# Patient Record
Sex: Male | Born: 1945 | Hispanic: No | Marital: Married | Smoking: Current every day smoker
Health system: Southern US, Community
[De-identification: ages and names within clinical notes are randomized; demographics above are authoritative.]

## PROBLEM LIST (undated history)

## (undated) DIAGNOSIS — D649 Anemia, unspecified: Secondary | ICD-10-CM

## (undated) DIAGNOSIS — N390 Urinary tract infection, site not specified: Secondary | ICD-10-CM

## (undated) DIAGNOSIS — I350 Nonrheumatic aortic (valve) stenosis: Secondary | ICD-10-CM

## (undated) DIAGNOSIS — N400 Enlarged prostate without lower urinary tract symptoms: Secondary | ICD-10-CM

## (undated) DIAGNOSIS — F32A Depression, unspecified: Secondary | ICD-10-CM

## (undated) DIAGNOSIS — F419 Anxiety disorder, unspecified: Secondary | ICD-10-CM

## (undated) DIAGNOSIS — E785 Hyperlipidemia, unspecified: Secondary | ICD-10-CM

## (undated) DIAGNOSIS — I2510 Atherosclerotic heart disease of native coronary artery without angina pectoris: Secondary | ICD-10-CM

## (undated) DIAGNOSIS — I10 Essential (primary) hypertension: Secondary | ICD-10-CM

## (undated) DIAGNOSIS — Z950 Presence of cardiac pacemaker: Secondary | ICD-10-CM

## (undated) DIAGNOSIS — I351 Nonrheumatic aortic (valve) insufficiency: Secondary | ICD-10-CM

## (undated) DIAGNOSIS — M1990 Unspecified osteoarthritis, unspecified site: Secondary | ICD-10-CM

## (undated) DIAGNOSIS — I639 Cerebral infarction, unspecified: Secondary | ICD-10-CM

## (undated) HISTORY — DX: Hyperlipidemia, unspecified: E78.5

## (undated) HISTORY — DX: Nonrheumatic aortic (valve) stenosis: I35.0

## (undated) HISTORY — DX: Nonrheumatic aortic (valve) insufficiency: I35.1

## (undated) HISTORY — DX: Cerebral infarction, unspecified: I63.9

## (undated) HISTORY — DX: Benign prostatic hyperplasia without lower urinary tract symptoms: N40.0

## (undated) HISTORY — DX: Essential (primary) hypertension: I10

---

## 2005-12-19 DIAGNOSIS — I639 Cerebral infarction, unspecified: Secondary | ICD-10-CM

## 2005-12-19 HISTORY — DX: Cerebral infarction, unspecified: I63.9

## 2006-01-01 ENCOUNTER — Inpatient Hospital Stay (HOSPITAL_COMMUNITY): Admission: EM | Admit: 2006-01-01 | Discharge: 2006-01-03 | Payer: Self-pay | Admitting: *Deleted

## 2014-04-18 DIAGNOSIS — I351 Nonrheumatic aortic (valve) insufficiency: Secondary | ICD-10-CM

## 2014-04-18 HISTORY — DX: Nonrheumatic aortic (valve) insufficiency: I35.1

## 2017-04-06 ENCOUNTER — Other Ambulatory Visit: Payer: Self-pay | Admitting: Family Medicine

## 2017-04-06 DIAGNOSIS — Z136 Encounter for screening for cardiovascular disorders: Secondary | ICD-10-CM

## 2017-04-13 ENCOUNTER — Encounter: Payer: Self-pay | Admitting: Cardiology

## 2017-04-13 ENCOUNTER — Ambulatory Visit (INDEPENDENT_AMBULATORY_CARE_PROVIDER_SITE_OTHER): Payer: Medicare Other | Admitting: Cardiology

## 2017-04-13 DIAGNOSIS — I10 Essential (primary) hypertension: Secondary | ICD-10-CM

## 2017-04-13 DIAGNOSIS — I4510 Unspecified right bundle-branch block: Secondary | ICD-10-CM | POA: Diagnosis not present

## 2017-04-13 DIAGNOSIS — R011 Cardiac murmur, unspecified: Secondary | ICD-10-CM | POA: Diagnosis not present

## 2017-04-13 DIAGNOSIS — I350 Nonrheumatic aortic (valve) stenosis: Secondary | ICD-10-CM | POA: Insufficient documentation

## 2017-04-13 HISTORY — DX: Nonrheumatic aortic (valve) stenosis: I35.0

## 2017-04-13 NOTE — Patient Instructions (Signed)
SCHEDULE AT [address]  [address] Your physician has requested that you have an echocardiogram. Echocardiography is a painless test that uses sound waves to create images of your heart. It provides your doctor with information about the size and shape of your heart and how well your heart's chambers and valves are working. This procedure takes approximately one hour. There are no restrictions for this procedure.     NO OTHER CHANGES    Your physician recommends that you schedule a follow-up appointment in 1 MONTH WITH DR HARDING.

## 2017-04-13 NOTE — Progress Notes (Signed)
PCP: No primary care provider on file.  Clinic Note: Chief Complaint  Patient presents with  . New Patient (Initial Visit)    Pt states no Sx.   . Abnormal ECG  . Heart Murmur    HPI: Kyle Matthews is a 71 y.o. male who is being seen today for the evaluation of Abn EKG & Heart Murmur at the request of Kyle Palmer, MD.  Kyle Matthews was seen on April 19 by Dr. Paulino Rily for an initial clinic visit. He had an EKG that was read as abnormal LAFB.  Recent Hospitalizations: None  Studies Reviewed: Clinic EKG reviewed   Rate: 79 ;  Rhythm: normal sinus rhythm and left axis deviation (-39 is borderline LAFB but not truly), RBBB repolarization changes.;   Narrative Interpretation: No prior study.  Interval History: Kennen presents here today for cardiac evaluation for an abnormal EKG without any significant symptoms as well as a murmur. He is currently pending a ultrasound for AAA. He knowledge is that he doesn't do any routine exercise, but is somewhat active. He denies any significant resting or exertional chest tightness or pressure. No PND, orthopnea or edema. No lightheadedness, dizziness, wooziness or syncope/near-syncope, TIA/amaurosis fugax. He's not felt any irregular rapid heartbeats either fast or slow. He is not aware that any embolic source for his stroke in 2007 was found. He apparently also had a TIA back in 98. No claudication.  ROS: A comprehensive was performed. Review of Systems  Constitutional: Negative for malaise/fatigue.  HENT: Negative for congestion and sinus pain.   Respiratory: Negative for shortness of breath and wheezing.   Gastrointestinal: Negative for blood in stool, heartburn and melena.  Genitourinary: Negative for hematuria.  Musculoskeletal: Negative for joint pain.  Skin: Negative.   Neurological: Negative for dizziness, focal weakness, seizures and loss of consciousness.  Psychiatric/Behavioral: Negative for depression and memory loss. The patient is  not nervous/anxious and does not have insomnia.   All other systems reviewed and are negative.   I have reviewed and (if needed) updated the patient's problem list, medications, allergies, past medical and surgical history, social and family history.  Past Medical History:  Diagnosis Date  . BPH (benign prostatic hyperplasia)    Status post biopsy in 2009. - w/ LUTS  . Essential hypertension   . Heart murmur   . Hyperlipidemia    On Crestor - followed by PCP  . Stroke [facility]) 2007    History reviewed. No pertinent surgical history.  Current Meds  Medication Sig  . amLODipine-atorvastatin (CADUET) 10-40 MG tablet Take 1 tablet by mouth daily.  Marland Kitchen aspirin 325 MG tablet Take 325 mg by mouth daily.  . Multiple Vitamins-Minerals (MULTIVITAL PO) Take by mouth daily.  . rosuvastatin (CRESTOR) 40 MG tablet Take 40 mg by mouth daily.    Allergies  Allergen Reactions  . Tetanus Toxoids     Social History   Social History  . Marital status: Married    Spouse name: N/A  . Number of children: 1  . Years of education: N/A   Occupational History  . Retired     Previously worked in Community education officer   Social History Main Topics  . Smoking status: Current Every Day Smoker    Packs/day: 1.50    Years: 24.00    Types: Cigarettes  . Smokeless tobacco: Never Used  . Alcohol use 3.6 oz/week    6 Cans of beer per week  . Drug use: No  . Sexual activity: Not Currently  Other Topics Concern  . None   Social History Narrative   Married father of 1. Lives with his wife of 49 years. He is a retired Marine scientist used to work for [employer]   4 cups of coffee a day.    No structured exercise regimen       family history includes Cancer in his maternal grandmother and paternal grandmother; Coronary artery disease (age of onset: 30) in his mother; Healthy (age of onset: 68) in his brother; Heart disease (age of onset: 6) in his maternal grandfather; Lung cancer in his father;  Pancreatic cancer (age of onset: 43) in his sister; Stroke in his paternal grandfather; Thyroid disease in his sister.  Wt Readings from Last 3 Encounters:  04/13/17 169 lb (76.7 kg)    PHYSICAL EXAM BP 137/77   Pulse 76   Ht  (1.6 m)   Wt 169 lb (76.7 kg)   BMI 29.94 kg/m  General appearance: alert, cooperative, appears stated age, no distress and Borderline obese HEENT: [place]/AT, EOMI, MMM, anicteric sclera Neck: no adenopathy, no carotid bruit and no JVD Lungs: clear to auscultation bilaterally, normal percussion bilaterally and non-labored Heart: regular rate and rhythm, S1 and S2 normal, no click, rub or gallop ; nondistended PMI. 3/6 SEM at upper sternal border radiates toward carotids. Also heard throughout. Somewhat early peaking C-D. Abdomen: soft, non-tender; bowel sounds normal; no masses,  no organomegaly; no HJR Extremities: extremities normal, atraumatic, no cyanosis, or edema Pulses: 2+ and symmetric;  Skin: mobility and turgor normal, no evidence of bleeding or bruising, no lesions noted and temperature normal Neurologic: Mental status: Alert, oriented, thought content appropriate; pleasant mood and affect Cranial nerves: normal (II-XII grossly intact)    Adult ECG Report Not checked today  Other studies Reviewed: Additional studies/ records that were reviewed today include:  Recent Labs:  None available   ASSESSMENT / PLAN: Problem List Items Addressed This Visit    Essential hypertension (Chronic)    Blood pressure is well-controlled today he is on amlodipine. It looks like his medications list to statins with Caduet having, patient of amlodipine and/or statin along with rosuvastatin separately. Will need to clarify with him on follow-up appointment.      Relevant Medications   amLODipine-atorvastatin (CADUET) 10-40 MG tablet   rosuvastatin (CRESTOR) 40 MG tablet   aspirin 325 MG tablet   RBBB    RBBB with borderline LAFB/LAD. These are conduction  abnormalities that I explained to him in detail as far as AV nodal to ventricular conduction. I explained that right bundle branch block is a usually a benign finding. LAFB in the setting of ischemic symptoms is concerning for ischemia, however at present with no symptoms, it is likely not ischemic. Plan for now recheck 2-D echo to evaluate for any structural abnormalities. If he were to have any significant symptoms of exertional chest discomfort or dyspnea or his echo is abnormal with regional wall motion abnormality is, we would then want to do an ischemic evaluation such as a coronary CTA/calcium score.      Relevant Medications   amLODipine-atorvastatin (CADUET) 10-40 MG tablet   rosuvastatin (CRESTOR) 40 MG tablet   aspirin 325 MG tablet   Other Relevant Orders   ECHOCARDIOGRAM COMPLETE   Systolic ejection murmur    The location of the murmur would and further is probably an aortic murmur. Relatively early peaking without the Lipitor for which suggested is probably not significant however he is far as  I can tell he is not had a prior echocardiogram to tell us his anatomy. Plan: Check 2-D echocardiogram         Current medicines are reviewed at length with the patient today. (+/- concerns) n/a The following changes have been made: n/a  Patient Instructions  SCHEDULE AT [address]  [address] Your physician has requested that you have an echocardiogram. Echocardiography is a painless test that uses sound waves to create images of your heart. It provides your doctor with information about the size and shape of your heart and how well your heart's chambers and valves are working. This procedure takes approximately one hour. There are no restrictions for this procedure.     NO OTHER CHANGES    Your physician recommends that you schedule a follow-up appointment in 1 MONTH WITH DR Colbie Sliker.    Studies Ordered:   Orders Placed This Encounter  Procedures  .  ECHOCARDIOGRAM COMPLETE      Bryan Lemma, M.D., M.S. Interventional Cardiologist   Pager # [phone] Phone # [phone] [address] [place], [place] [address]

## 2017-04-15 ENCOUNTER — Encounter: Payer: Self-pay | Admitting: Cardiology

## 2017-04-15 DIAGNOSIS — I10 Essential (primary) hypertension: Secondary | ICD-10-CM | POA: Insufficient documentation

## 2017-04-15 NOTE — Assessment & Plan Note (Signed)
The location of the murmur would and further is probably an aortic murmur. Relatively early peaking without the Lipitor for which suggested is probably not significant however he is far as I can tell he is not had a prior echocardiogram to tell us his anatomy. Plan: Check 2-D echocardiogram

## 2017-04-15 NOTE — Assessment & Plan Note (Signed)
RBBB with borderline LAFB/LAD. These are conduction abnormalities that I explained to him in detail as far as AV nodal to ventricular conduction. I explained that right bundle branch block is a usually a benign finding. LAFB in the setting of ischemic symptoms is concerning for ischemia, however at present with no symptoms, it is likely not ischemic. Plan for now recheck 2-D echo to evaluate for any structural abnormalities. If he were to have any significant symptoms of exertional chest discomfort or dyspnea or his echo is abnormal with regional wall motion abnormality is, we would then want to do an ischemic evaluation such as a coronary CTA/calcium score.

## 2017-04-15 NOTE — Assessment & Plan Note (Signed)
Blood pressure is well-controlled today he is on amlodipine. It looks like his medications list to statins with Caduet having, patient of amlodipine and/or statin along with rosuvastatin separately. Will need to clarify with him on follow-up appointment.

## 2017-04-17 ENCOUNTER — Ambulatory Visit
Admission: RE | Admit: 2017-04-17 | Discharge: 2017-04-17 | Disposition: A | Discharge status: Home / Self Care | Payer: Medicare Other | Source: Ambulatory Visit | Attending: Family Medicine | Admitting: Family Medicine

## 2017-04-17 DIAGNOSIS — Z136 Encounter for screening for cardiovascular disorders: Secondary | ICD-10-CM

## 2017-04-18 DIAGNOSIS — I350 Nonrheumatic aortic (valve) stenosis: Secondary | ICD-10-CM

## 2017-04-18 HISTORY — DX: Nonrheumatic aortic (valve) stenosis: I35.0

## 2017-04-28 ENCOUNTER — Ambulatory Visit (HOSPITAL_COMMUNITY): Payer: Medicare Other | Attending: Cardiology

## 2017-04-28 ENCOUNTER — Other Ambulatory Visit: Payer: Self-pay

## 2017-04-28 DIAGNOSIS — I4510 Unspecified right bundle-branch block: Secondary | ICD-10-CM | POA: Insufficient documentation

## 2017-04-28 DIAGNOSIS — R9431 Abnormal electrocardiogram [ECG] [EKG]: Secondary | ICD-10-CM | POA: Insufficient documentation

## 2017-04-28 DIAGNOSIS — I352 Nonrheumatic aortic (valve) stenosis with insufficiency: Secondary | ICD-10-CM | POA: Insufficient documentation

## 2017-04-28 HISTORY — PX: TRANSTHORACIC ECHOCARDIOGRAM: SHX275

## 2017-05-01 ENCOUNTER — Telehealth: Payer: Self-pay | Admitting: *Deleted

## 2017-05-01 NOTE — Telephone Encounter (Addendum)
-----   Message from [name], MD sent at 04/30/2017  6:06 PM EDT ----- Echo shows essentially upper normal EF of C5-70% with no wall motion abnormalities and only grade 1 diastolic dysfunction. There was suggestion of at least moderate aortic stenosis with moderate regurgitation --> more significant than I thought it would be. We will need to monitor this closely, and blood pressure control is vitally important.  [name], MD  Left message for pt to call

## 2017-05-01 NOTE — Telephone Encounter (Signed)
Spoke with pt, aware of echo results. 

## 2017-05-19 ENCOUNTER — Ambulatory Visit (INDEPENDENT_AMBULATORY_CARE_PROVIDER_SITE_OTHER): Payer: Medicare Other | Admitting: Cardiology

## 2017-05-19 VITALS — BP 148/77 | HR 73 | Ht 63.0 in | Wt 167.8 lb

## 2017-05-19 DIAGNOSIS — I4510 Unspecified right bundle-branch block: Secondary | ICD-10-CM | POA: Diagnosis not present

## 2017-05-19 DIAGNOSIS — R0989 Other specified symptoms and signs involving the circulatory and respiratory systems: Secondary | ICD-10-CM | POA: Diagnosis not present

## 2017-05-19 DIAGNOSIS — I350 Nonrheumatic aortic (valve) stenosis: Secondary | ICD-10-CM | POA: Diagnosis not present

## 2017-05-19 DIAGNOSIS — Z720 Tobacco use: Secondary | ICD-10-CM

## 2017-05-19 DIAGNOSIS — E785 Hyperlipidemia, unspecified: Secondary | ICD-10-CM | POA: Diagnosis not present

## 2017-05-19 DIAGNOSIS — I10 Essential (primary) hypertension: Secondary | ICD-10-CM

## 2017-05-19 MED ORDER — CARVEDILOL 3.125 MG PO TABS: 3.1250 mg | ORAL_TABLET | Freq: Two times a day (BID) | ORAL | 6 refills | Status: DC

## 2017-05-19 NOTE — Patient Instructions (Signed)
SCHEDULE AT [address] Your physician has requested that you have a carotid duplex. This test is an ultrasound of the carotid arteries in your neck. It looks at blood flow through these arteries that supply the brain with blood. Allow one hour for this exam. There are no restrictions or special instructions.    SCHEDULE AT [address] FOR MAY 2019 Your physician has requested that you have an echocardiogram. Echocardiography is a painless test that uses sound waves to create images of your heart. It provides your doctor with information about the size and shape of your heart and how well your heart's chambers and valves are working. This procedure takes approximately one hour. There are no restrictions for this procedure.   MEDICATION ADDITION START COREG  ( CARVEDILOL) 3.125 MG ONE TABLET TWICE A DAY  Your physician recommends that you schedule a follow-up appointment in 3-4 MONTHS WITH B. STRADER PA.    Your physician wants you to follow-up in 12 MONTHS WITH DR HARDING AFTER ECHO IS COMPLETED. You will receive a reminder letter in the mail two months in advance. If you don't receive a letter, please call our office to schedule the follow-up appointment.  If you need a refill on your cardiac medications before your next appointment, please call your pharmacy.

## 2017-05-19 NOTE — Progress Notes (Signed)
PCP: Mila PalmerWolters, Sharon, MD  Clinic Note: Chief Complaint  Patient presents with  . Follow-up    1 mo rov. discuss echo.  . Cardiac Valve Problem    New diagnosis moderate AI/AS    HPI: Kyle Matthews is a 71 y.o. male with a PMH below who presents today for One-month follow-up to evaluate A newly noted heart murmur and LAFB. Echocardiogram showed moderate aortic stenosis with moderate regurgitation  Kyle Matthews was Initially seen on 04/13/2017 - his EKG did not show LAFB at that time but he did have a right bundle branch block. He was evaluated with cardiogram noted below which showed moderate aortic stenosis with moderate regurgitation.  Recent Hospitalizations: None  Studies Personally Reviewed - (if available, images/films reviewed: From Epic Chart or Care Everywhere)  2-D echo 04/28/2017: EF 65-70%. GR 1 DD. No regional wall motion abnormality. Moderate aortic stenosis with moderate regurgitation. Mean gradient 34 mmHg.  Abdominal aorta ultrasound: No evidence of AAA  Interval History: Kyle Matthews presents today for follow-up really relatively asymptomatic. He has a slight rash on both legs that is somewhat punctate in nature. He has not had any new medications, and thinks it may be from working out in the yard. He has not had any significant chest tightness or pressure with rest or exertion. No PND, orthopnea or edema. No palpitations, lightheadedness, dizziness, weakness or syncope/near syncope. No TIA/amaurosis fugax symptoms. No claudication.  ROS: A comprehensive was performed. Review of Systems  Constitutional: Negative for malaise/fatigue.  Respiratory: Negative for cough, shortness of breath and wheezing.   Musculoskeletal: Negative for joint pain and myalgias.  Skin: Positive for rash (Bilateral lower extremity fine punctate erythematous).  Neurological: Negative for dizziness and focal weakness.  Psychiatric/Behavioral: Negative for depression and memory loss. The patient is  not nervous/anxious.   All other systems reviewed and are negative.  I have reviewed and (if needed) personally updated the patient's problem list, medications, allergies, past medical and surgical history, social and family history.   Past Medical History:  Diagnosis Date  . BPH (benign prostatic hyperplasia)    Status post biopsy in 2009. - w/ LUTS  . Essential hypertension   . Hyperlipidemia    On Crestor - followed by PCP  . Moderate aortic regurgitation 04/2014   Moderate AS and AR - on echocardiogram  . 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 Stroke [facility] 2007   No true etiology noted    Past Surgical History:  Procedure Laterality Date  . TRANSTHORACIC ECHOCARDIOGRAM  04/28/2017    EF 65-70%. GR 1 DD. No regional wall motion abnormality. Moderate aortic stenosis with moderate regurgitation. Mean gradient 34 mmHg.    Current Meds  Medication Sig  . amLODipine-benazepril (LOTREL) 10-40 MG capsule Take 1 capsule by mouth daily.  Marland Kitchen. aspirin 325 MG tablet Take 325 mg by mouth daily.  . Multiple Vitamins-Minerals (MULTIVITAL PO) Take by mouth daily.  . rosuvastatin (CRESTOR) 40 MG tablet Take 40 mg by mouth daily.    Allergies  Allergen Reactions  . Tetanus Toxoids     Social History   Social History  . Marital status: Married    Spouse name: N/A  . Number of children: 1  . Years of education: N/A   Occupational History  . Retired     Previously worked in Community education officerinsurance   Social History Main Topics  . Smoking status: Current Every Day Smoker    Packs/day: 1.50    Years: 24.00    Types:  Cigarettes  . Smokeless tobacco: Never Used  . Alcohol use 3.6 oz/week    6 Cans of beer per week  . Drug use: No  . Sexual activity: Not Currently   Other Topics Concern  . None   Social History Narrative   Married father of 1. Lives with his wife of 49 years. He is a retired Marine scientist used to work for [employer]   4 cups of coffee a day.    No  structured exercise regimen       family history includes Cancer in his maternal grandmother and paternal grandmother; Coronary artery disease (age of onset: 63) in his mother; Healthy (age of onset: 8) in his brother; Heart disease (age of onset: 44) in his maternal grandfather; Lung cancer in his father; Pancreatic cancer (age of onset: 19) in his sister; Stroke in his paternal grandfather; Thyroid disease in his sister.  Wt Readings from Last 3 Encounters:  05/19/17 167 lb 12.8 oz (76.1 kg)  04/13/17 169 lb (76.7 kg)    PHYSICAL EXAM BP (!) 148/77   Pulse 73   Ht 5\' 3"  (1.6 m)   Wt 167 lb 12.8 oz (76.1 kg)   BMI 29.72 kg/m  General appearance: alert, cooperative, appears stated age, no distress. Borderline obese. Well nourished, well groomed HEENT: [place]/AT, EOMI, MMM, anicteric sclera Neck: no adenopathy, no JVD. She has what seems to be irradiated aortic murmur in both carotids, however cannot exclude carotid bruit. Lungs: clear to auscultation bilaterally, normal percussion bilaterally and non-labored; carotid upstroke is not delayed Heart: RRR with normal S1 and split S2. Harsh mid peaking 3/6 SEM at RUSB radiating to both carotids. Also noted is a soft 1/6 DM heard at the sternal border. Abdomen: soft, non-tender; bowel sounds normal; no masses,  no organomegaly; no HJR  Extremities: extremities normal, atraumatic, no cyanosis, and edema - none.; Bilateral lower extremities fine erythematous papular rash on both lower extremity from knee down. Pulses: 2+ and symmetric; 2 Neurologic: Mental status: Alert & oriented x 3, thought content appropriate; non-focal exam.  Pleasant mood & affect.   Adult ECG Report n/a  Other studies Reviewed: Additional studies/ records that were reviewed today include:  Recent Labs:  n/a  ASSESSMENT / PLAN: Problem List Items Addressed This Visit    Carotid bruit    Unable to tell if this is a true carotid bruit or pretreated aortic murmur. We  will order carotid Dopplers to clarify.      Relevant Orders   VAS US CAROTID   Dyslipidemia, goal LDL below 70 (Chronic)    Given that the pathophysiology for aortic valve disease is essentially same as apples carotid disease, his goal now would be the same LDL level as would be the goal for CAD. On Crestor. Monitored by PCP.      Relevant Medications   amLODipine-benazepril (LOTREL) 10-40 MG capsule   carvedilol (COREG) 3.125 MG tablet   Essential hypertension (Chronic)    With aortic regurgitation, getting MAS, we need adequate blood pressure control. Plan: I confirm that he is on amlodipine-that has a peripheral (calcium channel blocker-ACE inhibitor) combination medication.   Plan: add carvedilol starting at 3.25 mg twice a day for blood pressure and rate control.      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 -  Discussed the pathophysiology. He is on statin for this anemia. We are adding carvedilol for blood pressure control. He is on full dose aspirin because of prior stroke. -> Will need to determine from neurology if this can be adjusted back down to 81 mg.  Plan: Recheck echo in roughly one year.      Relevant Medications   amLODipine-benazepril (LOTREL) 10-40 MG capsule   carvedilol (COREG) 3.125 MG tablet   Other Relevant Orders   ECHOCARDIOGRAM COMPLETE   RBBB    This may be related to the aortic valvular disease. In the absence of any ischemic symptoms, we will hold off on any ischemic evaluation. No wall motion abnormality is on echocardiogram to suggest prior MI.      Relevant Medications   amLODipine-benazepril (LOTREL) 10-40 MG capsule   carvedilol (COREG) 3.125 MG tablet   Tobacco use (Chronic)    I spent a few minutes talking to him about the importance of smoking  cessation. Smoking cessation instruction/counseling given:  counseled patient on the dangers of tobacco use, advised patient to stop smoking, and reviewed strategies to maximize success         Current medicines are reviewed at length with the patient today. (+/- concerns) n/a The following changes have been made: Added Carvediolol.   Patient Instructions  SCHEDULE AT [address] [facility] [address] Your physician has requested that you have a carotid duplex. This test is an ultrasound of the carotid arteries in your neck. It looks at blood flow through these arteries that supply the brain with blood. Allow one hour for this exam. There are no restrictions or special instructions.    SCHEDULE AT [address] FOR MAY 2019 Your physician has requested that you have an echocardiogram. Echocardiography is a painless test that uses sound waves to create images of your heart. It provides your doctor with information about the size and shape of your heart and how well your heart's chambers and valves are working. This procedure takes approximately one hour. There are no restrictions for this procedure.   MEDICATION ADDITION START COREG  ( CARVEDILOL) 3.125 MG ONE TABLET TWICE A DAY  Your physician recommends that you schedule a follow-up appointment in 3-4 MONTHS WITH B. STRADER PA.    Your physician wants you to follow-up in 12 MONTHS WITH DR Kito Cuffe AFTER ECHO IS COMPLETED. You will receive a reminder letter in the mail two months in advance. If you don't receive a letter, please call our office to schedule the follow-up appointment.  If you need a refill on your cardiac medications before your next appointment, please call your pharmacy.    Studies Ordered:   Orders Placed This Encounter  Procedures  . ECHOCARDIOGRAM COMPLETE      Bryan Lemma, M.D., M.S. Interventional Cardiologist   Pager # [phone] Phone # [phone] [address] [place], [place] [address]

## 2017-05-20 ENCOUNTER — Encounter: Payer: Self-pay | Admitting: Cardiology

## 2017-05-20 DIAGNOSIS — Z720 Tobacco use: Secondary | ICD-10-CM | POA: Insufficient documentation

## 2017-05-20 DIAGNOSIS — F17210 Nicotine dependence, cigarettes, uncomplicated: Secondary | ICD-10-CM | POA: Insufficient documentation

## 2017-05-20 DIAGNOSIS — E785 Hyperlipidemia, unspecified: Secondary | ICD-10-CM | POA: Insufficient documentation

## 2017-05-20 NOTE — Assessment & Plan Note (Signed)
Unable to tell if this is a true carotid bruit or pretreated aortic murmur. We will order carotid Dopplers to clarify.

## 2017-05-20 NOTE — Assessment & Plan Note (Signed)
Given that the pathophysiology for aortic valve disease is essentially same as apples carotid disease, his goal now would be the same LDL level as would be the goal for CAD. On Crestor. Monitored by PCP.

## 2017-05-20 NOTE — Assessment & Plan Note (Signed)
I spent a few minutes talking to him about the importance of smoking cessation. Smoking cessation instruction/counseling given:  counseled patient on the dangers of tobacco use, advised patient to stop smoking, and reviewed strategies to maximize success

## 2017-05-20 NOTE — Assessment & Plan Note (Signed)
With aortic regurgitation, getting MAS, we need adequate blood pressure control. Plan: I confirm that he is on amlodipine-that has a peripheral (calcium channel blocker-ACE inhibitor) combination medication.   Plan: add carvedilol starting at 3.25 mg twice a day for blood pressure and rate control.

## 2017-05-20 NOTE — Assessment & Plan Note (Signed)
Unexpectedly moderate AS with AR. - Discussed the pathophysiology. He is on statin for this anemia. We are adding carvedilol for blood pressure control. He is on full dose aspirin because of prior stroke. -> Will need to determine from neurology if this can be adjusted back down to 81 mg.  Plan: Recheck echo in roughly one year.

## 2017-05-20 NOTE — Assessment & Plan Note (Signed)
This may be related to the aortic valvular disease. In the absence of any ischemic symptoms, we will hold off on any ischemic evaluation. No wall motion abnormality is on echocardiogram to suggest prior MI.

## 2017-06-09 ENCOUNTER — Ambulatory Visit (HOSPITAL_COMMUNITY)
Admission: RE | Admit: 2017-06-09 | Discharge: 2017-06-09 | Disposition: A | Discharge status: Home / Self Care | Payer: Medicare Other | Source: Ambulatory Visit | Attending: Internal Medicine | Admitting: Internal Medicine

## 2017-06-09 DIAGNOSIS — I6523 Occlusion and stenosis of bilateral carotid arteries: Secondary | ICD-10-CM | POA: Diagnosis not present

## 2017-06-09 DIAGNOSIS — Z87891 Personal history of nicotine dependence: Secondary | ICD-10-CM | POA: Insufficient documentation

## 2017-06-09 DIAGNOSIS — Z8673 Personal history of transient ischemic attack (TIA), and cerebral infarction without residual deficits: Secondary | ICD-10-CM | POA: Diagnosis not present

## 2017-06-09 DIAGNOSIS — E785 Hyperlipidemia, unspecified: Secondary | ICD-10-CM | POA: Insufficient documentation

## 2017-06-09 DIAGNOSIS — R0989 Other specified symptoms and signs involving the circulatory and respiratory systems: Secondary | ICD-10-CM

## 2017-06-09 DIAGNOSIS — I10 Essential (primary) hypertension: Secondary | ICD-10-CM | POA: Diagnosis not present

## 2017-06-19 ENCOUNTER — Telehealth: Payer: Self-pay | Admitting: Cardiology

## 2017-06-19 NOTE — Telephone Encounter (Signed)
Pt would like his Ultra sound results from 06-09-17 please.

## 2017-06-19 NOTE — Telephone Encounter (Signed)
Returned call to patient.Stated he was calling for carotid doppler results.Advised doppler results not available.Message sent to Dr.Harding.

## 2017-06-22 NOTE — Telephone Encounter (Signed)
The study somehow just showed up for me to view. Normal study  Heterogeneous plaque, bilaterally. 1-39% bilateral ICA stenosis. Normal subclavian arteries, bilaterally. Patent vertebral arteries with antegrade flow.  [name], MD

## 2017-06-23 ENCOUNTER — Telehealth: Payer: Self-pay | Admitting: *Deleted

## 2017-06-23 NOTE — Telephone Encounter (Signed)
LEFT MESSAGE TO CALL BACK IN REGARDS TO RESULT SEE OTHER PHONE MESSAGE.

## 2017-06-23 NOTE — Telephone Encounter (Signed)
See previous 06/23/17 telephone note.

## 2017-06-23 NOTE — Telephone Encounter (Signed)
Spoke to patient. Result given . Verbalized understanding  

## 2017-06-23 NOTE — Telephone Encounter (Signed)
-----   Message from [name], MD sent at 06/22/2017 11:39 PM EDT ----- Heterogeneous plaque, bilaterally.  1-39% bilateral ICA stenosis.  Normal subclavian arteries, bilaterally.  Patent vertebral arteries with antegrade flow.  Can recheck as needed. - No significant blockage  [name], MD

## 2017-06-23 NOTE — Telephone Encounter (Signed)
LEFT MESSAGE TO CAL BACK   OTHER NUMBER UNAVAILABLE TO LEAVE MESSAGE -VOICE MAIL NOT SET UP

## 2017-11-19 IMAGING — US US ABDOMINAL AORTA SCREENING AAA
1 series · 14 of 18 positions shown · non-contrast
Comparison: None.

CLINICAL DATA: 70-year-old male with a possible abdominal aortic
aneurysm.

EXAM:
ULTRASOUND OF ABDOMINAL AORTA
TECHNIQUE: Ultrasound examination of the abdominal aorta was performed to
evaluate for abdominal aortic aneurysm.

[Series 1: us abdominal aorta screening aaa · 0.28mm/px · 14 of 18 slices shown]
[im 1/18]
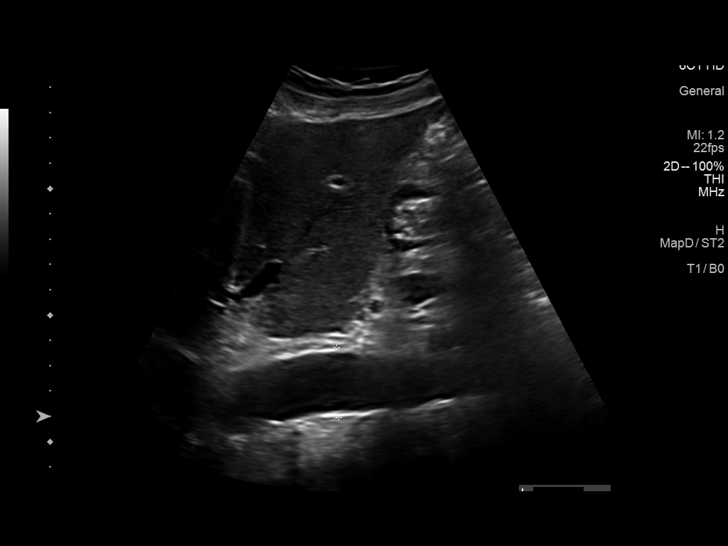
[im 2/18]
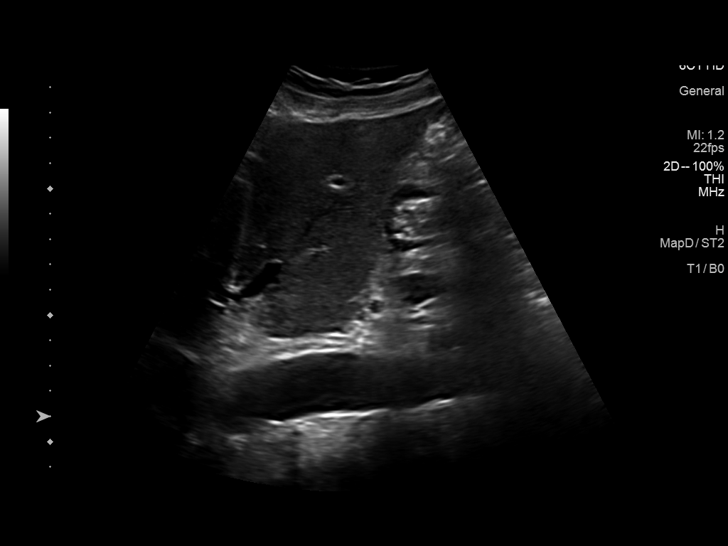
[im 4/18]
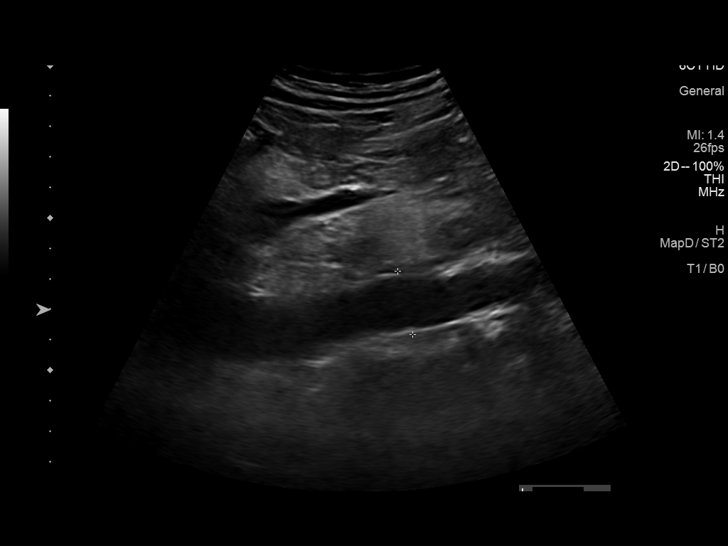
[im 5/18]
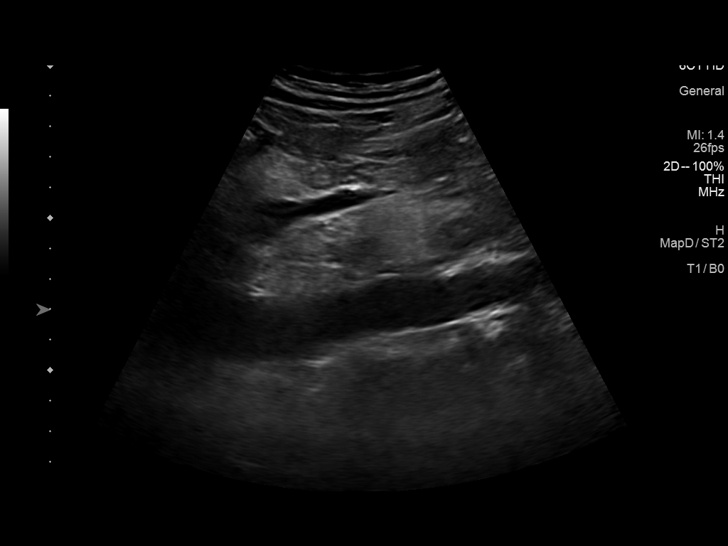
[im 6/18]
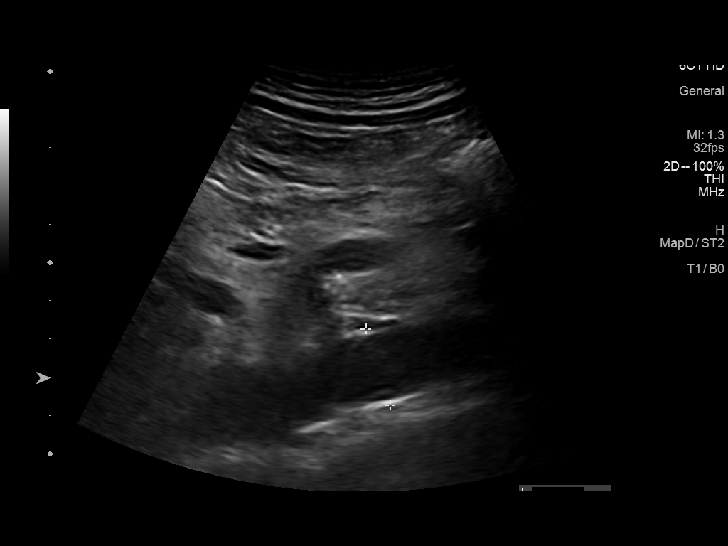
[im 8/18]
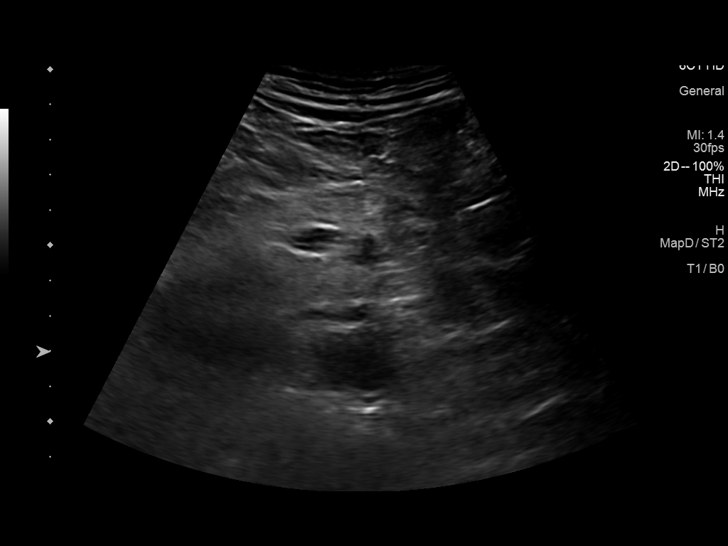
[im 9/18]
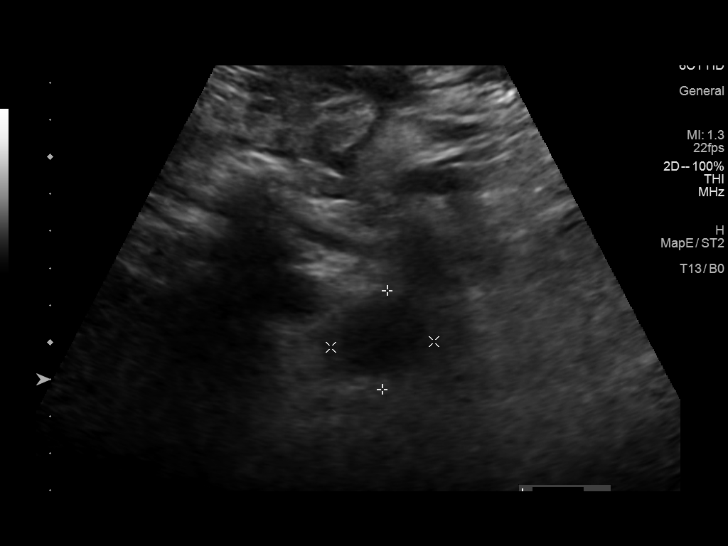
[im 10/18]
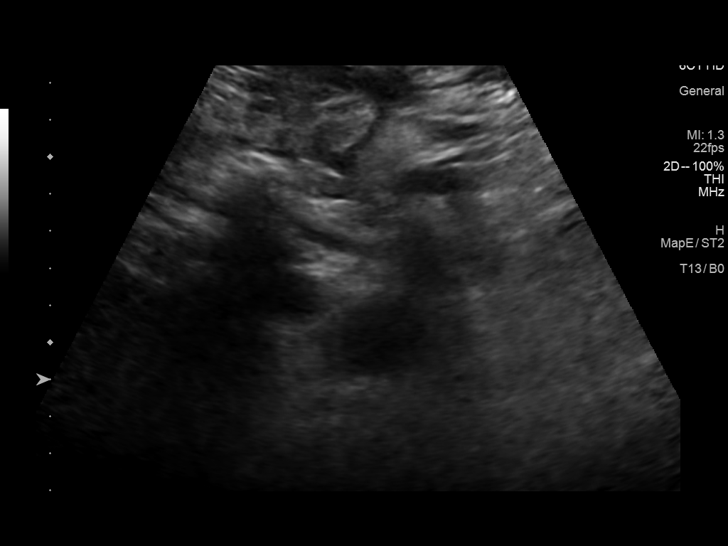
[im 11/18]
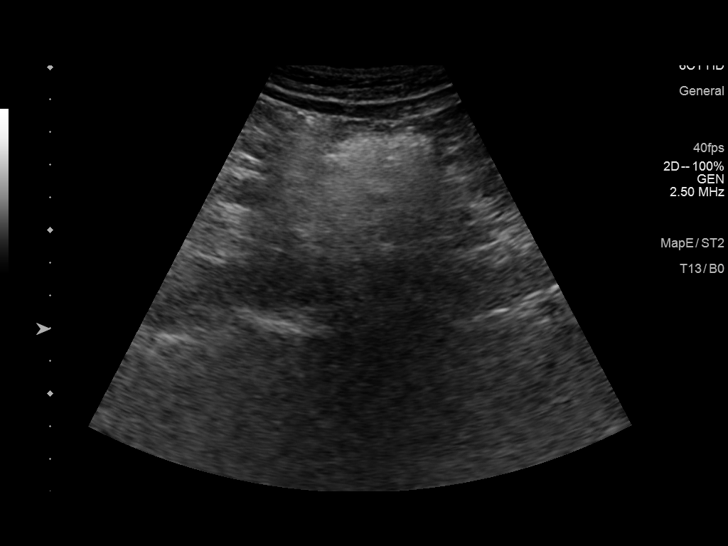
[im 13/18]
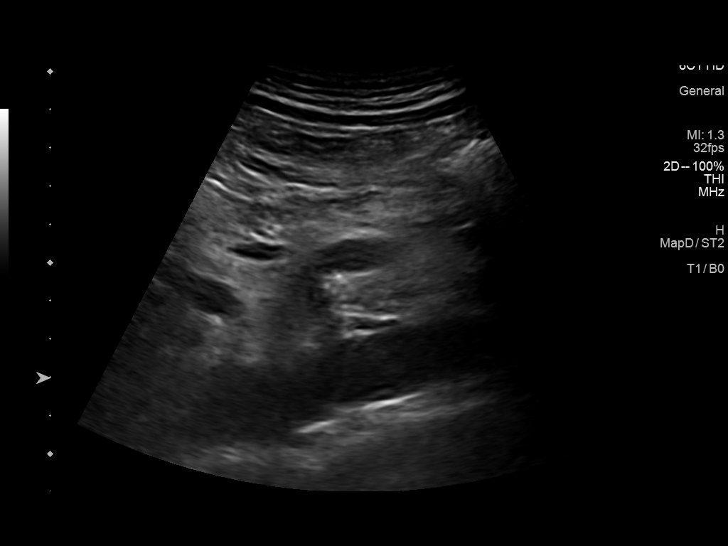
[im 14/18]
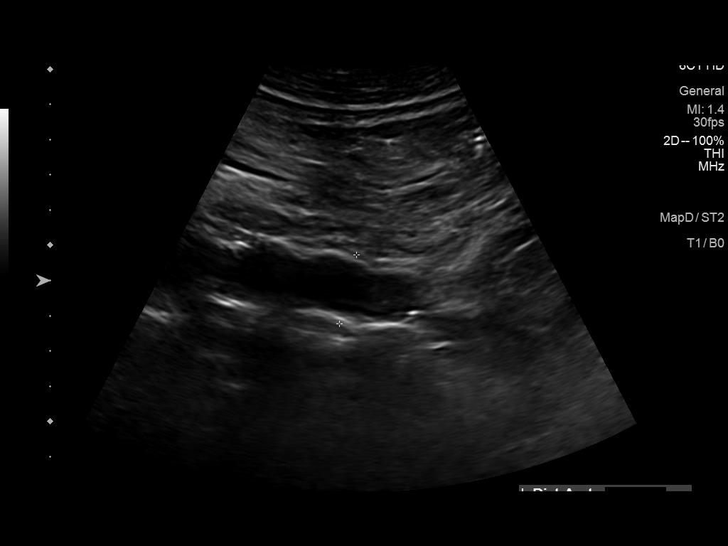
[im 15/18]
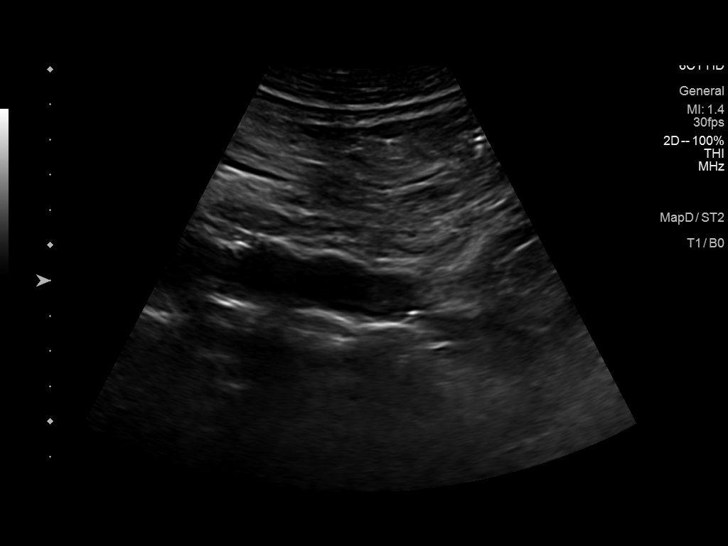
[im 17/18]
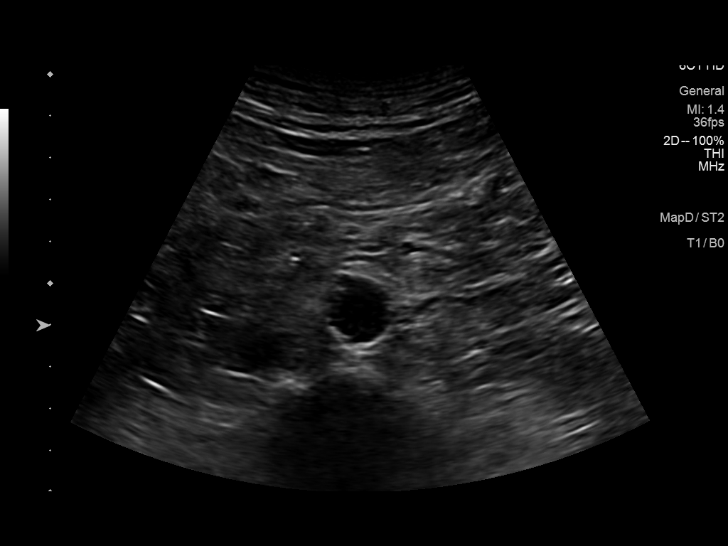
[im 18/18]
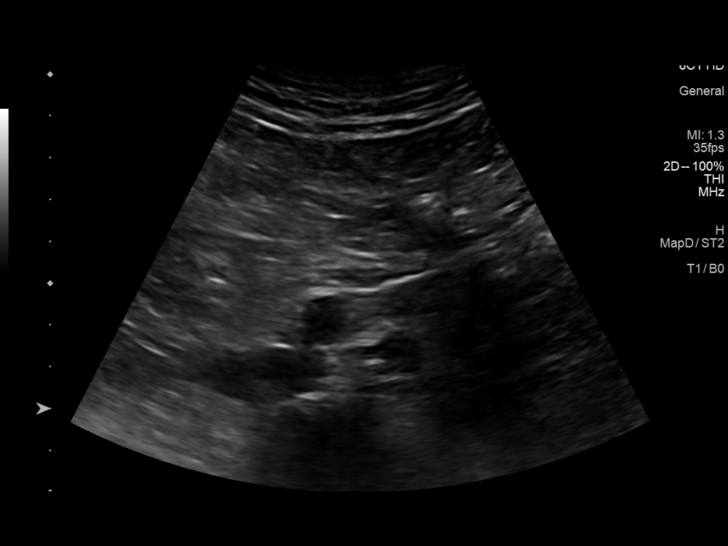

[14 of 18 positions shown; findings below may reference images not displayed]

FINDINGS: Abdominal Aorta

No aneurysm identified.

Maximum Diameter: 2.8 cm proximally, 2.7 cm in the mid segment and
2.0 cm in the distal aorta.
IMPRESSION: Negative for abdominal aortic aneurysm.

## 2018-05-02 ENCOUNTER — Encounter (INDEPENDENT_AMBULATORY_CARE_PROVIDER_SITE_OTHER): Payer: Self-pay

## 2018-05-02 ENCOUNTER — Other Ambulatory Visit: Payer: Self-pay

## 2018-05-02 ENCOUNTER — Ambulatory Visit (HOSPITAL_COMMUNITY): Payer: Medicare Other | Attending: Cardiovascular Disease

## 2018-05-02 DIAGNOSIS — I4510 Unspecified right bundle-branch block: Secondary | ICD-10-CM | POA: Insufficient documentation

## 2018-05-02 DIAGNOSIS — E785 Hyperlipidemia, unspecified: Secondary | ICD-10-CM | POA: Diagnosis not present

## 2018-05-02 DIAGNOSIS — Z720 Tobacco use: Secondary | ICD-10-CM | POA: Insufficient documentation

## 2018-05-02 DIAGNOSIS — I119 Hypertensive heart disease without heart failure: Secondary | ICD-10-CM | POA: Insufficient documentation

## 2018-05-02 DIAGNOSIS — I080 Rheumatic disorders of both mitral and aortic valves: Secondary | ICD-10-CM | POA: Insufficient documentation

## 2018-05-02 DIAGNOSIS — I350 Nonrheumatic aortic (valve) stenosis: Secondary | ICD-10-CM | POA: Diagnosis not present

## 2018-05-03 ENCOUNTER — Telehealth: Payer: Self-pay | Admitting: *Deleted

## 2018-05-03 NOTE — Telephone Encounter (Signed)
LEFT MESSAGE ON BOTH NUMBERS TO CALL BACK----  FOR RESULTS AND SCHEDULE ANNUAL APPOINTMENT

## 2018-05-03 NOTE — Telephone Encounter (Signed)
-----   Message from Marykay Lex, MD sent at 05/02/2018 11:42 PM EDT ----- Echocardiogram results: Basically mild progression of disease, but still moderate aortic stenosis and regurgitation.   Pump function remains 60 to 65% with abnormal relaxation (grade 2 diastolic dysfunction).  This would be something that we would continue to follow-up annually.  We do not start thinking about valve replacement until severe-critical stenosis.  Bryan Lemma, MD

## 2018-05-04 NOTE — Telephone Encounter (Signed)
lmtcb 05/04/18

## 2018-05-09 NOTE — Telephone Encounter (Signed)
Results given to pt. Pt verbalized understanding.  

## 2018-05-09 NOTE — Telephone Encounter (Signed)
Follow up    Patient calling for echo results 

## 2019-11-29 ENCOUNTER — Other Ambulatory Visit: Payer: Self-pay

## 2019-11-29 DIAGNOSIS — Z20822 Contact with and (suspected) exposure to covid-19: Secondary | ICD-10-CM

## 2019-11-30 LAB — NOVEL CORONAVIRUS, NAA: SARS-CoV-2, NAA: NOT DETECTED

## 2019-12-04 ENCOUNTER — Telehealth: Payer: Self-pay | Admitting: *Deleted

## 2019-12-04 NOTE — Telephone Encounter (Signed)
Reviewed negative Covid19 results.No further questions.

## 2020-02-21 ENCOUNTER — Encounter: Payer: Self-pay | Admitting: General Practice

## 2021-04-23 DIAGNOSIS — H6120 Impacted cerumen, unspecified ear: Secondary | ICD-10-CM | POA: Diagnosis not present

## 2021-04-23 DIAGNOSIS — J019 Acute sinusitis, unspecified: Secondary | ICD-10-CM | POA: Diagnosis not present

## 2021-04-29 DIAGNOSIS — H6122 Impacted cerumen, left ear: Secondary | ICD-10-CM | POA: Diagnosis not present

## 2021-09-09 DIAGNOSIS — Z79899 Other long term (current) drug therapy: Secondary | ICD-10-CM | POA: Diagnosis not present

## 2021-09-09 DIAGNOSIS — E782 Mixed hyperlipidemia: Secondary | ICD-10-CM | POA: Diagnosis not present

## 2021-09-09 DIAGNOSIS — I10 Essential (primary) hypertension: Secondary | ICD-10-CM | POA: Diagnosis not present

## 2021-09-09 DIAGNOSIS — R195 Other fecal abnormalities: Secondary | ICD-10-CM | POA: Diagnosis not present

## 2021-09-09 DIAGNOSIS — Z0000 Encounter for general adult medical examination without abnormal findings: Secondary | ICD-10-CM | POA: Diagnosis not present

## 2021-09-09 DIAGNOSIS — I4510 Unspecified right bundle-branch block: Secondary | ICD-10-CM | POA: Diagnosis not present

## 2021-09-09 DIAGNOSIS — Z23 Encounter for immunization: Secondary | ICD-10-CM | POA: Diagnosis not present

## 2021-09-09 DIAGNOSIS — I350 Nonrheumatic aortic (valve) stenosis: Secondary | ICD-10-CM | POA: Diagnosis not present

## 2021-09-09 DIAGNOSIS — I6523 Occlusion and stenosis of bilateral carotid arteries: Secondary | ICD-10-CM | POA: Diagnosis not present

## 2021-09-09 DIAGNOSIS — Z8673 Personal history of transient ischemic attack (TIA), and cerebral infarction without residual deficits: Secondary | ICD-10-CM | POA: Diagnosis not present

## 2021-12-06 DIAGNOSIS — U071 COVID-19: Secondary | ICD-10-CM | POA: Diagnosis not present

## 2021-12-06 DIAGNOSIS — J069 Acute upper respiratory infection, unspecified: Secondary | ICD-10-CM | POA: Diagnosis not present

## 2021-12-21 DIAGNOSIS — Z20822 Contact with and (suspected) exposure to covid-19: Secondary | ICD-10-CM | POA: Diagnosis not present

## 2022-01-24 DIAGNOSIS — Z03818 Encounter for observation for suspected exposure to other biological agents ruled out: Secondary | ICD-10-CM | POA: Diagnosis not present

## 2022-01-24 DIAGNOSIS — J069 Acute upper respiratory infection, unspecified: Secondary | ICD-10-CM | POA: Diagnosis not present

## 2022-09-27 DIAGNOSIS — I4510 Unspecified right bundle-branch block: Secondary | ICD-10-CM | POA: Diagnosis not present

## 2022-09-27 DIAGNOSIS — Z8673 Personal history of transient ischemic attack (TIA), and cerebral infarction without residual deficits: Secondary | ICD-10-CM | POA: Diagnosis not present

## 2022-09-27 DIAGNOSIS — Z23 Encounter for immunization: Secondary | ICD-10-CM | POA: Diagnosis not present

## 2022-09-27 DIAGNOSIS — R195 Other fecal abnormalities: Secondary | ICD-10-CM | POA: Diagnosis not present

## 2022-09-27 DIAGNOSIS — I350 Nonrheumatic aortic (valve) stenosis: Secondary | ICD-10-CM | POA: Diagnosis not present

## 2022-09-27 DIAGNOSIS — I6523 Occlusion and stenosis of bilateral carotid arteries: Secondary | ICD-10-CM | POA: Diagnosis not present

## 2022-09-27 DIAGNOSIS — Z79899 Other long term (current) drug therapy: Secondary | ICD-10-CM | POA: Diagnosis not present

## 2022-09-27 DIAGNOSIS — E782 Mixed hyperlipidemia: Secondary | ICD-10-CM | POA: Diagnosis not present

## 2022-09-27 DIAGNOSIS — I10 Essential (primary) hypertension: Secondary | ICD-10-CM | POA: Diagnosis not present

## 2022-09-27 DIAGNOSIS — Z0000 Encounter for general adult medical examination without abnormal findings: Secondary | ICD-10-CM | POA: Diagnosis not present

## 2022-09-30 ENCOUNTER — Other Ambulatory Visit (HOSPITAL_COMMUNITY): Payer: Self-pay | Admitting: Family Medicine

## 2022-09-30 DIAGNOSIS — I350 Nonrheumatic aortic (valve) stenosis: Secondary | ICD-10-CM

## 2022-10-09 DIAGNOSIS — F17210 Nicotine dependence, cigarettes, uncomplicated: Secondary | ICD-10-CM | POA: Diagnosis not present

## 2022-10-09 DIAGNOSIS — I639 Cerebral infarction, unspecified: Secondary | ICD-10-CM | POA: Diagnosis not present

## 2022-10-09 DIAGNOSIS — I10 Essential (primary) hypertension: Secondary | ICD-10-CM | POA: Diagnosis not present

## 2022-10-09 DIAGNOSIS — I6381 Other cerebral infarction due to occlusion or stenosis of small artery: Secondary | ICD-10-CM | POA: Diagnosis not present

## 2022-10-09 DIAGNOSIS — Z79899 Other long term (current) drug therapy: Secondary | ICD-10-CM | POA: Diagnosis not present

## 2022-10-09 DIAGNOSIS — R030 Elevated blood-pressure reading, without diagnosis of hypertension: Secondary | ICD-10-CM | POA: Diagnosis not present

## 2022-10-09 DIAGNOSIS — E785 Hyperlipidemia, unspecified: Secondary | ICD-10-CM | POA: Diagnosis not present

## 2022-10-09 DIAGNOSIS — Z8673 Personal history of transient ischemic attack (TIA), and cerebral infarction without residual deficits: Secondary | ICD-10-CM | POA: Diagnosis not present

## 2022-10-09 DIAGNOSIS — I272 Pulmonary hypertension, unspecified: Secondary | ICD-10-CM | POA: Diagnosis not present

## 2022-10-09 DIAGNOSIS — I6782 Cerebral ischemia: Secondary | ICD-10-CM | POA: Diagnosis not present

## 2022-10-09 DIAGNOSIS — R29818 Other symptoms and signs involving the nervous system: Secondary | ICD-10-CM | POA: Diagnosis not present

## 2022-10-09 DIAGNOSIS — G459 Transient cerebral ischemic attack, unspecified: Secondary | ICD-10-CM | POA: Diagnosis not present

## 2022-10-09 DIAGNOSIS — R29898 Other symptoms and signs involving the musculoskeletal system: Secondary | ICD-10-CM | POA: Diagnosis not present

## 2022-10-09 DIAGNOSIS — Z7902 Long term (current) use of antithrombotics/antiplatelets: Secondary | ICD-10-CM | POA: Diagnosis not present

## 2022-10-09 DIAGNOSIS — Z7982 Long term (current) use of aspirin: Secondary | ICD-10-CM | POA: Diagnosis not present

## 2022-10-09 DIAGNOSIS — I080 Rheumatic disorders of both mitral and aortic valves: Secondary | ICD-10-CM | POA: Diagnosis not present

## 2022-10-09 DIAGNOSIS — F17200 Nicotine dependence, unspecified, uncomplicated: Secondary | ICD-10-CM | POA: Diagnosis not present

## 2022-10-10 DIAGNOSIS — E785 Hyperlipidemia, unspecified: Secondary | ICD-10-CM | POA: Diagnosis not present

## 2022-10-10 DIAGNOSIS — G459 Transient cerebral ischemic attack, unspecified: Secondary | ICD-10-CM | POA: Diagnosis not present

## 2022-10-10 DIAGNOSIS — I10 Essential (primary) hypertension: Secondary | ICD-10-CM | POA: Diagnosis not present

## 2022-10-10 DIAGNOSIS — Z8673 Personal history of transient ischemic attack (TIA), and cerebral infarction without residual deficits: Secondary | ICD-10-CM | POA: Diagnosis not present

## 2022-10-12 ENCOUNTER — Encounter (HOSPITAL_COMMUNITY): Payer: Self-pay

## 2022-10-12 ENCOUNTER — Other Ambulatory Visit (HOSPITAL_COMMUNITY): Payer: Medicare Other

## 2022-10-20 DIAGNOSIS — Z8673 Personal history of transient ischemic attack (TIA), and cerebral infarction without residual deficits: Secondary | ICD-10-CM | POA: Diagnosis not present

## 2022-10-20 DIAGNOSIS — I350 Nonrheumatic aortic (valve) stenosis: Secondary | ICD-10-CM | POA: Diagnosis not present

## 2022-10-24 NOTE — Progress Notes (Unsigned)
Cardiology Office Note:    Date:  10/25/2022   ID:  [name], DOB 1945-12-25, MRN [id number]  PCP:  Mila Palmer, MD   [facility] Providers Cardiologist:  Maisie Fus, MD     Referring MD: Mila Palmer, MD   Chief Complaint  Patient presents with   New Patient (Initial Visit)  Aortic Stenosis  History of Present Illness:    Kyle Matthews is a 76 y.o. male with a hx of BPH, HTN,  Saw Dr. Herbie Baltimore 05/19/2017. Had RBBB. Had an echo showing moderate AS (mean gradient 34 mmHg) and moderate AI. EF 65-70%. Had possible carotid bruit, no obstruction on the BL carotid dopplers. Echo 05/02/2018 - normal EF,  essentially unchanged.  He notes his left leg was acutely weak. Couldn't walk a straight line. Within a couple of hours he felt better. Had ?TIA 10/10/2022. Echo showed moderate AI, severe AS. CTA head and neck was unremarkable. Brain Mri shows chronic lacunar infarcts.   No LH, dizziness or syncope. No palpitations. No hx of afib  Blood pressure high today. 152/64 mmHg  Social Hx: smoking, here with his wife   Cardiology Studies:  TTE at [facility] 10/09/2022: EF 65-70%, moderate AI, severe AS  mean gradient 43 mmHg, AVA 0.9cm2  TTE 05/02/2018- EF 60-65%, RV fxn,  moderate to severe AS mean gradient 36 mmHg, Valve area 1.18 cm2, DI 0.34, moderate AI  Past Medical History:  Diagnosis Date   BPH (benign prostatic hyperplasia)    Status post biopsy in 2009. 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 GR 1 DD. No regional wall motion abnormality. Moderate aortic stenosis with moderate regurgitation. Mean gradient 34 mmHg.    Current  Medications: Current Meds  Medication Sig   amLODipine-benazepril (LOTREL) 10-40 MG capsule Take 1 capsule by mouth daily.   aspirin 325 MG tablet Take 325 mg by mouth daily.   Multiple Vitamins-Minerals (MULTIVITAL PO) Take by mouth daily.   rosuvastatin (CRESTOR) 40 MG tablet Take 40 mg by mouth daily.     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 Lives with his wife of 49 years. He is a retired Marine scientist used to work for [employer]   4 cups of  coffee a day.    No structured exercise regimen   Social Determinants of Health   Financial Resource Strain: Not on file  Food Insecurity: Not on file  Transportation Needs: Not on file  Physical Activity: Not on file  Stress: Not on file  Social Connections: Not on file     Family History: The patient's family history includes Cancer in his maternal grandmother and paternal grandmother; Coronary artery disease (age of onset: 30) in his mother; Healthy (age of onset: 31) in his brother; Heart disease (age of onset: 67) in his maternal grandfather; Lung cancer in his father; Pancreatic cancer (age of onset: 80) in his sister; Stroke in his paternal grandfather; Thyroid disease in his sister.  ROS:   Please see the history of present  illness.     All other systems reviewed and are negative.  EKGs/Labs/Other Studies Reviewed:    The following studies were reviewed today:   EKG:  EKG is  ordered today.  The ekg ordered today demonstrates   11/7- NSR, RBBB, LAFB  Recent Labs: No results found for requested labs within last 365 days.   Recent Lipid Panel No results found for: "CHOL", "TRIG", "HDL", "CHOLHDL", "VLDL", "LDLCALC", "LDLDIRECT"   Risk Assessment/Calculations:         Physical Exam:    VS:    Vitals:   10/25/22 1328  BP: (!) 152/64  Pulse: 75  SpO2: 97%     Wt Readings from Last 3 Encounters:  10/25/22 155 lb 12.8 oz (70.7 kg)  05/19/17 167 lb 12.8 oz (76.1 kg)  04/13/17 169 lb (76.7 kg)     GEN:  Well nourished, well developed in no acute distress HEENT: Normal NECK: No JVD; No carotid bruits LYMPHATICS: No lymphadenopathy CARDIAC: RRR, 3/6 SEM murmur hearrd throughout radiating to the carotids RESPIRATORY:  Clear to auscultation without rales, wheezing or rhonchi  ABDOMEN: Soft, non-tender, non-distended MUSCULOSKELETAL:  No edema; No deformity  SKIN: Warm and dry NEUROLOGIC:  Alert and oriented x 3 PSYCHIATRIC:  Normal affect   ASSESSMENT:   HTN: amlodipine-benzapril 10-40 MG. Was not taking coreg 3.125 mg BID, will start  Severe AS: Asymptomatic. will continue to monitor with surveillance . Could be a surgical candidate or can consider TAVI. He may require PPM with conduction disease. We discussed this. Will plan for CT aorta  on his next visit and establish with the structural heart team  HLD: continue crestor 40 mg daily  TIA?: unclear if had stroke based on symptoms. Noted brief leg weakness. He's on asa and statin. No prior afib. MRI noted lacunar infarcts, likely related to HTN.  Smoke cessation: working on it PLAN:    In order of problems listed above:  Follow up 6 months            Medication Adjustments/Labs and Tests Ordered: Current medicines are  reviewed at length with the patient today.  Concerns regarding medicines are outlined above.  Orders Placed This Encounter  Procedures   EKG 12-Lead   Meds ordered this encounter  Medications   carvedilol (COREG) 3.125 MG tablet    Sig: Take 1 tablet (3.125 mg total) by mouth 2 (two) times daily.    Dispense:  180 tablet    Refill:  3    Patient Instructions  Medication Instructions:  START: CARVEDILOL 3.125mg  TWICE DAILY  *If you need a refill on your cardiac medications before your next appointment, please call your pharmacy*  Lab Work: None Ordered At  This Time.  If you have labs (blood work) drawn today and your tests are completely normal, you will receive your results only by: MyChart Message (if you have MyChart) OR A paper copy in the mail If you have any lab test that is abnormal or we need to change your treatment, we will call you to review the results.  Testing/Procedures: None Ordered At This Time.   Follow-Up: At [facility], you and your health needs are our priority.  As part of our continuing mission to provide you with exceptional heart care, we have created designated Provider Care Teams.  These Care Teams include your primary Cardiologist (physician) and Advanced Practice Providers (APPs -  Physician Assistants and Nurse Practitioners) who all work together to provide you with the care you need, when you need it.  Your next appointment:   6 month(s)  The format for your next appointment:   In Person  Provider:   Maisie Fus, MD            Signed, Maisie Fus, MD  10/25/2022 3:55 PM    [facility]

## 2022-10-25 ENCOUNTER — Encounter: Payer: Self-pay | Admitting: Internal Medicine

## 2022-10-25 ENCOUNTER — Ambulatory Visit: Payer: Medicare Other | Attending: Internal Medicine | Admitting: Internal Medicine

## 2022-10-25 VITALS — BP 152/64 | HR 75 | Ht 63.0 in | Wt 155.8 lb

## 2022-10-25 DIAGNOSIS — I350 Nonrheumatic aortic (valve) stenosis: Secondary | ICD-10-CM

## 2022-10-25 MED ORDER — CARVEDILOL 3.125 MG PO TABS: 3.1250 mg | ORAL_TABLET | Freq: Two times a day (BID) | ORAL | 3 refills | Status: DC

## 2022-10-25 NOTE — Patient Instructions (Signed)
Medication Instructions:  START: CARVEDILOL 3.125mg  TWICE DAILY  *If you need a refill on your cardiac medications before your next appointment, please call your pharmacy*  Lab Work: None Ordered At This Time.  If you have labs (blood work) drawn today and your tests are completely normal, you will receive your results only by: [place] (if you have MyChart) OR A paper copy in the mail If you have any lab test that is abnormal or we need to change your treatment, we will call you to review the results.  Testing/Procedures: None Ordered At This Time.   Follow-Up: At [facility], you and your health needs are our priority.  As part of our continuing mission to provide you with exceptional heart care, we have created designated Provider Care Teams.  These Care Teams include your primary Cardiologist (physician) and Advanced Practice Providers (APPs -  Physician Assistants and Nurse Practitioners) who all work together to provide you with the care you need, when you need it.  Your next appointment:   6 month(s)  The format for your next appointment:   In Person  Provider:   Janina Mayo, MD

## 2022-10-26 ENCOUNTER — Other Ambulatory Visit: Payer: Self-pay

## 2022-11-08 ENCOUNTER — Ambulatory Visit: Payer: Medicare Other | Admitting: Internal Medicine

## 2022-12-29 DIAGNOSIS — Z8673 Personal history of transient ischemic attack (TIA), and cerebral infarction without residual deficits: Secondary | ICD-10-CM | POA: Diagnosis not present

## 2022-12-29 DIAGNOSIS — I6523 Occlusion and stenosis of bilateral carotid arteries: Secondary | ICD-10-CM | POA: Diagnosis not present

## 2022-12-29 DIAGNOSIS — Z23 Encounter for immunization: Secondary | ICD-10-CM | POA: Diagnosis not present

## 2022-12-29 DIAGNOSIS — I350 Nonrheumatic aortic (valve) stenosis: Secondary | ICD-10-CM | POA: Diagnosis not present

## 2022-12-29 DIAGNOSIS — I10 Essential (primary) hypertension: Secondary | ICD-10-CM | POA: Diagnosis not present

## 2023-01-30 ENCOUNTER — Other Ambulatory Visit: Payer: Self-pay | Admitting: Urology

## 2023-01-30 DIAGNOSIS — R972 Elevated prostate specific antigen [PSA]: Secondary | ICD-10-CM

## 2023-03-07 ENCOUNTER — Ambulatory Visit
Admission: RE | Admit: 2023-03-07 | Discharge: 2023-03-07 | Disposition: A | Discharge status: Home / Self Care | Payer: Medicare Other | Source: Ambulatory Visit | Attending: Urology | Admitting: Urology

## 2023-03-07 DIAGNOSIS — R972 Elevated prostate specific antigen [PSA]: Secondary | ICD-10-CM

## 2023-03-07 MED ORDER — GADOPICLENOL 0.5 MMOL/ML IV SOLN
10.0000 mL | Freq: Once | INTRAVENOUS | Status: AC | PRN
  Administered 2023-03-07: 10 mL via INTRAVENOUS

## 2023-04-24 ENCOUNTER — Ambulatory Visit: Payer: Medicare Other | Admitting: Internal Medicine

## 2023-04-28 ENCOUNTER — Encounter: Payer: Self-pay | Admitting: Internal Medicine

## 2023-04-28 ENCOUNTER — Ambulatory Visit: Payer: Medicare Other | Attending: Internal Medicine | Admitting: Internal Medicine

## 2023-04-28 VITALS — BP 138/60 | HR 68 | Ht 68.0 in | Wt 152.6 lb

## 2023-04-28 DIAGNOSIS — I350 Nonrheumatic aortic (valve) stenosis: Secondary | ICD-10-CM | POA: Diagnosis not present

## 2023-04-28 NOTE — Patient Instructions (Signed)
Medication Instructions:  No changes *If you need a refill on your cardiac medications before your next appointment, please call your pharmacy*  Testing/Procedures: Your physician has requested that you have an echocardiogram in October. Echocardiography is a painless test that uses sound waves to create images of your heart. It provides your doctor with information about the size and shape of your heart and how well your heart's chambers and valves are working. This procedure takes approximately one hour. There are no restrictions for this procedure. Please do NOT wear cologne, perfume, aftershave, or lotions (deodorant is allowed). Please arrive 15 minutes prior to your appointment time.    Follow-Up: At [facility], you and your health needs are our priority.  As part of our continuing mission to provide you with exceptional heart care, we have created designated Provider Care Teams.  These Care Teams include your primary Cardiologist (physician) and Advanced Practice Providers (APPs -  Physician Assistants and Nurse Practitioners) who all work together to provide you with the care you need, when you need it.  We recommend signing up for the patient portal called "MyChart".  Sign up information is provided on this After Visit Summary.  MyChart is used to connect with patients for Virtual Visits (Telemedicine).  Patients are able to view lab/test results, encounter notes, upcoming appointments, etc.  Non-urgent messages can be sent to your provider as well.   To learn more about what you can do with MyChart, go to [email or web address].    Your next appointment:   6 month(s)  Provider:   Maisie Fus, MD

## 2023-04-28 NOTE — Progress Notes (Signed)
Cardiology Office Note:    Date:  04/28/2023   ID:  Kyle Matthews, DOB May 18, 1946, MRN [id number]  PCP:  Mila Palmer, MD   [facility] Providers Cardiologist:  Maisie Fus, MD     Referring MD: Mila Palmer, MD   No chief complaint on file. Aortic Stenosis  History of Present Illness:    Kyle Matthews is a 77 y.o. male with a hx of BPH, HTN,  Saw Dr. Herbie Baltimore 05/19/2017. Had RBBB. Had an echo showing moderate AS (mean gradient 34 mmHg) and moderate AI. EF 65-70%. Had possible carotid bruit, no obstruction on the BL carotid dopplers. Echo 05/02/2018 - normal EF,  essentially unchanged.  He notes his left leg was acutely weak. Couldn't walk a straight line. Within a couple of hours he felt better. Had ?TIA 10/10/2022. Echo showed moderate AI, severe AS. CTA head and neck was unremarkable. Brain Mri shows chronic lacunar infarcts.   No LH, dizziness or syncope. No palpitations. No hx of afib  Blood pressure high today. 152/64 mmHg  Social Hx: smoking, here with his wife  Interim hx 04/28/2023  Noted had chest tightness for 20 minutes and some nauseous while he was out yesterday. He went home and slept for a few hours. It resolved. He also felt some SOB. No syncope. No issues today.    Past Medical History:  Diagnosis Date   BPH (benign prostatic hyperplasia)    Status post biopsy in 2009. 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 GR 1 DD. No regional wall motion abnormality. Moderate aortic stenosis with moderate regurgitation. Mean gradient 34 mmHg.    Current Medications: Current Meds  Medication Sig   amLODipine-benazepril (LOTREL)  10-40 MG capsule Take 1 capsule by mouth daily.   aspirin 325 MG tablet Take 325 mg by mouth daily.   carvedilol (COREG) 3.125 MG tablet Take 1 tablet (3.125 mg total) by mouth 2 (two) times daily.   Multiple Vitamins-Minerals (MULTIVITAL PO) Take by mouth daily.   rosuvastatin (CRESTOR) 40 MG tablet Take 40 mg by mouth daily.     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 Lives with his wife of 49 years. He is  a retired Marine scientist used to work for [employer]   4 cups of coffee a day.    No structured exercise regimen   Social Determinants of Health   Financial Resource Strain: Not on file  Food Insecurity: Not on file  Transportation Needs: Not on file  Physical Activity: Not on file  Stress: Not on file  Social Connections: Not on file     Family History: The patient's family history includes Cancer in his maternal grandmother and paternal grandmother; Coronary artery disease (age of onset: 78) in his mother; Healthy (age of onset: 69) in his brother; Heart disease (age of onset: 12) in his maternal grandfather; Lung cancer in his father; Pancreatic cancer (age of onset: 39) in his sister; Stroke in his paternal grandfather; Thyroid disease in his  sister.  ROS:   Please see the history of present illness.     All other systems reviewed and are negative.  EKGs/Labs/Other Studies Reviewed:    The following studies were reviewed today:   EKG:  EKG is  ordered today.  The ekg ordered today demonstrates   11/7- NSR, RBBB, LAFB  04/28/2023- NSR, LAFB, RBBB  TTE 04/28/2017: Normal EF, normal RV, moderate AS, moderate AI  TTE 05/02/2018- EF 60-65%, RV fxn,  moderate to severe AS mean gradient 36 mmHg, Valve area 1.18 cm2, DI 0.34, moderate AI  TTE 10/10/2022: Normal LVEF/ RV. Severe AS. Mean gradient 43 mmHg, DI 0.32, Valve area 0.9 cm2, moderate AI  Recent Labs: No results found for requested labs within last 365 days.   Recent Lipid Panel No results found for: "CHOL", "TRIG", "HDL", "CHOLHDL", "VLDL", "LDLCALC", "LDLDIRECT"   Risk Assessment/Calculations:         Physical Exam:    VS:   Vitals:   04/28/23 1602  BP: 138/60  Pulse: 68  SpO2: 95%     Wt Readings from Last 3 Encounters:  04/28/23 152 lb 9.6 oz (69.2 kg)  10/25/22 155 lb 12.8 oz (70.7 kg)  05/19/17 167 lb 12.8 oz (76.1 kg)     GEN:  Well nourished, well developed in no acute distress HEENT: Normal NECK: No JVD; No carotid bruits LYMPHATICS: No lymphadenopathy CARDIAC: RRR, 3/6 SEM murmur hearrd throughout radiating to the carotids RESPIRATORY:  Clear to auscultation without rales, wheezing or rhonchi  ABDOMEN: Soft, non-tender, non-distended MUSCULOSKELETAL:  No edema; No deformity  SKIN: Warm and dry NEUROLOGIC:  Alert and oriented x 3 PSYCHIATRIC:  Normal affect   ASSESSMENT:   HTN: amlodipine-benzapril 10-40 MG. Continue coreg 3.125 mg BID  Severe AS: Diagnosed at [facility]. Asymptomatic. will continue to monitor with surveillance . Could be a surgical candidate or can consider TAVI. He may require PPM with conduction disease. We discussed this.  - discussed if having recurrent symptoms of CP/SOB will plan for LHC, likely RHC too and discuss  whether he should be considered for SAVR/TAVR  HLD: continue crestor 40 mg daily  TIA?: unclear if had stroke based on symptoms. Noted brief leg weakness. He's on asa and statin. No prior afib. MRI noted lacunar infarcts, likely related to HTN.  Smoke cessation: working on it PLAN:    In order of problems listed above:  Follow up 6 months FU TTE 09/2023   Medication Adjustments/Labs and Tests Ordered: Current medicines are reviewed at length with the patient today.  Concerns regarding medicines are outlined above.  Orders Placed This Encounter  Procedures   EKG 12-Lead   ECHOCARDIOGRAM COMPLETE  No orders of the defined types were placed in this encounter.   Patient Instructions  Medication Instructions:  No changes *If you need a refill on your cardiac medications before your next appointment, please call your pharmacy*  Testing/Procedures: Your physician has requested that you have an echocardiogram in October. Echocardiography is a painless test that uses sound waves to create images of your heart. It provides your doctor with information about the size and shape of your heart and how well your heart's chambers and valves are working. This procedure takes approximately one hour. There are no restrictions for this procedure. Please do NOT wear cologne, perfume, aftershave, or lotions (deodorant is allowed). Please arrive 15 minutes prior to your appointment time.    Follow-Up: At [facility], you and your health needs are our priority.  As part of our continuing mission to provide you with exceptional heart care, we have created designated Provider Care Teams.  These Care Teams include your primary Cardiologist (physician) and Advanced Practice Providers (APPs -  Physician Assistants and Nurse Practitioners) who all work together to provide you with the care you need, when you need it.  We recommend signing up for the patient portal called "MyChart".  Sign up  information is provided on this After Visit Summary.  MyChart is used to connect with patients for Virtual Visits (Telemedicine).  Patients are able to view lab/test results, encounter notes, upcoming appointments, etc.  Non-urgent messages can be sent to your provider as well.   To learn more about what you can do with MyChart, go to [email or web address].    Your next appointment:   6 month(s)  Provider:   Maisie Fus, MD       Signed, Maisie Fus, MD  04/28/2023 4:32 PM    [facility]

## 2023-05-29 DIAGNOSIS — R935 Abnormal findings on diagnostic imaging of other abdominal regions, including retroperitoneum: Secondary | ICD-10-CM | POA: Diagnosis not present

## 2023-06-08 DIAGNOSIS — K635 Polyp of colon: Secondary | ICD-10-CM | POA: Diagnosis not present

## 2023-06-08 DIAGNOSIS — R933 Abnormal findings on diagnostic imaging of other parts of digestive tract: Secondary | ICD-10-CM | POA: Diagnosis not present

## 2023-06-08 DIAGNOSIS — K648 Other hemorrhoids: Secondary | ICD-10-CM | POA: Diagnosis not present

## 2023-06-08 DIAGNOSIS — K621 Rectal polyp: Secondary | ICD-10-CM | POA: Diagnosis not present

## 2023-06-08 DIAGNOSIS — K5730 Diverticulosis of large intestine without perforation or abscess without bleeding: Secondary | ICD-10-CM | POA: Diagnosis not present

## 2023-06-13 DIAGNOSIS — K621 Rectal polyp: Secondary | ICD-10-CM | POA: Diagnosis not present

## 2023-06-13 DIAGNOSIS — K635 Polyp of colon: Secondary | ICD-10-CM | POA: Diagnosis not present

## 2023-08-04 DIAGNOSIS — R051 Acute cough: Secondary | ICD-10-CM | POA: Diagnosis not present

## 2023-08-04 DIAGNOSIS — Z03818 Encounter for observation for suspected exposure to other biological agents ruled out: Secondary | ICD-10-CM | POA: Diagnosis not present

## 2023-08-04 DIAGNOSIS — J069 Acute upper respiratory infection, unspecified: Secondary | ICD-10-CM | POA: Diagnosis not present

## 2023-08-17 ENCOUNTER — Other Ambulatory Visit (HOSPITAL_COMMUNITY): Payer: Self-pay | Admitting: Family Medicine

## 2023-08-17 ENCOUNTER — Ambulatory Visit (HOSPITAL_COMMUNITY)
Admission: RE | Admit: 2023-08-17 | Discharge: 2023-08-17 | Disposition: A | Discharge status: Home / Self Care | Payer: Medicare Other | Source: Ambulatory Visit | Attending: Family Medicine | Admitting: Family Medicine

## 2023-08-17 DIAGNOSIS — N189 Chronic kidney disease, unspecified: Secondary | ICD-10-CM | POA: Diagnosis not present

## 2023-08-17 DIAGNOSIS — N1330 Unspecified hydronephrosis: Secondary | ICD-10-CM | POA: Diagnosis not present

## 2023-08-17 DIAGNOSIS — R609 Edema, unspecified: Secondary | ICD-10-CM | POA: Diagnosis not present

## 2023-08-17 DIAGNOSIS — K5730 Diverticulosis of large intestine without perforation or abscess without bleeding: Secondary | ICD-10-CM | POA: Diagnosis not present

## 2023-08-17 DIAGNOSIS — I129 Hypertensive chronic kidney disease with stage 1 through stage 4 chronic kidney disease, or unspecified chronic kidney disease: Secondary | ICD-10-CM | POA: Diagnosis not present

## 2023-08-17 DIAGNOSIS — Z79899 Other long term (current) drug therapy: Secondary | ICD-10-CM | POA: Diagnosis not present

## 2023-08-17 DIAGNOSIS — E785 Hyperlipidemia, unspecified: Secondary | ICD-10-CM | POA: Diagnosis not present

## 2023-08-17 DIAGNOSIS — R600 Localized edema: Secondary | ICD-10-CM | POA: Diagnosis not present

## 2023-08-17 DIAGNOSIS — N139 Obstructive and reflux uropathy, unspecified: Secondary | ICD-10-CM | POA: Diagnosis not present

## 2023-08-17 DIAGNOSIS — R140 Abdominal distension (gaseous): Secondary | ICD-10-CM | POA: Diagnosis not present

## 2023-08-18 DIAGNOSIS — Z8673 Personal history of transient ischemic attack (TIA), and cerebral infarction without residual deficits: Secondary | ICD-10-CM | POA: Diagnosis not present

## 2023-08-18 DIAGNOSIS — Z79899 Other long term (current) drug therapy: Secondary | ICD-10-CM | POA: Diagnosis not present

## 2023-08-18 DIAGNOSIS — Z887 Allergy status to serum and vaccine status: Secondary | ICD-10-CM | POA: Diagnosis not present

## 2023-08-18 DIAGNOSIS — I10 Essential (primary) hypertension: Secondary | ICD-10-CM | POA: Diagnosis not present

## 2023-08-18 DIAGNOSIS — Z7982 Long term (current) use of aspirin: Secondary | ICD-10-CM | POA: Diagnosis not present

## 2023-08-18 DIAGNOSIS — R319 Hematuria, unspecified: Secondary | ICD-10-CM | POA: Diagnosis not present

## 2023-08-18 DIAGNOSIS — E785 Hyperlipidemia, unspecified: Secondary | ICD-10-CM | POA: Diagnosis not present

## 2023-08-18 DIAGNOSIS — Z960 Presence of urogenital implants: Secondary | ICD-10-CM | POA: Diagnosis not present

## 2023-08-22 DIAGNOSIS — R319 Hematuria, unspecified: Secondary | ICD-10-CM | POA: Diagnosis not present

## 2023-08-22 DIAGNOSIS — N179 Acute kidney failure, unspecified: Secondary | ICD-10-CM | POA: Diagnosis not present

## 2023-09-01 DIAGNOSIS — N281 Cyst of kidney, acquired: Secondary | ICD-10-CM | POA: Diagnosis not present

## 2023-09-01 DIAGNOSIS — N130 Hydronephrosis with ureteropelvic junction obstruction: Secondary | ICD-10-CM | POA: Diagnosis not present

## 2023-09-01 DIAGNOSIS — R338 Other retention of urine: Secondary | ICD-10-CM | POA: Diagnosis not present

## 2023-09-07 DIAGNOSIS — N130 Hydronephrosis with ureteropelvic junction obstruction: Secondary | ICD-10-CM | POA: Diagnosis not present

## 2023-09-07 DIAGNOSIS — N281 Cyst of kidney, acquired: Secondary | ICD-10-CM | POA: Diagnosis not present

## 2023-09-12 DIAGNOSIS — R338 Other retention of urine: Secondary | ICD-10-CM | POA: Diagnosis not present

## 2023-09-19 ENCOUNTER — Other Ambulatory Visit: Payer: Self-pay | Admitting: Urology

## 2023-09-19 ENCOUNTER — Telehealth: Payer: Self-pay | Admitting: Internal Medicine

## 2023-09-19 NOTE — Telephone Encounter (Signed)
Name: Kyle Matthews  DOB: [birth year]  MRN: [id number]  Primary Cardiologist: Maisie Fus, MD   Preoperative team, please contact this patient and set up a phone call appointment for further preoperative risk assessment. Please obtain consent and complete medication review. Thank you for your help.  I confirm that guidance regarding antiplatelet and oral anticoagulation therapy has been completed and, if necessary, noted below.  Regarding ASA therapy, it may be stopped 5-7 days prior to surgery with a plan to resume it as soon as felt to be feasible from a surgical standpoint in the post-operative period.   I also confirmed the patient resides in the state of [place]. As per [employer] telemedicine laws, the patient must reside in the state in which the provider is licensed.   Napoleon Form, Leodis Rains, NP 09/19/2023, 11:01 AM [facility]

## 2023-09-19 NOTE — Telephone Encounter (Signed)
Pre-operative Risk Assessment    Patient Name: Kyle Matthews  DOB: 1946/05/29 MRN: [id number]      Request for Surgical Clearance    Procedure:  Robotic prostatectomy Date of Surgery:  10-13-23                 Surgeon:  Sr Sebastian Ache Surgeon's Group or Practice Name:   Phone number:  [phone] Fax number:  [phone]   Type of Clearance Requested:   - Medical  - Pharmacy:  Hold Aspirin for 5 days   Type of Anesthesia:  General    Additional requests/questions:    [name]   09/19/2023, 10:45 AM

## 2023-09-20 ENCOUNTER — Telehealth: Payer: Self-pay | Admitting: *Deleted

## 2023-09-20 NOTE — Telephone Encounter (Signed)
DPR ok to s/w the pt's wife and she has scheduled tele pre op appt for the pt 10/05/23 @ 9 am. Med rec and consent are done.    Pt's wife states to also that the pt is due for an echo that was ordered from Dr. Wyline Mood last yr. She asked if this will need to be needed for the pre op clearance. I informed her that I will check with pre op APP to reach out to Dr. Wyline Mood if echo will needed to be done before pre op clearance.

## 2023-09-20 NOTE — Telephone Encounter (Signed)
Good Afternoon Dr. Wyline Mood  We have received a surgical clearance request for [name].  He has a PMH of moderate AAS and moderate AI with last echo being completed 09/2022.  He is scheduled to have a televisit for clearance and I wanted to check to see if his echo needs to be updated prior to clearance being granted.. Please forward you guidance and recommendations to P CV DIV PREOP   Thank you

## 2023-09-20 NOTE — Telephone Encounter (Signed)
DPR ok to s/w the pt's wife and she has scheduled tele pre op appt for the pt 10/05/23 @ 9 am. Med rec and consent are done.    Pt's wife states to also that the pt is due for an echo that was ordered from Dr. Wyline Mood last yr. She asked if this will need to be needed for the pre op clearance. I informed her that I will check with pre op APP to reach out to Dr. Wyline Mood if echo will needed to be done before pre op clearance.    Patient Consent for Virtual Visit        [employer] Kindt has provided verbal consent on 09/20/2023 for a virtual visit (video or telephone).   CONSENT FOR VIRTUAL VISIT FOR:  Kyle Matthews  By participating in this virtual visit I agree to the following:  I hereby voluntarily request, consent and authorize [facility] and its employed or contracted physicians, physician assistants, nurse practitioners or other licensed health care professionals (the Practitioner), to provide me with telemedicine health care services (the "Services") as deemed necessary by the treating Practitioner. I acknowledge and consent to receive the Services by the Practitioner via telemedicine. I understand that the telemedicine visit will involve communicating with the Practitioner through live audiovisual communication technology and the disclosure of certain medical information by electronic transmission. I acknowledge that I have been given the opportunity to request an in-person assessment or other available alternative prior to the telemedicine visit and am voluntarily participating in the telemedicine visit.  I understand that I have the right to withhold or withdraw my consent to the use of telemedicine in the course of my care at any time, without affecting my right to future care or treatment, and that the Practitioner or I may terminate the telemedicine visit at any time. I understand that I have the right to inspect all information obtained and/or recorded in the course of the telemedicine  visit and may receive copies of available information for a reasonable fee.  I understand that some of the potential risks of receiving the Services via telemedicine include:  Delay or interruption in medical evaluation due to technological equipment failure or disruption; Information transmitted may not be sufficient (e.g. poor resolution of images) to allow for appropriate medical decision making by the Practitioner; and/or  In rare instances, security protocols could fail, causing a breach of personal health information.  Furthermore, I acknowledge that it is my responsibility to provide information about my medical history, conditions and care that is complete and accurate to the best of my ability. I acknowledge that Practitioner's advice, recommendations, and/or decision may be based on factors not within their control, such as incomplete or inaccurate data provided by me or distortions of diagnostic images or specimens that may result from electronic transmissions. I understand that the practice of medicine is not an exact science and that Practitioner makes no warranties or guarantees regarding treatment outcomes. I acknowledge that a copy of this consent can be made available to me via my patient portal [facility] MyChart), or I can request a printed copy by calling the office of [facility].    I understand that my insurance will be billed for this visit.   I have read or had this consent read to me. I understand the contents of this consent, which adequately explains the benefits and risks of the Services being provided via telemedicine.  I have been provided ample opportunity to ask questions regarding  this consent and the Services and have had my questions answered to my satisfaction. I give my informed consent for the services to be provided through the use of telemedicine in my medical care

## 2023-09-23 ENCOUNTER — Emergency Department (HOSPITAL_COMMUNITY)
Admission: EM | Admit: 2023-09-23 | Discharge: 2023-09-23 | Disposition: A | Discharge status: Home / Self Care | Payer: Medicare Other | Attending: Emergency Medicine | Admitting: Emergency Medicine

## 2023-09-23 ENCOUNTER — Other Ambulatory Visit: Payer: Self-pay

## 2023-09-23 ENCOUNTER — Encounter (HOSPITAL_COMMUNITY): Payer: Self-pay | Admitting: Emergency Medicine

## 2023-09-23 DIAGNOSIS — Z7982 Long term (current) use of aspirin: Secondary | ICD-10-CM | POA: Diagnosis not present

## 2023-09-23 DIAGNOSIS — T83091A Other mechanical complication of indwelling urethral catheter, initial encounter: Secondary | ICD-10-CM | POA: Diagnosis not present

## 2023-09-23 DIAGNOSIS — Y732 Prosthetic and other implants, materials and accessory gastroenterology and urology devices associated with adverse incidents: Secondary | ICD-10-CM | POA: Insufficient documentation

## 2023-09-23 DIAGNOSIS — T839XXA Unspecified complication of genitourinary prosthetic device, implant and graft, initial encounter: Secondary | ICD-10-CM

## 2023-09-23 MED ORDER — CEPHALEXIN 500 MG PO CAPS
500.0000 mg | ORAL_CAPSULE | Freq: Once | ORAL | Status: AC
  Administered 2023-09-23: 500 mg via ORAL
  Filled 2023-09-23: qty 1

## 2023-09-23 MED ORDER — CEPHALEXIN 500 MG PO CAPS: 500.0000 mg | ORAL_CAPSULE | Freq: Three times a day (TID) | ORAL | 0 refills | Status: AC

## 2023-09-23 NOTE — Discharge Instructions (Addendum)
Follow-up with the urologist this week, ER for worsening symptoms, drink plenty of clear liquids and take cephalexin exactly as prescribed  Thank you for allowing [place] to treat you in the emergency department today.  After reviewing your examination and potential testing that was done it appears that you are safe to go home.  I would like for you to follow-up with your doctor within the next several days, have them obtain your records and follow-up with them to review all potential tests and results from your visit.  If you should develop severe or worsening symptoms return to the emergency department immediately

## 2023-09-23 NOTE — ED Triage Notes (Signed)
Pt c/o that he noticed today that his foley catheter wasn't draining correctly. Pt states he is scheduled for prostate surgery later this month, and has had some hematuria on and off since the foley was placed.

## 2023-09-23 NOTE — ED Provider Notes (Signed)
[place] EMERGENCY DEPARTMENT AT [facility] Provider Note   CSN: [id number] Arrival date & time: 09/23/23  1850     History  Chief Complaint  Patient presents with   Foley Problem    Kyle Matthews is a 77 y.o. male.  HPI This patient is a 77 year old male with a known history of prostatic hypertrophy awaiting prostate surgery currently with a Foley catheter placed for retention, it was initially placed about a month and a half ago, since that time the patient has had it replaced about a week ago.  He states that today it has had some bloody drainage, no fevers or chills, no nausea or vomiting, he does not think the bag is emptying correctly though he is emptied at 3 times today with urine.    Home Medications Prior to Admission medications   Medication Sig Start Date End Date Taking? Authorizing Provider  cephALEXin (KEFLEX) 500 MG capsule Take 1 capsule (500 mg total) by mouth 3 (three) times daily for 7 days. 09/23/23 09/30/23 Yes Eber Hong, MD  amLODipine-benazepril (LOTREL) 10-40 MG capsule Take 1 capsule by mouth daily. 03/16/17   [provider]  aspirin 325 MG tablet Take 325 mg by mouth daily.    [provider]  carvedilol (COREG) 3.125 MG tablet Take 1 tablet (3.125 mg total) by mouth 2 (two) times daily. 10/25/22   Maisie Fus, MD  cephALEXin (KEFLEX) 500 MG capsule Take 500 mg by mouth 2 (two) times daily. Patient not taking: Reported on 09/20/2023 09/12/23   [provider]  Multiple Vitamins-Minerals (MULTIVITAL PO) Take by mouth daily.    [provider]  rosuvastatin (CRESTOR) 40 MG tablet Take 40 mg by mouth daily.    [provider]  tamsulosin (FLOMAX) 0.4 MG CAPS capsule Take 0.4 mg by mouth daily. 09/01/23   [provider]      Allergies    Tetanus toxoids    Review of Systems   Review of Systems  All other systems reviewed and are negative.   Physical Exam Updated Vital Signs BP (!)  144/63 (BP Location: Right Arm)   Pulse 81   Temp 99.5 F (37.5 C) (Oral)   Resp 16   Ht 1.727 m (5\' 8" )   Wt 69.2 kg   SpO2 96%   BMI 23.20 kg/m  Physical Exam Vitals and nursing note reviewed.  Constitutional:      General: He is not in acute distress.    Appearance: He is well-developed.  HENT:     Head: Normocephalic and atraumatic.     Mouth/Throat:     Pharynx: No oropharyngeal exudate.  Eyes:     General: No scleral icterus.       Right eye: No discharge.        Left eye: No discharge.     Conjunctiva/sclera: Conjunctivae normal.     Pupils: Pupils are equal, round, and reactive to light.  Neck:     Thyroid: No thyromegaly.     Vascular: No JVD.  Cardiovascular:     Rate and Rhythm: Normal rate and regular rhythm.     Heart sounds: Normal heart sounds. No murmur heard.    No friction rub. No gallop.  Pulmonary:     Effort: Pulmonary effort is normal. No respiratory distress.     Breath sounds: Normal breath sounds. No wheezing or rales.  Abdominal:     General: Bowel sounds are normal. There is no distension.  Palpations: Abdomen is soft. There is no mass.     Tenderness: There is no abdominal tenderness.  Genitourinary:    Comments: Normal-appearing penis scrotum and testicles with a Foley catheter well-seated in the urethra, blood in the bag Musculoskeletal:        General: No tenderness. Normal range of motion.     Cervical back: Normal range of motion and neck supple.  Lymphadenopathy:     Cervical: No cervical adenopathy.  Skin:    General: Skin is warm and dry.     Findings: No erythema or rash.  Neurological:     Mental Status: He is alert.     Coordination: Coordination normal.  Psychiatric:        Behavior: Behavior normal.     ED Results / Procedures / Treatments   Labs (all labs ordered are listed, but only abnormal results are displayed) Labs Reviewed - No data to display  EKG None  Radiology No results  found.  Procedures Procedures    Medications Ordered in ED Medications  cephALEXin (KEFLEX) capsule 500 mg (has no administration in time range)    ED Course/ Medical Decision Making/ A&P                                 Medical Decision Making Risk Prescription drug management.   Change Foley New leg bag Culture urine Treat for possible infection Good follow-up with urology        Final Clinical Impression(s) / ED Diagnoses Final diagnoses:  Problem with Foley catheter, initial encounter [facility])    Rx / DC Orders ED Discharge Orders          Ordered    cephALEXin (KEFLEX) 500 MG capsule  3 times daily        09/23/23 2009              Eber Hong, MD 09/23/23 2009

## 2023-10-02 NOTE — Patient Instructions (Signed)
DUE TO COVID-19 ONLY TWO VISITORS  (aged 75 and older)  ARE ALLOWED TO COME WITH YOU AND STAY IN THE WAITING ROOM ONLY DURING PRE OP AND PROCEDURE.   **NO VISITORS ARE ALLOWED IN THE SHORT STAY AREA OR RECOVERY ROOM!!**  IF YOU WILL BE ADMITTED INTO THE HOSPITAL YOU ARE ALLOWED ONLY FOUR SUPPORT PEOPLE DURING VISITATION HOURS ONLY (7 AM -8PM)   The support person(s) must pass our screening, gel in and out, and wear a mask at all times, including in the patient's room. Patients must also wear a mask when staff or their support person are in the room. Visitors GUEST BADGE MUST BE WORN VISIBLY  One adult visitor may remain with you overnight and MUST be in the room by 8 P.M.     Your procedure is scheduled on: 10/13/23   Report to [facility] Main Entrance    Report to admitting at : 8:45 AM   Call this number if you have problems the morning of surgery [phone]   Do not eat food or drink: After Midnight.  FOLLOW BOWEL PREP AND ANY ADDITIONAL PRE OP INSTRUCTIONS YOU RECEIVED FROM YOUR SURGEON'S OFFICE!!!   Oral Hygiene is also important to reduce your risk of infection.                                    Remember - BRUSH YOUR TEETH THE MORNING OF SURGERY WITH YOUR REGULAR TOOTHPASTE  DENTURES WILL BE REMOVED PRIOR TO SURGERY PLEASE DO NOT APPLY "Poly grip" OR ADHESIVES!!!   Do NOT smoke after Midnight   Take these medicines the morning of surgery with A SIP OF WATER: amlodipine.                              You may not have any metal on your body including hair pins, jewelry, and body piercing             Do not wear lotions, powders, perfumes/cologne, or deodorant              Men may shave face and neck.   Do not bring valuables to the hospital. [facility] IS NOT             RESPONSIBLE   FOR VALUABLES.   Contacts, glasses, or bridgework may not be worn into surgery.   Bring small overnight bag day of surgery.   DO NOT BRING YOUR HOME MEDICATIONS TO THE  HOSPITAL. PHARMACY WILL DISPENSE MEDICATIONS LISTED ON YOUR MEDICATION LIST TO YOU DURING YOUR ADMISSION IN THE HOSPITAL!    Patients discharged on the day of surgery will not be allowed to drive home.  Someone NEEDS to stay with you for the first 24 hours after anesthesia.   Special Instructions: Bring a copy of your healthcare power of attorney and living will documents         the day of surgery if you haven't scanned them before.              Please read over the following fact sheets you were given: IF YOU HAVE QUESTIONS ABOUT YOUR PRE-OP INSTRUCTIONS PLEASE CALL [phone]    [facility] - Preparing for Surgery Before surgery, you can play an important role.  Because skin is not sterile, your skin needs to be as free of germs as possible.  You can reduce the number of germs on your skin by washing with CHG (chlorahexidine gluconate) soap before surgery.  CHG is an antiseptic cleaner which kills germs and bonds with the skin to continue killing germs even after washing. Please DO NOT use if you have an allergy to CHG or antibacterial soaps.  If your skin becomes reddened/irritated stop using the CHG and inform your nurse when you arrive at Short Stay. Do not shave (including legs and underarms) for at least 48 hours prior to the first CHG shower.  You may shave your face/neck. Please follow these instructions carefully:  1.  Shower with CHG Soap the night before surgery and the  morning of Surgery.  2.  If you choose to wash your hair, wash your hair first as usual with your  normal  shampoo.  3.  After you shampoo, rinse your hair and body thoroughly to remove the  shampoo.                           4.  Use CHG as you would any other liquid soap.  You can apply chg directly  to the skin and wash                       Gently with a scrungie or clean washcloth.  5.  Apply the CHG Soap to your body ONLY FROM THE NECK DOWN.   Do not use on face/ open                           Wound or open  sores. Avoid contact with eyes, ears mouth and genitals (private parts).                       Wash face,  Genitals (private parts) with your normal soap.             6.  Wash thoroughly, paying special attention to the area where your surgery  will be performed.  7.  Thoroughly rinse your body with warm water from the neck down.  8.  DO NOT shower/wash with your normal soap after using and rinsing off  the CHG Soap.                9.  Pat yourself dry with a clean towel.            10.  Wear clean pajamas.            11.  Place clean sheets on your bed the night of your first shower and do not  sleep with pets. Day of Surgery : Do not apply any lotions/deodorants the morning of surgery.  Please wear clean clothes to the hospital/surgery center.  FAILURE TO FOLLOW THESE INSTRUCTIONS MAY RESULT IN THE CANCELLATION OF YOUR SURGERY PATIENT SIGNATURE_________________________________  NURSE SIGNATURE__________________________________  ________________________________________________________________________

## 2023-10-03 ENCOUNTER — Ambulatory Visit (HOSPITAL_COMMUNITY): Payer: Medicare Other | Attending: Internal Medicine

## 2023-10-03 ENCOUNTER — Encounter (HOSPITAL_COMMUNITY)
Admission: RE | Admit: 2023-10-03 | Discharge: 2023-10-03 | Disposition: A | Discharge status: Home / Self Care | Payer: Medicare Other | Source: Ambulatory Visit | Attending: Anesthesiology | Admitting: Anesthesiology

## 2023-10-03 ENCOUNTER — Other Ambulatory Visit: Payer: Self-pay

## 2023-10-03 ENCOUNTER — Inpatient Hospital Stay (HOSPITAL_COMMUNITY)
Admission: EM | Admit: 2023-10-03 | Discharge: 2023-10-12 | DRG: 280 | Disposition: A | Discharge status: Home Health | Payer: Medicare Other | Attending: Internal Medicine | Admitting: Internal Medicine

## 2023-10-03 ENCOUNTER — Emergency Department (HOSPITAL_COMMUNITY): Payer: Medicare Other

## 2023-10-03 ENCOUNTER — Inpatient Hospital Stay (HOSPITAL_COMMUNITY): Payer: Medicare Other

## 2023-10-03 ENCOUNTER — Encounter (HOSPITAL_COMMUNITY): Payer: Self-pay | Admitting: Emergency Medicine

## 2023-10-03 DIAGNOSIS — Z887 Allergy status to serum and vaccine status: Secondary | ICD-10-CM

## 2023-10-03 DIAGNOSIS — Z8249 Family history of ischemic heart disease and other diseases of the circulatory system: Secondary | ICD-10-CM

## 2023-10-03 DIAGNOSIS — N401 Enlarged prostate with lower urinary tract symptoms: Secondary | ICD-10-CM | POA: Diagnosis present

## 2023-10-03 DIAGNOSIS — R7989 Other specified abnormal findings of blood chemistry: Secondary | ICD-10-CM | POA: Diagnosis not present

## 2023-10-03 DIAGNOSIS — I110 Hypertensive heart disease with heart failure: Secondary | ICD-10-CM | POA: Diagnosis present

## 2023-10-03 DIAGNOSIS — Z978 Presence of other specified devices: Secondary | ICD-10-CM | POA: Insufficient documentation

## 2023-10-03 DIAGNOSIS — D649 Anemia, unspecified: Secondary | ICD-10-CM | POA: Diagnosis not present

## 2023-10-03 DIAGNOSIS — I517 Cardiomegaly: Secondary | ICD-10-CM | POA: Diagnosis not present

## 2023-10-03 DIAGNOSIS — N138 Other obstructive and reflux uropathy: Secondary | ICD-10-CM | POA: Diagnosis present

## 2023-10-03 DIAGNOSIS — R079 Chest pain, unspecified: Secondary | ICD-10-CM | POA: Diagnosis not present

## 2023-10-03 DIAGNOSIS — I252 Old myocardial infarction: Secondary | ICD-10-CM | POA: Diagnosis not present

## 2023-10-03 DIAGNOSIS — R310 Gross hematuria: Secondary | ICD-10-CM | POA: Diagnosis not present

## 2023-10-03 DIAGNOSIS — R319 Hematuria, unspecified: Secondary | ICD-10-CM | POA: Diagnosis not present

## 2023-10-03 DIAGNOSIS — Z792 Long term (current) use of antibiotics: Secondary | ICD-10-CM

## 2023-10-03 DIAGNOSIS — N400 Enlarged prostate without lower urinary tract symptoms: Secondary | ICD-10-CM | POA: Diagnosis present

## 2023-10-03 DIAGNOSIS — I339 Acute and subacute endocarditis, unspecified: Secondary | ICD-10-CM | POA: Diagnosis not present

## 2023-10-03 DIAGNOSIS — N320 Bladder-neck obstruction: Secondary | ICD-10-CM | POA: Diagnosis not present

## 2023-10-03 DIAGNOSIS — I4510 Unspecified right bundle-branch block: Secondary | ICD-10-CM | POA: Diagnosis present

## 2023-10-03 DIAGNOSIS — Z79899 Other long term (current) drug therapy: Secondary | ICD-10-CM

## 2023-10-03 DIAGNOSIS — Z7982 Long term (current) use of aspirin: Secondary | ICD-10-CM

## 2023-10-03 DIAGNOSIS — R338 Other retention of urine: Secondary | ICD-10-CM | POA: Diagnosis present

## 2023-10-03 DIAGNOSIS — Z743 Need for continuous supervision: Secondary | ICD-10-CM | POA: Diagnosis not present

## 2023-10-03 DIAGNOSIS — D62 Acute posthemorrhagic anemia: Secondary | ICD-10-CM | POA: Diagnosis not present

## 2023-10-03 DIAGNOSIS — J90 Pleural effusion, not elsewhere classified: Secondary | ICD-10-CM | POA: Diagnosis not present

## 2023-10-03 DIAGNOSIS — I2510 Atherosclerotic heart disease of native coronary artery without angina pectoris: Secondary | ICD-10-CM | POA: Diagnosis not present

## 2023-10-03 DIAGNOSIS — Z1152 Encounter for screening for COVID-19: Secondary | ICD-10-CM | POA: Diagnosis not present

## 2023-10-03 DIAGNOSIS — R0789 Other chest pain: Secondary | ICD-10-CM

## 2023-10-03 DIAGNOSIS — I080 Rheumatic disorders of both mitral and aortic valves: Secondary | ICD-10-CM | POA: Diagnosis not present

## 2023-10-03 DIAGNOSIS — I359 Nonrheumatic aortic valve disorder, unspecified: Secondary | ICD-10-CM | POA: Diagnosis not present

## 2023-10-03 DIAGNOSIS — Z8673 Personal history of transient ischemic attack (TIA), and cerebral infarction without residual deficits: Secondary | ICD-10-CM | POA: Diagnosis not present

## 2023-10-03 DIAGNOSIS — I351 Nonrheumatic aortic (valve) insufficiency: Secondary | ICD-10-CM | POA: Diagnosis present

## 2023-10-03 DIAGNOSIS — K8020 Calculus of gallbladder without cholecystitis without obstruction: Secondary | ICD-10-CM | POA: Diagnosis not present

## 2023-10-03 DIAGNOSIS — I2583 Coronary atherosclerosis due to lipid rich plaque: Secondary | ICD-10-CM | POA: Diagnosis not present

## 2023-10-03 DIAGNOSIS — R6889 Other general symptoms and signs: Secondary | ICD-10-CM | POA: Diagnosis not present

## 2023-10-03 DIAGNOSIS — J449 Chronic obstructive pulmonary disease, unspecified: Secondary | ICD-10-CM | POA: Diagnosis present

## 2023-10-03 DIAGNOSIS — Z01818 Encounter for other preprocedural examination: Secondary | ICD-10-CM

## 2023-10-03 DIAGNOSIS — D500 Iron deficiency anemia secondary to blood loss (chronic): Secondary | ICD-10-CM | POA: Diagnosis not present

## 2023-10-03 DIAGNOSIS — J9601 Acute respiratory failure with hypoxia: Secondary | ICD-10-CM | POA: Diagnosis not present

## 2023-10-03 DIAGNOSIS — I10 Essential (primary) hypertension: Secondary | ICD-10-CM | POA: Diagnosis present

## 2023-10-03 DIAGNOSIS — I214 Non-ST elevation (NSTEMI) myocardial infarction: Secondary | ICD-10-CM

## 2023-10-03 DIAGNOSIS — I3481 Nonrheumatic mitral (valve) annulus calcification: Secondary | ICD-10-CM | POA: Diagnosis not present

## 2023-10-03 DIAGNOSIS — I352 Nonrheumatic aortic (valve) stenosis with insufficiency: Secondary | ICD-10-CM | POA: Diagnosis present

## 2023-10-03 DIAGNOSIS — N289 Disorder of kidney and ureter, unspecified: Secondary | ICD-10-CM

## 2023-10-03 DIAGNOSIS — I5032 Chronic diastolic (congestive) heart failure: Secondary | ICD-10-CM | POA: Diagnosis present

## 2023-10-03 DIAGNOSIS — Z823 Family history of stroke: Secondary | ICD-10-CM

## 2023-10-03 DIAGNOSIS — I350 Nonrheumatic aortic (valve) stenosis: Secondary | ICD-10-CM

## 2023-10-03 DIAGNOSIS — Z23 Encounter for immunization: Secondary | ICD-10-CM

## 2023-10-03 DIAGNOSIS — R918 Other nonspecific abnormal finding of lung field: Secondary | ICD-10-CM | POA: Diagnosis not present

## 2023-10-03 DIAGNOSIS — J811 Chronic pulmonary edema: Secondary | ICD-10-CM | POA: Diagnosis not present

## 2023-10-03 DIAGNOSIS — I700 Atherosclerosis of aorta: Secondary | ICD-10-CM | POA: Diagnosis not present

## 2023-10-03 DIAGNOSIS — Z48812 Encounter for surgical aftercare following surgery on the circulatory system: Secondary | ICD-10-CM | POA: Diagnosis not present

## 2023-10-03 DIAGNOSIS — I5033 Acute on chronic diastolic (congestive) heart failure: Secondary | ICD-10-CM | POA: Diagnosis present

## 2023-10-03 DIAGNOSIS — J189 Pneumonia, unspecified organism: Secondary | ICD-10-CM | POA: Diagnosis not present

## 2023-10-03 DIAGNOSIS — R0989 Other specified symptoms and signs involving the circulatory and respiratory systems: Secondary | ICD-10-CM | POA: Diagnosis not present

## 2023-10-03 DIAGNOSIS — F17210 Nicotine dependence, cigarettes, uncomplicated: Secondary | ICD-10-CM | POA: Diagnosis present

## 2023-10-03 DIAGNOSIS — Z951 Presence of aortocoronary bypass graft: Secondary | ICD-10-CM

## 2023-10-03 DIAGNOSIS — N281 Cyst of kidney, acquired: Secondary | ICD-10-CM | POA: Diagnosis not present

## 2023-10-03 DIAGNOSIS — R0602 Shortness of breath: Secondary | ICD-10-CM | POA: Insufficient documentation

## 2023-10-03 DIAGNOSIS — Z720 Tobacco use: Secondary | ICD-10-CM | POA: Diagnosis present

## 2023-10-03 DIAGNOSIS — D508 Other iron deficiency anemias: Secondary | ICD-10-CM | POA: Diagnosis not present

## 2023-10-03 DIAGNOSIS — E785 Hyperlipidemia, unspecified: Secondary | ICD-10-CM | POA: Diagnosis not present

## 2023-10-03 DIAGNOSIS — I25110 Atherosclerotic heart disease of native coronary artery with unstable angina pectoris: Secondary | ICD-10-CM | POA: Diagnosis not present

## 2023-10-03 DIAGNOSIS — I499 Cardiac arrhythmia, unspecified: Secondary | ICD-10-CM | POA: Diagnosis not present

## 2023-10-03 LAB — HEMOGLOBIN A1C
Hgb A1c MFr Bld: 4.8 % (ref 4.8–5.6)
Mean Plasma Glucose: 91.06 mg/dL

## 2023-10-03 LAB — TROPONIN I (HIGH SENSITIVITY)
Troponin I (High Sensitivity): 856 ng/L (ref <18)
Troponin I (High Sensitivity): 1395 ng/L (ref <18)
Troponin I (High Sensitivity): 4814 ng/L (ref <18)

## 2023-10-03 LAB — ECHOCARDIOGRAM COMPLETE
AR max vel: 0.93 cm2
AV Area VTI: 0.89 cm2
AV Area mean vel: 0.95 cm2
AV Mean grad: 42 mm[Hg]
AV Peak grad: 69.2 mm[Hg]
Ao pk vel: 4.16 m/s
Area-P 1/2: 3.63 cm2
Calc EF: 59.6 %
P 1/2 time: 227 ms
S' Lateral: 3.2 cm
Single Plane A2C EF: 47.4 %
Single Plane A4C EF: 69 %

## 2023-10-03 LAB — COMPREHENSIVE METABOLIC PANEL
ALT: 15 U/L (ref 0–44)
AST: 24 U/L (ref 15–41)
Albumin: 3 g/dL — ABNORMAL LOW (ref 3.5–5.0)
Alkaline Phosphatase: 52 U/L (ref 38–126)
Anion gap: 7 (ref 5–15)
BUN: 18 mg/dL (ref 8–23)
CO2: 24 mmol/L (ref 22–32)
Calcium: 7.9 mg/dL — ABNORMAL LOW (ref 8.9–10.3)
Chloride: 103 mmol/L (ref 98–111)
Creatinine, Ser: 0.81 mg/dL (ref 0.61–1.24)
GFR, Estimated: >60 mL/min (ref >60)
Glucose, Bld: 125 mg/dL — ABNORMAL HIGH (ref 70–99)
Potassium: 3.6 mmol/L (ref 3.5–5.1)
Sodium: 134 mmol/L — ABNORMAL LOW (ref 135–145)
Total Bilirubin: 0.6 mg/dL (ref 0.3–1.2)
Total Protein: 5.9 g/dL — ABNORMAL LOW (ref 6.5–8.1)

## 2023-10-03 LAB — CBC
HCT: 20.3 % — ABNORMAL LOW (ref 39.0–52.0)
Hemoglobin: 6.5 g/dL — CL (ref 13.0–17.0)
MCH: 28.6 pg (ref 26.0–34.0)
MCHC: 32 g/dL (ref 30.0–36.0)
MCV: 89.4 fL (ref 80.0–100.0)
Platelets: 240 10*3/uL (ref 150–400)
RBC: 2.27 MIL/uL — ABNORMAL LOW (ref 4.22–5.81)
RDW: 14.5 % (ref 11.5–15.5)
WBC: 10.3 10*3/uL (ref 4.0–10.5)
nRBC: 0.2 % (ref 0.0–0.2)

## 2023-10-03 LAB — HEMOGLOBIN AND HEMATOCRIT, BLOOD
HCT: 21.5 % — ABNORMAL LOW (ref 39.0–52.0)
HCT: 21.3 % — ABNORMAL LOW (ref 39.0–52.0)
Hemoglobin: 6.9 g/dL — CL (ref 13.0–17.0)
Hemoglobin: 6.8 g/dL — CL (ref 13.0–17.0)

## 2023-10-03 LAB — PREPARE RBC (CROSSMATCH)

## 2023-10-03 LAB — MRSA NEXT GEN BY PCR, NASAL: MRSA by PCR Next Gen: NOT DETECTED

## 2023-10-03 LAB — ABO/RH: ABO/RH(D): AB POS

## 2023-10-03 LAB — PROTIME-INR
INR: 1.1 (ref 0.8–1.2)
Prothrombin Time: 14.2 s (ref 11.4–15.2)

## 2023-10-03 LAB — BRAIN NATRIURETIC PEPTIDE: B Natriuretic Peptide: 547 pg/mL — ABNORMAL HIGH (ref 0.0–100.0)

## 2023-10-03 MED ORDER — SODIUM CHLORIDE 0.9% IV SOLUTION: Freq: Once | INTRAVENOUS | Status: AC

## 2023-10-03 MED ORDER — ROSUVASTATIN CALCIUM 20 MG PO TABS
40.0000 mg | ORAL_TABLET | Freq: Every evening | ORAL | Status: DC
  Administered 2023-10-03 – 2023-10-11 (×9): 40 mg via ORAL
  Filled 2023-10-03 (×10): qty 2

## 2023-10-03 MED ORDER — IPRATROPIUM-ALBUTEROL 0.5-2.5 (3) MG/3ML IN SOLN
3.0000 mL | Freq: Four times a day (QID) | RESPIRATORY_TRACT | Status: DC
  Administered 2023-10-03 (×3): 3 mL via RESPIRATORY_TRACT
  Filled 2023-10-03 (×3): qty 3

## 2023-10-03 MED ORDER — TRAZODONE HCL 50 MG PO TABS: 25.0000 mg | ORAL_TABLET | Freq: Every evening | ORAL | Status: DC | PRN

## 2023-10-03 MED ORDER — CHLORHEXIDINE GLUCONATE CLOTH 2 % EX PADS
6.0000 | MEDICATED_PAD | Freq: Every day | CUTANEOUS | Status: DC
  Administered 2023-10-03 – 2023-10-12 (×9): 6 via TOPICAL

## 2023-10-03 MED ORDER — ACETAMINOPHEN 325 MG PO TABS: 650.0000 mg | ORAL_TABLET | Freq: Four times a day (QID) | ORAL | Status: DC | PRN

## 2023-10-03 MED ORDER — SODIUM CHLORIDE 0.9 % IV SOLN
2.0000 g | INTRAVENOUS | Status: AC
  Administered 2023-10-04 – 2023-10-07 (×4): 2 g via INTRAVENOUS
  Filled 2023-10-03 (×4): qty 20

## 2023-10-03 MED ORDER — PREDNISONE 20 MG PO TABS
40.0000 mg | ORAL_TABLET | Freq: Every day | ORAL | Status: AC
  Administered 2023-10-04 – 2023-10-08 (×5): 40 mg via ORAL
  Filled 2023-10-03 (×5): qty 2

## 2023-10-03 MED ORDER — SODIUM CHLORIDE 0.9 % IV SOLN
2.0000 g | Freq: Once | INTRAVENOUS | Status: AC
  Administered 2023-10-03: 2 g via INTRAVENOUS
  Filled 2023-10-03: qty 20

## 2023-10-03 MED ORDER — ONDANSETRON HCL 4 MG/2ML IJ SOLN: 4.0000 mg | Freq: Four times a day (QID) | INTRAMUSCULAR | Status: DC | PRN

## 2023-10-03 MED ORDER — METHYLPREDNISOLONE SODIUM SUCC 125 MG IJ SOLR
60.0000 mg | Freq: Two times a day (BID) | INTRAMUSCULAR | Status: AC
  Administered 2023-10-03 (×2): 60 mg via INTRAVENOUS
  Filled 2023-10-03 (×2): qty 2

## 2023-10-03 MED ORDER — IPRATROPIUM-ALBUTEROL 0.5-2.5 (3) MG/3ML IN SOLN
3.0000 mL | Freq: Three times a day (TID) | RESPIRATORY_TRACT | Status: DC
  Administered 2023-10-04: 3 mL via RESPIRATORY_TRACT
  Filled 2023-10-03: qty 3

## 2023-10-03 MED ORDER — BISACODYL 5 MG PO TBEC
5.0000 mg | DELAYED_RELEASE_TABLET | Freq: Every day | ORAL | Status: DC | PRN
  Administered 2023-10-06: 5 mg via ORAL
  Filled 2023-10-03: qty 1

## 2023-10-03 MED ORDER — MORPHINE SULFATE (PF) 2 MG/ML IV SOLN: 1.0000 mg | INTRAVENOUS | Status: DC | PRN

## 2023-10-03 MED ORDER — IOHEXOL 350 MG/ML SOLN
100.0000 mL | Freq: Once | INTRAVENOUS | Status: AC | PRN
  Administered 2023-10-03: 100 mL via INTRAVENOUS

## 2023-10-03 MED ORDER — OXYCODONE HCL 5 MG PO TABS: 5.0000 mg | ORAL_TABLET | ORAL | Status: DC | PRN

## 2023-10-03 MED ORDER — SODIUM CHLORIDE 0.9 % IV SOLN
500.0000 mg | INTRAVENOUS | Status: DC
  Administered 2023-10-03 – 2023-10-05 (×3): 500 mg via INTRAVENOUS
  Filled 2023-10-03 (×3): qty 5

## 2023-10-03 MED ORDER — ACETAMINOPHEN 650 MG RE SUPP: 650.0000 mg | Freq: Four times a day (QID) | RECTAL | Status: DC | PRN

## 2023-10-03 MED ORDER — ONDANSETRON HCL 4 MG PO TABS: 4.0000 mg | ORAL_TABLET | Freq: Four times a day (QID) | ORAL | Status: DC | PRN

## 2023-10-03 MED ORDER — FINASTERIDE 5 MG PO TABS
5.0000 mg | ORAL_TABLET | Freq: Every evening | ORAL | Status: DC
  Administered 2023-10-03 – 2023-10-11 (×9): 5 mg via ORAL
  Filled 2023-10-03 (×9): qty 1

## 2023-10-03 MED ORDER — SODIUM CHLORIDE 0.9 % IV SOLN: 500.0000 mg | INTRAVENOUS | Status: DC

## 2023-10-03 NOTE — Progress Notes (Addendum)
10/03/2023 5:12 PM  [facility]  Repeat troponin >4000, pt transferring to stepdown unit at [facility].  I called and discussed with Dr Wyline Mood.  Ok to transfer to [employer].  Treatment plan remains the same. No plan for invasive procedures, heparin or aspirin at this time.   Pt remains stable, no chest pain symptoms and appears comfortable.  Updated RN and patient placement.   Maryln Manuel MD  How to contact the [facility] Attending or Consulting provider 7A - 7P or covering provider during after hours 7P -7A, for this patient?  Check the care team in [facility] and look for a) attending/consulting TRH provider listed and b) the [facility] team listed Log into [email or web address] and use [place]'s universal password to access. If you do not have the password, please contact the hospital operator. Locate the [facility] provider you are looking for under [facility] and page to a number that you can be directly reached. If you still have difficulty reaching the provider, please page the [facility] (Director on Call) for the Hospitalists listed on amion for assistance.

## 2023-10-03 NOTE — Progress Notes (Signed)
CROSS COVER NOTE  NAME: Kyle Matthews MRN: [id number] DOB : November 22, 1946    Date of Service   10/03/2023   HPI/Events of Note   Notified by RN for Hgb of 6.8 g/dl.  On chart review pt was admitted with anemia and transfused wit 1 unit of PRBCs at [facility] prior to transfer to [facility]. No report of active bleeding at this time. CBC    Component Value Date/Time   WBC 10.3 10/03/2023 0412   RBC 2.27 (L) 10/03/2023 0412   HGB 6.8 (LL) 10/03/2023 2032   HCT 21.3 (L) 10/03/2023 2032   PLT 240 10/03/2023 0412   MCV 89.4 10/03/2023 0412   MCH 28.6 10/03/2023 0412   MCHC 32.0 10/03/2023 0412   RDW 14.5 10/03/2023 0412     Interventions   Plan: Ordered 1 unit PRBCs to be transfused. Ordered post transfusion labs.        Merdith Boyd Lamin Geradine Girt, MSN, APRN, AGACNP-BC [facility] [place] Pager: [phone]. Check Amion for Availability

## 2023-10-03 NOTE — Hospital Course (Addendum)
  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  He was found to be anemic requiring 1 unit PRBC transfusion but also due to concerns of NSTEMI patient was transferred to [facility] for cardiology consultation where he received additional units of PRBC and treatment for NSTEMI.  Eventually cardiology decided to transfer patient to [facility] on 10/27 for cardiac catheterization which showed moderate left main stenosis, severe RCA disease.  Cardiology is not planning on stenting left main and performing TAVR. Eventually was decided to discharge patient home on aspirin and Plavix for 1-2 weeks with outpatient follow-up with cardiology to determine when to proceed with TAVR/PCI.     Assessment & Plan:  Principal Problem:   Acute blood loss anemia Active Problems:   Coronary artery disease   Acute on chronic diastolic CHF (congestive heart failure) (HCC)   Essential hypertension   Lesion of right native kidney   Dyslipidemia, goal LDL below 70   Nicotine dependence, cigarettes, uncomplicated   Acute blood loss anemia secondary to hematuria, resolved BPH with chronic indwelling Foley catheter Continuous chronic Foley catheter for chronic obstruction and intermittent hematuria.  PRBC transfusion as necessary.  Eventually outpatient follow-up.   Symptomatic severe aortic stenosis Coronary artery disease/NSTEMI -Initially patient suffered from NSTEMI which was medically managed but eventually after performing left heart catheterization 10/21.  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     DVT prophylaxis: SCDs Start: 10/03/23 0805 Code Status: Full code Family Communication:   Status is: Inpatient Remains inpatient appropriate because: Discharge today

## 2023-10-03 NOTE — ED Provider Notes (Signed)
[facility]-EMERGENCY DEPT [facility] Provider Note MRN:  [id number]  Arrival date & time: 10/03/23     Chief Complaint   Shortness of Breath and Chest Pain   History of Present Illness   Kyle Matthews is a 77 y.o. year-old male with a history of hypertension presenting to the ED with chief complaint of shortness of breath and chest pain.  Cough, shortness of breath, intermittent chest pain for the past week.  Worse this evening.  Just felt like he could not breathe.  Review of Systems  A thorough review of systems was obtained and all systems are negative except as noted in the HPI and PMH.   Patient's Health History    Past Medical History:  Diagnosis Date   BPH (benign prostatic hyperplasia)    Status post biopsy in 2009. 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 GR 1 DD. No regional wall motion abnormality. Moderate aortic stenosis with moderate regurgitation. Mean gradient 34 mmHg.    Family History  Problem Relation Age of Onset   Coronary artery disease Mother 19       11. Was diagnosed with a blood clot - suspect that this was a STEMI   Lung cancer Father    Thyroid disease Sister    Healthy Brother 3   Pancreatic cancer Sister 2   Cancer Maternal Grandmother    Heart disease Maternal Grandfather 75   Cancer Paternal Grandmother    Stroke Paternal Grandfather     Social History   Socioeconomic History   Marital status: Married    Spouse name: Not on file   Number of children: 1   Years of education: Not on file   Highest education level: Not on file  Occupational History   Occupation: Retired    Comment:  Previously worked in Community education officer  Tobacco Use   Smoking status: Every Day    Current packs/day: 1.50    Average packs/day: 1.5 packs/day for 24.0 years (36.0 ttl pk-yrs)    Types: Cigarettes   Smokeless tobacco: Never  Substance and Sexual Activity   Alcohol use: Yes    Alcohol/week: 6.0 standard drinks of alcohol    Types: 6 Cans of beer per week   Drug use: No   Sexual activity: Not Currently  Other Topics Concern   Not on file  Social History Narrative   Married father of 1. Lives with his wife of 49 years. He is a retired Marine scientist used to work for [employer]   4 cups of coffee a day.    No structured exercise regimen   Social Determinants of Health   Financial Resource Strain: Not on file  Food Insecurity: Not on file  Transportation Needs: Not on file  Physical Activity: Not on file  Stress: Not on file  Social Connections: Not on file  Intimate Partner Violence: Not on file     Physical Exam   Vitals:   10/03/23 0434 10/03/23 0530  BP:  (!) 104/55  Pulse:  89  Resp:  17  Temp: 98.4 F (36.9 C)   SpO2:  100%    CONSTITUTIONAL: Well-appearing, NAD NEURO/PSYCH:  Alert and oriented x 3, no focal deficits EYES:  eyes equal and reactive ENT/NECK:  no LAD, no JVD CARDIO: Regular rate, well-perfused, normal S1 and S2 PULM:  CTAB no wheezing or rhonchi GI/GU:  non-distended, non-tender MSK/SPINE:  No gross deformities, no edema SKIN:  no rash, atraumatic   *Additional and/or pertinent findings included in MDM below  Diagnostic and Interventional Summary    EKG Interpretation Date/Time:  Tuesday October 03 2023 03:49:07 EDT Ventricular Rate:  101 PR Interval:  145 QRS Duration:  150 QT Interval:  382 QTC Calculation: 496 R Axis:   -20  Text Interpretation: Sinus tachycardia Right bundle branch block Abnormal T, consider ischemia, lateral leads Confirmed by Kennis Carina [phone]) on 10/03/2023 3:54:42 AM       Labs Reviewed  CBC -  Abnormal; Notable for the following components:      Result Value   RBC 2.27 (*)    Hemoglobin 6.5 (*)    HCT 20.3 (*)    All other components within normal limits  COMPREHENSIVE METABOLIC PANEL - Abnormal; Notable for the following components:   Sodium 134 (*)    Glucose, Bld 125 (*)    Calcium 7.9 (*)    Total Protein 5.9 (*)    Albumin 3.0 (*)    All other components within normal limits  BRAIN NATRIURETIC PEPTIDE - Abnormal; Notable for the following components:   B Natriuretic Peptide 547.0 (*)    All other components within normal limits  TROPONIN I (HIGH SENSITIVITY) - Abnormal; Notable for the following components:   Troponin I (High Sensitivity) 856 (*)    All other components within normal limits  TROPONIN I (HIGH SENSITIVITY) - Abnormal; Notable for the following components:   Troponin I (High Sensitivity) 1,395 (*)    All other components within normal limits  PROTIME-INR  TYPE AND SCREEN  PREPARE RBC (CROSSMATCH)  ABO/RH    CT Angio Chest Pulmonary Embolism (PE) W or WO Contrast  Final Result    CT ABDOMEN PELVIS W CONTRAST  Final Result    DG Chest Port 1 View  Final Result      Medications  0.9 %  sodium chloride infusion (Manually program via Guardrails IV Fluids) (has no administration in time range)  cefTRIAXone (ROCEPHIN) 2 g in sodium chloride 0.9 % 100 mL IVPB (has no administration in time range)  azithromycin (ZITHROMAX) 500 mg in sodium chloride 0.9 % 250 mL IVPB (has no administration in time range)  iohexol (OMNIPAQUE) 350 MG/ML injection 100 mL (100 mLs Intravenous Contrast Given 10/03/23 0542)     Procedures  /  Critical Care .Critical Care  Performed by: Sabas Sous, MD Authorized by: Sabas Sous, MD   Critical care provider statement:    Critical care time (minutes):  85   Critical care was necessary to treat or prevent imminent or life-threatening deterioration of the following conditions:  Respiratory failure   Critical care  was time spent personally by me on the following activities:  Development of treatment plan with patient or surrogate, discussions with consultants, evaluation of patient's response to treatment, examination of patient, ordering and review of laboratory studies, ordering and review of radiographic studies, ordering and performing treatments and interventions, pulse oximetry, re-evaluation of patient's condition and review of old charts   ED Course and Medical Decision Making  Initial Impression and Ddx Differential diagnosis includes pneumonia, pulmonary edema, ACS, less likely PE.  Valvular heart failure is also considered.  Patient requiring 4 to 5 L nasal cannula and still with O2 saturations in the mid 80s.  Respiratory therapy to initiate high flow nasal cannula therapy.  Past medical/surgical history that increases complexity of ED encounter: Hypertension  Interpretation of Diagnostics I personally reviewed the EKG and my interpretation is as follows: New right bundle branch block with diffuse ST segment abnormalities compared to prior  Labs reveal troponin elevation up to 1300, hemoglobin 6.5  Patient Reassessment and Ultimate Disposition/Management     Patient has stabilized on high flow nasal cannula 12 L.  No acute distress, normal vitals now.  Not having any chest pain.  Has been having persistent hematuria for the past 3 weeks possibly explaining the new anemia.  Denies any blood in stool or black stool.  Case discussed with cardiology, recommend holding off on heparin/aspirin for now given the acute anemia.  Plan is [facility] with cardiology following.  CT PE study obtained revealing no evidence of pulmonary embolism, most likely multifocal pneumonia.  Patient management required discussion with the following services or consulting groups:  Hospitalist Service and Cardiology  Complexity of Problems Addressed Acute illness or injury that poses threat of  life of bodily function  Additional Data Reviewed and Analyzed Further history obtained from: Prior labs/imaging results  Additional Factors Impacting ED Encounter Risk Consideration of hospitalization  Elmer Sow. Pilar Plate, MD [facility] Emergency Medicine [facility] [email or web address]  Final Clinical Impressions(s) / ED Diagnoses     ICD-10-CM   1. Acute respiratory failure with hypoxia (HCC)  J96.01     2. Multifocal pneumonia  J18.9     3. NSTEMI (non-ST elevated myocardial infarction) [facility])  I21.4       ED Discharge Orders     None        Discharge Instructions Discussed with and Provided to Patient:   Discharge Instructions   None      Sabas Sous, MD 10/03/23 0710

## 2023-10-03 NOTE — Progress Notes (Signed)
*  PRELIMINARY RESULTS* Echocardiogram 2D Echocardiogram has been performed.  Kyle Matthews 10/03/2023, 11:50 AM

## 2023-10-03 NOTE — H&P (Addendum)
History and Physical  [facility]  Brinton Glendening ZOX:[id number] DOB: 1945/12/27 DOA: 10/03/2023  PCP: Mila Palmer, MD  Patient coming from: Home by [facility] Level of care: Progressive  I have personally briefly reviewed patient's old medical records in [facility]  Chief Complaint: chest pain   HPI: Kyle Matthews is a 77 year old male retired [employer] scientist with history of moderate to severe AS, hypertension, hyperlipidemia, tobacco use, BPH with chronic bladder outlet obstruction and chronic indwelling Foley catheter, hematuria, RBBB, grade 1 diastolic heart dysfunction, prior CVA 2007 who reportedly is going to be having prostate surgery soon presented to the emergency department complaining of chest pain 7/10 and shortness of breath that has been intermittently problematic for the last 3 weeks.  He was placed on 12 L high flow nasal cannula and his oxygen saturation improved and his chest pain symptoms improved.  His ED workup was significant for hemoglobin of 6.5.  He reports that he has been having gross hematuria since having indwelling Foley catheter placed.  His cardiac BNP was 547.0 and high-sensitivity troponin tests 856 and 1395.  1 unit PRBC ordered.  Cardiology was consulted and they had recommended holding aspirin and heparin given severe anemia and will consult.  CT testing of the chest abdomen pelvis positive for multifocal pneumonia.  He also has some lesions on both kidneys that will need to be followed up with MRIs when he is medically stable.  He is being admitted for further management.    Past Medical History:  Diagnosis Date   BPH (benign prostatic hyperplasia)    Status post biopsy in 2009. 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   Stroke [facility]) 2007   No true etiology noted    Past  Surgical History:  Procedure Laterality Date   TRANSTHORACIC ECHOCARDIOGRAM  04/28/2017    EF 65-70%. GR 1 DD. No regional wall motion abnormality. Moderate aortic stenosis with moderate regurgitation. Mean gradient 34 mmHg.     reports that he has been smoking cigarettes. He has a 36 pack-year smoking history. He has never used smokeless tobacco. He reports current alcohol use of about 6.0 standard drinks of alcohol per week. He reports that he does not use drugs.  Allergies  Allergen Reactions   Tetanus Toxoids     Childhood Allergy     Family History  Problem Relation Age of Onset   Coronary artery disease Mother 61       47. Was diagnosed with a blood clot - suspect that this was a STEMI   Lung cancer Father    Thyroid disease Sister    Healthy Brother 48   Pancreatic cancer Sister 71   Cancer Maternal Grandmother    Heart disease Maternal Grandfather 54   Cancer Paternal Grandmother    Stroke Paternal Grandfather     Prior to Admission medications   Medication Sig Start Date End Date Taking? Authorizing Provider  amLODipine-benazepril (LOTREL) 10-40 MG capsule Take 1 capsule by mouth daily. 03/16/17   [provider]  aspirin 325 MG tablet Take 325 mg by mouth daily.    [provider]  carvedilol (COREG) 3.125 MG tablet Take 1 tablet (3.125 mg total) by mouth 2 (two) times daily. Patient taking differently: Take 3.125 mg by mouth every evening.  10/25/22   Maisie Fus, MD  finasteride (PROSCAR) 5 MG tablet Take 5 mg by mouth every evening.    [provider]  rosuvastatin (CRESTOR) 40 MG tablet Take 40 mg by mouth daily.    [provider]    Physical Exam: Vitals:   10/03/23 0424 10/03/23 0434 10/03/23 0530 10/03/23 0700  BP:   (!) 104/55   Pulse:   89 92  Resp:   17 17  Temp:  98.4 F (36.9 C)    TempSrc:  Oral    SpO2: 91%  100% 100%    Constitutional: awake, alert, pale, NAD, calm, comfortable Eyes: PERRL, lids and  conjunctivae normal ENMT: Mucous membranes are moist. Posterior pharynx clear of any exudate or lesions. Normal dentition.  Neck: normal, supple, no masses, no thyromegaly Respiratory: rales RLL,  Normal respiratory effort. No accessory muscle use.  Cardiovascular: normal s1, s2 sounds, no murmurs / rubs / gallops. No extremity edema. 2+ pedal pulses. No carotid bruits.  Abdomen: no tenderness, no masses palpated. No hepatosplenomegaly. Bowel sounds positive.  Musculoskeletal: no clubbing / cyanosis. No joint deformity upper and lower extremities. Good ROM, no contractures. Normal muscle tone.  Skin: no rashes, lesions, ulcers. No induration Neurologic: CN 2-12 grossly intact. Sensation intact, DTR normal. Strength 5/5 in all 4.  Psychiatric: Normal judgment and insight. Alert and oriented x 3. Normal mood.   Labs on Admission: I have personally reviewed following labs and imaging studies  CBC: Recent Labs  Lab 10/03/23 0412  WBC 10.3  HGB 6.5*  HCT 20.3*  MCV 89.4  PLT 240   Basic Metabolic Panel: Recent Labs  Lab 10/03/23 0412  NA 134*  K 3.6  CL 103  CO2 24  GLUCOSE 125*  BUN 18  CREATININE 0.81  CALCIUM 7.9*   GFR: Estimated Creatinine Clearance: 76.2 mL/min (by C-G formula based on SCr of 0.81 mg/dL). Liver Function Tests: Recent Labs  Lab 10/03/23 0412  AST 24  ALT 15  ALKPHOS 52  BILITOT 0.6  PROT 5.9*  ALBUMIN 3.0*   No results for input(s): "LIPASE", "AMYLASE" in the last 168 hours. No results for input(s): "AMMONIA" in the last 168 hours. Coagulation Profile: Recent Labs  Lab 10/03/23 0412  INR 1.1   Cardiac Enzymes: No results for input(s): "CKTOTAL", "CKMB", "CKMBINDEX", "TROPONINI" in the last 168 hours. BNP (last 3 results) No results for input(s): "PROBNP" in the last 8760 hours. HbA1C: No results for input(s): "HGBA1C" in the last 72 hours. CBG: No results for input(s): "GLUCAP" in the last 168 hours. Lipid Profile: No results for  input(s): "CHOL", "HDL", "LDLCALC", "TRIG", "CHOLHDL", "LDLDIRECT" in the last 72 hours. Thyroid Function Tests: No results for input(s): "TSH", "T4TOTAL", "FREET4", "T3FREE", "THYROIDAB" in the last 72 hours. Anemia Panel: No results for input(s): "VITAMINB12", "FOLATE", "FERRITIN", "TIBC", "IRON", "RETICCTPCT" in the last 72 hours. Urine analysis: No results found for: "COLORURINE", "APPEARANCEUR", "LABSPEC", "PHURINE", "GLUCOSEU", "HGBUR", "BILIRUBINUR", "KETONESUR", "PROTEINUR", "UROBILINOGEN", "NITRITE", "LEUKOCYTESUR"  Radiological Exams on Admission: CT Angio Chest Pulmonary Embolism (PE) W or WO Contrast  Result Date: 10/03/2023 CLINICAL DATA:  Shortness of breath for 3 weeks. Upper abdominal pain. EXAM: CT ANGIOGRAPHY CHEST CT ABDOMEN AND PELVIS WITH CONTRAST TECHNIQUE: Multidetector CT imaging of the chest was performed using the standard protocol during bolus administration of intravenous contrast. Multiplanar CT image reconstructions and MIPs were obtained to evaluate the vascular anatomy. Multidetector CT imaging of the abdomen and pelvis was performed using the standard protocol during  bolus administration of intravenous contrast. RADIATION DOSE REDUCTION: This exam was performed according to the departmental dose-optimization program which includes automated exposure control, adjustment of the mA and/or kV according to patient size and/or use of iterative reconstruction technique. CONTRAST:  OMNIPAQUE IOHEXOL 350 MG/ML SOLN COMPARISON:  None Available. FINDINGS: CTA CHEST FINDINGS Cardiovascular: Heart size upper normal. No substantial pericardial effusion. Coronary artery calcification is evident. Moderate atherosclerotic calcification is noted in the wall of the thoracic aorta. There is no filling defect within the opacified pulmonary arteries to suggest the presence of an acute pulmonary embolus. Mediastinum/Nodes: Scattered upper normal mediastinal lymph nodes evident, likely  reactive. There is no hilar lymphadenopathy. The esophagus has normal imaging features. There is no axillary lymphadenopathy. Lungs/Pleura: Centrilobular emphsyema noted. Interlobular septal thickening is noted bilaterally with patchy ground-glass airspace disease in the right middle and both lower lobes. Dense consolidative opacity is seen in the dependent lower lobes bilaterally. Small bilateral pleural effusions evident. Musculoskeletal: Bone windows reveal no worrisome lytic or sclerotic osseous lesions. Review of the MIP images confirms the above findings. CT ABDOMEN and PELVIS FINDINGS Hepatobiliary: 2.9 cm simple cyst identified right hepatic lobe. Scattered tiny hypodensities in the liver parenchyma are too small to characterize but are statistically most likely benign. No followup imaging is recommended. Gallbladder lumen is filled with tiny stones. No intrahepatic or extrahepatic biliary dilation. Pancreas: No focal mass lesion. No dilatation of the main duct. No intraparenchymal cyst. No peripancreatic edema. Spleen: No splenomegaly. No suspicious focal mass lesion. Adrenals/Urinary Tract: No adrenal nodule or mass. 6.1 cm exophytic cystic lesion upper pole right kidney demonstrates mural calcification. No mural thickening or nodularity by CT imaging. Numerous tiny hypodensities are seen in both kidneys, too small to characterize. 18 mm lesion interpolar left kidney on 28/2 has attenuation too high to be a simple cyst. Mild fullness noted both intrarenal collecting systems. No hydroureter. Circumferential bladder wall thickening evident. Urinary bladder is decompressed by Foley catheter. Stomach/Bowel: Stomach is unremarkable. No gastric wall thickening. No evidence of outlet obstruction. Duodenum is normally positioned as is the ligament of Treitz. No small bowel wall thickening. No small bowel dilatation. The terminal ileum is normal. Appendix is obscured by motion artifact but shows no definite  periappendiceal edema or inflammation. No gross colonic mass. No colonic wall thickening. Diverticular changes are noted in the left colon without evidence of diverticulitis. Vascular/Lymphatic: There is advanced atherosclerotic calcification of the abdominal aorta without aneurysm. There is no gastrohepatic or hepatoduodenal ligament lymphadenopathy. No retroperitoneal or mesenteric lymphadenopathy. No pelvic sidewall lymphadenopathy. Reproductive: Prostate gland is enlarged. Other: No intraperitoneal free fluid. Musculoskeletal: Bone windows reveal no worrisome lytic or sclerotic osseous lesions. Review of the MIP images confirms the above findings. IMPRESSION: 1. No CT evidence for acute pulmonary embolus. 2. Patchy areas of ground-glass opacity in both lungs suspicious for multifocal pneumonia. 3. Dense consolidative opacity in the dependent lower lobes bilaterally. Imaging features likely related to atelectasis although superimposed pneumonia in these regions not excluded. 4. Small bilateral pleural effusions. 5. Cholelithiasis. 6. 18 mm lesion interpolar left kidney has attenuation too high to be a simple cyst. Follow-up outpatient nonemergent MRI of the abdomen with and without contrast recommended to further evaluate. 7. 6.1 cm exophytic cystic lesion upper pole right kidney demonstrates mural calcification. No mural thickening or nodularity by CT imaging. 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 8. Circumferential bladder wall thickening with mild fullness of both intrarenal collecting systems. Imaging features may  be related to chronic bladder outlet obstruction given the enlarged prostate gland. 9. Left colonic diverticulosis without diverticulitis. 10. Aortic Atherosclerosis (ICD10-I70.0) and Emphysema (ICD10-J43.9). Electronically Signed   By: Kennith Center M.D.   On: 10/03/2023 06:56   CT ABDOMEN PELVIS W CONTRAST  Result Date: 10/03/2023 CLINICAL DATA:  Shortness of  breath for 3 weeks. Upper abdominal pain. EXAM: CT ANGIOGRAPHY CHEST CT ABDOMEN AND PELVIS WITH CONTRAST TECHNIQUE: Multidetector CT imaging of the chest was performed using the standard protocol during bolus administration of intravenous contrast. Multiplanar CT image reconstructions and MIPs were obtained to evaluate the vascular anatomy. Multidetector CT imaging of the abdomen and pelvis was performed using the standard protocol during bolus administration of intravenous contrast. RADIATION DOSE REDUCTION: This exam was performed according to the departmental dose-optimization program which includes automated exposure control, adjustment of the mA and/or kV according to patient size and/or use of iterative reconstruction technique. CONTRAST:  OMNIPAQUE IOHEXOL 350 MG/ML SOLN COMPARISON:  None Available. FINDINGS: CTA CHEST FINDINGS Cardiovascular: Heart size upper normal. No substantial pericardial effusion. Coronary artery calcification is evident. Moderate atherosclerotic calcification is noted in the wall of the thoracic aorta. There is no filling defect within the opacified pulmonary arteries to suggest the presence of an acute pulmonary embolus. Mediastinum/Nodes: Scattered upper normal mediastinal lymph nodes evident, likely reactive. There is no hilar lymphadenopathy. The esophagus has normal imaging features. There is no axillary lymphadenopathy. Lungs/Pleura: Centrilobular emphsyema noted. Interlobular septal thickening is noted bilaterally with patchy ground-glass airspace disease in the right middle and both lower lobes. Dense consolidative opacity is seen in the dependent lower lobes bilaterally. Small bilateral pleural effusions evident. Musculoskeletal: Bone windows reveal no worrisome lytic or sclerotic osseous lesions. Review of the MIP images confirms the above findings. CT ABDOMEN and PELVIS FINDINGS Hepatobiliary: 2.9 cm simple cyst identified right hepatic lobe. Scattered tiny  hypodensities in the liver parenchyma are too small to characterize but are statistically most likely benign. No followup imaging is recommended. Gallbladder lumen is filled with tiny stones. No intrahepatic or extrahepatic biliary dilation. Pancreas: No focal mass lesion. No dilatation of the main duct. No intraparenchymal cyst. No peripancreatic edema. Spleen: No splenomegaly. No suspicious focal mass lesion. Adrenals/Urinary Tract: No adrenal nodule or mass. 6.1 cm exophytic cystic lesion upper pole right kidney demonstrates mural calcification. No mural thickening or nodularity by CT imaging. Numerous tiny hypodensities are seen in both kidneys, too small to characterize. 18 mm lesion interpolar left kidney on 28/2 has attenuation too high to be a simple cyst. Mild fullness noted both intrarenal collecting systems. No hydroureter. Circumferential bladder wall thickening evident. Urinary bladder is decompressed by Foley catheter. Stomach/Bowel: Stomach is unremarkable. No gastric wall thickening. No evidence of outlet obstruction. Duodenum is normally positioned as is the ligament of Treitz. No small bowel wall thickening. No small bowel dilatation. The terminal ileum is normal. Appendix is obscured by motion artifact but shows no definite periappendiceal edema or inflammation. No gross colonic mass. No colonic wall thickening. Diverticular changes are noted in the left colon without evidence of diverticulitis. Vascular/Lymphatic: There is advanced atherosclerotic calcification of the abdominal aorta without aneurysm. There is no gastrohepatic or hepatoduodenal ligament lymphadenopathy. No retroperitoneal or mesenteric lymphadenopathy. No pelvic sidewall lymphadenopathy. Reproductive: Prostate gland is enlarged. Other: No intraperitoneal free fluid. Musculoskeletal: Bone windows reveal no worrisome lytic or sclerotic osseous lesions. Review of the MIP images confirms the above findings. IMPRESSION: 1. No CT  evidence for acute pulmonary embolus. 2. Patchy  areas of ground-glass opacity in both lungs suspicious for multifocal pneumonia. 3. Dense consolidative opacity in the dependent lower lobes bilaterally. Imaging features likely related to atelectasis although superimposed pneumonia in these regions not excluded. 4. Small bilateral pleural effusions. 5. Cholelithiasis. 6. 18 mm lesion interpolar left kidney has attenuation too high to be a simple cyst. Follow-up outpatient nonemergent MRI of the abdomen with and without contrast recommended to further evaluate. 7. 6.1 cm exophytic cystic lesion upper pole right kidney demonstrates mural calcification. No mural thickening or nodularity by CT imaging. 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 8. Circumferential bladder wall thickening with mild fullness of both intrarenal collecting systems. Imaging features may be related to chronic bladder outlet obstruction given the enlarged prostate gland. 9. Left colonic diverticulosis without diverticulitis. 10. Aortic Atherosclerosis (ICD10-I70.0) and Emphysema (ICD10-J43.9). Electronically Signed   By: Kennith Center M.D.   On: 10/03/2023 06:56   DG Chest Port 1 View  Result Date: 10/03/2023 CLINICAL DATA:  Chest pain and shortness of breath. EXAM: PORTABLE CHEST 1 VIEW COMPARISON:  None Available. FINDINGS: The cardio pericardial silhouette is enlarged. Interstitial markings are diffusely coarsened with chronic features. Vascular congestion noted without overt airspace pulmonary edema. No focal airspace consolidation or substantial pleural effusion. Bones are diffusely demineralized. Telemetry leads overlie the chest. IMPRESSION: 1. Enlargement of the cardiopericardial silhouette with vascular congestion. 2. Chronic interstitial coarsening. Electronically Signed   By: Kennith Center M.D.   On: 10/03/2023 05:58    EKG: Independently reviewed.   Assessment/Plan Principal Problem:   Multifocal  pneumonia Active Problems:   Chest pain   Symptomatic anemia   RBBB   Moderate calcific aortic stenosis - with moderate regurgitation   Essential hypertension   Carotid bruit   Dyslipidemia, goal LDL below 70   Tobacco use   Acute blood loss anemia   Kidney lesion, native, left   Acute respiratory failure with hypoxia (HCC)   Kidney lesion, native, right   NSTEMI (non-ST elevated myocardial infarction)   Elevated brain natriuretic peptide (BNP) level   SOB (shortness of breath)   Bladder outlet obstruction   Chronic indwelling Foley catheter   Hematuria   Enlarged prostate   Moderate aortic regurgitation   Acute respiratory failure with hypoxia  - multifactorial causes with bilateral pneumonia and acute blood loss anemia - continue supplemental oxygen but I would wean down as quickly as possible given COPD with goal pulse ox greater than 92%. - Treating pneumonia aggressively - Transfuse 1 unit PRBC already ordered in ED - Bronchodilators scheduled - CTA negative for PE - remains on high flow [place], wean down as able   Multifocal pneumonia  - continue IV ceftriaxone/azithromycin x 5 doses as ordered - mucolytics and cough suppressants - scheduled bronchodilators - supplemental oxygen with goals to wean down rapidly  - unfortunately blood cultures not taken before antibiotics given   COPD - tobacco cessation strongly advised  - steroids and bronchodilators as ordered - goal pulse ox >92%   NSTEMI - suspect demand ischemia from acute hypoxia and severe anemia (Hg 6.5) - transfusing 1 unit PRBC - supplemental oxygen as ordered  - appreciate cardiology team recommendations - 2D echocardiogram ordered and pending - aspirin had been on hold due to gross hematuria with clots from indwelling foley  - telemetry monitoring - per cardiology team, hold aspirin/heparin at this time   Severe AS - he has echo studies every 6 months and due now - 2D echocardiogram  ordered and  pending  Dyslipidemia - resume home rosuvastatin 40 mg daily   Essential hypertension  - resume carvedilol 3.125 mg BID, holding lotrel due to soft BPs  BPH/chronic bladder outlet obstruction - Pt is scheduled for prostate surgery later this month (10/25) - continue foley catheter - resume home finasteride 5 mg daily   Bilateral Renal Lesions - incidental findings on CT studies - he will need nonemergent MRI when he is more medically stable to follow up on these lesions likely can be done as an outpatient.     Critical Care Procedure Note Authorized and Performed by: Maryln Manuel MD  Total Critical Care time:  65 mins Due to a high probability of clinically significant, life threatening deterioration, the patient required my highest level of preparedness to intervene emergently and I personally spent this critical care time directly and personally managing the patient.  This critical care time included obtaining a history; examining the patient, pulse oximetry; ordering and review of studies; arranging urgent treatment with development of a management plan; evaluation of patient's response of treatment; frequent reassessment; and discussions with other providers.  This critical care time was performed to assess and manage the high probability of imminent and life threatening deterioration that could result in multi-organ failure.  It was exclusive of separately billable procedures and treating other patients and teaching time.   ** update 10/15: no beds at [facility], administration arranged [facility].  Discussed with Dr Wyline Mood, ok to transfer pt to [facility].  Signed to [facility] Dr. Corrie Mckusick at [facility].  Updated Dr. Wyline Mood about repeat troponin >4K, no change to management, ok to transfer to [facility], no plan for heparin, aspirin or invasive cardiac procedure at this time.    DVT prophylaxis: SCDs   Code Status: full   Family Communication: bedside update   Disposition Plan: anticipate home   Consults  called: cardiology   Admission status: INP  Level of care: Progressive Standley Dakins MD [facility] How to contact the [facility] Attending or Consulting provider 7A - 7P or covering provider during after hours 7P -7A, for this patient?  Check the care team in [facility] and look for a) attending/consulting [facility] provider listed and b) the [facility] team listed Log into [email or web address] and use [facility] universal password to access. If you do not have the password, please contact the hospital operator. Locate the [facility] provider you are looking for under [facility] and page to a number that you can be directly reached. If you still have difficulty reaching the provider, please page the [facility] (Director on Call) for the Hospitalists listed on amion for assistance.   If 7PM-7AM, please contact night-coverage [email or web address] Password [email or web address]  10/03/2023, 8:02 AM

## 2023-10-03 NOTE — ED Notes (Signed)
Pt reports complete BM prior to arrival yesterday.

## 2023-10-03 NOTE — ED Triage Notes (Addendum)
Pt BIB [facility] from chest pain (7/10) and SOB x 3 weeks intermittently, pt given 2 albuterol treatments and solumedrol PTA, O2 sat on Ra upon EMS arrival 82%, placed on 3L [place] 90%, then breathing treatments started, chest pain after meds 0/10, pt on 10L NRB 100%, EMS advises they were concerned about EKG and contacted [facility] EDP, EDP not concerned and feels BBB

## 2023-10-03 NOTE — ED Notes (Signed)
Previous critical lab values discussed with admitting MD and RN at [facility] that will be receiving patient. Care plan remains the same and [facility] will be able to accept.

## 2023-10-03 NOTE — Progress Notes (Signed)
Patient placed on 12L HFNC due to decreased O2 sats.  O2 sat 92% at this time.

## 2023-10-03 NOTE — Consult Note (Addendum)
Cardiology Consultation   Patient ID: Kyle Matthews MRN: [id number]; DOB: [birth year]  Admit date: 10/03/2023 Date of Consult: 10/03/2023  PCP:  Mila Palmer, MD   [facility] Providers Cardiologist:  Maisie Fus, MD        Patient Profile:   Kyle Matthews is a 77 y.o. male with a hx of aortic valve disease, essential hypertension, chronic right bundle Marirose Deveney block, dyslipidemia, carotid bruit, tobacco use, BPH who is being seen 10/03/2023 for the evaluation of chest pain at the request of Dr Virgel Bouquet.  History of Present Illness:   Mr. Matthews had previously been followed by Dr. Herbie Baltimore in 05/2017 had an echocardiogram revealing moderate aortic stenosis with a mean valve gradient of 34 mmHg and moderate AI.  EF 65 to 70%.  Bilateral carotid Dopplers revealed no obstruction.  Repeat echocardiogram in 04/2018 showed a normal EF that was essentially unchanged.  Echocardiogram completed 09/2022 showed normal LVEF/RV, severe AAS.  Mean gradient 43 mmHg, DI 0.32, valve area 0.9 cm, and moderate AI.  He was last seen in clinic 04/28/2023 by Dr. Judie Petit. Wyline Mood.  He had noted left leg weakness and could not walk in a straight line.  Within a couple hours he had felt better was question whether he had a TIA in 09/2022.  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  Blood pressure was slightly elevated at the time of his appointment.  He had noted chest tightness for approximately 20 minutes and some nausea while he was outside the day before.  He had gone home and slept for few hours and it resolved.  He had also felt some shortness of breath but denied any syncope or any recurrent issues.  At his appointment it was discussed if he was having recurrent issues with chest pain and shortness of breath we will likely plan for left heart catheterization and right heart catheterization to discuss whether he should be considered for SAVR/TAVR.  He presented to [facility] on 09/23/2023 awaiting prostate surgery  with a Foley catheter in place for urinary retention.  It was placed about a month and a half prior since.  Patient stated he had replaced approximately a week ago.  As he started with some bloody drainage without fevers or chills nausea or vomiting.  He was started on Keflex in the emergency department, Foley bag was changed as well as his Foley was sent for urine culture and treated for possible infection and was advised to follow-up with urology.  He presented to the [facility] emergency department earlier this morning via EMS with chest pain rated 7 out of 10 and shortness of breath x 3 weeks intermittently.  He was given 2 albuterol treatments and Solu-Medrol prior to arrival.  Oxygen saturations on room air upon EMS arrival was 82% he was placed on 3 L of O2 via nasal cannula increasing his oxygen saturations to 98%.  After his breathing had improved he would rated his chest pain 0 out of 10.  He was on a 10 L nonrebreather at 100%.  Patient stated he was positive for cough, shortness of breath, and intermittent chest pain for the past week that is worsened this evening.  Felt like that he was unable to catch his breath with exacerbated his symptoms.  He had also noticed swelling to his bilateral lower extremities over the past several weeks.  Blood pressure was 104/55, pulse of 89, respirations of 17, oxygen saturations was 100% on a nonrebreather.  Pertinent labs  were hemoglobin of 6.5, hematocrit 20.3, sodium 134, blood glucose 125, calcium 7.9, BNP of 547.0, high-sensitivity troponin of 856 and 1395.  CTA of the chest was negative for pulmonary embolism but was considered Kyle Matthews likely multifocal pneumonia.  He was treated with ceftriaxone 2 g IVPB, azithromycin 500 mg IVPB.  Patient was admitted to the hospitalist service with pending transfer to [facility] with cardiology following.  After talking with cardiology recommended holding off on heparin and aspirin given acute anemia.  Patient did state that  he had previously been advised to stop taking his aspirin due to having blood in his Foley bag.  Continues to remain chest pain-free this morning since the oxygen has improved.  Wife remains at the bedside.  Cardiology was consulted for patient's chief complaint of chest pain and elevated high-sensitivity troponin.  Past Medical History:  Diagnosis Date   BPH (benign prostatic hyperplasia)    Status post biopsy in 2009. 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   Stroke [facility]) 2007   No true etiology noted    Past Surgical History:  Procedure Laterality Date   TRANSTHORACIC ECHOCARDIOGRAM  04/28/2017    EF 65-70%. GR 1 DD. No regional wall motion abnormality. Moderate aortic stenosis with moderate regurgitation. Mean gradient 34 mmHg.     Home Medications:  Prior to Admission medications   Medication Sig Start Date End Date Taking? Authorizing Provider  amLODipine-benazepril (LOTREL) 10-40 MG capsule Take 1 capsule by mouth daily. 03/16/17   [provider]  aspirin 325 MG tablet Take 325 mg by mouth daily.    [provider]  carvedilol (COREG) 3.125 MG tablet Take 1 tablet (3.125 mg total) by mouth 2 (two) times daily. Patient taking differently: Take 3.125 mg by mouth every evening. 10/25/22   Maisie Fus, MD  finasteride (PROSCAR) 5 MG tablet Take 5 mg by mouth every evening.    [provider]  rosuvastatin (CRESTOR) 40 MG tablet Take 40 mg by mouth daily.    [provider]    Inpatient Medications: Scheduled Meds:  sodium chloride   Intravenous Once   ipratropium-albuterol  3 mL Nebulization Q6H   methylPREDNISolone (SOLU-MEDROL) injection  60 mg Intravenous Q12H   Followed by   Melene Muller ON 10/04/2023] predniSONE  40 mg Oral Q breakfast   Continuous Infusions:   azithromycin     [START ON 10/04/2023] cefTRIAXone (ROCEPHIN)  IV     PRN Meds: acetaminophen **OR** acetaminophen, bisacodyl, morphine injection, ondansetron **OR** ondansetron (ZOFRAN) IV, oxyCODONE, traZODone  Allergies:    Allergies  Allergen Reactions   Tetanus Toxoids     Childhood Allergy     Social History:   Social History   Socioeconomic History   Marital status: Married    Spouse name: Not on file   Number of children: 1   Years of education: Not on file   Highest education level: Not on file  Occupational History   Occupation: Retired    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 Lives with his wife of 49 years. He is a retired Marine scientist used to work for [employer]   4 cups of coffee a day.    No structured exercise regimen   Social Determinants of Health   Financial Resource Strain: Not on file  Food Insecurity: Not on file  Transportation Needs: Not on file  Physical Activity: Not on file  Stress: Not on file  Social Connections: Not on file  Intimate Partner Violence: Not on file    Family History:    Family History  Problem Relation Age of Onset   Coronary artery disease Mother 30       34. Was diagnosed with a blood clot - suspect that this was a STEMI   Lung cancer Father    Thyroid disease Sister    Healthy Brother 42   Pancreatic cancer Sister 35   Cancer Maternal Grandmother    Heart disease Maternal Grandfather 69   Cancer Paternal Grandmother    Stroke Paternal Grandfather       ROS:  Please see the history of present illness.  Review of Systems  Constitutional:  Positive for malaise/fatigue.  Respiratory:  Positive for cough, shortness of breath and wheezing.   Cardiovascular:  Positive for chest pain, orthopnea and leg swelling.  Gastrointestinal:  Positive for nausea.  Genitourinary:  Positive for hematuria.  Neurological:  Positive for weakness.    All other ROS reviewed and negative.     Physical Exam/Data:   Vitals:   10/03/23 0700 10/03/23 0800 10/03/23 0830 10/03/23 0847  BP:  115/60 (!) 114/54   Pulse: 92 88 89   Resp: 17 15 19    Temp:      TempSrc:      SpO2: 100% 100% 100% 100%   No intake or output data in the 24 hours ending 10/03/23 0927    09/23/2023    7:39 PM 04/28/2023    4:02 PM 10/25/2022    1:28 PM  Last 3 Weights  Weight (lbs) 152 lb 8.9 oz 152 lb 9.6 oz 155 lb 12.8 oz  Weight (kg) 69.2 kg 69.219 kg 70.67 kg     There is no height or weight on file to calculate BMI.  General:  Ill-appearing, well developed, in no acute distress HEENT: normal Neck: no JVD Vascular: No carotid bruits; Distal pulses 2+ bilaterally Cardiac:  normal S1, S2; RRR; III/VI systolic murmur best heard at the RSB that radiates in to the bilateral carotids Lungs:  coarse, diminished bases,  to auscultation bilaterally, with wheezing, maintaining oxygen saturation on high flow nasal cannula Abd: soft, nontender, no hepatomegaly  Ext: 2+ edema Musculoskeletal:  No deformities, BUE and BLE strength normal and equal Skin: warm and dry  Neuro:  CNs 2-12 intact, no focal abnormalities noted Psych:  Normal affect   EKG:  The EKG was personally reviewed and demonstrates: Sinus tachycardia with a rate of 101, chronic right bundle Jaziah Kwasnik block, left axis deviation, ST depression with T wave inversions Telemetry:  Telemetry was personally reviewed and demonstrates: Sinus rhythm rate of 90s with ST depression T wave inversions with occasional  PACs  Relevant CV Studies: TTE 10/10/22 (completed at outside facility, [facility])  Summary   1. The left ventricle is normal in size with mildly increased wall  thickness.   2. The left ventricular systolic function is normal, LVEF is visually  estimated at 65-70%.    3. There is grade I diastolic dysfunction (impaired relaxation).    4. Mitral annular calcification is present.    5. There is mild mitral valve regurgitation.    6. There is moderate aortic regurgitation.    7. There is severe aortic valve stenosis.    8. The right ventricle is normal in size, with normal systolic function.    9. There is mild pulmonary hypertension.    10. IVC size and inspiratory change suggest elevated right atrial pressure.  (10-20 mmHg).    11. There is no evidence of an interatrial flow communication or  intrapulmonary shunt by agitated saline study.   TTE 05/02/2018 Study Conclusions   - Left ventricle: The cavity size was normal. Wall thickness was    increased in a pattern of mild LVH. Systolic function was normal.    The estimated ejection fraction was in the range of 60% to 65%.    Wall motion was normal; there were no regional wall motion    abnormalities. Features are consistent with a pseudonormal left    ventricular filling pattern, with concomitant abnormal relaxation    and increased filling pressure (grade 2 diastolic dysfunction).  - Aortic valve: Mildly to moderately calcified annulus. Moderately    thickened, moderately calcified leaflets. There was moderate to    severe stenosis. There was moderate regurgitation. Mean gradient    (S): 36 mm Hg. Peak gradient (S): 74 mm Hg.  - Mitral valve: There was mild regurgitation.  - Pulmonary arteries: PA peak pressure: 31 mm Hg (S)   TTE 04/28/2017 Study Conclusions   - Left ventricle: The cavity size was normal. Systolic function was    vigorous. The estimated ejection fraction was in the range of 65%    to 70%. Wall motion was normal;  there were no regional wall    motion abnormalities. Doppler parameters are consistent with    abnormal left ventricular relaxation (grade 1 diastolic    dysfunction).  - Aortic valve: Valve mobility was restricted. There was moderate    stenosis. There was moderate regurgitation. Peak velocity (S):    389 cm/s. Mean gradient (S): 34 mm Hg.  - Pulmonary arteries: Systolic pressure was mildly increased. PA    peak pressure: 35 mm Hg (S).   Laboratory Data:  High Sensitivity Troponin:   Recent Labs  Lab 10/03/23 0412 10/03/23 0542  TROPONINIHS 856* 1,395*     Chemistry Recent Labs  Lab 10/03/23 0412  NA 134*  K 3.6  CL 103  CO2 24  GLUCOSE 125*  BUN 18  CREATININE 0.81  CALCIUM 7.9*  GFRNONAA >60  ANIONGAP 7    Recent Labs  Lab 10/03/23 0412  PROT 5.9*  ALBUMIN 3.0*  AST 24  ALT 15  ALKPHOS 52  BILITOT 0.6   Lipids No results for input(s): "CHOL", "TRIG", "HDL", "LABVLDL", "LDLCALC", "CHOLHDL" in the last 168 hours.  Hematology Recent Labs  Lab 10/03/23 0412  WBC 10.3  RBC 2.27*  HGB 6.5*  HCT 20.3*  MCV 89.4  MCH 28.6  MCHC 32.0  RDW 14.5  PLT 240   Thyroid No results for input(s): "TSH", "FREET4" in the last 168 hours.  BNP Recent Labs  Lab 10/03/23 0412  BNP 547.0*    DDimer No  results for input(s): "DDIMER" in the last 168 hours.   Radiology/Studies:  CT Angio Chest Pulmonary Embolism (PE) W or WO Contrast  Result Date: 10/03/2023 CLINICAL DATA:  Shortness of breath for 3 weeks. Upper abdominal pain. EXAM: CT ANGIOGRAPHY CHEST CT ABDOMEN AND PELVIS WITH CONTRAST TECHNIQUE: Multidetector CT imaging of the chest was performed using the standard protocol during bolus administration of intravenous contrast. Multiplanar CT image reconstructions and MIPs were obtained to evaluate the vascular anatomy. Multidetector CT imaging of the abdomen and pelvis was performed using the standard protocol during bolus administration of intravenous contrast.  RADIATION DOSE REDUCTION: This exam was performed according to the departmental dose-optimization program which includes automated exposure control, adjustment of the mA and/or kV according to patient size and/or use of iterative reconstruction technique. CONTRAST:  OMNIPAQUE IOHEXOL 350 MG/ML SOLN COMPARISON:  None Available. FINDINGS: CTA CHEST FINDINGS Cardiovascular: Heart size upper normal. No substantial pericardial effusion. Coronary artery calcification is evident. Moderate atherosclerotic calcification is noted in the wall of the thoracic aorta. There is no filling defect within the opacified pulmonary arteries to suggest the presence of an acute pulmonary embolus. Mediastinum/Nodes: Scattered upper normal mediastinal lymph nodes evident, likely reactive. There is no hilar lymphadenopathy. The esophagus has normal imaging features. There is no axillary lymphadenopathy. Lungs/Pleura: Centrilobular emphsyema noted. Interlobular septal thickening is noted bilaterally with patchy ground-glass airspace disease in the right middle and both lower lobes. Dense consolidative opacity is seen in the dependent lower lobes bilaterally. Small bilateral pleural effusions evident. Musculoskeletal: Bone windows reveal no worrisome lytic or sclerotic osseous lesions. Review of the MIP images confirms the above findings. CT ABDOMEN and PELVIS FINDINGS Hepatobiliary: 2.9 cm simple cyst identified right hepatic lobe. Scattered tiny hypodensities in the liver parenchyma are too small to characterize but are statistically most likely benign. No followup imaging is recommended. Gallbladder lumen is filled with tiny stones. No intrahepatic or extrahepatic biliary dilation. Pancreas: No focal mass lesion. No dilatation of the main duct. No intraparenchymal cyst. No peripancreatic edema. Spleen: No splenomegaly. No suspicious focal mass lesion. Adrenals/Urinary Tract: No adrenal nodule or mass. 6.1 cm exophytic cystic lesion  upper pole right kidney demonstrates mural calcification. No mural thickening or nodularity by CT imaging. Numerous tiny hypodensities are seen in both kidneys, too small to characterize. 18 mm lesion interpolar left kidney on 28/2 has attenuation too high to be a simple cyst. Mild fullness noted both intrarenal collecting systems. No hydroureter. Circumferential bladder wall thickening evident. Urinary bladder is decompressed by Foley catheter. Stomach/Bowel: Stomach is unremarkable. No gastric wall thickening. No evidence of outlet obstruction. Duodenum is normally positioned as is the ligament of Treitz. No small bowel wall thickening. No small bowel dilatation. The terminal ileum is normal. Appendix is obscured by motion artifact but shows no definite periappendiceal edema or inflammation. No gross colonic mass. No colonic wall thickening. Diverticular changes are noted in the left colon without evidence of diverticulitis. Vascular/Lymphatic: There is advanced atherosclerotic calcification of the abdominal aorta without aneurysm. There is no gastrohepatic or hepatoduodenal ligament lymphadenopathy. No retroperitoneal or mesenteric lymphadenopathy. No pelvic sidewall lymphadenopathy. Reproductive: Prostate gland is enlarged. Other: No intraperitoneal free fluid. Musculoskeletal: Bone windows reveal no worrisome lytic or sclerotic osseous lesions. Review of the MIP images confirms the above findings. IMPRESSION: 1. No CT evidence for acute pulmonary embolus. 2. Patchy areas of ground-glass opacity in both lungs suspicious for multifocal pneumonia. 3. Dense consolidative opacity in the dependent lower lobes bilaterally. Imaging features likely  related to atelectasis although superimposed pneumonia in these regions not excluded. 4. Small bilateral pleural effusions. 5. Cholelithiasis. 6. 18 mm lesion interpolar left kidney has attenuation too high to be a simple cyst. Follow-up outpatient nonemergent MRI of the  abdomen with and without contrast recommended to further evaluate. 7. 6.1 cm exophytic cystic lesion upper pole right kidney demonstrates mural calcification. No mural thickening or nodularity by CT imaging. 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 8. Circumferential bladder wall thickening with mild fullness of both intrarenal collecting systems. Imaging features may be related to chronic bladder outlet obstruction given the enlarged prostate gland. 9. Left colonic diverticulosis without diverticulitis. 10. Aortic Atherosclerosis (ICD10-I70.0) and Emphysema (ICD10-J43.9). Electronically Signed   By: Kennith Center M.D.   On: 10/03/2023 06:56   CT ABDOMEN PELVIS W CONTRAST  Result Date: 10/03/2023 CLINICAL DATA:  Shortness of breath for 3 weeks. Upper abdominal pain. EXAM: CT ANGIOGRAPHY CHEST CT ABDOMEN AND PELVIS WITH CONTRAST TECHNIQUE: Multidetector CT imaging of the chest was performed using the standard protocol during bolus administration of intravenous contrast. Multiplanar CT image reconstructions and MIPs were obtained to evaluate the vascular anatomy. Multidetector CT imaging of the abdomen and pelvis was performed using the standard protocol during bolus administration of intravenous contrast. RADIATION DOSE REDUCTION: This exam was performed according to the departmental dose-optimization program which includes automated exposure control, adjustment of the mA and/or kV according to patient size and/or use of iterative reconstruction technique. CONTRAST:  OMNIPAQUE IOHEXOL 350 MG/ML SOLN COMPARISON:  None Available. FINDINGS: CTA CHEST FINDINGS Cardiovascular: Heart size upper normal. No substantial pericardial effusion. Coronary artery calcification is evident. Moderate atherosclerotic calcification is noted in the wall of the thoracic aorta. There is no filling defect within the opacified pulmonary arteries to suggest the presence of an acute pulmonary embolus.  Mediastinum/Nodes: Scattered upper normal mediastinal lymph nodes evident, likely reactive. There is no hilar lymphadenopathy. The esophagus has normal imaging features. There is no axillary lymphadenopathy. Lungs/Pleura: Centrilobular emphsyema noted. Interlobular septal thickening is noted bilaterally with patchy ground-glass airspace disease in the right middle and both lower lobes. Dense consolidative opacity is seen in the dependent lower lobes bilaterally. Small bilateral pleural effusions evident. Musculoskeletal: Bone windows reveal no worrisome lytic or sclerotic osseous lesions. Review of the MIP images confirms the above findings. CT ABDOMEN and PELVIS FINDINGS Hepatobiliary: 2.9 cm simple cyst identified right hepatic lobe. Scattered tiny hypodensities in the liver parenchyma are too small to characterize but are statistically most likely benign. No followup imaging is recommended. Gallbladder lumen is filled with tiny stones. No intrahepatic or extrahepatic biliary dilation. Pancreas: No focal mass lesion. No dilatation of the main duct. No intraparenchymal cyst. No peripancreatic edema. Spleen: No splenomegaly. No suspicious focal mass lesion. Adrenals/Urinary Tract: No adrenal nodule or mass. 6.1 cm exophytic cystic lesion upper pole right kidney demonstrates mural calcification. No mural thickening or nodularity by CT imaging. Numerous tiny hypodensities are seen in both kidneys, too small to characterize. 18 mm lesion interpolar left kidney on 28/2 has attenuation too high to be a simple cyst. Mild fullness noted both intrarenal collecting systems. No hydroureter. Circumferential bladder wall thickening evident. Urinary bladder is decompressed by Foley catheter. Stomach/Bowel: Stomach is unremarkable. No gastric wall thickening. No evidence of outlet obstruction. Duodenum is normally positioned as is the ligament of Treitz. No small bowel wall thickening. No small bowel dilatation. The terminal  ileum is normal. Appendix is obscured by motion artifact but shows  no definite periappendiceal edema or inflammation. No gross colonic mass. No colonic wall thickening. Diverticular changes are noted in the left colon without evidence of diverticulitis. Vascular/Lymphatic: There is advanced atherosclerotic calcification of the abdominal aorta without aneurysm. There is no gastrohepatic or hepatoduodenal ligament lymphadenopathy. No retroperitoneal or mesenteric lymphadenopathy. No pelvic sidewall lymphadenopathy. Reproductive: Prostate gland is enlarged. Other: No intraperitoneal free fluid. Musculoskeletal: Bone windows reveal no worrisome lytic or sclerotic osseous lesions. Review of the MIP images confirms the above findings. IMPRESSION: 1. No CT evidence for acute pulmonary embolus. 2. Patchy areas of ground-glass opacity in both lungs suspicious for multifocal pneumonia. 3. Dense consolidative opacity in the dependent lower lobes bilaterally. Imaging features likely related to atelectasis although superimposed pneumonia in these regions not excluded. 4. Small bilateral pleural effusions. 5. Cholelithiasis. 6. 18 mm lesion interpolar left kidney has attenuation too high to be a simple cyst. Follow-up outpatient nonemergent MRI of the abdomen with and without contrast recommended to further evaluate. 7. 6.1 cm exophytic cystic lesion upper pole right kidney demonstrates mural calcification. No mural thickening or nodularity by CT imaging. 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 8. Circumferential bladder wall thickening with mild fullness of both intrarenal collecting systems. Imaging features may be related to chronic bladder outlet obstruction given the enlarged prostate gland. 9. Left colonic diverticulosis without diverticulitis. 10. Aortic Atherosclerosis (ICD10-I70.0) and Emphysema (ICD10-J43.9). Electronically Signed   By: Kennith Center M.D.   On: 10/03/2023 06:56   DG Chest  Port 1 View  Result Date: 10/03/2023 CLINICAL DATA:  Chest pain and shortness of breath. EXAM: PORTABLE CHEST 1 VIEW COMPARISON:  None Available. FINDINGS: The cardio pericardial silhouette is enlarged. Interstitial markings are diffusely coarsened with chronic features. Vascular congestion noted without overt airspace pulmonary edema. No focal airspace consolidation or substantial pleural effusion. Bones are diffusely demineralized. Telemetry leads overlie the chest. IMPRESSION: 1. Enlargement of the cardiopericardial silhouette with vascular congestion. 2. Chronic interstitial coarsening. Electronically Signed   By: Kennith Center M.D.   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 -Patient recently had to stop aspirin therapy due to hematuria -Not advised to start aspirin or heparin therapy at this time due to patient being chest pain-free and symptomatic anemia with a hemoglobin of 6.5 -Continue with telemetry monitoring -Echocardiogram ordered and pending with further recommendations to follow -Will likely need R/LHC once acute anemia and active infectious process has been treated -EKG as needed for pain or changes  Acute respiratory failure with hypoxia likely secondary to multifocal pneumonia -Presented with shortness of breath with sats in the 80s -Currently requiring high flow nasal cannula to maintain oxygen saturations -Titrate FiO2 to maintain oxygen saturations greater than equal to 92% -CTA of the chest negative for PE, patchy areas of  groundglass opacity in both lungs suspicious for multifocal pneumonia, dense consolidative opacity in the dependent lower lobes bilaterally -Continue with antibiotic therapy -BNP elevated at 547, would likely benefit from some mild diuresis -Work on pulmonary toileting -Supportive care -Continue management per IM  Symptomatic anemia -Hemoglobin on arrival 6.5 -Patient had  noted hematuria and he previously been evaluated in the emergency department -Recommend transfusing to keep hemoglobin greater than equal to 8 -Patient denies any black tarry stools or blood in his stool -Daily CBC  Essential hypertension -Blood pressure 114/54 -PTA antihypertensives currently on hold due to soft blood pressures -Vital signs per unit protocol  Dyslipidemia -Restart rosuvastatin 40 mg daily -LDL 93 10/09/2022 -Lipid panel ordered for a.m.  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  New York Heart Association (NYHA) Functional Class NYHA Class III        For questions or updates, please contact [facility] Please consult [email or web address] for contact info under    Signed, SHERI HAMMOCK, NP  10/03/2023 9:27 AM  Attending note  Patient seen and discussed with NP Hammock, I agree with her documentation. 77 yo male history of HTN, moderate aortic regurgitation, severe asymptomatic aortic stenosis,  prior CVA, HTN, HLD, chronic foley catheter/urinary obstruction, presents with chest pain and SOB.  Symptoms progressing over the last month. Burning midchest pain with SOB that would come on with exertion, resolve with rest.     WBC 10.3 Hgb 6.5 Plt 240 K 3.6 Cr 0.81 BUN 18 BNP 547 Trop 856-->1395 EKG: SR, inferior/lateral precordial ST depressions CXR: no acute process CT PE: no PE, + multifocal pneumonia.   09/2022 echo [facility]: LVEF 65-70%, grade I dd, mod AI, severe AS mean grad 43, DI 0.32, AVA VTI 0.9.   1.Elevated troponin - in setting of severe anemia Hgb 6.5, multifocal pneumonia, severe asymptomatic AS - I think demand ishcemia in setting of poor supply given severe anemia and hypoxia in the 80s on presentation. . ACS less likely. Would not be a cath candidate at this time given hematuria, severe anemia. No plans for ischemic testing at this time.  - transfuse Hgb to 8. WIth bleeding hold on any ASA   2. Severe aortic stenosis -09/2022 echo [facility]: LVEF 65-70%, grade I dd, mod AI, severe AS mean grad 43, DI 0.32, AVA VTI 0.9. - followed as outpatient, has been asymptomatic - repeat echo is pending this admission  3. Pneumonia - per primary team  4. Anemia - per primary team, admit Hgb 6.5. Recent hematuria.     Dina Rich MD

## 2023-10-03 NOTE — Progress Notes (Addendum)
Patient scheduled for preop appointment 10-03-23 but currently being admitted to the hospital at [facility] for pneumonia and respiratory failure.  Message left for Selita at Dr. Emmaline Life office regarding patient's current admission.

## 2023-10-03 NOTE — Progress Notes (Signed)
Transition of Care Department [facility]) has reviewed patient and no TOC needs have been identified at this time. We will continue to monitor patient advancement through interdisciplinary progression rounds. If new patient transition needs arise, please place a TOC consult.   10/03/23 1011  TOC Brief Assessment  Insurance and Status Reviewed  Patient has primary care physician Yes  Home environment has been reviewed Lives with wife.  Prior level of function: Independent at baseline.  Prior/Current Home Services No current home services  Social Determinants of Health Reivew SDOH reviewed no interventions necessary  Readmission risk has been reviewed Yes  Transition of care needs no transition of care needs at this time

## 2023-10-03 NOTE — ED Triage Notes (Signed)
BIBA Per EMS: Pt coming in w/ [facility] & chest pain intermittently x 3 weeks. Pt was given 5mg  albuterol, 125 solumedrol en route. 80% RA upon EMS arrival. Wheezing noted. Pt 95% 10L NRB

## 2023-10-04 DIAGNOSIS — J9601 Acute respiratory failure with hypoxia: Secondary | ICD-10-CM | POA: Diagnosis not present

## 2023-10-04 DIAGNOSIS — N289 Disorder of kidney and ureter, unspecified: Secondary | ICD-10-CM

## 2023-10-04 DIAGNOSIS — I350 Nonrheumatic aortic (valve) stenosis: Secondary | ICD-10-CM | POA: Diagnosis not present

## 2023-10-04 DIAGNOSIS — E785 Hyperlipidemia, unspecified: Secondary | ICD-10-CM

## 2023-10-04 DIAGNOSIS — N401 Enlarged prostate with lower urinary tract symptoms: Secondary | ICD-10-CM | POA: Insufficient documentation

## 2023-10-04 DIAGNOSIS — D62 Acute posthemorrhagic anemia: Secondary | ICD-10-CM | POA: Diagnosis not present

## 2023-10-04 DIAGNOSIS — I214 Non-ST elevation (NSTEMI) myocardial infarction: Secondary | ICD-10-CM | POA: Diagnosis not present

## 2023-10-04 DIAGNOSIS — I5032 Chronic diastolic (congestive) heart failure: Secondary | ICD-10-CM | POA: Diagnosis present

## 2023-10-04 DIAGNOSIS — N400 Enlarged prostate without lower urinary tract symptoms: Secondary | ICD-10-CM | POA: Insufficient documentation

## 2023-10-04 DIAGNOSIS — D508 Other iron deficiency anemias: Secondary | ICD-10-CM

## 2023-10-04 DIAGNOSIS — I5033 Acute on chronic diastolic (congestive) heart failure: Secondary | ICD-10-CM | POA: Diagnosis present

## 2023-10-04 DIAGNOSIS — N138 Other obstructive and reflux uropathy: Secondary | ICD-10-CM | POA: Diagnosis present

## 2023-10-04 LAB — TYPE AND SCREEN
ABO/RH(D): AB POS
Antibody Screen: NEGATIVE
Unit division: 0

## 2023-10-04 LAB — TROPONIN I (HIGH SENSITIVITY): Troponin I (High Sensitivity): 3357 ng/L (ref <18)

## 2023-10-04 LAB — CBC
HCT: 23.5 % — ABNORMAL LOW (ref 39.0–52.0)
HCT: 24.6 % — ABNORMAL LOW (ref 39.0–52.0)
Hemoglobin: 7.4 g/dL — ABNORMAL LOW (ref 13.0–17.0)
Hemoglobin: 7.7 g/dL — ABNORMAL LOW (ref 13.0–17.0)
MCH: 28.6 pg (ref 26.0–34.0)
MCH: 28.7 pg (ref 26.0–34.0)
MCHC: 31.5 g/dL (ref 30.0–36.0)
MCHC: 31.3 g/dL (ref 30.0–36.0)
MCV: 90.7 fL (ref 80.0–100.0)
MCV: 91.8 fL (ref 80.0–100.0)
Platelets: 187 10*3/uL (ref 150–400)
Platelets: 211 10*3/uL (ref 150–400)
RBC: 2.59 MIL/uL — ABNORMAL LOW (ref 4.22–5.81)
RBC: 2.68 MIL/uL — ABNORMAL LOW (ref 4.22–5.81)
RDW: 14.8 % (ref 11.5–15.5)
RDW: 15.2 % (ref 11.5–15.5)
WBC: 8.6 10*3/uL (ref 4.0–10.5)
WBC: 11.9 10*3/uL — ABNORMAL HIGH (ref 4.0–10.5)
nRBC: 0.6 % — ABNORMAL HIGH (ref 0.0–0.2)
nRBC: 0.5 % — ABNORMAL HIGH (ref 0.0–0.2)

## 2023-10-04 LAB — BRAIN NATRIURETIC PEPTIDE: B Natriuretic Peptide: 501.3 pg/mL — ABNORMAL HIGH (ref 0.0–100.0)

## 2023-10-04 LAB — COMPREHENSIVE METABOLIC PANEL
ALT: 14 U/L (ref 0–44)
AST: 24 U/L (ref 15–41)
Albumin: 2.7 g/dL — ABNORMAL LOW (ref 3.5–5.0)
Alkaline Phosphatase: 42 U/L (ref 38–126)
Anion gap: 9 (ref 5–15)
BUN: 29 mg/dL — ABNORMAL HIGH (ref 8–23)
CO2: 26 mmol/L (ref 22–32)
Calcium: 8.3 mg/dL — ABNORMAL LOW (ref 8.9–10.3)
Chloride: 104 mmol/L (ref 98–111)
Creatinine, Ser: 0.79 mg/dL (ref 0.61–1.24)
GFR, Estimated: >60 mL/min (ref >60)
Glucose, Bld: 159 mg/dL — ABNORMAL HIGH (ref 70–99)
Potassium: 4.1 mmol/L (ref 3.5–5.1)
Sodium: 139 mmol/L (ref 135–145)
Total Bilirubin: 0.5 mg/dL (ref 0.3–1.2)
Total Protein: 5.5 g/dL — ABNORMAL LOW (ref 6.5–8.1)

## 2023-10-04 LAB — HEMOGLOBIN AND HEMATOCRIT, BLOOD
HCT: 25 % — ABNORMAL LOW (ref 39.0–52.0)
Hemoglobin: 8 g/dL — ABNORMAL LOW (ref 13.0–17.0)

## 2023-10-04 LAB — MAGNESIUM: Magnesium: 2.2 mg/dL (ref 1.7–2.4)

## 2023-10-04 LAB — BPAM RBC
Blood Product Expiration Date: 202411092359
ISSUE DATE / TIME: 202410151115
Unit Type and Rh: 6200

## 2023-10-04 LAB — LIPID PANEL
Cholesterol: 112 mg/dL (ref 0–200)
HDL: 49 mg/dL (ref >40)
LDL Cholesterol: 54 mg/dL (ref 0–99)
Total CHOL/HDL Ratio: 2.3 {ratio}
Triglycerides: 44 mg/dL (ref <150)
VLDL: 9 mg/dL (ref 0–40)

## 2023-10-04 LAB — C-REACTIVE PROTEIN: CRP: 0.9 mg/dL (ref <1.0)

## 2023-10-04 LAB — PROTIME-INR
INR: 1.1 (ref 0.8–1.2)
Prothrombin Time: 14.5 s (ref 11.4–15.2)

## 2023-10-04 LAB — PROCALCITONIN: Procalcitonin: <0.1 ng/mL

## 2023-10-04 LAB — RESP PANEL BY RT-PCR (RSV, FLU A&B, COVID)  RVPGX2
Influenza A by PCR: NEGATIVE
Influenza B by PCR: NEGATIVE
Resp Syncytial Virus by PCR: NEGATIVE
SARS Coronavirus 2 by RT PCR: NEGATIVE

## 2023-10-04 MED ORDER — FUROSEMIDE 10 MG/ML IJ SOLN
20.0000 mg | Freq: Once | INTRAMUSCULAR | Status: AC
  Administered 2023-10-04: 20 mg via INTRAVENOUS
  Filled 2023-10-04: qty 2

## 2023-10-04 MED ORDER — CARVEDILOL 3.125 MG PO TABS
3.1250 mg | ORAL_TABLET | Freq: Two times a day (BID) | ORAL | Status: DC
  Administered 2023-10-04 – 2023-10-12 (×17): 3.125 mg via ORAL
  Filled 2023-10-04 (×17): qty 1

## 2023-10-04 MED ORDER — IPRATROPIUM-ALBUTEROL 0.5-2.5 (3) MG/3ML IN SOLN
3.0000 mL | Freq: Two times a day (BID) | RESPIRATORY_TRACT | Status: DC
  Administered 2023-10-04 – 2023-10-06 (×4): 3 mL via RESPIRATORY_TRACT
  Filled 2023-10-04 (×4): qty 3

## 2023-10-04 NOTE — Assessment & Plan Note (Deleted)
Known history of severe aortic stenosis with echocardiogram during this hospitalization revealing aortic valve area 0.89 cm Cardiology feels that the aortic stenosis will likely need to be addressed via TAVR before the patient proceeds with prostate surgery. Transfusing to target hemoglobin of 8 per my discussion with Dr. Mayford Knife today in anticipation for possible cardiac catheterization on Monday at [facility] and eventual TAVR.

## 2023-10-04 NOTE — Assessment & Plan Note (Deleted)
Acute blood loss anemia due to substantial intermittent gross hematuria over the past several weeks Patient received 1 unit of packed blood cell transfusion in the [facility] emergency department on 10/15, one unit upon arrival to [facility] stepdown unit on 10/1 and 1 additional unit on 10/17 No evidence currently of active hematuria.  No clinical evidence of other sources of bleeding.   Transfusing to target hemoglobin of 8 considering concurrent severe aortic stenosis in preparation for eventual cardiac catheterization and TAVR, possibly Monday. Performing serial CBCs Still occult negative. Avoiding aspirin or heparin at this time SCDs for DVT prophylaxis.

## 2023-10-04 NOTE — Assessment & Plan Note (Signed)
Continue statin therapy.

## 2023-10-04 NOTE — Assessment & Plan Note (Deleted)
Patient reports at least a 2-1/29-month history of progressively worsening dyspnea on exertion in the home setting.   This is combined with substantial worsening as patient became profoundly anemic. Weaning O2 as hemoglobin has improved

## 2023-10-04 NOTE — Progress Notes (Signed)
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  Patient was found to have a hemoglobin of  6.5 in the [facility] emergency department status post 1 unit packed red blood cell transfusion.    His cardiac BNP was 547.0 and high-sensitivity troponin tests 856 and 1395 concerning for NSTEMI.   Cardiology was consulted.. Patient was felt to be suffering from an NSTEMI but due to the patient's ongoing bleeding cardiology advised not to proceed with heparin or aspirin therapy.  The hospitalist group was called and patient was admitted to the hospitalist service.  Patient was then transferred to [facility] for further medical management after it was determined that patient was not going to undergo immediate invasive cardiac intervention.  Upon arrival to [facility] hemoglobin was noted to be 6.8 the evening of 10/15 prompting 1 additional unit of packed red blood cells to be transfused.  Assessment and Plan: * NSTEMI (non-ST elevated myocardial infarction) (HCC) High-sensitivity troponin peaked at 4814 and is now downtrending with last reading of 3357 morning of 10/16. Initial concerns for multifocal pneumonia as the cause however with a normal procalcitonin/white  count and minimal respiratory symptoms it is more likely the patient's severe anemia due to ongoing hematuria in the setting of severe aortic stenosis prompted the patient's NSTEMI. Avoiding aspirin or heparin therapy due to substantial intermittent hematuria per the advisement of cardiology Cardiology following, their input is appreciated.  Acute blood loss anemia Acute blood loss anemia due to substantial intermittent gross hematuria over the past several weeks Patient received 1 unit of packed blood cell transfusion in the [facility] emergency department and an additional unit upon arrival to [facility] stepdown unit. Transfusing to target hemoglobin of 8 considering concurrent severe aortic stenosis Performing serial CBCs Obtain coagulation profile Avoiding aspirin or heparin at this time SCDs for DVT prophylaxis.  Severe aortic stenosis Known history of severe aortic stenosis with echocardiogram during this hospitalization revealing aortic valve area 0.89 cm Cardiology feels that the aortic stenosis will likely need to be addressed via TAVR before the patient proceeds with prostate surgery. Supportive care for now, transfusing to target hemoglobin of 8.  BPH with obstruction/lower urinary tract symptoms Patient has had an indwelling Foley catheter since early September due to urinary obstruction secondary to severe BPH Patient reports being due to have surgery 10/25 Cardiology recommending rectifying severe arctic stenosis via TAVR prior to proceeding. Keeping current Foley catheter in place Avoiding anticoagulants so as not to provoke bleeding.  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  Dyslipidemia,  goal LDL below 70 Continue statin therapy  Acute respiratory failure with hypoxia (HCC) Acute hypoxic respiratory failure likely secondary to severe anemia and mild pulmonary edema 20 mg of intravenous Lasix administered Weaning O2 as hemoglobin has improved   Subjective:  Patient states that the chest discomfort he experienced while arriving to the [facility] department has resolved.  Shortness of breath has also essentially resolved.  Patient only complains of minimal occasional cough.  Patient denies any recent fevers or sick contacts.  Physical Exam:  Vitals:   10/04/23 1630 10/04/23 1700 10/04/23 1730 10/04/23 1800  BP:      Pulse: 80 81 88 81  Resp: 18 18 (!) 22 (!) 21  Temp:      TempSrc:      SpO2: 98% 97% 95% 95%  Weight:      Height:        Constitutional: Awake alert and oriented x3, no associated distress.   Neck: Notable elevation of jugular venous pulse while patient is lying at 30 degrees.  Neck is supple without any associated lymphadenopathy. Skin: no rashes, no lesions, good skin turgor noted. Eyes: Pupils are equally reactive to light.  No evidence of scleral icterus or conjunctival pallor.  ENMT: Moist mucous membranes noted.  Posterior pharynx clear of any exudate or lesions.   Respiratory: Mild lower rales with no evidence of wheezing.  Normal respiratory effort. No accessory muscle use.  Cardiovascular: Regular rate and rhythm, 3 out of systolic murmur best heard over the aortic valve.  No extremity edema. 2+ pedal pulses. No carotid bruits.  Abdomen: Abdomen is soft and nontender.  No evidence of intra-abdominal masses.  Positive bowel sounds noted in all quadrants.   Musculoskeletal: No joint deformity upper and lower extremities. Good ROM, no contractures. Normal muscle tone.    Data Reviewed:  I have personally reviewed and interpreted labs, imaging.  Significant findings are   CBC: Recent Labs  Lab  10/03/23 0412 10/03/23 1648 10/03/23 2032 10/04/23 0509 10/04/23 0809  WBC 10.3  --   --  8.6  --   HGB 6.5* 6.9* 6.8* 7.4* 8.0*  HCT 20.3* 21.5* 21.3* 23.5* 25.0*  MCV 89.4  --   --  90.7  --   PLT 240  --   --  187  --    Basic Metabolic Panel: Recent Labs  Lab 10/03/23 0412 10/04/23 0509  NA 134* 139  K 3.6 4.1  CL 103 104  CO2 24 26  GLUCOSE 125* 159*  BUN 18 29*  CREATININE 0.81 0.79  CALCIUM 7.9* 8.3*  MG  --  2.2   GFR: Estimated Creatinine Clearance: 70 mL/min (by C-G formula based on SCr of 0.79 mg/dL). Liver Function Tests: Recent Labs  Lab 10/03/23 0412 10/04/23 0509  AST 24 24  ALT 15 14  ALKPHOS 52 42  BILITOT 0.6 0.5  PROT 5.9* 5.5*  ALBUMIN 3.0* 2.7*    Coagulation Profile: Recent Labs  Lab 10/03/23 0412 10/04/23 0509  INR 1.1 1.1     Telemetry: Personally reviewed.  Rhythm is normal sinus rhythm with heart rate of 80 bpm.  No dynamic ST segment changes appreciated.   Code Status:  Full code.  Code status decision has been confirmed with: patient   Severity of Illness:  The appropriate patient status for this patient is INPATIENT. Inpatient status is judged to be reasonable and necessary in order to provide the required intensity of service to ensure the patient's safety. The  patient's presenting symptoms, physical exam findings, and initial radiographic and laboratory data in the context of their chronic comorbidities is felt to place them at high risk for further clinical deterioration. Furthermore, it is not anticipated that the patient will be medically stable for discharge from the hospital within 2 midnights of admission.   * I certify that at the point of admission it is my clinical judgment that the patient will require inpatient hospital care spanning beyond 2 midnights from the point of admission due to high intensity of service, high risk for further deterioration and high frequency of surveillance required.*  Time spent:  59  minutes  Author:  Marinda Elk MD  10/04/2023 7:02 PM

## 2023-10-04 NOTE — Assessment & Plan Note (Addendum)
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

## 2023-10-04 NOTE — Plan of Care (Signed)

## 2023-10-04 NOTE — Assessment & Plan Note (Addendum)
Improving rales and wheezing. Patient near euvolemia.

## 2023-10-04 NOTE — Progress Notes (Addendum)
Rounding Note    Patient Name: Kyle Matthews Date of Encounter: 10/04/2023  [facility] Cardiologist: Maisie Fus, MD   Subjective   No acute overnight events. Patient is feeling better after blood transfusion. He denies any recurrent chest pain and does not feel short of breath with supplemental O2. Currently on 2L (was not on any O2 at home).  Inpatient Medications    Scheduled Meds:  carvedilol  3.125 mg Oral BID WC   Chlorhexidine Gluconate Cloth  6 each Topical Daily   finasteride  5 mg Oral QPM   ipratropium-albuterol  3 mL Nebulization BID   predniSONE  40 mg Oral Q breakfast   rosuvastatin  40 mg Oral QPM   Continuous Infusions:  azithromycin Stopped (10/04/23 1056)   cefTRIAXone (ROCEPHIN)  IV Stopped (10/04/23 0902)   PRN Meds: acetaminophen **OR** acetaminophen, bisacodyl, morphine injection, ondansetron **OR** ondansetron (ZOFRAN) IV, oxyCODONE, traZODone   Vital Signs    Vitals:   10/04/23 0900 10/04/23 1000 10/04/23 1100 10/04/23 1200  BP: (!) 110/49 (!) 136/53 (!) 118/54   Pulse: 92 99 87   Resp: 17 15 (!) 21   Temp:    98.3 F (36.8 C)  TempSrc:    Oral  SpO2: 99% 98% 97%   Weight:      Height:        Intake/Output Summary (Last 24 hours) at 10/04/2023 1426 Last data filed at 10/04/2023 1316 Gross per 24 hour  Intake 1267.19 ml  Output 800 ml  Net 467.19 ml      10/04/2023    5:00 AM 10/03/2023    6:49 PM 09/23/2023    7:39 PM  Last 3 Weights  Weight (lbs) 153 lb 7 oz 154 lb 5.2 oz 152 lb 8.9 oz  Weight (kg) 69.6 kg 70 kg 69.2 kg      Telemetry    Normal sinus rhythm with rates in the 80s to 90s. - Personally Reviewed  ECG    Normal sinus rhythm, rate 73 bpm, with RBBB with significant ST depressions and biphasic T waves in leads V3-V5 and milder changes in inferior leads.- Personally Reviewed  Physical Exam   GEN: Caucasian male resting comfortably in no acute distress. On 2L of O2. Neck: No JVD. Cardiac: RRR.  III/ systolic murmur heard throughout.  Respiratory: No increased work of breathing. Expiratory rhonchi noted in all lung fields but no significant crackles appreciated. MS: Trace lower extremity edema bilaterally.No deformity. Skin: Warm and dry. Neuro:  No focal deficits. Psych: Normal affect. Responds appropriately.   Labs    High Sensitivity Troponin:   Recent Labs  Lab 10/03/23 0412 10/03/23 0542 10/03/23 1521 10/04/23 0505  TROPONINIHS 856* 1,395* 4,814* 3,357*     Chemistry Recent Labs  Lab 10/03/23 0412 10/04/23 0509  NA 134* 139  K 3.6 4.1  CL 103 104  CO2 24 26  GLUCOSE 125* 159*  BUN 18 29*  CREATININE 0.81 0.79  CALCIUM 7.9* 8.3*  MG  --  2.2  PROT 5.9* 5.5*  ALBUMIN 3.0* 2.7*  AST 24 24  ALT 15 14  ALKPHOS 52 42  BILITOT 0.6 0.5  GFRNONAA >60 >60  ANIONGAP 7 9    Lipids  Recent Labs  Lab 10/04/23 0509  CHOL 112  TRIG 44  HDL 49  LDLCALC 54  CHOLHDL 2.3    Hematology Recent Labs  Lab 10/03/23 0412 10/03/23 1648 10/03/23 2032 10/04/23 0509 10/04/23 0809  WBC 10.3  --   --  8.6  --   RBC 2.27*  --   --  2.59*  --   HGB 6.5*   < > 6.8* 7.4* 8.0*  HCT 20.3*   < > 21.3* 23.5* 25.0*  MCV 89.4  --   --  90.7  --   MCH 28.6  --   --  28.6  --   MCHC 32.0  --   --  31.5  --   RDW 14.5  --   --  14.8  --   PLT 240  --   --  187  --    < > = values in this interval not displayed.   Thyroid No results for input(s): "TSH", "FREET4" in the last 168 hours.  BNP Recent Labs  Lab 10/03/23 0412 10/04/23 0509  BNP 547.0* 501.3*    DDimer No results for input(s): "DDIMER" in the last 168 hours.   Radiology    ECHOCARDIOGRAM COMPLETE  Result Date: 10/03/2023    ECHOCARDIOGRAM REPORT   Patient Name:   SAILOR MCCONAUGHY Date of Exam: 10/03/2023 Medical Rec #:  [id number]  Height:       68.0 in Accession #:    [id number] Weight:       152.6 lb Date of Birth:  [birth year]   BSA:          1.822 m Patient Age:    77 years   BP:           111/68 mmHg Patient  Gender: M          HR:           94 bpm. [facility] Procedure: 2D Echo, Cardiac Doppler and Color Doppler Indications:    SBE  History:        Patient has prior history of Echocardiogram examinations, most                 recent 05/02/2018. Acute MI, Arrythmias:RBBB,                 Signs/Symptoms:Chest Pain and Shortness of Breath; Risk                 Factors:Hypertension, Dyslipidemia and Current Smoker.  Sonographer:    Mikki Harbor Referring Phys: [id number] SHERI HAMMOCK IMPRESSIONS  1. Left ventricular ejection fraction, by estimation, is 55 to 60%. The left ventricle has normal function. The left ventricle has no regional wall motion abnormalities. There is mild left ventricular hypertrophy. Left ventricular diastolic parameters are indeterminate. Elevated left atrial pressure.  2. Right ventricular systolic function is normal. The right ventricular size is normal. There is mildly elevated pulmonary artery systolic pressure.  3. Left atrial size was moderately dilated.  4. Right atrial size was moderately dilated.  5. The mitral valve is abnormal. Mild to moderate mitral valve regurgitation. No evidence of mitral stenosis.  6. The tricuspid valve is abnormal.  7. The aortic valve was not well visualized. There is severe calcifcation of the aortic valve. There is severe thickening of the aortic valve. Aortic valve regurgitation is moderate to severe. Severe aortic valve stenosis. Severe aortic stenosis is present. Aortic valve mean gradient measures 42.0 mmHg. Aortic valve peak gradient measures 69.2 mmHg. Aortic valve area, by VTI measures 0.89 cm.  8. The inferior vena cava is dilated in size with >50% respiratory variability, suggesting right atrial pressure of 8 mmHg. FINDINGS  Left Ventricle: Left ventricular ejection fraction, by estimation, is 55 to 60%. The left  ventricle has normal function. The left ventricle has no regional wall motion abnormalities. The left ventricular internal  cavity size was normal in size. There is  mild left ventricular hypertrophy. Left ventricular diastolic parameters are indeterminate. Elevated left atrial pressure. Right Ventricle: The right ventricular size is normal. Right vetricular wall thickness was not well visualized. Right ventricular systolic function is normal. There is mildly elevated pulmonary artery systolic pressure. The tricuspid regurgitant velocity  is 3.02 m/s, and with an assumed right atrial pressure of 8 mmHg, the estimated right ventricular systolic pressure is 44.5 mmHg. Left Atrium: Left atrial size was moderately dilated. Right Atrium: Right atrial size was moderately dilated. Pericardium: There is no evidence of pericardial effusion. Mitral Valve: The mitral valve is abnormal. Mild mitral annular calcification. Mild to moderate mitral valve regurgitation. No evidence of mitral valve stenosis. MV peak gradient, 11.4 mmHg. The mean mitral valve gradient is 6.0 mmHg. Tricuspid Valve: The tricuspid valve is abnormal. Tricuspid valve regurgitation is mild . No evidence of tricuspid stenosis. Aortic Valve: The aortic valve was not well visualized. There is severe calcifcation of the aortic valve. There is severe thickening of the aortic valve. There is severe aortic valve annular calcification. Aortic valve regurgitation is moderate to severe. Aortic regurgitation PHT measures 227 msec. Severe aortic stenosis is present. Aortic valve mean gradient measures 42.0 mmHg. Aortic valve peak gradient measures 69.2 mmHg. Aortic valve area, by VTI measures 0.89 cm. Pulmonic Valve: The pulmonic valve was not well visualized. Pulmonic valve regurgitation is not visualized. No evidence of pulmonic stenosis. Aorta: The aortic root and ascending aorta are structurally normal, with no evidence of dilitation. Venous: The inferior vena cava is dilated in size with greater than 50% respiratory variability, suggesting right atrial pressure of 8 mmHg. IAS/Shunts:  No atrial level shunt detected by color flow Doppler.  LEFT VENTRICLE PLAX 2D LVIDd:         4.80 cm      Diastology LVIDs:         3.20 cm      LV e' medial:    6.96 cm/s LV PW:         1.10 cm      LV E/e' medial:  18.8 LV IVS:        1.10 cm      LV e' lateral:   9.25 cm/s LVOT diam:     1.90 cm      LV E/e' lateral: 14.2 LV SV:         91 LV SV Index:   50 LVOT Area:     2.84 cm  LV Volumes (MOD) LV vol d, MOD A2C: 111.0 ml LV vol d, MOD A4C: 141.0 ml LV vol s, MOD A2C: 58.4 ml LV vol s, MOD A4C: 43.7 ml LV SV MOD A2C:     52.6 ml LV SV MOD A4C:     141.0 ml LV SV MOD BP:      76.4 ml RIGHT VENTRICLE RV Basal diam:  3.80 cm RV Mid diam:    3.20 cm RV S prime:     15.30 cm/s TAPSE (M-mode): 2.2 cm LEFT ATRIUM             Index        RIGHT ATRIUM           Index LA diam:        3.80 cm 2.09 cm/m   RA Area:     21.80 cm LA  Vol (A2C):   93.7 ml 51.44 ml/m  RA Volume:   76.90 ml  42.22 ml/m LA Vol (A4C):   75.9 ml 41.67 ml/m LA Biplane Vol: 84.6 ml 46.44 ml/m  AORTIC VALVE                     PULMONIC VALVE AV Area (Vmax):    0.93 cm      PV Vmax:       0.88 m/s AV Area (Vmean):   0.95 cm      PV Peak grad:  3.1 mmHg AV Area (VTI):     0.89 cm AV Vmax:           416.00 cm/s AV Vmean:          286.667 cm/s AV VTI:            1.020 m AV Peak Grad:      69.2 mmHg AV Mean Grad:      42.0 mmHg LVOT Vmax:         137.00 cm/s LVOT Vmean:        95.600 cm/s LVOT VTI:          0.320 m LVOT/AV VTI ratio: 0.31 AI PHT:            227 msec  AORTA Ao Root diam: 3.30 cm Ao Asc diam:  3.40 cm MITRAL VALVE                TRICUSPID VALVE MV Area (PHT): 3.63 cm     TR Peak grad:   36.5 mmHg MV Peak grad:  11.4 mmHg    TR Vmax:        302.00 cm/s MV Mean grad:  6.0 mmHg MV Vmax:       1.68 m/s     SHUNTS MV Vmean:      111.0 cm/s   Systemic VTI:  0.32 m MV Decel Time: 209 msec     Systemic Diam: 1.90 cm MV E velocity: 131.00 cm/s MV A velocity: 130.00 cm/s MV E/A ratio:  1.01 Dina Rich MD Electronically signed by  Dina Rich MD Signature Date/Time: 10/03/2023/12:33:39 PM    Final    CT Angio Chest Pulmonary Embolism (PE) W or WO Contrast  Result Date: 10/03/2023 CLINICAL DATA:  Shortness of breath for 3 weeks. Upper abdominal pain. EXAM: CT ANGIOGRAPHY CHEST CT ABDOMEN AND PELVIS WITH CONTRAST TECHNIQUE: Multidetector CT imaging of the chest was performed using the standard protocol during bolus administration of intravenous contrast. Multiplanar CT image reconstructions and MIPs were obtained to evaluate the vascular anatomy. Multidetector CT imaging of the abdomen and pelvis was performed using the standard protocol during bolus administration of intravenous contrast. RADIATION DOSE REDUCTION: This exam was performed according to the departmental dose-optimization program which includes automated exposure control, adjustment of the mA and/or kV according to patient size and/or use of iterative reconstruction technique. CONTRAST:  OMNIPAQUE IOHEXOL 350 MG/ML SOLN COMPARISON:  None Available. FINDINGS: CTA CHEST FINDINGS Cardiovascular: Heart size upper normal. No substantial pericardial effusion. Coronary artery calcification is evident. Moderate atherosclerotic calcification is noted in the wall of the thoracic aorta. There is no filling defect within the opacified pulmonary arteries to suggest the presence of an acute pulmonary embolus. Mediastinum/Nodes: Scattered upper normal mediastinal lymph nodes evident, likely reactive. There is no hilar lymphadenopathy. The esophagus has normal imaging features. There is no axillary lymphadenopathy. Lungs/Pleura: Centrilobular emphsyema noted. Interlobular septal thickening is noted bilaterally with  patchy ground-glass airspace disease in the right middle and both lower lobes. Dense consolidative opacity is seen in the dependent lower lobes bilaterally. Small bilateral pleural effusions evident. Musculoskeletal: Bone windows reveal no worrisome lytic or sclerotic  osseous lesions. Review of the MIP images confirms the above findings. CT ABDOMEN and PELVIS FINDINGS Hepatobiliary: 2.9 cm simple cyst identified right hepatic lobe. Scattered tiny hypodensities in the liver parenchyma are too small to characterize but are statistically most likely benign. No followup imaging is recommended. Gallbladder lumen is filled with tiny stones. No intrahepatic or extrahepatic biliary dilation. Pancreas: No focal mass lesion. No dilatation of the main duct. No intraparenchymal cyst. No peripancreatic edema. Spleen: No splenomegaly. No suspicious focal mass lesion. Adrenals/Urinary Tract: No adrenal nodule or mass. 6.1 cm exophytic cystic lesion upper pole right kidney demonstrates mural calcification. No mural thickening or nodularity by CT imaging. Numerous tiny hypodensities are seen in both kidneys, too small to characterize. 18 mm lesion interpolar left kidney on 28/2 has attenuation too high to be a simple cyst. Mild fullness noted both intrarenal collecting systems. No hydroureter. Circumferential bladder wall thickening evident. Urinary bladder is decompressed by Foley catheter. Stomach/Bowel: Stomach is unremarkable. No gastric wall thickening. No evidence of outlet obstruction. Duodenum is normally positioned as is the ligament of Treitz. No small bowel wall thickening. No small bowel dilatation. The terminal ileum is normal. Appendix is obscured by motion artifact but shows no definite periappendiceal edema or inflammation. No gross colonic mass. No colonic wall thickening. Diverticular changes are noted in the left colon without evidence of diverticulitis. Vascular/Lymphatic: There is advanced atherosclerotic calcification of the abdominal aorta without aneurysm. There is no gastrohepatic or hepatoduodenal ligament lymphadenopathy. No retroperitoneal or mesenteric lymphadenopathy. No pelvic sidewall lymphadenopathy. Reproductive: Prostate gland is enlarged. Other: No  intraperitoneal free fluid. Musculoskeletal: Bone windows reveal no worrisome lytic or sclerotic osseous lesions. Review of the MIP images confirms the above findings. IMPRESSION: 1. No CT evidence for acute pulmonary embolus. 2. Patchy areas of ground-glass opacity in both lungs suspicious for multifocal pneumonia. 3. Dense consolidative opacity in the dependent lower lobes bilaterally. Imaging features likely related to atelectasis although superimposed pneumonia in these regions not excluded. 4. Small bilateral pleural effusions. 5. Cholelithiasis. 6. 18 mm lesion interpolar left kidney has attenuation too high to be a simple cyst. Follow-up outpatient nonemergent MRI of the abdomen with and without contrast recommended to further evaluate. 7. 6.1 cm exophytic cystic lesion upper pole right kidney demonstrates mural calcification. No mural thickening or nodularity by CT imaging. 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 8. Circumferential bladder wall thickening with mild fullness of both intrarenal collecting systems. Imaging features may be related to chronic bladder outlet obstruction given the enlarged prostate gland. 9. Left colonic diverticulosis without diverticulitis. 10. Aortic Atherosclerosis (ICD10-I70.0) and Emphysema (ICD10-J43.9). Electronically Signed   By: Kennith Center M.D.   On: 10/03/2023 06:56   CT ABDOMEN PELVIS W CONTRAST  Result Date: 10/03/2023 CLINICAL DATA:  Shortness of breath for 3 weeks. Upper abdominal pain. EXAM: CT ANGIOGRAPHY CHEST CT ABDOMEN AND PELVIS WITH CONTRAST TECHNIQUE: Multidetector CT imaging of the chest was performed using the standard protocol during bolus administration of intravenous contrast. Multiplanar CT image reconstructions and MIPs were obtained to evaluate the vascular anatomy. Multidetector CT imaging of the abdomen and pelvis was performed using the standard protocol during bolus administration of intravenous contrast.  RADIATION DOSE REDUCTION: This exam was performed according to the departmental dose-optimization  program which includes automated exposure control, adjustment of the mA and/or kV according to patient size and/or use of iterative reconstruction technique. CONTRAST:  OMNIPAQUE IOHEXOL 350 MG/ML SOLN COMPARISON:  None Available. FINDINGS: CTA CHEST FINDINGS Cardiovascular: Heart size upper normal. No substantial pericardial effusion. Coronary artery calcification is evident. Moderate atherosclerotic calcification is noted in the wall of the thoracic aorta. There is no filling defect within the opacified pulmonary arteries to suggest the presence of an acute pulmonary embolus. Mediastinum/Nodes: Scattered upper normal mediastinal lymph nodes evident, likely reactive. There is no hilar lymphadenopathy. The esophagus has normal imaging features. There is no axillary lymphadenopathy. Lungs/Pleura: Centrilobular emphsyema noted. Interlobular septal thickening is noted bilaterally with patchy ground-glass airspace disease in the right middle and both lower lobes. Dense consolidative opacity is seen in the dependent lower lobes bilaterally. Small bilateral pleural effusions evident. Musculoskeletal: Bone windows reveal no worrisome lytic or sclerotic osseous lesions. Review of the MIP images confirms the above findings. CT ABDOMEN and PELVIS FINDINGS Hepatobiliary: 2.9 cm simple cyst identified right hepatic lobe. Scattered tiny hypodensities in the liver parenchyma are too small to characterize but are statistically most likely benign. No followup imaging is recommended. Gallbladder lumen is filled with tiny stones. No intrahepatic or extrahepatic biliary dilation. Pancreas: No focal mass lesion. No dilatation of the main duct. No intraparenchymal cyst. No peripancreatic edema. Spleen: No splenomegaly. No suspicious focal mass lesion. Adrenals/Urinary Tract: No adrenal nodule or mass. 6.1 cm exophytic cystic lesion  upper pole right kidney demonstrates mural calcification. No mural thickening or nodularity by CT imaging. Numerous tiny hypodensities are seen in both kidneys, too small to characterize. 18 mm lesion interpolar left kidney on 28/2 has attenuation too high to be a simple cyst. Mild fullness noted both intrarenal collecting systems. No hydroureter. Circumferential bladder wall thickening evident. Urinary bladder is decompressed by Foley catheter. Stomach/Bowel: Stomach is unremarkable. No gastric wall thickening. No evidence of outlet obstruction. Duodenum is normally positioned as is the ligament of Treitz. No small bowel wall thickening. No small bowel dilatation. The terminal ileum is normal. Appendix is obscured by motion artifact but shows no definite periappendiceal edema or inflammation. No gross colonic mass. No colonic wall thickening. Diverticular changes are noted in the left colon without evidence of diverticulitis. Vascular/Lymphatic: There is advanced atherosclerotic calcification of the abdominal aorta without aneurysm. There is no gastrohepatic or hepatoduodenal ligament lymphadenopathy. No retroperitoneal or mesenteric lymphadenopathy. No pelvic sidewall lymphadenopathy. Reproductive: Prostate gland is enlarged. Other: No intraperitoneal free fluid. Musculoskeletal: Bone windows reveal no worrisome lytic or sclerotic osseous lesions. Review of the MIP images confirms the above findings. IMPRESSION: 1. No CT evidence for acute pulmonary embolus. 2. Patchy areas of ground-glass opacity in both lungs suspicious for multifocal pneumonia. 3. Dense consolidative opacity in the dependent lower lobes bilaterally. Imaging features likely related to atelectasis although superimposed pneumonia in these regions not excluded. 4. Small bilateral pleural effusions. 5. Cholelithiasis. 6. 18 mm lesion interpolar left kidney has attenuation too high to be a simple cyst. Follow-up outpatient nonemergent MRI of the  abdomen with and without contrast recommended to further evaluate. 7. 6.1 cm exophytic cystic lesion upper pole right kidney demonstrates mural calcification. No mural thickening or nodularity by CT imaging. 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 8. Circumferential bladder wall thickening with mild fullness of both intrarenal collecting systems. Imaging features may be related to chronic bladder outlet obstruction given the enlarged prostate gland. 9. Left colonic diverticulosis without  diverticulitis. 10. Aortic Atherosclerosis (ICD10-I70.0) and Emphysema (ICD10-J43.9). Electronically Signed   By: Kennith Center M.D.   On: 10/03/2023 06:56   DG Chest Port 1 View  Result Date: 10/03/2023 CLINICAL DATA:  Chest pain and shortness of breath. EXAM: PORTABLE CHEST 1 VIEW COMPARISON:  None Available. FINDINGS: The cardio pericardial silhouette is enlarged. Interstitial markings are diffusely coarsened with chronic features. Vascular congestion noted without overt airspace pulmonary edema. No focal airspace consolidation or substantial pleural effusion. Bones are diffusely demineralized. Telemetry leads overlie the chest. IMPRESSION: 1. Enlargement of the cardiopericardial silhouette with vascular congestion. 2. Chronic interstitial coarsening. Electronically Signed   By: Kennith Center M.D.   On: 10/03/2023 05:58    Cardiac Studies   Echocardiogram 10/03/2023: Impressions: 1. Left ventricular ejection fraction, by estimation, is 55 to 60%. The  left ventricle has normal function. The left ventricle has no regional  wall motion abnormalities. There is mild left ventricular hypertrophy.  Left ventricular diastolic parameters  are indeterminate. Elevated left atrial pressure.   2. Right ventricular systolic function is normal. The right ventricular  size is normal. There is mildly elevated pulmonary artery systolic  pressure.   3. Left atrial size was moderately dilated.   4.  Right atrial size was moderately dilated.   5. The mitral valve is abnormal. Mild to moderate mitral valve  regurgitation. No evidence of mitral stenosis.   6. The tricuspid valve is abnormal.   7. The aortic valve was not well visualized. There is severe calcifcation  of the aortic valve. There is severe thickening of the aortic valve.  Aortic valve regurgitation is moderate to severe. Severe aortic valve  stenosis. Severe aortic stenosis is  present. Aortic valve mean gradient measures 42.0 mmHg. Aortic valve peak  gradient measures 69.2 mmHg. Aortic valve area, by VTI measures 0.89 cm.   8. The inferior vena cava is dilated in size with >50% respiratory  variability, suggesting right atrial pressure of 8 mmHg.    Patient Profile     77 y.o. male with a history of severe aortic stenosis/ moderate aortic regurgitation, chronic RBBB, hypertension, hyperlipidemia, CVA in 2007, BPH with chronic bladder outlet obstruction and chronic indwelling Foley, and tobacco abuse who presented to [facility] on 10/03/2023 with intermittent chest pain and shortness of breath for the past 3 weeks. O2 sats were in the 80s. Work-up showed multifocal pneumonia and severe anemia with hemoglobin of 6.5 (secondary to gross hematuria). He also ruled in for NSTEMI with high-sensitivity troponin peaking at 4,814. Cardiology was consulted and this was fle to tbe secondary to demand ischemia in setting of acute anemia and acute hypoxic respiratory failure secondary to multifocal pneumonia. He was not felt to be a cardiac catheterization candidate in light of his severe anemia and hematuria. He was tansferred to [facility] for further management.  Assessment & Plan    NSTEMI High-sensitivity troponin peaked at 4,814 and trended down. EKG today does showes significant ST depression and T wave inversions in leads V3-V5 and milder changes in inferior leads. Echo showed LVEF of 55-60% with no reginal wall motion  abnormalities.  - No recurrent chest pain after blood transfusions. - Continue beta-blocker and statin. - No IV Heparin or Aspirin given severe anemia and hematuria while on Aspirin.  - Symptoms and troponin elevation felt to be more consistent with demand ischemia from severe anemia and acute hypoxic respiratory failure with underlying severe aortic stenosis rather than true ACS. He  does have  coronary calcifications noted on chest CTA and he has multiple CV risk factors including prior CVA. He is also in need of a prostate surgery. Discussed with MD - recommend diagnostic R/LHC for TAVR/ SAVR work-up once hemoglobin has stabilized and after he has been treated for pneumonia. May be able to to this early next week.  Severe Aortic Stenosis  Initially diagnosed with severe AS at [facility] in 09/2022. Echo this admission showed LVEF of 55-60% with normal wall motion, normal RV, moderate biatrial enlargement, and severe aortic stenosis with mean gradient of .  - BNP is elevated at 547 >> 501. However, he does not look significantly volume overloaded on exam. Will hold off on any diuresis for now. - Recommend R/LHC for TAVR/ SAVR work-up once hemoglobin has stabilized and after he has been treated for pneumonia as noted above. Will then need evaluation by Structural Heart Team. He is in need of a prostate surgery and may need their input on timing of valve repair (before or after prostate surgery).   Hypertension  BP soft at times but mostly well controlled.  - Continue Coreg 3.125mg  twice daily.  - Agree with holding home Amlodipine-Benazepril.  - Want to avoid hypotension given severe aortic stenosis.  Hyperlipidemia Lipid panel this admission: Total Cholesterol 112, Triglycerides 44, HDL 49, LDL 54. - Continue Crestor 40mg  daily.  Symptomatic Anemia Hematuria Hemoglobin 6.5 on admission secondary to gross hematuria since having Foley placed in 07/2023. Patient states hematuria did stop after he  stopped his Aspirin 5 days prior to admission. S/p 2 unit of PRBCs.  - Hemoglobin stable at 7.4.  - Continue to hold Aspirin. - Recommend keeping hemoglobin >8 given severe aortic stenosis. - Management per primary team.  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   For questions or updates, please contact [facility] Please consult [email or web address] for contact info under        Signed, Corrin Parker, PA-C  10/04/2023, 2:26 PM

## 2023-10-04 NOTE — Assessment & Plan Note (Deleted)
High-sensitivity troponin peaked at 4814 and is now downtrending with last reading of 3357 morning of 10/16. Patient remains chest pain-free. It is my likely that the patient's severe anemia due to ongoing hematuria in the setting of severe aortic stenosis prompted the patient's NSTEMI. Avoiding aspirin or heparin therapy due to substantial intermittent hematuria per the advisement of cardiology Cardiology following, their input is appreciated. Patient will likely need cardiac catheterization in preparation for aortic valve replacement during this hospitalization, possibly Monday at [facility].

## 2023-10-04 NOTE — Assessment & Plan Note (Deleted)
Patient has had an indwelling Foley catheter since early September due to urinary obstruction secondary to severe BPH Patient has communicated to his outpatient urologist on 10/18 his current condition and procedure will now be postponed. Cardiology recommending rectifying severe arctic stenosis via TAVR prior to proceeding with urologic surgery. Keeping current Foley catheter in place Continue Proscar Avoiding anticoagulants so as not to provoke bleeding.

## 2023-10-05 ENCOUNTER — Ambulatory Visit: Payer: Medicare Other | Attending: Physician Assistant

## 2023-10-05 ENCOUNTER — Inpatient Hospital Stay (HOSPITAL_COMMUNITY): Payer: Medicare Other

## 2023-10-05 DIAGNOSIS — I350 Nonrheumatic aortic (valve) stenosis: Secondary | ICD-10-CM | POA: Diagnosis not present

## 2023-10-05 DIAGNOSIS — I214 Non-ST elevation (NSTEMI) myocardial infarction: Secondary | ICD-10-CM | POA: Diagnosis not present

## 2023-10-05 DIAGNOSIS — Z01810 Encounter for preprocedural cardiovascular examination: Secondary | ICD-10-CM

## 2023-10-05 DIAGNOSIS — D62 Acute posthemorrhagic anemia: Secondary | ICD-10-CM | POA: Diagnosis not present

## 2023-10-05 DIAGNOSIS — J9601 Acute respiratory failure with hypoxia: Secondary | ICD-10-CM | POA: Diagnosis not present

## 2023-10-05 DIAGNOSIS — F17210 Nicotine dependence, cigarettes, uncomplicated: Secondary | ICD-10-CM

## 2023-10-05 LAB — COMPREHENSIVE METABOLIC PANEL
ALT: 16 U/L (ref 0–44)
AST: 19 U/L (ref 15–41)
Albumin: 2.8 g/dL — ABNORMAL LOW (ref 3.5–5.0)
Alkaline Phosphatase: 39 U/L (ref 38–126)
Anion gap: 9 (ref 5–15)
BUN: 40 mg/dL — ABNORMAL HIGH (ref 8–23)
CO2: 26 mmol/L (ref 22–32)
Calcium: 8 mg/dL — ABNORMAL LOW (ref 8.9–10.3)
Chloride: 99 mmol/L (ref 98–111)
Creatinine, Ser: 0.94 mg/dL (ref 0.61–1.24)
GFR, Estimated: >60 mL/min (ref >60)
Glucose, Bld: 122 mg/dL — ABNORMAL HIGH (ref 70–99)
Potassium: 4.3 mmol/L (ref 3.5–5.1)
Sodium: 134 mmol/L — ABNORMAL LOW (ref 135–145)
Total Bilirubin: 0.3 mg/dL (ref 0.3–1.2)
Total Protein: 5.3 g/dL — ABNORMAL LOW (ref 6.5–8.1)

## 2023-10-05 LAB — PROTIME-INR
INR: 1.1 (ref 0.8–1.2)
Prothrombin Time: 14.2 s (ref 11.4–15.2)

## 2023-10-05 LAB — CBC
HCT: 23.4 % — ABNORMAL LOW (ref 39.0–52.0)
Hemoglobin: 7.4 g/dL — ABNORMAL LOW (ref 13.0–17.0)
MCH: 28.9 pg (ref 26.0–34.0)
MCHC: 31.6 g/dL (ref 30.0–36.0)
MCV: 91.4 fL (ref 80.0–100.0)
Platelets: 180 10*3/uL (ref 150–400)
RBC: 2.56 MIL/uL — ABNORMAL LOW (ref 4.22–5.81)
RDW: 15.3 % (ref 11.5–15.5)
WBC: 11 10*3/uL — ABNORMAL HIGH (ref 4.0–10.5)
nRBC: 0.5 % — ABNORMAL HIGH (ref 0.0–0.2)

## 2023-10-05 LAB — HEMOGLOBIN AND HEMATOCRIT, BLOOD
HCT: 29 % — ABNORMAL LOW (ref 39.0–52.0)
Hemoglobin: 9.1 g/dL — ABNORMAL LOW (ref 13.0–17.0)

## 2023-10-05 LAB — C-REACTIVE PROTEIN: CRP: 0.7 mg/dL (ref <1.0)

## 2023-10-05 LAB — APTT: aPTT: 29 s (ref 24–36)

## 2023-10-05 LAB — PREPARE RBC (CROSSMATCH)

## 2023-10-05 MED ORDER — POLYETHYLENE GLYCOL 3350 17 G PO PACK
17.0000 g | PACK | Freq: Every day | ORAL | Status: DC | PRN
  Administered 2023-10-06: 17 g via ORAL
  Filled 2023-10-05: qty 1

## 2023-10-05 MED ORDER — FUROSEMIDE 10 MG/ML IJ SOLN: 20.0000 mg | Freq: Once | INTRAMUSCULAR | Status: DC

## 2023-10-05 MED ORDER — FUROSEMIDE 10 MG/ML IJ SOLN
20.0000 mg | INTRAMUSCULAR | Status: AC
  Administered 2023-10-05: 20 mg via INTRAVENOUS
  Filled 2023-10-05: qty 2

## 2023-10-05 MED ORDER — AZITHROMYCIN 250 MG PO TABS
500.0000 mg | ORAL_TABLET | Freq: Every day | ORAL | Status: AC
  Administered 2023-10-06 – 2023-10-07 (×2): 500 mg via ORAL
  Filled 2023-10-05 (×2): qty 2

## 2023-10-05 MED ORDER — SODIUM CHLORIDE 0.9% IV SOLUTION: Freq: Once | INTRAVENOUS | Status: AC

## 2023-10-05 NOTE — Plan of Care (Signed)

## 2023-10-05 NOTE — Progress Notes (Addendum)
Virtual Visit via Telephone Note   Because of Haidin Borchert co-morbid illnesses, he is at least at moderate risk for complications without adequate follow up.  This format is felt to be most appropriate for this patient at this time.  The patient did not have access to video technology/had technical difficulties with video requiring transitioning to audio format only (telephone).  All issues noted in this document were discussed and addressed.  No physical exam could be performed with this format.  Please refer to the patient's chart for his consent to telehealth for [facility].  Evaluation Performed:  Preoperative cardiovascular risk assessment _____________   Date:  10/05/2023   Patient ID:  Kyle Matthews, DOB [birth year], MRN [id number] Patient Location:  Home Provider location:   Office  Primary Care Provider:  Mila Palmer, MD Primary Cardiologist:  Maisie Fus, MD  Chief Complaint / Patient Profile   77 y.o. y/o male with a h/o BPH, HTN, RBBB, moderate AI who is pending robotic prostatectomy and presents today for telephonic preoperative cardiovascular risk assessment.  History of Present Illness    Kyle Matthews is a 77 y.o. male who presents via audio/video conferencing for a telehealth visit today.  Pt was last seen in cardiology clinic on 04/28/2023 by Dr. Carolan Clines.  At that time Kyle Matthews was doing well .  The patient is now pending procedure as outlined above. Since his last visit, he He was feeling some SOB and some heart burn and he would sit back and breathe and it would go away. Troponin 3,357.  Echocardiogram and CTA was performed in the ER.  Ultimately, he tells me that they wanted to set up a cardiac catheterization tentatively for Monday (10/09/2023).  His aspirin is on hold due to some gross hematuria and clots from his indwelling Foley.  The increase in his troponin was suspected to be due to demand ischemia from acute hypoxia and severe anemia (hemoglobin  6.5).  However, he was diagnosed with an NSTEMI.  I discussed that his procedure will likely be on hold until we can get him fully worked up from a cardiac standpoint and get his cardiac catheterization scheduled.  I will send a message to Dr. Wyline Mood and also Dr. Mayford Knife (he mentioned that she saw him in the hospital)  and see where we are with scheduling this.  He can hold his aspirin x 5 to 7 days prior to the procedure and resume when medically safe to do so. Stopped due to bleeding (hematuria).    Past Medical History    Past Medical History:  Diagnosis Date   BPH (benign prostatic hyperplasia)    Status post biopsy in 2009. 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   Stroke [facility]) 2007   No true etiology noted   Past Surgical History:  Procedure Laterality Date   TRANSTHORACIC ECHOCARDIOGRAM  04/28/2017    EF 65-70%. GR 1 DD. No regional wall motion abnormality. Moderate aortic stenosis with moderate regurgitation. Mean gradient 34 mmHg.    Allergies  Allergies  Allergen Reactions   Tetanus Toxoids     Childhood Allergy     Home Medications    Prior to Admission medications   Medication Sig Start Date End Date Taking? Authorizing Provider  amLODipine-benazepril (LOTREL) 10-40  MG capsule Take 1 capsule by mouth daily. 03/16/17   [provider]  carvedilol (COREG) 3.125 MG tablet Take 1 tablet (3.125 mg total) by mouth 2 (two) times daily. Patient taking differently: Take 3.125 mg by mouth 2 (two) times daily with a meal. 10/25/22   Maisie Fus, MD  cephALEXin (KEFLEX) 500 MG capsule Take 500 mg by mouth 3 (three) times daily.    [provider]  finasteride (PROSCAR) 5 MG tablet Take 5 mg by mouth every evening.    [provider]  rosuvastatin (CRESTOR) 40 MG tablet Take 40  mg by mouth daily.    [provider]    Physical Exam    Vital Signs:  Kyle Matthews does not have vital signs available for review today.  Given telephonic nature of communication, physical exam is limited. AAOx3. NAD. Normal affect.  Speech and respirations are unlabored.  Accessory Clinical Findings    None  Assessment & Plan    1.  Preoperative Cardiovascular Risk Assessment:   Due to recent events he will need a cardiac catheterization prior to cardiac clearance being granted.  Procedure will likely be postponed.  A copy of this note will be routed to requesting surgeon.  Time:   Today, I have spent 6 minutes with the patient with telehealth technology discussing medical history, symptoms, and management plan.     ADDENDUM: due to admission, billing changed to no-charge in case the billers go back to see why the charge was changed.  Sharlene Dory, PA-C  10/05/2023, 8:50 AM

## 2023-10-05 NOTE — Progress Notes (Signed)
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  Patient was found to have a hemoglobin of  6.5 in the [facility] emergency department status post 1 unit packed red blood cell transfusion.    His cardiac BNP was 547.0 and high-sensitivity troponin tests 856 and 1395 concerning for NSTEMI.   Cardiology was consulted.. Patient was felt to be suffering from an NSTEMI but due to the patient's ongoing bleeding cardiology advised not to proceed with heparin or aspirin therapy.  The hospitalist group was called and patient was admitted to the hospitalist service.  Patient was then transferred to [facility] for further medical management after it was determined that patient was not going to undergo immediate invasive cardiac intervention.  Upon arrival to [facility] hemoglobin was noted to be 6.8 the evening of 10/15 prompting 1 additional unit of packed red blood cells to be transfused.   Assessment and Plan: * NSTEMI (non-ST elevated myocardial infarction) (HCC) High-sensitivity troponin peaked at 4814 and is now downtrending with last reading of 3357 morning of 10/16. Patient remains chest pain-free. It is my likely that the patient's severe anemia due to ongoing  hematuria in the setting of severe aortic stenosis prompted the patient's NSTEMI. Avoiding aspirin or heparin therapy due to substantial intermittent hematuria per the advisement of cardiology Cardiology following, their input is appreciated. Patient will likely need cardiac catheterization in preparation for aortic valve replacement during this hospitalization.  Acute blood loss anemia Acute blood loss anemia due to substantial intermittent gross hematuria over the past several weeks Patient received 1 unit of packed blood cell transfusion in the [facility] emergency department, and additional unit upon arrival to [facility] stepdown unit on 10/15.  Patient is receiving 1 additional unit on 10/17 for total of 3. Transfusing to target hemoglobin of 8 considering concurrent severe aortic stenosis Performing serial CBCs Obtaining stool Hemoccult Avoiding aspirin or heparin at this time SCDs for DVT prophylaxis.  Severe aortic stenosis Known history of severe aortic stenosis with echocardiogram during this hospitalization revealing aortic valve area 0.89 cm Cardiology feels that the aortic stenosis will likely need to be addressed via TAVR before the patient proceeds with prostate surgery. Supportive care for now, transfusing to target hemoglobin of 8 per my discussion with Dr. Mayford Knife today.  BPH with obstruction/lower urinary tract symptoms Patient has had an indwelling Foley catheter since early September due to urinary obstruction secondary to severe BPH Patient reports being due to have surgery 10/25 although this will likely need to be postponed  Cardiology recommending rectifying severe arctic stenosis via TAVR prior to proceeding. Keeping current Foley catheter in place Avoiding anticoagulants so as not to provoke bleeding.  Chronic diastolic CHF (congestive heart failure) (HCC) Rales and wheezing continue.   Giving an additional small dose of 20 mg of intravenous Lasix with  the  third unit of packed red blood cell transfusion today.   Taking great caution considering severe aortic stenosis.  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  Dyslipidemia, goal LDL below 70 Continue statin therapy  Acute respiratory failure with hypoxia (HCC) Acute hypoxic respiratory failure likely secondary to severe anemia  Weaning O2 as hemoglobin has improved  Nicotine dependence, cigarettes, uncomplicated Counseling daily on cessation.    Subjective:  Patient currently denies chest pain.  Patient denies any blood in his stool.  Patient denying any recurrent hematuria.  Physical Exam:  Vitals:   10/05/23 1808 10/05/23 1900 10/05/23 1930 10/05/23 2000  BP:    (!) 108/50  Pulse:  81 61 73  Resp:  (!) 21 (!) 22 (!) 21  Temp:    98.6 F (37 C)  TempSrc:    Oral  SpO2: 96% 95% 98% 96%  Weight:      Height:        Constitutional: Awake alert and oriented x3, no associated distress.   Skin: no rashes, no lesions, good skin turgor noted. Eyes: Pupils are equally reactive to light.  No evidence of scleral icterus or conjunctival pallor.  ENMT: Moist mucous membranes noted.  Posterior pharynx clear of any exudate or lesions.   Respiratory: Notable bibasilar rales with intermittent expiratory wheezing in the upper fields.  Normal respiratory effort. No accessory muscle use.  Cardiovascular: 3 out of 6 systolic murmur heard best over the aortic valve.,  Regular rate and rhythm.  No extremity edema. 2+ pedal pulses. No carotid bruits.  Abdomen: Abdomen is soft and nontender.  No evidence of intra-abdominal masses.  Positive bowel sounds noted in all quadrants.   Musculoskeletal: No joint deformity upper and lower extremities. Good ROM, no contractures. Normal muscle tone.     Data Reviewed:  I have personally reviewed and interpreted labs, imaging.  Significant findings  are   CBC: Recent Labs  Lab 10/03/23 0412 10/03/23 1648 10/04/23 0509 10/04/23 0809 10/04/23 1905 10/05/23 0253 10/05/23 1929  WBC 10.3  --  8.6  --  11.9* 11.0*  --   HGB 6.5*   < > 7.4* 8.0* 7.7* 7.4* 9.1*  HCT 20.3*   < > 23.5* 25.0* 24.6* 23.4* 29.0*  MCV 89.4  --  90.7  --  91.8 91.4  --   PLT 240  --  187  --  211 180  --    < > = values in this interval not displayed.   Basic Metabolic Panel: Recent Labs  Lab 10/03/23 0412 10/04/23 0509 10/05/23 0253  NA 134* 139 134*  K 3.6 4.1 4.3  CL 103 104 99  CO2 24 26 26   GLUCOSE 125* 159* 122*  BUN 18 29* 40*  CREATININE 0.81 0.79 0.94  CALCIUM 7.9* 8.3* 8.0*  MG  --  2.2  --    GFR: Estimated Creatinine Clearance: 59.8 mL/min (by C-G formula based on SCr of 0.94 mg/dL). Liver Function Tests: Recent Labs  Lab 10/03/23 0412 10/04/23 0509 10/05/23 0253  AST 24 24 19   ALT 15 14 16   ALKPHOS 52 42 39  BILITOT 0.6 0.5 0.3  PROT 5.9* 5.5* 5.3*  ALBUMIN 3.0* 2.7* 2.8*    Coagulation Profile: Recent Labs  Lab 10/03/23 0412 10/04/23 0509 10/05/23 0253  INR 1.1 1.1 1.1     EKG/Telemetry: Personally reviewed.  Rhythm is normal sinus rhythm with heart  rate of 71 bpm..  No dynamic ST segment changes appreciated.   Code Status:  Full code.  Code status decision has been confirmed with: patient Family Communication: Wife is at the bedside who has been updated on plan of care.   Severity of Illness:  The appropriate patient status for this patient is INPATIENT. Inpatient status is judged to be reasonable and necessary in order to provide the required intensity of service to ensure the patient's safety. The patient's presenting symptoms, physical exam findings, and initial radiographic and laboratory data in the context of their chronic comorbidities is felt to place them at high risk for further clinical deterioration. Furthermore, it is not anticipated that the patient will be medically stable for discharge from  the hospital within 2 midnights of admission.   * I certify that at the point of admission it is my clinical judgment that the patient will require inpatient hospital care spanning beyond 2 midnights from the point of admission due to high intensity of service, high risk for further deterioration and high frequency of surveillance required.*  Time spent:  57 minutes  Author:  Marinda Elk MD  10/05/2023 8:23 PM

## 2023-10-05 NOTE — Plan of Care (Signed)
Problem: Clinical Measurements: Goal: Respiratory complications will improve Outcome: Progressing   Problem: Nutrition: Goal: Adequate nutrition will be maintained Outcome: Progressing   Problem: Pain Managment: Goal: General experience of comfort will improve Outcome: Progressing   Problem: Safety: Goal: Ability to remain free from injury will improve Outcome: Progressing

## 2023-10-05 NOTE — Progress Notes (Signed)
Transferring patient via wheelchair to 1435 from 1232 with RN/transport.  Patient is going on telemetry monitor alert and oriented to person. Place, time and situation.  He will let his wife know his new room number

## 2023-10-05 NOTE — Assessment & Plan Note (Signed)
Counseling daily on cessation

## 2023-10-05 NOTE — Addendum Note (Signed)
Addended by: Sharlene Dory on: 10/05/2023 03:30 PM   Modules accepted: Level of Service

## 2023-10-06 DIAGNOSIS — D62 Acute posthemorrhagic anemia: Secondary | ICD-10-CM | POA: Diagnosis not present

## 2023-10-06 DIAGNOSIS — J9601 Acute respiratory failure with hypoxia: Secondary | ICD-10-CM | POA: Diagnosis not present

## 2023-10-06 DIAGNOSIS — I214 Non-ST elevation (NSTEMI) myocardial infarction: Secondary | ICD-10-CM | POA: Diagnosis not present

## 2023-10-06 DIAGNOSIS — I350 Nonrheumatic aortic (valve) stenosis: Secondary | ICD-10-CM | POA: Diagnosis not present

## 2023-10-06 LAB — BPAM RBC
Blood Product Expiration Date: 202411102359
Blood Product Expiration Date: 202411102359
ISSUE DATE / TIME: 202410160016
ISSUE DATE / TIME: 202410171223
Unit Type and Rh: 6200
Unit Type and Rh: 6200

## 2023-10-06 LAB — CBC
HCT: 27.1 % — ABNORMAL LOW (ref 39.0–52.0)
Hemoglobin: 8.3 g/dL — ABNORMAL LOW (ref 13.0–17.0)
MCH: 28.3 pg (ref 26.0–34.0)
MCHC: 30.6 g/dL (ref 30.0–36.0)
MCV: 92.5 fL (ref 80.0–100.0)
Platelets: 170 10*3/uL (ref 150–400)
RBC: 2.93 MIL/uL — ABNORMAL LOW (ref 4.22–5.81)
RDW: 15.1 % (ref 11.5–15.5)
WBC: 10.3 10*3/uL (ref 4.0–10.5)
nRBC: 0.4 % — ABNORMAL HIGH (ref 0.0–0.2)

## 2023-10-06 LAB — FOLATE: Folate: 10.6 ng/mL (ref >5.9)

## 2023-10-06 LAB — TYPE AND SCREEN
ABO/RH(D): AB POS
Antibody Screen: NEGATIVE
Unit division: 0
Unit division: 0

## 2023-10-06 LAB — COMPREHENSIVE METABOLIC PANEL
ALT: 26 U/L (ref 0–44)
AST: 30 U/L (ref 15–41)
Albumin: 2.7 g/dL — ABNORMAL LOW (ref 3.5–5.0)
Alkaline Phosphatase: 39 U/L (ref 38–126)
Anion gap: 7 (ref 5–15)
BUN: 26 mg/dL — ABNORMAL HIGH (ref 8–23)
CO2: 25 mmol/L (ref 22–32)
Calcium: 7.8 mg/dL — ABNORMAL LOW (ref 8.9–10.3)
Chloride: 103 mmol/L (ref 98–111)
Creatinine, Ser: 0.65 mg/dL (ref 0.61–1.24)
GFR, Estimated: >60 mL/min (ref >60)
Glucose, Bld: 92 mg/dL (ref 70–99)
Potassium: 3.9 mmol/L (ref 3.5–5.1)
Sodium: 135 mmol/L (ref 135–145)
Total Bilirubin: 0.4 mg/dL (ref 0.3–1.2)
Total Protein: 5.3 g/dL — ABNORMAL LOW (ref 6.5–8.1)

## 2023-10-06 LAB — MAGNESIUM: Magnesium: 2.5 mg/dL — ABNORMAL HIGH (ref 1.7–2.4)

## 2023-10-06 LAB — IRON AND TIBC
Iron: 19 ug/dL — ABNORMAL LOW (ref 45–182)
Saturation Ratios: 5 % — ABNORMAL LOW (ref 17.9–39.5)
TIBC: 410 ug/dL (ref 250–450)
UIBC: 391 ug/dL

## 2023-10-06 LAB — OCCULT BLOOD X 1 CARD TO LAB, STOOL: Fecal Occult Bld: NEGATIVE

## 2023-10-06 LAB — VITAMIN B12: Vitamin B-12: 212 pg/mL (ref 180–914)

## 2023-10-06 MED ORDER — VITAMIN B-12 1000 MCG PO TABS
1000.0000 ug | ORAL_TABLET | Freq: Every day | ORAL | Status: DC
  Administered 2023-10-07 – 2023-10-08 (×2): 1000 ug via ORAL
  Filled 2023-10-06 (×2): qty 1

## 2023-10-06 MED ORDER — IPRATROPIUM-ALBUTEROL 0.5-2.5 (3) MG/3ML IN SOLN
3.0000 mL | Freq: Three times a day (TID) | RESPIRATORY_TRACT | Status: DC
  Administered 2023-10-06 – 2023-10-08 (×8): 3 mL via RESPIRATORY_TRACT
  Filled 2023-10-06 (×8): qty 3

## 2023-10-06 MED ORDER — SODIUM CHLORIDE 0.9 % IV SOLN
200.0000 mg | Freq: Once | INTRAVENOUS | Status: AC
  Administered 2023-10-06: 200 mg via INTRAVENOUS
  Filled 2023-10-06: qty 10

## 2023-10-06 MED ORDER — CYANOCOBALAMIN 1000 MCG/ML IJ SOLN
1000.0000 ug | Freq: Once | INTRAMUSCULAR | Status: AC
  Administered 2023-10-06: 1000 ug via INTRAMUSCULAR
  Filled 2023-10-06 (×2): qty 1

## 2023-10-06 NOTE — Plan of Care (Signed)
CHL Tonsillectomy/Adenoidectomy, Postoperative PEDS care plan entered in error.

## 2023-10-06 NOTE — Progress Notes (Signed)
Mobility Specialist - Progress Note  Pre-mobility: 86 bpm HR, 94% SpO2 During mobility: 92 bpm HR, 94% SpO2 Post-mobility: 82 bpm HR, 95% SPO2   10/06/23 1509  Mobility  Activity Ambulated independently in hallway  Level of Assistance Independent after set-up  Assistive Device None  Distance Ambulated (ft) 350 ft  Range of Motion/Exercises Active  Activity Response Tolerated well  Mobility Referral Yes  $Mobility charge 1 Mobility  Mobility Specialist Start Time (ACUTE ONLY) 1450  Mobility Specialist Stop Time (ACUTE ONLY) 1509  Mobility Specialist Time Calculation (min) (ACUTE ONLY) 19 min   Pt was found in bed and agreeable to ambulate. No complaints with session. At EOS returned to bed with all needs met. Call bell in reach and wife in room.  Billey Chang Mobility Specialist

## 2023-10-06 NOTE — Progress Notes (Signed)
Rounding Note    Patient Name: Kyle Matthews Date of Encounter: 10/06/2023  [facility] Cardiologist: Maisie Fus, MD   Subjective   No acute overnight events.  Denies any CP but had SOB with ambulation this am requiring O2 .  O2 sats 94% on 2L  Hbg 8.3 this am  Inpatient Medications    Scheduled Meds:  azithromycin  500 mg Oral Daily   carvedilol  3.125 mg Oral BID WC   Chlorhexidine Gluconate Cloth  6 each Topical Daily   finasteride  5 mg Oral QPM   ipratropium-albuterol  3 mL Nebulization BID   predniSONE  40 mg Oral Q breakfast   rosuvastatin  40 mg Oral QPM   Continuous Infusions:  cefTRIAXone (ROCEPHIN)  IV Stopped (10/05/23 0932)   PRN Meds: acetaminophen **OR** acetaminophen, bisacodyl, morphine injection, ondansetron **OR** ondansetron (ZOFRAN) IV, oxyCODONE, polyethylene glycol, traZODone   Vital Signs    Vitals:   10/05/23 2200 10/05/23 2327 10/06/23 0320 10/06/23 0500  BP: (!) 111/40 (!) 140/68 124/84   Pulse: 62 71 73   Resp: 17 18 18    Temp:  98.2 F (36.8 C) 98.4 F (36.9 C)   TempSrc:  Oral Oral   SpO2: 96% 95% 94%   Weight:    71.5 kg  Height:        Intake/Output Summary (Last 24 hours) at 10/06/2023 0712 Last data filed at 10/06/2023 0535 Gross per 24 hour  Intake 1091.46 ml  Output 1925 ml  Net -833.54 ml      10/06/2023    5:00 AM 10/05/2023    5:00 AM 10/04/2023    5:00 AM  Last 3 Weights  Weight (lbs) 157 lb 10.1 oz 155 lb 3.3 oz 153 lb 7 oz  Weight (kg) 71.5 kg 70.4 kg 69.6 kg      Telemetry    NSR - Personally Reviewed  ECG    No new EKG to review.- Personally Reviewed  Physical Exam   GEN: Well nourished, well developed in no acute distress HEENT: Normal NECK: No JVD; carotid bruits related to radiation of SM from AS LYMPHATICS: No lymphadenopathy CARDIAC:RRR, no rubs, gallops 2/6 late peaking SM at RUSB to LLSB RESPIRATORY:  Clear to auscultation without rales, wheezing or rhonchi  ABDOMEN:  Soft, non-tender, non-distended MUSCULOSKELETAL:  trace edema; No deformity  SKIN: Warm and dry NEUROLOGIC:  Alert and oriented x 3 PSYCHIATRIC:  Normal affect .   Labs    High Sensitivity Troponin:   Recent Labs  Lab 10/03/23 0412 10/03/23 0542 10/03/23 1521 10/04/23 0505  TROPONINIHS 856* 1,395* 4,814* 3,357*     Chemistry Recent Labs  Lab 10/04/23 0509 10/05/23 0253 10/06/23 0354  NA 139 134* 135  K 4.1 4.3 3.9  CL 104 99 103  CO2 26 26 25   GLUCOSE 159* 122* 92  BUN 29* 40* 26*  CREATININE 0.79 0.94 0.65  CALCIUM 8.3* 8.0* 7.8*  MG 2.2  --  2.5*  PROT 5.5* 5.3* 5.3*  ALBUMIN 2.7* 2.8* 2.7*  AST 24 19 30   ALT 14 16 26   ALKPHOS 42 39 39  BILITOT 0.5 0.3 0.4  GFRNONAA >60 >60 >60  ANIONGAP 9 9 7     Lipids  Recent Labs  Lab 10/04/23 0509  CHOL 112  TRIG 44  HDL 49  LDLCALC 54  CHOLHDL 2.3    Hematology Recent Labs  Lab 10/04/23 1905 10/05/23 0253 10/05/23 1929 10/06/23 0354  WBC 11.9* 11.0*  --  10.3  RBC 2.68* 2.56*  --  2.93*  HGB 7.7* 7.4* 9.1* 8.3*  HCT 24.6* 23.4* 29.0* 27.1*  MCV 91.8 91.4  --  92.5  MCH 28.7 28.9  --  28.3  MCHC 31.3 31.6  --  30.6  RDW 15.2 15.3  --  15.1  PLT 211 180  --  170   Thyroid No results for input(s): "TSH", "FREET4" in the last 168 hours.  BNP Recent Labs  Lab 10/03/23 0412 10/04/23 0509  BNP 547.0* 501.3*    DDimer No results for input(s): "DDIMER" in the last 168 hours.   Radiology    DG Chest 1 View  Result Date: 10/05/2023 CLINICAL DATA:  Follow up pulmonary edema. EXAM: CHEST  1 VIEW COMPARISON:  Radiographs 10/03/2023.  CT 10/03/2023. FINDINGS: 0847 hours. Stable cardiomegaly and aortic atherosclerosis. Interstitial pulmonary edema has mildly improved. The basilar airspace opacity seen on CT are not well visualized radiographically, although do not appear progressive. No significant pleural effusion or pneumothorax. No acute osseous findings identified. There is an old fracture of the left  proximal humerus. Telemetry leads overlie the chest. IMPRESSION: Mildly improved interstitial pulmonary edema. The basilar airspace opacities seen on CT are not well visualized radiographically. Electronically Signed   By: Carey Bullocks M.D.   On: 10/05/2023 12:44    Cardiac Studies   Echocardiogram 10/03/2023: Impressions: 1. Left ventricular ejection fraction, by estimation, is 55 to 60%. The  left ventricle has normal function. The left ventricle has no regional  wall motion abnormalities. There is mild left ventricular hypertrophy.  Left ventricular diastolic parameters  are indeterminate. Elevated left atrial pressure.   2. Right ventricular systolic function is normal. The right ventricular  size is normal. There is mildly elevated pulmonary artery systolic  pressure.   3. Left atrial size was moderately dilated.   4. Right atrial size was moderately dilated.   5. The mitral valve is abnormal. Mild to moderate mitral valve  regurgitation. No evidence of mitral stenosis.   6. The tricuspid valve is abnormal.   7. The aortic valve was not well visualized. There is severe calcifcation  of the aortic valve. There is severe thickening of the aortic valve.  Aortic valve regurgitation is moderate to severe. Severe aortic valve  stenosis. Severe aortic stenosis is  present. Aortic valve mean gradient measures 42.0 mmHg. Aortic valve peak  gradient measures 69.2 mmHg. Aortic valve area, by VTI measures 0.89 cm.   8. The inferior vena cava is dilated in size with >50% respiratory  variability, suggesting right atrial pressure of 8 mmHg.    Patient Profile     76 y.o. male with a history of severe aortic stenosis/ moderate aortic regurgitation, chronic RBBB, hypertension, hyperlipidemia, CVA in 2007, BPH with chronic bladder outlet obstruction and chronic indwelling Foley, and tobacco abuse who presented to [facility] on 10/03/2023 with intermittent chest pain and shortness of  breath for the past 3 weeks. O2 sats were in the 80s. Work-up showed multifocal pneumonia and severe anemia with hemoglobin of 6.5 (secondary to gross hematuria). He also ruled in for NSTEMI with high-sensitivity troponin peaking at 4,814. Cardiology was consulted and this was fle to tbe secondary to demand ischemia in setting of acute anemia and acute hypoxic respiratory failure secondary to multifocal pneumonia. He was not felt to be a cardiac catheterization candidate in light of his severe anemia and hematuria. He was tansferred to [facility] for further management.  Assessment & Plan  NSTEMI High-sensitivity troponin peaked at 4,814 and trended down. EKG today does showes significant ST depression and T wave inversions in leads V3-V5 and milder changes in inferior leads. Echo showed LVEF of 55-60% with no reginal wall motion abnormalities.  - No recurrent chest pain after blood transfusions. - Continue beta-blocker and statin. - No IV Heparin or Aspirin given severe anemia and hematuria while on Aspirin.  - Symptoms and troponin elevation felt to be more consistent with demand ischemia from severe anemia and acute hypoxic respiratory failure with underlying severe aortic stenosis rather than true ACS.  -He does have coronary calcifications noted on chest CTA and he has multiple CV risk factors including prior CVA.  -He is also in need of a prostate surgery.  -recommend diagnostic R/LHC for TAVR/ SAVR work-up once hemoglobin has stabilized and after he has been treated for pneumonia.  -Hgb stable at 8.3 this am after transfusion -if Hbg remains stable over the weekend then will plan for right and [facility] on Monday -no ASA due to anemia -continue Crestor 40mg  daily and Carvedilol 3.125mg  BID  Severe Aortic Stenosis  Initially diagnosed with severe AS at [facility] in 09/2022. Echo this admission showed LVEF of 55-60% with normal wall motion, normal RV, moderate biatrial enlargement, and severe aortic  stenosis with mean gradient of .  - BNP is elevated at 547 >> 501. However, he has not looked significantly volume overloaded during admission -he appears euvolemic on exam this am - Recommend R/LHC for TAVR/ SAVR work-up once hemoglobin has stabilized . Will then need evaluation by Structural Heart Team. He is in need of a prostate surgery and may need their input on timing of valve repair (before or after prostate surgery).   Hypertension  - BP stable at 124/87mmHg - continue Carvedilol 3.125mg  BID.  - Agree with holding home Amlodipine-Benazepril.  - Want to avoid hypotension given severe aortic stenosis.  Hyperlipidemia Lipid panel this admission: Total Cholesterol 112, Triglycerides 44, HDL 49, LDL 54. - Continue Crestor 40mg  daily.  Symptomatic Anemia Hematuria Hemoglobin 6.5 on admission secondary to gross hematuria since having Foley placed in 07/2023. Patient states hematuria did stop after he stopped his Aspirin 5 days prior to admission.  - has received a total of 3 units PRBS (1 unit yesterday) - Hemoglobin up to 8.3 this am - Continue to hold Aspirin. - Recommend keeping hemoglobin >8 given severe aortic stenosis. - recommend watching over the weekend and if Hbg remains stable then plan cath on Monday>>he will need Heparin for the cath so want to make sure there will be no issues with bleeding - Management per primary team.  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  I have personally spent 30 minutes involved in face-to-face and non-face-to-face activities for this patient on the day of the visit. Professional time spent includes the following activities: Preparing to see the patient (review of tests), Obtaining and/or reviewing separately obtained history (admission/discharge record), Performing a medically appropriate examination and/or evaluation ,  Ordering medications/tests/procedures, referring and communicating with other health care professionals, Documenting clinical information in the EMR, Independently interpreting results (not separately reported), Communicating results to the patient/family/caregiver, Counseling and educating the patient/family/caregiver and Care coordination (not separately reported).   For questions or updates, please contact [facility] Please consult [email or web address] for contact info under        Signed, Armanda Magic, MD  10/06/2023, 7:12 AM

## 2023-10-06 NOTE — Progress Notes (Signed)
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  Patient was found to have a hemoglobin of  6.5 in the [facility] emergency department status post 1 unit packed red blood cell transfusion.    His cardiac BNP was 547.0 and high-sensitivity troponin tests 856 and 1395 concerning for NSTEMI.   Cardiology was consulted. Patient was felt to be suffering from an NSTEMI but due to the patient's ongoing bleeding cardiology advised not to proceed with heparin or aspirin therapy.  The hospitalist group was called and patient was admitted to the hospitalist service.  Patient was then transferred to [facility] for further medical management after it was determined that patient was not going to undergo immediate invasive cardiac intervention.  Upon arrival to [facility] hemoglobin was noted to be 6.8 the evening of 10/15 prompting 1 additional unit of packed red blood cells to be transfused.  Troponins gradually decreased after blood transfusion.  Patient remained off of heparin and aspirin.  Cardiology assisted with comanagement and felt that the patient's severe aortic stenosis was playing a major role.  They recommended targeting a hemoglobin of greater than 8 and  transfusing as necessary.  In the days that followed, patient clinically improved with tentative plans to attempt to proceed evaluation with the structural heart team in preparation for possible TAVR during this hospitalization.    Assessment and Plan: * NSTEMI (non-ST elevated myocardial infarction) (HCC) High-sensitivity troponin peaked at 4814 and is now downtrending with last reading of 3357 morning of 10/16. Patient remains chest pain-free. It is my likely that the patient's severe anemia due to ongoing hematuria in the setting of severe aortic stenosis prompted the patient's NSTEMI. Avoiding aspirin or heparin therapy due to substantial intermittent hematuria per the advisement of cardiology Cardiology following, their input is appreciated. Patient will likely need cardiac catheterization in preparation for aortic valve replacement during this hospitalization, possibly Monday at [facility].  Acute blood loss anemia Acute blood loss anemia due to substantial intermittent gross hematuria over the past several weeks Patient received 1 unit of packed blood cell transfusion in the [facility] emergency department on 10/15, one unit upon arrival to [facility] stepdown unit on 10/1 and 1 additional unit on 10/17 No evidence currently of active hematuria.  No clinical evidence of other sources of bleeding.   Transfusing to target hemoglobin of 8 considering concurrent severe aortic stenosis in preparation for eventual cardiac catheterization and TAVR, possibly Monday. Performing serial CBCs Still occult negative. Avoiding aspirin or heparin at this time SCDs for DVT prophylaxis.  Severe aortic stenosis Known history of severe aortic stenosis with echocardiogram during this hospitalization revealing aortic valve area 0.89 cm Cardiology feels that the aortic stenosis will likely need to be addressed via TAVR before the patient proceeds with prostate surgery. Transfusing to target  hemoglobin of 8 per  my discussion with Dr. Mayford Knife today in anticipation for possible cardiac catheterization on Monday at [facility] and eventual TAVR.  BPH with obstruction/lower urinary tract symptoms Patient has had an indwelling Foley catheter since early September due to urinary obstruction secondary to severe BPH Patient has communicated to his outpatient urologist his current condition and procedure will now be postponed. Cardiology recommending rectifying severe arctic stenosis via TAVR prior to proceeding with urologic surgery. Keeping current Foley catheter in place Continue Proscar Avoiding anticoagulants so as not to provoke bleeding.  Chronic diastolic CHF (congestive heart failure) (HCC) Improving rales and wheezing. Patient near euvolemia.   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  Dyslipidemia, goal LDL below 70 Continue statin therapy  Acute respiratory failure with hypoxia (HCC) Patient reports at least a 2-1/66-month history of progressively worsening dyspnea on exertion in the home setting.   This is combined with substantial worsening as patient became profoundly anemic. Weaning O2 as hemoglobin has improved  Nicotine dependence, cigarettes, uncomplicated Counseling daily on cessation.    Subjective:  Patient currently denies chest pain.  Patient can use to complain of some shortness of breath at rest, worse with exertion.  Patient denies any chest pain or cough at this time.  Physical Exam:  Vitals:   10/06/23 0800 10/06/23 0848 10/06/23 1151 10/06/23 1508  BP:   135/62   Pulse:   71   Resp: (!) 22  20   Temp:   98.2 F (36.8 C)   TempSrc:   Oral   SpO2:  95% 93% 95%  Weight:      Height:        Constitutional: Awake alert and oriented x3, no associated distress.   Skin: no rashes, no lesions, good skin turgor noted. Eyes: Pupils are equally  reactive to light.  No evidence of scleral icterus or conjunctival pallor.  ENMT: Moist mucous membranes noted.  Posterior pharynx clear of any exudate or lesions.   Respiratory: Wheezing has essentially resolved.  Patient continues to exhibit mild bibasilar rales.  Normal respiratory effort. No accessory muscle use.  Cardiovascular: 3 out of 6 systolic murmur heard best over the aortic valve.,  Regular rate and rhythm.  No extremity edema. 2+ pedal pulses. No carotid bruits.  Abdomen: Abdomen is soft and nontender.  No evidence of intra-abdominal masses.  Positive bowel sounds noted in all quadrants.   Musculoskeletal: No joint deformity upper and lower extremities. Good ROM, no contractures. Normal muscle tone.     Data Reviewed:  I have personally reviewed and interpreted labs, imaging.  Significant findings are   CBC: Recent Labs  Lab 10/03/23 0412 10/03/23 1648 10/04/23 0509 10/04/23 0809 10/04/23 1905 10/05/23 0253 10/05/23 1929 10/06/23 0354  WBC 10.3  --  8.6  --  11.9* 11.0*  --  10.3  HGB 6.5*   < > 7.4* 8.0* 7.7* 7.4* 9.1* 8.3*  HCT 20.3*   < > 23.5* 25.0* 24.6* 23.4* 29.0* 27.1*  MCV 89.4  --  90.7  --  91.8 91.4  --  92.5  PLT 240  --  187  --  211 180  --  170   < > = values in this interval not displayed.   Basic Metabolic Panel: Recent Labs  Lab 10/03/23 0412 10/04/23 0509 10/05/23 0253 10/06/23 0354  NA 134* 139 134* 135  K 3.6 4.1 4.3 3.9  CL 103  104 99 103  CO2 24 26 26 25   GLUCOSE 125* 159* 122* 92  BUN 18 29* 40* 26*  CREATININE 0.81 0.79 0.94 0.65  CALCIUM 7.9* 8.3* 8.0* 7.8*  MG  --  2.2  --  2.5*   GFR: Estimated Creatinine Clearance: 70.8 mL/min (by C-G formula based on SCr of 0.65 mg/dL). Liver Function Tests: Recent Labs  Lab 10/03/23 0412 10/04/23 0509 10/05/23 0253 10/06/23 0354  AST 24 24 19 30   ALT 15 14 16 26   ALKPHOS 52 42 39 39  BILITOT 0.6 0.5 0.3 0.4  PROT 5.9* 5.5* 5.3* 5.3*  ALBUMIN 3.0* 2.7* 2.8* 2.7*     Coagulation Profile: Recent Labs  Lab 10/03/23 0412 10/04/23 0509 10/05/23 0253  INR 1.1 1.1 1.1     Telemetry: Personally reviewed.  Rhythm is normal sinus rhythm with heart rate of 74 bpm..  No dynamic ST segment changes appreciated.   Code Status:  Full code.  Code status decision has been confirmed with: patient Family Communication: Wife is at the bedside who has been updated on plan of care.   Severity of Illness:  The appropriate patient status for this patient is INPATIENT. Inpatient status is judged to be reasonable and necessary in order to provide the required intensity of service to ensure the patient's safety. The patient's presenting symptoms, physical exam findings, and initial radiographic and laboratory data in the context of their chronic comorbidities is felt to place them at high risk for further clinical deterioration. Furthermore, it is not anticipated that the patient will be medically stable for discharge from the hospital within 2 midnights of admission.   * I certify that at the point of admission it is my clinical judgment that the patient will require inpatient hospital care spanning beyond 2 midnights from the point of admission due to high intensity of service, high risk for further deterioration and high frequency of surveillance required.*  Time spent:  52 minutes  Author:  Marinda Elk MD  10/06/2023 6:39 PM

## 2023-10-07 DIAGNOSIS — R310 Gross hematuria: Secondary | ICD-10-CM | POA: Insufficient documentation

## 2023-10-07 DIAGNOSIS — I214 Non-ST elevation (NSTEMI) myocardial infarction: Secondary | ICD-10-CM | POA: Diagnosis not present

## 2023-10-07 LAB — COMPREHENSIVE METABOLIC PANEL
ALT: 32 U/L (ref 0–44)
AST: 24 U/L (ref 15–41)
Albumin: 2.6 g/dL — ABNORMAL LOW (ref 3.5–5.0)
Alkaline Phosphatase: 39 U/L (ref 38–126)
Anion gap: 6 (ref 5–15)
BUN: 20 mg/dL (ref 8–23)
CO2: 27 mmol/L (ref 22–32)
Calcium: 8.2 mg/dL — ABNORMAL LOW (ref 8.9–10.3)
Chloride: 103 mmol/L (ref 98–111)
Creatinine, Ser: 0.66 mg/dL (ref 0.61–1.24)
GFR, Estimated: >60 mL/min (ref >60)
Glucose, Bld: 90 mg/dL (ref 70–99)
Potassium: 3.8 mmol/L (ref 3.5–5.1)
Sodium: 136 mmol/L (ref 135–145)
Total Bilirubin: 0.7 mg/dL (ref 0.3–1.2)
Total Protein: 5.3 g/dL — ABNORMAL LOW (ref 6.5–8.1)

## 2023-10-07 LAB — CBC
HCT: 28.8 % — ABNORMAL LOW (ref 39.0–52.0)
Hemoglobin: 8.9 g/dL — ABNORMAL LOW (ref 13.0–17.0)
MCH: 28.4 pg (ref 26.0–34.0)
MCHC: 30.9 g/dL (ref 30.0–36.0)
MCV: 92 fL (ref 80.0–100.0)
Platelets: 172 10*3/uL (ref 150–400)
RBC: 3.13 MIL/uL — ABNORMAL LOW (ref 4.22–5.81)
RDW: 14.6 % (ref 11.5–15.5)
WBC: 8.7 10*3/uL (ref 4.0–10.5)
nRBC: 0.3 % — ABNORMAL HIGH (ref 0.0–0.2)

## 2023-10-07 LAB — MAGNESIUM: Magnesium: 2.3 mg/dL (ref 1.7–2.4)

## 2023-10-07 MED ORDER — SODIUM CHLORIDE 0.9 % IV SOLN: 200.0000 mg | Freq: Once | INTRAVENOUS | Status: DC

## 2023-10-07 MED ORDER — PNEUMOCOCCAL 20-VAL CONJ VACC 0.5 ML IM SUSY
0.5000 mL | PREFILLED_SYRINGE | INTRAMUSCULAR | Status: AC
  Administered 2023-10-08: 0.5 mL via INTRAMUSCULAR
  Filled 2023-10-07: qty 0.5

## 2023-10-07 NOTE — Progress Notes (Signed)
Progress Note  Patient Name: Kyle Matthews Date of Encounter: 10/07/2023 Primary Cardiologist: Maisie Fus, MD   Subjective   Overnight Hgb stable. Patient notes no CP, SOB, Palpitations.  Vital Signs    Vitals:   10/07/23 0500 10/07/23 0609 10/07/23 0610 10/07/23 0618  BP:  139/77 139/77   Pulse:  71 73   Resp:  20 20   Temp:  98 F (36.7 C) 98 F (36.7 C)   TempSrc:  Oral Oral   SpO2:  97% 97% 95%  Weight: 71.2 kg     Height:        Intake/Output Summary (Last 24 hours) at 10/07/2023 0756 Last data filed at 10/07/2023 2952 Gross per 24 hour  Intake 570 ml  Output 3002 ml  Net -2432 ml   Filed Weights   10/05/23 0500 10/06/23 0500 10/07/23 0500  Weight: 70.4 kg 71.5 kg 71.2 kg    Physical Exam   GEN: No acute distress.   Neck: No JVD Cardiac: RRR,3/6 systolic murmur with absent A2, +2 radial pulses Respiratory: Decreased breath sounds bilaterally GI: Soft, nontender, non-distended  MS: No edema  Labs   Telemetry: SR   Chemistry Recent Labs  Lab 10/05/23 0253 10/06/23 0354 10/07/23 0403  NA 134* 135 136  K 4.3 3.9 3.8  CL 99 103 103  CO2 26 25 27   GLUCOSE 122* 92 90  BUN 40* 26* 20  CREATININE 0.94 0.65 0.66  CALCIUM 8.0* 7.8* 8.2*  PROT 5.3* 5.3* 5.3*  ALBUMIN 2.8* 2.7* 2.6*  AST 19 30 24   ALT 16 26 32  ALKPHOS 39 39 39  BILITOT 0.3 0.4 0.7  GFRNONAA >60 >60 >60  ANIONGAP 9 7 6      Hematology Recent Labs  Lab 10/05/23 0253 10/05/23 1929 10/06/23 0354 10/07/23 0403  WBC 11.0*  --  10.3 8.7  RBC 2.56*  --  2.93* 3.13*  HGB 7.4* 9.1* 8.3* 8.9*  HCT 23.4* 29.0* 27.1* 28.8*  MCV 91.4  --  92.5 92.0  MCH 28.9  --  28.3 28.4  MCHC 31.6  --  30.6 30.9  RDW 15.3  --  15.1 14.6  PLT 180  --  170 172    Cardiac EnzymesNo results for input(s): "TROPONINI" in the last 168 hours. No results for input(s): "TROPIPOC" in the last 168 hours.   BNP Recent Labs  Lab 10/03/23 0412 10/04/23 0509  BNP 547.0* 501.3*     DDimer No  results for input(s): "DDIMER" in the last 168 hours.   Cardiac Studies   Cardiac Studies & Procedures       ECHOCARDIOGRAM  ECHOCARDIOGRAM COMPLETE 10/03/2023  Narrative ECHOCARDIOGRAM REPORT    Patient Name:   NICKLES BLACKLEDGE Date of Exam: 10/03/2023 Medical Rec #:  [id number]  Height:       68.0 in Accession #:    [id number] Weight:       152.6 lb Date of Birth:  1946-04-05   BSA:          1.822 m Patient Age:    77 years   BP:           111/68 mmHg Patient Gender: M          HR:           94 bpm. [facility]  Procedure: 2D Echo, Cardiac Doppler and Color Doppler  Indications:    SBE  History:        Patient has  prior history of Echocardiogram examinations, most recent 05/02/2018. Acute MI, Arrythmias:RBBB, Signs/Symptoms:Chest Pain and Shortness of Breath; Risk Factors:Hypertension, Dyslipidemia and Current Smoker.  Sonographer:    Mikki Harbor Referring Phys: [id number] SHERI HAMMOCK  IMPRESSIONS   1. Left ventricular ejection fraction, by estimation, is 55 to 60%. The left ventricle has normal function. The left ventricle has no regional wall motion abnormalities. There is mild left ventricular hypertrophy. Left ventricular diastolic parameters are indeterminate. Elevated left atrial pressure. 2. Right ventricular systolic function is normal. The right ventricular size is normal. There is mildly elevated pulmonary artery systolic pressure. 3. Left atrial size was moderately dilated. 4. Right atrial size was moderately dilated. 5. The mitral valve is abnormal. Mild to moderate mitral valve regurgitation. No evidence of mitral stenosis. 6. The tricuspid valve is abnormal. 7. The aortic valve was not well visualized. There is severe calcifcation of the aortic valve. There is severe thickening of the aortic valve. Aortic valve regurgitation is moderate to severe. Severe aortic valve stenosis. Severe aortic stenosis is present. Aortic valve mean gradient  measures 42.0 mmHg. Aortic valve peak gradient measures 69.2 mmHg. Aortic valve area, by VTI measures 0.89 cm. 8. The inferior vena cava is dilated in size with >50% respiratory variability, suggesting right atrial pressure of 8 mmHg.  FINDINGS Left Ventricle: Left ventricular ejection fraction, by estimation, is 55 to 60%. The left ventricle has normal function. The left ventricle has no regional wall motion abnormalities. The left ventricular internal cavity size was normal in size. There is mild left ventricular hypertrophy. Left ventricular diastolic parameters are indeterminate. Elevated left atrial pressure.  Right Ventricle: The right ventricular size is normal. Right vetricular wall thickness was not well visualized. Right ventricular systolic function is normal. There is mildly elevated pulmonary artery systolic pressure. The tricuspid regurgitant velocity is 3.02 m/s, and with an assumed right atrial pressure of 8 mmHg, the estimated right ventricular systolic pressure is 44.5 mmHg.  Left Atrium: Left atrial size was moderately dilated.  Right Atrium: Right atrial size was moderately dilated.  Pericardium: There is no evidence of pericardial effusion.  Mitral Valve: The mitral valve is abnormal. Mild mitral annular calcification. Mild to moderate mitral valve regurgitation. No evidence of mitral valve stenosis. MV peak gradient, 11.4 mmHg. The mean mitral valve gradient is 6.0 mmHg.  Tricuspid Valve: The tricuspid valve is abnormal. Tricuspid valve regurgitation is mild . No evidence of tricuspid stenosis.  Aortic Valve: The aortic valve was not well visualized. There is severe calcifcation of the aortic valve. There is severe thickening of the aortic valve. There is severe aortic valve annular calcification. Aortic valve regurgitation is moderate to severe. Aortic regurgitation PHT measures 227 msec. Severe aortic stenosis is present. Aortic valve mean gradient measures 42.0 mmHg.  Aortic valve peak gradient measures 69.2 mmHg. Aortic valve area, by VTI measures 0.89 cm.  Pulmonic Valve: The pulmonic valve was not well visualized. Pulmonic valve regurgitation is not visualized. No evidence of pulmonic stenosis.  Aorta: The aortic root and ascending aorta are structurally normal, with no evidence of dilitation.  Venous: The inferior vena cava is dilated in size with greater than 50% respiratory variability, suggesting right atrial pressure of 8 mmHg.  IAS/Shunts: No atrial level shunt detected by color flow Doppler.   LEFT VENTRICLE PLAX 2D LVIDd:         4.80 cm      Diastology LVIDs:         3.20 cm  LV e' medial:    6.96 cm/s LV PW:         1.10 cm      LV E/e' medial:  18.8 LV IVS:        1.10 cm      LV e' lateral:   9.25 cm/s LVOT diam:     1.90 cm      LV E/e' lateral: 14.2 LV SV:         91 LV SV Index:   50 LVOT Area:     2.84 cm  LV Volumes (MOD) LV vol d, MOD A2C: 111.0 ml LV vol d, MOD A4C: 141.0 ml LV vol s, MOD A2C: 58.4 ml LV vol s, MOD A4C: 43.7 ml LV SV MOD A2C:     52.6 ml LV SV MOD A4C:     141.0 ml LV SV MOD BP:      76.4 ml  RIGHT VENTRICLE RV Basal diam:  3.80 cm RV Mid diam:    3.20 cm RV S prime:     15.30 cm/s TAPSE (M-mode): 2.2 cm  LEFT ATRIUM             Index        RIGHT ATRIUM           Index LA diam:        3.80 cm 2.09 cm/m   RA Area:     21.80 cm LA Vol (A2C):   93.7 ml 51.44 ml/m  RA Volume:   76.90 ml  42.22 ml/m LA Vol (A4C):   75.9 ml 41.67 ml/m LA Biplane Vol: 84.6 ml 46.44 ml/m AORTIC VALVE                     PULMONIC VALVE AV Area (Vmax):    0.93 cm      PV Vmax:       0.88 m/s AV Area (Vmean):   0.95 cm      PV Peak grad:  3.1 mmHg AV Area (VTI):     0.89 cm AV Vmax:           416.00 cm/s AV Vmean:          286.667 cm/s AV VTI:            1.020 m AV Peak Grad:      69.2 mmHg AV Mean Grad:      42.0 mmHg LVOT Vmax:         137.00 cm/s LVOT Vmean:        95.600 cm/s LVOT VTI:           0.320 m LVOT/AV VTI ratio: 0.31 AI PHT:            227 msec  AORTA Ao Root diam: 3.30 cm Ao Asc diam:  3.40 cm  MITRAL VALVE                TRICUSPID VALVE MV Area (PHT): 3.63 cm     TR Peak grad:   36.5 mmHg MV Peak grad:  11.4 mmHg    TR Vmax:        302.00 cm/s MV Mean grad:  6.0 mmHg MV Vmax:       1.68 m/s     SHUNTS MV Vmean:      111.0 cm/s   Systemic VTI:  0.32 m MV Decel Time: 209 msec     Systemic Diam: 1.90 cm MV E velocity: 131.00 cm/s MV A  velocity: 130.00 cm/s MV E/A ratio:  1.01  Dina Rich MD Electronically signed by Dina Rich MD Signature Date/Time: 10/03/2023/12:33:39 PM    Final    MONITORS  LONG TERM MONITOR (3-14 DAYS) 08/26/2020             Assessment & Plan   NSTEMI - No recurrent chest pain after blood transfusions. - Continue beta-blocker and statin. - Symptoms and troponin elevation felt to be more consistent with demand ischemia from severe anemia and acute hypoxic respiratory failure with underlying severe aortic stenosis rather than true ACS.  -He does have coronary calcifications noted on chest CTA and he has multiple CV risk factors including prior CVA.  -He is also in need of a prostate surgery.  - he was planned for  [facility] for Monday if  stabilized and if improving O2 s/p PNA treatment -Hgb stable at 8.9 with no need for transfusion -continue Crestor 40mg  daily and Carvedilol 3.125mg  BID   Severe Aortic Stenosis  - with mean gradient of .  -cath plan as above, anemia appears related to hematuria, will send hemolysis labs tomorrow -given the need to plan timing of his prostate surgery for AKI/swelling hematuria (originally 10/13/23), expediting his work up for AS evaluation is important for multi-disciplinary discussion of timing of his interventions  Hypertension  - continue Carvedilol 3.125mg  BID.  - planned to home Amlodipine-Benazepril prior to cath   Hyperlipidemia - Continue Crestor 40mg  daily, LDL at go    Symptomatic Anemia Hematuria - Hemoglobin 6.5 on admission secondary to gross hematuria since having Foley placed in 07/2023. Patient states hematuria did stop after he stopped his Aspirin 5 days prior to admission.  - has received a total of 3 units PRBS (1 unit yesterday) - Recommend keeping hemoglobin >8 given severe aortic stenosis. - planned for cath on Monday by Dr. Mayford Knife - Management per primary team.   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      For questions or updates, please contact [facility] Please consult [email or web address] for contact info under Cardiology/STEMI.      Riley Lam, MD FASE [facility] Cardiologist [facility]  [address] [place], [address] [place], [place] [address] [phone]  7:56 AM

## 2023-10-07 NOTE — Assessment & Plan Note (Signed)
Hematuria has stopped since admission

## 2023-10-07 NOTE — Progress Notes (Signed)
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  Patient was found to have a hemoglobin of  6.5 in the [facility] emergency department status post 1 unit packed red blood cell transfusion.    His cardiac BNP was 547.0 and high-sensitivity troponin tests 856 and 1395 concerning for NSTEMI.   Cardiology was consulted. Patient was felt to be suffering from an NSTEMI but due to the patient's ongoing bleeding cardiology advised not to proceed with heparin or aspirin therapy.  The hospitalist group was called and patient was admitted to the hospitalist service.  Patient was then transferred to [facility] for further medical management after it was determined that patient was not going to undergo immediate invasive cardiac intervention.  Upon arrival to [facility] hemoglobin was noted to be 6.8 the evening of 10/15 prompting 1 additional unit of packed red blood cells to be transfused.  A third unit was additionally transfused on 10/17.  Troponins gradually decreased after blood transfusion with resolution of chest pain.  Patient remained off of heparin and aspirin.  NSTEMI was felt to be secondary to profound anemia and profound anemia was felt to be secondary to  several weeks of hematuria status post Foley catheter placement.    Cardiology has been following along and assisting with comanagement.  Dr. Mayford Knife with Cardiology recommended targeting a hemoglobin of greater than 8 for now.  Cardiology additionally recommends the patient's severe aortic stenosis be addressed prior to the patient proceeding with upcoming prostate surgery (which was originally scheduled for 10/25 and has since been postponed).  In the days that followed as the patient clinically improved plans have been made to attempt to proceed with cardiac catheterization followed by possible TAVR during this hospitalization.  Patient has now been transferred to [facility] in preparation for cardiac catheterization on 10/21.    Assessment and Plan: * NSTEMI (non-ST elevated myocardial infarction) (HCC) High-sensitivity troponin peaked at 4814 and is now downtrending with last reading of 3357 morning of 10/16. Patient remains chest pain-free. It is most likely that the patient's severe anemia due to ongoing hematuria in the setting of severe aortic stenosis prompted the patient's NSTEMI. Avoiding aspirin or heparin therapy due to substantial intermittent hematuria per the advisement of cardiology Cardiology following, their input is appreciated. Patient will likely need cardiac catheterization in preparation for aortic valve replacement during this hospitalization, possibly Monday at [facility].  Acute blood loss anemia Acute blood loss anemia due to substantial intermittent gross hematuria over the past several weeks after a Foley catheter was placed for BPH with obstruction. Patient received a total of 3 units thus far, 2 on 10/15 and one on 10/17. Venofer infusion administered on 10/18 for severe iron deficiency. IM B12 administered on 10/18 followed by daily oral vitamin B12 for low-normal B12 levels. No evidence currently of active hematuria.  No clinical evidence of other  sources of bleeding.   Avoiding anticoagulants. Transfusing to target hemoglobin of 8 considering concurrent severe aortic stenosis in preparation for eventual cardiac catheterization and TAVR, possibly Monday. Performing serial CBCs Avoiding aspirin or heparin at this time SCDs for DVT prophylaxis.  Severe aortic stenosis Known history of severe aortic stenosis with echocardiogram during this hospitalization revealing aortic valve area 0.89 cm Cardiology feels that the aortic stenosis will likely need to be addressed via TAVR before the patient proceeds with prostate surgery. Transfusing to target hemoglobin of 8 per my discussion with Dr. Mayford Knife in anticipation for possible cardiac catheterization on Monday at [facility] and eventual TAVR.  BPH with obstruction/lower urinary tract symptoms Patient has had an indwelling Foley catheter since early September due to urinary obstruction secondary to severe BPH Patient has communicated to his outpatient urologist on 10/18 his current condition and procedure will now be postponed. Cardiology recommending rectifying severe arctic stenosis via TAVR prior to proceeding with urologic surgery. Keeping current Foley catheter in place Continue Proscar Avoiding anticoagulants so as not to provoke bleeding.  Gross hematuria Hematuria has stopped since admission  Chronic diastolic CHF (congestive heart failure) (HCC) Improved rales and wheezing. Patient near euvolemia.   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  Dyslipidemia, goal LDL below 70 Continue statin therapy  Acute respiratory failure with hypoxia (HCC) Patient reports at least a 2-1/65-month history of progressively worsening dyspnea on exertion in the home setting due to severe AS. This is combined with substantial worsening as patient became profoundly anemic. Weaning O2 as hemoglobin  has improved  Nicotine dependence, cigarettes, uncomplicated Counseling daily on cessation.    Subjective:  Patient continues to deny chest pain.  Patient does complain of mild shortness of breath, worse with exertion and improved with rest.  Patient denies any associated cough or fever.  Patient reports good appetite.    Physical Exam:  Vitals:   10/07/23 0618 10/07/23 1252 10/07/23 1751 10/07/23 1913  BP:  (!) 124/59 (!) 151/67 127/63  Pulse:  72 75 66  Resp:  16 18 17   Temp:  97.9 F (36.6 C) (!) 97.4 F (36.3 C) 98.7 F (37.1 C)  TempSrc:  Oral Oral Oral  SpO2: 95% 95% 97% 96%  Weight:      Height:        Constitutional: Awake alert and oriented x3, no associated distress.   Skin: no rashes, no lesions, good skin turgor noted. Eyes: Pupils are equally reactive to light.  No evidence of scleral icterus or conjunctival pallor.  ENMT: Moist mucous membranes noted.  Posterior pharynx clear of any exudate or lesions.   Respiratory: Wheezing has essentially resolved.  Patient continues to exhibit mild bibasilar rales.  Normal respiratory effort. No accessory muscle use.  Cardiovascular: 3 out of 6 systolic murmur heard best over the aortic valve.,  Regular rate and rhythm.  No extremity edema. 2+ pedal pulses. No carotid bruits.  Abdomen: Abdomen is soft and nontender.  No evidence of intra-abdominal masses.  Positive bowel sounds noted in all quadrants.   Musculoskeletal: No joint deformity upper and lower extremities. Good ROM, no contractures. Normal muscle tone.     Data Reviewed:  I have personally reviewed and interpreted labs, imaging.  Significant findings are   CBC: Recent Labs  Lab 10/04/23 0509 10/04/23 0809 10/04/23 1905 10/05/23 0253 10/05/23 1929 10/06/23 0354  10/07/23 0403  WBC 8.6  --  11.9* 11.0*  --  10.3 8.7  HGB 7.4*   < > 7.7* 7.4* 9.1* 8.3* 8.9*  HCT 23.5*   < > 24.6* 23.4* 29.0* 27.1* 28.8*  MCV 90.7  --  91.8 91.4  --  92.5 92.0  PLT  187  --  211 180  --  170 172   < > = values in this interval not displayed.   Basic Metabolic Panel: Recent Labs  Lab 10/03/23 0412 10/04/23 0509 10/05/23 0253 10/06/23 0354 10/07/23 0403  NA 134* 139 134* 135 136  K 3.6 4.1 4.3 3.9 3.8  CL 103 104 99 103 103  CO2 24 26 26 25 27   GLUCOSE 125* 159* 122* 92 90  BUN 18 29* 40* 26* 20  CREATININE 0.81 0.79 0.94 0.65 0.66  CALCIUM 7.9* 8.3* 8.0* 7.8* 8.2*  MG  --  2.2  --  2.5* 2.3   GFR: Estimated Creatinine Clearance: 70.6 mL/min (by C-G formula based on SCr of 0.66 mg/dL). Liver Function Tests: Recent Labs  Lab 10/03/23 0412 10/04/23 0509 10/05/23 0253 10/06/23 0354 10/07/23 0403  AST 24 24 19 30 24   ALT 15 14 16 26  32  ALKPHOS 52 42 39 39 39  BILITOT 0.6 0.5 0.3 0.4 0.7  PROT 5.9* 5.5* 5.3* 5.3* 5.3*  ALBUMIN 3.0* 2.7* 2.8* 2.7* 2.6*    Coagulation Profile: Recent Labs  Lab 10/03/23 0412 10/04/23 0509 10/05/23 0253  INR 1.1 1.1 1.1     Telemetry: Personally reviewed.  Rhythm is normal sinus rhythm with heart rate of 80 bpm..  No dynamic ST segment changes appreciated.   Code Status:  Full code.  Code status decision has been confirmed with: patient Family Communication: Wife is at the bedside who has been updated on plan of care on 10/18.   Severity of Illness:  The appropriate patient status for this patient is INPATIENT. Inpatient status is judged to be reasonable and necessary in order to provide the required intensity of service to ensure the patient's safety. The patient's presenting symptoms, physical exam findings, and initial radiographic and laboratory data in the context of their chronic comorbidities is felt to place them at high risk for further clinical deterioration. Furthermore, it is not anticipated that the patient will be medically stable for discharge from the hospital within 2 midnights of admission.   * I certify that at the point of admission it is my clinical judgment that the patient  will require inpatient hospital care spanning beyond 2 midnights from the point of admission due to high intensity of service, high risk for further deterioration and high frequency of surveillance required.*  Time spent:  50 minutes  Author:  Marinda Elk MD  10/07/2023 7:15 PM

## 2023-10-07 NOTE — Plan of Care (Signed)
  Problem: Clinical Measurements: Goal: Diagnostic test results will improve Outcome: Progressing   Problem: Activity: Goal: Risk for activity intolerance will decrease Outcome: Progressing   Problem: Safety: Goal: Ability to remain free from injury will improve Outcome: Progressing   

## 2023-10-08 DIAGNOSIS — I5033 Acute on chronic diastolic (congestive) heart failure: Secondary | ICD-10-CM | POA: Diagnosis not present

## 2023-10-08 DIAGNOSIS — I10 Essential (primary) hypertension: Secondary | ICD-10-CM

## 2023-10-08 DIAGNOSIS — I214 Non-ST elevation (NSTEMI) myocardial infarction: Secondary | ICD-10-CM | POA: Diagnosis not present

## 2023-10-08 DIAGNOSIS — D62 Acute posthemorrhagic anemia: Secondary | ICD-10-CM | POA: Diagnosis not present

## 2023-10-08 LAB — COMPREHENSIVE METABOLIC PANEL
ALT: 27 U/L (ref 0–44)
AST: 17 U/L (ref 15–41)
Albumin: 2.3 g/dL — ABNORMAL LOW (ref 3.5–5.0)
Alkaline Phosphatase: 41 U/L (ref 38–126)
Anion gap: 5 (ref 5–15)
BUN: 17 mg/dL (ref 8–23)
CO2: 28 mmol/L (ref 22–32)
Calcium: 8.2 mg/dL — ABNORMAL LOW (ref 8.9–10.3)
Chloride: 102 mmol/L (ref 98–111)
Creatinine, Ser: 0.72 mg/dL (ref 0.61–1.24)
GFR, Estimated: >60 mL/min (ref >60)
Glucose, Bld: 99 mg/dL (ref 70–99)
Potassium: 4 mmol/L (ref 3.5–5.1)
Sodium: 135 mmol/L (ref 135–145)
Total Bilirubin: 0.4 mg/dL (ref 0.3–1.2)
Total Protein: 4.9 g/dL — ABNORMAL LOW (ref 6.5–8.1)

## 2023-10-08 LAB — MAGNESIUM: Magnesium: 2 mg/dL (ref 1.7–2.4)

## 2023-10-08 LAB — LACTATE DEHYDROGENASE: LDH: 133 U/L (ref 98–192)

## 2023-10-08 LAB — CBC
HCT: 27.3 % — ABNORMAL LOW (ref 39.0–52.0)
Hemoglobin: 8.8 g/dL — ABNORMAL LOW (ref 13.0–17.0)
MCH: 27.6 pg (ref 26.0–34.0)
MCHC: 32.2 g/dL (ref 30.0–36.0)
MCV: 85.6 fL (ref 80.0–100.0)
Platelets: 181 10*3/uL (ref 150–400)
RBC: 3.19 MIL/uL — ABNORMAL LOW (ref 4.22–5.81)
RDW: 14 % (ref 11.5–15.5)
WBC: 9 10*3/uL (ref 4.0–10.5)
nRBC: 0 % (ref 0.0–0.2)

## 2023-10-08 LAB — BILIRUBIN, DIRECT: Bilirubin, Direct: <0.1 mg/dL (ref 0.0–0.2)

## 2023-10-08 MED ORDER — SPIRONOLACTONE 12.5 MG HALF TABLET
12.5000 mg | ORAL_TABLET | Freq: Every day | ORAL | Status: DC
  Administered 2023-10-08: 12.5 mg via ORAL
  Filled 2023-10-08: qty 1

## 2023-10-08 MED ORDER — ASPIRIN 81 MG PO CHEW
81.0000 mg | CHEWABLE_TABLET | ORAL | Status: AC
  Administered 2023-10-09: 81 mg via ORAL
  Filled 2023-10-08: qty 1

## 2023-10-08 MED ORDER — SODIUM CHLORIDE 0.9% FLUSH
10.0000 mL | Freq: Two times a day (BID) | INTRAVENOUS | Status: DC
  Administered 2023-10-08: 10 mL via INTRAVENOUS

## 2023-10-08 NOTE — Progress Notes (Signed)
Progress Note  Patient Name: Kyle Matthews Date of Encounter: 10/08/2023 Primary Cardiologist: Maisie Fus, MD   Subjective   Overnight Hgb stable with no evidence of hemolysis. Patient notes no CP, SOB, Palpitations.  Vital Signs    Vitals:   10/08/23 0416 10/08/23 0758 10/08/23 0835 10/08/23 1103  BP: (!) 142/60  124/70 (!) 119/58  Pulse: 63  67 73  Resp: 17  18 18   Temp: 98.1 F (36.7 C)  98.7 F (37.1 C) 98.7 F (37.1 C)  TempSrc: Oral  Oral Oral  SpO2: 98% 99% 94% 95%  Weight: 68.6 kg     Height:        Intake/Output Summary (Last 24 hours) at 10/08/2023 1238 Last data filed at 10/08/2023 0940 Gross per 24 hour  Intake 360 ml  Output 2550 ml  Net -2190 ml   Filed Weights   10/06/23 0500 10/07/23 0500 10/08/23 0416  Weight: 71.5 kg 71.2 kg 68.6 kg    Physical Exam   GEN: No acute distress.   Neck: No JVD Cardiac: RRR,3/6 systolic murmur with absent A2, +2 radial pulses Respiratory: Decreased breath sounds bilaterally GI: Soft, nontender, non-distended  MS: No edema  Labs   Telemetry: SR   Chemistry Recent Labs  Lab 10/06/23 0354 10/07/23 0403 10/08/23 0231  NA 135 136 135  K 3.9 3.8 4.0  CL 103 103 102  CO2 25 27 28   GLUCOSE 92 90 99  BUN 26* 20 17  CREATININE 0.65 0.66 0.72  CALCIUM 7.8* 8.2* 8.2*  PROT 5.3* 5.3* 4.9*  ALBUMIN 2.7* 2.6* 2.3*  AST 30 24 17   ALT 26 32 27  ALKPHOS 39 39 41  BILITOT 0.4 0.7 0.4  GFRNONAA >60 >60 >60  ANIONGAP 7 6 5      Hematology Recent Labs  Lab 10/06/23 0354 10/07/23 0403 10/08/23 0231  WBC 10.3 8.7 9.0  RBC 2.93* 3.13* 3.19*  HGB 8.3* 8.9* 8.8*  HCT 27.1* 28.8* 27.3*  MCV 92.5 92.0 85.6  MCH 28.3 28.4 27.6  MCHC 30.6 30.9 32.2  RDW 15.1 14.6 14.0  PLT 170 172 181    Cardiac EnzymesNo results for input(s): "TROPONINI" in the last 168 hours. No results for input(s): "TROPIPOC" in the last 168 hours.   BNP Recent Labs  Lab 10/03/23 0412 10/04/23 0509  BNP 547.0* 501.3*      DDimer No results for input(s): "DDIMER" in the last 168 hours.   Cardiac Studies   Cardiac Studies & Procedures       ECHOCARDIOGRAM  ECHOCARDIOGRAM COMPLETE 10/03/2023  Narrative ECHOCARDIOGRAM REPORT    Patient Name:   Kyle Matthews Date of Exam: 10/03/2023 Medical Rec #:  [id number]  Height:       68.0 in Accession #:    [id number] Weight:       152.6 lb Date of Birth:  Mar 10, 1946   BSA:          1.822 m Patient Age:    77 years   BP:           111/68 mmHg Patient Gender: M          HR:           94 bpm. [facility]  Procedure: 2D Echo, Cardiac Doppler and Color Doppler  Indications:    SBE  History:        Patient has prior history of Echocardiogram examinations, most recent 05/02/2018. Acute MI, Arrythmias:RBBB, Signs/Symptoms:Chest Pain and Shortness  of Breath; Risk Factors:Hypertension, Dyslipidemia and Current Smoker.  Sonographer:    Mikki Harbor Referring Phys: [id number] SHERI HAMMOCK  IMPRESSIONS   1. Left ventricular ejection fraction, by estimation, is 55 to 60%. The left ventricle has normal function. The left ventricle has no regional wall motion abnormalities. There is mild left ventricular hypertrophy. Left ventricular diastolic parameters are indeterminate. Elevated left atrial pressure. 2. Right ventricular systolic function is normal. The right ventricular size is normal. There is mildly elevated pulmonary artery systolic pressure. 3. Left atrial size was moderately dilated. 4. Right atrial size was moderately dilated. 5. The mitral valve is abnormal. Mild to moderate mitral valve regurgitation. No evidence of mitral stenosis. 6. The tricuspid valve is abnormal. 7. The aortic valve was not well visualized. There is severe calcifcation of the aortic valve. There is severe thickening of the aortic valve. Aortic valve regurgitation is moderate to severe. Severe aortic valve stenosis. Severe aortic stenosis is present. Aortic valve mean  gradient measures 42.0 mmHg. Aortic valve peak gradient measures 69.2 mmHg. Aortic valve area, by VTI measures 0.89 cm. 8. The inferior vena cava is dilated in size with >50% respiratory variability, suggesting right atrial pressure of 8 mmHg.  FINDINGS Left Ventricle: Left ventricular ejection fraction, by estimation, is 55 to 60%. The left ventricle has normal function. The left ventricle has no regional wall motion abnormalities. The left ventricular internal cavity size was normal in size. There is mild left ventricular hypertrophy. Left ventricular diastolic parameters are indeterminate. Elevated left atrial pressure.  Right Ventricle: The right ventricular size is normal. Right vetricular wall thickness was not well visualized. Right ventricular systolic function is normal. There is mildly elevated pulmonary artery systolic pressure. The tricuspid regurgitant velocity is 3.02 m/s, and with an assumed right atrial pressure of 8 mmHg, the estimated right ventricular systolic pressure is 44.5 mmHg.  Left Atrium: Left atrial size was moderately dilated.  Right Atrium: Right atrial size was moderately dilated.  Pericardium: There is no evidence of pericardial effusion.  Mitral Valve: The mitral valve is abnormal. Mild mitral annular calcification. Mild to moderate mitral valve regurgitation. No evidence of mitral valve stenosis. MV peak gradient, 11.4 mmHg. The mean mitral valve gradient is 6.0 mmHg.  Tricuspid Valve: The tricuspid valve is abnormal. Tricuspid valve regurgitation is mild . No evidence of tricuspid stenosis.  Aortic Valve: The aortic valve was not well visualized. There is severe calcifcation of the aortic valve. There is severe thickening of the aortic valve. There is severe aortic valve annular calcification. Aortic valve regurgitation is moderate to severe. Aortic regurgitation PHT measures 227 msec. Severe aortic stenosis is present. Aortic valve mean gradient measures 42.0  mmHg. Aortic valve peak gradient measures 69.2 mmHg. Aortic valve area, by VTI measures 0.89 cm.  Pulmonic Valve: The pulmonic valve was not well visualized. Pulmonic valve regurgitation is not visualized. No evidence of pulmonic stenosis.  Aorta: The aortic root and ascending aorta are structurally normal, with no evidence of dilitation.  Venous: The inferior vena cava is dilated in size with greater than 50% respiratory variability, suggesting right atrial pressure of 8 mmHg.  IAS/Shunts: No atrial level shunt detected by color flow Doppler.   LEFT VENTRICLE PLAX 2D LVIDd:         4.80 cm      Diastology LVIDs:         3.20 cm      LV e' medial:    6.96 cm/s LV PW:  1.10 cm      LV E/e' medial:  18.8 LV IVS:        1.10 cm      LV e' lateral:   9.25 cm/s LVOT diam:     1.90 cm      LV E/e' lateral: 14.2 LV SV:         91 LV SV Index:   50 LVOT Area:     2.84 cm  LV Volumes (MOD) LV vol d, MOD A2C: 111.0 ml LV vol d, MOD A4C: 141.0 ml LV vol s, MOD A2C: 58.4 ml LV vol s, MOD A4C: 43.7 ml LV SV MOD A2C:     52.6 ml LV SV MOD A4C:     141.0 ml LV SV MOD BP:      76.4 ml  RIGHT VENTRICLE RV Basal diam:  3.80 cm RV Mid diam:    3.20 cm RV S prime:     15.30 cm/s TAPSE (M-mode): 2.2 cm  LEFT ATRIUM             Index        RIGHT ATRIUM           Index LA diam:        3.80 cm 2.09 cm/m   RA Area:     21.80 cm LA Vol (A2C):   93.7 ml 51.44 ml/m  RA Volume:   76.90 ml  42.22 ml/m LA Vol (A4C):   75.9 ml 41.67 ml/m LA Biplane Vol: 84.6 ml 46.44 ml/m AORTIC VALVE                     PULMONIC VALVE AV Area (Vmax):    0.93 cm      PV Vmax:       0.88 m/s AV Area (Vmean):   0.95 cm      PV Peak grad:  3.1 mmHg AV Area (VTI):     0.89 cm AV Vmax:           416.00 cm/s AV Vmean:          286.667 cm/s AV VTI:            1.020 m AV Peak Grad:      69.2 mmHg AV Mean Grad:      42.0 mmHg LVOT Vmax:         137.00 cm/s LVOT Vmean:        95.600 cm/s LVOT VTI:           0.320 m LVOT/AV VTI ratio: 0.31 AI PHT:            227 msec  AORTA Ao Root diam: 3.30 cm Ao Asc diam:  3.40 cm  MITRAL VALVE                TRICUSPID VALVE MV Area (PHT): 3.63 cm     TR Peak grad:   36.5 mmHg MV Peak grad:  11.4 mmHg    TR Vmax:        302.00 cm/s MV Mean grad:  6.0 mmHg MV Vmax:       1.68 m/s     SHUNTS MV Vmean:      111.0 cm/s   Systemic VTI:  0.32 m MV Decel Time: 209 msec     Systemic Diam: 1.90 cm MV E velocity: 131.00 cm/s MV A velocity: 130.00 cm/s MV E/A ratio:  1.01  Dina Rich MD Electronically signed by Dina Rich MD  Signature Date/Time: 10/03/2023/12:33:39 PM    Final    MONITORS  LONG TERM MONITOR (3-14 DAYS) 08/26/2020            Assessment & Plan   NSTEMI - No recurrent chest pain after blood transfusions. - Continue beta-blocker and statin. - Symptoms and troponin elevation felt to be more consistent with demand ischemia from severe anemia and acute hypoxic respiratory failure with underlying severe aortic stenosis rather than true ACS.  -He is also in need of a prostate surgery.  -Hgb stable  with no need for transfusion -continue Crestor 40mg  daily and Carvedilol 3.125mg  BID - a discussion of timing of his aortic stenosis and prostate surgery is warranted, to do this we should help facilitate all of his cardiac work up  Risks and benefits of cardiac catheterization have been discussed with the patient.  These include bleeding, infection, kidney damage, stroke, heart attack, death.  The patient understands these risks and is willing to proceed.  Access recommendations: R radial Procedural considerations Diagnostic cath   Severe Aortic Stenosis  - with mean gradient of .  -cath plan as above, anemia appears related to hematuria which has improved -given the need to plan timing of his prostate surgery for AKI/swelling hematuria (originally 10/13/23), expediting his work up for AS evaluation is important for  multi-disciplinary discussion of timing of his interventions  Hypertension  - continue Carvedilol 3.125mg  BID.  - planned to home Amlodipine-Benazepril prior to cath   Hyperlipidemia - Continue Crestor 40mg  daily, LDL at go   Symptomatic Anemia Hematuria - Hemoglobin 6.5 on admission secondary to gross hematuria since having Foley placed in 07/2023. Patient states hematuria did stop after he stopped his Aspirin 5 days prior to admission.  - has received a total of 3 units PRBS  - Recommend keeping hemoglobin >8 given severe aortic stenosis. - planned for cath on Monday by Dr. Mayford Knife - Management per primary team.   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      For questions or updates, please contact [facility] Please consult [email or web address] for contact info under Cardiology/STEMI.      Riley Lam, MD FASE [facility] Cardiologist [facility]  [address] [place], [address] [place], [place] [address] [phone]  12:38 PM

## 2023-10-08 NOTE — Progress Notes (Signed)
Progress Note   Patient: Kyle Matthews ZOX:[id number] DOB: 09-29-1946 DOA: 10/03/2023     5 DOS: the patient was seen and examined on 10/08/2023   Brief hospital course: 78 year old male past medical history of  moderate to severe AS and diastolic congestive heart failure, hypertension, hyperlipidemia, tobacco use, BPH with chronic bladder outlet obstruction and chronic indwelling Foley catheter, prior CVA 2007 presented to the [facility] emergency department complaining of chest pain 7/10 and shortness of breath that has been intermittently problematic for the last 3 weeks with concurrent gross hematuria.    Patient was found to have a hemoglobin of  6.5 in the [facility] emergency department status post 1 unit packed red blood cell transfusion.    His cardiac BNP was 547.0 and high-sensitivity troponin tests 856 and 1395 concerning for NSTEMI.     Cardiology was consulted. Patient was felt to be suffering from an NSTEMI but due to the patient's ongoing bleeding cardiology advised not to proceed with heparin or aspirin therapy.    Patient was then transferred to [facility] for further medical management after it was determined that patient was not going to undergo immediate invasive cardiac intervention.  Upon arrival to [facility] hemoglobin was noted to be 6.8 the evening of 10/15 prompting 1 additional unit of packed red blood cells to be transfused.  A third unit was additionally transfused on 10/17.  Troponins gradually decreased after blood transfusion with resolution of chest pain.    NSTEMI was felt to be secondary to profound anemia and profound anemia was felt to be secondary to several weeks of hematuria status post Foley catheter placement.    Cardiology additionally recommends the patient's severe aortic stenosis be addressed prior to the patient proceeding with upcoming prostate surgery (which was originally scheduled for 10/25 and has since been  postponed).  10/20 transfer to [facility] for further work up with cardiac catheterization.   Assessment and Plan: * Acute blood loss anemia Hematuria.  BPH chronic Foley catheter.   Iron deficiency anemia.   SP 3 units PRBC transfusion. IV iron infusion.   Cardiology recommending rectifying severe arctic stenosis via TAVR prior to proceeding with urologic surgery. Keeping current Foley catheter in place Continue Proscar  NSTEMI (non-ST elevated myocardial infarction) (HCC) MI type 2 due to anemia, demand ischemia.  Plan to continue medical therapy, keep hgb 8 or above.  Holding antiplatelet therapy. Continue statin.   Acute on chronic diastolic CHF (congestive heart failure) (HCC) Echocardiogram with preserved LV systolic function EF 55 to 60%, mild LVH, RV systolic function preserved, RVSP 44.5 mmHg.  LA and RA with moderate dilatation, mild to moderate mitral valve regurgitation, moderate to severe aortic regurgitation and severe aortic valve stenosis,   Currently patient is euvolemic.  Patient is 4,458 ml negative fluid balance since admission.   Continue blood pressure control with carvedilol.  Add spironolactone, hold on SGLT 2 inh due to risk of urine infection.  As needed furosemide.  Further work up with cardiac catheterization tomorrow.   Acute hypoxemic respiratory failure due to acute cardiogenic pulmonary edema.  02 saturation today is 98% on 2 L/min per [place].  Patient had short course of antibiotic therapy, pneumonia has been ruled out.   Essential hypertension Continue blood pressure control with carvedilol.   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  Dyslipidemia, goal LDL below 70 Continue statin therapy  Nicotine dependence, cigarettes, uncomplicated Counseling daily on cessation.        Subjective: Patient with no chest pain or dyspnea, lower extremity  edema has improved. No hematuria   Physical Exam: Vitals:   10/08/23 0416 10/08/23 0758 10/08/23 0835 10/08/23 1103  BP: (!) 142/60  124/70 (!) 119/58  Pulse: 63  67 73  Resp: 17  18 18   Temp: 98.1 F (36.7 C)  98.7 F (37.1 C) 98.7 F (37.1 C)  TempSrc: Oral  Oral Oral  SpO2: 98% 99% 94% 95%  Weight: 68.6 kg     Height:       Neurology awake and alert ENT with mild pallor Cardiovascular with S1 and S2 present and regular, positive systolic murmur at the base with no gallops No JVD No lower extremity edema Respiratory with no rales or wheezing, no rhonchi Abdomen with no distention  Data Reviewed:    Family Communication: no family at the bedside   Disposition: Status is: Inpatient Remains inpatient appropriate because: cardiac catheterization   Planned Discharge Destination: Home      Author: Coralie Keens, MD 10/08/2023 12:08 PM  For on call review [email or web address].

## 2023-10-08 NOTE — Plan of Care (Signed)

## 2023-10-08 NOTE — H&P (View-Only) (Signed)
Progress Note  Patient Name: Kyle Matthews Date of Encounter: 10/08/2023 Primary Cardiologist: Maisie Fus, MD   Subjective   Overnight Hgb stable with no evidence of hemolysis. Patient notes no CP, SOB, Palpitations.  Vital Signs    Vitals:   10/08/23 0416 10/08/23 0758 10/08/23 0835 10/08/23 1103  BP: (!) 142/60  124/70 (!) 119/58  Pulse: 63  67 73  Resp: 17  18 18   Temp: 98.1 F (36.7 C)  98.7 F (37.1 C) 98.7 F (37.1 C)  TempSrc: Oral  Oral Oral  SpO2: 98% 99% 94% 95%  Weight: 68.6 kg     Height:        Intake/Output Summary (Last 24 hours) at 10/08/2023 1238 Last data filed at 10/08/2023 0940 Gross per 24 hour  Intake 360 ml  Output 2550 ml  Net -2190 ml   Filed Weights   10/06/23 0500 10/07/23 0500 10/08/23 0416  Weight: 71.5 kg 71.2 kg 68.6 kg    Physical Exam   GEN: No acute distress.   Neck: No JVD Cardiac: RRR,3/6 systolic murmur with absent A2, +2 radial pulses Respiratory: Decreased breath sounds bilaterally GI: Soft, nontender, non-distended  MS: No edema  Labs   Telemetry: SR   Chemistry Recent Labs  Lab 10/06/23 0354 10/07/23 0403 10/08/23 0231  NA 135 136 135  K 3.9 3.8 4.0  CL 103 103 102  CO2 25 27 28   GLUCOSE 92 90 99  BUN 26* 20 17  CREATININE 0.65 0.66 0.72  CALCIUM 7.8* 8.2* 8.2*  PROT 5.3* 5.3* 4.9*  ALBUMIN 2.7* 2.6* 2.3*  AST 30 24 17   ALT 26 32 27  ALKPHOS 39 39 41  BILITOT 0.4 0.7 0.4  GFRNONAA >60 >60 >60  ANIONGAP 7 6 5      Hematology Recent Labs  Lab 10/06/23 0354 10/07/23 0403 10/08/23 0231  WBC 10.3 8.7 9.0  RBC 2.93* 3.13* 3.19*  HGB 8.3* 8.9* 8.8*  HCT 27.1* 28.8* 27.3*  MCV 92.5 92.0 85.6  MCH 28.3 28.4 27.6  MCHC 30.6 30.9 32.2  RDW 15.1 14.6 14.0  PLT 170 172 181    Cardiac EnzymesNo results for input(s): "TROPONINI" in the last 168 hours. No results for input(s): "TROPIPOC" in the last 168 hours.   BNP Recent Labs  Lab 10/03/23 0412 10/04/23 0509  BNP 547.0* 501.3*      DDimer No results for input(s): "DDIMER" in the last 168 hours.   Cardiac Studies   Cardiac Studies & Procedures       ECHOCARDIOGRAM  ECHOCARDIOGRAM COMPLETE 10/03/2023  Narrative ECHOCARDIOGRAM REPORT    Patient Name:   Kyle Matthews Date of Exam: 10/03/2023 Medical Rec #:  [id number]  Height:       68.0 in Accession #:    [id number] Weight:       152.6 lb Date of Birth:  Mar 10, 1946   BSA:          1.822 m Patient Age:    77 years   BP:           111/68 mmHg Patient Gender: M          HR:           94 bpm. [facility]  Procedure: 2D Echo, Cardiac Doppler and Color Doppler  Indications:    SBE  History:        Patient has prior history of Echocardiogram examinations, most recent 05/02/2018. Acute MI, Arrythmias:RBBB, Signs/Symptoms:Chest Pain and Shortness  of Breath; Risk Factors:Hypertension, Dyslipidemia and Current Smoker.  Sonographer:    Mikki Harbor Referring Phys: [id number] SHERI HAMMOCK  IMPRESSIONS   1. Left ventricular ejection fraction, by estimation, is 55 to 60%. The left ventricle has normal function. The left ventricle has no regional wall motion abnormalities. There is mild left ventricular hypertrophy. Left ventricular diastolic parameters are indeterminate. Elevated left atrial pressure. 2. Right ventricular systolic function is normal. The right ventricular size is normal. There is mildly elevated pulmonary artery systolic pressure. 3. Left atrial size was moderately dilated. 4. Right atrial size was moderately dilated. 5. The mitral valve is abnormal. Mild to moderate mitral valve regurgitation. No evidence of mitral stenosis. 6. The tricuspid valve is abnormal. 7. The aortic valve was not well visualized. There is severe calcifcation of the aortic valve. There is severe thickening of the aortic valve. Aortic valve regurgitation is moderate to severe. Severe aortic valve stenosis. Severe aortic stenosis is present. Aortic valve mean  gradient measures 42.0 mmHg. Aortic valve peak gradient measures 69.2 mmHg. Aortic valve area, by VTI measures 0.89 cm. 8. The inferior vena cava is dilated in size with >50% respiratory variability, suggesting right atrial pressure of 8 mmHg.  FINDINGS Left Ventricle: Left ventricular ejection fraction, by estimation, is 55 to 60%. The left ventricle has normal function. The left ventricle has no regional wall motion abnormalities. The left ventricular internal cavity size was normal in size. There is mild left ventricular hypertrophy. Left ventricular diastolic parameters are indeterminate. Elevated left atrial pressure.  Right Ventricle: The right ventricular size is normal. Right vetricular wall thickness was not well visualized. Right ventricular systolic function is normal. There is mildly elevated pulmonary artery systolic pressure. The tricuspid regurgitant velocity is 3.02 m/s, and with an assumed right atrial pressure of 8 mmHg, the estimated right ventricular systolic pressure is 44.5 mmHg.  Left Atrium: Left atrial size was moderately dilated.  Right Atrium: Right atrial size was moderately dilated.  Pericardium: There is no evidence of pericardial effusion.  Mitral Valve: The mitral valve is abnormal. Mild mitral annular calcification. Mild to moderate mitral valve regurgitation. No evidence of mitral valve stenosis. MV peak gradient, 11.4 mmHg. The mean mitral valve gradient is 6.0 mmHg.  Tricuspid Valve: The tricuspid valve is abnormal. Tricuspid valve regurgitation is mild . No evidence of tricuspid stenosis.  Aortic Valve: The aortic valve was not well visualized. There is severe calcifcation of the aortic valve. There is severe thickening of the aortic valve. There is severe aortic valve annular calcification. Aortic valve regurgitation is moderate to severe. Aortic regurgitation PHT measures 227 msec. Severe aortic stenosis is present. Aortic valve mean gradient measures 42.0  mmHg. Aortic valve peak gradient measures 69.2 mmHg. Aortic valve area, by VTI measures 0.89 cm.  Pulmonic Valve: The pulmonic valve was not well visualized. Pulmonic valve regurgitation is not visualized. No evidence of pulmonic stenosis.  Aorta: The aortic root and ascending aorta are structurally normal, with no evidence of dilitation.  Venous: The inferior vena cava is dilated in size with greater than 50% respiratory variability, suggesting right atrial pressure of 8 mmHg.  IAS/Shunts: No atrial level shunt detected by color flow Doppler.   LEFT VENTRICLE PLAX 2D LVIDd:         4.80 cm      Diastology LVIDs:         3.20 cm      LV e' medial:    6.96 cm/s LV PW:  1.10 cm      LV E/e' medial:  18.8 LV IVS:        1.10 cm      LV e' lateral:   9.25 cm/s LVOT diam:     1.90 cm      LV E/e' lateral: 14.2 LV SV:         91 LV SV Index:   50 LVOT Area:     2.84 cm  LV Volumes (MOD) LV vol d, MOD A2C: 111.0 ml LV vol d, MOD A4C: 141.0 ml LV vol s, MOD A2C: 58.4 ml LV vol s, MOD A4C: 43.7 ml LV SV MOD A2C:     52.6 ml LV SV MOD A4C:     141.0 ml LV SV MOD BP:      76.4 ml  RIGHT VENTRICLE RV Basal diam:  3.80 cm RV Mid diam:    3.20 cm RV S prime:     15.30 cm/s TAPSE (M-mode): 2.2 cm  LEFT ATRIUM             Index        RIGHT ATRIUM           Index LA diam:        3.80 cm 2.09 cm/m   RA Area:     21.80 cm LA Vol (A2C):   93.7 ml 51.44 ml/m  RA Volume:   76.90 ml  42.22 ml/m LA Vol (A4C):   75.9 ml 41.67 ml/m LA Biplane Vol: 84.6 ml 46.44 ml/m AORTIC VALVE                     PULMONIC VALVE AV Area (Vmax):    0.93 cm      PV Vmax:       0.88 m/s AV Area (Vmean):   0.95 cm      PV Peak grad:  3.1 mmHg AV Area (VTI):     0.89 cm AV Vmax:           416.00 cm/s AV Vmean:          286.667 cm/s AV VTI:            1.020 m AV Peak Grad:      69.2 mmHg AV Mean Grad:      42.0 mmHg LVOT Vmax:         137.00 cm/s LVOT Vmean:        95.600 cm/s LVOT VTI:           0.320 m LVOT/AV VTI ratio: 0.31 AI PHT:            227 msec  AORTA Ao Root diam: 3.30 cm Ao Asc diam:  3.40 cm  MITRAL VALVE                TRICUSPID VALVE MV Area (PHT): 3.63 cm     TR Peak grad:   36.5 mmHg MV Peak grad:  11.4 mmHg    TR Vmax:        302.00 cm/s MV Mean grad:  6.0 mmHg MV Vmax:       1.68 m/s     SHUNTS MV Vmean:      111.0 cm/s   Systemic VTI:  0.32 m MV Decel Time: 209 msec     Systemic Diam: 1.90 cm MV E velocity: 131.00 cm/s MV A velocity: 130.00 cm/s MV E/A ratio:  1.01  Dina Rich MD Electronically signed by Dina Rich MD  Signature Date/Time: 10/03/2023/12:33:39 PM    Final    MONITORS  LONG TERM MONITOR (3-14 DAYS) 08/26/2020            Assessment & Plan   NSTEMI - No recurrent chest pain after blood transfusions. - Continue beta-blocker and statin. - Symptoms and troponin elevation felt to be more consistent with demand ischemia from severe anemia and acute hypoxic respiratory failure with underlying severe aortic stenosis rather than true ACS.  -He is also in need of a prostate surgery.  -Hgb stable  with no need for transfusion -continue Crestor 40mg  daily and Carvedilol 3.125mg  BID - a discussion of timing of his aortic stenosis and prostate surgery is warranted, to do this we should help facilitate all of his cardiac work up  Risks and benefits of cardiac catheterization have been discussed with the patient.  These include bleeding, infection, kidney damage, stroke, heart attack, death.  The patient understands these risks and is willing to proceed.  Access recommendations: R radial Procedural considerations Diagnostic cath   Severe Aortic Stenosis  - with mean gradient of .  -cath plan as above, anemia appears related to hematuria which has improved -given the need to plan timing of his prostate surgery for AKI/swelling hematuria (originally 10/13/23), expediting his work up for AS evaluation is important for  multi-disciplinary discussion of timing of his interventions  Hypertension  - continue Carvedilol 3.125mg  BID.  - planned to home Amlodipine-Benazepril prior to cath   Hyperlipidemia - Continue Crestor 40mg  daily, LDL at go   Symptomatic Anemia Hematuria - Hemoglobin 6.5 on admission secondary to gross hematuria since having Foley placed in 07/2023. Patient states hematuria did stop after he stopped his Aspirin 5 days prior to admission.  - has received a total of 3 units PRBS  - Recommend keeping hemoglobin >8 given severe aortic stenosis. - planned for cath on Monday by Dr. Mayford Knife - Management per primary team.   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      For questions or updates, please contact [facility] Please consult [email or web address] for contact info under Cardiology/STEMI.      Riley Lam, MD FASE [facility] Cardiologist [facility]  [address] [place], [address] [place], [place] [address] [phone]  12:38 PM

## 2023-10-08 NOTE — Progress Notes (Signed)
Mobility Specialist Progress Note:   10/08/23 1034  Mobility  Activity Ambulated with assistance in hallway  Level of Assistance Independent after set-up  Assistive Device None  Distance Ambulated (ft) 470 ft  Activity Response Tolerated well  Mobility Referral Yes  $Mobility charge 1 Mobility  Mobility Specialist Start Time (ACUTE ONLY) 0910  Mobility Specialist Stop Time (ACUTE ONLY) 0920  Mobility Specialist Time Calculation (min) (ACUTE ONLY) 10 min   Pre Mobility: 97 HR , 95% SpO2 RA 1 L  During Mobility:  90% - 94% SpO2 RA Post Mobility: 87 HR , 95% SpO2 1 L  Pt received in bed, agreeable to mobility. SB to stand and ambulate in hallway. Pt denied any discomfort during ambulation, asymptomatic throughout. Pt returned to bed with call bell in reach and all needs met.  Leory Plowman  Mobility Specialist Please contact via [employer] office at [phone]

## 2023-10-08 NOTE — Assessment & Plan Note (Addendum)
Hematuria.  BPH chronic Foley catheter.   Iron deficiency anemia.  SP 3 units PRBC transfusion. IV iron infusion.   Cardiology recommending rectifying severe arctic stenosis via TAVR prior to proceeding with urologic surgery. Keeping current Foley catheter in place Continue Proscar Hgb has been stable, with no further macroscopic hematuria.

## 2023-10-08 NOTE — Assessment & Plan Note (Signed)
MI type 2 due to anemia, demand ischemia. (NSTEMI)  Plan to continue medical therapy, keep hgb 8 or above.  Holding antiplatelet therapy. Continue statin.

## 2023-10-08 NOTE — Assessment & Plan Note (Signed)
Continue blood pressure control with carvedilol.  

## 2023-10-09 ENCOUNTER — Encounter (HOSPITAL_COMMUNITY)
Admission: EM | Disposition: A | Discharge status: Home Health | Payer: Self-pay | Source: Home / Self Care | Attending: Internal Medicine

## 2023-10-09 DIAGNOSIS — I2510 Atherosclerotic heart disease of native coronary artery without angina pectoris: Secondary | ICD-10-CM | POA: Diagnosis not present

## 2023-10-09 DIAGNOSIS — I350 Nonrheumatic aortic (valve) stenosis: Secondary | ICD-10-CM | POA: Diagnosis not present

## 2023-10-09 DIAGNOSIS — I214 Non-ST elevation (NSTEMI) myocardial infarction: Secondary | ICD-10-CM | POA: Diagnosis not present

## 2023-10-09 DIAGNOSIS — I5033 Acute on chronic diastolic (congestive) heart failure: Secondary | ICD-10-CM | POA: Diagnosis not present

## 2023-10-09 DIAGNOSIS — D62 Acute posthemorrhagic anemia: Secondary | ICD-10-CM | POA: Diagnosis not present

## 2023-10-09 DIAGNOSIS — I10 Essential (primary) hypertension: Secondary | ICD-10-CM | POA: Diagnosis not present

## 2023-10-09 HISTORY — PX: RIGHT/LEFT HEART CATH AND CORONARY ANGIOGRAPHY: CATH118266

## 2023-10-09 HISTORY — PX: CORONARY PRESSURE/FFR STUDY: CATH118243

## 2023-10-09 LAB — CBC
HCT: 33.1 % — ABNORMAL LOW (ref 39.0–52.0)
Hemoglobin: 10.6 g/dL — ABNORMAL LOW (ref 13.0–17.0)
MCH: 28 pg (ref 26.0–34.0)
MCHC: 32 g/dL (ref 30.0–36.0)
MCV: 87.6 fL (ref 80.0–100.0)
Platelets: 223 10*3/uL (ref 150–400)
RBC: 3.78 MIL/uL — ABNORMAL LOW (ref 4.22–5.81)
RDW: 14.2 % (ref 11.5–15.5)
WBC: 11.6 10*3/uL — ABNORMAL HIGH (ref 4.0–10.5)
nRBC: 0 % (ref 0.0–0.2)

## 2023-10-09 LAB — POCT I-STAT 7, (LYTES, BLD GAS, ICA,H+H)
Acid-Base Excess: 1 mmol/L (ref 0.0–2.0)
Bicarbonate: 25.2 mmol/L (ref 20.0–28.0)
Calcium, Ion: 1.18 mmol/L (ref 1.15–1.40)
HCT: 29 % — ABNORMAL LOW (ref 39.0–52.0)
Hemoglobin: 9.9 g/dL — ABNORMAL LOW (ref 13.0–17.0)
O2 Saturation: 92 %
Potassium: 3.5 mmol/L (ref 3.5–5.1)
Sodium: 138 mmol/L (ref 135–145)
TCO2: 26 mmol/L (ref 22–32)
pCO2 arterial: 38.3 mm[Hg] (ref 32–48)
pH, Arterial: 7.426 (ref 7.35–7.45)
pO2, Arterial: 63 mm[Hg] — ABNORMAL LOW (ref 83–108)

## 2023-10-09 LAB — BASIC METABOLIC PANEL
Anion gap: 7 (ref 5–15)
BUN: 16 mg/dL (ref 8–23)
CO2: 28 mmol/L (ref 22–32)
Calcium: 8.8 mg/dL — ABNORMAL LOW (ref 8.9–10.3)
Chloride: 102 mmol/L (ref 98–111)
Creatinine, Ser: 0.76 mg/dL (ref 0.61–1.24)
GFR, Estimated: >60 mL/min (ref >60)
Glucose, Bld: 100 mg/dL — ABNORMAL HIGH (ref 70–99)
Potassium: 4.3 mmol/L (ref 3.5–5.1)
Sodium: 137 mmol/L (ref 135–145)

## 2023-10-09 LAB — URINALYSIS, ROUTINE W REFLEX MICROSCOPIC
Bilirubin Urine: NEGATIVE
Glucose, UA: NEGATIVE mg/dL
Ketones, ur: NEGATIVE mg/dL
Nitrite: NEGATIVE
Protein, ur: NEGATIVE mg/dL
Specific Gravity, Urine: >1.046 — ABNORMAL HIGH (ref 1.005–1.030)
pH: 6 (ref 5.0–8.0)

## 2023-10-09 LAB — POCT I-STAT EG7
Acid-Base Excess: 1 mmol/L (ref 0.0–2.0)
Acid-Base Excess: 2 mmol/L (ref 0.0–2.0)
Bicarbonate: 26.5 mmol/L (ref 20.0–28.0)
Bicarbonate: 26.8 mmol/L (ref 20.0–28.0)
Calcium, Ion: 1.21 mmol/L (ref 1.15–1.40)
Calcium, Ion: 1.21 mmol/L (ref 1.15–1.40)
HCT: 29 % — ABNORMAL LOW (ref 39.0–52.0)
HCT: 30 % — ABNORMAL LOW (ref 39.0–52.0)
Hemoglobin: 10.2 g/dL — ABNORMAL LOW (ref 13.0–17.0)
Hemoglobin: 9.9 g/dL — ABNORMAL LOW (ref 13.0–17.0)
O2 Saturation: 63 %
O2 Saturation: 73 %
Potassium: 3.5 mmol/L (ref 3.5–5.1)
Potassium: 3.6 mmol/L (ref 3.5–5.1)
Sodium: 136 mmol/L (ref 135–145)
Sodium: 137 mmol/L (ref 135–145)
TCO2: 28 mmol/L (ref 22–32)
TCO2: 28 mmol/L (ref 22–32)
pCO2, Ven: 43 mm[Hg] — ABNORMAL LOW (ref 44–60)
pCO2, Ven: 43.1 mm[Hg] — ABNORMAL LOW (ref 44–60)
pH, Ven: 7.396 (ref 7.25–7.43)
pH, Ven: 7.402 (ref 7.25–7.43)
pO2, Ven: 33 mm[Hg] (ref 32–45)
pO2, Ven: 39 mm[Hg] (ref 32–45)

## 2023-10-09 LAB — IRON AND TIBC
Iron: 33 ug/dL — ABNORMAL LOW (ref 45–182)
Saturation Ratios: 8 % — ABNORMAL LOW (ref 17.9–39.5)
TIBC: 419 ug/dL (ref 250–450)
UIBC: 386 ug/dL

## 2023-10-09 LAB — FERRITIN: Ferritin: 101 ng/mL (ref 24–336)

## 2023-10-09 SURGERY — RIGHT/LEFT HEART CATH AND CORONARY ANGIOGRAPHY
Anesthesia: LOCAL

## 2023-10-09 MED ORDER — HEPARIN (PORCINE) IN NACL 2000-0.9 UNIT/L-% IV SOLN
INTRAVENOUS | Status: DC | PRN
  Administered 2023-10-09: 1000 mL

## 2023-10-09 MED ORDER — SODIUM CHLORIDE 0.9 % IV SOLN: INTRAVENOUS | Status: AC

## 2023-10-09 MED ORDER — ACETAMINOPHEN 325 MG PO TABS
650.0000 mg | ORAL_TABLET | ORAL | Status: DC | PRN
Start: 2023-10-09 — End: 2023-10-12

## 2023-10-09 MED ORDER — SODIUM CHLORIDE 0.9% FLUSH: 3.0000 mL | INTRAVENOUS | Status: DC | PRN

## 2023-10-09 MED ORDER — VERAPAMIL HCL 2.5 MG/ML IV SOLN
INTRAVENOUS | Status: AC
  Filled 2023-10-09: qty 2

## 2023-10-09 MED ORDER — HEPARIN SODIUM (PORCINE) 1000 UNIT/ML IJ SOLN
INTRAMUSCULAR | Status: AC
  Filled 2023-10-09: qty 10

## 2023-10-09 MED ORDER — MIDAZOLAM HCL 2 MG/2ML IJ SOLN
INTRAMUSCULAR | Status: DC | PRN
  Administered 2023-10-09: 1 mg via INTRAVENOUS

## 2023-10-09 MED ORDER — VERAPAMIL HCL 2.5 MG/ML IV SOLN
INTRAVENOUS | Status: DC | PRN
  Administered 2023-10-09: 10 mL via INTRA_ARTERIAL

## 2023-10-09 MED ORDER — LIDOCAINE HCL (PF) 1 % IJ SOLN
INTRAMUSCULAR | Status: AC
  Filled 2023-10-09: qty 30

## 2023-10-09 MED ORDER — LIDOCAINE HCL (PF) 1 % IJ SOLN
INTRAMUSCULAR | Status: DC | PRN
  Administered 2023-10-09 (×2): 2 mL

## 2023-10-09 MED ORDER — SODIUM CHLORIDE 0.9 % IV SOLN: 250.0000 mL | INTRAVENOUS | Status: DC | PRN

## 2023-10-09 MED ORDER — SODIUM CHLORIDE 0.9% FLUSH
3.0000 mL | Freq: Two times a day (BID) | INTRAVENOUS | Status: DC
  Administered 2023-10-09 – 2023-10-12 (×7): 3 mL via INTRAVENOUS

## 2023-10-09 MED ORDER — IOHEXOL 350 MG/ML SOLN
INTRAVENOUS | Status: DC | PRN
  Administered 2023-10-09: 140 mL

## 2023-10-09 MED ORDER — MIDAZOLAM HCL 2 MG/2ML IJ SOLN
INTRAMUSCULAR | Status: AC
  Filled 2023-10-09: qty 2

## 2023-10-09 MED ORDER — LABETALOL HCL 5 MG/ML IV SOLN: 10.0000 mg | INTRAVENOUS | Status: AC | PRN

## 2023-10-09 MED ORDER — FENTANYL CITRATE (PF) 100 MCG/2ML IJ SOLN
INTRAMUSCULAR | Status: DC | PRN
  Administered 2023-10-09: 25 ug via INTRAVENOUS

## 2023-10-09 MED ORDER — FENTANYL CITRATE (PF) 100 MCG/2ML IJ SOLN
INTRAMUSCULAR | Status: AC
  Filled 2023-10-09: qty 2

## 2023-10-09 MED ORDER — HYDRALAZINE HCL 20 MG/ML IJ SOLN: 10.0000 mg | INTRAMUSCULAR | Status: AC | PRN

## 2023-10-09 MED ORDER — IPRATROPIUM-ALBUTEROL 0.5-2.5 (3) MG/3ML IN SOLN
3.0000 mL | Freq: Two times a day (BID) | RESPIRATORY_TRACT | Status: DC
  Administered 2023-10-09 – 2023-10-10 (×3): 3 mL via RESPIRATORY_TRACT
  Filled 2023-10-09 (×3): qty 3

## 2023-10-09 MED ORDER — AMLODIPINE BESYLATE 5 MG PO TABS
5.0000 mg | ORAL_TABLET | Freq: Every day | ORAL | Status: DC
  Administered 2023-10-09 – 2023-10-12 (×4): 5 mg via ORAL
  Filled 2023-10-09 (×4): qty 1

## 2023-10-09 MED ORDER — HEPARIN SODIUM (PORCINE) 1000 UNIT/ML IJ SOLN
INTRAMUSCULAR | Status: DC | PRN
  Administered 2023-10-09: 5000 [IU] via INTRAVENOUS

## 2023-10-09 SURGICAL SUPPLY — 16 items
CATH BALLN WEDGE 5F 110CM (CATHETERS) IMPLANT
CATH DIAG 6FR JR4 (CATHETERS) IMPLANT
CATH DIAG 6FR PIGTAIL ANGLED (CATHETERS) IMPLANT
CATH INFINITI 6F FL3.5 (CATHETERS) IMPLANT
CATH INFINITI AMBI 6FR TG (CATHETERS) IMPLANT
CATH LAUNCHER 6FR JL3.5 (CATHETERS) IMPLANT
DEVICE RAD COMP TR BAND LRG (VASCULAR PRODUCTS) IMPLANT
GLIDESHEATH SLEND SS 6F .021 (SHEATH) IMPLANT
GUIDEWIRE .025 260CM (WIRE) IMPLANT
GUIDEWIRE PRESSURE X 175 (WIRE) IMPLANT
KIT ESSENTIALS PG (KITS) IMPLANT
KIT HEMO VALVE WATCHDOG (MISCELLANEOUS) IMPLANT
PACK CARDIAC CATHETERIZATION (CUSTOM PROCEDURE TRAY) ×2 IMPLANT
SET ATX-X65L (MISCELLANEOUS) IMPLANT
SHEATH GLIDE SLENDER 4/5FR (SHEATH) IMPLANT
WIRE EMERALD 3MM-J .035X260CM (WIRE) IMPLANT

## 2023-10-09 NOTE — Plan of Care (Signed)

## 2023-10-09 NOTE — Interval H&P Note (Signed)
History and Physical Interval Note:  10/09/2023 6:52 AM  Kyle Matthews  has presented today for surgery, with the diagnosis of NSTEMI, AS.  The various methods of treatment have been discussed with the patient and family. After consideration of risks, benefits and other options for treatment, the patient has consented to  Procedure(s): RIGHT/LEFT HEART CATH AND CORONARY ANGIOGRAPHY (N/A) as a surgical intervention.  The patient's history has been reviewed, patient examined, no change in status, stable for surgery.  I have reviewed the patient's chart and labs.  Questions were answered to the patient's satisfaction.     [name]

## 2023-10-09 NOTE — Progress Notes (Signed)
Progress Note   Patient: Kyle Matthews LOV:[id number] DOB: [birth year] DOA: 10/03/2023     6 DOS: the patient was seen and examined on 10/09/2023   Brief hospital course: 77 year old male past medical history of  moderate to severe AS and diastolic congestive heart failure, hypertension, hyperlipidemia, tobacco use, BPH with chronic bladder outlet obstruction and chronic indwelling Foley catheter, prior CVA 2007 presented to the [facility] emergency department complaining of chest pain 7/10 and shortness of breath that has been intermittently for about 3 weeks prior to hospitalization with concurrent gross hematuria.    Patient was found to have a hemoglobin of  6.5 in the [facility] emergency department status post 1 unit packed red blood cell transfusion.    His cardiac BNP was 547.0 and high-sensitivity troponin tests 856 and 1395 concerning for NSTEMI.     Cardiology was consulted. Patient was felt to be suffering from an NSTEMI but due to the patient's ongoing bleeding cardiology advised not to proceed with heparin or aspirin therapy.    Patient was then transferred to [facility] for further medical management after it was determined that patient was not going to undergo immediate invasive cardiac intervention.  Upon arrival to [facility] hemoglobin was noted to be 6.8 the evening of 10/15 prompting 1 additional unit of packed red blood cells to be transfused.  A third unit was additionally transfused on 10/17.  Troponins gradually decreased after blood transfusion with resolution of chest pain.    NSTEMI was felt to be secondary to profound anemia and profound anemia was felt to be secondary to several weeks of hematuria status post Foley catheter placement.    Cardiology additionally recommends the patient's severe aortic stenosis be addressed prior to the patient proceeding with upcoming prostate surgery (which was originally scheduled for 10/25 and has since been  postponed).  10/20 transfer to [facility] for further work up with cardiac catheterization.  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  Plan to continue work up for TAVR.   Assessment and Plan: * Acute blood loss anemia Hematuria.  BPH chronic Foley catheter.   Iron deficiency anemia.  SP 3 units PRBC transfusion. IV iron infusion.   Cardiology recommending rectifying severe arctic stenosis via TAVR prior to proceeding with urologic surgery. Keeping current Foley catheter in place Continue Proscar Hgb has been stable, with no further macroscopic hematuria.   NSTEMI (non-ST elevated myocardial infarction) (HCC) MI type 2 due to anemia, demand ischemia.  Plan to continue medical therapy, keep hgb 8 or above.  Holding antiplatelet therapy. Continue statin.   Acute on chronic diastolic CHF (congestive heart failure) (HCC) Echocardiogram with preserved LV systolic function EF 55 to 60%, mild LVH, RV systolic function preserved, RVSP 44.5 mmHg.  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 RA mean 7 RV 42/-2 PA mean 24 PCWP 14 with V waves up to 23 mmHg. PVR 1.5 woods Cardiac output 6.5 and index 3,8 (fick).  Currently patient is euvolemic.  Patient is 4,458 ml negative fluid balance since admission.   Urine output 2,200 Systolic blood pressure 130 to 150 mmHg.   Continue blood pressure control with carvedilol.  Blood pressure control with amlodipine  As needed furosemide.   Acute  hypoxemic respiratory failure due to acute cardiogenic pulmonary edema.  02 saturation today is 95% on room air, resolved respiratory failure.  Patient had short course of antibiotic therapy,  pneumonia has been ruled out.   Essential hypertension Continue blood pressure control with carvedilol and amlodipine.   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  Dyslipidemia, goal LDL below 70 Continue statin therapy  Nicotine dependence, cigarettes, uncomplicated Counseling daily on cessation.        Subjective: Patient with no chest pain or dyspnea, no abdominal pain and no hematuria.   Physical Exam: Vitals:   10/09/23 1110 10/09/23 1145 10/09/23 1205 10/09/23 1316  BP: (!) 135/55 (!) 113/48 132/61 (!) 127/50  Pulse: 61 (!) 56 (!) 57 61  Resp: 15 16  18   Temp:  98.4 F (36.9 C)    TempSrc:  Oral    SpO2: 94% 94% 94% 95%  Weight:      Height:       Neurology awake and alert ENT with mild pallor Cardiovascular with S1 and S2 present and regular, positive systolic murmur at the base, no gallops, or rubs No JVD Respiratory with no rales or wheezing, no rhonchi Abdomen with no distention  No lower extremity edema  Data Reviewed:    Family Communication: I spoke with patient's wife at the bedside, we talked in detail about patient's condition, plan of care and prognosis and all questions were addressed.   Disposition: Status is: Inpatient Remains inpatient appropriate because: aortic stenosis work up   Planned Discharge Destination: Home    Author: Coralie Keens, MD 10/09/2023 2:27 PM  For on call review [email or web address].

## 2023-10-09 NOTE — Progress Notes (Signed)
   Patient Name: Aydan Zagorski Date of Encounter: 10/09/2023 [facility] Cardiologist: Maisie Fus, MD   Interval Summary  .    No complaints, family at bedside. Planned for cardiac cath today.   Vital Signs .    Vitals:   10/08/23 1957 10/09/23 0340 10/09/23 0802 10/09/23 0818  BP:  (!) 147/66 (!) 142/67   Pulse:  71 68 70  Resp:  18 18 18   Temp:  98.2 F (36.8 C) 98.4 F (36.9 C)   TempSrc:  Oral Oral   SpO2: 96% 95% 95% 98%  Weight:  67.3 kg    Height:        Intake/Output Summary (Last 24 hours) at 10/09/2023 0943 Last data filed at 10/09/2023 0351 Gross per 24 hour  Intake 897 ml  Output 1650 ml  Net -753 ml      10/09/2023    3:40 AM 10/08/2023    4:16 AM 10/07/2023    5:00 AM  Last 3 Weights  Weight (lbs) 148 lb 6.4 oz 151 lb 3.8 oz 156 lb 15.5 oz  Weight (kg) 67.314 kg 68.6 kg 71.2 kg      Telemetry/ECG    Sinus Rhythm - Personally Reviewed  Physical Exam .   GEN: No acute distress.   Neck: No JVD Cardiac: RRR, 3/6 harsh systolic murmur, no rubs, or gallops.  Respiratory: Clear to auscultation bilaterally. GI: Soft, nontender, non-distended  MS: No edema  Assessment & Plan .    77 y.o. male with a history of severe aortic stenosis/ moderate aortic regurgitation, chronic RBBB, hypertension, hyperlipidemia, CVA in 2007, BPH with chronic bladder outlet obstruction and chronic indwelling Foley, and tobacco abuse who presented to [facility] on 10/03/2023 with intermittent chest pain and shortness of breath for the past 3 weeks. Initially transferred to [facility] but found to have AS and transferred to [facility] for further evaluation  NSTEMI -- presented initially with intermittent chest pain and ruled in for NSTEMI, peak troponin 4814. Elevated could be 2/2 to demand ischemia from severe anemia and acute hypoxic respiratory failure with underlying AS -- hgb has stabilized -- has need for up coming prostate surgery -- needs ischemic  evaluation, planned for cardiac cath today  Severe aortic stenosis -- Echo this admission showed LVEF of 55-60% with normal wall motion, normal RV, moderate biatrial enlargement, and severe aortic stenosis with mean gradient of .  -- cath plan as above, anemia appears related to hematuria which has improved -- given the need to plan timing of his prostate surgery for AKI/swelling hematuria (originally 10/13/23), expediting his work up for AS evaluation is important for multi-disciplinary discussion of timing of his interventions  Symptomatic Anemia Hematuria -- Hgb 6.5 on admission, s/p 3 units of PRBCs -- Hgb up to 8.8 yesterday -- no recurrent hematuria since admission  HTN -- reasonably controlled -- continue coreg 3.125mg  BID, will hold spiro with AS -- resume amlodipine 5mg  daily  Per primary PNA BPH with chronic BOO s/p indwelling foley  For questions or updates, please contact [facility] Please consult [email or web address] for contact info under        Signed, Laverda Page, NP

## 2023-10-09 NOTE — TOC Initial Note (Signed)
Transition of Care [facility]) - Initial/Assessment Note    Patient Details  Name: Kyle Matthews MRN: [id number] Date of Birth: 16-Feb-1946  Transition of Care [facility]) CM/SW Contact:    Leone Haven, RN Phone Number: 10/09/2023, 5:04 PM  Clinical Narrative:                 From home with spouse, has PCP and insurance on file, states has no HH services in place at this time or DME at home.  States family member will transport them home at [employer] and family is support system, states gets medications from [place] in [place].  Pta self ambulatory.   Expected Discharge Plan: Home/Self Care Barriers to Discharge: Continued Medical Work up   Patient Goals and CMS Choice Patient states their goals for this hospitalization and ongoing recovery are:: return home   Choice offered to / list presented to : NA      Expected Discharge Plan and Services In-house Referral: NA Discharge Planning Services: CM Consult Post Acute Care Choice: NA Living arrangements for the past 2 months: Single Family Home                 DME Arranged: N/A DME Agency: NA       HH Arranged: NA          Prior Living Arrangements/Services Living arrangements for the past 2 months: Single Family Home Lives with:: Spouse Patient language and need for interpreter reviewed:: Yes Do you feel safe going back to the place where you live?: Yes      Need for Family Participation in Patient Care: Yes (Comment) Care giver support system in place?: Yes (comment)   Criminal Activity/Legal Involvement Pertinent to Current Situation/Hospitalization: No - Comment as needed  Activities of Daily Living   ADL Screening (condition at time of admission) Independently performs ADLs?: Yes (appropriate for developmental age) Is the patient deaf or have difficulty hearing?: No Does the patient have difficulty seeing, even when wearing glasses/contacts?: No Does the patient have difficulty concentrating, remembering, or  making decisions?: No  Permission Sought/Granted Permission sought to share information with : Case Manager Permission granted to share information with : Yes, Verbal Permission Granted              Emotional Assessment Appearance:: Appears stated age Attitude/Demeanor/Rapport: Engaged Affect (typically observed): Appropriate Orientation: : Oriented to Self, Oriented to Place, Oriented to  Time, Oriented to Situation Alcohol / Substance Use: Not Applicable Psych Involvement: No (comment)  Admission diagnosis:  NSTEMI (non-ST elevated myocardial infarction) (HCC) [I21.4] Acute respiratory failure with hypoxia (HCC) [J96.01] Multifocal pneumonia [J18.9] Patient Active Problem List   Diagnosis Date Noted   Gross hematuria 10/07/2023   BPH with obstruction/lower urinary tract symptoms 10/04/2023   Acute on chronic diastolic CHF (congestive heart failure) (HCC) 10/04/2023   Acute blood loss anemia 10/03/2023   Kidney lesion, native, left 10/03/2023   Acute respiratory failure with hypoxia (HCC) 10/03/2023   Lesion of right native kidney 10/03/2023   Elevated brain natriuretic peptide (BNP) level 10/03/2023   NSTEMI (non-ST elevated myocardial infarction) (HCC) 10/03/2023   SOB (shortness of breath) 10/03/2023   Bladder outlet obstruction 10/03/2023   Chronic indwelling Foley catheter 10/03/2023   Hematuria 10/03/2023   Enlarged prostate 10/03/2023   Moderate aortic regurgitation 10/03/2023   Dyslipidemia, goal LDL below 70 05/20/2017   Nicotine dependence, cigarettes, uncomplicated 05/20/2017   Carotid bruit 05/19/2017   Essential hypertension    RBBB 04/13/2017   Severe  aortic stenosis 04/13/2017   PCP:  Mila Palmer, MD Pharmacy:   [facility] [phone] - , [place] - [address]  [place] [address] Phone: [phone] Fax: [phone]     Social Determinants of Health (SDOH) Social  History: SDOH Screenings   Food Insecurity: No Food Insecurity (10/04/2023)  Housing: Low Risk  (10/04/2023)  Transportation Needs: No Transportation Needs (10/04/2023)  Utilities: Not At Risk (10/04/2023)  Tobacco Use: High Risk (10/03/2023)   SDOH Interventions:     Readmission Risk Interventions     No data to display

## 2023-10-09 NOTE — Care Management Important Message (Signed)
Important Message  Patient Details  Name: Kyle Matthews MRN: [id number] Date of Birth: Apr 28, 1946   Important Message Given:  Yes - Medicare IM     Dorena Bodo 10/09/2023, 3:16 PM

## 2023-10-09 NOTE — Consult Note (Addendum)
HEART AND VASCULAR CENTER   MULTIDISCIPLINARY HEART VALVE TEAM  Cardiology Consultation:   Patient ID: Kyle Matthews MRN: [id number]; DOB: 02-Apr-1946  Admit date: 10/03/2023 Date of Consult: 10/09/2023  Primary Care Provider: Mila Palmer, MD [facility] Cardiologist: Maisie Fus, MD  [facility] Electrophysiologist:  None    Patient Profile:   Kyle Matthews is a 77 y.o. 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 Jacinto Halim.    History of Present Illness:   Kyle Matthews has been known to have aortic valve disease since 2018. Echo at [facility] on 10/10/22 showed EF 65-70%, mild MR, severe AS with a mean gradient of 43 mm hg and moderate AI. There was a question of a TIA at the time with transient leg weakness. CTA head and neck was unremarkable. Brain MRI showed chronic lacunar infarcts chronic microvascular ischemic disease.   He was seen in the office by Carolan Clines MD on 04/28/23 and noted to have transient chest pain and nausea but overall felt to be asymptomatic and continued surveillance of aortic valve was recommended.   He has issues with BPH with chronic bladder outlet obstruction and urinary retention requiring an indwelling foley. CT abdomen on 08/17/23 showed severe hydroureteronephrosis bilaterally with no obstructing stone. Prostate gland is enlarged in the urinary bladder is markedly distended suggesting bladder outlet obstruction. He has had ongoing issues with hematuria with the foley. He presented to [facility] on 09/23/2023 with bloody drainage and treated with Keflex. He is awaiting robotic assisted prostatectomy on 10/25 with Dr. Berneice Heinrich (now cancelled).  He re-presented to [facility] on 10/03/23 with chest pain,  shortness of breath and LE edema x3 weeks. He was noted to be hypoxic with 02 sats 82% on RA as well as acute anemia and PNA. Pertinent labs: Hg 6.5, BNP 547, hsTrop peak 4814. CTA of the chest was negative for pulmonary embolism but possible multifocal pneumonia. He was treated with IV abx. Heparin held given acute anemia and transfused 3 UPRBCs with improvement of Hg to 8.8. Anemia felt to be 2/2 hematuria and not hemolysis (LDH negative). He was transferred to [facility] for further work up and evaluation. Repeat echo showed EF 55-60%, mild to mod MR, and severe AS with mean gradient 42 mm hg, peak gradient 69.2 mm hg, AVA 0.89 cm2 as well as moderate to severe AI. He underwent L/RHC today which showed 50% Ost LM lesion with an RFR of 0.83 and moderate diffuse disease of the right coronary artery.  The structural heart team is consulted for consideration of TAVR vs SAVR/CABG. He is seen sitting in bed with his wife at his side. He reports 3 weeks of exertional shortness of breath and chest pain that would resolve with rest. He also had LE edema, orthopnea and PND. On the day of admission he developed symptoms at rest that would not resolve and he came in for evaluation. He also reports ongoing fatigue. He has not had recurrent chest pain since admission. His LE edema and orthopnea have also resolved. He is quite sedentary at baseline and the most activity he gets is using his push mower to cut the lawn, although he hasn't been able to do that for at least 6 weeks. He  does not see a dentist on a regular basis but reports no ongoing issues. He is a current everyday smoker with a 20 pack year history of smoking.    Past Medical History:  Diagnosis Date   BPH (benign prostatic hyperplasia)    Status post biopsy in 2009. - w/ LUTS   Essential hypertension    Hyperlipidemia    On Crestor - followed by PCP   Moderate aortic regurgitation 04/2014   Moderate AS and AR - on echocardiogram   Moderate calcific aortic  stenosis 04/2017   Stroke [facility]) 2007   No true etiology noted    Past Surgical History:  Procedure Laterality Date   TRANSTHORACIC ECHOCARDIOGRAM  04/28/2017    EF 65-70%. GR 1 DD. No regional wall motion abnormality. Moderate aortic stenosis with moderate regurgitation. Mean gradient 34 mmHg.     Home Medications:  Prior to Admission medications   Medication Sig Start Date End Date Taking? Authorizing Provider  amLODipine-benazepril (LOTREL) 10-40 MG capsule Take 1 capsule by mouth daily. 03/16/17  Yes [provider]  carvedilol (COREG) 3.125 MG tablet Take 1 tablet (3.125 mg total) by mouth 2 (two) times daily. Patient taking differently: Take 3.125 mg by mouth 2 (two) times daily with a meal. 10/25/22  Yes Branch, Alben Spittle, MD  cephALEXin (KEFLEX) 500 MG capsule Take 500 mg by mouth 3 (three) times daily.   Yes [provider]  finasteride (PROSCAR) 5 MG tablet Take 5 mg by mouth every evening.   Yes [provider]  rosuvastatin (CRESTOR) 40 MG tablet Take 40 mg by mouth daily.   Yes [provider]    Inpatient Medications: Scheduled Meds:  amLODipine  5 mg Oral Daily   carvedilol  3.125 mg Oral BID WC   Chlorhexidine Gluconate Cloth  6 each Topical Daily   finasteride  5 mg Oral QPM   ipratropium-albuterol  3 mL Nebulization BID   rosuvastatin  40 mg Oral QPM   sodium chloride flush  3 mL Intravenous Q12H   Continuous Infusions:  sodium chloride     sodium chloride     PRN Meds: sodium chloride, acetaminophen, bisacodyl, hydrALAZINE, labetalol, ondansetron **OR** ondansetron (ZOFRAN) IV, oxyCODONE, polyethylene glycol, sodium chloride flush, traZODone  Allergies:    Allergies  Allergen Reactions   Tetanus Toxoids     Childhood Allergy     Social History:   Social History   Socioeconomic History   Marital status: Married    Spouse name: Not on file   Number of children: 1   Years of education: Not on file   Highest education  level: Not on file  Occupational History   Occupation: Retired    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 Lives with his wife of 49 years. He is a retired Marine scientist used to work for [employer]   4 cups of coffee a day.    No structured exercise  regimen   Social Determinants of Health   Financial Resource Strain: Not on file  Food Insecurity: No Food Insecurity (10/04/2023)   Hunger Vital Sign    Worried About Running Out of Food in the Last Year: Never true    Ran Out of Food in the Last Year: Never true  Transportation Needs: No Transportation Needs (10/04/2023)   PRAPARE - Administrator, Civil Service (Medical): No    Lack of Transportation (Non-Medical): No  Physical Activity: Not on file  Stress: Not on file  Social Connections: Not on file  Intimate Partner Violence: Not At Risk (10/04/2023)   Humiliation, Afraid, Rape, and Kick questionnaire    Fear of Current or Ex-Partner: No    Emotionally Abused: No    Physically Abused: No    Sexually Abused: No    Family History:   Family History  Problem Relation Age of Onset   Coronary artery disease Mother 54       38. Was diagnosed with a blood clot - suspect that this was a STEMI   Lung cancer Father    Thyroid disease Sister    Healthy Brother 83   Pancreatic cancer Sister 82   Cancer Maternal Grandmother    Heart disease Maternal Grandfather 60   Cancer Paternal Grandmother    Stroke Paternal Grandfather      ROS:  Please see the history  of present illness.  All other ROS reviewed and negative.     Physical Exam/Data:   Vitals:   10/09/23 1105 10/09/23 1110 10/09/23 1145 10/09/23 1205  BP: (!) 134/48 (!) 135/55 (!) 113/48 132/61  Pulse: 62 61 (!) 56 (!) 57  Resp: 15 15 16    Temp:   98.4 F (36.9 C)   TempSrc:   Oral   SpO2: 93% 94% 94% 94%  Weight:      Height:        Intake/Output Summary (Last 24 hours) at 10/09/2023 1302 Last data filed at 10/09/2023 0351 Gross per 24 hour  Intake 717 ml  Output 1650 ml  Net -933 ml      10/09/2023    3:40 AM 10/08/2023    4:16 AM 10/07/2023    5:00 AM  Last 3 Weights  Weight (lbs) 148 lb 6.4 oz 151 lb 3.8 oz 156 lb 15.5 oz  Weight (kg) 67.314 kg 68.6 kg 71.2 kg     Body mass index is 26.29 kg/m.  General:  Well nourished, well developed, in no acute distress HEENT: normal Lymph: no adenopathy Neck: no JVD Cardiac:  normal S1, S2; RRR; 3/6 SEM heard best at RUSB, I did not hear a diastolic murmur.  Lungs:  clear to auscultation bilaterally, no wheezing, rhonchi or rales  Abd: soft, nontender, no hepatomegaly  Ext: no edema Musculoskeletal:  No deformities, BUE and BLE strength normal and equal Skin: warm and dry  Neuro:  CNs 2-12 intact, no focal abnormalities noted Psych:  Normal affect   EKG:  The EKG was personally reviewed and demonstrates:  NSR with RBBB and ST depression/TW abnormalities in inferolateral leads, and V3/V4, HR 73. Telemetry:  Telemetry was personally reviewed and demonstrates:  sinus brady, HR in 50s    Laboratory Data:  High Sensitivity Troponin:   Recent Labs  Lab 10/03/23 0412 10/03/23 0542 10/03/23 1521 10/04/23 0505  TROPONINIHS 856* 1,395* 4,814* 3,357*     Chemistry Recent Labs  Lab 10/07/23 0403 10/08/23 0231 10/09/23 0231  NA 136 135  137  K 3.8 4.0 4.3  CL 103 102 102  CO2 27 28 28   GLUCOSE 90 99 100*  BUN 20 17 16   CREATININE 0.66 0.72 0.76  CALCIUM 8.2* 8.2* 8.8*  GFRNONAA >60 >60 >60  ANIONGAP 6 5 7      Recent Labs  Lab 10/06/23 0354 10/07/23 0403 10/08/23 0231  PROT 5.3* 5.3* 4.9*  ALBUMIN 2.7* 2.6* 2.3*  AST 30 24 17   ALT 26 32 27  ALKPHOS 39 39 41  BILITOT 0.4 0.7 0.4   Hematology Recent Labs  Lab 10/06/23 0354 10/07/23 0403 10/08/23 0231  WBC 10.3 8.7 9.0  RBC 2.93* 3.13* 3.19*  HGB 8.3* 8.9* 8.8*  HCT 27.1* 28.8* 27.3*  MCV 92.5 92.0 85.6  MCH 28.3 28.4 27.6  MCHC 30.6 30.9 32.2  RDW 15.1 14.6 14.0  PLT 170 172 181   BNP Recent Labs  Lab 10/03/23 0412 10/04/23 0509  BNP 547.0* 501.3*    DDimer No results for input(s): "DDIMER" in the last 168 hours.   Radiology/Studies:  CARDIAC CATHETERIZATION  Result Date: 10/09/2023   Ost LM lesion is 50% stenosed. 1.  Moderate left main stenosis with an RFR of 0.83. 2.  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 3.  Fick cardiac output of 6.5 L/min and Fick cardiac index of 3.8 L/min/m with the following hemodynamics:  Right atrial pressure mean of 7  Right ventricular pressure 42/-2 with an end-diastolic pressure of 8 mmHg  Wedge pressure mean of 14 mmHg with V waves to 23 mmHg  PA pressure of 38/16 with a mean of 24 mmHg  PVR of 1.5 Woods units  PA pulsatility index of 3.1 Recommendation: Continue evaluation for aortic valve intervention.  Will obtain TAVR protocol CTA tomorrow.    [place] Cardiomyopathy Questionnaire     10/09/2023   11:30 AM  KCCQ-12  1 a. Ability to shower/bathe Not at all limited  1 b. Ability to walk 1 block Moderately limited  1 c. Ability to hurry/jog Other, Did not do  2. Edema feet/ankles/legs Every morning  3. Limited by fatigue All of the time  4. Limited by dyspnea All of the time  5. Sitting up / on 3+ pillows Every night  6. Limited enjoyment of life Moderately limited  7. Rest of life w/ symptoms Somewhat satisfied  8 a. Participation in hobbies Slightly limited  8 b. Participation in chores Slightly limited  8 c. Visiting family/friends Slightly limited        STS score Procedure Type: Isolated AVR Perioperative Outcome Estimate % Operative Mortality 3.17% Morbidity & Mortality 32.1% Stroke 2.18% Renal Failure 3.04% Reoperation 6.22% Prolonged Ventilation 17.7% Deep Sternal Wound Infection 0.069% Long Hospital Stay (>14 days) 12.3% Short Hospital Stay (<6 days)* 20.2%     Assessment and Plan:   Kyle Matthews is a 77 y.o. male with symptoms of severe, stage D1 aortic stenosis with moderate to severe aortic regurgitation with NYHA Class II symptoms. He is currently admitted for acute anemia related to hematuria (hemolysis labs negative), NSTEMI with left main disease, multifocal PNA and mild HFpEF. I have reviewed the patient's recent echocardiogram which is notable for EF 55-60%, mild to mod MR, and severe AS with mean gradient 42 mm hg, peak gradient 69.2 mm hg, AVA 0.89 cm2 as well as moderate to severe AI. He underwent L/RHC today which showed 50% Ost LM lesion with an RFR of 0.83 and moderate diffuse disease of the right coronary artery.  I have reviewed the natural history of aortic stenosis with the patient. We have discussed the limitations of medical therapy and the poor prognosis associated with symptomatic aortic stenosis. We have reviewed potential treatment options, including palliative medical therapy, conventional surgical aortic valve replacement, and transcatheter aortic valve replacement. We discussed treatment options in the context of this patient's specific comorbid medical conditions.    The patient's predicted risk of mortality with conventional aortic valve replacement is 3.17% primarily based on age, CAD with left main involvement and NSTEMI, COPD with ongoing smoking, TIA, HTN, mod MR and acute anemia felt to be related to hematuria. Other significant comorbid conditions include BPH with chronic bladder outlet obstruction and chronic indwelling foley with need for prostatectomy. Fortunately renal function remains normal.    The structural heart team is consulted for consideration of TAVR vs SAVR/CABG. We discussed typical evaluation which will require a gated cardiac CTA and a CTA of the chest/abdomen/pelvis to evaluate both his cardiac anatomy and peripheral vasculature. Will get this done tomorrow and review with multidisciplinary valve team.  For questions or updates, please contact [facility] Please consult [email or web address] for contact info under    Signed, Cline Crock, PA-C  10/09/2023 1:02 PM  ATTENDING ATTESTATION:  After conducting a review of all available clinical information with the care team, interviewing the patient, and performing a physical exam, I agree with the findings and plan described in this note.   GEN: No acute distress.   HEENT:  MMM, no JVD, no scleral icterus Cardiac: RRR, with 3/6 SEM left lower sternal border   Respiratory: Clear to auscultation bilaterally. GI: Soft, nontender, non-distended  MS: No edema; No deformity. Neuro:  Nonfocal  Vasc:  +2 radial pulses; +2 femoral pulses.  The patient is 77 year old male with a history of mixed aortic valvular disease with severe AS and moderate to severe AI, right bundle branch block, hypertension, hyperlipidemia, remote stroke, and BPH with bladder obstruction complicated by hematuria and AKI with a creatinine of 8.5 in August of this year who was admitted with acute on chronic diastolic heart failure brought on by hematuria and severe anemia.  He received multiple blood transfusions and his hemoglobin is now stabilized at around 8.  He and his wife tell me that he has not been feeling well for the last several months and this started before the summer.  He then presented to an outside hospital in August due to an incidental lab draw which demonstrated a creatinine of 8.6.  He was found to have developed obstructive uropathy.  He was planned to undergo a laparoscopic prostatectomy soon however he presented with acute on  chronic diastolic heart failure.  I reviewed his echocardiogram which demonstrates severe attic stenosis and at least moderate aortic insufficiency.  His coronary angiography study demonstrated a moderate left main lesion with an RFR of 0.83.  He also has moderate diffuse disease of the right coronary artery.  Had a long talk with the patient and his wife.  We will obtain a TAVR protocol CTA.  I think there are 2 potential treatment pathways.  1 includes the TAVR and then PCI of the left main.  However the patient would require obligate dual antiplatelet therapy for preferably 6 months but as little as 3 months.  This would run the risk of further hematuria, need for blood transfusions, and extend the duration until when he could obtain his prostatectomy.  The alternative is surgical aortic valve replacement with CABG.  Given  these issues of dual platelet therapy and hematuria this may be our best option.  The patient did have a CT scan earlier this admission of his chest which demonstrated some aortic arch calcium but no ascending aortic calcification.  We obtain a TAVR protocol CTA and discuss in our structural heart meeting.  Alverda Skeans, MD Pager [phone]

## 2023-10-10 ENCOUNTER — Encounter (HOSPITAL_COMMUNITY): Payer: Self-pay | Admitting: Internal Medicine

## 2023-10-10 ENCOUNTER — Inpatient Hospital Stay (HOSPITAL_COMMUNITY): Payer: Medicare Other

## 2023-10-10 DIAGNOSIS — I25110 Atherosclerotic heart disease of native coronary artery with unstable angina pectoris: Secondary | ICD-10-CM | POA: Diagnosis not present

## 2023-10-10 DIAGNOSIS — I10 Essential (primary) hypertension: Secondary | ICD-10-CM | POA: Diagnosis not present

## 2023-10-10 DIAGNOSIS — I2510 Atherosclerotic heart disease of native coronary artery without angina pectoris: Secondary | ICD-10-CM | POA: Diagnosis not present

## 2023-10-10 DIAGNOSIS — I5033 Acute on chronic diastolic (congestive) heart failure: Secondary | ICD-10-CM | POA: Diagnosis not present

## 2023-10-10 DIAGNOSIS — I2583 Coronary atherosclerosis due to lipid rich plaque: Secondary | ICD-10-CM

## 2023-10-10 DIAGNOSIS — I350 Nonrheumatic aortic (valve) stenosis: Secondary | ICD-10-CM

## 2023-10-10 DIAGNOSIS — D500 Iron deficiency anemia secondary to blood loss (chronic): Secondary | ICD-10-CM | POA: Insufficient documentation

## 2023-10-10 DIAGNOSIS — J449 Chronic obstructive pulmonary disease, unspecified: Secondary | ICD-10-CM | POA: Diagnosis not present

## 2023-10-10 DIAGNOSIS — N401 Enlarged prostate with lower urinary tract symptoms: Secondary | ICD-10-CM | POA: Diagnosis not present

## 2023-10-10 DIAGNOSIS — D62 Acute posthemorrhagic anemia: Secondary | ICD-10-CM | POA: Diagnosis not present

## 2023-10-10 LAB — BASIC METABOLIC PANEL
Anion gap: 7 (ref 5–15)
BUN: 14 mg/dL (ref 8–23)
CO2: 27 mmol/L (ref 22–32)
Calcium: 8.6 mg/dL — ABNORMAL LOW (ref 8.9–10.3)
Chloride: 104 mmol/L (ref 98–111)
Creatinine, Ser: 0.68 mg/dL (ref 0.61–1.24)
GFR, Estimated: >60 mL/min (ref >60)
Glucose, Bld: 104 mg/dL — ABNORMAL HIGH (ref 70–99)
Potassium: 4.2 mmol/L (ref 3.5–5.1)
Sodium: 138 mmol/L (ref 135–145)

## 2023-10-10 LAB — CBC
HCT: 30.7 % — ABNORMAL LOW (ref 39.0–52.0)
Hemoglobin: 10 g/dL — ABNORMAL LOW (ref 13.0–17.0)
MCH: 28.4 pg (ref 26.0–34.0)
MCHC: 32.6 g/dL (ref 30.0–36.0)
MCV: 87.2 fL (ref 80.0–100.0)
Platelets: 218 10*3/uL (ref 150–400)
RBC: 3.52 MIL/uL — ABNORMAL LOW (ref 4.22–5.81)
RDW: 14.8 % (ref 11.5–15.5)
WBC: 9.9 10*3/uL (ref 4.0–10.5)
nRBC: 0 % (ref 0.0–0.2)

## 2023-10-10 MED ORDER — IPRATROPIUM-ALBUTEROL 0.5-2.5 (3) MG/3ML IN SOLN: 3.0000 mL | Freq: Two times a day (BID) | RESPIRATORY_TRACT | Status: DC | PRN

## 2023-10-10 MED ORDER — ORAL CARE MOUTH RINSE: 15.0000 mL | OROMUCOSAL | Status: DC | PRN

## 2023-10-10 MED ORDER — SODIUM CHLORIDE 0.9 % IV SOLN
200.0000 mg | Freq: Once | INTRAVENOUS | Status: AC
  Administered 2023-10-10: 200 mg via INTRAVENOUS
  Filled 2023-10-10: qty 10

## 2023-10-10 MED ORDER — IRON SUCROSE 200 MG IVPB - SIMPLE MED
200.0000 mg | Freq: Once | Status: DC
  Filled 2023-10-10: qty 110

## 2023-10-10 MED ORDER — SENNOSIDES-DOCUSATE SODIUM 8.6-50 MG PO TABS
2.0000 | ORAL_TABLET | Freq: Every day | ORAL | Status: DC
  Administered 2023-10-10: 2 via ORAL
  Filled 2023-10-10 (×2): qty 2

## 2023-10-10 MED ORDER — IOHEXOL 350 MG/ML SOLN
95.0000 mL | Freq: Once | INTRAVENOUS | Status: AC | PRN
  Administered 2023-10-10: 95 mL via INTRAVENOUS

## 2023-10-10 NOTE — Plan of Care (Signed)

## 2023-10-10 NOTE — Progress Notes (Addendum)
Mobility Specialist Progress Note:   10/10/23 0926  Mobility  Activity Ambulated with assistance in hallway  Level of Assistance Contact guard assist, steadying assist  Assistive Device None  Distance Ambulated (ft) 450 ft  Activity Response Tolerated well  Mobility Referral Yes  $Mobility charge 1 Mobility  Mobility Specialist Start Time (ACUTE ONLY) [id number]  Mobility Specialist Stop Time (ACUTE ONLY) [id number]  Mobility Specialist Time Calculation (min) (ACUTE ONLY) 12 min   Pt received in bed, eager for mobility session. Required no AD, just CGA for safety. Tolerated well, asx throughout. Returned pt to room , NT at bedside. Pt sitting comfortably in chair. All needs met, call bell in reach.   Pre Mobility HR: 77bpm  During Mobility HR: 82 bpm  Feliciana Rossetti Mobility Specialist Please contact via Special educational needs teacher or  Rehab office at [phone]

## 2023-10-10 NOTE — Progress Notes (Signed)
Progress Note   Patient: Kyle Matthews WUJ:[id number] DOB: January 05, 1946 DOA: 10/03/2023     7 DOS: the patient was seen and examined on 10/10/2023   Brief hospital course: 77 year old male past medical history of  moderate to severe AS and diastolic congestive heart failure, hypertension, hyperlipidemia, tobacco use, BPH with chronic bladder outlet obstruction and chronic indwelling Foley catheter, prior CVA 2007 presented to the [facility] emergency department complaining of chest pain 7/10 and shortness of breath that has been intermittently for about 3 weeks prior to hospitalization with concurrent gross hematuria.    Patient was found to have a hemoglobin of  6.5 in the [facility] emergency department status post 1 unit packed red blood cell transfusion.    His cardiac BNP was 547.0 and high-sensitivity troponin tests 856 and 1395 concerning for NSTEMI.     Cardiology was consulted. Patient was felt to be suffering from an NSTEMI but due to the patient's ongoing bleeding cardiology advised not to proceed with heparin or aspirin therapy.    Patient was then transferred to [facility] for further medical management after it was determined that patient was not going to undergo immediate invasive cardiac intervention.  Upon arrival to [facility] hemoglobin was noted to be 6.8 the evening of 10/15 prompting 1 additional unit of packed red blood cells to be transfused.  A third unit was additionally transfused on 10/17.  Troponins gradually decreased after blood transfusion with resolution of chest pain.    NSTEMI was felt to be secondary to profound anemia and profound anemia was felt to be secondary to several weeks of hematuria status post Foley catheter placement.    Cardiology additionally recommends the patient's severe aortic stenosis be addressed prior to the patient proceeding with upcoming prostate surgery (which was originally scheduled for 10/25 and has since been  postponed).  10/20 transfer to [facility] for further work up with cardiac catheterization.  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  Plan to continue work up for TAVR.  10/22 Plan for possible left main stenting and or TAVR. Follow up with final cardiology and CT surgery recommendations.   Assessment and Plan: * Acute blood loss anemia Hematuria.  BPH chronic Foley catheter.   Iron deficiency anemia.  SP 3 units PRBC transfusion. IV iron infusion.   Cardiology recommending rectifying severe arctic stenosis via TAVR prior to proceeding with urologic surgery. Keeping current Foley catheter in place Continue Proscar Hgb has been stable, with no further macroscopic hematuria.   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 (NSTEMI)  Plan to continue medical therapy, keep hgb 8 or above.  Holding antiplatelet therapy. Continue statin.   Acute on chronic diastolic CHF (congestive heart failure) (HCC) Echocardiogram with preserved LV systolic function EF 55 to 60%, mild LVH, RV systolic function preserved, RVSP 44.5 mmHg.  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 RA mean 7 RV 42/-2 PA mean 24 PCWP 14 with V waves up to 23 mmHg. PVR 1.5 woods Cardiac output 6.5 and index 3,8 (fick).  Currently patient is euvolemic.  Patient is 4,458 ml negative fluid balance since admission.   Urine output 2,725 ml Systolic blood pressure 134 to 108 mmHg.  Continue blood pressure control with carvedilol.  Blood pressure control with amlodipine  As needed furosemide.   Acute hypoxemic respiratory failure due to acute cardiogenic pulmonary edema.  02 saturation today  is 95% on room air, resolved respiratory failure.  Patient had short course of antibiotic therapy, pneumonia has been ruled out.   Essential hypertension Continue blood pressure control with carvedilol and amlodipine.   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  Dyslipidemia, goal LDL below 70 Continue statin therapy  Nicotine dependence, cigarettes, uncomplicated Counseling daily on cessation.        Subjective: Patient with no chest pain, no dyspnea, no edema   Physical Exam: Vitals:   10/10/23 0422 10/10/23 0716 10/10/23 0736 10/10/23 1009  BP:  (!) 134/51  (!) 108/44  Pulse:  72 70 62  Resp:  18 18 18   Temp:  98 F (36.7 C)  98.4 F (36.9 C)  TempSrc:  Oral  Oral  SpO2:  97% 98% 96%  Weight: 66.8 kg     Height:       Neurology awake and alert ENT with no pallor Cardiovascular with S1 and S2 present and regular. Positive systolic murmur at the base No JVD No lower extremity edema Respiratory with no rales or wheezing, no rhonchi Abdomen with no distention  Data Reviewed:    Family Communication: I spoke with patient's wife at the bedside, we talked in detail about patient's condition, plan of care and prognosis and all questions were addressed.   Disposition: Status is: Inpatient Remains inpatient appropriate because: pending final cardiology recommendations.   Planned Discharge Destination: Home      Author: Coralie Keens, MD 10/10/2023 3:19 PM  For on call review [email or web address].

## 2023-10-10 NOTE — Progress Notes (Signed)
Patient Name: Kyle Matthews Date of Encounter: 10/10/2023 [facility] Cardiologist: Maisie Fus, MD   Interval Summary  .    Presently no chest pain or dyspnea.    Vital Signs .    Vitals:   10/10/23 0422 10/10/23 0716 10/10/23 0736 10/10/23 1009  BP:  (!) 134/51  (!) 108/44  Pulse:  72 70 62  Resp:  18 18 18   Temp:  98 F (36.7 C)  98.4 F (36.9 C)  TempSrc:  Oral  Oral  SpO2:  97% 98% 96%  Weight: 66.8 kg     Height:        Intake/Output Summary (Last 24 hours) at 10/10/2023 1148 Last data filed at 10/10/2023 1012 Gross per 24 hour  Intake 1140 ml  Output 1900 ml  Net -760 ml      10/10/2023    4:22 AM 10/09/2023    3:40 AM 10/08/2023    4:16 AM  Last 3 Weights  Weight (lbs) 147 lb 4.3 oz 148 lb 6.4 oz 151 lb 3.8 oz  Weight (kg) 66.8 kg 67.314 kg 68.6 kg      Telemetry/ECG    Sinus Rhythm - Personally Reviewed  Physical Exam .   GEN: No acute distress.   Neck: No JVD Cardiac: RRR, 3/6 harsh systolic murmur, no rubs, or gallops.  Respiratory: Clear to auscultation bilaterally. GI: Soft, nontender, non-distended  MS: No edema  Assessment & Plan .    77 y.o. male with a history of severe aortic stenosis/ moderate aortic regurgitation, chronic RBBB, hypertension, hyperlipidemia, CVA in 2007, BPH with chronic bladder outlet obstruction and chronic indwelling Foley, and tobacco abuse who presented to [facility] on 10/03/2023 with intermittent chest pain and shortness of breath for the past 3 weeks. Initially transferred to [facility] but found to have AS and transferred to [facility] for further evaluation  NSTEMI -- presented initially with intermittent chest pain and ruled in for NSTEMI, peak troponin 4814. Elevated could be 2/2 to demand ischemia from severe anemia and acute hypoxic respiratory failure with underlying AS -- hgb has stabilized -- has need for up coming prostate surgery --Cardiac catheterization revealing left main disease  which appears to be very suitable for PCI however antiplatelet therapy is an issue in view of hematuria.  Patient now being considered for TAVR and possible CABG, further recommendation from structural team still pending.  Severe aortic stenosis and severe aortic regurgitation -- Echo this admission showed LVEF of 55-60% with normal wall motion, normal RV, moderate biatrial enlargement, and severe aortic stenosis with mean gradient of but also has severe AI, which could increase gradients across the aortic valve.  Fairly complex issue. Expediting his work up for AS and CAD evaluation is important for multi-disciplinary discussion of timing of his interventions  Symptomatic Anemia Hematuria -- Hgb 6.5 on admission, s/p 3 units of PRBCs -- Hgb up to 8.8 yesterday -- no recurrent hematuria since admission Iron levels are still low, will go ahead and transfuse iron today.  HTN --Well controlled -- continue coreg 3.125mg  BID --Continue amlodipine 5mg  daily  I discussed with Dr. Alverda Skeans regarding planning for left main stenting and/or TAVR versus medical management, in view of his symptomatic AAS, I personally feel that patient already has had Foley catheter in place for 3 months, we could potentially proceed with left main stenting and TAVR at the same time and continue DAPT for 3 months and then he can come off  of Plavix and have his prostate surgery taken care of.  He will be seen by surgery soon and will continue to coordinate the care.  Patient is willing to proceed with any which way we feel it is appropriate.  He is 77 years of age but very active.   For questions or updates, please contact [facility] Please consult [email or web address] for contact info under    Yates Decamp, MD, [facility] 10/10/2023, 11:54 AM [facility] [address] [place], [place] [address] Phone: [phone]. Fax:  [phone]

## 2023-10-10 NOTE — Consult Note (Signed)
The EKG was personally reviewed and demonstrates:  The EKG was personally reviewed and demonstrates:  NSR with RBBB and ST depression/TW abnormalities in inferolateral leads, and V3/V4, HR 73.  Telemetry:  Telemetry was personally reviewed and demonstrates:  sinus   Cardiac Studies & Procedures   CARDIAC CATHETERIZATION  CARDIAC CATHETERIZATION 10/09/2023  Narrative   Ost LM lesion is 50% stenosed.  1.  Moderate left main stenosis with an RFR of 0.83. 2.  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 3.  Fick cardiac output of 6.5 L/min and Fick cardiac index of 3.8 L/min/m with the following hemodynamics: Right atrial pressure mean of 7 Right ventricular pressure 42/-2 with an end-diastolic pressure of 8 mmHg Wedge pressure mean of 14 mmHg with V waves to 23 mmHg PA pressure of 38/16 with a mean of 24 mmHg PVR of 1.5 Woods units PA pulsatility index of 3.1  Recommendation: Continue evaluation for aortic valve intervention.  Will obtain TAVR protocol CTA tomorrow.  Findings Coronary Findings Diagnostic  Dominance: Right  Left Main Ost LM lesion is 50% stenosed.  Left Anterior Descending The vessel exhibits minimal luminal irregularities.  Ramus Intermedius There is mild diffuse disease throughout the vessel.  Right Coronary Artery There is moderate diffuse disease throughout the vessel.  Intervention  No interventions have been documented.     ECHOCARDIOGRAM  ECHOCARDIOGRAM COMPLETE 10/03/2023  Narrative ECHOCARDIOGRAM REPORT    Patient Name:   Kyle Matthews Date of Exam: 10/03/2023 Medical Rec #:  [id number]  Height:       68.0 in Accession #:    [id number] Weight:       152.6 lb Date of Birth:  16-Jul-1946   BSA:          1.822 m Patient Age:    77 years   BP:           111/68 mmHg Patient Gender: M          HR:           94 bpm. [facility]  Procedure: 2D Echo,  Cardiac Doppler and Color Doppler  Indications:    SBE  History:        Patient has prior history of Echocardiogram examinations, most recent 05/02/2018. Acute MI, Arrythmias:RBBB, Signs/Symptoms:Chest Pain and Shortness of Breath; Risk Factors:Hypertension, Dyslipidemia and Current Smoker.  Sonographer:    Mikki Harbor Referring Phys: [id number] SHERI HAMMOCK  IMPRESSIONS   1. Left ventricular ejection fraction, by estimation, is 55 to 60%. The left ventricle has normal function. The left ventricle has no regional wall motion abnormalities. There is mild left ventricular hypertrophy. Left ventricular diastolic parameters are indeterminate. Elevated left atrial pressure. 2. Right ventricular systolic function is normal. The right ventricular size is normal. There is mildly elevated pulmonary artery systolic pressure. 3. Left atrial size was moderately dilated. 4. Right atrial size was moderately dilated. 5. The mitral valve is abnormal. Mild to moderate mitral valve regurgitation. No evidence of mitral stenosis. 6. The tricuspid valve is abnormal. 7. The aortic valve was not well visualized. There is severe calcifcation of the aortic valve. There is severe thickening of the aortic valve. Aortic valve regurgitation is moderate to severe. Severe aortic valve stenosis. Severe aortic stenosis is present. Aortic valve mean gradient measures 42.0 mmHg. Aortic valve peak gradient measures 69.2 mmHg. Aortic valve area, by VTI measures 0.89 cm. 8. The inferior vena cava is dilated in size with >50% respiratory variability, suggesting right atrial pressure  The EKG was personally reviewed and demonstrates:  The EKG was personally reviewed and demonstrates:  NSR with RBBB and ST depression/TW abnormalities in inferolateral leads, and V3/V4, HR 73.  Telemetry:  Telemetry was personally reviewed and demonstrates:  sinus   Cardiac Studies & Procedures   CARDIAC CATHETERIZATION  CARDIAC CATHETERIZATION 10/09/2023  Narrative   Ost LM lesion is 50% stenosed.  1.  Moderate left main stenosis with an RFR of 0.83. 2.  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 3.  Fick cardiac output of 6.5 L/min and Fick cardiac index of 3.8 L/min/m with the following hemodynamics: Right atrial pressure mean of 7 Right ventricular pressure 42/-2 with an end-diastolic pressure of 8 mmHg Wedge pressure mean of 14 mmHg with V waves to 23 mmHg PA pressure of 38/16 with a mean of 24 mmHg PVR of 1.5 Woods units PA pulsatility index of 3.1  Recommendation: Continue evaluation for aortic valve intervention.  Will obtain TAVR protocol CTA tomorrow.  Findings Coronary Findings Diagnostic  Dominance: Right  Left Main Ost LM lesion is 50% stenosed.  Left Anterior Descending The vessel exhibits minimal luminal irregularities.  Ramus Intermedius There is mild diffuse disease throughout the vessel.  Right Coronary Artery There is moderate diffuse disease throughout the vessel.  Intervention  No interventions have been documented.     ECHOCARDIOGRAM  ECHOCARDIOGRAM COMPLETE 10/03/2023  Narrative ECHOCARDIOGRAM REPORT    Patient Name:   Kyle Matthews Date of Exam: 10/03/2023 Medical Rec #:  [id number]  Height:       68.0 in Accession #:    [id number] Weight:       152.6 lb Date of Birth:  16-Jul-1946   BSA:          1.822 m Patient Age:    77 years   BP:           111/68 mmHg Patient Gender: M          HR:           94 bpm. [facility]  Procedure: 2D Echo,  Cardiac Doppler and Color Doppler  Indications:    SBE  History:        Patient has prior history of Echocardiogram examinations, most recent 05/02/2018. Acute MI, Arrythmias:RBBB, Signs/Symptoms:Chest Pain and Shortness of Breath; Risk Factors:Hypertension, Dyslipidemia and Current Smoker.  Sonographer:    Mikki Harbor Referring Phys: [id number] SHERI HAMMOCK  IMPRESSIONS   1. Left ventricular ejection fraction, by estimation, is 55 to 60%. The left ventricle has normal function. The left ventricle has no regional wall motion abnormalities. There is mild left ventricular hypertrophy. Left ventricular diastolic parameters are indeterminate. Elevated left atrial pressure. 2. Right ventricular systolic function is normal. The right ventricular size is normal. There is mildly elevated pulmonary artery systolic pressure. 3. Left atrial size was moderately dilated. 4. Right atrial size was moderately dilated. 5. The mitral valve is abnormal. Mild to moderate mitral valve regurgitation. No evidence of mitral stenosis. 6. The tricuspid valve is abnormal. 7. The aortic valve was not well visualized. There is severe calcifcation of the aortic valve. There is severe thickening of the aortic valve. Aortic valve regurgitation is moderate to severe. Severe aortic valve stenosis. Severe aortic stenosis is present. Aortic valve mean gradient measures 42.0 mmHg. Aortic valve peak gradient measures 69.2 mmHg. Aortic valve area, by VTI measures 0.89 cm. 8. The inferior vena cava is dilated in size with >50% respiratory variability, suggesting right atrial pressure  The EKG was personally reviewed and demonstrates:  The EKG was personally reviewed and demonstrates:  NSR with RBBB and ST depression/TW abnormalities in inferolateral leads, and V3/V4, HR 73.  Telemetry:  Telemetry was personally reviewed and demonstrates:  sinus   Cardiac Studies & Procedures   CARDIAC CATHETERIZATION  CARDIAC CATHETERIZATION 10/09/2023  Narrative   Ost LM lesion is 50% stenosed.  1.  Moderate left main stenosis with an RFR of 0.83. 2.  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 3.  Fick cardiac output of 6.5 L/min and Fick cardiac index of 3.8 L/min/m with the following hemodynamics: Right atrial pressure mean of 7 Right ventricular pressure 42/-2 with an end-diastolic pressure of 8 mmHg Wedge pressure mean of 14 mmHg with V waves to 23 mmHg PA pressure of 38/16 with a mean of 24 mmHg PVR of 1.5 Woods units PA pulsatility index of 3.1  Recommendation: Continue evaluation for aortic valve intervention.  Will obtain TAVR protocol CTA tomorrow.  Findings Coronary Findings Diagnostic  Dominance: Right  Left Main Ost LM lesion is 50% stenosed.  Left Anterior Descending The vessel exhibits minimal luminal irregularities.  Ramus Intermedius There is mild diffuse disease throughout the vessel.  Right Coronary Artery There is moderate diffuse disease throughout the vessel.  Intervention  No interventions have been documented.     ECHOCARDIOGRAM  ECHOCARDIOGRAM COMPLETE 10/03/2023  Narrative ECHOCARDIOGRAM REPORT    Patient Name:   Kyle Matthews Date of Exam: 10/03/2023 Medical Rec #:  [id number]  Height:       68.0 in Accession #:    [id number] Weight:       152.6 lb Date of Birth:  16-Jul-1946   BSA:          1.822 m Patient Age:    77 years   BP:           111/68 mmHg Patient Gender: M          HR:           94 bpm. [facility]  Procedure: 2D Echo,  Cardiac Doppler and Color Doppler  Indications:    SBE  History:        Patient has prior history of Echocardiogram examinations, most recent 05/02/2018. Acute MI, Arrythmias:RBBB, Signs/Symptoms:Chest Pain and Shortness of Breath; Risk Factors:Hypertension, Dyslipidemia and Current Smoker.  Sonographer:    Mikki Harbor Referring Phys: [id number] SHERI HAMMOCK  IMPRESSIONS   1. Left ventricular ejection fraction, by estimation, is 55 to 60%. The left ventricle has normal function. The left ventricle has no regional wall motion abnormalities. There is mild left ventricular hypertrophy. Left ventricular diastolic parameters are indeterminate. Elevated left atrial pressure. 2. Right ventricular systolic function is normal. The right ventricular size is normal. There is mildly elevated pulmonary artery systolic pressure. 3. Left atrial size was moderately dilated. 4. Right atrial size was moderately dilated. 5. The mitral valve is abnormal. Mild to moderate mitral valve regurgitation. No evidence of mitral stenosis. 6. The tricuspid valve is abnormal. 7. The aortic valve was not well visualized. There is severe calcifcation of the aortic valve. There is severe thickening of the aortic valve. Aortic valve regurgitation is moderate to severe. Severe aortic valve stenosis. Severe aortic stenosis is present. Aortic valve mean gradient measures 42.0 mmHg. Aortic valve peak gradient measures 69.2 mmHg. Aortic valve area, by VTI measures 0.89 cm. 8. The inferior vena cava is dilated in size with >50% respiratory variability, suggesting right atrial pressure  The EKG was personally reviewed and demonstrates:  The EKG was personally reviewed and demonstrates:  NSR with RBBB and ST depression/TW abnormalities in inferolateral leads, and V3/V4, HR 73.  Telemetry:  Telemetry was personally reviewed and demonstrates:  sinus   Cardiac Studies & Procedures   CARDIAC CATHETERIZATION  CARDIAC CATHETERIZATION 10/09/2023  Narrative   Ost LM lesion is 50% stenosed.  1.  Moderate left main stenosis with an RFR of 0.83. 2.  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 3.  Fick cardiac output of 6.5 L/min and Fick cardiac index of 3.8 L/min/m with the following hemodynamics: Right atrial pressure mean of 7 Right ventricular pressure 42/-2 with an end-diastolic pressure of 8 mmHg Wedge pressure mean of 14 mmHg with V waves to 23 mmHg PA pressure of 38/16 with a mean of 24 mmHg PVR of 1.5 Woods units PA pulsatility index of 3.1  Recommendation: Continue evaluation for aortic valve intervention.  Will obtain TAVR protocol CTA tomorrow.  Findings Coronary Findings Diagnostic  Dominance: Right  Left Main Ost LM lesion is 50% stenosed.  Left Anterior Descending The vessel exhibits minimal luminal irregularities.  Ramus Intermedius There is mild diffuse disease throughout the vessel.  Right Coronary Artery There is moderate diffuse disease throughout the vessel.  Intervention  No interventions have been documented.     ECHOCARDIOGRAM  ECHOCARDIOGRAM COMPLETE 10/03/2023  Narrative ECHOCARDIOGRAM REPORT    Patient Name:   Kyle Matthews Date of Exam: 10/03/2023 Medical Rec #:  [id number]  Height:       68.0 in Accession #:    [id number] Weight:       152.6 lb Date of Birth:  16-Jul-1946   BSA:          1.822 m Patient Age:    77 years   BP:           111/68 mmHg Patient Gender: M          HR:           94 bpm. [facility]  Procedure: 2D Echo,  Cardiac Doppler and Color Doppler  Indications:    SBE  History:        Patient has prior history of Echocardiogram examinations, most recent 05/02/2018. Acute MI, Arrythmias:RBBB, Signs/Symptoms:Chest Pain and Shortness of Breath; Risk Factors:Hypertension, Dyslipidemia and Current Smoker.  Sonographer:    Mikki Harbor Referring Phys: [id number] SHERI HAMMOCK  IMPRESSIONS   1. Left ventricular ejection fraction, by estimation, is 55 to 60%. The left ventricle has normal function. The left ventricle has no regional wall motion abnormalities. There is mild left ventricular hypertrophy. Left ventricular diastolic parameters are indeterminate. Elevated left atrial pressure. 2. Right ventricular systolic function is normal. The right ventricular size is normal. There is mildly elevated pulmonary artery systolic pressure. 3. Left atrial size was moderately dilated. 4. Right atrial size was moderately dilated. 5. The mitral valve is abnormal. Mild to moderate mitral valve regurgitation. No evidence of mitral stenosis. 6. The tricuspid valve is abnormal. 7. The aortic valve was not well visualized. There is severe calcifcation of the aortic valve. There is severe thickening of the aortic valve. Aortic valve regurgitation is moderate to severe. Severe aortic valve stenosis. Severe aortic stenosis is present. Aortic valve mean gradient measures 42.0 mmHg. Aortic valve peak gradient measures 69.2 mmHg. Aortic valve area, by VTI measures 0.89 cm. 8. The inferior vena cava is dilated in size with >50% respiratory variability, suggesting right atrial pressure  [facility]   MULTIDISCIPLINARY HEART VALVE TEAM  Cardiology Consultation:   Patient ID: Kyle Matthews MRN: [id number]; DOB: 1946-03-23  Admit date: 10/03/2023 Date of Consult: 10/11/2023  Primary Care Provider: Mila Palmer, MD [facility] Cardiologist: Maisie Fus, MD  [facility] Electrophysiologist:  None    Patient Profile:   Kyle Matthews is a 77 y.o. 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 Lynnette Caffey.   History of Present Illness:   Kyle Matthews has been known to have aortic valve disease since 2018. Echo at [facility] on 10/10/22 showed EF 65-70%, mild MR, severe AS with a mean gradient of 43 mm hg and moderate AI. There was a question of a TIA at the time with transient leg weakness. CTA head and neck was unremarkable. Brain MRI showed chronic lacunar infarcts chronic microvascular ischemic disease.    He was seen in the office by Carolan Clines MD on 04/28/23 and noted to have transient chest pain and nausea but overall felt to be asymptomatic and continued surveillance of aortic valve was recommended.    He has issues with BPH with chronic bladder outlet obstruction and urinary retention requiring an indwelling foley. He had AKI with a creatinine of 8.5 in August of this year. CT abdomen on 08/17/23 showed severe hydroureteronephrosis bilaterally with no obstructing stone. Prostate gland was enlarged in the urinary bladder is markedly distended suggesting bladder outlet obstruction. Renal function normalized after foley was placed; however, he has had ongoing issues with hematuria with the foley. He presented to [facility] on 09/23/2023 with bloody drainage. Tubing was replaced and he was  treated with Keflex. He is awaiting robotic assisted prostatectomy on 10/25 with Dr. Berneice Heinrich (now cancelled).   He re-presented to [facility] on 10/03/23 with chest pain, shortness of breath and LE edema x3 weeks. He was noted to be hypoxic with 02 sats 82% on RA as well as acute anemia and PNA. Pertinent labs: Hg 6.5, BNP 547, hsTrop peak 4814. CTA of the chest was negative for pulmonary embolism but possible multifocal pneumonia. He was treated with IV abx. Heparin held given acute anemia and transfused 3U PRBCs and iron with improvement of Hg to 10.6. Anemia felt to be 2/2 hematuria and not hemolysis (LDH negative). He was transferred to [facility] for further work up and evaluation. Repeat echo showed EF 55-60%, mild to mod MR, and severe AS with mean gradient 42 mm hg, peak gradient 69.2 mm hg, AVA 0.89 cm2 as well as moderate to severe AI. He underwent [facility] 10/08/21 which showed 50% Ost LM lesion with an RFR of 0.83 and moderate diffuse disease of the right coronary artery. Cardiac gated CTA of the heart revealed anatomical characteristics consistent with aortic stenosis suitable for treatment by transcatheter aortic valve replacement without any significant complicating features and CTA of the aorta and iliac vessels demonstrated what appear to be adequate pelvic vascular access to facilitate a transfemoral approach.     The structural heart team was consulted for consideration of TAVR/PCI vs SAVR/CABG. He was seen by Dr. Lynnette Caffey who felt that TAVR/PCI of the left main would obligate dual antiplatelet therapy for a minimum of 3 months and increase risk of further hematuria, need  [facility]   MULTIDISCIPLINARY HEART VALVE TEAM  Cardiology Consultation:   Patient ID: Kyle Matthews MRN: [id number]; DOB: 1946-03-23  Admit date: 10/03/2023 Date of Consult: 10/11/2023  Primary Care Provider: Mila Palmer, MD [facility] Cardiologist: Maisie Fus, MD  [facility] Electrophysiologist:  None    Patient Profile:   Kyle Matthews is a 77 y.o. 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 Lynnette Caffey.   History of Present Illness:   Kyle Matthews has been known to have aortic valve disease since 2018. Echo at [facility] on 10/10/22 showed EF 65-70%, mild MR, severe AS with a mean gradient of 43 mm hg and moderate AI. There was a question of a TIA at the time with transient leg weakness. CTA head and neck was unremarkable. Brain MRI showed chronic lacunar infarcts chronic microvascular ischemic disease.    He was seen in the office by Carolan Clines MD on 04/28/23 and noted to have transient chest pain and nausea but overall felt to be asymptomatic and continued surveillance of aortic valve was recommended.    He has issues with BPH with chronic bladder outlet obstruction and urinary retention requiring an indwelling foley. He had AKI with a creatinine of 8.5 in August of this year. CT abdomen on 08/17/23 showed severe hydroureteronephrosis bilaterally with no obstructing stone. Prostate gland was enlarged in the urinary bladder is markedly distended suggesting bladder outlet obstruction. Renal function normalized after foley was placed; however, he has had ongoing issues with hematuria with the foley. He presented to [facility] on 09/23/2023 with bloody drainage. Tubing was replaced and he was  treated with Keflex. He is awaiting robotic assisted prostatectomy on 10/25 with Dr. Berneice Heinrich (now cancelled).   He re-presented to [facility] on 10/03/23 with chest pain, shortness of breath and LE edema x3 weeks. He was noted to be hypoxic with 02 sats 82% on RA as well as acute anemia and PNA. Pertinent labs: Hg 6.5, BNP 547, hsTrop peak 4814. CTA of the chest was negative for pulmonary embolism but possible multifocal pneumonia. He was treated with IV abx. Heparin held given acute anemia and transfused 3U PRBCs and iron with improvement of Hg to 10.6. Anemia felt to be 2/2 hematuria and not hemolysis (LDH negative). He was transferred to [facility] for further work up and evaluation. Repeat echo showed EF 55-60%, mild to mod MR, and severe AS with mean gradient 42 mm hg, peak gradient 69.2 mm hg, AVA 0.89 cm2 as well as moderate to severe AI. He underwent [facility] 10/08/21 which showed 50% Ost LM lesion with an RFR of 0.83 and moderate diffuse disease of the right coronary artery. Cardiac gated CTA of the heart revealed anatomical characteristics consistent with aortic stenosis suitable for treatment by transcatheter aortic valve replacement without any significant complicating features and CTA of the aorta and iliac vessels demonstrated what appear to be adequate pelvic vascular access to facilitate a transfemoral approach.     The structural heart team was consulted for consideration of TAVR/PCI vs SAVR/CABG. He was seen by Dr. Lynnette Caffey who felt that TAVR/PCI of the left main would obligate dual antiplatelet therapy for a minimum of 3 months and increase risk of further hematuria, need  [facility]   MULTIDISCIPLINARY HEART VALVE TEAM  Cardiology Consultation:   Patient ID: Kyle Matthews MRN: [id number]; DOB: 1946-03-23  Admit date: 10/03/2023 Date of Consult: 10/11/2023  Primary Care Provider: Mila Palmer, MD [facility] Cardiologist: Maisie Fus, MD  [facility] Electrophysiologist:  None    Patient Profile:   Kyle Matthews is a 77 y.o. 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 Lynnette Caffey.   History of Present Illness:   Kyle Matthews has been known to have aortic valve disease since 2018. Echo at [facility] on 10/10/22 showed EF 65-70%, mild MR, severe AS with a mean gradient of 43 mm hg and moderate AI. There was a question of a TIA at the time with transient leg weakness. CTA head and neck was unremarkable. Brain MRI showed chronic lacunar infarcts chronic microvascular ischemic disease.    He was seen in the office by Carolan Clines MD on 04/28/23 and noted to have transient chest pain and nausea but overall felt to be asymptomatic and continued surveillance of aortic valve was recommended.    He has issues with BPH with chronic bladder outlet obstruction and urinary retention requiring an indwelling foley. He had AKI with a creatinine of 8.5 in August of this year. CT abdomen on 08/17/23 showed severe hydroureteronephrosis bilaterally with no obstructing stone. Prostate gland was enlarged in the urinary bladder is markedly distended suggesting bladder outlet obstruction. Renal function normalized after foley was placed; however, he has had ongoing issues with hematuria with the foley. He presented to [facility] on 09/23/2023 with bloody drainage. Tubing was replaced and he was  treated with Keflex. He is awaiting robotic assisted prostatectomy on 10/25 with Dr. Berneice Heinrich (now cancelled).   He re-presented to [facility] on 10/03/23 with chest pain, shortness of breath and LE edema x3 weeks. He was noted to be hypoxic with 02 sats 82% on RA as well as acute anemia and PNA. Pertinent labs: Hg 6.5, BNP 547, hsTrop peak 4814. CTA of the chest was negative for pulmonary embolism but possible multifocal pneumonia. He was treated with IV abx. Heparin held given acute anemia and transfused 3U PRBCs and iron with improvement of Hg to 10.6. Anemia felt to be 2/2 hematuria and not hemolysis (LDH negative). He was transferred to [facility] for further work up and evaluation. Repeat echo showed EF 55-60%, mild to mod MR, and severe AS with mean gradient 42 mm hg, peak gradient 69.2 mm hg, AVA 0.89 cm2 as well as moderate to severe AI. He underwent [facility] 10/08/21 which showed 50% Ost LM lesion with an RFR of 0.83 and moderate diffuse disease of the right coronary artery. Cardiac gated CTA of the heart revealed anatomical characteristics consistent with aortic stenosis suitable for treatment by transcatheter aortic valve replacement without any significant complicating features and CTA of the aorta and iliac vessels demonstrated what appear to be adequate pelvic vascular access to facilitate a transfemoral approach.     The structural heart team was consulted for consideration of TAVR/PCI vs SAVR/CABG. He was seen by Dr. Lynnette Caffey who felt that TAVR/PCI of the left main would obligate dual antiplatelet therapy for a minimum of 3 months and increase risk of further hematuria, need  The EKG was personally reviewed and demonstrates:  The EKG was personally reviewed and demonstrates:  NSR with RBBB and ST depression/TW abnormalities in inferolateral leads, and V3/V4, HR 73.  Telemetry:  Telemetry was personally reviewed and demonstrates:  sinus   Cardiac Studies & Procedures   CARDIAC CATHETERIZATION  CARDIAC CATHETERIZATION 10/09/2023  Narrative   Ost LM lesion is 50% stenosed.  1.  Moderate left main stenosis with an RFR of 0.83. 2.  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 3.  Fick cardiac output of 6.5 L/min and Fick cardiac index of 3.8 L/min/m with the following hemodynamics: Right atrial pressure mean of 7 Right ventricular pressure 42/-2 with an end-diastolic pressure of 8 mmHg Wedge pressure mean of 14 mmHg with V waves to 23 mmHg PA pressure of 38/16 with a mean of 24 mmHg PVR of 1.5 Woods units PA pulsatility index of 3.1  Recommendation: Continue evaluation for aortic valve intervention.  Will obtain TAVR protocol CTA tomorrow.  Findings Coronary Findings Diagnostic  Dominance: Right  Left Main Ost LM lesion is 50% stenosed.  Left Anterior Descending The vessel exhibits minimal luminal irregularities.  Ramus Intermedius There is mild diffuse disease throughout the vessel.  Right Coronary Artery There is moderate diffuse disease throughout the vessel.  Intervention  No interventions have been documented.     ECHOCARDIOGRAM  ECHOCARDIOGRAM COMPLETE 10/03/2023  Narrative ECHOCARDIOGRAM REPORT    Patient Name:   Kyle Matthews Date of Exam: 10/03/2023 Medical Rec #:  [id number]  Height:       68.0 in Accession #:    [id number] Weight:       152.6 lb Date of Birth:  16-Jul-1946   BSA:          1.822 m Patient Age:    77 years   BP:           111/68 mmHg Patient Gender: M          HR:           94 bpm. [facility]  Procedure: 2D Echo,  Cardiac Doppler and Color Doppler  Indications:    SBE  History:        Patient has prior history of Echocardiogram examinations, most recent 05/02/2018. Acute MI, Arrythmias:RBBB, Signs/Symptoms:Chest Pain and Shortness of Breath; Risk Factors:Hypertension, Dyslipidemia and Current Smoker.  Sonographer:    Mikki Harbor Referring Phys: [id number] SHERI HAMMOCK  IMPRESSIONS   1. Left ventricular ejection fraction, by estimation, is 55 to 60%. The left ventricle has normal function. The left ventricle has no regional wall motion abnormalities. There is mild left ventricular hypertrophy. Left ventricular diastolic parameters are indeterminate. Elevated left atrial pressure. 2. Right ventricular systolic function is normal. The right ventricular size is normal. There is mildly elevated pulmonary artery systolic pressure. 3. Left atrial size was moderately dilated. 4. Right atrial size was moderately dilated. 5. The mitral valve is abnormal. Mild to moderate mitral valve regurgitation. No evidence of mitral stenosis. 6. The tricuspid valve is abnormal. 7. The aortic valve was not well visualized. There is severe calcifcation of the aortic valve. There is severe thickening of the aortic valve. Aortic valve regurgitation is moderate to severe. Severe aortic valve stenosis. Severe aortic stenosis is present. Aortic valve mean gradient measures 42.0 mmHg. Aortic valve peak gradient measures 69.2 mmHg. Aortic valve area, by VTI measures 0.89 cm. 8. The inferior vena cava is dilated in size with >50% respiratory variability, suggesting right atrial pressure  The EKG was personally reviewed and demonstrates:  The EKG was personally reviewed and demonstrates:  NSR with RBBB and ST depression/TW abnormalities in inferolateral leads, and V3/V4, HR 73.  Telemetry:  Telemetry was personally reviewed and demonstrates:  sinus   Cardiac Studies & Procedures   CARDIAC CATHETERIZATION  CARDIAC CATHETERIZATION 10/09/2023  Narrative   Ost LM lesion is 50% stenosed.  1.  Moderate left main stenosis with an RFR of 0.83. 2.  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 3.  Fick cardiac output of 6.5 L/min and Fick cardiac index of 3.8 L/min/m with the following hemodynamics: Right atrial pressure mean of 7 Right ventricular pressure 42/-2 with an end-diastolic pressure of 8 mmHg Wedge pressure mean of 14 mmHg with V waves to 23 mmHg PA pressure of 38/16 with a mean of 24 mmHg PVR of 1.5 Woods units PA pulsatility index of 3.1  Recommendation: Continue evaluation for aortic valve intervention.  Will obtain TAVR protocol CTA tomorrow.  Findings Coronary Findings Diagnostic  Dominance: Right  Left Main Ost LM lesion is 50% stenosed.  Left Anterior Descending The vessel exhibits minimal luminal irregularities.  Ramus Intermedius There is mild diffuse disease throughout the vessel.  Right Coronary Artery There is moderate diffuse disease throughout the vessel.  Intervention  No interventions have been documented.     ECHOCARDIOGRAM  ECHOCARDIOGRAM COMPLETE 10/03/2023  Narrative ECHOCARDIOGRAM REPORT    Patient Name:   Kyle Matthews Date of Exam: 10/03/2023 Medical Rec #:  [id number]  Height:       68.0 in Accession #:    [id number] Weight:       152.6 lb Date of Birth:  16-Jul-1946   BSA:          1.822 m Patient Age:    77 years   BP:           111/68 mmHg Patient Gender: M          HR:           94 bpm. [facility]  Procedure: 2D Echo,  Cardiac Doppler and Color Doppler  Indications:    SBE  History:        Patient has prior history of Echocardiogram examinations, most recent 05/02/2018. Acute MI, Arrythmias:RBBB, Signs/Symptoms:Chest Pain and Shortness of Breath; Risk Factors:Hypertension, Dyslipidemia and Current Smoker.  Sonographer:    Mikki Harbor Referring Phys: [id number] SHERI HAMMOCK  IMPRESSIONS   1. Left ventricular ejection fraction, by estimation, is 55 to 60%. The left ventricle has normal function. The left ventricle has no regional wall motion abnormalities. There is mild left ventricular hypertrophy. Left ventricular diastolic parameters are indeterminate. Elevated left atrial pressure. 2. Right ventricular systolic function is normal. The right ventricular size is normal. There is mildly elevated pulmonary artery systolic pressure. 3. Left atrial size was moderately dilated. 4. Right atrial size was moderately dilated. 5. The mitral valve is abnormal. Mild to moderate mitral valve regurgitation. No evidence of mitral stenosis. 6. The tricuspid valve is abnormal. 7. The aortic valve was not well visualized. There is severe calcifcation of the aortic valve. There is severe thickening of the aortic valve. Aortic valve regurgitation is moderate to severe. Severe aortic valve stenosis. Severe aortic stenosis is present. Aortic valve mean gradient measures 42.0 mmHg. Aortic valve peak gradient measures 69.2 mmHg. Aortic valve area, by VTI measures 0.89 cm. 8. The inferior vena cava is dilated in size with >50% respiratory variability, suggesting right atrial pressure  [facility]   MULTIDISCIPLINARY HEART VALVE TEAM  Cardiology Consultation:   Patient ID: Kyle Matthews MRN: [id number]; DOB: 1946-03-23  Admit date: 10/03/2023 Date of Consult: 10/11/2023  Primary Care Provider: Mila Palmer, MD [facility] Cardiologist: Maisie Fus, MD  [facility] Electrophysiologist:  None    Patient Profile:   Kyle Matthews is a 77 y.o. 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 Lynnette Caffey.   History of Present Illness:   Kyle Matthews has been known to have aortic valve disease since 2018. Echo at [facility] on 10/10/22 showed EF 65-70%, mild MR, severe AS with a mean gradient of 43 mm hg and moderate AI. There was a question of a TIA at the time with transient leg weakness. CTA head and neck was unremarkable. Brain MRI showed chronic lacunar infarcts chronic microvascular ischemic disease.    He was seen in the office by Carolan Clines MD on 04/28/23 and noted to have transient chest pain and nausea but overall felt to be asymptomatic and continued surveillance of aortic valve was recommended.    He has issues with BPH with chronic bladder outlet obstruction and urinary retention requiring an indwelling foley. He had AKI with a creatinine of 8.5 in August of this year. CT abdomen on 08/17/23 showed severe hydroureteronephrosis bilaterally with no obstructing stone. Prostate gland was enlarged in the urinary bladder is markedly distended suggesting bladder outlet obstruction. Renal function normalized after foley was placed; however, he has had ongoing issues with hematuria with the foley. He presented to [facility] on 09/23/2023 with bloody drainage. Tubing was replaced and he was  treated with Keflex. He is awaiting robotic assisted prostatectomy on 10/25 with Dr. Berneice Heinrich (now cancelled).   He re-presented to [facility] on 10/03/23 with chest pain, shortness of breath and LE edema x3 weeks. He was noted to be hypoxic with 02 sats 82% on RA as well as acute anemia and PNA. Pertinent labs: Hg 6.5, BNP 547, hsTrop peak 4814. CTA of the chest was negative for pulmonary embolism but possible multifocal pneumonia. He was treated with IV abx. Heparin held given acute anemia and transfused 3U PRBCs and iron with improvement of Hg to 10.6. Anemia felt to be 2/2 hematuria and not hemolysis (LDH negative). He was transferred to [facility] for further work up and evaluation. Repeat echo showed EF 55-60%, mild to mod MR, and severe AS with mean gradient 42 mm hg, peak gradient 69.2 mm hg, AVA 0.89 cm2 as well as moderate to severe AI. He underwent [facility] 10/08/21 which showed 50% Ost LM lesion with an RFR of 0.83 and moderate diffuse disease of the right coronary artery. Cardiac gated CTA of the heart revealed anatomical characteristics consistent with aortic stenosis suitable for treatment by transcatheter aortic valve replacement without any significant complicating features and CTA of the aorta and iliac vessels demonstrated what appear to be adequate pelvic vascular access to facilitate a transfemoral approach.     The structural heart team was consulted for consideration of TAVR/PCI vs SAVR/CABG. He was seen by Dr. Lynnette Caffey who felt that TAVR/PCI of the left main would obligate dual antiplatelet therapy for a minimum of 3 months and increase risk of further hematuria, need  [facility]ART AND VASCULAR CENTER   MULTIDISCIPLINARY HEART VALVE TEAM  Cardiology Consultation:   Patient ID: Kyle Matthews MRN: [id number]; DOB: 1946-03-23  Admit date: 10/03/2023 Date of Consult: 10/11/2023  Primary Care Provider: Mila Palmer, MD [facility] Cardiologist: Maisie Fus, MD  [facility] Electrophysiologist:  None    Patient Profile:   Kyle Matthews is a 77 y.o. 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 Lynnette Caffey.   History of Present Illness:   Kyle Matthews has been known to have aortic valve disease since 2018. Echo at [facility] on 10/10/22 showed EF 65-70%, mild MR, severe AS with a mean gradient of 43 mm hg and moderate AI. There was a question of a TIA at the time with transient leg weakness. CTA head and neck was unremarkable. Brain MRI showed chronic lacunar infarcts chronic microvascular ischemic disease.    He was seen in the office by Carolan Clines MD on 04/28/23 and noted to have transient chest pain and nausea but overall felt to be asymptomatic and continued surveillance of aortic valve was recommended.    He has issues with BPH with chronic bladder outlet obstruction and urinary retention requiring an indwelling foley. He had AKI with a creatinine of 8.5 in August of this year. CT abdomen on 08/17/23 showed severe hydroureteronephrosis bilaterally with no obstructing stone. Prostate gland was enlarged in the urinary bladder is markedly distended suggesting bladder outlet obstruction. Renal function normalized after foley was placed; however, he has had ongoing issues with hematuria with the foley. He presented to [facility] on 09/23/2023 with bloody drainage. Tubing was replaced and he was  treated with Keflex. He is awaiting robotic assisted prostatectomy on 10/25 with Dr. Berneice Heinrich (now cancelled).   He re-presented to [facility] on 10/03/23 with chest pain, shortness of breath and LE edema x3 weeks. He was noted to be hypoxic with 02 sats 82% on RA as well as acute anemia and PNA. Pertinent labs: Hg 6.5, BNP 547, hsTrop peak 4814. CTA of the chest was negative for pulmonary embolism but possible multifocal pneumonia. He was treated with IV abx. Heparin held given acute anemia and transfused 3U PRBCs and iron with improvement of Hg to 10.6. Anemia felt to be 2/2 hematuria and not hemolysis (LDH negative). He was transferred to [facility] for further work up and evaluation. Repeat echo showed EF 55-60%, mild to mod MR, and severe AS with mean gradient 42 mm hg, peak gradient 69.2 mm hg, AVA 0.89 cm2 as well as moderate to severe AI. He underwent [facility] 10/08/21 which showed 50% Ost LM lesion with an RFR of 0.83 and moderate diffuse disease of the right coronary artery. Cardiac gated CTA of the heart revealed anatomical characteristics consistent with aortic stenosis suitable for treatment by transcatheter aortic valve replacement without any significant complicating features and CTA of the aorta and iliac vessels demonstrated what appear to be adequate pelvic vascular access to facilitate a transfemoral approach.     The structural heart team was consulted for consideration of TAVR/PCI vs SAVR/CABG. He was seen by Dr. Lynnette Caffey who felt that TAVR/PCI of the left main would obligate dual antiplatelet therapy for a minimum of 3 months and increase risk of further hematuria, need  The EKG was personally reviewed and demonstrates:  The EKG was personally reviewed and demonstrates:  NSR with RBBB and ST depression/TW abnormalities in inferolateral leads, and V3/V4, HR 73.  Telemetry:  Telemetry was personally reviewed and demonstrates:  sinus   Cardiac Studies & Procedures   CARDIAC CATHETERIZATION  CARDIAC CATHETERIZATION 10/09/2023  Narrative   Ost LM lesion is 50% stenosed.  1.  Moderate left main stenosis with an RFR of 0.83. 2.  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 3.  Fick cardiac output of 6.5 L/min and Fick cardiac index of 3.8 L/min/m with the following hemodynamics: Right atrial pressure mean of 7 Right ventricular pressure 42/-2 with an end-diastolic pressure of 8 mmHg Wedge pressure mean of 14 mmHg with V waves to 23 mmHg PA pressure of 38/16 with a mean of 24 mmHg PVR of 1.5 Woods units PA pulsatility index of 3.1  Recommendation: Continue evaluation for aortic valve intervention.  Will obtain TAVR protocol CTA tomorrow.  Findings Coronary Findings Diagnostic  Dominance: Right  Left Main Ost LM lesion is 50% stenosed.  Left Anterior Descending The vessel exhibits minimal luminal irregularities.  Ramus Intermedius There is mild diffuse disease throughout the vessel.  Right Coronary Artery There is moderate diffuse disease throughout the vessel.  Intervention  No interventions have been documented.     ECHOCARDIOGRAM  ECHOCARDIOGRAM COMPLETE 10/03/2023  Narrative ECHOCARDIOGRAM REPORT    Patient Name:   Kyle Matthews Date of Exam: 10/03/2023 Medical Rec #:  [id number]  Height:       68.0 in Accession #:    [id number] Weight:       152.6 lb Date of Birth:  16-Jul-1946   BSA:          1.822 m Patient Age:    77 years   BP:           111/68 mmHg Patient Gender: M          HR:           94 bpm. [facility]  Procedure: 2D Echo,  Cardiac Doppler and Color Doppler  Indications:    SBE  History:        Patient has prior history of Echocardiogram examinations, most recent 05/02/2018. Acute MI, Arrythmias:RBBB, Signs/Symptoms:Chest Pain and Shortness of Breath; Risk Factors:Hypertension, Dyslipidemia and Current Smoker.  Sonographer:    Mikki Harbor Referring Phys: [id number] SHERI HAMMOCK  IMPRESSIONS   1. Left ventricular ejection fraction, by estimation, is 55 to 60%. The left ventricle has normal function. The left ventricle has no regional wall motion abnormalities. There is mild left ventricular hypertrophy. Left ventricular diastolic parameters are indeterminate. Elevated left atrial pressure. 2. Right ventricular systolic function is normal. The right ventricular size is normal. There is mildly elevated pulmonary artery systolic pressure. 3. Left atrial size was moderately dilated. 4. Right atrial size was moderately dilated. 5. The mitral valve is abnormal. Mild to moderate mitral valve regurgitation. No evidence of mitral stenosis. 6. The tricuspid valve is abnormal. 7. The aortic valve was not well visualized. There is severe calcifcation of the aortic valve. There is severe thickening of the aortic valve. Aortic valve regurgitation is moderate to severe. Severe aortic valve stenosis. Severe aortic stenosis is present. Aortic valve mean gradient measures 42.0 mmHg. Aortic valve peak gradient measures 69.2 mmHg. Aortic valve area, by VTI measures 0.89 cm. 8. The inferior vena cava is dilated in size with >50% respiratory variability, suggesting right atrial pressure

## 2023-10-11 DIAGNOSIS — D62 Acute posthemorrhagic anemia: Secondary | ICD-10-CM | POA: Diagnosis not present

## 2023-10-11 DIAGNOSIS — I214 Non-ST elevation (NSTEMI) myocardial infarction: Secondary | ICD-10-CM

## 2023-10-11 DIAGNOSIS — I359 Nonrheumatic aortic valve disorder, unspecified: Secondary | ICD-10-CM | POA: Diagnosis not present

## 2023-10-11 DIAGNOSIS — I25110 Atherosclerotic heart disease of native coronary artery with unstable angina pectoris: Secondary | ICD-10-CM | POA: Diagnosis not present

## 2023-10-11 DIAGNOSIS — I5033 Acute on chronic diastolic (congestive) heart failure: Secondary | ICD-10-CM | POA: Diagnosis not present

## 2023-10-11 HISTORY — DX: Non-ST elevation (NSTEMI) myocardial infarction: I21.4

## 2023-10-11 LAB — BASIC METABOLIC PANEL
Anion gap: 6 (ref 5–15)
BUN: 14 mg/dL (ref 8–23)
CO2: 25 mmol/L (ref 22–32)
Calcium: 8.1 mg/dL — ABNORMAL LOW (ref 8.9–10.3)
Chloride: 103 mmol/L (ref 98–111)
Creatinine, Ser: 0.65 mg/dL (ref 0.61–1.24)
GFR, Estimated: >60 mL/min (ref >60)
Glucose, Bld: 87 mg/dL (ref 70–99)
Potassium: 3.8 mmol/L (ref 3.5–5.1)
Sodium: 134 mmol/L — ABNORMAL LOW (ref 135–145)

## 2023-10-11 LAB — CBC
HCT: 29.2 % — ABNORMAL LOW (ref 39.0–52.0)
Hemoglobin: 9.6 g/dL — ABNORMAL LOW (ref 13.0–17.0)
MCH: 28.7 pg (ref 26.0–34.0)
MCHC: 32.9 g/dL (ref 30.0–36.0)
MCV: 87.2 fL (ref 80.0–100.0)
Platelets: 207 10*3/uL (ref 150–400)
RBC: 3.35 MIL/uL — ABNORMAL LOW (ref 4.22–5.81)
RDW: 14.9 % (ref 11.5–15.5)
WBC: 8.8 10*3/uL (ref 4.0–10.5)
nRBC: 0 % (ref 0.0–0.2)

## 2023-10-11 MED ORDER — GUAIFENESIN 100 MG/5ML PO LIQD: 5.0000 mL | ORAL | Status: DC | PRN

## 2023-10-11 MED ORDER — HYDRALAZINE HCL 20 MG/ML IJ SOLN: 10.0000 mg | INTRAMUSCULAR | Status: DC | PRN

## 2023-10-11 MED ORDER — METOPROLOL TARTRATE 5 MG/5ML IV SOLN: 5.0000 mg | INTRAVENOUS | Status: DC | PRN

## 2023-10-11 NOTE — Progress Notes (Signed)
   Patient Name: Kyle Matthews Date of Encounter: 10/11/2023 [facility] Cardiologist: Maisie Fus, MD   Interval Summary  .    Sitting up in bed, no complaints.   Vital Signs .    Vitals:   10/10/23 1935 10/10/23 2358 10/11/23 0513 10/11/23 0725  BP: 123/73 (!) 121/59 119/78 (!) 120/51  Pulse: 70 69 66 65  Resp: 19 18 18 18   Temp: 98.7 F (37.1 C) 98.6 F (37 C) 98.7 F (37.1 C) 98.4 F (36.9 C)  TempSrc: Oral Oral Oral Oral  SpO2: 97% 96% 96% 95%  Weight:   66.5 kg   Height:        Intake/Output Summary (Last 24 hours) at 10/11/2023 0953 Last data filed at 10/11/2023 0837 Gross per 24 hour  Intake 1320 ml  Output 2800 ml  Net -1480 ml      10/11/2023    5:13 AM 10/10/2023    4:22 AM 10/09/2023    3:40 AM  Last 3 Weights  Weight (lbs) 146 lb 11.2 oz 147 lb 4.3 oz 148 lb 6.4 oz  Weight (kg) 66.543 kg 66.8 kg 67.314 kg      Telemetry/ECG    Sinus rhythm - Personally Reviewed  Physical Exam .   GEN: No acute distress.   Neck: No JVD Cardiac: RRR, 3/6 harsh systolic murmur, no rubs, or gallops.  Respiratory: Clear to auscultation bilaterally. GI: Soft, nontender, non-distended  MS: No edema  Assessment & Plan .     77 y.o. male with a history of severe aortic stenosis/ moderate aortic regurgitation, chronic RBBB, hypertension, hyperlipidemia, CVA in 2007, BPH with chronic bladder outlet obstruction and chronic indwelling Foley, and tobacco abuse who presented to [facility] on 10/03/2023 with intermittent chest pain and shortness of breath for the past 3 weeks. Initially transferred to [facility] but found to have AS and transferred to [facility] for further evaluation   NSTEMI -- presented initially with intermittent chest pain and ruled in for NSTEMI, peak troponin 4814.  -- hgb has stabilized -- has need for up coming prostate surgery -- Cardiac catheterization revealing left main disease which is felt to be suitable for PCI however  antiplatelet therapy is an issue in view of hematuria.  Structural team following with consideration for PCI/TAVR vs SAVR/CABG, further recommendation from structural team still pending.   Severe aortic stenosis and severe aortic regurgitation -- Echo this admission showed LVEF of 55-60% with normal wall motion, normal RV, moderate biatrial enlargement, and severe aortic stenosis with mean gradient of but also has severe AI -- as noted, pending final recommendations. Dr. Laneta Simmers to see hopefully this afternoon.    Symptomatic Anemia Hematuria -- Hgb 6.5 on admission, s/p 3 units of PRBCs -- Hgb up to 9.6 -- no recurrent hematuria since admission -- s/p IV iron   HTN -- Well controlled -- continue coreg 3.125mg  BID --Continue amlodipine 5mg  daily    For questions or updates, please contact [facility] Please consult [email or web address] for contact info under        Signed, Laverda Page, NP

## 2023-10-11 NOTE — Plan of Care (Signed)
  Problem: Clinical Measurements: Goal: Ability to maintain clinical measurements within normal limits will improve Outcome: Progressing Goal: Will remain free from infection Outcome: Progressing   Problem: Activity: Goal: Risk for activity intolerance will decrease Outcome: Progressing   Problem: Nutrition: Goal: Adequate nutrition will be maintained Outcome: Progressing   Problem: Pain Managment: Goal: General experience of comfort will improve Outcome: Progressing   Problem: Safety: Goal: Ability to remain free from injury will improve Outcome: Progressing   Problem: Skin Integrity: Goal: Risk for impaired skin integrity will decrease Outcome: Progressing   

## 2023-10-11 NOTE — Progress Notes (Signed)
Mobility Specialist Progress Note:   10/11/23 1100  Mobility  Activity Ambulated with assistance in hallway  Level of Assistance Contact guard assist, steadying assist  Assistive Device None  Distance Ambulated (ft) 450 ft  Activity Response Tolerated well  Mobility Referral Yes  $Mobility charge 1 Mobility  Mobility Specialist Start Time (ACUTE ONLY) 1100  Mobility Specialist Stop Time (ACUTE ONLY) 1108  Mobility Specialist Time Calculation (min) (ACUTE ONLY) 8 min   Pt received ambulating independently in room, agreeable to mobility session. Ambulated in hallway with CGA with gait belt. Tolerated well, asx throughout. Returned pt to room all needs met.   Feliciana Rossetti Mobility Specialist Please contact via Special educational needs teacher or  Rehab office at [phone]

## 2023-10-11 NOTE — Plan of Care (Signed)
  Problem: Education: Goal: Knowledge of General Education information will improve Description: Including pain rating scale, medication(s)/side effects and non-pharmacologic comfort measures Outcome: Progressing   Problem: Activity: Goal: Risk for activity intolerance will decrease Outcome: Progressing   Problem: Coping: Goal: Level of anxiety will decrease Outcome: Progressing   

## 2023-10-11 NOTE — Plan of Care (Signed)
Pt educated regarding cardiac procedure. Pt voices understanding.

## 2023-10-11 NOTE — Progress Notes (Signed)
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  He was found to be anemic requiring 1 unit PRBC transfusion but also due to concerns of NSTEMI patient was transferred to [facility] for cardiology consultation where he received additional units of PRBC and treatment for NSTEMI.  Eventually cardiology decided to transfer patient to [facility] on 10/27 for cardiac catheterization which showed moderate left main stenosis, severe RCA disease.  Cardiology is not planning on stenting left main and performing TAVR.   Assessment & Plan:  Principal Problem:   Acute blood loss anemia Active Problems:   Coronary artery disease   Acute on chronic diastolic CHF (congestive heart failure) (HCC)   Essential hypertension   Lesion of right native kidney   Dyslipidemia, goal LDL below 70   Nicotine dependence, cigarettes, uncomplicated   Acute blood loss anemia secondary to hematuria BPH with chronic indwelling Foley catheter At this time plan is to continue Foley catheter, monitor for any more hematuria.  PRBC transfusion as necessary.  Eventually outpatient follow-up.   Symptomatic severe aortic stenosis Coronary artery disease/NSTEMI -Initially patient suffered from NSTEMI which was medically managed but eventually after performing left heart catheterization 10/21 it was determined to likely proceed with stenting to left main and performing TAVR.  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   DVT prophylaxis: SCDs Start: 10/03/23 0805 Code Status: Full code Family Communication:   Status is: Inpatient Remains inpatient appropriate because: Continue hospital stay until cleared by cardiology    Subjective: Seen and examined at bedside, no complaints   Examination:  General exam: Appears calm and comfortable  Respiratory system: Clear to auscultation. Respiratory effort normal. Cardiovascular system: S1 & S2 heard, RRR. No JVD, positive systolic murmurs, rubs, gallops or clicks. No pedal edema. Gastrointestinal system: Abdomen is nondistended, soft and nontender. No organomegaly or masses felt. Normal bowel sounds heard. Central nervous system: Alert and oriented. No focal neurological deficits. Extremities: Symmetric 5 x 5 power. Skin: No rashes, lesions or ulcers Psychiatry: Judgement and insight appear normal. Mood & affect appropriate.      Diet Orders (From admission, onward)     Start     Ordered   10/09/23 1145  Diet Heart Room service appropriate? Yes; Fluid consistency: Thin  Diet effective now       Question Answer Comment  Room service appropriate? Yes   Fluid consistency: Thin      10/09/23 1144            Objective: Vitals:   10/10/23 1935 10/10/23 2358 10/11/23 0513 10/11/23 0725  BP: 123/73 (!) 121/59 119/78 (!) 120/51  Pulse: 70 69 66 65  Resp: 19 18 18 18   Temp: 98.7 F (37.1 C) 98.6 F (37 C) 98.7 F (37.1 C) 98.4 F (36.9 C)  TempSrc: Oral Oral Oral Oral  SpO2:  97% 96% 96% 95%  Weight:   66.5 kg   Height:        Intake/Output Summary (Last 24 hours) at 10/11/2023 0754 Last data filed at 10/11/2023 0518 Gross per 24 hour  Intake 1500 ml  Output 2800 ml  Net -1300 ml   Filed Weights   10/09/23 0340 10/10/23 0422  10/11/23 0513  Weight: 67.3 kg 66.8 kg 66.5 kg    Scheduled Meds:  amLODipine  5 mg Oral Daily   carvedilol  3.125 mg Oral BID WC   Chlorhexidine Gluconate Cloth  6 each Topical Daily   finasteride  5 mg Oral QPM   rosuvastatin  40 mg Oral QPM   senna-docusate  2 tablet Oral QHS   sodium chloride flush  3 mL Intravenous Q12H   Continuous Infusions:  Nutritional status     Body mass index is 25.99 kg/m.  Data Reviewed:   CBC: Recent Labs  Lab 10/07/23 0403 10/08/23 0231 10/09/23 1038 10/09/23 1040 10/09/23 1042 10/09/23 1145 10/10/23 1130 10/11/23 0256  WBC 8.7 9.0  --   --   --  11.6* 9.9 8.8  HGB 8.9* 8.8*   < > 9.9* 10.2* 10.6* 10.0* 9.6*  HCT 28.8* 27.3*   < > 29.0* 30.0* 33.1* 30.7* 29.2*  MCV 92.0 85.6  --   --   --  87.6 87.2 87.2  PLT 172 181  --   --   --  223 218 207   < > = values in this interval not displayed.   Basic Metabolic Panel: Recent Labs  Lab 10/06/23 0354 10/07/23 0403 10/08/23 0231 10/09/23 0231 10/09/23 1038 10/09/23 1040 10/09/23 1042 10/10/23 0918 10/11/23 0256  NA 135 136 135 137 138 137 136 138 134*  K 3.9 3.8 4.0 4.3 3.5 3.5 3.6 4.2 3.8  CL 103 103 102 102  --   --   --  104 103  CO2 25 27 28 28   --   --   --  27 25  GLUCOSE 92 90 99 100*  --   --   --  104* 87  BUN 26* 20 17 16   --   --   --  14 14  CREATININE 0.65 0.66 0.72 0.76  --   --   --  0.68 0.65  CALCIUM 7.8* 8.2* 8.2* 8.8*  --   --   --  8.6* 8.1*  MG 2.5* 2.3 2.0  --   --   --   --   --   --    GFR: Estimated Creatinine Clearance: 64.2 mL/min (by C-G formula based on SCr of 0.65 mg/dL). Liver Function Tests: Recent Labs  Lab 10/05/23 0253 10/06/23 0354 10/07/23 0403 10/08/23 0231  AST 19 30 24 17   ALT 16 26 32 27  ALKPHOS 39 39 39 41  BILITOT 0.3 0.4 0.7 0.4  PROT 5.3* 5.3* 5.3* 4.9*  ALBUMIN 2.8* 2.7* 2.6* 2.3*   No results for input(s): "LIPASE", "AMYLASE" in the last 168 hours. No results for input(s): "AMMONIA" in the last 168  hours. Coagulation Profile: Recent Labs  Lab 10/05/23 0253  INR 1.1   Cardiac Enzymes: No results for input(s): "CKTOTAL", "CKMB", "CKMBINDEX", "TROPONINI" in the last 168 hours. BNP (last 3 results) No results for input(s): "PROBNP" in the last 8760 hours. HbA1C: No results for input(s): "HGBA1C" in the last 72 hours. CBG: No results for input(s): "GLUCAP" in the last 168 hours. Lipid Profile: No results for input(s): "  CHOL", "HDL", "LDLCALC", "TRIG", "CHOLHDL", "LDLDIRECT" in the last 72 hours. Thyroid Function Tests: No results for input(s): "TSH", "T4TOTAL", "FREET4", "T3FREE", "THYROIDAB" in the last 72 hours. Anemia Panel: Recent Labs    10/09/23 1814  FERRITIN 101  TIBC 419  IRON 33*   Sepsis Labs: No results for input(s): "PROCALCITON", "LATICACIDVEN" in the last 168 hours.  Recent Results (from the past 240 hour(s))  MRSA Next Gen by PCR, Nasal     Status: None   Collection Time: 10/03/23  6:50 PM   Specimen: Nasal Mucosa; Nasal Swab  Result Value Ref Range Status   MRSA by PCR Next Gen NOT DETECTED NOT DETECTED Final    Comment: (NOTE) The GeneXpert MRSA Assay (FDA approved for NASAL specimens only), is one component of a comprehensive MRSA colonization surveillance program. It is not intended to diagnose MRSA infection nor to guide or monitor treatment for MRSA infections. Test performance is not FDA approved in patients less than 23 years old. Performed at [facility], [address] [place], [place] [address]   Resp panel by RT-PCR (RSV, Flu A&B, Covid)     Status: None   Collection Time: 10/04/23 11:28 AM  Result Value Ref Range Status   SARS Coronavirus 2 by RT PCR NEGATIVE NEGATIVE Final    Comment: (NOTE) SARS-CoV-2 target nucleic acids are NOT DETECTED.  The SARS-CoV-2 RNA is generally detectable in upper respiratory specimens during the acute phase of infection. The lowest concentration of SARS-CoV-2 viral copies this assay  can detect is 138 copies/mL. A negative result does not preclude SARS-Cov-2 infection and should not be used as the sole basis for treatment or other patient management decisions. A negative result may occur with  improper specimen collection/handling, submission of specimen other than nasopharyngeal swab, presence of viral mutation(s) within the areas targeted by this assay, and inadequate number of viral copies(<138 copies/mL). A negative result must be combined with clinical observations, patient history, and epidemiological information. The expected result is Negative.  Fact Sheet for Patients:  BloggerCourse.com  Fact Sheet for Healthcare Providers:  SeriousBroker.it  This test is no t yet approved or cleared by the [place] FDA and  has been authorized for detection and/or diagnosis of SARS-CoV-2 by FDA under an Emergency Use Authorization (EUA). This EUA will remain  in effect (meaning this test can be used) for the duration of the COVID-19 declaration under Section 564(b)(1) of the Act, 21 U.S.C.section 360bbb-3(b)(1), unless the authorization is terminated  or revoked sooner.       Influenza A by PCR NEGATIVE NEGATIVE Final   Influenza B by PCR NEGATIVE NEGATIVE Final    Comment: (NOTE) The Xpert Xpress SARS-CoV-2/FLU/RSV plus assay is intended as an aid in the diagnosis of influenza from Nasopharyngeal swab specimens and should not be used as a sole basis for treatment. Nasal washings and aspirates are unacceptable for Xpert Xpress SARS-CoV-2/FLU/RSV testing.  Fact Sheet for Patients: BloggerCourse.com  Fact Sheet for Healthcare Providers: SeriousBroker.it  This test is not yet approved or cleared by the [place] FDA and has been authorized for detection and/or diagnosis of SARS-CoV-2 by FDA under an Emergency Use Authorization (EUA). This EUA will  remain in effect (meaning this test can be used) for the duration of the COVID-19 declaration under Section 564(b)(1) of the Act, 21 U.S.C. section 360bbb-3(b)(1), unless the authorization is terminated or revoked.     Resp Syncytial Virus by PCR NEGATIVE NEGATIVE Final    Comment: (NOTE)  Fact Sheet for Patients: BloggerCourse.com  Fact Sheet for Healthcare Providers: SeriousBroker.it  This test is not yet approved or cleared by the [place] FDA and has been authorized for detection and/or diagnosis of SARS-CoV-2 by FDA under an Emergency Use Authorization (EUA). This EUA will remain in effect (meaning this test can be used) for the duration of the COVID-19 declaration under Section 564(b)(1) of the Act, 21 U.S.C. section 360bbb-3(b)(1), unless the authorization is terminated or revoked.  Performed at [facility], [address], [place], [place] [address]          Radiology Studies: CT CORONARY MORPH W/CTA COR W/SCORE W/CA W/CM &/OR WO/CM  Addendum Date: 10/10/2023   ADDENDUM REPORT: 10/10/2023 16:14 EXAM: OVER-READ INTERPRETATION  CT CHEST The following report is an over-read performed by radiologist Dr. Jacob Moores [facility] on 10/10/2023. This over-read does not include interpretation of cardiac or coronary anatomy or pathology. The cardiac TAVR interpretation by the cardiologist is attached. COMPARISON:  None. FINDINGS: Extracardiac findings will be described separately under dictation for contemporaneously obtained CTA chest, abdomen and pelvis. IMPRESSION: Please see separate dictation for contemporaneously obtained CTA chest, abdomen and pelvis dated 10/10/2023 for full description of relevant extracardiac findings. Electronically Signed   By: Allegra Lai M.D.   On: 10/10/2023 16:14   Result Date: 10/10/2023 CLINICAL DATA:  67M with severe aortic stenosis being evaluated  for a TAVR procedure. EXAM: Cardiac TAVR CT TECHNIQUE: The patient was scanned on a [employer]. A 120 kV retrospective scan was triggered in the descending thoracic aorta at 111 HU's. Gantry rotation speed was 250 msecs and collimation was .6 mm. No beta blockade or nitro were given. The 3D data set was reconstructed in 5% intervals of the R-R cycle. Systolic and diastolic phases were analyzed on a dedicated work station using MPR, MIP and VRT modes. The patient received 100 cc of contrast. FINDINGS: Aortic Root: Aortic valve: Trileaflet Aortic valve calcium score: 3834 Aortic annulus: Diameter: 29mm x 21mm Perimeter: 78mm Area: 454 mm^2 Calcifications: Mild calcification adjacent to LCC Coronary height: Min Left - 16mm; Min Right - 24mm Sinotubular height: Left cusp - 26mm; Right cusp - 28mm; Noncoronary cusp - 25mm LVOT (as measured 3 mm below the annulus): Diameter: 29mm x 21mm Area: 440 mm^2 Calcifications: No calcifications Aortic sinus width: Left cusp - 32mm; Right cusp - 32mm; Noncoronary cusp - 34mm Sinotubular junction width: 29mm x 27mm Optimum Fluoroscopic Angle for Delivery: RAO 1 CAU 11 Cardiac: Right atrium: Mild enlargement Right ventricle: Normal size Pulmonary arteries: Normal size Pulmonary veins: Normal configuration Left atrium: Normal size Left ventricle: Normal size Pericardium: Normal thickness Coronary arteries: Calcium score 2379 (91st percentile) IMPRESSION: 1. Trileaflet aortic valve with severe calcifications (AV calcium score 3834) 2. Aortic annulus measures 29mm x 21mm in diameter with perimeter 78mm and area 454 mm^2. Mild annular calcifications adjacent to left coronary cusp. Annular measurements are suitable for placement of a 26mm Edwards Sapien 3 valve. 3.  Sufficient coronary to annulus distance. 4.  Optimum Fluoroscopic Angle for Delivery:  RAO 1 CAU 11 5.  Coronary calcium score 2379 (91st percentile) Electronically Signed: By: Epifanio Lesches M.D. On:  10/10/2023 15:38   CT ANGIO CHEST AORTA W/CM & OR WO/CM  Result Date: 10/10/2023 CLINICAL DATA:  Aortic valve replacement preop evaluation. EXAM: CT ANGIOGRAPHY CHEST, ABDOMEN AND PELVIS TECHNIQUE: Non-contrast CT of the chest was initially obtained. Multidetector CT imaging through the chest, abdomen and pelvis was  performed using the standard protocol during bolus administration of intravenous contrast. Multiplanar reconstructed images and MIPs were obtained and reviewed to evaluate the vascular anatomy. RADIATION DOSE REDUCTION: This exam was performed according to the departmental dose-optimization program which includes automated exposure control, adjustment of the mA and/or kV according to patient size and/or use of iterative reconstruction technique. CONTRAST:  95mL OMNIPAQUE IOHEXOL 350 MG/ML SOLN COMPARISON:  Chest CTA dated October 03, 2023; CT abdomen and pelvis dated October 03, 2023 FINDINGS: CTA CHEST FINDINGS Cardiovascular: Normal heart size. No pericardial effusion. Aortic valve thickening and calcifications. Normal caliber thoracic aorta with severe atherosclerotic disease. Severe left main and three-vessel coronary artery calcifications. Mitral annular calcifications. Mediastinum/Nodes: Esophagus and thyroid are unremarkable. No enlarged lymph nodes seen in the chest. Calcified mediastinal and right hilar lymph nodes which are likely sequela of prior granulomatous infection. Lungs/Pleura: Central airways are patent. Mild central predominant ground-glass opacities. Small bilateral pleural effusions with adjacent atelectasis. Calcified right upper lobe nodule. Musculoskeletal: No chest wall abnormality. No acute or significant osseous findings. CTA ABDOMEN AND PELVIS FINDINGS Hepatobiliary: Unchanged simple appearing cyst of the right hepatic lobe. No suspicious liver lesions. Gallbladder is completely filled with hyperdense material, likely combination of sludge and stones. No biliary ductal  dilation. Pancreas: Cystic lesion of the uncinate process of the pancreas measuring 12 mm on series 9, image 79, unchanged. No pancreatic ductal dilatation or surrounding inflammatory changes. Spleen: Normal in size without focal abnormality. Adrenals/Urinary Tract: Bilateral adrenal glands are unremarkable. No hydronephrosis or nephrolithiasis. Unchanged bilateral cystic renal lesions, including large cyst of the upper pole of the right kidney with mural calcification and hyperdense lesion of the interpolar left kidney measuring 18 mm on series 6, image 432. Thick-walled urinary bladder which is decompressed with a Foley catheter. Stomach/Bowel: Stomach is within normal limits. Appendix appears normal. Severe sigmoid diverticulosis. No evidence of bowel wall thickening, distention, or inflammatory changes. Vascular/lymphatic: Normal caliber abdominal aorta moderate to severe atherosclerotic disease. No enlarged lymph nodes seen in the chest. Reproductive: Prostatomegaly, measuring up to 5.5 cm. Other: No abdominal wall hernia or abnormality. No abdominopelvic ascites. Musculoskeletal: No acute or significant osseous findings. VASCULAR MEASUREMENTS PERTINENT TO TAVR: AORTA: Minimal Aortic Diameter-16.4 mm Severity of Aortic Calcification-severe RIGHT PELVIS: Right Common Iliac Artery - Minimal Diameter-10.1 mm Tortuosity-mild Calcification-moderate Right External Iliac Artery - Minimal Diameter - 9.0 mm Tortuosity-moderate Calcification-none Right Common Femoral Artery - Minimal Diameter - 6.6 mm Tortuosity-mild Calcification-moderate LEFT PELVIS: Left Common Iliac Artery - Minimal Diameter-8.1 mm Tortuosity-mild Calcification-moderate Left External Iliac Artery - Minimal Diameter-9.0 mm Tortuosity-moderate Calcification-none Left Common Femoral Artery - Minimal Diameter-7.2 mm Tortuosity-mild Calcification-moderate Review of the MIP images confirms the above findings. IMPRESSION: Vascular: 1. Vascular findings and  measurements pertinent to potential TAVR procedure, as detailed above. 2. Thickening and calcification of the aortic valve, compatible with reported clinical history of aortic stenosis. 3. Moderate to severe aortoiliac atherosclerosis. Left main and 3 vessel coronary artery disease. Nonvascular: 1. Mild central predominant ground-glass opacities, likely due to pulmonary edema. 2. Small bilateral pleural effusions with adjacent atelectasis. 3. Redemonstration of high attenuation lesion of the interpolar left kidney and exophytic cyst of the upper pole of the right kidney with mural calcification. Per prior report, recommend contrast-enhanced abdominal MRI for further evaluation. 4. 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 Recommend attention recommended MRI. 5. Thick-walled urinary bladder and prostatomegaly, likely due to chronic outlet obstruction. Electronically Signed  By: Allegra Lai M.D.   On: 10/10/2023 16:11   CT Angio Abd/Pel w/ and/or w/o  Result Date: 10/10/2023 CLINICAL DATA:  Aortic valve replacement preop evaluation. EXAM: CT ANGIOGRAPHY CHEST, ABDOMEN AND PELVIS TECHNIQUE: Non-contrast CT of the chest was initially obtained. Multidetector CT imaging through the chest, abdomen and pelvis was performed using the standard protocol during bolus administration of intravenous contrast. Multiplanar reconstructed images and MIPs were obtained and reviewed to evaluate the vascular anatomy. RADIATION DOSE REDUCTION: This exam was performed according to the departmental dose-optimization program which includes automated exposure control, adjustment of the mA and/or kV according to patient size and/or use of iterative reconstruction technique. CONTRAST:  95mL OMNIPAQUE IOHEXOL 350 MG/ML SOLN COMPARISON:  Chest CTA dated October 03, 2023; CT abdomen and pelvis dated October 03, 2023 FINDINGS: CTA CHEST FINDINGS Cardiovascular: Normal  heart size. No pericardial effusion. Aortic valve thickening and calcifications. Normal caliber thoracic aorta with severe atherosclerotic disease. Severe left main and three-vessel coronary artery calcifications. Mitral annular calcifications. Mediastinum/Nodes: Esophagus and thyroid are unremarkable. No enlarged lymph nodes seen in the chest. Calcified mediastinal and right hilar lymph nodes which are likely sequela of prior granulomatous infection. Lungs/Pleura: Central airways are patent. Mild central predominant ground-glass opacities. Small bilateral pleural effusions with adjacent atelectasis. Calcified right upper lobe nodule. Musculoskeletal: No chest wall abnormality. No acute or significant osseous findings. CTA ABDOMEN AND PELVIS FINDINGS Hepatobiliary: Unchanged simple appearing cyst of the right hepatic lobe. No suspicious liver lesions. Gallbladder is completely filled with hyperdense material, likely combination of sludge and stones. No biliary ductal dilation. Pancreas: Cystic lesion of the uncinate process of the pancreas measuring 12 mm on series 9, image 79, unchanged. No pancreatic ductal dilatation or surrounding inflammatory changes. Spleen: Normal in size without focal abnormality. Adrenals/Urinary Tract: Bilateral adrenal glands are unremarkable. No hydronephrosis or nephrolithiasis. Unchanged bilateral cystic renal lesions, including large cyst of the upper pole of the right kidney with mural calcification and hyperdense lesion of the interpolar left kidney measuring 18 mm on series 6, image 432. Thick-walled urinary bladder which is decompressed with a Foley catheter. Stomach/Bowel: Stomach is within normal limits. Appendix appears normal. Severe sigmoid diverticulosis. No evidence of bowel wall thickening, distention, or inflammatory changes. Vascular/lymphatic: Normal caliber abdominal aorta moderate to severe atherosclerotic disease. No enlarged lymph nodes seen in the chest.  Reproductive: Prostatomegaly, measuring up to 5.5 cm. Other: No abdominal wall hernia or abnormality. No abdominopelvic ascites. Musculoskeletal: No acute or significant osseous findings. VASCULAR MEASUREMENTS PERTINENT TO TAVR: AORTA: Minimal Aortic Diameter-16.4 mm Severity of Aortic Calcification-severe RIGHT PELVIS: Right Common Iliac Artery - Minimal Diameter-10.1 mm Tortuosity-mild Calcification-moderate Right External Iliac Artery - Minimal Diameter - 9.0 mm Tortuosity-moderate Calcification-none Right Common Femoral Artery - Minimal Diameter - 6.6 mm Tortuosity-mild Calcification-moderate LEFT PELVIS: Left Common Iliac Artery - Minimal Diameter-8.1 mm Tortuosity-mild Calcification-moderate Left External Iliac Artery - Minimal Diameter-9.0 mm Tortuosity-moderate Calcification-none Left Common Femoral Artery - Minimal Diameter-7.2 mm Tortuosity-mild Calcification-moderate Review of the MIP images confirms the above findings. IMPRESSION: Vascular: 1. Vascular findings and measurements pertinent to potential TAVR procedure, as detailed above. 2. Thickening and calcification of the aortic valve, compatible with reported clinical history of aortic stenosis. 3. Moderate to severe aortoiliac atherosclerosis. Left main and 3 vessel coronary artery disease. Nonvascular: 1. Mild central predominant ground-glass opacities, likely due to pulmonary edema. 2. Small bilateral pleural effusions with adjacent atelectasis. 3. Redemonstration of high attenuation lesion of the interpolar left kidney and exophytic cyst of the  upper pole of the right kidney with mural calcification. Per prior report, recommend contrast-enhanced abdominal MRI for further evaluation. 4. 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 Recommend attention recommended MRI. 5. Thick-walled urinary bladder and prostatomegaly, likely due to chronic outlet obstruction. Electronically Signed   By:  Allegra Lai M.D.   On: 10/10/2023 16:11   CARDIAC CATHETERIZATION  Result Date: 10/09/2023   Ost LM lesion is 50% stenosed. 1.  Moderate left main stenosis with an RFR of 0.83. 2.  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 3.  Fick cardiac output of 6.5 L/min and Fick cardiac index of 3.8 L/min/m with the following hemodynamics:  Right atrial pressure mean of 7  Right ventricular pressure 42/-2 with an end-diastolic pressure of 8 mmHg  Wedge pressure mean of 14 mmHg with V waves to 23 mmHg  PA pressure of 38/16 with a mean of 24 mmHg  PVR of 1.5 Woods units  PA pulsatility index of 3.1 Recommendation: Continue evaluation for aortic valve intervention.  Will obtain TAVR protocol CTA tomorrow.           LOS: 8 days   Time spent= 35 mins    Miguel Rota, MD [facility]  If 7PM-7AM, please contact night-coverage  10/11/2023, 7:54 AM

## 2023-10-11 NOTE — Plan of Care (Signed)
Pt educated regarding tests and results. Pt states understanding

## 2023-10-12 ENCOUNTER — Other Ambulatory Visit (HOSPITAL_COMMUNITY): Payer: Self-pay

## 2023-10-12 DIAGNOSIS — I25110 Atherosclerotic heart disease of native coronary artery with unstable angina pectoris: Secondary | ICD-10-CM | POA: Diagnosis not present

## 2023-10-12 DIAGNOSIS — D62 Acute posthemorrhagic anemia: Secondary | ICD-10-CM | POA: Diagnosis not present

## 2023-10-12 DIAGNOSIS — I5033 Acute on chronic diastolic (congestive) heart failure: Secondary | ICD-10-CM | POA: Diagnosis not present

## 2023-10-12 DIAGNOSIS — I214 Non-ST elevation (NSTEMI) myocardial infarction: Secondary | ICD-10-CM | POA: Diagnosis not present

## 2023-10-12 DIAGNOSIS — I359 Nonrheumatic aortic valve disorder, unspecified: Secondary | ICD-10-CM | POA: Diagnosis not present

## 2023-10-12 LAB — CBC
HCT: 30.6 % — ABNORMAL LOW (ref 39.0–52.0)
Hemoglobin: 9.5 g/dL — ABNORMAL LOW (ref 13.0–17.0)
MCH: 27.3 pg (ref 26.0–34.0)
MCHC: 31 g/dL (ref 30.0–36.0)
MCV: 87.9 fL (ref 80.0–100.0)
Platelets: 197 10*3/uL (ref 150–400)
RBC: 3.48 MIL/uL — ABNORMAL LOW (ref 4.22–5.81)
RDW: 15.2 % (ref 11.5–15.5)
WBC: 7 10*3/uL (ref 4.0–10.5)
nRBC: 0 % (ref 0.0–0.2)

## 2023-10-12 LAB — URINALYSIS, COMPLETE (UACMP) WITH MICROSCOPIC
Bilirubin Urine: NEGATIVE
Glucose, UA: NEGATIVE mg/dL
Ketones, ur: NEGATIVE mg/dL
Nitrite: POSITIVE — AB
Protein, ur: 100 mg/dL — AB
RBC / HPF: >50 RBC/hpf (ref 0–5)
Specific Gravity, Urine: 1.015 (ref 1.005–1.030)
WBC, UA: >50 WBC/hpf (ref 0–5)
pH: 8 (ref 5.0–8.0)

## 2023-10-12 LAB — BASIC METABOLIC PANEL
Anion gap: 7 (ref 5–15)
BUN: 13 mg/dL (ref 8–23)
CO2: 24 mmol/L (ref 22–32)
Calcium: 8.3 mg/dL — ABNORMAL LOW (ref 8.9–10.3)
Chloride: 106 mmol/L (ref 98–111)
Creatinine, Ser: 0.7 mg/dL (ref 0.61–1.24)
GFR, Estimated: >60 mL/min (ref >60)
Glucose, Bld: 81 mg/dL (ref 70–99)
Potassium: 3.8 mmol/L (ref 3.5–5.1)
Sodium: 137 mmol/L (ref 135–145)

## 2023-10-12 LAB — MAGNESIUM: Magnesium: 2.1 mg/dL (ref 1.7–2.4)

## 2023-10-12 LAB — PHOSPHORUS: Phosphorus: 4.1 mg/dL (ref 2.5–4.6)

## 2023-10-12 LAB — LIPOPROTEIN A (LPA): Lipoprotein (a): 120.3 nmol/L — ABNORMAL HIGH (ref <75.0)

## 2023-10-12 MED ORDER — AMLODIPINE BESYLATE 5 MG PO TABS
5.0000 mg | ORAL_TABLET | Freq: Every day | ORAL | 0 refills | Status: DC
  Filled 2023-10-12: qty 30, 30d supply, fill #0

## 2023-10-12 MED ORDER — CEPHALEXIN 500 MG PO CAPS
500.0000 mg | ORAL_CAPSULE | Freq: Three times a day (TID) | ORAL | 0 refills | Status: DC
  Filled 2023-10-12: qty 21, 7d supply, fill #0

## 2023-10-12 MED ORDER — CLOPIDOGREL BISULFATE 75 MG PO TABS
75.0000 mg | ORAL_TABLET | Freq: Every day | ORAL | 0 refills | Status: DC
  Filled 2023-10-12: qty 30, 30d supply, fill #0

## 2023-10-12 MED ORDER — ASPIRIN 81 MG PO TBEC
81.0000 mg | DELAYED_RELEASE_TABLET | Freq: Every day | ORAL | 0 refills | Status: AC
  Filled 2023-10-12: qty 30, 30d supply, fill #0

## 2023-10-12 NOTE — Evaluation (Signed)
Physical Therapy Brief Evaluation and Discharge Note Patient Details Name: Kyle Matthews MRN: [id number] DOB: 1946-07-21 Today's Date: 10/12/2023   History of Present Illness  77 year old admitted 10/15 presented to [facility] with complaints of chest pain and shortness of breath.  Positive for anemia with transfusions  and treatment for NSTEMI after transfer to [facility].  Eventually cardiology decided to transfer patient to [facility] on 10/27 for cardiac catheterization which showed moderate left main stenosis, severe RCA disease.  Cardiology is not planning on stenting left main and performing TAVR in a few weeks.  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  Pt currently without significant functional limitations scoring 23/24 on DGI.  Does not have equipment or f/u needs.  Will sign off.   5 Meter Walk Test:  Trial 1 7.80 seconds  Trial 2 7.18 seconds  Trial 3 7.72 seconds  3 Trial Average/Gait Speed 7.56 seconds/2.16 ft/sec (<1.8 ft/sec indicates high fall risk)      PT Assessment Patient does not need any further PT services  Assistance Needed at Discharge  None    Equipment Recommendations None recommended by PT  Recommendations for Other Services       Precautions/Restrictions Precautions Precautions: None Restrictions Weight Bearing Restrictions: No        Mobility  Bed Mobility Rolling: Independent        Transfers Overall transfer level: Independent                      Ambulation/Gait Ambulation/Gait assistance: Independent Gait Distance (Feet): 450 Feet Assistive device: None Gait Pattern/deviations: WFL(Within Functional Limits) Gait Speed: Pace WFL    Home Activity Instructions    Stairs            Modified Rankin (Stroke Patients Only)        Balance                  Standardized Balance Assessment Standardized Balance Assessment : Dynamic Gait Index Dynamic Gait Index Level Surface: Normal Change in Gait Speed: Normal Gait with Horizontal Head Turns: Normal Gait with Vertical Head Turns: Normal Gait and Pivot Turn: Normal Step Over Obstacle: Normal Step Around Obstacles: Normal Steps: Mild Impairment Total Score: 23      Pertinent Vitals/Pain PT - Brief Vital Signs All Vital Signs Stable: Yes (O2 98% RA with activity) Pain Assessment Pain Assessment: No/denies pain     Home Living Family/patient expects to be discharged to:: Private residence Living Arrangements: Spouse/significant other;Children Available Help at Discharge: Family;Available 24 hours/day Home Environment: Stairs to enter  [employer] of Steps: 1+2+1 Home Equipment: None        Prior Function Level of Independence: Independent Comments: retired, drives    UE/LE Assessment        LE ROM/Strength/Tone/Coordination: [employer]      Communication   Communication Communication: No apparent difficulties     Cognition Overall Cognitive Status: Appears within functional limits for tasks assessed/performed       General Comments General comments (skin integrity, edema, etc.): VSS    Exercises     Assessment/Plan    PT Problem List         PT Visit Diagnosis Muscle weakness (generalized) (M62.81)    No Skilled PT All education completed;Patient at baseline level of functioning;Patient is independent with all acitivity/mobility   Co-evaluation  AMPAC 6 Clicks Help needed turning from your back to your side while in a flat bed without using bedrails?: None Help needed moving from lying on your back to sitting on the side of a flat bed without using bedrails?: None Help needed moving to and from a bed to a chair (including a wheelchair)?: None Help needed standing up from a chair using your arms (e.g., wheelchair or bedside chair)?:  None Help needed to walk in hospital room?: None Help needed climbing 3-5 steps with a railing? : None 6 Click Score: 24      End of Session Equipment Utilized During Treatment: Gait belt Activity Tolerance: Patient tolerated treatment well Patient left: in bed;with call bell/phone within reach;with family/visitor present Nurse Communication: Mobility status PT Visit Diagnosis: Muscle weakness (generalized) (M62.81)     Time: 1137-1207 PT Time Calculation (min) (ACUTE ONLY): 30 min  Charges:   PT Evaluation $PT Eval Low Complexity: 1 Low PT Treatments $Gait Training: 8-22 mins    [facility] M,PT Acute Rehab Services [phone]   Bevelyn Buckles  10/12/2023, 1:24 PM

## 2023-10-12 NOTE — Plan of Care (Signed)

## 2023-10-12 NOTE — Progress Notes (Signed)
Pt AVS instructions given, IV removed and tele removed, Chronic Foley CM to set up Home health fro foley management. TOC meds to pick up medications on the way out.

## 2023-10-12 NOTE — TOC Transition Note (Addendum)
Transition of Care [facility]) - CM/SW Discharge Note   Patient Details  Name: Kyle Matthews MRN: [id number] Date of Birth: [birth year]  [facility] CM/SW Contact:  Leone Haven, RN Phone Number: 10/12/2023, 11:31 AM   Clinical Narrative:    Patient is for dc today, he will be deciding between SAVR and TAVR per Cards note, he has no needs. He has transport at dc. Order for PT and OT eval to see prior to dc. Await recs. Per pt eval patient is good to go with no f/u , and he does not need any OT f/u. He has chronic foley will need HHRN .  NCM offered choice, he has no preference.  NCM made referral to [facility] with [facility] for [facility], awaiting to hear back. NCM also made referral to [facility] with [facility].  NCM made referral to [facility] with [facility] she is able to take referral.  Soc will need cath exchange on 11/5 for cudae 16 inch french.       Barriers to Discharge: Continued Medical Work up   Patient Goals and CMS Choice   Choice offered to / list presented to : NA  Discharge Placement                         Discharge Plan and Services Additional resources added to the After Visit Summary for   In-house Referral: NA Discharge Planning Services: CM Consult Post Acute Care Choice: NA          DME Arranged: N/A DME Agency: NA       HH Arranged: NA          Social Determinants of Health (SDOH) Interventions SDOH Screenings   Food Insecurity: No Food Insecurity (10/04/2023)  Housing: Low Risk  (10/04/2023)  Transportation Needs: No Transportation Needs (10/04/2023)  Utilities: Not At Risk (10/04/2023)  Tobacco Use: High Risk (10/03/2023)     Readmission Risk Interventions     No data to display

## 2023-10-12 NOTE — Discharge Summary (Signed)
Electronically Signed   By: Carey Bullocks M.D.   On: 10/05/2023 12:44   ECHOCARDIOGRAM COMPLETE  Result Date: 10/03/2023    ECHOCARDIOGRAM REPORT   Patient Name:   Kyle Matthews Date of Exam: 10/03/2023 Medical Rec #:  [id number]  Height:       68.0 in Accession #:    [id number] Weight:       152.6 lb Date of Birth:  12-05-1946   BSA:          1.822 m Patient Age:    77 years   BP:           111/68 mmHg Patient Gender: M          HR:           94 bpm. Ex[facility] Procedure: 2D Echo, Cardiac Doppler and Color Doppler Indications:    SBE  History:        Patient has prior history of Echocardiogram examinations, most                 recent 05/02/2018. Acute MI, Arrythmias:RBBB,                 Signs/Symptoms:Chest Pain and Shortness of Breath; Risk                 Factors:Hypertension, Dyslipidemia and Current Smoker.  Sonographer:    Mikki Harbor Referring Phys: [id number] SHERI HAMMOCK IMPRESSIONS  1. Left ventricular ejection fraction, by estimation, is 55 to 60%. The left ventricle has normal function. The left ventricle has no regional wall motion abnormalities. There is mild left ventricular hypertrophy. Left ventricular diastolic parameters are indeterminate. Elevated left atrial pressure.  2. Right ventricular systolic function is normal. The right ventricular size is normal. There is mildly elevated pulmonary artery systolic pressure.  3. Left atrial size was moderately dilated.  4. Right atrial size was moderately dilated.  5. The mitral valve is abnormal. Mild to moderate mitral valve regurgitation. No evidence of mitral stenosis.  6. The tricuspid valve is abnormal.  7. The aortic valve was not well visualized. There is severe calcifcation of the aortic valve. There is severe thickening of the aortic valve. Aortic valve regurgitation is moderate to severe. Severe aortic valve stenosis. Severe aortic stenosis is present. Aortic valve mean  gradient measures 42.0 mmHg. Aortic valve peak gradient measures 69.2 mmHg. Aortic valve area, by VTI measures 0.89 cm.  8. The inferior vena cava is dilated in size with >50% respiratory variability, suggesting right atrial pressure of 8 mmHg. FINDINGS  Left Ventricle: Left ventricular ejection fraction, by estimation, is 55 to 60%. The left ventricle has normal function. The left ventricle has no regional wall motion abnormalities. The left ventricular internal cavity size was normal in size. There is  mild left ventricular hypertrophy. Left ventricular diastolic parameters are indeterminate. Elevated left atrial pressure. Right Ventricle: The right ventricular size is normal. Right vetricular wall thickness was not well visualized. Right ventricular systolic function is normal. There is mildly elevated pulmonary artery systolic pressure. The tricuspid regurgitant velocity  is 3.02 m/s, and with an assumed right atrial pressure of 8 mmHg, the estimated right ventricular systolic pressure is 44.5 mmHg. Left Atrium: Left atrial size was moderately dilated. Right Atrium: Right atrial size was moderately dilated. Pericardium: There is no evidence of pericardial effusion. Mitral Valve: The mitral valve is abnormal. Mild mitral annular calcification. Mild to moderate mitral valve regurgitation. No evidence of mitral valve stenosis. MV  Electronically Signed   By: Carey Bullocks M.D.   On: 10/05/2023 12:44   ECHOCARDIOGRAM COMPLETE  Result Date: 10/03/2023    ECHOCARDIOGRAM REPORT   Patient Name:   Kyle Matthews Date of Exam: 10/03/2023 Medical Rec #:  [id number]  Height:       68.0 in Accession #:    [id number] Weight:       152.6 lb Date of Birth:  12-05-1946   BSA:          1.822 m Patient Age:    77 years   BP:           111/68 mmHg Patient Gender: M          HR:           94 bpm. [facility] Procedure: 2D Echo, Cardiac Doppler and Color Doppler Indications:    SBE  History:        Patient has prior history of Echocardiogram examinations, most                 recent 05/02/2018. Acute MI, Arrythmias:RBBB,                 Signs/Symptoms:Chest Pain and Shortness of Breath; Risk                 Factors:Hypertension, Dyslipidemia and Current Smoker.  Sonographer:    Mikki Harbor Referring Phys: [id number] SHERI HAMMOCK IMPRESSIONS  1. Left ventricular ejection fraction, by estimation, is 55 to 60%. The left ventricle has normal function. The left ventricle has no regional wall motion abnormalities. There is mild left ventricular hypertrophy. Left ventricular diastolic parameters are indeterminate. Elevated left atrial pressure.  2. Right ventricular systolic function is normal. The right ventricular size is normal. There is mildly elevated pulmonary artery systolic pressure.  3. Left atrial size was moderately dilated.  4. Right atrial size was moderately dilated.  5. The mitral valve is abnormal. Mild to moderate mitral valve regurgitation. No evidence of mitral stenosis.  6. The tricuspid valve is abnormal.  7. The aortic valve was not well visualized. There is severe calcifcation of the aortic valve. There is severe thickening of the aortic valve. Aortic valve regurgitation is moderate to severe. Severe aortic valve stenosis. Severe aortic stenosis is present. Aortic valve mean  gradient measures 42.0 mmHg. Aortic valve peak gradient measures 69.2 mmHg. Aortic valve area, by VTI measures 0.89 cm.  8. The inferior vena cava is dilated in size with >50% respiratory variability, suggesting right atrial pressure of 8 mmHg. FINDINGS  Left Ventricle: Left ventricular ejection fraction, by estimation, is 55 to 60%. The left ventricle has normal function. The left ventricle has no regional wall motion abnormalities. The left ventricular internal cavity size was normal in size. There is  mild left ventricular hypertrophy. Left ventricular diastolic parameters are indeterminate. Elevated left atrial pressure. Right Ventricle: The right ventricular size is normal. Right vetricular wall thickness was not well visualized. Right ventricular systolic function is normal. There is mildly elevated pulmonary artery systolic pressure. The tricuspid regurgitant velocity  is 3.02 m/s, and with an assumed right atrial pressure of 8 mmHg, the estimated right ventricular systolic pressure is 44.5 mmHg. Left Atrium: Left atrial size was moderately dilated. Right Atrium: Right atrial size was moderately dilated. Pericardium: There is no evidence of pericardial effusion. Mitral Valve: The mitral valve is abnormal. Mild mitral annular calcification. Mild to moderate mitral valve regurgitation. No evidence of mitral valve stenosis. MV  Physician Discharge Summary  Ngai Konigsberg ZOX:[id number] DOB: 11/06/1946 DOA: 10/03/2023  PCP: Mila Palmer, MD  Admit date: 10/03/2023 Discharge date: 10/12/2023  Admitted From: Home Disposition: Home  Recommendations for Outpatient Follow-up:  Follow up with PCP in 1-2 weeks Please obtain BMP/CBC in one week your next doctors visit.  Aspirin and Plavix challenge for about 1 week until outpatient follow-up with structural/cardiac team. Current continue/maintain Foley catheter. Continue Coreg, Norvasc. 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  He was found to be anemic requiring 1 unit PRBC transfusion but also due to concerns of NSTEMI patient was transferred to [facility] for cardiology consultation where he received additional units of PRBC and treatment for NSTEMI.  Eventually cardiology decided to transfer patient to [facility] on 10/27 for cardiac catheterization which showed moderate left main stenosis, severe RCA disease.  Cardiology is not planning on stenting left main and performing TAVR. Eventually was decided to discharge patient home on aspirin and Plavix for 1-2 weeks with outpatient follow-up with cardiology to determine when to proceed with TAVR/PCI.     Assessment & Plan:  Principal Problem:   Acute blood loss anemia Active Problems:   Coronary artery disease   Acute on chronic diastolic CHF (congestive heart failure) (HCC)   Essential hypertension   Lesion of right native kidney   Dyslipidemia, goal LDL below 70   Nicotine dependence, cigarettes, uncomplicated   Acute  blood loss anemia secondary to hematuria, resolved BPH with chronic indwelling Foley catheter Continuous chronic Foley catheter for chronic obstruction and intermittent hematuria.  PRBC transfusion as necessary.  Eventually outpatient follow-up.   Symptomatic severe aortic stenosis Coronary artery disease/NSTEMI -Initially patient suffered from NSTEMI which was medically managed but eventually after performing left heart catheterization 10/21.  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     DVT prophylaxis: SCDs Start: 10/03/23 0805 Code Status: Full code Family Communication:   Status is: Inpatient Remains inpatient appropriate because: Discharge today     Discharge Diagnoses:  Principal Problem:   Acute blood loss anemia Active Problems:   Coronary artery disease   Acute on chronic diastolic CHF (congestive heart failure) (HCC)   Essential hypertension   Lesion of right native kidney   Dyslipidemia, goal LDL below 70   Nicotine dependence, cigarettes, uncomplicated   NSTEMI (non-ST elevated myocardial infarction) [facility])   Aortic valve disease      Consultations: Cardiology  Subjective: feeling okay  Discharge Exam: Vitals:   10/12/23 0723 10/12/23 1100  BP: 131/60 (!) 124/52  Pulse: 72 60  Resp: 16 18  Temp: 98.1 F (36.7 C) 97.7 F (36.5 C)  SpO2: 97% 95%  Electronically Signed   By: Carey Bullocks M.D.   On: 10/05/2023 12:44   ECHOCARDIOGRAM COMPLETE  Result Date: 10/03/2023    ECHOCARDIOGRAM REPORT   Patient Name:   Kyle Matthews Date of Exam: 10/03/2023 Medical Rec #:  [id number]  Height:       68.0 in Accession #:    [id number] Weight:       152.6 lb Date of Birth:  12-05-1946   BSA:          1.822 m Patient Age:    77 years   BP:           111/68 mmHg Patient Gender: M          HR:           94 bpm. [facility] Procedure: 2D Echo, Cardiac Doppler and Color Doppler Indications:    SBE  History:        Patient has prior history of Echocardiogram examinations, most                 recent 05/02/2018. Acute MI, Arrythmias:RBBB,                 Signs/Symptoms:Chest Pain and Shortness of Breath; Risk                 Factors:Hypertension, Dyslipidemia and Current Smoker.  Sonographer:    Mikki Harbor Referring Phys: [id number] SHERI HAMMOCK IMPRESSIONS  1. Left ventricular ejection fraction, by estimation, is 55 to 60%. The left ventricle has normal function. The left ventricle has no regional wall motion abnormalities. There is mild left ventricular hypertrophy. Left ventricular diastolic parameters are indeterminate. Elevated left atrial pressure.  2. Right ventricular systolic function is normal. The right ventricular size is normal. There is mildly elevated pulmonary artery systolic pressure.  3. Left atrial size was moderately dilated.  4. Right atrial size was moderately dilated.  5. The mitral valve is abnormal. Mild to moderate mitral valve regurgitation. No evidence of mitral stenosis.  6. The tricuspid valve is abnormal.  7. The aortic valve was not well visualized. There is severe calcifcation of the aortic valve. There is severe thickening of the aortic valve. Aortic valve regurgitation is moderate to severe. Severe aortic valve stenosis. Severe aortic stenosis is present. Aortic valve mean  gradient measures 42.0 mmHg. Aortic valve peak gradient measures 69.2 mmHg. Aortic valve area, by VTI measures 0.89 cm.  8. The inferior vena cava is dilated in size with >50% respiratory variability, suggesting right atrial pressure of 8 mmHg. FINDINGS  Left Ventricle: Left ventricular ejection fraction, by estimation, is 55 to 60%. The left ventricle has normal function. The left ventricle has no regional wall motion abnormalities. The left ventricular internal cavity size was normal in size. There is  mild left ventricular hypertrophy. Left ventricular diastolic parameters are indeterminate. Elevated left atrial pressure. Right Ventricle: The right ventricular size is normal. Right vetricular wall thickness was not well visualized. Right ventricular systolic function is normal. There is mildly elevated pulmonary artery systolic pressure. The tricuspid regurgitant velocity  is 3.02 m/s, and with an assumed right atrial pressure of 8 mmHg, the estimated right ventricular systolic pressure is 44.5 mmHg. Left Atrium: Left atrial size was moderately dilated. Right Atrium: Right atrial size was moderately dilated. Pericardium: There is no evidence of pericardial effusion. Mitral Valve: The mitral valve is abnormal. Mild mitral annular calcification. Mild to moderate mitral valve regurgitation. No evidence of mitral valve stenosis. MV  Physician Discharge Summary  Ngai Konigsberg ZOX:[id number] DOB: 11/06/1946 DOA: 10/03/2023  PCP: Mila Palmer, MD  Admit date: 10/03/2023 Discharge date: 10/12/2023  Admitted From: Home Disposition: Home  Recommendations for Outpatient Follow-up:  Follow up with PCP in 1-2 weeks Please obtain BMP/CBC in one week your next doctors visit.  Aspirin and Plavix challenge for about 1 week until outpatient follow-up with structural/cardiac team. Current continue/maintain Foley catheter. Continue Coreg, Norvasc. 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  He was found to be anemic requiring 1 unit PRBC transfusion but also due to concerns of NSTEMI patient was transferred to [facility] for cardiology consultation where he received additional units of PRBC and treatment for NSTEMI.  Eventually cardiology decided to transfer patient to [facility] on 10/27 for cardiac catheterization which showed moderate left main stenosis, severe RCA disease.  Cardiology is not planning on stenting left main and performing TAVR. Eventually was decided to discharge patient home on aspirin and Plavix for 1-2 weeks with outpatient follow-up with cardiology to determine when to proceed with TAVR/PCI.     Assessment & Plan:  Principal Problem:   Acute blood loss anemia Active Problems:   Coronary artery disease   Acute on chronic diastolic CHF (congestive heart failure) (HCC)   Essential hypertension   Lesion of right native kidney   Dyslipidemia, goal LDL below 70   Nicotine dependence, cigarettes, uncomplicated   Acute  blood loss anemia secondary to hematuria, resolved BPH with chronic indwelling Foley catheter Continuous chronic Foley catheter for chronic obstruction and intermittent hematuria.  PRBC transfusion as necessary.  Eventually outpatient follow-up.   Symptomatic severe aortic stenosis Coronary artery disease/NSTEMI -Initially patient suffered from NSTEMI which was medically managed but eventually after performing left heart catheterization 10/21.  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     DVT prophylaxis: SCDs Start: 10/03/23 0805 Code Status: Full code Family Communication:   Status is: Inpatient Remains inpatient appropriate because: Discharge today     Discharge Diagnoses:  Principal Problem:   Acute blood loss anemia Active Problems:   Coronary artery disease   Acute on chronic diastolic CHF (congestive heart failure) (HCC)   Essential hypertension   Lesion of right native kidney   Dyslipidemia, goal LDL below 70   Nicotine dependence, cigarettes, uncomplicated   NSTEMI (non-ST elevated myocardial infarction) [facility])   Aortic valve disease      Consultations: Cardiology  Subjective: feeling okay  Discharge Exam: Vitals:   10/12/23 0723 10/12/23 1100  BP: 131/60 (!) 124/52  Pulse: 72 60  Resp: 16 18  Temp: 98.1 F (36.7 C) 97.7 F (36.5 C)  SpO2: 97% 95%  Physician Discharge Summary  Ngai Konigsberg ZOX:[id number] DOB: 11/06/1946 DOA: 10/03/2023  PCP: Mila Palmer, MD  Admit date: 10/03/2023 Discharge date: 10/12/2023  Admitted From: Home Disposition: Home  Recommendations for Outpatient Follow-up:  Follow up with PCP in 1-2 weeks Please obtain BMP/CBC in one week your next doctors visit.  Aspirin and Plavix challenge for about 1 week until outpatient follow-up with structural/cardiac team. Current continue/maintain Foley catheter. Continue Coreg, Norvasc. 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  He was found to be anemic requiring 1 unit PRBC transfusion but also due to concerns of NSTEMI patient was transferred to [facility] for cardiology consultation where he received additional units of PRBC and treatment for NSTEMI.  Eventually cardiology decided to transfer patient to [facility] on 10/27 for cardiac catheterization which showed moderate left main stenosis, severe RCA disease.  Cardiology is not planning on stenting left main and performing TAVR. Eventually was decided to discharge patient home on aspirin and Plavix for 1-2 weeks with outpatient follow-up with cardiology to determine when to proceed with TAVR/PCI.     Assessment & Plan:  Principal Problem:   Acute blood loss anemia Active Problems:   Coronary artery disease   Acute on chronic diastolic CHF (congestive heart failure) (HCC)   Essential hypertension   Lesion of right native kidney   Dyslipidemia, goal LDL below 70   Nicotine dependence, cigarettes, uncomplicated   Acute  blood loss anemia secondary to hematuria, resolved BPH with chronic indwelling Foley catheter Continuous chronic Foley catheter for chronic obstruction and intermittent hematuria.  PRBC transfusion as necessary.  Eventually outpatient follow-up.   Symptomatic severe aortic stenosis Coronary artery disease/NSTEMI -Initially patient suffered from NSTEMI which was medically managed but eventually after performing left heart catheterization 10/21.  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     DVT prophylaxis: SCDs Start: 10/03/23 0805 Code Status: Full code Family Communication:   Status is: Inpatient Remains inpatient appropriate because: Discharge today     Discharge Diagnoses:  Principal Problem:   Acute blood loss anemia Active Problems:   Coronary artery disease   Acute on chronic diastolic CHF (congestive heart failure) (HCC)   Essential hypertension   Lesion of right native kidney   Dyslipidemia, goal LDL below 70   Nicotine dependence, cigarettes, uncomplicated   NSTEMI (non-ST elevated myocardial infarction) [facility])   Aortic valve disease      Consultations: Cardiology  Subjective: feeling okay  Discharge Exam: Vitals:   10/12/23 0723 10/12/23 1100  BP: 131/60 (!) 124/52  Pulse: 72 60  Resp: 16 18  Temp: 98.1 F (36.7 C) 97.7 F (36.5 C)  SpO2: 97% 95%  Electronically Signed   By: Carey Bullocks M.D.   On: 10/05/2023 12:44   ECHOCARDIOGRAM COMPLETE  Result Date: 10/03/2023    ECHOCARDIOGRAM REPORT   Patient Name:   Kyle Matthews Date of Exam: 10/03/2023 Medical Rec #:  [id number]  Height:       68.0 in Accession #:    [id number] Weight:       152.6 lb Date of Birth:  12-05-1946   BSA:          1.822 m Patient Age:    77 years   BP:           111/68 mmHg Patient Gender: M          HR:           94 bpm. [facility] Procedure: 2D Echo, Cardiac Doppler and Color Doppler Indications:    SBE  History:        Patient has prior history of Echocardiogram examinations, most                 recent 05/02/2018. Acute MI, Arrythmias:RBBB,                 Signs/Symptoms:Chest Pain and Shortness of Breath; Risk                 Factors:Hypertension, Dyslipidemia and Current Smoker.  Sonographer:    Mikki Harbor Referring Phys: [id number] SHERI HAMMOCK IMPRESSIONS  1. Left ventricular ejection fraction, by estimation, is 55 to 60%. The left ventricle has normal function. The left ventricle has no regional wall motion abnormalities. There is mild left ventricular hypertrophy. Left ventricular diastolic parameters are indeterminate. Elevated left atrial pressure.  2. Right ventricular systolic function is normal. The right ventricular size is normal. There is mildly elevated pulmonary artery systolic pressure.  3. Left atrial size was moderately dilated.  4. Right atrial size was moderately dilated.  5. The mitral valve is abnormal. Mild to moderate mitral valve regurgitation. No evidence of mitral stenosis.  6. The tricuspid valve is abnormal.  7. The aortic valve was not well visualized. There is severe calcifcation of the aortic valve. There is severe thickening of the aortic valve. Aortic valve regurgitation is moderate to severe. Severe aortic valve stenosis. Severe aortic stenosis is present. Aortic valve mean  gradient measures 42.0 mmHg. Aortic valve peak gradient measures 69.2 mmHg. Aortic valve area, by VTI measures 0.89 cm.  8. The inferior vena cava is dilated in size with >50% respiratory variability, suggesting right atrial pressure of 8 mmHg. FINDINGS  Left Ventricle: Left ventricular ejection fraction, by estimation, is 55 to 60%. The left ventricle has normal function. The left ventricle has no regional wall motion abnormalities. The left ventricular internal cavity size was normal in size. There is  mild left ventricular hypertrophy. Left ventricular diastolic parameters are indeterminate. Elevated left atrial pressure. Right Ventricle: The right ventricular size is normal. Right vetricular wall thickness was not well visualized. Right ventricular systolic function is normal. There is mildly elevated pulmonary artery systolic pressure. The tricuspid regurgitant velocity  is 3.02 m/s, and with an assumed right atrial pressure of 8 mmHg, the estimated right ventricular systolic pressure is 44.5 mmHg. Left Atrium: Left atrial size was moderately dilated. Right Atrium: Right atrial size was moderately dilated. Pericardium: There is no evidence of pericardial effusion. Mitral Valve: The mitral valve is abnormal. Mild mitral annular calcification. Mild to moderate mitral valve regurgitation. No evidence of mitral valve stenosis. MV  Electronically Signed   By: Carey Bullocks M.D.   On: 10/05/2023 12:44   ECHOCARDIOGRAM COMPLETE  Result Date: 10/03/2023    ECHOCARDIOGRAM REPORT   Patient Name:   Kyle Matthews Date of Exam: 10/03/2023 Medical Rec #:  [id number]  Height:       68.0 in Accession #:    [id number] Weight:       152.6 lb Date of Birth:  12-05-1946   BSA:          1.822 m Patient Age:    77 years   BP:           111/68 mmHg Patient Gender: M          HR:           94 bpm. [facility] Procedure: 2D Echo, Cardiac Doppler and Color Doppler Indications:    SBE  History:        Patient has prior history of Echocardiogram examinations, most                 recent 05/02/2018. Acute MI, Arrythmias:RBBB,                 Signs/Symptoms:Chest Pain and Shortness of Breath; Risk                 Factors:Hypertension, Dyslipidemia and Current Smoker.  Sonographer:    Mikki Harbor Referring Phys: [id number] SHERI HAMMOCK IMPRESSIONS  1. Left ventricular ejection fraction, by estimation, is 55 to 60%. The left ventricle has normal function. The left ventricle has no regional wall motion abnormalities. There is mild left ventricular hypertrophy. Left ventricular diastolic parameters are indeterminate. Elevated left atrial pressure.  2. Right ventricular systolic function is normal. The right ventricular size is normal. There is mildly elevated pulmonary artery systolic pressure.  3. Left atrial size was moderately dilated.  4. Right atrial size was moderately dilated.  5. The mitral valve is abnormal. Mild to moderate mitral valve regurgitation. No evidence of mitral stenosis.  6. The tricuspid valve is abnormal.  7. The aortic valve was not well visualized. There is severe calcifcation of the aortic valve. There is severe thickening of the aortic valve. Aortic valve regurgitation is moderate to severe. Severe aortic valve stenosis. Severe aortic stenosis is present. Aortic valve mean  gradient measures 42.0 mmHg. Aortic valve peak gradient measures 69.2 mmHg. Aortic valve area, by VTI measures 0.89 cm.  8. The inferior vena cava is dilated in size with >50% respiratory variability, suggesting right atrial pressure of 8 mmHg. FINDINGS  Left Ventricle: Left ventricular ejection fraction, by estimation, is 55 to 60%. The left ventricle has normal function. The left ventricle has no regional wall motion abnormalities. The left ventricular internal cavity size was normal in size. There is  mild left ventricular hypertrophy. Left ventricular diastolic parameters are indeterminate. Elevated left atrial pressure. Right Ventricle: The right ventricular size is normal. Right vetricular wall thickness was not well visualized. Right ventricular systolic function is normal. There is mildly elevated pulmonary artery systolic pressure. The tricuspid regurgitant velocity  is 3.02 m/s, and with an assumed right atrial pressure of 8 mmHg, the estimated right ventricular systolic pressure is 44.5 mmHg. Left Atrium: Left atrial size was moderately dilated. Right Atrium: Right atrial size was moderately dilated. Pericardium: There is no evidence of pericardial effusion. Mitral Valve: The mitral valve is abnormal. Mild mitral annular calcification. Mild to moderate mitral valve regurgitation. No evidence of mitral valve stenosis. MV  Physician Discharge Summary  Ngai Konigsberg ZOX:[id number] DOB: 11/06/1946 DOA: 10/03/2023  PCP: Mila Palmer, MD  Admit date: 10/03/2023 Discharge date: 10/12/2023  Admitted From: Home Disposition: Home  Recommendations for Outpatient Follow-up:  Follow up with PCP in 1-2 weeks Please obtain BMP/CBC in one week your next doctors visit.  Aspirin and Plavix challenge for about 1 week until outpatient follow-up with structural/cardiac team. Current continue/maintain Foley catheter. Continue Coreg, Norvasc. 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  He was found to be anemic requiring 1 unit PRBC transfusion but also due to concerns of NSTEMI patient was transferred to [facility] for cardiology consultation where he received additional units of PRBC and treatment for NSTEMI.  Eventually cardiology decided to transfer patient to [facility] on 10/27 for cardiac catheterization which showed moderate left main stenosis, severe RCA disease.  Cardiology is not planning on stenting left main and performing TAVR. Eventually was decided to discharge patient home on aspirin and Plavix for 1-2 weeks with outpatient follow-up with cardiology to determine when to proceed with TAVR/PCI.     Assessment & Plan:  Principal Problem:   Acute blood loss anemia Active Problems:   Coronary artery disease   Acute on chronic diastolic CHF (congestive heart failure) (HCC)   Essential hypertension   Lesion of right native kidney   Dyslipidemia, goal LDL below 70   Nicotine dependence, cigarettes, uncomplicated   Acute  blood loss anemia secondary to hematuria, resolved BPH with chronic indwelling Foley catheter Continuous chronic Foley catheter for chronic obstruction and intermittent hematuria.  PRBC transfusion as necessary.  Eventually outpatient follow-up.   Symptomatic severe aortic stenosis Coronary artery disease/NSTEMI -Initially patient suffered from NSTEMI which was medically managed but eventually after performing left heart catheterization 10/21.  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     DVT prophylaxis: SCDs Start: 10/03/23 0805 Code Status: Full code Family Communication:   Status is: Inpatient Remains inpatient appropriate because: Discharge today     Discharge Diagnoses:  Principal Problem:   Acute blood loss anemia Active Problems:   Coronary artery disease   Acute on chronic diastolic CHF (congestive heart failure) (HCC)   Essential hypertension   Lesion of right native kidney   Dyslipidemia, goal LDL below 70   Nicotine dependence, cigarettes, uncomplicated   NSTEMI (non-ST elevated myocardial infarction) [facility])   Aortic valve disease      Consultations: Cardiology  Subjective: feeling okay  Discharge Exam: Vitals:   10/12/23 0723 10/12/23 1100  BP: 131/60 (!) 124/52  Pulse: 72 60  Resp: 16 18  Temp: 98.1 F (36.7 C) 97.7 F (36.5 C)  SpO2: 97% 95%  Physician Discharge Summary  Ngai Konigsberg ZOX:[id number] DOB: 11/06/1946 DOA: 10/03/2023  PCP: Mila Palmer, MD  Admit date: 10/03/2023 Discharge date: 10/12/2023  Admitted From: Home Disposition: Home  Recommendations for Outpatient Follow-up:  Follow up with PCP in 1-2 weeks Please obtain BMP/CBC in one week your next doctors visit.  Aspirin and Plavix challenge for about 1 week until outpatient follow-up with structural/cardiac team. Current continue/maintain Foley catheter. Continue Coreg, Norvasc. 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  He was found to be anemic requiring 1 unit PRBC transfusion but also due to concerns of NSTEMI patient was transferred to [facility] for cardiology consultation where he received additional units of PRBC and treatment for NSTEMI.  Eventually cardiology decided to transfer patient to [facility] on 10/27 for cardiac catheterization which showed moderate left main stenosis, severe RCA disease.  Cardiology is not planning on stenting left main and performing TAVR. Eventually was decided to discharge patient home on aspirin and Plavix for 1-2 weeks with outpatient follow-up with cardiology to determine when to proceed with TAVR/PCI.     Assessment & Plan:  Principal Problem:   Acute blood loss anemia Active Problems:   Coronary artery disease   Acute on chronic diastolic CHF (congestive heart failure) (HCC)   Essential hypertension   Lesion of right native kidney   Dyslipidemia, goal LDL below 70   Nicotine dependence, cigarettes, uncomplicated   Acute  blood loss anemia secondary to hematuria, resolved BPH with chronic indwelling Foley catheter Continuous chronic Foley catheter for chronic obstruction and intermittent hematuria.  PRBC transfusion as necessary.  Eventually outpatient follow-up.   Symptomatic severe aortic stenosis Coronary artery disease/NSTEMI -Initially patient suffered from NSTEMI which was medically managed but eventually after performing left heart catheterization 10/21.  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     DVT prophylaxis: SCDs Start: 10/03/23 0805 Code Status: Full code Family Communication:   Status is: Inpatient Remains inpatient appropriate because: Discharge today     Discharge Diagnoses:  Principal Problem:   Acute blood loss anemia Active Problems:   Coronary artery disease   Acute on chronic diastolic CHF (congestive heart failure) (HCC)   Essential hypertension   Lesion of right native kidney   Dyslipidemia, goal LDL below 70   Nicotine dependence, cigarettes, uncomplicated   NSTEMI (non-ST elevated myocardial infarction) [facility])   Aortic valve disease      Consultations: Cardiology  Subjective: feeling okay  Discharge Exam: Vitals:   10/12/23 0723 10/12/23 1100  BP: 131/60 (!) 124/52  Pulse: 72 60  Resp: 16 18  Temp: 98.1 F (36.7 C) 97.7 F (36.5 C)  SpO2: 97% 95%  Physician Discharge Summary  Ngai Konigsberg ZOX:[id number] DOB: 11/06/1946 DOA: 10/03/2023  PCP: Mila Palmer, MD  Admit date: 10/03/2023 Discharge date: 10/12/2023  Admitted From: Home Disposition: Home  Recommendations for Outpatient Follow-up:  Follow up with PCP in 1-2 weeks Please obtain BMP/CBC in one week your next doctors visit.  Aspirin and Plavix challenge for about 1 week until outpatient follow-up with structural/cardiac team. Current continue/maintain Foley catheter. Continue Coreg, Norvasc. 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  He was found to be anemic requiring 1 unit PRBC transfusion but also due to concerns of NSTEMI patient was transferred to [facility] for cardiology consultation where he received additional units of PRBC and treatment for NSTEMI.  Eventually cardiology decided to transfer patient to [facility] on 10/27 for cardiac catheterization which showed moderate left main stenosis, severe RCA disease.  Cardiology is not planning on stenting left main and performing TAVR. Eventually was decided to discharge patient home on aspirin and Plavix for 1-2 weeks with outpatient follow-up with cardiology to determine when to proceed with TAVR/PCI.     Assessment & Plan:  Principal Problem:   Acute blood loss anemia Active Problems:   Coronary artery disease   Acute on chronic diastolic CHF (congestive heart failure) (HCC)   Essential hypertension   Lesion of right native kidney   Dyslipidemia, goal LDL below 70   Nicotine dependence, cigarettes, uncomplicated   Acute  blood loss anemia secondary to hematuria, resolved BPH with chronic indwelling Foley catheter Continuous chronic Foley catheter for chronic obstruction and intermittent hematuria.  PRBC transfusion as necessary.  Eventually outpatient follow-up.   Symptomatic severe aortic stenosis Coronary artery disease/NSTEMI -Initially patient suffered from NSTEMI which was medically managed but eventually after performing left heart catheterization 10/21.  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     DVT prophylaxis: SCDs Start: 10/03/23 0805 Code Status: Full code Family Communication:   Status is: Inpatient Remains inpatient appropriate because: Discharge today     Discharge Diagnoses:  Principal Problem:   Acute blood loss anemia Active Problems:   Coronary artery disease   Acute on chronic diastolic CHF (congestive heart failure) (HCC)   Essential hypertension   Lesion of right native kidney   Dyslipidemia, goal LDL below 70   Nicotine dependence, cigarettes, uncomplicated   NSTEMI (non-ST elevated myocardial infarction) [facility])   Aortic valve disease      Consultations: Cardiology  Subjective: feeling okay  Discharge Exam: Vitals:   10/12/23 0723 10/12/23 1100  BP: 131/60 (!) 124/52  Pulse: 72 60  Resp: 16 18  Temp: 98.1 F (36.7 C) 97.7 F (36.5 C)  SpO2: 97% 95%  Physician Discharge Summary  Ngai Konigsberg ZOX:[id number] DOB: 11/06/1946 DOA: 10/03/2023  PCP: Mila Palmer, MD  Admit date: 10/03/2023 Discharge date: 10/12/2023  Admitted From: Home Disposition: Home  Recommendations for Outpatient Follow-up:  Follow up with PCP in 1-2 weeks Please obtain BMP/CBC in one week your next doctors visit.  Aspirin and Plavix challenge for about 1 week until outpatient follow-up with structural/cardiac team. Current continue/maintain Foley catheter. Continue Coreg, Norvasc. 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  He was found to be anemic requiring 1 unit PRBC transfusion but also due to concerns of NSTEMI patient was transferred to [facility] for cardiology consultation where he received additional units of PRBC and treatment for NSTEMI.  Eventually cardiology decided to transfer patient to [facility] on 10/27 for cardiac catheterization which showed moderate left main stenosis, severe RCA disease.  Cardiology is not planning on stenting left main and performing TAVR. Eventually was decided to discharge patient home on aspirin and Plavix for 1-2 weeks with outpatient follow-up with cardiology to determine when to proceed with TAVR/PCI.     Assessment & Plan:  Principal Problem:   Acute blood loss anemia Active Problems:   Coronary artery disease   Acute on chronic diastolic CHF (congestive heart failure) (HCC)   Essential hypertension   Lesion of right native kidney   Dyslipidemia, goal LDL below 70   Nicotine dependence, cigarettes, uncomplicated   Acute  blood loss anemia secondary to hematuria, resolved BPH with chronic indwelling Foley catheter Continuous chronic Foley catheter for chronic obstruction and intermittent hematuria.  PRBC transfusion as necessary.  Eventually outpatient follow-up.   Symptomatic severe aortic stenosis Coronary artery disease/NSTEMI -Initially patient suffered from NSTEMI which was medically managed but eventually after performing left heart catheterization 10/21.  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     DVT prophylaxis: SCDs Start: 10/03/23 0805 Code Status: Full code Family Communication:   Status is: Inpatient Remains inpatient appropriate because: Discharge today     Discharge Diagnoses:  Principal Problem:   Acute blood loss anemia Active Problems:   Coronary artery disease   Acute on chronic diastolic CHF (congestive heart failure) (HCC)   Essential hypertension   Lesion of right native kidney   Dyslipidemia, goal LDL below 70   Nicotine dependence, cigarettes, uncomplicated   NSTEMI (non-ST elevated myocardial infarction) [facility])   Aortic valve disease      Consultations: Cardiology  Subjective: feeling okay  Discharge Exam: Vitals:   10/12/23 0723 10/12/23 1100  BP: 131/60 (!) 124/52  Pulse: 72 60  Resp: 16 18  Temp: 98.1 F (36.7 C) 97.7 F (36.5 C)  SpO2: 97% 95%  Electronically Signed   By: Carey Bullocks M.D.   On: 10/05/2023 12:44   ECHOCARDIOGRAM COMPLETE  Result Date: 10/03/2023    ECHOCARDIOGRAM REPORT   Patient Name:   Kyle Matthews Date of Exam: 10/03/2023 Medical Rec #:  [id number]  Height:       68.0 in Accession #:    [id number] Weight:       152.6 lb Date of Birth:  12-05-1946   BSA:          1.822 m Patient Age:    77 years   BP:           111/68 mmHg Patient Gender: M          HR:           94 bpm. [facility] Procedure: 2D Echo, Cardiac Doppler and Color Doppler Indications:    SBE  History:        Patient has prior history of Echocardiogram examinations, most                 recent 05/02/2018. Acute MI, Arrythmias:RBBB,                 Signs/Symptoms:Chest Pain and Shortness of Breath; Risk                 Factors:Hypertension, Dyslipidemia and Current Smoker.  Sonographer:    Mikki Harbor Referring Phys: [id number] SHERI HAMMOCK IMPRESSIONS  1. Left ventricular ejection fraction, by estimation, is 55 to 60%. The left ventricle has normal function. The left ventricle has no regional wall motion abnormalities. There is mild left ventricular hypertrophy. Left ventricular diastolic parameters are indeterminate. Elevated left atrial pressure.  2. Right ventricular systolic function is normal. The right ventricular size is normal. There is mildly elevated pulmonary artery systolic pressure.  3. Left atrial size was moderately dilated.  4. Right atrial size was moderately dilated.  5. The mitral valve is abnormal. Mild to moderate mitral valve regurgitation. No evidence of mitral stenosis.  6. The tricuspid valve is abnormal.  7. The aortic valve was not well visualized. There is severe calcifcation of the aortic valve. There is severe thickening of the aortic valve. Aortic valve regurgitation is moderate to severe. Severe aortic valve stenosis. Severe aortic stenosis is present. Aortic valve mean  gradient measures 42.0 mmHg. Aortic valve peak gradient measures 69.2 mmHg. Aortic valve area, by VTI measures 0.89 cm.  8. The inferior vena cava is dilated in size with >50% respiratory variability, suggesting right atrial pressure of 8 mmHg. FINDINGS  Left Ventricle: Left ventricular ejection fraction, by estimation, is 55 to 60%. The left ventricle has normal function. The left ventricle has no regional wall motion abnormalities. The left ventricular internal cavity size was normal in size. There is  mild left ventricular hypertrophy. Left ventricular diastolic parameters are indeterminate. Elevated left atrial pressure. Right Ventricle: The right ventricular size is normal. Right vetricular wall thickness was not well visualized. Right ventricular systolic function is normal. There is mildly elevated pulmonary artery systolic pressure. The tricuspid regurgitant velocity  is 3.02 m/s, and with an assumed right atrial pressure of 8 mmHg, the estimated right ventricular systolic pressure is 44.5 mmHg. Left Atrium: Left atrial size was moderately dilated. Right Atrium: Right atrial size was moderately dilated. Pericardium: There is no evidence of pericardial effusion. Mitral Valve: The mitral valve is abnormal. Mild mitral annular calcification. Mild to moderate mitral valve regurgitation. No evidence of mitral valve stenosis. MV  Electronically Signed   By: Carey Bullocks M.D.   On: 10/05/2023 12:44   ECHOCARDIOGRAM COMPLETE  Result Date: 10/03/2023    ECHOCARDIOGRAM REPORT   Patient Name:   Kyle Matthews Date of Exam: 10/03/2023 Medical Rec #:  [id number]  Height:       68.0 in Accession #:    [id number] Weight:       152.6 lb Date of Birth:  12-05-1946   BSA:          1.822 m Patient Age:    77 years   BP:           111/68 mmHg Patient Gender: M          HR:           94 bpm. [facility] Procedure: 2D Echo, Cardiac Doppler and Color Doppler Indications:    SBE  History:        Patient has prior history of Echocardiogram examinations, most                 recent 05/02/2018. Acute MI, Arrythmias:RBBB,                 Signs/Symptoms:Chest Pain and Shortness of Breath; Risk                 Factors:Hypertension, Dyslipidemia and Current Smoker.  Sonographer:    Mikki Harbor Referring Phys: [id number] SHERI HAMMOCK IMPRESSIONS  1. Left ventricular ejection fraction, by estimation, is 55 to 60%. The left ventricle has normal function. The left ventricle has no regional wall motion abnormalities. There is mild left ventricular hypertrophy. Left ventricular diastolic parameters are indeterminate. Elevated left atrial pressure.  2. Right ventricular systolic function is normal. The right ventricular size is normal. There is mildly elevated pulmonary artery systolic pressure.  3. Left atrial size was moderately dilated.  4. Right atrial size was moderately dilated.  5. The mitral valve is abnormal. Mild to moderate mitral valve regurgitation. No evidence of mitral stenosis.  6. The tricuspid valve is abnormal.  7. The aortic valve was not well visualized. There is severe calcifcation of the aortic valve. There is severe thickening of the aortic valve. Aortic valve regurgitation is moderate to severe. Severe aortic valve stenosis. Severe aortic stenosis is present. Aortic valve mean  gradient measures 42.0 mmHg. Aortic valve peak gradient measures 69.2 mmHg. Aortic valve area, by VTI measures 0.89 cm.  8. The inferior vena cava is dilated in size with >50% respiratory variability, suggesting right atrial pressure of 8 mmHg. FINDINGS  Left Ventricle: Left ventricular ejection fraction, by estimation, is 55 to 60%. The left ventricle has normal function. The left ventricle has no regional wall motion abnormalities. The left ventricular internal cavity size was normal in size. There is  mild left ventricular hypertrophy. Left ventricular diastolic parameters are indeterminate. Elevated left atrial pressure. Right Ventricle: The right ventricular size is normal. Right vetricular wall thickness was not well visualized. Right ventricular systolic function is normal. There is mildly elevated pulmonary artery systolic pressure. The tricuspid regurgitant velocity  is 3.02 m/s, and with an assumed right atrial pressure of 8 mmHg, the estimated right ventricular systolic pressure is 44.5 mmHg. Left Atrium: Left atrial size was moderately dilated. Right Atrium: Right atrial size was moderately dilated. Pericardium: There is no evidence of pericardial effusion. Mitral Valve: The mitral valve is abnormal. Mild mitral annular calcification. Mild to moderate mitral valve regurgitation. No evidence of mitral valve stenosis. MV  Electronically Signed   By: Carey Bullocks M.D.   On: 10/05/2023 12:44   ECHOCARDIOGRAM COMPLETE  Result Date: 10/03/2023    ECHOCARDIOGRAM REPORT   Patient Name:   Kyle Matthews Date of Exam: 10/03/2023 Medical Rec #:  [id number]  Height:       68.0 in Accession #:    [id number] Weight:       152.6 lb Date of Birth:  12-05-1946   BSA:          1.822 m Patient Age:    77 years   BP:           111/68 mmHg Patient Gender: M          HR:           94 bpm. [facility] Procedure: 2D Echo, Cardiac Doppler and Color Doppler Indications:    SBE  History:        Patient has prior history of Echocardiogram examinations, most                 recent 05/02/2018. Acute MI, Arrythmias:RBBB,                 Signs/Symptoms:Chest Pain and Shortness of Breath; Risk                 Factors:Hypertension, Dyslipidemia and Current Smoker.  Sonographer:    Mikki Harbor Referring Phys: [id number] SHERI HAMMOCK IMPRESSIONS  1. Left ventricular ejection fraction, by estimation, is 55 to 60%. The left ventricle has normal function. The left ventricle has no regional wall motion abnormalities. There is mild left ventricular hypertrophy. Left ventricular diastolic parameters are indeterminate. Elevated left atrial pressure.  2. Right ventricular systolic function is normal. The right ventricular size is normal. There is mildly elevated pulmonary artery systolic pressure.  3. Left atrial size was moderately dilated.  4. Right atrial size was moderately dilated.  5. The mitral valve is abnormal. Mild to moderate mitral valve regurgitation. No evidence of mitral stenosis.  6. The tricuspid valve is abnormal.  7. The aortic valve was not well visualized. There is severe calcifcation of the aortic valve. There is severe thickening of the aortic valve. Aortic valve regurgitation is moderate to severe. Severe aortic valve stenosis. Severe aortic stenosis is present. Aortic valve mean  gradient measures 42.0 mmHg. Aortic valve peak gradient measures 69.2 mmHg. Aortic valve area, by VTI measures 0.89 cm.  8. The inferior vena cava is dilated in size with >50% respiratory variability, suggesting right atrial pressure of 8 mmHg. FINDINGS  Left Ventricle: Left ventricular ejection fraction, by estimation, is 55 to 60%. The left ventricle has normal function. The left ventricle has no regional wall motion abnormalities. The left ventricular internal cavity size was normal in size. There is  mild left ventricular hypertrophy. Left ventricular diastolic parameters are indeterminate. Elevated left atrial pressure. Right Ventricle: The right ventricular size is normal. Right vetricular wall thickness was not well visualized. Right ventricular systolic function is normal. There is mildly elevated pulmonary artery systolic pressure. The tricuspid regurgitant velocity  is 3.02 m/s, and with an assumed right atrial pressure of 8 mmHg, the estimated right ventricular systolic pressure is 44.5 mmHg. Left Atrium: Left atrial size was moderately dilated. Right Atrium: Right atrial size was moderately dilated. Pericardium: There is no evidence of pericardial effusion. Mitral Valve: The mitral valve is abnormal. Mild mitral annular calcification. Mild to moderate mitral valve regurgitation. No evidence of mitral valve stenosis. MV

## 2023-10-12 NOTE — Progress Notes (Signed)
OT Cancellation Note  Patient Details Name: Breylan Yenser MRN: [id number] DOB: [birth year]   Cancelled Treatment:    Reason Eval/Treat Not Completed: OT screened, no needs identified, will sign off Noted pt moving well and managing ADLs in room independently. No OT eval needed at this time.  Lorre Munroe 10/12/2023, 12:38 PM

## 2023-10-12 NOTE — Progress Notes (Signed)
   Patient Name: Kyle Matthews Date of Encounter: 10/12/2023 [facility] Cardiologist: Maisie Fus, MD   Interval Summary  .    Patient resting comfortably in bed this morning, no acute complaints. Admits to some confusion and stress about deciding between SAVR and TAVR.  Vital Signs .    Vitals:   10/11/23 1917 10/12/23 0052 10/12/23 0421 10/12/23 0723  BP: (!) 117/53 (!) 117/54 132/65 131/60  Pulse: 70 64 62 72  Resp: 18 18 18 16   Temp: 99.1 F (37.3 C) 98.5 F (36.9 C) 97.9 F (36.6 C) 98.1 F (36.7 C)  TempSrc: Oral Oral Oral Oral  SpO2: 97% 98% 96% 97%  Weight:   66.5 kg   Height:        Intake/Output Summary (Last 24 hours) at 10/12/2023 0917 Last data filed at 10/12/2023 0748 Gross per 24 hour  Intake 600 ml  Output 1720 ml  Net -1120 ml      10/12/2023    4:21 AM 10/11/2023    5:13 AM 10/10/2023    4:22 AM  Last 3 Weights  Weight (lbs) 146 lb 9.6 oz 146 lb 11.2 oz 147 lb 4.3 oz  Weight (kg) 66.497 kg 66.543 kg 66.8 kg      Telemetry/ECG    Sinus rhythm - Personally Reviewed  Physical Exam .   GEN: No acute distress.   Neck: No JVD Cardiac: RRR, harsh 4/6 systolic murmur RUSB.  Respiratory: Clear to auscultation bilaterally. GI: Soft, nontender, non-distended  MS: No edema  Assessment & Plan .    Patient presented to [facility] on 10/03/2023 with intermittent chest pain and shortness of breath for the past 3 weeks. Initially transferred to [facility] but found to have AS and transferred to [facility] for further evaluation   NSTEMI  Patient with HS troponin to 4814 with symptoms of intermittent chest pain. Cardiac catheterization revealing left main disease which is felt to be suitable for PCI however antiplatelet therapy is an issue in view of hematuria.  Structural team following with consideration for PCI/TAVR vs SAVR/CABG.   No chest pain since admission. Detailed discussion held with patient this morning regarding  revascularization of LM vs watchful waiting. Continue Atorvastatin 40mg  Continue to hold ASA with anemia  Severe aortic stenosis with severe AR  Echo this admission showed LVEF of 55-60% with normal wall motion, normal RV, moderate biatrial enlargement, and severe aortic stenosis with mean gradient of but also has severe AI.  Both structural cardiology and CT surgery have seen patient. At this point, he essentially has 3 options available including SAVR/CABG, TAVR with PCI, and TAVR only with watchful waiting of LM. The latter option would provide quickest path to prostatectomy but could also make future PCI of LM challenging anatomically.  If patient chooses TAVR, this could potentially take place next week. Patient plans to discuss further with his spouse today and speak with Dr. Laneta Simmers again this afternoon.   Symptomatic Anemia Hematuria  Patient reports isolated recurrence of blood tinged urine overnight but says that this morning, urine appears normal again. HGB stable 9.6->9.5.  Hypertension  BP at goal this morning.  Continue Coreg 3.125mg  BID Continue Amlodipine 5mg  every day   For questions or updates, please contact [facility] Please consult [email or web address] for contact info under        Signed, Perlie Gold, PA-C

## 2023-10-12 NOTE — Discharge Instructions (Signed)

## 2023-10-13 ENCOUNTER — Ambulatory Visit (HOSPITAL_COMMUNITY): Admit: 2023-10-13 | Payer: Medicare Other | Admitting: Urology

## 2023-10-13 ENCOUNTER — Other Ambulatory Visit: Payer: Self-pay | Admitting: Cardiology

## 2023-10-13 SURGERY — PROSTATECTOMY, SIMPLE, ROBOT-ASSISTED
Anesthesia: General

## 2023-10-13 MED ORDER — CARVEDILOL 3.125 MG PO TABS: 3.1250 mg | ORAL_TABLET | Freq: Two times a day (BID) | ORAL | 3 refills | Status: DC

## 2023-10-15 ENCOUNTER — Telehealth: Payer: Self-pay | Admitting: Internal Medicine

## 2023-10-15 ENCOUNTER — Encounter (HOSPITAL_COMMUNITY): Payer: Self-pay

## 2023-10-15 ENCOUNTER — Other Ambulatory Visit: Payer: Self-pay

## 2023-10-15 ENCOUNTER — Emergency Department (HOSPITAL_COMMUNITY)
Admission: EM | Admit: 2023-10-15 | Discharge: 2023-10-15 | Disposition: A | Discharge status: Home / Self Care | Payer: Medicare Other | Attending: Emergency Medicine | Admitting: Emergency Medicine

## 2023-10-15 DIAGNOSIS — N3001 Acute cystitis with hematuria: Secondary | ICD-10-CM | POA: Insufficient documentation

## 2023-10-15 DIAGNOSIS — R339 Retention of urine, unspecified: Secondary | ICD-10-CM | POA: Diagnosis not present

## 2023-10-15 DIAGNOSIS — Z7982 Long term (current) use of aspirin: Secondary | ICD-10-CM | POA: Diagnosis not present

## 2023-10-15 DIAGNOSIS — R310 Gross hematuria: Secondary | ICD-10-CM

## 2023-10-15 DIAGNOSIS — Z7902 Long term (current) use of antithrombotics/antiplatelets: Secondary | ICD-10-CM | POA: Diagnosis not present

## 2023-10-15 LAB — LIPASE, BLOOD: Lipase: 29 U/L (ref 11–51)

## 2023-10-15 LAB — COMPREHENSIVE METABOLIC PANEL
ALT: 31 U/L (ref 0–44)
AST: 28 U/L (ref 15–41)
Albumin: 3 g/dL — ABNORMAL LOW (ref 3.5–5.0)
Alkaline Phosphatase: 52 U/L (ref 38–126)
Anion gap: 8 (ref 5–15)
BUN: 26 mg/dL — ABNORMAL HIGH (ref 8–23)
CO2: 22 mmol/L (ref 22–32)
Calcium: 8.7 mg/dL — ABNORMAL LOW (ref 8.9–10.3)
Chloride: 107 mmol/L (ref 98–111)
Creatinine, Ser: 0.82 mg/dL (ref 0.61–1.24)
GFR, Estimated: >60 mL/min (ref >60)
Glucose, Bld: 142 mg/dL — ABNORMAL HIGH (ref 70–99)
Potassium: 4.3 mmol/L (ref 3.5–5.1)
Sodium: 137 mmol/L (ref 135–145)
Total Bilirubin: 0.4 mg/dL (ref 0.3–1.2)
Total Protein: 6 g/dL — ABNORMAL LOW (ref 6.5–8.1)

## 2023-10-15 LAB — URINALYSIS, ROUTINE W REFLEX MICROSCOPIC
Bacteria, UA: NONE SEEN
RBC / HPF: >50 RBC/hpf (ref 0–5)

## 2023-10-15 LAB — CBC
HCT: 31.9 % — ABNORMAL LOW (ref 39.0–52.0)
Hemoglobin: 9.7 g/dL — ABNORMAL LOW (ref 13.0–17.0)
MCH: 27.4 pg (ref 26.0–34.0)
MCHC: 30.4 g/dL (ref 30.0–36.0)
MCV: 90.1 fL (ref 80.0–100.0)
Platelets: 227 10*3/uL (ref 150–400)
RBC: 3.54 MIL/uL — ABNORMAL LOW (ref 4.22–5.81)
RDW: 15.5 % (ref 11.5–15.5)
WBC: 15.6 10*3/uL — ABNORMAL HIGH (ref 4.0–10.5)
nRBC: 0 % (ref 0.0–0.2)

## 2023-10-15 MED ORDER — ONDANSETRON 4 MG PO TBDP: 4.0000 mg | ORAL_TABLET | Freq: Three times a day (TID) | ORAL | 0 refills | Status: DC | PRN

## 2023-10-15 MED ORDER — CEPHALEXIN 500 MG PO CAPS: 500.0000 mg | ORAL_CAPSULE | Freq: Two times a day (BID) | ORAL | 0 refills | Status: AC

## 2023-10-15 MED ORDER — CEFTRIAXONE SODIUM 1 G IJ SOLR
1.0000 g | Freq: Once | INTRAMUSCULAR | Status: AC
  Administered 2023-10-15: 1 g via INTRAMUSCULAR
  Filled 2023-10-15: qty 10

## 2023-10-15 NOTE — ED Notes (Signed)
Pt on shot time for receiving IM antibiotic. Can d/c at 0655.

## 2023-10-15 NOTE — ED Notes (Signed)
Pt educated to take antibiotics for entire tx regimen. Pt voiced understanding.

## 2023-10-15 NOTE — ED Notes (Signed)
Pt and wife educated on bladder irrigation. Supplies given to them to take home. Both parties voiced understanding, but stated they don't think they will be able to do it, but would try if need be.

## 2023-10-15 NOTE — Discharge Instructions (Addendum)
Follow up with your urologist- call tomorrow morning first thing. Return to the ER (or go to [facility]) for worsening or concerning symptoms. Take antibiotics as prescribed.  Zofran as prescribed and complete the full course.

## 2023-10-15 NOTE — ED Notes (Signed)
ED Provider back at bedside. 

## 2023-10-15 NOTE — ED Notes (Signed)
Manually irrigating new foley catheter. Bloody hematuria with clots. Clots removed. Urine flowing through drainage system.

## 2023-10-15 NOTE — ED Triage Notes (Signed)
Pt to ED pov from home. Pt states his urinary catheter stopped draining about 3 hours ago. Pt was recently discharged from hospital 3 days ago. Pt c/o lower abdominal pain and vomiting. He has had blood in urine as well.

## 2023-10-15 NOTE — ED Provider Notes (Signed)
Kyle Matthews Provider Note   CSN: [id number] Arrival date & time: 10/15/23  0865     History  Chief Complaint  Patient presents with   Emesis    Kyle Matthews is a 77 y.o. male.  77 year old male presents the emergency room with obstructed Foley catheter resulting in urinary retention, gross hematuria.  Patient states that the catheter was recently placed for an enlarged prostate with urinary retention, last catheter exchange was on October 5.  He was started on a blood thinner 2 days ago, yesterday noted gross hematuria, woke up today with discomfort and inability to pass any urine.  He denies fevers or vomiting.  No other complaints or concerns.       Home Medications Prior to Admission medications   Medication Sig Start Date End Date Taking? Authorizing Provider  cephALEXin (KEFLEX) 500 MG capsule Take 1 capsule (500 mg total) by mouth 2 (two) times daily for 7 days. 10/15/23 10/22/23 Yes Jeannie Fend, PA-C  ondansetron (ZOFRAN-ODT) 4 MG disintegrating tablet Take 1 tablet (4 mg total) by mouth every 8 (eight) hours as needed for nausea or vomiting. 10/15/23  Yes Jeannie Fend, PA-C  amLODipine (NORVASC) 5 MG tablet Take 1 tablet (5 mg total) by mouth daily. 10/13/23   Miguel Rota, MD  aspirin EC 81 MG tablet Take 1 tablet (81 mg total) by mouth daily. Swallow whole. 10/12/23 11/11/23  Amin, Ankit C, MD  carvedilol (COREG) 3.125 MG tablet Take 1 tablet (3.125 mg total) by mouth 2 (two) times daily. 10/13/23   Georgie Chard D, NP  clopidogrel (PLAVIX) 75 MG tablet Take 1 tablet (75 mg total) by mouth daily. 10/12/23 11/11/23  Amin, Ankit C, MD  finasteride (PROSCAR) 5 MG tablet Take 5 mg by mouth every evening.    [provider]  rosuvastatin (CRESTOR) 40 MG tablet Take 40 mg by mouth daily.    [provider]      Allergies    Tetanus toxoids    Review of Systems   Review of Systems Negative except as per  HPI Physical Exam Updated Vital Signs BP 125/60   Pulse 79   Temp 98 F (36.7 C) (Oral)   Resp 16   Ht 5\' 3"  (1.6 m)   Wt 66.7 kg   SpO2 94%   BMI 26.04 kg/m  Physical Exam Vitals and nursing note reviewed.  Constitutional:      General: He is not in acute distress.    Appearance: He is well-developed. He is not diaphoretic.  HENT:     Head: Normocephalic and atraumatic.  Pulmonary:     Effort: Pulmonary effort is normal.  Abdominal:     Tenderness: There is abdominal tenderness.     Comments: Bladder distended. Blood in foley bag  Musculoskeletal:     Right lower leg: No edema.     Left lower leg: No edema.  Skin:    General: Skin is warm and dry.     Findings: No erythema or rash.  Neurological:     Mental Status: He is alert and oriented to person, place, and time.  Psychiatric:        Behavior: Behavior normal.     ED Results / Procedures / Treatments   Labs (all labs ordered are listed, but only abnormal results are displayed) Labs Reviewed  COMPREHENSIVE METABOLIC PANEL - Abnormal; Notable for the following components:      Result Value  Glucose, Bld 142 (*)    BUN 26 (*)    Calcium 8.7 (*)    Total Protein 6.0 (*)    Albumin 3.0 (*)    All other components within normal limits  CBC - Abnormal; Notable for the following components:   WBC 15.6 (*)    RBC 3.54 (*)    Hemoglobin 9.7 (*)    HCT 31.9 (*)    All other components within normal limits  URINALYSIS, ROUTINE W REFLEX MICROSCOPIC - Abnormal; Notable for the following components:   Color, Urine RED (*)    APPearance TURBID (*)    Glucose, UA   (*)    Value: TEST NOT REPORTED DUE TO COLOR INTERFERENCE OF URINE PIGMENT   Hgb urine dipstick   (*)    Value: TEST NOT REPORTED DUE TO COLOR INTERFERENCE OF URINE PIGMENT   Bilirubin Urine   (*)    Value: TEST NOT REPORTED DUE TO COLOR INTERFERENCE OF URINE PIGMENT   Ketones, ur   (*)    Value: TEST NOT REPORTED DUE TO COLOR INTERFERENCE OF URINE  PIGMENT   Protein, ur   (*)    Value: TEST NOT REPORTED DUE TO COLOR INTERFERENCE OF URINE PIGMENT   Nitrite   (*)    Value: TEST NOT REPORTED DUE TO COLOR INTERFERENCE OF URINE PIGMENT   Leukocytes,Ua   (*)    Value: TEST NOT REPORTED DUE TO COLOR INTERFERENCE OF URINE PIGMENT   All other components within normal limits  URINE CULTURE  LIPASE, BLOOD    EKG None  Radiology No results found.  Procedures Procedures    Medications Ordered in ED Medications  cefTRIAXone (ROCEPHIN) injection 1 g (has no administration in time range)    ED Course/ Medical Decision Making/ A&P                                 Medical Decision Making Amount and/or Complexity of Data Reviewed Labs: ordered.  Risk Prescription drug management.   This patient presents to the ED for concern of urinary retention, vomiting, this involves an extensive number of treatment options, and is a complaint that carries with it a high risk of complications and morbidity.  The differential diagnosis includes but not limited to hemorrhagic cystitis, urinary retention, AKI   Co morbidities that complicate the patient evaluation  HTN, HLD, enlarged prostate, CAD, CHF, CVA   Additional history obtained:  Additional history obtained from wife at bedside who contributes to history as above External records from outside source obtained and reviewed including prior labs on file, admission earlier this month with anemia with hgb 6.5, transfused.    Lab Tests:  I Ordered, and personally interpreted labs.  The pertinent results include:  CBC with leukocytosis at 15.6, hgb 9.7 (stable). Lipase WNL. CMP with elevated BUN, Cr stable. UA with gross hematuria, no bacteria, culture sent out.   Consultations Obtained:  I requested consultation with the ER attending, Dr. Blinda Leatherwood,  and discussed lab and imaging findings as well as pertinent plan - they recommend: agrees with plan of care   Problem List / ED Course /  Critical interventions / Medication management  77 yo male with indwelling foley catheter. Blood in foley bag, catheter not draining. Catheter replaced, 300cc gross blood output. Patient feels improved. Offered admission, patient would like to trial home. Vitals and labs stable. Provided with IM Rocephin and dc with  Keflex and zofran with return precautions and plan to call urology in the morning.  I ordered medication including rocephin  for possible UTI with indwelling foley  Reevaluation of the patient after these medicines showed that the patient stayed the same I have reviewed the patients home medicines and have made adjustments as needed   Social Determinants of Health:  Lives with spouse, has urologist for follow up   Test / Admission - Considered:  Stable for dc, offered admission. Patient would like to trial home.          Final Clinical Impression(s) / ED Diagnoses Final diagnoses:  Urinary retention  Gross hematuria  Acute cystitis with hematuria    Rx / DC Orders ED Discharge Orders          Ordered    cephALEXin (KEFLEX) 500 MG capsule  2 times daily        10/15/23 0609    ondansetron (ZOFRAN-ODT) 4 MG disintegrating tablet  Every 8 hours PRN        10/15/23 0609              Jeannie Fend, PA-C 10/15/23 4098    Gilda Crease, MD 10/15/23 [phone]

## 2023-10-15 NOTE — Telephone Encounter (Signed)
Patient contacted the cardiology after hours line to discuss bleeding coming from his urinary catheter that he has in place.  He states increase in urinary blood since coming home from the hospital this past Thursday.  Patient has a chronic indwelling catheter due to prostate hypertrophy.  I informed patient that he should come into emergency department for further evaluation versus contacting urology to discuss next steps.

## 2023-10-16 ENCOUNTER — Telehealth: Payer: Self-pay | Admitting: Internal Medicine

## 2023-10-16 NOTE — Telephone Encounter (Signed)
Pt states he is still seeing blood in his catheter and wants to speak with a nurse. Please advise

## 2023-10-16 NOTE — Progress Notes (Signed)
Patient ID: Amey Olden MRN: [id number] DOB/AGE: Sep 22, 1946 77 y.o.  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 male with the indicated medical history here for hospital follow-up and for recommendations regarding his mixed aortic valvular disease.  He was admitted to the hospital with acute on chronic diastolic heart failure thought caused by severe hematuria and severe anemia.  He is received multiple blood transfusions due to hemoglobin of less than 6.  He was planned to undergo laparoscopic prostatectomy however this was postponed given his admission with acute on chronic diastolic heart failure.  He underwent coronary angiography which demonstrated moderate left main lesion with an RFR of 0.83.  He was seen by cardiothoracic surgery and a TAVR protocol CTA was performed as well.  He was ultimately started on dual antiplatelet therapy and is here today to discuss treatment options.  Unfortunately he presented to the emergency department 3 days after discharge.  He noted gross hematuria and inability to pass urine.  His Foley catheter was irrigated and urine flow was restored.  He was discharged home.  His DAPT was stopped.  He continues to have hematuria but it is improving.  He had an episode of chest burning at rest.  He has had no reproducible exertional angina or exertional dyspnea.  He  has had some peripheral edema which has worsened since he was discharged from the hospital.  He reports a low-sodium diet and he is unsure exactly why this edema is occurring.  Past Medical History:  Diagnosis Date   BPH (benign prostatic hyperplasia)    Status post biopsy in 2009. 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 GR 1 DD. No regional wall motion abnormality. Moderate aortic stenosis with moderate regurgitation. Mean gradient 34 mmHg.    Family History  Problem Relation Age of Onset   Coronary artery disease Mother 29       22. Was diagnosed with a blood clot - suspect that this was a STEMI   Lung cancer Father    Thyroid disease Sister    Healthy Brother 42   Pancreatic cancer Sister 60   Cancer Maternal Grandmother    Heart disease Maternal Grandfather 1   Cancer Paternal  Grandmother    Stroke Paternal Grandfather     Social History   Socioeconomic History   Marital status: Married    Spouse name: Not on file   Number of children: 1   Years of education: Not on file   Highest education level: Not on file  Occupational History   Occupation: Retired    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  Tobacco Use   Smoking status:  Every Day    Current packs/day: 1.50    Average packs/day: 1.5 packs/day for 24.0 years (36.0 ttl pk-yrs)    Types: Cigarettes   Smokeless tobacco: Never  Substance and Sexual Activity   Alcohol use: Yes    Alcohol/week: 6.0 standard drinks of alcohol    Types: 6 Cans of beer per week   Drug use: No   Sexual activity: Not Currently  Other Topics Concern   Not on file  Social History Narrative   Married father of 1. Lives with his wife of 49 years. He is a retired Marine scientist used to work for [employer]   4 cups of coffee a day.    No structured exercise regimen   Social Determinants of Health   Financial Resource Strain: Not on file  Food Insecurity: No Food Insecurity (10/04/2023)   Hunger Vital Sign    Worried About Running Out of Food in the Last Year: Never true    Ran Out of Food in the Last Year: Never true  Transportation Needs: No Transportation Needs (10/04/2023)   PRAPARE - Administrator, Civil Service (Medical): No    Lack of Transportation (Non-Medical): No  Physical Activity: Not on file  Stress: Not on file  Social Connections: Not on file  Intimate Partner Violence: Not At Risk (10/04/2023)   Humiliation, Afraid, Rape, and Kick questionnaire    Fear of Current or Ex-Partner: No    Emotionally Abused: No    Physically Abused: No    Sexually Abused: No     Prior to Admission medications   Medication Sig Start Date End Date Taking? Authorizing Provider  amLODipine (NORVASC) 5 MG tablet Take 1 tablet (5 mg total) by mouth daily. 10/13/23   Miguel Rota, MD  aspirin EC 81 MG tablet Take 1 tablet (81 mg total) by mouth daily. Swallow whole. 10/12/23 11/11/23  Amin, Ankit C, MD  carvedilol (COREG) 3.125 MG tablet Take 1 tablet (3.125 mg total) by mouth 2 (two) times daily. 10/13/23   Filbert Schilder, NP  cephALEXin (KEFLEX) 500 MG capsule Take 1 capsule (500 mg total) by mouth 2 (two) times daily for 7 days. 10/15/23 10/22/23  Jeannie Fend, PA-C  clopidogrel (PLAVIX) 75 MG tablet Take 1 tablet (75 mg total) by mouth daily. 10/12/23 11/11/23  Amin, Ankit C, MD  finasteride (PROSCAR) 5 MG tablet Take 5 mg by mouth every evening.    [provider]  ondansetron (ZOFRAN-ODT) 4 MG disintegrating tablet Take 1 tablet (4 mg total) by mouth every 8 (eight) hours as needed for nausea or vomiting. 10/15/23   Jeannie Fend, PA-C  rosuvastatin (CRESTOR) 40 MG tablet Take 40 mg by mouth daily.    [provider]    Allergies  Allergen Reactions   Tetanus Toxoids     Childhood Allergy     REVIEW OF SYSTEMS:  General: no fevers/chills/night sweats Eyes: no blurry vision, diplopia, or amaurosis ENT: no sore throat or hearing loss Resp: no cough, wheezing, or hemoptysis  CV: no edema or palpitations GI: no abdominal pain, nausea, vomiting, diarrhea, or constipation GU: no dysuria, frequency, or hematuria Skin: no rash Neuro: no headache, numbness, tingling, or weakness of extremities Musculoskeletal: no joint pain or swelling Heme: no bleeding, DVT, or easy bruising Endo: no polydipsia or polyuria  BP (!) 96/48   Pulse 86   Ht 5\' 3"  (1.6 m)   Wt 149 lb 3.2 oz (67.7 kg)   SpO2 96%   BMI 26.43 kg/m   PHYSICAL EXAM: GEN:  AO x 3 in no acute distress HEENT: normal Dentition: Normal Neck: JVP normal. +2 carotid upstrokes without bruits. No thyromegaly. Lungs: equal expansion, clear bilaterally CV: Apex is discrete and nondisplaced, RRR with 2 out of 6 systolic murmur which radiates to the carotids Abd: soft, non-tender, non-distended; no bruit; positive bowel sounds Ext: +2 edema without ecchymoses, or cyanosis Vascular: 2+ femoral pulses, 2+ radial pulses       Skin: warm and dry without rash Neuro: CN II-XII grossly intact; motor and sensory grossly intact    DATA AND STUDIES:  EKG: EKG from October 2024 demonstrates sinus rhythm with right bundle branch block  EKG  Interpretation Date/Time:    Ventricular Rate:    PR Interval:    QRS Duration:    QT Interval:    QTC Calculation:   R Axis:      Text Interpretation:          Cardiac Studies & Procedures   CARDIAC CATHETERIZATION  CARDIAC CATHETERIZATION 10/09/2023  Narrative   Ost LM lesion is 50% stenosed.  1.  Moderate left main stenosis with an RFR of 0.83. 2.  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 3.  Fick cardiac output of 6.5 L/min and Fick cardiac index of 3.8 L/min/m with the following hemodynamics: Right atrial pressure mean of 7 Right ventricular pressure 42/-2 with an end-diastolic pressure of 8 mmHg Wedge pressure mean of 14 mmHg with V waves to 23 mmHg PA pressure of 38/16 with a mean of 24 mmHg PVR of 1.5 Woods units PA pulsatility index of 3.1  Recommendation: Continue evaluation for aortic valve intervention.  Will obtain TAVR protocol CTA tomorrow.  Findings Coronary Findings Diagnostic  Dominance: Right  Left Main Ost LM lesion is 50% stenosed.  Left Anterior Descending The vessel exhibits minimal luminal irregularities.  Ramus Intermedius There is mild diffuse disease throughout the vessel.  Right Coronary Artery There is moderate diffuse disease throughout the vessel.  Intervention  No interventions have been documented.     ECHOCARDIOGRAM  ECHOCARDIOGRAM COMPLETE 10/03/2023  Narrative ECHOCARDIOGRAM REPORT    Patient Name:   RAHJON JARRED Date of Exam: 10/03/2023 Medical Rec #:  [id number]  Height:       68.0 in Accession #:    [id number] Weight:       152.6 lb Date of Birth:  1946-08-25   BSA:          1.822 m Patient Age:    77 years   BP:           111/68 mmHg Patient Gender: M          HR:           94 bpm. [facility]  Procedure: 2D Echo, Cardiac Doppler and Color Doppler  Indications:    SBE  History:        Patient has prior history of Echocardiogram examinations, most recent 05/02/2018. Acute MI,  Arrythmias:RBBB, Signs/Symptoms:Chest  Pain and Shortness of Breath; Risk Factors:Hypertension, Dyslipidemia and Current Smoker.  Sonographer:    Mikki Harbor Referring Phys: [id number] SHERI HAMMOCK  IMPRESSIONS   1. Left ventricular ejection fraction, by estimation, is 55 to 60%. The left ventricle has normal function. The left ventricle has no regional wall motion abnormalities. There is mild left ventricular hypertrophy. Left ventricular diastolic parameters are indeterminate. Elevated left atrial pressure. 2. Right ventricular systolic function is normal. The right ventricular size is normal. There is mildly elevated pulmonary artery systolic pressure. 3. Left atrial size was moderately dilated. 4. Right atrial size was moderately dilated. 5. The mitral valve is abnormal. Mild to moderate mitral valve regurgitation. No evidence of mitral stenosis. 6. The tricuspid valve is abnormal. 7. The aortic valve was not well visualized. There is severe calcifcation of the aortic valve. There is severe thickening of the aortic valve. Aortic valve regurgitation is moderate to severe. Severe aortic valve stenosis. Severe aortic stenosis is present. Aortic valve mean gradient measures 42.0 mmHg. Aortic valve peak gradient measures 69.2 mmHg. Aortic valve area, by VTI measures 0.89 cm. 8. The inferior vena cava is dilated in size with >50% respiratory variability, suggesting right atrial pressure of 8 mmHg.  FINDINGS Left Ventricle: Left ventricular ejection fraction, by estimation, is 55 to 60%. The left ventricle has normal function. The left ventricle has no regional wall motion abnormalities. The left ventricular internal cavity size was normal in size. There is mild left ventricular hypertrophy. Left ventricular diastolic parameters are indeterminate. Elevated left atrial pressure.  Right Ventricle: The right ventricular size is normal. Right vetricular wall thickness was not well visualized.  Right ventricular systolic function is normal. There is mildly elevated pulmonary artery systolic pressure. The tricuspid regurgitant velocity is 3.02 m/s, and with an assumed right atrial pressure of 8 mmHg, the estimated right ventricular systolic pressure is 44.5 mmHg.  Left Atrium: Left atrial size was moderately dilated.  Right Atrium: Right atrial size was moderately dilated.  Pericardium: There is no evidence of pericardial effusion.  Mitral Valve: The mitral valve is abnormal. Mild mitral annular calcification. Mild to moderate mitral valve regurgitation. No evidence of mitral valve stenosis. MV peak gradient, 11.4 mmHg. The mean mitral valve gradient is 6.0 mmHg.  Tricuspid Valve: The tricuspid valve is abnormal. Tricuspid valve regurgitation is mild . No evidence of tricuspid stenosis.  Aortic Valve: The aortic valve was not well visualized. There is severe calcifcation of the aortic valve. There is severe thickening of the aortic valve. There is severe aortic valve annular calcification. Aortic valve regurgitation is moderate to severe. Aortic regurgitation PHT measures 227 msec. Severe aortic stenosis is present. Aortic valve mean gradient measures 42.0 mmHg. Aortic valve peak gradient measures 69.2 mmHg. Aortic valve area, by VTI measures 0.89 cm.  Pulmonic Valve: The pulmonic valve was not well visualized. Pulmonic valve regurgitation is not visualized. No evidence of pulmonic stenosis.  Aorta: The aortic root and ascending aorta are structurally normal, with no evidence of dilitation.  Venous: The inferior vena cava is dilated in size with greater than 50% respiratory variability, suggesting right atrial pressure of 8 mmHg.  IAS/Shunts: No atrial level shunt detected by color flow Doppler.   LEFT VENTRICLE PLAX 2D LVIDd:         4.80 cm      Diastology LVIDs:         3.20 cm      LV e' medial:    6.96 cm/s LV PW:  1.10 cm      LV E/e' medial:  18.8 LV IVS:         1.10 cm      LV e' lateral:   9.25 cm/s LVOT diam:     1.90 cm      LV E/e' lateral: 14.2 LV SV:         91 LV SV Index:   50 LVOT Area:     2.84 cm  LV Volumes (MOD) LV vol d, MOD A2C: 111.0 ml LV vol d, MOD A4C: 141.0 ml LV vol s, MOD A2C: 58.4 ml LV vol s, MOD A4C: 43.7 ml LV SV MOD A2C:     52.6 ml LV SV MOD A4C:     141.0 ml LV SV MOD BP:      76.4 ml  RIGHT VENTRICLE RV Basal diam:  3.80 cm RV Mid diam:    3.20 cm RV S prime:     15.30 cm/s TAPSE (M-mode): 2.2 cm  LEFT ATRIUM             Index        RIGHT ATRIUM           Index LA diam:        3.80 cm 2.09 cm/m   RA Area:     21.80 cm LA Vol (A2C):   93.7 ml 51.44 ml/m  RA Volume:   76.90 ml  42.22 ml/m LA Vol (A4C):   75.9 ml 41.67 ml/m LA Biplane Vol: 84.6 ml 46.44 ml/m AORTIC VALVE                     PULMONIC VALVE AV Area (Vmax):    0.93 cm      PV Vmax:       0.88 m/s AV Area (Vmean):   0.95 cm      PV Peak grad:  3.1 mmHg AV Area (VTI):     0.89 cm AV Vmax:           416.00 cm/s AV Vmean:          286.667 cm/s AV VTI:            1.020 m AV Peak Grad:      69.2 mmHg AV Mean Grad:      42.0 mmHg LVOT Vmax:         137.00 cm/s LVOT Vmean:        95.600 cm/s LVOT VTI:          0.320 m LVOT/AV VTI ratio: 0.31 AI PHT:            227 msec  AORTA Ao Root diam: 3.30 cm Ao Asc diam:  3.40 cm  MITRAL VALVE                TRICUSPID VALVE MV Area (PHT): 3.63 cm     TR Peak grad:   36.5 mmHg MV Peak grad:  11.4 mmHg    TR Vmax:        302.00 cm/s MV Mean grad:  6.0 mmHg MV Vmax:       1.68 m/s     SHUNTS MV Vmean:      111.0 cm/s   Systemic VTI:  0.32 m MV Decel Time: 209 msec     Systemic Diam: 1.90 cm MV E velocity: 131.00 cm/s MV A velocity: 130.00 cm/s MV E/A ratio:  1.01  Dina Rich MD Electronically signed by Dina Rich MD  Signature Date/Time: 10/03/2023/12:33:39 PM    Final    MONITORS  LONG TERM MONITOR (3-14 DAYS) 08/26/2020   CT SCANS  CT CORONARY MORPH W/CTA COR  W/SCORE 10/10/2023  Addendum 10/10/2023  4:16 PM ADDENDUM REPORT: 10/10/2023 16:14  EXAM: OVER-READ INTERPRETATION  CT CHEST  The following report is an over-read performed by radiologist Dr. Jacob Moores [facility] on 10/10/2023. This over-read does not include interpretation of cardiac or coronary anatomy or pathology. The cardiac TAVR interpretation by the cardiologist is attached.  COMPARISON:  None.  FINDINGS: Extracardiac findings will be described separately under dictation for contemporaneously obtained CTA chest, abdomen and pelvis.  IMPRESSION: Please see separate dictation for contemporaneously obtained CTA chest, abdomen and pelvis dated 10/10/2023 for full description of relevant extracardiac findings.   Electronically Signed By: Allegra Lai M.D. On: 10/10/2023 16:14  Narrative CLINICAL DATA:  68M with severe aortic stenosis being evaluated for a TAVR procedure.  EXAM: Cardiac TAVR CT  TECHNIQUE: The patient was scanned on a [employer]. A 120 kV retrospective scan was triggered in the descending thoracic aorta at 111 HU's. Gantry rotation speed was 250 msecs and collimation was .6 mm. No beta blockade or nitro were given. The 3D data set was reconstructed in 5% intervals of the R-R cycle. Systolic and diastolic phases were analyzed on a dedicated work station using MPR, MIP and VRT modes. The patient received 100 cc of contrast.  FINDINGS: Aortic Root:  Aortic valve: Trileaflet  Aortic valve calcium score: 3834  Aortic annulus:  Diameter: 29mm x 21mm  Perimeter: 78mm  Area: 454 mm^2  Calcifications: Mild calcification adjacent to LCC  Coronary height: Min Left - 16mm; Min Right - 24mm  Sinotubular height: Left cusp - 26mm; Right cusp - 28mm; Noncoronary cusp - 25mm  LVOT (as measured 3 mm below the annulus):  Diameter: 29mm x 21mm  Area: 440 mm^2  Calcifications: No calcifications  Aortic  sinus width: Left cusp - 32mm; Right cusp - 32mm; Noncoronary cusp - 34mm  Sinotubular junction width: 29mm x 27mm  Optimum Fluoroscopic Angle for Delivery: RAO 1 CAU 11  Cardiac:  Right atrium: Mild enlargement  Right ventricle: Normal size  Pulmonary arteries: Normal size  Pulmonary veins: Normal configuration  Left atrium: Normal size  Left ventricle: Normal size  Pericardium: Normal thickness  Coronary arteries: Calcium score 2379 (91st percentile)  IMPRESSION: 1. Trileaflet aortic valve with severe calcifications (AV calcium score 3834)  2. Aortic annulus measures 29mm x 21mm in diameter with perimeter 78mm and area 454 mm^2. Mild annular calcifications adjacent to left coronary cusp. Annular measurements are suitable for placement of a 26mm Edwards Sapien 3 valve.  3.  Sufficient coronary to annulus distance.  4.  Optimum Fluoroscopic Angle for Delivery:  RAO 1 CAU 11  5.  Coronary calcium score 2379 (91st percentile)  Electronically Signed: By: Epifanio Lesches M.D. On: 10/10/2023 15:38          10/04/2023: B Natriuretic Peptide 501.3 10/12/2023: Magnesium 2.1 10/15/2023: ALT 31; BUN 26; Creatinine, Ser 0.82; Hemoglobin 9.7; Platelets 227; Potassium 4.3; Sodium 137   STS RISK CALCULATOR: 3.17%  NHYA CLASS: 2    ASSESSMENT AND PLAN:   Severe aortic stenosis  Nonrheumatic aortic valve insufficiency  Coronary artery disease involving native coronary artery of native heart without angina pectoris  Essential hypertension  Dyslipidemia, goal LDL below 70  Bladder outlet obstruction  The patient has developed severe mixed aortic valvular disease and has  a hemodynamically significant left main stenosis.  Unfortunately he continues to develop hematuria trialed on dual antiplatelet therapy to determine whether a PCI plus TAVR strategy would be feasible.  This is clearly not the case.  The patient and his wife agree that CABG and AVR is the best  treatment modality moving forward.  We will arrange follow-up with cardiothoracic surgery.  I we will start him on Lasix 20 mg as needed for peripheral edema.  Given low blood pressures and ongoing hematuria we will stop Coreg.  The patient will remain off aspirin and Plavix.  He does have a history of stroke and aspirin therapy will be resumed in the future.  I have personally reviewed the patients imaging data as summarized above.  I have reviewed the natural history of aortic stenosis with the patient and family members who are present today. We have discussed the limitations of medical therapy and the poor prognosis associated with symptomatic aortic stenosis. We have also reviewed potential treatment options, including palliative medical therapy, conventional surgical aortic valve replacement, and transcatheter aortic valve replacement. We discussed treatment options in the context of this patient's specific comorbid medical conditions.   All of the patient's questions were answered today. Will make further recommendations based on the results of studies outlined above.   Total time spent with patient today 40 minutes. This includes reviewing records, evaluating the patient and coordinating care.   [name], MD  10/18/2023 10:00 AM    [facility] [address] [place], [place], [place]  [address] Phone: [phone]; Fax: [phone]

## 2023-10-16 NOTE — Telephone Encounter (Signed)
Left message to call back  Advised he call urology also

## 2023-10-17 ENCOUNTER — Other Ambulatory Visit: Payer: Self-pay | Admitting: *Deleted

## 2023-10-17 DIAGNOSIS — I350 Nonrheumatic aortic (valve) stenosis: Secondary | ICD-10-CM

## 2023-10-17 DIAGNOSIS — I351 Nonrheumatic aortic (valve) insufficiency: Secondary | ICD-10-CM

## 2023-10-17 DIAGNOSIS — I25110 Atherosclerotic heart disease of native coronary artery with unstable angina pectoris: Secondary | ICD-10-CM

## 2023-10-18 ENCOUNTER — Encounter: Payer: Self-pay | Admitting: Internal Medicine

## 2023-10-18 ENCOUNTER — Ambulatory Visit: Payer: Medicare Other | Attending: Internal Medicine | Admitting: Internal Medicine

## 2023-10-18 ENCOUNTER — Encounter: Payer: Self-pay | Admitting: *Deleted

## 2023-10-18 VITALS — BP 96/48 | HR 86 | Ht 63.0 in | Wt 149.2 lb

## 2023-10-18 DIAGNOSIS — I351 Nonrheumatic aortic (valve) insufficiency: Secondary | ICD-10-CM | POA: Diagnosis not present

## 2023-10-18 DIAGNOSIS — E785 Hyperlipidemia, unspecified: Secondary | ICD-10-CM | POA: Diagnosis not present

## 2023-10-18 DIAGNOSIS — I2510 Atherosclerotic heart disease of native coronary artery without angina pectoris: Secondary | ICD-10-CM

## 2023-10-18 DIAGNOSIS — I350 Nonrheumatic aortic (valve) stenosis: Secondary | ICD-10-CM | POA: Diagnosis not present

## 2023-10-18 DIAGNOSIS — N320 Bladder-neck obstruction: Secondary | ICD-10-CM

## 2023-10-18 DIAGNOSIS — I10 Essential (primary) hypertension: Secondary | ICD-10-CM

## 2023-10-18 DIAGNOSIS — D62 Acute posthemorrhagic anemia: Secondary | ICD-10-CM | POA: Diagnosis not present

## 2023-10-18 MED ORDER — FUROSEMIDE 20 MG PO TABS: 20.0000 mg | ORAL_TABLET | Freq: Every day | ORAL | 3 refills | Status: DC | PRN

## 2023-10-18 NOTE — Patient Instructions (Signed)
Medication Instructions:  Your physician has recommended you make the following change in your medication:  1.) stop carvedilol (Coreg) 2.) start furosemide (Lasix) 20 mg - one tablet daily AS NEEDED FOR SWELLING  *If you need a refill on your cardiac medications before your next appointment, please call your pharmacy*   Lab Work: none  Testing/Procedures: none   Follow-Up: At [facility], you and your health needs are our priority.  As part of our continuing mission to provide you with exceptional heart care, we have created designated Provider Care Teams.  These Care Teams include your primary Cardiologist (physician) and Advanced Practice Providers (APPs -  Physician Assistants and Nurse Practitioners) who all work together to provide you with the care you need, when you need it.   Your next appointment:   3 month(s)  Provider:   Alverda Skeans, MD

## 2023-10-19 ENCOUNTER — Telehealth: Payer: Self-pay | Admitting: *Deleted

## 2023-10-19 MED ORDER — PANTOPRAZOLE SODIUM 40 MG PO TBEC: 40.0000 mg | DELAYED_RELEASE_TABLET | Freq: Every day | ORAL | 11 refills | Status: DC

## 2023-10-19 MED ORDER — NITROGLYCERIN 0.4 MG SL SUBL: 0.4000 mg | SUBLINGUAL_TABLET | SUBLINGUAL | 3 refills | Status: AC | PRN

## 2023-10-19 NOTE — Telephone Encounter (Signed)
Received message that patient's wife reporting how he is just worn down/tired. She said at times he complains from reflux that comes and goes.   Per Dr. Lynnette Caffey "his chest discomfort was atypical. I do not think it is angina."  And advises that we can give him a ppi (Protonix 40 mg daily) and PRN NTG.   I called and spoke w patient's wife.  Advised of Dr. Trula Ore recommendations and that the chest discomfort sounds atypical but it is to be expected that he would be run down/worn out at this time.    Reviewed meds/uses/intended effects.  She said yesterday he felt sick, threw up and continues to have blood in the catheter.  Has Zofran prn and still on antibiotics.  Today again bleeding is less.    She is very appreciative and will call back if anything further is needed.

## 2023-10-21 LAB — URINE CULTURE: Culture: 10000 — AB

## 2023-10-22 ENCOUNTER — Telehealth (HOSPITAL_BASED_OUTPATIENT_CLINIC_OR_DEPARTMENT_OTHER): Payer: Self-pay | Admitting: *Deleted

## 2023-10-22 NOTE — Telephone Encounter (Signed)
Post ED Visit - Positive Culture Follow-up: Successful Patient Follow-Up  Culture assessed and recommendations reviewed by:  []  Enzo Bi, Pharm.D. []  Celedonio Miyamoto, Pharm.D., BCPS AQ-ID []  Garvin Fila, Pharm.D., BCPS []  Georgina Pillion, [address].D., BCPS []  [place], [place].D., BCPS, AAHIVP []  Estella Husk, Pharm.D., BCPS, AAHIVP []  Lysle Pearl, PharmD, BCPS []  Phillips Climes, PharmD, BCPS []  Agapito Games, PharmD, BCPS [x]  Ivery Quale,  PharmD  Positive urine culture  []  Patient discharged without antimicrobial prescription and treatment is now indicated [x]  Organism is resistant to prescribed ED discharge antimicrobial []  Patient with positive blood cultures  Changes discussed with ED provider: Alvino Blood, MD Spoke to pt. Advised to stop Cephalexin. Pt understands.   Contacted patient, date 10/22/23, time 9:02   Kyle Matthews 10/22/2023, 8:52 AM

## 2023-10-22 NOTE — Progress Notes (Signed)
ED Antimicrobial Stewardship Positive Culture Follow Up   Kyle Matthews is an 77 y.o. male who presented to [facility] on 10/15/2023 with a chief complaint of  Chief Complaint  Patient presents with   Emesis    Recent Results (from the past 720 hour(s))  MRSA Next Gen by PCR, Nasal     Status: None   Collection Time: 10/03/23  6:50 PM   Specimen: Nasal Mucosa; Nasal Swab  Result Value Ref Range Status   MRSA by PCR Next Gen NOT DETECTED NOT DETECTED Final    Comment: (NOTE) The GeneXpert MRSA Assay (FDA approved for NASAL specimens only), is one component of a comprehensive MRSA colonization surveillance program. It is not intended to diagnose MRSA infection nor to guide or monitor treatment for MRSA infections. Test performance is not FDA approved in patients less than 9 years old. Performed at [facility], [address] [place], [place] [address]   Resp panel by RT-PCR (RSV, Flu A&B, Covid)     Status: None   Collection Time: 10/04/23 11:28 AM  Result Value Ref Range Status   SARS Coronavirus 2 by RT PCR NEGATIVE NEGATIVE Final    Comment: (NOTE) SARS-CoV-2 target nucleic acids are NOT DETECTED.  The SARS-CoV-2 RNA is generally detectable in upper respiratory specimens during the acute phase of infection. The lowest concentration of SARS-CoV-2 viral copies this assay can detect is 138 copies/mL. A negative result does not preclude SARS-Cov-2 infection and should not be used as the sole basis for treatment or other patient management decisions. A negative result may occur with  improper specimen collection/handling, submission of specimen other than nasopharyngeal swab, presence of viral mutation(s) within the areas targeted by this assay, and inadequate number of viral copies(<138 copies/mL). A negative result must be combined with clinical observations, patient history, and epidemiological information. The expected result is Negative.  Fact Sheet for  Patients:  BloggerCourse.com  Fact Sheet for Healthcare Providers:  SeriousBroker.it  This test is no t yet approved or cleared by the [place] FDA and  has been authorized for detection and/or diagnosis of SARS-CoV-2 by FDA under an Emergency Use Authorization (EUA). This EUA will remain  in effect (meaning this test can be used) for the duration of the COVID-19 declaration under Section 564(b)(1) of the Act, 21 U.S.C.section 360bbb-3(b)(1), unless the authorization is terminated  or revoked sooner.       Influenza A by PCR NEGATIVE NEGATIVE Final   Influenza B by PCR NEGATIVE NEGATIVE Final    Comment: (NOTE) The Xpert Xpress SARS-CoV-2/FLU/RSV plus assay is intended as an aid in the diagnosis of influenza from Nasopharyngeal swab specimens and should not be used as a sole basis for treatment. Nasal washings and aspirates are unacceptable for Xpert Xpress SARS-CoV-2/FLU/RSV testing.  Fact Sheet for Patients: BloggerCourse.com  Fact Sheet for Healthcare Providers: SeriousBroker.it  This test is not yet approved or cleared by the [place] FDA and has been authorized for detection and/or diagnosis of SARS-CoV-2 by FDA under an Emergency Use Authorization (EUA). This EUA will remain in effect (meaning this test can be used) for the duration of the COVID-19 declaration under Section 564(b)(1) of the Act, 21 U.S.C. section 360bbb-3(b)(1), unless the authorization is terminated or revoked.     Resp Syncytial Virus by PCR NEGATIVE NEGATIVE Final    Comment: (NOTE) Fact Sheet for Patients: BloggerCourse.com  Fact Sheet for Healthcare Providers: SeriousBroker.it  This test is not yet approved or cleared by  the [employer] and has been authorized for detection and/or diagnosis of SARS-CoV-2 by FDA under an Emergency  Use Authorization (EUA). This EUA will remain in effect (meaning this test can be used) for the duration of the COVID-19 declaration under Section 564(b)(1) of the Act, 21 U.S.C. section 360bbb-3(b)(1), unless the authorization is terminated or revoked.  Performed at [facility], [address], [place], [place] [address]   Urine Culture     Status: Abnormal   Collection Time: 10/15/23  5:18 AM   Specimen: Urine, Catheterized  Result Value Ref Range Status   Specimen Description URINE, CATHETERIZED  Final   Special Requests   Final    NONE Performed at [facility], [address] [place], [place] [address]    Culture 10,000 COLONIES/mL ENTEROBACTER CLOACAE (A)  Final   Report Status 10/21/2023 FINAL  Final   Organism ID, Bacteria ENTEROBACTER CLOACAE (A)  Final      Susceptibility   Enterobacter cloacae - MIC*    CEFEPIME 2 SENSITIVE Sensitive     CIPROFLOXACIN <=0.25 SENSITIVE Sensitive     GENTAMICIN <=1 SENSITIVE Sensitive     IMIPENEM 0.5 SENSITIVE Sensitive     NITROFURANTOIN 256 RESISTANT Resistant     TRIMETH/SULFA <=20 SENSITIVE Sensitive     PIP/TAZO >=128 RESISTANT Resistant ug/mL    * 10,000 COLONIES/mL ENTEROBACTER CLOACAE    [x]  Treated with keflex, organism resistant to prescribed antimicrobial []  Patient discharged originally without antimicrobial agent and treatment is now indicated  Given urinary catheter exchanged and follow up planned with urology, no antibiotics at this time. Recommend calling patient to discontinue antibiotics.   ED Provider: Alvino Blood, MD   Kyle Matthews 10/22/2023, 8:42 AM Clinical Pharmacist Monday - Friday phone -  [phone] Saturday - Sunday phone - [phone]

## 2023-10-23 ENCOUNTER — Emergency Department (HOSPITAL_COMMUNITY): Payer: Medicare Other

## 2023-10-23 ENCOUNTER — Other Ambulatory Visit: Payer: Self-pay

## 2023-10-23 ENCOUNTER — Encounter (HOSPITAL_COMMUNITY): Payer: Self-pay | Admitting: Emergency Medicine

## 2023-10-23 ENCOUNTER — Inpatient Hospital Stay (HOSPITAL_COMMUNITY)
Admission: EM | Admit: 2023-10-23 | Discharge: 2023-11-08 | DRG: 981 | Disposition: A | Discharge status: Home Health | Payer: Medicare Other | Attending: Surgery | Admitting: Surgery

## 2023-10-23 DIAGNOSIS — D649 Anemia, unspecified: Secondary | ICD-10-CM | POA: Diagnosis not present

## 2023-10-23 DIAGNOSIS — E782 Mixed hyperlipidemia: Secondary | ICD-10-CM

## 2023-10-23 DIAGNOSIS — J449 Chronic obstructive pulmonary disease, unspecified: Secondary | ICD-10-CM | POA: Diagnosis not present

## 2023-10-23 DIAGNOSIS — Z800 Family history of malignant neoplasm of digestive organs: Secondary | ICD-10-CM

## 2023-10-23 DIAGNOSIS — E876 Hypokalemia: Secondary | ICD-10-CM | POA: Diagnosis not present

## 2023-10-23 DIAGNOSIS — N179 Acute kidney failure, unspecified: Secondary | ICD-10-CM | POA: Diagnosis not present

## 2023-10-23 DIAGNOSIS — I214 Non-ST elevation (NSTEMI) myocardial infarction: Secondary | ICD-10-CM | POA: Diagnosis present

## 2023-10-23 DIAGNOSIS — Z8679 Personal history of other diseases of the circulatory system: Secondary | ICD-10-CM

## 2023-10-23 DIAGNOSIS — I5033 Acute on chronic diastolic (congestive) heart failure: Secondary | ICD-10-CM | POA: Diagnosis not present

## 2023-10-23 DIAGNOSIS — I480 Paroxysmal atrial fibrillation: Secondary | ICD-10-CM | POA: Diagnosis not present

## 2023-10-23 DIAGNOSIS — R079 Chest pain, unspecified: Secondary | ICD-10-CM | POA: Diagnosis not present

## 2023-10-23 DIAGNOSIS — I959 Hypotension, unspecified: Secondary | ICD-10-CM | POA: Diagnosis not present

## 2023-10-23 DIAGNOSIS — N139 Obstructive and reflux uropathy, unspecified: Secondary | ICD-10-CM | POA: Diagnosis not present

## 2023-10-23 DIAGNOSIS — Z4682 Encounter for fitting and adjustment of non-vascular catheter: Secondary | ICD-10-CM | POA: Diagnosis not present

## 2023-10-23 DIAGNOSIS — J8410 Pulmonary fibrosis, unspecified: Secondary | ICD-10-CM | POA: Diagnosis not present

## 2023-10-23 DIAGNOSIS — R58 Hemorrhage, not elsewhere classified: Secondary | ICD-10-CM | POA: Diagnosis not present

## 2023-10-23 DIAGNOSIS — R0602 Shortness of breath: Secondary | ICD-10-CM | POA: Diagnosis not present

## 2023-10-23 DIAGNOSIS — Z887 Allergy status to serum and vaccine status: Secondary | ICD-10-CM

## 2023-10-23 DIAGNOSIS — K219 Gastro-esophageal reflux disease without esophagitis: Secondary | ICD-10-CM | POA: Diagnosis present

## 2023-10-23 DIAGNOSIS — I350 Nonrheumatic aortic (valve) stenosis: Secondary | ICD-10-CM | POA: Diagnosis not present

## 2023-10-23 DIAGNOSIS — I252 Old myocardial infarction: Secondary | ICD-10-CM | POA: Diagnosis not present

## 2023-10-23 DIAGNOSIS — I259 Chronic ischemic heart disease, unspecified: Secondary | ICD-10-CM

## 2023-10-23 DIAGNOSIS — R319 Hematuria, unspecified: Secondary | ICD-10-CM | POA: Diagnosis not present

## 2023-10-23 DIAGNOSIS — R3989 Other symptoms and signs involving the genitourinary system: Secondary | ICD-10-CM | POA: Diagnosis present

## 2023-10-23 DIAGNOSIS — N401 Enlarged prostate with lower urinary tract symptoms: Secondary | ICD-10-CM | POA: Diagnosis not present

## 2023-10-23 DIAGNOSIS — I080 Rheumatic disorders of both mitral and aortic valves: Secondary | ICD-10-CM | POA: Diagnosis present

## 2023-10-23 DIAGNOSIS — Z8349 Family history of other endocrine, nutritional and metabolic diseases: Secondary | ICD-10-CM

## 2023-10-23 DIAGNOSIS — J9601 Acute respiratory failure with hypoxia: Secondary | ICD-10-CM | POA: Diagnosis not present

## 2023-10-23 DIAGNOSIS — N400 Enlarged prostate without lower urinary tract symptoms: Secondary | ICD-10-CM | POA: Diagnosis present

## 2023-10-23 DIAGNOSIS — I517 Cardiomegaly: Secondary | ICD-10-CM | POA: Diagnosis not present

## 2023-10-23 DIAGNOSIS — Z9889 Other specified postprocedural states: Secondary | ICD-10-CM

## 2023-10-23 DIAGNOSIS — I700 Atherosclerosis of aorta: Secondary | ICD-10-CM | POA: Diagnosis present

## 2023-10-23 DIAGNOSIS — J811 Chronic pulmonary edema: Secondary | ICD-10-CM | POA: Diagnosis not present

## 2023-10-23 DIAGNOSIS — N3289 Other specified disorders of bladder: Secondary | ICD-10-CM | POA: Diagnosis not present

## 2023-10-23 DIAGNOSIS — Z952 Presence of prosthetic heart valve: Secondary | ICD-10-CM

## 2023-10-23 DIAGNOSIS — R310 Gross hematuria: Secondary | ICD-10-CM | POA: Diagnosis not present

## 2023-10-23 DIAGNOSIS — I5031 Acute diastolic (congestive) heart failure: Secondary | ICD-10-CM | POA: Diagnosis not present

## 2023-10-23 DIAGNOSIS — I441 Atrioventricular block, second degree: Secondary | ICD-10-CM | POA: Diagnosis not present

## 2023-10-23 DIAGNOSIS — J90 Pleural effusion, not elsewhere classified: Secondary | ICD-10-CM | POA: Diagnosis not present

## 2023-10-23 DIAGNOSIS — Z8673 Personal history of transient ischemic attack (TIA), and cerebral infarction without residual deficits: Secondary | ICD-10-CM

## 2023-10-23 DIAGNOSIS — Z809 Family history of malignant neoplasm, unspecified: Secondary | ICD-10-CM

## 2023-10-23 DIAGNOSIS — Z48812 Encounter for surgical aftercare following surgery on the circulatory system: Secondary | ICD-10-CM | POA: Diagnosis not present

## 2023-10-23 DIAGNOSIS — R338 Other retention of urine: Secondary | ICD-10-CM | POA: Diagnosis present

## 2023-10-23 DIAGNOSIS — I10 Essential (primary) hypertension: Secondary | ICD-10-CM

## 2023-10-23 DIAGNOSIS — N138 Other obstructive and reflux uropathy: Secondary | ICD-10-CM | POA: Diagnosis present

## 2023-10-23 DIAGNOSIS — Z951 Presence of aortocoronary bypass graft: Secondary | ICD-10-CM

## 2023-10-23 DIAGNOSIS — Z01818 Encounter for other preprocedural examination: Secondary | ICD-10-CM | POA: Diagnosis not present

## 2023-10-23 DIAGNOSIS — I4510 Unspecified right bundle-branch block: Secondary | ICD-10-CM | POA: Diagnosis present

## 2023-10-23 DIAGNOSIS — I5032 Chronic diastolic (congestive) heart failure: Secondary | ICD-10-CM

## 2023-10-23 DIAGNOSIS — E785 Hyperlipidemia, unspecified: Secondary | ICD-10-CM | POA: Diagnosis not present

## 2023-10-23 DIAGNOSIS — Z7982 Long term (current) use of aspirin: Secondary | ICD-10-CM

## 2023-10-23 DIAGNOSIS — N320 Bladder-neck obstruction: Secondary | ICD-10-CM | POA: Diagnosis present

## 2023-10-23 DIAGNOSIS — F17210 Nicotine dependence, cigarettes, uncomplicated: Secondary | ICD-10-CM | POA: Diagnosis not present

## 2023-10-23 DIAGNOSIS — Z79899 Other long term (current) drug therapy: Secondary | ICD-10-CM

## 2023-10-23 DIAGNOSIS — Z801 Family history of malignant neoplasm of trachea, bronchus and lung: Secondary | ICD-10-CM

## 2023-10-23 DIAGNOSIS — I110 Hypertensive heart disease with heart failure: Secondary | ICD-10-CM | POA: Diagnosis present

## 2023-10-23 DIAGNOSIS — Z452 Encounter for adjustment and management of vascular access device: Secondary | ICD-10-CM | POA: Diagnosis not present

## 2023-10-23 DIAGNOSIS — R9431 Abnormal electrocardiogram [ECG] [EKG]: Secondary | ICD-10-CM | POA: Diagnosis not present

## 2023-10-23 DIAGNOSIS — I352 Nonrheumatic aortic (valve) stenosis with insufficiency: Secondary | ICD-10-CM | POA: Diagnosis present

## 2023-10-23 DIAGNOSIS — R011 Cardiac murmur, unspecified: Secondary | ICD-10-CM | POA: Diagnosis not present

## 2023-10-23 DIAGNOSIS — Z7902 Long term (current) use of antithrombotics/antiplatelets: Secondary | ICD-10-CM

## 2023-10-23 DIAGNOSIS — J9811 Atelectasis: Secondary | ICD-10-CM | POA: Diagnosis not present

## 2023-10-23 DIAGNOSIS — I499 Cardiac arrhythmia, unspecified: Secondary | ICD-10-CM | POA: Diagnosis not present

## 2023-10-23 DIAGNOSIS — Z8249 Family history of ischemic heart disease and other diseases of the circulatory system: Secondary | ICD-10-CM

## 2023-10-23 DIAGNOSIS — Z823 Family history of stroke: Secondary | ICD-10-CM

## 2023-10-23 DIAGNOSIS — I256 Silent myocardial ischemia: Secondary | ICD-10-CM | POA: Diagnosis not present

## 2023-10-23 DIAGNOSIS — I5030 Unspecified diastolic (congestive) heart failure: Secondary | ICD-10-CM | POA: Diagnosis not present

## 2023-10-23 DIAGNOSIS — D62 Acute posthemorrhagic anemia: Principal | ICD-10-CM

## 2023-10-23 DIAGNOSIS — I351 Nonrheumatic aortic (valve) insufficiency: Secondary | ICD-10-CM | POA: Diagnosis not present

## 2023-10-23 DIAGNOSIS — R6889 Other general symptoms and signs: Secondary | ICD-10-CM | POA: Diagnosis not present

## 2023-10-23 DIAGNOSIS — R0989 Other specified symptoms and signs involving the circulatory and respiratory systems: Secondary | ICD-10-CM | POA: Diagnosis not present

## 2023-10-23 DIAGNOSIS — I2510 Atherosclerotic heart disease of native coronary artery without angina pectoris: Secondary | ICD-10-CM | POA: Diagnosis not present

## 2023-10-23 DIAGNOSIS — I21A1 Myocardial infarction type 2: Secondary | ICD-10-CM | POA: Diagnosis present

## 2023-10-23 DIAGNOSIS — Z01810 Encounter for preprocedural cardiovascular examination: Secondary | ICD-10-CM | POA: Diagnosis not present

## 2023-10-23 DIAGNOSIS — Z743 Need for continuous supervision: Secondary | ICD-10-CM | POA: Diagnosis not present

## 2023-10-23 DIAGNOSIS — L8990 Pressure ulcer of unspecified site, unspecified stage: Secondary | ICD-10-CM | POA: Insufficient documentation

## 2023-10-23 DIAGNOSIS — Z8701 Personal history of pneumonia (recurrent): Secondary | ICD-10-CM

## 2023-10-23 DIAGNOSIS — I2584 Coronary atherosclerosis due to calcified coronary lesion: Secondary | ICD-10-CM | POA: Diagnosis present

## 2023-10-23 DIAGNOSIS — R918 Other nonspecific abnormal finding of lung field: Secondary | ICD-10-CM | POA: Diagnosis not present

## 2023-10-23 DIAGNOSIS — R0789 Other chest pain: Secondary | ICD-10-CM | POA: Diagnosis not present

## 2023-10-23 LAB — CBC WITH DIFFERENTIAL/PLATELET
Abs Immature Granulocytes: 0.07 10*3/uL (ref 0.00–0.07)
Abs Immature Granulocytes: 0.06 10*3/uL (ref 0.00–0.07)
Basophils Absolute: 0 10*3/uL (ref 0.0–0.1)
Basophils Absolute: 0 10*3/uL (ref 0.0–0.1)
Basophils Relative: 0 %
Basophils Relative: 0 %
Eosinophils Absolute: 0 10*3/uL (ref 0.0–0.5)
Eosinophils Absolute: 0 10*3/uL (ref 0.0–0.5)
Eosinophils Relative: 0 %
Eosinophils Relative: 0 %
HCT: 17.4 % — ABNORMAL LOW (ref 39.0–52.0)
HCT: 26 % — ABNORMAL LOW (ref 39.0–52.0)
Hemoglobin: 5.2 g/dL — CL (ref 13.0–17.0)
Hemoglobin: 8.4 g/dL — ABNORMAL LOW (ref 13.0–17.0)
Immature Granulocytes: 1 %
Immature Granulocytes: 1 %
Lymphocytes Relative: 12 %
Lymphocytes Relative: 15 %
Lymphs Abs: 1.3 10*3/uL (ref 0.7–4.0)
Lymphs Abs: 1.5 10*3/uL (ref 0.7–4.0)
MCH: 28 pg (ref 26.0–34.0)
MCH: 29.4 pg (ref 26.0–34.0)
MCHC: 29.9 g/dL — ABNORMAL LOW (ref 30.0–36.0)
MCHC: 32.3 g/dL (ref 30.0–36.0)
MCV: 93.5 fL (ref 80.0–100.0)
MCV: 90.9 fL (ref 80.0–100.0)
Monocytes Absolute: 0.7 10*3/uL (ref 0.1–1.0)
Monocytes Absolute: 1.1 10*3/uL — ABNORMAL HIGH (ref 0.1–1.0)
Monocytes Relative: 6 %
Monocytes Relative: 11 %
Neutro Abs: 8.8 10*3/uL — ABNORMAL HIGH (ref 1.7–7.7)
Neutro Abs: 7.3 10*3/uL (ref 1.7–7.7)
Neutrophils Relative %: 81 %
Neutrophils Relative %: 73 %
Platelets: 211 10*3/uL (ref 150–400)
Platelets: 163 10*3/uL (ref 150–400)
RBC: 1.86 MIL/uL — ABNORMAL LOW (ref 4.22–5.81)
RBC: 2.86 MIL/uL — ABNORMAL LOW (ref 4.22–5.81)
RDW: 17.2 % — ABNORMAL HIGH (ref 11.5–15.5)
RDW: 15.9 % — ABNORMAL HIGH (ref 11.5–15.5)
WBC: 10.9 10*3/uL — ABNORMAL HIGH (ref 4.0–10.5)
WBC: 10 10*3/uL (ref 4.0–10.5)
nRBC: 0 % (ref 0.0–0.2)
nRBC: 0.6 % — ABNORMAL HIGH (ref 0.0–0.2)

## 2023-10-23 LAB — I-STAT CHEM 8, ED
BUN: 26 mg/dL — ABNORMAL HIGH (ref 8–23)
Calcium, Ion: 1.17 mmol/L (ref 1.15–1.40)
Chloride: 103 mmol/L (ref 98–111)
Creatinine, Ser: 1 mg/dL (ref 0.61–1.24)
Glucose, Bld: 118 mg/dL — ABNORMAL HIGH (ref 70–99)
HCT: 16 % — ABNORMAL LOW (ref 39.0–52.0)
Hemoglobin: 5.4 g/dL — CL (ref 13.0–17.0)
Potassium: 4.2 mmol/L (ref 3.5–5.1)
Sodium: 137 mmol/L (ref 135–145)
TCO2: 24 mmol/L (ref 22–32)

## 2023-10-23 LAB — COMPREHENSIVE METABOLIC PANEL
ALT: 18 U/L (ref 0–44)
ALT: 18 U/L (ref 0–44)
AST: 27 U/L (ref 15–41)
AST: 19 U/L (ref 15–41)
Albumin: 2.6 g/dL — ABNORMAL LOW (ref 3.5–5.0)
Albumin: 2.7 g/dL — ABNORMAL LOW (ref 3.5–5.0)
Alkaline Phosphatase: 37 U/L — ABNORMAL LOW (ref 38–126)
Alkaline Phosphatase: 37 U/L — ABNORMAL LOW (ref 38–126)
Anion gap: 7 (ref 5–15)
Anion gap: 10 (ref 5–15)
BUN: 21 mg/dL (ref 8–23)
BUN: 18 mg/dL (ref 8–23)
CO2: 24 mmol/L (ref 22–32)
CO2: 21 mmol/L — ABNORMAL LOW (ref 22–32)
Calcium: 8.2 mg/dL — ABNORMAL LOW (ref 8.9–10.3)
Calcium: 8.4 mg/dL — ABNORMAL LOW (ref 8.9–10.3)
Chloride: 106 mmol/L (ref 98–111)
Chloride: 105 mmol/L (ref 98–111)
Creatinine, Ser: 0.94 mg/dL (ref 0.61–1.24)
Creatinine, Ser: 0.9 mg/dL (ref 0.61–1.24)
GFR, Estimated: >60 mL/min (ref >60)
GFR, Estimated: >60 mL/min (ref >60)
Glucose, Bld: 124 mg/dL — ABNORMAL HIGH (ref 70–99)
Glucose, Bld: 106 mg/dL — ABNORMAL HIGH (ref 70–99)
Potassium: 4.2 mmol/L (ref 3.5–5.1)
Potassium: 4.1 mmol/L (ref 3.5–5.1)
Sodium: 137 mmol/L (ref 135–145)
Sodium: 136 mmol/L (ref 135–145)
Total Bilirubin: 0.4 mg/dL (ref <1.2)
Total Bilirubin: 1.2 mg/dL — ABNORMAL HIGH (ref <1.2)
Total Protein: 4.9 g/dL — ABNORMAL LOW (ref 6.5–8.1)
Total Protein: 5.1 g/dL — ABNORMAL LOW (ref 6.5–8.1)

## 2023-10-23 LAB — IRON AND TIBC
Iron: 137 ug/dL (ref 45–182)
Saturation Ratios: 30 % (ref 17.9–39.5)
TIBC: 461 ug/dL — ABNORMAL HIGH (ref 250–450)
UIBC: 324 ug/dL

## 2023-10-23 LAB — LIPID PANEL
Cholesterol: 122 mg/dL (ref 0–200)
HDL: 52 mg/dL (ref >40)
LDL Cholesterol: 55 mg/dL (ref 0–99)
Total CHOL/HDL Ratio: 2.3 {ratio}
Triglycerides: 74 mg/dL (ref <150)
VLDL: 15 mg/dL (ref 0–40)

## 2023-10-23 LAB — TROPONIN I (HIGH SENSITIVITY)
Troponin I (High Sensitivity): 369 ng/L (ref <18)
Troponin I (High Sensitivity): 482 ng/L (ref <18)
Troponin I (High Sensitivity): 1234 ng/L (ref <18)

## 2023-10-23 LAB — HEMOGLOBIN A1C
Hgb A1c MFr Bld: 4.7 % — ABNORMAL LOW (ref 4.8–5.6)
Mean Plasma Glucose: 88.19 mg/dL

## 2023-10-23 LAB — MAGNESIUM
Magnesium: 1.7 mg/dL (ref 1.7–2.4)
Magnesium: 1.7 mg/dL (ref 1.7–2.4)

## 2023-10-23 LAB — FERRITIN: Ferritin: 33 ng/mL (ref 24–336)

## 2023-10-23 LAB — APTT: aPTT: >200 s (ref 24–36)

## 2023-10-23 LAB — PROTIME-INR
INR: 1.3 — ABNORMAL HIGH (ref 0.8–1.2)
Prothrombin Time: 16 s — ABNORMAL HIGH (ref 11.4–15.2)

## 2023-10-23 LAB — I-STAT CG4 LACTIC ACID, ED: Lactic Acid, Venous: 1.3 mmol/L (ref 0.5–1.9)

## 2023-10-23 LAB — PREPARE RBC (CROSSMATCH)

## 2023-10-23 MED ORDER — FINASTERIDE 5 MG PO TABS
5.0000 mg | ORAL_TABLET | Freq: Every evening | ORAL | Status: DC
  Administered 2023-10-23 – 2023-11-07 (×16): 5 mg via ORAL
  Filled 2023-10-23 (×16): qty 1

## 2023-10-23 MED ORDER — MELATONIN 3 MG PO TABS
3.0000 mg | ORAL_TABLET | Freq: Every evening | ORAL | Status: DC | PRN
  Administered 2023-10-28: 3 mg via ORAL
  Filled 2023-10-23: qty 1

## 2023-10-23 MED ORDER — ACETAMINOPHEN 650 MG RE SUPP
650.0000 mg | Freq: Four times a day (QID) | RECTAL | Status: DC | PRN
Start: 2023-10-23 — End: 2023-11-02

## 2023-10-23 MED ORDER — HYDROMORPHONE HCL 1 MG/ML IJ SOLN: 0.5000 mg | Freq: Once | INTRAMUSCULAR | Status: DC

## 2023-10-23 MED ORDER — ASPIRIN 81 MG PO CHEW
324.0000 mg | CHEWABLE_TABLET | Freq: Once | ORAL | Status: AC
Start: 2023-10-23 — End: 2023-10-23
  Administered 2023-10-23: 324 mg via ORAL
  Filled 2023-10-23: qty 4

## 2023-10-23 MED ORDER — FUROSEMIDE 10 MG/ML IJ SOLN
20.0000 mg | Freq: Once | INTRAMUSCULAR | Status: AC
Start: 2023-10-23 — End: 2023-10-23
  Administered 2023-10-23: 20 mg via INTRAVENOUS
  Filled 2023-10-23: qty 2

## 2023-10-23 MED ORDER — FENTANYL CITRATE PF 50 MCG/ML IJ SOSY: 50.0000 ug | PREFILLED_SYRINGE | Freq: Once | INTRAMUSCULAR | Status: DC

## 2023-10-23 MED ORDER — MORPHINE SULFATE (PF) 2 MG/ML IV SOLN: 2.0000 mg | INTRAVENOUS | Status: DC | PRN

## 2023-10-23 MED ORDER — ACETAMINOPHEN 325 MG PO TABS
650.0000 mg | ORAL_TABLET | Freq: Four times a day (QID) | ORAL | Status: DC | PRN
Start: 2023-10-23 — End: 2023-11-02
  Administered 2023-10-23 – 2023-10-30 (×4): 650 mg via ORAL
  Filled 2023-10-23 (×5): qty 2

## 2023-10-23 MED ORDER — PANTOPRAZOLE SODIUM 40 MG PO TBEC
40.0000 mg | DELAYED_RELEASE_TABLET | Freq: Every day | ORAL | Status: DC
  Administered 2023-10-23 – 2023-11-01 (×9): 40 mg via ORAL
  Filled 2023-10-23 (×9): qty 1

## 2023-10-23 MED ORDER — OXYCODONE HCL 5 MG PO TABS
5.0000 mg | ORAL_TABLET | ORAL | Status: DC | PRN
  Filled 2023-10-23: qty 1

## 2023-10-23 MED ORDER — ALUM & MAG HYDROXIDE-SIMETH 200-200-20 MG/5ML PO SUSP
30.0000 mL | Freq: Once | ORAL | Status: AC
  Administered 2023-10-23: 30 mL via ORAL
  Filled 2023-10-23: qty 30

## 2023-10-23 MED ORDER — SODIUM CHLORIDE 0.9% IV SOLUTION
Freq: Once | INTRAVENOUS | Status: DC
Start: 2023-10-23 — End: 2023-10-29

## 2023-10-23 MED ORDER — FUROSEMIDE 10 MG/ML IJ SOLN
20.0000 mg | Freq: Once | INTRAMUSCULAR | Status: AC
  Administered 2023-10-23: 20 mg via INTRAVENOUS
  Filled 2023-10-23: qty 2

## 2023-10-23 MED ORDER — ONDANSETRON HCL 4 MG/2ML IJ SOLN
4.0000 mg | Freq: Four times a day (QID) | INTRAMUSCULAR | Status: DC | PRN
  Administered 2023-10-26 – 2023-10-28 (×2): 4 mg via INTRAVENOUS
  Filled 2023-10-23 (×2): qty 2

## 2023-10-23 MED ORDER — DEGARELIX ACETATE(240 MG DOSE) 120 MG/VIAL ~~LOC~~ SOLR
240.0000 mg | Freq: Once | SUBCUTANEOUS | Status: AC
  Administered 2023-10-23: 240 mg via SUBCUTANEOUS
  Filled 2023-10-23: qty 6

## 2023-10-23 MED ORDER — HEPARIN SODIUM (PORCINE) 5000 UNIT/ML IJ SOLN
60.0000 [IU]/kg | Freq: Once | INTRAMUSCULAR | Status: AC
Start: 2023-10-23 — End: 2023-10-23
  Administered 2023-10-23: 4000 [IU] via INTRAVENOUS
  Filled 2023-10-23: qty 1

## 2023-10-23 MED ORDER — SODIUM CHLORIDE 0.9 % IV SOLN: INTRAVENOUS | Status: AC

## 2023-10-23 MED ORDER — ROSUVASTATIN CALCIUM 20 MG PO TABS
40.0000 mg | ORAL_TABLET | Freq: Every day | ORAL | Status: DC
  Administered 2023-10-23 – 2023-11-08 (×15): 40 mg via ORAL
  Filled 2023-10-23 (×16): qty 2

## 2023-10-23 MED ORDER — CHLORHEXIDINE GLUCONATE CLOTH 2 % EX PADS
6.0000 | MEDICATED_PAD | Freq: Every day | CUTANEOUS | Status: DC
  Administered 2023-10-23 – 2023-11-02 (×11): 6 via TOPICAL

## 2023-10-23 MED ORDER — CALCIUM GLUCONATE-NACL 1-0.675 GM/50ML-% IV SOLN
1.0000 g | Freq: Once | INTRAVENOUS | Status: AC
  Administered 2023-10-23: 1000 mg via INTRAVENOUS

## 2023-10-23 NOTE — H&P (Signed)
History and Physical      Kyle Matthews ION:[id number] DOB: Jun 19, 1946 DOA: 10/23/2023; DOS: 10/23/2023  PCP: Kyle Palmer, MD  Patient coming from: home   I have personally briefly reviewed patient's old medical records in [facility]  Chief Complaint: Chest pain  HPI: Kyle Matthews is a 77 y.o. male with medical history significant for severe aortic stenosis, BPH with recurrent gross hematuria, chronic blood loss anemia associated baseline hemoglobin 7.5-10, who is admitted to [facility] on 10/23/2023 with NSTEMI after presenting from home to [facility] ED complaining of chest pain.   The patient was recently hospitalized at [facility] from 10/03/2023 to 10/12/2023 with type II NSTEMI in the setting of gross materia resulting in acute on chronic blood loss anemia.  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  His peak high-sensitivity troponin I during his previous hospitalization was noted to be 4800 on 10/03/2023.  So has a history of severe aortic stenosis, with most recent echocardiogram performed on 10/03/2023, which showed LVEF 55 to 60%, no evidence of focal wall motion device, indeterminate diastolic parameters, mild LVH, normal right ventricular systolic function, moderately dilated bilateral atria, mild to moderate mitral regurgitation and severe aortic stenosis with aortic valve area of 0.89 cm2 by via continuity equation using VTI.   The patient has been following with urology for his recurrent gross hematuria in the setting of BPH.  Urology reportedly is ultimately planning for prostatectomy, would like the patient to be optimized first from a cardiology standpoint including from the standpoint of the patient's left main disease as well as severe aortic stenosis.   In the setting of his current gross hematuria, cardiology the patient's daily baby aspirin and Plavix 1 week ago.  Following this measure,  the patient conveys that his gross hematuria has completely resolved over the last 2 days, and denies any recent melena or hematochezia.  However, around 2300 on 10/22/2023, the patient developed new onset substernal chest pressure, associate with nausea/vomiting in the absence of shortness of breath, palpitations, diaphoresis dizziness, presyncope, or syncope.  Did not take any nitroglycerin for this discomfort, and also did not ambulate or otherwise exert himself following onset of this chest discomfort.  Was brought to [facility] emergency department for further evaluation management thereof.  The patient reports significant improvement in the intensity of his chest discomfort following initiation of PRBC transfusion in the ED, as further outlined below and denies any residual nausea at this time.    ED Course:  Vital signs in the ED were notable for the following: Afebrile; heart rates in the 80s to low 100s; systolic blood pressures in the 80s to low 100s; respiratory rate 20-24, oxygen saturation 95 to 100% on room air.  Labs were notable for the following: CMP notable for the following: Potassium 4.2, creatinine 0.94, calcium, adjusted for mild hyperlipidemia noted to be 9.4, avidin 2.6, liver enzymes within limits.  High-sensitivity troponin I noted to be 369, relative to most recent prior high sensitive troponin I value of 3357 on 10/04/2023, with repeat high-sensitivity troponin I value currently pending.  CBC notable for white blood cell count 10,900 down from 15,600 on 10/14/2023, hemoglobin 5.2 associated with normocytic property as well as hypochromic RDW, which is relative to most recent prior hemoglobin data point of 9.7 on 1025 724, platelet count 211.  Type and screen completed.  Per my interpretation, EKG in ED demonstrated the following:  In comparison to most recent prior EKG performed on 10/13/2023, this evening's EKG performed at the time the patient was experiencing chest  discomfort showed sinus rhythm with PACs, heart rate 88, normal intervals, T wave version in lead III, which appears unchanged from most recent prior EKG, also showing ST depression in leads II and aVF, which appear new relative to most recent prior EKG, will also showing ST depression in leads V4, V5, and V6, which were also noted to be present on most recent prior EKG, but now appear more pronounced.  No evidence of associated ST elevation on this evening's EKG.  Imaging in the ED, per corresponding formal radiology read, was notable for the following: 1 view chest x-ray shows stable cardiomegaly, without evidence of infiltrate, edema, effusion, or pneumothorax.  EDP discussed patient's case with on-call cardiology, who are formally consulting, and confirmed that the patient does not need an urgent cath, but rather needs PRBC transfusion to address the type II supply/demand mismatch component of today's presentation.    While in the ED, the following were administered: Prior to dissemination of the patient's history of Kyle Matthews hematuria and acute on chronic blood loss, the patient received a full dose aspirin.  He also received Lasix 20 mg IV x 1 dose, calcium gluconate 1 g IV, and initiation of 3 units PRBC.  Subsequently, the patient was admitted for further evaluation and management of presenting NSTEMI as consequence of type II supply/demand mismatch in the context of acute on chronic blood loss anemia after presenting with chest discomfort.     Review of Systems: As per HPI otherwise 10 point review of systems negative.   Past Medical History:  Diagnosis Date   BPH (benign prostatic hyperplasia)    Status post biopsy in 2009. 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   Stroke (HCC) 2007   No true etiology noted     Past Surgical History:  Procedure Laterality Date   CORONARY PRESSURE/FFR STUDY N/A 10/09/2023   Procedure: CORONARY PRESSURE/FFR STUDY;  Surgeon: [name], MD;  Location: [facility] INVASIVE CV LAB;  Service: Cardiovascular;  Laterality: N/A;   RIGHT/LEFT HEART CATH AND CORONARY ANGIOGRAPHY N/A 10/09/2023   Procedure: RIGHT/LEFT HEART CATH AND CORONARY ANGIOGRAPHY;  Surgeon: [name], MD;  Location: [facility] INVASIVE CV LAB;  Service: Cardiovascular;  Laterality: N/A;   TRANSTHORACIC ECHOCARDIOGRAM  04/28/2017    EF 65-70%. GR 1 DD. No regional wall motion abnormality. Moderate aortic stenosis with moderate regurgitation. Mean gradient 34 mmHg.    Social History:  reports that he has been smoking cigarettes. He has a 36 pack-year smoking history. He has never used smokeless tobacco. He reports current alcohol use of about 6.0 standard drinks of alcohol per week. He reports that he does not use drugs.   Allergies  Allergen Reactions   Tetanus Toxoids     Childhood Allergy     Family History  Problem Relation Age of Onset   Coronary artery disease Mother 14       39. Was diagnosed with a blood clot - suspect that this was a STEMI   Lung cancer Father    Thyroid disease Sister    Healthy Brother 24   Pancreatic cancer Sister 60   Cancer Maternal Grandmother  Heart disease Maternal Grandfather 71   Cancer Paternal Grandmother    Stroke Paternal Grandfather     Family history reviewed and not pertinent    Prior to Admission medications   Medication Sig Start Date End Date Taking? Authorizing Provider  amLODipine (NORVASC) 5 MG tablet Take 1 tablet (5 mg total) by mouth daily. 10/13/23  Yes Amin, Ankit C, MD  aspirin EC 81 MG tablet Take 1 tablet (81 mg total) by mouth daily. Swallow whole. 10/12/23 11/11/23 Yes Amin, Ankit C, MD  clopidogrel (PLAVIX) 75 MG tablet Take 1 tablet (75 mg total) by mouth daily. 10/12/23 11/11/23 Yes Amin, Ankit C, MD  finasteride (PROSCAR)  5 MG tablet Take 5 mg by mouth every evening.   Yes [provider]  furosemide (LASIX) 20 MG tablet Take 1 tablet (20 mg total) by mouth daily as needed. 10/18/23  Yes [name], MD  nitroGLYCERIN (NITROSTAT) 0.4 MG SL tablet Place 1 tablet (0.4 mg total) under the tongue every 5 (five) minutes as needed for chest pain. 10/19/23  Yes [name], MD  ondansetron (ZOFRAN-ODT) 4 MG disintegrating tablet Take 1 tablet (4 mg total) by mouth every 8 (eight) hours as needed for nausea or vomiting. 10/15/23  Yes Jeannie Fend, PA-C  pantoprazole (PROTONIX) 40 MG tablet Take 1 tablet (40 mg total) by mouth daily. 10/19/23  Yes [name], MD  rosuvastatin (CRESTOR) 40 MG tablet Take 40 mg by mouth daily.   Yes [provider]     Objective    Physical Exam: Vitals:   10/23/23 0350 10/23/23 0400 10/23/23 0405 10/23/23 0415  BP: (!) 82/56 (!) 80/50 (!) 94/53 (!) 100/54  Pulse: 95 87 89 97  Resp: (!) 22 (!) 25 (!) 25 (!) 26  Temp:      TempSrc:      SpO2: 95% 97% 97% 97%  Weight:      Height:        General: appears to be stated age; alert, oriented Skin: warm, dry, no rash Head:  AT/[place] Mouth:  Oral mucosa membranes appear moist, normal dentition Neck: supple; trachea midline Heart:  RRR; did not appreciate any M/R/G Lungs: CTAB, did not appreciate any wheezes, rales, or rhonchi Abdomen: + BS; soft, ND, NT Vascular: 2+ pedal pulses b/l; 2+ radial pulses b/l Extremities: no peripheral edema, no muscle wasting Neuro: strength and sensation intact in upper and lower extremities b/l     Labs on Admission: I have personally reviewed following labs and imaging studies  CBC: Recent Labs  Lab 10/23/23 0129 10/23/23 0131  WBC 10.9*  --   NEUTROABS 8.8*  --   HGB 5.2* 5.4*  HCT 17.4* 16.0*  MCV 93.5  --   PLT 211  --    Basic Metabolic Panel: Recent Labs  Lab 10/23/23 0129 10/23/23 0131  NA 137 137  K 4.2 4.2  CL 106 103  CO2 24  --    GLUCOSE 124* 118*  BUN 21 26*  CREATININE 0.94 1.00  CALCIUM 8.2*  --    GFR: Estimated Creatinine Clearance: 51.4 mL/min (by C-G formula based on SCr of 1 mg/dL). Liver Function Tests: Recent Labs  Lab 10/23/23 0129  AST 27  ALT 18  ALKPHOS 37*  BILITOT 0.4  PROT 4.9*  ALBUMIN 2.6*   No results for input(s): "LIPASE", "AMYLASE" in the last 168 hours. No results for input(s): "AMMONIA" in the last 168 hours. Coagulation Profile: Recent Labs  Lab 10/23/23 0129  INR 1.3*   Cardiac Enzymes: No results for input(s): "CKTOTAL", "CKMB", "CKMBINDEX", "TROPONINI" in the last 168 hours. BNP (last 3 results) No results for input(s): "PROBNP" in the last 8760 hours. HbA1C: Recent Labs    10/23/23 0129  HGBA1C 4.7*   CBG: No results for input(s): "GLUCAP" in the last 168 hours. Lipid Profile: Recent Labs    10/23/23 0129  CHOL 122  HDL 52  LDLCALC 55  TRIG 74  CHOLHDL 2.3   Thyroid Function Tests: No results for input(s): "TSH", "T4TOTAL", "FREET4", "T3FREE", "THYROIDAB" in the last 72 hours. Anemia Panel: No results for input(s): "VITAMINB12", "FOLATE", "FERRITIN", "TIBC", "IRON", "RETICCTPCT" in the last 72 hours. Urine analysis:    Component Value Date/Time   COLORURINE RED (A) 10/15/2023 0501   APPEARANCEUR TURBID (A) 10/15/2023 0501   LABSPEC  10/15/2023 0501    TEST NOT REPORTED DUE TO COLOR INTERFERENCE OF URINE PIGMENT   PHURINE  10/15/2023 0501    TEST NOT REPORTED DUE TO COLOR INTERFERENCE OF URINE PIGMENT   GLUCOSEU (A) 10/15/2023 0501    TEST NOT REPORTED DUE TO COLOR INTERFERENCE OF URINE PIGMENT   HGBUR (A) 10/15/2023 0501    TEST NOT REPORTED DUE TO COLOR INTERFERENCE OF URINE PIGMENT   BILIRUBINUR (A) 10/15/2023 0501    TEST NOT REPORTED DUE TO COLOR INTERFERENCE OF URINE PIGMENT   KETONESUR (A) 10/15/2023 0501    TEST NOT REPORTED DUE TO COLOR INTERFERENCE OF URINE PIGMENT   PROTEINUR (A) 10/15/2023 0501    TEST NOT REPORTED DUE TO COLOR  INTERFERENCE OF URINE PIGMENT   NITRITE (A) 10/15/2023 0501    TEST NOT REPORTED DUE TO COLOR INTERFERENCE OF URINE PIGMENT   LEUKOCYTESUR (A) 10/15/2023 0501    TEST NOT REPORTED DUE TO COLOR INTERFERENCE OF URINE PIGMENT    Radiological Exams on Admission: DG Chest Port 1 View  Result Date: 10/23/2023 CLINICAL DATA:  Chest pain EXAM: PORTABLE CHEST 1 VIEW COMPARISON:  10/05/2023 FINDINGS: The lungs are symmetrically well expanded. There is progressive central pulmonary vascular congestion without superimposed overt pulmonary edema. Mild cardiomegaly is stable. No confluent pulmonary infiltrate. No pneumothorax or pleural effusion. No acute bone abnormality. IMPRESSION: 1. Progressive central pulmonary vascular congestion without superimposed overt pulmonary edema. Stable cardiomegaly. Electronically Signed   By: Helyn Numbers M.D.   On: 10/23/2023 01:45      Assessment/Plan   Principal Problem:   NSTEMI (non-ST elevated myocardial infarction) [facility]) Active Problems:   Acute on chronic blood loss anemia   Severe aortic stenosis   BPH (benign prostatic hyperplasia)   Hyperlipidemia   Chest pain   History of essential hypertension   GERD (gastroesophageal reflux disease)     #) NSTEMI: Diagnosis on the basis of presenting chest discomfort with mildly elevated at 369, which is about 1/10 of most recent prior high-sensitivity troponin I data point from 10/04/2023, while presenting EKG shows new/more pronounced ST depression, as further described above, in the absence of any ST elevation.  This appears to be most consistent with type II supply/demand mismatch as a consequence of presenting acute on chronic blood loss anemia superimposed on left main disease as opposed to representing an back rupture.  This appears to be further substantiated by improving chest pain with initiation of PRBC transfusion.  EDP discussed patient's case with on-call cardiology, who are formally consulting, and  confirmed that the patient does not need an urgent cath, but rather needs PRBC transfusion to  address the type II supply/demand mismatch component of today's presentation.    Given presenting blood loss anemia is a drop leading to this evening's type II supply/demand manage NSTEMI, will refrain from MI and also refrain from additional antiplatelet medications for now.  It is noted that the patient did initially receive a full dose aspirin in the emergency department this evening.  Plan: Resume outpatient high intensity rosuvastatin.  Monitor on telemetry.  Add on serum magnesium level.  Continue to trend troponin.  Cardiology to formally consult.  Further evaluation management of acute on chronic blood loss anemia in setting of recent gross hematuria, including continuation of transfusion of 3 units PRBC ordered in the ED this evening.  Acute 4-hour H&H's ordered through 1600 today.              #) Acute on chronic blood loss anemia: In the setting of history of chronic blood loss anemia associated with recurrent gross hematuria stemming from complication of BPH, with baseline hemoglobin range noted to be 7.5-10, presenting hemoglobin found to be 5.2, with hypochromic findings as well as RDW elevation consistent with blood loss anemia.  It is noted that the patient conveys that his gross pneumaturia result 2 days ago after his dual antiplatelet therapy was stopped by cardiology 1 week ago.  No evidence of additional blood loss at this time, including no evidence to suggest acute gastrointestinal blood loss.  Type and screen completed in the ED today, and 3 units PRBC were initiated in the ED.  Will also add on iron studies to pretransfusion specimen to see if patient will also benefit from iron infusion in addition to aforementioned PRBC transfusion.  It is noted that urology ultimately plans for prostatectomy to address his recurrent hematuria in setting of BPH, but that they would prefer for  cardiac optimization from the standpoint of the patient's as well as severe aortic stenosis.  Plan: Continue to hold outpatient dual antiplatelet therapy.  Transfusion of 3 units PRBC, as ordered in the ED.  Every 4 hour H&H's ordered through 1600 today.  Add on iron studies.  Monitor on telemetry.  Refraining from pharmacologic DVT prophylaxis.  SCDs.                #) Severe aortic stenosis: Documented history of such, with most recent echocardiogram on 10/03/2023 showing aortic valve area of 0.89 cm2 via continuity equation using VTI.  Following with cardiology for surgery versus TAVR.  Patient severe stenosis, there is conveyance of both preload and afterload dependent physiology, which is notable in the setting of the patient's acute on chronic blood loss anemia as well as his presenting chest pain.  Will, will refrain from sublingual nitroglycerin for his chest pain.   Plan: Cardiology to formally consult, as above.  PRBC transfusion, as of.  Monitor strict I's and O's and daily weights.  Monitor on telemetry.  Refraining from sublingual nitroglycerin.                 #) Benign Prostatic Hyperplasia:  documented h/o such; on Proscar as outpatient and complicated by recurrent gross materia, with resolution of gross materia for the last 2 days after discontinuation of dual antiplatelet therapy 1 week ago, as further described above.  Follows with urology as an outpatient, however ultimately planning on prostatectomy after cardiac optimization, as further detailed above.  Plan: monitor strict I's & O's and daily weights. Repeat CMP in AM.  Continue outpatient Proscar.                  #)  History of essential Hypertension: documented h/o such, with outpatient antihypertensive regimen including Norvasc.  SBP's in the ED today: 80s to 100s mmHg, improving with initiation of PRBC transfusion in the context of presenting acute on chronic blood loss anemia.  In  the setting, will hold home Norvasc for now.  Plan: Close monitoring of subsequent BP via routine VS. holding Norvasc for now.                   #) Hyperlipidemia: documented h/o such. On high intensity rosuvastatin as outpatient.   Plan: continue home statin.                    #) GERD: documented h/o such; on Protonix as outpatient.   Plan: continue home PPI.        DVT prophylaxis: SCD's   Code Status: Full code Family Communication: none Disposition Plan: Per Rounding Team Consults called: EDP d/w on-call cardiology fellow, who will formally consult, as further detailed above;  Admission status: Inpatient     I SPENT GREATER THAN 75  MINUTES IN CLINICAL CARE TIME/MEDICAL DECISION-MAKING IN COMPLETING THIS ADMISSION.      Chaney Born Nghia Mcentee DO [facility]  From 7PM - 7AM   10/23/2023, 4:28 AM

## 2023-10-23 NOTE — ED Notes (Signed)
Cardiology at bedside. Code stemi cancelled.

## 2023-10-23 NOTE — Plan of Care (Signed)
  Problem: Education: Goal: Knowledge of General Education information will improve Description Including pain rating scale, medication(s)/side effects and non-pharmacologic comfort measures Outcome: Progressing   

## 2023-10-23 NOTE — ED Notes (Signed)
Patient receiving blood at time of second troponin due.  Will draw after transfusion

## 2023-10-23 NOTE — ED Notes (Signed)
Patient reports he is feeling better at this time.

## 2023-10-23 NOTE — Progress Notes (Signed)
PROGRESS NOTE    Kyle Matthews  XLK:[id number] DOB: Mar 19, 1946 DOA: 10/23/2023 PCP: Kyle Palmer, MD  Chief Complaint  Patient presents with   Chest Pain    Brief Narrative:   Kyle Matthews is Kyle Matthews 77 y.o. male with medical history significant for severe aortic stenosis, BPH with recurrent gross hematuria, chronic blood loss anemia associated baseline hemoglobin 7.5-10, who is admitted to [facility] on 10/23/2023 with NSTEMI after presenting from home to [facility] ED complaining of chest pain.   He's been admitted with acute blood loss anemia.    Assessment & Plan:   Principal Problem:   NSTEMI (non-ST elevated myocardial infarction) (HCC) Active Problems:   Acute on chronic blood loss anemia   Severe aortic stenosis   BPH (benign prostatic hyperplasia)   Hyperlipidemia   Chest pain   History of essential hypertension   GERD (gastroesophageal reflux disease)  Acute Blood Loss Anemia Hematuria  BPH  Chronic Indwelling Foley Catheter He notes on/off hematuria since foley placement around August -> urine has now cleared after stopping DAPT Stools are light brown in color 3 units pRBC ordered for presenting Hb in the 5's  Urology consulted for his hematuria - cardiology planning for him to stay until his procedure, will need hematuria w/u inpatient   Severe Aortic Stenosis  HFpEF Coronary Artery Disease  Elevated Troponin  Started on DAPT at discharge 10/24 to see if he'd tolerate -> saw with Dr. Lynnette Caffey on 10/30 and DAPT was stopped.  As he developed hematuria on DAPT, cardiac plan was for CABG and AVR (rather than PCI + TAVR). Appreciate cardiology recs -> pending CABG/AVR on 14th as above, hoping for urologic w/u for hematuria prior to surgery inpatient Antiplatelets and anticoagulation on hold due to hematuria Crestor.  Beta blocker on hold with hypotension. Diuresis with blood, continue as tolerated given BP   Hypertension Currently hypotensive due to anemia above Hold  antihypertensives  GERD PPI    DVT prophylaxis: SCD Code Status: full Family Communication: wife at bedside Disposition:   Status is: Inpatient Remains inpatient appropriate because: continued need for inpatient care   Consultants:  Urology cardiology  Procedures:  none  Antimicrobials:  Anti-infectives (From admission, onward)    None       Subjective: C/o heart burn Doesn't feel as well as he did after his last time he received transfusion   Objective: Vitals:   10/23/23 0839 10/23/23 0840 10/23/23 0845 10/23/23 0900  BP: (!) 89/58 (!) 89/58 (!) 94/52 (!) 91/56  Pulse: 82 82 86 85  Resp: (!) 22 (!) 22 19 18   Temp: 99.7 F (37.6 C) 99.7 F (37.6 C)    TempSrc: Oral Oral    SpO2:   100% 99%  Weight:      Height:        Intake/Output Summary (Last 24 hours) at 10/23/2023 3664 Last data filed at 10/23/2023 0454 Gross per 24 hour  Intake 362.33 ml  Output --  Net 362.33 ml   Filed Weights   10/23/23 0122  Weight: 66.7 kg    Examination:  General exam: Appears calm and comfortable  Respiratory system: unlabored, on 2 L  Cardiovascular system: systolic murmur  Gastrointestinal system: Abdomen is nondistended, soft and nontender.  Central nervous system: Alert and oriented. No focal neurological deficits. Extremities: bilateral trace LE edema    Data Reviewed: I have personally reviewed following labs and imaging studies  CBC: Recent Labs  Lab 10/23/23 0129 10/23/23 0131  WBC  10.9*  --   NEUTROABS 8.8*  --   HGB 5.2* 5.4*  HCT 17.4* 16.0*  MCV 93.5  --   PLT 211  --     Basic Metabolic Panel: Recent Labs  Lab 10/23/23 0129 10/23/23 0131 10/23/23 0800  NA 137 137 136  K 4.2 4.2 4.1  CL 106 103 105  CO2 24  --  21*  GLUCOSE 124* 118* 106*  BUN 21 26* 18  CREATININE 0.94 1.00 0.90  CALCIUM 8.2*  --  8.4*  MG  --   --  1.7    GFR: Estimated Creatinine Clearance: 57.1 mL/min (by C-G formula based on SCr of 0.9  mg/dL).  Liver Function Tests: Recent Labs  Lab 10/23/23 0129 10/23/23 0800  AST 27 19  ALT 18 18  ALKPHOS 37* 37*  BILITOT 0.4 1.2*  PROT 4.9* 5.1*  ALBUMIN 2.6* 2.7*    CBG: No results for input(s): "GLUCAP" in the last 168 hours.   Recent Results (from the past 240 hour(s))  Urine Culture     Status: Abnormal   Collection Time: 10/15/23  5:18 AM   Specimen: Urine, Catheterized  Result Value Ref Range Status   Specimen Description URINE, CATHETERIZED  Final   Special Requests   Final    NONE Performed at [facility], [address], [place], [place] [address]    Culture 10,000 COLONIES/mL ENTEROBACTER CLOACAE ([name])  Final   Report Status 10/21/2023 FINAL  Final   Organism ID, Bacteria ENTEROBACTER CLOACAE ([name])  Final      Susceptibility   Enterobacter cloacae - MIC*    CEFEPIME 2 SENSITIVE Sensitive     CIPROFLOXACIN <=0.25 SENSITIVE Sensitive     GENTAMICIN <=1 SENSITIVE Sensitive     IMIPENEM 0.5 SENSITIVE Sensitive     NITROFURANTOIN 256 RESISTANT Resistant     TRIMETH/SULFA <=20 SENSITIVE Sensitive     PIP/TAZO >=128 RESISTANT Resistant ug/mL    * 10,000 COLONIES/mL ENTEROBACTER CLOACAE         Radiology Studies: DG Chest Port 1 View  Result Date: 10/23/2023 CLINICAL DATA:  Chest pain EXAM: PORTABLE CHEST 1 VIEW COMPARISON:  10/05/2023 FINDINGS: The lungs are symmetrically well expanded. There is progressive central pulmonary vascular congestion without superimposed overt pulmonary edema. Mild cardiomegaly is stable. No confluent pulmonary infiltrate. No pneumothorax or pleural effusion. No acute bone abnormality. IMPRESSION: 1. Progressive central pulmonary vascular congestion without superimposed overt pulmonary edema. Stable cardiomegaly. Electronically Signed   By: Helyn Numbers M.D.   On: 10/23/2023 01:45        Scheduled Meds:  sodium chloride   Intravenous Once   alum & mag hydroxide-simeth  30 mL Oral Once   finasteride  5 mg Oral QPM    furosemide  20 mg Intravenous Once   pantoprazole  40 mg Oral Daily   rosuvastatin  40 mg Oral Daily   Continuous Infusions:  sodium chloride Stopped (10/23/23 0456)     LOS: 0 days    Time spent: over 30 min     Lacretia Nicks, MD [facility]   To contact the attending provider between 7A-7P or the covering provider during after hours 7P-7A, please log into the web site [email or web address] and access using universal [place] password for that web site. If you do not have the password, please call the hospital operator.  10/23/2023, 9:22 AM

## 2023-10-23 NOTE — ED Notes (Signed)
Pt placed on ZOLL monitor and pads.

## 2023-10-23 NOTE — Progress Notes (Addendum)
Rounding Note    Patient Name: Kyle Matthews Date of Encounter: 10/23/2023  [facility] Cardiologist: Maisie Fus, MD   Subjective   Kyle Matthews presented with hematuria and hemoglobin of 5.4.  He also had diffuse ST elevation on his EKG and mild aVR elevation.  He is undergoing blood transfusions currently as he awaits further workup  Inpatient Medications    Scheduled Meds:  sodium chloride   Intravenous Once   finasteride  5 mg Oral QPM   pantoprazole  40 mg Oral Daily   rosuvastatin  40 mg Oral Daily   Continuous Infusions:  sodium chloride Stopped (10/23/23 0456)   PRN Meds: acetaminophen **OR** acetaminophen, melatonin, ondansetron (ZOFRAN) IV   Vital Signs    Vitals:   10/23/23 0810 10/23/23 0839 10/23/23 0840 10/23/23 0845  BP: (!) 92/51 (!) 89/58 (!) 89/58 (!) 94/52  Pulse: 81 82 82 86  Resp: (!) 25 (!) 22 (!) 22 19  Temp:  99.7 F (37.6 C) 99.7 F (37.6 C)   TempSrc:  Oral Oral   SpO2: 99%   100%  Weight:      Height:        Intake/Output Summary (Last 24 hours) at 10/23/2023 0856 Last data filed at 10/23/2023 0454 Gross per 24 hour  Intake 362.33 ml  Output --  Net 362.33 ml      10/23/2023    1:22 AM 10/18/2023    9:11 AM 10/15/2023    3:37 AM  Last 3 Weights  Weight (lbs) 147 lb 149 lb 3.2 oz 147 lb  Weight (kg) 66.679 kg 67.677 kg 66.679 kg      Telemetry    Sinus rhythm - Personally Reviewed  ECG    Sinus rhythm with diffuse ST depression and mild aVR elevation- Personally Reviewed  Physical Exam   Vitals:   10/23/23 0840 10/23/23 0845  BP: (!) 89/58 (!) 94/52  Pulse: 82 86  Resp: (!) 22 19  Temp: 99.7 F (37.6 C)   SpO2:  100%    GEN: No acute distress.  Deconditioned Neck: No JVD Cardiac: RRR, SEM Respiratory: nl wob,  GI:  non-distended  MS: Bilateral trace lower extremity edema Neuro:  Nonfocal  Psych: Normal affect   Labs    High Sensitivity Troponin:   Recent Labs  Lab 10/03/23 0412  10/03/23 0542 10/03/23 1521 10/04/23 0505 10/23/23 0129  TROPONINIHS 856* 1,395* 4,814* 3,357* 369*     Chemistry Recent Labs  Lab 10/23/23 0129 10/23/23 0131  NA 137 137  K 4.2 4.2  CL 106 103  CO2 24  --   GLUCOSE 124* 118*  BUN 21 26*  CREATININE 0.94 1.00  CALCIUM 8.2*  --   PROT 4.9*  --   ALBUMIN 2.6*  --   AST 27  --   ALT 18  --   ALKPHOS 37*  --   BILITOT 0.4  --   GFRNONAA >60  --   ANIONGAP 7  --     Lipids  Recent Labs  Lab 10/23/23 0129  CHOL 122  TRIG 74  HDL 52  LDLCALC 55  CHOLHDL 2.3    Hematology Recent Labs  Lab 10/23/23 0129 10/23/23 0131  WBC 10.9*  --   RBC 1.86*  --   HGB 5.2* 5.4*  HCT 17.4* 16.0*  MCV 93.5  --   MCH 28.0  --   MCHC 29.9*  --   RDW 17.2*  --   PLT  211  --    Thyroid No results for input(s): "TSH", "FREET4" in the last 168 hours.  BNPNo results for input(s): "BNP", "PROBNP" in the last 168 hours.  DDimer No results for input(s): "DDIMER" in the last 168 hours.   Radiology    DG Chest Port 1 View  Result Date: 10/23/2023 CLINICAL DATA:  Chest pain EXAM: PORTABLE CHEST 1 VIEW COMPARISON:  10/05/2023 FINDINGS: The lungs are symmetrically well expanded. There is progressive central pulmonary vascular congestion without superimposed overt pulmonary edema. Mild cardiomegaly is stable. No confluent pulmonary infiltrate. No pneumothorax or pleural effusion. No acute bone abnormality. IMPRESSION: 1. Progressive central pulmonary vascular congestion without superimposed overt pulmonary edema. Stable cardiomegaly. Electronically Signed   By: Helyn Numbers M.D.   On: 10/23/2023 01:45    Cardiac Studies   TTE 10/03/2023 1. Left ventricular ejection fraction, by estimation, is 55 to 60%. The  left ventricle has normal function. The left ventricle has no regional  wall motion abnormalities. There is mild left ventricular hypertrophy.  Left ventricular diastolic parameters  are indeterminate. Elevated left atrial  pressure.   2. Right ventricular systolic function is normal. The right ventricular size is normal. There is mildly elevated pulmonary artery systolic pressure.   3. Left atrial size was moderately dilated.   4. Right atrial size was moderately dilated.   5. The mitral valve is abnormal. Mild to moderate mitral valve regurgitation. No evidence of mitral stenosis.   6. The tricuspid valve is abnormal.   7. The aortic valve was not well visualized. There is severe calcifcation of the aortic valve. There is severe thickening of the aortic valve.  Aortic valve regurgitation is moderate to severe. Severe aortic valve stenosis. Severe aortic stenosis is  present. Aortic valve mean gradient measures 42.0 mmHg. Aortic valve peak gradient measures 69.2 mmHg. Aortic valve area, by VTI measures 0.89 cm.   8. The inferior vena cava is dilated in size with >50% respiratory variability, suggesting right atrial pressure of 8 mmHg.    LHC 10/09/2023   Ost LM lesion is 50% stenosed.   1.  Moderate left main stenosis with an RFR of 0.83. 2.  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 3.  Fick cardiac output of 6.5 L/min and Fick cardiac index of 3.8 L/min/m with the following hemodynamics:             Right atrial pressure mean of 7             Right ventricular pressure 42/-2 with an end-diastolic pressure of 8 mmHg             Wedge pressure mean of 14 mmHg with V waves to 23 mmHg             PA pressure of 38/16 with a mean of 24 mmHg             PVR of 1.5 Woods units             PA pulsatility index of 3.1   Recommendation: Continue evaluation for aortic valve intervention.  Will obtain TAVR protocol CTA tomorrow.  Patient Profile     77 y.o. 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   Assessment & Plan     Hematuria -Currently undergoing blood transfusion -  Recommend urology consult for workup and intervention  CAD- 50% LM -His EKG shows diffuse ST depression and some mild elevation in aVR.  He has known 50% left main disease.  He is pending a CABG on the 14th.  Will advocate for urology workup prior to his surgery. -Antiplatelets are on hold secondary to hematuria, as well cannot do anticoagulation -Continue Crestor 40 mg daily -He is hypotensive holding off on beta-blocker  HFpEF -Will redosed with Lasix 20 IV, he has shortness of breath with decreased breath sounds and trace edema  Severe AS -He is pending a TAVR  Overall he has multiple comorbidities in the setting of hematuria and anemia.  Will need to have his hematuria addressed with urology and stay until his procedure (CABG/TAVR)   Time Spent Directly with Patient:  I have spent a total of 50 minutes with the patient reviewing hospital notes, telemetry, EKGs, labs and examining the patient as well as establishing an assessment and plan that was discussed personally with the patient.  > 50% of time was spent in direct patient care.      For questions or updates, please contact [facility] Please consult [email or web address] for contact info under        Signed, Maisie Fus, MD  10/23/2023, 8:56 AM

## 2023-10-23 NOTE — ED Notes (Signed)
ED TO INPATIENT HANDOFF REPORT  ED Nurse Name and Phone #: Osvaldo Shipper RN [phone]  S Name/Age/Gender Kyle Matthews 77 y.o. male Room/Bed: 001C/001C  Code Status   Code Status: Full Code  Home/SNF/Other Home Patient oriented to: self, place, time, and situation Is this baseline? Yes   Triage Complete: Triage complete  Chief Complaint Chest pain [R07.9]  Triage Note Pt arrived via EMS from home. Pt c/o cheat pain x 2 hours. Pt states he is scheduled for bypass procedure on 11/14. Pt took 3 nitroglycerin PTA, CP currently 3/10. Pt did not take aspirin because his doctor told him to stop aspirin due to recent bleeding in catheter. Pt also stopped taking plavix last Sunday.   Allergies Allergies  Allergen Reactions   Tetanus Toxoids     Childhood Allergy     Level of Care/Admitting Diagnosis ED Disposition     ED Disposition  Admit   Condition  --   Comment  Hospital Area: [facility] [[id number]]  Level of Care: Progressive [102]  Admit to Progressive based on following criteria: MULTISYSTEM THREATS such as stable sepsis, metabolic/electrolyte imbalance with or without encephalopathy that is responding to early treatment.  May admit patient to [facility] or [facility] if equivalent level of care is available:: No  Covid Evaluation: Asymptomatic - no recent exposure (last 10 days) testing not required  Diagnosis: Chest pain [841324]  Admitting Physician: Angie Fava [[id number]]  Attending Physician: Angie Fava [[id number]]  Certification:: I certify this patient will need inpatient services for at least 2 midnights  Expected Medical Readiness: 10/25/2023          B Medical/Surgery History Past Medical History:  Diagnosis Date   BPH (benign prostatic hyperplasia)    Status post biopsy in 2009. 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 GR 1 DD. No regional wall motion abnormality. Moderate aortic stenosis with moderate regurgitation. Mean gradient 34 mmHg.     A IV Location/Drains/Wounds Patient Lines/Drains/Airways Status     Active Line/Drains/Airways     Name Placement date Placement time Site Days   Peripheral IV 10/23/23 18 G Left Antecubital 10/23/23  0125  Antecubital  less than 1   Peripheral IV 10/23/23 18 G Right Antecubital 10/23/23  0128  Antecubital  less than 1   Urethral Catheter modesty fairley rn, msn Non-latex 14 Fr. 10/15/23  0435  Non-latex  8            Intake/Output Last 24 hours  Intake/Output Summary (Last 24 hours) at 10/23/2023 1007 Last data filed at 10/23/2023 0454 Gross per 24 hour  Intake 362.33 ml  Output --  Net 362.33 ml    Labs/Imaging Results for orders  placed or performed during the hospital encounter of 10/23/23 (from the past 48 hour(s))  Hemoglobin A1c     Status: Abnormal   Collection Time: 10/23/23  1:29 AM  Result Value Ref Range   Hgb A1c MFr Bld 4.7 (L) 4.8 - 5.6 %    Comment: (NOTE) Pre diabetes:          5.7%-6.4%  Diabetes:              >6.4%  Glycemic control for   <7.0% adults with diabetes    Mean Plasma Glucose 88.19 mg/dL    Comment: Performed at [facility], [address] [place], [place] [address]   CBC with Differential/Platelet     Status: Abnormal   Collection Time: 10/23/23  1:29 AM  Result Value Ref Range   WBC 10.9 (H) 4.0 - 10.5 K/uL   RBC 1.86 (L) 4.22 - 5.81 MIL/uL   Hemoglobin 5.2 (LL) 13.0 - 17.0 g/dL    Comment: REPEATED TO VERIFY THIS CRITICAL RESULT HAS VERIFIED AND BEEN CALLED TO T. DEAN, RN BY JEZREEL LAS IGAN ON 11 04 2024 AT 0201, AND HAS BEEN READ BACK.     HCT 17.4 (L) 39.0 - 52.0 %   MCV 93.5 80.0 - 100.0 fL   MCH 28.0 26.0 - 34.0 pg   MCHC 29.9 (L) 30.0 - 36.0 g/dL   RDW 98.1 (H) 19.1 - 47.8 %   Platelets 211 150 - 400 K/uL   nRBC 0.0 0.0 - 0.2 %   Neutrophils Relative % 81 %   Neutro Abs 8.8 (H) 1.7 - 7.7 K/uL   Lymphocytes Relative 12 %   Lymphs Abs 1.3 0.7 - 4.0 K/uL   Monocytes Relative 6 %   Monocytes Absolute 0.7 0.1 - 1.0 K/uL   Eosinophils Relative 0 %   Eosinophils Absolute 0.0 0.0 - 0.5 K/uL   Basophils Relative 0 %   Basophils Absolute 0.0 0.0 - 0.1 K/uL   Immature Granulocytes 1 %   Abs Immature Granulocytes 0.07 0.00 - 0.07 K/uL    Comment: Performed at [facility] Lab, [address], [place], [place] [address]  Protime-INR     Status: Abnormal   Collection Time: 10/23/23  1:29 AM  Result Value Ref Range   Prothrombin Time 16.0 (H) 11.4 - 15.2 seconds   INR 1.3 (H) 0.8 - 1.2    Comment: (NOTE) INR goal varies based on device and disease states. Performed at [facility], [address], [place], [place] [address]   APTT     Status: Abnormal   Collection Time: 10/23/23  1:29 AM  Result Value Ref Range   aPTT >200 (HH) 24 - 36 seconds    Comment:        IF BASELINE aPTT IS ELEVATED, SUGGEST PATIENT RISK ASSESSMENT BE USED TO DETERMINE APPROPRIATE ANTICOAGULANT THERAPY. REPEATED TO VERIFY CRITICAL RESULT CALLED TO, READ BACK BY AND VERIFIED WITH: Charlesetta Garibaldi RN 11.04.2024 [phone] MTIU Performed at [facility], [address] [place], [place] [address]   Comprehensive metabolic panel     Status: Abnormal    Collection Time: 10/23/23  1:29 AM  Result Value Ref Range   Sodium 137 135 - 145 mmol/L   Potassium 4.2 3.5 - 5.1 mmol/L   Chloride 106 98 - 111 mmol/L   CO2 24 22 - 32 mmol/L   Glucose, Bld 124 (H) 70 - 99 mg/dL    Comment: Glucose  reference range applies only to samples taken after fasting for at least 8 hours.   BUN 21 8 - 23 mg/dL   Creatinine, Ser 0.45 0.61 - 1.24 mg/dL   Calcium 8.2 (L) 8.9 - 10.3 mg/dL   Total Protein 4.9 (L) 6.5 - 8.1 g/dL   Albumin 2.6 (L) 3.5 - 5.0 g/dL   AST 27 15 - 41 U/L   ALT 18 0 - 44 U/L   Alkaline Phosphatase 37 (L) 38 - 126 U/L   Total Bilirubin 0.4 <1.2 mg/dL   GFR, Estimated >40 >98 mL/min    Comment: (NOTE) Calculated using the CKD-EPI Creatinine Equation (2021)    Anion gap 7 5 - 15    Comment: Performed at [facility], [address] [place], [place] [address]  Troponin I (High Sensitivity)     Status: Abnormal   Collection Time: 10/23/23  1:29 AM  Result Value Ref Range   Troponin I (High Sensitivity) 369 (HH) <18 ng/L    Comment: CRITICAL RESULT CALLED TO, READ BACK BY AND VERIFIED WITH DEAN,T RN [phone] 10/23/23 AMIREHSANIF (NOTE) Elevated high sensitivity troponin I (hsTnI) values and significant  changes across serial measurements may suggest ACS but many other  chronic and acute conditions are known to elevate hsTnI results.  Refer to the "Links" section for chest pain algorithms and additional  guidance. Performed at [facility] Lab, [address], [place], [place] [address]   Lipid panel     Status: None   Collection Time: 10/23/23  1:29 AM  Result Value Ref Range   Cholesterol 122 0 - 200 mg/dL   Triglycerides 74 <086 mg/dL   HDL 52 >57 mg/dL   Total CHOL/HDL Ratio 2.3 RATIO   VLDL 15 0 - 40 mg/dL   LDL Cholesterol 55 0 - 99 mg/dL    Comment:        Total Cholesterol/HDL:CHD Risk Coronary Heart Disease Risk Table                     Men   Women  1/2 Average Risk   3.4   3.3  Average Risk       5.0   4.4  2 X  Average Risk   9.6   7.1  3 X Average Risk  23.4   11.0        Use the calculated Patient Ratio above and the CHD Risk Table to determine the patient's CHD Risk.        ATP III CLASSIFICATION (LDL):  <100     mg/dL   Optimal  846-962  mg/dL   Near or Above                    Optimal  130-159  mg/dL   Borderline  952-841  mg/dL   High  >324     mg/dL   Very High Performed at [facility], [address] [place], [place] [address]   I-stat chem 8, ed     Status: Abnormal   Collection Time: 10/23/23  1:31 AM  Result Value Ref Range   Sodium 137 135 - 145 mmol/L   Potassium 4.2 3.5 - 5.1 mmol/L   Chloride 103 98 - 111 mmol/L   BUN 26 (H) 8 - 23 mg/dL   Creatinine, Ser 7.25 0.61 - 1.24 mg/dL   Glucose, Bld 366 (H) 70 - 99 mg/dL    Comment: Glucose reference range  applies only to samples taken after fasting for at least 8 hours.   Calcium, Ion 1.17 1.15 - 1.40 mmol/L   TCO2 24 22 - 32 mmol/L   Hemoglobin 5.4 (LL) 13.0 - 17.0 g/dL   HCT 95.1 (L) 88.4 - 16.6 %   Comment NOTIFIED PHYSICIAN   I-Stat CG4 Lactic Acid, ED     Status: None   Collection Time: 10/23/23  1:32 AM  Result Value Ref Range   Lactic Acid, Venous 1.3 0.5 - 1.9 mmol/L  Type and screen [facility]     Status: None (Preliminary result)   Collection Time: 10/23/23  1:40 AM  Result Value Ref Range   ABO/RH(D) AB POS    Antibody Screen NEG    Sample Expiration 10/26/2023,2359    Unit Number [id number]    Blood Component Type RED CELLS,LR    Unit division 00    Status of Unit ISSUED    Transfusion Status OK TO TRANSFUSE    Crossmatch Result Compatible    Unit Number [id number]    Blood Component Type RED CELLS,LR    Unit division 00    Status of Unit ISSUED    Transfusion Status OK TO TRANSFUSE    Crossmatch Result      Compatible Performed at [facility] Lab, [address], [place], [place] [address]    Unit Number [id number]    Blood Component Type RED CELLS,LR     Unit division 00    Status of Unit ISSUED    Transfusion Status OK TO TRANSFUSE    Crossmatch Result Compatible   Prepare RBC (crossmatch)     Status: None   Collection Time: 10/23/23  1:41 AM  Result Value Ref Range   Order Confirmation      ORDER PROCESSED BY BLOOD BANK Performed at [facility], [address], [place], [place] [address]   Troponin I (High Sensitivity)     Status: Abnormal   Collection Time: 10/23/23  7:40 AM  Result Value Ref Range   Troponin I (High Sensitivity) 482 (HH) <18 ng/L    Comment: CRITICAL VALUE NOTED. VALUE IS CONSISTENT WITH PREVIOUSLY REPORTED/CALLED VALUE (NOTE) Elevated high sensitivity troponin I (hsTnI) values and significant  changes across serial measurements may suggest ACS but many other  chronic and acute conditions are known to elevate hsTnI results.  Refer to the "Links" section for chest pain algorithms and additional  guidance. Performed at [facility], [address], [place], [place] [address]   Magnesium     Status: None   Collection Time: 10/23/23  7:40 AM  Result Value Ref Range   Magnesium 1.7 1.7 - 2.4 mg/dL    Comment: Performed at [facility], [address] [place], [place] [address]  Iron and TIBC     Status: Abnormal   Collection Time: 10/23/23  7:40 AM  Result Value Ref Range   Iron 137 45 - 182 ug/dL   TIBC 350 (H) 093 - 818 ug/dL   Saturation Ratios 30 17.9 - 39.5 %   UIBC 324 ug/dL    Comment: Performed at [facility] Lab, [address], [place], [place] [address]  Ferritin     Status: None   Collection Time: 10/23/23  7:40 AM  Result Value Ref Range   Ferritin 33 24 - 336 ng/mL    Comment: Performed at [facility], [address], [place], [place] [address]  Comprehensive metabolic panel     Status: Abnormal   Collection Time: 10/23/23  8:00 AM  Result Value Ref Range   Sodium 136 135 - 145 mmol/L   Potassium 4.1 3.5 - 5.1 mmol/L   Chloride 105 98 - 111 mmol/L   CO2 21  (L) 22 - 32 mmol/L   Glucose, Bld 106 (H) 70 - 99 mg/dL    Comment: Glucose reference range applies only to samples taken after fasting for at least 8 hours.   BUN 18 8 - 23 mg/dL   Creatinine, Ser 6.57 0.61 - 1.24 mg/dL   Calcium 8.4 (L) 8.9 - 10.3 mg/dL   Total Protein 5.1 (L) 6.5 - 8.1 g/dL   Albumin 2.7 (L) 3.5 - 5.0 g/dL   AST 19 15 - 41 U/L   ALT 18 0 - 44 U/L   Alkaline Phosphatase 37 (L) 38 - 126 U/L   Total Bilirubin 1.2 (H) <1.2 mg/dL   GFR, Estimated >84 >69 mL/min    Comment: (NOTE) Calculated using the CKD-EPI Creatinine Equation (2021)    Anion gap 10 5 - 15    Comment: Performed at [facility] Lab, [address] [place], [place] [address]  Magnesium     Status: None   Collection Time: 10/23/23  8:00 AM  Result Value Ref Range   Magnesium 1.7 1.7 - 2.4 mg/dL    Comment: Performed at [facility] Lab, [address] [place], [place] [address]   DG Chest Port 1 View  Result Date: 10/23/2023 CLINICAL DATA:  Chest pain EXAM: PORTABLE CHEST 1 VIEW COMPARISON:  10/05/2023 FINDINGS: The lungs are symmetrically well expanded. There is progressive central pulmonary vascular congestion without superimposed overt pulmonary edema. Mild cardiomegaly is stable. No confluent pulmonary infiltrate. No pneumothorax or pleural effusion. No acute bone abnormality. IMPRESSION: 1. Progressive central pulmonary vascular congestion without superimposed overt pulmonary edema. Stable cardiomegaly. Electronically Signed   By: Helyn Numbers M.D.   On: 10/23/2023 01:45    Pending Labs Unresulted Labs (From admission, onward)     Start     Ordered   10/23/23 1600  Hemoglobin and hematocrit, blood  Once-Timed,   TIMED        10/23/23 0400   10/23/23 1200  Hemoglobin and hematocrit, blood  Once-Timed,   TIMED        10/23/23 0400   10/23/23 0830  CBC with Differential/Platelet  Once,   TIMED        10/23/23 0830   10/23/23 0800  CBC with Differential/Platelet  Once-Timed,   TIMED         10/23/23 0359            Vitals/Pain Today's Vitals   10/23/23 0839 10/23/23 0840 10/23/23 0845 10/23/23 0900  BP: (!) 89/58 (!) 89/58 (!) 94/52 (!) 91/56  Pulse: 82 82 86 85  Resp: (!) 22 (!) 22 19 18   Temp: 99.7 F (37.6 C) 99.7 F (37.6 C)    TempSrc: Oral Oral    SpO2:   100% 99%  Weight:      Height:      PainSc:        Isolation Precautions No active isolations  Medications Medications  0.9 %  sodium chloride infusion (0 mLs Intravenous Paused 10/23/23 0456)  0.9 %  sodium chloride infusion (Manually program via Guardrails IV Fluids) ( Intravenous Restarted 10/23/23 0610)  acetaminophen (TYLENOL) tablet 650 mg (has no administration in time range)  Or  acetaminophen (TYLENOL) suppository 650 mg (has no administration in time range)  melatonin tablet 3 mg (has no administration in time range)  ondansetron (ZOFRAN) injection 4 mg (has no administration in time range)  finasteride (PROSCAR) tablet 5 mg (has no administration in time range)  pantoprazole (PROTONIX) EC tablet 40 mg (40 mg Oral Given 10/23/23 0924)  rosuvastatin (CRESTOR) tablet 40 mg (40 mg Oral Given 10/23/23 0924)  aspirin chewable tablet 324 mg (324 mg Oral Given 10/23/23 0130)  heparin injection 4,000 Units (4,000 Units Intravenous Given 10/23/23 0131)  calcium gluconate 1 g/ 50 mL sodium chloride IVPB (0 mg Intravenous Stopped 10/23/23 0250)  furosemide (LASIX) injection 20 mg (20 mg Intravenous Given 10/23/23 0402)  furosemide (LASIX) injection 20 mg (20 mg Intravenous Given 10/23/23 0925)  alum & mag hydroxide-simeth (MAALOX/MYLANTA) 200-200-20 MG/5ML suspension 30 mL (30 mLs Oral Given 10/23/23 0924)    Mobility walks with person assist     Focused Assessments Cardiac Assessment Handoff:  Cardiac Rhythm: Normal sinus rhythm No results found for: "CKTOTAL", "CKMB", "CKMBINDEX", "TROPONINI" No results found for: "DDIMER" Does the Patient currently have chest pain? No   , low Hbg- 3 total PRBC  units in   R Recommendations: See Admitting Provider Note  Report given to:   Additional Notes:

## 2023-10-23 NOTE — ED Triage Notes (Signed)
Pt arrived via EMS from home. Pt c/o cheat pain x 2 hours. Pt states he is scheduled for bypass procedure on 11/14. Pt took 3 nitroglycerin PTA, CP currently 3/10. Pt did not take aspirin because his doctor told him to stop aspirin due to recent bleeding in catheter. Pt also stopped taking plavix last Sunday.

## 2023-10-23 NOTE — ED Notes (Signed)
RN aware pt has had a soft BP

## 2023-10-23 NOTE — ED Notes (Signed)
Pt well appearing upon transport to floor.

## 2023-10-23 NOTE — ED Notes (Signed)
Patient placed on 2 L [place] for comfort.  Maintaining saturations between 91%-94% on room air.

## 2023-10-23 NOTE — Consult Note (Signed)
Cardiology Consultation   Patient ID: Kyle Matthews MRN: [id number]; DOB: 03/12/1946  Admit date: 10/23/2023 Date of Consult: 10/23/2023  PCP:  Mila Palmer, MD   [facility] Providers Cardiologist:  Maisie Fus, MD        Patient Profile:   Kyle Matthews is a 77 y.o. 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  History of Present Illness:   Kyle Matthews is a 77 y.o. 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 He was recently hospitalized for HF exacerbation and hematuria. He underwent LHC/RHC which showed 50% LM lesion. Prostatectomy was planned but postponed due to this and his severe valvular disease. He was placed on a trial of DAPT but was not able to tolerate this due to hematuria. He was taken off and referred to CTS who scheduled him for AVR/CABG on 11/02/23. He was seen in cardiology clinic 5 days ago at which point he had no chest pain or dyspnea but was still having ongoing hematuria. He has not been on aspirin or P2Y12. He reports the hematuria stopped 2 days ago but has since developed substernal chest pressure and dyspnea at rest. He has also had worsening lower extremity edema and orthopnea. EKG sinus rhythm with diffuse ST depressions and AvR elevation. Hb 5.4. Troponin pending.    Past Medical History:  Diagnosis Date   BPH (benign prostatic hyperplasia)    Status post biopsy in 2009. 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 GR 1 DD. No regional wall motion abnormality. Moderate aortic stenosis with moderate regurgitation. Mean gradient 34 mmHg.     Home Medications:  Prior to Admission medications   Medication Sig Start Date End Date Taking? Authorizing Provider  amLODipine (NORVASC) 5 MG tablet Take 1 tablet (5 mg total) by mouth daily. 10/13/23   Miguel Rota, MD  aspirin EC 81 MG tablet Take 1 tablet (81 mg total) by mouth daily. Swallow whole. Patient not taking: Reported on 10/18/2023 10/12/23 11/11/23  Miguel Rota, MD  clopidogrel (PLAVIX) 75 MG tablet Take 1 tablet (75 mg total) by mouth daily. Patient not taking: Reported on 10/18/2023 10/12/23 11/11/23  Miguel Rota, MD  finasteride (PROSCAR) 5 MG tablet Take 5 mg by mouth every evening.    [provider]  furosemide (LASIX) 20 MG tablet Take 1 tablet (20 mg total) by mouth daily as needed. 10/18/23   Alverda Skeans  K, MD  nitroGLYCERIN (NITROSTAT) 0.4 MG SL tablet Place 1 tablet (0.4 mg total) under the tongue every 5 (five) minutes as needed for chest pain. 10/19/23   [name], MD  ondansetron (ZOFRAN-ODT) 4 MG disintegrating tablet Take 1 tablet (4 mg total) by mouth every 8 (eight) hours as needed for nausea or vomiting. 10/15/23   Jeannie Fend, PA-C  pantoprazole (PROTONIX) 40 MG tablet Take 1 tablet (40 mg total) by mouth daily. 10/19/23   [name], MD  rosuvastatin (CRESTOR) 40 MG tablet Take 40 mg by mouth daily.    [provider]     Inpatient Medications: Scheduled Meds:  sodium chloride   Intravenous Once   fentaNYL (SUBLIMAZE) injection  50 mcg Intravenous Once   Continuous Infusions:  sodium chloride 10 mL/hr at 10/23/23 0131   calcium gluconate in NaCl 1,000 mg (10/23/23 0147)   PRN Meds:   Allergies:    Allergies  Allergen Reactions   Tetanus Toxoids     Childhood Allergy     Social History:   Social History   Socioeconomic History   Marital status: Married    Spouse name: Not on file   Number of children: 1   Years of education: Not on file   Highest education level: Not on file  Occupational History   Occupation: Retired    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 Lives with his wife of 49 years. He is a retired Marine scientist used to work for [employer]   4 cups of coffee a day.    No structured exercise regimen   Social Determinants of Health   Financial Resource Strain: Not on file  Food Insecurity: No Food Insecurity (10/04/2023)   Hunger Vital Sign    Worried About Running Out of Food in the Last Year: Never true    Ran Out of Food in the Last Year: Never true  Transportation Needs: No Transportation Needs (10/04/2023)   PRAPARE - Administrator, Civil Service (Medical): No    Lack of Transportation (Non-Medical): No  Physical Activity: Not on file  Stress: Not on file  Social Connections: Not on file  Intimate Partner Violence: Not At Risk (10/04/2023)   Humiliation, Afraid, Rape, and Kick  questionnaire    Fear of Current or Ex-Partner: No    Emotionally Abused: No    Physically Abused: No    Sexually Abused: No    Family History:   Family History  Problem Relation Age of Onset   Coronary artery disease Mother 10       57. Was diagnosed with a blood clot - suspect that this was a STEMI   Lung cancer Father    Thyroid disease Sister    Healthy Brother 10   Pancreatic cancer Sister 10   Cancer Maternal Grandmother    Heart disease Maternal Grandfather 49   Cancer Paternal Grandmother  Stroke Paternal Grandfather      ROS:  Please see the history of present illness. All other ROS reviewed and negative.     Physical Exam/Data:   Vitals:   10/23/23 0121 10/23/23 0122 10/23/23 0130  BP: 101/66  103/64  Pulse: 98  97  Resp: (!) 22  (!) 24  Temp: 98.8 F (37.1 C)    TempSrc: Oral    SpO2: 99%  100%  Weight:  66.7 kg   Height:  5\' 3"  (1.6 m)    No intake or output data in the 24 hours ending 10/23/23 0152    10/23/2023    1:22 AM 10/18/2023    9:11 AM 10/15/2023    3:37 AM  Last 3 Weights  Weight (lbs) 147 lb 149 lb 3.2 oz 147 lb  Weight (kg) 66.679 kg 67.677 kg 66.679 kg     Body mass index is 26.04 kg/m.  General:  Well nourished, well developed, in no acute distress HEENT: normal Neck: + JVD 10 cm Vascular: Distal pulses 2+ bilaterally Cardiac:  RRR; systolic ejection murmur and holodiastolic murmur Lungs:  bibasilar crackles, no wheezing, breathing comfortably  Abd: soft, nontender, no hepatomegaly  Ext: 2+ pitting edema bilaterally Musculoskeletal:  No deformities Skin: warm and dry  Neuro:  no focal abnormalities noted Psych:  Normal affect    Laboratory Data:  High Sensitivity Troponin:   Recent Labs  Lab 10/03/23 0412 10/03/23 0542 10/03/23 1521 10/04/23 0505  TROPONINIHS 856* 1,395* 4,814* 3,357*     Chemistry Recent Labs  Lab 10/23/23 0131  NA 137  K 4.2  CL 103  GLUCOSE 118*  BUN 26*  CREATININE 1.00    No results  for input(s): "PROT", "ALBUMIN", "AST", "ALT", "ALKPHOS", "BILITOT" in the last 168 hours. Lipids No results for input(s): "CHOL", "TRIG", "HDL", "LABVLDL", "LDLCALC", "CHOLHDL" in the last 168 hours.  Hematology Recent Labs  Lab 10/23/23 0131  HGB 5.4*  HCT 16.0*   Thyroid No results for input(s): "TSH", "FREET4" in the last 168 hours.  BNPNo results for input(s): "BNP", "PROBNP" in the last 168 hours.  DDimer No results for input(s): "DDIMER" in the last 168 hours.   Radiology/Studies:  DG Chest Port 1 View  Result Date: 10/23/2023 CLINICAL DATA:  Chest pain EXAM: PORTABLE CHEST 1 VIEW COMPARISON:  10/05/2023 FINDINGS: The lungs are symmetrically well expanded. There is progressive central pulmonary vascular congestion without superimposed overt pulmonary edema. Mild cardiomegaly is stable. No confluent pulmonary infiltrate. No pneumothorax or pleural effusion. No acute bone abnormality. IMPRESSION: 1. Progressive central pulmonary vascular congestion without superimposed overt pulmonary edema. Stable cardiomegaly. Electronically Signed   By: Helyn Numbers M.D.   On: 10/23/2023 01:45     Assessment and Plan:  77 y.o. 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 This likely represents demand ischemia due to anemia and known obstructive CAD rather than ACS / unstable coronary plaque event.   Recommend treatment of anemia with blood transfusion to goal Hb > 8.  Recommend IV lasix for treatment of volume overload / HFpEF and to prevent TACO. Avoid anticoagulation or antiplatelets.   Risk Assessment/Risk Scores:        New York Heart Association (NYHA) Functional Class NYHA Class II        For questions or updates, please  contact [facility] Please consult [email or web address] for contact info under     Signed, Lorinda Creed  10/23/2023 1:52 AM

## 2023-10-23 NOTE — Consult Note (Addendum)
Urology Consult Note   Requesting Attending Physician:  Kyle Matthews., * Service Providing Consult: Urology  Consulting Attending: Dr. Jennette Matthews   Reason for Consult:  transient hematuria  HPI: Kyle Matthews is seen in consultation for reasons noted above at the request of Kyle Matthews., *patient presents to [facility] emergency department for substernal chest pain and dyspnea at rest.  This was thought to be 2/2 acute blood loss anemia and cardiology has requested that we assess patient in advance of pending cardiac catheterization on the 14th of this month.  PMH significant for severe AAS, moderate-severe AI, CAD-50% LM, HFpEF, HTN, and known BPH.    He is followed by Dr. Berneice Heinrich, last seen 09/12/2023.  He had previously been hospitalized 8/24 for renal failure due to outlet obstruction.  He has a very large prostate estimated at 120g with large median lobe.  He had been scheduled for prostatectomy but this was postponed due to his significant cardiovascular complications.    ------------------  Assessment:  77 y.o. male with ABLA 2/2 transient hematuria. 120g prostate w/ median lobe.    Recommendations: # BPH # Bladder outlet obstruction # Hematuria # ABLA  Unfortunately [name] has gotten stuck in a bit of a loop.  He ideally needs to be optimized from a cardiac position before undergoing prostatectomy.  He also needs to address his prostate and associated hematuria so that he can remain hemodynamically stable long enough to get his cardiac catheterization.  I discussed situation with he and his wife.  He may show benefit in the short-term by undergoing androgen deprivation therapy, reducing vascularity and shrinking the size of his prostate.  This is a noninvasive intervention and in time he will return to his hormonal baseline. They were amenable to starting Firmagon, which I have ordered today.  Failing improvement from this, we will likely discuss arterial  embolization of the prostate with interventional radiology.  Urine is clear yellow on assessment.  Considering that any further trauma across his prostate will only worsen the situation, would recommend leaving catheter in place until after his cardiac catheterization.  Case and plan discussed with Kyle Matthews  Past Medical History: Past Medical History:  Diagnosis Date   BPH (benign prostatic hyperplasia)    Status post biopsy in 2009. 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   Stroke (HCC) 2007   No true etiology noted    Past Surgical History:  Past Surgical History:  Procedure Laterality Date   CORONARY PRESSURE/FFR STUDY N/A 10/09/2023   Procedure: CORONARY PRESSURE/FFR STUDY;  Surgeon: [name], MD;  Location: [facility];  Service: Cardiovascular;  Laterality: N/A;   RIGHT/LEFT HEART CATH AND CORONARY ANGIOGRAPHY N/A 10/09/2023   Procedure: RIGHT/LEFT HEART CATH AND CORONARY ANGIOGRAPHY;  Surgeon: [name], MD;  Location: [facility];  Service: Cardiovascular;  Laterality: N/A;   TRANSTHORACIC ECHOCARDIOGRAM  04/28/2017    EF 65-70%. GR 1 DD. No regional wall motion abnormality. Moderate aortic stenosis with moderate regurgitation. Mean gradient 34 mmHg.    Medication: Current Facility-Administered Medications  Medication Dose Route Frequency Provider Last Rate Last Admin   0.9 %  sodium chloride infusion (Manually program via Guardrails IV Fluids)   Intravenous Once Kyle Sous, MD 10 mL/hr  at 10/23/23 0610 Restarted at 10/23/23 0610   0.9 %  sodium chloride infusion   Intravenous Continuous Kyle Sous, MD   Paused at 10/23/23 [phone]   acetaminophen (TYLENOL) tablet 650 mg  650 mg Oral Q6H PRN Howerter, Justin B, DO       Or   acetaminophen (TYLENOL) suppository 650  mg  650 mg Rectal Q6H PRN Howerter, Justin B, DO       degarelix [facility]) injection 240 mg  240 mg Subcutaneous Once Kyle Mood, NP       finasteride (PROSCAR) tablet 5 mg  5 mg Oral QPM Howerter, Justin B, DO       melatonin tablet 3 mg  3 mg Oral QHS PRN Howerter, Justin B, DO       ondansetron (ZOFRAN) injection 4 mg  4 mg Intravenous Q6H PRN Howerter, Justin B, DO       pantoprazole (PROTONIX) EC tablet 40 mg  40 mg Oral Daily Howerter, Justin B, DO   40 mg at 10/23/23 0924   rosuvastatin (CRESTOR) tablet 40 mg  40 mg Oral Daily Howerter, Justin B, DO   40 mg at 10/23/23 9604   Current Outpatient Medications  Medication Sig Dispense Refill   amLODipine (NORVASC) 5 MG tablet Take 1 tablet (5 mg total) by mouth daily. 30 tablet 0   aspirin EC 81 MG tablet Take 1 tablet (81 mg total) by mouth daily. Swallow whole. 30 tablet 0   clopidogrel (PLAVIX) 75 MG tablet Take 1 tablet (75 mg total) by mouth daily. 30 tablet 0   finasteride (PROSCAR) 5 MG tablet Take 5 mg by mouth every evening.     furosemide (LASIX) 20 MG tablet Take 1 tablet (20 mg total) by mouth daily as needed. 30 tablet 3   nitroGLYCERIN (NITROSTAT) 0.4 MG SL tablet Place 1 tablet (0.4 mg total) under the tongue every 5 (five) minutes as needed for chest pain. 25 tablet 3   ondansetron (ZOFRAN-ODT) 4 MG disintegrating tablet Take 1 tablet (4 mg total) by mouth every 8 (eight) hours as needed for nausea or vomiting. 12 tablet 0   pantoprazole (PROTONIX) 40 MG tablet Take 1 tablet (40 mg total) by mouth daily. 30 tablet 11   rosuvastatin (CRESTOR) 40 MG tablet Take 40 mg by mouth daily.      Allergies: Allergies  Allergen Reactions   Tetanus Toxoids     Childhood Allergy     Social History: Social History   Tobacco Use   Smoking status: Every Day    Current packs/day: 1.50    Average packs/day: 1.5 packs/day for 24.0 years (36.0 ttl pk-yrs)    Types: Cigarettes   Smokeless tobacco: Never  Substance Use  Topics   Alcohol use: Yes    Alcohol/week: 6.0 standard drinks of alcohol    Types: 6 Cans of beer per week   Drug use: No    Family History Family History  Problem Relation Age of Onset   Coronary artery disease Mother 22       67. Was diagnosed with a blood clot - suspect that this was a STEMI   Lung cancer Father    Thyroid disease Sister    Healthy Brother 34   Pancreatic cancer Sister 77   Cancer Maternal Grandmother    Heart disease Maternal Grandfather 78   Cancer Paternal Grandmother    Stroke Paternal Grandfather     Review of Systems  Genitourinary:  Positive for hematuria.  Objective   Vital signs in last 24 hours: BP (!) 90/55   Pulse 84   Temp 99.6 F (37.6 C) (Oral)   Resp 20   Ht 5\' 3"  (1.6 m)   Wt 66.7 kg   SpO2 99%   BMI 26.04 kg/m   Physical Exam General: NAD, A&O, resting, appropriate HEENT: [place]/AT Pulmonary: Normal work of breathing Cardiovascular: RRR, no cyanosis Abdomen: Soft, NTTP, nondistended GU: foley in place draining clear yellow urine Neuro: Appropriate, no focal neurological deficits  Most Recent Labs: Lab Results  Component Value Date   WBC 10.9 (H) 10/23/2023   HGB 5.4 (LL) 10/23/2023   HCT 16.0 (L) 10/23/2023   PLT 211 10/23/2023    Lab Results  Component Value Date   NA 136 10/23/2023   K 4.1 10/23/2023   CL 105 10/23/2023   CO2 21 (L) 10/23/2023   BUN 18 10/23/2023   CREATININE 0.90 10/23/2023   CALCIUM 8.4 (L) 10/23/2023   MG 1.7 10/23/2023   PHOS 4.1 10/12/2023    Lab Results  Component Value Date   INR 1.3 (H) 10/23/2023   APTT >200 (HH) 10/23/2023     Urine Culture: @[id number] (laburin,org,[id number],[id number])@   IMAGING: DG Chest Port 1 View  Result Date: 10/23/2023 CLINICAL DATA:  Chest pain EXAM: PORTABLE CHEST 1 VIEW COMPARISON:  10/05/2023 FINDINGS: The lungs are symmetrically well expanded. There is progressive central pulmonary vascular congestion without superimposed overt pulmonary edema.  Mild cardiomegaly is stable. No confluent pulmonary infiltrate. No pneumothorax or pleural effusion. No acute bone abnormality. IMPRESSION: 1. Progressive central pulmonary vascular congestion without superimposed overt pulmonary edema. Stable cardiomegaly. Electronically Signed   By: Helyn Numbers M.D.   On: 10/23/2023 01:45    ------  Elmon Kirschner, NP Pager: [phone]   Please contact the urology consult pager with any further questions/concerns.    The patient was discussed with me and I agree with the assessment and plan

## 2023-10-23 NOTE — ED Provider Notes (Addendum)
[facility] Emergency Department Provider Note MRN:  [id number]  Arrival date & time: 10/23/23     Chief Complaint   Chest Pain   History of Present Illness   Kyle Matthews is a 77 y.o. year-old male with a history of of CAD, aortic stenosis presenting to the ED with chief complaint of chest pain.  Sudden onset chest pain at 10:30 PM.  Left-sided squeezing pain, nonradiating.  Associated with some nausea.  Not going away.  Improved after nitro with EMS.  Review of Systems  A thorough review of systems was obtained and all systems are negative except as noted in the HPI and PMH.   Patient's Health History    Past Medical History:  Diagnosis Date   BPH (benign prostatic hyperplasia)    Status post biopsy in 2009. 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 GR 1 DD. No regional wall motion abnormality. Moderate aortic stenosis with moderate regurgitation. Mean gradient 34 mmHg.    Family History  Problem Relation Age of Onset   Coronary artery disease Mother 38       29. 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 Lives with his wife of 49 years. He is a retired Marine scientist used to work for [employer]   4 cups of coffee a day.    No structured exercise regimen   Social Determinants of Health   Financial Resource Strain: Not on file  Food Insecurity: No Food Insecurity (10/04/2023)  Hunger Vital Sign    Worried About Running Out of Food in the Last Year: Never true    Ran Out of Food in the Last Year: Never true  Transportation Needs: No Transportation Needs (10/04/2023)   PRAPARE - Administrator, Civil Service (Medical): No    Lack of Transportation (Non-Medical): No  Physical Activity: Not on file  Stress: Not on file  Social Connections: Not on file  Intimate Partner Violence: Not At Risk (10/04/2023)   Humiliation, Afraid, Rape, and Kick questionnaire    Fear of Current or Ex-Partner: No    Emotionally Abused: No    Physically Abused: No    Sexually Abused: No     Physical Exam   Vitals:   10/23/23 0315 10/23/23 0335  BP: (!) 80/56 (!) 88/59  Pulse: 98 95  Resp: (!) 24  (!) 23  Temp:    SpO2: 97% 97%    CONSTITUTIONAL: Well-appearing, NAD NEURO/PSYCH:  Alert and oriented x 3, no focal deficits EYES:  eyes equal and reactive ENT/NECK:  no LAD, no JVD CARDIO: Regular rate, well-perfused, normal S1 and S2 PULM:  CTAB no wheezing or rhonchi GI/GU:  non-distended, non-tender MSK/SPINE:  No gross deformities, no edema SKIN:  no rash, atraumatic   *Additional and/or pertinent findings included in MDM below  Diagnostic and Interventional Summary    EKG Interpretation Date/Time:  October 23, 2023 at 01:17:05 Ventricular Rate:   88 PR Interval:   185 QRS Duration:   145 QT Interval:   381 QTC Calculation:  461 R Axis:      Text Interpretation: Sinus rhythm, diffuse ST depressions with aVR elevation concerning for ischemia, possibly left main coronary artery disease or occlusion       Labs Reviewed  HEMOGLOBIN A1C - Abnormal; Notable for the following components:      Result Value   Hgb A1c MFr Bld 4.7 (*)    All other components within normal limits  CBC WITH DIFFERENTIAL/PLATELET - Abnormal; Notable for the following components:   WBC 10.9 (*)    RBC 1.86 (*)    Hemoglobin 5.2 (*)    HCT 17.4 (*)    MCHC 29.9 (*)    RDW 17.2 (*)    Neutro Abs 8.8 (*)    All other components within normal limits  PROTIME-INR - Abnormal; Notable for the following components:   Prothrombin Time 16.0 (*)    INR 1.3 (*)    All other components within normal limits  APTT - Abnormal; Notable for the following components:   aPTT >200 (*)    All other components within normal limits  COMPREHENSIVE METABOLIC PANEL - Abnormal; Notable for the following components:   Glucose, Bld 124 (*)    Calcium 8.2 (*)    Total Protein 4.9 (*)    Albumin 2.6 (*)    Alkaline Phosphatase 37 (*)    All other components within normal limits  I-STAT CHEM 8, ED - Abnormal; Notable for the following components:   BUN 26 (*)    Glucose, Bld 118 (*)    Hemoglobin 5.4 (*)     HCT 16.0 (*)    All other components within normal limits  TROPONIN I (HIGH SENSITIVITY) - Abnormal; Notable for the following components:   Troponin I (High Sensitivity) 369 (*)    All other components within normal limits  LIPID PANEL  I-STAT CG4 LACTIC ACID, ED  TYPE AND SCREEN  PREPARE RBC (CROSSMATCH)  TROPONIN  I (HIGH SENSITIVITY)    DG Chest Port 1 View  Final Result      Medications  0.9 %  sodium chloride infusion ( Intravenous New Bag/Given 10/23/23 0131)  0.9 %  sodium chloride infusion (Manually program via Guardrails IV Fluids) (0 mLs Intravenous Hold 10/23/23 0154)  furosemide (LASIX) injection 20 mg (has no administration in time range)  aspirin chewable tablet 324 mg (324 mg Oral Given 10/23/23 0130)  heparin injection 4,000 Units (4,000 Units Intravenous Given 10/23/23 0131)  calcium gluconate 1 g/ 50 mL sodium chloride IVPB (0 mg Intravenous Stopped 10/23/23 0250)     Procedures  /  Critical Care .Critical Care  Performed by: Sabas Sous, MD Authorized by: Sabas Sous, MD   Critical care provider statement:    Critical care time (minutes):  80   Critical care was necessary to treat or prevent imminent or life-threatening deterioration of the following conditions: Symptomatic anemia requiring blood transfusion, chest pain due to demand ischemia.   Critical care was time spent personally by me on the following activities:  Development of treatment plan with patient or surrogate, discussions with consultants, evaluation of patient's response to treatment, examination of patient, ordering and review of laboratory studies, ordering and review of radiographic studies, ordering and performing treatments and interventions, pulse oximetry, re-evaluation of patient's condition and review of old charts   ED Course and Medical Decision Making  Initial Impression and Ddx Patient presenting with concerning chest pain description, has known CAD, EKG showing significant  changes compared to most recent prior.  Per chart review has known left main disease which correlates with patient's isolated aVR elevation and diffuse depression.  Code STEMI initiated.  Aspirin and heparin given while awaiting labs, cardiology recommendations.  Hemodynamically doing well.  Pain improved after nitroglycerin with EMS.  1:53 AM update: I-STAT labs reveal hemoglobin of less than 6, which is more suggestive of demand ischemia.  Spoke with Dr. Excell Seltzer of cardiology over the phone.  Patient had a similar presentation with similar EKG changes early last month in the setting of low hemoglobin.  And so demand ischemia is favored rather than ruptured left main plaque.  STEMI alert canceled.  Cardiology will follow in consultation.  Will admit to medicine.  Providing blood transfusion.  2 AM update: Further history obtained from patient.  Was struggling with hematuria for the past week or 2.  Was told to stop his aspirin and Plavix about a week ago.  Has had no hematuria for the past 2 days, no other sources of bleeding.  Past medical/surgical history that increases complexity of ED encounter: CAD, aortic stenosis, enlarged prostate  Interpretation of Diagnostics I personally reviewed the EKG and my interpretation is as follows: Sinus rhythm with diffuse ST depressions, aVR elevation    Patient Reassessment and Ultimate Disposition/Management     Hospitalist admission.  Patient management required discussion with the following services or consulting groups:  Hospitalist Service and Cardiology  Complexity of Problems Addressed Acute illness or injury that poses threat of life of bodily function  Additional Data Reviewed and Analyzed Further history obtained from: EMS on arrival  Additional Factors Impacting ED Encounter Risk Use of parenteral controlled substances and Consideration of hospitalization  Elmer Sow. Pilar Plate, MD [facility] Emergency Medicine [facility]  Health [email or web address]  Final Clinical Impressions(s) / ED Diagnoses     ICD-10-CM   1. Symptomatic anemia  D64.9     2. Chest pain due to myocardial  ischemia, unspecified ischemic chest pain type  I25.9       ED Discharge Orders     None        Discharge Instructions Discussed with and Provided to Patient:   Discharge Instructions   None      Sabas Sous, MD 10/23/23 0300    Sabas Sous, MD 10/23/23 [phone]

## 2023-10-23 NOTE — ED Notes (Signed)
EKG performed and given to MD [facility] for review.

## 2023-10-23 NOTE — ED Notes (Addendum)
Pt ready for transport to cath lab. MD Bero at bedside.

## 2023-10-23 NOTE — ED Notes (Signed)
Patient's leg bag changed to standard drainage bag.

## 2023-10-24 ENCOUNTER — Inpatient Hospital Stay (HOSPITAL_COMMUNITY): Payer: Medicare Other

## 2023-10-24 DIAGNOSIS — I5030 Unspecified diastolic (congestive) heart failure: Secondary | ICD-10-CM | POA: Diagnosis not present

## 2023-10-24 DIAGNOSIS — I2510 Atherosclerotic heart disease of native coronary artery without angina pectoris: Secondary | ICD-10-CM

## 2023-10-24 DIAGNOSIS — I214 Non-ST elevation (NSTEMI) myocardial infarction: Secondary | ICD-10-CM | POA: Diagnosis not present

## 2023-10-24 LAB — CBC WITH DIFFERENTIAL/PLATELET
Abs Immature Granulocytes: 0.1 10*3/uL — ABNORMAL HIGH (ref 0.00–0.07)
Basophils Absolute: 0 10*3/uL (ref 0.0–0.1)
Basophils Relative: 0 %
Eosinophils Absolute: 0 10*3/uL (ref 0.0–0.5)
Eosinophils Relative: 0 %
HCT: 26.7 % — ABNORMAL LOW (ref 39.0–52.0)
Hemoglobin: 8.7 g/dL — ABNORMAL LOW (ref 13.0–17.0)
Immature Granulocytes: 1 %
Lymphocytes Relative: 14 %
Lymphs Abs: 1.5 10*3/uL (ref 0.7–4.0)
MCH: 29.7 pg (ref 26.0–34.0)
MCHC: 32.6 g/dL (ref 30.0–36.0)
MCV: 91.1 fL (ref 80.0–100.0)
Monocytes Absolute: 0.9 10*3/uL (ref 0.1–1.0)
Monocytes Relative: 9 %
Neutro Abs: 8.2 10*3/uL — ABNORMAL HIGH (ref 1.7–7.7)
Neutrophils Relative %: 76 %
Platelets: 177 10*3/uL (ref 150–400)
RBC: 2.93 MIL/uL — ABNORMAL LOW (ref 4.22–5.81)
RDW: 16 % — ABNORMAL HIGH (ref 11.5–15.5)
WBC: 10.8 10*3/uL — ABNORMAL HIGH (ref 4.0–10.5)
nRBC: 0.2 % (ref 0.0–0.2)

## 2023-10-24 LAB — COMPREHENSIVE METABOLIC PANEL
ALT: 17 U/L (ref 0–44)
AST: 20 U/L (ref 15–41)
Albumin: 2.6 g/dL — ABNORMAL LOW (ref 3.5–5.0)
Alkaline Phosphatase: 40 U/L (ref 38–126)
Anion gap: 8 (ref 5–15)
BUN: 27 mg/dL — ABNORMAL HIGH (ref 8–23)
CO2: 25 mmol/L (ref 22–32)
Calcium: 8.3 mg/dL — ABNORMAL LOW (ref 8.9–10.3)
Chloride: 102 mmol/L (ref 98–111)
Creatinine, Ser: 1.36 mg/dL — ABNORMAL HIGH (ref 0.61–1.24)
GFR, Estimated: 54 mL/min — ABNORMAL LOW (ref >60)
Glucose, Bld: 110 mg/dL — ABNORMAL HIGH (ref 70–99)
Potassium: 4.5 mmol/L (ref 3.5–5.1)
Sodium: 135 mmol/L (ref 135–145)
Total Bilirubin: 0.7 mg/dL (ref <1.2)
Total Protein: 5.4 g/dL — ABNORMAL LOW (ref 6.5–8.1)

## 2023-10-24 LAB — PHOSPHORUS: Phosphorus: 5.7 mg/dL — ABNORMAL HIGH (ref 2.5–4.6)

## 2023-10-24 LAB — MAGNESIUM: Magnesium: 1.9 mg/dL (ref 1.7–2.4)

## 2023-10-24 MED ORDER — FUROSEMIDE 10 MG/ML IJ SOLN
40.0000 mg | Freq: Once | INTRAMUSCULAR | Status: AC
  Administered 2023-10-24: 40 mg via INTRAVENOUS
  Filled 2023-10-24: qty 4

## 2023-10-24 MED ORDER — ALBUTEROL SULFATE (2.5 MG/3ML) 0.083% IN NEBU
2.5000 mg | INHALATION_SOLUTION | RESPIRATORY_TRACT | Status: DC | PRN
  Administered 2023-10-24 (×3): 2.5 mg via RESPIRATORY_TRACT
  Filled 2023-10-24 (×3): qty 3

## 2023-10-24 MED ORDER — FUROSEMIDE 10 MG/ML IJ SOLN
40.0000 mg | Freq: Two times a day (BID) | INTRAMUSCULAR | Status: DC
  Administered 2023-10-24 – 2023-10-26 (×3): 40 mg via INTRAVENOUS
  Filled 2023-10-24 (×3): qty 4

## 2023-10-24 NOTE — Progress Notes (Signed)
Patient with some respiratory distress overnight.  Positive accessory muscle use.  Oxygen increased from 2 L to 4 L..  40 mg IV Lasix given.  Nursing weaning oxygen.  CXR ordered.  Bladder scan showed, 750 cc in bladder.  Foley irrigation ordered.

## 2023-10-24 NOTE — Progress Notes (Addendum)
PROGRESS NOTE    Kyle Matthews  ZOX:[id number] DOB: [birth year] DOA: 10/23/2023 PCP: Kyle Palmer, MD  Chief Complaint  Patient presents with   Chest Pain    Brief Narrative:   Kyle Matthews is Kyle Matthews 77 y.o. male with medical history significant for severe aortic stenosis, BPH with recurrent gross hematuria, chronic blood loss anemia associated baseline hemoglobin 7.5-10, who is admitted to [facility] on 10/23/2023 with NSTEMI after presenting from home to [facility] ED complaining of chest pain.   He's been admitted with acute blood loss anemia with chest pain in the setting of CAD and elevated troponin with outpatient plan for CABG/AVR on 11/14.  Planning to keep inpatient until his procedure date.   See below for additional details.  Assessment & Plan:   Principal Problem:   NSTEMI (non-ST elevated myocardial infarction) (HCC) Active Problems:   Acute on chronic blood loss anemia   Severe aortic stenosis   BPH (benign prostatic hyperplasia)   Hyperlipidemia   Chest pain   History of essential hypertension   GERD (gastroesophageal reflux disease)  Acute Blood Loss Anemia Hematuria  BPH  Chronic Indwelling Foley Catheter  Urinary Retention He notes on/off hematuria since foley placement around August -> urine has now cleared after stopping DAPT Stools are light brown in color Retention last night resolved after flushing S/p 3 units pRBC, trend Urology consulted for his hematuria -> plan for androgen deprivation therapy.  If he fails to improve, will consider arterial embolizaiton of the prostate with IR.     Severe Aortic Stenosis  HFpEF Coronary Artery Disease  Elevated Troponin  Started on DAPT at discharge 10/24 to see if he'd tolerate -> saw with Dr. Lynnette Matthews on 10/30 and DAPT was stopped.  As he developed hematuria on DAPT, cardiac plan was for CABG and AVR (rather than PCI + TAVR). Appreciate cardiology recs -> pending CABG/AVR on 14th as above Antiplatelets and  anticoagulation on hold due to hematuria Crestor.  Beta blocker on hold with hypotension. Diuresis per cardiology  Acute Hypoxic Respiratory Failure Shortness of Breath Due to above Cxr with increased airspace opacity along R inferior perihilar border, trace bilateral effusion, pulmonary edema Diuresis as above per cards He reports improvement with nebs as well  Acute Kidney Injury Mild, follow with diuresis Note retention overnight (improved with flushing catheter) Low threshold for repeat bladder scan or renal US if worsening   Hypertension Currently hypotensive due to anemia above Hold antihypertensives  GERD PPI    DVT prophylaxis: SCD Code Status: full Family Communication: wife at bedside Disposition:   Status is: Inpatient Remains inpatient appropriate because: continued need for inpatient care   Consultants:  Urology cardiology  Procedures:  none  Antimicrobials:  Anti-infectives (From admission, onward)    None       Subjective: Felt better today after breathing treatment  Objective: Vitals:   10/24/23 0400 10/24/23 0509 10/24/23 0718 10/24/23 0742  BP: 123/70  (!) 103/53   Pulse: 86  76   Resp: 20  19   Temp: 98.5 F (36.9 C)  98.5 F (36.9 C)   TempSrc: Oral  Oral   SpO2: 94% 97% 96% 93%  Weight:      Height:        Intake/Output Summary (Last 24 hours) at 10/24/2023 1030 Last data filed at 10/24/2023 0827 Gross per 24 hour  Intake 1510.84 ml  Output 1601 ml  Net -90.16 ml   Filed Weights   10/23/23 0122 10/23/23 1559  Weight: 66.7 kg 69.1 kg    Examination:  General: No acute distress. Cardiovascular: RRR, systolic murmur  Lungs: bibasilar crackles S/NT/ND Neurological: Alert and oriented 3. Moves all extremities 4 with equal strength. Cranial nerves II through XII grossly intact. Extremities: trace dependent edema  Data Reviewed: I have personally reviewed following labs and imaging studies  CBC: Recent Labs  Lab  10/23/23 0129 10/23/23 0131 10/23/23 1700 10/24/23 0556  WBC 10.9*  --  10.0 10.8*  NEUTROABS 8.8*  --  7.3 8.2*  HGB 5.2* 5.4* 8.4* 8.7*  HCT 17.4* 16.0* 26.0* 26.7*  MCV 93.5  --  90.9 91.1  PLT 211  --  163 177    Basic Metabolic Panel: Recent Labs  Lab 10/23/23 0129 10/23/23 0131 10/23/23 0740 10/23/23 0800 10/24/23 0556  NA 137 137  --  136 135  K 4.2 4.2  --  4.1 4.5  CL 106 103  --  105 102  CO2 24  --   --  21* 25  GLUCOSE 124* 118*  --  106* 110*  BUN 21 26*  --  18 27*  CREATININE 0.94 1.00  --  0.90 1.36*  CALCIUM 8.2*  --   --  8.4* 8.3*  MG  --   --  1.7 1.7 1.9  PHOS  --   --   --   --  5.7*    GFR: Estimated Creatinine Clearance: 41 mL/min (Kyle Matthews) (by C-G formula based on SCr of 1.36 mg/dL (H)).  Liver Function Tests: Recent Labs  Lab 10/23/23 0129 10/23/23 0800 10/24/23 0556  AST 27 19 20   ALT 18 18 17   ALKPHOS 37* 37* 40  BILITOT 0.4 1.2* 0.7  PROT 4.9* 5.1* 5.4*  ALBUMIN 2.6* 2.7* 2.6*    CBG: No results for input(s): "GLUCAP" in the last 168 hours.   Recent Results (from the past 240 hour(s))  Urine Culture     Status: Abnormal   Collection Time: 10/15/23  5:18 AM   Specimen: Urine, Catheterized  Result Value Ref Range Status   Specimen Description URINE, CATHETERIZED  Final   Special Requests   Final    NONE Performed at [facility], [address] [place], [place] [address]    Culture 10,000 COLONIES/mL ENTEROBACTER CLOACAE (Kyle Matthews)  Final   Report Status 10/21/2023 FINAL  Final   Organism ID, Bacteria ENTEROBACTER CLOACAE (Kyle Matthews)  Final      Susceptibility   Enterobacter cloacae - MIC*    CEFEPIME 2 SENSITIVE Sensitive     CIPROFLOXACIN <=0.25 SENSITIVE Sensitive     GENTAMICIN <=1 SENSITIVE Sensitive     IMIPENEM 0.5 SENSITIVE Sensitive     NITROFURANTOIN 256 RESISTANT Resistant     TRIMETH/SULFA <=20 SENSITIVE Sensitive     PIP/TAZO >=128 RESISTANT Resistant ug/mL    * 10,000 COLONIES/mL ENTEROBACTER CLOACAE          Radiology Studies: DG CHEST PORT 1 VIEW  Result Date: 10/24/2023 CLINICAL DATA:  Shortness of breath. History of mitral valve replacement EXAM: PORTABLE CHEST 1 VIEW COMPARISON:  October 23, 2023, October 10, 2023, October 05, 2023 FINDINGS: The cardiomediastinal silhouette is unchanged and enlarged in contour.Atherosclerotic calcifications of the tortuous thoracic aorta. Trace bilateral pleural effusions. No pneumothorax. Diffuse interstitial prominence, similar in comparison to priors. Increased airspace opacity along the RIGHT inferior perihilar border. Visualized abdomen is unremarkable. IMPRESSION: 1. Increased airspace opacity along the RIGHT inferior perihilar border. This could reflect atelectasis versus infection versus confluent  edema. 2. Trace bilateral pleural effusions with Misha Antonini background of otherwise relatively similar pulmonary edema. Electronically Signed   By: Meda Klinefelter M.D.   On: 10/24/2023 07:36   DG Chest Port 1 View  Result Date: 10/23/2023 CLINICAL DATA:  Chest pain EXAM: PORTABLE CHEST 1 VIEW COMPARISON:  10/05/2023 FINDINGS: The lungs are symmetrically well expanded. There is progressive central pulmonary vascular congestion without superimposed overt pulmonary edema. Mild cardiomegaly is stable. No confluent pulmonary infiltrate. No pneumothorax or pleural effusion. No acute bone abnormality. IMPRESSION: 1. Progressive central pulmonary vascular congestion without superimposed overt pulmonary edema. Stable cardiomegaly. Electronically Signed   By: Helyn Numbers M.D.   On: 10/23/2023 01:45        Scheduled Meds:  sodium chloride   Intravenous Once   Chlorhexidine Gluconate Cloth  6 each Topical Daily   finasteride  5 mg Oral QPM   furosemide  40 mg Intravenous BID   pantoprazole  40 mg Oral Daily   rosuvastatin  40 mg Oral Daily   Continuous Infusions:     LOS: 1 day    Time spent: over 30 min     Lacretia Nicks, MD [facility]   To contact the attending provider between 7A-7P or the covering provider during after hours 7P-7A, please log into the web site [email or web address] and access using universal [place] password for that web site. If you do not have the password, please call the hospital operator.  10/24/2023, 10:30 AM

## 2023-10-24 NOTE — Plan of Care (Signed)
  Problem: Education: Goal: Knowledge of General Education information will improve Description: Including pain rating scale, medication(s)/side effects and non-pharmacologic comfort measures Outcome: Progressing   Problem: Clinical Measurements: Goal: Ability to maintain clinical measurements within normal limits will improve Outcome: Progressing Goal: Will remain free from infection Outcome: Progressing Goal: Respiratory complications will improve Outcome: Progressing Goal: Cardiovascular complication will be avoided Outcome: Progressing   Problem: Activity: Goal: Risk for activity intolerance will decrease Outcome: Progressing   Problem: Pain Management: Goal: General experience of comfort will improve Outcome: Progressing

## 2023-10-24 NOTE — Progress Notes (Signed)
Pt urine output after iv lasix 40mg  x1 given 150cc. Bladder scan results 750cc. Pt did endorse feeling bladder pressure. Pt also stated he normally irrigates his catheter at home 1-2 day. Provider on call notified. Request for bladder irrigation. S/p irrigation pt expressed relief. Several small, thick appearing clots removed. PRN breathing tx also added by provider. RT notified.

## 2023-10-24 NOTE — Progress Notes (Signed)
Rounding Note    Patient Name: Kyle Matthews Date of Encounter: 10/24/2023  [facility] Cardiologist: Maisie Fus, MD   Subjective  He is doing well this morning and feels better.  He did have acute shortness of breath overnight that was managed with breathing treatment  Interval history: Mr. Wenig presented with hematuria and hemoglobin of 5.4.  He also had diffuse ST elevation on his EKG and mild aVR elevation.  He underwent blood transfusions.  He was seen by urology that would recommend prostatectomy however he is high risk for surgery.  He is currently on androgen deprivation therapy to help with his hematuria prior to his CABG/AVR.  His kidney function bumped from normal Troponin 482-> 1234   Inpatient Medications    Scheduled Meds:  sodium chloride   Intravenous Once   Chlorhexidine Gluconate Cloth  6 each Topical Daily   finasteride  5 mg Oral QPM   pantoprazole  40 mg Oral Daily   rosuvastatin  40 mg Oral Daily   Continuous Infusions:   PRN Meds: acetaminophen **OR** acetaminophen, albuterol, melatonin, morphine injection, ondansetron (ZOFRAN) IV, oxyCODONE   Vital Signs    Vitals:   10/24/23 0400 10/24/23 0509 10/24/23 0718 10/24/23 0742  BP: 123/70  (!) 103/53   Pulse: 86  76   Resp: 20  19   Temp: 98.5 F (36.9 C)  98.5 F (36.9 C)   TempSrc: Oral  Oral   SpO2: 94% 97% 96% 93%  Weight:      Height:        Intake/Output Summary (Last 24 hours) at 10/24/2023 0932 Last data filed at 10/24/2023 0827 Gross per 24 hour  Intake 1510.84 ml  Output 1601 ml  Net -90.16 ml      10/23/2023    3:59 PM 10/23/2023    1:22 AM 10/18/2023    9:11 AM  Last 3 Weights  Weight (lbs) 152 lb 5.4 oz 147 lb 149 lb 3.2 oz  Weight (kg) 69.1 kg 66.679 kg 67.677 kg      Telemetry    Sinus rhythm - Personally Reviewed  ECG  No new  Initial Sinus rhythm with diffuse ST depression and mild aVR elevation- Personally Reviewed  Physical Exam   Vitals:    10/24/23 0718 10/24/23 0742  BP: (!) 103/53   Pulse: 76   Resp: 19   Temp: 98.5 F (36.9 C)   SpO2: 96% 93%    GEN: No acute distress.  Deconditioned Neck: + JVD Cardiac: RRR, SEM RUSB that radiates to the apex Respiratory: nl wob, decreased breath sounds in the bases GI:  non-distended  MS: Bilateral trace lower extremity edema Neuro:  Nonfocal  Psych: Normal affect   Labs    High Sensitivity Troponin:   Recent Labs  Lab 10/03/23 1521 10/04/23 0505 10/23/23 0129 10/23/23 0740 10/23/23 1700  TROPONINIHS 4,814* 3,357* 369* 482* 1,234*     Chemistry Recent Labs  Lab 10/23/23 0129 10/23/23 0131 10/23/23 0740 10/23/23 0800 10/24/23 0556  NA 137 137  --  136 135  K 4.2 4.2  --  4.1 4.5  CL 106 103  --  105 102  CO2 24  --   --  21* 25  GLUCOSE 124* 118*  --  106* 110*  BUN 21 26*  --  18 27*  CREATININE 0.94 1.00  --  0.90 1.36*  CALCIUM 8.2*  --   --  8.4* 8.3*  MG  --   --  1.7 1.7 1.9  PROT 4.9*  --   --  5.1* 5.4*  ALBUMIN 2.6*  --   --  2.7* 2.6*  AST 27  --   --  19 20  ALT 18  --   --  18 17  ALKPHOS 37*  --   --  37* 40  BILITOT 0.4  --   --  1.2* 0.7  GFRNONAA >60  --   --  >60 54*  ANIONGAP 7  --   --  10 8    Lipids  Recent Labs  Lab 10/23/23 0129  CHOL 122  TRIG 74  HDL 52  LDLCALC 55  CHOLHDL 2.3    Hematology Recent Labs  Lab 10/23/23 0129 10/23/23 0131 10/23/23 1700 10/24/23 0556  WBC 10.9*  --  10.0 10.8*  RBC 1.86*  --  2.86* 2.93*  HGB 5.2* 5.4* 8.4* 8.7*  HCT 17.4* 16.0* 26.0* 26.7*  MCV 93.5  --  90.9 91.1  MCH 28.0  --  29.4 29.7  MCHC 29.9*  --  32.3 32.6  RDW 17.2*  --  15.9* 16.0*  PLT 211  --  163 177   Thyroid No results for input(s): "TSH", "FREET4" in the last 168 hours.  BNPNo results for input(s): "BNP", "PROBNP" in the last 168 hours.  DDimer No results for input(s): "DDIMER" in the last 168 hours.   Radiology    DG CHEST PORT 1 VIEW  Result Date: 10/24/2023 CLINICAL DATA:  Shortness of breath.  History of mitral valve replacement EXAM: PORTABLE CHEST 1 VIEW COMPARISON:  October 23, 2023, October 10, 2023, October 05, 2023 FINDINGS: The cardiomediastinal silhouette is unchanged and enlarged in contour.Atherosclerotic calcifications of the tortuous thoracic aorta. Trace bilateral pleural effusions. No pneumothorax. Diffuse interstitial prominence, similar in comparison to priors. Increased airspace opacity along the RIGHT inferior perihilar border. Visualized abdomen is unremarkable. IMPRESSION: 1. Increased airspace opacity along the RIGHT inferior perihilar border. This could reflect atelectasis versus infection versus confluent edema. 2. Trace bilateral pleural effusions with a background of otherwise relatively similar pulmonary edema. Electronically Signed   By: Meda Klinefelter M.D.   On: 10/24/2023 07:36   DG Chest Port 1 View  Result Date: 10/23/2023 CLINICAL DATA:  Chest pain EXAM: PORTABLE CHEST 1 VIEW COMPARISON:  10/05/2023 FINDINGS: The lungs are symmetrically well expanded. There is progressive central pulmonary vascular congestion without superimposed overt pulmonary edema. Mild cardiomegaly is stable. No confluent pulmonary infiltrate. No pneumothorax or pleural effusion. No acute bone abnormality. IMPRESSION: 1. Progressive central pulmonary vascular congestion without superimposed overt pulmonary edema. Stable cardiomegaly. Electronically Signed   By: Helyn Numbers M.D.   On: 10/23/2023 01:45    Cardiac Studies   TTE 10/03/2023 1. Left ventricular ejection fraction, by estimation, is 55 to 60%. The  left ventricle has normal function. The left ventricle has no regional  wall motion abnormalities. There is mild left ventricular hypertrophy.  Left ventricular diastolic parameters  are indeterminate. Elevated left atrial pressure.   2. Right ventricular systolic function is normal. The right ventricular size is normal. There is mildly elevated pulmonary artery systolic  pressure.   3. Left atrial size was moderately dilated.   4. Right atrial size was moderately dilated.   5. The mitral valve is abnormal. Mild to moderate mitral valve regurgitation. No evidence of mitral stenosis.   6. The tricuspid valve is abnormal.   7. The aortic valve was not well visualized. There is severe calcifcation of  the aortic valve. There is severe thickening of the aortic valve.  Aortic valve regurgitation is moderate to severe. Severe aortic valve stenosis. Severe aortic stenosis is  present. Aortic valve mean gradient measures 42.0 mmHg. Aortic valve peak gradient measures 69.2 mmHg. Aortic valve area, by VTI measures 0.89 cm.   8. The inferior vena cava is dilated in size with >50% respiratory variability, suggesting right atrial pressure of 8 mmHg.    LHC 10/09/2023   Ost LM lesion is 50% stenosed.   1.  Moderate left main stenosis with an RFR of 0.83. 2.  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 3.  Fick cardiac output of 6.5 L/min and Fick cardiac index of 3.8 L/min/m with the following hemodynamics:             Right atrial pressure mean of 7             Right ventricular pressure 42/-2 with an end-diastolic pressure of 8 mmHg             Wedge pressure mean of 14 mmHg with V waves to 23 mmHg             PA pressure of 38/16 with a mean of 24 mmHg             PVR of 1.5 Woods units             PA pulsatility index of 3.1   Recommendation: Continue evaluation for aortic valve intervention.  Will obtain TAVR protocol CTA tomorrow.  Patient Profile     77 y.o. 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   Assessment & Plan    Bladder outlet obstruction Hematuria -Status post blood transfusion -He has been evaluated by urology and the recommendation is to conduct a  prostatectomy at some point.  For now since he is high risk he has been started on an androgen deprivation therapy.  CAD- 50% LM -His EKG shows diffuse ST depression and some mild elevation in aVR.  He has known 50% left main disease.  He is pending a CABG on the 14th.  CT surgery is aware that he is here and the plan is to continue to wait for his scheduled date. -Antiplatelets are on hold secondary to hematuria, as well cannot do anticoagulation -Continue Crestor 40 mg daily -He is hypotensive holding off on beta-blocker  HFpEF -Will increase his Lasix to 40 IV twice BID  Severe AS -He is pending a TAVR  Overall, he has multiple comorbidities in the setting of hematuria and anemia.  Hopefully with the initiation of androgen deprivation therapy his hematuria will improve.  Otherwise with his tenuous status he will stay until his procedure and receive medical management;  (CABG/TAVR) 11/14.   Time Spent Directly with Patient:  I have spent a total of 35 minutes with the patient reviewing hospital notes, telemetry, EKGs, labs and examining the patient as well as establishing an assessment and plan that was discussed personally with the patient.  > 50% of time was spent in direct patient care.      For questions or updates, please contact [facility] Please consult [email or web address] for contact info under        Signed, Maisie Fus, MD  10/24/2023, 9:32 AM

## 2023-10-24 NOTE — Progress Notes (Signed)
Chaplain responded to a consult request for Advance Directive education. Kyle Matthews shared that he and his wife have been meaning to complete these documents for some time. He anticipates being inpatient until his surgery on the 15th.   Chaplain provided the Advance Directive packet as well as education on Advance Directives-documents an individual completes to communicate their health care directions in advance of a time when they may need them. Chaplain informed pt the documents which may be completed here in the hospital are the Living Will and Health Care Power of [place].   Chaplain informed that the Health Care Power of [place] is a legal document in which an individual names another person, their Health Care Agent, to make health care decisions when the individual is not able to make them for themselves. The Health Care Agent's function can be temporary or permanent depending on the pt's ability to make and communicate those decisions independently. Chaplain informed pt in the absence of a Health Care Power of [place], the state of [place] directs health care providers to look to the following individuals in the order listed: legal guardian; an attorney?in?fact under a general power of attorney (POA) if that POA includes the right to make health care decisions; a husband or wife; a majority of parents and adult children; a majority of adult brothers and sisters; or an individual who has an established relationship with you, who is acting in good faith and who can convey your wishes.  If none of these person are available or willing to make medical decisions on a patient's behalf, the law allows the patient's doctor to make decisions for them as long as another doctor agrees with those decisions.  Chaplain also informed the patient that the Health Care agent has no decision-making authority over any affairs other than those related to his or her medical care.   The chaplain further educated the pt  that a Living Will is a legal document that allows an individual to state his or her desire not to receive life-prolonging measures in the event that they have a condition that is incurable and will result in their death in a short period of time; they are unconscious, and doctors are confident that they will not regain consciousness; and/or they have advanced dementia or other substantial and irreversible loss of mental function. The chaplain informed pt that life-prolonging measures are medical treatments that would only serve to postpone death, including breathing machines, kidney dialysis, antibiotics, artificial nutrition and hydration (tube feeding), and similar forms of treatment and that if an individual is able to express their wishes, they may also make them known without the use of a Living Will, but in the event that an individual is not able to express their wishes themselves, a Living Will allows medical providers and the pt's family and friends ensure that they are not making decisions on the pt's behalf, but rather serving as the pt's voice to convey decisions the pt has already made.   The patient is aware that the decision to create an advance directive is theirs alone and they may chose not to complete the documents or may chose to complete one portion or both.  The patient was informed that they can revoke the documents at any time by striking through them and writing void or by completing new documents, but that it is also advisable that the individual verbally notify interested parties that their wishes have changed.  They are also aware that the document must  be signed in the presence of a notary public and two witnesses and that this can be done while the patient is still admitted to the hospital or after discharge in the community. If they decide to complete Advance Directives after being discharged from the hospital, they have been advised to notify all interested parties and to provide  those documents to their physicians and loved ones in addition to bringing them to the hospital in the event of another hospitalization.   The chaplain informed the pt that if they desire to proceed with completing Advance Directive Documentation while they are still admitted, notary services are typically available at [facility] between the hours of 1:00 and 3:30 Monday-Thursday.    When the patient is ready to have these documents completed, the patient should request that their nurse place a spiritual care consult and indicate that the patient is ready to have their advance directives notarized so that arrangements for witnesses and notary public can be made.  Please page spiritual care if the patient desires further education or has questions.     Maryanna Shape. Carley Hammed, M.Div. [facility] Chaplain Pager [phone] Office [phone]      10/24/23 1105  Spiritual Encounters  Type of Visit Initial  Care provided to: Pt and family  Reason for visit Advance directives  Advance Directives (For Healthcare)  Does Patient Have a Medical Advance Directive? No  Would patient like information on creating a medical advance directive? Yes (Inpatient - patient defers creating a medical advance directive at this time - Information given)

## 2023-10-24 NOTE — Plan of Care (Signed)

## 2023-10-24 NOTE — Progress Notes (Addendum)
     Subjective: No acute events overnight.  Patient still has mild hematuria but hemoglobin has improved since his initial transfusion.  Objective: Vital signs in last 24 hours: Temp:  [98.3 F (36.8 C)-100.4 F (38 C)] 98.5 F (36.9 C) (11/05 0718) Pulse Rate:  [71-87] 76 (11/05 0718) Resp:  [18-28] 19 (11/05 0718) BP: (85-123)/(51-70) 103/53 (11/05 0718) SpO2:  [93 %-100 %] 93 % (11/05 0742) FiO2 (%):  [100 %] 100 % (11/04 1154) Weight:  [69.1 kg] 69.1 kg (11/04 1559)  Assessment/Plan: #ABLA #Hematuria   Loading dose of Firmagon provided yesterday.  Will continue ADT through prostate surgery in an effort to reduce prostate size, vascularity, and bleeding.   Continue to monitor hemoglobin.  Small interval improvement of HgB since transfusion of 3u PRBC on arrival.  Failing the present intervention, could consider prostate artery embolization with interventional radiology.  Further surgical intervention will be his eventual prostatectomy once he is stable from a cardiac perspective.  Discussed with patient and his wife that he would be better served by having frequent CBCs  between now and his cardiac catheterization. Urology will sign off at this time.  Please feel free to call with questions.  Intake/Output from previous day: 11/04 0701 - 11/05 0700 In: 1192.8 [P.O.:297; I.V.:115.8; Blood:630] Out: 1601 [Urine:1601]  Intake/Output this shift: Total I/O In: 318 [P.O.:318] Out: -   Physical Exam:  General: Alert and oriented CV: No cyanosis Lungs: equal chest rise Gu: foley in place draining light red clear urine  Lab Results: Recent Labs    10/23/23 0131 10/23/23 1700 10/24/23 0556  HGB 5.4* 8.4* 8.7*  HCT 16.0* 26.0* 26.7*   BMET Recent Labs    10/23/23 0800 10/24/23 0556  NA 136 135  K 4.1 4.5  CL 105 102  CO2 21* 25  GLUCOSE 106* 110*  BUN 18 27*  CREATININE 0.90 1.36*  CALCIUM 8.4* 8.3*     Studies/Results: DG CHEST PORT 1 VIEW  Result Date:  10/24/2023 CLINICAL DATA:  Shortness of breath. History of mitral valve replacement EXAM: PORTABLE CHEST 1 VIEW COMPARISON:  October 23, 2023, October 10, 2023, October 05, 2023 FINDINGS: The cardiomediastinal silhouette is unchanged and enlarged in contour.Atherosclerotic calcifications of the tortuous thoracic aorta. Trace bilateral pleural effusions. No pneumothorax. Diffuse interstitial prominence, similar in comparison to priors. Increased airspace opacity along the RIGHT inferior perihilar border. Visualized abdomen is unremarkable. IMPRESSION: 1. Increased airspace opacity along the RIGHT inferior perihilar border. This could reflect atelectasis versus infection versus confluent edema. 2. Trace bilateral pleural effusions with a background of otherwise relatively similar pulmonary edema. Electronically Signed   By: Meda Klinefelter M.D.   On: 10/24/2023 07:36   DG Chest Port 1 View  Result Date: 10/23/2023 CLINICAL DATA:  Chest pain EXAM: PORTABLE CHEST 1 VIEW COMPARISON:  10/05/2023 FINDINGS: The lungs are symmetrically well expanded. There is progressive central pulmonary vascular congestion without superimposed overt pulmonary edema. Mild cardiomegaly is stable. No confluent pulmonary infiltrate. No pneumothorax or pleural effusion. No acute bone abnormality. IMPRESSION: 1. Progressive central pulmonary vascular congestion without superimposed overt pulmonary edema. Stable cardiomegaly. Electronically Signed   By: Helyn Numbers M.D.   On: 10/23/2023 01:45      LOS: 1 day   Elmon Kirschner, NP [facility] Pager: [phone]  10/24/2023, 10:52 AM

## 2023-10-24 NOTE — Progress Notes (Signed)
Mobility Specialist Progress Note:    10/24/23 1156  Mobility  Activity Ambulated with assistance in hallway  Level of Assistance Contact guard assist, steadying assist  Assistive Device None  Distance Ambulated (ft) 250 ft  Activity Response Tolerated well  Mobility Referral Yes  $Mobility charge 1 Mobility  Mobility Specialist Start Time (ACUTE ONLY) [id number]  Mobility Specialist Stop Time (ACUTE ONLY) 0950  Mobility Specialist Time Calculation (min) (ACUTE ONLY) 11 min   Received pt in bed having no complaints and agreeable to mobility. Towards EOS pt c/o mild SOB but SPO2 was 97% on 3L/min. Returned to room w/o fault. Left in bed w/ call bell in reach and all needs met. Left on 3L/min.   Thompson Grayer Mobility Specialist  Please contact vis Secure Chat or  Rehab Office [phone]

## 2023-10-25 DIAGNOSIS — I2510 Atherosclerotic heart disease of native coronary artery without angina pectoris: Secondary | ICD-10-CM | POA: Diagnosis not present

## 2023-10-25 DIAGNOSIS — I5030 Unspecified diastolic (congestive) heart failure: Secondary | ICD-10-CM | POA: Diagnosis not present

## 2023-10-25 DIAGNOSIS — I214 Non-ST elevation (NSTEMI) myocardial infarction: Secondary | ICD-10-CM | POA: Diagnosis not present

## 2023-10-25 LAB — CBC WITH DIFFERENTIAL/PLATELET
Abs Immature Granulocytes: 0.05 10*3/uL (ref 0.00–0.07)
Basophils Absolute: 0 10*3/uL (ref 0.0–0.1)
Basophils Relative: 0 %
Eosinophils Absolute: 0 10*3/uL (ref 0.0–0.5)
Eosinophils Relative: 1 %
HCT: 22.5 % — ABNORMAL LOW (ref 39.0–52.0)
Hemoglobin: 7.2 g/dL — ABNORMAL LOW (ref 13.0–17.0)
Immature Granulocytes: 1 %
Lymphocytes Relative: 14 %
Lymphs Abs: 1.1 10*3/uL (ref 0.7–4.0)
MCH: 28.5 pg (ref 26.0–34.0)
MCHC: 32 g/dL (ref 30.0–36.0)
MCV: 88.9 fL (ref 80.0–100.0)
Monocytes Absolute: 0.6 10*3/uL (ref 0.1–1.0)
Monocytes Relative: 7 %
Neutro Abs: 6.3 10*3/uL (ref 1.7–7.7)
Neutrophils Relative %: 77 %
Platelets: 164 10*3/uL (ref 150–400)
RBC: 2.53 MIL/uL — ABNORMAL LOW (ref 4.22–5.81)
RDW: 15.7 % — ABNORMAL HIGH (ref 11.5–15.5)
WBC: 8.1 10*3/uL (ref 4.0–10.5)
nRBC: 0 % (ref 0.0–0.2)

## 2023-10-25 LAB — COMPREHENSIVE METABOLIC PANEL
ALT: 13 U/L (ref 0–44)
AST: 12 U/L — ABNORMAL LOW (ref 15–41)
Albumin: 2.1 g/dL — ABNORMAL LOW (ref 3.5–5.0)
Alkaline Phosphatase: 39 U/L (ref 38–126)
Anion gap: 6 (ref 5–15)
BUN: 29 mg/dL — ABNORMAL HIGH (ref 8–23)
CO2: 26 mmol/L (ref 22–32)
Calcium: 7.8 mg/dL — ABNORMAL LOW (ref 8.9–10.3)
Chloride: 103 mmol/L (ref 98–111)
Creatinine, Ser: 1.03 mg/dL (ref 0.61–1.24)
GFR, Estimated: >60 mL/min (ref >60)
Glucose, Bld: 103 mg/dL — ABNORMAL HIGH (ref 70–99)
Potassium: 3.6 mmol/L (ref 3.5–5.1)
Sodium: 135 mmol/L (ref 135–145)
Total Bilirubin: 0.7 mg/dL (ref <1.2)
Total Protein: 4.6 g/dL — ABNORMAL LOW (ref 6.5–8.1)

## 2023-10-25 LAB — HEMOGLOBIN AND HEMATOCRIT, BLOOD
HCT: 23.2 % — ABNORMAL LOW (ref 39.0–52.0)
HCT: 24.7 % — ABNORMAL LOW (ref 39.0–52.0)
Hemoglobin: 7.3 g/dL — ABNORMAL LOW (ref 13.0–17.0)
Hemoglobin: 7.9 g/dL — ABNORMAL LOW (ref 13.0–17.0)

## 2023-10-25 LAB — PREPARE RBC (CROSSMATCH)

## 2023-10-25 LAB — PHOSPHORUS: Phosphorus: 4.3 mg/dL (ref 2.5–4.6)

## 2023-10-25 LAB — MAGNESIUM: Magnesium: 2.2 mg/dL (ref 1.7–2.4)

## 2023-10-25 MED ORDER — HYDRALAZINE HCL 20 MG/ML IJ SOLN: 10.0000 mg | INTRAMUSCULAR | Status: DC | PRN

## 2023-10-25 MED ORDER — SENNOSIDES-DOCUSATE SODIUM 8.6-50 MG PO TABS
1.0000 | ORAL_TABLET | Freq: Every evening | ORAL | Status: DC | PRN
  Administered 2023-10-26: 1 via ORAL
  Filled 2023-10-25: qty 1

## 2023-10-25 MED ORDER — SODIUM CHLORIDE 0.9% IV SOLUTION: Freq: Once | INTRAVENOUS | Status: AC

## 2023-10-25 MED ORDER — METOPROLOL TARTRATE 5 MG/5ML IV SOLN: 5.0000 mg | INTRAVENOUS | Status: DC | PRN

## 2023-10-25 MED ORDER — IPRATROPIUM-ALBUTEROL 0.5-2.5 (3) MG/3ML IN SOLN
3.0000 mL | RESPIRATORY_TRACT | Status: DC | PRN
  Administered 2023-10-25 – 2023-11-04 (×8): 3 mL via RESPIRATORY_TRACT
  Filled 2023-10-25 (×8): qty 3

## 2023-10-25 NOTE — Progress Notes (Addendum)
     Subjective: Patient catheter stopped flowing overnight.  Re-engaged this morning to assess  Objective: Vital signs in last 24 hours: Temp:  [98.4 F (36.9 C)-99.4 F (37.4 C)] 98.4 F (36.9 C) (11/06 0724) Pulse Rate:  [69-81] 69 (11/06 0529) Resp:  [18] 18 (11/06 0529) BP: (88-119)/(48-61) 88/60 (11/06 0724) SpO2:  [93 %-98 %] 97 % (11/06 0529) Weight:  [70.5 kg] 70.5 kg (11/06 0728)  Assessment/Plan: #ABLA #Hematuria   Loading dose of Firmagon provided yesterday.  Will continue ADT through prostate surgery in an effort to reduce prostate size, vascularity, and bleeding.   Ongoing bleeding requiring additional transfusion today. Foley catheter for three-way 75f hematuria coud.  Hand irrigated and initiated CBI.  Nursing to hand irrigate as necessary Discussed w/ Dr. Berneice Heinrich. Prostate artery embolization requested today.    Intake/Output from previous day: 11/05 0701 - 11/06 0700 In: 995 [P.O.:735] Out: 1950 [Urine:1950]  Intake/Output this shift: No intake/output data recorded.  Physical Exam:  General: Alert and oriented CV: No cyanosis Lungs: equal chest rise Gu: 3-way 7f hematuria daily.  CBI on low gtt.  Lab Results: Recent Labs    10/24/23 0556 10/25/23 0401 10/25/23 0755  HGB 8.7* 7.2* 7.3*  HCT 26.7* 22.5* 23.2*   BMET Recent Labs    10/24/23 0556 10/25/23 0401  NA 135 135  K 4.5 3.6  CL 102 103  CO2 25 26  GLUCOSE 110* 103*  BUN 27* 29*  CREATININE 1.36* 1.03  CALCIUM 8.3* 7.8*     Studies/Results: DG CHEST PORT 1 VIEW  Result Date: 10/24/2023 CLINICAL DATA:  Shortness of breath. History of mitral valve replacement EXAM: PORTABLE CHEST 1 VIEW COMPARISON:  October 23, 2023, October 10, 2023, October 05, 2023 FINDINGS: The cardiomediastinal silhouette is unchanged and enlarged in contour.Atherosclerotic calcifications of the tortuous thoracic aorta. Trace bilateral pleural effusions. No pneumothorax. Diffuse interstitial prominence,  similar in comparison to priors. Increased airspace opacity along the RIGHT inferior perihilar border. Visualized abdomen is unremarkable. IMPRESSION: 1. Increased airspace opacity along the RIGHT inferior perihilar border. This could reflect atelectasis versus infection versus confluent edema. 2. Trace bilateral pleural effusions with a background of otherwise relatively similar pulmonary edema. Electronically Signed   By: Meda Klinefelter M.D.   On: 10/24/2023 07:36      LOS: 2 days   Elmon Kirschner, NP [facility] Pager: [phone]  10/25/2023, 10:45 AM

## 2023-10-25 NOTE — Consult Note (Signed)
[facility] Liaison Consult Note   10/25/2023  Kyle Matthews DOB: Jun 16, 1946 MRN: [id number]  Insurance:  [place] [employer] Medicare  Primary Care Provider: Mila Palmer, MD with Debor[facility] Sprang at [place], this provider is listed for the transition of care follow up appointments  and transition calls   [facility] Liaison met patient at bedside at [facility]. Met with patient and wife both resting in the room.  Introduced self and reason for visit.    The patient was screened for 30 day readmission hospitalization with noted high risk score for unplanned readmission risk with 2 ED and 2 hospital admissions in 6 months.  The patient was assessed for potential Community Care Coordination service needs for post hospital transition for care coordination. Review of patient's electronic medical record reveals patient is showing SDOH for food insecurity however, patient denies issues, currently.   Plan: [facility] Liaison will continue to follow progress and disposition to asess for post hospital community care coordination/management needs.  Referral request for community care coordination: will follow up with [facility] transition team anticipated and can update team of any needs.   [facility] Community Care Management/Population Health does not replace or interfere with any arrangements made by the Inpatient Transition of Care team.   For questions contact:   Charlesetta Shanks, RN, BSN, CCM [place]  [facility] Liaison Direct Dial: [phone] or secure chat Email: [email or web address]

## 2023-10-25 NOTE — TOC Initial Note (Signed)
Transition of Care [facility]) - Initial/Assessment Note    Patient Details  Name: Kyle Matthews MRN: [id number] Date of Birth: 1946/07/07  Transition of Care [facility]) CM/SW Contact:    Leone Haven, RN Phone Number: 10/25/2023, 3:46 PM  Clinical Narrative:                 From home with spouse, has PCP and insurance on file, states is active with [facility] services but can not remember the name,  Brandii with [facility] has confirmed patient is active with them for [facility] for catherter care, patient would like to continue, he has no  DME at home.  States family member will transport them home at [employer] and family is support system, states gets medications from [facility], in [place] [address]   Pta self ambulatory.  Continues to receive Bladder irrigation as well. Plan for Cardiac surgery on the 14th per MD notes.    Expected Discharge Plan: Home w Home Health Services Barriers to Discharge: Continued Medical Work up   Patient Goals and CMS Choice Patient states their goals for this hospitalization and ongoing recovery are:: return home CMS Medicare.gov Compare Post Acute Care list provided to:: Patient Choice offered to / list presented to : Patient      Expected Discharge Plan and Services In-house Referral: NA Discharge Planning Services: CM Consult Post Acute Care Choice: Home Health Living arrangements for the past 2 months: Single Family Home                 DME Arranged: N/A DME Agency: NA       HH Arranged: RN HH Agency: [facility] Date HH Agency Contacted: 10/25/23 Time HH Agency Contacted: 1545 Representative spoke with at [facility] Agency: Brandi  Prior Living Arrangements/Services Living arrangements for the past 2 months: Single Family Home Lives with:: Spouse Patient language and need for interpreter reviewed:: Yes Do you feel safe going back to the place where you live?: Yes      Need for Family Participation in Patient Care: Yes (Comment) Care giver support  system in place?: Yes (comment)   Criminal Activity/Legal Involvement Pertinent to Current Situation/Hospitalization: No - Comment as needed  Activities of Daily Living   ADL Screening (condition at time of admission) Independently performs ADLs?: Yes (appropriate for developmental age) Is the patient deaf or have difficulty hearing?: No Does the patient have difficulty seeing, even when wearing glasses/contacts?: No Does the patient have difficulty concentrating, remembering, or making decisions?: No  Permission Sought/Granted Permission sought to share information with : Case Manager Permission granted to share information with : Yes, Verbal Permission Granted              Emotional Assessment Appearance:: Appears stated age Attitude/Demeanor/Rapport: Engaged Affect (typically observed): Appropriate Orientation: : Oriented to Self, Oriented to Place, Oriented to  Time, Oriented to Situation Alcohol / Substance Use: Not Applicable Psych Involvement: No (comment)  Admission diagnosis:  Chest pain [R07.9] Symptomatic anemia [D64.9] Chest pain due to myocardial ischemia, unspecified ischemic chest pain type [I25.9] Patient Active Problem List   Diagnosis Date Noted   Chest pain 10/23/2023   History of essential hypertension 10/23/2023   GERD (gastroesophageal reflux disease) 10/23/2023   NSTEMI (non-ST elevated myocardial infarction) (HCC) 10/11/2023   Aortic valve disease 10/11/2023   Coronary artery disease involving native coronary artery of native heart with unstable angina pectoris (HCC) 10/10/2023   Chronic blood loss anemia 10/10/2023   Gross hematuria 10/07/2023  BPH (benign prostatic hyperplasia) 10/04/2023   Acute on chronic diastolic CHF (congestive heart failure) (HCC) 10/04/2023   Acute on chronic blood loss anemia 10/03/2023   Kidney lesion, native, left 10/03/2023   Acute respiratory failure with hypoxia (HCC) 10/03/2023   Lesion of right native kidney  10/03/2023   Elevated brain natriuretic peptide (BNP) level 10/03/2023   Coronary artery disease 10/03/2023   SOB (shortness of breath) 10/03/2023   Bladder outlet obstruction 10/03/2023   Chronic indwelling Foley catheter 10/03/2023   Hematuria 10/03/2023   Enlarged prostate 10/03/2023   Moderate aortic regurgitation 10/03/2023   Hyperlipidemia 05/20/2017   Nicotine dependence, cigarettes, uncomplicated 05/20/2017   Carotid bruit 05/19/2017   Essential hypertension    RBBB 04/13/2017   Severe aortic stenosis 04/13/2017   PCP:  Mila Palmer, MD Pharmacy:   [facility] [phone] - [place], [place] - [address] AT SEC [address] [place] [address] Phone: [phone] Fax: [phone]  [facility] [address] [place] [address] Phone: [phone] Fax: [phone]     Social Determinants of Health (SDOH) Social History: SDOH Screenings   Food Insecurity: Food Insecurity Present (10/23/2023)  Housing: Low Risk  (10/23/2023)  Transportation Needs: Unknown (10/23/2023)  Utilities: Not At Risk (10/23/2023)  Tobacco Use: High Risk (10/23/2023)   SDOH Interventions:     Readmission Risk Interventions    10/25/2023    3:42 PM  Readmission Risk Prevention Plan  Transportation Screening Complete  PCP or Specialist Appt within 3-5 Days Complete  HRI or Home Care Consult Complete  Palliative Care Screening Not Applicable  Medication Review (RN Care Manager) Complete

## 2023-10-25 NOTE — Progress Notes (Signed)
PROGRESS NOTE    Kyle Matthews  ZOX:[id number] DOB: February 15, 1946 DOA: 10/23/2023 PCP: Mila Palmer, MD     Brief Narrative:    Kyle Matthews is a 77 y.o. male with medical history significant for severe aortic stenosis, BPH with recurrent gross hematuria, chronic blood loss anemia associated baseline hemoglobin 7.5-10, who is admitted to [facility] on 10/23/2023 with NSTEMI after presenting from home to [facility] ED complaining of chest pain.  He was admitted to the hospital about 2 weeks ago for chest pain and hematuria.  Eventual plan was to challenge him with DAPT for couple of weeks and if tolerates, proceed with TAVR/PCI.  Seen by urology but now developed hematuria again.     Assessment & Plan:   Principal Problem:   NSTEMI (non-ST elevated myocardial infarction) (HCC) Active Problems:   Acute on chronic blood loss anemia   Severe aortic stenosis   BPH (benign prostatic hyperplasia)   Hyperlipidemia   Chest pain   History of essential hypertension   GERD (gastroesophageal reflux disease)   Acute Blood Loss Anemia secondary to hematuria BPH with chronic indwelling Foley catheter Unfortunately patient has been having recurrent hematuria.  Urology planning ADT for now.  Status post multiple units of PRBC transfusion.  Will give additional unit today.  Hemoglobin continues to drop.  Urology consulted, IR for possible prostate artery embolization today.   Severe aortic stenosis Acute on chronic congestive heart failure with preserved EF CAD Recently patient was placed on DAPT challenge but did not tolerate therefore discontinued due to hematuria.  Considering near future CABG/TAVR evaluation.  Cardiology team is following.  On Lasix.  Acute Hypoxic Respiratory Failure Shortness of Breath Chest x-ray showing signs of some volume overload.  Currently on Lasix IV twice daily.   Acute Kidney Injury Resolved.  Peaked at 1.3   Hypertension IV as needed   GERD PPI       DVT  prophylaxis: SCD Code Status: full Family Communication: wife at bedside Disposition:    Status is: Inpatient Remains inpatient appropriate because: continued need for inpatient care   Consultants:  Urology cardiology   Procedures:  none      Subjective: Still having persistent hematuria  Physical exam: Constitutional: Not in acute distress Respiratory: Clear to auscultation bilaterally Cardiovascular: Normal sinus rhythm, no rubs Abdomen: Nontender nondistended good bowel sounds Musculoskeletal: No edema noted Skin: No rashes seen Neurologic: CN 2-12 grossly intact.  And nonfocal Psychiatric: Normal judgment and insight. Alert and oriented x 3. Normal mood. Foley bag-persistent hematuria noted          Diet Orders (From admission, onward)     Start     Ordered   10/23/23 0358  Diet regular Room service appropriate? Yes; Fluid consistency: Thin  Diet effective now       Question Answer Comment  Room service appropriate? Yes   Fluid consistency: Thin      10/23/23 0357            Objective: Vitals:   10/25/23 1211 10/25/23 1212 10/25/23 1234 10/25/23 1235  BP:  (!) 90/51 (!) 88/49 (!) 94/45  Pulse: 68  73 69  Resp:  20 18   Temp:  98.5 F (36.9 C) 98.2 F (36.8 C)   TempSrc:  Axillary Axillary   SpO2: 97% 97% 97% 97%  Weight:      Height:        Intake/Output Summary (Last 24 hours) at 10/25/2023 1239 Last data filed at 10/25/2023  1231 Gross per 24 hour  Intake 4042 ml  Output 8050 ml  Net -4008 ml   Filed Weights   10/23/23 0122 10/23/23 1559 10/25/23 0728  Weight: 66.7 kg 69.1 kg 70.5 kg    Scheduled Meds:  sodium chloride   Intravenous Once   Chlorhexidine Gluconate Cloth  6 each Topical Daily   finasteride  5 mg Oral QPM   furosemide  40 mg Intravenous BID   pantoprazole  40 mg Oral Daily   rosuvastatin  40 mg Oral Daily   Continuous Infusions:  Nutritional status     Body mass index is 27.53 kg/m.  Data Reviewed:    CBC: Recent Labs  Lab 10/23/23 0129 10/23/23 0131 10/23/23 1700 10/24/23 0556 10/25/23 0401 10/25/23 0755  WBC 10.9*  --  10.0 10.8* 8.1  --   NEUTROABS 8.8*  --  7.3 8.2* 6.3  --   HGB 5.2* 5.4* 8.4* 8.7* 7.2* 7.3*  HCT 17.4* 16.0* 26.0* 26.7* 22.5* 23.2*  MCV 93.5  --  90.9 91.1 88.9  --   PLT 211  --  163 177 164  --    Basic Metabolic Panel: Recent Labs  Lab 10/23/23 0129 10/23/23 0131 10/23/23 0740 10/23/23 0800 10/24/23 0556 10/25/23 0401  NA 137 137  --  136 135 135  K 4.2 4.2  --  4.1 4.5 3.6  CL 106 103  --  105 102 103  CO2 24  --   --  21* 25 26  GLUCOSE 124* 118*  --  106* 110* 103*  BUN 21 26*  --  18 27* 29*  CREATININE 0.94 1.00  --  0.90 1.36* 1.03  CALCIUM 8.2*  --   --  8.4* 8.3* 7.8*  MG  --   --  1.7 1.7 1.9 2.2  PHOS  --   --   --   --  5.7* 4.3   GFR: Estimated Creatinine Clearance: 54.6 mL/min (by C-G formula based on SCr of 1.03 mg/dL). Liver Function Tests: Recent Labs  Lab 10/23/23 0129 10/23/23 0800 10/24/23 0556 10/25/23 0401  AST 27 19 20  12*  ALT 18 18 17 13   ALKPHOS 37* 37* 40 39  BILITOT 0.4 1.2* 0.7 0.7  PROT 4.9* 5.1* 5.4* 4.6*  ALBUMIN 2.6* 2.7* 2.6* 2.1*   No results for input(s): "LIPASE", "AMYLASE" in the last 168 hours. No results for input(s): "AMMONIA" in the last 168 hours. Coagulation Profile: Recent Labs  Lab 10/23/23 0129  INR 1.3*   Cardiac Enzymes: No results for input(s): "CKTOTAL", "CKMB", "CKMBINDEX", "TROPONINI" in the last 168 hours. BNP (last 3 results) No results for input(s): "PROBNP" in the last 8760 hours. HbA1C: Recent Labs    10/23/23 0129  HGBA1C 4.7*   CBG: No results for input(s): "GLUCAP" in the last 168 hours. Lipid Profile: Recent Labs    10/23/23 0129  CHOL 122  HDL 52  LDLCALC 55  TRIG 74  CHOLHDL 2.3   Thyroid Function Tests: No results for input(s): "TSH", "T4TOTAL", "FREET4", "T3FREE", "THYROIDAB" in the last 72 hours. Anemia Panel: Recent Labs     10/23/23 0740  FERRITIN 33  TIBC 461*  IRON 137   Sepsis Labs: Recent Labs  Lab 10/23/23 0132  LATICACIDVEN 1.3    No results found for this or any previous visit (from the past 240 hour(s)).       Radiology Studies: DG CHEST PORT 1 VIEW  Result Date: 10/24/2023 CLINICAL DATA:  Shortness of breath.  History of mitral valve replacement EXAM: PORTABLE CHEST 1 VIEW COMPARISON:  October 23, 2023, October 10, 2023, October 05, 2023 FINDINGS: The cardiomediastinal silhouette is unchanged and enlarged in contour.Atherosclerotic calcifications of the tortuous thoracic aorta. Trace bilateral pleural effusions. No pneumothorax. Diffuse interstitial prominence, similar in comparison to priors. Increased airspace opacity along the RIGHT inferior perihilar border. Visualized abdomen is unremarkable. IMPRESSION: 1. Increased airspace opacity along the RIGHT inferior perihilar border. This could reflect atelectasis versus infection versus confluent edema. 2. Trace bilateral pleural effusions with a background of otherwise relatively similar pulmonary edema. Electronically Signed   By: Meda Klinefelter M.D.   On: 10/24/2023 07:36           LOS: 2 days   Time spent= 35 mins    Miguel Rota, MD [facility]  If 7PM-7AM, please contact night-coverage  10/25/2023, 12:39 PM

## 2023-10-25 NOTE — Progress Notes (Signed)
Called by nursing, patient with grossly hematuria.  Bladder irrigated, did not clear.  Clots noted.  Currently unable to irrigate further, patient with pressure but no pain.  Bladder scan approximately 265 cc urine remains. Stat H&H ordered Urology on board, signed off yesterday.  Will reconsult as patient's body likely clotted off.  CBI needed but given patient's severe prostate enlargement, deferring foley change to urology Urology had recommended prostate artery embolization by IR

## 2023-10-25 NOTE — Plan of Care (Signed)
  Problem: Education: Goal: Knowledge of General Education information will improve Description: Including pain rating scale, medication(s)/side effects and non-pharmacologic comfort measures Outcome: Progressing   Problem: Clinical Measurements: Goal: Ability to maintain clinical measurements within normal limits will improve Outcome: Progressing   Problem: Clinical Measurements: Goal: Will remain free from infection Outcome: Progressing   Problem: Clinical Measurements: Goal: Respiratory complications will improve Outcome: Progressing   Problem: Clinical Measurements: Goal: Cardiovascular complication will be avoided Outcome: Progressing   Problem: Activity: Goal: Risk for activity intolerance will decrease Outcome: Progressing   Problem: Pain Management: Goal: General experience of comfort will improve Outcome: Progressing

## 2023-10-25 NOTE — Hospital Course (Addendum)
77 y.o. male with medical history significant for severe aortic stenosis, BPH with recurrent gross hematuria, chronic blood loss anemia associated baseline hemoglobin 7.5-10, who is admitted to [facility] on 10/23/2023 with NSTEMI after presenting from home to [facility] ED complaining of chest pain.  He was admitted to the hospital about 2 weeks ago for chest pain and hematuria.  Eventual plan was to challenge him with DAPT for couple of weeks and if tolerates, proceed with TAVR/PCI.Seen by urology but now developed hematuria again-status post multiple PRBC transfusion. S/p  IR  prostate artery embolization 11/8.  Cardiology following Cont PPI

## 2023-10-25 NOTE — Progress Notes (Signed)
Notified Dr. Nelson Chimes of Pts. BP: 85/52. Per provider, hold Lasix this evening and continue to monitor.

## 2023-10-25 NOTE — Progress Notes (Signed)
Rounding Note    Patient Name: Kyle Matthews Date of Encounter: 10/25/2023  [facility] Cardiologist: Maisie Fus, MD   Subjective  Interval history: [name] presented with hematuria and hemoglobin of 5.4.  He also had diffuse ST elevation on his EKG and mild aVR elevation.  He underwent blood transfusions.  He was seen by urology that would recommend prostatectomy however he is high risk for surgery.  He is currently on androgen deprivation therapy to help with his hematuria prior to his CABG/AVR. On 11/5-His Lasix was increased.  Overnight he had gross hematuria.  Therefore urology was reinvolved.  They recommended prostate artery embolization by IR.  His kidney function normal Troponin 482-> 1234 Hemoglobin 7, stable   Inpatient Medications    Scheduled Meds:  sodium chloride   Intravenous Once   sodium chloride   Intravenous Once   Chlorhexidine Gluconate Cloth  6 each Topical Daily   finasteride  5 mg Oral QPM   furosemide  40 mg Intravenous BID   pantoprazole  40 mg Oral Daily   rosuvastatin  40 mg Oral Daily   Continuous Infusions:   PRN Meds: acetaminophen **OR** acetaminophen, hydrALAZINE, ipratropium-albuterol, melatonin, metoprolol tartrate, morphine injection, ondansetron (ZOFRAN) IV, oxyCODONE, senna-docusate   Vital Signs    Vitals:   10/25/23 0104 10/25/23 0529 10/25/23 0724 10/25/23 0728  BP: (!) 90/56 (!) 92/53 (!) 88/60   Pulse: 76 69    Resp: 18 18    Temp: 98.6 F (37 C) 98.7 F (37.1 C) 98.4 F (36.9 C)   TempSrc: Oral Oral Oral   SpO2: 98% 97%    Weight:    70.5 kg  Height:        Intake/Output Summary (Last 24 hours) at 10/25/2023 0953 Last data filed at 10/25/2023 0724 Gross per 24 hour  Intake 677 ml  Output 1950 ml  Net -1273 ml      10/25/2023    7:28 AM 10/23/2023    3:59 PM 10/23/2023    1:22 AM  Last 3 Weights  Weight (lbs) 155 lb 6.8 oz 152 lb 5.4 oz 147 lb  Weight (kg) 70.5 kg 69.1 kg 66.679 kg       Telemetry    Sinus rhythm - Personally Reviewed  ECG  No new  Initial Sinus rhythm with diffuse ST depression and mild aVR elevation- Personally Reviewed  Physical Exam   Vitals:   10/25/23 0529 10/25/23 0724  BP: (!) 92/53 (!) 88/60  Pulse: 69   Resp: 18   Temp: 98.7 F (37.1 C) 98.4 F (36.9 C)  SpO2: 97%     GEN: No acute distress.  Deconditioned Neck: + JVD Cardiac: RRR, SEM RUSB that radiates to the apex Respiratory: nl wob, decreased breath sounds in the bases GI:  non-distended  MS: Bilateral trace lower extremity edema Neuro:  Nonfocal  Psych: Normal affect   Labs    High Sensitivity Troponin:   Recent Labs  Lab 10/03/23 1521 10/04/23 0505 10/23/23 0129 10/23/23 0740 10/23/23 1700  TROPONINIHS 4,814* 3,357* 369* 482* 1,234*     Chemistry Recent Labs  Lab 10/23/23 0800 10/24/23 0556 10/25/23 0401  NA 136 135 135  K 4.1 4.5 3.6  CL 105 102 103  CO2 21* 25 26  GLUCOSE 106* 110* 103*  BUN 18 27* 29*  CREATININE 0.90 1.36* 1.03  CALCIUM 8.4* 8.3* 7.8*  MG 1.7 1.9 2.2  PROT 5.1* 5.4* 4.6*  ALBUMIN 2.7* 2.6* 2.1*  AST 19 20 12*  ALT 18 17 13   ALKPHOS 37* 40 39  BILITOT 1.2* 0.7 0.7  GFRNONAA >60 54* >60  ANIONGAP 10 8 6     Lipids  Recent Labs  Lab 10/23/23 0129  CHOL 122  TRIG 74  HDL 52  LDLCALC 55  CHOLHDL 2.3    Hematology Recent Labs  Lab 10/23/23 1700 10/24/23 0556 10/25/23 0401 10/25/23 0755  WBC 10.0 10.8* 8.1  --   RBC 2.86* 2.93* 2.53*  --   HGB 8.4* 8.7* 7.2* 7.3*  HCT 26.0* 26.7* 22.5* 23.2*  MCV 90.9 91.1 88.9  --   MCH 29.4 29.7 28.5  --   MCHC 32.3 32.6 32.0  --   RDW 15.9* 16.0* 15.7*  --   PLT 163 177 164  --    Thyroid No results for input(s): "TSH", "FREET4" in the last 168 hours.  BNPNo results for input(s): "BNP", "PROBNP" in the last 168 hours.  DDimer No results for input(s): "DDIMER" in the last 168 hours.   Radiology    DG CHEST PORT 1 VIEW  Result Date: 10/24/2023 CLINICAL DATA:   Shortness of breath. History of mitral valve replacement EXAM: PORTABLE CHEST 1 VIEW COMPARISON:  October 23, 2023, October 10, 2023, October 05, 2023 FINDINGS: The cardiomediastinal silhouette is unchanged and enlarged in contour.Atherosclerotic calcifications of the tortuous thoracic aorta. Trace bilateral pleural effusions. No pneumothorax. Diffuse interstitial prominence, similar in comparison to priors. Increased airspace opacity along the RIGHT inferior perihilar border. Visualized abdomen is unremarkable. IMPRESSION: 1. Increased airspace opacity along the RIGHT inferior perihilar border. This could reflect atelectasis versus infection versus confluent edema. 2. Trace bilateral pleural effusions with a background of otherwise relatively similar pulmonary edema. Electronically Signed   By: Meda Klinefelter M.D.   On: 10/24/2023 07:36    Cardiac Studies   TTE 10/03/2023 1. Left ventricular ejection fraction, by estimation, is 55 to 60%. The  left ventricle has normal function. The left ventricle has no regional  wall motion abnormalities. There is mild left ventricular hypertrophy.  Left ventricular diastolic parameters  are indeterminate. Elevated left atrial pressure.   2. Right ventricular systolic function is normal. The right ventricular size is normal. There is mildly elevated pulmonary artery systolic pressure.   3. Left atrial size was moderately dilated.   4. Right atrial size was moderately dilated.   5. The mitral valve is abnormal. Mild to moderate mitral valve regurgitation. No evidence of mitral stenosis.   6. The tricuspid valve is abnormal.   7. The aortic valve was not well visualized. There is severe calcifcation of the aortic valve. There is severe thickening of the aortic valve.  Aortic valve regurgitation is moderate to severe. Severe aortic valve stenosis. Severe aortic stenosis is  present. Aortic valve mean gradient measures 42.0 mmHg. Aortic valve peak gradient  measures 69.2 mmHg. Aortic valve area, by VTI measures 0.89 cm.   8. The inferior vena cava is dilated in size with >50% respiratory variability, suggesting right atrial pressure of 8 mmHg.    LHC 10/09/2023   Ost LM lesion is 50% stenosed.   1.  Moderate left main stenosis with an RFR of 0.83. 2.  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 3.  Fick cardiac output of 6.5 L/min and Fick cardiac index of 3.8 L/min/m with the following hemodynamics:             Right atrial pressure mean of 7  Right ventricular pressure 42/-2 with an end-diastolic pressure of 8 mmHg             Wedge pressure mean of 14 mmHg with V waves to 23 mmHg             PA pressure of 38/16 with a mean of 24 mmHg             PVR of 1.5 Woods units             PA pulsatility index of 3.1   Recommendation: Continue evaluation for aortic valve intervention.  Will obtain TAVR protocol CTA tomorrow.  Patient Profile     77 y.o. 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   Assessment & Plan    Bladder outlet obstruction Hematuria -Recommend hemoglobin greater than 8 -Continue with urology workup and possible IR embolization  CAD- 50% LM CP- demand ischemia -His EKG shows diffuse ST depression and some mild elevation in aVR.  He has known 50% left main disease.  He is pending a CABG on the 14th.  CT surgery is aware that he is here and the plan is to continue to wait for his scheduled date. -Antiplatelets are on hold secondary to hematuria, as well cannot do anticoagulation -Continue Crestor 40 mg daily -He is hypotensive holding off on beta-blocker  HFpEF -Continue Lasix to 40 IV twice BID  Severe AS -He is pending a TAVR  Overall, he has multiple comorbidities in the setting of hematuria and anemia.  He is  tenuous, he will stay until his procedure and receive medical management;  (CABG/TAVR) 11/14.   Time Spent Directly with Patient:  I have spent a total of 35 minutes with the patient reviewing hospital notes, telemetry, EKGs, labs and examining the patient as well as establishing an assessment and plan that was discussed personally with the patient.  > 50% of time was spent in direct patient care.      For questions or updates, please contact [facility] Please consult [email or web address] for contact info under        Signed, Maisie Fus, MD  10/25/2023, 9:53 AM

## 2023-10-26 DIAGNOSIS — I214 Non-ST elevation (NSTEMI) myocardial infarction: Secondary | ICD-10-CM | POA: Diagnosis not present

## 2023-10-26 DIAGNOSIS — I5030 Unspecified diastolic (congestive) heart failure: Secondary | ICD-10-CM | POA: Diagnosis not present

## 2023-10-26 DIAGNOSIS — I2510 Atherosclerotic heart disease of native coronary artery without angina pectoris: Secondary | ICD-10-CM | POA: Diagnosis not present

## 2023-10-26 LAB — CBC
HCT: 24.1 % — ABNORMAL LOW (ref 39.0–52.0)
Hemoglobin: 7.8 g/dL — ABNORMAL LOW (ref 13.0–17.0)
MCH: 28.7 pg (ref 26.0–34.0)
MCHC: 32.4 g/dL (ref 30.0–36.0)
MCV: 88.6 fL (ref 80.0–100.0)
Platelets: 181 10*3/uL (ref 150–400)
RBC: 2.72 MIL/uL — ABNORMAL LOW (ref 4.22–5.81)
RDW: 15.3 % (ref 11.5–15.5)
WBC: 6.8 10*3/uL (ref 4.0–10.5)
nRBC: 0 % (ref 0.0–0.2)

## 2023-10-26 LAB — BASIC METABOLIC PANEL
Anion gap: 6 (ref 5–15)
BUN: 28 mg/dL — ABNORMAL HIGH (ref 8–23)
CO2: 27 mmol/L (ref 22–32)
Calcium: 7.6 mg/dL — ABNORMAL LOW (ref 8.9–10.3)
Chloride: 104 mmol/L (ref 98–111)
Creatinine, Ser: 0.98 mg/dL (ref 0.61–1.24)
GFR, Estimated: >60 mL/min (ref >60)
Glucose, Bld: 98 mg/dL (ref 70–99)
Potassium: 4 mmol/L (ref 3.5–5.1)
Sodium: 137 mmol/L (ref 135–145)

## 2023-10-26 LAB — TYPE AND SCREEN
ABO/RH(D): AB POS
Antibody Screen: NEGATIVE
Unit division: 0
Unit division: 0
Unit division: 0
Unit division: 0

## 2023-10-26 LAB — BPAM RBC
Blood Product Expiration Date: 202411042359
Blood Product Expiration Date: 202411212359
Blood Product Expiration Date: 202411212359
Blood Product Expiration Date: 202411212359
ISSUE DATE / TIME: 202411040834
ISSUE DATE / TIME: 202411061205
ISSUE DATE / TIME: 202411040248
ISSUE DATE / TIME: 202411040552
Unit Type and Rh: 6200
Unit Type and Rh: 8400
Unit Type and Rh: 6200
Unit Type and Rh: 6200

## 2023-10-26 LAB — MAGNESIUM: Magnesium: 2.2 mg/dL (ref 1.7–2.4)

## 2023-10-26 MED ORDER — CEFTRIAXONE SODIUM 2 G IJ SOLR
2.0000 g | INTRAMUSCULAR | Status: AC
  Administered 2023-10-27: 2 g via INTRAVENOUS

## 2023-10-26 MED ORDER — HYOSCYAMINE SULFATE 0.125 MG PO TBDP: 0.1250 mg | ORAL_TABLET | ORAL | Status: DC | PRN

## 2023-10-26 MED ORDER — HYOSCYAMINE SULFATE 0.125 MG SL SUBL
0.1250 mg | SUBLINGUAL_TABLET | SUBLINGUAL | Status: DC | PRN
  Administered 2023-10-26 – 2023-10-30 (×9): 0.125 mg via SUBLINGUAL
  Filled 2023-10-26 (×11): qty 1

## 2023-10-26 MED ORDER — FUROSEMIDE 10 MG/ML IJ SOLN
20.0000 mg | Freq: Every day | INTRAMUSCULAR | Status: DC
Start: 2023-10-27 — End: 2023-11-02
  Administered 2023-10-27 – 2023-11-01 (×6): 20 mg via INTRAVENOUS
  Filled 2023-10-26 (×6): qty 2

## 2023-10-26 NOTE — Consult Note (Signed)
Chief Complaint: Patient was seen in consultation today for Prostate Artery Embolization Chief Complaint  Patient presents with   Chest Pain   at the request of Dr Berneice Heinrich   Supervising Physician: Gilmer Mor  Patient Status: [facility] - In-pt  History of Present Illness: Kyle Matthews is a 77 y.o. male   FULL Code status per pt Hx AO stenosis; BPH - recurrent gross hematuria Blood loss anemia Admitted from Ed with NSTEMI 11/4  Follows with Urology #ABLA #Hematuria    Loading dose of Firmagon provided yesterday.  Will continue ADT through prostate surgery in an effort to reduce prostate size, vascularity, and bleeding.   Ongoing bleeding requiring additional transfusion today. Foley catheter for three-way 70f hematuria coud.  Hand irrigated and initiated CBI.  Nursing to hand irrigate as necessary Discussed w/ Dr. Berneice Heinrich. Prostate artery embolization requested today.  Dr Loreta Ave has reviewed imaging and chart He has discussed with Urology team Approves Prostate artery embolization---planned for 11/8 in IR    Past Medical History:  Diagnosis Date   BPH (benign prostatic hyperplasia)    Status post biopsy in 2009. 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 GR 1 DD. No regional wall motion abnormality. Moderate aortic stenosis with moderate regurgitation. Mean gradient 34 mmHg.    Allergies: Tetanus toxoids  Medications: Prior to Admission medications   Medication Sig Start Date End Date Taking? Authorizing Provider  amLODipine (NORVASC) 5 MG tablet Take 1 tablet (5 mg total) by mouth daily. 10/13/23  Yes Amin, Ankit C, MD  aspirin EC 81 MG tablet Take 1 tablet (81 mg total) by mouth daily. Swallow whole. 10/12/23 11/11/23 Yes Amin, Ankit C, MD  clopidogrel (PLAVIX) 75 MG tablet Take 1 tablet (75 mg total) by mouth daily. 10/12/23 11/11/23 Yes Amin, Ankit C, MD  finasteride (PROSCAR) 5 MG tablet Take 5 mg by mouth every evening.   Yes [provider]  furosemide (LASIX) 20 MG tablet Take 1 tablet (20 mg total) by mouth daily as needed. 10/18/23  Yes [name], MD  nitroGLYCERIN (NITROSTAT) 0.4 MG SL tablet Place 1 tablet (0.4 mg total) under the tongue every 5 (five) minutes as needed for chest pain. 10/19/23  Yes [name], MD  ondansetron (ZOFRAN-ODT) 4 MG disintegrating tablet Take 1 tablet (4 mg total) by mouth every 8 (eight) hours as needed for nausea or vomiting. 10/15/23  Yes Jeannie Fend, PA-C  pantoprazole (PROTONIX) 40 MG tablet Take 1 tablet (40 mg total) by mouth daily. 10/19/23  Yes [name], MD  rosuvastatin (CRESTOR) 40 MG tablet Take 40 mg by mouth daily.   Yes [provider]     Family History  Problem  Relation Age of Onset   Coronary artery disease Mother 63       20. 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 Lives with his wife of 49 years. He is a retired Marine scientist used to work for [employer]   4 cups of coffee a day.    No structured exercise regimen   Social Determinants of Health   Financial Resource Strain: Not on file  Food Insecurity: Food Insecurity Present (10/23/2023)   Hunger Vital Sign    Worried About Running Out of Food in the Last Year: Sometimes true    Ran Out of Food in the Last Year: Sometimes true  Transportation Needs: Unknown (10/23/2023)   PRAPARE - Administrator, Civil Service (Medical): Patient unable to answer    Lack of Transportation (Non-Medical): No  Physical Activity: Not on file  Stress: Not on file  Social Connections: Not on file    Review of Systems: A 12 point ROS discussed and pertinent positives are indicated in the HPI above.  All other systems are negative.  Review of Systems  Constitutional:  Positive for activity change. Negative for fatigue and fever.  Respiratory:  Negative for cough and shortness of breath.   Cardiovascular:  Negative for chest pain.  Gastrointestinal:  Negative for abdominal pain.  Genitourinary:  Positive for hematuria.  Musculoskeletal:  Negative for back pain.  Psychiatric/Behavioral:  Negative for behavioral problems and confusion.     Vital  Signs: BP (!) 105/58 (BP Location: Left Arm)   Pulse 87   Temp 98.5 F (36.9 C) (Oral)   Resp 18   Ht 5\' 3"  (1.6 m)   Wt 155 lb 10.3 oz (70.6 kg)   SpO2 92%   BMI 27.57 kg/m   Advance Care Plan: The advanced care plan/surrogate decision maker was discussed at the time of visit and documented in the medical record.    Physical Exam Vitals reviewed.  Cardiovascular:     Rate and Rhythm: Normal rate and regular rhythm.  Pulmonary:     Effort: Pulmonary effort is normal.     Breath sounds: No wheezing.  Musculoskeletal:        General: Normal range of motion.  Skin:    General: Skin is  warm.  Neurological:     Mental Status: He is alert and oriented to person, place, and time.  Psychiatric:        Behavior: Behavior normal.     Imaging: DG CHEST PORT 1 VIEW  Result Date: 10/24/2023 CLINICAL DATA:  Shortness of breath. History of mitral valve replacement EXAM: PORTABLE CHEST 1 VIEW COMPARISON:  October 23, 2023, October 10, 2023, October 05, 2023 FINDINGS: The cardiomediastinal silhouette is unchanged and enlarged in contour.Atherosclerotic calcifications of the tortuous thoracic aorta. Trace bilateral pleural effusions. No pneumothorax. Diffuse interstitial prominence, similar in comparison to priors. Increased airspace opacity along the RIGHT inferior perihilar border. Visualized abdomen is unremarkable. IMPRESSION: 1. Increased airspace opacity along the RIGHT inferior perihilar border. This could reflect atelectasis versus infection versus confluent edema. 2. Trace bilateral pleural effusions with a background of otherwise relatively similar pulmonary edema. Electronically Signed   By: Meda Klinefelter M.D.   On: 10/24/2023 07:36   DG Chest Port 1 View  Result Date: 10/23/2023 CLINICAL DATA:  Chest pain EXAM: PORTABLE CHEST 1 VIEW COMPARISON:  10/05/2023 FINDINGS: The lungs are symmetrically well expanded. There is progressive central pulmonary vascular congestion without  superimposed overt pulmonary edema. Mild cardiomegaly is stable. No confluent pulmonary infiltrate. No pneumothorax or pleural effusion. No acute bone abnormality. IMPRESSION: 1. Progressive central pulmonary vascular congestion without superimposed overt pulmonary edema. Stable cardiomegaly. Electronically Signed   By: Helyn Numbers M.D.   On: 10/23/2023 01:45   CT CORONARY MORPH W/CTA COR W/SCORE W/CA W/CM &/OR WO/CM  Addendum Date: 10/10/2023   ADDENDUM REPORT: 10/10/2023 16:14 EXAM: OVER-READ INTERPRETATION  CT CHEST The following report is an over-read performed by radiologist Dr. Jacob Moores [facility] on 10/10/2023. This over-read does not include interpretation of cardiac or coronary anatomy or pathology. The cardiac TAVR interpretation by the cardiologist is attached. COMPARISON:  None. FINDINGS: Extracardiac findings will be described separately under dictation for contemporaneously obtained CTA chest, abdomen and pelvis. IMPRESSION: Please see separate dictation for contemporaneously obtained CTA chest, abdomen and pelvis dated 10/10/2023 for full description of relevant extracardiac findings. Electronically Signed   By: Allegra Lai M.D.   On: 10/10/2023 16:14   Result Date: 10/10/2023 CLINICAL DATA:  68M with severe aortic stenosis being evaluated for a TAVR procedure. EXAM: Cardiac TAVR CT TECHNIQUE: The patient was scanned on a [employer]. A 120 kV retrospective scan was triggered in the descending thoracic aorta at 111 HU's. Gantry rotation speed was 250 msecs and collimation was .6 mm. No beta blockade or nitro were given. The 3D data set was reconstructed in 5% intervals of the R-R cycle. Systolic and diastolic phases were analyzed on a dedicated work station using MPR, MIP and VRT modes. The patient received 100 cc of contrast. FINDINGS: Aortic Root: Aortic valve: Trileaflet Aortic valve calcium score: 3834 Aortic annulus: Diameter: 29mm x 21mm  Perimeter: 78mm Area: 454 mm^2 Calcifications: Mild calcification adjacent to LCC Coronary height: Min Left - 16mm; Min Right - 24mm Sinotubular height: Left cusp - 26mm; Right cusp - 28mm; Noncoronary cusp - 25mm LVOT (as measured 3 mm below the annulus): Diameter: 29mm x 21mm Area: 440 mm^2 Calcifications: No calcifications Aortic sinus width: Left cusp - 32mm; Right cusp - 32mm; Noncoronary cusp - 34mm Sinotubular junction width: 29mm x 27mm Optimum Fluoroscopic Angle for Delivery: RAO 1 CAU 11 Cardiac: Right atrium: Mild enlargement Right ventricle: Normal size Pulmonary arteries: Normal size Pulmonary veins: Normal configuration Left atrium: Normal size  Left ventricle: Normal size Pericardium: Normal thickness Coronary arteries: Calcium score 2379 (91st percentile) IMPRESSION: 1. Trileaflet aortic valve with severe calcifications (AV calcium score 3834) 2. Aortic annulus measures 29mm x 21mm in diameter with perimeter 78mm and area 454 mm^2. Mild annular calcifications adjacent to left coronary cusp. Annular measurements are suitable for placement of a 26mm Edwards Sapien 3 valve. 3.  Sufficient coronary to annulus distance. 4.  Optimum Fluoroscopic Angle for Delivery:  RAO 1 CAU 11 5.  Coronary calcium score 2379 (91st percentile) Electronically Signed: By: Epifanio Lesches M.D. On: 10/10/2023 15:38   CT ANGIO CHEST AORTA W/CM & OR WO/CM  Result Date: 10/10/2023 CLINICAL DATA:  Aortic valve replacement preop evaluation. EXAM: CT ANGIOGRAPHY CHEST, ABDOMEN AND PELVIS TECHNIQUE: Non-contrast CT of the chest was initially obtained. Multidetector CT imaging through the chest, abdomen and pelvis was performed using the standard protocol during bolus administration of intravenous contrast. Multiplanar reconstructed images and MIPs were obtained and reviewed to evaluate the vascular anatomy. RADIATION DOSE REDUCTION: This exam was performed according to the departmental dose-optimization program which  includes automated exposure control, adjustment of the mA and/or kV according to patient size and/or use of iterative reconstruction technique. CONTRAST:  95mL OMNIPAQUE IOHEXOL 350 MG/ML SOLN COMPARISON:  Chest CTA dated October 03, 2023; CT abdomen and pelvis dated October 03, 2023 FINDINGS: CTA CHEST FINDINGS Cardiovascular: Normal heart size. No pericardial effusion. Aortic valve thickening and calcifications. Normal caliber thoracic aorta with severe atherosclerotic disease. Severe left main and three-vessel coronary artery calcifications. Mitral annular calcifications. Mediastinum/Nodes: Esophagus and thyroid are unremarkable. No enlarged lymph nodes seen in the chest. Calcified mediastinal and right hilar lymph nodes which are likely sequela of prior granulomatous infection. Lungs/Pleura: Central airways are patent. Mild central predominant ground-glass opacities. Small bilateral pleural effusions with adjacent atelectasis. Calcified right upper lobe nodule. Musculoskeletal: No chest wall abnormality. No acute or significant osseous findings. CTA ABDOMEN AND PELVIS FINDINGS Hepatobiliary: Unchanged simple appearing cyst of the right hepatic lobe. No suspicious liver lesions. Gallbladder is completely filled with hyperdense material, likely combination of sludge and stones. No biliary ductal dilation. Pancreas: Cystic lesion of the uncinate process of the pancreas measuring 12 mm on series 9, image 79, unchanged. No pancreatic ductal dilatation or surrounding inflammatory changes. Spleen: Normal in size without focal abnormality. Adrenals/Urinary Tract: Bilateral adrenal glands are unremarkable. No hydronephrosis or nephrolithiasis. Unchanged bilateral cystic renal lesions, including large cyst of the upper pole of the right kidney with mural calcification and hyperdense lesion of the interpolar left kidney measuring 18 mm on series 6, image 432. Thick-walled urinary bladder which is decompressed with a Foley  catheter. Stomach/Bowel: Stomach is within normal limits. Appendix appears normal. Severe sigmoid diverticulosis. No evidence of bowel wall thickening, distention, or inflammatory changes. Vascular/lymphatic: Normal caliber abdominal aorta moderate to severe atherosclerotic disease. No enlarged lymph nodes seen in the chest. Reproductive: Prostatomegaly, measuring up to 5.5 cm. Other: No abdominal wall hernia or abnormality. No abdominopelvic ascites. Musculoskeletal: No acute or significant osseous findings. VASCULAR MEASUREMENTS PERTINENT TO TAVR: AORTA: Minimal Aortic Diameter-16.4 mm Severity of Aortic Calcification-severe RIGHT PELVIS: Right Common Iliac Artery - Minimal Diameter-10.1 mm Tortuosity-mild Calcification-moderate Right External Iliac Artery - Minimal Diameter - 9.0 mm Tortuosity-moderate Calcification-none Right Common Femoral Artery - Minimal Diameter - 6.6 mm Tortuosity-mild Calcification-moderate LEFT PELVIS: Left Common Iliac Artery - Minimal Diameter-8.1 mm Tortuosity-mild Calcification-moderate Left External Iliac Artery - Minimal Diameter-9.0 mm Tortuosity-moderate Calcification-none Left Common Femoral Artery - Minimal Diameter-7.2 mm  Tortuosity-mild Calcification-moderate Review of the MIP images confirms the above findings. IMPRESSION: Vascular: 1. Vascular findings and measurements pertinent to potential TAVR procedure, as detailed above. 2. Thickening and calcification of the aortic valve, compatible with reported clinical history of aortic stenosis. 3. Moderate to severe aortoiliac atherosclerosis. Left main and 3 vessel coronary artery disease. Nonvascular: 1. Mild central predominant ground-glass opacities, likely due to pulmonary edema. 2. Small bilateral pleural effusions with adjacent atelectasis. 3. Redemonstration of high attenuation lesion of the interpolar left kidney and exophytic cyst of the upper pole of the right kidney with mural calcification. Per prior report, recommend  contrast-enhanced abdominal MRI for further evaluation. 4. 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 Recommend attention recommended MRI. 5. Thick-walled urinary bladder and prostatomegaly, likely due to chronic outlet obstruction. Electronically Signed   By: Allegra Lai M.D.   On: 10/10/2023 16:11   CT Angio Abd/Pel w/ and/or w/o  Result Date: 10/10/2023 CLINICAL DATA:  Aortic valve replacement preop evaluation. EXAM: CT ANGIOGRAPHY CHEST, ABDOMEN AND PELVIS TECHNIQUE: Non-contrast CT of the chest was initially obtained. Multidetector CT imaging through the chest, abdomen and pelvis was performed using the standard protocol during bolus administration of intravenous contrast. Multiplanar reconstructed images and MIPs were obtained and reviewed to evaluate the vascular anatomy. RADIATION DOSE REDUCTION: This exam was performed according to the departmental dose-optimization program which includes automated exposure control, adjustment of the mA and/or kV according to patient size and/or use of iterative reconstruction technique. CONTRAST:  95mL OMNIPAQUE IOHEXOL 350 MG/ML SOLN COMPARISON:  Chest CTA dated October 03, 2023; CT abdomen and pelvis dated October 03, 2023 FINDINGS: CTA CHEST FINDINGS Cardiovascular: Normal heart size. No pericardial effusion. Aortic valve thickening and calcifications. Normal caliber thoracic aorta with severe atherosclerotic disease. Severe left main and three-vessel coronary artery calcifications. Mitral annular calcifications. Mediastinum/Nodes: Esophagus and thyroid are unremarkable. No enlarged lymph nodes seen in the chest. Calcified mediastinal and right hilar lymph nodes which are likely sequela of prior granulomatous infection. Lungs/Pleura: Central airways are patent. Mild central predominant ground-glass opacities. Small bilateral pleural effusions with adjacent atelectasis. Calcified right upper lobe  nodule. Musculoskeletal: No chest wall abnormality. No acute or significant osseous findings. CTA ABDOMEN AND PELVIS FINDINGS Hepatobiliary: Unchanged simple appearing cyst of the right hepatic lobe. No suspicious liver lesions. Gallbladder is completely filled with hyperdense material, likely combination of sludge and stones. No biliary ductal dilation. Pancreas: Cystic lesion of the uncinate process of the pancreas measuring 12 mm on series 9, image 79, unchanged. No pancreatic ductal dilatation or surrounding inflammatory changes. Spleen: Normal in size without focal abnormality. Adrenals/Urinary Tract: Bilateral adrenal glands are unremarkable. No hydronephrosis or nephrolithiasis. Unchanged bilateral cystic renal lesions, including large cyst of the upper pole of the right kidney with mural calcification and hyperdense lesion of the interpolar left kidney measuring 18 mm on series 6, image 432. Thick-walled urinary bladder which is decompressed with a Foley catheter. Stomach/Bowel: Stomach is within normal limits. Appendix appears normal. Severe sigmoid diverticulosis. No evidence of bowel wall thickening, distention, or inflammatory changes. Vascular/lymphatic: Normal caliber abdominal aorta moderate to severe atherosclerotic disease. No enlarged lymph nodes seen in the chest. Reproductive: Prostatomegaly, measuring up to 5.5 cm. Other: No abdominal wall hernia or abnormality. No abdominopelvic ascites. Musculoskeletal: No acute or significant osseous findings. VASCULAR MEASUREMENTS PERTINENT TO TAVR: AORTA: Minimal Aortic Diameter-16.4 mm Severity of Aortic Calcification-severe RIGHT PELVIS: Right Common Iliac Artery - Minimal  Diameter-10.1 mm Tortuosity-mild Calcification-moderate Right External Iliac Artery - Minimal Diameter - 9.0 mm Tortuosity-moderate Calcification-none Right Common Femoral Artery - Minimal Diameter - 6.6 mm Tortuosity-mild Calcification-moderate LEFT PELVIS: Left Common Iliac Artery -  Minimal Diameter-8.1 mm Tortuosity-mild Calcification-moderate Left External Iliac Artery - Minimal Diameter-9.0 mm Tortuosity-moderate Calcification-none Left Common Femoral Artery - Minimal Diameter-7.2 mm Tortuosity-mild Calcification-moderate Review of the MIP images confirms the above findings. IMPRESSION: Vascular: 1. Vascular findings and measurements pertinent to potential TAVR procedure, as detailed above. 2. Thickening and calcification of the aortic valve, compatible with reported clinical history of aortic stenosis. 3. Moderate to severe aortoiliac atherosclerosis. Left main and 3 vessel coronary artery disease. Nonvascular: 1. Mild central predominant ground-glass opacities, likely due to pulmonary edema. 2. Small bilateral pleural effusions with adjacent atelectasis. 3. Redemonstration of high attenuation lesion of the interpolar left kidney and exophytic cyst of the upper pole of the right kidney with mural calcification. Per prior report, recommend contrast-enhanced abdominal MRI for further evaluation. 4. 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 Recommend attention recommended MRI. 5. Thick-walled urinary bladder and prostatomegaly, likely due to chronic outlet obstruction. Electronically Signed   By: Allegra Lai M.D.   On: 10/10/2023 16:11   CARDIAC CATHETERIZATION  Result Date: 10/09/2023   Ost LM lesion is 50% stenosed. 1.  Moderate left main stenosis with an RFR of 0.83. 2.  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 3.  Fick cardiac output of 6.5 L/min and Fick cardiac index of 3.8 L/min/m with the following hemodynamics:  Right atrial pressure mean of 7  Right ventricular pressure 42/-2 with an end-diastolic pressure of 8 mmHg  Wedge pressure mean of 14 mmHg with V waves to 23 mmHg  PA pressure of 38/16 with a mean of 24 mmHg  PVR of 1.5 Woods units  PA pulsatility index of 3.1 Recommendation: Continue  evaluation for aortic valve intervention.  Will obtain TAVR protocol CTA tomorrow.   DG Chest 1 View  Result Date: 10/05/2023 CLINICAL DATA:  Follow up pulmonary edema. EXAM: CHEST  1 VIEW COMPARISON:  Radiographs 10/03/2023.  CT 10/03/2023. FINDINGS: 0847 hours. Stable cardiomegaly and aortic atherosclerosis. Interstitial pulmonary edema has mildly improved. The basilar airspace opacity seen on CT are not well visualized radiographically, although do not appear progressive. No significant pleural effusion or pneumothorax. No acute osseous findings identified. There is an old fracture of the left proximal humerus. Telemetry leads overlie the chest. IMPRESSION: Mildly improved interstitial pulmonary edema. The basilar airspace opacities seen on CT are not well visualized radiographically. Electronically Signed   By: Carey Bullocks M.D.   On: 10/05/2023 12:44   ECHOCARDIOGRAM COMPLETE  Result Date: 10/03/2023    ECHOCARDIOGRAM REPORT   Patient Name:   Kyle Matthews Date of Exam: 10/03/2023 Medical Rec #:  [id number]  Height:       68.0 in Accession #:    [id number] Weight:       152.6 lb Date of Birth:  1946/07/03   BSA:          1.822 m Patient Age:    77 years   BP:           111/68 mmHg Patient Gender: M          HR:           94 bpm. [facility] Procedure: 2D Echo, Cardiac Doppler and Color Doppler Indications:  SBE  History:        Patient has prior history of Echocardiogram examinations, most                 recent 05/02/2018. Acute MI, Arrythmias:RBBB,                 Signs/Symptoms:Chest Pain and Shortness of Breath; Risk                 Factors:Hypertension, Dyslipidemia and Current Smoker.  Sonographer:    Mikki Harbor Referring Phys: [id number] SHERI HAMMOCK IMPRESSIONS  1. Left ventricular ejection fraction, by estimation, is 55 to 60%. The left ventricle has normal function. The left ventricle has no regional wall motion abnormalities. There is mild left ventricular hypertrophy.  Left ventricular diastolic parameters are indeterminate. Elevated left atrial pressure.  2. Right ventricular systolic function is normal. The right ventricular size is normal. There is mildly elevated pulmonary artery systolic pressure.  3. Left atrial size was moderately dilated.  4. Right atrial size was moderately dilated.  5. The mitral valve is abnormal. Mild to moderate mitral valve regurgitation. No evidence of mitral stenosis.  6. The tricuspid valve is abnormal.  7. The aortic valve was not well visualized. There is severe calcifcation of the aortic valve. There is severe thickening of the aortic valve. Aortic valve regurgitation is moderate to severe. Severe aortic valve stenosis. Severe aortic stenosis is present. Aortic valve mean gradient measures 42.0 mmHg. Aortic valve peak gradient measures 69.2 mmHg. Aortic valve area, by VTI measures 0.89 cm.  8. The inferior vena cava is dilated in size with >50% respiratory variability, suggesting right atrial pressure of 8 mmHg. FINDINGS  Left Ventricle: Left ventricular ejection fraction, by estimation, is 55 to 60%. The left ventricle has normal function. The left ventricle has no regional wall motion abnormalities. The left ventricular internal cavity size was normal in size. There is  mild left ventricular hypertrophy. Left ventricular diastolic parameters are indeterminate. Elevated left atrial pressure. Right Ventricle: The right ventricular size is normal. Right vetricular wall thickness was not well visualized. Right ventricular systolic function is normal. There is mildly elevated pulmonary artery systolic pressure. The tricuspid regurgitant velocity  is 3.02 m/s, and with an assumed right atrial pressure of 8 mmHg, the estimated right ventricular systolic pressure is 44.5 mmHg. Left Atrium: Left atrial size was moderately dilated. Right Atrium: Right atrial size was moderately dilated. Pericardium: There is no evidence of pericardial effusion. Mitral  Valve: The mitral valve is abnormal. Mild mitral annular calcification. Mild to moderate mitral valve regurgitation. No evidence of mitral valve stenosis. MV peak gradient, 11.4 mmHg. The mean mitral valve gradient is 6.0 mmHg. Tricuspid Valve: The tricuspid valve is abnormal. Tricuspid valve regurgitation is mild . No evidence of tricuspid stenosis. Aortic Valve: The aortic valve was not well visualized. There is severe calcifcation of the aortic valve. There is severe thickening of the aortic valve. There is severe aortic valve annular calcification. Aortic valve regurgitation is moderate to severe. Aortic regurgitation PHT measures 227 msec. Severe aortic stenosis is present. Aortic valve mean gradient measures 42.0 mmHg. Aortic valve peak gradient measures 69.2 mmHg. Aortic valve area, by VTI measures 0.89 cm. Pulmonic Valve: The pulmonic valve was not well visualized. Pulmonic valve regurgitation is not visualized. No evidence of pulmonic stenosis. Aorta: The aortic root and ascending aorta are structurally normal, with no evidence of dilitation. Venous: The inferior vena cava is dilated in size with greater than 50% respiratory  variability, suggesting right atrial pressure of 8 mmHg. IAS/Shunts: No atrial level shunt detected by color flow Doppler.  LEFT VENTRICLE PLAX 2D LVIDd:         4.80 cm      Diastology LVIDs:         3.20 cm      LV e' medial:    6.96 cm/s LV PW:         1.10 cm      LV E/e' medial:  18.8 LV IVS:        1.10 cm      LV e' lateral:   9.25 cm/s LVOT diam:     1.90 cm      LV E/e' lateral: 14.2 LV SV:         91 LV SV Index:   50 LVOT Area:     2.84 cm  LV Volumes (MOD) LV vol d, MOD A2C: 111.0 ml LV vol d, MOD A4C: 141.0 ml LV vol s, MOD A2C: 58.4 ml LV vol s, MOD A4C: 43.7 ml LV SV MOD A2C:     52.6 ml LV SV MOD A4C:     141.0 ml LV SV MOD BP:      76.4 ml RIGHT VENTRICLE RV Basal diam:  3.80 cm RV Mid diam:    3.20 cm RV S prime:     15.30 cm/s TAPSE (M-mode): 2.2 cm LEFT ATRIUM              Index        RIGHT ATRIUM           Index LA diam:        3.80 cm 2.09 cm/m   RA Area:     21.80 cm LA Vol (A2C):   93.7 ml 51.44 ml/m  RA Volume:   76.90 ml  42.22 ml/m LA Vol (A4C):   75.9 ml 41.67 ml/m LA Biplane Vol: 84.6 ml 46.44 ml/m  AORTIC VALVE                     PULMONIC VALVE AV Area (Vmax):    0.93 cm      PV Vmax:       0.88 m/s AV Area (Vmean):   0.95 cm      PV Peak grad:  3.1 mmHg AV Area (VTI):     0.89 cm AV Vmax:           416.00 cm/s AV Vmean:          286.667 cm/s AV VTI:            1.020 m AV Peak Grad:      69.2 mmHg AV Mean Grad:      42.0 mmHg LVOT Vmax:         137.00 cm/s LVOT Vmean:        95.600 cm/s LVOT VTI:          0.320 m LVOT/AV VTI ratio: 0.31 AI PHT:            227 msec  AORTA Ao Root diam: 3.30 cm Ao Asc diam:  3.40 cm MITRAL VALVE                TRICUSPID VALVE MV Area (PHT): 3.63 cm     TR Peak grad:   36.5 mmHg MV Peak grad:  11.4 mmHg    TR Vmax:        302.00 cm/s MV Mean grad:  6.0 mmHg MV Vmax:       1.68 m/s     SHUNTS MV Vmean:      111.0 cm/s   Systemic VTI:  0.32 m MV Decel Time: 209 msec     Systemic Diam: 1.90 cm MV E velocity: 131.00 cm/s MV A velocity: 130.00 cm/s MV E/A ratio:  1.01 Dina Rich MD Electronically signed by Dina Rich MD Signature Date/Time: 10/03/2023/12:33:39 PM    Final    CT Angio Chest Pulmonary Embolism (PE) W or WO Contrast  Result Date: 10/03/2023 CLINICAL DATA:  Shortness of breath for 3 weeks. Upper abdominal pain. EXAM: CT ANGIOGRAPHY CHEST CT ABDOMEN AND PELVIS WITH CONTRAST TECHNIQUE: Multidetector CT imaging of the chest was performed using the standard protocol during bolus administration of intravenous contrast. Multiplanar CT image reconstructions and MIPs were obtained to evaluate the vascular anatomy. Multidetector CT imaging of the abdomen and pelvis was performed using the standard protocol during bolus administration of intravenous contrast. RADIATION DOSE REDUCTION: This exam was performed  according to the departmental dose-optimization program which includes automated exposure control, adjustment of the mA and/or kV according to patient size and/or use of iterative reconstruction technique. CONTRAST:  OMNIPAQUE IOHEXOL 350 MG/ML SOLN COMPARISON:  None Available. FINDINGS: CTA CHEST FINDINGS Cardiovascular: Heart size upper normal. No substantial pericardial effusion. Coronary artery calcification is evident. Moderate atherosclerotic calcification is noted in the wall of the thoracic aorta. There is no filling defect within the opacified pulmonary arteries to suggest the presence of an acute pulmonary embolus. Mediastinum/Nodes: Scattered upper normal mediastinal lymph nodes evident, likely reactive. There is no hilar lymphadenopathy. The esophagus has normal imaging features. There is no axillary lymphadenopathy. Lungs/Pleura: Centrilobular emphsyema noted. Interlobular septal thickening is noted bilaterally with patchy ground-glass airspace disease in the right middle and both lower lobes. Dense consolidative opacity is seen in the dependent lower lobes bilaterally. Small bilateral pleural effusions evident. Musculoskeletal: Bone windows reveal no worrisome lytic or sclerotic osseous lesions. Review of the MIP images confirms the above findings. CT ABDOMEN and PELVIS FINDINGS Hepatobiliary: 2.9 cm simple cyst identified right hepatic lobe. Scattered tiny hypodensities in the liver parenchyma are too small to characterize but are statistically most likely benign. No followup imaging is recommended. Gallbladder lumen is filled with tiny stones. No intrahepatic or extrahepatic biliary dilation. Pancreas: No focal mass lesion. No dilatation of the main duct. No intraparenchymal cyst. No peripancreatic edema. Spleen: No splenomegaly. No suspicious focal mass lesion. Adrenals/Urinary Tract: No adrenal nodule or mass. 6.1 cm exophytic cystic lesion upper pole right kidney demonstrates mural  calcification. No mural thickening or nodularity by CT imaging. Numerous tiny hypodensities are seen in both kidneys, too small to characterize. 18 mm lesion interpolar left kidney on 28/2 has attenuation too high to be a simple cyst. Mild fullness noted both intrarenal collecting systems. No hydroureter. Circumferential bladder wall thickening evident. Urinary bladder is decompressed by Foley catheter. Stomach/Bowel: Stomach is unremarkable. No gastric wall thickening. No evidence of outlet obstruction. Duodenum is normally positioned as is the ligament of Treitz. No small bowel wall thickening. No small bowel dilatation. The terminal ileum is normal. Appendix is obscured by motion artifact but shows no definite periappendiceal edema or inflammation. No gross colonic mass. No colonic wall thickening. Diverticular changes are noted in the left colon without evidence of diverticulitis. Vascular/Lymphatic: There is advanced atherosclerotic calcification of the abdominal aorta without aneurysm. There is no gastrohepatic or hepatoduodenal ligament lymphadenopathy. No retroperitoneal or mesenteric lymphadenopathy. No  pelvic sidewall lymphadenopathy. Reproductive: Prostate gland is enlarged. Other: No intraperitoneal free fluid. Musculoskeletal: Bone windows reveal no worrisome lytic or sclerotic osseous lesions. Review of the MIP images confirms the above findings. IMPRESSION: 1. No CT evidence for acute pulmonary embolus. 2. Patchy areas of ground-glass opacity in both lungs suspicious for multifocal pneumonia. 3. Dense consolidative opacity in the dependent lower lobes bilaterally. Imaging features likely related to atelectasis although superimposed pneumonia in these regions not excluded. 4. Small bilateral pleural effusions. 5. Cholelithiasis. 6. 18 mm lesion interpolar left kidney has attenuation too high to be a simple cyst. Follow-up outpatient nonemergent MRI of the abdomen with and without contrast recommended  to further evaluate. 7. 6.1 cm exophytic cystic lesion upper pole right kidney demonstrates mural calcification. No mural thickening or nodularity by CT imaging. 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 8. Circumferential bladder wall thickening with mild fullness of both intrarenal collecting systems. Imaging features may be related to chronic bladder outlet obstruction given the enlarged prostate gland. 9. Left colonic diverticulosis without diverticulitis. 10. Aortic Atherosclerosis (ICD10-I70.0) and Emphysema (ICD10-J43.9). Electronically Signed   By: Kennith Center M.D.   On: 10/03/2023 06:56   CT ABDOMEN PELVIS W CONTRAST  Result Date: 10/03/2023 CLINICAL DATA:  Shortness of breath for 3 weeks. Upper abdominal pain. EXAM: CT ANGIOGRAPHY CHEST CT ABDOMEN AND PELVIS WITH CONTRAST TECHNIQUE: Multidetector CT imaging of the chest was performed using the standard protocol during bolus administration of intravenous contrast. Multiplanar CT image reconstructions and MIPs were obtained to evaluate the vascular anatomy. Multidetector CT imaging of the abdomen and pelvis was performed using the standard protocol during bolus administration of intravenous contrast. RADIATION DOSE REDUCTION: This exam was performed according to the departmental dose-optimization program which includes automated exposure control, adjustment of the mA and/or kV according to patient size and/or use of iterative reconstruction technique. CONTRAST:  OMNIPAQUE IOHEXOL 350 MG/ML SOLN COMPARISON:  None Available. FINDINGS: CTA CHEST FINDINGS Cardiovascular: Heart size upper normal. No substantial pericardial effusion. Coronary artery calcification is evident. Moderate atherosclerotic calcification is noted in the wall of the thoracic aorta. There is no filling defect within the opacified pulmonary arteries to suggest the presence of an acute pulmonary embolus. Mediastinum/Nodes: Scattered upper normal  mediastinal lymph nodes evident, likely reactive. There is no hilar lymphadenopathy. The esophagus has normal imaging features. There is no axillary lymphadenopathy. Lungs/Pleura: Centrilobular emphsyema noted. Interlobular septal thickening is noted bilaterally with patchy ground-glass airspace disease in the right middle and both lower lobes. Dense consolidative opacity is seen in the dependent lower lobes bilaterally. Small bilateral pleural effusions evident. Musculoskeletal: Bone windows reveal no worrisome lytic or sclerotic osseous lesions. Review of the MIP images confirms the above findings. CT ABDOMEN and PELVIS FINDINGS Hepatobiliary: 2.9 cm simple cyst identified right hepatic lobe. Scattered tiny hypodensities in the liver parenchyma are too small to characterize but are statistically most likely benign. No followup imaging is recommended. Gallbladder lumen is filled with tiny stones. No intrahepatic or extrahepatic biliary dilation. Pancreas: No focal mass lesion. No dilatation of the main duct. No intraparenchymal cyst. No peripancreatic edema. Spleen: No splenomegaly. No suspicious focal mass lesion. Adrenals/Urinary Tract: No adrenal nodule or mass. 6.1 cm exophytic cystic lesion upper pole right kidney demonstrates mural calcification. No mural thickening or nodularity by CT imaging. Numerous tiny hypodensities are seen in both kidneys, too small to characterize. 18 mm lesion interpolar left kidney on 28/2 has attenuation too high to be a simple  cyst. Mild fullness noted both intrarenal collecting systems. No hydroureter. Circumferential bladder wall thickening evident. Urinary bladder is decompressed by Foley catheter. Stomach/Bowel: Stomach is unremarkable. No gastric wall thickening. No evidence of outlet obstruction. Duodenum is normally positioned as is the ligament of Treitz. No small bowel wall thickening. No small bowel dilatation. The terminal ileum is normal. Appendix is obscured by  motion artifact but shows no definite periappendiceal edema or inflammation. No gross colonic mass. No colonic wall thickening. Diverticular changes are noted in the left colon without evidence of diverticulitis. Vascular/Lymphatic: There is advanced atherosclerotic calcification of the abdominal aorta without aneurysm. There is no gastrohepatic or hepatoduodenal ligament lymphadenopathy. No retroperitoneal or mesenteric lymphadenopathy. No pelvic sidewall lymphadenopathy. Reproductive: Prostate gland is enlarged. Other: No intraperitoneal free fluid. Musculoskeletal: Bone windows reveal no worrisome lytic or sclerotic osseous lesions. Review of the MIP images confirms the above findings. IMPRESSION: 1. No CT evidence for acute pulmonary embolus. 2. Patchy areas of ground-glass opacity in both lungs suspicious for multifocal pneumonia. 3. Dense consolidative opacity in the dependent lower lobes bilaterally. Imaging features likely related to atelectasis although superimposed pneumonia in these regions not excluded. 4. Small bilateral pleural effusions. 5. Cholelithiasis. 6. 18 mm lesion interpolar left kidney has attenuation too high to be a simple cyst. Follow-up outpatient nonemergent MRI of the abdomen with and without contrast recommended to further evaluate. 7. 6.1 cm exophytic cystic lesion upper pole right kidney demonstrates mural calcification. No mural thickening or nodularity by CT imaging. 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 8. Circumferential bladder wall thickening with mild fullness of both intrarenal collecting systems. Imaging features may be related to chronic bladder outlet obstruction given the enlarged prostate gland. 9. Left colonic diverticulosis without diverticulitis. 10. Aortic Atherosclerosis (ICD10-I70.0) and Emphysema (ICD10-J43.9). Electronically Signed   By: Kennith Center M.D.   On: 10/03/2023 06:56   DG Chest Port 1 View  Result Date:  10/03/2023 CLINICAL DATA:  Chest pain and shortness of breath. EXAM: PORTABLE CHEST 1 VIEW COMPARISON:  None Available. FINDINGS: The cardio pericardial silhouette is enlarged. Interstitial markings are diffusely coarsened with chronic features. Vascular congestion noted without overt airspace pulmonary edema. No focal airspace consolidation or substantial pleural effusion. Bones are diffusely demineralized. Telemetry leads overlie the chest. IMPRESSION: 1. Enlargement of the cardiopericardial silhouette with vascular congestion. 2. Chronic interstitial coarsening. Electronically Signed   By: Kennith Center M.D.   On: 10/03/2023 05:58    Labs:  CBC: Recent Labs    10/23/23 1700 10/24/23 0556 10/25/23 0401 10/25/23 0755 10/25/23 1847 10/26/23 0341  WBC 10.0 10.8* 8.1  --   --  6.8  HGB 8.4* 8.7* 7.2* 7.3* 7.9* 7.8*  HCT 26.0* 26.7* 22.5* 23.2* 24.7* 24.1*  PLT 163 177 164  --   --  181    COAGS: Recent Labs    10/03/23 0412 10/04/23 0509 10/05/23 0253 10/23/23 0129  INR 1.1 1.1 1.1 1.3*  APTT  --   --  29 >200*    BMP: Recent Labs    10/23/23 0800 10/24/23 0556 10/25/23 0401 10/26/23 0341  NA 136 135 135 137  K 4.1 4.5 3.6 4.0  CL 105 102 103 104  CO2 21* 25 26 27   GLUCOSE 106* 110* 103* 98  BUN 18 27* 29* 28*  CALCIUM 8.4* 8.3* 7.8* 7.6*  CREATININE 0.90 1.36* 1.03 0.98  GFRNONAA >60 54* >60 >60    LIVER FUNCTION TESTS: Recent Labs    10/23/23  0129 10/23/23 0800 10/24/23 0556 10/25/23 0401  BILITOT 0.4 1.2* 0.7 0.7  AST 27 19 20  12*  ALT 18 18 17 13   ALKPHOS 37* 37* 40 39  PROT 4.9* 5.1* 5.4* 4.6*  ALBUMIN 2.6* 2.7* 2.6* 2.1*    TUMOR MARKERS: No results for input(s): "AFPTM", "CEA", "CA199", "CHROMGRNA" in the last 8760 hours.  Assessment and Plan:  Scheduled for Prostate artery embolization in IR 11/8 Risks and benefits of prostate artery embolization were discussed with the patient including, but not limited to bleeding, infection, vascular  injury or contrast induced renal failure.  This interventional procedure involves the use of X-rays and because of the nature of the planned procedure, it is possible that we will have prolonged use of X-ray fluoroscopy.  Potential radiation risks to you include (but are not limited to) the following: - A slightly elevated risk for cancer  several years later in life. This risk is typically less than 0.5% percent. This risk is low in comparison to the normal incidence of human cancer, which is 33% for women and 50% for men according to the [employer]. - Radiation induced injury can include skin redness, resembling a rash, tissue breakdown / ulcers and hair loss (which can be temporary or permanent).   The likelihood of either of these occurring depends on the difficulty of the procedure and whether you are sensitive to radiation due to previous procedures, disease, or genetic conditions.   IF your procedure requires a prolonged use of radiation, you will be notified and given written instructions for further action.  It is your responsibility to monitor the irradiated area for the 2 weeks following the procedure and to notify your physician if you are concerned that you have suffered a radiation induced injury.    All of the patient's questions were answered, patient is agreeable to proceed.  Consent signed and in chart.  Thank you for this interesting consult.  I greatly enjoyed meeting Kyle Matthews and look forward to participating in their care.  A copy of this report was sent to the requesting provider on this date.  Electronically Signed: Robet Leu, PA-C 10/26/2023, 12:17 PM   I spent a total of 20 Minutes    in face to face in clinical consultation, greater than 50% of which was counseling/coordinating care for PAE

## 2023-10-26 NOTE — Plan of Care (Signed)

## 2023-10-26 NOTE — Progress Notes (Signed)
Rounding Note    Patient Name: Kyle Matthews Date of Encounter: 10/26/2023  [facility] Cardiologist: Maisie Fus, MD   Subjective  Interval history: Kyle Matthews presented with hematuria and hemoglobin of 5.4.  He also had diffuse ST elevation on his EKG and mild aVR elevation.  He underwent blood transfusions.  He was seen by urology that would recommend prostatectomy however he is high risk for surgery.  He is currently on androgen deprivation therapy to help with his hematuria prior to his CABG/AVR. On 11/5-His Lasix was increased.  Overnight he had gross hematuria.  Therefore urology was reinvolved.  They recommended prostate artery embolization by IR for 11/6. 11/7- still pending decision on IR embolization.  Otherwise he feels better overall.  His kidney function continues to be normal Troponin 482-> 1234 Hemoglobin 7s, stable   Inpatient Medications    Scheduled Meds:  sodium chloride   Intravenous Once   Chlorhexidine Gluconate Cloth  6 each Topical Daily   finasteride  5 mg Oral QPM   furosemide  40 mg Intravenous BID   pantoprazole  40 mg Oral Daily   rosuvastatin  40 mg Oral Daily   Continuous Infusions:   PRN Meds: acetaminophen **OR** acetaminophen, hydrALAZINE, ipratropium-albuterol, melatonin, metoprolol tartrate, morphine injection, ondansetron (ZOFRAN) IV, oxyCODONE, senna-docusate   Vital Signs    Vitals:   10/25/23 2335 10/26/23 0302 10/26/23 0435 10/26/23 0728  BP: 95/60  (!) 94/52 (!) 109/55  Pulse: 73  71 66  Resp: 18  20 20   Temp: 99 F (37.2 C)  98.7 F (37.1 C) 98.5 F (36.9 C)  TempSrc: Oral  Oral Oral  SpO2: 91%  95% 95%  Weight:  70.6 kg    Height:        Intake/Output Summary (Last 24 hours) at 10/26/2023 1018 Last data filed at 10/26/2023 1004 Gross per 24 hour  Intake 16109 ml  Output 27200 ml  Net -1052 ml      10/26/2023    3:02 AM 10/25/2023    7:28 AM 10/23/2023    3:59 PM  Last 3 Weights  Weight (lbs) 155 lb 10.3  oz 155 lb 6.8 oz 152 lb 5.4 oz  Weight (kg) 70.6 kg 70.5 kg 69.1 kg      Telemetry    Sinus rhythm - Personally Reviewed  ECG  No new  Initial Sinus rhythm with diffuse ST depression and mild aVR elevation- Personally Reviewed  Physical Exam   Vitals:   10/26/23 0435 10/26/23 0728  BP: (!) 94/52 (!) 109/55  Pulse: 71 66  Resp: 20 20  Temp: 98.7 F (37.1 C) 98.5 F (36.9 C)  SpO2: 95% 95%    GEN: No acute distress.  Looks less deconditioned has better pallor Neck: Sitting up in a chair, not optimal for JVD assessment Cardiac: RRR, SEM RUSB that radiates to the apex Respiratory: nl wob, mild decreased breath sounds in the bases GI:  non-distended  MS: Improved bilateral lower extremity edema Neuro:  Nonfocal  Psych: Normal affect   Labs    High Sensitivity Troponin:   Recent Labs  Lab 10/03/23 1521 10/04/23 0505 10/23/23 0129 10/23/23 0740 10/23/23 1700  TROPONINIHS 4,814* 3,357* 369* 482* 1,234*     Chemistry Recent Labs  Lab 10/23/23 0800 10/24/23 0556 10/25/23 0401 10/26/23 0341  NA 136 135 135 137  K 4.1 4.5 3.6 4.0  CL 105 102 103 104  CO2 21* 25 26 27   GLUCOSE 106* 110* 103*  98  BUN 18 27* 29* 28*  CREATININE 0.90 1.36* 1.03 0.98  CALCIUM 8.4* 8.3* 7.8* 7.6*  MG 1.7 1.9 2.2 2.2  PROT 5.1* 5.4* 4.6*  --   ALBUMIN 2.7* 2.6* 2.1*  --   AST 19 20 12*  --   ALT 18 17 13   --   ALKPHOS 37* 40 39  --   BILITOT 1.2* 0.7 0.7  --   GFRNONAA >60 54* >60 >60  ANIONGAP 10 8 6 6     Lipids  Recent Labs  Lab 10/23/23 0129  CHOL 122  TRIG 74  HDL 52  LDLCALC 55  CHOLHDL 2.3    Hematology Recent Labs  Lab 10/24/23 0556 10/25/23 0401 10/25/23 0755 10/25/23 1847 10/26/23 0341  WBC 10.8* 8.1  --   --  6.8  RBC 2.93* 2.53*  --   --  2.72*  HGB 8.7* 7.2* 7.3* 7.9* 7.8*  HCT 26.7* 22.5* 23.2* 24.7* 24.1*  MCV 91.1 88.9  --   --  88.6  MCH 29.7 28.5  --   --  28.7  MCHC 32.6 32.0  --   --  32.4  RDW 16.0* 15.7*  --   --  15.3  PLT 177 164   --   --  181   Thyroid No results for input(s): "TSH", "FREET4" in the last 168 hours.  BNPNo results for input(s): "BNP", "PROBNP" in the last 168 hours.  DDimer No results for input(s): "DDIMER" in the last 168 hours.   Radiology    No results found.  Cardiac Studies   TTE 10/03/2023 1. Left ventricular ejection fraction, by estimation, is 55 to 60%. The  left ventricle has normal function. The left ventricle has no regional  wall motion abnormalities. There is mild left ventricular hypertrophy.  Left ventricular diastolic parameters  are indeterminate. Elevated left atrial pressure.   2. Right ventricular systolic function is normal. The right ventricular size is normal. There is mildly elevated pulmonary artery systolic pressure.   3. Left atrial size was moderately dilated.   4. Right atrial size was moderately dilated.   5. The mitral valve is abnormal. Mild to moderate mitral valve regurgitation. No evidence of mitral stenosis.   6. The tricuspid valve is abnormal.   7. The aortic valve was not well visualized. There is severe calcifcation of the aortic valve. There is severe thickening of the aortic valve.  Aortic valve regurgitation is moderate to severe. Severe aortic valve stenosis. Severe aortic stenosis is  present. Aortic valve mean gradient measures 42.0 mmHg. Aortic valve peak gradient measures 69.2 mmHg. Aortic valve area, by VTI measures 0.89 cm.   8. The inferior vena cava is dilated in size with >50% respiratory variability, suggesting right atrial pressure of 8 mmHg.    LHC 10/09/2023   Ost LM lesion is 50% stenosed.   1.  Moderate left main stenosis with an RFR of 0.83. 2.  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 3.  Fick cardiac output of 6.5 L/min and Fick cardiac index of 3.8 L/min/m with the following hemodynamics:             Right atrial pressure mean of 7             Right ventricular pressure 42/-2 with an end-diastolic pressure of 8  mmHg             Wedge pressure mean of 14 mmHg with V waves to 23 mmHg  PA pressure of 38/16 with a mean of 24 mmHg             PVR of 1.5 Woods units             PA pulsatility index of 3.1   Recommendation: Continue evaluation for aortic valve intervention.  Will obtain TAVR protocol CTA tomorrow.  Patient Profile     77 y.o. 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   Assessment & Plan    Bladder outlet obstruction Hematuria -Recommend hemoglobin greater than 8 -Continue with urology workup and possible IR embolization  CAD- 50% LM CP- demand ischemia -His EKG shows diffuse ST depression and some mild elevation in aVR.  He has known 50% left main disease.  He is pending a CABG on the 14th.  CT surgery is aware that he is here and the plan is to continue to wait for his scheduled date. -Antiplatelets are on hold secondary to hematuria, as well cannot do anticoagulation -Continue Crestor 40 mg daily -He is hypotensive holding off on beta-blocker  HFpEF -He has improved from a respiratory standpoint and has had some low blood pressures.  Will scale back on his Lasix from 40 IV twice daily to 20 mg IV daily starting tomorrow.  His renal function remains normal  Severe AS -He is pending a TAVR  Overall, he has multiple comorbidities in the setting of hematuria and anemia.  He is tenuous, he will stay until his procedure and receive medical management;  (CABG/TAVR) 11/14.   Time Spent Directly with Patient:  I have spent a total of 35 minutes with the patient reviewing hospital notes, telemetry, EKGs, labs and examining the patient as well as establishing an assessment and plan that was discussed personally with the patient.  > 50% of time was spent in direct patient care.      For questions  or updates, please contact [facility] Please consult [email or web address] for contact info under        Signed, Maisie Fus, MD  10/26/2023, 10:18 AM

## 2023-10-26 NOTE — Progress Notes (Signed)
RN spoke with Urology NP - CBI titrated down for goal of "pink lemonade" color.  Will continue to monitor.

## 2023-10-26 NOTE — Progress Notes (Signed)
PROGRESS NOTE    Tadd Holtmeyer  NGE:[id number] DOB: Apr 29, 1946 DOA: 10/23/2023 PCP: Mila Palmer, MD     Brief Narrative:    Kyle Matthews is a 77 y.o. male with medical history significant for severe aortic stenosis, BPH with recurrent gross hematuria, chronic blood loss anemia associated baseline hemoglobin 7.5-10, who is admitted to [facility] on 10/23/2023 with NSTEMI after presenting from home to [facility] ED complaining of chest pain.  He was admitted to the hospital about 2 weeks ago for chest pain and hematuria.  Eventual plan was to challenge him with DAPT for couple of weeks and if tolerates, proceed with TAVR/PCI.  Seen by urology but now developed hematuria again.  IR consulted for prostate artery embolization.     Assessment & Plan:   Principal Problem:   NSTEMI (non-ST elevated myocardial infarction) (HCC) Active Problems:   Acute on chronic blood loss anemia   Severe aortic stenosis   BPH (benign prostatic hyperplasia)   Hyperlipidemia   Chest pain   History of essential hypertension   GERD (gastroesophageal reflux disease)   Acute Blood Loss Anemia secondary to hematuria BPH with chronic indwelling Foley catheter Unfortunately patient has been having recurrent hematuria.  Urology planning ADT for now.  Status post multiple units of PRBC transfusion.  Additional units as necessary.  Followed by urology, IR consulted as patient is currently getting CBI and will require prostate artery embolization.   Severe aortic stenosis Acute on chronic congestive heart failure with preserved EF CAD Recently patient was placed on DAPT challenge but did not tolerate therefore discontinued due to hematuria.  Considering near future CABG/TAVR evaluation.  Cardiology following, Lasix if blood pressure tolerates  Acute Hypoxic Respiratory Failure Shortness of Breath Respirations much better today.  Lasix had to be held yesterday evening due to soft blood pressures.  Dose reduced today    Acute Kidney Injury Resolved.  Peaked at 1.3   Hypertension IV as needed   GERD PPI       DVT prophylaxis: SCD Code Status: full Family Communication: wife at bedside Disposition:    Status is: Inpatient Remains inpatient appropriate because: continued need for inpatient care for hematuria   Consultants:  Urology cardiology   Procedures:  none      Subjective: No complaints.  Overnight still had persistent hematuria.  Physical exam: Constitutional: Not in acute distress Respiratory: Clear to auscultation bilaterally Cardiovascular: Normal sinus rhythm, no rubs Abdomen: Nontender nondistended good bowel sounds Musculoskeletal: No edema noted Skin: No rashes seen Neurologic: CN 2-12 grossly intact.  And nonfocal Psychiatric: Normal judgment and insight. Alert and oriented x 3. Normal mood. Foley bag-persistent hematuria noted           Pressure Injury 10/25/23 Ear Left Stage 1 -  Intact skin with non-blanchable redness of a localized area usually over a bony prominence. behind left ear due to nasal cannula (Active)  10/25/23 1035  Location: Ear  Location Orientation: Left  Staging: Stage 1 -  Intact skin with non-blanchable redness of a localized area usually over a bony prominence.  Wound Description (Comments): behind left ear due to nasal cannula  Present on Admission:      Diet Orders (From admission, onward)     Start     Ordered   10/23/23 0358  Diet regular Room service appropriate? Yes; Fluid consistency: Thin  Diet effective now       Question Answer Comment  Room service appropriate? Yes   Fluid consistency: Thin  10/23/23 0357            Objective: Vitals:   10/26/23 0435 10/26/23 0728 10/26/23 0810 10/26/23 1130  BP: (!) 94/52 (!) 109/55  (!) 105/58  Pulse: 71 66 66 87  Resp: 20 20  18   Temp: 98.7 F (37.1 C) 98.5 F (36.9 C)  98.5 F (36.9 C)  TempSrc: Oral Oral  Oral  SpO2: 95% 95% 95% 92%  Weight:      Height:         Intake/Output Summary (Last 24 hours) at 10/26/2023 1145 Last data filed at 10/26/2023 1101 Gross per 24 hour  Intake 23178 ml  Output 24605 ml  Net -1427 ml   Filed Weights   10/23/23 1559 10/25/23 0728 10/26/23 0302  Weight: 69.1 kg 70.5 kg 70.6 kg    Scheduled Meds:  sodium chloride   Intravenous Once   Chlorhexidine Gluconate Cloth  6 each Topical Daily   finasteride  5 mg Oral QPM   [START ON 10/27/2023] furosemide  20 mg Intravenous Daily   pantoprazole  40 mg Oral Daily   rosuvastatin  40 mg Oral Daily   Continuous Infusions:  Nutritional status     Body mass index is 27.57 kg/m.  Data Reviewed:   CBC: Recent Labs  Lab 10/23/23 0129 10/23/23 0131 10/23/23 1700 10/24/23 0556 10/25/23 0401 10/25/23 0755 10/25/23 1847 10/26/23 0341  WBC 10.9*  --  10.0 10.8* 8.1  --   --  6.8  NEUTROABS 8.8*  --  7.3 8.2* 6.3  --   --   --   HGB 5.2*   < > 8.4* 8.7* 7.2* 7.3* 7.9* 7.8*  HCT 17.4*   < > 26.0* 26.7* 22.5* 23.2* 24.7* 24.1*  MCV 93.5  --  90.9 91.1 88.9  --   --  88.6  PLT 211  --  163 177 164  --   --  181   < > = values in this interval not displayed.   Basic Metabolic Panel: Recent Labs  Lab 10/23/23 0129 10/23/23 0131 10/23/23 0740 10/23/23 0800 10/24/23 0556 10/25/23 0401 10/26/23 0341  NA 137 137  --  136 135 135 137  K 4.2 4.2  --  4.1 4.5 3.6 4.0  CL 106 103  --  105 102 103 104  CO2 24  --   --  21* 25 26 27   GLUCOSE 124* 118*  --  106* 110* 103* 98  BUN 21 26*  --  18 27* 29* 28*  CREATININE 0.94 1.00  --  0.90 1.36* 1.03 0.98  CALCIUM 8.2*  --   --  8.4* 8.3* 7.8* 7.6*  MG  --   --  1.7 1.7 1.9 2.2 2.2  PHOS  --   --   --   --  5.7* 4.3  --    GFR: Estimated Creatinine Clearance: 57.5 mL/min (by C-G formula based on SCr of 0.98 mg/dL). Liver Function Tests: Recent Labs  Lab 10/23/23 0129 10/23/23 0800 10/24/23 0556 10/25/23 0401  AST 27 19 20  12*  ALT 18 18 17 13   ALKPHOS 37* 37* 40 39  BILITOT 0.4 1.2* 0.7 0.7   PROT 4.9* 5.1* 5.4* 4.6*  ALBUMIN 2.6* 2.7* 2.6* 2.1*   No results for input(s): "LIPASE", "AMYLASE" in the last 168 hours. No results for input(s): "AMMONIA" in the last 168 hours. Coagulation Profile: Recent Labs  Lab 10/23/23 0129  INR 1.3*   Cardiac Enzymes: No results for input(s): "  CKTOTAL", "CKMB", "CKMBINDEX", "TROPONINI" in the last 168 hours. BNP (last 3 results) No results for input(s): "PROBNP" in the last 8760 hours. HbA1C: No results for input(s): "HGBA1C" in the last 72 hours. CBG: No results for input(s): "GLUCAP" in the last 168 hours. Lipid Profile: No results for input(s): "CHOL", "HDL", "LDLCALC", "TRIG", "CHOLHDL", "LDLDIRECT" in the last 72 hours. Thyroid Function Tests: No results for input(s): "TSH", "T4TOTAL", "FREET4", "T3FREE", "THYROIDAB" in the last 72 hours. Anemia Panel: No results for input(s): "VITAMINB12", "FOLATE", "FERRITIN", "TIBC", "IRON", "RETICCTPCT" in the last 72 hours. Sepsis Labs: Recent Labs  Lab 10/23/23 0132  LATICACIDVEN 1.3    No results found for this or any previous visit (from the past 240 hour(s)).       Radiology Studies: No results found.         LOS: 3 days   Time spent= 35 mins    Miguel Rota, MD [facility]  If 7PM-7AM, please contact night-coverage  10/26/2023, 11:45 AM

## 2023-10-26 NOTE — Progress Notes (Signed)
     Subjective: Tolerating CBI well. Occasional spasm, likely causing some of the suspected pauses in irrigant drainage. Providing antispasmodics. Again reviewed PAE with patient and wife. They are still in agreement.  Objective: Vital signs in last 24 hours: Temp:  [98 F (36.7 C)-99 F (37.2 C)] 98 F (36.7 C) (11/07 1404) Pulse Rate:  [66-90] 90 (11/07 1404) Resp:  [18-20] 18 (11/07 1130) BP: (94-123)/(52-60) 123/56 (11/07 1404) SpO2:  [91 %-95 %] 95 % (11/07 1404) Weight:  [70.6 kg] 70.6 kg (11/07 0302)  Assessment/Plan: #ABLA #Hematuria   Loading dose of Firmagon provided.  Will continue ADT through to prostate surgery in an effort to reduce prostate size, vascularity, and bleeding.   Ongoing bleeding requiring additional transfusion yesterday. Foley catheter for three-way 88f hematuria coud.  Continue CBI in an effort to prevent clot obstruction, despite prostatic source. Discussed w/ Dr. Berneice Heinrich, Dr. Wyline Mood, and IR. This must be controlled prior to his CT surgery, which may require long term anti-coagulation. Proceeding with PAE 11/8 in IR.  Intake/Output from previous day: 11/06 0701 - 11/07 0700 In: 40981 [P.O.:1030; Blood:630] Out: 23000 [Urine:23000]  Intake/Output this shift: Total I/O In: 9518 [P.O.:358; Other:9160] Out: 9780 [Urine:9780]  Physical Exam:  General: Alert and oriented CV: No cyanosis Lungs: equal chest rise Gu: 3-way 31f hematuria daily.  CBI on low gtt.  Lab Results: Recent Labs    10/25/23 0755 10/25/23 1847 10/26/23 0341  HGB 7.3* 7.9* 7.8*  HCT 23.2* 24.7* 24.1*   BMET Recent Labs    10/25/23 0401 10/26/23 0341  NA 135 137  K 3.6 4.0  CL 103 104  CO2 26 27  GLUCOSE 103* 98  BUN 29* 28*  CREATININE 1.03 0.98  CALCIUM 7.8* 7.6*     Studies/Results: No results found.    LOS: 3 days   Elmon Kirschner, NP [facility] Pager: [phone]  10/26/2023, 5:26 PM

## 2023-10-26 NOTE — Progress Notes (Signed)
RN called to pts room.  Pt complaining of 5/10 chest pain stating the pain was "tightness" and "bloating".  MD notified.  Pt vomited small amount clear/brown emesis.  PRN administered.  Vitals obtained and EKG obtained.

## 2023-10-26 NOTE — Care Management Important Message (Signed)
Important Message  Patient Details  Name: Kyle Matthews MRN: [id number] Date of Birth: Nov 25, 1946   Important Message Given:  Yes - Medicare IM     Dorena Bodo 10/26/2023, 1:01 PM

## 2023-10-26 NOTE — Plan of Care (Signed)

## 2023-10-26 NOTE — Progress Notes (Signed)
Patient called nurses station approximately 0700 reporting leaking around foley insertion site. Assessed CBI set-up. Noted not flowing appropriately. Used appropriate 60cc piston syringe to manually irrigate. Several large multiple clots removed. Flow returned and CBI currently running wide open. Oncoming RN at bedside and aware of the above. Patient denies bladder discomfort at this time.

## 2023-10-27 ENCOUNTER — Inpatient Hospital Stay (HOSPITAL_COMMUNITY): Payer: Medicare Other

## 2023-10-27 DIAGNOSIS — I214 Non-ST elevation (NSTEMI) myocardial infarction: Secondary | ICD-10-CM | POA: Diagnosis not present

## 2023-10-27 HISTORY — PX: IR ANGIOGRAM PELVIS SELECTIVE OR SUPRASELECTIVE: IMG661

## 2023-10-27 HISTORY — PX: IR US GUIDE VASC ACCESS RIGHT: IMG2390

## 2023-10-27 HISTORY — PX: IR EMBO ARTERIAL NOT HEMORR HEMANG INC GUIDE ROADMAPPING: IMG5448

## 2023-10-27 HISTORY — PX: IR ANGIOGRAM SELECTIVE EACH ADDITIONAL VESSEL: IMG667

## 2023-10-27 LAB — CBC
HCT: 25.9 % — ABNORMAL LOW (ref 39.0–52.0)
Hemoglobin: 8.4 g/dL — ABNORMAL LOW (ref 13.0–17.0)
MCH: 29.3 pg (ref 26.0–34.0)
MCHC: 32.4 g/dL (ref 30.0–36.0)
MCV: 90.2 fL (ref 80.0–100.0)
Platelets: 180 K/uL (ref 150–400)
RBC: 2.87 MIL/uL — ABNORMAL LOW (ref 4.22–5.81)
RDW: 14.9 % (ref 11.5–15.5)
WBC: 4.8 K/uL (ref 4.0–10.5)
nRBC: 0 % (ref 0.0–0.2)

## 2023-10-27 LAB — BASIC METABOLIC PANEL WITH GFR
Anion gap: 7 (ref 5–15)
BUN: 25 mg/dL — ABNORMAL HIGH (ref 8–23)
CO2: 27 mmol/L (ref 22–32)
Calcium: 8.1 mg/dL — ABNORMAL LOW (ref 8.9–10.3)
Chloride: 104 mmol/L (ref 98–111)
Creatinine, Ser: 1.04 mg/dL (ref 0.61–1.24)
GFR, Estimated: >60 mL/min
Glucose, Bld: 102 mg/dL — ABNORMAL HIGH (ref 70–99)
Potassium: 3.8 mmol/L (ref 3.5–5.1)
Sodium: 138 mmol/L (ref 135–145)

## 2023-10-27 LAB — MAGNESIUM: Magnesium: 2.3 mg/dL (ref 1.7–2.4)

## 2023-10-27 MED ORDER — IOHEXOL 300 MG/ML  SOLN
50.0000 mL | Freq: Once | INTRAMUSCULAR | Status: AC | PRN
Start: 2023-10-27 — End: 2023-10-27
  Administered 2023-10-27: 25 mL via INTRAVENOUS

## 2023-10-27 MED ORDER — FENTANYL CITRATE (PF) 100 MCG/2ML IJ SOLN
INTRAMUSCULAR | Status: AC
  Filled 2023-10-27: qty 4

## 2023-10-27 MED ORDER — VERAPAMIL HCL 2.5 MG/ML IV SOLN
INTRAVENOUS | Status: AC | PRN
  Administered 2023-10-27: 2.5 mg via INTRA_ARTERIAL

## 2023-10-27 MED ORDER — MIDAZOLAM HCL 2 MG/2ML IJ SOLN
INTRAMUSCULAR | Status: AC
Start: 2023-10-27 — End: ?
  Filled 2023-10-27: qty 4

## 2023-10-27 MED ORDER — IOHEXOL 300 MG/ML  SOLN
100.0000 mL | Freq: Once | INTRAMUSCULAR | Status: AC | PRN
  Administered 2023-10-27: 50 mL via INTRA_ARTERIAL

## 2023-10-27 MED ORDER — SODIUM CHLORIDE 0.9 % IV SOLN
INTRAVENOUS | Status: AC
  Filled 2023-10-27: qty 20

## 2023-10-27 MED ORDER — LIDOCAINE HCL 1 % IJ SOLN
INTRAMUSCULAR | Status: AC
  Filled 2023-10-27: qty 20

## 2023-10-27 MED ORDER — MIDAZOLAM HCL 2 MG/2ML IJ SOLN
INTRAMUSCULAR | Status: AC | PRN
  Administered 2023-10-27 (×5): .5 mg via INTRAVENOUS

## 2023-10-27 MED ORDER — IOHEXOL 300 MG/ML  SOLN
50.0000 mL | Freq: Once | INTRAMUSCULAR | Status: AC | PRN
Start: 2023-10-27 — End: 2023-10-27
  Administered 2023-10-27: 25 mL via INTRA_ARTERIAL

## 2023-10-27 MED ORDER — VERAPAMIL HCL 2.5 MG/ML IV SOLN
INTRAVENOUS | Status: AC
  Filled 2023-10-27: qty 2

## 2023-10-27 MED ORDER — FENTANYL CITRATE (PF) 100 MCG/2ML IJ SOLN
INTRAMUSCULAR | Status: AC | PRN
  Administered 2023-10-27: 50 ug via INTRAVENOUS
  Administered 2023-10-27 (×2): 25 ug via INTRAVENOUS

## 2023-10-27 MED ORDER — MIDAZOLAM HCL 2 MG/2ML IJ SOLN
INTRAMUSCULAR | Status: AC
  Filled 2023-10-27: qty 2

## 2023-10-27 MED ORDER — HEPARIN SODIUM (PORCINE) 1000 UNIT/ML IJ SOLN
INTRAMUSCULAR | Status: AC
  Filled 2023-10-27: qty 10

## 2023-10-27 NOTE — Plan of Care (Signed)
Kyle Matthews was with IR.  I do not have any recommended changes for him today.  Can continue to assess his volume status.  Cardiology will see him in the morning.

## 2023-10-27 NOTE — Procedures (Signed)
Interventional Radiology Procedure Note  Procedure:   US guided access right CFA.  Pelvic angiogram.  Super selective catheter into the left prostate artery. Embolization with beads and coils to stasis. Attempt at super selective right PA access failed.  CELT for hemostasis   Complications: None  Recommendations:  - Right hip straight x 1 hr during sedation recovery - CELT deployed with indication for immediate ambulation.  Bedrest per primary order.  - Routine wound care - Can advance diet per primary order. - If embo of the right is needed due to on going losses, another attempt can be made  Signed,  Yvone Neu. Loreta Ave, DO

## 2023-10-27 NOTE — Plan of Care (Signed)

## 2023-10-27 NOTE — Progress Notes (Addendum)
[facility]  - [place] [facility]   PATIENT NAME: Kyle Matthews    MR#:  [id number]  DATE OF BIRTH:  [birth year]  SUBJECTIVE:   Laying comfortably in bed. No sob. Mild anxiety S/p 4 unit BT.  Ongoing CBI with reddish urine   VITALS:  Blood pressure (!) 113/54, pulse 67, temperature 98.1 F (36.7 C), temperature source Oral, resp. rate 18, height 5\' 3"  (1.6 m), weight 70.6 kg, SpO2 96%.  PHYSICAL EXAMINATION:   GENERAL:  77 y.o.-year-old patient with no acute distress. weak LUNGS: Normal breath sounds bilaterally, no wheezing CARDIOVASCULAR: S1, S2 normal. No murmur   ABDOMEN: Soft, nontender, nondistended. CBI + EXTREMITIES: +  edema b/l.    NEUROLOGIC: nonfocal  patient is alert and awake  LABORATORY PANEL:  CBC Recent Labs  Lab 10/27/23 0357  WBC 4.8  HGB 8.4*  HCT 25.9*  PLT 180    Chemistries  Recent Labs  Lab 10/25/23 0401 10/26/23 0341 10/27/23 0357  NA 135   < > 138  K 3.6   < > 3.8  CL 103   < > 104  CO2 26   < > 27  GLUCOSE 103*   < > 102*  BUN 29*   < > 25*  CREATININE 1.03   < > 1.04  CALCIUM 7.8*   < > 8.1*  MG 2.2   < > 2.3  AST 12*  --   --   ALT 13  --   --   ALKPHOS 39  --   --   BILITOT 0.7  --   --    < > = values in this interval not displayed.    Assessment and Plan  Jasaan Thigpen is a 77 y.o. male with medical history significant for severe aortic stenosis, BPH with recurrent gross hematuria, chronic blood loss anemia associated baseline hemoglobin 7.5-10, who is admitted to [facility] on 10/23/2023 with NSTEMI after presenting from home to [facility] ED complaining of chest pain.  He was admitted to the hospital about 2 weeks ago for chest pain and hematuria.  Eventual plan was to challenge him with DAPT for couple of weeks and if tolerates, proceed with TAVR/PCI.  Seen by urology but now developed hematuria again.  IR consulted for prostate artery embolization.      Acute Blood Loss Anemia secondary to hematuria BPH  with chronic indwelling Foley catheter --Unfortunately patient has been having recurrent hematuria.   --Urology planning ADT for now.  -- Status post multiple units of PRBC transfusion--4 units so far -- Additional units as necessary.  -- Followed by urology--continue CBI --IR consulted as patient is currently getting CBI and will require prostate artery embolization--scheduled for today --empiric IV rocephin   Severe aortic stenosis Acute on chronic congestive heart failure with preserved EF CAD --Recently patient was placed on DAPT challenge but did not tolerate therefore discontinued due to hematuria.   --Considering near future CABG/TAVR evaluation.   --[facility] Cardiology following, Lasix if blood pressure tolerates  Acute Hypoxic Respiratory Failure Shortness of Breath --Respirations much better today.   --Lasix had to be held yesterday evening due to soft blood pressures.  -- Dose reduced today   Acute Kidney Injury Resolved.  Peaked at 1.3   Hypertension --IV as needed   GERD --PPI       DVT prophylaxis: SCD Code Status: full Family Communication: pt's wife has been updated by husband Consults: Cardiology Urology Level of care: Progressive Status  is: Inpatient Remains inpatient appropriate because: Hematuria, IR to do PA embolisation    TOTAL TIME TAKING CARE OF THIS PATIENT: 35 minutes.  >50% time spent on counselling and coordination of care  Note: This dictation was prepared with Dragon dictation along with smaller phrase technology. Any transcriptional errors that result from this process are unintentional.  Enedina Finner M.D    [facility]   CC: Primary care physician; Mila Palmer, MD

## 2023-10-28 DIAGNOSIS — L8990 Pressure ulcer of unspecified site, unspecified stage: Secondary | ICD-10-CM | POA: Insufficient documentation

## 2023-10-28 DIAGNOSIS — I5031 Acute diastolic (congestive) heart failure: Secondary | ICD-10-CM

## 2023-10-28 DIAGNOSIS — R310 Gross hematuria: Secondary | ICD-10-CM

## 2023-10-28 DIAGNOSIS — I2510 Atherosclerotic heart disease of native coronary artery without angina pectoris: Secondary | ICD-10-CM

## 2023-10-28 DIAGNOSIS — I214 Non-ST elevation (NSTEMI) myocardial infarction: Secondary | ICD-10-CM | POA: Diagnosis not present

## 2023-10-28 DIAGNOSIS — I350 Nonrheumatic aortic (valve) stenosis: Secondary | ICD-10-CM | POA: Diagnosis not present

## 2023-10-28 DIAGNOSIS — D649 Anemia, unspecified: Secondary | ICD-10-CM | POA: Diagnosis not present

## 2023-10-28 LAB — BASIC METABOLIC PANEL
Anion gap: 7 (ref 5–15)
BUN: 18 mg/dL (ref 8–23)
CO2: 27 mmol/L (ref 22–32)
Calcium: 8.3 mg/dL — ABNORMAL LOW (ref 8.9–10.3)
Chloride: 102 mmol/L (ref 98–111)
Creatinine, Ser: 0.85 mg/dL (ref 0.61–1.24)
GFR, Estimated: >60 mL/min (ref >60)
Glucose, Bld: 111 mg/dL — ABNORMAL HIGH (ref 70–99)
Potassium: 3.9 mmol/L (ref 3.5–5.1)
Sodium: 136 mmol/L (ref 135–145)

## 2023-10-28 LAB — CBC
HCT: 26.9 % — ABNORMAL LOW (ref 39.0–52.0)
Hemoglobin: 8.6 g/dL — ABNORMAL LOW (ref 13.0–17.0)
MCH: 28.8 pg (ref 26.0–34.0)
MCHC: 32 g/dL (ref 30.0–36.0)
MCV: 90 fL (ref 80.0–100.0)
Platelets: 193 10*3/uL (ref 150–400)
RBC: 2.99 MIL/uL — ABNORMAL LOW (ref 4.22–5.81)
RDW: 14.7 % (ref 11.5–15.5)
WBC: 7 10*3/uL (ref 4.0–10.5)
nRBC: 0 % (ref 0.0–0.2)

## 2023-10-28 LAB — MAGNESIUM: Magnesium: 2 mg/dL (ref 1.7–2.4)

## 2023-10-28 MED ORDER — SODIUM CHLORIDE 0.9 % IR SOLN
3000.0000 mL | Status: DC
  Administered 2023-10-28 – 2023-10-30 (×10): 3000 mL

## 2023-10-28 NOTE — Progress Notes (Signed)
Rounding Note    Patient Name: Kyle Matthews Date of Encounter: 10/28/2023  [facility] Cardiologist: Maisie Fus, MD   Subjective  Interval history: Resting in bed comfortably. Discomfort yesterday night secondary to bladder spasm  Less hematuria, per patient No chest pain or Shortness of breath Ambulating in the room on RA   Inpatient Medications    Scheduled Meds:  sodium chloride   Intravenous Once   Chlorhexidine Gluconate Cloth  6 each Topical Daily   finasteride  5 mg Oral QPM   furosemide  20 mg Intravenous Daily   pantoprazole  40 mg Oral Daily   rosuvastatin  40 mg Oral Daily   Continuous Infusions:   PRN Meds: acetaminophen **OR** acetaminophen, hydrALAZINE, hyoscyamine, ipratropium-albuterol, melatonin, metoprolol tartrate, morphine injection, ondansetron (ZOFRAN) IV, oxyCODONE, senna-docusate   Vital Signs    Vitals:   10/27/23 2349 10/28/23 0611 10/28/23 0625 10/28/23 0751  BP: (!) 145/68 131/67  (!) 119/56  Pulse: 81 78  73  Resp: 20 19  16   Temp: 98.6 F (37 C) 98.4 F (36.9 C)  98.4 F (36.9 C)  TempSrc: Oral Oral  Oral  SpO2: 96% 95%    Weight:   67.4 kg   Height:        Intake/Output Summary (Last 24 hours) at 10/28/2023 1128 Last data filed at 10/28/2023 0912 Gross per 24 hour  Intake 6716.67 ml  Output 16109 ml  Net -3928.33 ml      10/28/2023    6:25 AM 10/26/2023    3:02 AM 10/25/2023    7:28 AM  Last 3 Weights  Weight (lbs) 148 lb 9.4 oz 155 lb 10.3 oz 155 lb 6.8 oz  Weight (kg) 67.4 kg 70.6 kg 70.5 kg      Telemetry    Sinus rhythm - Personally Reviewed  ECG  No new  Initial Sinus rhythm with diffuse ST depression and mild aVR elevation- Personally Reviewed  Physical Exam   Vitals:   10/28/23 0611 10/28/23 0751  BP: 131/67 (!) 119/56  Pulse: 78 73  Resp: 19 16  Temp: 98.4 F (36.9 C) 98.4 F (36.9 C)  SpO2: 95%     GEN:  Hemodynamically stable, no acute distress Neck: No JVP. Cardiac: RRR,  SEM RUSB that radiates to the apex Respiratory: nl wob, mild decreased breath sounds in the bases likely due to decreased effort GI: Soft, nontender, nondistended. GU: Continuous bladder irrigation MS: No significant lower extremity, warm to touch Neuro:  Nonfocal  Psych: Normal affect   Labs    High Sensitivity Troponin:   Recent Labs  Lab 10/03/23 1521 10/04/23 0505 10/23/23 0129 10/23/23 0740 10/23/23 1700  TROPONINIHS 4,814* 3,357* 369* 482* 1,234*     Chemistry Recent Labs  Lab 10/23/23 0800 10/24/23 0556 10/25/23 0401 10/26/23 0341 10/27/23 0357 10/28/23 0242  NA 136 135 135 137 138 136  K 4.1 4.5 3.6 4.0 3.8 3.9  CL 105 102 103 104 104 102  CO2 21* 25 26 27 27 27   GLUCOSE 106* 110* 103* 98 102* 111*  BUN 18 27* 29* 28* 25* 18  CREATININE 0.90 1.36* 1.03 0.98 1.04 0.85  CALCIUM 8.4* 8.3* 7.8* 7.6* 8.1* 8.3*  MG 1.7 1.9 2.2 2.2 2.3 2.0  PROT 5.1* 5.4* 4.6*  --   --   --   ALBUMIN 2.7* 2.6* 2.1*  --   --   --   AST 19 20 12*  --   --   --  ALT 18 17 13   --   --   --   ALKPHOS 37* 40 39  --   --   --   BILITOT 1.2* 0.7 0.7  --   --   --   GFRNONAA >60 54* >60 >60 >60 >60  ANIONGAP 10 8 6 6 7 7     Lipids  Recent Labs  Lab 10/23/23 0129  CHOL 122  TRIG 74  HDL 52  LDLCALC 55  CHOLHDL 2.3    Hematology Recent Labs  Lab 10/26/23 0341 10/27/23 0357 10/28/23 0242  WBC 6.8 4.8 7.0  RBC 2.72* 2.87* 2.99*  HGB 7.8* 8.4* 8.6*  HCT 24.1* 25.9* 26.9*  MCV 88.6 90.2 90.0  MCH 28.7 29.3 28.8  MCHC 32.4 32.4 32.0  RDW 15.3 14.9 14.7  PLT 181 180 193   Thyroid No results for input(s): "TSH", "FREET4" in the last 168 hours.  BNPNo results for input(s): "BNP", "PROBNP" in the last 168 hours.  DDimer No results for input(s): "DDIMER" in the last 168 hours.   Radiology    IR EMBO ARTERIAL NOT HEMORR HEMANG INC GUIDE ROADMAPPING  Result Date: 10/27/2023 INDICATION: 77 year old male referred for prostate artery embolization for hematuria EXAM:  ULTRASOUND-GUIDED ACCESS RIGHT COMMON FEMORAL ARTERY PELVIC ANGIOGRAM SUPER SELECTIVE CATHETER PLACEMENT INTO THE LEFT PROSTATIC ARTERY, LEFT INFERIOR EPIGASTRIC ARTERY, RIGHT INTERNAL ILIAC ARTERY DIVISION BRANCHES INCLUDING ANTERIOR DIVISION, INFERIOR GLUTEAL ARTERY EMBOLIZATION WITH CALIBRATED BEADS AND COILS OF LEFT PROSTATIC ARTERY CELT FOR HEMOSTASIS MEDICATIONS: 2 g Rocephin. The antibiotic was administered within 1 hour of the procedure ANESTHESIA/SEDATION: Moderate (conscious) sedation was employed during this procedure. A total of Versed 2.5 mg and Fentanyl 100 mcg was administered intravenously by the radiology nurse. Total intra-service moderate Sedation Time: 143 minutes. The patient's level of consciousness and vital signs were monitored continuously by radiology nursing throughout the procedure under my direct supervision. CONTRAST:  100 cc FLUOROSCOPY: Radiation Exposure Index (as provided by the fluoroscopic device): 3,613 mGy Kerma COMPLICATIONS: None PROCEDURE: Informed consent was obtained from the patient following explanation of the procedure, risks, benefits and alternatives. The patient understands, agrees and consents for the procedure. All questions were addressed. A time out was performed prior to the initiation of the procedure. Maximal barrier sterile technique utilized including caps, mask, sterile gowns, sterile gloves, large sterile drape, hand hygiene, and Betadine prep. Ultrasound survey of the right inguinal region was performed with images stored and sent to PACs, confirming patency of the right common femoral artery. A micropuncture needle was used access the right common femoral artery under ultrasound. With excellent arterial blood flow returned, and an .018 micro wire was passed through the needle, observed enter the abdominal aorta under fluoroscopy. The needle was removed, and a micropuncture sheath was placed over the wire. The inner dilator and wire were removed, and an  035 Bentson wire was advanced under fluoroscopy into the abdominal aorta. The sheath was removed and a standard 5 [place] vascular sheath was placed. The dilator was removed and the sheath was flushed. Omni Flush catheter was advanced on the Bentson wire into the abdominal aorta. Wire was removed and angiogram was performed. Omni Flush catheter was used direct a Bentson wire into the left iliac system. Omni Flush catheter was removed. C2 cobra catheter was advanced on the Bentson wire. Angiogram was performed. Given the laterally directed position of the Cobra catheter we elected to exchange for a Kumpe the catheter. The exchange was made on a Bentson wire. The  Bentson wire was directed towards the inferior epigastric artery. Once the tip of the catheter was directed medially towards the artery and angiogram confirmed the location, STC Renegade catheter was advanced on an 014 fathom wire into the inferior epigastric artery, and then used to select the replaced prostatic artery from the replaced operator artery. Once we positioned the microcatheter beyond the ostium of the operator artery, angiogram was performed. We then performed calibrated bead embolization with Hydrospheres . Ten beat stasis was achieved. Coil embolization was then performed with 2 interlock coils. Final angiogram was performed. The Kumpe the catheter was then exchanged for a C2 Cobra catheter. Waltman loop was formed with the Bentson wire and then used to select the ipsilateral iliac system. Catheter was position into the ostium of the internal iliac artery. Angiogram was performed. Angiogram was performed in multiple obliquities over time. The majority of the fluoroscopy time was on this right side, and we used multiple obliquities and multiple catheter and wire combinations in order to attempt selecting the right prostate artery. This included the [facility] Renegade catheter, straight tip, angled tip, double angle 018 glide wire, 014 synchro  soft, 014 fathom wire. Ultimately it seems that the atherosclerotic plaque at the origin of the takeoff was obstructing from this angle approach. We ultimately withdrew at 50 minutes of fluoro time. All catheters wires were withdrawn. Celt was deployed for hemostasis. Patient tolerated the procedure well and remained hemodynamically stable throughout. No complications were encountered and no significant blood loss. IMPRESSION: Status post ultrasound guided access right common femoral artery for pelvic angiogram, combination calibrated bead and coil embolization of the left prostatic artery to stasis, and failed right prostatic artery embolization. Celt for hemostasis Signed, Yvone Neu. Miachel Roux, RPVI [facility] Radiology Electronically Signed   By: Gilmer Mor D.O.   On: 10/27/2023 13:54   IR US Guide Vasc Access Right  Result Date: 10/27/2023 INDICATION: 77 year old male referred for prostate artery embolization for hematuria EXAM: ULTRASOUND-GUIDED ACCESS RIGHT COMMON FEMORAL ARTERY PELVIC ANGIOGRAM SUPER SELECTIVE CATHETER PLACEMENT INTO THE LEFT PROSTATIC ARTERY, LEFT INFERIOR EPIGASTRIC ARTERY, RIGHT INTERNAL ILIAC ARTERY DIVISION BRANCHES INCLUDING ANTERIOR DIVISION, INFERIOR GLUTEAL ARTERY EMBOLIZATION WITH CALIBRATED BEADS AND COILS OF LEFT PROSTATIC ARTERY CELT FOR HEMOSTASIS MEDICATIONS: 2 g Rocephin. The antibiotic was administered within 1 hour of the procedure ANESTHESIA/SEDATION: Moderate (conscious) sedation was employed during this procedure. A total of Versed 2.5 mg and Fentanyl 100 mcg was administered intravenously by the radiology nurse. Total intra-service moderate Sedation Time: 143 minutes. The patient's level of consciousness and vital signs were monitored continuously by radiology nursing throughout the procedure under my direct supervision. CONTRAST:  100 cc FLUOROSCOPY: Radiation Exposure Index (as provided by the fluoroscopic  device): 3,613 mGy Kerma COMPLICATIONS: None PROCEDURE: Informed consent was obtained from the patient following explanation of the procedure, risks, benefits and alternatives. The patient understands, agrees and consents for the procedure. All questions were addressed. A time out was performed prior to the initiation of the procedure. Maximal barrier sterile technique utilized including caps, mask, sterile gowns, sterile gloves, large sterile drape, hand hygiene, and Betadine prep. Ultrasound survey of the right inguinal region was performed with images stored and sent to PACs, confirming patency of the right common femoral artery. A micropuncture needle was used access the right common femoral artery under ultrasound. With excellent arterial blood flow returned, and an .018 micro wire was passed through the needle, observed enter the abdominal aorta under fluoroscopy.  The needle was removed, and a micropuncture sheath was placed over the wire. The inner dilator and wire were removed, and an 035 Bentson wire was advanced under fluoroscopy into the abdominal aorta. The sheath was removed and a standard 5 [place] vascular sheath was placed. The dilator was removed and the sheath was flushed. Omni Flush catheter was advanced on the Bentson wire into the abdominal aorta. Wire was removed and angiogram was performed. Omni Flush catheter was used direct a Bentson wire into the left iliac system. Omni Flush catheter was removed. C2 cobra catheter was advanced on the Bentson wire. Angiogram was performed. Given the laterally directed position of the Cobra catheter we elected to exchange for a Kumpe the catheter. The exchange was made on a Bentson wire. The Bentson wire was directed towards the inferior epigastric artery. Once the tip of the catheter was directed medially towards the artery and angiogram confirmed the location, STC Renegade catheter was advanced on an 014 fathom wire into the inferior epigastric artery, and  then used to select the replaced prostatic artery from the replaced operator artery. Once we positioned the microcatheter beyond the ostium of the operator artery, angiogram was performed. We then performed calibrated bead embolization with Hydrospheres . Ten beat stasis was achieved. Coil embolization was then performed with 2 interlock coils. Final angiogram was performed. The Kumpe the catheter was then exchanged for a C2 Cobra catheter. Waltman loop was formed with the Bentson wire and then used to select the ipsilateral iliac system. Catheter was position into the ostium of the internal iliac artery. Angiogram was performed. Angiogram was performed in multiple obliquities over time. The majority of the fluoroscopy time was on this right side, and we used multiple obliquities and multiple catheter and wire combinations in order to attempt selecting the right prostate artery. This included the [facility] Renegade catheter, straight tip, angled tip, double angle 018 glide wire, 014 synchro soft, 014 fathom wire. Ultimately it seems that the atherosclerotic plaque at the origin of the takeoff was obstructing from this angle approach. We ultimately withdrew at 50 minutes of fluoro time. All catheters wires were withdrawn. Celt was deployed for hemostasis. Patient tolerated the procedure well and remained hemodynamically stable throughout. No complications were encountered and no significant blood loss. IMPRESSION: Status post ultrasound guided access right common femoral artery for pelvic angiogram, combination calibrated bead and coil embolization of the left prostatic artery to stasis, and failed right prostatic artery embolization. Celt for hemostasis Signed, Yvone Neu. Miachel Roux, RPVI [facility] Radiology Electronically Signed   By: Gilmer Mor D.O.   On: 10/27/2023 13:54   IR Angiogram Pelvis Selective Or Supraselective  Result Date:  10/27/2023 INDICATION: 77 year old male referred for prostate artery embolization for hematuria EXAM: ULTRASOUND-GUIDED ACCESS RIGHT COMMON FEMORAL ARTERY PELVIC ANGIOGRAM SUPER SELECTIVE CATHETER PLACEMENT INTO THE LEFT PROSTATIC ARTERY, LEFT INFERIOR EPIGASTRIC ARTERY, RIGHT INTERNAL ILIAC ARTERY DIVISION BRANCHES INCLUDING ANTERIOR DIVISION, INFERIOR GLUTEAL ARTERY EMBOLIZATION WITH CALIBRATED BEADS AND COILS OF LEFT PROSTATIC ARTERY CELT FOR HEMOSTASIS MEDICATIONS: 2 g Rocephin. The antibiotic was administered within 1 hour of the procedure ANESTHESIA/SEDATION: Moderate (conscious) sedation was employed during this procedure. A total of Versed 2.5 mg and Fentanyl 100 mcg was administered intravenously by the radiology nurse. Total intra-service moderate Sedation Time: 143 minutes. The patient's level of consciousness and vital signs were monitored continuously by radiology nursing throughout the procedure under my direct supervision. CONTRAST:  100 cc FLUOROSCOPY: Radiation Exposure Index (  as provided by the fluoroscopic device): 3,613 mGy Kerma COMPLICATIONS: None PROCEDURE: Informed consent was obtained from the patient following explanation of the procedure, risks, benefits and alternatives. The patient understands, agrees and consents for the procedure. All questions were addressed. A time out was performed prior to the initiation of the procedure. Maximal barrier sterile technique utilized including caps, mask, sterile gowns, sterile gloves, large sterile drape, hand hygiene, and Betadine prep. Ultrasound survey of the right inguinal region was performed with images stored and sent to PACs, confirming patency of the right common femoral artery. A micropuncture needle was used access the right common femoral artery under ultrasound. With excellent arterial blood flow returned, and an .018 micro wire was passed through the needle, observed enter the abdominal aorta under fluoroscopy. The needle was removed,  and a micropuncture sheath was placed over the wire. The inner dilator and wire were removed, and an 035 Bentson wire was advanced under fluoroscopy into the abdominal aorta. The sheath was removed and a standard 5 [place] vascular sheath was placed. The dilator was removed and the sheath was flushed. Omni Flush catheter was advanced on the Bentson wire into the abdominal aorta. Wire was removed and angiogram was performed. Omni Flush catheter was used direct a Bentson wire into the left iliac system. Omni Flush catheter was removed. C2 cobra catheter was advanced on the Bentson wire. Angiogram was performed. Given the laterally directed position of the Cobra catheter we elected to exchange for a Kumpe the catheter. The exchange was made on a Bentson wire. The Bentson wire was directed towards the inferior epigastric artery. Once the tip of the catheter was directed medially towards the artery and angiogram confirmed the location, STC Renegade catheter was advanced on an 014 fathom wire into the inferior epigastric artery, and then used to select the replaced prostatic artery from the replaced operator artery. Once we positioned the microcatheter beyond the ostium of the operator artery, angiogram was performed. We then performed calibrated bead embolization with Hydrospheres . Ten beat stasis was achieved. Coil embolization was then performed with 2 interlock coils. Final angiogram was performed. The Kumpe the catheter was then exchanged for a C2 Cobra catheter. Waltman loop was formed with the Bentson wire and then used to select the ipsilateral iliac system. Catheter was position into the ostium of the internal iliac artery. Angiogram was performed. Angiogram was performed in multiple obliquities over time. The majority of the fluoroscopy time was on this right side, and we used multiple obliquities and multiple catheter and wire combinations in order to attempt selecting the right prostate artery. This  included the [facility] Renegade catheter, straight tip, angled tip, double angle 018 glide wire, 014 synchro soft, 014 fathom wire. Ultimately it seems that the atherosclerotic plaque at the origin of the takeoff was obstructing from this angle approach. We ultimately withdrew at 50 minutes of fluoro time. All catheters wires were withdrawn. Celt was deployed for hemostasis. Patient tolerated the procedure well and remained hemodynamically stable throughout. No complications were encountered and no significant blood loss. IMPRESSION: Status post ultrasound guided access right common femoral artery for pelvic angiogram, combination calibrated bead and coil embolization of the left prostatic artery to stasis, and failed right prostatic artery embolization. Celt for hemostasis Signed, Yvone Neu. Miachel Roux, RPVI [facility]iology [facility] Radiology Electronically Signed   By: Gilmer Mor D.O.   On: 10/27/2023 13:54   IR Angiogram Pelvis Selective Or Supraselective  Result Date: 10/27/2023  INDICATION: 77 year old male referred for prostate artery embolization for hematuria EXAM: ULTRASOUND-GUIDED ACCESS RIGHT COMMON FEMORAL ARTERY PELVIC ANGIOGRAM SUPER SELECTIVE CATHETER PLACEMENT INTO THE LEFT PROSTATIC ARTERY, LEFT INFERIOR EPIGASTRIC ARTERY, RIGHT INTERNAL ILIAC ARTERY DIVISION BRANCHES INCLUDING ANTERIOR DIVISION, INFERIOR GLUTEAL ARTERY EMBOLIZATION WITH CALIBRATED BEADS AND COILS OF LEFT PROSTATIC ARTERY CELT FOR HEMOSTASIS MEDICATIONS: 2 g Rocephin. The antibiotic was administered within 1 hour of the procedure ANESTHESIA/SEDATION: Moderate (conscious) sedation was employed during this procedure. A total of Versed 2.5 mg and Fentanyl 100 mcg was administered intravenously by the radiology nurse. Total intra-service moderate Sedation Time: 143 minutes. The patient's level of consciousness and vital signs were monitored continuously by radiology nursing throughout the  procedure under my direct supervision. CONTRAST:  100 cc FLUOROSCOPY: Radiation Exposure Index (as provided by the fluoroscopic device): 3,613 mGy Kerma COMPLICATIONS: None PROCEDURE: Informed consent was obtained from the patient following explanation of the procedure, risks, benefits and alternatives. The patient understands, agrees and consents for the procedure. All questions were addressed. A time out was performed prior to the initiation of the procedure. Maximal barrier sterile technique utilized including caps, mask, sterile gowns, sterile gloves, large sterile drape, hand hygiene, and Betadine prep. Ultrasound survey of the right inguinal region was performed with images stored and sent to PACs, confirming patency of the right common femoral artery. A micropuncture needle was used access the right common femoral artery under ultrasound. With excellent arterial blood flow returned, and an .018 micro wire was passed through the needle, observed enter the abdominal aorta under fluoroscopy. The needle was removed, and a micropuncture sheath was placed over the wire. The inner dilator and wire were removed, and an 035 Bentson wire was advanced under fluoroscopy into the abdominal aorta. The sheath was removed and a standard 5 [place] vascular sheath was placed. The dilator was removed and the sheath was flushed. Omni Flush catheter was advanced on the Bentson wire into the abdominal aorta. Wire was removed and angiogram was performed. Omni Flush catheter was used direct a Bentson wire into the left iliac system. Omni Flush catheter was removed. C2 cobra catheter was advanced on the Bentson wire. Angiogram was performed. Given the laterally directed position of the Cobra catheter we elected to exchange for a Kumpe the catheter. The exchange was made on a Bentson wire. The Bentson wire was directed towards the inferior epigastric artery. Once the tip of the catheter was directed medially towards the artery and  angiogram confirmed the location, STC Renegade catheter was advanced on an 014 fathom wire into the inferior epigastric artery, and then used to select the replaced prostatic artery from the replaced operator artery. Once we positioned the microcatheter beyond the ostium of the operator artery, angiogram was performed. We then performed calibrated bead embolization with Hydrospheres . Ten beat stasis was achieved. Coil embolization was then performed with 2 interlock coils. Final angiogram was performed. The Kumpe the catheter was then exchanged for a C2 Cobra catheter. Waltman loop was formed with the Bentson wire and then used to select the ipsilateral iliac system. Catheter was position into the ostium of the internal iliac artery. Angiogram was performed. Angiogram was performed in multiple obliquities over time. The majority of the fluoroscopy time was on this right side, and we used multiple obliquities and multiple catheter and wire combinations in order to attempt selecting the right prostate artery. This included the [facility] Renegade catheter, straight tip, angled tip, double angle 018 glide wire, 014 synchro soft, 014 fathom  wire. Ultimately it seems that the atherosclerotic plaque at the origin of the takeoff was obstructing from this angle approach. We ultimately withdrew at 50 minutes of fluoro time. All catheters wires were withdrawn. Celt was deployed for hemostasis. Patient tolerated the procedure well and remained hemodynamically stable throughout. No complications were encountered and no significant blood loss. IMPRESSION: Status post ultrasound guided access right common femoral artery for pelvic angiogram, combination calibrated bead and coil embolization of the left prostatic artery to stasis, and failed right prostatic artery embolization. Celt for hemostasis Signed, Yvone Neu. Miachel Roux, RPVI [facility] Radiology [facility] Radiology Electronically Signed    By: Gilmer Mor D.O.   On: 10/27/2023 13:54   IR Angiogram Selective Each Additional Vessel  Result Date: 10/27/2023 INDICATION: 77 year old male referred for prostate artery embolization for hematuria EXAM: ULTRASOUND-GUIDED ACCESS RIGHT COMMON FEMORAL ARTERY PELVIC ANGIOGRAM SUPER SELECTIVE CATHETER PLACEMENT INTO THE LEFT PROSTATIC ARTERY, LEFT INFERIOR EPIGASTRIC ARTERY, RIGHT INTERNAL ILIAC ARTERY DIVISION BRANCHES INCLUDING ANTERIOR DIVISION, INFERIOR GLUTEAL ARTERY EMBOLIZATION WITH CALIBRATED BEADS AND COILS OF LEFT PROSTATIC ARTERY CELT FOR HEMOSTASIS MEDICATIONS: 2 g Rocephin. The antibiotic was administered within 1 hour of the procedure ANESTHESIA/SEDATION: Moderate (conscious) sedation was employed during this procedure. A total of Versed 2.5 mg and Fentanyl 100 mcg was administered intravenously by the radiology nurse. Total intra-service moderate Sedation Time: 143 minutes. The patient's level of consciousness and vital signs were monitored continuously by radiology nursing throughout the procedure under my direct supervision. CONTRAST:  100 cc FLUOROSCOPY: Radiation Exposure Index (as provided by the fluoroscopic device): 3,613 mGy Kerma COMPLICATIONS: None PROCEDURE: Informed consent was obtained from the patient following explanation of the procedure, risks, benefits and alternatives. The patient understands, agrees and consents for the procedure. All questions were addressed. A time out was performed prior to the initiation of the procedure. Maximal barrier sterile technique utilized including caps, mask, sterile gowns, sterile gloves, large sterile drape, hand hygiene, and Betadine prep. Ultrasound survey of the right inguinal region was performed with images stored and sent to PACs, confirming patency of the right common femoral artery. A micropuncture needle was used access the right common femoral artery under ultrasound. With excellent arterial blood flow returned, and an .018 micro  wire was passed through the needle, observed enter the abdominal aorta under fluoroscopy. The needle was removed, and a micropuncture sheath was placed over the wire. The inner dilator and wire were removed, and an 035 Bentson wire was advanced under fluoroscopy into the abdominal aorta. The sheath was removed and a standard 5 [place] vascular sheath was placed. The dilator was removed and the sheath was flushed. Omni Flush catheter was advanced on the Bentson wire into the abdominal aorta. Wire was removed and angiogram was performed. Omni Flush catheter was used direct a Bentson wire into the left iliac system. Omni Flush catheter was removed. C2 cobra catheter was advanced on the Bentson wire. Angiogram was performed. Given the laterally directed position of the Cobra catheter we elected to exchange for a Kumpe the catheter. The exchange was made on a Bentson wire. The Bentson wire was directed towards the inferior epigastric artery. Once the tip of the catheter was directed medially towards the artery and angiogram confirmed the location, STC Renegade catheter was advanced on an 014 fathom wire into the inferior epigastric artery, and then used to select the replaced prostatic artery from the replaced operator artery. Once we positioned the microcatheter beyond the ostium of the operator  artery, angiogram was performed. We then performed calibrated bead embolization with Hydrospheres . Ten beat stasis was achieved. Coil embolization was then performed with 2 interlock coils. Final angiogram was performed. The Kumpe the catheter was then exchanged for a C2 Cobra catheter. Waltman loop was formed with the Bentson wire and then used to select the ipsilateral iliac system. Catheter was position into the ostium of the internal iliac artery. Angiogram was performed. Angiogram was performed in multiple obliquities over time. The majority of the fluoroscopy time was on this right side, and we used multiple  obliquities and multiple catheter and wire combinations in order to attempt selecting the right prostate artery. This included the [facility] Renegade catheter, straight tip, angled tip, double angle 018 glide wire, 014 synchro soft, 014 fathom wire. Ultimately it seems that the atherosclerotic plaque at the origin of the takeoff was obstructing from this angle approach. We ultimately withdrew at 50 minutes of fluoro time. All catheters wires were withdrawn. Celt was deployed for hemostasis. Patient tolerated the procedure well and remained hemodynamically stable throughout. No complications were encountered and no significant blood loss. IMPRESSION: Status post ultrasound guided access right common femoral artery for pelvic angiogram, combination calibrated bead and coil embolization of the left prostatic artery to stasis, and failed right prostatic artery embolization. Celt for hemostasis Signed, Yvone Neu. Miachel Roux, RPVI [facility] Radiology Electronically Signed   By: Gilmer Mor D.O.   On: 10/27/2023 13:54   IR Angiogram Selective Each Additional Vessel  Result Date: 10/27/2023 INDICATION: 77 year old male referred for prostate artery embolization for hematuria EXAM: ULTRASOUND-GUIDED ACCESS RIGHT COMMON FEMORAL ARTERY PELVIC ANGIOGRAM SUPER SELECTIVE CATHETER PLACEMENT INTO THE LEFT PROSTATIC ARTERY, LEFT INFERIOR EPIGASTRIC ARTERY, RIGHT INTERNAL ILIAC ARTERY DIVISION BRANCHES INCLUDING ANTERIOR DIVISION, INFERIOR GLUTEAL ARTERY EMBOLIZATION WITH CALIBRATED BEADS AND COILS OF LEFT PROSTATIC ARTERY CELT FOR HEMOSTASIS MEDICATIONS: 2 g Rocephin. The antibiotic was administered within 1 hour of the procedure ANESTHESIA/SEDATION: Moderate (conscious) sedation was employed during this procedure. A total of Versed 2.5 mg and Fentanyl 100 mcg was administered intravenously by the radiology nurse. Total intra-service moderate Sedation Time: 143 minutes. The  patient's level of consciousness and vital signs were monitored continuously by radiology nursing throughout the procedure under my direct supervision. CONTRAST:  100 cc FLUOROSCOPY: Radiation Exposure Index (as provided by the fluoroscopic device): 3,613 mGy Kerma COMPLICATIONS: None PROCEDURE: Informed consent was obtained from the patient following explanation of the procedure, risks, benefits and alternatives. The patient understands, agrees and consents for the procedure. All questions were addressed. A time out was performed prior to the initiation of the procedure. Maximal barrier sterile technique utilized including caps, mask, sterile gowns, sterile gloves, large sterile drape, hand hygiene, and Betadine prep. Ultrasound survey of the right inguinal region was performed with images stored and sent to PACs, confirming patency of the right common femoral artery. A micropuncture needle was used access the right common femoral artery under ultrasound. With excellent arterial blood flow returned, and an .018 micro wire was passed through the needle, observed enter the abdominal aorta under fluoroscopy. The needle was removed, and a micropuncture sheath was placed over the wire. The inner dilator and wire were removed, and an 035 Bentson wire was advanced under fluoroscopy into the abdominal aorta. The sheath was removed and a standard 5 [place] vascular sheath was placed. The dilator was removed and the sheath was flushed. Omni Flush catheter was advanced on the Bentson wire into the  abdominal aorta. Wire was removed and angiogram was performed. Omni Flush catheter was used direct a Bentson wire into the left iliac system. Omni Flush catheter was removed. C2 cobra catheter was advanced on the Bentson wire. Angiogram was performed. Given the laterally directed position of the Cobra catheter we elected to exchange for a Kumpe the catheter. The exchange was made on a Bentson wire. The Bentson wire was directed  towards the inferior epigastric artery. Once the tip of the catheter was directed medially towards the artery and angiogram confirmed the location, STC Renegade catheter was advanced on an 014 fathom wire into the inferior epigastric artery, and then used to select the replaced prostatic artery from the replaced operator artery. Once we positioned the microcatheter beyond the ostium of the operator artery, angiogram was performed. We then performed calibrated bead embolization with Hydrospheres . Ten beat stasis was achieved. Coil embolization was then performed with 2 interlock coils. Final angiogram was performed. The Kumpe the catheter was then exchanged for a C2 Cobra catheter. Waltman loop was formed with the Bentson wire and then used to select the ipsilateral iliac system. Catheter was position into the ostium of the internal iliac artery. Angiogram was performed. Angiogram was performed in multiple obliquities over time. The majority of the fluoroscopy time was on this right side, and we used multiple obliquities and multiple catheter and wire combinations in order to attempt selecting the right prostate artery. This included the [facility] Renegade catheter, straight tip, angled tip, double angle 018 glide wire, 014 synchro soft, 014 fathom wire. Ultimately it seems that the atherosclerotic plaque at the origin of the takeoff was obstructing from this angle approach. We ultimately withdrew at 50 minutes of fluoro time. All catheters wires were withdrawn. Celt was deployed for hemostasis. Patient tolerated the procedure well and remained hemodynamically stable throughout. No complications were encountered and no significant blood loss. IMPRESSION: Status post ultrasound guided access right common femoral artery for pelvic angiogram, combination calibrated bead and coil embolization of the left prostatic artery to stasis, and failed right prostatic artery embolization. Celt for hemostasis Signed, Yvone Neu.  Miachel Roux, RPVI V[facility] and Interventional Radiology Specialists [facility] Radiology Electronically Signed   By: Gilmer Mor D.O.   On: 10/27/2023 13:54   IR Angiogram Selective Each Additional Vessel  Result Date: 10/27/2023 INDICATION: 77 year old male referred for prostate artery embolization for hematuria EXAM: ULTRASOUND-GUIDED ACCESS RIGHT COMMON FEMORAL ARTERY PELVIC ANGIOGRAM SUPER SELECTIVE CATHETER PLACEMENT INTO THE LEFT PROSTATIC ARTERY, LEFT INFERIOR EPIGASTRIC ARTERY, RIGHT INTERNAL ILIAC ARTERY DIVISION BRANCHES INCLUDING ANTERIOR DIVISION, INFERIOR GLUTEAL ARTERY EMBOLIZATION WITH CALIBRATED BEADS AND COILS OF LEFT PROSTATIC ARTERY CELT FOR HEMOSTASIS MEDICATIONS: 2 g Rocephin. The antibiotic was administered within 1 hour of the procedure ANESTHESIA/SEDATION: Moderate (conscious) sedation was employed during this procedure. A total of Versed 2.5 mg and Fentanyl 100 mcg was administered intravenously by the radiology nurse. Total intra-service moderate Sedation Time: 143 minutes. The patient's level of consciousness and vital signs were monitored continuously by radiology nursing throughout the procedure under my direct supervision. CONTRAST:  100 cc FLUOROSCOPY: Radiation Exposure Index (as provided by the fluoroscopic device): 3,613 mGy Kerma COMPLICATIONS: None PROCEDURE: Informed consent was obtained from the patient following explanation of the procedure, risks, benefits and alternatives. The patient understands, agrees and consents for the procedure. All questions were addressed. A time out was performed prior to the initiation of the procedure. Maximal barrier sterile technique utilized including caps, mask, sterile gowns, sterile gloves, large  sterile drape, hand hygiene, and Betadine prep. Ultrasound survey of the right inguinal region was performed with images stored and sent to PACs, confirming patency of the right common femoral artery. A micropuncture needle was used  access the right common femoral artery under ultrasound. With excellent arterial blood flow returned, and an .018 micro wire was passed through the needle, observed enter the abdominal aorta under fluoroscopy. The needle was removed, and a micropuncture sheath was placed over the wire. The inner dilator and wire were removed, and an 035 Bentson wire was advanced under fluoroscopy into the abdominal aorta. The sheath was removed and a standard 5 [place] vascular sheath was placed. The dilator was removed and the sheath was flushed. Omni Flush catheter was advanced on the Bentson wire into the abdominal aorta. Wire was removed and angiogram was performed. Omni Flush catheter was used direct a Bentson wire into the left iliac system. Omni Flush catheter was removed. C2 cobra catheter was advanced on the Bentson wire. Angiogram was performed. Given the laterally directed position of the Cobra catheter we elected to exchange for a Kumpe the catheter. The exchange was made on a Bentson wire. The Bentson wire was directed towards the inferior epigastric artery. Once the tip of the catheter was directed medially towards the artery and angiogram confirmed the location, STC Renegade catheter was advanced on an 014 fathom wire into the inferior epigastric artery, and then used to select the replaced prostatic artery from the replaced operator artery. Once we positioned the microcatheter beyond the ostium of the operator artery, angiogram was performed. We then performed calibrated bead embolization with Hydrospheres . Ten beat stasis was achieved. Coil embolization was then performed with 2 interlock coils. Final angiogram was performed. The Kumpe the catheter was then exchanged for a C2 Cobra catheter. Waltman loop was formed with the Bentson wire and then used to select the ipsilateral iliac system. Catheter was position into the ostium of the internal iliac artery. Angiogram was performed. Angiogram was performed in  multiple obliquities over time. The majority of the fluoroscopy time was on this right side, and we used multiple obliquities and multiple catheter and wire combinations in order to attempt selecting the right prostate artery. This included the [facility] Renegade catheter, straight tip, angled tip, double angle 018 glide wire, 014 synchro soft, 014 fathom wire. Ultimately it seems that the atherosclerotic plaque at the origin of the takeoff was obstructing from this angle approach. We ultimately withdrew at 50 minutes of fluoro time. All catheters wires were withdrawn. Celt was deployed for hemostasis. Patient tolerated the procedure well and remained hemodynamically stable throughout. No complications were encountered and no significant blood loss. IMPRESSION: Status post ultrasound guided access right common femoral artery for pelvic angiogram, combination calibrated bead and coil embolization of the left prostatic artery to stasis, and failed right prostatic artery embolization. Celt for hemostasis Signed, Yvone Neu. Miachel Roux, RPVI [facility] and Interventional Radiology Specialists [facility] Radiology Electronically Signed   By: Gilmer Mor D.O.   On: 10/27/2023 13:54   IR Angiogram Selective Each Additional Vessel  Result Date: 10/27/2023 INDICATION: 77 year old male referred for prostate artery embolization for hematuria EXAM: ULTRASOUND-GUIDED ACCESS RIGHT COMMON FEMORAL ARTERY PELVIC ANGIOGRAM SUPER SELECTIVE CATHETER PLACEMENT INTO THE LEFT PROSTATIC ARTERY, LEFT INFERIOR EPIGASTRIC ARTERY, RIGHT INTERNAL ILIAC ARTERY DIVISION BRANCHES INCLUDING ANTERIOR DIVISION, INFERIOR GLUTEAL ARTERY EMBOLIZATION WITH CALIBRATED BEADS AND COILS OF LEFT PROSTATIC ARTERY CELT FOR HEMOSTASIS MEDICATIONS: 2 g Rocephin. The antibiotic was administered within  1 hour of the procedure ANESTHESIA/SEDATION: Moderate (conscious) sedation was employed during this procedure. A total of Versed 2.5 mg and Fentanyl 100 mcg was  administered intravenously by the radiology nurse. Total intra-service moderate Sedation Time: 143 minutes. The patient's level of consciousness and vital signs were monitored continuously by radiology nursing throughout the procedure under my direct supervision. CONTRAST:  100 cc FLUOROSCOPY: Radiation Exposure Index (as provided by the fluoroscopic device): 3,613 mGy Kerma COMPLICATIONS: None PROCEDURE: Informed consent was obtained from the patient following explanation of the procedure, risks, benefits and alternatives. The patient understands, agrees and consents for the procedure. All questions were addressed. A time out was performed prior to the initiation of the procedure. Maximal barrier sterile technique utilized including caps, mask, sterile gowns, sterile gloves, large sterile drape, hand hygiene, and Betadine prep. Ultrasound survey of the right inguinal region was performed with images stored and sent to PACs, confirming patency of the right common femoral artery. A micropuncture needle was used access the right common femoral artery under ultrasound. With excellent arterial blood flow returned, and an .018 micro wire was passed through the needle, observed enter the abdominal aorta under fluoroscopy. The needle was removed, and a micropuncture sheath was placed over the wire. The inner dilator and wire were removed, and an 035 Bentson wire was advanced under fluoroscopy into the abdominal aorta. The sheath was removed and a standard 5 [place] vascular sheath was placed. The dilator was removed and the sheath was flushed. Omni Flush catheter was advanced on the Bentson wire into the abdominal aorta. Wire was removed and angiogram was performed. Omni Flush catheter was used direct a Bentson wire into the left iliac system. Omni Flush catheter was removed. C2 cobra catheter was advanced on the Bentson wire. Angiogram was performed. Given the laterally directed position of the Cobra catheter we elected to  exchange for a Kumpe the catheter. The exchange was made on a Bentson wire. The Bentson wire was directed towards the inferior epigastric artery. Once the tip of the catheter was directed medially towards the artery and angiogram confirmed the location, STC Renegade catheter was advanced on an 014 fathom wire into the inferior epigastric artery, and then used to select the replaced prostatic artery from the replaced operator artery. Once we positioned the microcatheter beyond the ostium of the operator artery, angiogram was performed. We then performed calibrated bead embolization with Hydrospheres . Ten beat stasis was achieved. Coil embolization was then performed with 2 interlock coils. Final angiogram was performed. The Kumpe the catheter was then exchanged for a C2 Cobra catheter. Waltman loop was formed with the Bentson wire and then used to select the ipsilateral iliac system. Catheter was position into the ostium of the internal iliac artery. Angiogram was performed. Angiogram was performed in multiple obliquities over time. The majority of the fluoroscopy time was on this right side, and we used multiple obliquities and multiple catheter and wire combinations in order to attempt selecting the right prostate artery. This included the [facility] Renegade catheter, straight tip, angled tip, double angle 018 glide wire, 014 synchro soft, 014 fathom wire. Ultimately it seems that the atherosclerotic plaque at the origin of the takeoff was obstructing from this angle approach. We ultimately withdrew at 50 minutes of fluoro time. All catheters wires were withdrawn. Celt was deployed for hemostasis. Patient tolerated the procedure well and remained hemodynamically stable throughout. No complications were encountered and no significant blood loss. IMPRESSION: Status post ultrasound guided access right common  femoral artery for pelvic angiogram, combination calibrated bead and coil embolization of the left  prostatic artery to stasis, and failed right prostatic artery embolization. Celt for hemostasis Signed, Yvone Neu. Miachel Roux, RPVI Vascular and Interventional Radiology Specialists [facility] Radiology Electronically Signed   By: Gilmer Mor D.O.   On: 10/27/2023 13:54    Cardiac Studies   TTE 10/03/2023 1. Left ventricular ejection fraction, by estimation, is 55 to 60%. The  left ventricle has normal function. The left ventricle has no regional  wall motion abnormalities. There is mild left ventricular hypertrophy.  Left ventricular diastolic parameters  are indeterminate. Elevated left atrial pressure.   2. Right ventricular systolic function is normal. The right ventricular size is normal. There is mildly elevated pulmonary artery systolic pressure.   3. Left atrial size was moderately dilated.   4. Right atrial size was moderately dilated.   5. The mitral valve is abnormal. Mild to moderate mitral valve regurgitation. No evidence of mitral stenosis.   6. The tricuspid valve is abnormal.   7. The aortic valve was not well visualized. There is severe calcifcation of the aortic valve. There is severe thickening of the aortic valve.  Aortic valve regurgitation is moderate to severe. Severe aortic valve stenosis. Severe aortic stenosis is  present. Aortic valve mean gradient measures 42.0 mmHg. Aortic valve peak gradient measures 69.2 mmHg. Aortic valve area, by VTI measures 0.89 cm.   8. The inferior vena cava is dilated in size with >50% respiratory variability, suggesting right atrial pressure of 8 mmHg.    LHC 10/09/2023   Ost LM lesion is 50% stenosed.   1.  Moderate left main stenosis with an RFR of 0.83. 2.  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 3.  Fick cardiac output of 6.5 L/min and Fick cardiac index of 3.8 L/min/m with the following hemodynamics:             Right atrial pressure mean of 7             Right ventricular pressure 42/-2 with an  end-diastolic pressure of 8 mmHg             Wedge pressure mean of 14 mmHg with V waves to 23 mmHg             PA pressure of 38/16 with a mean of 24 mmHg             PVR of 1.5 Woods units             PA pulsatility index of 3.1   Recommendation: Continue evaluation for aortic valve intervention.  Will obtain TAVR protocol CTA tomorrow.  Patient Profile     77 y.o. 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   Assessment & Plan    CAD- 50% LM CP- demand ischemia His prior EKG noted ST depressions with minimal elevation in aVR, know history of LM disease. Unable to tolerate dual antiplatelet therapy due to hematuria and therefore tentatively scheduled for CABG/aortic valve replacement on November 02, 2023. On presentation patient had anginal chest pain and shortness of breath likely secondary to demand ischemia as hemoglobin was 5.4. Status post blood transfusion and status post left prostate artery embolization with IR on 10/27/2023. Morning hemoglobin remains a stable.  And clinically denies anginal chest pain. Antiplatelets and AC are on hold secondary to hematuria Continue Crestor 40 mg daily Monitor for now.  HFpEF Clinically euvolemic. Currently on Lasix 20 mg IV push daily for volume management. Will hold off ACE and ARB inhibitors secondary to upcoming bypass surgery. Will hold SGLT2 inhibitors for the same reasons. Beta-blockers were held secondary to soft blood pressures initially in the setting of acute blood loss anemia.  Severe AS Tentatively scheduled for aortic valve replacement November 02, 2023.  Bladder outlet obstruction Hematuria Acute blood loss anemia Status post blood transfusions and left prostate artery embolization with IR on 10/27/2023 Overall gross hematuria has improved, per  patient Hemoglobin this morning >8 g/dL Recommend hemoglobin greater than 8 Continuous bladder irrigation Urology following.  Time Spent Directly with Patient:  I have spent a total of 35 minutes with the patient reviewing hospital notes, telemetry, EKGs, labs and examining the patient as well as establishing an assessment and plan that was discussed personally with the patient.  > 50% of time was spent in direct patient care.    For questions or updates, please contact [facility] Please consult [email or web address] for contact info under    Signed, Tessa Lerner, DO, [facility]  [facility]  [address] [place], [place] [address] 11:28 AM 10/28/23

## 2023-10-28 NOTE — Plan of Care (Signed)
Patient remains on [facility]-3E at time of writing. Patient has CBI running through Foley; which is to be continued for now per Dr. Eliane Decree note today. No supplemental O2 requirement or active infusions.  Edit: CBI orders entered by Dr. Allena Katz overnight. It is worth noting, from 10/25/23 until 10/28/23 no input (besides intermittent flushes) was documented in the flowsheets. It is unclear without a strict measure of foley input the amount of the patient's own UOP.   Problem: Education: Goal: Knowledge of General Education information will improve Description: Including pain rating scale, medication(s)/side effects and non-pharmacologic comfort measures Outcome: Progressing   Problem: Health Behavior/Discharge Planning: Goal: Ability to manage health-related needs will improve Outcome: Progressing   Problem: Clinical Measurements: Goal: Ability to maintain clinical measurements within normal limits will improve Outcome: Progressing Goal: Will remain free from infection Outcome: Progressing Goal: Diagnostic test results will improve Outcome: Progressing Goal: Respiratory complications will improve Outcome: Progressing Goal: Cardiovascular complication will be avoided Outcome: Progressing   Problem: Activity: Goal: Risk for activity intolerance will decrease Outcome: Progressing   Problem: Nutrition: Goal: Adequate nutrition will be maintained Outcome: Progressing   Problem: Coping: Goal: Level of anxiety will decrease Outcome: Progressing   Problem: Elimination: Goal: Will not experience complications related to bowel motility Outcome: Progressing Goal: Will not experience complications related to urinary retention Outcome: Progressing   Problem: Pain Management: Goal: General experience of comfort will improve Outcome: Progressing   Problem: Safety: Goal: Ability to remain free from injury will improve Outcome: Progressing   Problem: Skin Integrity: Goal: Risk for impaired  skin integrity will decrease Outcome: Progressing   Problem: Education: Goal: Understanding of CV disease, CV risk reduction, and recovery process will improve Outcome: Progressing Goal: Individualized Educational Video(s) Outcome: Progressing   Problem: Activity: Goal: Ability to return to baseline activity level will improve Outcome: Progressing   Problem: Cardiovascular: Goal: Ability to achieve and maintain adequate cardiovascular perfusion will improve Outcome: Progressing Goal: Vascular access site(s) Level 0-1 will be maintained Outcome: Progressing   Problem: Health Behavior/Discharge Planning: Goal: Ability to safely manage health-related needs after discharge will improve Outcome: Progressing

## 2023-10-28 NOTE — Progress Notes (Addendum)
[facility]  - [place] at [facility]   PATIENT NAME: Kyle Matthews    MR#:  [id number]  DATE OF BIRTH:  02-03-1946  SUBJECTIVE:   Patient tells me he had a rough night with bladder spasms. Started on levsin and seems to be helping. Ongoing CBI with light pink urine. Po intake a bit better No family at bedside    VITALS:  Blood pressure (!) 119/56, pulse 73, temperature 98.4 F (36.9 C), temperature source Oral, resp. rate 16, height 5\' 3"  (1.6 m), weight 67.4 kg, SpO2 95%.  PHYSICAL EXAMINATION:   GENERAL:  77 y.o.-year-old patient with no acute distress. weak LUNGS: Normal breath sounds bilaterally, no wheezing CARDIOVASCULAR: S1, S2 normal. No murmur   ABDOMEN: Soft, nontender, nondistended. CBI + EXTREMITIES: +  edema b/l.    NEUROLOGIC: nonfocal  patient is alert and awake  LABORATORY PANEL:  CBC Recent Labs  Lab 10/28/23 0242  WBC 7.0  HGB 8.6*  HCT 26.9*  PLT 193    Chemistries  Recent Labs  Lab 10/25/23 0401 10/26/23 0341 10/28/23 0242  NA 135   < > 136  K 3.6   < > 3.9  CL 103   < > 102  CO2 26   < > 27  GLUCOSE 103*   < > 111*  BUN 29*   < > 18  CREATININE 1.03   < > 0.85  CALCIUM 7.8*   < > 8.3*  MG 2.2   < > 2.0  AST 12*  --   --   ALT 13  --   --   ALKPHOS 39  --   --   BILITOT 0.7  --   --    < > = values in this interval not displayed.    Assessment and Plan  Kyle Matthews is a 77 y.o. 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  He was admitted to the hospital about 2 weeks ago for chest pain and hematuria.  Eventual plan was to challenge him with DAPT for couple of weeks and if tolerates, proceed with TAVR/PCI.  Seen by urology but now developed hematuria again.  IR consulted for  prostate artery embolization.      Acute Blood Loss Anemia secondary to hematuria BPH with chronic indwelling Foley catheter --Unfortunately patient has been having recurrent hematuria.   --Urology planning ADT for now.  -- Status post multiple units of PRBC transfusion--4 units so far -- Additional units as necessary.  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   --Considering near future CABG/TAVR evaluation.   --[facility] Cardiology following--IV lasix every day     Acute Hypoxic Respiratory Failure Shortness of Breath --Respirations much better today.     Acute Kidney Injury Resolved.  Peaked at 1.3   Hypertension --IV as needed   GERD --PPI       DVT prophylaxis: SCD Code Status: full Family Communication: pt's wife has  been updated by husband Consults: Cardiology Urology Level of care: Progressive Status is: Inpatient Remains inpatient appropriate because: Hematuria, IR to do PA embolisation    TOTAL TIME TAKING CARE OF THIS PATIENT: 35 minutes.  >50% time spent on counselling and coordination of care  Note: This dictation was prepared with Dragon dictation along with smaller phrase technology. Any transcriptional errors that result from this process are unintentional.  Enedina Finner M.D    [facility]   CC: Primary care physician; Mila Palmer, MD

## 2023-10-28 NOTE — Plan of Care (Signed)

## 2023-10-28 NOTE — Progress Notes (Signed)
Pt c/o SOB requesting breathing treatment, duoneb administered and is effective.

## 2023-10-29 DIAGNOSIS — D649 Anemia, unspecified: Secondary | ICD-10-CM | POA: Diagnosis not present

## 2023-10-29 DIAGNOSIS — I350 Nonrheumatic aortic (valve) stenosis: Secondary | ICD-10-CM | POA: Diagnosis not present

## 2023-10-29 DIAGNOSIS — I2510 Atherosclerotic heart disease of native coronary artery without angina pectoris: Secondary | ICD-10-CM | POA: Diagnosis not present

## 2023-10-29 DIAGNOSIS — N400 Enlarged prostate without lower urinary tract symptoms: Secondary | ICD-10-CM | POA: Diagnosis not present

## 2023-10-29 DIAGNOSIS — I214 Non-ST elevation (NSTEMI) myocardial infarction: Secondary | ICD-10-CM | POA: Diagnosis not present

## 2023-10-29 DIAGNOSIS — I5031 Acute diastolic (congestive) heart failure: Secondary | ICD-10-CM | POA: Diagnosis not present

## 2023-10-29 LAB — BASIC METABOLIC PANEL
Anion gap: 8 (ref 5–15)
BUN: 12 mg/dL (ref 8–23)
CO2: 24 mmol/L (ref 22–32)
Calcium: 8.1 mg/dL — ABNORMAL LOW (ref 8.9–10.3)
Chloride: 102 mmol/L (ref 98–111)
Creatinine, Ser: 0.73 mg/dL (ref 0.61–1.24)
GFR, Estimated: >60 mL/min (ref >60)
Glucose, Bld: 111 mg/dL — ABNORMAL HIGH (ref 70–99)
Potassium: 3.6 mmol/L (ref 3.5–5.1)
Sodium: 134 mmol/L — ABNORMAL LOW (ref 135–145)

## 2023-10-29 LAB — CBC
HCT: 26.4 % — ABNORMAL LOW (ref 39.0–52.0)
Hemoglobin: 8.4 g/dL — ABNORMAL LOW (ref 13.0–17.0)
MCH: 28.3 pg (ref 26.0–34.0)
MCHC: 31.8 g/dL (ref 30.0–36.0)
MCV: 88.9 fL (ref 80.0–100.0)
Platelets: 194 10*3/uL (ref 150–400)
RBC: 2.97 MIL/uL — ABNORMAL LOW (ref 4.22–5.81)
RDW: 14.6 % (ref 11.5–15.5)
WBC: 12.4 10*3/uL — ABNORMAL HIGH (ref 4.0–10.5)
nRBC: 0 % (ref 0.0–0.2)

## 2023-10-29 LAB — MAGNESIUM: Magnesium: 2 mg/dL (ref 1.7–2.4)

## 2023-10-29 MED ORDER — ORAL CARE MOUTH RINSE: 15.0000 mL | OROMUCOSAL | Status: DC | PRN

## 2023-10-29 NOTE — Plan of Care (Signed)
Patient remains on [facility]-3E at time of writing. CBI on-going and urine is much clearer tonight over last night. No acute changes overnight.   Problem: Education: Goal: Knowledge of General Education information will improve Description: Including pain rating scale, medication(s)/side effects and non-pharmacologic comfort measures Outcome: Progressing   Problem: Health Behavior/Discharge Planning: Goal: Ability to manage health-related needs will improve Outcome: Progressing   Problem: Clinical Measurements: Goal: Ability to maintain clinical measurements within normal limits will improve Outcome: Progressing Goal: Will remain free from infection Outcome: Progressing Goal: Diagnostic test results will improve Outcome: Progressing Goal: Respiratory complications will improve Outcome: Progressing Goal: Cardiovascular complication will be avoided Outcome: Progressing   Problem: Activity: Goal: Risk for activity intolerance will decrease Outcome: Progressing   Problem: Nutrition: Goal: Adequate nutrition will be maintained Outcome: Progressing   Problem: Coping: Goal: Level of anxiety will decrease Outcome: Progressing   Problem: Elimination: Goal: Will not experience complications related to bowel motility Outcome: Progressing Goal: Will not experience complications related to urinary retention Outcome: Progressing   Problem: Pain Management: Goal: General experience of comfort will improve Outcome: Progressing   Problem: Safety: Goal: Ability to remain free from injury will improve Outcome: Progressing   Problem: Skin Integrity: Goal: Risk for impaired skin integrity will decrease Outcome: Progressing   Problem: Education: Goal: Understanding of CV disease, CV risk reduction, and recovery process will improve Outcome: Progressing Goal: Individualized Educational Video(s) Outcome: Progressing   Problem: Activity: Goal: Ability to return to baseline activity level  will improve Outcome: Progressing   Problem: Cardiovascular: Goal: Ability to achieve and maintain adequate cardiovascular perfusion will improve Outcome: Progressing Goal: Vascular access site(s) Level 0-1 will be maintained Outcome: Progressing   Problem: Health Behavior/Discharge Planning: Goal: Ability to safely manage health-related needs after discharge will improve Outcome: Progressing

## 2023-10-29 NOTE — Plan of Care (Signed)

## 2023-10-29 NOTE — Progress Notes (Addendum)
[facility]  - [place] at [facility]   PATIENT NAME: Kyle Matthews    MR#:  [id number]  DATE OF BIRTH:  1946-01-20  SUBJECTIVE:   No family at bedside. Patient had an uneventful night. Some blood clots had to be removed by RN. Patient says his bladder spasms are much better.. Started on levsin and seems to be helping. Ongoing CBI with light pink urine.  VITALS:  Blood pressure (!) 109/49, pulse 78, temperature 99 F (37.2 C), temperature source Oral, resp. rate 17, height 5\' 3"  (1.6 m), weight 65.8 kg, SpO2 92%.  PHYSICAL EXAMINATION:   GENERAL:  77 y.o.-year-old patient with no acute distress. weak LUNGS: Normal breath sounds bilaterally, no wheezing CARDIOVASCULAR: S1, S2 normal. No murmur   ABDOMEN: Soft, nontender, nondistended. CBI + EXTREMITIES: +  edema b/l.    NEUROLOGIC: nonfocal  patient is alert and awake Pressure Injury 10/25/23 Ear Left Stage 1 -  Intact skin with non-blanchable redness of a localized area usually over a bony prominence. behind left ear due to nasal cannula (Active)  10/25/23 1035  Location: Ear  Location Orientation: Left  Staging: Stage 1 -  Intact skin with non-blanchable redness of a localized area usually over a bony prominence.  Wound Description (Comments): behind left ear due to nasal cannula  Present on Admission:      LABORATORY PANEL:  CBC Recent Labs  Lab 10/29/23 0259  WBC 12.4*  HGB 8.4*  HCT 26.4*  PLT 194    Chemistries  Recent Labs  Lab 10/25/23 0401 10/26/23 0341 10/29/23 0259  NA 135   < > 134*  K 3.6   < > 3.6  CL 103   < > 102  CO2 26   < > 24  GLUCOSE 103*   < > 111*  BUN 29*   < > 12  CREATININE 1.03   < > 0.73  CALCIUM 7.8*   < > 8.1*  MG 2.2   < > 2.0  AST 12*  --   --   ALT 13  --   --   ALKPHOS 39  --   --   BILITOT 0.7  --   --    < > = values in this interval not displayed.    Assessment and Plan  Kyle Matthews is a 77 y.o. 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  He was admitted to the hospital about 2 weeks ago for chest pain and hematuria.  Eventual plan was to challenge him with DAPT for couple of weeks and if tolerates, proceed with TAVR/PCI.  Seen by urology but now developed hematuria again.  IR consulted for prostate artery embolization.      Acute Blood Loss Anemia secondary to hematuria BPH  --patient has been having recurrent hematuria.   --Urology planning ADT for now.  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   --Considering near future CABG/TAVR evaluation-- F present scheduled for November 14 --[facility] Cardiology following--IV lasix every day     Acute Hypoxic Respiratory Failure Shortness of Breath -- resolved. Patient on room air  Acute Kidney Injury Resolved.  Peaked at 1.3   Hypertension --IV as needed   GERD --PPI       DVT prophylaxis: SCD Code Status: full Family Communication: pt's wife has been updated by husband Consults: Cardiology Urology Level of care: Progressive Status is: Inpatient Remains inpatient appropriate because: ongoing CBI, possible CABG/TAVR  TOTAL TIME TAKING CARE OF THIS PATIENT: 35 minutes.  >50% time spent on counselling and coordination of care  Note: This dictation was prepared with Dragon dictation along  with smaller phrase technology. Any transcriptional errors that result from this process are unintentional.  Enedina Finner M.D    [facility]   CC: Primary care physician; Kyle Palmer, MD

## 2023-10-29 NOTE — Progress Notes (Signed)
Rounding Note    Patient Name: Kyle Matthews Date of Encounter: 10/29/2023  [facility] Cardiologist: Maisie Fus, MD   Subjective  Interval history: Resting in bed comfortably. Bladder spasms have improved.  No more gross hematuria.  No chest pain or shortness of breath  Inpatient Medications    Scheduled Meds:  Chlorhexidine Gluconate Cloth  6 each Topical Daily   finasteride  5 mg Oral QPM   furosemide  20 mg Intravenous Daily   pantoprazole  40 mg Oral Daily   rosuvastatin  40 mg Oral Daily   Continuous Infusions:  sodium chloride irrigation      PRN Meds: acetaminophen **OR** acetaminophen, hydrALAZINE, hyoscyamine, ipratropium-albuterol, melatonin, metoprolol tartrate, morphine injection, ondansetron (ZOFRAN) IV, mouth rinse, oxyCODONE, senna-docusate   Vital Signs    Vitals:   10/29/23 0300 10/29/23 0400 10/29/23 0445 10/29/23 0802  BP:   (!) 113/52 (!) 109/49  Pulse: 95 74 79 78  Resp:   17   Temp:   99.3 F (37.4 C) 99 F (37.2 C)  TempSrc:   Oral Oral  SpO2: 92% 94% 93% 92%  Weight:   65.8 kg   Height:        Intake/Output Summary (Last 24 hours) at 10/29/2023 1217 Last data filed at 10/29/2023 1147 Gross per 24 hour  Intake 40981 ml  Output 16705 ml  Net -3957 ml      10/29/2023    4:45 AM 10/28/2023    6:25 AM 10/26/2023    3:02 AM  Last 3 Weights  Weight (lbs) 145 lb 1 oz 148 lb 9.4 oz 155 lb 10.3 oz  Weight (kg) 65.8 kg 67.4 kg 70.6 kg      Telemetry    Sinus rhythm without ectopy.- Personally Reviewed  ECG  No new  Initial Sinus rhythm with diffuse ST depression and mild aVR elevation- Personally Reviewed  Physical Exam   Vitals:   10/29/23 0445 10/29/23 0802  BP: (!) 113/52 (!) 109/49  Pulse: 79 78  Resp: 17   Temp: 99.3 F (37.4 C) 99 F (37.2 C)  SpO2: 93% 92%    GEN:  Hemodynamically stable, no acute distress Neck: No JVP. Cardiac: RRR, SEM RUSB that radiates to the apex Respiratory: nl wob, mild  decreased breath sounds in the bases likely due to decreased effort GI: Soft, nontender, nondistended. GU: Continuous bladder irrigation MS: No significant lower extremity, warm to touch Neuro:  Nonfocal  Psych: Normal affect   Labs    High Sensitivity Troponin:   Recent Labs  Lab 10/03/23 1521 10/04/23 0505 10/23/23 0129 10/23/23 0740 10/23/23 1700  TROPONINIHS 4,814* 3,357* 369* 482* 1,234*     Chemistry Recent Labs  Lab 10/23/23 0800 10/24/23 0556 10/25/23 0401 10/26/23 0341 10/27/23 0357 10/28/23 0242 10/29/23 0259  NA 136 135 135   < > 138 136 134*  K 4.1 4.5 3.6   < > 3.8 3.9 3.6  CL 105 102 103   < > 104 102 102  CO2 21* 25 26   < > 27 27 24   GLUCOSE 106* 110* 103*   < > 102* 111* 111*  BUN 18 27* 29*   < > 25* 18 12  CREATININE 0.90 1.36* 1.03   < > 1.04 0.85 0.73  CALCIUM 8.4* 8.3* 7.8*   < > 8.1* 8.3* 8.1*  MG 1.7 1.9 2.2   < > 2.3 2.0 2.0  PROT 5.1* 5.4* 4.6*  --   --   --   --  ALBUMIN 2.7* 2.6* 2.1*  --   --   --   --   AST 19 20 12*  --   --   --   --   ALT 18 17 13   --   --   --   --   ALKPHOS 37* 40 39  --   --   --   --   BILITOT 1.2* 0.7 0.7  --   --   --   --   GFRNONAA >60 54* >60   < > >60 >60 >60  ANIONGAP 10 8 6    < > 7 7 8    < > = values in this interval not displayed.    Lipids  Recent Labs  Lab 10/23/23 0129  CHOL 122  TRIG 74  HDL 52  LDLCALC 55  CHOLHDL 2.3    Hematology Recent Labs  Lab 10/27/23 0357 10/28/23 0242 10/29/23 0259  WBC 4.8 7.0 12.4*  RBC 2.87* 2.99* 2.97*  HGB 8.4* 8.6* 8.4*  HCT 25.9* 26.9* 26.4*  MCV 90.2 90.0 88.9  MCH 29.3 28.8 28.3  MCHC 32.4 32.0 31.8  RDW 14.9 14.7 14.6  PLT 180 193 194   Thyroid No results for input(s): "TSH", "FREET4" in the last 168 hours.  BNPNo results for input(s): "BNP", "PROBNP" in the last 168 hours.  DDimer No results for input(s): "DDIMER" in the last 168 hours.   Radiology    IR EMBO ARTERIAL NOT HEMORR HEMANG INC GUIDE ROADMAPPING  Result Date:  10/27/2023 INDICATION: 77 year old male referred for prostate artery embolization for hematuria EXAM: ULTRASOUND-GUIDED ACCESS RIGHT COMMON FEMORAL ARTERY PELVIC ANGIOGRAM SUPER SELECTIVE CATHETER PLACEMENT INTO THE LEFT PROSTATIC ARTERY, LEFT INFERIOR EPIGASTRIC ARTERY, RIGHT INTERNAL ILIAC ARTERY DIVISION BRANCHES INCLUDING ANTERIOR DIVISION, INFERIOR GLUTEAL ARTERY EMBOLIZATION WITH CALIBRATED BEADS AND COILS OF LEFT PROSTATIC ARTERY CELT FOR HEMOSTASIS MEDICATIONS: 2 g Rocephin. The antibiotic was administered within 1 hour of the procedure ANESTHESIA/SEDATION: Moderate (conscious) sedation was employed during this procedure. A total of Versed 2.5 mg and Fentanyl 100 mcg was administered intravenously by the radiology nurse. Total intra-service moderate Sedation Time: 143 minutes. The patient's level of consciousness and vital signs were monitored continuously by radiology nursing throughout the procedure under my direct supervision. CONTRAST:  100 cc FLUOROSCOPY: Radiation Exposure Index (as provided by the fluoroscopic device): 3,613 mGy Kerma COMPLICATIONS: None PROCEDURE: Informed consent was obtained from the patient following explanation of the procedure, risks, benefits and alternatives. The patient understands, agrees and consents for the procedure. All questions were addressed. A time out was performed prior to the initiation of the procedure. Maximal barrier sterile technique utilized including caps, mask, sterile gowns, sterile gloves, large sterile drape, hand hygiene, and Betadine prep. Ultrasound survey of the right inguinal region was performed with images stored and sent to PACs, confirming patency of the right common femoral artery. A micropuncture needle was used access the right common femoral artery under ultrasound. With excellent arterial blood flow returned, and an .018 micro wire was passed through the needle, observed enter the abdominal aorta under fluoroscopy. The needle was removed,  and a micropuncture sheath was placed over the wire. The inner dilator and wire were removed, and an 035 Bentson wire was advanced under fluoroscopy into the abdominal aorta. The sheath was removed and a standard 5 [place] vascular sheath was placed. The dilator was removed and the sheath was flushed. Omni Flush catheter was advanced on the Bentson wire into the abdominal aorta. Wire was removed  and angiogram was performed. Omni Flush catheter was used direct a Bentson wire into the left iliac system. Omni Flush catheter was removed. C2 cobra catheter was advanced on the Bentson wire. Angiogram was performed. Given the laterally directed position of the Cobra catheter we elected to exchange for a Kumpe the catheter. The exchange was made on a Bentson wire. The Bentson wire was directed towards the inferior epigastric artery. Once the tip of the catheter was directed medially towards the artery and angiogram confirmed the location, STC Renegade catheter was advanced on an 014 fathom wire into the inferior epigastric artery, and then used to select the replaced prostatic artery from the replaced operator artery. Once we positioned the microcatheter beyond the ostium of the operator artery, angiogram was performed. We then performed calibrated bead embolization with Hydrospheres . Ten beat stasis was achieved. Coil embolization was then performed with 2 interlock coils. Final angiogram was performed. The Kumpe the catheter was then exchanged for a C2 Cobra catheter. Waltman loop was formed with the Bentson wire and then used to select the ipsilateral iliac system. Catheter was position into the ostium of the internal iliac artery. Angiogram was performed. Angiogram was performed in multiple obliquities over time. The majority of the fluoroscopy time was on this right side, and we used multiple obliquities and multiple catheter and wire combinations in order to attempt selecting the right prostate artery. This  included the [facility] Renegade catheter, straight tip, angled tip, double angle 018 glide wire, 014 synchro soft, 014 fathom wire. Ultimately it seems that the atherosclerotic plaque at the origin of the takeoff was obstructing from this angle approach. We ultimately withdrew at 50 minutes of fluoro time. All catheters wires were withdrawn. Celt was deployed for hemostasis. Patient tolerated the procedure well and remained hemodynamically stable throughout. No complications were encountered and no significant blood loss. IMPRESSION: Status post ultrasound guided access right common femoral artery for pelvic angiogram, combination calibrated bead and coil embolization of the left prostatic artery to stasis, and failed right prostatic artery embolization. Celt for hemostasis Signed, Yvone Neu. Miachel Roux, RPVI [facility] Inter[facility] Radiology Special[facility] Electronically Signed   By: Gilmer Mor D.O.   On: 10/27/2023 13:54   IR US Guide Vasc Access Right  Result Date: 10/27/2023 INDICATION: 77 year old male referred for prostate artery embolization for hematuria EXAM: ULTRASOUND-GUIDED ACCESS RIGHT COMMON FEMORAL ARTERY PELVIC ANGIOGRAM SUPER SELECTIVE CATHETER PLACEMENT INTO THE LEFT PROSTATIC ARTERY, LEFT INFERIOR EPIGASTRIC ARTERY, RIGHT INTERNAL ILIAC ARTERY DIVISION BRANCHES INCLUDING ANTERIOR DIVISION, INFERIOR GLUTEAL ARTERY EMBOLIZATION WITH CALIBRATED BEADS AND COILS OF LEFT PROSTATIC ARTERY CELT FOR HEMOSTASIS MEDICATIONS: 2 g Rocephin. The antibiotic was administered within 1 hour of the procedure ANESTHESIA/SEDATION: Moderate (conscious) sedation was employed during this procedure. A total of Versed 2.5 mg and Fentanyl 100 mcg was administered intravenously by the radiology nurse. Total intra-service moderate Sedation Time: 143 minutes. The patient's level of consciousness and vital signs were monitored continuously by radiology nursing throughout the procedure under my direct  supervision. CONTRAST:  100 cc FLUOROSCOPY: Radiation Exposure Index (as provided by the fluoroscopic device): 3,613 mGy Kerma COMPLICATIONS: None PROCEDURE: Informed consent was obtained from the patient following explanation of the procedure, risks, benefits and alternatives. The patient understands, agrees and consents for the procedure. All questions were addressed. A time out was performed prior to the initiation of the procedure. Maximal barrier sterile technique utilized including caps, mask, sterile gowns, sterile gloves, large sterile drape, hand hygiene, and  Betadine prep. Ultrasound survey of the right inguinal region was performed with images stored and sent to PACs, confirming patency of the right common femoral artery. A micropuncture needle was used access the right common femoral artery under ultrasound. With excellent arterial blood flow returned, and an .018 micro wire was passed through the needle, observed enter the abdominal aorta under fluoroscopy. The needle was removed, and a micropuncture sheath was placed over the wire. The inner dilator and wire were removed, and an 035 Bentson wire was advanced under fluoroscopy into the abdominal aorta. The sheath was removed and a standard 5 [place] vascular sheath was placed. The dilator was removed and the sheath was flushed. Omni Flush catheter was advanced on the Bentson wire into the abdominal aorta. Wire was removed and angiogram was performed. Omni Flush catheter was used direct a Bentson wire into the left iliac system. Omni Flush catheter was removed. C2 cobra catheter was advanced on the Bentson wire. Angiogram was performed. Given the laterally directed position of the Cobra catheter we elected to exchange for a Kumpe the catheter. The exchange was made on a Bentson wire. The Bentson wire was directed towards the inferior epigastric artery. Once the tip of the catheter was directed medially towards the artery and angiogram confirmed the  location, STC Renegade catheter was advanced on an 014 fathom wire into the inferior epigastric artery, and then used to select the replaced prostatic artery from the replaced operator artery. Once we positioned the microcatheter beyond the ostium of the operator artery, angiogram was performed. We then performed calibrated bead embolization with Hydrospheres . Ten beat stasis was achieved. Coil embolization was then performed with 2 interlock coils. Final angiogram was performed. The Kumpe the catheter was then exchanged for a C2 Cobra catheter. Waltman loop was formed with the Bentson wire and then used to select the ipsilateral iliac system. Catheter was position into the ostium of the internal iliac artery. Angiogram was performed. Angiogram was performed in multiple obliquities over time. The majority of the fluoroscopy time was on this right side, and we used multiple obliquities and multiple catheter and wire combinations in order to attempt selecting the right prostate artery. This included the [facility] Renegade catheter, straight tip, angled tip, double angle 018 glide wire, 014 synchro soft, 014 fathom wire. Ultimately it seems that the atherosclerotic plaque at the origin of the takeoff was obstructing from this angle approach. We ultimately withdrew at 50 minutes of fluoro time. All catheters wires were withdrawn. Celt was deployed for hemostasis. Patient tolerated the procedure well and remained hemodynamically stable throughout. No complications were encountered and no significant blood loss. IMPRESSION: Status post ultrasound guided access right common femoral artery for pelvic angiogram, combination calibrated bead and coil embolization of the left prostatic artery to stasis, and failed right prostatic artery embolization. Celt for hemostasis Signed, Yvone Neu. Miachel Roux, RPVI Vascular and Interventional Radiology Specialists [facility] Electronically Signed   By: Gilmer Mor  D.O.   On: 10/27/2023 13:54   IR Angiogram Pelvis Selective Or Supraselective  Result Date: 10/27/2023 INDICATION: 77 year old male referred for prostate artery embolization for hematuria EXAM: ULTRASOUND-GUIDED ACCESS RIGHT COMMON FEMORAL ARTERY PELVIC ANGIOGRAM SUPER SELECTIVE CATHETER PLACEMENT INTO THE LEFT PROSTATIC ARTERY, LEFT INFERIOR EPIGASTRIC ARTERY, RIGHT INTERNAL ILIAC ARTERY DIVISION BRANCHES INCLUDING ANTERIOR DIVISION, INFERIOR GLUTEAL ARTERY EMBOLIZATION WITH CALIBRATED BEADS AND COILS OF LEFT PROSTATIC ARTERY CELT FOR HEMOSTASIS MEDICATIONS: 2 g Rocephin. The antibiotic was administered within 1 hour of the procedure  ANESTHESIA/SEDATION: Moderate (conscious) sedation was employed during this procedure. A total of Versed 2.5 mg and Fentanyl 100 mcg was administered intravenously by the radiology nurse. Total intra-service moderate Sedation Time: 143 minutes. The patient's level of consciousness and vital signs were monitored continuously by radiology nursing throughout the procedure under my direct supervision. CONTRAST:  100 cc FLUOROSCOPY: Radiation Exposure Index (as provided by the fluoroscopic device): 3,613 mGy Kerma COMPLICATIONS: None PROCEDURE: Informed consent was obtained from the patient following explanation of the procedure, risks, benefits and alternatives. The patient understands, agrees and consents for the procedure. All questions were addressed. A time out was performed prior to the initiation of the procedure. Maximal barrier sterile technique utilized including caps, mask, sterile gowns, sterile gloves, large sterile drape, hand hygiene, and Betadine prep. Ultrasound survey of the right inguinal region was performed with images stored and sent to PACs, confirming patency of the right common femoral artery. A micropuncture needle was used access the right common femoral artery under ultrasound. With excellent arterial blood flow returned, and an .018 micro wire was passed  through the needle, observed enter the abdominal aorta under fluoroscopy. The needle was removed, and a micropuncture sheath was placed over the wire. The inner dilator and wire were removed, and an 035 Bentson wire was advanced under fluoroscopy into the abdominal aorta. The sheath was removed and a standard 5 [place] vascular sheath was placed. The dilator was removed and the sheath was flushed. Omni Flush catheter was advanced on the Bentson wire into the abdominal aorta. Wire was removed and angiogram was performed. Omni Flush catheter was used direct a Bentson wire into the left iliac system. Omni Flush catheter was removed. C2 cobra catheter was advanced on the Bentson wire. Angiogram was performed. Given the laterally directed position of the Cobra catheter we elected to exchange for a Kumpe the catheter. The exchange was made on a Bentson wire. The Bentson wire was directed towards the inferior epigastric artery. Once the tip of the catheter was directed medially towards the artery and angiogram confirmed the location, STC Renegade catheter was advanced on an 014 fathom wire into the inferior epigastric artery, and then used to select the replaced prostatic artery from the replaced operator artery. Once we positioned the microcatheter beyond the ostium of the operator artery, angiogram was performed. We then performed calibrated bead embolization with Hydrospheres . Ten beat stasis was achieved. Coil embolization was then performed with 2 interlock coils. Final angiogram was performed. The Kumpe the catheter was then exchanged for a C2 Cobra catheter. Waltman loop was formed with the Bentson wire and then used to select the ipsilateral iliac system. Catheter was position into the ostium of the internal iliac artery. Angiogram was performed. Angiogram was performed in multiple obliquities over time. The majority of the fluoroscopy time was on this right side, and we used multiple obliquities and  multiple catheter and wire combinations in order to attempt selecting the right prostate artery. This included the [facility] Renegade catheter, straight tip, angled tip, double angle 018 glide wire, 014 synchro soft, 014 fathom wire. Ultimately it seems that the atherosclerotic plaque at the origin of the takeoff was obstructing from this angle approach. We ultimately withdrew at 50 minutes of fluoro time. All catheters wires were withdrawn. Celt was deployed for hemostasis. Patient tolerated the procedure well and remained hemodynamically stable throughout. No complications were encountered and no significant blood loss. IMPRESSION: Status post ultrasound guided access right common femoral artery for pelvic angiogram,  combination calibrated bead and coil embolization of the left prostatic artery to stasis, and failed right prostatic artery embolization. Celt for hemostasis Signed, Yvone Neu. Miachel Roux, RPVI Vascular and Interventional Radiology Specialists [facility] Radiology Electronically Signed   By: Gilmer Mor D.O.   On: 10/27/2023 13:54   IR Angiogram Pelvis Selective Or Supraselective  Result Date: 10/27/2023 INDICATION: 77 year old male referred for prostate artery embolization for hematuria EXAM: ULTRASOUND-GUIDED ACCESS RIGHT COMMON FEMORAL ARTERY PELVIC ANGIOGRAM SUPER SELECTIVE CATHETER PLACEMENT INTO THE LEFT PROSTATIC ARTERY, LEFT INFERIOR EPIGASTRIC ARTERY, RIGHT INTERNAL ILIAC ARTERY DIVISION BRANCHES INCLUDING ANTERIOR DIVISION, INFERIOR GLUTEAL ARTERY EMBOLIZATION WITH CALIBRATED BEADS AND COILS OF LEFT PROSTATIC ARTERY CELT FOR HEMOSTASIS MEDICATIONS: 2 g Rocephin. The antibiotic was administered within 1 hour of the procedure ANESTHESIA/SEDATION: Moderate (conscious) sedation was employed during this procedure. A total of Versed 2.5 mg and Fentanyl 100 mcg was administered intravenously by the radiology nurse. Total intra-service moderate Sedation Time: 143 minutes. The patient's level of  consciousness and vital signs were monitored continuously by radiology nursing throughout the procedure under my direct supervision. CONTRAST:  100 cc FLUOROSCOPY: Radiation Exposure Index (as provided by the fluoroscopic device): 3,613 mGy Kerma COMPLICATIONS: None PROCEDURE: Informed consent was obtained from the patient following explanation of the procedure, risks, benefits and alternatives. The patient understands, agrees and consents for the procedure. All questions were addressed. A time out was performed prior to the initiation of the procedure. Maximal barrier sterile technique utilized including caps, mask, sterile gowns, sterile gloves, large sterile drape, hand hygiene, and Betadine prep. Ultrasound survey of the right inguinal region was performed with images stored and sent to PACs, confirming patency of the right common femoral artery. A micropuncture needle was used access the right common femoral artery under ultrasound. With excellent arterial blood flow returned, and an .018 micro wire was passed through the needle, observed enter the abdominal aorta under fluoroscopy. The needle was removed, and a micropuncture sheath was placed over the wire. The inner dilator and wire were removed, and an 035 Bentson wire was advanced under fluoroscopy into the abdominal aorta. The sheath was removed and a standard 5 [place] vascular sheath was placed. The dilator was removed and the sheath was flushed. Omni Flush catheter was advanced on the Bentson wire into the abdominal aorta. Wire was removed and angiogram was performed. Omni Flush catheter was used direct a Bentson wire into the left iliac system. Omni Flush catheter was removed. C2 cobra catheter was advanced on the Bentson wire. Angiogram was performed. Given the laterally directed position of the Cobra catheter we elected to exchange for a Kumpe the catheter. The exchange was made on a Bentson wire. The Bentson wire was directed towards the inferior  epigastric artery. Once the tip of the catheter was directed medially towards the artery and angiogram confirmed the location, STC Renegade catheter was advanced on an 014 fathom wire into the inferior epigastric artery, and then used to select the replaced prostatic artery from the replaced operator artery. Once we positioned the microcatheter beyond the ostium of the operator artery, angiogram was performed. We then performed calibrated bead embolization with Hydrospheres . Ten beat stasis was achieved. Coil embolization was then performed with 2 interlock coils. Final angiogram was performed. The Kumpe the catheter was then exchanged for a C2 Cobra catheter. Waltman loop was formed with the Bentson wire and then used to select the ipsilateral iliac system. Catheter was position into the ostium of the internal iliac artery.  Angiogram was performed. Angiogram was performed in multiple obliquities over time. The majority of the fluoroscopy time was on this right side, and we used multiple obliquities and multiple catheter and wire combinations in order to attempt selecting the right prostate artery. This included the [facility] Renegade catheter, straight tip, angled tip, double angle 018 glide wire, 014 synchro soft, 014 fathom wire. Ultimately it seems that the atherosclerotic plaque at the origin of the takeoff was obstructing from this angle approach. We ultimately withdrew at 50 minutes of fluoro time. All catheters wires were withdrawn. Celt was deployed for hemostasis. Patient tolerated the procedure well and remained hemodynamically stable throughout. No complications were encountered and no significant blood loss. IMPRESSION: Status post ultrasound guided access right common femoral artery for pelvic angiogram, combination calibrated bead and coil embolization of the left prostatic artery to stasis, and failed right prostatic artery embolization. Celt for hemostasis Signed, Yvone Neu. Miachel Roux, RPVI  Vascular and Interventional Radiology Specialists [facility] Radiology Electronically Signed   By: Gilmer Mor D.O.   On: 10/27/2023 13:54   IR Angiogram Selective Each Additional Vessel  Result Date: 10/27/2023 INDICATION: 77 year old male referred for prostate artery embolization for hematuria EXAM: ULTRASOUND-GUIDED ACCESS RIGHT COMMON FEMORAL ARTERY PELVIC ANGIOGRAM SUPER SELECTIVE CATHETER PLACEMENT INTO THE LEFT PROSTATIC ARTERY, LEFT INFERIOR EPIGASTRIC ARTERY, RIGHT INTERNAL ILIAC ARTERY DIVISION BRANCHES INCLUDING ANTERIOR DIVISION, INFERIOR GLUTEAL ARTERY EMBOLIZATION WITH CALIBRATED BEADS AND COILS OF LEFT PROSTATIC ARTERY CELT FOR HEMOSTASIS MEDICATIONS: 2 g Rocephin. The antibiotic was administered within 1 hour of the procedure ANESTHESIA/SEDATION: Moderate (conscious) sedation was employed during this procedure. A total of Versed 2.5 mg and Fentanyl 100 mcg was administered intravenously by the radiology nurse. Total intra-service moderate Sedation Time: 143 minutes. The patient's level of consciousness and vital signs were monitored continuously by radiology nursing throughout the procedure under my direct supervision. CONTRAST:  100 cc FLUOROSCOPY: Radiation Exposure Index (as provided by the fluoroscopic device): 3,613 mGy Kerma COMPLICATIONS: None PROCEDURE: Informed consent was obtained from the patient following explanation of the procedure, risks, benefits and alternatives. The patient understands, agrees and consents for the procedure. All questions were addressed. A time out was performed prior to the initiation of the procedure. Maximal barrier sterile technique utilized including caps, mask, sterile gowns, sterile gloves, large sterile drape, hand hygiene, and Betadine prep. Ultrasound survey of the right inguinal region was performed with images stored and sent to PACs, confirming patency of the right common femoral artery. A micropuncture needle was used access the right common  femoral artery under ultrasound. With excellent arterial blood flow returned, and an .018 micro wire was passed through the needle, observed enter the abdominal aorta under fluoroscopy. The needle was removed, and a micropuncture sheath was placed over the wire. The inner dilator and wire were removed, and an 035 Bentson wire was advanced under fluoroscopy into the abdominal aorta. The sheath was removed and a standard 5 [place] vascular sheath was placed. The dilator was removed and the sheath was flushed. Omni Flush catheter was advanced on the Bentson wire into the abdominal aorta. Wire was removed and angiogram was performed. Omni Flush catheter was used direct a Bentson wire into the left iliac system. Omni Flush catheter was removed. C2 cobra catheter was advanced on the Bentson wire. Angiogram was performed. Given the laterally directed position of the Cobra catheter we elected to exchange for a Kumpe the catheter. The exchange was made on a Bentson wire. The Bentson wire was directed  towards the inferior epigastric artery. Once the tip of the catheter was directed medially towards the artery and angiogram confirmed the location, STC Renegade catheter was advanced on an 014 fathom wire into the inferior epigastric artery, and then used to select the replaced prostatic artery from the replaced operator artery. Once we positioned the microcatheter beyond the ostium of the operator artery, angiogram was performed. We then performed calibrated bead embolization with Hydrospheres . Ten beat stasis was achieved. Coil embolization was then performed with 2 interlock coils. Final angiogram was performed. The Kumpe the catheter was then exchanged for a C2 Cobra catheter. Waltman loop was formed with the Bentson wire and then used to select the ipsilateral iliac system. Catheter was position into the ostium of the internal iliac artery. Angiogram was performed. Angiogram was performed in multiple obliquities  over time. The majority of the fluoroscopy time was on this right side, and we used multiple obliquities and multiple catheter and wire combinations in order to attempt selecting the right prostate artery. This included the [facility] Renegade catheter, straight tip, angled tip, double angle 018 glide wire, 014 synchro soft, 014 fathom wire. Ultimately it seems that the atherosclerotic plaque at the origin of the takeoff was obstructing from this angle approach. We ultimately withdrew at 50 minutes of fluoro time. All catheters wires were withdrawn. Celt was deployed for hemostasis. Patient tolerated the procedure well and remained hemodynamically stable throughout. No complications were encountered and no significant blood loss. IMPRESSION: Status post ultrasound guided access right common femoral artery for pelvic angiogram, combination calibrated bead and coil embolization of the left prostatic artery to stasis, and failed right prostatic artery embolization. Celt for hemostasis Signed, Yvone Neu. Miachel Roux, RPVI [facility] Radiology Electronically Signed   By: Gilmer Mor D.O.   On: 10/27/2023 13:54   IR Angiogram Selective Each Additional Vessel  Result Date: 10/27/2023 INDICATION: 77 year old male referred for prostate artery embolization for hematuria EXAM: ULTRASOUND-GUIDED ACCESS RIGHT COMMON FEMORAL ARTERY PELVIC ANGIOGRAM SUPER SELECTIVE CATHETER PLACEMENT INTO THE LEFT PROSTATIC ARTERY, LEFT INFERIOR EPIGASTRIC ARTERY, RIGHT INTERNAL ILIAC ARTERY DIVISION BRANCHES INCLUDING ANTERIOR DIVISION, INFERIOR GLUTEAL ARTERY EMBOLIZATION WITH CALIBRATED BEADS AND COILS OF LEFT PROSTATIC ARTERY CELT FOR HEMOSTASIS MEDICATIONS: 2 g Rocephin. The antibiotic was administered within 1 hour of the procedure ANESTHESIA/SEDATION: Moderate (conscious) sedation was employed during this procedure. A total of Versed 2.5 mg and Fentanyl 100 mcg was administered  intravenously by the radiology nurse. Total intra-service moderate Sedation Time: 143 minutes. The patient's level of consciousness and vital signs were monitored continuously by radiology nursing throughout the procedure under my direct supervision. CONTRAST:  100 cc FLUOROSCOPY: Radiation Exposure Index (as provided by the fluoroscopic device): 3,613 mGy Kerma COMPLICATIONS: None PROCEDURE: Informed consent was obtained from the patient following explanation of the procedure, risks, benefits and alternatives. The patient understands, agrees and consents for the procedure. All questions were addressed. A time out was performed prior to the initiation of the procedure. Maximal barrier sterile technique utilized including caps, mask, sterile gowns, sterile gloves, large sterile drape, hand hygiene, and Betadine prep. Ultrasound survey of the right inguinal region was performed with images stored and sent to PACs, confirming patency of the right common femoral artery. A micropuncture needle was used access the right common femoral artery under ultrasound. With excellent arterial blood flow returned, and an .018 micro wire was passed through the needle, observed enter the abdominal aorta under fluoroscopy. The needle was removed,  and a micropuncture sheath was placed over the wire. The inner dilator and wire were removed, and an 035 Bentson wire was advanced under fluoroscopy into the abdominal aorta. The sheath was removed and a standard 5 [place] vascular sheath was placed. The dilator was removed and the sheath was flushed. Omni Flush catheter was advanced on the Bentson wire into the abdominal aorta. Wire was removed and angiogram was performed. Omni Flush catheter was used direct a Bentson wire into the left iliac system. Omni Flush catheter was removed. C2 cobra catheter was advanced on the Bentson wire. Angiogram was performed. Given the laterally directed position of the Cobra catheter we elected to exchange for  a Kumpe the catheter. The exchange was made on a Bentson wire. The Bentson wire was directed towards the inferior epigastric artery. Once the tip of the catheter was directed medially towards the artery and angiogram confirmed the location, STC Renegade catheter was advanced on an 014 fathom wire into the inferior epigastric artery, and then used to select the replaced prostatic artery from the replaced operator artery. Once we positioned the microcatheter beyond the ostium of the operator artery, angiogram was performed. We then performed calibrated bead embolization with Hydrospheres . Ten beat stasis was achieved. Coil embolization was then performed with 2 interlock coils. Final angiogram was performed. The Kumpe the catheter was then exchanged for a C2 Cobra catheter. Waltman loop was formed with the Bentson wire and then used to select the ipsilateral iliac system. Catheter was position into the ostium of the internal iliac artery. Angiogram was performed. Angiogram was performed in multiple obliquities over time. The majority of the fluoroscopy time was on this right side, and we used multiple obliquities and multiple catheter and wire combinations in order to attempt selecting the right prostate artery. This included the [facility] Renegade catheter, straight tip, angled tip, double angle 018 glide wire, 014 synchro soft, 014 fathom wire. Ultimately it seems that the atherosclerotic plaque at the origin of the takeoff was obstructing from this angle approach. We ultimately withdrew at 50 minutes of fluoro time. All catheters wires were withdrawn. Celt was deployed for hemostasis. Patient tolerated the procedure well and remained hemodynamically stable throughout. No complications were encountered and no significant blood loss. IMPRESSION: Status post ultrasound guided access right common femoral artery for pelvic angiogram, combination calibrated bead and coil embolization of the left prostatic artery to  stasis, and failed right prostatic artery embolization. Celt for hemostasis Signed, Yvone Neu. Miachel Roux, RPVI [facility] Inter[facility] Radiology Specialists [facility] Radiology Electronically Signed   By: Gilmer Mor D.O.   On: 10/27/2023 13:54   IR Angiogram Selective Each Additional Vessel  Result Date: 10/27/2023 INDICATION: 77 year old male referred for prostate artery embolization for hematuria EXAM: ULTRASOUND-GUIDED ACCESS RIGHT COMMON FEMORAL ARTERY PELVIC ANGIOGRAM SUPER SELECTIVE CATHETER PLACEMENT INTO THE LEFT PROSTATIC ARTERY, LEFT INFERIOR EPIGASTRIC ARTERY, RIGHT INTERNAL ILIAC ARTERY DIVISION BRANCHES INCLUDING ANTERIOR DIVISION, INFERIOR GLUTEAL ARTERY EMBOLIZATION WITH CALIBRATED BEADS AND COILS OF LEFT PROSTATIC ARTERY CELT FOR HEMOSTASIS MEDICATIONS: 2 g Rocephin. The antibiotic was administered within 1 hour of the procedure ANESTHESIA/SEDATION: Moderate (conscious) sedation was employed during this procedure. A total of Versed 2.5 mg and Fentanyl 100 mcg was administered intravenously by the radiology nurse. Total intra-service moderate Sedation Time: 143 minutes. The patient's level of consciousness and vital signs were monitored continuously by radiology nursing throughout the procedure under my direct supervision. CONTRAST:  100 cc FLUOROSCOPY: Radiation Exposure Index (as provided by the  fluoroscopic device): 3,613 mGy Kerma COMPLICATIONS: None PROCEDURE: Informed consent was obtained from the patient following explanation of the procedure, risks, benefits and alternatives. The patient understands, agrees and consents for the procedure. All questions were addressed. A time out was performed prior to the initiation of the procedure. Maximal barrier sterile technique utilized including caps, mask, sterile gowns, sterile gloves, large sterile drape, hand hygiene, and Betadine prep. Ultrasound survey of the right inguinal region was performed with images stored and sent to PACs,  confirming patency of the right common femoral artery. A micropuncture needle was used access the right common femoral artery under ultrasound. With excellent arterial blood flow returned, and an .018 micro wire was passed through the needle, observed enter the abdominal aorta under fluoroscopy. The needle was removed, and a micropuncture sheath was placed over the wire. The inner dilator and wire were removed, and an 035 Bentson wire was advanced under fluoroscopy into the abdominal aorta. The sheath was removed and a standard 5 [place] vascular sheath was placed. The dilator was removed and the sheath was flushed. Omni Flush catheter was advanced on the Bentson wire into the abdominal aorta. Wire was removed and angiogram was performed. Omni Flush catheter was used direct a Bentson wire into the left iliac system. Omni Flush catheter was removed. C2 cobra catheter was advanced on the Bentson wire. Angiogram was performed. Given the laterally directed position of the Cobra catheter we elected to exchange for a Kumpe the catheter. The exchange was made on a Bentson wire. The Bentson wire was directed towards the inferior epigastric artery. Once the tip of the catheter was directed medially towards the artery and angiogram confirmed the location, STC Renegade catheter was advanced on an 014 fathom wire into the inferior epigastric artery, and then used to select the replaced prostatic artery from the replaced operator artery. Once we positioned the microcatheter beyond the ostium of the operator artery, angiogram was performed. We then performed calibrated bead embolization with Hydrospheres . Ten beat stasis was achieved. Coil embolization was then performed with 2 interlock coils. Final angiogram was performed. The Kumpe the catheter was then exchanged for a C2 Cobra catheter. Waltman loop was formed with the Bentson wire and then used to select the ipsilateral iliac system. Catheter was position into the  ostium of the internal iliac artery. Angiogram was performed. Angiogram was performed in multiple obliquities over time. The majority of the fluoroscopy time was on this right side, and we used multiple obliquities and multiple catheter and wire combinations in order to attempt selecting the right prostate artery. This included the [facility] Renegade catheter, straight tip, angled tip, double angle 018 glide wire, 014 synchro soft, 014 fathom wire. Ultimately it seems that the atherosclerotic plaque at the origin of the takeoff was obstructing from this angle approach. We ultimately withdrew at 50 minutes of fluoro time. All catheters wires were withdrawn. Celt was deployed for hemostasis. Patient tolerated the procedure well and remained hemodynamically stable throughout. No complications were encountered and no significant blood loss. IMPRESSION: Status post ultrasound guided access right common femoral artery for pelvic angiogram, combination calibrated bead and coil embolization of the left prostatic artery to stasis, and failed right prostatic artery embolization. Celt for hemostasis Signed, Yvone Neu. Miachel Roux, RPVI Vascular and Interventional Radiology Specialists [facility] Radiology Electronically Signed   By: Gilmer Mor D.O.   On: 10/27/2023 13:54   IR Angiogram Selective Each Additional Vessel  Result Date: 10/27/2023 INDICATION: 77 year old male referred  for prostate artery embolization for hematuria EXAM: ULTRASOUND-GUIDED ACCESS RIGHT COMMON FEMORAL ARTERY PELVIC ANGIOGRAM SUPER SELECTIVE CATHETER PLACEMENT INTO THE LEFT PROSTATIC ARTERY, LEFT INFERIOR EPIGASTRIC ARTERY, RIGHT INTERNAL ILIAC ARTERY DIVISION BRANCHES INCLUDING ANTERIOR DIVISION, INFERIOR GLUTEAL ARTERY EMBOLIZATION WITH CALIBRATED BEADS AND COILS OF LEFT PROSTATIC ARTERY CELT FOR HEMOSTASIS MEDICATIONS: 2 g Rocephin. The antibiotic was administered within 1 hour of the procedure ANESTHESIA/SEDATION: Moderate (conscious) sedation  was employed during this procedure. A total of Versed 2.5 mg and Fentanyl 100 mcg was administered intravenously by the radiology nurse. Total intra-service moderate Sedation Time: 143 minutes. The patient's level of consciousness and vital signs were monitored continuously by radiology nursing throughout the procedure under my direct supervision. CONTRAST:  100 cc FLUOROSCOPY: Radiation Exposure Index (as provided by the fluoroscopic device): 3,613 mGy Kerma COMPLICATIONS: None PROCEDURE: Informed consent was obtained from the patient following explanation of the procedure, risks, benefits and alternatives. The patient understands, agrees and consents for the procedure. All questions were addressed. A time out was performed prior to the initiation of the procedure. Maximal barrier sterile technique utilized including caps, mask, sterile gowns, sterile gloves, large sterile drape, hand hygiene, and Betadine prep. Ultrasound survey of the right inguinal region was performed with images stored and sent to PACs, confirming patency of the right common femoral artery. A micropuncture needle was used access the right common femoral artery under ultrasound. With excellent arterial blood flow returned, and an .018 micro wire was passed through the needle, observed enter the abdominal aorta under fluoroscopy. The needle was removed, and a micropuncture sheath was placed over the wire. The inner dilator and wire were removed, and an 035 Bentson wire was advanced under fluoroscopy into the abdominal aorta. The sheath was removed and a standard 5 [place] vascular sheath was placed. The dilator was removed and the sheath was flushed. Omni Flush catheter was advanced on the Bentson wire into the abdominal aorta. Wire was removed and angiogram was performed. Omni Flush catheter was used direct a Bentson wire into the left iliac system. Omni Flush catheter was removed. C2 cobra catheter was advanced on the Bentson wire. Angiogram  was performed. Given the laterally directed position of the Cobra catheter we elected to exchange for a Kumpe the catheter. The exchange was made on a Bentson wire. The Bentson wire was directed towards the inferior epigastric artery. Once the tip of the catheter was directed medially towards the artery and angiogram confirmed the location, STC Renegade catheter was advanced on an 014 fathom wire into the inferior epigastric artery, and then used to select the replaced prostatic artery from the replaced operator artery. Once we positioned the microcatheter beyond the ostium of the operator artery, angiogram was performed. We then performed calibrated bead embolization with Hydrospheres . Ten beat stasis was achieved. Coil embolization was then performed with 2 interlock coils. Final angiogram was performed. The Kumpe the catheter was then exchanged for a C2 Cobra catheter. Waltman loop was formed with the Bentson wire and then used to select the ipsilateral iliac system. Catheter was position into the ostium of the internal iliac artery. Angiogram was performed. Angiogram was performed in multiple obliquities over time. The majority of the fluoroscopy time was on this right side, and we used multiple obliquities and multiple catheter and wire combinations in order to attempt selecting the right prostate artery. This included the [facility] Renegade catheter, straight tip, angled tip, double angle 018 glide wire, 014 synchro soft, 014 fathom wire. Ultimately it seems  that the atherosclerotic plaque at the origin of the takeoff was obstructing from this angle approach. We ultimately withdrew at 50 minutes of fluoro time. All catheters wires were withdrawn. Celt was deployed for hemostasis. Patient tolerated the procedure well and remained hemodynamically stable throughout. No complications were encountered and no significant blood loss. IMPRESSION: Status post ultrasound guided access right common femoral artery for  pelvic angiogram, combination calibrated bead and coil embolization of the left prostatic artery to stasis, and failed right prostatic artery embolization. Celt for hemostasis Signed, Yvone Neu. Miachel Roux, RPVI Vascular and Interventional Radiology Specialists [facility] Radiology Electronically Signed   By: Gilmer Mor D.O.   On: 10/27/2023 13:54    Cardiac Studies   TTE 10/03/2023 1. Left ventricular ejection fraction, by estimation, is 55 to 60%. The  left ventricle has normal function. The left ventricle has no regional  wall motion abnormalities. There is mild left ventricular hypertrophy.  Left ventricular diastolic parameters  are indeterminate. Elevated left atrial pressure.   2. Right ventricular systolic function is normal. The right ventricular size is normal. There is mildly elevated pulmonary artery systolic pressure.   3. Left atrial size was moderately dilated.   4. Right atrial size was moderately dilated.   5. The mitral valve is abnormal. Mild to moderate mitral valve regurgitation. No evidence of mitral stenosis.   6. The tricuspid valve is abnormal.   7. The aortic valve was not well visualized. There is severe calcifcation of the aortic valve. There is severe thickening of the aortic valve.  Aortic valve regurgitation is moderate to severe. Severe aortic valve stenosis. Severe aortic stenosis is  present. Aortic valve mean gradient measures 42.0 mmHg. Aortic valve peak gradient measures 69.2 mmHg. Aortic valve area, by VTI measures 0.89 cm.   8. The inferior vena cava is dilated in size with >50% respiratory variability, suggesting right atrial pressure of 8 mmHg.    LHC 10/09/2023   Ost LM lesion is 50% stenosed.   1.  Moderate left main stenosis with an RFR of 0.83. 2.  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 3.  Fick cardiac output of 6.5 L/min and Fick cardiac index of 3.8 L/min/m with the following hemodynamics:             Right atrial  pressure mean of 7             Right ventricular pressure 42/-2 with an end-diastolic pressure of 8 mmHg             Wedge pressure mean of 14 mmHg with V waves to 23 mmHg             PA pressure of 38/16 with a mean of 24 mmHg             PVR of 1.5 Woods units             PA pulsatility index of 3.1   Recommendation: Continue evaluation for aortic valve intervention.  Will obtain TAVR protocol CTA tomorrow.  Patient Profile     77 y.o. 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   Assessment & Plan    CAD- 50% LM CP- demand ischemia His prior EKG noted ST depressions with minimal elevation in aVR, know history of LM disease.  Unable to tolerate dual antiplatelet therapy due to hematuria and therefore tentatively scheduled for CABG/aortic valve replacement on November 02, 2023.  Spoke to Dr. Cliffton Asters to make them aware the patient is in house.  Likely to be seen tomorrow. On presentation patient had anginal chest pain and shortness of breath likely secondary to demand ischemia as hemoglobin was 5.4. Status post blood transfusion and status post left prostate artery embolization with IR on 10/27/2023. Morning hemoglobin remains a stable.  And clinically denies anginal chest pain. Antiplatelets and AC are on hold secondary to hematuria Continue Crestor 40 mg daily Monitor for now.  HFpEF Clinically euvolemic. Blood pressures remain soft.  Will focus on volume management. Currently on Lasix 20 mg IV push daily for volume management. Will hold off ACE and ARB inhibitors secondary to upcoming bypass surgery. Will hold SGLT2 inhibitors for the same reasons. Beta-blockers were held secondary to soft blood pressures initially in the setting of acute blood loss anemia.  Severe AS Tentatively scheduled for aortic valve  replacement November 02, 2023.  Bladder outlet obstruction Hematuria Acute blood loss anemia Status post blood transfusions and left prostate artery embolization with IR on 10/27/2023 No evidence of gross hematuria during morning rounds. Hemoglobin this morning >8 g/dL Recommend hemoglobin greater than 8 Continuous bladder irrigation Urology following.  Time Spent Directly with Patient:  I have spent a total of 35 minutes with the patient reviewing pertinent hospital notes, telemetry, speaking to CT surgery Dr. Cliffton Asters, labs and examining the patient as well as establishing an assessment and plan that was discussed personally with the patient.  > 50% of time was spent in direct patient care.    For questions or updates, please contact [facility] Please consult [email or web address] for contact info under    Signed, Tessa Lerner, DO, [facility]  [facility]  [address] [place], [place] [address] 12:17 PM 10/29/23

## 2023-10-30 ENCOUNTER — Inpatient Hospital Stay (HOSPITAL_COMMUNITY): Payer: Medicare Other

## 2023-10-30 ENCOUNTER — Other Ambulatory Visit: Payer: Self-pay | Admitting: *Deleted

## 2023-10-30 DIAGNOSIS — N138 Other obstructive and reflux uropathy: Secondary | ICD-10-CM

## 2023-10-30 DIAGNOSIS — D62 Acute posthemorrhagic anemia: Secondary | ICD-10-CM | POA: Diagnosis not present

## 2023-10-30 DIAGNOSIS — I214 Non-ST elevation (NSTEMI) myocardial infarction: Secondary | ICD-10-CM | POA: Diagnosis not present

## 2023-10-30 DIAGNOSIS — I5031 Acute diastolic (congestive) heart failure: Secondary | ICD-10-CM | POA: Diagnosis not present

## 2023-10-30 DIAGNOSIS — I2510 Atherosclerotic heart disease of native coronary artery without angina pectoris: Secondary | ICD-10-CM | POA: Diagnosis not present

## 2023-10-30 DIAGNOSIS — N401 Enlarged prostate with lower urinary tract symptoms: Secondary | ICD-10-CM

## 2023-10-30 DIAGNOSIS — I10 Essential (primary) hypertension: Secondary | ICD-10-CM

## 2023-10-30 DIAGNOSIS — Z01810 Encounter for preprocedural cardiovascular examination: Secondary | ICD-10-CM

## 2023-10-30 DIAGNOSIS — I350 Nonrheumatic aortic (valve) stenosis: Secondary | ICD-10-CM | POA: Diagnosis not present

## 2023-10-30 DIAGNOSIS — D649 Anemia, unspecified: Secondary | ICD-10-CM | POA: Diagnosis not present

## 2023-10-30 LAB — BASIC METABOLIC PANEL
Anion gap: 6 (ref 5–15)
BUN: 14 mg/dL (ref 8–23)
CO2: 26 mmol/L (ref 22–32)
Calcium: 8.2 mg/dL — ABNORMAL LOW (ref 8.9–10.3)
Chloride: 102 mmol/L (ref 98–111)
Creatinine, Ser: 0.8 mg/dL (ref 0.61–1.24)
GFR, Estimated: >60 mL/min (ref >60)
Glucose, Bld: 103 mg/dL — ABNORMAL HIGH (ref 70–99)
Potassium: 4.4 mmol/L (ref 3.5–5.1)
Sodium: 134 mmol/L — ABNORMAL LOW (ref 135–145)

## 2023-10-30 LAB — CBC
HCT: 26.7 % — ABNORMAL LOW (ref 39.0–52.0)
Hemoglobin: 8.3 g/dL — ABNORMAL LOW (ref 13.0–17.0)
MCH: 28.2 pg (ref 26.0–34.0)
MCHC: 31.1 g/dL (ref 30.0–36.0)
MCV: 90.8 fL (ref 80.0–100.0)
Platelets: 197 10*3/uL (ref 150–400)
RBC: 2.94 MIL/uL — ABNORMAL LOW (ref 4.22–5.81)
RDW: 14.7 % (ref 11.5–15.5)
WBC: 14.2 10*3/uL — ABNORMAL HIGH (ref 4.0–10.5)
nRBC: 0 % (ref 0.0–0.2)

## 2023-10-30 LAB — MAGNESIUM: Magnesium: 2.2 mg/dL (ref 1.7–2.4)

## 2023-10-30 NOTE — Progress Notes (Signed)
Rounding Note    Patient Name: Kyle Matthews Date of Encounter: 10/30/2023  [facility] Cardiologist: Maisie Fus, MD   Subjective  Interval history: Resting in bed comfortably. No chest pain. Wife at bedside. No events overnight  Inpatient Medications    Scheduled Meds:  Chlorhexidine Gluconate Cloth  6 each Topical Daily   finasteride  5 mg Oral QPM   furosemide  20 mg Intravenous Daily   pantoprazole  40 mg Oral Daily   rosuvastatin  40 mg Oral Daily   Continuous Infusions:  sodium chloride irrigation      PRN Meds: acetaminophen **OR** acetaminophen, hydrALAZINE, hyoscyamine, ipratropium-albuterol, melatonin, metoprolol tartrate, morphine injection, ondansetron (ZOFRAN) IV, mouth rinse, oxyCODONE, senna-docusate   Vital Signs    Vitals:   10/29/23 2000 10/30/23 0030 10/30/23 0400 10/30/23 0728  BP: (!) 106/55 (!) 111/59 (!) 119/57 (!) 128/59  Pulse: 86  79 89  Resp: 15 16 16 18   Temp: 98.4 F (36.9 C) 98.5 F (36.9 C) 98.5 F (36.9 C)   TempSrc: Oral Oral Oral   SpO2: 94% 92% 94% 96%  Weight:   65.8 kg   Height:        Intake/Output Summary (Last 24 hours) at 10/30/2023 1052 Last data filed at 10/30/2023 1036 Gross per 24 hour  Intake 57846 ml  Output 18550 ml  Net -3550 ml      10/30/2023    4:00 AM 10/29/2023    4:45 AM 10/28/2023    6:25 AM  Last 3 Weights  Weight (lbs) 145 lb 1.6 oz 145 lb 1 oz 148 lb 9.4 oz  Weight (kg) 65.817 kg 65.8 kg 67.4 kg      Telemetry    Sinus rhythm without ectopy.- Personally Reviewed  ECG  No new  Initial Sinus rhythm with diffuse ST depression and mild aVR elevation- Personally Reviewed  Physical Exam   Vitals:   10/30/23 0400 10/30/23 0728  BP: (!) 119/57 (!) 128/59  Pulse: 79 89  Resp: 16 18  Temp: 98.5 F (36.9 C)   SpO2: 94% 96%    GEN:  Hemodynamically stable, no acute distress Neck: No JVP. Cardiac: RRR, SEM RUSB that radiates to the apex Respiratory: nl wob, no wheezes  rales or rhonchi's.   GI: Soft, nontender, nondistended. GU: Continuous bladder irrigation MS: No significant lower extremity, warm to touch Neuro:  Nonfocal  Psych: Normal affect   Labs    High Sensitivity Troponin:   Recent Labs  Lab 10/03/23 1521 10/04/23 0505 10/23/23 0129 10/23/23 0740 10/23/23 1700  TROPONINIHS 4,814* 3,357* 369* 482* 1,234*     Chemistry Recent Labs  Lab 10/24/23 0556 10/25/23 0401 10/26/23 0341 10/28/23 0242 10/29/23 0259 10/30/23 0234  NA 135 135   < > 136 134* 134*  K 4.5 3.6   < > 3.9 3.6 4.4  CL 102 103   < > 102 102 102  CO2 25 26   < > 27 24 26   GLUCOSE 110* 103*   < > 111* 111* 103*  BUN 27* 29*   < > 18 12 14   CREATININE 1.36* 1.03   < > 0.85 0.73 0.80  CALCIUM 8.3* 7.8*   < > 8.3* 8.1* 8.2*  MG 1.9 2.2   < > 2.0 2.0 2.2  PROT 5.4* 4.6*  --   --   --   --   ALBUMIN 2.6* 2.1*  --   --   --   --  AST 20 12*  --   --   --   --   ALT 17 13  --   --   --   --   ALKPHOS 40 39  --   --   --   --   BILITOT 0.7 0.7  --   --   --   --   GFRNONAA 54* >60   < > >60 >60 >60  ANIONGAP 8 6   < > 7 8 6    < > = values in this interval not displayed.    Lipids  No results for input(s): "CHOL", "TRIG", "HDL", "LABVLDL", "LDLCALC", "CHOLHDL" in the last 168 hours.   Hematology Recent Labs  Lab 10/28/23 0242 10/29/23 0259 10/30/23 0234  WBC 7.0 12.4* 14.2*  RBC 2.99* 2.97* 2.94*  HGB 8.6* 8.4* 8.3*  HCT 26.9* 26.4* 26.7*  MCV 90.0 88.9 90.8  MCH 28.8 28.3 28.2  MCHC 32.0 31.8 31.1  RDW 14.7 14.6 14.7  PLT 193 194 197   Thyroid No results for input(s): "TSH", "FREET4" in the last 168 hours.  BNPNo results for input(s): "BNP", "PROBNP" in the last 168 hours.  DDimer No results for input(s): "DDIMER" in the last 168 hours.   Radiology    No results found.  Cardiac Studies   TTE 10/03/2023 1. Left ventricular ejection fraction, by estimation, is 55 to 60%. The  left ventricle has normal function. The left ventricle has no regional   wall motion abnormalities. There is mild left ventricular hypertrophy.  Left ventricular diastolic parameters  are indeterminate. Elevated left atrial pressure.   2. Right ventricular systolic function is normal. The right ventricular size is normal. There is mildly elevated pulmonary artery systolic pressure.   3. Left atrial size was moderately dilated.   4. Right atrial size was moderately dilated.   5. The mitral valve is abnormal. Mild to moderate mitral valve regurgitation. No evidence of mitral stenosis.   6. The tricuspid valve is abnormal.   7. The aortic valve was not well visualized. There is severe calcifcation of the aortic valve. There is severe thickening of the aortic valve.  Aortic valve regurgitation is moderate to severe. Severe aortic valve stenosis. Severe aortic stenosis is  present. Aortic valve mean gradient measures 42.0 mmHg. Aortic valve peak gradient measures 69.2 mmHg. Aortic valve area, by VTI measures 0.89 cm.   8. The inferior vena cava is dilated in size with >50% respiratory variability, suggesting right atrial pressure of 8 mmHg.    LHC 10/09/2023   Ost LM lesion is 50% stenosed.   1.  Moderate left main stenosis with an RFR of 0.83. 2.  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 3.  Fick cardiac output of 6.5 L/min and Fick cardiac index of 3.8 L/min/m with the following hemodynamics:             Right atrial pressure mean of 7             Right ventricular pressure 42/-2 with an end-diastolic pressure of 8 mmHg             Wedge pressure mean of 14 mmHg with V waves to 23 mmHg             PA pressure of 38/16 with a mean of 24 mmHg             PVR of 1.5 Woods units  PA pulsatility index of 3.1   Recommendation: Continue evaluation for aortic valve intervention.  Will obtain TAVR protocol CTA tomorrow.  Patient Profile     77 y.o. male with a hx of severe AS, mod-severe AI, CAD (50% LM), HFpEF, HTN, BPH with hematuria who  presents for substernal chest pain with diffuse ST depressions on EKG in the setting of hematuria and severe anemia (Hb 5.4).   Assessment & Plan    CAD- 50% LM CP- demand ischemia Presented to the hospital with hemoglobin of 5.4 g/dL gross hematuria, and chest pain.  EKG noted ST depressions with elevation in aVR. Acute chest pain and precipitated by acute severe symptomatic. Patient has had of blood transfusions as well as embolization of the left side artery by interventional radiology. Hemoglobin remains relatively stable. Clinically denies anginal chest pain this morning. Reached out to CT surgery yesterday with regards to timing for CABG/aortic valve replacement. Antiplatelets and anticoagulation has been held secondary to hematuria  Continue Crestor 40 mg daily Monitor for now.  HFpEF Clinically euvolemic. Blood pressures have now normalized.   Will focus on volume management. Currently IV Lasix.  Will hold off ACE and ARB inhibitors secondary to upcoming bypass surgery. Will hold SGLT2 inhibitors for the same reasons. Beta-blockers were held secondary to soft blood pressures initially in the setting of acute blood loss anemia. Medical therapy will need to be reconsidered if patient is going to discharged CABG surgery.  Severe AS Tentatively scheduled for aortic valve replacement November 02, 2023.  Bladder outlet obstruction Hematuria Acute blood loss anemia Status post blood transfusions and left prostate artery embolization with IR on 10/27/2023 No evidence of gross hematuria during morning rounds. Hemoglobin this morning >8 g/dL Recommend hemoglobin greater than 8 Continuous bladder irrigation Urology following.  Time Spent Directly with Patient:  I have spent a total of 25 minutes with the patient reviewing pertinent hospital notes, telemetry, speaking to CT surgery Dr. Cliffton Asters, labs and examining the patient as well as establishing an assessment and plan that  was discussed personally with the patient.  > 50% of time was spent in direct patient care.    For questions or updates, please contact [facility] Please consult [email or web address] for contact info under    Signed, Tessa Lerner, DO, [facility]  [facility]  [address] [place], [place] [address] 10:52 AM 10/30/23

## 2023-10-30 NOTE — Progress Notes (Signed)
     Subjective: I met with Kyle Matthews and his wife again this morning.   He is in good spirits but has been concerned about discharging home.  He would like to stay in hospital to complete his CTA and CT surgery the following day.  Clamped with clear irrigant in tubing.  Objective: Vital signs in last 24 hours: Temp:  [97.9 F (36.6 C)-98.5 F (36.9 C)] 98.5 F (36.9 C) (11/11 0400) Pulse Rate:  [77-89] 77 (11/11 1211) Resp:  [15-18] 18 (11/11 1211) BP: (103-128)/(52-59) 113/59 (11/11 1211) SpO2:  [92 %-96 %] 95 % (11/11 1211) Weight:  [65.8 kg] 65.8 kg (11/11 0400)  Assessment/Plan:  Intake #ABLA #Hematuria    Loading dose of Firmagon provided.  Will continue ADT through to prostate surgery in an effort to reduce prostate size, vascularity, and bleeding.    Foley catheter for three-way 50f hematuria coud.  Continue CBI in an effort to prevent clot obstruction, despite prostatic source.  PAE performed on 10/27/2023.  Unfortunately only left prostatic artery could be embolized.  Thankfully, it appeared to be the source for the bleeding in question.  He has remained with frank hematuria for the last 48 hours.   120g prostate with large median lobe.  Bladder outlet obstruction occurred back in August, prior to any instance of hematuria.  The prostatic artery embolization and Deborra Medina will eventually have an effect on the prostate tissue and may reduce it's size somewhat.  However considering the very large size of his prostate I doubt it will be enough for normal urination without surgical intervention.  Foley should stay in place through his surgery and recovery.  CBI clamped                                                             11/10 0701 - 11/11 0700 In: [address]  Out: 18475 [Urine:18475]  Intake/Output this shift: Total I/O In: -  Out: 2575 [Urine:2575]  Physical Exam:  General: Alert and oriented CV: No cyanosis Lungs: equal chest rise Gu: Foley in place with clear  irrigant.  CBI clamped on rounds.  Lab Results: Recent Labs    10/28/23 0242 10/29/23 0259 10/30/23 0234  HGB 8.6* 8.4* 8.3*  HCT 26.9* 26.4* 26.7*   BMET Recent Labs    10/29/23 0259 10/30/23 0234  NA 134* 134*  K 3.6 4.4  CL 102 102  CO2 24 26  GLUCOSE 111* 103*  BUN 12 14  CREATININE 0.73 0.80  CALCIUM 8.1* 8.2*     Studies/Results: No results found.    LOS: 7 days   Elmon Kirschner, NP [facility] Pager: [phone]  10/30/2023, 12:43 PM

## 2023-10-30 NOTE — Plan of Care (Signed)

## 2023-10-30 NOTE — Evaluation (Signed)
Physical Therapy Evaluation Patient Details Name: Kyle Matthews MRN: [id number] DOB: Mar 12, 1946 Today's Date: 10/30/2023  History of Present Illness  Pt is 77 yo presenting to [facility] ED 11/4 with chest pain, anemia and hematuria. Tentatively scheduled for valve replacement on 11/14. PMH: CAD, aortic stenosis.  Clinical Impression  Currently pt is presenting at baseline level of functioning at ind with all functional mobility. Discussed pt mobility with nursing staff and stated if nursing staff comfortable pt may ambulate in halls with spouse. Pt is aware of safety precautions and any deficits. Pt does not require an AD. Will continue to follow up after surgery in order to ensure pt has what he needs and continues to move at an ind level without requiring increased assistance following surgery. Pt tolerated treatment session well.               Equipment Recommendations None recommended by PT     Functional Status Assessment Patient has not had a recent decline in their functional status     Precautions / Restrictions Precautions Precautions: Fall Restrictions Weight Bearing Restrictions: No      Mobility  Bed Mobility Overal bed mobility: Independent      Transfers Overall transfer level: Independent      Ambulation/Gait Ambulation/Gait assistance: Independent Gait Distance (Feet): 300 Feet Assistive device: None Gait Pattern/deviations: WFL(Within Functional Limits) Gait velocity: decreased slightly Gait velocity interpretation: >2.62 ft/sec, indicative of community ambulatory   General Gait Details: slight frontal plane sway. Pt states this is normal. Very minimal arm swing, with cueing pt correct and improved.      Balance Overall balance assessment: Independent         Pertinent Vitals/Pain Pain Assessment Pain Assessment: 0-10 Pain Score: 8  Pain Location: bladd spasms Pain Descriptors / Indicators: Sharp Pain Intervention(s): Limited activity within  patient's tolerance, Monitored during session, Premedicated before session    Home Living Family/patient expects to be discharged to:: Private residence Living Arrangements: Spouse/significant other;Children Available Help at Discharge: Family;Available 24 hours/day Type of Home: House Home Access: Stairs to enter Entrance Stairs-Rails: Doctor, general practice of Steps: 1+2+1 step up from drive way to walk way then 2 steps up from drive way to landing   Home Layout: Able to live on main level with bedroom/bathroom Home Equipment: None      Prior Function Prior Level of Function : Independent/Modified Independent;Driving             Mobility Comments: Pt reports ind without AD. ADLs Comments: pt hasn't been driving since hospitalizations in October. Pt states that he was ind with all ADL's. spouse has assisted with socks intermittently. Spouse helped more when he was feeling faint from bleeding.     Extremity/Trunk Assessment   Upper Extremity Assessment Upper Extremity Assessment: Overall WFL for tasks assessed    Lower Extremity Assessment Lower Extremity Assessment: Overall WFL for tasks assessed    Cervical / Trunk Assessment Cervical / Trunk Assessment: Normal  Communication   Communication Communication: No apparent difficulties  Cognition Arousal: Alert Behavior During Therapy: WFL for tasks assessed/performed Overall Cognitive Status: Within Functional Limits for tasks assessed          General Comments General comments (skin integrity, edema, etc.): WNL, spouse present throughout session. Pt and spouse were frustrated initially up on entering room stating they were confused about POC; updated medical team for clarification for pt and spouse to alleviate anxieties surrounding pt care.        Assessment/Plan  PT Assessment Patient does not need any further PT services (will keep on caseload at this time due to surgery)         PT Goals  (Current goals can be found in the Care Plan section)  Acute Rehab PT Goals Patient Stated Goal: to get medically stable and return home PT Goal Formulation: With patient/family Time For Goal Achievement: 11/13/23 Potential to Achieve Goals: Good            AM-PAC PT "6 Clicks" Mobility  Outcome Measure Help needed turning from your back to your side while in a flat bed without using bedrails?: None Help needed moving from lying on your back to sitting on the side of a flat bed without using bedrails?: None Help needed moving to and from a bed to a chair (including a wheelchair)?: None Help needed standing up from a chair using your arms (e.g., wheelchair or bedside chair)?: None Help needed to walk in hospital room?: None Help needed climbing 3-5 steps with a railing? : None 6 Click Score: 24    End of Session Equipment Utilized During Treatment: Gait belt Activity Tolerance: Patient tolerated treatment well Patient left: in bed;with call bell/phone within reach;with family/visitor present Nurse Communication: Mobility status      Time: 1324-4010 PT Time Calculation (min) (ACUTE ONLY): 18 min   Charges:   PT Evaluation $PT Eval Low Complexity: 1 Low   PT General Charges $$ ACUTE PT VISIT: 1 Visit         Harrel Carina, DPT, CLT  Acute Rehabilitation Services Office: [phone] (Secure chat preferred)   Claudia Desanctis 10/30/2023, 5:02 PM

## 2023-10-30 NOTE — Progress Notes (Signed)
Mobility Specialist Progress Note:    10/30/23 1037  Mobility  Activity Ambulated with assistance in hallway  Level of Assistance Contact guard assist, steadying assist  Assistive Device None  Distance Ambulated (ft) 300 ft  Activity Response Tolerated well  Mobility Referral Yes  $Mobility charge 1 Mobility  Mobility Specialist Start Time (ACUTE ONLY) 1000  Mobility Specialist Stop Time (ACUTE ONLY) 1014  Mobility Specialist Time Calculation (min) (ACUTE ONLY) 14 min   Received pt in bed having no complaints and agreeable to mobility.c/o mild bladder spasms. Returned to room w/o fault. Left sitting EOB w/ call bell in reach and all needs met.   Thompson Grayer Mobility Specialist  Please contact vis Secure Chat or  Rehab Office [phone]

## 2023-10-30 NOTE — Progress Notes (Signed)
PROGRESS NOTE An Kyle Matthews  RJJ:[id number] DOB: Apr 25, 1946 DOA: 10/23/2023 PCP: Mila Palmer, MD  Brief Narrative/Hospital Course: 77 y.o. male with medical history significant for severe aortic stenosis, BPH with recurrent gross hematuria, chronic blood loss anemia associated baseline hemoglobin 7.5-10, who is admitted to [facility] on 10/23/2023 with NSTEMI after presenting from home to [facility] ED complaining of chest pain.  He was admitted to the hospital about 2 weeks ago for chest pain and hematuria.  Eventual plan was to challenge him with DAPT for couple of weeks and if tolerates, proceed with TAVR/PCI.Seen by urology but now developed hematuria again-status post multiple PRBC transfusion. S/p  IR  prostate artery embolization 11/8.  Cardiology following Cont PPI     Subjective: Patient seen and examined -resting comfortably wife at the bedside overnight afebrile BP stable labs hemoglobin about the same 8.3 g mild leukocytosis 12>14K Renal function stable   Assessment and Plan: Principal Problem:   NSTEMI (non-ST elevated myocardial infarction) (HCC) Active Problems:   Symptomatic anemia   Severe aortic stenosis   BPH with urinary obstruction   Hyperlipidemia   Chest pain   History of essential hypertension   GERD (gastroesophageal reflux disease)   Acute heart failure with preserved ejection fraction (HFpEF) (HCC)   Atherosclerosis of native coronary artery of native heart without angina pectoris   Pressure injury of skin   Acute blood loss anemia   Benign hypertension  ABLA Hematuria sp PAE 11/8-1 with left prostate artery could be embolized. Bladder outlet obstruction/BPH W/ chronic indwelling Foley catheter: Seen by urology, s/p multiple PRBC transfusions.S/p  IR  prostate artery embolization 11/8-1 left artery could be embolized but thankfully appears to be the source of bleeding. Urine appears clear urology notified planning to clamp CBI.   Severe aortic  stenosis: Tentatively for  AVR 11/14-cardiology team considering doing preop testing as inpatient,  Acute on chronic congestive heart failure with preserved EF CAD-50% LM lesion Chest pain with demand ischemia: Cardiology following. Recently patient was placed on DAPT challenge but did not tolerate therefore discontinued due to hematuria.  Antiplatelet and ACR on hold due to hematuria cardiology planning for near future CABG/TAVR evaluation Continue Crestor, cont lasix per cardio.  Acute hypoxic respiratory failure and shortness of breath: Resolved,    AKI: Resolved.    HTN: BP stable.  GERD: Cont ppi  DVT prophylaxis: SCDs Start: 10/23/23 0358 Code Status:   Code Status: Full Code Family Communication: plan of care discussed with patient/wife at bedside. Patient status is: Inpatient because of hematuria and pending further cardiac intervention Level of care: Progressive   Dispo: The patient is from: home with wife-obtain PT OT evaluation today, patient is hesitant to go home            Anticipated disposition: TBD Objective: Vitals last 24 hrs: Vitals:   10/30/23 0030 10/30/23 0400 10/30/23 0728 10/30/23 1211  BP: (!) 111/59 (!) 119/57 (!) 128/59 (!) 113/59  Pulse:  79 89 77  Resp: 16 16 18 18   Temp: 98.5 F (36.9 C) 98.5 F (36.9 C)  98.5 F (36.9 C)  TempSrc: Oral Oral  Oral  SpO2: 92% 94% 96% 95%  Weight:  65.8 kg    Height:       Weight change: 0.017 kg  Physical Examination: General exam: alert awake, older than stated age HEENT:Oral mucosa moist, Ear/Nose WNL grossly Respiratory system: bilaterally clear BS, no use of accessory muscle Cardiovascular system: S1 & S2 +, No JVD. Gastrointestinal  system: Abdomen soft,NT,ND, BS+ Nervous System:Alert, awake, moving extremities. Extremities: LE edema neg,distal peripheral pulses palpable.  Skin: No rashes,no icterus. MSK: Normal muscle bulk,tone, power  Medications reviewed:  Scheduled Meds:  Chlorhexidine  Gluconate Cloth  6 each Topical Daily   finasteride  5 mg Oral QPM   furosemide  20 mg Intravenous Daily   pantoprazole  40 mg Oral Daily   rosuvastatin  40 mg Oral Daily   Continuous Infusions:  sodium chloride irrigation     Diet Order             Diet regular Room service appropriate? Yes; Fluid consistency: Thin  Diet effective now                  Intake/Output Summary (Last 24 hours) at 10/30/2023 1307 Last data filed at 10/30/2023 1150 Gross per 24 hour  Intake 66440 ml  Output 16300 ml  Net -1300 ml   Net IO Since Admission: -14,278.16 mL [10/30/23 1307]  Wt Readings from Last 3 Encounters:  10/30/23 65.8 kg  10/18/23 67.7 kg  10/15/23 66.7 kg     Unresulted Labs (From admission, onward)     Start     Ordered   10/26/23 0500  Basic metabolic panel  Daily,   R     Question:  Specimen collection method  Answer:  Lab=Lab collect   10/25/23 0903   10/26/23 0500  CBC  Daily,   R     Question:  Specimen collection method  Answer:  Lab=Lab collect   10/25/23 0903   10/26/23 0500  Magnesium  Daily,   R     Question:  Specimen collection method  Answer:  Lab=Lab collect   10/25/23 0903          Data Reviewed: I have personally reviewed following labs and imaging studies CBC: Recent Labs  Lab 10/23/23 1700 10/24/23 0556 10/25/23 0401 10/25/23 0755 10/26/23 0341 10/27/23 0357 10/28/23 0242 10/29/23 0259 10/30/23 0234  WBC 10.0 10.8* 8.1  --  6.8 4.8 7.0 12.4* 14.2*  NEUTROABS 7.3 8.2* 6.3  --   --   --   --   --   --   HGB 8.4* 8.7* 7.2*   < > 7.8* 8.4* 8.6* 8.4* 8.3*  HCT 26.0* 26.7* 22.5*   < > 24.1* 25.9* 26.9* 26.4* 26.7*  MCV 90.9 91.1 88.9  --  88.6 90.2 90.0 88.9 90.8  PLT 163 177 164  --  181 180 193 194 197   < > = values in this interval not displayed.   Basic Metabolic Panel: Recent Labs  Lab 10/24/23 0556 10/25/23 0401 10/26/23 0341 10/27/23 0357 10/28/23 0242 10/29/23 0259 10/30/23 0234  NA 135 135 137 138 136 134* 134*  K  4.5 3.6 4.0 3.8 3.9 3.6 4.4  CL 102 103 104 104 102 102 102  CO2 25 26 27 27 27 24 26   GLUCOSE 110* 103* 98 102* 111* 111* 103*  BUN 27* 29* 28* 25* 18 12 14   CREATININE 1.36* 1.03 0.98 1.04 0.85 0.73 0.80  CALCIUM 8.3* 7.8* 7.6* 8.1* 8.3* 8.1* 8.2*  MG 1.9 2.2 2.2 2.3 2.0 2.0 2.2  PHOS 5.7* 4.3  --   --   --   --   --    GFR: Estimated Creatinine Clearance: 64.2 mL/min (by C-G formula based on SCr of 0.8 mg/dL). Liver Function Tests: Recent Labs  Lab 10/24/23 0556 10/25/23 0401  AST 20 12*  ALT 17  13  ALKPHOS 40 39  BILITOT 0.7 0.7  PROT 5.4* 4.6*  ALBUMIN 2.6* 2.1*  No results found for this or any previous visit (from the past 240 hour(s)).  Antimicrobials: Anti-infectives (From admission, onward)    Start     Dose/Rate Route Frequency Ordered Stop   10/27/23 0000  cefTRIAXone (ROCEPHIN) 2 g in sodium chloride 0.9 % 100 mL IVPB        2 g 200 mL/hr over 30 Minutes Intravenous To Radiology 10/26/23 1250 10/27/23 1033      Culture/Microbiology    Component Value Date/Time   SDES URINE, CATHETERIZED 10/15/2023 0518   SPECREQUEST  10/15/2023 0518    NONE Performed at [facility], [address], [place], [place] [address]    CULT 10,000 COLONIES/mL ENTEROBACTER CLOACAE (A) 10/15/2023 0518   REPTSTATUS 10/21/2023 FINAL 10/15/2023 0518  Radiology Studies: No results found.   LOS: 7 days   Lanae Boast, MD [facility]  10/30/2023, 1:07 PM

## 2023-10-31 ENCOUNTER — Other Ambulatory Visit (HOSPITAL_COMMUNITY): Payer: Medicare Other

## 2023-10-31 ENCOUNTER — Ambulatory Visit (HOSPITAL_COMMUNITY): Payer: Medicare Other

## 2023-10-31 DIAGNOSIS — D649 Anemia, unspecified: Secondary | ICD-10-CM | POA: Diagnosis not present

## 2023-10-31 DIAGNOSIS — I350 Nonrheumatic aortic (valve) stenosis: Secondary | ICD-10-CM

## 2023-10-31 DIAGNOSIS — I214 Non-ST elevation (NSTEMI) myocardial infarction: Secondary | ICD-10-CM | POA: Diagnosis not present

## 2023-10-31 DIAGNOSIS — I5031 Acute diastolic (congestive) heart failure: Secondary | ICD-10-CM | POA: Diagnosis not present

## 2023-10-31 DIAGNOSIS — D62 Acute posthemorrhagic anemia: Secondary | ICD-10-CM | POA: Diagnosis not present

## 2023-10-31 DIAGNOSIS — I2510 Atherosclerotic heart disease of native coronary artery without angina pectoris: Secondary | ICD-10-CM | POA: Diagnosis not present

## 2023-10-31 LAB — BASIC METABOLIC PANEL
Anion gap: 7 (ref 5–15)
BUN: 16 mg/dL (ref 8–23)
CO2: 25 mmol/L (ref 22–32)
Calcium: 8.1 mg/dL — ABNORMAL LOW (ref 8.9–10.3)
Chloride: 102 mmol/L (ref 98–111)
Creatinine, Ser: 0.74 mg/dL (ref 0.61–1.24)
GFR, Estimated: >60 mL/min (ref >60)
Glucose, Bld: 105 mg/dL — ABNORMAL HIGH (ref 70–99)
Potassium: 3.4 mmol/L — ABNORMAL LOW (ref 3.5–5.1)
Sodium: 134 mmol/L — ABNORMAL LOW (ref 135–145)

## 2023-10-31 LAB — CBC
HCT: 25.7 % — ABNORMAL LOW (ref 39.0–52.0)
Hemoglobin: 8 g/dL — ABNORMAL LOW (ref 13.0–17.0)
MCH: 27.6 pg (ref 26.0–34.0)
MCHC: 31.1 g/dL (ref 30.0–36.0)
MCV: 88.6 fL (ref 80.0–100.0)
Platelets: 222 10*3/uL (ref 150–400)
RBC: 2.9 MIL/uL — ABNORMAL LOW (ref 4.22–5.81)
RDW: 14.8 % (ref 11.5–15.5)
WBC: 10.4 10*3/uL (ref 4.0–10.5)
nRBC: 0 % (ref 0.0–0.2)

## 2023-10-31 LAB — MAGNESIUM: Magnesium: 2.2 mg/dL (ref 1.7–2.4)

## 2023-10-31 MED ORDER — HYOSCYAMINE SULFATE 0.125 MG SL SUBL
0.2500 mg | SUBLINGUAL_TABLET | SUBLINGUAL | Status: DC | PRN
  Administered 2023-10-31 – 2023-11-07 (×17): 0.25 mg via SUBLINGUAL
  Filled 2023-10-31 (×24): qty 2

## 2023-10-31 MED ORDER — POTASSIUM CHLORIDE CRYS ER 20 MEQ PO TBCR
40.0000 meq | EXTENDED_RELEASE_TABLET | Freq: Once | ORAL | Status: AC
  Administered 2023-10-31: 40 meq via ORAL
  Filled 2023-10-31: qty 4

## 2023-10-31 NOTE — Progress Notes (Signed)
CARDIAC REHAB PHASE I      Pre-op OHS education including OHS booklet, OHS handout, IS use, mobility importance, home needs at discharge and sternal precautions/move in the tube reviewed. All questions and concerns addressed. Will continue to follow. [id number]  Woodroe Chen, RN BSN 10/31/2023 2:25 PM

## 2023-10-31 NOTE — Progress Notes (Signed)
Procedure(s) (LRB): AORTIC VALVE REPLACEMENT (AVR) (N/A) CORONARY ARTERY BYPASS GRAFTING (CABG) (N/A) TRANSESOPHAGEAL ECHOCARDIOGRAM (N/A) Subjective: No complaints. No further hematuria.  Objective: Vital signs in last 24 hours: Temp:  [97.4 F (36.3 C)-99 F (37.2 C)] 99 F (37.2 C) (11/12 0405) Pulse Rate:  [77-87] 77 (11/12 0808) Cardiac Rhythm: Normal sinus rhythm;Other (Comment) (11/12 0734) Resp:  [18] 18 (11/12 0808) BP: (108-122)/(55-67) 115/61 (11/12 0808) SpO2:  [92 %-94 %] 93 % (11/12 0808) Weight:  [66.5 kg] 66.5 kg (11/12 0154)  Hemodynamic parameters for last 24 hours:    Intake/Output from previous day: 11/11 0701 - 11/12 0700 In: 1640 [P.O.:120] Out: 3375 [Urine:3375] Intake/Output this shift: Total I/O In: -  Out: 1250 [Urine:1250]  General appearance: alert and cooperative Neurologic: intact Heart: regular rate and rhythm, 3/6 systolic murmur RSB. Lungs: clear to auscultation bilaterally  Lab Results: Recent Labs    10/30/23 0234 10/31/23 0352  WBC 14.2* 10.4  HGB 8.3* 8.0*  HCT 26.7* 25.7*  PLT 197 222   BMET:  Recent Labs    10/30/23 0234 10/31/23 0352  NA 134* 134*  K 4.4 3.4*  CL 102 102  CO2 26 25  GLUCOSE 103* 105*  BUN 14 16  CREATININE 0.80 0.74  CALCIUM 8.2* 8.1*    PT/INR: No results for input(s): "LABPROT", "INR" in the last 72 hours. ABG    Component Value Date/Time   PHART 7.426 10/09/2023 1038   HCO3 26.8 10/09/2023 1042   TCO2 24 10/23/2023 0131   O2SAT 63 10/09/2023 1042   CBG (last 3)  No results for input(s): "GLUCAP" in the last 72 hours.  Assessment/Plan:  Ostial LM stenosis and severe aortic stenosis.  Planning AVR and CABG on Thursday. I discussed the operative procedure with the patient including alternatives, benefits and risks; including but not limited to bleeding, blood transfusion, infection, stroke, myocardial infarction, graft failure, heart block requiring a permanent pacemaker, organ  dysfunction, and death.  Marisue Ivan understands and agrees to proceed.   LOS: 8 days    Alleen Borne 10/31/2023

## 2023-10-31 NOTE — Plan of Care (Signed)

## 2023-10-31 NOTE — H&P (View-Only) (Signed)
Procedure(s) (LRB): AORTIC VALVE REPLACEMENT (AVR) (N/A) CORONARY ARTERY BYPASS GRAFTING (CABG) (N/A) TRANSESOPHAGEAL ECHOCARDIOGRAM (N/A) Subjective: No complaints. No further hematuria.  Objective: Vital signs in last 24 hours: Temp:  [97.4 F (36.3 C)-99 F (37.2 C)] 99 F (37.2 C) (11/12 0405) Pulse Rate:  [77-87] 77 (11/12 0808) Cardiac Rhythm: Normal sinus rhythm;Other (Comment) (11/12 0734) Resp:  [18] 18 (11/12 0808) BP: (108-122)/(55-67) 115/61 (11/12 0808) SpO2:  [92 %-94 %] 93 % (11/12 0808) Weight:  [66.5 kg] 66.5 kg (11/12 0154)  Hemodynamic parameters for last 24 hours:    Intake/Output from previous day: 11/11 0701 - 11/12 0700 In: 1640 [P.O.:120] Out: 3375 [Urine:3375] Intake/Output this shift: Total I/O In: -  Out: 1250 [Urine:1250]  General appearance: alert and cooperative Neurologic: intact Heart: regular rate and rhythm, 3/6 systolic murmur RSB. Lungs: clear to auscultation bilaterally  Lab Results: Recent Labs    10/30/23 0234 10/31/23 0352  WBC 14.2* 10.4  HGB 8.3* 8.0*  HCT 26.7* 25.7*  PLT 197 222   BMET:  Recent Labs    10/30/23 0234 10/31/23 0352  NA 134* 134*  K 4.4 3.4*  CL 102 102  CO2 26 25  GLUCOSE 103* 105*  BUN 14 16  CREATININE 0.80 0.74  CALCIUM 8.2* 8.1*    PT/INR: No results for input(s): "LABPROT", "INR" in the last 72 hours. ABG    Component Value Date/Time   PHART 7.426 10/09/2023 1038   HCO3 26.8 10/09/2023 1042   TCO2 24 10/23/2023 0131   O2SAT 63 10/09/2023 1042   CBG (last 3)  No results for input(s): "GLUCAP" in the last 72 hours.  Assessment/Plan:  Ostial LM stenosis and severe aortic stenosis.  Planning AVR and CABG on Thursday. I discussed the operative procedure with the patient including alternatives, benefits and risks; including but not limited to bleeding, blood transfusion, infection, stroke, myocardial infarction, graft failure, heart block requiring a permanent pacemaker, organ  dysfunction, and death.  Kyle Matthews understands and agrees to proceed.   LOS: 8 days    Alleen Borne 10/31/2023

## 2023-10-31 NOTE — Progress Notes (Signed)
PROGRESS NOTE Trayson Zenk  ZOX:[id number] DOB: 27-Apr-1946 DOA: 10/23/2023 PCP: Mila Palmer, MD  Brief Narrative/Hospital Course: 77 y.o. male with medical history significant for severe aortic stenosis, BPH with recurrent gross hematuria, chronic blood loss anemia associated baseline hemoglobin 7.5-10, who is admitted to [facility] on 10/23/2023 with NSTEMI after presenting from home to [facility] ED complaining of chest pain.  He was admitted to the hospital about 2 weeks ago for chest pain and hematuria.  Eventual plan was to challenge him with DAPT for couple of weeks and if tolerates, proceed with TAVR/PCI.Seen by urology but now developed hematuria again-status post multiple PRBC transfusion. S/p  IR  prostate artery embolization 11/8.  Cardiology following Cont PPI     Subjective: Patient seen and examined this morning Resting comfortably has been ambulating Foley catheter without any hematuria  Labs with mild hypokalemia leukocytosis resolved  Assessment and Plan: Principal Problem:   NSTEMI (non-ST elevated myocardial infarction) (HCC) Active Problems:   Symptomatic anemia   Severe aortic stenosis   BPH with urinary obstruction   Hyperlipidemia   Chest pain   History of essential hypertension   GERD (gastroesophageal reflux disease)   Acute heart failure with preserved ejection fraction (HFpEF) (HCC)   Atherosclerosis of native coronary artery of native heart without angina pectoris   Pressure injury of skin   Acute blood loss anemia   Benign hypertension  ABLA Hematuria sp PAE 11/8-1 with left prostate artery could be embolized. Bladder outlet obstruction/BPH W/ chronic indwelling Foley catheter: Seen by urology, s/p multiple PRBC transfusions.S/p  IR  prostate artery embolization 11/8-1 left artery could be embolized but thankfully appears to be the source of bleeding. Urine appears clear , CBI off 11/11   Acute on chronic congestive heart failure with preserved  EF CAD-50% LM lesion Chest pain with demand ischemia Severe aortic stenosis: Tentatively for  AVR 11/14-cardiology team doing preop testing  today w/ CT and [place] as inpatient Cardiology following. Recently patient was placed on DAPT challenge but did not tolerate therefore discontinued due to hematuria.  Antiplatelet and ACR on hold . Continue Crestor, cont lasix per cardio.  Acute hypoxic respiratory failure and shortness of breath: Resolved,    AKI: Resolved.    HTN: BP stable.  Continue on oral Lasix.  GERD: Cont ppi  DVT prophylaxis: SCDs Start: 10/23/23 0358 Code Status:   Code Status: Full Code Family Communication: plan of care discussed with patient/wife at bedside. Patient status is: Inpatient because of hematuria and pending further cardiac intervention Level of care: Progressive   Dispo: The patient is from: home with wife-            Anticipated disposition: Awaiting for surgical intervention on Thursday   Objective: Vitals last 24 hrs: Vitals:   10/30/23 2325 10/31/23 0154 10/31/23 0405 10/31/23 0808  BP: 108/67  (!) 122/55 115/61  Pulse: 87  84 77  Resp: 18  18 18   Temp: 98.8 F (37.1 C)  99 F (37.2 C)   TempSrc: Oral  Oral   SpO2: 93%  94% 93%  Weight:  66.5 kg    Height:       Weight change: 0.683 kg  Physical Examination: General exam: alert awake, oriented x3 HEENT:Oral mucosa moist, Ear/Nose WNL grossly Respiratory system: Bilaterally clear BS,no use of accessory muscle Cardiovascular system: S1 & S2 +, No JVD. Gastrointestinal system: Abdomen soft,NT,ND, BS+ Nervous System: Alert, awake, moving all extremities,and following commands. Extremities: LE edema neg,distal peripheral pulses  palpable and warm.  Skin: No rashes,no icterus. MSK: Normal muscle bulk,tone, power  Foley+  Medications reviewed:  Scheduled Meds:  Chlorhexidine Gluconate Cloth  6 each Topical Daily   finasteride  5 mg Oral QPM   furosemide  20 mg Intravenous Daily    pantoprazole  40 mg Oral Daily   rosuvastatin  40 mg Oral Daily   Continuous Infusions:  sodium chloride irrigation     Diet Order             Diet regular Room service appropriate? Yes; Fluid consistency: Thin  Diet effective now                  Intake/Output Summary (Last 24 hours) at 10/31/2023 1127 Last data filed at 10/31/2023 0645 Gross per 24 hour  Intake 1640 ml  Output 1700 ml  Net -60 ml   Net IO Since Admission: -13,438.16 mL [10/31/23 1127]  Wt Readings from Last 3 Encounters:  10/31/23 66.5 kg  10/18/23 67.7 kg  10/15/23 66.7 kg     Unresulted Labs (From admission, onward)     Start     Ordered   10/26/23 0500  Basic metabolic panel  Daily,   R     Question:  Specimen collection method  Answer:  Lab=Lab collect   10/25/23 0903   10/26/23 0500  CBC  Daily,   R     Question:  Specimen collection method  Answer:  Lab=Lab collect   10/25/23 0903   10/26/23 0500  Magnesium  Daily,   R     Question:  Specimen collection method  Answer:  Lab=Lab collect   10/25/23 0903          Data Reviewed: I have personally reviewed following labs and imaging studies CBC: Recent Labs  Lab 10/25/23 0401 10/25/23 0755 10/27/23 0357 10/28/23 0242 10/29/23 0259 10/30/23 0234 10/31/23 0352  WBC 8.1   < > 4.8 7.0 12.4* 14.2* 10.4  NEUTROABS 6.3  --   --   --   --   --   --   HGB 7.2*   < > 8.4* 8.6* 8.4* 8.3* 8.0*  HCT 22.5*   < > 25.9* 26.9* 26.4* 26.7* 25.7*  MCV 88.9   < > 90.2 90.0 88.9 90.8 88.6  PLT 164   < > 180 193 194 197 222   < > = values in this interval not displayed.   Basic Metabolic Panel: Recent Labs  Lab 10/25/23 0401 10/26/23 0341 10/27/23 0357 10/28/23 0242 10/29/23 0259 10/30/23 0234 10/31/23 0352  NA 135   < > 138 136 134* 134* 134*  K 3.6   < > 3.8 3.9 3.6 4.4 3.4*  CL 103   < > 104 102 102 102 102  CO2 26   < > 27 27 24 26 25   GLUCOSE 103*   < > 102* 111* 111* 103* 105*  BUN 29*   < > 25* 18 12 14 16   CREATININE 1.03   < >  1.04 0.85 0.73 0.80 0.74  CALCIUM 7.8*   < > 8.1* 8.3* 8.1* 8.2* 8.1*  MG 2.2   < > 2.3 2.0 2.0 2.2 2.2  PHOS 4.3  --   --   --   --   --   --    < > = values in this interval not displayed.   GFR: Estimated Creatinine Clearance: 64.2 mL/min (by C-G formula based on SCr of 0.74 mg/dL). Liver Function  Tests: Recent Labs  Lab 10/25/23 0401  AST 12*  ALT 13  ALKPHOS 39  BILITOT 0.7  PROT 4.6*  ALBUMIN 2.1*  No results found for this or any previous visit (from the past 240 hour(s)).  Antimicrobials: Anti-infectives (From admission, onward)    Start     Dose/Rate Route Frequency Ordered Stop   10/27/23 0000  cefTRIAXone (ROCEPHIN) 2 g in sodium chloride 0.9 % 100 mL IVPB        2 g 200 mL/hr over 30 Minutes Intravenous To Radiology 10/26/23 1250 10/27/23 1033      Culture/Microbiology    Component Value Date/Time   SDES URINE, CATHETERIZED 10/15/2023 0518   SPECREQUEST  10/15/2023 0518    NONE Performed at [facility], [address] [place], [place] [address]    CULT 10,000 COLONIES/mL ENTEROBACTER CLOACAE (A) 10/15/2023 0518   REPTSTATUS 10/21/2023 FINAL 10/15/2023 0518  Radiology Studies: VAS US DOPPLER PRE CABG  Result Date: 10/30/2023 PREOPERATIVE VASCULAR EVALUATION Patient Name:  HAL BOWERSOCK  Date of Exam:   10/30/2023 Medical Rec #: [id number]   Accession #:    [id number] Date of Birth: Nov 08, 1946    Patient Gender: M Patient Age:   61 years Exam Location:  [facility] Procedure:      VAS US DOPPLER PRE CABG Referring Phys: Evelene Croon --------------------------------------------------------------------------------  Indications:      Pre-CABG. Risk Factors:     Hypertension, hyperlipidemia, current smoker. Comparison Study: No prior exam. Performing Technologist: Fernande Bras  Examination Guidelines: A complete evaluation includes B-mode imaging, spectral Doppler, color Doppler, and power Doppler as needed of all accessible portions of each vessel.  Bilateral testing is considered an integral part of a complete examination. Limited examinations for reoccurring indications may be performed as noted.  Right Carotid Findings: +----------+--------+--------+--------+-------------------------+--------+           PSV cm/sEDV cm/sStenosisDescribe                 Comments +----------+--------+--------+--------+-------------------------+--------+ CCA Prox  83      16                                                +----------+--------+--------+--------+-------------------------+--------+ CCA Distal90      13                                                +----------+--------+--------+--------+-------------------------+--------+ ICA Prox  80      13              heterogenous and calcific         +----------+--------+--------+--------+-------------------------+--------+ ICA Mid   79      23                                                +----------+--------+--------+--------+-------------------------+--------+ ICA Distal67      21                                                +----------+--------+--------+--------+-------------------------+--------+  ECA       73                                                        +----------+--------+--------+--------+-------------------------+--------+ +----------+--------+-------+----------------+------------+           PSV cm/sEDV cmsDescribe        Arm Pressure +----------+--------+-------+----------------+------------+ Subclavian107            Multiphasic, WNL             +----------+--------+-------+----------------+------------+ +---------+--------+--+--------+--+ VertebralPSV cm/s49EDV cm/s18 +---------+--------+--+--------+--+ Left Carotid Findings: +----------+--------+--------+--------+-------------------------+--------+           PSV cm/sEDV cm/sStenosisDescribe                 Comments  +----------+--------+--------+--------+-------------------------+--------+ CCA Prox  67      21                                                +----------+--------+--------+--------+-------------------------+--------+ CCA Distal73      20                                                +----------+--------+--------+--------+-------------------------+--------+ ICA Prox  107     24              calcific and heterogenous         +----------+--------+--------+--------+-------------------------+--------+ ICA Mid   102     24                                                +----------+--------+--------+--------+-------------------------+--------+ ICA Distal78      27                                                +----------+--------+--------+--------+-------------------------+--------+ ECA       70      10                                                +----------+--------+--------+--------+-------------------------+--------+  +----------+--------+--------+----------------+------------+ SubclavianPSV cm/sEDV cm/sDescribe        Arm Pressure +----------+--------+--------+----------------+------------+           117             Multiphasic, WNL             +----------+--------+--------+----------------+------------+ +---------+--------+--+--------+--+ VertebralPSV cm/s58EDV cm/s13 +---------+--------+--+--------+--+  ABI Findings: +------------------+-----+---------+ Rt Pressure (mmHg)IndexWaveform  +------------------+-----+---------+ 105                              +------------------+-----+---------+ 139               1.32 triphasic +------------------+-----+---------+ 140  1.33 triphasic +------------------+-----+---------+ 173               1.65 Abnormal  +------------------+-----+---------+ +------------------+-----+---------+ Lt Pressure (mmHg)IndexWaveform  +------------------+-----+---------+ 105                               +------------------+-----+---------+ 135               1.29 triphasic +------------------+-----+---------+ 138               1.31 triphasic +------------------+-----+---------+ 138               1.31 Normal    +------------------+-----+---------+  Right Doppler Findings: +--------+--------+ Site    Pressure +--------+--------+ [id number]      +--------+--------+  Left Doppler Findings: +--------+--------+ Site    Pressure +--------+--------+ [id number]      +--------+--------+   Summary: Right Carotid: Velocities in the right ICA are consistent with a 1-39% stenosis.                The ECA appears <50% stenosed. Left Carotid: Velocities in the left ICA are consistent with a 1-39% stenosis.               The ECA appears <50% stenosed. Vertebrals:  Bilateral vertebral arteries demonstrate antegrade flow. Subclavians: Normal flow hemodynamics were seen in bilateral subclavian              arteries. Right ABI: Resting right ankle-brachial index is within normal range. The right toe-brachial index is abnormal. Left ABI: Resting left ankle-brachial index is within normal range. The left toe-brachial index is normal.  Electronically signed by Gerarda Fraction on 10/30/2023 at 6:18:35 PM.    Final      LOS: 8 days   Lanae Boast, MD [facility]  10/31/2023, 11:27 AM

## 2023-10-31 NOTE — Progress Notes (Signed)
Rounding Note    Patient Name: Kyle Matthews Date of Encounter: 10/31/2023  [facility] Cardiologist: Maisie Fus, MD   Subjective  Interval history: No events overnight  No gross hematuria No chest pain or Shortness of breath Bladder spasm at times.    Inpatient Medications    Scheduled Meds:  Chlorhexidine Gluconate Cloth  6 each Topical Daily   finasteride  5 mg Oral QPM   furosemide  20 mg Intravenous Daily   pantoprazole  40 mg Oral Daily   rosuvastatin  40 mg Oral Daily   Continuous Infusions:  sodium chloride irrigation      PRN Meds: acetaminophen **OR** acetaminophen, hydrALAZINE, hyoscyamine, ipratropium-albuterol, melatonin, metoprolol tartrate, morphine injection, ondansetron (ZOFRAN) IV, mouth rinse, oxyCODONE, senna-docusate   Vital Signs    Vitals:   10/31/23 0154 10/31/23 0405 10/31/23 0808 10/31/23 1938  BP:  (!) 122/55 115/61 (!) 116/57  Pulse:  84 77 85  Resp:  18 18 18   Temp:  99 F (37.2 C)  98.6 F (37 C)  TempSrc:  Oral  Oral  SpO2:  94% 93% 95%  Weight: 66.5 kg     Height:        Intake/Output Summary (Last 24 hours) at 10/31/2023 2324 Last data filed at 10/31/2023 1227 Gross per 24 hour  Intake 120 ml  Output 1600 ml  Net -1480 ml   Net IO Since Admission: -02,725.16 mL [10/31/23 2325]     10/31/2023    1:54 AM 10/30/2023    4:00 AM 10/29/2023    4:45 AM  Last 3 Weights  Weight (lbs) 146 lb 9.7 oz 145 lb 1.6 oz 145 lb 1 oz  Weight (kg) 66.5 kg 65.817 kg 65.8 kg      Telemetry    Sinus rhythm without ectopy.- Personally Reviewed  ECG  No new  Initial Sinus rhythm with diffuse ST depression and mild aVR elevation- Personally Reviewed  Physical Exam   Vitals:   10/31/23 0808 10/31/23 1938  BP: 115/61 (!) 116/57  Pulse: 77 85  Resp: 18 18  Temp:  98.6 F (37 C)  SpO2: 93% 95%    GEN:  Hemodynamically stable, no acute distress Neck: No JVP. Cardiac: RRR, SEM RUSB that radiates to the  apex Respiratory: nl wob, no wheezes rales or rhonchi's.   GI: Soft, nontender, nondistended. GU: Continuous bladder irrigation MS: No significant lower extremity, warm to touch Neuro:  Nonfocal  Psych: Normal affect   Labs    High Sensitivity Troponin:   Recent Labs  Lab 10/03/23 1521 10/04/23 0505 10/23/23 0129 10/23/23 0740 10/23/23 1700  TROPONINIHS 4,814* 3,357* 369* 482* 1,234*     Chemistry Recent Labs  Lab 10/25/23 0401 10/26/23 0341 10/29/23 0259 10/30/23 0234 10/31/23 0352  NA 135   < > 134* 134* 134*  K 3.6   < > 3.6 4.4 3.4*  CL 103   < > 102 102 102  CO2 26   < > 24 26 25   GLUCOSE 103*   < > 111* 103* 105*  BUN 29*   < > 12 14 16   CREATININE 1.03   < > 0.73 0.80 0.74  CALCIUM 7.8*   < > 8.1* 8.2* 8.1*  MG 2.2   < > 2.0 2.2 2.2  PROT 4.6*  --   --   --   --   ALBUMIN 2.1*  --   --   --   --   AST 12*  --   --   --   --  ALT 13  --   --   --   --   ALKPHOS 39  --   --   --   --   BILITOT 0.7  --   --   --   --   GFRNONAA >60   < > >60 >60 >60  ANIONGAP 6   < > 8 6 7    < > = values in this interval not displayed.    Lipids  No results for input(s): "CHOL", "TRIG", "HDL", "LABVLDL", "LDLCALC", "CHOLHDL" in the last 168 hours.   Hematology Recent Labs  Lab 10/29/23 0259 10/30/23 0234 10/31/23 0352  WBC 12.4* 14.2* 10.4  RBC 2.97* 2.94* 2.90*  HGB 8.4* 8.3* 8.0*  HCT 26.4* 26.7* 25.7*  MCV 88.9 90.8 88.6  MCH 28.3 28.2 27.6  MCHC 31.8 31.1 31.1  RDW 14.6 14.7 14.8  PLT 194 197 222   Thyroid No results for input(s): "TSH", "FREET4" in the last 168 hours.  BNPNo results for input(s): "BNP", "PROBNP" in the last 168 hours.  DDimer No results for input(s): "DDIMER" in the last 168 hours.   Radiology    VAS US DOPPLER PRE CABG  Result Date: 10/30/2023 PREOPERATIVE VASCULAR EVALUATION Patient Name:  Kyle Matthews  Date of Exam:   10/30/2023 Medical Rec #: [id number]   Accession #:    [id number] Date of Birth: 1946-02-19    Patient Gender: M  Patient Age:   77 years Exam Location:  [facility] Procedure:      VAS US DOPPLER PRE CABG Referring Phys: Evelene Croon --------------------------------------------------------------------------------  Indications:      Pre-CABG. Risk Factors:     Hypertension, hyperlipidemia, current smoker. Comparison Study: No prior exam. Performing Technologist: Fernande Bras  Examination Guidelines: A complete evaluation includes B-mode imaging, spectral Doppler, color Doppler, and power Doppler as needed of all accessible portions of each vessel. Bilateral testing is considered an integral part of a complete examination. Limited examinations for reoccurring indications may be performed as noted.  Right Carotid Findings: +----------+--------+--------+--------+-------------------------+--------+           PSV cm/sEDV cm/sStenosisDescribe                 Comments +----------+--------+--------+--------+-------------------------+--------+ CCA Prox  83      16                                                +----------+--------+--------+--------+-------------------------+--------+ CCA Distal90      13                                                +----------+--------+--------+--------+-------------------------+--------+ ICA Prox  80      13              heterogenous and calcific         +----------+--------+--------+--------+-------------------------+--------+ ICA Mid   79      23                                                +----------+--------+--------+--------+-------------------------+--------+ ICA Distal67      21                                                +----------+--------+--------+--------+-------------------------+--------+  ECA       73                                                        +----------+--------+--------+--------+-------------------------+--------+ +----------+--------+-------+----------------+------------+           PSV cm/sEDV  cmsDescribe        Arm Pressure +----------+--------+-------+----------------+------------+ Subclavian107            Multiphasic, WNL             +----------+--------+-------+----------------+------------+ +---------+--------+--+--------+--+ VertebralPSV cm/s49EDV cm/s18 +---------+--------+--+--------+--+ Left Carotid Findings: +----------+--------+--------+--------+-------------------------+--------+           PSV cm/sEDV cm/sStenosisDescribe                 Comments +----------+--------+--------+--------+-------------------------+--------+ CCA Prox  67      21                                                +----------+--------+--------+--------+-------------------------+--------+ CCA Distal73      20                                                +----------+--------+--------+--------+-------------------------+--------+ ICA Prox  107     24              calcific and heterogenous         +----------+--------+--------+--------+-------------------------+--------+ ICA Mid   102     24                                                +----------+--------+--------+--------+-------------------------+--------+ ICA Distal78      27                                                +----------+--------+--------+--------+-------------------------+--------+ ECA       70      10                                                +----------+--------+--------+--------+-------------------------+--------+  +----------+--------+--------+----------------+------------+ SubclavianPSV cm/sEDV cm/sDescribe        Arm Pressure +----------+--------+--------+----------------+------------+           117             Multiphasic, WNL             +----------+--------+--------+----------------+------------+ +---------+--------+--+--------+--+ VertebralPSV cm/s58EDV cm/s13 +---------+--------+--+--------+--+  ABI Findings: +------------------+-----+---------+ Rt Pressure  (mmHg)IndexWaveform  +------------------+-----+---------+ 105                              +------------------+-----+---------+ 139               1.32 triphasic +------------------+-----+---------+ 140  1.33 triphasic +------------------+-----+---------+ 173               1.65 Abnormal  +------------------+-----+---------+ +------------------+-----+---------+ Lt Pressure (mmHg)IndexWaveform  +------------------+-----+---------+ 105                              +------------------+-----+---------+ 135               1.29 triphasic +------------------+-----+---------+ 138               1.31 triphasic +------------------+-----+---------+ 138               1.31 Normal    +------------------+-----+---------+  Right Doppler Findings: +--------+--------+ Site    Pressure +--------+--------+ [id number]      +--------+--------+  Left Doppler Findings: +--------+--------+ Site    Pressure +--------+--------+ [id number]      +--------+--------+   Summary: Right Carotid: Velocities in the right ICA are consistent with a 1-39% stenosis.                The ECA appears <50% stenosed. Left Carotid: Velocities in the left ICA are consistent with a 1-39% stenosis.               The ECA appears <50% stenosed. Vertebrals:  Bilateral vertebral arteries demonstrate antegrade flow. Subclavians: Normal flow hemodynamics were seen in bilateral subclavian              arteries. Right ABI: Resting right ankle-brachial index is within normal range. The right toe-brachial index is abnormal. Left ABI: Resting left ankle-brachial index is within normal range. The left toe-brachial index is normal.  Electronically signed by Gerarda Fraction on 10/30/2023 at 6:18:35 PM.    Final     Cardiac Studies   TTE 10/03/2023 1. Left ventricular ejection fraction, by estimation, is 55 to 60%. The  left ventricle has normal function. The left ventricle has no regional  wall motion  abnormalities. There is mild left ventricular hypertrophy.  Left ventricular diastolic parameters  are indeterminate. Elevated left atrial pressure.   2. Right ventricular systolic function is normal. The right ventricular size is normal. There is mildly elevated pulmonary artery systolic pressure.   3. Left atrial size was moderately dilated.   4. Right atrial size was moderately dilated.   5. The mitral valve is abnormal. Mild to moderate mitral valve regurgitation. No evidence of mitral stenosis.   6. The tricuspid valve is abnormal.   7. The aortic valve was not well visualized. There is severe calcifcation of the aortic valve. There is severe thickening of the aortic valve.  Aortic valve regurgitation is moderate to severe. Severe aortic valve stenosis. Severe aortic stenosis is  present. Aortic valve mean gradient measures 42.0 mmHg. Aortic valve peak gradient measures 69.2 mmHg. Aortic valve area, by VTI measures 0.89 cm.   8. The inferior vena cava is dilated in size with >50% respiratory variability, suggesting right atrial pressure of 8 mmHg.    LHC 10/09/2023   Ost LM lesion is 50% stenosed.   1.  Moderate left main stenosis with an RFR of 0.83. 2.  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 3.  Fick cardiac output of 6.5 L/min and Fick cardiac index of 3.8 L/min/m with the following hemodynamics:             Right atrial pressure mean of 7             Right ventricular pressure 42/-2  with an end-diastolic pressure of 8 mmHg             Wedge pressure mean of 14 mmHg with V waves to 23 mmHg             PA pressure of 38/16 with a mean of 24 mmHg             PVR of 1.5 Woods units             PA pulsatility index of 3.1   Recommendation: Continue evaluation for aortic valve intervention.  Will obtain TAVR protocol CTA tomorrow.  Patient Profile     77 y.o. 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   Assessment & Plan    CAD- 50% LM CP- demand ischemia No anginal pain now.  But when he presented to ED w/ a hemoglobin of 5.4 g/dL & gross hematuria he had CP and EKG concerning for ST depressions with elevation in aVR in the setting of known left main disease.  Patient was given PRBCs and underwent prostate artery embolization - since than Hb has now remained stable and he has no rest pain.  CT surgery will see him later today for CABG/aortic valve replacement that was original planned.  Antiplatelets and anticoagulation has been held secondary to hematuria  Continue Crestor 40 mg daily Monitor for now.  HFpEF Clinically euvolemic. Blood pressures have now normalized.   Will focus on volume management. Currently IV Lasix.  Will hold off ACE and ARB inhibitors secondary to upcoming bypass surgery. Will hold SGLT2 inhibitors for the same reasons. Beta-blockers were held secondary to soft blood pressures initially in the setting of acute blood loss anemia. Medical therapy will need to be reconsidered if patient is going to discharged CABG surgery.  Severe AS No active symptoms.  Tentatively scheduled for aortic valve replacement November 02, 2023.  Bladder outlet obstruction Hematuria Acute blood loss anemia Status post blood transfusions and left prostate artery embolization with IR on 10/27/2023 No evidence of gross hematuria during morning rounds. Hemoglobin this morning >8 g/dL Recommend hemoglobin greater than 8 Urology following.   For questions or updates, please contact  [facility] Please consult [email or web address] for contact info under    Signed, Tessa Lerner, DO, [facility]  [facility]  [address] [place], [place] [address] 11:24 PM 10/31/23

## 2023-10-31 NOTE — Progress Notes (Signed)
OT Cancellation Note  Patient Details Name: Kyle Matthews MRN: [id number] DOB: [birth year]   Cancelled Treatment:    Reason Eval/Treat Not Completed: OT screened, no needs identified, will sign off OT orders received. Pt's chart reviewed. Pt evaluated by PT yesterday and is functioning at Independent level. Walking 300 ft and requires no assist for ADL tasks at this time. Will sign off at this time as pt does not have any acute OT needs. Noted that plan is for OR on 11/14 for aortic valve replacement. Please re-order OT post-op to assess any skilled OT needs. Thank you for the referral.   Limmie Patricia, OTR/L,CBIS  Supplemental OT - [facility] and [facility] Secure Chat Preferred   10/31/2023, 7:31 AM

## 2023-10-31 NOTE — TOC Progression Note (Signed)
Transition of Care [facility]) - Progression Note    Patient Details  Name: Kyle Matthews MRN: [id number] Date of Birth: [birth year]  Transition of Care [facility] CM/SW Contact  Kermit Balo, RN Phone Number: 10/31/2023, 2:39 PM  Clinical Narrative:     Tentatively scheduled for aortic valve replacement November 02, 2023.  TOC following for d/c needs.  Expected Discharge Plan: Home w Home Health Services Barriers to Discharge: Continued Medical Work up  Expected Discharge Plan and Services In-house Referral: NA Discharge Planning Services: CM Consult Post Acute Care Choice: Home Health Living arrangements for the past 2 months: Single Family Home                 DME Arranged: N/A DME Agency: NA       HH Arranged: RN HH Agency: [facility] Date HH Agency Contacted: 10/25/23 Time HH Agency Contacted: 1545 Representative spoke with at [facility] Agency: Brandi   Social Determinants of Health (SDOH) Interventions SDOH Screenings   Food Insecurity: Food Insecurity Present (10/23/2023)  Housing: Low Risk  (10/23/2023)  Transportation Needs: Unknown (10/23/2023)  Utilities: Not At Risk (10/23/2023)  Tobacco Use: High Risk (10/23/2023)    Readmission Risk Interventions    10/25/2023    3:42 PM  Readmission Risk Prevention Plan  Transportation Screening Complete  PCP or Specialist Appt within 3-5 Days Complete  HRI or Home Care Consult Complete  Palliative Care Screening Not Applicable  Medication Review (RN Care Manager) Complete

## 2023-10-31 NOTE — Plan of Care (Signed)

## 2023-10-31 NOTE — Progress Notes (Signed)
Subjective: NAEON. Pt remains in hospital until his cardiac cath.   Objective: Vital signs in last 24 hours: Temp:  [97.4 F (36.3 C)-99 F (37.2 C)] 99 F (37.2 C) (11/12 0405) Pulse Rate:  [77-87] 77 (11/12 0808) Resp:  [18] 18 (11/12 0808) BP: (108-122)/(54-67) 115/61 (11/12 0808) SpO2:  [92 %-95 %] 93 % (11/12 0808) Weight:  [66.5 kg] 66.5 kg (11/12 0154)  Assessment/Plan:  Intake #ABLA #Hematuria    Loading dose of Firmagon provided 11/4.  Will continue ADT through to prostate surgery in an effort to reduce prostate size, vascularity, and bleeding.    Foley catheter for three-way 48f hematuria coud.  CBI off.  PAE performed on 10/27/2023.  Unfortunately only left prostatic artery could be embolized.  Thankfully, it appeared to be the source for the bleeding in question.  Remains free of hematuria   120g prostate with large median lobe.  Bladder outlet obstruction occurred back in August, prior to any instance of hematuria.  The prostatic artery embolization and Deborra Medina will eventually have an effect on the prostate tissue and may reduce it's size somewhat.  However considering the very large size of his prostate I doubt it will be enough for normal urination without surgical intervention.  Foley should stay in place through his surgery and recovery.  #Bladder Spasm Still having intermittent spasm. Upped hyoscyamine to 0.25 dose Q4.  Will see PRN                                                             11/11 0701 - 11/12 0700 In: 1640 [P.O.:120] Out: 3375 [Urine:3375]  Intake/Output this shift: No intake/output data recorded.  Physical Exam:  General: Alert and oriented CV: No cyanosis Lungs: equal chest rise Gu: Foley in place with clear irrigant.  CBI off. Clear yellow urine.   Lab Results: Recent Labs    10/29/23 0259 10/30/23 0234 10/31/23 0352  HGB 8.4* 8.3* 8.0*  HCT 26.4* 26.7* 25.7*   BMET Recent Labs    10/30/23 0234 10/31/23 0352   NA 134* 134*  K 4.4 3.4*  CL 102 102  CO2 26 25  GLUCOSE 103* 105*  BUN 14 16  CREATININE 0.80 0.74  CALCIUM 8.2* 8.1*     Studies/Results: VAS US DOPPLER PRE CABG  Result Date: 10/30/2023 PREOPERATIVE VASCULAR EVALUATION Patient Name:  Kyle Matthews  Date of Exam:   10/30/2023 Medical Rec #: [id number]   Accession #:    [id number] Date of Birth: 1946/12/11    Patient Gender: M Patient Age:   77 years Exam Location:  [facility] Procedure:      VAS US DOPPLER PRE CABG Referring Phys: Evelene Croon --------------------------------------------------------------------------------  Indications:      Pre-CABG. Risk Factors:     Hypertension, hyperlipidemia, current smoker. Comparison Study: No prior exam. Performing Technologist: Fernande Bras  Examination Guidelines: A complete evaluation includes B-mode imaging, spectral Doppler, color Doppler, and power Doppler as needed of all accessible portions of each vessel. Bilateral testing is considered an integral part of a complete examination. Limited examinations for reoccurring indications may be performed as noted.  Right Carotid Findings: +----------+--------+--------+--------+-------------------------+--------+           PSV cm/sEDV cm/sStenosisDescribe  Comments +----------+--------+--------+--------+-------------------------+--------+ CCA Prox  83      16                                                +----------+--------+--------+--------+-------------------------+--------+ CCA Distal90      13                                                +----------+--------+--------+--------+-------------------------+--------+ ICA Prox  80      13              heterogenous and calcific         +----------+--------+--------+--------+-------------------------+--------+ ICA Mid   79      23                                                 +----------+--------+--------+--------+-------------------------+--------+ ICA Distal67      21                                                +----------+--------+--------+--------+-------------------------+--------+ ECA       73                                                        +----------+--------+--------+--------+-------------------------+--------+ +----------+--------+-------+----------------+------------+           PSV cm/sEDV cmsDescribe        Arm Pressure +----------+--------+-------+----------------+------------+ Subclavian107            Multiphasic, WNL             +----------+--------+-------+----------------+------------+ +---------+--------+--+--------+--+ VertebralPSV cm/s49EDV cm/s18 +---------+--------+--+--------+--+ Left Carotid Findings: +----------+--------+--------+--------+-------------------------+--------+           PSV cm/sEDV cm/sStenosisDescribe                 Comments +----------+--------+--------+--------+-------------------------+--------+ CCA Prox  67      21                                                +----------+--------+--------+--------+-------------------------+--------+ CCA Distal73      20                                                +----------+--------+--------+--------+-------------------------+--------+ ICA Prox  107     24              calcific and heterogenous         +----------+--------+--------+--------+-------------------------+--------+ ICA Mid   102     24                                                +----------+--------+--------+--------+-------------------------+--------+  ICA Distal78      27                                                +----------+--------+--------+--------+-------------------------+--------+ ECA       70      10                                                +----------+--------+--------+--------+-------------------------+--------+   +----------+--------+--------+----------------+------------+ SubclavianPSV cm/sEDV cm/sDescribe        Arm Pressure +----------+--------+--------+----------------+------------+           117             Multiphasic, WNL             +----------+--------+--------+----------------+------------+ +---------+--------+--+--------+--+ VertebralPSV cm/s58EDV cm/s13 +---------+--------+--+--------+--+  ABI Findings: +------------------+-----+---------+ Rt Pressure (mmHg)IndexWaveform  +------------------+-----+---------+ 105                              +------------------+-----+---------+ 139               1.32 triphasic +------------------+-----+---------+ 140               1.33 triphasic +------------------+-----+---------+ 173               1.65 Abnormal  +------------------+-----+---------+ +------------------+-----+---------+ Lt Pressure (mmHg)IndexWaveform  +------------------+-----+---------+ 105                              +------------------+-----+---------+ 135               1.29 triphasic +------------------+-----+---------+ 138               1.31 triphasic +------------------+-----+---------+ 138               1.31 Normal    +------------------+-----+---------+  Right Doppler Findings: +--------+--------+ Site    Pressure +--------+--------+ [id number]      +--------+--------+  Left Doppler Findings: +--------+--------+ Site    Pressure +--------+--------+ [id number]      +--------+--------+   Summary: Right Carotid: Velocities in the right ICA are consistent with a 1-39% stenosis.                The ECA appears <50% stenosed. Left Carotid: Velocities in the left ICA are consistent with a 1-39% stenosis.               The ECA appears <50% stenosed. Vertebrals:  Bilateral vertebral arteries demonstrate antegrade flow. Subclavians: Normal flow hemodynamics were seen in bilateral subclavian              arteries. Right ABI: Resting right  ankle-brachial index is within normal range. The right toe-brachial index is abnormal. Left ABI: Resting left ankle-brachial index is within normal range. The left toe-brachial index is normal.  Electronically signed by Gerarda Fraction on 10/30/2023 at 6:18:35 PM.    Final       LOS: 8 days   Elmon Kirschner, NP [facility] Pager: [phone]  10/31/2023, 11:00 AM

## 2023-11-01 DIAGNOSIS — I214 Non-ST elevation (NSTEMI) myocardial infarction: Secondary | ICD-10-CM | POA: Diagnosis not present

## 2023-11-01 DIAGNOSIS — I5032 Chronic diastolic (congestive) heart failure: Secondary | ICD-10-CM | POA: Diagnosis not present

## 2023-11-01 DIAGNOSIS — I2510 Atherosclerotic heart disease of native coronary artery without angina pectoris: Secondary | ICD-10-CM | POA: Diagnosis not present

## 2023-11-01 DIAGNOSIS — D62 Acute posthemorrhagic anemia: Secondary | ICD-10-CM | POA: Diagnosis not present

## 2023-11-01 DIAGNOSIS — I350 Nonrheumatic aortic (valve) stenosis: Secondary | ICD-10-CM | POA: Diagnosis not present

## 2023-11-01 LAB — CBC
HCT: 26.5 % — ABNORMAL LOW (ref 39.0–52.0)
Hemoglobin: 8.2 g/dL — ABNORMAL LOW (ref 13.0–17.0)
MCH: 27.8 pg (ref 26.0–34.0)
MCHC: 30.9 g/dL (ref 30.0–36.0)
MCV: 89.8 fL (ref 80.0–100.0)
Platelets: 229 10*3/uL (ref 150–400)
RBC: 2.95 MIL/uL — ABNORMAL LOW (ref 4.22–5.81)
RDW: 14.8 % (ref 11.5–15.5)
WBC: 9.6 10*3/uL (ref 4.0–10.5)
nRBC: 0 % (ref 0.0–0.2)

## 2023-11-01 LAB — BASIC METABOLIC PANEL
Anion gap: 6 (ref 5–15)
BUN: 15 mg/dL (ref 8–23)
CO2: 26 mmol/L (ref 22–32)
Calcium: 8.3 mg/dL — ABNORMAL LOW (ref 8.9–10.3)
Chloride: 102 mmol/L (ref 98–111)
Creatinine, Ser: 0.75 mg/dL (ref 0.61–1.24)
GFR, Estimated: >60 mL/min (ref >60)
Glucose, Bld: 99 mg/dL (ref 70–99)
Potassium: 4 mmol/L (ref 3.5–5.1)
Sodium: 134 mmol/L — ABNORMAL LOW (ref 135–145)

## 2023-11-01 LAB — PREPARE RBC (CROSSMATCH)

## 2023-11-01 LAB — SARS CORONAVIRUS 2 BY RT PCR: SARS Coronavirus 2 by RT PCR: NEGATIVE

## 2023-11-01 LAB — MAGNESIUM: Magnesium: 2.1 mg/dL (ref 1.7–2.4)

## 2023-11-01 MED ORDER — CEFAZOLIN SODIUM-DEXTROSE 2-4 GM/100ML-% IV SOLN
2.0000 g | INTRAVENOUS | Status: AC
  Administered 2023-11-02 (×2): 2 g via INTRAVENOUS
  Filled 2023-11-01: qty 100

## 2023-11-01 MED ORDER — NITROGLYCERIN IN D5W 200-5 MCG/ML-% IV SOLN
2.0000 ug/min | INTRAVENOUS | Status: DC
  Filled 2023-11-01: qty 250

## 2023-11-01 MED ORDER — MANNITOL 20 % IV SOLN
INTRAVENOUS | Status: DC
  Filled 2023-11-01: qty 13

## 2023-11-01 MED ORDER — PHENYLEPHRINE HCL-NACL 20-0.9 MG/250ML-% IV SOLN
30.0000 ug/min | INTRAVENOUS | Status: AC
  Administered 2023-11-02: 20 ug/min via INTRAVENOUS
  Filled 2023-11-01: qty 250

## 2023-11-01 MED ORDER — METOPROLOL TARTRATE 12.5 MG HALF TABLET
12.5000 mg | ORAL_TABLET | Freq: Once | ORAL | Status: AC
  Administered 2023-11-02: 12.5 mg via ORAL
  Filled 2023-11-01: qty 1

## 2023-11-01 MED ORDER — CEFAZOLIN SODIUM-DEXTROSE 2-4 GM/100ML-% IV SOLN
2.0000 g | INTRAVENOUS | Status: DC
  Filled 2023-11-01: qty 100

## 2023-11-01 MED ORDER — CHLORHEXIDINE GLUCONATE CLOTH 2 % EX PADS
6.0000 | MEDICATED_PAD | Freq: Once | CUTANEOUS | Status: AC
  Administered 2023-11-01: 6 via TOPICAL

## 2023-11-01 MED ORDER — TRANEXAMIC ACID (OHS) BOLUS VIA INFUSION
15.0000 mg/kg | INTRAVENOUS | Status: AC
  Administered 2023-11-02: 973.5 mg via INTRAVENOUS
  Filled 2023-11-01: qty 974

## 2023-11-01 MED ORDER — TEMAZEPAM 7.5 MG PO CAPS
15.0000 mg | ORAL_CAPSULE | Freq: Once | ORAL | Status: AC | PRN
  Administered 2023-11-01: 15 mg via ORAL
  Filled 2023-11-01: qty 2

## 2023-11-01 MED ORDER — VANCOMYCIN HCL 1250 MG/250ML IV SOLN
1250.0000 mg | INTRAVENOUS | Status: AC
  Administered 2023-11-02: 1250 mg via INTRAVENOUS
  Filled 2023-11-01: qty 250

## 2023-11-01 MED ORDER — TRANEXAMIC ACID (OHS) PUMP PRIME SOLUTION
2.0000 mg/kg | INTRAVENOUS | Status: DC
  Filled 2023-11-01: qty 1.3

## 2023-11-01 MED ORDER — MILRINONE LACTATE IN DEXTROSE 20-5 MG/100ML-% IV SOLN
0.3000 ug/kg/min | INTRAVENOUS | Status: DC
  Filled 2023-11-01: qty 100

## 2023-11-01 MED ORDER — HEPARIN 30,000 UNITS/1000 ML (OHS) CELLSAVER SOLUTION
Status: DC
  Filled 2023-11-01: qty 1000

## 2023-11-01 MED ORDER — INSULIN REGULAR(HUMAN) IN NACL 100-0.9 UT/100ML-% IV SOLN
INTRAVENOUS | Status: AC
  Administered 2023-11-02: 1.6 [IU]/h via INTRAVENOUS
  Filled 2023-11-01: qty 100

## 2023-11-01 MED ORDER — CHLORHEXIDINE GLUCONATE 0.12 % MT SOLN
15.0000 mL | Freq: Once | OROMUCOSAL | Status: AC
  Administered 2023-11-02: 15 mL via OROMUCOSAL
  Filled 2023-11-01: qty 15

## 2023-11-01 MED ORDER — DIAZEPAM 5 MG PO TABS
5.0000 mg | ORAL_TABLET | Freq: Once | ORAL | Status: AC
  Administered 2023-11-02: 5 mg via ORAL
  Filled 2023-11-01: qty 1

## 2023-11-01 MED ORDER — NOREPINEPHRINE 4 MG/250ML-% IV SOLN
0.0000 ug/min | INTRAVENOUS | Status: DC
  Filled 2023-11-01: qty 250

## 2023-11-01 MED ORDER — PLASMA-LYTE A IV SOLN
INTRAVENOUS | Status: DC
  Filled 2023-11-01: qty 5

## 2023-11-01 MED ORDER — DEXMEDETOMIDINE HCL IN NACL 400 MCG/100ML IV SOLN
0.1000 ug/kg/h | INTRAVENOUS | Status: AC
  Administered 2023-11-02: .7 ug/kg/h via INTRAVENOUS
  Filled 2023-11-01: qty 100

## 2023-11-01 MED ORDER — EPINEPHRINE HCL 5 MG/250ML IV SOLN IN NS
0.0000 ug/min | INTRAVENOUS | Status: DC
  Filled 2023-11-01: qty 250

## 2023-11-01 MED ORDER — TRANEXAMIC ACID 1000 MG/10ML IV SOLN
1.5000 mg/kg/h | INTRAVENOUS | Status: AC
  Administered 2023-11-02: 1.5 mg/kg/h via INTRAVENOUS
  Filled 2023-11-01: qty 25

## 2023-11-01 MED ORDER — POTASSIUM CHLORIDE 2 MEQ/ML IV SOLN
80.0000 meq | INTRAVENOUS | Status: DC
  Filled 2023-11-01: qty 40

## 2023-11-01 MED ORDER — BISACODYL 5 MG PO TBEC
5.0000 mg | DELAYED_RELEASE_TABLET | Freq: Once | ORAL | Status: AC
  Administered 2023-11-01: 5 mg via ORAL
  Filled 2023-11-01: qty 1

## 2023-11-01 NOTE — Plan of Care (Signed)

## 2023-11-01 NOTE — Progress Notes (Signed)
CARDIAC REHAB PHASE I    Pt resting in bed, feeling well today. Ambulating well, no CP,SOB or dizziness. Pre-op [facility] education completed no questions or concerns.    Woodroe Chen, RN BSN 11/01/2023 2:40 PM

## 2023-11-01 NOTE — Progress Notes (Signed)
PROGRESS NOTE Kyle Matthews  AOZ:[id number] DOB: 05-29-1946 DOA: 10/23/2023 PCP: Mila Palmer, MD  Brief Narrative/Hospital Course: 77 y.o. male with medical history significant for severe aortic stenosis, BPH with recurrent gross hematuria, chronic blood loss anemia associated baseline hemoglobin 7.5-10, who is admitted to [facility] on 10/23/2023 with NSTEMI after presenting from home to [facility] ED complaining of chest pain.  He was admitted to the hospital about 2 weeks ago for chest pain and hematuria.  Eventual plan was to challenge him with DAPT for couple of weeks and if tolerates, proceed with TAVR/PCI.Seen by urology but now developed hematuria again-status post multiple PRBC transfusion. S/p  IR  prostate artery embolization 11/8.  Cardiology following Cont PPI     Subjective: Seen and examined, Overnight vitals/labs/events reviewed  Overnight afebrile BP 115-120 systolic, on room air  Labs shows hypokalemia resolved hemoglobin with chronic anemia 8.2 g   Assessment and Plan: Principal Problem:   NSTEMI (non-ST elevated myocardial infarction) (HCC) Active Problems:   Symptomatic anemia   Severe aortic stenosis   BPH with urinary obstruction   Hyperlipidemia   Chest pain   History of essential hypertension   GERD (gastroesophageal reflux disease)   Acute heart failure with preserved ejection fraction (HFpEF) (HCC)   Atherosclerosis of native coronary artery of native heart without angina pectoris   Pressure injury of skin   Acute blood loss anemia   Benign hypertension  ABLA Hematuria sp PAE 11/8-1 with left prostate artery could be embolized. Bladder outlet obstruction/BPH W/ chronic indwelling Foley catheter: Seen by urology, s/p multiple PRBC transfusions.S/p  IR  prostate artery embolization 11/8-1 left artery could be embolized but thankfully appears to be the source of bleeding. Urine appears clear , CBI off 11/11 Recent Labs  Lab 10/28/23 0242 10/29/23 0259  10/30/23 0234 10/31/23 0352 11/01/23 0334  HGB 8.6* 8.4* 8.3* 8.0* 8.2*  HCT 26.9* 26.4* 26.7* 25.7* 26.5*     Acute on chronic congestive heart failure with preserved EF: Overall patient has been euvolemic, BP stable monitor volume status,, cardiology following, continue oral Lasix 20 mg daily.  CAD-50% LM lesion Chest pain with demand ischemia Severe aortic stenosis: Tentatively for  CABG/AVR 11/14-cardiothoracic surgery and cardiology following closely appreciate input.  Completing preop imaging Recently patient was placed on DAPT challenge but did not tolerate therefore discontinued due to hematuria.Antiplatelet on hold .  Continue Crestor  Acute hypoxic respiratory failure and shortness of breath: Resolved,    AKI: Resolved.    Hypokalemia: Replaced.  HTN: BP stable.  Continue on oral Lasix.  GERD: Cont ppi  DVT prophylaxis: SCDs Start: 10/23/23 0358 Code Status:   Code Status: Full Code Family Communication: plan of care discussed with patient/wife at bedside. Patient status is: Inpatient because of hematuria and pending further cardiac intervention. Level of care: Progressive   Dispo: The patient is from: home with wife-            Anticipated disposition: Awaiting for CABG.AVR 11/14> transfer to CT surgery service 11/14  Objective: Vitals last 24 hrs: Vitals:   10/31/23 0405 10/31/23 0808 10/31/23 1938 11/01/23 0425  BP: (!) 122/55 115/61 (!) 116/57 (!) 128/59  Pulse: 84 77 85 74  Resp: 18 18 18 20   Temp: 99 F (37.2 C)  98.6 F (37 C) 98.4 F (36.9 C)  TempSrc: Oral  Oral Oral  SpO2: 94% 93% 95% 96%  Weight:    64.9 kg  Height:       Weight change: -  1.59 kg  Physical Examination: General exam:alert awake, oriented x 3. HEENT:Oral mucosa moist, Ear/Nose WNL grossly. Respiratory system: Bilaterally clear BS,no use of accessory muscle Cardiovascular system: S1 & S2 +, No JVD. Gastrointestinal system: Abdomen soft,NT,ND, BS+ Nervous System: Alert,  awake, moving all extremities,and following commands. Extremities: LE edema neg,distal peripheral pulses palpable and warm.  Skin: No rashes,no icterus. MSK: Normal muscle bulk,tone, power  Foley catheter in place   Medications reviewed:  Scheduled Meds:  Chlorhexidine Gluconate Cloth  6 each Topical Daily   finasteride  5 mg Oral QPM   furosemide  20 mg Intravenous Daily   pantoprazole  40 mg Oral Daily   rosuvastatin  40 mg Oral Daily   Continuous Infusions:  sodium chloride irrigation     Diet Order             Diet regular Room service appropriate? Yes; Fluid consistency: Thin  Diet effective now                  Intake/Output Summary (Last 24 hours) at 11/01/2023 0851 Last data filed at 11/01/2023 0530 Gross per 24 hour  Intake 60 ml  Output 2490 ml  Net -2430 ml   Net IO Since Admission: -46,962.16 mL [11/01/23 0851]  Wt Readings from Last 3 Encounters:  11/01/23 64.9 kg  10/18/23 67.7 kg  10/15/23 66.7 kg     Unresulted Labs (From admission, onward)    None     Data Reviewed: I have personally reviewed following labs and imaging studies CBC: Recent Labs  Lab 10/28/23 0242 10/29/23 0259 10/30/23 0234 10/31/23 0352 11/01/23 0334  WBC 7.0 12.4* 14.2* 10.4 9.6  HGB 8.6* 8.4* 8.3* 8.0* 8.2*  HCT 26.9* 26.4* 26.7* 25.7* 26.5*  MCV 90.0 88.9 90.8 88.6 89.8  PLT 193 194 197 222 229   Basic Metabolic Panel: Recent Labs  Lab 10/28/23 0242 10/29/23 0259 10/30/23 0234 10/31/23 0352 11/01/23 0334  NA 136 134* 134* 134* 134*  K 3.9 3.6 4.4 3.4* 4.0  CL 102 102 102 102 102  CO2 27 24 26 25 26   GLUCOSE 111* 111* 103* 105* 99  BUN 18 12 14 16 15   CREATININE 0.85 0.73 0.80 0.74 0.75  CALCIUM 8.3* 8.1* 8.2* 8.1* 8.3*  MG 2.0 2.0 2.2 2.2 2.1   GFR: Estimated Creatinine Clearance: 64.2 mL/min (by C-G formula based on SCr of 0.75 mg/dL). Liver Function Tests: No results for input(s): "AST", "ALT", "ALKPHOS", "BILITOT", "PROT", "ALBUMIN" in the last  168 hours. No results found for this or any previous visit (from the past 240 hour(s)).  Antimicrobials: Anti-infectives (From admission, onward)    Start     Dose/Rate Route Frequency Ordered Stop   10/27/23 0000  cefTRIAXone (ROCEPHIN) 2 g in sodium chloride 0.9 % 100 mL IVPB        2 g 200 mL/hr over 30 Minutes Intravenous To Radiology 10/26/23 1250 10/27/23 1033      Culture/Microbiology    Component Value Date/Time   SDES URINE, CATHETERIZED 10/15/2023 0518   SPECREQUEST  10/15/2023 0518    NONE Performed at [facility], [address], [place], [place] [address]    CULT 10,000 COLONIES/mL ENTEROBACTER CLOACAE (A) 10/15/2023 0518   REPTSTATUS 10/21/2023 FINAL 10/15/2023 0518  Radiology Studies: VAS US DOPPLER PRE CABG  Result Date: 10/30/2023 PREOPERATIVE VASCULAR EVALUATION Patient Name:  Kyle Matthews  Date of Exam:   10/30/2023 Medical Rec #: [id number]   Accession #:  [id number] Date of Birth: 04/18/1946    Patient Gender: M Patient Age:   46 years Exam Location:  [facility] Procedure:      VAS US DOPPLER PRE CABG Referring Phys: Evelene Croon --------------------------------------------------------------------------------  Indications:      Pre-CABG. Risk Factors:     Hypertension, hyperlipidemia, current smoker. Comparison Study: No prior exam. Performing Technologist: Fernande Bras  Examination Guidelines: A complete evaluation includes B-mode imaging, spectral Doppler, color Doppler, and power Doppler as needed of all accessible portions of each vessel. Bilateral testing is considered an integral part of a complete examination. Limited examinations for reoccurring indications may be performed as noted.  Right Carotid Findings: +----------+--------+--------+--------+-------------------------+--------+           PSV cm/sEDV cm/sStenosisDescribe                 Comments +----------+--------+--------+--------+-------------------------+--------+ CCA Prox   83      16                                                +----------+--------+--------+--------+-------------------------+--------+ CCA Distal90      13                                                +----------+--------+--------+--------+-------------------------+--------+ ICA Prox  80      13              heterogenous and calcific         +----------+--------+--------+--------+-------------------------+--------+ ICA Mid   79      23                                                +----------+--------+--------+--------+-------------------------+--------+ ICA Distal67      21                                                +----------+--------+--------+--------+-------------------------+--------+ ECA       73                                                        +----------+--------+--------+--------+-------------------------+--------+ +----------+--------+-------+----------------+------------+           PSV cm/sEDV cmsDescribe        Arm Pressure +----------+--------+-------+----------------+------------+ Subclavian107            Multiphasic, WNL             +----------+--------+-------+----------------+------------+ +---------+--------+--+--------+--+ VertebralPSV cm/s49EDV cm/s18 +---------+--------+--+--------+--+ Left Carotid Findings: +----------+--------+--------+--------+-------------------------+--------+           PSV cm/sEDV cm/sStenosisDescribe                 Comments +----------+--------+--------+--------+-------------------------+--------+ CCA Prox  67      21                                                +----------+--------+--------+--------+-------------------------+--------+  CCA Distal73      20                                                +----------+--------+--------+--------+-------------------------+--------+ ICA Prox  107     24              calcific and heterogenous          +----------+--------+--------+--------+-------------------------+--------+ ICA Mid   102     24                                                +----------+--------+--------+--------+-------------------------+--------+ ICA Distal78      27                                                +----------+--------+--------+--------+-------------------------+--------+ ECA       70      10                                                +----------+--------+--------+--------+-------------------------+--------+  +----------+--------+--------+----------------+------------+ SubclavianPSV cm/sEDV cm/sDescribe        Arm Pressure +----------+--------+--------+----------------+------------+           117             Multiphasic, WNL             +----------+--------+--------+----------------+------------+ +---------+--------+--+--------+--+ VertebralPSV cm/s58EDV cm/s13 +---------+--------+--+--------+--+  ABI Findings: +------------------+-----+---------+ Rt Pressure (mmHg)IndexWaveform  +------------------+-----+---------+ 105                              +------------------+-----+---------+ 139               1.32 triphasic +------------------+-----+---------+ 140               1.33 triphasic +------------------+-----+---------+ 173               1.65 Abnormal  +------------------+-----+---------+ +------------------+-----+---------+ Lt Pressure (mmHg)IndexWaveform  +------------------+-----+---------+ 105                              +------------------+-----+---------+ 135               1.29 triphasic +------------------+-----+---------+ 138               1.31 triphasic +------------------+-----+---------+ 138               1.31 Normal    +------------------+-----+---------+  Right Doppler Findings: +--------+--------+ Site    Pressure +--------+--------+ [id number]      +--------+--------+  Left Doppler Findings: +--------+--------+ Site     Pressure +--------+--------+ [id number]      +--------+--------+   Summary: Right Carotid: Velocities in the right ICA are consistent with a 1-39% stenosis.                The ECA appears <50% stenosed. Left Carotid: Velocities in the left ICA are consistent with a 1-39% stenosis.  The ECA appears <50% stenosed. Vertebrals:  Bilateral vertebral arteries demonstrate antegrade flow. Subclavians: Normal flow hemodynamics were seen in bilateral subclavian              arteries. Right ABI: Resting right ankle-brachial index is within normal range. The right toe-brachial index is abnormal. Left ABI: Resting left ankle-brachial index is within normal range. The left toe-brachial index is normal.  Electronically signed by Gerarda Fraction on 10/30/2023 at 6:18:35 PM.    Final      LOS: 9 days   Lanae Boast, MD [facility]  11/01/2023, 8:51 AM

## 2023-11-01 NOTE — Consult Note (Addendum)
WOC Nurse Consult Note: Reason for Consult: Consult requested for left ear Stage 1 pressure injury.  Performed remotely after review of the progress notes and the bedside nurses wound flow sheet and was not noted as present on admission. This can be treated by the bedside nurses using the skin care order set in Epic by applying a foam dressing to protect from further injury Please re-consult if further assistance is needed.  Thank-you,  Cammie Mcgee MSN, RN, CWOCN, [place], CNS [phone]

## 2023-11-01 NOTE — Plan of Care (Signed)

## 2023-11-01 NOTE — Progress Notes (Signed)
Mobility Specialist Progress Note:    11/01/23 1147  Mobility  Activity Ambulated with assistance in hallway  Level of Assistance Standby assist, set-up cues, supervision of patient - no hands on  Assistive Device None  Distance Ambulated (ft) 300 ft  Activity Response Tolerated well  Mobility Referral Yes  $Mobility charge 1 Mobility  Mobility Specialist Start Time (ACUTE ONLY) 0935  Mobility Specialist Stop Time (ACUTE ONLY) 0948  Mobility Specialist Time Calculation (min) (ACUTE ONLY) 13 min   Received pt supine in bed having no complaints and agreeable to mobility. Pt was asymptomatic throughout ambulation and returned to room w/o fault. Left sitting EOB w/ call bell in reach and all needs met.   Thompson Grayer Mobility Specialist  Please contact vis Secure Chat or  Rehab Office [phone]

## 2023-11-01 NOTE — Progress Notes (Signed)
Rounding Note    Patient Name: Kyle Matthews Date of Encounter: 11/01/2023  [facility] Cardiologist: Maisie Fus, MD   Subjective  Interval history: No resting in bed comfortably. No events overnight. Denies anginal chest pain or heart failure symptoms.   No gross hematuria in Foley.  Inpatient Medications    Scheduled Meds:  Chlorhexidine Gluconate Cloth  6 each Topical Daily   finasteride  5 mg Oral QPM   furosemide  20 mg Intravenous Daily   pantoprazole  40 mg Oral Daily   rosuvastatin  40 mg Oral Daily   Continuous Infusions:  sodium chloride irrigation      PRN Meds: acetaminophen **OR** acetaminophen, hydrALAZINE, hyoscyamine, ipratropium-albuterol, melatonin, metoprolol tartrate, morphine injection, ondansetron (ZOFRAN) IV, mouth rinse, oxyCODONE, senna-docusate   Vital Signs    Vitals:   10/31/23 0405 10/31/23 0808 10/31/23 1938 11/01/23 0425  BP: (!) 122/55 115/61 (!) 116/57 (!) 128/59  Pulse: 84 77 85 74  Resp: 18 18 18 20   Temp: 99 F (37.2 C)  98.6 F (37 C) 98.4 F (36.9 C)  TempSrc: Oral  Oral Oral  SpO2: 94% 93% 95% 96%  Weight:    64.9 kg  Height:        Intake/Output Summary (Last 24 hours) at 11/01/2023 1022 Last data filed at 11/01/2023 0530 Gross per 24 hour  Intake 60 ml  Output 2490 ml  Net -2430 ml   Net IO Since Admission: -16,109.16 mL [11/01/23 1022]     11/01/2023    4:25 AM 10/31/2023    1:54 AM 10/30/2023    4:00 AM  Last 3 Weights  Weight (lbs) 143 lb 1.6 oz 146 lb 9.7 oz 145 lb 1.6 oz  Weight (kg) 64.91 kg 66.5 kg 65.817 kg      Telemetry    Sinus rhythm with no ectopy.- Personally Reviewed  ECG  No new  Initial Sinus rhythm with diffuse ST depression and mild aVR elevation- Personally Reviewed  Physical Exam   Vitals:   10/31/23 1938 11/01/23 0425  BP: (!) 116/57 (!) 128/59  Pulse: 85 74  Resp: 18 20  Temp: 98.6 F (37 C) 98.4 F (36.9 C)  SpO2: 95% 96%    GEN:  Hemodynamically  stable, no acute distress Neck: No JVP. Cardiac: RRR, SEM RUSB that radiates to the apex Respiratory: nl wob, no wheezes rales or rhonchi's.   GI: Soft, nontender, nondistended. GU: Continuous bladder irrigation MS: No significant lower extremity, warm to touch Neuro:  Nonfocal  Psych: Normal affect   Labs    High Sensitivity Troponin:   Recent Labs  Lab 10/03/23 1521 10/04/23 0505 10/23/23 0129 10/23/23 0740 10/23/23 1700  TROPONINIHS 4,814* 3,357* 369* 482* 1,234*     Chemistry Recent Labs  Lab 10/30/23 0234 10/31/23 0352 11/01/23 0334  NA 134* 134* 134*  K 4.4 3.4* 4.0  CL 102 102 102  CO2 26 25 26   GLUCOSE 103* 105* 99  BUN 14 16 15   CREATININE 0.80 0.74 0.75  CALCIUM 8.2* 8.1* 8.3*  MG 2.2 2.2 2.1  GFRNONAA >60 >60 >60  ANIONGAP 6 7 6     Lipids  No results for input(s): "CHOL", "TRIG", "HDL", "LABVLDL", "LDLCALC", "CHOLHDL" in the last 168 hours.   Hematology Recent Labs  Lab 10/30/23 0234 10/31/23 0352 11/01/23 0334  WBC 14.2* 10.4 9.6  RBC 2.94* 2.90* 2.95*  HGB 8.3* 8.0* 8.2*  HCT 26.7* 25.7* 26.5*  MCV 90.8 88.6 89.8  MCH 28.2  27.6 27.8  MCHC 31.1 31.1 30.9  RDW 14.7 14.8 14.8  PLT 197 222 229   Thyroid No results for input(s): "TSH", "FREET4" in the last 168 hours.  BNPNo results for input(s): "BNP", "PROBNP" in the last 168 hours.  DDimer No results for input(s): "DDIMER" in the last 168 hours.   Radiology    VAS US DOPPLER PRE CABG  Result Date: 10/30/2023 PREOPERATIVE VASCULAR EVALUATION Patient Name:  Kyle Matthews  Date of Exam:   10/30/2023 Medical Rec #: [id number]   Accession #:    [id number] Date of Birth: Feb 17, 1946    Patient Gender: M Patient Age:   77 years Exam Location:  [facility] Procedure:      VAS US DOPPLER PRE CABG Referring Phys: Evelene Croon --------------------------------------------------------------------------------  Indications:      Pre-CABG. Risk Factors:     Hypertension, hyperlipidemia, current smoker.  Comparison Study: No prior exam. Performing Technologist: Fernande Bras  Examination Guidelines: A complete evaluation includes B-mode imaging, spectral Doppler, color Doppler, and power Doppler as needed of all accessible portions of each vessel. Bilateral testing is considered an integral part of a complete examination. Limited examinations for reoccurring indications may be performed as noted.  Right Carotid Findings: +----------+--------+--------+--------+-------------------------+--------+           PSV cm/sEDV cm/sStenosisDescribe                 Comments +----------+--------+--------+--------+-------------------------+--------+ CCA Prox  83      16                                                +----------+--------+--------+--------+-------------------------+--------+ CCA Distal90      13                                                +----------+--------+--------+--------+-------------------------+--------+ ICA Prox  80      13              heterogenous and calcific         +----------+--------+--------+--------+-------------------------+--------+ ICA Mid   79      23                                                +----------+--------+--------+--------+-------------------------+--------+ ICA Distal67      21                                                +----------+--------+--------+--------+-------------------------+--------+ ECA       73                                                        +----------+--------+--------+--------+-------------------------+--------+ +----------+--------+-------+----------------+------------+           PSV cm/sEDV cmsDescribe        Arm Pressure +----------+--------+-------+----------------+------------+ [id number]  Multiphasic, WNL             +----------+--------+-------+----------------+------------+ +---------+--------+--+--------+--+ VertebralPSV cm/s49EDV cm/s18  +---------+--------+--+--------+--+ Left Carotid Findings: +----------+--------+--------+--------+-------------------------+--------+           PSV cm/sEDV cm/sStenosisDescribe                 Comments +----------+--------+--------+--------+-------------------------+--------+ CCA Prox  67      21                                                +----------+--------+--------+--------+-------------------------+--------+ CCA Distal73      20                                                +----------+--------+--------+--------+-------------------------+--------+ ICA Prox  107     24              calcific and heterogenous         +----------+--------+--------+--------+-------------------------+--------+ ICA Mid   102     24                                                +----------+--------+--------+--------+-------------------------+--------+ ICA Distal78      27                                                +----------+--------+--------+--------+-------------------------+--------+ ECA       70      10                                                +----------+--------+--------+--------+-------------------------+--------+  +----------+--------+--------+----------------+------------+ SubclavianPSV cm/sEDV cm/sDescribe        Arm Pressure +----------+--------+--------+----------------+------------+           117             Multiphasic, WNL             +----------+--------+--------+----------------+------------+ +---------+--------+--+--------+--+ VertebralPSV cm/s58EDV cm/s13 +---------+--------+--+--------+--+  ABI Findings: +------------------+-----+---------+ Rt Pressure (mmHg)IndexWaveform  +------------------+-----+---------+ 105                              +------------------+-----+---------+ 139               1.32 triphasic +------------------+-----+---------+ 140               1.33 triphasic +------------------+-----+---------+  173               1.65 Abnormal  +------------------+-----+---------+ +------------------+-----+---------+ Lt Pressure (mmHg)IndexWaveform  +------------------+-----+---------+ 105                              +------------------+-----+---------+ 135               1.29 triphasic +------------------+-----+---------+ 138  1.31 triphasic +------------------+-----+---------+ 138               1.31 Normal    +------------------+-----+---------+  Right Doppler Findings: +--------+--------+ Site    Pressure +--------+--------+ [id number]      +--------+--------+  Left Doppler Findings: +--------+--------+ Site    Pressure +--------+--------+ [id number]      +--------+--------+   Summary: Right Carotid: Velocities in the right ICA are consistent with a 1-39% stenosis.                The ECA appears <50% stenosed. Left Carotid: Velocities in the left ICA are consistent with a 1-39% stenosis.               The ECA appears <50% stenosed. Vertebrals:  Bilateral vertebral arteries demonstrate antegrade flow. Subclavians: Normal flow hemodynamics were seen in bilateral subclavian              arteries. Right ABI: Resting right ankle-brachial index is within normal range. The right toe-brachial index is abnormal. Left ABI: Resting left ankle-brachial index is within normal range. The left toe-brachial index is normal.  Electronically signed by Gerarda Fraction on 10/30/2023 at 6:18:35 PM.    Final     Cardiac Studies   TTE 10/03/2023 1. Left ventricular ejection fraction, by estimation, is 55 to 60%. The  left ventricle has normal function. The left ventricle has no regional  wall motion abnormalities. There is mild left ventricular hypertrophy.  Left ventricular diastolic parameters  are indeterminate. Elevated left atrial pressure.   2. Right ventricular systolic function is normal. The right ventricular size is normal. There is mildly elevated pulmonary artery systolic  pressure.   3. Left atrial size was moderately dilated.   4. Right atrial size was moderately dilated.   5. The mitral valve is abnormal. Mild to moderate mitral valve regurgitation. No evidence of mitral stenosis.   6. The tricuspid valve is abnormal.   7. The aortic valve was not well visualized. There is severe calcifcation of the aortic valve. There is severe thickening of the aortic valve.  Aortic valve regurgitation is moderate to severe. Severe aortic valve stenosis. Severe aortic stenosis is  present. Aortic valve mean gradient measures 42.0 mmHg. Aortic valve peak gradient measures 69.2 mmHg. Aortic valve area, by VTI measures 0.89 cm.   8. The inferior vena cava is dilated in size with >50% respiratory variability, suggesting right atrial pressure of 8 mmHg.    LHC 10/09/2023   Ost LM lesion is 50% stenosed.   1.  Moderate left main stenosis with an RFR of 0.83. 2.  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 3.  Fick cardiac output of 6.5 L/min and Fick cardiac index of 3.8 L/min/m with the following hemodynamics:             Right atrial pressure mean of 7             Right ventricular pressure 42/-2 with an end-diastolic pressure of 8 mmHg             Wedge pressure mean of 14 mmHg with V waves to 23 mmHg             PA pressure of 38/16 with a mean of 24 mmHg             PVR of 1.5 Woods units             PA pulsatility index of 3.1   Recommendation: Continue evaluation  for aortic valve intervention.  Will obtain TAVR protocol CTA tomorrow.  Patient Profile     77 y.o. 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   Assessment & Plan    CAD- 50% LM CP- demand ischemia No anginal pain overnight.  But when he presented to ED w/ a hemoglobin of 5.4 g/dL & gross hematuria he had CP and EKG  concerning for ST depressions with elevation in aVR in the setting of known left main disease.  Patient was given PRBCs and underwent prostate artery embolization - since than Hb has now remained stable and he has no rest pain.  Scheduled for bypass surgery/aortic valve replacement with Dr. Lavinia Sharps tomorrow 11/02/2023 Antiplatelets and anticoagulation has been held secondary to hematuria  Continue Crestor 40 mg daily Monitor for now.  HFpEF Clinically euvolemic. Currently IV Lasix 20 mg daily.  Will hold off ACE and ARB inhibitors secondary to upcoming bypass surgery. Will hold SGLT2 inhibitors for the same reasons. Beta-blockers were held secondary to soft blood pressures initially in the setting of acute blood loss anemia. Medical therapy will need to be reconsidered if patient is going to discharged CABG surgery.  Severe AS No active symptoms.  Tentatively scheduled for aortic valve replacement November 02, 2023.  Bladder outlet obstruction Hematuria Acute blood loss anemia Status post blood transfusions and left prostate artery embolization with IR on 10/27/2023 No evidence of gross hematuria during morning rounds. Hemoglobin this morning >8 g/dL Recommend hemoglobin greater than 8 Urology following.  [facility] will sign off as of 11/02/2023 - re consult cardiology if needed / questions arise.  Medication Recommendations:  NA for now as he is undergoing CABG/AVR Follow up as an outpatient: to be determined closer to d/c - reach out to have this arranged.    For questions or updates, please contact [facility] Please consult [email or web address] for contact info under    Signed, Tessa Lerner, DO, [facility]  [facility]  [address] [place], [place] [address] 10:22 AM 11/01/23

## 2023-11-02 ENCOUNTER — Inpatient Hospital Stay (HOSPITAL_COMMUNITY): Payer: Medicare Other

## 2023-11-02 ENCOUNTER — Inpatient Hospital Stay (HOSPITAL_COMMUNITY): Admission: RE | Admit: 2023-11-02 | Payer: Medicare Other | Source: Home / Self Care | Admitting: Surgery

## 2023-11-02 ENCOUNTER — Encounter (HOSPITAL_COMMUNITY)
Admission: EM | Disposition: A | Discharge status: Home Health | Payer: Self-pay | Source: Home / Self Care | Attending: Surgery

## 2023-11-02 ENCOUNTER — Ambulatory Visit: Payer: Medicare Other | Admitting: Internal Medicine

## 2023-11-02 ENCOUNTER — Inpatient Hospital Stay (HOSPITAL_COMMUNITY): Payer: Medicare Other | Admitting: Anesthesiology

## 2023-11-02 ENCOUNTER — Encounter (HOSPITAL_COMMUNITY): Payer: Self-pay | Admitting: Internal Medicine

## 2023-11-02 DIAGNOSIS — Z952 Presence of prosthetic heart valve: Secondary | ICD-10-CM

## 2023-11-02 DIAGNOSIS — I351 Nonrheumatic aortic (valve) insufficiency: Secondary | ICD-10-CM | POA: Diagnosis not present

## 2023-11-02 DIAGNOSIS — I2510 Atherosclerotic heart disease of native coronary artery without angina pectoris: Secondary | ICD-10-CM | POA: Diagnosis not present

## 2023-11-02 DIAGNOSIS — Z951 Presence of aortocoronary bypass graft: Secondary | ICD-10-CM

## 2023-11-02 DIAGNOSIS — I350 Nonrheumatic aortic (valve) stenosis: Secondary | ICD-10-CM | POA: Diagnosis not present

## 2023-11-02 HISTORY — PX: AORTIC VALVE REPLACEMENT: SHX41

## 2023-11-02 HISTORY — PX: CORONARY ARTERY BYPASS GRAFT: SHX141

## 2023-11-02 HISTORY — PX: TEE WITHOUT CARDIOVERSION: SHX5443

## 2023-11-02 LAB — POCT I-STAT, CHEM 8
BUN: 13 mg/dL (ref 8–23)
BUN: 12 mg/dL (ref 8–23)
BUN: 12 mg/dL (ref 8–23)
BUN: 13 mg/dL (ref 8–23)
BUN: 14 mg/dL (ref 8–23)
BUN: 15 mg/dL (ref 8–23)
Calcium, Ion: 1.21 mmol/L (ref 1.15–1.40)
Calcium, Ion: 1.22 mmol/L (ref 1.15–1.40)
Calcium, Ion: 1.06 mmol/L — ABNORMAL LOW (ref 1.15–1.40)
Calcium, Ion: 1.11 mmol/L — ABNORMAL LOW (ref 1.15–1.40)
Calcium, Ion: 1.1 mmol/L — ABNORMAL LOW (ref 1.15–1.40)
Calcium, Ion: 1.06 mmol/L — ABNORMAL LOW (ref 1.15–1.40)
Chloride: 102 mmol/L (ref 98–111)
Chloride: 101 mmol/L (ref 98–111)
Chloride: 102 mmol/L (ref 98–111)
Chloride: 103 mmol/L (ref 98–111)
Chloride: 103 mmol/L (ref 98–111)
Chloride: 103 mmol/L (ref 98–111)
Creatinine, Ser: 0.6 mg/dL — ABNORMAL LOW (ref 0.61–1.24)
Creatinine, Ser: 0.6 mg/dL — ABNORMAL LOW (ref 0.61–1.24)
Creatinine, Ser: 0.5 mg/dL — ABNORMAL LOW (ref 0.61–1.24)
Creatinine, Ser: 0.5 mg/dL — ABNORMAL LOW (ref 0.61–1.24)
Creatinine, Ser: 0.6 mg/dL — ABNORMAL LOW (ref 0.61–1.24)
Creatinine, Ser: 0.6 mg/dL — ABNORMAL LOW (ref 0.61–1.24)
Glucose, Bld: 106 mg/dL — ABNORMAL HIGH (ref 70–99)
Glucose, Bld: 104 mg/dL — ABNORMAL HIGH (ref 70–99)
Glucose, Bld: 152 mg/dL — ABNORMAL HIGH (ref 70–99)
Glucose, Bld: 164 mg/dL — ABNORMAL HIGH (ref 70–99)
Glucose, Bld: 165 mg/dL — ABNORMAL HIGH (ref 70–99)
Glucose, Bld: 144 mg/dL — ABNORMAL HIGH (ref 70–99)
HCT: 23 % — ABNORMAL LOW (ref 39.0–52.0)
HCT: 22 % — ABNORMAL LOW (ref 39.0–52.0)
HCT: 23 % — ABNORMAL LOW (ref 39.0–52.0)
HCT: 24 % — ABNORMAL LOW (ref 39.0–52.0)
HCT: 22 % — ABNORMAL LOW (ref 39.0–52.0)
HCT: 26 % — ABNORMAL LOW (ref 39.0–52.0)
Hemoglobin: 7.8 g/dL — ABNORMAL LOW (ref 13.0–17.0)
Hemoglobin: 7.5 g/dL — ABNORMAL LOW (ref 13.0–17.0)
Hemoglobin: 7.8 g/dL — ABNORMAL LOW (ref 13.0–17.0)
Hemoglobin: 8.2 g/dL — ABNORMAL LOW (ref 13.0–17.0)
Hemoglobin: 7.5 g/dL — ABNORMAL LOW (ref 13.0–17.0)
Hemoglobin: 8.8 g/dL — ABNORMAL LOW (ref 13.0–17.0)
Potassium: 3.6 mmol/L (ref 3.5–5.1)
Potassium: 3.8 mmol/L (ref 3.5–5.1)
Potassium: 4.6 mmol/L (ref 3.5–5.1)
Potassium: 4.2 mmol/L (ref 3.5–5.1)
Potassium: 4.4 mmol/L (ref 3.5–5.1)
Potassium: 3.9 mmol/L (ref 3.5–5.1)
Sodium: 138 mmol/L (ref 135–145)
Sodium: 137 mmol/L (ref 135–145)
Sodium: 136 mmol/L (ref 135–145)
Sodium: 137 mmol/L (ref 135–145)
Sodium: 137 mmol/L (ref 135–145)
Sodium: 140 mmol/L (ref 135–145)
TCO2: 26 mmol/L (ref 22–32)
TCO2: 25 mmol/L (ref 22–32)
TCO2: 26 mmol/L (ref 22–32)
TCO2: 25 mmol/L (ref 22–32)
TCO2: 26 mmol/L (ref 22–32)
TCO2: 25 mmol/L (ref 22–32)

## 2023-11-02 LAB — POCT I-STAT 7, (LYTES, BLD GAS, ICA,H+H)
Acid-Base Excess: 0 mmol/L (ref 0.0–2.0)
Acid-Base Excess: 0 mmol/L (ref 0.0–2.0)
Acid-base deficit: 1 mmol/L (ref 0.0–2.0)
Acid-base deficit: 2 mmol/L (ref 0.0–2.0)
Acid-base deficit: 2 mmol/L (ref 0.0–2.0)
Acid-base deficit: 3 mmol/L — ABNORMAL HIGH (ref 0.0–2.0)
Acid-base deficit: 2 mmol/L (ref 0.0–2.0)
Acid-base deficit: 2 mmol/L (ref 0.0–2.0)
Acid-base deficit: 3 mmol/L — ABNORMAL HIGH (ref 0.0–2.0)
Bicarbonate: 24.1 mmol/L (ref 20.0–28.0)
Bicarbonate: 24.7 mmol/L (ref 20.0–28.0)
Bicarbonate: 25.1 mmol/L (ref 20.0–28.0)
Bicarbonate: 22.9 mmol/L (ref 20.0–28.0)
Bicarbonate: 24.1 mmol/L (ref 20.0–28.0)
Bicarbonate: 24.7 mmol/L (ref 20.0–28.0)
Bicarbonate: 25.1 mmol/L (ref 20.0–28.0)
Bicarbonate: 23.4 mmol/L (ref 20.0–28.0)
Bicarbonate: 22.4 mmol/L (ref 20.0–28.0)
Calcium, Ion: 1.21 mmol/L (ref 1.15–1.40)
Calcium, Ion: 0.99 mmol/L — ABNORMAL LOW (ref 1.15–1.40)
Calcium, Ion: 1.1 mmol/L — ABNORMAL LOW (ref 1.15–1.40)
Calcium, Ion: 1.08 mmol/L — ABNORMAL LOW (ref 1.15–1.40)
Calcium, Ion: 1.05 mmol/L — ABNORMAL LOW (ref 1.15–1.40)
Calcium, Ion: 1.09 mmol/L — ABNORMAL LOW (ref 1.15–1.40)
Calcium, Ion: 1.12 mmol/L — ABNORMAL LOW (ref 1.15–1.40)
Calcium, Ion: 1.1 mmol/L — ABNORMAL LOW (ref 1.15–1.40)
Calcium, Ion: 1.1 mmol/L — ABNORMAL LOW (ref 1.15–1.40)
HCT: 25 % — ABNORMAL LOW (ref 39.0–52.0)
HCT: 20 % — ABNORMAL LOW (ref 39.0–52.0)
HCT: 24 % — ABNORMAL LOW (ref 39.0–52.0)
HCT: 24 % — ABNORMAL LOW (ref 39.0–52.0)
HCT: 26 % — ABNORMAL LOW (ref 39.0–52.0)
HCT: 22 % — ABNORMAL LOW (ref 39.0–52.0)
HCT: 23 % — ABNORMAL LOW (ref 39.0–52.0)
HCT: 23 % — ABNORMAL LOW (ref 39.0–52.0)
HCT: 24 % — ABNORMAL LOW (ref 39.0–52.0)
Hemoglobin: 8.5 g/dL — ABNORMAL LOW (ref 13.0–17.0)
Hemoglobin: 6.8 g/dL — CL (ref 13.0–17.0)
Hemoglobin: 8.2 g/dL — ABNORMAL LOW (ref 13.0–17.0)
Hemoglobin: 8.2 g/dL — ABNORMAL LOW (ref 13.0–17.0)
Hemoglobin: 8.8 g/dL — ABNORMAL LOW (ref 13.0–17.0)
Hemoglobin: 7.5 g/dL — ABNORMAL LOW (ref 13.0–17.0)
Hemoglobin: 7.8 g/dL — ABNORMAL LOW (ref 13.0–17.0)
Hemoglobin: 7.8 g/dL — ABNORMAL LOW (ref 13.0–17.0)
Hemoglobin: 8.2 g/dL — ABNORMAL LOW (ref 13.0–17.0)
O2 Saturation: 100 %
O2 Saturation: 100 %
O2 Saturation: 100 %
O2 Saturation: 100 %
O2 Saturation: 100 %
O2 Saturation: 88 %
O2 Saturation: 95 %
O2 Saturation: 96 %
O2 Saturation: 97 %
Patient temperature: 35.7
Patient temperature: 36.8
Patient temperature: 37.5
Patient temperature: 37.3
Potassium: 3.6 mmol/L (ref 3.5–5.1)
Potassium: 4.2 mmol/L (ref 3.5–5.1)
Potassium: 4.2 mmol/L (ref 3.5–5.1)
Potassium: 4.7 mmol/L (ref 3.5–5.1)
Potassium: 4.1 mmol/L (ref 3.5–5.1)
Potassium: 4.2 mmol/L (ref 3.5–5.1)
Potassium: 4 mmol/L (ref 3.5–5.1)
Potassium: 4.1 mmol/L (ref 3.5–5.1)
Potassium: 4.2 mmol/L (ref 3.5–5.1)
Sodium: 137 mmol/L (ref 135–145)
Sodium: 138 mmol/L (ref 135–145)
Sodium: 137 mmol/L (ref 135–145)
Sodium: 137 mmol/L (ref 135–145)
Sodium: 139 mmol/L (ref 135–145)
Sodium: 141 mmol/L (ref 135–145)
Sodium: 139 mmol/L (ref 135–145)
Sodium: 140 mmol/L (ref 135–145)
Sodium: 140 mmol/L (ref 135–145)
TCO2: 25 mmol/L (ref 22–32)
TCO2: 26 mmol/L (ref 22–32)
TCO2: 26 mmol/L (ref 22–32)
TCO2: 24 mmol/L (ref 22–32)
TCO2: 26 mmol/L (ref 22–32)
TCO2: 26 mmol/L (ref 22–32)
TCO2: 27 mmol/L (ref 22–32)
TCO2: 25 mmol/L (ref 22–32)
TCO2: 24 mmol/L (ref 22–32)
pCO2 arterial: 41.4 mm[Hg] (ref 32–48)
pCO2 arterial: 37.4 mm[Hg] (ref 32–48)
pCO2 arterial: 43.9 mm[Hg] (ref 32–48)
pCO2 arterial: 38.9 mm[Hg] (ref 32–48)
pCO2 arterial: 48.7 mm[Hg] — ABNORMAL HIGH (ref 32–48)
pCO2 arterial: 53.2 mm[Hg] — ABNORMAL HIGH (ref 32–48)
pCO2 arterial: 51 mm[Hg] — ABNORMAL HIGH (ref 32–48)
pCO2 arterial: 41.1 mm[Hg] (ref 32–48)
pCO2 arterial: 43 mm[Hg] (ref 32–48)
pH, Arterial: 7.372 (ref 7.35–7.45)
pH, Arterial: 7.429 (ref 7.35–7.45)
pH, Arterial: 7.366 (ref 7.35–7.45)
pH, Arterial: 7.378 (ref 7.35–7.45)
pH, Arterial: 7.303 — ABNORMAL LOW (ref 7.35–7.45)
pH, Arterial: 7.268 — ABNORMAL LOW (ref 7.35–7.45)
pH, Arterial: 7.299 — ABNORMAL LOW (ref 7.35–7.45)
pH, Arterial: 7.365 (ref 7.35–7.45)
pH, Arterial: 7.326 — ABNORMAL LOW (ref 7.35–7.45)
pO2, Arterial: 268 mm[Hg] — ABNORMAL HIGH (ref 83–108)
pO2, Arterial: 433 mm[Hg] — ABNORMAL HIGH (ref 83–108)
pO2, Arterial: 342 mm[Hg] — ABNORMAL HIGH (ref 83–108)
pO2, Arterial: 302 mm[Hg] — ABNORMAL HIGH (ref 83–108)
pO2, Arterial: 364 mm[Hg] — ABNORMAL HIGH (ref 83–108)
pO2, Arterial: 58 mm[Hg] — ABNORMAL LOW (ref 83–108)
pO2, Arterial: 83 mm[Hg] (ref 83–108)
pO2, Arterial: 89 mm[Hg] (ref 83–108)
pO2, Arterial: 103 mm[Hg] (ref 83–108)

## 2023-11-02 LAB — COMPREHENSIVE METABOLIC PANEL
ALT: 71 U/L — ABNORMAL HIGH (ref 0–44)
AST: 67 U/L — ABNORMAL HIGH (ref 15–41)
Albumin: 1.9 g/dL — ABNORMAL LOW (ref 3.5–5.0)
Alkaline Phosphatase: 69 U/L (ref 38–126)
Anion gap: 5 (ref 5–15)
BUN: 15 mg/dL (ref 8–23)
CO2: 26 mmol/L (ref 22–32)
Calcium: 8.1 mg/dL — ABNORMAL LOW (ref 8.9–10.3)
Chloride: 101 mmol/L (ref 98–111)
Creatinine, Ser: 0.75 mg/dL (ref 0.61–1.24)
GFR, Estimated: >60 mL/min (ref >60)
Glucose, Bld: 105 mg/dL — ABNORMAL HIGH (ref 70–99)
Potassium: 3.7 mmol/L (ref 3.5–5.1)
Sodium: 132 mmol/L — ABNORMAL LOW (ref 135–145)
Total Bilirubin: 0.4 mg/dL (ref <1.2)
Total Protein: 5.4 g/dL — ABNORMAL LOW (ref 6.5–8.1)

## 2023-11-02 LAB — URINALYSIS, ROUTINE W REFLEX MICROSCOPIC
Bilirubin Urine: NEGATIVE
Glucose, UA: NEGATIVE mg/dL
Ketones, ur: NEGATIVE mg/dL
Nitrite: NEGATIVE
Protein, ur: NEGATIVE mg/dL
Specific Gravity, Urine: 1.008 (ref 1.005–1.030)
pH: 7 (ref 5.0–8.0)

## 2023-11-02 LAB — GLUCOSE, CAPILLARY
Glucose-Capillary: 117 mg/dL — ABNORMAL HIGH (ref 70–99)
Glucose-Capillary: 125 mg/dL — ABNORMAL HIGH (ref 70–99)
Glucose-Capillary: 141 mg/dL — ABNORMAL HIGH (ref 70–99)
Glucose-Capillary: 104 mg/dL — ABNORMAL HIGH (ref 70–99)
Glucose-Capillary: 110 mg/dL — ABNORMAL HIGH (ref 70–99)
Glucose-Capillary: 103 mg/dL — ABNORMAL HIGH (ref 70–99)
Glucose-Capillary: 101 mg/dL — ABNORMAL HIGH (ref 70–99)
Glucose-Capillary: 107 mg/dL — ABNORMAL HIGH (ref 70–99)
Glucose-Capillary: 108 mg/dL — ABNORMAL HIGH (ref 70–99)

## 2023-11-02 LAB — CBC
HCT: 25.8 % — ABNORMAL LOW (ref 39.0–52.0)
HCT: 23.9 % — ABNORMAL LOW (ref 39.0–52.0)
HCT: 25.7 % — ABNORMAL LOW (ref 39.0–52.0)
HCT: 25.4 % — ABNORMAL LOW (ref 39.0–52.0)
Hemoglobin: 7.9 g/dL — ABNORMAL LOW (ref 13.0–17.0)
Hemoglobin: 7.5 g/dL — ABNORMAL LOW (ref 13.0–17.0)
Hemoglobin: 8.1 g/dL — ABNORMAL LOW (ref 13.0–17.0)
Hemoglobin: 8 g/dL — ABNORMAL LOW (ref 13.0–17.0)
MCH: 27.1 pg (ref 26.0–34.0)
MCH: 27.9 pg (ref 26.0–34.0)
MCH: 28 pg (ref 26.0–34.0)
MCH: 27.5 pg (ref 26.0–34.0)
MCHC: 30.6 g/dL (ref 30.0–36.0)
MCHC: 31.4 g/dL (ref 30.0–36.0)
MCHC: 31.5 g/dL (ref 30.0–36.0)
MCHC: 31.5 g/dL (ref 30.0–36.0)
MCV: 88.4 fL (ref 80.0–100.0)
MCV: 88.8 fL (ref 80.0–100.0)
MCV: 88.9 fL (ref 80.0–100.0)
MCV: 87.3 fL (ref 80.0–100.0)
Platelets: 234 10*3/uL (ref 150–400)
Platelets: 142 10*3/uL — ABNORMAL LOW (ref 150–400)
Platelets: 154 10*3/uL (ref 150–400)
Platelets: 171 10*3/uL (ref 150–400)
RBC: 2.92 MIL/uL — ABNORMAL LOW (ref 4.22–5.81)
RBC: 2.69 MIL/uL — ABNORMAL LOW (ref 4.22–5.81)
RBC: 2.89 MIL/uL — ABNORMAL LOW (ref 4.22–5.81)
RBC: 2.91 MIL/uL — ABNORMAL LOW (ref 4.22–5.81)
RDW: 14.6 % (ref 11.5–15.5)
RDW: 15.9 % — ABNORMAL HIGH (ref 11.5–15.5)
RDW: 16 % — ABNORMAL HIGH (ref 11.5–15.5)
RDW: 16.3 % — ABNORMAL HIGH (ref 11.5–15.5)
WBC: 8.4 10*3/uL (ref 4.0–10.5)
WBC: 19.5 10*3/uL — ABNORMAL HIGH (ref 4.0–10.5)
WBC: 20 10*3/uL — ABNORMAL HIGH (ref 4.0–10.5)
WBC: 21.7 10*3/uL — ABNORMAL HIGH (ref 4.0–10.5)
nRBC: 0 % (ref 0.0–0.2)
nRBC: 0 % (ref 0.0–0.2)
nRBC: 0 % (ref 0.0–0.2)
nRBC: 0 % (ref 0.0–0.2)

## 2023-11-02 LAB — POCT I-STAT EG7
Acid-Base Excess: 0 mmol/L (ref 0.0–2.0)
Bicarbonate: 24.5 mmol/L (ref 20.0–28.0)
Calcium, Ion: 1.04 mmol/L — ABNORMAL LOW (ref 1.15–1.40)
HCT: 25 % — ABNORMAL LOW (ref 39.0–52.0)
Hemoglobin: 8.5 g/dL — ABNORMAL LOW (ref 13.0–17.0)
O2 Saturation: 84 %
Potassium: 4.1 mmol/L (ref 3.5–5.1)
Sodium: 138 mmol/L (ref 135–145)
TCO2: 26 mmol/L (ref 22–32)
pCO2, Ven: 40 mm[Hg] — ABNORMAL LOW (ref 44–60)
pH, Ven: 7.395 (ref 7.25–7.43)
pO2, Ven: 48 mm[Hg] — ABNORMAL HIGH (ref 32–45)

## 2023-11-02 LAB — BASIC METABOLIC PANEL
Anion gap: 5 (ref 5–15)
Anion gap: 8 (ref 5–15)
BUN: 13 mg/dL (ref 8–23)
BUN: 17 mg/dL (ref 8–23)
CO2: 25 mmol/L (ref 22–32)
CO2: 22 mmol/L (ref 22–32)
Calcium: 7.9 mg/dL — ABNORMAL LOW (ref 8.9–10.3)
Calcium: 7.3 mg/dL — ABNORMAL LOW (ref 8.9–10.3)
Chloride: 102 mmol/L (ref 98–111)
Chloride: 106 mmol/L (ref 98–111)
Creatinine, Ser: 0.97 mg/dL (ref 0.61–1.24)
Creatinine, Ser: 0.76 mg/dL (ref 0.61–1.24)
GFR, Estimated: >60 mL/min (ref >60)
GFR, Estimated: >60 mL/min (ref >60)
Glucose, Bld: 100 mg/dL — ABNORMAL HIGH (ref 70–99)
Glucose, Bld: 137 mg/dL — ABNORMAL HIGH (ref 70–99)
Potassium: 3.3 mmol/L — ABNORMAL LOW (ref 3.5–5.1)
Potassium: 4 mmol/L (ref 3.5–5.1)
Sodium: 132 mmol/L — ABNORMAL LOW (ref 135–145)
Sodium: 136 mmol/L (ref 135–145)

## 2023-11-02 LAB — PLATELET COUNT: Platelets: 202 10*3/uL (ref 150–400)

## 2023-11-02 LAB — BLOOD GAS, ARTERIAL
Acid-Base Excess: 3.9 mmol/L — ABNORMAL HIGH (ref 0.0–2.0)
Bicarbonate: 27.7 mmol/L (ref 20.0–28.0)
O2 Saturation: 98.6 %
Patient temperature: 36.8
pCO2 arterial: 38 mm[Hg] (ref 32–48)
pH, Arterial: 7.47 — ABNORMAL HIGH (ref 7.35–7.45)
pO2, Arterial: 77 mm[Hg] — ABNORMAL LOW (ref 83–108)

## 2023-11-02 LAB — PROTIME-INR
INR: 1.1 (ref 0.8–1.2)
INR: 1.7 — ABNORMAL HIGH (ref 0.8–1.2)
Prothrombin Time: 14.7 s (ref 11.4–15.2)
Prothrombin Time: 19.9 s — ABNORMAL HIGH (ref 11.4–15.2)

## 2023-11-02 LAB — SURGICAL PCR SCREEN
MRSA, PCR: NEGATIVE
Staphylococcus aureus: NEGATIVE

## 2023-11-02 LAB — APTT
aPTT: 35 s (ref 24–36)
aPTT: 42 s — ABNORMAL HIGH (ref 24–36)

## 2023-11-02 LAB — HEMOGLOBIN A1C
Hgb A1c MFr Bld: 5.2 % (ref 4.8–5.6)
Mean Plasma Glucose: 102.54 mg/dL

## 2023-11-02 LAB — MAGNESIUM: Magnesium: 3.3 mg/dL — ABNORMAL HIGH (ref 1.7–2.4)

## 2023-11-02 LAB — HEMOGLOBIN AND HEMATOCRIT, BLOOD
HCT: 23.3 % — ABNORMAL LOW (ref 39.0–52.0)
Hemoglobin: 7.2 g/dL — ABNORMAL LOW (ref 13.0–17.0)

## 2023-11-02 SURGERY — REPLACEMENT, AORTIC VALVE, OPEN
Anesthesia: General | Site: Chest

## 2023-11-02 MED ORDER — MIDAZOLAM HCL (PF) 10 MG/2ML IJ SOLN
INTRAMUSCULAR | Status: AC
  Filled 2023-11-02: qty 2

## 2023-11-02 MED ORDER — ASPIRIN 81 MG PO CHEW: 324.0000 mg | CHEWABLE_TABLET | Freq: Every day | ORAL | Status: DC

## 2023-11-02 MED ORDER — LACTATED RINGERS IV SOLN: INTRAVENOUS | Status: DC | PRN

## 2023-11-02 MED ORDER — BISACODYL 10 MG RE SUPP: 10.0000 mg | Freq: Every day | RECTAL | Status: DC

## 2023-11-02 MED ORDER — HEPARIN SODIUM (PORCINE) 1000 UNIT/ML IJ SOLN
INTRAMUSCULAR | Status: DC | PRN
  Administered 2023-11-02: 23000 [IU] via INTRAVENOUS
  Administered 2023-11-02: 10000 [IU] via INTRAVENOUS

## 2023-11-02 MED ORDER — HEMOSTATIC AGENTS (NO CHARGE) OPTIME
TOPICAL | Status: DC | PRN
  Administered 2023-11-02: 1 via TOPICAL

## 2023-11-02 MED ORDER — FENTANYL CITRATE (PF) 250 MCG/5ML IJ SOLN
INTRAMUSCULAR | Status: AC
  Filled 2023-11-02: qty 5

## 2023-11-02 MED ORDER — ORAL CARE MOUTH RINSE
15.0000 mL | OROMUCOSAL | Status: DC
Start: 2023-11-02 — End: 2023-11-02
  Administered 2023-11-02: 15 mL via OROMUCOSAL

## 2023-11-02 MED ORDER — ROCURONIUM BROMIDE 10 MG/ML (PF) SYRINGE
PREFILLED_SYRINGE | INTRAVENOUS | Status: DC | PRN
  Administered 2023-11-02: 70 mg via INTRAVENOUS
  Administered 2023-11-02: 30 mg via INTRAVENOUS
  Administered 2023-11-02 (×2): 50 mg via INTRAVENOUS

## 2023-11-02 MED ORDER — ACETAMINOPHEN 500 MG PO TABS
1000.0000 mg | ORAL_TABLET | Freq: Four times a day (QID) | ORAL | Status: AC
  Administered 2023-11-03 – 2023-11-07 (×16): 1000 mg via ORAL
  Filled 2023-11-02 (×18): qty 2

## 2023-11-02 MED ORDER — PANTOPRAZOLE SODIUM 40 MG PO TBEC
40.0000 mg | DELAYED_RELEASE_TABLET | Freq: Every day | ORAL | Status: DC
  Administered 2023-11-04 – 2023-11-08 (×5): 40 mg via ORAL
  Filled 2023-11-02 (×5): qty 1

## 2023-11-02 MED ORDER — SODIUM CHLORIDE (PF) 0.9 % IJ SOLN: OROMUCOSAL | Status: DC | PRN

## 2023-11-02 MED ORDER — LACTATED RINGERS IV SOLN: INTRAVENOUS | Status: AC

## 2023-11-02 MED ORDER — ALBUMIN HUMAN 5 % IV SOLN: INTRAVENOUS | Status: DC | PRN

## 2023-11-02 MED ORDER — ONDANSETRON HCL 4 MG/2ML IJ SOLN
4.0000 mg | Freq: Four times a day (QID) | INTRAMUSCULAR | Status: DC | PRN
  Administered 2023-11-02: 4 mg via INTRAVENOUS
  Filled 2023-11-02 (×2): qty 2

## 2023-11-02 MED ORDER — ALBUMIN HUMAN 5 % IV SOLN: 250.0000 mL | INTRAVENOUS | Status: DC | PRN

## 2023-11-02 MED ORDER — ASPIRIN 81 MG PO CHEW
324.0000 mg | CHEWABLE_TABLET | Freq: Once | ORAL | Status: AC
  Administered 2023-11-02: 324 mg via ORAL
  Filled 2023-11-02: qty 4

## 2023-11-02 MED ORDER — SODIUM CHLORIDE 0.9 % IV SOLN: 250.0000 mL | INTRAVENOUS | Status: AC

## 2023-11-02 MED ORDER — SODIUM CHLORIDE 0.9 % IV SOLN: INTRAVENOUS | Status: AC

## 2023-11-02 MED ORDER — CEFAZOLIN SODIUM-DEXTROSE 2-4 GM/100ML-% IV SOLN
2.0000 g | Freq: Three times a day (TID) | INTRAVENOUS | Status: AC
  Administered 2023-11-02 – 2023-11-04 (×6): 2 g via INTRAVENOUS
  Filled 2023-11-02 (×6): qty 100

## 2023-11-02 MED ORDER — DEXMEDETOMIDINE HCL IN NACL 400 MCG/100ML IV SOLN: 0.0000 ug/kg/h | INTRAVENOUS | Status: DC

## 2023-11-02 MED ORDER — CHLORHEXIDINE GLUCONATE 0.12 % MT SOLN
OROMUCOSAL | Status: AC
  Administered 2023-11-02: 15 mL via OROMUCOSAL
  Filled 2023-11-02: qty 15

## 2023-11-02 MED ORDER — PLASMA-LYTE A IV SOLN
INTRAVENOUS | Status: DC | PRN
  Administered 2023-11-02: 600 mL

## 2023-11-02 MED ORDER — OXYCODONE HCL 5 MG PO TABS
5.0000 mg | ORAL_TABLET | ORAL | Status: DC | PRN
  Administered 2023-11-02 – 2023-11-03 (×2): 10 mg via ORAL
  Administered 2023-11-03: 5 mg via ORAL
  Administered 2023-11-03 (×2): 10 mg via ORAL
  Filled 2023-11-02: qty 2
  Filled 2023-11-02: qty 1
  Filled 2023-11-02 (×3): qty 2

## 2023-11-02 MED ORDER — HEPARIN SODIUM (PORCINE) 1000 UNIT/ML IJ SOLN
INTRAMUSCULAR | Status: AC
  Filled 2023-11-02: qty 1

## 2023-11-02 MED ORDER — POTASSIUM CHLORIDE 10 MEQ/50ML IV SOLN: 10.0000 meq | INTRAVENOUS | Status: AC

## 2023-11-02 MED ORDER — MORPHINE SULFATE (PF) 2 MG/ML IV SOLN
1.0000 mg | INTRAVENOUS | Status: DC | PRN
  Administered 2023-11-02: 2 mg via INTRAVENOUS
  Filled 2023-11-02 (×2): qty 1

## 2023-11-02 MED ORDER — ACETAMINOPHEN 160 MG/5ML PO SOLN
650.0000 mg | Freq: Once | ORAL | Status: AC
  Administered 2023-11-02: 650 mg
  Filled 2023-11-02: qty 20.3

## 2023-11-02 MED ORDER — METOPROLOL TARTRATE 5 MG/5ML IV SOLN: 2.5000 mg | INTRAVENOUS | Status: DC | PRN

## 2023-11-02 MED ORDER — ORAL CARE MOUTH RINSE
15.0000 mL | OROMUCOSAL | Status: DC | PRN
Start: 2023-11-02 — End: 2023-11-02

## 2023-11-02 MED ORDER — THROMBIN 20000 UNITS EX SOLR
CUTANEOUS | Status: DC | PRN
  Administered 2023-11-02: 20000 [IU] via TOPICAL

## 2023-11-02 MED ORDER — METOCLOPRAMIDE HCL 5 MG/ML IJ SOLN
10.0000 mg | Freq: Four times a day (QID) | INTRAMUSCULAR | Status: AC
  Administered 2023-11-02 – 2023-11-03 (×5): 10 mg via INTRAVENOUS
  Filled 2023-11-02 (×5): qty 2

## 2023-11-02 MED ORDER — ACETAMINOPHEN 160 MG/5ML PO SOLN: 1000.0000 mg | Freq: Four times a day (QID) | ORAL | Status: AC

## 2023-11-02 MED ORDER — VANCOMYCIN HCL IN DEXTROSE 1-5 GM/200ML-% IV SOLN
1000.0000 mg | Freq: Once | INTRAVENOUS | Status: AC
  Administered 2023-11-02: 1000 mg via INTRAVENOUS
  Filled 2023-11-02: qty 200

## 2023-11-02 MED ORDER — LIDOCAINE 2% (20 MG/ML) 5 ML SYRINGE
INTRAMUSCULAR | Status: AC
  Filled 2023-11-02: qty 5

## 2023-11-02 MED ORDER — 0.9 % SODIUM CHLORIDE (POUR BTL) OPTIME
TOPICAL | Status: DC | PRN
  Administered 2023-11-02: 5000 mL

## 2023-11-02 MED ORDER — SODIUM CHLORIDE 0.45 % IV SOLN: INTRAVENOUS | Status: AC | PRN

## 2023-11-02 MED ORDER — DEXTROSE 50 % IV SOLN: 0.0000 mL | INTRAVENOUS | Status: DC | PRN

## 2023-11-02 MED ORDER — PHENYLEPHRINE 80 MCG/ML (10ML) SYRINGE FOR IV PUSH (FOR BLOOD PRESSURE SUPPORT)
PREFILLED_SYRINGE | INTRAVENOUS | Status: AC
  Filled 2023-11-02: qty 10

## 2023-11-02 MED ORDER — ORAL CARE MOUTH RINSE: 15.0000 mL | OROMUCOSAL | Status: DC | PRN

## 2023-11-02 MED ORDER — DOCUSATE SODIUM 100 MG PO CAPS
200.0000 mg | ORAL_CAPSULE | Freq: Every day | ORAL | Status: DC
  Administered 2023-11-03 – 2023-11-04 (×2): 200 mg via ORAL
  Filled 2023-11-02 (×3): qty 2

## 2023-11-02 MED ORDER — FENTANYL CITRATE (PF) 250 MCG/5ML IJ SOLN
INTRAMUSCULAR | Status: DC | PRN
  Administered 2023-11-02: 150 ug via INTRAVENOUS
  Administered 2023-11-02: 100 ug via INTRAVENOUS
  Administered 2023-11-02: 150 ug via INTRAVENOUS
  Administered 2023-11-02 (×2): 100 ug via INTRAVENOUS
  Administered 2023-11-02: 150 ug via INTRAVENOUS
  Administered 2023-11-02: 100 ug via INTRAVENOUS
  Administered 2023-11-02 (×2): 150 ug via INTRAVENOUS
  Administered 2023-11-02: 100 ug via INTRAVENOUS

## 2023-11-02 MED ORDER — MAGNESIUM SULFATE 4 GM/100ML IV SOLN
4.0000 g | Freq: Once | INTRAVENOUS | Status: AC
  Administered 2023-11-02: 4 g via INTRAVENOUS
  Filled 2023-11-02: qty 100

## 2023-11-02 MED ORDER — MIDAZOLAM HCL (PF) 5 MG/ML IJ SOLN
INTRAMUSCULAR | Status: DC | PRN
  Administered 2023-11-02 (×2): 2 mg via INTRAVENOUS
  Administered 2023-11-02: 1 mg via INTRAVENOUS

## 2023-11-02 MED ORDER — PROPOFOL 10 MG/ML IV BOLUS
INTRAVENOUS | Status: DC | PRN
  Administered 2023-11-02: 80 mg via INTRAVENOUS
  Administered 2023-11-02 (×3): 20 mg via INTRAVENOUS

## 2023-11-02 MED ORDER — CHLORHEXIDINE GLUCONATE 0.12 % MT SOLN
15.0000 mL | OROMUCOSAL | Status: AC
  Administered 2023-11-02: 15 mL via OROMUCOSAL
  Filled 2023-11-02: qty 15

## 2023-11-02 MED ORDER — CHLORHEXIDINE GLUCONATE CLOTH 2 % EX PADS
6.0000 | MEDICATED_PAD | Freq: Every day | CUTANEOUS | Status: DC
  Administered 2023-11-02 – 2023-11-04 (×3): 6 via TOPICAL

## 2023-11-02 MED ORDER — MIDAZOLAM HCL 2 MG/2ML IJ SOLN: 2.0000 mg | INTRAMUSCULAR | Status: DC | PRN

## 2023-11-02 MED ORDER — CHLORHEXIDINE GLUCONATE 0.12 % MT SOLN: 15.0000 mL | OROMUCOSAL | Status: AC

## 2023-11-02 MED ORDER — PHENYLEPHRINE 80 MCG/ML (10ML) SYRINGE FOR IV PUSH (FOR BLOOD PRESSURE SUPPORT)
PREFILLED_SYRINGE | INTRAVENOUS | Status: DC | PRN
  Administered 2023-11-02: 160 ug via INTRAVENOUS
  Administered 2023-11-02 (×2): 40 ug via INTRAVENOUS
  Administered 2023-11-02: 80 ug via INTRAVENOUS
  Administered 2023-11-02: 40 ug via INTRAVENOUS
  Administered 2023-11-02: 80 ug via INTRAVENOUS
  Administered 2023-11-02: 40 ug via INTRAVENOUS
  Administered 2023-11-02: 80 ug via INTRAVENOUS
  Administered 2023-11-02: 40 ug via INTRAVENOUS

## 2023-11-02 MED ORDER — PANTOPRAZOLE SODIUM 40 MG IV SOLR
40.0000 mg | Freq: Every day | INTRAVENOUS | Status: AC
  Administered 2023-11-02 – 2023-11-03 (×2): 40 mg via INTRAVENOUS
  Filled 2023-11-02 (×2): qty 10

## 2023-11-02 MED ORDER — PHENYLEPHRINE HCL-NACL 20-0.9 MG/250ML-% IV SOLN
0.0000 ug/min | INTRAVENOUS | Status: DC
  Administered 2023-11-02: 50 ug/min via INTRAVENOUS
  Filled 2023-11-02: qty 250

## 2023-11-02 MED ORDER — PROTAMINE SULFATE 10 MG/ML IV SOLN
INTRAVENOUS | Status: DC | PRN
  Administered 2023-11-02: 20 mg via INTRAVENOUS
  Administered 2023-11-02: 230 mg via INTRAVENOUS

## 2023-11-02 MED ORDER — ROCURONIUM BROMIDE 10 MG/ML (PF) SYRINGE
PREFILLED_SYRINGE | INTRAVENOUS | Status: AC
  Filled 2023-11-02: qty 10

## 2023-11-02 MED ORDER — TRAMADOL HCL 50 MG PO TABS: 50.0000 mg | ORAL_TABLET | ORAL | Status: DC | PRN

## 2023-11-02 MED ORDER — SODIUM CHLORIDE 0.9% FLUSH
3.0000 mL | Freq: Two times a day (BID) | INTRAVENOUS | Status: DC
  Administered 2023-11-03 – 2023-11-04 (×4): 3 mL via INTRAVENOUS

## 2023-11-02 MED ORDER — METOPROLOL TARTRATE 12.5 MG HALF TABLET
12.5000 mg | ORAL_TABLET | Freq: Two times a day (BID) | ORAL | Status: DC
  Administered 2023-11-03: 12.5 mg via ORAL
  Filled 2023-11-02: qty 1

## 2023-11-02 MED ORDER — SODIUM BICARBONATE 8.4 % IV SOLN
50.0000 meq | Freq: Once | INTRAVENOUS | Status: AC
  Administered 2023-11-02: 50 meq via INTRAVENOUS

## 2023-11-02 MED ORDER — SODIUM CHLORIDE 0.9% FLUSH: 3.0000 mL | INTRAVENOUS | Status: DC | PRN

## 2023-11-02 MED ORDER — INSULIN ASPART 100 UNIT/ML IJ SOLN
0.0000 [IU] | INTRAMUSCULAR | Status: DC
  Administered 2023-11-03: 2 [IU] via SUBCUTANEOUS

## 2023-11-02 MED ORDER — ASPIRIN 325 MG PO TBEC: 325.0000 mg | DELAYED_RELEASE_TABLET | Freq: Every day | ORAL | Status: DC

## 2023-11-02 MED ORDER — METOPROLOL TARTRATE 25 MG/10 ML ORAL SUSPENSION: 12.5000 mg | Freq: Two times a day (BID) | ORAL | Status: DC

## 2023-11-02 MED ORDER — SODIUM CHLORIDE (PF) 0.9 % IJ SOLN
INTRAMUSCULAR | Status: AC
  Filled 2023-11-02: qty 10

## 2023-11-02 MED ORDER — SODIUM CHLORIDE 0.9 % IV SOLN
20.0000 ug | Freq: Once | INTRAVENOUS | Status: AC
  Administered 2023-11-02: 20 ug via INTRAVENOUS
  Filled 2023-11-02: qty 5

## 2023-11-02 MED ORDER — PROPOFOL 10 MG/ML IV BOLUS
INTRAVENOUS | Status: AC
  Filled 2023-11-02: qty 20

## 2023-11-02 MED ORDER — SODIUM CHLORIDE 0.9 % IV SOLN: INTRAVENOUS | Status: DC | PRN

## 2023-11-02 MED ORDER — BISACODYL 5 MG PO TBEC
10.0000 mg | DELAYED_RELEASE_TABLET | Freq: Every day | ORAL | Status: DC
  Administered 2023-11-03 – 2023-11-04 (×2): 10 mg via ORAL
  Filled 2023-11-02 (×3): qty 2

## 2023-11-02 MED ORDER — THROMBIN (RECOMBINANT) 20000 UNITS EX SOLR
CUTANEOUS | Status: AC
  Filled 2023-11-02: qty 20000

## 2023-11-02 MED ORDER — INSULIN REGULAR(HUMAN) IN NACL 100-0.9 UT/100ML-% IV SOLN: INTRAVENOUS | Status: DC

## 2023-11-02 MED ORDER — PROTAMINE SULFATE 10 MG/ML IV SOLN
INTRAVENOUS | Status: AC
  Filled 2023-11-02: qty 25

## 2023-11-02 SURGICAL SUPPLY — 128 items
ADAPTER CARDIO PERF ANTE/RETRO (ADAPTER) ×3 IMPLANT
ANTIFOG SOL W/FOAM PAD STRL (MISCELLANEOUS) ×2
BAG DECANTER FOR FLEXI CONT (MISCELLANEOUS) ×3 IMPLANT
BLADE CLIPPER SURG (BLADE) ×3 IMPLANT
BLADE STERNUM SYSTEM 6 (BLADE) ×3 IMPLANT
BLADE SURG 11 STRL SS (BLADE) IMPLANT
BLADE SURG 15 STRL LF DISP TIS (BLADE) ×3 IMPLANT
BLADE SURG 15 STRL SS (BLADE) ×4
BNDG ELASTIC 4INX 5YD STR LF (GAUZE/BANDAGES/DRESSINGS) IMPLANT
BNDG ELASTIC 4X5.8 VLCR STR LF (GAUZE/BANDAGES/DRESSINGS) ×3 IMPLANT
BNDG ELASTIC 6INX 5YD STR LF (GAUZE/BANDAGES/DRESSINGS) IMPLANT
BNDG ELASTIC 6X5.8 VLCR STR LF (GAUZE/BANDAGES/DRESSINGS) ×3 IMPLANT
BNDG GAUZE DERMACEA FLUFF 4 (GAUZE/BANDAGES/DRESSINGS) ×3 IMPLANT
CANISTER SUCT 3000ML PPV (MISCELLANEOUS) ×3 IMPLANT
CANN PRFSN .5XCNCT 15X34-48 (MISCELLANEOUS) ×2
CANNULA ARTERIAL VENT 3/8 20FR (CANNULA) IMPLANT
CANNULA GUNDRY RCSP 15FR (MISCELLANEOUS) ×3 IMPLANT
CANNULA LEFT HEART VENT 20FR (CATHETERS) IMPLANT
CANNULA PRFSN .5XCNCT 15X34-48 (MISCELLANEOUS) IMPLANT
CANNULA VEN 2 STAGE (MISCELLANEOUS) ×2
CATH HEART VENT LEFT (CATHETERS) ×3 IMPLANT
CATH ROBINSON RED A/P 18FR (CATHETERS) ×9 IMPLANT
CATH THORACIC 28FR (CATHETERS) ×3 IMPLANT
CATH THORACIC 36FR (CATHETERS) ×3 IMPLANT
CATH THORACIC 36FR RT ANG (CATHETERS) ×3 IMPLANT
CLIP TI MEDIUM 24 (CLIP) IMPLANT
CLIP TI WIDE RED SMALL 24 (CLIP) IMPLANT
CNTNR URN SCR LID CUP LEK RST (MISCELLANEOUS) ×3 IMPLANT
CONT SPEC 4OZ STRL OR WHT (MISCELLANEOUS) ×2
CONTAINER PROTECT SURGISLUSH (MISCELLANEOUS) ×6 IMPLANT
COVER SURGICAL LIGHT HANDLE (MISCELLANEOUS) ×3 IMPLANT
DERMABOND ADVANCED .7 DNX12 (GAUZE/BANDAGES/DRESSINGS) IMPLANT
DEVICE SUT CK QUICK LOAD MINI (Prosthesis & Implant Heart) IMPLANT
DRAPE CARDIOVASCULAR INCISE (DRAPES) ×2
DRAPE SRG 135X102X78XABS (DRAPES) ×3 IMPLANT
DRAPE WARM FLUID 44X44 (DRAPES) ×3 IMPLANT
DRSG COVADERM 4X14 (GAUZE/BANDAGES/DRESSINGS) ×3 IMPLANT
ELECT CAUTERY BLADE 6.4 (BLADE) ×3 IMPLANT
ELECT REM PT RETURN 9FT ADLT (ELECTROSURGICAL) ×4
ELECTRODE REM PT RTRN 9FT ADLT (ELECTROSURGICAL) ×6 IMPLANT
FELT TEFLON 1X6 (MISCELLANEOUS) ×6 IMPLANT
GAUZE 4X4 16PLY ~~LOC~~+RFID DBL (SPONGE) ×3 IMPLANT
GAUZE SPONGE 4X4 12PLY STRL (GAUZE/BANDAGES/DRESSINGS) ×6 IMPLANT
GLOVE BIO SURGEON STRL SZ 6 (GLOVE) IMPLANT
GLOVE BIO SURGEON STRL SZ 6.5 (GLOVE) IMPLANT
GLOVE BIO SURGEON STRL SZ7 (GLOVE) IMPLANT
GLOVE BIO SURGEON STRL SZ7.5 (GLOVE) IMPLANT
GLOVE BIOGEL PI IND STRL 6 (GLOVE) IMPLANT
GLOVE BIOGEL PI IND STRL 6.5 (GLOVE) IMPLANT
GLOVE BIOGEL PI IND STRL 7.0 (GLOVE) IMPLANT
GLOVE ECLIPSE 7.0 STRL STRAW (GLOVE) ×6 IMPLANT
GLOVE ORTHO TXT STRL SZ7.5 (GLOVE) IMPLANT
GLOVE SURG MICRO LTX SZ7 (GLOVE) ×6 IMPLANT
GOWN STRL REUS W/ TWL LRG LVL3 (GOWN DISPOSABLE) ×12 IMPLANT
GOWN STRL REUS W/ TWL XL LVL3 (GOWN DISPOSABLE) ×3 IMPLANT
GOWN STRL REUS W/TWL LRG LVL3 (GOWN DISPOSABLE) ×8
GOWN STRL REUS W/TWL XL LVL3 (GOWN DISPOSABLE) ×2
HEMOSTAT POWDER SURGIFOAM 1G (HEMOSTASIS) ×9 IMPLANT
HEMOSTAT SURGICEL 2X14 (HEMOSTASIS) ×3 IMPLANT
INSERT FOGARTY 61MM (MISCELLANEOUS) IMPLANT
INSERT FOGARTY XLG (MISCELLANEOUS) IMPLANT
KIT BASIN OR (CUSTOM PROCEDURE TRAY) ×3 IMPLANT
KIT CATH CPB BARTLE (MISCELLANEOUS) ×3 IMPLANT
KIT SUCTION CATH 14FR (SUCTIONS) ×3 IMPLANT
KIT SUT CK MINI COMBO 4X17 (Prosthesis & Implant Heart) IMPLANT
KIT TURNOVER KIT B (KITS) ×3 IMPLANT
KIT VASOVIEW HEMOPRO 2 VH 4000 (KITS) ×3 IMPLANT
KNIFE MICRO-UNI 3.5 30 DEG (BLADE) ×3 IMPLANT
LINE VENT (MISCELLANEOUS) IMPLANT
MARKER GRAFT CORONARY BYPASS (MISCELLANEOUS) IMPLANT
NS IRRIG 1000ML POUR BTL (IV SOLUTION) ×18 IMPLANT
PACK E OPEN HEART (SUTURE) ×3 IMPLANT
PACK OPEN HEART (CUSTOM PROCEDURE TRAY) ×3 IMPLANT
PAD ARMBOARD 7.5X6 YLW CONV (MISCELLANEOUS) ×6 IMPLANT
PAD ELECT DEFIB RADIOL ZOLL (MISCELLANEOUS) ×3 IMPLANT
PENCIL BUTTON HOLSTER BLD 10FT (ELECTRODE) ×3 IMPLANT
POSITIONER HEAD DONUT 9IN (MISCELLANEOUS) ×3 IMPLANT
PUNCH AORTIC ROTATE 4.0MM (MISCELLANEOUS) IMPLANT
PUNCH AORTIC ROTATE 4.5MM 8IN (MISCELLANEOUS) ×3 IMPLANT
PUNCH AORTIC ROTATE 5MM 8IN (MISCELLANEOUS) IMPLANT
SET MPS 3-ND DEL (MISCELLANEOUS) IMPLANT
SOLUTION ANTFG W/FOAM PAD STRL (MISCELLANEOUS) IMPLANT
SPONGE INTESTINAL PEANUT (DISPOSABLE) IMPLANT
SPONGE T-LAP 18X18 ~~LOC~~+RFID (SPONGE) ×12 IMPLANT
SPONGE T-LAP 4X18 ~~LOC~~+RFID (SPONGE) ×3 IMPLANT
SUPPORT HEART JANKE-BARRON (MISCELLANEOUS) ×3 IMPLANT
SUT BONE WAX W31G (SUTURE) ×3 IMPLANT
SUT EB EXC GRN/WHT 2-0 V-5 (SUTURE) ×6 IMPLANT
SUT ETHIBON EXCEL 2-0 V-5 (SUTURE) IMPLANT
SUT ETHIBOND V-5 VALVE (SUTURE) IMPLANT
SUT ETHIBOND X763 2 0 SH 1 (SUTURE) IMPLANT
SUT MNCRL AB 4-0 PS2 18 (SUTURE) IMPLANT
SUT PROLENE 3 0 SH DA (SUTURE) IMPLANT
SUT PROLENE 3 0 SH1 36 (SUTURE) ×3 IMPLANT
SUT PROLENE 4 0 RB 1 (SUTURE) ×10
SUT PROLENE 4 0 SH DA (SUTURE) IMPLANT
SUT PROLENE 4-0 RB1 .5 CRCL 36 (SUTURE) ×9 IMPLANT
SUT PROLENE 5 0 C 1 36 (SUTURE) IMPLANT
SUT PROLENE 6 0 C 1 30 (SUTURE) IMPLANT
SUT PROLENE 7 0 BV 1 (SUTURE) IMPLANT
SUT PROLENE 7 0 BV1 MDA (SUTURE) ×3 IMPLANT
SUT PROLENE 8 0 BV175 6 (SUTURE) IMPLANT
SUT SILK 1 MH (SUTURE) IMPLANT
SUT SILK 2 0 SH (SUTURE) IMPLANT
SUT SILK 2 0 SH CR/8 (SUTURE) IMPLANT
SUT STEEL 6MS V (SUTURE) IMPLANT
SUT STEEL STERNAL CCS#1 18IN (SUTURE) IMPLANT
SUT STEEL SZ 6 DBL 3X14 BALL (SUTURE) IMPLANT
SUT VIC AB 1 CTX 36 (SUTURE) ×6
SUT VIC AB 1 CTX36XBRD ANBCTR (SUTURE) ×6 IMPLANT
SUT VIC AB 2-0 CT1 27 (SUTURE) ×2
SUT VIC AB 2-0 CT1 TAPERPNT 27 (SUTURE) IMPLANT
SUT VIC AB 2-0 CTX 27 (SUTURE) IMPLANT
SUT VIC AB 3-0 SH 27 (SUTURE)
SUT VIC AB 3-0 SH 27X BRD (SUTURE) IMPLANT
SUT VIC AB 3-0 X1 27 (SUTURE) IMPLANT
SUT VICRYL 4-0 PS2 18IN ABS (SUTURE) IMPLANT
SYSTEM SAHARA CHEST DRAIN ATS (WOUND CARE) ×3 IMPLANT
TAPE CLOTH SURG 4X10 WHT LF (GAUZE/BANDAGES/DRESSINGS) IMPLANT
TAPE PAPER 3X10 WHT MICROPORE (GAUZE/BANDAGES/DRESSINGS) IMPLANT
TOWEL GREEN STERILE (TOWEL DISPOSABLE) ×3 IMPLANT
TOWEL GREEN STERILE FF (TOWEL DISPOSABLE) ×3 IMPLANT
TRAY FOLEY SLVR 16FR TEMP STAT (SET/KITS/TRAYS/PACK) ×3 IMPLANT
TUBING LAP HI FLOW INSUFFLATIO (TUBING) ×3 IMPLANT
UNDERPAD 30X36 HEAVY ABSORB (UNDERPADS AND DIAPERS) ×3 IMPLANT
VALVE AORTIC SZ23 INSP/RESIL (Prosthesis & Implant Heart) IMPLANT
VENT LEFT HEART 12002 (CATHETERS) ×2
WATER STERILE IRR 1000ML POUR (IV SOLUTION) ×6 IMPLANT

## 2023-11-02 NOTE — Anesthesia Procedure Notes (Signed)
Arterial Line Insertion Start/End11/14/2024 7:05 AM, 11/02/2023 7:15 AM Performed by: Randon Goldsmith, CRNA, CRNA  Patient location: Pre-op. Preanesthetic checklist: patient identified, IV checked, site marked, risks and benefits discussed, surgical consent, monitors and equipment checked, pre-op evaluation, timeout performed and anesthesia consent Lidocaine 1% used for infiltration Left, radial was placed Catheter size: 20 G Hand hygiene performed , maximum sterile barriers used  and Seldinger technique used Allen's test indicative of satisfactory collateral circulation Attempts: 2 Procedure performed without using ultrasound guided technique. Following insertion, dressing applied. Post procedure assessment: normal and unchanged  Post procedure complications: second provider assisted and unsuccessful attempts. Patient tolerated the procedure well with no immediate complications.

## 2023-11-02 NOTE — Progress Notes (Signed)
Called OR for G2 drink that patient is to have 3 hours before procedure and they stated that they don't have it and to call short stay at 0500.

## 2023-11-02 NOTE — Progress Notes (Signed)
Called radiology many times for preop chest xray but received no answer. Changed order to stat.

## 2023-11-02 NOTE — Progress Notes (Signed)
Patient went to the OR early morning before 7 am for planned surgery We will transfer the patient to cardiothoracic surgery POST OP. Please reach out to [facility] is any medical need.

## 2023-11-02 NOTE — Anesthesia Procedure Notes (Signed)
Central Venous Catheter Insertion Performed by: Achille Rich, MD, anesthesiologist Start/End11/14/2024 7:10 AM, 11/02/2023 7:22 AM Patient location: Pre-op. Preanesthetic checklist: patient identified, IV checked, site marked, risks and benefits discussed, surgical consent, monitors and equipment checked, pre-op evaluation, timeout performed and anesthesia consent Lidocaine 1% used for infiltration and patient sedated Hand hygiene performed  and maximum sterile barriers used  Catheter size: 8.5 Fr Sheath introducer Procedure performed using ultrasound guided technique. Ultrasound Notes:anatomy identified, needle tip was noted to be adjacent to the nerve/plexus identified, no ultrasound evidence of intravascular and/or intraneural injection and image(s) printed for medical record Attempts: 1 Following insertion, line sutured and dressing applied. Post procedure assessment: blood return through all ports, free fluid flow and no air  Patient tolerated the procedure well with no immediate complications.

## 2023-11-02 NOTE — Plan of Care (Signed)

## 2023-11-02 NOTE — Hospital Course (Addendum)
History of Present Illness:  Kyle Matthews is a 77 y.o. 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 Lynnette Caffey. Kyle Matthews has been known to have aortic valve disease since 2018. Echo at [facility] on 10/10/22 showed EF 65-70%, mild MR, severe AS with a mean gradient of 43 mm hg and moderate AI. There was a question of a TIA at the time with transient leg weakness. CTA head and neck was unremarkable. Brain MRI showed chronic lacunar infarcts chronic microvascular ischemic disease.    He was seen in the office by Carolan Clines MD on 04/28/23 and noted to have transient chest pain and nausea but overall felt to be asymptomatic and continued surveillance of aortic valve was recommended.    He has issues with BPH with chronic bladder outlet obstruction and urinary retention requiring an indwelling foley. He had AKI with a creatinine of 8.5 in August of this year. CT abdomen on 08/17/23 showed severe hydroureteronephrosis bilaterally with no obstructing stone. Prostate gland was enlarged in the urinary bladder is markedly distended suggesting bladder outlet obstruction. Renal function normalized after foley was placed; however, he has had ongoing issues with hematuria with the foley. He presented to [facility] on 09/23/2023 with bloody drainage. Tubing was replaced and he was treated with Keflex. He is awaiting robotic assisted prostatectomy on 10/25 with Dr. Berneice Heinrich (now cancelled).   He re-presented to [facility] on 10/03/23 with chest pain, shortness of breath and LE edema x3 weeks. He was noted to be hypoxic with 02 sats 82% on RA as well as acute anemia and PNA. Pertinent labs: Hg 6.5, BNP 547, hsTrop peak 4814. CTA of the chest was negative for  pulmonary embolism but possible multifocal pneumonia. He was treated with IV abx. Heparin held given acute anemia and transfused 3U PRBCs and iron with improvement of Hg to 10.6. Anemia felt to be 2/2 hematuria and not hemolysis (LDH negative). He was transferred to [facility] for further work up and evaluation. Repeat echo showed EF 55-60%, mild to mod MR, and severe AS with mean gradient 42 mm hg, peak gradient 69.2 mm hg, AVA 0.89 cm2 as well as moderate to severe AI. He underwent [facility] 10/08/21 which showed 50% Ost LM lesion with an RFR of 0.83 and moderate diffuse disease of the right coronary artery. Cardiac gated CTA of the heart revealed anatomical characteristics consistent with aortic stenosis suitable for treatment by transcatheter aortic valve replacement without any significant complicating features and CTA of the aorta and iliac vessels demonstrated what appear to be adequate pelvic vascular access to facilitate a transfemoral approach.     The structural heart team was consulted for consideration of TAVR/PCI vs SAVR/CABG. He was seen by Dr. Lynnette Caffey who felt that TAVR/PCI of the left main would obligate dual antiplatelet therapy for a minimum of 3 months and increase risk of further hematuria, need for blood transfusions, and extend the duration until when he could obtain his prostatectomy.  The alternative is surgical aortic valve replacement with CABG, which he favored given above issues. Surgical consultation was requested as a part of a multidisciplinary approach to his care.  He was evaluated by Dr. Laneta Simmers who felt patient would benefit from open surgical intervention in  setting of CAD with AS.  The risks and benefits of the procedure were explained to the patient and he was agreeable to proceed.  Hospital Course:  Kyle Matthews presented to the [facility] ED on 11/4 with complaints of chest pain.  EKG obtained showed diffuse ST depression and elevation in aVR.  It was felt he should be admitted the  hospital.  Workup also revealed a hemoglobin level of 5.4 due to gross hematuria from significantly enlarged prostate.  He was transfused and urology consult was obtained which they recommended Firmagon to help with bleeding, however he ultimately ended up requiring prostate artery embolization.  He will require foley catheter throughout his hospital stay and this should stay in place until discharge.  He has remained stable without chest pain during hospitalization.  He was stable and taken to the operating room on 11/02/2023.  He underwent CABG x 2 utilizing LIMA to LAD and SVG to Ramus intermediate.  He underwent Aortic Valve Replacement with a 23 mm Edwards Resilia Bioprosthetic Valve.  Finally he underwent endoscopic harvest of greater saphenous vine from his right thigh.  He tolerated the procedure without difficulty and was taken to the SICU in stable condition.  He was extubated without difficulty.  He was in NSR and started on Lopressor.  He was started on lasix to facilitate diuresis.  The patient has an enlarged prostate with chronic hematuria.  He required foley placement due to bladder outlet obstruction.  This foley will remain in place and required frequent irrigation.  His chest tubes and arterial lines without difficulty.  He was started on iron for his chronic anemia due to hematuria with exacerbation of recent surgery.  His hemoglobin level dropped to 6.6 felt to be related to red cell attrition.  He was transfused 2 units of packed cells.  His repeat H/H was 8.4.  He developed elevation in his creatinine level likely due to chronic anemia, and this resolved without issue.  He was stable for transfer to the progressive care unit on 11/05/2023.  He developed Atrial Fibrillation with RVR treated with Amiodarone bolus and drip protocol.  He converted to NSR and will be transitioned to oral Amiodarone regimen.  PT/OT consult was obtained and they recommended ***.

## 2023-11-02 NOTE — Procedures (Signed)
Extubation Procedure Note  Patient Details:   Name: Kyle Matthews DOB: 07/13/1946 MRN: [id number]   Airway Documentation:    Vent end date: 11/02/23 Vent end time: 1818   Evaluation  O2 sats: stable throughout Complications: No apparent complications Patient did tolerate procedure well. Bilateral Breath Sounds: Coarse crackles   Yes  Pt extubated per rapid wean protocol to 4L [name]. Pt passed all parameters with VC 0.72L and NIF -25. Pt had a positive cuff leak, good cough and able to state name.   Kerri Perches 11/02/2023, 6:18 PM

## 2023-11-02 NOTE — Anesthesia Preprocedure Evaluation (Signed)
Anesthesia Evaluation  Patient identified by MRN, date of birth, ID band Patient awake    Reviewed: Allergy & Precautions, H&P , NPO status , Patient's Chart, lab work & pertinent test results  Airway Mallampati: II   Neck ROM: full    Dental   Pulmonary Current Smoker   breath sounds clear to auscultation       Cardiovascular hypertension, + CAD, + Past MI and +CHF  + Valvular Problems/Murmurs AS and AI  Rhythm:regular Rate:Normal     Neuro/Psych  Neuromuscular disease CVA    GI/Hepatic ,GERD  ,,  Endo/Other    Renal/GU      Musculoskeletal   Abdominal   Peds  Hematology   Anesthesia Other Findings   Reproductive/Obstetrics                             Anesthesia Physical Anesthesia Plan  ASA: 3  Anesthesia Plan: General   Post-op Pain Management:    Induction: Intravenous  PONV Risk Score and Plan: 1 and Ondansetron, Dexamethasone, Midazolam and Treatment may vary due to age or medical condition  Airway Management Planned: Oral ETT  Additional Equipment: Arterial line, CVP, PA Cath, TEE and Ultrasound Guidance Line Placement  Intra-op Plan:   Post-operative Plan: Post-operative intubation/ventilation  Informed Consent: I have reviewed the patients History and Physical, chart, labs and discussed the procedure including the risks, benefits and alternatives for the proposed anesthesia with the patient or authorized representative who has indicated his/her understanding and acceptance.     Dental advisory given  Plan Discussed with: CRNA, Anesthesiologist and Surgeon  Anesthesia Plan Comments:        Anesthesia Quick Evaluation

## 2023-11-02 NOTE — Anesthesia Procedure Notes (Signed)
Procedure Name: Intubation Date/Time: 11/02/2023 7:50 AM  Performed by: Yolonda Kida, CRNAPre-anesthesia Checklist: Patient identified, Emergency Drugs available, Suction available and Patient being monitored Patient Re-evaluated:Patient Re-evaluated prior to induction Oxygen Delivery Method: Circle System Utilized Preoxygenation: Pre-oxygenation with 100% oxygen Induction Type: IV induction Ventilation: Mask ventilation without difficulty Laryngoscope Size: Mac and 4 Grade View: Grade I Tube type: Oral Tube size: 8.0 mm Number of attempts: 1 Airway Equipment and Method: Stylet Placement Confirmation: ETT inserted through vocal cords under direct vision, positive ETCO2 and breath sounds checked- equal and bilateral Secured at: 24 cm Tube secured with: Tape Dental Injury: Teeth and Oropharynx as per pre-operative assessment

## 2023-11-02 NOTE — Brief Op Note (Signed)
10/23/2023 - 11/02/2023  7:13 AM  PATIENT:  Kyle Matthews  77 y.o. male  PRE-OPERATIVE DIAGNOSIS:  SEVERE AORTIC STENOSIS, AORTIC INSUFFICIENCY, CORONARY ARTERY DISEASE  POST-OPERATIVE DIAGNOSIS:  SEVERE AORTIC STENOSIS, AORTIC INSUFFICIENCY, CORONARY ARTERY DISEASE  PROCEDURE:  Procedure(s):  AORTIC VALVE REPLACEMENT  -23 mm Edwards Inspiris Resilia Bioprosthetic Vavle  CORONARY ARTERY BYPASS GRAFTING x 2 -LIMA to LAD -SVG to RAMUS  TRANSESOPHAGEAL ECHOCARDIOGRAM (N/A)  ENDOSCOPIC HARVEST GREATER SAPHENOUS VEIN -Right Thigh Vein harvest time: 10 min Vein prep time: 5 min  SURGEON:  Surgeons and Role:    * Alleen Borne, MD - Primary  PHYSICIAN ASSISTANT: Lowella Dandy PA-C  ASSISTANTS: Darlina Rumpf RNFA   ANESTHESIA:   general  EBL:  1289 mL   BLOOD ADMINISTERED:   CC CELLSAVER  DRAINS:  Left Pleural Chest Tube, Mediastinal Chest Drain    LOCAL MEDICATIONS USED:  NONE  SPECIMEN:  Source of Specimen:  Aortic Valve Leaflets  DISPOSITION OF SPECIMEN:  PATHOLOGY  COUNTS:  YES  TOURNIQUET:  * No tourniquets in log *  DICTATION: .Dragon Dictation  PLAN OF CARE: Admit to inpatient   PATIENT DISPOSITION:  ICU - intubated and hemodynamically stable.   Delay start of Pharmacological VTE agent (>24hrs) due to surgical blood loss or risk of bleeding: yes

## 2023-11-02 NOTE — Progress Notes (Signed)
Called short stay to obtain G2 drink and spoke with Marcelino Duster. She stated for me to disregard the order for the G2 drink. Clarified that patient is supposed to take valium and metoprolol prior to leaving floor for surgery and she said yes-administered meds. Patient has had consents signed, 2nd CHG bath, presurgical mouth rinse, meds, foam dressing placement, all labs and imaging. Called radiology to obtain chest xray results and tech stated that she would contact radiologist for read. Got patient up to toilet for a possible bowel movement and he began to have a spontaneous nose bleed that was resolved by applying pressure (pinching) to nostrils. Irrigated patient's catheter with 120 mls of normal saline this morning-no clots. Patient voices that he had a restful sleep with restoril. Denied additional needs.

## 2023-11-02 NOTE — Anesthesia Procedure Notes (Signed)
Central Venous Catheter Insertion Performed by: Achille Rich, MD, anesthesiologist Start/End11/14/2024 7:23 AM, 11/02/2023 7:25 AM Patient location: Pre-op. Preanesthetic checklist: patient identified, IV checked, site marked, risks and benefits discussed, surgical consent, monitors and equipment checked, pre-op evaluation, timeout performed and anesthesia consent Hand hygiene performed  and maximum sterile barriers used  PA cath was placed.Swan type:thermodilution Procedure performed without using ultrasound guided technique. Attempts: 1 Patient tolerated the procedure well with no immediate complications.

## 2023-11-02 NOTE — Transfer of Care (Signed)
Immediate Anesthesia Transfer of Care Note  Patient: Kyle Matthews  Procedure(s) Performed: AORTIC VALVE REPLACEMENT (AVR) USING 23 MM INSPIRIS RESILIA AORTIC VALVE (Chest) CORONARY ARTERY BYPASS GRAFTING (CABG) X TWO USING LEFT INTERNAL MAMMARY ARTERY AND RIGHT GREATER SAPHENEOUS VEIN HARVESTED ENDOSCOPICLY (Chest) TRANSESOPHAGEAL ECHOCARDIOGRAM  Patient Location: ICU  Anesthesia Type:General  Level of Consciousness: Patient remains intubated per anesthesia plan  Airway & Oxygen Therapy: Patient remains intubated per anesthesia plan and Patient placed on Ventilator (see vital sign flow sheet for setting)  Post-op Assessment: Report given to RN and Post -op Vital signs reviewed and stable  Post vital signs: Reviewed and stable  Last Vitals:  Vitals Value Taken Time  BP 90/62 11/02/23 1400  Temp    Pulse 80 11/02/23 1401  Resp 16 11/02/23 1401  SpO2 92 % 11/02/23 1401  Vitals shown include unfiled device data.  Last Pain:  Vitals:   11/02/23 0650  TempSrc:   PainSc: 0-No pain      Patients Stated Pain Goal: 0 (10/24/23 0830)  Complications: No notable events documented.

## 2023-11-02 NOTE — Progress Notes (Signed)
EVENING ROUNDS NOTE :     [address]       [name] [address]             [phone]                 Day of Surgery Procedure(s) (LRB): AORTIC VALVE REPLACEMENT (AVR) USING 23 MM INSPIRIS RESILIA AORTIC VALVE (N/A) CORONARY ARTERY BYPASS GRAFTING (CABG) X TWO USING LEFT INTERNAL MAMMARY ARTERY AND RIGHT GREATER SAPHENEOUS VEIN HARVESTED ENDOSCOPICLY (N/A) TRANSESOPHAGEAL ECHOCARDIOGRAM (N/A)   Total Length of Stay:  LOS: 10 days  Events:   No events Good HD     BP 99/72   Pulse 80   Temp 99.3 F (37.4 C)   Resp 17   Ht 5\' 3"  (1.6 m)   Wt 65 kg   SpO2 98%   BMI 25.38 kg/m   PAP: (25-38)/(13-22) 25/14 CVP:  [5 mmHg-16 mmHg] 7 mmHg CO:  [3.7 L/min-4.7 L/min] 3.9 L/min CI:  [2.2 L/min/m2-2.8 L/min/m2] 2.3 L/min/m2  Vent Mode: SIMV;PRVC;PSV FiO2 (%):  [50 %] 50 % Set Rate:  [16 bmp-20 bmp] 20 bmp Vt Set:  [450 mL] 450 mL PEEP:  [5 cmH20] 5 cmH20 Plateau Pressure:  [14 cmH20-17 cmH20] 14 cmH20   sodium chloride 10 mL/hr at 11/02/23 1700   [START ON 11/03/2023] sodium chloride     sodium chloride 10 mL/hr at 11/02/23 1700   albumin human      ceFAZolin (ANCEF) IV Stopped (11/02/23 1453)   dexmedetomidine (PRECEDEX) IV infusion 0.2 mcg/kg/hr (11/02/23 1700)   insulin 1 mL/hr at 11/02/23 1700   lactated ringers     lactated ringers 20 mL/hr at 11/02/23 1700   magnesium sulfate 20 mL/hr at 11/02/23 1700   phenylephrine (NEO-SYNEPHRINE) Adult infusion 50 mcg/min (11/02/23 1700)   sodium chloride irrigation     vancomycin      I/O last 3 completed shifts: In: 1960 [P.O.:720; Other:1240] Out: 2315 [Urine:2315]      Latest Ref Rng & Units 11/02/2023    4:00 PM 11/02/2023    2:09 PM 11/02/2023    2:06 PM  CBC  WBC 4.0 - 10.5 K/uL 20.0   19.5   Hemoglobin 13.0 - 17.0 g/dL 91.4 - 78.2 g/dL 8.1    7.8  7.5  7.5   Hematocrit 39.0 - 52.0 % 39.0 - 52.0 % 25.7    23.0  22.0  23.9   Platelets 150 - 400 K/uL 154   142        Latest Ref  Rng & Units 11/02/2023    4:00 PM 11/02/2023    2:09 PM 11/02/2023   12:47 PM  BMP  Glucose 70 - 99 mg/dL   956   BUN 8 - 23 mg/dL   15   Creatinine 2.13 - 1.24 mg/dL   0.86   Sodium 578 - 469 mmol/L 139  141  140   Potassium 3.5 - 5.1 mmol/L 4.0  4.2  3.9   Chloride 98 - 111 mmol/L   103     ABG    Component Value Date/Time   PHART 7.299 (L) 11/02/2023 1600   PCO2ART 51.0 (H) 11/02/2023 1600   PO2ART 83 11/02/2023 1600   HCO3 25.1 11/02/2023 1600   TCO2 27 11/02/2023 1600   ACIDBASEDEF 2.0 11/02/2023 1600   O2SAT 95 11/02/2023 1600       Brynda Greathouse, MD 11/02/2023 5:39 PM

## 2023-11-02 NOTE — Interval H&P Note (Signed)
History and Physical Interval Note:  11/02/2023 6:35 AM  Kyle Matthews  has presented today for surgery, with the diagnosis of SEVERE AS AI CAD.  The various methods of treatment have been discussed with the patient and family. After consideration of risks, benefits and other options for treatment, the patient has consented to  Procedure(s): AORTIC VALVE REPLACEMENT (AVR) (N/A) CORONARY ARTERY BYPASS GRAFTING (CABG) (N/A) TRANSESOPHAGEAL ECHOCARDIOGRAM (N/A) as a surgical intervention.  The patient's history has been reviewed, patient examined, no change in status, stable for surgery.  I have reviewed the patient's chart and labs.  Questions were answered to the patient's satisfaction.     Alleen Borne

## 2023-11-02 NOTE — Op Note (Signed)
CARDIOVASCULAR SURGERY OPERATIVE NOTE  11/02/2023  Surgeon:  Alleen Borne, MD  First Assistant: Lowella Dandy,  PA-C:   An experienced assistant was required given the complexity of this surgery and the standard of surgical care. The assistant was needed for endoscopic vein harvest, exposure, dissection, suctioning, retraction of delicate tissues and sutures, instrument exchange and for overall help during this procedure.   Preoperative Diagnosis:  Left main coronary artery disease, Severe aortic stenosis and aortic insufficiency.   Postoperative Diagnosis:  Same   Procedure:  Median Sternotomy Extracorporeal circulation 3.   Coronary artery bypass grafting x 2  Left internal mammary artery graft to the LAD SVG to Ramus   4.   Endoscopic vein harvest from the right leg 5.   Aortic valve replacement using a 23 mm Edwards INSPIRIS RESILIA pericardial valve.   Anesthesia:  General Endotracheal   Clinical History/Surgical Indication:  Kyle Matthews is a 77 y.o. male with symptoms of severe, stage D1 aortic stenosis with moderate to severe aortic regurgitation with NYHA Class II symptoms. He was admitted last month for acute anemia related to hematuria (hemolysis labs negative), NSTEMI with left main disease, multifocal PNA and HFpEF. He has had hematuria due to prostate enlargement with urinary retention and needs to have a prostatectomy by urology after his cardiac disease is treated.    Echo 10/03/23 showed EF 55-60%, mild to mod MR, and severe AS with mean gradient 42 mm hg, peak gradient 69.2 mm hg, AVA 0.89 cm2 as well as moderate to severe AI.    L/RHC 10/09/23 showed 50% Ost LM lesion with an RFR of 0.83 and moderate diffuse disease of the right coronary artery.   Cardiac gated CTA of the heart revealed anatomical characteristics consistent with aortic stenosis suitable for treatment  by transcatheter aortic valve replacement without any significant complicating features and CTA of the aorta and iliac vessels demonstrated what appear to be adequate pelvic vascular access to facilitate a transfemoral approach. CT scans did note pulmonary edema as well as incidental findings in the kidney/pancreas that will require follow up MRI. His case was discussed by the multidisciplinary heart valve team and it was felt that TAVR and LM PCI would be a reasonable alternative. Unfortunately he failed a trial of ASA and Plavix with rapid development of significant hematuria. Therefore it was felt that open surgery would be the best option to avoid the need for DAPT.   I discussed the operative procedure with the patient and his wife including alternatives, benefits and risks; including but not limited to bleeding, blood transfusion, infection, stroke, myocardial infarction, graft failure, heart block requiring a permanent pacemaker, organ dysfunction, and death.  Marisue Ivan understands and agrees to proceed.    Preparation:  The patient was seen in the preoperative holding area and the correct patient, correct operation were confirmed with the patient after reviewing the medical record and catheterization. The consent was signed by me. Preoperative antibiotics were given. A pulmonary arterial line and radial arterial line were placed by the anesthesia team. The patient was taken back to the operating room and positioned supine on the operating room table. After being placed under general endotracheal anesthesia by the anesthesia team a foley catheter was placed. The neck, chest, abdomen, and both legs were prepped with betadine soap and solution and draped in the usual sterile manner. A surgical time-out was taken and the correct patient and operative procedure were confirmed with the nursing and anesthesia staff.   Cardiopulmonary  Bypass:  A median sternotomy was performed. The pericardium was opened  in the midline. Right ventricular function appeared normal. The ascending aorta was of normal size and had no palpable plaque. There were no contraindications to aortic cannulation or cross-clamping. The patient was fully systemically heparinized and the ACT was maintained > 400 sec. The proximal aortic arch was cannulated with a 20 F aortic cannula for arterial inflow. Venous cannulation was performed via the right atrial appendage using a two-staged venous cannula. An antegrade cardioplegia/vent cannula was inserted into the mid-ascending aorta. A left ventricular vent was placed via the right superior pulmonary vein. A retrograde cardioplegia cannula was placed via the right atrium in to the coronary sinus. Aortic occlusion was performed with a single cross-clamp. Systemic cooling to 32 degrees Centigrade and topical cooling of the heart with iced saline were used. Cold retrograde KBC cardioplegia was used to induce diastolic arrest and was then given at about 60 minute intervals throughout the period of arrest.  A temperature probe was inserted into the interventricular septum and an insulating pad was placed in the pericardium.CO2 was insufflated into the pericardium.    Left internal mammary artery harvest:  The left side of the sternum was retracted using the Rultract retractor. The left internal mammary artery was harvested as a pedicle graft. All side branches were clipped. It was a medium-sized vessel of good quality with excellent blood flow. It was ligated distally and divided. It was sprayed with topical papaverine solution to prevent vasospasm.   Endoscopic vein harvest: performed by Lowella Dandy, PA-C  The right greater saphenous vein was harvested endoscopically through a 2 cm incision medial to the right knee. It was harvested from the thigh. It was a medium-sized vein of good quality. The side branches were all ligated with 4-0 silk ties.    Coronary arteries:  The coronary arteries  were examined.  LAD:  large vessel with no distal disease LCX:  large Ramus that was intramyocardial and located in the mid portion.  RCA:  diffusely diseased but only moderate stenosis on cath.   Grafts: Performed by me and assisted by Lowella Dandy, PA-C  LIMA to the LAD: 3.0 mm. It was sewn end to side using 8-0 prolene continuous suture. SVG to Ramus:  2.5 mm. It was sewn end to side using 7-0 prolene continuous suture. .  The proximal vein graft anastomosis was performed to the mid-ascending aorta using continuous 6-0 prolene suture. A graft marker was placed around the proximal anastomosis.  Aortic Valve Replacement:  Performed by me and assisted by Lowella Dandy, PA-C   A transverse aortotomy was performed 2 cm above the take-off of the right coronary artery. The native valve was tricuspid with severely calcified leaflets and severe annular calcification. There was a ring of exophytic calcium in the aortic root at the STJ level along the right and non-coronary sinuses. The ostia of the coronary arteries were in normal position and the left main was noticed to be narrowed. The native valve leaflets were excised and the annulus was decalcified with rongeurs. This took a considerable amount of time. Care was taken to remove all particulate debris. The left ventricle was directly inspected for debris and then irrigated with ice saline solution. The annulus was sized and a size 23 mm Edwards INSPIRIS RESILIA pericardial valve was chosen. The model number was [id number] and the serial number was [id number]. 2-0 Ethibond pledgeted horizontal mattress sutures were placed around the annulus with the pledgets in  a sub-annular position. The sutures were placed through the sewing ring and the valve lowered into place. The sutures were tied using CorKnots. The valve seated nicely and the coronary ostia were not obstructed. The prosthetic valve leaflets moved normally and there was no sub-valvular obstruction.  The aortotomy was closed using 4-0 Prolene suture in 2 layers with felt strips to reinforce the closure.   Completion:  The patient was rewarmed to 37 degrees Centigrade. A dose of warm retrograde reanimation cardioplegia was given.  The clamp was removed from the LIMA pedicle and there was rapid warming of the septum and return of junctional rhythm. The crossclamp was removed with a time of 144 minutes. The distal and proximal anastomoses were checked for hemostasis. The position of the grafts was satisfactory. Two temporary epicardial pacing wires were placed on the right atrium and two on the right ventricle. The patient was weaned from CPB without difficulty on no inotropes. CPB time was 168 minutes. Cardiac output was 5 LPM. TEE showed a normally functioning aortic valve prosthesis with no AI or paravalvular leak. Heparin was fully reversed with protamine and the aortic and venous cannulas removed. Hemostasis was achieved. Mediastinal and left pleural drainage tubes were placed. The sternum was closed with  #6 stainless steel wires. The fascia was closed with continuous # 1 vicryl suture. The subcutaneous tissue was closed with 2-0 vicryl continuous suture. The skin was closed with 3-0 vicryl subcuticular suture. All sponge, needle, and instrument counts were reported correct at the end of the case. Dry sterile dressings were placed over the incisions and around the chest tubes which were connected to pleurevac suction. The patient was then transported to the surgical intensive care unit in stable condition.

## 2023-11-03 ENCOUNTER — Inpatient Hospital Stay (HOSPITAL_COMMUNITY): Payer: Medicare Other

## 2023-11-03 ENCOUNTER — Encounter (HOSPITAL_COMMUNITY): Payer: Self-pay | Admitting: Surgery

## 2023-11-03 ENCOUNTER — Other Ambulatory Visit: Payer: Self-pay | Admitting: Cardiology

## 2023-11-03 DIAGNOSIS — Z952 Presence of prosthetic heart valve: Secondary | ICD-10-CM

## 2023-11-03 LAB — BASIC METABOLIC PANEL
Anion gap: 9 (ref 5–15)
Anion gap: 9 (ref 5–15)
BUN: 18 mg/dL (ref 8–23)
BUN: 24 mg/dL — ABNORMAL HIGH (ref 8–23)
CO2: 21 mmol/L — ABNORMAL LOW (ref 22–32)
CO2: 22 mmol/L (ref 22–32)
Calcium: 7.6 mg/dL — ABNORMAL LOW (ref 8.9–10.3)
Calcium: 7.8 mg/dL — ABNORMAL LOW (ref 8.9–10.3)
Chloride: 105 mmol/L (ref 98–111)
Chloride: 99 mmol/L (ref 98–111)
Creatinine, Ser: 1.13 mg/dL (ref 0.61–1.24)
Creatinine, Ser: 2.04 mg/dL — ABNORMAL HIGH (ref 0.61–1.24)
GFR, Estimated: >60 mL/min (ref >60)
GFR, Estimated: 33 mL/min — ABNORMAL LOW (ref >60)
Glucose, Bld: 111 mg/dL — ABNORMAL HIGH (ref 70–99)
Glucose, Bld: 133 mg/dL — ABNORMAL HIGH (ref 70–99)
Potassium: 4.4 mmol/L (ref 3.5–5.1)
Potassium: 4.3 mmol/L (ref 3.5–5.1)
Sodium: 135 mmol/L (ref 135–145)
Sodium: 130 mmol/L — ABNORMAL LOW (ref 135–145)

## 2023-11-03 LAB — GLUCOSE, CAPILLARY
Glucose-Capillary: 115 mg/dL — ABNORMAL HIGH (ref 70–99)
Glucose-Capillary: 116 mg/dL — ABNORMAL HIGH (ref 70–99)
Glucose-Capillary: 117 mg/dL — ABNORMAL HIGH (ref 70–99)
Glucose-Capillary: 124 mg/dL — ABNORMAL HIGH (ref 70–99)
Glucose-Capillary: 129 mg/dL — ABNORMAL HIGH (ref 70–99)
Glucose-Capillary: 147 mg/dL — ABNORMAL HIGH (ref 70–99)

## 2023-11-03 LAB — CBC
HCT: 24.8 % — ABNORMAL LOW (ref 39.0–52.0)
Hemoglobin: 7.6 g/dL — ABNORMAL LOW (ref 13.0–17.0)
MCH: 26.9 pg (ref 26.0–34.0)
MCHC: 30.6 g/dL (ref 30.0–36.0)
MCV: 87.6 fL (ref 80.0–100.0)
Platelets: 152 10*3/uL (ref 150–400)
RBC: 2.83 MIL/uL — ABNORMAL LOW (ref 4.22–5.81)
RDW: 16.6 % — ABNORMAL HIGH (ref 11.5–15.5)
WBC: 15.6 10*3/uL — ABNORMAL HIGH (ref 4.0–10.5)
nRBC: 0 % (ref 0.0–0.2)

## 2023-11-03 LAB — SURGICAL PATHOLOGY

## 2023-11-03 LAB — MAGNESIUM
Magnesium: 3.1 mg/dL — ABNORMAL HIGH (ref 1.7–2.4)
Magnesium: 3.1 mg/dL — ABNORMAL HIGH (ref 1.7–2.4)

## 2023-11-03 MED ORDER — FUROSEMIDE 10 MG/ML IJ SOLN
40.0000 mg | Freq: Two times a day (BID) | INTRAMUSCULAR | Status: AC
  Administered 2023-11-03 (×2): 40 mg via INTRAVENOUS
  Filled 2023-11-03 (×2): qty 4

## 2023-11-03 MED ORDER — ASPIRIN 81 MG PO TBEC
81.0000 mg | DELAYED_RELEASE_TABLET | Freq: Every day | ORAL | Status: DC
  Administered 2023-11-03 – 2023-11-08 (×6): 81 mg via ORAL
  Filled 2023-11-03 (×6): qty 1

## 2023-11-03 MED ORDER — FE FUM-VIT C-VIT B12-FA 460-60-0.01-1 MG PO CAPS
1.0000 | ORAL_CAPSULE | Freq: Every day | ORAL | Status: DC
  Administered 2023-11-03 – 2023-11-08 (×6): 1 via ORAL
  Filled 2023-11-03 (×6): qty 1

## 2023-11-03 MED ORDER — INSULIN ASPART 100 UNIT/ML IJ SOLN
0.0000 [IU] | Freq: Three times a day (TID) | INTRAMUSCULAR | Status: DC
  Administered 2023-11-03 (×2): 2 [IU] via SUBCUTANEOUS

## 2023-11-03 MED ORDER — TRAMADOL HCL 50 MG PO TABS
50.0000 mg | ORAL_TABLET | ORAL | Status: DC | PRN
  Filled 2023-11-03: qty 1

## 2023-11-03 MED FILL — Thrombin (Recombinant) For Soln 20000 Unit: CUTANEOUS | Qty: 1 | Status: AC

## 2023-11-03 NOTE — Progress Notes (Signed)
1 Day Post-Op Subjective: Saw patient in CVICU after he had undergone his open heart surgery.  He was alert, oriented, and in his usual good spirits.  Thankfully after being heparinized there is still no evidence of hematuria  Objective: Vital signs in last 24 hours: Temp:  [96.1 F (35.6 C)-100 F (37.8 C)] 97.9 F (36.6 C) (11/15 0800) Pulse Rate:  [72-89] 73 (11/15 1000) Resp:  [11-23] 17 (11/15 1000) BP: (86-121)/(56-72) 112/64 (11/15 1000) SpO2:  [89 %-100 %] 97 % (11/15 1000) Arterial Line BP: (65-140)/(40-108) 115/45 (11/15 1000) FiO2 (%):  [40 %-50 %] 40 % (11/14 1739) Weight:  [69.7 kg] 69.7 kg (11/15 0500)  Assessment/Plan: #ABLA #Hematuria  # BPH   Loading dose of Firmagon provided 11/4.  Will continue ADT through to prostate surgery in an effort to reduce prostate size, vascularity, and bleeding.     Foley catheter for three-way 43f hematuria coud.  CBI off.   PAE performed on 10/27/2023.  Unfortunately only left prostatic artery could be embolized.  Thankfully, it appeared to be the source for the bleeding in question.  Remains free of hematuria    120g prostate with large median lobe.  Bladder outlet obstruction occurred back in August, prior to any instance of hematuria.  The prostatic artery embolization and Deborra Medina will eventually have an effect on the prostate tissue and may reduce it's size somewhat.  However considering the very large size of his prostate I doubt it will be enough for normal urination without surgical intervention.  Foley should stay in place through discharge.  Will need to exchange every 30 days.  Will still plan to proceed with his simple prostatectomy once patient is appropriate from cardiac perspective.  #Bladder Spasm Spasm still occasionally present.  Improved since upping hyoscyamine dose.  Continue as needed  Urology will sign off at this time.  Please call with questions or concerns.  Otherwise we will see him on an outpatient  basis.  Intake/Output from previous day: 11/14 0701 - 11/15 0700 In: 5158.9 [P.O.:160; I.V.:2531.6; Blood:220; IV Piggyback:2247.3] Out: 2984 [Urine:1135; Blood:1289; Chest Tube:560]  Intake/Output this shift: Total I/O In: 206.4 [I.V.:106.4; IV Piggyback:100] Out: 80 [Urine:30; Chest Tube:50]  Physical Exam:  General: Alert and oriented CV: No cyanosis Lungs: equal chest rise Gu: Foley catheter in place draining clear yellow urine.  Lab Results: Recent Labs    11/02/23 1859 11/02/23 2000 11/03/23 0308  HGB 8.2* 8.0* 7.6*  HCT 24.0* 25.4* 24.8*   BMET Recent Labs    11/02/23 2000 11/03/23 0308  NA 136 135  K 4.0 4.4  CL 106 105  CO2 22 21*  GLUCOSE 137* 111*  BUN 17 18  CREATININE 0.76 1.13  CALCIUM 7.3* 7.6*     Studies/Results: DG Chest Port 1 View  Result Date: 11/03/2023 CLINICAL DATA:  [id number] S/P CABG x 2 [id number] S/P AVR [id number] EXAM: PORTABLE CHEST 1 VIEW COMPARISON:  Chest x-ray 11/02/2023 FINDINGS: Right internal jugular Swan-Ganz catheter noted with tip overlying the expected region of the right main pulmonary artery. At least 1 mediastinal drain noted. Likely lower lying left chest tube noted. No definite right chest tube. Pacing epicardial wires partially visualized. Interval removal of an endotracheal tube and enteric tube. Interval development of lucency overlying the left heart/mediastinum. Otherwise the heart and mediastinal contours are unchanged. Atherosclerotic plaque. Aortic valve replacement. Prominent hilar vasculature. No focal consolidation. Interval worsening of coarsened and increased interstitial markings. At least small volume left pleural effusion. Trace  right pleural effusion. No pneumothorax. No acute osseous abnormality. IMPRESSION: 1. Interval development of lucency overlying the left heart/mediastinum of unclear etiology. Finding may be artifactual. Recommend attention on follow-up. 2.  At least small volume left pleural effusion.  3. Trace right pleural effusion. 4. Developing mild pulmonary edema. 5. Lines and tubes as above with interval removal of an endotracheal tube and enteric tube. Electronically Signed   By: Tish Frederickson M.D.   On: 11/03/2023 10:26   DG Chest Port 1 View  Result Date: 11/02/2023 CLINICAL DATA:  Status post aortic valve replacement. EXAM: PORTABLE CHEST 1 VIEW COMPARISON:  11/02/2023 FINDINGS: Interval post CABG changes with a prosthetic aortic valve. Endotracheal tube tip 2.7 cm above the carina. Right jugular Swan-Ganz catheter tip in the distal right main pulmonary artery. Nasogastric tube extending into the stomach. Mediastinal and left basilar chest tubes. Stable enlarged cardiac silhouette and prominent pulmonary vasculature and interstitial markings without Kerley lines. Small calcified granuloma in the right mid lung zone. Small left pleural effusion. No pneumothorax or pneumomediastinum seen. IMPRESSION: 1. Interval post CABG changes with satisfactory positioning of the support apparatus. 2. Stable cardiomegaly, mild pulmonary venous congestion and chronic interstitial lung disease. 3. Small left pleural effusion. Electronically Signed   By: Beckie Salts M.D.   On: 11/02/2023 17:33   EP STUDY  Result Date: 11/02/2023 See surgical note for result.  DG CHEST PORT 1 VIEW  Result Date: 11/02/2023 CLINICAL DATA:  Encounter for preop testing EXAM: PORTABLE CHEST 1 VIEW COMPARISON:  10/24/2023 FINDINGS: Chronic cardiac enlargement. Congested appearance of vessels with streaky retrocardiac density. Small left pleural effusion. No pneumothorax. Stable aortic and hilar contours. Extensive atheromatous calcification of the aorta. IMPRESSION: 1. Cardiomegaly and mild vascular congestion. 2. Retrocardiac opacity likely from pleural fluid and atelectasis. Electronically Signed   By: Tiburcio Pea M.D.   On: 11/02/2023 05:22      LOS: 11 days   Elmon Kirschner, NP [facility] Pager: [phone]  11/03/2023, 11:23 AM

## 2023-11-03 NOTE — Discharge Instructions (Signed)
Discharge Instructions:  1. You may shower, please wash incisions daily with soap and water and keep dry.  If you wish to cover wounds with dressing you may do so but please keep clean and change daily.  No tub baths or swimming until incisions have completely healed.  If your incisions become red or develop any drainage please call our office at [phone]  2. No Driving until cleared by Dr. Bartle's office and you are no longer using narcotic pain medications  3. Monitor your weight daily.. Please use the same scale and weigh at same time... If you gain 5-10 lbs in 48 hours with associated lower extremity swelling, please contact our office at [phone]  4. Fever of 101.5 for at least 24 hours with no source, please contact our office at [phone]  5. Activity- up as tolerated, please walk at least 3 times per day.  Avoid strenuous activity, no lifting, pushing, or pulling with your arms over 8-10 lbs for a minimum of 6 weeks  6. If any questions or concerns arise, please do not hesitate to contact our office at [phone]  

## 2023-11-03 NOTE — Plan of Care (Signed)

## 2023-11-03 NOTE — Discharge Summary (Signed)
[address]       [place] [address]             [phone]    Physician Discharge Summary  Patient ID: Kyle Matthews MRN: [id number] DOB/AGE: 75/04/1946 75 y.o.  Admit date: 10/23/2023 Discharge date: 11/08/2023  Admission Diagnoses:  Patient Active Problem List   Diagnosis Date Noted   Chronic heart failure with preserved ejection fraction (HFpEF) (HCC) 11/01/2023   Acute blood loss anemia 10/30/2023   Benign hypertension 10/30/2023   Acute heart failure with preserved ejection fraction (HFpEF) (HCC) 10/28/2023   Atherosclerosis of native coronary artery of native heart without angina pectoris 10/28/2023   Pressure injury of skin 10/28/2023   Chest pain 10/23/2023   History of essential hypertension 10/23/2023   GERD (gastroesophageal reflux disease) 10/23/2023   NSTEMI (non-ST elevated myocardial infarction) (HCC) 10/11/2023   Aortic valve disease 10/11/2023   Coronary artery disease involving native coronary artery of native heart with unstable angina pectoris (HCC) 10/10/2023   Chronic blood loss anemia 10/10/2023   Gross hematuria 10/07/2023   BPH with urinary obstruction 10/04/2023   Acute on chronic diastolic CHF (congestive heart failure) (HCC) 10/04/2023   Symptomatic anemia 10/03/2023   Kidney lesion, native, left 10/03/2023   Acute respiratory failure with hypoxia (HCC) 10/03/2023   Lesion of right native kidney 10/03/2023   Elevated brain natriuretic peptide (BNP) level 10/03/2023   Coronary artery disease 10/03/2023   SOB (shortness of breath) 10/03/2023   Bladder outlet obstruction 10/03/2023   Chronic indwelling Foley catheter 10/03/2023   Hematuria 10/03/2023   Enlarged prostate 10/03/2023   Moderate aortic regurgitation 10/03/2023   Hyperlipidemia 05/20/2017   Nicotine dependence, cigarettes, uncomplicated 05/20/2017   Carotid bruit 05/19/2017   Essential hypertension    RBBB 04/13/2017   Severe aortic stenosis 04/13/2017    Discharge Diagnoses:  Patient Active Problem List   Diagnosis Date Noted   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   S/P AVR (aortic valve replacement) 11/02/2023   Chronic heart failure with preserved ejection fraction (HFpEF) (HCC) 11/01/2023   Acute blood loss anemia 10/30/2023   Benign hypertension 10/30/2023   Acute heart failure with preserved ejection fraction (HFpEF) (HCC) 10/28/2023   Atherosclerosis of native coronary artery of native heart without angina pectoris 10/28/2023   Pressure injury of skin 10/28/2023   Chest pain 10/23/2023   History of essential hypertension 10/23/2023   GERD (gastroesophageal reflux disease) 10/23/2023   NSTEMI (non-ST elevated myocardial infarction) (HCC) 10/11/2023   Aortic valve disease 10/11/2023   Coronary artery disease involving native coronary artery of native heart with unstable angina pectoris (HCC) 10/10/2023   Chronic blood loss anemia 10/10/2023   Gross hematuria 10/07/2023   BPH with urinary obstruction 10/04/2023   Acute on chronic diastolic CHF (congestive heart failure) (HCC) 10/04/2023   Symptomatic anemia 10/03/2023   Kidney lesion, native, left 10/03/2023   Acute respiratory failure with hypoxia (HCC) 10/03/2023   Lesion of right native kidney 10/03/2023   Elevated brain natriuretic peptide (BNP) level 10/03/2023   Coronary artery disease 10/03/2023   SOB (shortness of breath) 10/03/2023   Bladder outlet obstruction 10/03/2023   Chronic indwelling Foley catheter 10/03/2023   Hematuria 10/03/2023   Enlarged prostate 10/03/2023   Moderate aortic regurgitation 10/03/2023   Hyperlipidemia 05/20/2017   Nicotine dependence, cigarettes, uncomplicated 05/20/2017   Carotid bruit 05/19/2017   Essential hypertension    RBBB 04/13/2017   Severe aortic stenosis 04/13/2017   Discharged  Condition: good  History of Present Illness:  Kyle Matthews is a 77 y.o. 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 Lynnette Caffey. Kyle Matthews has been known to have aortic valve disease since 2018. Echo at [facility] on 10/10/22 showed EF 65-70%, mild MR, severe AS with a mean gradient of 43 mm hg and moderate AI. There was a question of a TIA at the time with transient leg weakness. CTA head and neck was unremarkable. Brain MRI showed chronic lacunar infarcts chronic microvascular ischemic disease.    He was seen in the office by Carolan Clines MD on 04/28/23 and noted to have transient chest pain and nausea but overall felt to be asymptomatic and continued surveillance of aortic valve was recommended.    He has issues with BPH with chronic bladder outlet obstruction and urinary retention requiring an indwelling foley. He had AKI with a creatinine of 8.5 in August of this year. CT abdomen on 08/17/23 showed severe hydroureteronephrosis bilaterally with no obstructing stone. Prostate gland was enlarged in the urinary bladder is markedly distended suggesting bladder outlet obstruction. Renal function normalized after foley was placed; however, he has had ongoing issues with hematuria with the foley. He presented to [facility] on 09/23/2023 with bloody drainage. Tubing was replaced and he was treated with Keflex. He is awaiting robotic assisted prostatectomy on 10/25 with Dr. Berneice Heinrich (now cancelled).   He re-presented to [facility] on 10/03/23 with chest pain, shortness of breath and LE edema x3 weeks. He was noted to be hypoxic with 02 sats 82% on RA as well as acute anemia and PNA. Pertinent labs: Hg 6.5, BNP 547, hsTrop peak 4814. CTA of the chest was negative for pulmonary embolism but possible multifocal pneumonia. He was treated with IV abx. Heparin held given acute anemia and transfused 3U PRBCs and  iron with improvement of Hg to 10.6. Anemia felt to be 2/2 hematuria and not hemolysis (LDH negative). He was transferred to [facility] for further work up and evaluation. Repeat echo showed EF 55-60%, mild to mod MR, and severe AS with mean gradient 42 mm hg, peak gradient 69.2 mm hg, AVA 0.89 cm2 as well as moderate to severe AI. He underwent [facility] 10/08/21 which showed 50% Ost LM lesion with an RFR of 0.83 and moderate diffuse disease of the right coronary artery. Cardiac gated CTA of the heart revealed anatomical characteristics consistent with aortic stenosis suitable for treatment by transcatheter aortic valve replacement without any significant complicating features and CTA of the aorta and iliac vessels demonstrated what appear to be adequate pelvic vascular access to facilitate a transfemoral approach.     The structural heart team was consulted for consideration of TAVR/PCI vs SAVR/CABG. He was seen by Dr. Lynnette Caffey who felt that TAVR/PCI of the left main would obligate dual antiplatelet therapy for a minimum of 3 months and increase risk of further hematuria, need for blood transfusions, and extend the duration until when he could obtain his prostatectomy.  The alternative is surgical aortic valve replacement with CABG, which he favored given above issues. Surgical consultation was requested as a part of a multidisciplinary approach to his care.  He was evaluated by Dr. Laneta Simmers who felt patient would benefit from  open surgical intervention in setting of CAD with AS.  The risks and benefits of the procedure were explained to the patient and he was agreeable to proceed.  Hospital Course:  Mr. Markell presented to the [facility] ED on 11/4 with complaints of chest pain.  EKG obtained showed diffuse ST depression and elevation in aVR.  It was felt he should be admitted the hospital.  Workup also revealed a hemoglobin level of 5.4 due to gross hematuria from significantly enlarged prostate.  He was transfused and  urology consult was obtained which they recommended Firmagon to help with bleeding, however he ultimately ended up requiring prostate artery embolization.  He will require foley catheter throughout his hospital stay and this should stay in place until discharge.  He has remained stable without chest pain during hospitalization.  He was stable and taken to the operating room on 11/02/2023.  He underwent CABG x 2 utilizing LIMA to LAD and SVG to Ramus intermediate.  He underwent Aortic Valve Replacement with a 23 mm Edwards Resilia Bioprosthetic Valve.  Finally he underwent endoscopic harvest of greater saphenous vine from his right thigh.  He tolerated the procedure without difficulty and was taken to the SICU in stable condition.  He was extubated without difficulty.  He was in NSR and started on Lopressor.  He was started on lasix to facilitate diuresis.  The patient has an enlarged prostate with chronic hematuria.  He required foley placement due to bladder outlet obstruction.  This foley will remain in place and required frequent irrigation.  His chest tubes and arterial lines without difficulty.  He was started on iron for his chronic anemia due to hematuria with exacerbation of recent surgery.  His hemoglobin level dropped to 6.6 felt to be related to red cell attrition.  He was transfused 2 units of packed cells.  His repeat H/H was 8.4.  He developed elevation in his creatinine level likely due to chronic anemia, and this resolved without issue.  He was stable for transfer to the progressive care unit on 11/05/2023.  He developed Atrial Fibrillation with RVR treated with Amiodarone bolus and drip protocol.  He converted to NSR and will be transitioned to oral Amiodarone regimen.  He converted to Sinus Bradycardia.  PT/OT consult was obtained and they recommended no additional home follow up.  His surgical incisions are healing without evidence of infection.  He is medically stable for discharge home  today.  Consults: urology  Significant Diagnostic Studies: angiography:     Ost LM lesion is 50% stenosed.   1.  Moderate left main stenosis with an RFR of 0.83. 2.  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 3.  Fick cardiac output of 6.5 L/min and Fick cardiac index of 3.8 L/min/m with the following hemodynamics:             Right atrial pressure mean of 7             Right ventricular pressure 42/-2 with an end-diastolic pressure of 8 mmHg             Wedge pressure mean of 14 mmHg with V waves to 23 mmHg             PA pressure of 38/16 with a mean of 24 mmHg             PVR of 1.5 Woods units             PA pulsatility index of  3.1   Recommendation: Continue evaluation for aortic valve intervention.  Will obtain TAVR protocol CTA tomorrow.  Echocardiogram:  IMPRESSIONS     1. Left ventricular ejection fraction, by estimation, is 55 to 60%. The  left ventricle has normal function. The left ventricle has no regional  wall motion abnormalities. There is mild left ventricular hypertrophy.  Left ventricular diastolic parameters  are indeterminate. Elevated left atrial pressure.   2. Right ventricular systolic function is normal. The right ventricular  size is normal. There is mildly elevated pulmonary artery systolic  pressure.   3. Left atrial size was moderately dilated.   4. Right atrial size was moderately dilated.   5. The mitral valve is abnormal. Mild to moderate mitral valve  regurgitation. No evidence of mitral stenosis.   6. The tricuspid valve is abnormal.   7. The aortic valve was not well visualized. There is severe calcifcation  of the aortic valve. There is severe thickening of the aortic valve.  Aortic valve regurgitation is moderate to severe. Severe aortic valve  stenosis. Severe aortic stenosis is  present. Aortic valve mean gradient measures 42.0 mmHg. Aortic valve peak  gradient measures 69.2 mmHg. Aortic valve area, by VTI measures 0.89  cm.   8. The inferior vena cava is dilated in size with >50% respiratory  variability, suggesting right atrial pressure of 8 mmHg.   Treatments: surgery:   11/02/2023   Surgeon:  Alleen Borne, MD   First Assistant: Lowella Dandy,  PA-C:   An experienced assistant was required given the complexity of this surgery and the standard of surgical care. The assistant was needed for endoscopic vein harvest, exposure, dissection, suctioning, retraction of delicate tissues and sutures, instrument exchange and for overall help during this procedure.    Preoperative Diagnosis:  Left main coronary artery disease, Severe aortic stenosis and aortic insufficiency.    Postoperative Diagnosis:  Same    Procedure:   Median Sternotomy Extracorporeal circulation 3.   Coronary artery bypass grafting x 2   Left internal mammary artery graft to the LAD SVG to Ramus     4.   Endoscopic vein harvest from the right leg 5.   Aortic valve replacement using a 23 mm Edwards INSPIRIS RESILIA pericardial valve.    Discharge Exam: Blood pressure (!) 160/77, pulse 66, temperature 98.2 F (36.8 C), temperature source Oral, resp. rate 20, height 5\' 3"  (1.6 m), weight 68 kg, SpO2 97%.  General appearance: alert, cooperative, and no distress Heart: regular rate and rhythm Lungs: clear to auscultation bilaterally Abdomen: soft, non-tender; bowel sounds normal; no masses,  no organomegaly Extremities: edema trace Wound: clean and dry  Discharge Medications:  The patient has been discharged on:   1.Beta Blocker:  Yes [   ]                              No   [ X  ]                              If No, reason: Bradycardia  2.Ace Inhibitor/ARB: Yes [   ]                                     No  [ x   ]  If No, reason: AKI  3.Statin:   Yes [ x ]                  No  [   ]                  If No, reason:  4.Ecasa:  Yes  [ x  ]                  No   [   ]                   If No, reason:  Patient had ACS upon admission: No   Plavix/P2Y12 inhibitor: Yes [   ]                                      No  [  X ]   Allergies as of 11/08/2023       Reactions   Tetanus Toxoids    Childhood Allergy         Medication List     STOP taking these medications    clopidogrel 75 MG tablet Commonly known as: Plavix       TAKE these medications    amiodarone 200 MG tablet Commonly known as: PACERONE Take 2 tablets (400 mg total) by mouth 2 (two) times daily. X 5 days, then decrease to 200 mg BID x 7 days, then decrease to 200 mg daily   amLODipine 5 MG tablet Commonly known as: NORVASC Take 1 tablet (5 mg total) by mouth daily.   aspirin EC 81 MG tablet Take 1 tablet (81 mg total) by mouth daily. Swallow whole.   Fe Fum-Vit C-Vit B12-FA Caps capsule Commonly known as: TRIGELS-F FORTE Take 1 capsule by mouth daily.   finasteride 5 MG tablet Commonly known as: PROSCAR Take 5 mg by mouth every evening.   furosemide 20 MG tablet Commonly known as: LASIX Take 1 tablet (20 mg total) by mouth daily as needed.   hyoscyamine 0.125 MG SL tablet Commonly known as: LEVSIN SL Place 2 tablets (0.25 mg total) under the tongue every 4 (four) hours as needed (Bladder spasms).   nitroGLYCERIN 0.4 MG SL tablet Commonly known as: NITROSTAT Place 1 tablet (0.4 mg total) under the tongue every 5 (five) minutes as needed for chest pain.   ondansetron 4 MG disintegrating tablet Commonly known as: ZOFRAN-ODT Take 1 tablet (4 mg total) by mouth every 8 (eight) hours as needed for nausea or vomiting.   oxyCODONE 5 MG immediate release tablet Commonly known as: Oxy IR/ROXICODONE Take 1 tablet (5 mg total) by mouth every 4 (four) hours as needed for severe pain (pain score 7-10).   pantoprazole 40 MG tablet Commonly known as: PROTONIX Take 1 tablet (40 mg total) by mouth daily.   rosuvastatin 40 MG tablet Commonly known as: CRESTOR Take 40 mg by mouth  daily.        Follow-up Information     [facility] Follow up on 11/13/2023.   Specialty: Cardiothoracic Surgery Why: Appointment is at 10:00 for suture removal Contact information: [address] [place], [address] [place] [address] [phone]        [facility] Follow up on 12/05/2023.   Why: Please get CXR at 1:00 prior your appointment at Dr. Sharee Pimple office Contact information: [phone]  [facility] [address] [place] [address]        [facility] Follow up on 12/05/2023.   Specialty: Cardiothoracic Surgery Why: Appointment is at 2:00 Contact information: [address] [place], [address] [place] [address] [phone]        Joylene Grapes, NP Follow up on 11/24/2023.   Specialties: Cardiology, Family Medicine Why: Appointment is at 10:55 Contact information: [address] [place] [address] [phone]         [facility] Follow up.   Specialty: Home Health Services Why: [facility]- services to resume - they will contact you to schedule Contact information: [address] [place] [address] [phone]         [facility] Follow up.   Why: Call office to set up foley exchange for 2 weeks Contact information: [address] [place] [address] [phone]                Signed:  Lowella Dandy, PA-C  11/08/2023, 7:42 AM

## 2023-11-03 NOTE — Progress Notes (Signed)
  6 week echocardiogram per request of CT surgery s/p CABG x2 and AVR.   Perlie Gold, PA-C

## 2023-11-03 NOTE — Progress Notes (Signed)
1 Day Post-Op Procedure(s) (LRB): AORTIC VALVE REPLACEMENT (AVR) USING 23 MM INSPIRIS RESILIA AORTIC VALVE (N/A) CORONARY ARTERY BYPASS GRAFTING (CABG) X TWO USING LEFT INTERNAL MAMMARY ARTERY AND RIGHT GREATER SAPHENEOUS VEIN HARVESTED ENDOSCOPICLY (N/A) TRANSESOPHAGEAL ECHOCARDIOGRAM (N/A) Subjective: No complaints  Objective: Vital signs in last 24 hours: Temp:  [96.1 F (35.6 C)-100 F (37.8 C)] 98.4 F (36.9 C) (11/15 0600) Pulse Rate:  [75-89] 80 (11/15 0600) Cardiac Rhythm: Normal sinus rhythm (11/15 0400) Resp:  [10-23] 23 (11/15 0600) BP: (86-127)/(56-72) 108/56 (11/15 0600) SpO2:  [89 %-100 %] 96 % (11/15 0600) Arterial Line BP: (65-140)/(40-108) 130/54 (11/15 0600) FiO2 (%):  [40 %-50 %] 40 % (11/14 1739) Weight:  [69.7 kg] 69.7 kg (11/15 0500)  Hemodynamic parameters for last 24 hours: PAP: (25-40)/(11-22) 33/18 CVP:  [4 mmHg-16 mmHg] 13 mmHg CO:  [3.7 L/min-5.7 L/min] 5.7 L/min CI:  [2.2 L/min/m2-3.4 L/min/m2] 3.4 L/min/m2  Intake/Output from previous day: 11/14 0701 - 11/15 0700 In: 5158.2 [P.O.:160; I.V.:2530.9; Blood:220; IV Piggyback:2247.3] Out: 2964 [Urine:1115; Blood:1289; Chest Tube:560] Intake/Output this shift: Total I/O In: 916.6 [P.O.:120; I.V.:491.5; IV Piggyback:305.1] Out: 550 [Urine:270; Chest Tube:280]  General appearance: alert and cooperative Neurologic: intact Heart: regular rate and rhythm, S1, S2 normal, no murmur Lungs: clear to auscultation bilaterally Extremities: edema moderate Wound: dressings dry  Lab Results: Recent Labs    11/02/23 2000 11/03/23 0308  WBC 21.7* 15.6*  HGB 8.0* 7.6*  HCT 25.4* 24.8*  PLT 171 152   BMET:  Recent Labs    11/02/23 2000 11/03/23 0308  NA 136 135  K 4.0 4.4  CL 106 105  CO2 22 21*  GLUCOSE 137* 111*  BUN 17 18  CREATININE 0.76 1.13  CALCIUM 7.3* 7.6*    PT/INR:  Recent Labs    11/02/23 1406  LABPROT 19.9*  INR 1.7*   ABG    Component Value Date/Time   PHART 7.326 (L)  11/02/2023 1859   HCO3 22.4 11/02/2023 1859   TCO2 24 11/02/2023 1859   ACIDBASEDEF 3.0 (H) 11/02/2023 1859   O2SAT 97 11/02/2023 1859   CBG (last 3)  Recent Labs    11/02/23 1857 11/02/23 2303 11/03/23 0305  GLUCAP 107* 108* 129*   CXR: mild atelectasis. Chronic interstitial lung disease.  ECG: pending Assessment/Plan: S/P Procedure(s) (LRB): AORTIC VALVE REPLACEMENT (AVR) USING 23 MM INSPIRIS RESILIA AORTIC VALVE (N/A) CORONARY ARTERY BYPASS GRAFTING (CABG) X TWO USING LEFT INTERNAL MAMMARY ARTERY AND RIGHT GREATER SAPHENEOUS VEIN HARVESTED ENDOSCOPICLY (N/A) TRANSESOPHAGEAL ECHOCARDIOGRAM (N/A)  POD 1 Hemodynamically stable in sinus rhythm. Continue Lopressor.  Wt is 10 lbs over preop. Start diuresis.  BPH with chronic hematuria. Keep foley in and planning prostatectomy per urology once he recovers. Will only use ASA 81 mg to minimize chance of hematuria.  DC all chest tubes after dangle.  DC swan and arterial line.  IS, OOB, mobilize.  Expected postop anemia due to preop anemia from hematuria and surgery. Start iron and follow.  Glucose under good control with no hx of DM and normal Hgb A1c preop. Will continue SSI today and probably stop by tomorrow.   LOS: 11 days    Alleen Borne 11/03/2023

## 2023-11-03 NOTE — Progress Notes (Signed)
Patient ID: Kyle Matthews, male   DOB: [birth year], 77 y.o.   MRN: [id number]  TCTS Evening Rounds:  Hemodynamically stable. Sinus rhythm today but developed some 2nd degree AV block tonight. Will hold off on further beta blocker and AV pace overnight.  No significant diuresis today with lasix 40 and creat bumped to 2.0 this pm. UO still 30/hr. Creat normal preop. Will hold off on further diuresis for now and repeat in am.  He feels ok.  BMET    Component Value Date/Time   NA 130 (L) 11/03/2023 1627   K 4.3 11/03/2023 1627   CL 99 11/03/2023 1627   CO2 22 11/03/2023 1627   GLUCOSE 133 (H) 11/03/2023 1627   BUN 24 (H) 11/03/2023 1627   CREATININE 2.04 (H) 11/03/2023 1627   CALCIUM 7.8 (L) 11/03/2023 1627   GFRNONAA 33 (L) 11/03/2023 1627

## 2023-11-04 ENCOUNTER — Inpatient Hospital Stay (HOSPITAL_COMMUNITY): Payer: Medicare Other

## 2023-11-04 LAB — CBC
HCT: 21.2 % — ABNORMAL LOW (ref 39.0–52.0)
HCT: 23.9 % — ABNORMAL LOW (ref 39.0–52.0)
Hemoglobin: 6.6 g/dL — CL (ref 13.0–17.0)
Hemoglobin: 7.8 g/dL — ABNORMAL LOW (ref 13.0–17.0)
MCH: 27.5 pg (ref 26.0–34.0)
MCH: 28.6 pg (ref 26.0–34.0)
MCHC: 31.1 g/dL (ref 30.0–36.0)
MCHC: 32.6 g/dL (ref 30.0–36.0)
MCV: 88.3 fL (ref 80.0–100.0)
MCV: 87.5 fL (ref 80.0–100.0)
Platelets: 166 10*3/uL (ref 150–400)
Platelets: 180 10*3/uL (ref 150–400)
RBC: 2.4 MIL/uL — ABNORMAL LOW (ref 4.22–5.81)
RBC: 2.73 MIL/uL — ABNORMAL LOW (ref 4.22–5.81)
RDW: 16.4 % — ABNORMAL HIGH (ref 11.5–15.5)
RDW: 15.9 % — ABNORMAL HIGH (ref 11.5–15.5)
WBC: 12.7 10*3/uL — ABNORMAL HIGH (ref 4.0–10.5)
WBC: 12 10*3/uL — ABNORMAL HIGH (ref 4.0–10.5)
nRBC: 0 % (ref 0.0–0.2)
nRBC: 0 % (ref 0.0–0.2)

## 2023-11-04 LAB — PREPARE RBC (CROSSMATCH)

## 2023-11-04 LAB — GLUCOSE, CAPILLARY
Glucose-Capillary: 100 mg/dL — ABNORMAL HIGH (ref 70–99)
Glucose-Capillary: 116 mg/dL — ABNORMAL HIGH (ref 70–99)
Glucose-Capillary: 118 mg/dL — ABNORMAL HIGH (ref 70–99)
Glucose-Capillary: 131 mg/dL — ABNORMAL HIGH (ref 70–99)
Glucose-Capillary: 131 mg/dL — ABNORMAL HIGH (ref 70–99)
Glucose-Capillary: 132 mg/dL — ABNORMAL HIGH (ref 70–99)

## 2023-11-04 LAB — BASIC METABOLIC PANEL
Anion gap: 7 (ref 5–15)
BUN: 27 mg/dL — ABNORMAL HIGH (ref 8–23)
CO2: 24 mmol/L (ref 22–32)
Calcium: 7.8 mg/dL — ABNORMAL LOW (ref 8.9–10.3)
Chloride: 99 mmol/L (ref 98–111)
Creatinine, Ser: 1.55 mg/dL — ABNORMAL HIGH (ref 0.61–1.24)
GFR, Estimated: 46 mL/min — ABNORMAL LOW (ref >60)
Glucose, Bld: 121 mg/dL — ABNORMAL HIGH (ref 70–99)
Potassium: 4 mmol/L (ref 3.5–5.1)
Sodium: 130 mmol/L — ABNORMAL LOW (ref 135–145)

## 2023-11-04 MED ORDER — FUROSEMIDE 10 MG/ML IJ SOLN
40.0000 mg | Freq: Once | INTRAMUSCULAR | Status: AC
  Administered 2023-11-04: 40 mg via INTRAVENOUS
  Filled 2023-11-04: qty 4

## 2023-11-04 MED ORDER — SODIUM CHLORIDE 0.9% IV SOLUTION
Freq: Once | INTRAVENOUS | Status: AC
Start: 2023-11-04 — End: 2023-11-04

## 2023-11-04 NOTE — Plan of Care (Signed)
  Problem: Education: Goal: Knowledge of General Education information will improve Description: Including pain rating scale, medication(s)/side effects and non-pharmacologic comfort measures Outcome: Progressing   Problem: Health Behavior/Discharge Planning: Goal: Ability to manage health-related needs will improve Outcome: Progressing   Problem: Clinical Measurements: Goal: Ability to maintain clinical measurements within normal limits will improve Outcome: Progressing Goal: Will remain free from infection Outcome: Progressing Goal: Diagnostic test results will improve Outcome: Progressing Goal: Respiratory complications will improve Outcome: Progressing Goal: Cardiovascular complication will be avoided Outcome: Progressing   Problem: Activity: Goal: Risk for activity intolerance will decrease Outcome: Progressing   Problem: Nutrition: Goal: Adequate nutrition will be maintained Outcome: Progressing   Problem: Coping: Goal: Level of anxiety will decrease Outcome: Progressing   Problem: Elimination: Goal: Will not experience complications related to bowel motility Outcome: Progressing Goal: Will not experience complications related to urinary retention Outcome: Progressing   Problem: Pain Management: Goal: General experience of comfort will improve Outcome: Progressing   Problem: Safety: Goal: Ability to remain free from injury will improve Outcome: Progressing   Problem: Skin Integrity: Goal: Risk for impaired skin integrity will decrease Outcome: Progressing   Problem: Education: Goal: Understanding of CV disease, CV risk reduction, and recovery process will improve Outcome: Progressing Goal: Individualized Educational Video(s) Outcome: Progressing   Problem: Activity: Goal: Ability to return to baseline activity level will improve Outcome: Progressing   Problem: Cardiovascular: Goal: Ability to achieve and maintain adequate cardiovascular perfusion  will improve Outcome: Progressing Goal: Vascular access site(s) Level 0-1 will be maintained Outcome: Progressing   Problem: Health Behavior/Discharge Planning: Goal: Ability to safely manage health-related needs after discharge will improve Outcome: Progressing   Problem: Education: Goal: Will demonstrate proper wound care and an understanding of methods to prevent future damage Outcome: Progressing Goal: Knowledge of disease or condition will improve Outcome: Progressing Goal: Knowledge of the prescribed therapeutic regimen will improve Outcome: Progressing Goal: Individualized Educational Video(s) Outcome: Progressing   Problem: Activity: Goal: Risk for activity intolerance will decrease Outcome: Progressing   Problem: Cardiac: Goal: Will achieve and/or maintain hemodynamic stability Outcome: Progressing   Problem: Clinical Measurements: Goal: Postoperative complications will be avoided or minimized Outcome: Progressing   Problem: Respiratory: Goal: Respiratory status will improve Outcome: Progressing   Problem: Skin Integrity: Goal: Wound healing without signs and symptoms of infection Outcome: Progressing Goal: Risk for impaired skin integrity will decrease Outcome: Progressing   Problem: Urinary Elimination: Goal: Ability to achieve and maintain adequate renal perfusion and functioning will improve Outcome: Progressing

## 2023-11-04 NOTE — Progress Notes (Signed)
2 Days Post-Op Procedure(s) (LRB): AORTIC VALVE REPLACEMENT (AVR) USING 23 MM INSPIRIS RESILIA AORTIC VALVE (N/A) CORONARY ARTERY BYPASS GRAFTING (CABG) X TWO USING LEFT INTERNAL MAMMARY ARTERY AND RIGHT GREATER SAPHENEOUS VEIN HARVESTED ENDOSCOPICLY (N/A) TRANSESOPHAGEAL ECHOCARDIOGRAM (N/A) Subjective: No complaints.  Objective: Vital signs in last 24 hours: Temp:  [97.7 F (36.5 C)-98.7 F (37.1 C)] 98.5 F (36.9 C) (11/16 0744) Pulse Rate:  [65-82] 81 (11/16 0723) Cardiac Rhythm: A-V Sequential paced (11/16 0400) Resp:  [8-23] 13 (11/16 0723) BP: (90-139)/(52-118) 126/70 (11/16 0723) SpO2:  [90 %-100 %] 98 % (11/16 0723) Arterial Line BP: (115-126)/(45-70) 126/70 (11/16 0723) Weight:  [69.8 kg] 69.8 kg (11/16 0500)  Hemodynamic parameters for last 24 hours:    Intake/Output from previous day: 11/15 0701 - 11/16 0700 In: 1128.8 [P.O.:720; I.V.:108.7; IV Piggyback:300.1] Out: 720 [Urine:670; Chest Tube:50] Intake/Output this shift: Total I/O In: 315 [Blood:315] Out: -   General appearance: alert and cooperative Neurologic: intact Heart: regular rate and rhythm Lungs: clear to auscultation bilaterally Extremities: edema moderate Wound: dressings dry  Lab Results: Recent Labs    11/03/23 0308 11/04/23 0327  WBC 15.6* 12.7*  HGB 7.6* 6.6*  HCT 24.8* 21.2*  PLT 152 166   BMET:  Recent Labs    11/03/23 1627 11/04/23 0327  NA 130* 130*  K 4.3 4.0  CL 99 99  CO2 22 24  GLUCOSE 133* 121*  BUN 24* 27*  CREATININE 2.04* 1.55*  CALCIUM 7.8* 7.8*    PT/INR:  Recent Labs    11/02/23 1406  LABPROT 19.9*  INR 1.7*   ABG    Component Value Date/Time   PHART 7.326 (L) 11/02/2023 1859   HCO3 22.4 11/02/2023 1859   TCO2 24 11/02/2023 1859   ACIDBASEDEF 3.0 (H) 11/02/2023 1859   O2SAT 97 11/02/2023 1859   CBG (last 3)  Recent Labs    11/03/23 2338 11/04/23 0336 11/04/23 0742  GLUCAP 115* 132* 118*   CXR: bibasilar atelectasis, small left pleural  effusion.  Assessment/Plan: S/P Procedure(s) (LRB): AORTIC VALVE REPLACEMENT (AVR) USING 23 MM INSPIRIS RESILIA AORTIC VALVE (N/A) CORONARY ARTERY BYPASS GRAFTING (CABG) X TWO USING LEFT INTERNAL MAMMARY ARTERY AND RIGHT GREATER SAPHENEOUS VEIN HARVESTED ENDOSCOPICLY (N/A) TRANSESOPHAGEAL ECHOCARDIOGRAM (N/A)  POD 2  Hemodynamically stable in sinus 50-60. Will atrial pace at 80 for now. No beta blocker.  Hgb dropped to 6.6 from 7.6 yesterday am. No blood loss but I suspect it is red cell attrition from transfused red cells and dilution. Transfused 1 unit PRBC's this am and will diurese some and follow up Hgb later today. Iron started.  Creat coming back down.   Continue IS, ambulation.     LOS: 12 days    Alleen Borne 11/04/2023

## 2023-11-04 NOTE — Progress Notes (Signed)
Patient ID: Kyle Matthews, male   DOB: 1946/01/17, 77 y.o.   MRN: [id number] T[facility] Evening Rounds:  Hemodynamically stable. A-paced at 80.  UO good.  Hgb increased to 7.8 after transfusion.  Ambulated some and has been up in chair.

## 2023-11-05 LAB — TYPE AND SCREEN
ABO/RH(D): AB POS
Antibody Screen: NEGATIVE
Unit division: 0
Unit division: 0
Unit division: 0
Unit division: 0
Unit division: 0
Unit division: 0

## 2023-11-05 LAB — BPAM RBC
Blood Product Expiration Date: 202412062359
Blood Product Expiration Date: 202412062359
Blood Product Expiration Date: 202412082359
Blood Product Expiration Date: 202412092359
Blood Product Expiration Date: 202412092359
ISSUE DATE / TIME: 202411140736
ISSUE DATE / TIME: 202411140736
ISSUE DATE / TIME: 202411140736
ISSUE DATE / TIME: 202411142233
ISSUE DATE / TIME: 202411170926
ISSUE DATE / TIME: 202412092359
PRODUCT CODE: 202411160524
Unit Type and Rh: 202412092359
Unit Type and Rh: 202412092359
Unit Type and Rh: 202412092359
Unit Type and Rh: 6200
Unit Type and Rh: 6200
Unit Type and Rh: 6200
Unit Type and Rh: 6200
Unit Type and Rh: 6200

## 2023-11-05 LAB — ECHO INTRAOPERATIVE TEE
AR max vel: 0.75 cm2
AV Area VTI: 0.82 cm2
AV Area mean vel: 0.68 cm2
AV Mean grad: 51 mm[Hg]
AV Peak grad: 78.5 mm[Hg]
Ao pk vel: 4.43 m/s
Height: 63 in
P 1/2 time: 278 ms
Weight: 2292.78 [oz_av]

## 2023-11-05 LAB — CBC
HCT: 22.4 % — ABNORMAL LOW (ref 39.0–52.0)
Hemoglobin: 7 g/dL — ABNORMAL LOW (ref 13.0–17.0)
MCH: 27.5 pg (ref 26.0–34.0)
MCHC: 31.3 g/dL (ref 30.0–36.0)
MCV: 87.8 fL (ref 80.0–100.0)
Platelets: 179 10*3/uL (ref 150–400)
RBC: 2.55 MIL/uL — ABNORMAL LOW (ref 4.22–5.81)
RDW: 15.7 % — ABNORMAL HIGH (ref 11.5–15.5)
WBC: 10.3 10*3/uL (ref 4.0–10.5)
nRBC: 0 % (ref 0.0–0.2)

## 2023-11-05 LAB — BASIC METABOLIC PANEL
Anion gap: 4 — ABNORMAL LOW (ref 5–15)
BUN: 28 mg/dL — ABNORMAL HIGH (ref 8–23)
CO2: 26 mmol/L (ref 22–32)
Calcium: 7.7 mg/dL — ABNORMAL LOW (ref 8.9–10.3)
Chloride: 103 mmol/L (ref 98–111)
Creatinine, Ser: 1.01 mg/dL (ref 0.61–1.24)
GFR, Estimated: >60 mL/min (ref >60)
Glucose, Bld: 116 mg/dL — ABNORMAL HIGH (ref 70–99)
Potassium: 3.8 mmol/L (ref 3.5–5.1)
Sodium: 133 mmol/L — ABNORMAL LOW (ref 135–145)

## 2023-11-05 LAB — PREPARE RBC (CROSSMATCH)

## 2023-11-05 LAB — GLUCOSE, CAPILLARY
Glucose-Capillary: 107 mg/dL — ABNORMAL HIGH (ref 70–99)
Glucose-Capillary: 104 mg/dL — ABNORMAL HIGH (ref 70–99)
Glucose-Capillary: 95 mg/dL (ref 70–99)

## 2023-11-05 MED ORDER — CHLORHEXIDINE GLUCONATE CLOTH 2 % EX PADS
6.0000 | MEDICATED_PAD | Freq: Every day | CUTANEOUS | Status: DC
  Administered 2023-11-05 – 2023-11-08 (×4): 6 via TOPICAL

## 2023-11-05 MED ORDER — SODIUM CHLORIDE 0.9% FLUSH
3.0000 mL | Freq: Two times a day (BID) | INTRAVENOUS | Status: DC
  Administered 2023-11-05 – 2023-11-08 (×6): 3 mL via INTRAVENOUS

## 2023-11-05 MED ORDER — ~~LOC~~ CARDIAC SURGERY, PATIENT & FAMILY EDUCATION: Freq: Once | Status: AC

## 2023-11-05 MED ORDER — SODIUM CHLORIDE 0.9% IV SOLUTION: Freq: Once | INTRAVENOUS | Status: AC

## 2023-11-05 MED ORDER — SODIUM CHLORIDE 0.9 % IV SOLN
250.0000 mL | INTRAVENOUS | Status: AC | PRN
Start: 2023-11-05 — End: 2023-11-06

## 2023-11-05 MED ORDER — FUROSEMIDE 40 MG PO TABS
40.0000 mg | ORAL_TABLET | Freq: Every day | ORAL | Status: AC
  Administered 2023-11-06 – 2023-11-08 (×3): 40 mg via ORAL
  Filled 2023-11-05 (×3): qty 1

## 2023-11-05 MED ORDER — SODIUM CHLORIDE 0.9% FLUSH: 3.0000 mL | INTRAVENOUS | Status: DC | PRN

## 2023-11-05 MED ORDER — POTASSIUM CHLORIDE CRYS ER 20 MEQ PO TBCR
20.0000 meq | EXTENDED_RELEASE_TABLET | Freq: Two times a day (BID) | ORAL | Status: AC
  Administered 2023-11-05 – 2023-11-07 (×6): 20 meq via ORAL
  Filled 2023-11-05 (×5): qty 1

## 2023-11-05 MED ORDER — DOCUSATE SODIUM 100 MG PO CAPS
200.0000 mg | ORAL_CAPSULE | Freq: Every day | ORAL | Status: DC
  Administered 2023-11-05 – 2023-11-08 (×4): 200 mg via ORAL
  Filled 2023-11-05 (×3): qty 2

## 2023-11-05 MED ORDER — POTASSIUM CHLORIDE CRYS ER 20 MEQ PO TBCR
40.0000 meq | EXTENDED_RELEASE_TABLET | Freq: Once | ORAL | Status: AC
  Administered 2023-11-05: 40 meq via ORAL
  Filled 2023-11-05: qty 2

## 2023-11-05 MED ORDER — FUROSEMIDE 10 MG/ML IJ SOLN
40.0000 mg | Freq: Once | INTRAMUSCULAR | Status: AC
  Administered 2023-11-05: 40 mg via INTRAVENOUS
  Filled 2023-11-05: qty 4

## 2023-11-05 NOTE — Plan of Care (Signed)
  Problem: Education: Goal: Knowledge of General Education information will improve Description: Including pain rating scale, medication(s)/side effects and non-pharmacologic comfort measures Outcome: Progressing   Problem: Health Behavior/Discharge Planning: Goal: Ability to manage health-related needs will improve Outcome: Progressing   Problem: Clinical Measurements: Goal: Ability to maintain clinical measurements within normal limits will improve Outcome: Progressing Goal: Will remain free from infection Outcome: Progressing Goal: Diagnostic test results will improve Outcome: Progressing Goal: Respiratory complications will improve Outcome: Progressing Goal: Cardiovascular complication will be avoided Outcome: Progressing   Problem: Activity: Goal: Risk for activity intolerance will decrease Outcome: Progressing   Problem: Nutrition: Goal: Adequate nutrition will be maintained Outcome: Progressing   Problem: Coping: Goal: Level of anxiety will decrease Outcome: Progressing   Problem: Elimination: Goal: Will not experience complications related to bowel motility Outcome: Progressing Goal: Will not experience complications related to urinary retention Outcome: Progressing   Problem: Pain Management: Goal: General experience of comfort will improve Outcome: Progressing   Problem: Skin Integrity: Goal: Risk for impaired skin integrity will decrease Outcome: Progressing   Problem: Education: Goal: Understanding of CV disease, CV risk reduction, and recovery process will improve Outcome: Progressing Goal: Individualized Educational Video(s) Outcome: Progressing   Problem: Health Behavior/Discharge Planning: Goal: Ability to safely manage health-related needs after discharge will improve Outcome: Progressing

## 2023-11-05 NOTE — Progress Notes (Signed)
3 Days Post-Op Procedure(s) (LRB): AORTIC VALVE REPLACEMENT (AVR) USING 23 MM INSPIRIS RESILIA AORTIC VALVE (N/A) CORONARY ARTERY BYPASS GRAFTING (CABG) X TWO USING LEFT INTERNAL MAMMARY ARTERY AND RIGHT GREATER SAPHENEOUS VEIN HARVESTED ENDOSCOPICLY (N/A) TRANSESOPHAGEAL ECHOCARDIOGRAM (N/A) Subjective: No complaints. Ambulated this am. Eating well.  Objective: Vital signs in last 24 hours: Temp:  [98.1 F (36.7 C)-98.5 F (36.9 C)] 98.1 F (36.7 C) (11/17 0804) Pulse Rate:  [79-212] 79 (11/17 0800) Cardiac Rhythm: Atrial paced (11/17 0000) Resp:  [10-20] 16 (11/17 0800) BP: (91-125)/(53-95) 110/65 (11/17 0800) SpO2:  [88 %-100 %] 91 % (11/17 0800) Weight:  [68.3 kg] 68.3 kg (11/17 0500)  Hemodynamic parameters for last 24 hours:    Intake/Output from previous day: 11/16 0701 - 11/17 0700 In: 415 [Blood:315; IV Piggyback:100] Out: 1460 [Urine:1460] Intake/Output this shift: No intake/output data recorded.  General appearance: alert and cooperative Neurologic: intact Heart: regular rate and rhythm Lungs: clear to auscultation bilaterally Extremities: edema mild Wound: incisions ok  Lab Results: Recent Labs    11/04/23 1451 11/05/23 0416  WBC 12.0* 10.3  HGB 7.8* 7.0*  HCT 23.9* 22.4*  PLT 180 179   BMET:  Recent Labs    11/04/23 0327 11/05/23 0416  NA 130* 133*  K 4.0 3.8  CL 99 103  CO2 24 26  GLUCOSE 121* 116*  BUN 27* 28*  CREATININE 1.55* 1.01  CALCIUM 7.8* 7.7*    PT/INR:  Recent Labs    11/02/23 1406  LABPROT 19.9*  INR 1.7*   ABG    Component Value Date/Time   PHART 7.326 (L) 11/02/2023 1859   HCO3 22.4 11/02/2023 1859   TCO2 24 11/02/2023 1859   ACIDBASEDEF 3.0 (H) 11/02/2023 1859   O2SAT 97 11/02/2023 1859   CBG (last 3)  Recent Labs    11/04/23 2342 11/05/23 0340 11/05/23 0801  GLUCAP 131* 107* 104*    Assessment/Plan: S/P Procedure(s) (LRB): AORTIC VALVE REPLACEMENT (AVR) USING 23 MM INSPIRIS RESILIA AORTIC VALVE  (N/A) CORONARY ARTERY BYPASS GRAFTING (CABG) X TWO USING LEFT INTERNAL MAMMARY ARTERY AND RIGHT GREATER SAPHENEOUS VEIN HARVESTED ENDOSCOPICLY (N/A) TRANSESOPHAGEAL ECHOCARDIOGRAM (N/A)  POD 3  Hemodynamically stable in sinus rhythm 66-70's. Will keep pacer wires in today but roll and tape. Hold off beta blocker.  Hgb improved some with transfusion but still only 7.0 this am so will give him another unit of PRBC's. Continue iron. No further hematuria.  Wt is about 7 lbs over preop. Continue diuresis.  Creat back to normal.  Keep foley in. He is going to followup with urology for prostate surgery.  IS, OOB. Transfer to 4E.    LOS: 13 days    Alleen Borne 11/05/2023

## 2023-11-05 NOTE — Anesthesia Postprocedure Evaluation (Signed)
Anesthesia Post Note  Patient: Kyle Matthews  Procedure(s) Performed: AORTIC VALVE REPLACEMENT (AVR) USING 23 MM INSPIRIS RESILIA AORTIC VALVE (Chest) CORONARY ARTERY BYPASS GRAFTING (CABG) X TWO USING LEFT INTERNAL MAMMARY ARTERY AND RIGHT GREATER SAPHENEOUS VEIN HARVESTED ENDOSCOPICLY (Chest) TRANSESOPHAGEAL ECHOCARDIOGRAM     Patient location during evaluation: SICU Anesthesia Type: General Level of consciousness: sedated Pain management: pain level controlled Vital Signs Assessment: post-procedure vital signs reviewed and stable Respiratory status: patient remains intubated per anesthesia plan Cardiovascular status: stable Postop Assessment: no apparent nausea or vomiting Anesthetic complications: no   No notable events documented.  Last Vitals:  Vitals:   11/05/23 0600 11/05/23 0700  BP: 125/66 124/62  Pulse: 80 81  Resp: 20 16  Temp:    SpO2: 96% 98%    Last Pain:  Vitals:   11/05/23 0400  TempSrc:   PainSc: 0-No pain                 Elam Ellis S

## 2023-11-06 LAB — BPAM RBC
Blood Product Expiration Date: 202412092359
ISSUE DATE / TIME: 202411170946
Unit Type and Rh: 6200

## 2023-11-06 LAB — CBC
HCT: 26.3 % — ABNORMAL LOW (ref 39.0–52.0)
Hemoglobin: 8.4 g/dL — ABNORMAL LOW (ref 13.0–17.0)
MCH: 28.1 pg (ref 26.0–34.0)
MCHC: 31.9 g/dL (ref 30.0–36.0)
MCV: 88 fL (ref 80.0–100.0)
Platelets: 222 10*3/uL (ref 150–400)
RBC: 2.99 MIL/uL — ABNORMAL LOW (ref 4.22–5.81)
RDW: 15.3 % (ref 11.5–15.5)
WBC: 7.9 10*3/uL (ref 4.0–10.5)
nRBC: 0 % (ref 0.0–0.2)

## 2023-11-06 LAB — BASIC METABOLIC PANEL
Anion gap: 6 (ref 5–15)
BUN: 17 mg/dL (ref 8–23)
CO2: 25 mmol/L (ref 22–32)
Calcium: 8 mg/dL — ABNORMAL LOW (ref 8.9–10.3)
Chloride: 106 mmol/L (ref 98–111)
Creatinine, Ser: 0.69 mg/dL (ref 0.61–1.24)
GFR, Estimated: >60 mL/min (ref >60)
Glucose, Bld: 99 mg/dL (ref 70–99)
Potassium: 4.2 mmol/L (ref 3.5–5.1)
Sodium: 137 mmol/L (ref 135–145)

## 2023-11-06 LAB — TYPE AND SCREEN
ABO/RH(D): AB POS
Antibody Screen: NEGATIVE
Unit division: 0

## 2023-11-06 MED ORDER — AMIODARONE HCL IN DEXTROSE 360-4.14 MG/200ML-% IV SOLN: 60.0000 mg/h | INTRAVENOUS | Status: DC

## 2023-11-06 MED ORDER — AMIODARONE HCL IN DEXTROSE 360-4.14 MG/200ML-% IV SOLN
60.0000 mg/h | INTRAVENOUS | Status: AC
  Administered 2023-11-06 (×2): 60 mg/h via INTRAVENOUS
  Filled 2023-11-06: qty 200

## 2023-11-06 MED ORDER — AMIODARONE LOAD VIA INFUSION: 150.0000 mg | Freq: Once | INTRAVENOUS | Status: DC

## 2023-11-06 MED ORDER — AMIODARONE HCL IN DEXTROSE 360-4.14 MG/200ML-% IV SOLN
30.0000 mg/h | INTRAVENOUS | Status: DC
  Administered 2023-11-06 – 2023-11-07 (×2): 30 mg/h via INTRAVENOUS
  Filled 2023-11-06 (×3): qty 200

## 2023-11-06 MED ORDER — AMIODARONE HCL IN DEXTROSE 360-4.14 MG/200ML-% IV SOLN: 30.0000 mg/h | INTRAVENOUS | Status: DC

## 2023-11-06 MED ORDER — AMIODARONE LOAD VIA INFUSION
150.0000 mg | Freq: Once | INTRAVENOUS | Status: AC
  Administered 2023-11-06: 150 mg via INTRAVENOUS
  Filled 2023-11-06: qty 83.34

## 2023-11-06 MED FILL — Potassium Chloride Inj 2 mEq/ML: INTRAVENOUS | Qty: 40 | Status: AC

## 2023-11-06 MED FILL — Heparin Sodium (Porcine) Inj 1000 Unit/ML: Qty: 1000 | Status: AC

## 2023-11-06 MED FILL — Lidocaine HCl Local Preservative Free (PF) Inj 2%: INTRAMUSCULAR | Qty: 14 | Status: AC

## 2023-11-06 NOTE — Progress Notes (Signed)
Received call from tele tech that pt's heart rate was sustaining in the 150s. Care RN went to pt room and seen pt was walking around in the bathroom. Pt asymptomatic. Checked monitor pt in a-fib 140s-150s sustained. Returned pt to bed , provider Bartle paged, see new orders.

## 2023-11-06 NOTE — Progress Notes (Addendum)
      [address]       [place] [address]             [phone]       4 Days Post-Op Procedure(s) (LRB): AORTIC VALVE REPLACEMENT (AVR) USING 23 MM INSPIRIS RESILIA AORTIC VALVE (N/A) CORONARY ARTERY BYPASS GRAFTING (CABG) X TWO USING LEFT INTERNAL MAMMARY ARTERY AND RIGHT GREATER SAPHENEOUS VEIN HARVESTED ENDOSCOPICLY (N/A) TRANSESOPHAGEAL ECHOCARDIOGRAM (N/A)  Subjective:  Patient doing okay.  Developed A. Fib with RVR this morning.  He thinks having a BM brought this on as he was straining.  Objective: Vital signs in last 24 hours: Temp:  [98.1 F (36.7 C)-98.3 F (36.8 C)] 98.1 F (36.7 C) (11/17 2327) Pulse Rate:  [58-90] 66 (11/17 1945) Cardiac Rhythm: Normal sinus rhythm;Bundle branch block (11/17 2031) Resp:  [13-20] 20 (11/17 2327) BP: (83-139)/(56-79) 119/56 (11/18 0303) SpO2:  [91 %-100 %] 95 % (11/17 1945) Weight:  [65.8 kg-71.2 kg] 71.2 kg (11/18 0001)  Intake/Output from previous day: 11/17 0701 - 11/18 0700 In: 734.2 [I.V.:419.2; Blood:315] Out: 750 [Urine:750] Intake/Output this shift: Total I/O In: -  Out: 800 [Urine:800]  General appearance: alert, cooperative, and no distress Heart: irregularly irregular rhythm Lungs: clear to auscultation bilaterally Abdomen: soft, non-tender; bowel sounds normal; no masses,  no organomegaly Extremities: edema trace Wound: clean and dry  Lab Results: Recent Labs    11/05/23 0416 11/06/23 0607  WBC 10.3 7.9  HGB 7.0* 8.4*  HCT 22.4* 26.3*  PLT 179 222   BMET:  Recent Labs    11/05/23 0416 11/06/23 0607  NA 133* 137  K 3.8 4.2  CL 103 106  CO2 26 25  GLUCOSE 116* 99  BUN 28* 17  CREATININE 1.01 0.69  CALCIUM 7.7* 8.0*    PT/INR: No results for input(s): "LABPROT", "INR" in the last 72 hours. ABG    Component Value Date/Time   PHART 7.326 (L) 11/02/2023 1859   HCO3 22.4 11/02/2023 1859   TCO2 24 11/02/2023 1859   ACIDBASEDEF 3.0 (H) 11/02/2023 1859   O2SAT 97  11/02/2023 1859   CBG (last 3)  Recent Labs    11/05/23 0340 11/05/23 0801 11/05/23 1119  GLUCAP 107* 104* 95    Assessment/Plan: S/P Procedure(s) (LRB): AORTIC VALVE REPLACEMENT (AVR) USING 23 MM INSPIRIS RESILIA AORTIC VALVE (N/A) CORONARY ARTERY BYPASS GRAFTING (CABG) X TWO USING LEFT INTERNAL MAMMARY ARTERY AND RIGHT GREATER SAPHENEOUS VEIN HARVESTED ENDOSCOPICLY (N/A) TRANSESOPHAGEAL ECHOCARDIOGRAM (N/A)  CV- A. Fib with RVR, converted to NSR @0740 - continue Amiodarone protocol.. monitor.. patient is not a candidate for anticoagulation Pulm- no acute issues, continue IS Renal-creatinine stable, weight remains elevated.. continue Lasix, potassium Chronic Anemia, due to blood loss with elevated prostate.. he underwent embolization by IR prior to bypass surgery.. he has received 2 units of packed cells.. 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 Lovett Sox 11/06/2023

## 2023-11-06 NOTE — Progress Notes (Signed)
Mobility Specialist Progress Note:   11/06/23 1049  Mobility  Activity Ambulated with assistance in hallway  Level of Assistance Standby assist, set-up cues, supervision of patient - no hands on  Assistive Device Front wheel walker  Distance Ambulated (ft) 300 ft  RUE Weight Bearing NWB  LUE Weight Bearing NWB  Activity Response Tolerated well  Mobility Referral Yes  $Mobility charge 1 Mobility  Mobility Specialist Start Time (ACUTE ONLY) 0950  Mobility Specialist Stop Time (ACUTE ONLY) 1007  Mobility Specialist Time Calculation (min) (ACUTE ONLY) 17 min   Pt received on BSC, agreeable to mobility. Pt able to stand and ambulate with SB assist. C/o general weakness today requesting to use RW. Pt denied any other discomfort, asx throughout. VSS. Pt left EOB with RN and NT present in room.   Leory Plowman  Mobility Specialist Please contact via [employer] office at [phone]

## 2023-11-06 NOTE — Evaluation (Signed)
Occupational Therapy Evaluation Patient Details Name: Kyle Matthews MRN: [id number] DOB: [birth year] Today's Date: 11/06/2023   History of Present Illness 77 yo male admitted 11/4 with chest pain, anemia and hematuria. Pt with severe AS with ST depression and prostate enlargement. 11/8 prostate artery embolization. 11/14 CABG x 2. PMH: CAD, aortic stenosis.   Clinical Impression   PTA, pt lives with family and typically Independent with ADLs and mobility. Pt presents now with minor deficits in discomfort at incision site and activity tolerance. Educated on sternal precautions for ADLs,  bed mobility and daily routine. Pt requires Min A for bed mobility and overall Min A for ADLs d/t sternal precautions. Pt's wife present and engaged; educated how family can assist pt at home, considerations of shower chair and encouraged continued mobility. No OT follow up needed at DC but will follow distantly while admitted for further ADL retraining.        If plan is discharge home, recommend the following: Assist for transportation;Assistance with cooking/housework;A little help with bathing/dressing/bathroom    Functional Status Assessment  Patient has had a recent decline in their functional status and demonstrates the ability to make significant improvements in function in a reasonable and predictable amount of time.  Equipment Recommendations  None recommended by OT (pt/family may purchase shower chair at DC)    Recommendations for Other Services       Precautions / Restrictions Precautions Precautions: Fall;Sternal Precaution Booklet Issued: Yes (comment) Restrictions Weight Bearing Restrictions: No      Mobility Bed Mobility Overal bed mobility: Needs Assistance Bed Mobility: Supine to Sit, Sit to Supine     Supine to sit: Min assist Sit to supine: Contact guard assist   General bed mobility comments: A to lift trunk; partially sidelying for task (likely would improve with full roll  to side). cued for sidelying on return to bed with pt able to guide trunk and CGA for LE mgmt.    Transfers Overall transfer level: Independent Equipment used: None                      Balance Overall balance assessment: No apparent balance deficits (not formally assessed)                                         ADL either performed or assessed with clinical judgement   ADL Overall ADL's : Needs assistance/impaired Eating/Feeding: Independent   Grooming: Modified independent;Standing   Upper Body Bathing: Minimal assistance;Sitting   Lower Body Bathing: Modified independent;Sit to/from stand;Sitting/lateral leans   Upper Body Dressing : Minimal assistance;Sitting Upper Body Dressing Details (indicate cue type and reason): typically wears button ups, simulated with gown around back though pt unable to don around neck/on second arm without breaking precautions. wife present, able to assist if needed at home Lower Body Dressing: Minimal assistance;Sit to/from stand;Sitting/lateral leans Lower Body Dressing Details (indicate cue type and reason): able to cross LE partially (typically pulls on LE to cross for sock mgmt). able to bend to reach to touch upper part of socks but not toes Toilet Transfer: Modified Independent   Toileting- Clothing Manipulation and Hygiene: Supervision/safety         General ADL Comments: Emphasis on sternal precautions for daily tasks w/ family education on where assist may be needed     Vision Baseline Vision/History: 1 Wears glasses Ability to See in  Adequate Light: 0 Adequate Patient Visual Report: No change from baseline Vision Assessment?: No apparent visual deficits     Perception         Praxis         Pertinent Vitals/Pain Pain Assessment Pain Assessment: Faces Faces Pain Scale: Hurts a little bit Pain Location: sternal incision Pain Descriptors / Indicators: Sore, Grimacing Pain Intervention(s):  Monitored during session, Limited activity within patient's tolerance     Extremity/Trunk Assessment Upper Extremity Assessment Upper Extremity Assessment: Overall WFL for tasks assessed;Right hand dominant   Lower Extremity Assessment Lower Extremity Assessment: Defer to PT evaluation   Cervical / Trunk Assessment Cervical / Trunk Assessment: Normal   Communication Communication Communication: No apparent difficulties   Cognition Arousal: Alert Behavior During Therapy: WFL for tasks assessed/performed Overall Cognitive Status: Within Functional Limits for tasks assessed                                       General Comments  Wife at bedside, HR WFL    Exercises     Shoulder Instructions      Home Living Family/patient expects to be discharged to:: Private residence Living Arrangements: Spouse/significant other;Children Available Help at Discharge: Family;Available 24 hours/day Type of Home: House Home Access: Stairs to enter [employer] of Steps: 1+2+1 step up from drive way to walk way then 2 steps up from drive way to landing Entrance Stairs-Rails: Right;Left Home Layout: Able to live on main level with bedroom/bathroom;Two level     Bathroom Shower/Tub: Chief Strategy Officer: Standard     Home Equipment: None          Prior Functioning/Environment Prior Level of Function : Independent/Modified Independent;Driving             Mobility Comments: Pt reports ind without AD. ADLs Comments: Typically Independent with ADLs, Since Oct hospitalizations, wife has been driving (issues w/ foley cath mgmt and driving), intermittent assist when not feeling well. Was mowing the yard but had to have some assist w/ that recently as well        OT Problem List: Decreased knowledge of precautions;Decreased activity tolerance      OT Treatment/Interventions: Self-care/ADL training;Therapeutic exercise;Energy conservation;DME  and/or AE instruction;Therapeutic activities;Patient/family education;Balance training    OT Goals(Current goals can be found in the care plan section) Acute Rehab OT Goals Patient Stated Goal: increase independence, wife wants to be able to provide the needed assist OT Goal Formulation: With patient/family Time For Goal Achievement: 11/20/23 Potential to Achieve Goals: Good ADL Goals Pt Will Perform Upper Body Dressing: with modified independence Pt Will Perform Lower Body Dressing: with modified independence;sit to/from stand Pt Will Perform Tub/Shower Transfer: Tub transfer;with set-up;ambulating Additional ADL Goal #1: Pt to demo bed mobility with Modified Independence in prep for ADL tasks  OT Frequency: Min 1X/week    Co-evaluation              AM-PAC OT "6 Clicks" Daily Activity     Outcome Measure Help from another person eating meals?: None Help from another person taking care of personal grooming?: None Help from another person toileting, which includes using toliet, bedpan, or urinal?: A Little Help from another person bathing (including washing, rinsing, drying)?: A Little Help from another person to put on and taking off regular upper body clothing?: A Little Help from another person to put on and taking  off regular lower body clothing?: A Little 6 Click Score: 20   End of Session Nurse Communication: Mobility status  Activity Tolerance: Patient tolerated treatment well Patient left: in bed;with call bell/phone within reach;with family/visitor present;with nursing/sitter in room  OT Visit Diagnosis: Other abnormalities of gait and mobility (R26.89)                Time: 1884-1660 OT Time Calculation (min): 29 min Charges:  OT General Charges $OT Visit: 1 Visit OT Evaluation $OT Eval Low Complexity: 1 Low OT Treatments $Self Care/Home Management : 8-22 mins  Bradd Canary, OTR/L Acute Rehab Services Office: [phone]   Lorre Munroe 11/06/2023, 1:43 PM

## 2023-11-06 NOTE — Progress Notes (Signed)
CARDIAC REHAB PHASE I   Pt oob in hallway walking with mobility team. Pt ambulating at slow steady pace using  front wheel walker. PT/OT consulted for evaluation today. Will continue to follow.     [phone] Woodroe Chen, RN BSN 11/06/2023 10:06 AM

## 2023-11-07 LAB — CBC
HCT: 27.2 % — ABNORMAL LOW (ref 39.0–52.0)
Hemoglobin: 8.6 g/dL — ABNORMAL LOW (ref 13.0–17.0)
MCH: 27.7 pg (ref 26.0–34.0)
MCHC: 31.6 g/dL (ref 30.0–36.0)
MCV: 87.7 fL (ref 80.0–100.0)
Platelets: 212 10*3/uL (ref 150–400)
RBC: 3.1 MIL/uL — ABNORMAL LOW (ref 4.22–5.81)
RDW: 15.6 % — ABNORMAL HIGH (ref 11.5–15.5)
WBC: 8.5 10*3/uL (ref 4.0–10.5)
nRBC: 0 % (ref 0.0–0.2)

## 2023-11-07 MED ORDER — AMIODARONE HCL 200 MG PO TABS
400.0000 mg | ORAL_TABLET | Freq: Two times a day (BID) | ORAL | Status: DC
  Administered 2023-11-07 – 2023-11-08 (×3): 400 mg via ORAL
  Filled 2023-11-07 (×3): qty 2

## 2023-11-07 NOTE — Progress Notes (Signed)
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 Bowels working well.  Objective: Vital signs in last 24 hours: Temp:  [98.2 F (36.8 C)-98.8 F (37.1 C)] 98.8 F (37.1 C) (11/19 0404) Pulse Rate:  [60-66] 61 (11/19 0404) Cardiac Rhythm: Normal sinus rhythm (11/19 0405) Resp:  [18-20] 18 (11/19 0404) BP: (119-155)/(70-81) 155/77 (11/19 0404) SpO2:  [96 %-97 %] 96 % (11/19 0404) Weight:  [69.5 kg] 69.5 kg (11/19 0404)  Hemodynamic parameters for last 24 hours:    Intake/Output from previous day: 11/18 0701 - 11/19 0700 In: 590 [P.O.:590] Out: 2425 [Urine:2425] Intake/Output this shift: Total I/O In: 350 [P.O.:350] Out: 800 [Urine:800]  General appearance: alert and cooperative Neurologic: intact Heart: regular rate and rhythm Lungs: clear to auscultation bilaterally Extremities: no edema Wound: incision healing well.  Lab Results: Recent Labs    11/06/23 0607 11/07/23 0518  WBC 7.9 8.5  HGB 8.4* 8.6*  HCT 26.3* 27.2*  PLT 222 212   BMET:  Recent Labs    11/05/23 0416 11/06/23 0607  NA 133* 137  K 3.8 4.2  CL 103 106  CO2 26 25  GLUCOSE 116* 99  BUN 28* 17  CREATININE 1.01 0.69  CALCIUM 7.7* 8.0*    PT/INR: No results for input(s): "LABPROT", "INR" in the last 72 hours. ABG    Component Value Date/Time   PHART 7.326 (L) 11/02/2023 1859   HCO3 22.4 11/02/2023 1859   TCO2 24 11/02/2023 1859   ACIDBASEDEF 3.0 (H) 11/02/2023 1859   O2SAT 97 11/02/2023 1859   CBG (last 3)  Recent Labs    11/05/23 0340 11/05/23 0801 11/05/23 1119  GLUCAP 107* 104* 95    Assessment/Plan: S/P Procedure(s) (LRB): AORTIC VALVE REPLACEMENT (AVR) USING 23 MM INSPIRIS RESILIA AORTIC VALVE  (N/A) CORONARY ARTERY BYPASS GRAFTING (CABG) X TWO USING LEFT INTERNAL MAMMARY ARTERY AND RIGHT GREATER SAPHENEOUS VEIN HARVESTED ENDOSCOPICLY (N/A) TRANSESOPHAGEAL ECHOCARDIOGRAM (N/A)  POD 5 Hemodynamically stable in sinus rhythm 60's on IV amio. Will convert to po this am and follow.  Remove pacing wires  -1800 cc yesterday. Wt still 10 lbs over preop if accurate. Continue lasix and KCL for now.  Hgb starting to increase. 8.6 today.  Hx of hematuria from BPH with indwelling foley. ASA 81 mg only. Will follow up with urology for eventual prostatectomy.  Plan home tomorrow if rhythm stable on oral amio.   LOS: 15 days    Alleen Borne 11/07/2023

## 2023-11-07 NOTE — Plan of Care (Signed)

## 2023-11-07 NOTE — Evaluation (Signed)
Physical Therapy Brief Evaluation and Discharge Note Patient Details Name: Kyle Matthews MRN: [id number] DOB: [birth year] Today's Date: 11/07/2023   History of Present Illness  77 yo male admitted 11/4 with chest pain, anemia and hematuria. Pt with severe AS with ST depression and prostate enlargement. 11/8 prostate artery embolization. 11/14 CABG x 2. PMH: CAD, aortic stenosis.  Clinical Impression  Pt presenting s/p CABG x2, supervision in all mobility to assist with line management, does not require use of AD. Pt reports feeling near baseline only limited by precautions. Pt does not need further skilled acute physical therapy. Defer follow up to cardiac rehab.        PT Assessment Patient does not need any further PT services  Assistance Needed at Discharge  None    Equipment Recommendations None recommended by PT  Recommendations for Other Services       Precautions/Restrictions Precautions Precautions: Fall;Sternal Precaution Booklet Issued: No Restrictions Weight Bearing Restrictions: Yes RUE Weight Bearing: Non weight bearing LUE Weight Bearing: Non weight bearing        Mobility  Bed Mobility Rolling: Supervision Supine/Sidelying to sit: Supervision   General bed mobility comments: verbal cueing for facilitation and precautions, line management  Transfers Overall transfer level: Needs assistance Equipment used: None Transfers: Sit to/from Stand Sit to Stand: Supervision           General transfer comment: line management    Ambulation/Gait Ambulation/Gait assistance: Supervision Gait Distance (Feet): 300 Feet Assistive device: None Gait Pattern/deviations: WFL(Within Functional Limits) Gait Speed: Pace WFL General Gait Details: line management, pt reports feeling about normal with gait  Home Activity Instructions    Stairs Stairs: Yes Stairs assistance: Supervision Stair Management: No rails, Step to pattern Number of Stairs: 2 General stair  comments: step to pattern, rests left hand on rail, cueing to maintai precautions, line management  Modified Rankin (Stroke Patients Only)        Balance                 Standardized Balance Assessment Standardized Balance Assessment : Dynamic Gait Index Dynamic Gait Index Level Surface: Normal Change in Gait Speed: Normal Gait with Horizontal Head Turns: Normal Gait with Vertical Head Turns: Normal Gait and Pivot Turn: Normal Step Over Obstacle: Normal Step Around Obstacles: Normal Steps: Mild Impairment Total Score: 23      Pertinent Vitals/Pain PT - Brief Vital Signs All Vital Signs Stable: Yes Pain Assessment Pain Assessment: Faces Faces Pain Scale: Hurts a little bit Pain Location: sternal incision Pain Descriptors / Indicators: Sore, Grimacing Pain Intervention(s): Limited activity within patient's tolerance, Monitored during session     Home Living Family/patient expects to be discharged to:: Private residence Living Arrangements: Spouse/significant other;Children Available Help at Discharge: Family;Available 24 hours/day Home Environment: Stairs to enter  [employer] of Steps: 1+2+1 Home Equipment: None        Prior Function Level of Independence: Independent      UE/LE Assessment   UE ROM/Strength/Tone/Coordination: WFL (limited by sternal precautions)    LE ROM/Strength/Tone/Coordination: [facility]      Communication   Communication Communication: No apparent difficulties     Cognition Overall Cognitive Status: Appears within functional limits for tasks assessed/performed       General Comments      Exercises     Assessment/Plan    PT Problem List         PT Visit Diagnosis Muscle weakness (generalized) (M62.81)    No Skilled PT Patient at baseline level  of functioning   Co-evaluation                AMPAC 6 Clicks Help needed turning from your back to your side while in a flat bed without using bedrails?:  None Help needed moving from lying on your back to sitting on the side of a flat bed without using bedrails?: None Help needed moving to and from a bed to a chair (including a wheelchair)?: None Help needed standing up from a chair using your arms (e.g., wheelchair or bedside chair)?: None Help needed to walk in hospital room?: None Help needed climbing 3-5 steps with a railing? : None 6 Click Score: 24      End of Session   Activity Tolerance: Patient tolerated treatment well Patient left: with call bell/phone within reach;Other (comment) (in restroom) Nurse Communication: Mobility status PT Visit Diagnosis: Muscle weakness (generalized) (M62.81)     Time: 4540-9811 PT Time Calculation (min) (ACUTE ONLY): 17 min  Charges:   PT Evaluation $PT Eval Low Complexity: 1 Low      Kyle Matthews, W. SPT Secure chat preferred   Darlin Drop  11/07/2023, 9:04 AM

## 2023-11-07 NOTE — Care Management Important Message (Signed)
Important Message  Patient Details  Name: Kyle Matthews MRN: [id number] Date of Birth: 1946-02-26   Important Message Given:  Yes - Medicare IM     Sherilyn Banker 11/07/2023, 2:26 PM

## 2023-11-07 NOTE — Progress Notes (Signed)
CARDIAC REHAB PHASE I   PRE:  Rate/Rhythm: 68 NSR  BP:  Sitting: 137/68      SpO2: 97 RA  MODE:  Ambulation: 470 ft    POST:  Rate/Rhythm: 86 NSR  BP:  Sitting: 169/74      SpO2: 97 RA  Pt ambulated with supervision assist, pt held foley while walking. Pt denies symptoms during walk and was helped back to bed with min assist.   Faustino Congress  MS, ACSM-CEP 11:23 AM 11/07/2023    Service time is from 1110 to 1125.

## 2023-11-08 ENCOUNTER — Other Ambulatory Visit (HOSPITAL_COMMUNITY): Payer: Self-pay

## 2023-11-08 MED ORDER — FE FUM-VIT C-VIT B12-FA 460-60-0.01-1 MG PO CAPS
1.0000 | ORAL_CAPSULE | Freq: Every day | ORAL | 3 refills | Status: DC
  Filled 2023-11-08: qty 30, 30d supply, fill #0
  Filled 2023-12-07 – 2024-01-09 (×2): qty 30, 30d supply, fill #1

## 2023-11-08 MED ORDER — AMIODARONE HCL 200 MG PO TABS: 400.0000 mg | ORAL_TABLET | Freq: Two times a day (BID) | ORAL | 1 refills | Status: DC

## 2023-11-08 MED ORDER — AMLODIPINE BESYLATE 5 MG PO TABS
5.0000 mg | ORAL_TABLET | Freq: Every day | ORAL | Status: DC
  Administered 2023-11-08: 5 mg via ORAL
  Filled 2023-11-08: qty 1

## 2023-11-08 MED ORDER — OXYCODONE HCL 5 MG PO TABS: 5.0000 mg | ORAL_TABLET | ORAL | 0 refills | Status: DC | PRN

## 2023-11-08 MED ORDER — HYOSCYAMINE SULFATE 0.125 MG SL SUBL: 0.2500 mg | SUBLINGUAL_TABLET | SUBLINGUAL | 0 refills | Status: DC | PRN

## 2023-11-08 NOTE — TOC Transition Note (Signed)
Transition of Care (TOC) - CM/SW Discharge Note Donn Pierini RN, BSN Transitions of Care Unit 4E- RN Case Manager See Treatment Team for direct phone #   Patient Details  Name: Kyle Matthews MRN: [id number] Date of Birth: [birth year]  Transition of Care [facility]) CM/SW Contact:  Darrold Span, RN Phone Number: 11/08/2023, 10:22 AM   Clinical Narrative:    Pt stable for transition home today  [facility] order placed to resume services with [facility].   CM notified by Cardiac rehab pt requesting RW for home, order has been placed.   CM in to speak with pt-(in bathroom)- wife present at bedside. Confirmed with wife need for RW- she voiced they have no preference for provider.  Wife also confirmed that [facility] was to provide [facility] prior to pt's readmit- they would like to resume HH w/ [facility].  [facility] liaison notified of discharge for start of care.   Wife to transport home.   Call made to [employer] liaison for DME need- RW to be delivered to room- within an hour per liaison.   No further TOC needs noted.    Final next level of care: Home w Home Health Services Barriers to Discharge: Barriers Resolved   Patient Goals and CMS Choice CMS Medicare.gov Compare Post Acute Care list provided to:: Patient Choice offered to / list presented to : Patient  Discharge Placement                 Home w/ [facility]        Discharge Plan and Services Additional resources added to the After Visit Summary for   In-house Referral: NA Discharge Planning Services: CM Consult Post Acute Care Choice: Home Health          DME Arranged: Walker rolling DME Agency: [facility] Date DME Agency Contacted: 11/08/23 Time DME Agency Contacted: [phone] Representative spoke with at DME Agency: Zollie Beckers HH Arranged: RN HH Agency: [facility] Date [facility] Agency Contacted: 10/25/23 Time HH Agency Contacted: 1545 Representative spoke with at [facility] Agency: Merry Proud  Social Determinants of Health  (SDOH) Interventions SDOH Screenings   Food Insecurity: Food Insecurity Present (10/23/2023)  Housing: Low Risk  (10/23/2023)  Transportation Needs: Unknown (10/23/2023)  Utilities: Not At Risk (10/23/2023)  Tobacco Use: High Risk (11/02/2023)     Readmission Risk Interventions    11/08/2023   10:21 AM 10/25/2023    3:42 PM  Readmission Risk Prevention Plan  Transportation Screening Complete Complete  PCP or Specialist Appt within 3-5 Days  Complete  HRI or Home Care Consult  Complete  Palliative Care Screening  Not Applicable  Medication Review (RN Care Manager)  Complete

## 2023-11-08 NOTE — Progress Notes (Signed)
      [address]       [place] [address]             [phone]      6 Days Post-Op Procedure(s) (LRB): AORTIC VALVE REPLACEMENT (AVR) USING 23 MM INSPIRIS RESILIA AORTIC VALVE (N/A) CORONARY ARTERY BYPASS GRAFTING (CABG) X TWO USING LEFT INTERNAL MAMMARY ARTERY AND RIGHT GREATER SAPHENEOUS VEIN HARVESTED ENDOSCOPICLY (N/A) TRANSESOPHAGEAL ECHOCARDIOGRAM (N/A)  Subjective:  Patient is doing well.  He continues to feel better and is without complaints.  He states he has some questions for Urology and he has not heard about follow up.   Objective: Vital signs in last 24 hours: Temp:  [98.1 F (36.7 C)-98.9 F (37.2 C)] 98.2 F (36.8 C) (11/20 0310) Pulse Rate:  [58-67] 66 (11/20 0310) Cardiac Rhythm: Normal sinus rhythm (11/20 0400) Resp:  [17-22] 20 (11/20 0310) BP: (117-160)/(61-83) 160/77 (11/20 0310) SpO2:  [95 %-99 %] 97 % (11/20 0310) Weight:  [68 kg] 68 kg (11/20 0421)  Intake/Output from previous day: 11/19 0701 - 11/20 0700 In: 1252.3 [P.O.:730; I.V.:522.3] Out: 2250 [Urine:2250]  General appearance: alert, cooperative, and no distress Heart: regular rate and rhythm Lungs: clear to auscultation bilaterally Abdomen: soft, non-tender; bowel sounds normal; no masses,  no organomegaly Extremities: edema trace Wound: clean and dry  Lab Results: Recent Labs    11/06/23 0607 11/07/23 0518  WBC 7.9 8.5  HGB 8.4* 8.6*  HCT 26.3* 27.2*  PLT 222 212   BMET:  Recent Labs    11/06/23 0607  NA 137  K 4.2  CL 106  CO2 25  GLUCOSE 99  BUN 17  CREATININE 0.69  CALCIUM 8.0*    PT/INR: No results for input(s): "LABPROT", "INR" in the last 72 hours. ABG    Component Value Date/Time   PHART 7.326 (L) 11/02/2023 1859   HCO3 22.4 11/02/2023 1859   TCO2 24 11/02/2023 1859   ACIDBASEDEF 3.0 (H) 11/02/2023 1859   O2SAT 97 11/02/2023 1859   CBG (last 3)  Recent Labs    11/05/23 0801 11/05/23 1119  GLUCAP 104* 95     Assessment/Plan: S/P Procedure(s) (LRB): AORTIC VALVE REPLACEMENT (AVR) USING 23 MM INSPIRIS RESILIA AORTIC VALVE (N/A) CORONARY ARTERY BYPASS GRAFTING (CABG) X TWO USING LEFT INTERNAL MAMMARY ARTERY AND RIGHT GREATER SAPHENEOUS VEIN HARVESTED ENDOSCOPICLY (N/A) TRANSESOPHAGEAL ECHOCARDIOGRAM (N/A)  CV- PAF, converting Sinus Bradycardia- continue Amiodarone, HTN- will resume Norvasc Pulm- no acute issues, off oxygen, continue IS Renal- creatinine has been stable, continue Lasix, potassium GU- Massive prostate, hematuria resolved.. will need Prostatectomy, should be able to undergo in 6 weeks.. 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 continue iron Dispo- patient stable, will d/c home today   LOS: 16 days    Lowella Dandy, PA-C 11/08/2023

## 2023-11-08 NOTE — Progress Notes (Signed)
OT Cancellation Note  Patient Details Name: Kyle Matthews MRN: [id number] DOB: Oct 27, 1946   Cancelled Treatment:    Reason Eval/Treat Not Completed: (P) OT screened, no needs identified, will sign off. Pt and wife state they have all DME and support at home needed to remain safe/independent. Pt able to recite all precautions, displays good safety awareness, no further acute OT needs.  [name] 11/08/2023, 11:59 AM

## 2023-11-08 NOTE — Progress Notes (Signed)
Pt's primary RN did discharge instructions and catheter care instruction review with pt and his wife (pt has had a chronic foley cath since August this year per RN.)  Pt's RW has been delivered to the room by [facility], pt's wife has taken to RW with her to the car and volunteers have been requested to take him down for discharge and go by the [facility] to pick up his meds.   Roann Merk,RN SWOT

## 2023-11-08 NOTE — Progress Notes (Signed)
CARDIAC REHAB PHASE I     Post OHS education including site care, restrictions, heart healthy diet, sternal precautions, IS use at home, home needs at discharge, exercise guidelines, smoking cessation and CRP2 reviewed. All questions and concerns addressed. Will refer to AP for CRP2.   [id number] Woodroe Chen, RN BSN 11/08/2023 9:41 AM

## 2023-11-09 MED FILL — Electrolyte-R (PH 7.4) Solution: INTRAVENOUS | Qty: 4000 | Status: AC

## 2023-11-09 MED FILL — Sodium Chloride IV Soln 0.9%: INTRAVENOUS | Qty: 2000 | Status: AC

## 2023-11-09 MED FILL — Sodium Bicarbonate IV Soln 8.4%: INTRAVENOUS | Qty: 50 | Status: AC

## 2023-11-09 MED FILL — Heparin Sodium (Porcine) Inj 1000 Unit/ML: INTRAMUSCULAR | Qty: 30 | Status: AC

## 2023-11-09 MED FILL — Heparin Sodium (Porcine) Inj 1000 Unit/ML: INTRAMUSCULAR | Qty: 10 | Status: AC

## 2023-11-10 DIAGNOSIS — J449 Chronic obstructive pulmonary disease, unspecified: Secondary | ICD-10-CM | POA: Diagnosis not present

## 2023-11-10 DIAGNOSIS — I2510 Atherosclerotic heart disease of native coronary artery without angina pectoris: Secondary | ICD-10-CM | POA: Diagnosis not present

## 2023-11-10 DIAGNOSIS — I214 Non-ST elevation (NSTEMI) myocardial infarction: Secondary | ICD-10-CM | POA: Diagnosis not present

## 2023-11-10 DIAGNOSIS — F17210 Nicotine dependence, cigarettes, uncomplicated: Secondary | ICD-10-CM | POA: Diagnosis not present

## 2023-11-10 DIAGNOSIS — I350 Nonrheumatic aortic (valve) stenosis: Secondary | ICD-10-CM | POA: Diagnosis not present

## 2023-11-10 DIAGNOSIS — I351 Nonrheumatic aortic (valve) insufficiency: Secondary | ICD-10-CM | POA: Diagnosis not present

## 2023-11-10 DIAGNOSIS — Z951 Presence of aortocoronary bypass graft: Secondary | ICD-10-CM | POA: Diagnosis not present

## 2023-11-10 DIAGNOSIS — G8929 Other chronic pain: Secondary | ICD-10-CM | POA: Diagnosis not present

## 2023-11-10 DIAGNOSIS — I5033 Acute on chronic diastolic (congestive) heart failure: Secondary | ICD-10-CM | POA: Diagnosis not present

## 2023-11-10 DIAGNOSIS — Z48812 Encounter for surgical aftercare following surgery on the circulatory system: Secondary | ICD-10-CM | POA: Diagnosis not present

## 2023-11-10 DIAGNOSIS — Z952 Presence of prosthetic heart valve: Secondary | ICD-10-CM | POA: Diagnosis not present

## 2023-11-10 DIAGNOSIS — Z8673 Personal history of transient ischemic attack (TIA), and cerebral infarction without residual deficits: Secondary | ICD-10-CM | POA: Diagnosis not present

## 2023-11-10 DIAGNOSIS — I4510 Unspecified right bundle-branch block: Secondary | ICD-10-CM | POA: Diagnosis not present

## 2023-11-10 DIAGNOSIS — Z466 Encounter for fitting and adjustment of urinary device: Secondary | ICD-10-CM | POA: Diagnosis not present

## 2023-11-10 DIAGNOSIS — Z7982 Long term (current) use of aspirin: Secondary | ICD-10-CM | POA: Diagnosis not present

## 2023-11-10 DIAGNOSIS — K219 Gastro-esophageal reflux disease without esophagitis: Secondary | ICD-10-CM | POA: Diagnosis not present

## 2023-11-10 DIAGNOSIS — E785 Hyperlipidemia, unspecified: Secondary | ICD-10-CM | POA: Diagnosis not present

## 2023-11-10 DIAGNOSIS — N139 Obstructive and reflux uropathy, unspecified: Secondary | ICD-10-CM | POA: Diagnosis not present

## 2023-11-10 DIAGNOSIS — I110 Hypertensive heart disease with heart failure: Secondary | ICD-10-CM | POA: Diagnosis not present

## 2023-11-13 ENCOUNTER — Ambulatory Visit: Payer: Self-pay | Admitting: *Deleted

## 2023-11-13 DIAGNOSIS — Z4802 Encounter for removal of sutures: Secondary | ICD-10-CM

## 2023-11-13 NOTE — Progress Notes (Signed)
Patient arrived for nurse visit to remove sutures post-AVR 11/14 by Dr. Laneta Simmers.  Three sutures removed with no signs or symptoms of infection noted.  Incisions well approximated. Patient tolerated suture removal well.  Patient and family instructed to keep the incision site clean and dry. Patient and family acknowledged instructions given.  All questions answered.

## 2023-11-17 DIAGNOSIS — J449 Chronic obstructive pulmonary disease, unspecified: Secondary | ICD-10-CM | POA: Diagnosis not present

## 2023-11-17 DIAGNOSIS — I5033 Acute on chronic diastolic (congestive) heart failure: Secondary | ICD-10-CM | POA: Diagnosis not present

## 2023-11-17 DIAGNOSIS — E785 Hyperlipidemia, unspecified: Secondary | ICD-10-CM | POA: Diagnosis not present

## 2023-11-17 DIAGNOSIS — F17210 Nicotine dependence, cigarettes, uncomplicated: Secondary | ICD-10-CM | POA: Diagnosis not present

## 2023-11-17 DIAGNOSIS — Z951 Presence of aortocoronary bypass graft: Secondary | ICD-10-CM | POA: Diagnosis not present

## 2023-11-17 DIAGNOSIS — I351 Nonrheumatic aortic (valve) insufficiency: Secondary | ICD-10-CM | POA: Diagnosis not present

## 2023-11-17 DIAGNOSIS — Z7982 Long term (current) use of aspirin: Secondary | ICD-10-CM | POA: Diagnosis not present

## 2023-11-17 DIAGNOSIS — K219 Gastro-esophageal reflux disease without esophagitis: Secondary | ICD-10-CM | POA: Diagnosis not present

## 2023-11-17 DIAGNOSIS — Z8673 Personal history of transient ischemic attack (TIA), and cerebral infarction without residual deficits: Secondary | ICD-10-CM | POA: Diagnosis not present

## 2023-11-17 DIAGNOSIS — N139 Obstructive and reflux uropathy, unspecified: Secondary | ICD-10-CM | POA: Diagnosis not present

## 2023-11-17 DIAGNOSIS — Z466 Encounter for fitting and adjustment of urinary device: Secondary | ICD-10-CM | POA: Diagnosis not present

## 2023-11-17 DIAGNOSIS — I110 Hypertensive heart disease with heart failure: Secondary | ICD-10-CM | POA: Diagnosis not present

## 2023-11-17 DIAGNOSIS — G8929 Other chronic pain: Secondary | ICD-10-CM | POA: Diagnosis not present

## 2023-11-17 DIAGNOSIS — I4510 Unspecified right bundle-branch block: Secondary | ICD-10-CM | POA: Diagnosis not present

## 2023-11-17 DIAGNOSIS — I350 Nonrheumatic aortic (valve) stenosis: Secondary | ICD-10-CM | POA: Diagnosis not present

## 2023-11-17 DIAGNOSIS — Z952 Presence of prosthetic heart valve: Secondary | ICD-10-CM | POA: Diagnosis not present

## 2023-11-17 DIAGNOSIS — I2510 Atherosclerotic heart disease of native coronary artery without angina pectoris: Secondary | ICD-10-CM | POA: Diagnosis not present

## 2023-11-17 DIAGNOSIS — I214 Non-ST elevation (NSTEMI) myocardial infarction: Secondary | ICD-10-CM | POA: Diagnosis not present

## 2023-11-17 DIAGNOSIS — Z48812 Encounter for surgical aftercare following surgery on the circulatory system: Secondary | ICD-10-CM | POA: Diagnosis not present

## 2023-11-22 DIAGNOSIS — R310 Gross hematuria: Secondary | ICD-10-CM | POA: Diagnosis not present

## 2023-11-22 DIAGNOSIS — R338 Other retention of urine: Secondary | ICD-10-CM | POA: Diagnosis not present

## 2023-11-23 ENCOUNTER — Telehealth (HOSPITAL_COMMUNITY): Payer: Self-pay

## 2023-11-23 DIAGNOSIS — Z79899 Other long term (current) drug therapy: Secondary | ICD-10-CM | POA: Diagnosis not present

## 2023-11-23 DIAGNOSIS — D6869 Other thrombophilia: Secondary | ICD-10-CM | POA: Diagnosis not present

## 2023-11-23 DIAGNOSIS — D500 Iron deficiency anemia secondary to blood loss (chronic): Secondary | ICD-10-CM | POA: Diagnosis not present

## 2023-11-23 DIAGNOSIS — Z0000 Encounter for general adult medical examination without abnormal findings: Secondary | ICD-10-CM | POA: Diagnosis not present

## 2023-11-23 DIAGNOSIS — Z23 Encounter for immunization: Secondary | ICD-10-CM | POA: Diagnosis not present

## 2023-11-23 DIAGNOSIS — I480 Paroxysmal atrial fibrillation: Secondary | ICD-10-CM | POA: Diagnosis not present

## 2023-11-23 DIAGNOSIS — Z8673 Personal history of transient ischemic attack (TIA), and cerebral infarction without residual deficits: Secondary | ICD-10-CM | POA: Diagnosis not present

## 2023-11-23 DIAGNOSIS — I5033 Acute on chronic diastolic (congestive) heart failure: Secondary | ICD-10-CM | POA: Diagnosis not present

## 2023-11-23 DIAGNOSIS — Z953 Presence of xenogenic heart valve: Secondary | ICD-10-CM | POA: Diagnosis not present

## 2023-11-23 NOTE — Telephone Encounter (Signed)
Called patient regarding his referral to cardiac rehab. Program was explained and he stated he wanted to ask his cardiologist if this was something he needed before he decided. He said he would call back if he wants to participate. Contact information provided.

## 2023-11-24 ENCOUNTER — Encounter: Payer: Self-pay | Admitting: Nurse Practitioner

## 2023-11-24 ENCOUNTER — Ambulatory Visit: Payer: Medicare Other | Attending: Nurse Practitioner | Admitting: Nurse Practitioner

## 2023-11-24 VITALS — BP 112/62 | HR 62 | Ht 63.0 in | Wt 145.6 lb

## 2023-11-24 DIAGNOSIS — D62 Acute posthemorrhagic anemia: Secondary | ICD-10-CM | POA: Diagnosis not present

## 2023-11-24 DIAGNOSIS — Z8673 Personal history of transient ischemic attack (TIA), and cerebral infarction without residual deficits: Secondary | ICD-10-CM | POA: Diagnosis not present

## 2023-11-24 DIAGNOSIS — I350 Nonrheumatic aortic (valve) stenosis: Secondary | ICD-10-CM | POA: Diagnosis not present

## 2023-11-24 DIAGNOSIS — I10 Essential (primary) hypertension: Secondary | ICD-10-CM | POA: Diagnosis not present

## 2023-11-24 DIAGNOSIS — N320 Bladder-neck obstruction: Secondary | ICD-10-CM

## 2023-11-24 DIAGNOSIS — I351 Nonrheumatic aortic (valve) insufficiency: Secondary | ICD-10-CM

## 2023-11-24 DIAGNOSIS — E785 Hyperlipidemia, unspecified: Secondary | ICD-10-CM | POA: Diagnosis not present

## 2023-11-24 DIAGNOSIS — I2510 Atherosclerotic heart disease of native coronary artery without angina pectoris: Secondary | ICD-10-CM | POA: Diagnosis not present

## 2023-11-24 NOTE — Patient Instructions (Signed)
Medication Instructions:  Your physician recommends that you continue on your current medications as directed. Please refer to the Current Medication list given to you today.  *If you need a refill on your cardiac medications before your next appointment, please call your pharmacy*   Lab Work: NONE ordered at this time of appointment   Testing/Procedures: NONE ordered at this time of appointment   Follow-Up: At [facility], you and your health needs are our priority.  As part of our continuing mission to provide you with exceptional heart care, we have created designated Provider Care Teams.  These Care Teams include your primary Cardiologist (physician) and Advanced Practice Providers (APPs -  Physician Assistants and Nurse Practitioners) who all work together to provide you with the care you need, when you need it.  We recommend signing up for the patient portal called "MyChart".  Sign up information is provided on this After Visit Summary.  MyChart is used to connect with patients for Virtual Visits (Telemedicine).  Patients are able to view lab/test results, encounter notes, upcoming appointments, etc.  Non-urgent messages can be sent to your provider as well.   To learn more about what you can do with MyChart, go to [email or web address].    Your next appointment:    Keep 01/2024 apt   Provider:   Dr. Lynnette Caffey

## 2023-11-24 NOTE — Progress Notes (Unsigned)
Office Visit    Patient Name: Kyle Matthews Date of Encounter: 11/24/2023  Primary Care Provider:  Mila Palmer, MD Primary Cardiologist:  Maisie Fus, MD  Chief Complaint    77 year old male with a history of CAD s/p CABG x 2, severe aortic stenosis, moderate to severe aortic valve insufficiency s/p aortic valve replacement in 10/2023, hypertension, hyperlipidemia, remote CVA, BPH bladder outlet obstruction, COPD, tobacco use, and anemia who presents for hospital follow-up related to CAD and valvular heart disease s/p CABG x 2, AVR.  Past Medical History    Past Medical History:  Diagnosis Date   BPH (benign prostatic hyperplasia)    Status post biopsy in 2009. 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   Stroke (HCC) 2007   No true etiology noted   Past Surgical History:  Procedure Laterality Date   AORTIC VALVE REPLACEMENT N/A 11/02/2023   Procedure: AORTIC VALVE REPLACEMENT (AVR) USING 23 MM INSPIRIS RESILIA AORTIC VALVE;  Surgeon: Alleen Borne, MD;  Location: [facility] OR;  Service: Open Heart Surgery;  Laterality: N/A;   CORONARY ARTERY BYPASS GRAFT N/A 11/02/2023   Procedure: CORONARY ARTERY BYPASS GRAFTING (CABG) X TWO USING LEFT INTERNAL MAMMARY ARTERY AND RIGHT GREATER SAPHENEOUS VEIN HARVESTED ENDOSCOPICLY;  Surgeon: Alleen Borne, MD;  Location: [facility] OR;  Service: Open Heart Surgery;  Laterality: N/A;   CORONARY PRESSURE/FFR STUDY N/A 10/09/2023   Procedure: CORONARY PRESSURE/FFR STUDY;  Surgeon: [name], MD;  Location: [facility] INVASIVE CV LAB;  Service: Cardiovascular;  Laterality: N/A;   IR ANGIOGRAM PELVIS SELECTIVE OR SUPRASELECTIVE  10/27/2023   IR ANGIOGRAM PELVIS SELECTIVE OR SUPRASELECTIVE  10/27/2023   IR ANGIOGRAM SELECTIVE EACH ADDITIONAL VESSEL  10/27/2023   IR ANGIOGRAM SELECTIVE EACH  ADDITIONAL VESSEL  10/27/2023   IR ANGIOGRAM SELECTIVE EACH ADDITIONAL VESSEL  10/27/2023   IR ANGIOGRAM SELECTIVE EACH ADDITIONAL VESSEL  10/27/2023   IR EMBO ARTERIAL NOT HEMORR HEMANG INC GUIDE ROADMAPPING  10/27/2023   IR US GUIDE VASC ACCESS RIGHT  10/27/2023   RIGHT/LEFT HEART CATH AND CORONARY ANGIOGRAPHY N/A 10/09/2023   Procedure: RIGHT/LEFT HEART CATH AND CORONARY ANGIOGRAPHY;  Surgeon: [name], MD;  Location: [facility];  Service: Cardiovascular;  Laterality: N/A;   TEE WITHOUT CARDIOVERSION N/A 11/02/2023   Procedure: TRANSESOPHAGEAL ECHOCARDIOGRAM;  Surgeon: Alleen Borne, MD;  Location: [facility] OR;  Service: Open Heart Surgery;  Laterality: N/A;   TRANSTHORACIC ECHOCARDIOGRAM  04/28/2017    EF 65-70%. GR 1 DD. No regional wall motion abnormality. Moderate aortic stenosis with moderate regurgitation. Mean gradient 34 mmHg.    Allergies  Allergies  Allergen Reactions   Tetanus Toxoids     Childhood Allergy      Labs/Other Studies Reviewed    The following studies were reviewed today:  Cardiac Studies & Procedures   CARDIAC CATHETERIZATION  CARDIAC CATHETERIZATION 10/09/2023  Narrative   Ost LM lesion is 50% stenosed.  1.  Moderate left main stenosis with an RFR of 0.83. 2.  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 3.  Fick cardiac output of 6.5 L/min and Fick cardiac index of 3.8 L/min/m with the following hemodynamics: Right atrial pressure mean of 7 Right ventricular pressure 42/-2 with an end-diastolic pressure of 8 mmHg Wedge pressure mean of 14  mmHg with V waves to 23 mmHg PA pressure of 38/16 with a mean of 24 mmHg PVR of 1.5 Woods units PA pulsatility index of 3.1  Recommendation: Continue evaluation for aortic valve intervention.  Will obtain TAVR protocol CTA tomorrow.  Findings Coronary Findings Diagnostic  Dominance: Right  Left Main Ost LM lesion is 50% stenosed.  Left Anterior Descending The vessel exhibits minimal  luminal irregularities.  Ramus Intermedius There is mild diffuse disease throughout the vessel.  Right Coronary Artery There is moderate diffuse disease throughout the vessel.  Intervention  No interventions have been documented.     ECHOCARDIOGRAM  ECHOCARDIOGRAM COMPLETE 10/03/2023  Narrative ECHOCARDIOGRAM REPORT    Patient Name:   Kyle Matthews Date of Exam: 10/03/2023 Medical Rec #:  [id number]  Height:       68.0 in Accession #:    [id number] Weight:       152.6 lb Date of Birth:  09/26/1946   BSA:          1.822 m Patient Age:    77 years   BP:           111/68 mmHg Patient Gender: M          HR:           94 bpm. Exam Location:  [facility]  Procedure: 2D Echo, Cardiac Doppler and Color Doppler  Indications:    SBE  History:        Patient has prior history of Echocardiogram examinations, most recent 05/02/2018. Acute MI, Arrythmias:RBBB, Signs/Symptoms:Chest Pain and Shortness of Breath; Risk Factors:Hypertension, Dyslipidemia and Current Smoker.  Sonographer:    Mikki Harbor Referring Phys: [id number] SHERI HAMMOCK  IMPRESSIONS   1. Left ventricular ejection fraction, by estimation, is 55 to 60%. The left ventricle has normal function. The left ventricle has no regional wall motion abnormalities. There is mild left ventricular hypertrophy. Left ventricular diastolic parameters are indeterminate. Elevated left atrial pressure. 2. Right ventricular systolic function is normal. The right ventricular size is normal. There is mildly elevated pulmonary artery systolic pressure. 3. Left atrial size was moderately dilated. 4. Right atrial size was moderately dilated. 5. The mitral valve is abnormal. Mild to moderate mitral valve regurgitation. No evidence of mitral stenosis. 6. The tricuspid valve is abnormal. 7. The aortic valve was not well visualized. There is severe calcifcation of the aortic valve. There is severe thickening of the aortic valve. Aortic valve  regurgitation is moderate to severe. Severe aortic valve stenosis. Severe aortic stenosis is present. Aortic valve mean gradient measures 42.0 mmHg. Aortic valve peak gradient measures 69.2 mmHg. Aortic valve area, by VTI measures 0.89 cm. 8. The inferior vena cava is dilated in size with >50% respiratory variability, suggesting right atrial pressure of 8 mmHg.  FINDINGS Left Ventricle: Left ventricular ejection fraction, by estimation, is 55 to 60%. The left ventricle has normal function. The left ventricle has no regional wall motion abnormalities. The left ventricular internal cavity size was normal in size. There is mild left ventricular hypertrophy. Left ventricular diastolic parameters are indeterminate. Elevated left atrial pressure.  Right Ventricle: The right ventricular size is normal. Right vetricular wall thickness was not well visualized. Right ventricular systolic function is normal. There is mildly elevated pulmonary artery systolic pressure. The tricuspid regurgitant velocity is 3.02 m/s, and with an assumed right atrial pressure of 8 mmHg, the estimated right ventricular systolic pressure is 44.5 mmHg.  Left Atrium: Left atrial size was moderately dilated.  Right Atrium:  Right atrial size was moderately dilated.  Pericardium: There is no evidence of pericardial effusion.  Mitral Valve: The mitral valve is abnormal. Mild mitral annular calcification. Mild to moderate mitral valve regurgitation. No evidence of mitral valve stenosis. MV peak gradient, 11.4 mmHg. The mean mitral valve gradient is 6.0 mmHg.  Tricuspid Valve: The tricuspid valve is abnormal. Tricuspid valve regurgitation is mild . No evidence of tricuspid stenosis.  Aortic Valve: The aortic valve was not well visualized. There is severe calcifcation of the aortic valve. There is severe thickening of the aortic valve. There is severe aortic valve annular calcification. Aortic valve regurgitation is moderate  to severe. Aortic regurgitation PHT measures 227 msec. Severe aortic stenosis is present. Aortic valve mean gradient measures 42.0 mmHg. Aortic valve peak gradient measures 69.2 mmHg. Aortic valve area, by VTI measures 0.89 cm.  Pulmonic Valve: The pulmonic valve was not well visualized. Pulmonic valve regurgitation is not visualized. No evidence of pulmonic stenosis.  Aorta: The aortic root and ascending aorta are structurally normal, with no evidence of dilitation.  Venous: The inferior vena cava is dilated in size with greater than 50% respiratory variability, suggesting right atrial pressure of 8 mmHg.  IAS/Shunts: No atrial level shunt detected by color flow Doppler.   LEFT VENTRICLE PLAX 2D LVIDd:         4.80 cm      Diastology LVIDs:         3.20 cm      LV e' medial:    6.96 cm/s LV PW:         1.10 cm      LV E/e' medial:  18.8 LV IVS:        1.10 cm      LV e' lateral:   9.25 cm/s LVOT diam:     1.90 cm      LV E/e' lateral: 14.2 LV SV:         91 LV SV Index:   50 LVOT Area:     2.84 cm  LV Volumes (MOD) LV vol d, MOD A2C: 111.0 ml LV vol d, MOD A4C: 141.0 ml LV vol s, MOD A2C: 58.4 ml LV vol s, MOD A4C: 43.7 ml LV SV MOD A2C:     52.6 ml LV SV MOD A4C:     141.0 ml LV SV MOD BP:      76.4 ml  RIGHT VENTRICLE RV Basal diam:  3.80 cm RV Mid diam:    3.20 cm RV S prime:     15.30 cm/s TAPSE (M-mode): 2.2 cm  LEFT ATRIUM             Index        RIGHT ATRIUM           Index LA diam:        3.80 cm 2.09 cm/m   RA Area:     21.80 cm LA Vol (A2C):   93.7 ml 51.44 ml/m  RA Volume:   76.90 ml  42.22 ml/m LA Vol (A4C):   75.9 ml 41.67 ml/m LA Biplane Vol: 84.6 ml 46.44 ml/m AORTIC VALVE                     PULMONIC VALVE AV Area (Vmax):    0.93 cm      PV Vmax:       0.88 m/s AV Area (Vmean):   0.95 cm      PV Peak grad:  3.1 mmHg AV Area (VTI):     0.89 cm AV Vmax:           416.00 cm/s AV Vmean:          286.667 cm/s AV VTI:            1.020 m AV Peak  Grad:      69.2 mmHg AV Mean Grad:      42.0 mmHg LVOT Vmax:         137.00 cm/s LVOT Vmean:        95.600 cm/s LVOT VTI:          0.320 m LVOT/AV VTI ratio: 0.31 AI PHT:            227 msec  AORTA Ao Root diam: 3.30 cm Ao Asc diam:  3.40 cm  MITRAL VALVE                TRICUSPID VALVE MV Area (PHT): 3.63 cm     TR Peak grad:   36.5 mmHg MV Peak grad:  11.4 mmHg    TR Vmax:        302.00 cm/s MV Mean grad:  6.0 mmHg MV Vmax:       1.68 m/s     SHUNTS MV Vmean:      111.0 cm/s   Systemic VTI:  0.32 m MV Decel Time: 209 msec     Systemic Diam: 1.90 cm MV E velocity: 131.00 cm/s MV A velocity: 130.00 cm/s MV E/A ratio:  1.01  Dina Rich MD Electronically signed by Dina Rich MD Signature Date/Time: 10/03/2023/12:33:39 PM    Final   TEE  ECHO INTRAOPERATIVE TEE 11/02/2023  Narrative *INTRAOPERATIVE TRANSESOPHAGEAL REPORT *    Patient Name:   Kyle Matthews     Date of Exam: 11/02/2023 Medical Rec #:  [id number]      Height:       63.0 in Accession #:    [id number]     Weight:       143.3 lb Date of Birth:  05/16/1946       BSA:          1.68 m Patient Age:    77 years       BP:           127/67 mmHg Patient Gender: M              HR:           78 bpm. Exam Location:  [facility]iology  Transesophogeal exam was perform intraoperatively during surgical procedure. Patient was closely monitored under general anesthesia during the entirety of examination.  Indications:     CAD Native Vessel i25.10 Aortic Stenosis i35.0 Sonographer:     Irving Burton Senior RDCS Performing Phys: [id number] Alleen Borne Diagnosing Phys: Achille Rich MD  Complications: No known complications during this procedure. POST-OP IMPRESSIONS _ Left Ventricle: The left ventricle is unchanged from pre-bypass. _ Aortic Valve: No stenosis present. A bioprosthetic valve was placed, leaflets are freely mobile and leaflets thin. Manufactured by; [employer] Res[employer] Size; 23mm. There is a mean PG 9 mmHg across  the prosthetic aortic valve.  PRE-OP FINDINGS Left Ventricle: The left ventricle has low normal systolic function, with an ejection fraction of 50-55%. The cavity size was mildly dilated. There is mildly increased left ventricular wall thickness. There is mild concentric left ventricular hypertrophy.   Right Ventricle: The right ventricle has normal systolic function. The cavity was normal. There is no  increase in right ventricular wall thickness.  Left Atrium: Left atrial size was dilated. No left atrial/left atrial appendage thrombus was detected.  Right Atrium: Right atrial size was dilated.  Interatrial Septum: No atrial level shunt detected by color flow Doppler.  Pericardium: There is no evidence of pericardial effusion. There is a moderate pleural effusion in the left lateral region.  Mitral Valve: The mitral valve is normal in structure. Mitral valve regurgitation is mild by color flow Doppler. There is No evidence of mitral stenosis.  Tricuspid Valve: The tricuspid valve was normal in structure. Tricuspid valve regurgitation is mild by color flow Doppler. No evidence of tricuspid stenosis is present.  Aortic Valve: The aortic valve is tricuspid Aortic valve regurgitation is severe by color flow Doppler. The jet is centrally-directed. There is severe stenosis of the aortic valve, with a calculated valve area of 0.82 cm. There is no evidence of aortic valve vegetation. There is severe calcifcation present on the aortic valve right coronary, left coronary and non-coronary cusps with severely decreased mobility.  Pulmonic Valve: The pulmonic valve was normal in structure. Pulmonic valve regurgitation is not visualized by color flow Doppler.   Aorta: The aortic root, ascending aorta and aortic arch are normal in size and structure.  +--------------+--------++ LEFT VENTRICLE         +--------------+--------++ PLAX 2D                +--------------+--------++ LVOT  diam:    2.10 cm  +--------------+--------++ LVOT Area:    3.46 cm +--------------+--------++                        +--------------+--------++  +------------------+------------++ AORTIC VALVE                   +------------------+------------++ AV Area (Vmax):   0.75 cm     +------------------+------------++ AV Area (Vmean):  0.68 cm     +------------------+------------++ AV Area (VTI):    0.82 cm     +------------------+------------++ AV Vmax:          443.00 cm/s  +------------------+------------++ AV Vmean:         345.000 cm/s +------------------+------------++ AV VTI:           1.070 m      +------------------+------------++ AV Peak Grad:     78.5 mmHg    +------------------+------------++ AV Mean Grad:     51.0 mmHg    +------------------+------------++ LVOT Vmax:        96.20 cm/s   +------------------+------------++ LVOT Vmean:       67.900 cm/s  +------------------+------------++ LVOT VTI:         0.252 m      +------------------+------------++ LVOT/AV VTI ratio:0.24         +------------------+------------++ AR PHT:           278 msec     +------------------+------------++  +-------------+-------++ AORTA                +-------------+-------++ Ao Root diam:2.70 cm +-------------+-------++   +--------------+-------+ SHUNTS                +--------------+-------+ Systemic VTI: 0.25 m  +--------------+-------+ Systemic Diam:2.10 cm +--------------+-------+   Achille Rich MD Electronically signed by Achille Rich MD Signature Date/Time: 11/05/2023/7:53:58 AM    Final   MONITORS  LONG TERM MONITOR (3-14 DAYS) 08/26/2020   CT SCANS  CT CORONARY MORPH W/CTA COR W/SCORE 10/10/2023  Addendum 10/10/2023  4:16 PM ADDENDUM REPORT:  10/10/2023 16:14  EXAM: OVER-READ INTERPRETATION  CT CHEST  The following report is an over-read performed by radiologist  Dr. Jacob Moores [facility] on 10/10/2023. This over-read does not include interpretation of cardiac or coronary anatomy or pathology. The cardiac TAVR interpretation by the cardiologist is attached.  COMPARISON:  None.  FINDINGS: Extracardiac findings will be described separately under dictation for contemporaneously obtained CTA chest, abdomen and pelvis.  IMPRESSION: Please see separate dictation for contemporaneously obtained CTA chest, abdomen and pelvis dated 10/10/2023 for full description of relevant extracardiac findings.   Electronically Signed By: Allegra Lai M.D. On: 10/10/2023 16:14  Narrative CLINICAL DATA:  67M with severe aortic stenosis being evaluated for a TAVR procedure.  EXAM: Cardiac TAVR CT  TECHNIQUE: The patient was scanned on a [employer]. A 120 kV retrospective scan was triggered in the descending thoracic aorta at 111 HU's. Gantry rotation speed was 250 msecs and collimation was .6 mm. No beta blockade or nitro were given. The 3D data set was reconstructed in 5% intervals of the R-R cycle. Systolic and diastolic phases were analyzed on a dedicated work station using MPR, MIP and VRT modes. The patient received 100 cc of contrast.  FINDINGS: Aortic Root:  Aortic valve: Trileaflet  Aortic valve calcium score: 3834  Aortic annulus:  Diameter: 29mm x 21mm  Perimeter: 78mm  Area: 454 mm^2  Calcifications: Mild calcification adjacent to LCC  Coronary height: Min Left - 16mm; Min Right - 24mm  Sinotubular height: Left cusp - 26mm; Right cusp - 28mm; Noncoronary cusp - 25mm  LVOT (as measured 3 mm below the annulus):  Diameter: 29mm x 21mm  Area: 440 mm^2  Calcifications: No calcifications  Aortic sinus width: Left cusp - 32mm; Right cusp - 32mm; Noncoronary cusp - 34mm  Sinotubular junction width: 29mm x 27mm  Optimum Fluoroscopic Angle for Delivery: RAO 1 CAU 11  Cardiac:  Right  atrium: Mild enlargement  Right ventricle: Normal size  Pulmonary arteries: Normal size  Pulmonary veins: Normal configuration  Left atrium: Normal size  Left ventricle: Normal size  Pericardium: Normal thickness  Coronary arteries: Calcium score 2379 (91st percentile)  IMPRESSION: 1. Trileaflet aortic valve with severe calcifications (AV calcium score 3834)  2. Aortic annulus measures 29mm x 21mm in diameter with perimeter 78mm and area 454 mm^2. Mild annular calcifications adjacent to left coronary cusp. Annular measurements are suitable for placement of a 26mm Edwards Sapien 3 valve.  3.  Sufficient coronary to annulus distance.  4.  Optimum Fluoroscopic Angle for Delivery:  RAO 1 CAU 11  5.  Coronary calcium score 2379 (91st percentile)  Electronically Signed: By: Epifanio Lesches M.D. On: 10/10/2023 15:38         Recent Labs: 10/04/2023: B Natriuretic Peptide 501.3 11/02/2023: ALT 71 11/03/2023: Magnesium 3.1 11/06/2023: BUN 17; Creatinine, Ser 0.69; Potassium 4.2; Sodium 137 11/07/2023: Hemoglobin 8.6; Platelets 212  Recent Lipid Panel    Component Value Date/Time   CHOL 122 10/23/2023 0129   TRIG 74 10/23/2023 0129   HDL 52 10/23/2023 0129   CHOLHDL 2.3 10/23/2023 0129   VLDL 15 10/23/2023 0129   LDLCALC 55 10/23/2023 0129    History of Present Illness    77 year old male with the above past medical history including CAD s/p CABG x 2, severe aortic stenosis, moderate to severe aortic valve insufficiency s/p aortic valve replacement in 10/2023, hypertension, hyperlipidemia, remote CVA, BPH bladder outlet obstruction, COPD, tobacco use, and anemia.  He has  a history of prior CVA, TIA. Also with severe BPH with chronic bladder outlet obstruction requiring chronic indwelling Foley catheter.  Echocardiogram in 09/2019 showed EF 65 to 70%, mild MR, severe aortic stenosis, mean gradient 40 mmHg, moderate AI.  He was hospitalized in October 2024 in the  setting of anemia secondary to hematuria, acute heart failure.  He underwent R/LHC which showed 50% ostial left main lesion, moderate diffuse disease of the RCA.  Cardiac gated CTA revealed anatomical characteristics consistent with aortic stenosis suitable for treatment by transcatheter aortic valve replacement. He was last seen in the office on 10/18/2023 and was stable overall from a cardiac standpoint.  It was felt that he was a poor candidate for PCI plus TAVR strategy due to hematuria on DAPT.  Therefore, he was referred to CT surgery for consideration of CABG/AVR.  He was started on Lasix 20 mg daily as needed for lower extremity edema.  Carvedilol was discontinued in the setting of hypotension.  Noted to the ED on 10/23/2023 with complaints of chest pain.  EKG showed diffuse ST depression and elevation in aVR.  Hemoglobin was 5.4 in the setting of gross hematuria.  He underwent CABG x 2 (LIMA-LAD, SVG-RI), and aortic valve replacement with a 23 mm Edwards Resilia bioprosthetic valve on 11/02/2023.  He was diuresed with IV Lasix.  He developed atrial fibrillation with RVR and was treated with amiodarone.  He converted to normal sinus rhythm.  He was discharged home in  stable condition on 11/08/2023. Post-op echocardiogram was scheduled for 12/14/2023.  He presents today for follow-up.  Since his hospitalization accompanied by his wife.  Done well from a cardiac standpoint.  He denies any symptoms concerning for angina.  He has mild lower extremity edema, right greater than left, improved with as needed Lasix.  He denies any palpitations, dizziness, dyspnea, edema, PND, orthopnea, weight gain.  Overall, he reports feeling very well.  He has quit smoking.  He states he is not interested in cardiac rehab at this time.  He does note that he is pending prostate surgery and will need to have this addressed likely at his follow-up visit.  Follow-up as scheduled with CT surgery, follow-up scheduled Dr. Lynnette Caffey.   Now, continue regimen.  Consider need for monitor to screen for recurrent atrial fibrillation.  Will defer for now.  CAD: Severe aortic stenosis/aortic insufficiency: Hypertension: Hyperlipidemia: History of CVA: BPH/bladder outlet obstruction: COPD/tobacco use: Anemia: Disposition:  Home Medications    Current Outpatient Medications  Medication Sig Dispense Refill   amiodarone (PACERONE) 200 MG tablet Take 2 tablets (400 mg total) by mouth 2 (two) times daily. X 5 days, then decrease to 200 mg BID x 7 days, then decrease to 200 mg daily 90 tablet 1   amLODipine (NORVASC) 5 MG tablet Take 1 tablet (5 mg total) by mouth daily. 30 tablet 0   aspirin EC 81 MG tablet Take 81 mg by mouth daily. Swallow whole.     Fe Fum-Vit C-Vit B12-FA (TRIGELS-F FORTE) CAPS capsule Take 1 capsule by mouth daily. 30 capsule 3   finasteride (PROSCAR) 5 MG tablet Take 5 mg by mouth every evening.     furosemide (LASIX) 20 MG tablet Take 1 tablet (20 mg total) by mouth daily as needed. 30 tablet 3   hyoscyamine (LEVSIN SL) 0.125 MG SL tablet Place 2 tablets (0.25 mg total) under the tongue every 4 (four) hours as needed (Bladder spasms). 30 tablet 0   nitroGLYCERIN (NITROSTAT) 0.4 MG  SL tablet Place 1 tablet (0.4 mg total) under the tongue every 5 (five) minutes as needed for chest pain. 25 tablet 3   oxyCODONE (OXY IR/ROXICODONE) 5 MG immediate release tablet Take 1 tablet (5 mg total) by mouth every 4 (four) hours as needed for severe pain (pain score 7-10). 30 tablet 0   pantoprazole (PROTONIX) 40 MG tablet Take 1 tablet (40 mg total) by mouth daily. 30 tablet 11   rosuvastatin (CRESTOR) 40 MG tablet Take 40 mg by mouth daily.     ondansetron (ZOFRAN-ODT) 4 MG disintegrating tablet Take 1 tablet (4 mg total) by mouth every 8 (eight) hours as needed for nausea or vomiting. (Patient not taking: Reported on 11/24/2023) 12 tablet 0   No current facility-administered medications for this visit.     Review of  Systems    ***.  All other systems reviewed and are otherwise negative except as noted above.    Physical Exam    VS:  BP 112/62 (BP Location: Left Arm, Patient Position: Sitting, Cuff Size: Normal)   Pulse 62   Ht 5\' 3"  (1.6 m)   Wt 145 lb 9.6 oz (66 kg)   SpO2 100%   BMI 25.79 kg/m   GEN: Well nourished, well developed, in no acute distress. HEENT: normal. Neck: Supple, no JVD, carotid bruits, or masses. Cardiac: RRR, no murmurs, rubs, or gallops. No clubbing, cyanosis, edema.  Radials/DP/PT 2+ and equal bilaterally.  Respiratory:  Respirations regular and unlabored, clear to auscultation bilaterally. GI: Soft, nontender, nondistended, BS + x 4. MS: no deformity or atrophy. Skin: warm and dry, no rash. Neuro:  Strength and sensation are intact. Psych: Normal affect.  Accessory Clinical Findings    ECG personally reviewed by me today - EKG Interpretation Date/Time:  Friday November 24 2023 11:06:24 EST Ventricular Rate:  62 PR Interval:  204 QRS Duration:  158 QT Interval:  502 QTC Calculation: 509 R Axis:   -74  Text Interpretation: Normal sinus rhythm Left axis deviation Right bundle branch block Minimal voltage criteria for LVH, may be normal variant ( R in aVL ) When compared with ECG of 03-Nov-2023 06:41, Nonspecific T wave abnormality Confirmed by Bernadene Person ([id number]) on 11/24/2023 11:21:19 AM  - no acute changes.   Lab Results  Component Value Date   WBC 8.5 11/07/2023   HGB 8.6 (L) 11/07/2023   HCT 27.2 (L) 11/07/2023   MCV 87.7 11/07/2023   PLT 212 11/07/2023   Lab Results  Component Value Date   CREATININE 0.69 11/06/2023   BUN 17 11/06/2023   NA 137 11/06/2023   K 4.2 11/06/2023   CL 106 11/06/2023   CO2 25 11/06/2023   Lab Results  Component Value Date   ALT 71 (H) 11/02/2023   AST 67 (H) 11/02/2023   ALKPHOS 69 11/02/2023   BILITOT 0.4 11/02/2023   Lab Results  Component Value Date   CHOL 122 10/23/2023   HDL 52 10/23/2023   LDLCALC 55  10/23/2023   TRIG 74 10/23/2023   CHOLHDL 2.3 10/23/2023    Lab Results  Component Value Date   HGBA1C 5.2 11/02/2023    Assessment & Plan    1.  ***      Joylene Grapes, NP 11/24/2023, 11:32 AM

## 2023-11-25 ENCOUNTER — Encounter: Payer: Self-pay | Admitting: Nurse Practitioner

## 2023-11-25 LAB — LAB REPORT - SCANNED: EGFR: 90

## 2023-11-27 ENCOUNTER — Other Ambulatory Visit (HOSPITAL_COMMUNITY): Payer: Self-pay

## 2023-11-28 ENCOUNTER — Other Ambulatory Visit (HOSPITAL_COMMUNITY): Payer: Self-pay

## 2023-12-04 ENCOUNTER — Other Ambulatory Visit: Payer: Self-pay | Admitting: Surgery

## 2023-12-04 DIAGNOSIS — I25110 Atherosclerotic heart disease of native coronary artery with unstable angina pectoris: Secondary | ICD-10-CM

## 2023-12-05 ENCOUNTER — Ambulatory Visit (INDEPENDENT_AMBULATORY_CARE_PROVIDER_SITE_OTHER): Payer: Self-pay | Admitting: Surgical

## 2023-12-05 ENCOUNTER — Ambulatory Visit
Admission: RE | Admit: 2023-12-05 | Discharge: 2023-12-05 | Disposition: A | Discharge status: Home / Self Care | Payer: Medicare Other | Source: Ambulatory Visit | Attending: Surgery | Admitting: Surgery

## 2023-12-05 VITALS — BP 135/74 | HR 70 | Resp 20 | Ht 63.0 in | Wt 151.2 lb

## 2023-12-05 DIAGNOSIS — Z951 Presence of aortocoronary bypass graft: Secondary | ICD-10-CM

## 2023-12-05 DIAGNOSIS — Z952 Presence of prosthetic heart valve: Secondary | ICD-10-CM

## 2023-12-05 DIAGNOSIS — J90 Pleural effusion, not elsewhere classified: Secondary | ICD-10-CM | POA: Diagnosis not present

## 2023-12-05 DIAGNOSIS — I25110 Atherosclerotic heart disease of native coronary artery with unstable angina pectoris: Secondary | ICD-10-CM

## 2023-12-05 NOTE — Patient Instructions (Signed)
Routine activity postsurgical progression

## 2023-12-05 NOTE — Progress Notes (Signed)
[address]       [place] [address]             [phone]      Kyle Matthews [facility] Medical Record #[id number] Date of Birth: Jun 29, 1946  Referring: Antoine Poche, MD Primary Care: Mila Palmer, MD Primary Cardiologist: Maisie Fus, MD   Chief Complaint:   POST OP FOLLOW UP  11/02/2023   Surgeon:  Alleen Borne, MD   First Assistant: Lowella Dandy,  PA-C:   An experienced assistant was required given the complexity of this surgery and the standard of surgical care. The assistant was needed for endoscopic vein harvest, exposure, dissection, suctioning, retraction of delicate tissues and sutures, instrument exchange and for overall help during this procedure.     Preoperative Diagnosis:  Left main coronary artery disease, Severe aortic stenosis and aortic insufficiency.     Postoperative Diagnosis:  Same     Procedure:   Median Sternotomy Extracorporeal circulation 3.   Coronary artery bypass grafting x 2   Left internal mammary artery graft to the LAD SVG to Ramus     4.   Endoscopic vein harvest from the right leg 5.   Aortic valve replacement using a 23 mm Edwards INSPIRIS RESILIA pericardial valve.     Anesthesia:  General Endotracheal   History of Present Illness:   Patient is a 77 year old male status post the above described procedures in the office today's date and routine postsurgical follow-up.  Overall he is doing well.  He has seen cardiology and they describe his progress is good.  He is still pending urologic surgery for prostate and continues to have a Foley catheter.  In terms of surgical recovery he is continuing to make good progress.  He is not having any palpitations.  He is not having significant shortness of breath or chest pain and has had no difficulty with his incisions.  He is not having any fevers, chills or other significant constitutional symptoms.  He does have some lower extremity edema but states this  is improving over time.      Past Medical History:  Diagnosis Date   BPH (benign prostatic hyperplasia)    Status post biopsy in 2009. 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   Stroke (HCC) 2007   No true etiology noted     Social History   Tobacco Use  Smoking Status Former   Current packs/day: 0.00   Average packs/day: 1.5 packs/day for 24.0 years (36.0 ttl pk-yrs)   Types: Cigarettes   Quit date: 08/21/2023   Years since quitting: 0.2  Smokeless Tobacco Never    Social History   Substance and Sexual Activity  Alcohol Use Yes   Alcohol/week: 6.0 standard drinks of alcohol   Types: 6 Cans of beer per week     Allergies  Allergen Reactions   Tetanus Toxoids     Childhood Allergy     Current Outpatient Medications  Medication Sig Dispense Refill   amiodarone (PACERONE) 200 MG tablet Take 2 tablets (400 mg total) by mouth 2 (two) times daily. X 5 days, then decrease to 200 mg BID x 7 days, then decrease to 200 mg daily 90 tablet 1   amLODipine (NORVASC) 5 MG tablet  Take 1 tablet (5 mg total) by mouth daily. 30 tablet 0   aspirin EC 81 MG tablet Take 81 mg by mouth daily. Swallow whole.     Fe Fum-Vit C-Vit B12-FA (TRIGELS-F FORTE) CAPS capsule Take 1 capsule by mouth daily. 30 capsule 3   finasteride (PROSCAR) 5 MG tablet Take 5 mg by mouth every evening.     furosemide (LASIX) 20 MG tablet Take 1 tablet (20 mg total) by mouth daily as needed. 30 tablet 3   hyoscyamine (LEVSIN SL) 0.125 MG SL tablet Place 2 tablets (0.25 mg total) under the tongue every 4 (four) hours as needed (Bladder spasms). 30 tablet 0   nitroGLYCERIN (NITROSTAT) 0.4 MG SL tablet Place 1 tablet (0.4 mg total) under the tongue every 5 (five) minutes as needed for chest pain. 25 tablet 3   ondansetron (ZOFRAN-ODT) 4 MG  disintegrating tablet Take 1 tablet (4 mg total) by mouth every 8 (eight) hours as needed for nausea or vomiting. 12 tablet 0   oxyCODONE (OXY IR/ROXICODONE) 5 MG immediate release tablet Take 1 tablet (5 mg total) by mouth every 4 (four) hours as needed for severe pain (pain score 7-10). 30 tablet 0   pantoprazole (PROTONIX) 40 MG tablet Take 1 tablet (40 mg total) by mouth daily. 30 tablet 11   rosuvastatin (CRESTOR) 40 MG tablet Take 40 mg by mouth daily.     No current facility-administered medications for this visit.       Physical Exam: BP 135/74 (BP Location: Right Arm, Patient Position: Sitting, Cuff Size: Normal)   Pulse 70   Resp 20   Ht 5\' 3"  (1.6 m)   Wt 151 lb 3.2 oz (68.6 kg)   SpO2 94% Comment: RA  BMI 26.78 kg/m   General appearance: alert, cooperative, and no distress Heart: regular rate and rhythm Lungs: clear to auscultation bilaterally Abdomen: Benign Extremities: Bilateral lower extremity edema Wound: Incisions well-healed without evidence of infection No murmur  Diagnostic Studies & Laboratory data:     Recent Radiology Findings:   No results found.    Recent Lab Findings: Lab Results  Component Value Date   WBC 8.5 11/07/2023   HGB 8.6 (L) 11/07/2023   HCT 27.2 (L) 11/07/2023   PLT 212 11/07/2023   GLUCOSE 99 11/06/2023   CHOL 122 10/23/2023   TRIG 74 10/23/2023   HDL 52 10/23/2023   LDLCALC 55 10/23/2023   ALT 71 (H) 11/02/2023   AST 67 (H) 11/02/2023   NA 137 11/06/2023   K 4.2 11/06/2023   CL 106 11/06/2023   CREATININE 0.69 11/06/2023   BUN 17 11/06/2023   CO2 25 11/06/2023   INR 1.7 (H) 11/02/2023   HGBA1C 5.2 11/02/2023      Assessment / Plan: Patient is making good overall progress in terms of his recovery from AVR/CABG.  Cardiology deferred discontinuing amiodarone to surgery and I believe it is okay at this point to discontinue.  I reviewed his chest x-ray and there are no concerning findings.  I did not make any other  changes to his medication regimen.  We will see the patient again on a as needed basis for any surgically related needs or at request.      Medication Changes: No orders of the defined types were placed in this encounter.     Rowe Clack, PA-C  12/05/2023 2:08 PM

## 2023-12-07 ENCOUNTER — Ambulatory Visit: Payer: Medicare Other | Admitting: Physician Assistant

## 2023-12-07 ENCOUNTER — Other Ambulatory Visit: Payer: Self-pay

## 2023-12-14 ENCOUNTER — Ambulatory Visit (HOSPITAL_COMMUNITY)
Admission: RE | Admit: 2023-12-14 | Discharge: 2023-12-14 | Disposition: A | Discharge status: Home / Self Care | Payer: Medicare Other | Source: Ambulatory Visit | Attending: Family Medicine | Admitting: Family Medicine

## 2023-12-14 DIAGNOSIS — Z952 Presence of prosthetic heart valve: Secondary | ICD-10-CM | POA: Insufficient documentation

## 2023-12-14 LAB — ECHOCARDIOGRAM COMPLETE
AR max vel: 1.35 cm2
AV Area VTI: 1.41 cm2
AV Area mean vel: 1.36 cm2
AV Mean grad: 12 mm[Hg]
AV Peak grad: 23 mm[Hg]
Ao pk vel: 2.4 m/s
Area-P 1/2: 2.37 cm2
S' Lateral: 3.1 cm

## 2023-12-14 NOTE — Progress Notes (Signed)
*  PRELIMINARY RESULTS* Echocardiogram 2D Echocardiogram has been performed.  Stacey Drain 12/14/2023, 10:20 AM

## 2023-12-18 DIAGNOSIS — R42 Dizziness and giddiness: Secondary | ICD-10-CM | POA: Diagnosis not present

## 2023-12-21 ENCOUNTER — Other Ambulatory Visit (HOSPITAL_COMMUNITY): Payer: Self-pay

## 2023-12-21 ENCOUNTER — Other Ambulatory Visit: Payer: Self-pay

## 2023-12-22 ENCOUNTER — Other Ambulatory Visit: Payer: Self-pay

## 2023-12-27 DIAGNOSIS — R338 Other retention of urine: Secondary | ICD-10-CM | POA: Diagnosis not present

## 2024-01-09 ENCOUNTER — Other Ambulatory Visit (HOSPITAL_COMMUNITY): Payer: Self-pay

## 2024-01-23 DIAGNOSIS — R338 Other retention of urine: Secondary | ICD-10-CM | POA: Diagnosis not present

## 2024-01-24 DIAGNOSIS — R338 Other retention of urine: Secondary | ICD-10-CM | POA: Diagnosis not present

## 2024-01-26 NOTE — Progress Notes (Signed)
 Patient ID: Kyle Matthews MRN: [id number] DOB/AGE: 06/09/1946 78 y.o.  Primary Care Physician:Markeya Mincy, Alberto Hughes, MD Primary Cardiologist: Alois Arnt, MD  CC: Follow-up for aortic stenosis and coronary artery disease  FOCUSED CARDIOVASCULAR PROBLEM LIST:   AS 23 mm Inspiris AVR November 2024 CAD CABG LIMA to LAD, vein graft to ramus November 2024 Diastolic dysfunction LVH, G1 DD, EF 60 to 65%, normally functioning AVR TTE December 2024 Hypertension Hyperlipidemia LP(a) 120 Aortic atherosclerosis Chest x-ray 2024 Remote stroke BPH complicated by bladder obstruction requiring indwelling Foley catheter AKI with creatinine of 8.5 Recurrent hematuria requiring multiple blood transfusions Prostate artery embolization November 2024 BMI 25   HISTORY OF PRESENT ILLNESS:  09/2023: The patient is a 78 y.o. male with the indicated medical history here for hospital follow-up and for recommendations regarding his mixed aortic valvular disease.  He was admitted to the hospital with acute on chronic diastolic heart failure thought caused by severe hematuria and severe anemia.  He is received multiple blood transfusions due to hemoglobin of less than 6.  He was planned to undergo laparoscopic prostatectomy however this was postponed given his admission with acute on chronic diastolic heart failure.  He underwent coronary angiography which demonstrated moderate left main lesion with an RFR of 0.83.  He was seen by cardiothoracic surgery and a TAVR protocol CTA was performed as well.  He was ultimately started on dual antiplatelet therapy and is here today to discuss treatment options.  Unfortunately he presented to the emergency department 3 days after discharge.  He noted gross hematuria and inability to pass urine.  His Foley catheter was irrigated and urine flow was restored.  He was discharged home.  His DAPT was stopped.  He continues to have hematuria but it is improving.  He had an episode of  chest burning at rest.  He has had no reproducible exertional angina or exertional dyspnea.  He has had some peripheral edema which has worsened since he was discharged from the hospital.  He reports a low-sodium diet and he is unsure exactly why this edema is occurring.  Plan: Given hematuria refer for surgical AVR plus CABG.  2/25: In the interim before elective surgery could be performed the patient presented to [facility] with recurrent hematuria and a hemoglobin of 5.4.  He ended up requiring prostate artery embolization.  During that hospitalization he had his AVR and CABG performed.  His postoperative course was complicated by chronic anemia requiring transfusions and postoperative atrial fibrillation requiring amiodarone  bolus with conversion to normal sinus rhythm.  He was seen in cardiology clinic for follow-up in cardiology clinic in December and was doing well.  His blood pressure is well-controlled.  He had no complaints of recurrent hematuria.  He was noted to be in sinus rhythm.  The patient is doing well today.  His biggest issue remains ongoing hematuria and the need for a catheter.  He will be seeing urology to discuss a prostatectomy.  He feels much better and much less short of breath.  He has a lot of energy in the morning but he needs to take a nap in the afternoons.  He has not yet participated in cardiac rehabilitation.  His sternal incision is healing well.  He denies any severe bleeding not related to hematuria.  He said no issues with his medicines.  He denies any palpitations, paroxysmal atrial dyspnea, orthopnea.  He otherwise feels well and is without significant cardiovascular complaints today.  Past Medical History:  Diagnosis Date   BPH (benign prostatic hyperplasia)    Status post biopsy in 2009. - w/ LUTS   Essential hypertension    Hyperlipidemia    On Crestor  - followed by PCP   Moderate aortic regurgitation 04/2014   Moderate AS and AR - on echocardiogram    Moderate calcific aortic stenosis 04/2017   Stroke (HCC) 2007   No true etiology noted    Past Surgical History:  Procedure Laterality Date   AORTIC VALVE REPLACEMENT N/A 11/02/2023   Procedure: AORTIC VALVE REPLACEMENT (AVR) USING 23 MM INSPIRIS RESILIA AORTIC VALVE;  Surgeon: Bartley Lightning, MD;  Location: [facility] OR;  Service: Open Heart Surgery;  Laterality: N/A;   CORONARY ARTERY BYPASS GRAFT N/A 11/02/2023   Procedure: CORONARY ARTERY BYPASS GRAFTING (CABG) X TWO USING LEFT INTERNAL MAMMARY ARTERY AND RIGHT GREATER SAPHENEOUS VEIN HARVESTED ENDOSCOPICLY;  Surgeon: Bartley Lightning, MD;  Location: [facility] OR;  Service: Open Heart Surgery;  Laterality: N/A;   CORONARY PRESSURE/FFR STUDY N/A 10/09/2023   Procedure: CORONARY PRESSURE/FFR STUDY;  Surgeon: Kyra Phy, MD;  Location: [facility] INVASIVE CV LAB;  Service: Cardiovascular;  Laterality: N/A;   IR ANGIOGRAM PELVIS SELECTIVE OR SUPRASELECTIVE  10/27/2023   IR ANGIOGRAM PELVIS SELECTIVE OR SUPRASELECTIVE  10/27/2023   IR ANGIOGRAM SELECTIVE EACH ADDITIONAL VESSEL  10/27/2023   IR ANGIOGRAM SELECTIVE EACH ADDITIONAL VESSEL  10/27/2023   IR ANGIOGRAM SELECTIVE EACH ADDITIONAL VESSEL  10/27/2023   IR ANGIOGRAM SELECTIVE EACH ADDITIONAL VESSEL  10/27/2023   IR EMBO ARTERIAL NOT HEMORR HEMANG INC GUIDE ROADMAPPING  10/27/2023   IR US  GUIDE VASC ACCESS RIGHT  10/27/2023   RIGHT/LEFT HEART CATH AND CORONARY ANGIOGRAPHY N/A 10/09/2023   Procedure: RIGHT/LEFT HEART CATH AND CORONARY ANGIOGRAPHY;  Surgeon: Kyra Phy, MD;  Location: [facility] INVASIVE CV LAB;  Service: Cardiovascular;  Laterality: N/A;   TEE WITHOUT CARDIOVERSION N/A 11/02/2023   Procedure: TRANSESOPHAGEAL ECHOCARDIOGRAM;  Surgeon: Bartley Lightning, MD;  Location: [facility] OR;  Service: Open Heart Surgery;  Laterality: N/A;   TRANSTHORACIC ECHOCARDIOGRAM  04/28/2017    EF 65-70%. GR 1 DD. No regional wall motion abnormality. Moderate aortic stenosis with moderate regurgitation. Mean gradient 34 mmHg.     Family History  Problem Relation Age of Onset   Coronary artery disease Mother 66       50. 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 Lives with his wife of 49 years. He is a retired Marine scientist used to work for [employer]   4 cups of coffee a Matthews.    No structured exercise regimen   Social Drivers of Health   Financial Resource Strain: Not on file  Food Insecurity: Food Insecurity Present (10/23/2023)   Hunger Vital Sign    Worried About Running Out of Food in the Last Year: Sometimes true    Ran Out of Food in the Last Year: Sometimes true  Transportation Needs: Unknown (10/23/2023)   PRAPARE - Administrator, Civil Service (Medical): Patient unable to answer    Lack of Transportation (Non-Medical): No  Physical Activity: Not on file  Stress: Not on file  Social Connections: Not on file  Intimate Partner Violence:  Not At Risk (10/23/2023)   Humiliation, Afraid, Rape, and Kick questionnaire    Fear of Current or Ex-Partner: No    Emotionally Abused: No    Physically Abused: No    Sexually Abused: No     Prior to Admission medications   Medication Sig Start Date End Date Taking? Authorizing Provider  amLODipine  (NORVASC ) 5 MG tablet Take 1 tablet (5 mg total) by mouth daily. 10/13/23   Amin, Ankit C, MD  aspirin  EC 81 MG tablet Take 1 tablet (81 mg total) by mouth daily. Swallow whole. 10/12/23 11/11/23  Amin, Ankit C, MD  carvedilol  (COREG ) 3.125 MG tablet Take 1 tablet (3.125 mg total) by mouth 2 (two) times daily. 10/13/23   McDaniel, Jill D, NP  cephALEXin  (KEFLEX ) 500 MG capsule Take 1 capsule (500 mg total) by mouth 2 (two) times daily for 7 days. 10/15/23 10/22/23  Darlis Eisenmenger, PA-C  clopidogrel  (PLAVIX ) 75 MG tablet Take 1 tablet (75 mg total) by mouth daily. 10/12/23 11/11/23  Amin, Ankit C, MD  finasteride  (PROSCAR ) 5 MG tablet Take 5 mg by mouth every evening.    [provider]  ondansetron  (ZOFRAN -ODT) 4 MG disintegrating tablet Take 1 tablet (4 mg total) by mouth every 8 (eight) hours as needed for nausea or vomiting. 10/15/23   Darlis Eisenmenger, PA-C  rosuvastatin  (CRESTOR ) 40 MG tablet Take 40 mg by mouth daily.    [provider]    Allergies  Allergen Reactions   Tetanus Toxoids     Childhood Allergy     REVIEW OF SYSTEMS:  General: no fevers/chills/night sweats Eyes: no blurry vision, diplopia, or amaurosis ENT: no sore throat or hearing loss Resp: no cough, wheezing, or hemoptysis CV: no edema or palpitations GI: no abdominal pain, nausea, vomiting, diarrhea, or constipation GU: no dysuria, frequency, or hematuria Skin: no rash Neuro: no headache, numbness, tingling, or weakness of extremities Musculoskeletal: no joint pain or swelling Heme: no bleeding, DVT, or easy bruising Endo: no polydipsia or polyuria  BP 114/82   Pulse 75   Ht 5\' 3"  (1.6 m)    Wt 156 lb 6.4 oz (70.9 kg)   SpO2 97%   BMI 27.71 kg/m   PHYSICAL EXAM: GEN:  AO x 3 in no acute distress HEENT: normal Dentition: Normal Neck: JVP normal. +2 carotid upstrokes without bruits. No thyromegaly. Lungs: equal expansion, clear bilaterally CV: Apex is discrete and nondisplaced, RRR without murmurs gallops rubs or thrills; well-healed sternal incision Abd:  soft, non-tender, non-distended; no bruit; positive bowel sounds Ext: +2 edema without ecchymoses, or cyanosis Vascular: 2+ femoral pulses, 2+ radial pulses       Skin: warm and dry without rash Neuro: CN II-XII grossly intact; motor and sensory grossly intact    DATA AND STUDIES:  EKG: EKG from October 2024 demonstrates sinus rhythm with right bundle branch block  EKG Interpretation Date/Time:    Ventricular Rate:    PR Interval:    QRS Duration:    QT Interval:    QTC Calculation:   R Axis:      Text Interpretation:          Cardiac Studies & Procedures   CARDIAC CATHETERIZATION  CARDIAC CATHETERIZATION 10/09/2023  Narrative   Ost LM lesion is 50% stenosed.  1.  Moderate left main stenosis with an RFR of 0.83. 2.  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 3.  Fick cardiac output of 6.5 L/min and Fick cardiac index of 3.8 L/min/m with the following hemodynamics: Right atrial pressure mean of 7 Right ventricular pressure 42/-2 with an end-diastolic pressure of 8 mmHg Wedge pressure mean of 14 mmHg with V waves to 23 mmHg PA pressure of 38/16 with a mean of 24 mmHg PVR of 1.5 Woods units PA pulsatility index of 3.1  Recommendation: Continue evaluation for aortic valve intervention.  Will obtain TAVR protocol CTA tomorrow.  Findings Coronary Findings Diagnostic  Dominance: Right  Left Main Ost LM lesion is 50% stenosed.  Left Anterior Descending The vessel exhibits minimal luminal irregularities.  Ramus Intermedius There is mild diffuse disease throughout the  vessel.  Right Coronary Artery There is moderate diffuse disease throughout the vessel.  Intervention  No interventions have been documented.    ECHOCARDIOGRAM  ECHOCARDIOGRAM COMPLETE 12/14/2023  Narrative ECHOCARDIOGRAM REPORT    Patient Name:   HAZE FERRUFINO Date of Exam: 12/14/2023 Medical Rec #:  [id number]  Height:       63.0 in Accession #:    [id number] Weight:       151.2 lb Date of Birth:  1946-03-24   BSA:          1.717 m Patient Age:    77 years   BP:           158/69 mmHg Patient Gender: M          HR:           84 bpm. Exam Location:  [facility]  Procedure: 2D Echo, Cardiac Doppler and Color Doppler  Indications:    S/P Aortic Valve replacement Z95.2  History:        Patient has prior history of Echocardiogram examinations, most recent 10/03/2023. CHF, CAD and Previous Myocardial Infarction, Prior CABG, Stroke, Arrythmias:RBBB; Risk Factors:Hypertension and Dyslipidemia. Severe aortic stenosis - S/P AVR (aortic valve replacement). Aortic Valve: 23 mm Inspiris Resilia valve is present in the aortic position.  Sonographer:    Denese Finn RCS Referring Phys: [phone] MARY E BRANCH  IMPRESSIONS   1. Left ventricular ejection fraction, by estimation, is 60 to 65%. The left ventricle has normal function. The left ventricle has no regional wall motion abnormalities. There is mild concentric left ventricular hypertrophy. Left ventricular diastolic parameters are consistent with Grade I diastolic dysfunction (impaired relaxation). 2. Right ventricular systolic function is normal. The right ventricular size is normal. There is normal pulmonary artery systolic pressure. The estimated right ventricular systolic pressure is 26.4 mmHg. 3. Left atrial size was moderately dilated. 4.  Right atrial size was mild to moderately dilated. 5. The mitral valve is degenerative. Mild mitral valve regurgitation. Moderate mitral annular calcification. 6. The aortic valve has been  repaired/replaced. Aortic valve regurgitation is trivial. There is a 23 mm Inspiris Resilia valve present in the aortic position. Aortic valve mean gradient measures 12.0 mmHg. Dimentionless index 0.55. 7. The inferior vena cava is normal in size with greater than 50% respiratory variability, suggesting right atrial pressure of 3 mmHg.  Comparison(s): Prior images reviewed side by side. Interval AVR noted with grossly normal function and trivial aortic regurgitation.  FINDINGS Left Ventricle: Left ventricular ejection fraction, by estimation, is 60 to 65%. The left ventricle has normal function. The left ventricle has no regional wall motion abnormalities. The left ventricular internal cavity size was normal in size. There is mild concentric left ventricular hypertrophy. Left ventricular diastolic parameters are consistent with Grade I diastolic dysfunction (impaired relaxation).  Right Ventricle: The right ventricular size is normal. No increase in right ventricular wall thickness. Right ventricular systolic function is normal. There is normal pulmonary artery systolic pressure. The tricuspid regurgitant velocity is 2.42 m/s, and with an assumed right atrial pressure of 3 mmHg, the estimated right ventricular systolic pressure is 26.4 mmHg.  Left Atrium: Left atrial size was moderately dilated.  Right Atrium: Right atrial size was mild to moderately dilated.  Pericardium: There is no evidence of pericardial effusion.  Mitral Valve: The mitral valve is degenerative in appearance. Moderate mitral annular calcification. Mild mitral valve regurgitation.  Tricuspid Valve: The tricuspid valve is grossly normal. Tricuspid valve regurgitation is trivial.  Aortic Valve: The aortic valve has been repaired/replaced. Aortic valve regurgitation is trivial. Aortic valve mean gradient measures 12.0 mmHg. Aortic valve peak gradient measures 23.0 mmHg. Aortic valve area, by VTI measures 1.41 cm. There is a 23  mm Inspiris Resilia valve present in the aortic position.  Pulmonic Valve: The pulmonic valve was not well visualized. Pulmonic valve regurgitation is trivial.  Aorta: The aortic root is normal in size and structure.  Venous: The inferior vena cava is normal in size with greater than 50% respiratory variability, suggesting right atrial pressure of 3 mmHg.  IAS/Shunts: No atrial level shunt detected by color flow Doppler.   LEFT VENTRICLE PLAX 2D LVIDd:         4.70 cm   Diastology LVIDs:         3.10 cm   LV e' medial:    5.77 cm/s LV PW:         1.20 cm   LV E/e' medial:  17.7 LV IVS:        1.20 cm   LV e' lateral:   11.30 cm/s LVOT diam:     1.80 cm   LV E/e' lateral: 9.0 LV SV:         65 LV SV Index:   38 LVOT Area:     2.54 cm   RIGHT VENTRICLE RV S prime:     9.14 cm/s TAPSE (M-mode): 1.8 cm  LEFT ATRIUM             Index        RIGHT ATRIUM           Index LA diam:        3.40 cm 1.98 cm/m   RA Area:     22.00 cm LA Vol (A2C):   74.2 ml 43.22 ml/m  RA Volume:   66.00 ml  38.44 ml/m LA Vol (  A4C):   69.8 ml 40.65 ml/m LA Biplane Vol: 74.2 ml 43.22 ml/m AORTIC VALVE AV Area (Vmax):    1.35 cm AV Area (Vmean):   1.36 cm AV Area (VTI):     1.41 cm AV Vmax:           240.00 cm/s AV Vmean:          160.000 cm/s AV VTI:            0.460 m AV Peak Grad:      23.0 mmHg AV Mean Grad:      12.0 mmHg LVOT Vmax:         127.00 cm/s LVOT Vmean:        85.600 cm/s LVOT VTI:          0.255 m LVOT/AV VTI ratio: 0.55  AORTA Ao Root diam: 3.70 cm  MITRAL VALVE                TRICUSPID VALVE MV Area (PHT): 2.37 cm     TR Peak grad:   23.4 mmHg MV Decel Time: 320 msec     TR Vmax:        242.00 cm/s MV E velocity: 102.00 cm/s MV A velocity: 135.00 cm/s  SHUNTS MV E/A ratio:  0.76         Systemic VTI:  0.25 m Systemic Diam: 1.80 cm  Teddie Favre MD Electronically signed by Teddie Favre MD Signature Date/Time: 12/14/2023/11:58:07 AM    Final   TEE  ECHO INTRAOPERATIVE TEE 11/02/2023  Narrative *INTRAOPERATIVE TRANSESOPHAGEAL REPORT *    Patient Name:   Kyle Matthews     Date of Exam: 11/02/2023 Medical Rec #:  [id number]      Height:       63.0 in Accession #:    [id number]     Weight:       143.3 lb Date of Birth:  11-19-1946       BSA:          1.68 m Patient Age:    77 years       BP:           127/67 mmHg Patient Gender: M              HR:           78 bpm. Exam Location:  Anesthesiology  Transesophogeal exam was perform intraoperatively during surgical procedure. Patient was closely monitored under general anesthesia during the entirety of examination.  Indications:     CAD Native Vessel i25.10 Aortic Stenosis i35.0 Sonographer:     Sherline Distel Senior RDCS Performing Phys: [id number] Bartley Lightning Diagnosing Phys: Ellena Gurney MD  Complications: No known complications during this procedure. POST-OP IMPRESSIONS _ Left Ventricle: The left ventricle is unchanged from pre-bypass. _ Aortic Valve: No stenosis present. A bioprosthetic valve was placed, leaflets are freely mobile and leaflets thin. Manufactured by; Inspiris Resilia Size; 23mm. There is a mean PG 9 mmHg across the prosthetic aortic valve.  PRE-OP FINDINGS Left Ventricle: The left ventricle has low normal systolic function, with an ejection fraction of 50-55%. The cavity size was mildly dilated. There is mildly increased left ventricular wall thickness. There is mild concentric left ventricular hypertrophy.   Right Ventricle: The right ventricle has normal systolic function. The cavity was normal. There is no increase in right ventricular wall thickness.  Left Atrium: Left atrial size was dilated. No left atrial/left atrial appendage thrombus was detected.  Right Atrium:  Right atrial size was dilated.  Interatrial Septum: No atrial level shunt detected by color flow Doppler.  Pericardium: There is no evidence of pericardial effusion. There is a moderate pleural  effusion in the left lateral region.  Mitral Valve: The mitral valve is normal in structure. Mitral valve regurgitation is mild by color flow Doppler. There is No evidence of mitral stenosis.  Tricuspid Valve: The tricuspid valve was normal in structure. Tricuspid valve regurgitation is mild by color flow Doppler. No evidence of tricuspid stenosis is present.  Aortic Valve: The aortic valve is tricuspid Aortic valve regurgitation is severe by color flow Doppler. The jet is centrally-directed. There is severe stenosis of the aortic valve, with a calculated valve area of 0.82 cm. There is no evidence of aortic valve vegetation. There is severe calcifcation present on the aortic valve right coronary, left coronary and non-coronary cusps with severely decreased mobility.  Pulmonic Valve: The pulmonic valve was normal in structure. Pulmonic valve regurgitation is not visualized by color flow Doppler.   Aorta: The aortic root, ascending aorta and aortic arch are normal in size and structure.  +--------------+--------++ LEFT VENTRICLE         +--------------+--------++ PLAX 2D                +--------------+--------++ LVOT diam:    2.10 cm  +--------------+--------++ LVOT Area:    3.46 cm +--------------+--------++                        +--------------+--------++  +------------------+------------++ AORTIC VALVE                   +------------------+------------++ AV Area (Vmax):   0.75 cm     +------------------+------------++ AV Area (Vmean):  0.68 cm     +------------------+------------++ AV Area (VTI):    0.82 cm     +------------------+------------++ AV Vmax:          443.00 cm/s  +------------------+------------++ AV Vmean:         345.000 cm/s +------------------+------------++ AV VTI:           1.070 m      +------------------+------------++ AV Peak Grad:     78.5 mmHg    +------------------+------------++ AV Mean  Grad:     51.0 mmHg    +------------------+------------++ LVOT Vmax:        96.20 cm/s   +------------------+------------++ LVOT Vmean:       67.900 cm/s  +------------------+------------++ LVOT VTI:         0.252 m      +------------------+------------++ LVOT/AV VTI ratio:0.24         +------------------+------------++ AR PHT:           278 msec     +------------------+------------++  +-------------+-------++ AORTA                +-------------+-------++ Ao Root diam:2.70 cm +-------------+-------++   +--------------+-------+ SHUNTS                +--------------+-------+ Systemic VTI: 0.25 m  +--------------+-------+ Systemic Diam:2.10 cm +--------------+-------+   Ellena Gurney MD Electronically signed by Ellena Gurney MD Signature Date/Time: 11/05/2023/7:53:58 AM    Final  MONITORS  LONG TERM MONITOR (3-14 DAYS) 08/26/2020  CT SCANS  CT CORONARY MORPH W/CTA COR W/SCORE 10/10/2023  Addendum 10/10/2023  4:16 PM ADDENDUM REPORT: 10/10/2023 16:14  EXAM: OVER-READ INTERPRETATION  CT CHEST  The following report is an over-read performed by radiologist Dr. Micky [facility]bee [facility] on  10/10/2023. This over-read does not include interpretation of cardiac or coronary anatomy or pathology. The cardiac TAVR interpretation by the cardiologist is attached.  COMPARISON:  None.  FINDINGS: Extracardiac findings will be described separately under dictation for contemporaneously obtained CTA chest, abdomen and pelvis.  IMPRESSION: Please see separate dictation for contemporaneously obtained CTA chest, abdomen and pelvis dated 10/10/2023 for full description of relevant extracardiac findings.   Electronically Signed By: Avelino Lek M.D. On: 10/10/2023 16:14  Narrative CLINICAL DATA:  28M with severe aortic stenosis being evaluated for a TAVR procedure.  EXAM: Cardiac TAVR CT  TECHNIQUE: The  patient was scanned on a [employer]. A 120 kV retrospective scan was triggered in the descending thoracic aorta at 111 HU's. Gantry rotation speed was 250 msecs and collimation was .6 mm. No beta blockade or nitro were given. The 3D data set was reconstructed in 5% intervals of the R-R cycle. Systolic and diastolic phases were analyzed on a dedicated work station using MPR, MIP and VRT modes. The patient received 100 cc of contrast.  FINDINGS: Aortic Root:  Aortic valve: Trileaflet  Aortic valve calcium  score: 3834  Aortic annulus:  Diameter: 29mm x 21mm  Perimeter: 78mm  Area: 454 mm^2  Calcifications: Mild calcification adjacent to LCC  Coronary height: Min Left - 16mm; Min Right - 24mm  Sinotubular height: Left cusp - 26mm; Right cusp - 28mm; Noncoronary cusp - 25mm  LVOT (as measured 3 mm below the annulus):  Diameter: 29mm x 21mm  Area: 440 mm^2  Calcifications: No calcifications  Aortic sinus width: Left cusp - 32mm; Right cusp - 32mm; Noncoronary cusp - 34mm  Sinotubular junction width: 29mm x 27mm  Optimum Fluoroscopic Angle for Delivery: RAO 1 CAU 11  Cardiac:  Right atrium: Mild enlargement  Right ventricle: Normal size  Pulmonary arteries: Normal size  Pulmonary veins: Normal configuration  Left atrium: Normal size  Left ventricle: Normal size  Pericardium: Normal thickness  Coronary arteries: Calcium  score 2379 (91st percentile)  IMPRESSION: 1. Trileaflet aortic valve with severe calcifications (AV calcium  score 3834)  2. Aortic annulus measures 29mm x 21mm in diameter with perimeter 78mm and area 454 mm^2. Mild annular calcifications adjacent to left coronary cusp. Annular measurements are suitable for placement of a 26mm Edwards Sapien 3 valve.  3.  Sufficient coronary to annulus distance.  4.  Optimum Fluoroscopic Angle for Delivery:  RAO 1 CAU 11  5.  Coronary calcium  score 2379 (91st percentile)  Electronically  Signed: By: Carson Clara M.D. On: 10/10/2023 15:38          10/04/2023: B Natriuretic Peptide 501.3 11/02/2023: ALT 71 11/03/2023: Magnesium  3.1 11/06/2023: BUN 17; Creatinine, Ser 0.69; Potassium 4.2; Sodium 137 11/07/2023: Hemoglobin 8.6; Platelets 212   STS RISK CALCULATOR: 3.17%  NHYA CLASS: 2    ASSESSMENT AND PLAN:   S/P AVR (aortic valve replacement)  S/P CABG x 2 - Plan: Lipid panel, Hepatic function panel  Diastolic dysfunction  Hyperlipidemia LDL goal <55 - Plan: Lipid panel, Hepatic function panel  Elevated lipoprotein(a)  Aortic atherosclerosis (HCC) - Plan: Lipid panel, Hepatic function panel  History of CVA (cerebrovascular accident)  Bladder outlet obstruction  Status post AVR: Continue aspirin .  Reassuring function on recent echocardiogram.  Refer to cardiac rehabilitation.  Follow-up in 6 months. History of CABG: Continue aspirin , Crestor  40 mg and strict blood pressure control. Diastolic dysfunction: Patient is euvolemic on exam.  Will defer ARB and Jardiance for now.  If blood pressure  increases would start losartan or Micardis.  Hyperlipidemia: Given history of stroke patient's LDL goal is less than 55.  Check lipid panel and LFTs today. Elevated LP(a): Will target goal LDL less than 55.  Check lipids today. Aortic atherosclerosis: Continue aspirin , Crestor  40 mg, and strict blood pressure control. History of stroke: Target LDL of less than 55 and control cardiovascular risk factors with aspirin , blood pressure control, and strict lipid lowering. Bladder outlet obstruction: Patient is to have a definitive prostatectomy in the near future.  He is at low risk for cardiovascular complications now that he has been completely revascularized and his severe aortic stenosis has been managed.  I spent 33 minutes reviewing all clinical data during and prior to this visit including all relevant imaging studies, laboratories, clinical information from  other health systems and prior notes from both Cardiology and other specialties, interviewing the patient, conducting a complete physical examination, and coordinating care in order to formulate a comprehensive and personalized evaluation and treatment plan.   Sabina Beavers K Dellar Traber, MD  01/29/2024 11:06 AM    [facility] [address] [place], [place], [place]  [address] Phone: [phone]; Fax: [phone]

## 2024-01-29 ENCOUNTER — Other Ambulatory Visit: Payer: Self-pay | Admitting: *Deleted

## 2024-01-29 ENCOUNTER — Ambulatory Visit: Payer: Medicare Other | Attending: Internal Medicine | Admitting: Internal Medicine

## 2024-01-29 ENCOUNTER — Encounter: Payer: Self-pay | Admitting: Internal Medicine

## 2024-01-29 VITALS — BP 114/82 | HR 75 | Ht 63.0 in | Wt 156.4 lb

## 2024-01-29 DIAGNOSIS — I700 Atherosclerosis of aorta: Secondary | ICD-10-CM

## 2024-01-29 DIAGNOSIS — N320 Bladder-neck obstruction: Secondary | ICD-10-CM | POA: Diagnosis not present

## 2024-01-29 DIAGNOSIS — Z952 Presence of prosthetic heart valve: Secondary | ICD-10-CM

## 2024-01-29 DIAGNOSIS — E7841 Elevated Lipoprotein(a): Secondary | ICD-10-CM | POA: Diagnosis not present

## 2024-01-29 DIAGNOSIS — I5189 Other ill-defined heart diseases: Secondary | ICD-10-CM | POA: Diagnosis not present

## 2024-01-29 DIAGNOSIS — Z951 Presence of aortocoronary bypass graft: Secondary | ICD-10-CM | POA: Diagnosis not present

## 2024-01-29 DIAGNOSIS — E785 Hyperlipidemia, unspecified: Secondary | ICD-10-CM

## 2024-01-29 DIAGNOSIS — Z8673 Personal history of transient ischemic attack (TIA), and cerebral infarction without residual deficits: Secondary | ICD-10-CM

## 2024-01-29 NOTE — Patient Instructions (Signed)
 Medication Instructions:  No changes *If you need a refill on your cardiac medications before your next appointment, please call your pharmacy*   Lab Work: Lipids, liver function today - go to any [facility]  If you have labs (blood work) drawn today and your tests are completely normal, you will receive your results only by: MyChart Message (if you have MyChart) OR A paper copy in the mail If you have any lab test that is abnormal or we need to change your treatment, we will call you to review the results.   Testing/Procedures: none   Follow-Up: At [facility], you and your health needs are our priority.  As part of our continuing mission to provide you with exceptional heart care, we have created designated Provider Care Teams.  These Care Teams include your primary Cardiologist (physician) and Advanced Practice Providers (APPs -  Physician Assistants and Nurse Practitioners) who all work together to provide you with the care you need, when you need it.   Your next appointment:   6 month(s)  Provider:   Marlyse Single, PA-C       Other Instructions You have been referred to Cardiac Rehabilitation

## 2024-01-30 ENCOUNTER — Encounter: Payer: Self-pay | Admitting: Internal Medicine

## 2024-01-30 LAB — HEPATIC FUNCTION PANEL
ALT: 15 [IU]/L (ref 0–44)
AST: 21 [IU]/L (ref 0–40)
Albumin: 4.1 g/dL (ref 3.8–4.8)
Alkaline Phosphatase: 68 [IU]/L (ref 44–121)
Bilirubin Total: <0.2 mg/dL (ref 0.0–1.2)
Bilirubin, Direct: 0.1 mg/dL (ref 0.00–0.40)
Total Protein: 6.6 g/dL (ref 6.0–8.5)

## 2024-01-30 LAB — LIPID PANEL
Chol/HDL Ratio: 2.9 {ratio} (ref 0.0–5.0)
Cholesterol, Total: 179 mg/dL (ref 100–199)
HDL: 62 mg/dL (ref >39)
LDL Chol Calc (NIH): 80 mg/dL (ref 0–99)
Triglycerides: 228 mg/dL — ABNORMAL HIGH (ref 0–149)
VLDL Cholesterol Cal: 37 mg/dL (ref 5–40)

## 2024-02-05 ENCOUNTER — Telehealth: Payer: Self-pay | Admitting: Internal Medicine

## 2024-02-05 ENCOUNTER — Other Ambulatory Visit (HOSPITAL_COMMUNITY): Payer: Self-pay

## 2024-02-05 DIAGNOSIS — E785 Hyperlipidemia, unspecified: Secondary | ICD-10-CM

## 2024-02-05 DIAGNOSIS — E7841 Elevated Lipoprotein(a): Secondary | ICD-10-CM

## 2024-02-05 MED ORDER — EZETIMIBE 10 MG PO TABS
10.0000 mg | ORAL_TABLET | Freq: Every day | ORAL | 3 refills | Status: DC
Start: 2024-02-05 — End: 2024-02-20
  Filled 2024-02-05: qty 90, 90d supply, fill #0

## 2024-02-05 NOTE — Telephone Encounter (Signed)
Pt c/o medication issue:  1. Name of Medication:   New Medication  2. How are you currently taking this medication (dosage and times per day)?   3. Are you having a reaction (difficulty breathing--STAT)?   4. What is your medication issue?    Patient stated he recently had additional medication prescribed but the medication was not called in to his pharmacy [facility] [phone] - [place], [place] - [address] AT [address]

## 2024-02-05 NOTE — Telephone Encounter (Signed)
Pt advised and orders placed:   Add Zetia 10 mg check lipid panel and LFTs in 2 months

## 2024-02-06 ENCOUNTER — Other Ambulatory Visit (HOSPITAL_COMMUNITY): Payer: Self-pay

## 2024-02-06 ENCOUNTER — Other Ambulatory Visit: Payer: Self-pay | Admitting: Physician Assistant

## 2024-02-07 ENCOUNTER — Other Ambulatory Visit (HOSPITAL_COMMUNITY): Payer: Self-pay

## 2024-02-07 ENCOUNTER — Encounter (HOSPITAL_COMMUNITY): Payer: Medicare Other

## 2024-02-07 ENCOUNTER — Encounter (HOSPITAL_COMMUNITY): Payer: Self-pay

## 2024-02-08 ENCOUNTER — Encounter (HOSPITAL_COMMUNITY): Payer: Medicare Other

## 2024-02-12 ENCOUNTER — Encounter (HOSPITAL_COMMUNITY)
Admission: RE | Admit: 2024-02-12 | Discharge: 2024-02-12 | Disposition: A | Discharge status: Home / Self Care | Payer: Medicare Other | Source: Ambulatory Visit | Attending: Internal Medicine | Admitting: Internal Medicine

## 2024-02-12 VITALS — Ht 62.0 in | Wt 160.9 lb

## 2024-02-12 DIAGNOSIS — Z951 Presence of aortocoronary bypass graft: Secondary | ICD-10-CM | POA: Insufficient documentation

## 2024-02-12 DIAGNOSIS — Z952 Presence of prosthetic heart valve: Secondary | ICD-10-CM | POA: Insufficient documentation

## 2024-02-12 NOTE — Progress Notes (Signed)
 Kyle Matthews is a current tobacco user. Intervention for tobacco cessation was provided at the initial medical review. He was asked about readiness to quit and reported not ready at the moment . Patient will be given at first appt education about tobacco cessation using combination therapy, tobacco cessation classes, quit line, and quit smoking apps. Patient demonstrated understanding of this material. Staff will continue to provide encouragement and follow up with the patient throughout the program.

## 2024-02-12 NOTE — Progress Notes (Signed)
 Cardiac Individual Treatment Plan  Patient Details  Name: Kyle Matthews MRN: [id number] Date of Birth: Oct 12, 1946 Referring Provider:   Flowsheet Row CARDIAC REHAB PHASE II ORIENTATION from 02/12/2024 in [facility] CARDIAC REHABILITATION  Referring Provider Alverda Skeans. MD       Initial Encounter Date:  Flowsheet Row CARDIAC REHAB PHASE II ORIENTATION from 02/12/2024 in [place] CARDIAC REHABILITATION  Date 02/12/24       Visit Diagnosis: S/P CABG x 2  S/P AVR (aortic valve replacement)  Patient's Home Medications on Admission:  Current Outpatient Medications:    amLODipine (NORVASC) 5 MG tablet, Take 1 tablet (5 mg total) by mouth daily., Disp: 30 tablet, Rfl: 0   aspirin EC 81 MG tablet, Take 81 mg by mouth daily. Swallow whole., Disp: , Rfl:    ezetimibe (ZETIA) 10 MG tablet, Take 1 tablet (10 mg total) by mouth daily., Disp: 90 tablet, Rfl: 3   finasteride (PROSCAR) 5 MG tablet, Take 5 mg by mouth every evening., Disp: , Rfl:    rosuvastatin (CRESTOR) 40 MG tablet, Take 40 mg by mouth daily., Disp: , Rfl:    Fe Fum-Vit C-Vit B12-FA (TRIGELS-F FORTE) CAPS capsule, Take 1 capsule by mouth daily. (Patient not taking: Reported on 02/12/2024), Disp: 30 capsule, Rfl: 3   furosemide (LASIX) 20 MG tablet, Take 1 tablet (20 mg total) by mouth daily as needed. (Patient not taking: Reported on 02/12/2024), Disp: 30 tablet, Rfl: 3   hyoscyamine (LEVSIN SL) 0.125 MG SL tablet, Place 2 tablets (0.25 mg total) under the tongue every 4 (four) hours as needed (Bladder spasms). (Patient not taking: Reported on 02/12/2024), Disp: 30 tablet, Rfl: 0   nitroGLYCERIN (NITROSTAT) 0.4 MG SL tablet, Place 1 tablet (0.4 mg total) under the tongue every 5 (five) minutes as needed for chest pain. (Patient not taking: Reported on 02/12/2024), Disp: 25 tablet, Rfl: 3   ondansetron (ZOFRAN-ODT) 4 MG disintegrating tablet, Take 1 tablet (4 mg total) by mouth every 8 (eight) hours as needed for nausea or vomiting.  (Patient not taking: Reported on 02/12/2024), Disp: 12 tablet, Rfl: 0   oxyCODONE (OXY IR/ROXICODONE) 5 MG immediate release tablet, Take 1 tablet (5 mg total) by mouth every 4 (four) hours as needed for severe pain (pain score 7-10). (Patient not taking: Reported on 02/12/2024), Disp: 30 tablet, Rfl: 0   pantoprazole (PROTONIX) 40 MG tablet, Take 1 tablet (40 mg total) by mouth daily. (Patient not taking: Reported on 02/12/2024), Disp: 30 tablet, Rfl: 11  Past Medical History: Past Medical History:  Diagnosis Date   BPH (benign prostatic hyperplasia)    Status post biopsy in 2009. 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   Stroke (HCC) 2007   No true etiology noted    Tobacco Use: Social History   Tobacco Use  Smoking Status Former   Current packs/day: 0.00   Average packs/day: 1.5 packs/day for 24.0 years (36.0 ttl pk-yrs)   Types: Cigarettes   Quit date: 08/21/2023   Years since quitting: 0.4  Smokeless Tobacco Never    Labs: Review Flowsheet  More data exists      Latest Ref Rng & Units 10/04/2023 10/09/2023 10/23/2023 11/02/2023 01/29/2024  Labs for ITP Cardiac and Pulmonary Rehab  Cholestrol 100 - 199 mg/dL 914  - 782  -  179   LDL (calc) 0 - 99 mg/dL 54  - 55  - 80   HDL-C >39 mg/dL 49  - 52  - 62   Trlycerides 0 - 149 mg/dL 44  - 74  - 161   Hemoglobin A1c 4.8 - 5.6 % - - 4.7  5.2  -  PH, Arterial 7.35 - 7.45 - 7.426  - 7.326  7.365  7.299  7.268  7.303  7.378  7.366  7.429  7.372  7.47  -  PCO2 arterial 32 - 48 mmHg - 38.3  - 43.0  41.1  51.0  53.2  48.7  38.9  43.9  37.4  41.4  38  -  Bicarbonate 20.0 - 28.0 mmol/L - 26.8  26.5  25.2  - 22.4  23.4  25.1  24.7  24.1  22.9  25.1  24.5  24.7  24.1  27.7  -  TCO2 22 - 32 mmol/L - 28  28  26  24  24  25  27  26  25  26  24  26  25  26  26  26  26  25  26  25    -  Acid-base deficit 0.0 - 2.0 mmol/L - - - 3.0  2.0  2.0  3.0  2.0  2.0  1.0  -  O2 Saturation % - 63  73  92  - 97  96  95  88  100  100  100  84  100  100  98.6  -    Details       Multiple values from one day are sorted in reverse-chronological order         Capillary Blood Glucose: Lab Results  Component Value Date   GLUCAP 95 11/05/2023   GLUCAP 104 (H) 11/05/2023   GLUCAP 107 (H) 11/05/2023   GLUCAP 131 (H) 11/04/2023   GLUCAP 131 (H) 11/04/2023     Exercise Target Goals: Exercise Program Goal: Individual exercise prescription set using results from initial 6 min walk test and THRR while considering  patient's activity barriers and safety.   Exercise Prescription Goal: Starting with aerobic activity 30 plus minutes a day, 3 days per week for initial exercise prescription. Provide home exercise prescription and guidelines that participant acknowledges understanding prior to discharge.  Activity Barriers & Risk Stratification:  Activity Barriers & Cardiac Risk Stratification - 02/12/24 1424       Activity Barriers & Cardiac Risk Stratification   Activity Barriers Shortness of Breath;Muscular Weakness;Deconditioning    Cardiac Risk Stratification High             6 Minute Walk:  6 Minute Walk     Row Name 02/12/24 1541         6 Minute Walk   Phase Initial     Distance 1290 feet     Walk Time 6 minutes     # of Rest Breaks 0     MPH 2.44     METS 1.53     RPE 7     Perceived Dyspnea  0     VO2 Peak 5.37     Symptoms No     Resting HR 88 bpm     Resting BP 118/70     Resting Oxygen Saturation  93 %     Exercise Oxygen Saturation  during 6 min walk 97 %     Max Ex. HR 97 bpm     Max  Ex. BP 120/60     2 Minute Post BP 124/62              Oxygen Initial Assessment:   Oxygen Re-Evaluation:   Oxygen Discharge (Final Oxygen Re-Evaluation):   Initial Exercise Prescription:  Initial Exercise Prescription - 02/12/24 1500       Date  of Initial Exercise RX and Referring Provider   Date 02/12/24    Referring Provider Alverda Skeans. MD      Oxygen   Maintain Oxygen Saturation 88% or higher      Treadmill   MPH 1.7    Grade 0.5    Minutes 15    METs 2.42      REL-XR   Level 3    Speed 50    Minutes 15    METs 2      Prescription Details   Frequency (times per week) 1    Duration Progress to 30 minutes of continuous aerobic without signs/symptoms of physical distress      Intensity   THRR 40-80% of Max Heartrate 111-134    Ratings of Perceived Exertion 11-13    Perceived Dyspnea 0-4      Progression   Progression Continue to progress workloads to maintain intensity without signs/symptoms of physical distress.      Resistance Training   Training Prescription Yes    Weight 4 lbs    Reps 10-15             Perform Capillary Blood Glucose checks as needed.  Exercise Prescription Changes:   Exercise Prescription Changes     Row Name 02/12/24 1500             Response to Exercise   Blood Pressure (Admit) 118/70       Blood Pressure (Exercise) 120/60       Blood Pressure (Exit) 124/62       Heart Rate (Admit) 88 bpm       Heart Rate (Exercise) 101 bpm       Heart Rate (Exit) 80 bpm       Oxygen Saturation (Admit) 93 %       Oxygen Saturation (Exercise) 97 %       Rating of Perceived Exertion (Exercise) 7       Perceived Dyspnea (Exercise) 0       Symptoms None       Comments results                Exercise Comments:   Exercise Goals and Review:   Exercise Goals     Row Name 02/12/24 1546             Exercise Goals   Increase Physical Activity Yes       Intervention Provide advice, education, support and counseling about physical activity/exercise needs.;Develop an individualized exercise prescription for aerobic and resistive training based on initial evaluation findings, risk stratification, comorbidities and participant's personal goals.       Expected Outcomes  Short Term: Attend rehab on a regular basis to increase amount of physical activity.;Long Term: Add in home exercise to make exercise part of routine and to increase amount of physical activity.;Long Term: Exercising regularly at least 3-5 days a week.       Increase Strength and Stamina Yes       Intervention Provide advice, education, support and counseling about physical activity/exercise needs.;Develop an individualized exercise prescription for aerobic and resistive training based on initial evaluation findings,  risk stratification, comorbidities and participant's personal goals.       Expected Outcomes Short Term: Increase workloads from initial exercise prescription for resistance, speed, and METs.;Short Term: Perform resistance training exercises routinely during rehab and add in resistance training at home;Long Term: Improve cardiorespiratory fitness, muscular endurance and strength as measured by increased METs and functional capacity ( )       Able to understand and use rate of perceived exertion (RPE) scale Yes       Intervention Provide education and explanation on how to use RPE scale       Expected Outcomes Short Term: Able to use RPE daily in rehab to express subjective intensity level;Long Term:  Able to use RPE to guide intensity level when exercising independently       Able to understand and use Dyspnea scale Yes       Intervention Provide education and explanation on how to use Dyspnea scale       Expected Outcomes Short Term: Able to use Dyspnea scale daily in rehab to express subjective sense of shortness of breath during exertion;Long Term: Able to use Dyspnea scale to guide intensity level when exercising independently       Knowledge and understanding of Target Heart Rate Range (THRR) Yes       Intervention Provide education and explanation of THRR including how the numbers were predicted and where they are located for reference       Expected Outcomes Short Term: Able to  state/look up THRR;Long Term: Able to use THRR to govern intensity when exercising independently;Short Term: Able to use daily as guideline for intensity in rehab       Able to check pulse independently Yes       Intervention Provide education and demonstration on how to check pulse in carotid and radial arteries.;Review the importance of being able to check your own pulse for safety during independent exercise       Expected Outcomes Short Term: Able to explain why pulse checking is important during independent exercise;Long Term: Able to check pulse independently and accurately       Understanding of Exercise Prescription Yes       Intervention Provide education, explanation, and written materials on patient's individual exercise prescription       Expected Outcomes Short Term: Able to explain program exercise prescription;Long Term: Able to explain home exercise prescription to exercise independently                Exercise Goals Re-Evaluation :    Discharge Exercise Prescription (Final Exercise Prescription Changes):  Exercise Prescription Changes - 02/12/24 1500       Response to Exercise   Blood Pressure (Admit) 118/70    Blood Pressure (Exercise) 120/60    Blood Pressure (Exit) 124/62    Heart Rate (Admit) 88 bpm    Heart Rate (Exercise) 101 bpm    Heart Rate (Exit) 80 bpm    Oxygen Saturation (Admit) 93 %    Oxygen Saturation (Exercise) 97 %    Rating of Perceived Exertion (Exercise) 7    Perceived Dyspnea (Exercise) 0    Symptoms None    Comments results             Nutrition:  Target Goals: Understanding of nutrition guidelines, daily intake of sodium 1500mg , cholesterol 200mg , calories 30% from fat and 7% or less from saturated fats, daily to have 5 or more servings of fruits and vegetables.  Biometrics:  Pre Biometrics -  02/12/24 1547       Pre Biometrics   Height 5\' 2"  (1.575 m)    Weight 73 kg    Waist Circumference 36.5 inches    Hip  Circumference 40.5 inches    Waist to Hip Ratio 0.9 %    BMI (Calculated) 29.43    Grip Strength 25.9 kg    Single Leg Stand 16.6 seconds              Nutrition Therapy Plan and Nutrition Goals:  Nutrition Therapy & Goals - 02/12/24 1534       Intervention Plan   Intervention Prescribe, educate and counsel regarding individualized specific dietary modifications aiming towards targeted core components such as weight, hypertension, lipid management, diabetes, heart failure and other comorbidities.;Nutrition handout(s) given to patient.    Expected Outcomes Short Term Goal: Understand basic principles of dietary content, such as calories, fat, sodium, cholesterol and nutrients.;Long Term Goal: Adherence to prescribed nutrition plan.             Nutrition Assessments:  MEDIFICTS Score Key: >=70 Need to make dietary changes  40-70 Heart Healthy Diet <= 40 Therapeutic Level Cholesterol Diet  Flowsheet Row CARDIAC REHAB PHASE II ORIENTATION from 02/12/2024 in [facility] CARDIAC REHABILITATION  Picture Your Plate Total Score on Admission 54      Picture Your Plate Scores: <16 Unhealthy dietary pattern with much room for improvement. 41-50 Dietary pattern unlikely to meet recommendations for good health and room for improvement. 51-60 More healthful dietary pattern, with some room for improvement.  >60 Healthy dietary pattern, although there may be some specific behaviors that could be improved.    Nutrition Goals Re-Evaluation:   Nutrition Goals Discharge (Final Nutrition Goals Re-Evaluation):   Psychosocial: Target Goals: Acknowledge presence or absence of significant depression and/or stress, maximize coping skills, provide positive support system. Participant is able to verbalize types and ability to use techniques and skills needed for reducing stress and depression.  Initial Review & Psychosocial Screening:  Initial Psych Review & Screening - 02/12/24 1427        Initial Review   Current issues with Current Depression;Current Stress Concerns    Source of Stress Concerns Financial;Chronic Illness;Unable to participate in former interests or hobbies;Unable to perform yard/household activities    Comments Stressed with bills coming in from the health care system      Family Dynamics   Good Support System? Yes      Barriers   Psychosocial barriers to participate in program There are no identifiable barriers or psychosocial needs.      Screening Interventions   Interventions Encouraged to exercise;To provide support and resources with identified psychosocial needs;Provide feedback about the scores to participant    Expected Outcomes Long Term Goal: Stressors or current issues are controlled or eliminated.;Short Term goal: Identification and review with participant of any Quality of Life or Depression concerns found by scoring the questionnaire.;Long Term goal: The participant improves quality of Life and PHQ9 Scores as seen by post scores and/or verbalization of changes             Quality of Life Scores:  Quality of Life - 02/12/24 1538       Quality of Life   Select Quality of Life      Quality of Life Scores   Health/Function Pre 19.47 %    Socioeconomic Pre 23 %    Psych/Spiritual Pre 21.79 %    Family Pre 25.2 %    GLOBAL  Pre 21.47 %            Scores of 19 and below usually indicate a poorer quality of life in these areas.  A difference of  2-3 points is a clinically meaningful difference.  A difference of 2-3 points in the total score of the Quality of Life Index has been associated with significant improvement in overall quality of life, self-image, physical symptoms, and general health in studies assessing change in quality of life.  PHQ-9: Review Flowsheet       02/12/2024  Depression screen PHQ 2/9  Decreased Interest 1  Down, Depressed, Hopeless 2  PHQ - 2 Score 3  Altered sleeping 2  Tired, decreased energy 3   Change in appetite 0  Feeling bad or failure about yourself  2  Trouble concentrating 1  Moving slowly or fidgety/restless 0  Suicidal thoughts 0  PHQ-9 Score 11  Difficult doing work/chores Somewhat difficult   Interpretation of Total Score  Total Score Depression Severity:  1-4 = Minimal depression, 5-9 = Mild depression, 10-14 = Moderate depression, 15-19 = Moderately severe depression, 20-27 = Severe depression   Psychosocial Evaluation and Intervention:  Psychosocial Evaluation - 02/12/24 1529       Psychosocial Evaluation & Interventions   Interventions Stress management education;Relaxation education;Encouraged to exercise with the program and follow exercise prescription    Comments Kyle Matthews is coming in today expressing moderate depression that is related mainly towards the election results. Expresses some concern over his financial status as well with the healthcare bills coming in. Regarding his health, he doesn't seem to depressed by it, just wants to be able to get his stamina back. His PHQ results indicates some depression, and he does have some sleep issues. His financial barriers is what is causing him to only come to rehab once a week.    Expected Outcomes Short: Come to rehab for atleast a month to be monitored. Long: To improve his stamina and breathing.    Continue Psychosocial Services  Follow up required by staff             Psychosocial Re-Evaluation:   Psychosocial Discharge (Final Psychosocial Re-Evaluation):   Vocational Rehabilitation: Provide vocational rehab assistance to qualifying candidates.   Vocational Rehab Evaluation & Intervention:  Vocational Rehab - 02/12/24 1429       Initial Vocational Rehab Evaluation & Intervention   Assessment shows need for Vocational Rehabilitation No   Retired            Education: Education Goals: Education classes will be provided on a weekly basis, covering required topics. Participant will state  understanding/return demonstration of topics presented.  Learning Barriers/Preferences:  Learning Barriers/Preferences - 02/12/24 1426       Learning Barriers/Preferences   Learning Barriers Sight   glasses   Learning Preferences Written Material;Verbal Instruction;Skilled Demonstration;Audio             Education Topics: Hypertension, Hypertension Reduction -Define heart disease and high blood pressure. Discus how high blood pressure affects the body and ways to reduce high blood pressure.   Exercise and Your Heart -Discuss why it is important to exercise, the FITT principles of exercise, normal and abnormal responses to exercise, and how to exercise safely.   Angina -Discuss definition of angina, causes of angina, treatment of angina, and how to decrease risk of having angina.   Cardiac Medications -Review what the following cardiac medications are used for, how they affect the body, and side effects that may  occur when taking the medications.  Medications include Aspirin, Beta blockers, calcium channel blockers, ACE Inhibitors, angiotensin receptor blockers, diuretics, digoxin, and antihyperlipidemics.   Congestive Heart Failure -Discuss the definition of CHF, how to live with CHF, the signs and symptoms of CHF, and how keep track of weight and sodium intake.   Heart Disease and Intimacy -Discus the effect sexual activity has on the heart, how changes occur during intimacy as we age, and safety during sexual activity.   Smoking Cessation / COPD -Discuss different methods to quit smoking, the health benefits of quitting smoking, and the definition of COPD.   Nutrition I: Fats -Discuss the types of cholesterol, what cholesterol does to the heart, and how cholesterol levels can be controlled.   Nutrition II: Labels -Discuss the different components of food labels and how to read food label   Heart Parts/Heart Disease and PAD -Discuss the anatomy of the heart, the  pathway of blood circulation through the heart, and these are affected by heart disease.   Stress I: Signs and Symptoms -Discuss the causes of stress, how stress may lead to anxiety and depression, and ways to limit stress.   Stress II: Relaxation -Discuss different types of relaxation techniques to limit stress.   Warning Signs of Stroke / TIA -Discuss definition of a stroke, what the signs and symptoms are of a stroke, and how to identify when someone is having stroke.   Knowledge Questionnaire Score:  Knowledge Questionnaire Score - 02/12/24 1537       Knowledge Questionnaire Score   Pre Score 24    Post Score 28             Core Components/Risk Factors/Patient Goals at Admission:  Personal Goals and Risk Factors at Admission - 02/12/24 1534       Core Components/Risk Factors/Patient Goals on Admission    Weight Management Yes;Weight Maintenance;Weight Loss    Intervention Weight Management: Develop a combined nutrition and exercise program designed to reach desired caloric intake, while maintaining appropriate intake of nutrient and fiber, sodium and fats, and appropriate energy expenditure required for the weight goal.;Weight Management: Provide education and appropriate resources to help participant work on and attain dietary goals.;Weight Management/Obesity: Establish reasonable short term and long term weight goals.    Admit Weight 161 lb 1.6 oz (73.1 kg)    Goal Weight: Short Term 156 lb (70.8 kg)    Goal Weight: Long Term 151 lb (68.5 kg)    Expected Outcomes Short Term: Continue to assess and modify interventions until short term weight is achieved;Long Term: Adherence to nutrition and physical activity/exercise program aimed toward attainment of established weight goal;Weight Maintenance: Understanding of the daily nutrition guidelines, which includes 25-35% calories from fat, 7% or less cal from saturated fats, less than 200mg  cholesterol, less than 1.5gm of  sodium, & 5 or more servings of fruits and vegetables daily;Weight Loss: Understanding of general recommendations for a balanced deficit meal plan, which promotes 1-2 lb weight loss per week and includes a negative energy balance of [phone] kcal/d;Understanding recommendations for meals to include 15-35% energy as protein, 25-35% energy from fat, 35-60% energy from carbohydrates, less than 200mg  of dietary cholesterol, 20-35 gm of total fiber daily;Understanding of distribution of calorie intake throughout the day with the consumption of 4-5 meals/snacks    Tobacco Cessation Yes    Number of packs per day .5    Intervention Assist the participant in steps to quit. Provide individualized education and counseling about committing  to Tobacco Cessation, relapse prevention, and pharmacological support that can be provided by physician.;Education officer, environmental, assist with locating and accessing local/national Quit Smoking programs, and support quit date choice.    Expected Outcomes Short Term: Will demonstrate readiness to quit, by selecting a quit date.;Short Term: Will quit all tobacco product use, adhering to prevention of relapse plan.;Long Term: Complete abstinence from all tobacco products for at least 12 months from quit date.    Improve shortness of breath with ADL's Yes    Intervention Provide education, individualized exercise plan and daily activity instruction to help decrease symptoms of SOB with activities of daily living.    Expected Outcomes Short Term: Improve cardiorespiratory fitness to achieve a reduction of symptoms when performing ADLs;Long Term: Be able to perform more ADLs without symptoms or delay the onset of symptoms    Heart Failure Yes    Intervention Provide a combined exercise and nutrition program that is supplemented with education, support and counseling about heart failure. Directed toward relieving symptoms such as shortness of breath, decreased exercise tolerance, and  extremity edema.    Expected Outcomes Improve functional capacity of life;Short term: Attendance in program 2-3 days a week with increased exercise capacity. Reported lower sodium intake. Reported increased fruit and vegetable intake. Reports medication compliance.;Short term: Daily weights obtained and reported for increase. Utilizing diuretic protocols set by physician.;Long term: Adoption of self-care skills and reduction of barriers for early signs and symptoms recognition and intervention leading to self-care maintenance.    Hypertension Yes    Intervention Provide education on lifestyle modifcations including regular physical activity/exercise, weight management, moderate sodium restriction and increased consumption of fresh fruit, vegetables, and low fat dairy, alcohol moderation, and smoking cessation.;Monitor prescription use compliance.    Expected Outcomes Short Term: Continued assessment and intervention until BP is < 140/7mm HG in hypertensive participants. < 130/64mm HG in hypertensive participants with diabetes, heart failure or chronic kidney disease.;Long Term: Maintenance of blood pressure at goal levels.    Lipids Yes    Intervention Provide education and support for participant on nutrition & aerobic/resistive exercise along with prescribed medications to achieve LDL 70mg , HDL >40mg .    Expected Outcomes Short Term: Participant states understanding of desired cholesterol values and is compliant with medications prescribed. Participant is following exercise prescription and nutrition guidelines.;Long Term: Cholesterol controlled with medications as prescribed, with individualized exercise RX and with personalized nutrition plan. Value goals: LDL < 70mg , HDL > 40 mg.             Core Components/Risk Factors/Patient Goals Review:   Goals and Risk Factor Review     Row Name 02/12/24 1548             Core Components/Risk Factors/Patient Goals Review   Personal Goals Review  Tobacco Cessation       Review Nature is a current tobacco user. Intervention for tobacco cessation was provided at the initial medical review. He was asked about readiness to quit and reported not ready at the moment . Patient will be given at first appt education about tobacco cessation using combination therapy, tobacco cessation classes, quit line, and quit smoking apps. Patient demonstrated understanding of this material. Staff will continue to provide encouragement and follow up with the patient throughout the program.       Expected Outcomes Short: Review paperwork. Long: Tobacco Cessation.                Core Components/Risk Factors/Patient Goals at Discharge (Final  Review):   Goals and Risk Factor Review - 02/12/24 1548       Core Components/Risk Factors/Patient Goals Review   Personal Goals Review Tobacco Cessation    Review [place] is a current tobacco user. Intervention for tobacco cessation was provided at the initial medical review. He was asked about readiness to quit and reported not ready at the moment . Patient will be given at first appt education about tobacco cessation using combination therapy, tobacco cessation classes, quit line, and quit smoking apps. Patient demonstrated understanding of this material. Staff will continue to provide encouragement and follow up with the patient throughout the program.    Expected Outcomes Short: Review paperwork. Long: Tobacco Cessation.             ITP Comments:  ITP Comments     Row Name 02/12/24 1526           ITP Comments Patient attend orientation today.  Patient is attending Cardiac Rehabilitation Program.  Documentation for diagnosis can be found in [facility]/02[facility].  Reviewed medical chart, RPE/RPD, gym safety, and program guidelines.  Patient was fitted to equipment they will be using during rehab.  Patient is scheduled to start exercise on 02/14/24.   Initial ITP created and sent for review and signature by Dr. Dina Rich, Medical Director for Cardiac Rehabilitation Program.                Comments: Initial ITP

## 2024-02-12 NOTE — Patient Instructions (Signed)
 Patient Instructions  Patient Details  Name: Kyle Matthews MRN: [id number] Date of Birth: 1946-11-28 Referring Provider:  [name], MD  Below are your personal goals for exercise, nutrition, and risk factors. Our goal is to help you stay on track towards obtaining and maintaining these goals. We will be discussing your progress on these goals with you throughout the program.  Initial Exercise Prescription:  Initial Exercise Prescription - 02/12/24 1500       Date of Initial Exercise RX and Referring Provider   Date 02/12/24    Referring Provider Alverda Skeans. MD      Oxygen   Maintain Oxygen Saturation 88% or higher      Treadmill   MPH 1.7    Grade 0.5    Minutes 15    METs 2.42      REL-XR   Level 3    Speed 50    Minutes 15    METs 2      Prescription Details   Frequency (times per week) 1    Duration Progress to 30 minutes of continuous aerobic without signs/symptoms of physical distress      Intensity   THRR 40-80% of Max Heartrate 111-134    Ratings of Perceived Exertion 11-13    Perceived Dyspnea 0-4      Progression   Progression Continue to progress workloads to maintain intensity without signs/symptoms of physical distress.      Resistance Training   Training Prescription Yes    Weight 4 lbs    Reps 10-15             Exercise Goals: Frequency: Be able to perform aerobic exercise two to three times per week in program working toward 2-5 days per week of home exercise.  Intensity: Work with a perceived exertion of 11 (fairly light) - 15 (hard) while following your exercise prescription.  We will make changes to your prescription with you as you progress through the program.   Duration: Be able to do 30 to 45 minutes of continuous aerobic exercise in addition to a 5 minute warm-up and a 5 minute cool-down routine.   Nutrition Goals: Your personal nutrition goals will be established when you do your nutrition analysis with the  dietician.  The following are general nutrition guidelines to follow: Cholesterol < 200mg /day Sodium < 1500mg /day Fiber: Men over 50 yrs - 30 grams per day  Personal Goals:  Personal Goals and Risk Factors at Admission - 02/12/24 1534       Core Components/Risk Factors/Patient Goals on Admission    Weight Management Yes;Weight Maintenance;Weight Loss    Intervention Weight Management: Develop a combined nutrition and exercise program designed to reach desired caloric intake, while maintaining appropriate intake of nutrient and fiber, sodium and fats, and appropriate energy expenditure required for the weight goal.;Weight Management: Provide education and appropriate resources to help participant work on and attain dietary goals.;Weight Management/Obesity: Establish reasonable short term and long term weight goals.    Admit Weight 161 lb 1.6 oz (73.1 kg)    Goal Weight: Short Term 156 lb (70.8 kg)    Goal Weight: Long Term 151 lb (68.5 kg)    Expected Outcomes Short Term: Continue to assess and modify interventions until short term weight is achieved;Long Term: Adherence to nutrition and physical activity/exercise program aimed toward attainment of established weight goal;Weight Maintenance: Understanding of the daily nutrition guidelines, which includes 25-35% calories from fat, 7% or less cal from saturated fats, less than  200mg  cholesterol, less than 1.5gm of sodium, & 5 or more servings of fruits and vegetables daily;Weight Loss: Understanding of general recommendations for a balanced deficit meal plan, which promotes 1-2 lb weight loss per week and includes a negative energy balance of [phone] kcal/d;Understanding recommendations for meals to include 15-35% energy as protein, 25-35% energy from fat, 35-60% energy from carbohydrates, less than 200mg  of dietary cholesterol, 20-35 gm of total fiber daily;Understanding of distribution of calorie intake throughout the day with the consumption of 4-5  meals/snacks    Tobacco Cessation Yes    Number of packs per day .5    Intervention Assist the participant in steps to quit. Provide individualized education and counseling about committing to Tobacco Cessation, relapse prevention, and pharmacological support that can be provided by physician.;Education officer, environmental, assist with locating and accessing local/national Quit Smoking programs, and support quit date choice.    Expected Outcomes Short Term: Will demonstrate readiness to quit, by selecting a quit date.;Short Term: Will quit all tobacco product use, adhering to prevention of relapse plan.;Long Term: Complete abstinence from all tobacco products for at least 12 months from quit date.    Improve shortness of breath with ADL's Yes    Intervention Provide education, individualized exercise plan and daily activity instruction to help decrease symptoms of SOB with activities of daily living.    Expected Outcomes Short Term: Improve cardiorespiratory fitness to achieve a reduction of symptoms when performing ADLs;Long Term: Be able to perform more ADLs without symptoms or delay the onset of symptoms    Heart Failure Yes    Intervention Provide a combined exercise and nutrition program that is supplemented with education, support and counseling about heart failure. Directed toward relieving symptoms such as shortness of breath, decreased exercise tolerance, and extremity edema.    Expected Outcomes Improve functional capacity of life;Short term: Attendance in program 2-3 days a week with increased exercise capacity. Reported lower sodium intake. Reported increased fruit and vegetable intake. Reports medication compliance.;Short term: Daily weights obtained and reported for increase. Utilizing diuretic protocols set by physician.;Long term: Adoption of self-care skills and reduction of barriers for early signs and symptoms recognition and intervention leading to self-care maintenance.     Hypertension Yes    Intervention Provide education on lifestyle modifcations including regular physical activity/exercise, weight management, moderate sodium restriction and increased consumption of fresh fruit, vegetables, and low fat dairy, alcohol moderation, and smoking cessation.;Monitor prescription use compliance.    Expected Outcomes Short Term: Continued assessment and intervention until BP is < 140/12mm HG in hypertensive participants. < 130/44mm HG in hypertensive participants with diabetes, heart failure or chronic kidney disease.;Long Term: Maintenance of blood pressure at goal levels.    Lipids Yes    Intervention Provide education and support for participant on nutrition & aerobic/resistive exercise along with prescribed medications to achieve LDL 70mg , HDL >40mg .    Expected Outcomes Short Term: Participant states understanding of desired cholesterol values and is compliant with medications prescribed. Participant is following exercise prescription and nutrition guidelines.;Long Term: Cholesterol controlled with medications as prescribed, with individualized exercise RX and with personalized nutrition plan. Value goals: LDL < 70mg , HDL > 40 mg.             Tobacco Use Initial Evaluation: Social History   Tobacco Use  Smoking Status Former   Current packs/day: 0.00   Average packs/day: 1.5 packs/day for 24.0 years (36.0 ttl pk-yrs)   Types: Cigarettes   Quit date: 08/21/2023  Years since quitting: 0.4  Smokeless Tobacco Never    Exercise Goals and Review:  Exercise Goals     Row Name 02/12/24 1546             Exercise Goals   Increase Physical Activity Yes       Intervention Provide advice, education, support and counseling about physical activity/exercise needs.;Develop an individualized exercise prescription for aerobic and resistive training based on initial evaluation findings, risk stratification, comorbidities and participant's personal goals.       Expected  Outcomes Short Term: Attend rehab on a regular basis to increase amount of physical activity.;Long Term: Add in home exercise to make exercise part of routine and to increase amount of physical activity.;Long Term: Exercising regularly at least 3-5 days a week.       Increase Strength and Stamina Yes       Intervention Provide advice, education, support and counseling about physical activity/exercise needs.;Develop an individualized exercise prescription for aerobic and resistive training based on initial evaluation findings, risk stratification, comorbidities and participant's personal goals.       Expected Outcomes Short Term: Increase workloads from initial exercise prescription for resistance, speed, and METs.;Short Term: Perform resistance training exercises routinely during rehab and add in resistance training at home;Long Term: Improve cardiorespiratory fitness, muscular endurance and strength as measured by increased METs and functional capacity ( )       Able to understand and use rate of perceived exertion (RPE) scale Yes       Intervention Provide education and explanation on how to use RPE scale       Expected Outcomes Short Term: Able to use RPE daily in rehab to express subjective intensity level;Long Term:  Able to use RPE to guide intensity level when exercising independently       Able to understand and use Dyspnea scale Yes       Intervention Provide education and explanation on how to use Dyspnea scale       Expected Outcomes Short Term: Able to use Dyspnea scale daily in rehab to express subjective sense of shortness of breath during exertion;Long Term: Able to use Dyspnea scale to guide intensity level when exercising independently       Knowledge and understanding of Target Heart Rate Range (THRR) Yes       Intervention Provide education and explanation of THRR including how the numbers were predicted and where they are located for reference       Expected Outcomes Short Term:  Able to state/look up THRR;Long Term: Able to use THRR to govern intensity when exercising independently;Short Term: Able to use daily as guideline for intensity in rehab       Able to check pulse independently Yes       Intervention Provide education and demonstration on how to check pulse in carotid and radial arteries.;Review the importance of being able to check your own pulse for safety during independent exercise       Expected Outcomes Short Term: Able to explain why pulse checking is important during independent exercise;Long Term: Able to check pulse independently and accurately       Understanding of Exercise Prescription Yes       Intervention Provide education, explanation, and written materials on patient's individual exercise prescription       Expected Outcomes Short Term: Able to explain program exercise prescription;Long Term: Able to explain home exercise prescription to exercise independently  Copy of goals given to participant.

## 2024-02-14 ENCOUNTER — Encounter (HOSPITAL_COMMUNITY): Payer: Medicare Other

## 2024-02-14 ENCOUNTER — Encounter (HOSPITAL_COMMUNITY): Payer: Self-pay | Admitting: *Deleted

## 2024-02-14 DIAGNOSIS — Z951 Presence of aortocoronary bypass graft: Secondary | ICD-10-CM

## 2024-02-14 DIAGNOSIS — Z952 Presence of prosthetic heart valve: Secondary | ICD-10-CM

## 2024-02-14 NOTE — Progress Notes (Signed)
 Cardiac Individual Treatment Plan  Patient Details  Name: Kyle Matthews MRN: [id number] Date of Birth: Oct 23, 1946 Referring Provider:   Flowsheet Row CARDIAC REHAB PHASE II ORIENTATION from 02/12/2024 in [facility] CARDIAC REHABILITATION  Referring Provider Alverda Skeans. MD       Initial Encounter Date:  Flowsheet Row CARDIAC REHAB PHASE II ORIENTATION from 02/12/2024 in [place] CARDIAC REHABILITATION  Date 02/12/24       Visit Diagnosis: S/P CABG x 2  S/P AVR (aortic valve replacement)  Patient's Home Medications on Admission:  Current Outpatient Medications:    amLODipine (NORVASC) 5 MG tablet, Take 1 tablet (5 mg total) by mouth daily., Disp: 30 tablet, Rfl: 0   aspirin EC 81 MG tablet, Take 81 mg by mouth daily. Swallow whole., Disp: , Rfl:    ezetimibe (ZETIA) 10 MG tablet, Take 1 tablet (10 mg total) by mouth daily., Disp: 90 tablet, Rfl: 3   Fe Fum-Vit C-Vit B12-FA (TRIGELS-F FORTE) CAPS capsule, Take 1 capsule by mouth daily. (Patient not taking: Reported on 02/12/2024), Disp: 30 capsule, Rfl: 3   finasteride (PROSCAR) 5 MG tablet, Take 5 mg by mouth every evening., Disp: , Rfl:    furosemide (LASIX) 20 MG tablet, Take 1 tablet (20 mg total) by mouth daily as needed. (Patient not taking: Reported on 02/12/2024), Disp: 30 tablet, Rfl: 3   hyoscyamine (LEVSIN SL) 0.125 MG SL tablet, Place 2 tablets (0.25 mg total) under the tongue every 4 (four) hours as needed (Bladder spasms). (Patient not taking: Reported on 02/12/2024), Disp: 30 tablet, Rfl: 0   nitroGLYCERIN (NITROSTAT) 0.4 MG SL tablet, Place 1 tablet (0.4 mg total) under the tongue every 5 (five) minutes as needed for chest pain. (Patient not taking: Reported on 02/12/2024), Disp: 25 tablet, Rfl: 3   ondansetron (ZOFRAN-ODT) 4 MG disintegrating tablet, Take 1 tablet (4 mg total) by mouth every 8 (eight) hours as needed for nausea or vomiting. (Patient not taking: Reported on 02/12/2024), Disp: 12 tablet, Rfl: 0   oxyCODONE  (OXY IR/ROXICODONE) 5 MG immediate release tablet, Take 1 tablet (5 mg total) by mouth every 4 (four) hours as needed for severe pain (pain score 7-10). (Patient not taking: Reported on 02/12/2024), Disp: 30 tablet, Rfl: 0   pantoprazole (PROTONIX) 40 MG tablet, Take 1 tablet (40 mg total) by mouth daily. (Patient not taking: Reported on 02/12/2024), Disp: 30 tablet, Rfl: 11   rosuvastatin (CRESTOR) 40 MG tablet, Take 40 mg by mouth daily., Disp: , Rfl:   Past Medical History: Past Medical History:  Diagnosis Date   BPH (benign prostatic hyperplasia)    Status post biopsy in 2009. 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   Stroke (HCC) 2007   No true etiology noted    Tobacco Use: Social History   Tobacco Use  Smoking Status Former   Current packs/day: 0.00   Average packs/day: 1.5 packs/day for 24.0 years (36.0 ttl pk-yrs)   Types: Cigarettes   Quit date: 08/21/2023   Years since quitting: 0.4  Smokeless Tobacco Never    Labs: Review Flowsheet  More data exists      Latest Ref Rng & Units 10/04/2023 10/09/2023 10/23/2023 11/02/2023 01/29/2024  Labs for ITP Cardiac and Pulmonary Rehab  Cholestrol 100 - 199 mg/dL 644  - 034  -  179   LDL (calc) 0 - 99 mg/dL 54  - 55  - 80   HDL-C >39 mg/dL 49  - 52  - 62   Trlycerides 0 - 149 mg/dL 44  - 74  - 409   Hemoglobin A1c 4.8 - 5.6 % - - 4.7  5.2  -  PH, Arterial 7.35 - 7.45 - 7.426  - 7.326  7.365  7.299  7.268  7.303  7.378  7.366  7.429  7.372  7.47  -  PCO2 arterial 32 - 48 mmHg - 38.3  - 43.0  41.1  51.0  53.2  48.7  38.9  43.9  37.4  41.4  38  -  Bicarbonate 20.0 - 28.0 mmol/L - 26.8  26.5  25.2  - 22.4  23.4  25.1  24.7  24.1  22.9  25.1  24.5  24.7  24.1  27.7  -  TCO2 22 - 32 mmol/L - 28  28  26  24  24  25  27  26  25  26  24  26  25  26  26  26  26  25  26  25    -  Acid-base deficit 0.0 - 2.0 mmol/L - - - 3.0  2.0  2.0  3.0  2.0  2.0  1.0  -  O2 Saturation % - 63  73  92  - 97  96  95  88  100  100  100  84  100  100  98.6  -    Details       Multiple values from one day are sorted in reverse-chronological order         Capillary Blood Glucose: Lab Results  Component Value Date   GLUCAP 95 11/05/2023   GLUCAP 104 (H) 11/05/2023   GLUCAP 107 (H) 11/05/2023   GLUCAP 131 (H) 11/04/2023   GLUCAP 131 (H) 11/04/2023     Exercise Target Goals: Exercise Program Goal: Individual exercise prescription set using results from initial 6 min walk test and THRR while considering  patient's activity barriers and safety.   Exercise Prescription Goal: Starting with aerobic activity 30 plus minutes a day, 3 days per week for initial exercise prescription. Provide home exercise prescription and guidelines that participant acknowledges understanding prior to discharge.  Activity Barriers & Risk Stratification:  Activity Barriers & Cardiac Risk Stratification - 02/12/24 1424       Activity Barriers & Cardiac Risk Stratification   Activity Barriers Shortness of Breath;Muscular Weakness;Deconditioning    Cardiac Risk Stratification High             6 Minute Walk:  6 Minute Walk     Row Name 02/12/24 1541         6 Minute Walk   Phase Initial     Distance 1290 feet     Walk Time 6 minutes     # of Rest Breaks 0     MPH 2.44     METS 1.53     RPE 7     Perceived Dyspnea  0     VO2 Peak 5.37     Symptoms No     Resting HR 88 bpm     Resting BP 118/70     Resting Oxygen Saturation  93 %     Exercise Oxygen Saturation  during 6 min walk 97 %     Max Ex. HR 97 bpm     Max  Ex. BP 120/60     2 Minute Post BP 124/62              Oxygen Initial Assessment:   Oxygen Re-Evaluation:   Oxygen Discharge (Final Oxygen Re-Evaluation):   Initial Exercise Prescription:  Initial Exercise Prescription - 02/12/24 1500       Date  of Initial Exercise RX and Referring Provider   Date 02/12/24    Referring Provider Alverda Skeans. MD      Oxygen   Maintain Oxygen Saturation 88% or higher      Treadmill   MPH 1.7    Grade 0.5    Minutes 15    METs 2.42      REL-XR   Level 3    Speed 50    Minutes 15    METs 2      Prescription Details   Frequency (times per week) 1    Duration Progress to 30 minutes of continuous aerobic without signs/symptoms of physical distress      Intensity   THRR 40-80% of Max Heartrate 111-134    Ratings of Perceived Exertion 11-13    Perceived Dyspnea 0-4      Progression   Progression Continue to progress workloads to maintain intensity without signs/symptoms of physical distress.      Resistance Training   Training Prescription Yes    Weight 4 lbs    Reps 10-15             Perform Capillary Blood Glucose checks as needed.  Exercise Prescription Changes:   Exercise Prescription Changes     Row Name 02/12/24 1500             Response to Exercise   Blood Pressure (Admit) 118/70       Blood Pressure (Exercise) 120/60       Blood Pressure (Exit) 124/62       Heart Rate (Admit) 88 bpm       Heart Rate (Exercise) 101 bpm       Heart Rate (Exit) 80 bpm       Oxygen Saturation (Admit) 93 %       Oxygen Saturation (Exercise) 97 %       Rating of Perceived Exertion (Exercise) 7       Perceived Dyspnea (Exercise) 0       Symptoms None       Comments results                Exercise Comments:   Exercise Goals and Review:   Exercise Goals     Row Name 02/12/24 1546             Exercise Goals   Increase Physical Activity Yes       Intervention Provide advice, education, support and counseling about physical activity/exercise needs.;Develop an individualized exercise prescription for aerobic and resistive training based on initial evaluation findings, risk stratification, comorbidities and participant's personal goals.       Expected Outcomes  Short Term: Attend rehab on a regular basis to increase amount of physical activity.;Long Term: Add in home exercise to make exercise part of routine and to increase amount of physical activity.;Long Term: Exercising regularly at least 3-5 days a week.       Increase Strength and Stamina Yes       Intervention Provide advice, education, support and counseling about physical activity/exercise needs.;Develop an individualized exercise prescription for aerobic and resistive training based on initial evaluation findings,  risk stratification, comorbidities and participant's personal goals.       Expected Outcomes Short Term: Increase workloads from initial exercise prescription for resistance, speed, and METs.;Short Term: Perform resistance training exercises routinely during rehab and add in resistance training at home;Long Term: Improve cardiorespiratory fitness, muscular endurance and strength as measured by increased METs and functional capacity ( )       Able to understand and use rate of perceived exertion (RPE) scale Yes       Intervention Provide education and explanation on how to use RPE scale       Expected Outcomes Short Term: Able to use RPE daily in rehab to express subjective intensity level;Long Term:  Able to use RPE to guide intensity level when exercising independently       Able to understand and use Dyspnea scale Yes       Intervention Provide education and explanation on how to use Dyspnea scale       Expected Outcomes Short Term: Able to use Dyspnea scale daily in rehab to express subjective sense of shortness of breath during exertion;Long Term: Able to use Dyspnea scale to guide intensity level when exercising independently       Knowledge and understanding of Target Heart Rate Range (THRR) Yes       Intervention Provide education and explanation of THRR including how the numbers were predicted and where they are located for reference       Expected Outcomes Short Term: Able to  state/look up THRR;Long Term: Able to use THRR to govern intensity when exercising independently;Short Term: Able to use daily as guideline for intensity in rehab       Able to check pulse independently Yes       Intervention Provide education and demonstration on how to check pulse in carotid and radial arteries.;Review the importance of being able to check your own pulse for safety during independent exercise       Expected Outcomes Short Term: Able to explain why pulse checking is important during independent exercise;Long Term: Able to check pulse independently and accurately       Understanding of Exercise Prescription Yes       Intervention Provide education, explanation, and written materials on patient's individual exercise prescription       Expected Outcomes Short Term: Able to explain program exercise prescription;Long Term: Able to explain home exercise prescription to exercise independently                Exercise Goals Re-Evaluation :    Discharge Exercise Prescription (Final Exercise Prescription Changes):  Exercise Prescription Changes - 02/12/24 1500       Response to Exercise   Blood Pressure (Admit) 118/70    Blood Pressure (Exercise) 120/60    Blood Pressure (Exit) 124/62    Heart Rate (Admit) 88 bpm    Heart Rate (Exercise) 101 bpm    Heart Rate (Exit) 80 bpm    Oxygen Saturation (Admit) 93 %    Oxygen Saturation (Exercise) 97 %    Rating of Perceived Exertion (Exercise) 7    Perceived Dyspnea (Exercise) 0    Symptoms None    Comments results             Nutrition:  Target Goals: Understanding of nutrition guidelines, daily intake of sodium 1500mg , cholesterol 200mg , calories 30% from fat and 7% or less from saturated fats, daily to have 5 or more servings of fruits and vegetables.  Biometrics:  Pre Biometrics -  02/12/24 1547       Pre Biometrics   Height 5\' 2"  (1.575 m)    Weight 160 lb 15 oz (73 kg)    Waist Circumference 36.5 inches     Hip Circumference 40.5 inches    Waist to Hip Ratio 0.9 %    BMI (Calculated) 29.43    Grip Strength 25.9 kg    Single Leg Stand 16.6 seconds              Nutrition Therapy Plan and Nutrition Goals:  Nutrition Therapy & Goals - 02/12/24 1534       Intervention Plan   Intervention Prescribe, educate and counsel regarding individualized specific dietary modifications aiming towards targeted core components such as weight, hypertension, lipid management, diabetes, heart failure and other comorbidities.;Nutrition handout(s) given to patient.    Expected Outcomes Short Term Goal: Understand basic principles of dietary content, such as calories, fat, sodium, cholesterol and nutrients.;Long Term Goal: Adherence to prescribed nutrition plan.             Nutrition Assessments:  MEDIFICTS Score Key: >=70 Need to make dietary changes  40-70 Heart Healthy Diet <= 40 Therapeutic Level Cholesterol Diet  Flowsheet Row CARDIAC REHAB PHASE II ORIENTATION from 02/12/2024 in [facility] CARDIAC REHABILITATION  Picture Your Plate Total Score on Admission 54      Picture Your Plate Scores: <54 Unhealthy dietary pattern with much room for improvement. 41-50 Dietary pattern unlikely to meet recommendations for good health and room for improvement. 51-60 More healthful dietary pattern, with some room for improvement.  >60 Healthy dietary pattern, although there may be some specific behaviors that could be improved.    Nutrition Goals Re-Evaluation:   Nutrition Goals Discharge (Final Nutrition Goals Re-Evaluation):   Psychosocial: Target Goals: Acknowledge presence or absence of significant depression and/or stress, maximize coping skills, provide positive support system. Participant is able to verbalize types and ability to use techniques and skills needed for reducing stress and depression.  Initial Review & Psychosocial Screening:  Initial Psych Review & Screening - 02/12/24 1427        Initial Review   Current issues with Current Depression;Current Stress Concerns    Source of Stress Concerns Financial;Chronic Illness;Unable to participate in former interests or hobbies;Unable to perform yard/household activities    Comments Stressed with bills coming in from the health care system      Family Dynamics   Good Support System? Yes      Barriers   Psychosocial barriers to participate in program There are no identifiable barriers or psychosocial needs.      Screening Interventions   Interventions Encouraged to exercise;To provide support and resources with identified psychosocial needs;Provide feedback about the scores to participant    Expected Outcomes Long Term Goal: Stressors or current issues are controlled or eliminated.;Short Term goal: Identification and review with participant of any Quality of Life or Depression concerns found by scoring the questionnaire.;Long Term goal: The participant improves quality of Life and PHQ9 Scores as seen by post scores and/or verbalization of changes             Quality of Life Scores:  Quality of Life - 02/12/24 1538       Quality of Life   Select Quality of Life      Quality of Life Scores   Health/Function Pre 19.47 %    Socioeconomic Pre 23 %    Psych/Spiritual Pre 21.79 %    Family Pre 25.2 %  GLOBAL Pre 21.47 %            Scores of 19 and below usually indicate a poorer quality of life in these areas.  A difference of  2-3 points is a clinically meaningful difference.  A difference of 2-3 points in the total score of the Quality of Life Index has been associated with significant improvement in overall quality of life, self-image, physical symptoms, and general health in studies assessing change in quality of life.  PHQ-9: Review Flowsheet       02/12/2024  Depression screen PHQ 2/9  Decreased Interest 1  Down, Depressed, Hopeless 2  PHQ - 2 Score 3  Altered sleeping 2  Tired, decreased energy 3   Change in appetite 0  Feeling bad or failure about yourself  2  Trouble concentrating 1  Moving slowly or fidgety/restless 0  Suicidal thoughts 0  PHQ-9 Score 11  Difficult doing work/chores Somewhat difficult   Interpretation of Total Score  Total Score Depression Severity:  1-4 = Minimal depression, 5-9 = Mild depression, 10-14 = Moderate depression, 15-19 = Moderately severe depression, 20-27 = Severe depression   Psychosocial Evaluation and Intervention:  Psychosocial Evaluation - 02/12/24 1529       Psychosocial Evaluation & Interventions   Interventions Stress management education;Relaxation education;Encouraged to exercise with the program and follow exercise prescription    Comments Kyle Matthews is coming in today expressing moderate depression that is related mainly towards the election results. Expresses some concern over his financial status as well with the healthcare bills coming in. Regarding his health, he doesn't seem to depressed by it, just wants to be able to get his stamina back. His PHQ results indicates some depression, and he does have some sleep issues. His financial barriers is what is causing him to only come to rehab once a week.    Expected Outcomes Short: Come to rehab for atleast a month to be monitored. Long: To improve his stamina and breathing.    Continue Psychosocial Services  Follow up required by staff             Psychosocial Re-Evaluation:   Psychosocial Discharge (Final Psychosocial Re-Evaluation):   Vocational Rehabilitation: Provide vocational rehab assistance to qualifying candidates.   Vocational Rehab Evaluation & Intervention:  Vocational Rehab - 02/12/24 1429       Initial Vocational Rehab Evaluation & Intervention   Assessment shows need for Vocational Rehabilitation No   Retired            Education: Education Goals: Education classes will be provided on a weekly basis, covering required topics. Participant will state  understanding/return demonstration of topics presented.  Learning Barriers/Preferences:  Learning Barriers/Preferences - 02/12/24 1426       Learning Barriers/Preferences   Learning Barriers Sight   glasses   Learning Preferences Written Material;Verbal Instruction;Skilled Demonstration;Audio             Education Topics: Hypertension, Hypertension Reduction -Define heart disease and high blood pressure. Discus how high blood pressure affects the body and ways to reduce high blood pressure.   Exercise and Your Heart -Discuss why it is important to exercise, the FITT principles of exercise, normal and abnormal responses to exercise, and how to exercise safely.   Angina -Discuss definition of angina, causes of angina, treatment of angina, and how to decrease risk of having angina.   Cardiac Medications -Review what the following cardiac medications are used for, how they affect the body, and side effects that  may occur when taking the medications.  Medications include Aspirin, Beta blockers, calcium channel blockers, ACE Inhibitors, angiotensin receptor blockers, diuretics, digoxin, and antihyperlipidemics.   Congestive Heart Failure -Discuss the definition of CHF, how to live with CHF, the signs and symptoms of CHF, and how keep track of weight and sodium intake.   Heart Disease and Intimacy -Discus the effect sexual activity has on the heart, how changes occur during intimacy as we age, and safety during sexual activity.   Smoking Cessation / COPD -Discuss different methods to quit smoking, the health benefits of quitting smoking, and the definition of COPD.   Nutrition I: Fats -Discuss the types of cholesterol, what cholesterol does to the heart, and how cholesterol levels can be controlled.   Nutrition II: Labels -Discuss the different components of food labels and how to read food label   Heart Parts/Heart Disease and PAD -Discuss the anatomy of the heart, the  pathway of blood circulation through the heart, and these are affected by heart disease.   Stress I: Signs and Symptoms -Discuss the causes of stress, how stress may lead to anxiety and depression, and ways to limit stress.   Stress II: Relaxation -Discuss different types of relaxation techniques to limit stress.   Warning Signs of Stroke / TIA -Discuss definition of a stroke, what the signs and symptoms are of a stroke, and how to identify when someone is having stroke.   Knowledge Questionnaire Score:  Knowledge Questionnaire Score - 02/12/24 1537       Knowledge Questionnaire Score   Pre Score 24    Post Score 28             Core Components/Risk Factors/Patient Goals at Admission:  Personal Goals and Risk Factors at Admission - 02/12/24 1534       Core Components/Risk Factors/Patient Goals on Admission    Weight Management Yes;Weight Maintenance;Weight Loss    Intervention Weight Management: Develop a combined nutrition and exercise program designed to reach desired caloric intake, while maintaining appropriate intake of nutrient and fiber, sodium and fats, and appropriate energy expenditure required for the weight goal.;Weight Management: Provide education and appropriate resources to help participant work on and attain dietary goals.;Weight Management/Obesity: Establish reasonable short term and long term weight goals.    Admit Weight 161 lb 1.6 oz (73.1 kg)    Goal Weight: Short Term 156 lb (70.8 kg)    Goal Weight: Long Term 151 lb (68.5 kg)    Expected Outcomes Short Term: Continue to assess and modify interventions until short term weight is achieved;Long Term: Adherence to nutrition and physical activity/exercise program aimed toward attainment of established weight goal;Weight Maintenance: Understanding of the daily nutrition guidelines, which includes 25-35% calories from fat, 7% or less cal from saturated fats, less than 200mg  cholesterol, less than 1.5gm of  sodium, & 5 or more servings of fruits and vegetables daily;Weight Loss: Understanding of general recommendations for a balanced deficit meal plan, which promotes 1-2 lb weight loss per week and includes a negative energy balance of [id number] kcal/d;Understanding recommendations for meals to include 15-35% energy as protein, 25-35% energy from fat, 35-60% energy from carbohydrates, less than 200mg  of dietary cholesterol, 20-35 gm of total fiber daily;Understanding of distribution of calorie intake throughout the day with the consumption of 4-5 meals/snacks    Tobacco Cessation Yes    Number of packs per day .5    Intervention Assist the participant in steps to quit. Provide individualized education and counseling about  committing to Tobacco Cessation, relapse prevention, and pharmacological support that can be provided by physician.;Education officer, environmental, assist with locating and accessing local/national Quit Smoking programs, and support quit date choice.    Expected Outcomes Short Term: Will demonstrate readiness to quit, by selecting a quit date.;Short Term: Will quit all tobacco product use, adhering to prevention of relapse plan.;Long Term: Complete abstinence from all tobacco products for at least 12 months from quit date.    Improve shortness of breath with ADL's Yes    Intervention Provide education, individualized exercise plan and daily activity instruction to help decrease symptoms of SOB with activities of daily living.    Expected Outcomes Short Term: Improve cardiorespiratory fitness to achieve a reduction of symptoms when performing ADLs;Long Term: Be able to perform more ADLs without symptoms or delay the onset of symptoms    Heart Failure Yes    Intervention Provide a combined exercise and nutrition program that is supplemented with education, support and counseling about heart failure. Directed toward relieving symptoms such as shortness of breath, decreased exercise tolerance, and  extremity edema.    Expected Outcomes Improve functional capacity of life;Short term: Attendance in program 2-3 days a week with increased exercise capacity. Reported lower sodium intake. Reported increased fruit and vegetable intake. Reports medication compliance.;Short term: Daily weights obtained and reported for increase. Utilizing diuretic protocols set by physician.;Long term: Adoption of self-care skills and reduction of barriers for early signs and symptoms recognition and intervention leading to self-care maintenance.    Hypertension Yes    Intervention Provide education on lifestyle modifcations including regular physical activity/exercise, weight management, moderate sodium restriction and increased consumption of fresh fruit, vegetables, and low fat dairy, alcohol moderation, and smoking cessation.;Monitor prescription use compliance.    Expected Outcomes Short Term: Continued assessment and intervention until BP is < 140/85mm HG in hypertensive participants. < 130/68mm HG in hypertensive participants with diabetes, heart failure or chronic kidney disease.;Long Term: Maintenance of blood pressure at goal levels.    Lipids Yes    Intervention Provide education and support for participant on nutrition & aerobic/resistive exercise along with prescribed medications to achieve LDL 70mg , HDL >40mg .    Expected Outcomes Short Term: Participant states understanding of desired cholesterol values and is compliant with medications prescribed. Participant is following exercise prescription and nutrition guidelines.;Long Term: Cholesterol controlled with medications as prescribed, with individualized exercise RX and with personalized nutrition plan. Value goals: LDL < 70mg , HDL > 40 mg.             Core Components/Risk Factors/Patient Goals Review:   Goals and Risk Factor Review     Row Name 02/12/24 1548             Core Components/Risk Factors/Patient Goals Review   Personal Goals Review  Tobacco Cessation       Review Kyle Matthews is a current tobacco user. Intervention for tobacco cessation was provided at the initial medical review. He was asked about readiness to quit and reported not ready at the moment . Patient will be given at first appt education about tobacco cessation using combination therapy, tobacco cessation classes, quit line, and quit smoking apps. Patient demonstrated understanding of this material. Staff will continue to provide encouragement and follow up with the patient throughout the program.       Expected Outcomes Short: Review paperwork. Long: Tobacco Cessation.                Core Components/Risk Factors/Patient Goals at Discharge (  Final Review):   Goals and Risk Factor Review - 02/12/24 1548       Core Components/Risk Factors/Patient Goals Review   Personal Goals Review Tobacco Cessation    Review Kyle Matthews is a current tobacco user. Intervention for tobacco cessation was provided at the initial medical review. He was asked about readiness to quit and reported not ready at the moment . Patient will be given at first appt education about tobacco cessation using combination therapy, tobacco cessation classes, quit line, and quit smoking apps. Patient demonstrated understanding of this material. Staff will continue to provide encouragement and follow up with the patient throughout the program.    Expected Outcomes Short: Review paperwork. Long: Tobacco Cessation.             ITP Comments:  ITP Comments     Row Name 02/12/24 1526 02/14/24 0837         ITP Comments Patient attend orientation today.  Patient is attending Cardiac Rehabilitation Program.  Documentation for diagnosis can be found in [facility] 11/02/23.  Reviewed medical chart, RPE/RPD, gym safety, and program guidelines.  Patient was fitted to equipment they will be using during rehab.  Patient is scheduled to start exercise on 02/14/24.   Initial ITP created and sent for review and signature by Dr.  Dina Rich, Medical Director for Cardiac Rehabilitation Program. 30 day review completed. ITP sent to Dr. Dina Rich, Medical Director of Cardiac Rehab. Continue with ITP unless changes are made by physician.    Pt has just oriented to program this week.               Comments: 30 day review

## 2024-02-16 ENCOUNTER — Telehealth: Payer: Self-pay | Admitting: Internal Medicine

## 2024-02-16 DIAGNOSIS — E785 Hyperlipidemia, unspecified: Secondary | ICD-10-CM

## 2024-02-16 NOTE — Telephone Encounter (Signed)
 Pt c/o medication issue:  1. Name of Medication: ezetimibe (ZETIA) 10 MG tablet   2. How are you currently taking this medication (dosage and times per day)? As written  3. Are you having a reaction (difficulty breathing--STAT)? Yes, dizziness and fatigue   4. What is your medication issue? Feels like the medication is causing him dizziness and fatigue. Pt is stopping the medication and wants to know if there is something else he should take.

## 2024-02-16 NOTE — Telephone Encounter (Signed)
 Called pt regarding medication reaction. Pt answered and stated he was in the middle of something and would need to call back later.

## 2024-02-16 NOTE — Telephone Encounter (Signed)
 Pt returned call

## 2024-02-16 NOTE — Telephone Encounter (Signed)
 LVMTCB 2/28

## 2024-02-20 ENCOUNTER — Telehealth: Payer: Self-pay | Admitting: Internal Medicine

## 2024-02-20 DIAGNOSIS — R338 Other retention of urine: Secondary | ICD-10-CM | POA: Diagnosis not present

## 2024-02-20 NOTE — Telephone Encounter (Signed)
 Duplicate encounter

## 2024-02-20 NOTE — Addendum Note (Signed)
 Addended by: Lendon Ka on: 02/20/2024 04:47 PM   Modules accepted: Orders

## 2024-02-20 NOTE — Telephone Encounter (Signed)
 Spoke w patient.  Adv that Zetia added to allergy list as intolerance and per Dr. Lynnette Caffey we will arrange visit with PharmD for alternatives to get LDL <55.   Pt requests virtual visit if possible since he lives in [place].

## 2024-02-20 NOTE — Telephone Encounter (Signed)
 Patient called back stating he has not heard anything about his reaction to Zetia.   He states he had real bad dizziness and diarrhea. He stopped taking it and would like an alternate to help lower LDL

## 2024-02-20 NOTE — Telephone Encounter (Signed)
 Patient called to follow-up on next steps on replacing his ezetimibe (ZETIA) 10 MG tablet medication.

## 2024-02-21 ENCOUNTER — Encounter (HOSPITAL_COMMUNITY): Payer: Medicare Other

## 2024-02-21 NOTE — Addendum Note (Signed)
 Addended by: Lendon Ka on: 02/21/2024 04:32 PM   Modules accepted: Orders

## 2024-02-28 ENCOUNTER — Encounter (HOSPITAL_COMMUNITY)
Admission: RE | Admit: 2024-02-28 | Discharge: 2024-02-28 | Disposition: A | Discharge status: Home / Self Care | Payer: Medicare Other | Source: Ambulatory Visit | Attending: Internal Medicine | Admitting: Internal Medicine

## 2024-02-28 DIAGNOSIS — Z951 Presence of aortocoronary bypass graft: Secondary | ICD-10-CM | POA: Diagnosis not present

## 2024-02-28 DIAGNOSIS — Z952 Presence of prosthetic heart valve: Secondary | ICD-10-CM | POA: Insufficient documentation

## 2024-02-28 NOTE — Progress Notes (Signed)
 Daily Session Note  Patient Details  Name: Kyle Matthews MRN: [id number] Date of Birth: 12/23/1945 Referring Provider:   Flowsheet Row CARDIAC REHAB PHASE II ORIENTATION from 02/12/2024 in [facility] CARDIAC REH[facility]IL[facility]  Referring Provider Alverda Skeans. MD       Encounter Date: 02/28/2024  Check In:  Session Check In - 02/28/24 1041       Check-In   Supervising physician immediately available to respond to emergencies See telemetry face sheet for immediately available MD    Location [facility]    Staff Present Fabio Pierce, MA, RCEP, CCRP, CCET;Jakevion Arney Gaynell Face, Exercise Physiologist;Hillary Leonidas Romberg BSN, RN    Virtual Visit No    Medication changes reported     No    Fall or balance concerns reported    No    Tobacco Cessation No Change    Warm-up and Cool-down Performed on first and last piece of equipment    Resistance Training Performed Yes    VAD Patient? No    PAD/SET Patient? No      Pain Assessment   Currently in Pain? No/denies    Multiple Pain Sites No             Capillary Blood Glucose: No results found for this or any previous visit (from the past 24 hours).    Social History   Tobacco Use  Smoking Status Former   Current packs/day: 0.00   Average packs/day: 1.5 packs/day for 24.0 years (36.0 ttl pk-yrs)   Types: Cigarettes   Quit date: 08/21/2023   Years since quitting: 0.5  Smokeless Tobacco Never    Goals Met:  Independence with exercise equipment Exercise tolerated well No report of concerns or symptoms today Strength training completed today  Goals Unmet:  Not Applicable  Comments: Pt able to follow exercise prescription today without complaint.  Will continue to monitor for progression.

## 2024-03-01 ENCOUNTER — Other Ambulatory Visit: Payer: Self-pay | Admitting: Internal Medicine

## 2024-03-06 ENCOUNTER — Encounter (HOSPITAL_COMMUNITY): Payer: Medicare Other

## 2024-03-13 ENCOUNTER — Encounter (HOSPITAL_COMMUNITY): Payer: Self-pay | Admitting: *Deleted

## 2024-03-13 ENCOUNTER — Encounter (HOSPITAL_COMMUNITY): Payer: Medicare Other

## 2024-03-13 DIAGNOSIS — Z952 Presence of prosthetic heart valve: Secondary | ICD-10-CM

## 2024-03-13 DIAGNOSIS — Z951 Presence of aortocoronary bypass graft: Secondary | ICD-10-CM

## 2024-03-13 NOTE — Progress Notes (Signed)
 Discharge Progress Report  Patient Details  Name: Kyle Matthews MRN: [id number] Date of Birth: 1946/01/06 Referring Provider:   Flowsheet Row CARDIAC REHAB PHASE II ORIENTATION from 02/12/2024 in [facility] CARDIAC REHABILITATION  Referring Provider Alverda Skeans. MD        Number of Visits: 3  Reason for Discharge:  Early Exit:  Personal  Smoking History:  Social History   Tobacco Use  Smoking Status Former   Current packs/day: 0.00   Average packs/day: 1.5 packs/day for 24.0 years (36.0 ttl pk-yrs)   Types: Cigarettes   Quit date: 08/21/2023   Years since quitting: 0.5  Smokeless Tobacco Never    Diagnosis:  S/P CABG x 2  S/P AVR (aortic valve replacement)  ADL [facility]SD:   Initial Exercise Prescription:  Initial Exercise Prescription - 02/12/24 1500       Date of Initial Exercise RX and Referring Provider   Date 02/12/24    Referring Provider Alverda Skeans. MD      Oxygen   Maintain Oxygen Saturation 88% or higher      Treadmill   MPH 1.7    Grade 0.5    Minutes 15    METs 2.42      REL-XR   Level 3    Speed 50    Minutes 15    METs 2      Prescription Details   Frequency (times per week) 1    Duration Progress to 30 minutes of continuous aerobic without signs/symptoms of physical distress      Intensity   THRR 40-80% of Max Heartrate 111-134    Ratings of Perceived Exertion 11-13    Perceived Dyspnea 0-4      Progression   Progression Continue to progress workloads to maintain intensity without signs/symptoms of physical distress.      Resistance Training   Training Prescription Yes    Weight 4 lbs    Reps 10-15             Discharge Exercise Prescription (Final Exercise Prescription Changes):  Exercise Prescription Changes - 02/12/24 1500       Response to Exercise   Blood Pressure (Admit) 118/70    Blood Pressure (Exercise) 120/60    Blood Pressure (Exit) 124/62    Heart Rate (Admit) 88 bpm    Heart Rate (Exercise) 101 bpm     Heart Rate (Exit) 80 bpm    Oxygen Saturation (Admit) 93 %    Oxygen Saturation (Exercise) 97 %    Rating of Perceived Exertion (Exercise) 7    Perceived Dyspnea (Exercise) 0    Symptoms None    Comments results             Functional Capacity:  6 Minute Walk     Row Name 02/12/24 1541         6 Minute Walk   Phase Initial     Distance 1290 feet     Walk Time 6 minutes     # of Rest Breaks 0     MPH 2.44     METS 1.53     RPE 7     Perceived Dyspnea  0     VO2 Peak 5.37     Symptoms No     Resting HR 88 bpm     Resting BP 118/70     Resting Oxygen Saturation  93 %     Exercise Oxygen Saturation  during 6 min walk 97 %  Max Ex. HR 97 bpm     Max Ex. BP 120/60     2 Minute Post BP 124/62              Psychological, QOL, Others - Outcomes: PHQ 2/9:    02/12/2024    3:40 PM  Depression screen PHQ 2/9  Decreased Interest 1  Down, Depressed, Hopeless 2  PHQ - 2 Score 3  Altered sleeping 2  Tired, decreased energy 3  Change in appetite 0  Feeling bad or failure about yourself  2  Trouble concentrating 1  Moving slowly or fidgety/restless 0  Suicidal thoughts 0  PHQ-9 Score 11  Difficult doing work/chores Somewhat difficult    Quality of Life:  Quality of Life - 02/12/24 1538       Quality of Life   Select Quality of Life      Quality of Life Scores   Health/Function Pre 19.47 %    Socioeconomic Pre 23 %    Psych/Spiritual Pre 21.79 %    Family Pre 25.2 %    GLOBAL Pre 21.47 %              Nutrition & Weight - Outcomes:  Pre Biometrics - 02/12/24 1547       Pre Biometrics   Height 5\' 2"  (1.575 m)    Weight 160 lb 15 oz (73 kg)    Waist Circumference 36.5 inches    Hip Circumference 40.5 inches    Waist to Hip Ratio 0.9 %    BMI (Calculated) 29.43    Grip Strength 25.9 kg    Single Leg Stand 16.6 seconds            Education Questionnaire Score:  Knowledge Questionnaire Score - 02/12/24 1537       Knowledge  Questionnaire Score   Pre Score 24    Post Score 28

## 2024-03-13 NOTE — Progress Notes (Signed)
 Cardiac Individual Treatment Plan  Patient Details  Name: Kyle Matthews MRN: [id number] Date of Birth: Dec 29, 1945 Referring Provider:   Flowsheet Row CARDIAC REHAB PHASE II ORIENTATION from 02/12/2024 in [facility] CARDIAC REHABILITATION  Referring Provider Alverda Skeans. MD       Initial Encounter Date:  Flowsheet Row CARDIAC REHAB PHASE II ORIENTATION from 02/12/2024 in [place] CARDIAC REHABILITATION  Date 02/12/24       Visit Diagnosis: S/P CABG x 2  S/P AVR (aortic valve replacement)  Patient's Home Medications on Admission:  Current Outpatient Medications:    amLODipine (NORVASC) 5 MG tablet, Take 1 tablet (5 mg total) by mouth daily., Disp: 30 tablet, Rfl: 0   aspirin EC 81 MG tablet, Take 81 mg by mouth daily. Swallow whole., Disp: , Rfl:    Fe Fum-Vit C-Vit B12-FA (TRIGELS-F FORTE) CAPS capsule, Take 1 capsule by mouth daily. (Patient not taking: Reported on 02/12/2024), Disp: 30 capsule, Rfl: 3   finasteride (PROSCAR) 5 MG tablet, Take 5 mg by mouth every evening., Disp: , Rfl:    furosemide (LASIX) 20 MG tablet, TAKE 1 TABLET(20 MG) BY MOUTH DAILY AS NEEDED, Disp: 30 tablet, Rfl: 8   hyoscyamine (LEVSIN SL) 0.125 MG SL tablet, Place 2 tablets (0.25 mg total) under the tongue every 4 (four) hours as needed (Bladder spasms). (Patient not taking: Reported on 02/12/2024), Disp: 30 tablet, Rfl: 0   nitroGLYCERIN (NITROSTAT) 0.4 MG SL tablet, Place 1 tablet (0.4 mg total) under the tongue every 5 (five) minutes as needed for chest pain. (Patient not taking: Reported on 02/12/2024), Disp: 25 tablet, Rfl: 3   ondansetron (ZOFRAN-ODT) 4 MG disintegrating tablet, Take 1 tablet (4 mg total) by mouth every 8 (eight) hours as needed for nausea or vomiting. (Patient not taking: Reported on 02/12/2024), Disp: 12 tablet, Rfl: 0   oxyCODONE (OXY IR/ROXICODONE) 5 MG immediate release tablet, Take 1 tablet (5 mg total) by mouth every 4 (four) hours as needed for severe pain (pain score 7-10).  (Patient not taking: Reported on 02/12/2024), Disp: 30 tablet, Rfl: 0   pantoprazole (PROTONIX) 40 MG tablet, Take 1 tablet (40 mg total) by mouth daily. (Patient not taking: Reported on 02/12/2024), Disp: 30 tablet, Rfl: 11   rosuvastatin (CRESTOR) 40 MG tablet, Take 40 mg by mouth daily., Disp: , Rfl:   Past Medical History: Past Medical History:  Diagnosis Date   BPH (benign prostatic hyperplasia)    Status post biopsy in 2009. 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   Stroke (HCC) 2007   No true etiology noted    Tobacco Use: Social History   Tobacco Use  Smoking Status Former   Current packs/day: 0.00   Average packs/day: 1.5 packs/day for 24.0 years (36.0 ttl pk-yrs)   Types: Cigarettes   Quit date: 08/21/2023   Years since quitting: 0.5  Smokeless Tobacco Never    Labs: Review Flowsheet  More data exists      Latest Ref Rng & Units 10/04/2023 10/09/2023 10/23/2023 11/02/2023 01/29/2024  Labs for ITP Cardiac and Pulmonary Rehab  Cholestrol 100 - 199 mg/dL 403  - 474  - 259   LDL (calc) 0 - 99 mg/dL 54  - 55  - 80   HDL-C >39 mg/dL 49  - 52  -  62   Trlycerides 0 - 149 mg/dL 44  - 74  - 161   Hemoglobin A1c 4.8 - 5.6 % - - 4.7  5.2  -  PH, Arterial 7.35 - 7.45 - 7.426  - 7.326  7.365  7.299  7.268  7.303  7.378  7.366  7.429  7.372  7.47  -  PCO2 arterial 32 - 48 mmHg - 38.3  - 43.0  41.1  51.0  53.2  48.7  38.9  43.9  37.4  41.4  38  -  Bicarbonate 20.0 - 28.0 mmol/L - 26.8  26.5  25.2  - 22.4  23.4  25.1  24.7  24.1  22.9  25.1  24.5  24.7  24.1  27.7  -  TCO2 22 - 32 mmol/L - 28  28  26  24  24  25  27  26  25  26  24  26  25  26  26  26  26  25  26  25   -  Acid-base deficit 0.0 - 2.0 mmol/L - - - 3.0  2.0  2.0  3.0  2.0  2.0  1.0  -  O2 Saturation % - 63  73  92  - 97  96  95  88  100  100  100  84   100  100  98.6  -    Details       Multiple values from one day are sorted in reverse-chronological order         Capillary Blood Glucose: Lab Results  Component Value Date   GLUCAP 95 11/05/2023   GLUCAP 104 (H) 11/05/2023   GLUCAP 107 (H) 11/05/2023   GLUCAP 131 (H) 11/04/2023   GLUCAP 131 (H) 11/04/2023     Exercise Target Goals: Exercise Program Goal: Individual exercise prescription set using results from initial 6 min walk test and THRR while considering  patient's activity barriers and safety.   Exercise Prescription Goal: Starting with aerobic activity 30 plus minutes a day, 3 days per week for initial exercise prescription. Provide home exercise prescription and guidelines that participant acknowledges understanding prior to discharge.  Activity Barriers & Risk Stratification:  Activity Barriers & Cardiac Risk Stratification - 02/12/24 1424       Activity Barriers & Cardiac Risk Stratification   Activity Barriers Shortness of Breath;Muscular Weakness;Deconditioning    Cardiac Risk Stratification High             6 Minute Walk:  6 Minute Walk     Row Name 02/12/24 1541         6 Minute Walk   Phase Initial     Distance 1290 feet     Walk Time 6 minutes     # of Rest Breaks 0     MPH 2.44     METS 1.53     RPE 7     Perceived Dyspnea  0     VO2 Peak 5.37     Symptoms No     Resting HR 88 bpm     Resting BP 118/70     Resting Oxygen Saturation  93 %     Exercise Oxygen Saturation  during 6 min walk 97 %     Max Ex. HR 97 bpm     Max Ex. BP 120/60     2 Minute Post BP 124/62              Oxygen Initial  Assessment:   Oxygen Re-Evaluation:   Oxygen Discharge (Final Oxygen Re-Evaluation):   Initial Exercise Prescription:  Initial Exercise Prescription - 02/12/24 1500       Date of Initial Exercise RX and Referring Provider   Date 02/12/24    Referring Provider Alverda Skeans. MD      Oxygen   Maintain Oxygen Saturation 88%  or higher      Treadmill   MPH 1.7    Grade 0.5    Minutes 15    METs 2.42      REL-XR   Level 3    Speed 50    Minutes 15    METs 2      Prescription Details   Frequency (times per week) 1    Duration Progress to 30 minutes of continuous aerobic without signs/symptoms of physical distress      Intensity   THRR 40-80% of Max Heartrate 111-134    Ratings of Perceived Exertion 11-13    Perceived Dyspnea 0-4      Progression   Progression Continue to progress workloads to maintain intensity without signs/symptoms of physical distress.      Resistance Training   Training Prescription Yes    Weight 4 lbs    Reps 10-15             Perform Capillary Blood Glucose checks as needed.  Exercise Prescription Changes:   Exercise Prescription Changes     Row Name 02/12/24 1500             Response to Exercise   Blood Pressure (Admit) 118/70       Blood Pressure (Exercise) 120/60       Blood Pressure (Exit) 124/62       Heart Rate (Admit) 88 bpm       Heart Rate (Exercise) 101 bpm       Heart Rate (Exit) 80 bpm       Oxygen Saturation (Admit) 93 %       Oxygen Saturation (Exercise) 97 %       Rating of Perceived Exertion (Exercise) 7       Perceived Dyspnea (Exercise) 0       Symptoms None       Comments results                Exercise Comments:   Exercise Goals and Review:   Exercise Goals     Row Name 02/12/24 1546             Exercise Goals   Increase Physical Activity Yes       Intervention Provide advice, education, support and counseling about physical activity/exercise needs.;Develop an individualized exercise prescription for aerobic and resistive training based on initial evaluation findings, risk stratification, comorbidities and participant's personal goals.       Expected Outcomes Short Term: Attend rehab on a regular basis to increase amount of physical activity.;Long Term: Add in home exercise to make exercise part of routine  and to increase amount of physical activity.;Long Term: Exercising regularly at least 3-5 days a week.       Increase Strength and Stamina Yes       Intervention Provide advice, education, support and counseling about physical activity/exercise needs.;Develop an individualized exercise prescription for aerobic and resistive training based on initial evaluation findings, risk stratification, comorbidities and participant's personal goals.       Expected Outcomes Short Term: Increase workloads from initial exercise prescription for resistance, speed, and  METs.;Short Term: Perform resistance training exercises routinely during rehab and add in resistance training at home;Long Term: Improve cardiorespiratory fitness, muscular endurance and strength as measured by increased METs and functional capacity ( )       Able to understand and use rate of perceived exertion (RPE) scale Yes       Intervention Provide education and explanation on how to use RPE scale       Expected Outcomes Short Term: Able to use RPE daily in rehab to express subjective intensity level;Long Term:  Able to use RPE to guide intensity level when exercising independently       Able to understand and use Dyspnea scale Yes       Intervention Provide education and explanation on how to use Dyspnea scale       Expected Outcomes Short Term: Able to use Dyspnea scale daily in rehab to express subjective sense of shortness of breath during exertion;Long Term: Able to use Dyspnea scale to guide intensity level when exercising independently       Knowledge and understanding of Target Heart Rate Range (THRR) Yes       Intervention Provide education and explanation of THRR including how the numbers were predicted and where they are located for reference       Expected Outcomes Short Term: Able to state/look up THRR;Long Term: Able to use THRR to govern intensity when exercising independently;Short Term: Able to use daily as guideline for intensity  in rehab       Able to check pulse independently Yes       Intervention Provide education and demonstration on how to check pulse in carotid and radial arteries.;Review the importance of being able to check your own pulse for safety during independent exercise       Expected Outcomes Short Term: Able to explain why pulse checking is important during independent exercise;Long Term: Able to check pulse independently and accurately       Understanding of Exercise Prescription Yes       Intervention Provide education, explanation, and written materials on patient's individual exercise prescription       Expected Outcomes Short Term: Able to explain program exercise prescription;Long Term: Able to explain home exercise prescription to exercise independently                Exercise Goals Re-Evaluation :    Discharge Exercise Prescription (Final Exercise Prescription Changes):  Exercise Prescription Changes - 02/12/24 1500       Response to Exercise   Blood Pressure (Admit) 118/70    Blood Pressure (Exercise) 120/60    Blood Pressure (Exit) 124/62    Heart Rate (Admit) 88 bpm    Heart Rate (Exercise) 101 bpm    Heart Rate (Exit) 80 bpm    Oxygen Saturation (Admit) 93 %    Oxygen Saturation (Exercise) 97 %    Rating of Perceived Exertion (Exercise) 7    Perceived Dyspnea (Exercise) 0    Symptoms None    Comments results             Nutrition:  Target Goals: Understanding of nutrition guidelines, daily intake of sodium 1500mg , cholesterol 200mg , calories 30% from fat and 7% or less from saturated fats, daily to have 5 or more servings of fruits and vegetables.  Biometrics:  Pre Biometrics - 02/12/24 1547       Pre Biometrics   Height 5\' 2"  (1.575 m)    Weight 160 lb 15 oz (73  kg)    Waist Circumference 36.5 inches    Hip Circumference 40.5 inches    Waist to Hip Ratio 0.9 %    BMI (Calculated) 29.43    Grip Strength 25.9 kg    Single Leg Stand 16.6 seconds               Nutrition Therapy Plan and Nutrition Goals:  Nutrition Therapy & Goals - 02/12/24 1534       Intervention Plan   Intervention Prescribe, educate and counsel regarding individualized specific dietary modifications aiming towards targeted core components such as weight, hypertension, lipid management, diabetes, heart failure and other comorbidities.;Nutrition handout(s) given to patient.    Expected Outcomes Short Term Goal: Understand basic principles of dietary content, such as calories, fat, sodium, cholesterol and nutrients.;Long Term Goal: Adherence to prescribed nutrition plan.             Nutrition Assessments:  MEDIFICTS Score Key: >=70 Need to make dietary changes  40-70 Heart Healthy Diet <= 40 Therapeutic Level Cholesterol Diet  Flowsheet Row CARDIAC REHAB PHASE II ORIENTATION from 02/12/2024 in [facility] CARDIAC REHABILITATION  Picture Your Plate Total Score on Admission 54      Picture Your Plate Scores: <16 Unhealthy dietary pattern with much room for improvement. 41-50 Dietary pattern unlikely to meet recommendations for good health and room for improvement. 51-60 More healthful dietary pattern, with some room for improvement.  >60 Healthy dietary pattern, although there may be some specific behaviors that could be improved.    Nutrition Goals Re-Evaluation:   Nutrition Goals Discharge (Final Nutrition Goals Re-Evaluation):   Psychosocial: Target Goals: Acknowledge presence or absence of significant depression and/or stress, maximize coping skills, provide positive support system. Participant is able to verbalize types and ability to use techniques and skills needed for reducing stress and depression.  Initial Review & Psychosocial Screening:  Initial Psych Review & Screening - 02/12/24 1427       Initial Review   Current issues with Current Depression;Current Stress Concerns    Source of Stress Concerns Financial;Chronic Illness;Unable to  participate in former interests or hobbies;Unable to perform yard/household activities    Comments Stressed with bills coming in from the health care system      Family Dynamics   Good Support System? Yes      Barriers   Psychosocial barriers to participate in program There are no identifiable barriers or psychosocial needs.      Screening Interventions   Interventions Encouraged to exercise;To provide support and resources with identified psychosocial needs;Provide feedback about the scores to participant    Expected Outcomes Long Term Goal: Stressors or current issues are controlled or eliminated.;Short Term goal: Identification and review with participant of any Quality of Life or Depression concerns found by scoring the questionnaire.;Long Term goal: The participant improves quality of Life and PHQ9 Scores as seen by post scores and/or verbalization of changes             Quality of Life Scores:  Quality of Life - 02/12/24 1538       Quality of Life   Select Quality of Life      Quality of Life Scores   Health/Function Pre 19.47 %    Socioeconomic Pre 23 %    Psych/Spiritual Pre 21.79 %    Family Pre 25.2 %    GLOBAL Pre 21.47 %            Scores of 19 and below usually indicate a  poorer quality of life in these areas.  A difference of  2-3 points is a clinically meaningful difference.  A difference of 2-3 points in the total score of the Quality of Life Index has been associated with significant improvement in overall quality of life, self-image, physical symptoms, and general health in studies assessing change in quality of life.  PHQ-9: Review Flowsheet       02/12/2024  Depression screen PHQ 2/9  Decreased Interest 1  Down, Depressed, Hopeless 2  PHQ - 2 Score 3  Altered sleeping 2  Tired, decreased energy 3  Change in appetite 0  Feeling bad or failure about yourself  2  Trouble concentrating 1  Moving slowly or fidgety/restless 0  Suicidal thoughts 0   PHQ-9 Score 11  Difficult doing work/chores Somewhat difficult   Interpretation of Total Score  Total Score Depression Severity:  1-4 = Minimal depression, 5-9 = Mild depression, 10-14 = Moderate depression, 15-19 = Moderately severe depression, 20-27 = Severe depression   Psychosocial Evaluation and Intervention:  Psychosocial Evaluation - 02/12/24 1529       Psychosocial Evaluation & Interventions   Interventions Stress management education;Relaxation education;Encouraged to exercise with the program and follow exercise prescription    Comments Login is coming in today expressing moderate depression that is related mainly towards the election results. Expresses some concern over his financial status as well with the healthcare bills coming in. Regarding his health, he doesn't seem to depressed by it, just wants to be able to get his stamina back. His PHQ results indicates some depression, and he does have some sleep issues. His financial barriers is what is causing him to only come to rehab once a week.    Expected Outcomes Short: Come to rehab for atleast a month to be monitored. Long: To improve his stamina and breathing.    Continue Psychosocial Services  Follow up required by staff             Psychosocial Re-Evaluation:   Psychosocial Discharge (Final Psychosocial Re-Evaluation):   Vocational Rehabilitation: Provide vocational rehab assistance to qualifying candidates.   Vocational Rehab Evaluation & Intervention:  Vocational Rehab - 02/12/24 1429       Initial Vocational Rehab Evaluation & Intervention   Assessment shows need for Vocational Rehabilitation No   Retired            Education: Education Goals: Education classes will be provided on a weekly basis, covering required topics. Participant will state understanding/return demonstration of topics presented.  Learning Barriers/Preferences:  Learning Barriers/Preferences - 02/12/24 1426       Learning  Barriers/Preferences   Learning Barriers Sight   glasses   Learning Preferences Written Material;Verbal Instruction;Skilled Demonstration;Audio             Education Topics: Hypertension, Hypertension Reduction -Define heart disease and high blood pressure. Discus how high blood pressure affects the body and ways to reduce high blood pressure.   Exercise and Your Heart -Discuss why it is important to exercise, the FITT principles of exercise, normal and abnormal responses to exercise, and how to exercise safely.   Angina -Discuss definition of angina, causes of angina, treatment of angina, and how to decrease risk of having angina.   Cardiac Medications -Review what the following cardiac medications are used for, how they affect the body, and side effects that may occur when taking the medications.  Medications include Aspirin, Beta blockers, calcium channel blockers, ACE Inhibitors, angiotensin receptor blockers, diuretics, digoxin, and  antihyperlipidemics.   Congestive Heart Failure -Discuss the definition of CHF, how to live with CHF, the signs and symptoms of CHF, and how keep track of weight and sodium intake.   Heart Disease and Intimacy -Discus the effect sexual activity has on the heart, how changes occur during intimacy as we age, and safety during sexual activity.   Smoking Cessation / COPD -Discuss different methods to quit smoking, the health benefits of quitting smoking, and the definition of COPD.   Nutrition I: Fats -Discuss the types of cholesterol, what cholesterol does to the heart, and how cholesterol levels can be controlled.   Nutrition II: Labels -Discuss the different components of food labels and how to read food label   Heart Parts/Heart Disease and PAD -Discuss the anatomy of the heart, the pathway of blood circulation through the heart, and these are affected by heart disease.   Stress I: Signs and Symptoms -Discuss the causes of stress,  how stress may lead to anxiety and depression, and ways to limit stress. Flowsheet Row CARDIAC REHAB PHASE II EXERCISE from 02/28/2024 in [place] CARDIAC REHABILITATION  Date 02/28/24  Educator jh  Instruction Review Code 1- Verbalizes Understanding       Stress II: Relaxation -Discuss different types of relaxation techniques to limit stress.   Warning Signs of Stroke / TIA -Discuss definition of a stroke, what the signs and symptoms are of a stroke, and how to identify when someone is having stroke.   Knowledge Questionnaire Score:  Knowledge Questionnaire Score - 02/12/24 1537       Knowledge Questionnaire Score   Pre Score 24    Post Score 28             Core Components/Risk Factors/Patient Goals at Admission:  Personal Goals and Risk Factors at Admission - 02/12/24 1534       Core Components/Risk Factors/Patient Goals on Admission    Weight Management Yes;Weight Maintenance;Weight Loss    Intervention Weight Management: Develop a combined nutrition and exercise program designed to reach desired caloric intake, while maintaining appropriate intake of nutrient and fiber, sodium and fats, and appropriate energy expenditure required for the weight goal.;Weight Management: Provide education and appropriate resources to help participant work on and attain dietary goals.;Weight Management/Obesity: Establish reasonable short term and long term weight goals.    Admit Weight 161 lb 1.6 oz (73.1 kg)    Goal Weight: Short Term 156 lb (70.8 kg)    Goal Weight: Long Term 151 lb (68.5 kg)    Expected Outcomes Short Term: Continue to assess and modify interventions until short term weight is achieved;Long Term: Adherence to nutrition and physical activity/exercise program aimed toward attainment of established weight goal;Weight Maintenance: Understanding of the daily nutrition guidelines, which includes 25-35% calories from fat, 7% or less cal from saturated fats, less than 200mg   cholesterol, less than 1.5gm of sodium, & 5 or more servings of fruits and vegetables daily;Weight Loss: Understanding of general recommendations for a balanced deficit meal plan, which promotes 1-2 lb weight loss per week and includes a negative energy balance of [phone] kcal/d;Understanding recommendations for meals to include 15-35% energy as protein, 25-35% energy from fat, 35-60% energy from carbohydrates, less than 200mg  of dietary cholesterol, 20-35 gm of total fiber daily;Understanding of distribution of calorie intake throughout the day with the consumption of 4-5 meals/snacks    Tobacco Cessation Yes    Number of packs per day .5    Intervention Assist the participant in steps  to quit. Provide individualized education and counseling about committing to Tobacco Cessation, relapse prevention, and pharmacological support that can be provided by physician.;Education officer, environmental, assist with locating and accessing local/national Quit Smoking programs, and support quit date choice.    Expected Outcomes Short Term: Will demonstrate readiness to quit, by selecting a quit date.;Short Term: Will quit all tobacco product use, adhering to prevention of relapse plan.;Long Term: Complete abstinence from all tobacco products for at least 12 months from quit date.    Improve shortness of breath with ADL's Yes    Intervention Provide education, individualized exercise plan and daily activity instruction to help decrease symptoms of SOB with activities of daily living.    Expected Outcomes Short Term: Improve cardiorespiratory fitness to achieve a reduction of symptoms when performing ADLs;Long Term: Be able to perform more ADLs without symptoms or delay the onset of symptoms    Heart Failure Yes    Intervention Provide a combined exercise and nutrition program that is supplemented with education, support and counseling about heart failure. Directed toward relieving symptoms such as shortness of breath,  decreased exercise tolerance, and extremity edema.    Expected Outcomes Improve functional capacity of life;Short term: Attendance in program 2-3 days a week with increased exercise capacity. Reported lower sodium intake. Reported increased fruit and vegetable intake. Reports medication compliance.;Short term: Daily weights obtained and reported for increase. Utilizing diuretic protocols set by physician.;Long term: Adoption of self-care skills and reduction of barriers for early signs and symptoms recognition and intervention leading to self-care maintenance.    Hypertension Yes    Intervention Provide education on lifestyle modifcations including regular physical activity/exercise, weight management, moderate sodium restriction and increased consumption of fresh fruit, vegetables, and low fat dairy, alcohol moderation, and smoking cessation.;Monitor prescription use compliance.    Expected Outcomes Short Term: Continued assessment and intervention until BP is < 140/3mm HG in hypertensive participants. < 130/61mm HG in hypertensive participants with diabetes, heart failure or chronic kidney disease.;Long Term: Maintenance of blood pressure at goal levels.    Lipids Yes    Intervention Provide education and support for participant on nutrition & aerobic/resistive exercise along with prescribed medications to achieve LDL 70mg , HDL >40mg .    Expected Outcomes Short Term: Participant states understanding of desired cholesterol values and is compliant with medications prescribed. Participant is following exercise prescription and nutrition guidelines.;Long Term: Cholesterol controlled with medications as prescribed, with individualized exercise RX and with personalized nutrition plan. Value goals: LDL < 70mg , HDL > 40 mg.             Core Components/Risk Factors/Patient Goals Review:   Goals and Risk Factor Review     Row Name 02/12/24 1548             Core Components/Risk Factors/Patient Goals  Review   Personal Goals Review Tobacco Cessation       Review Kyle Matthews is a current tobacco user. Intervention for tobacco cessation was provided at the initial medical review. He was asked about readiness to quit and reported not ready at the moment . Patient will be given at first appt education about tobacco cessation using combination therapy, tobacco cessation classes, quit line, and quit smoking apps. Patient demonstrated understanding of this material. Staff will continue to provide encouragement and follow up with the patient throughout the program.       Expected Outcomes Short: Review paperwork. Long: Tobacco Cessation.  Core Components/Risk Factors/Patient Goals at Discharge (Final Review):   Goals and Risk Factor Review - 02/12/24 1548       Core Components/Risk Factors/Patient Goals Review   Personal Goals Review Tobacco Cessation    Review Kyle Matthews is a current tobacco user. Intervention for tobacco cessation was provided at the initial medical review. He was asked about readiness to quit and reported not ready at the moment . Patient will be given at first appt education about tobacco cessation using combination therapy, tobacco cessation classes, quit line, and quit smoking apps. Patient demonstrated understanding of this material. Staff will continue to provide encouragement and follow up with the patient throughout the program.    Expected Outcomes Short: Review paperwork. Long: Tobacco Cessation.             ITP Comments:  ITP Comments     Row Name 02/12/24 1526 02/14/24 0837 03/13/24 1010       ITP Comments Patient attend orientation today.  Patient is attending Cardiac Rehabilitation Program.  Documentation for diagnosis [facility] be found in [facility] 11/02/23.  Reviewed medical chart, RPE/RPD, gym safety, and program guidelines.  Patient was fitted to equipment they will be using during rehab.  Patient is scheduled to start exercise on 02/14/24.   Initial ITP created  and sent for review and signature by Dr. Dina Rich, Medical Director for Cardiac Rehabilitation Program. 30 day review completed. ITP sent to Dr. Dina Rich, Medical Director of Cardiac Rehab. Continue with ITP unless changes are made by physician.    Pt has just oriented to program this week. Jamarr called and left message that he would like to discharge from program.  He did not feel like it would work with his schedule and catheter.  We will discharge at this time.  He completed 3 sessions total.              Comments: Discharge ITP

## 2024-03-20 ENCOUNTER — Encounter (HOSPITAL_COMMUNITY): Payer: Medicare Other

## 2024-03-22 DIAGNOSIS — R338 Other retention of urine: Secondary | ICD-10-CM | POA: Diagnosis not present

## 2024-03-26 ENCOUNTER — Telehealth: Payer: Self-pay | Admitting: *Deleted

## 2024-03-26 ENCOUNTER — Other Ambulatory Visit: Payer: Self-pay | Admitting: Urology

## 2024-03-26 ENCOUNTER — Telehealth: Payer: Self-pay | Admitting: Internal Medicine

## 2024-03-26 NOTE — Telephone Encounter (Signed)
   Name: Kyle Matthews  DOB: [birth year]  MRN: [id number]  Primary Cardiologist: Maisie Fus, MD   Preoperative team, please contact this patient and set up a phone call appointment for further preoperative risk assessment. Please obtain consent and complete medication review. Thank you for your help.  I confirm that guidance regarding antiplatelet and oral anticoagulation therapy has been completed and, if necessary, noted below.  Per office protocol, if patient is without any new symptoms or concerns at the time of their virtual visit, he may hold Aspirin for 5-7 days prior to procedure. Please resume Aspirin as soon as possible postprocedure, at the discretion of the surgeon.    I also confirmed the patient resides in the state of [place]. As per [facility] Medical Board telemedicine laws, the patient must reside in the state in which the provider is licensed.   Denyce Robert, NP 03/26/2024, 2:24 PM [facility]

## 2024-03-26 NOTE — Telephone Encounter (Signed)
 Pt has been scheduled tele preop appt 05/03/24. Med rec and consent are done.      Patient Consent for Virtual Visit        Kyle Matthews has provided verbal consent on 03/26/2024 for a virtual visit (video or telephone).   CONSENT FOR VIRTUAL VISIT FOR:  Kyle Matthews  By participating in this virtual visit I agree to the following:  I hereby voluntarily request, consent and authorize [facility] and its employed or contracted physicians, physician assistants, nurse practitioners or other licensed health care professionals (the Practitioner), to provide me with telemedicine health care services (the "Services") as deemed necessary by the treating Practitioner. I acknowledge and consent to receive the Services by the Practitioner via telemedicine. I understand that the telemedicine visit will involve communicating with the Practitioner through live audiovisual communication technology and the disclosure of certain medical information by electronic transmission. I acknowledge that I have been given the opportunity to request an in-person assessment or other available alternative prior to the telemedicine visit and am voluntarily participating in the telemedicine visit.  I understand that I have the right to withhold or withdraw my consent to the use of telemedicine in the course of my care at any time, without affecting my right to future care or treatment, and that the Practitioner or I may terminate the telemedicine visit at any time. I understand that I have the right to inspect all information obtained and/or recorded in the course of the telemedicine visit and may receive copies of available information for a reasonable fee.  I understand that some of the potential risks of receiving the Services via telemedicine include:  Delay or interruption in medical evaluation due to technological equipment failure or disruption; Information transmitted may not be sufficient (e.g. poor resolution of  images) to allow for appropriate medical decision making by the Practitioner; and/or  In rare instances, security protocols could fail, causing a breach of personal health information.  Furthermore, I acknowledge that it is my responsibility to provide information about my medical history, conditions and care that is complete and accurate to the best of my ability. I acknowledge that Practitioner's advice, recommendations, and/or decision may be based on factors not within their control, such as incomplete or inaccurate data provided by me or distortions of diagnostic images or specimens that may result from electronic transmissions. I understand that the practice of medicine is not an exact science and that Practitioner makes no warranties or guarantees regarding treatment outcomes. I acknowledge that a copy of this consent can be made available to me via my patient portal [facility] MyChart), or I can request a printed copy by calling the office of [facility].    I understand that my insurance will be billed for this visit.   I have read or had this consent read to me. I understand the contents of this consent, which adequately explains the benefits and risks of the Services being provided via telemedicine.  I have been provided ample opportunity to ask questions regarding this consent and the Services and have had my questions answered to my satisfaction. I give my informed consent for the services to be provided through the use of telemedicine in my medical care

## 2024-03-26 NOTE — Telephone Encounter (Signed)
   Pre-operative Risk Assessment    Patient Name: Kyle Matthews  DOB: July 04, 1946 MRN: [id number]   Date of last office visit: 01/29/24 Date of next office visit: nothing currently    Request for Surgical Clearance    Procedure:   Robotic simple prostatectomy   Date of Surgery:  Clearance 05/22/24                                Surgeon:  Dr. Lucile Shutters Group or Practice Name:  [facility]  Phone number:  [phone] Fax number:  [phone]   Type of Clearance Requested:   - Medical  - Pharmacy:  Hold Aspirin     Type of Anesthesia:  General    Additional requests/questions:    SignedForest Gleason   03/26/2024, 2:00 PM

## 2024-03-26 NOTE — Telephone Encounter (Signed)
 Pt has been scheduled tele preop appt 05/03/24. Med rec and consent are done.

## 2024-03-27 ENCOUNTER — Encounter (HOSPITAL_COMMUNITY): Payer: Medicare Other

## 2024-04-03 ENCOUNTER — Encounter (HOSPITAL_COMMUNITY): Payer: Medicare Other

## 2024-04-08 DIAGNOSIS — J209 Acute bronchitis, unspecified: Secondary | ICD-10-CM | POA: Diagnosis not present

## 2024-04-08 DIAGNOSIS — Z953 Presence of xenogenic heart valve: Secondary | ICD-10-CM | POA: Diagnosis not present

## 2024-04-08 DIAGNOSIS — I10 Essential (primary) hypertension: Secondary | ICD-10-CM | POA: Diagnosis not present

## 2024-04-08 DIAGNOSIS — Z951 Presence of aortocoronary bypass graft: Secondary | ICD-10-CM | POA: Diagnosis not present

## 2024-04-10 ENCOUNTER — Encounter (HOSPITAL_COMMUNITY): Payer: Medicare Other

## 2024-04-17 ENCOUNTER — Encounter (HOSPITAL_COMMUNITY): Payer: Medicare Other

## 2024-04-22 DIAGNOSIS — R338 Other retention of urine: Secondary | ICD-10-CM | POA: Diagnosis not present

## 2024-05-03 ENCOUNTER — Ambulatory Visit: Attending: Cardiology | Admitting: Emergency Medicine

## 2024-05-03 DIAGNOSIS — Z01810 Encounter for preprocedural cardiovascular examination: Secondary | ICD-10-CM

## 2024-05-03 NOTE — Progress Notes (Signed)
 Virtual Visit via Telephone Note   Because of Derby Wittman co-morbid illnesses, he is at least at moderate risk for complications without adequate follow up.  This format is felt to be most appropriate for this patient at this time.  Due to technical limitations with video connection (technology), today's appointment will be conducted as an audio only telehealth visit, and [name] verbally agreed to proceed in this manner.   All issues noted in this document were discussed and addressed.  No physical exam could be performed with this format.  Evaluation Performed:  Preoperative cardiovascular risk assessment _____________   Date:  05/03/2024   Patient ID:  Kyle Matthews, DOB 14-Jan-1946, MRN [id number] Patient Location:  Home Provider location:   Office  Primary Care Provider:  Thukkani, Arun K, MD Primary Cardiologist:  Bridgette Campus, MD  Chief Complaint / Patient Profile   78 y.o. 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 Secundino Dach with [facility] and presents today for telephonic preoperative cardiovascular risk assessment.  History of Present Illness    Kyle Matthews is a 78 y.o. male who presents via audio/video conferencing for a telehealth visit today.  Pt was last seen in cardiology clinic on 01/29/2024 by Dr. Lorie Rook.  At that time Kyle Matthews was doing well.  The patient is now pending procedure as outlined above. Since his last visit, he denies chest pain, shortness of breath, lower extremity edema, fatigue, palpitations, melena, hematuria, hemoptysis, diaphoresis, weakness, presyncope, syncope, orthopnea, and PND.  Today patient is doing well overall.  He is without any acute cardiovascular concerns or complaints.  Notes that he does continue with a fairly active  lifestyle.  He does walk daily with his wife.  Was doing well at cardiac rehab until he had to stop due to discomfort with his chronic urinary catheter.  He is without any exertional symptoms.  He is able to achieve greater than 4 METS.  Past Medical History    Past Medical History:  Diagnosis Date   BPH (benign prostatic hyperplasia)    Status post biopsy in 2009. - w/ LUTS   Essential hypertension    Hyperlipidemia    On Crestor  - followed by PCP   Moderate aortic regurgitation 04/2014   Moderate AS and AR - on echocardiogram   Moderate calcific aortic stenosis 04/2017   Stroke (HCC) 2007   No true etiology noted   Past Surgical History:  Procedure Laterality Date   AORTIC VALVE REPLACEMENT N/A 11/02/2023   Procedure: AORTIC VALVE REPLACEMENT (AVR) USING 23 MM INSPIRIS RESILIA AORTIC VALVE;  Surgeon: Bartley Lightning, MD;  Location: [facility] OR;  Service: Open Heart Surgery;  Laterality: N/A;   CORONARY ARTERY BYPASS GRAFT N/A 11/02/2023   Procedure: CORONARY ARTERY BYPASS GRAFTING (CABG) X TWO USING LEFT INTERNAL MAMMARY ARTERY AND RIGHT GREATER SAPHENEOUS VEIN HARVESTED ENDOSCOPICLY;  Surgeon: Bartley Lightning, MD;  Location: [facility] OR;  Service: Open Heart Surgery;  Laterality: N/A;   CORONARY PRESSURE/FFR STUDY N/A 10/09/2023   Procedure: CORONARY PRESSURE/FFR STUDY;  Surgeon: Kyra Phy, MD;  Location: [facility] INVASIVE CV LAB;  Service: Cardiovascular;  Laterality: N/A;   IR ANGIOGRAM PELVIS SELECTIVE OR SUPRASELECTIVE  10/27/2023   IR ANGIOGRAM PELVIS SELECTIVE OR SUPRASELECTIVE  10/27/2023   IR ANGIOGRAM SELECTIVE EACH ADDITIONAL VESSEL  10/27/2023   IR ANGIOGRAM  SELECTIVE EACH ADDITIONAL VESSEL  10/27/2023   IR ANGIOGRAM SELECTIVE EACH ADDITIONAL VESSEL  10/27/2023   IR ANGIOGRAM SELECTIVE EACH ADDITIONAL VESSEL  10/27/2023   IR EMBO ARTERIAL NOT HEMORR HEMANG INC GUIDE ROADMAPPING  10/27/2023   IR US  GUIDE VASC ACCESS RIGHT  10/27/2023   RIGHT/LEFT HEART CATH AND CORONARY ANGIOGRAPHY N/A  10/09/2023   Procedure: RIGHT/LEFT HEART CATH AND CORONARY ANGIOGRAPHY;  Surgeon: Kyra Phy, MD;  Location: [facility] INVASIVE CV LAB;  Service: Cardiovascular;  Laterality: N/A;   TEE WITHOUT CARDIOVERSION N/A 11/02/2023   Procedure: TRANSESOPHAGEAL ECHOCARDIOGRAM;  Surgeon: Bartley Lightning, MD;  Location: [facility] OR;  Service: Open Heart Surgery;  Laterality: N/A;   TRANSTHORACIC ECHOCARDIOGRAM  04/28/2017    EF 65-70%. GR 1 DD. No regional wall motion abnormality. Moderate aortic stenosis with moderate regurgitation. Mean gradient 34 mmHg.    Allergies  Allergies  Allergen Reactions   Tetanus Toxoids     Childhood Allergy    Zetia  [Ezetimibe ] Diarrhea    Home Medications    Prior to Admission medications   Medication Sig Start Date End Date Taking? Authorizing Provider  amLODipine  (NORVASC ) 5 MG tablet Take 1 tablet (5 mg total) by mouth daily. 10/13/23   Amin, Ankit C, MD  aspirin  EC 81 MG tablet Take 81 mg by mouth daily. Swallow whole.    [provider]  Fe Fum-Vit C-Vit B12-FA (TRIGELS-F FORTE) CAPS capsule Take 1 capsule by mouth daily. Patient not taking: Reported on 03/26/2024 11/08/23   Barrett, Malachy Scripture, PA-C  finasteride  (PROSCAR ) 5 MG tablet Take 5 mg by mouth every evening.    [provider]  furosemide  (LASIX ) 20 MG tablet TAKE 1 TABLET(20 MG) BY MOUTH DAILY AS NEEDED 03/01/24   Thukkani, Arun K, MD  hyoscyamine  (LEVSIN SL) 0.125 MG SL tablet Place 2 tablets (0.25 mg total) under the tongue every 4 (four) hours as needed (Bladder spasms). Patient not taking: Reported on 03/26/2024 11/08/23   Barrett, Malachy Scripture, PA-C  nitroGLYCERIN  (NITROSTAT ) 0.4 MG SL tablet Place 1 tablet (0.4 mg total) under the tongue every 5 (five) minutes as needed for chest pain. Patient not taking: Reported on 02/12/2024 10/19/23   Thukkani, Arun K, MD  ondansetron  (ZOFRAN -ODT) 4 MG disintegrating tablet Take 1 tablet (4 mg total) by mouth every 8 (eight) hours as needed for nausea or  vomiting. Patient not taking: Reported on 02/12/2024 10/15/23   Editha Goring A, PA-C  oxyCODONE  (OXY IR/ROXICODONE ) 5 MG immediate release tablet Take 1 tablet (5 mg total) by mouth every 4 (four) hours as needed for severe pain (pain score 7-10). Patient not taking: Reported on 03/26/2024 11/08/23   Barrett, Malachy Scripture, PA-C  pantoprazole  (PROTONIX ) 40 MG tablet Take 1 tablet (40 mg total) by mouth daily. Patient not taking: Reported on 02/12/2024 10/19/23   Thukkani, Arun K, MD  rosuvastatin  (CRESTOR ) 40 MG tablet Take 40 mg by mouth daily.    [provider]    Physical Exam    Vital Signs:  Leander Valen does not have vital signs available for review today.  Given telephonic nature of communication, physical exam is limited. AAOx3. NAD. Normal affect.  Speech and respirations are unlabored.  Accessory Clinical Findings    None  Assessment & Plan    1.  Preoperative Cardiovascular Risk Assessment: According to the Revised Cardiac Risk Index (RCRI), his Perioperative Risk of Major Cardiac Event is (%): 6.6. His Functional Capacity in METs is: 5.07 according to the [facility]  Activity Status Index (DASI). Therefore, based on ACC/AHA guidelines, patient would be at acceptable risk for the planned procedure without further cardiovascular testing.   The patient was advised that if he develops new symptoms prior to surgery to contact our office to arrange for a follow-up visit, and he verbalized understanding.  He may hold Aspirin  for 5-7 days prior to procedure. Please resume Aspirin  as soon as possible postprocedure, at the discretion of the surgeon.    A copy of this note will be routed to requesting surgeon.  Time:   Today, I have spent 9 minutes with the patient with telehealth technology discussing medical history, symptoms, and management plan.     Ava Boatman, NP  05/03/2024, 9:18 AM

## 2024-05-07 DIAGNOSIS — R6883 Chills (without fever): Secondary | ICD-10-CM | POA: Diagnosis not present

## 2024-05-07 DIAGNOSIS — I771 Stricture of artery: Secondary | ICD-10-CM | POA: Diagnosis not present

## 2024-05-07 DIAGNOSIS — Z951 Presence of aortocoronary bypass graft: Secondary | ICD-10-CM | POA: Diagnosis not present

## 2024-05-07 DIAGNOSIS — R062 Wheezing: Secondary | ICD-10-CM | POA: Diagnosis not present

## 2024-05-07 DIAGNOSIS — Z720 Tobacco use: Secondary | ICD-10-CM | POA: Diagnosis not present

## 2024-05-09 ENCOUNTER — Other Ambulatory Visit: Payer: Self-pay | Admitting: Family Medicine

## 2024-05-09 ENCOUNTER — Encounter: Payer: Self-pay | Admitting: Family Medicine

## 2024-05-09 DIAGNOSIS — Z122 Encounter for screening for malignant neoplasm of respiratory organs: Secondary | ICD-10-CM

## 2024-05-09 DIAGNOSIS — Z720 Tobacco use: Secondary | ICD-10-CM

## 2024-05-10 NOTE — Patient Instructions (Signed)
 SURGICAL WAITING ROOM VISITATION  Patients having surgery or a procedure may have no more than 2 support people in the waiting area - these visitors may rotate.    Children under the age of 45 must have an adult with them who is not the patient.  Due to an increase in RSV and influenza rates and associated hospitalizations, children ages 94 and under may not visit patients in [facility] hospitals.  Visitors with respiratory illnesses are discouraged from visiting and should remain at home.  If the patient needs to stay at the hospital during part of their recovery, the visitor guidelines for inpatient rooms apply. Pre-op nurse will coordinate an appropriate time for 1 support person to accompany patient in pre-op.  This support person may not rotate.    Please refer to the [facility] website for the visitor guidelines for Inpatients (after your surgery is over and you are in a regular room).       Your procedure is scheduled on: 05/22/24   Report to [facility] Main Entrance    Report to admitting at : 9:45 AM   Call this number if you have problems the morning of surgery [phone]   Clear liquids starting with the prep, until : 9:00 AM DAY OF SURGERY  Water Non-Citrus Juices (without pulp, NO RED-Apple, White grape, White cranberry) Black Coffee (NO MILK/CREAM OR CREAMERS, sugar ok)  Clear Tea (NO MILK/CREAM OR CREAMERS, sugar ok) regular and decaf                             Plain Jell-O (NO RED)                                           Fruit ices (not with fruit pulp, NO RED)                                     Popsicles (NO RED)                                                               Sports drinks like Gatorade (NO RED)             FOLLOW BOWEL PREP AND ANY ADDITIONAL PRE OP INSTRUCTIONS YOU RECEIVED FROM YOUR SURGEON'S OFFICE!!!   Oral Hygiene is also important to reduce your risk of infection.                                    Remember - BRUSH YOUR  TEETH THE MORNING OF SURGERY WITH YOUR REGULAR TOOTHPASTE  DENTURES WILL BE REMOVED PRIOR TO SURGERY PLEASE DO NOT APPLY "Poly grip" OR ADHESIVES!!!   Do NOT smoke after Midnight   Stop all vitamins and herbal supplements 7 days before surgery.   Take these medicines the morning of surgery with A SIP OF WATER: amlodipine ,pantoprazole .Use inhalers as usual and bring them.  You may not have any metal on your body including hair pins, jewelry, and body piercing             Do not wear lotions, powders, perfumes/cologne, or deodorant              Men may shave face and neck.   Do not bring valuables to the hospital. [facility] IS NOT             RESPONSIBLE   FOR VALUABLES.   Contacts, glasses, dentures or bridgework may not be worn into surgery.   Bring small overnight bag day of surgery.   DO NOT BRING YOUR HOME MEDICATIONS TO THE HOSPITAL. PHARMACY WILL DISPENSE MEDICATIONS LISTED ON YOUR MEDICATION LIST TO YOU DURING YOUR ADMISSION IN THE HOSPITAL!    Patients discharged on the day of surgery will not be allowed to drive home.  Someone NEEDS to stay with you for the first 24 hours after anesthesia.   Special Instructions: Bring a copy of your healthcare power of attorney and living will documents the day of surgery if you haven't scanned them before.              Please read over the following fact sheets you were given: IF YOU HAVE QUESTIONS ABOUT YOUR PRE-OP INSTRUCTIONS PLEASE CALL [phone]   If you received a COVID test during your pre-op visit  it is requested that you wear a mask when out in public, stay away from anyone that may not be feeling well and notify your surgeon if you develop symptoms. If you test positive for Covid or have been in contact with anyone that has tested positive in the last 10 days please notify you surgeon.    [facility] - Preparing for Surgery Before surgery, you can play an important role.  Because skin is not  sterile, your skin needs to be as free of germs as possible.  You can reduce the number of germs on your skin by washing with CHG (chlorahexidine gluconate) soap before surgery.  CHG is an antiseptic cleaner which kills germs and bonds with the skin to continue killing germs even after washing. Please DO NOT use if you have an allergy to CHG or antibacterial soaps.  If your skin becomes reddened/irritated stop using the CHG and inform your nurse when you arrive at Short Stay. Do not shave (including legs and underarms) for at least 48 hours prior to the first CHG shower.  You may shave your face/neck. Please follow these instructions carefully:  1.  Shower with CHG Soap the night before surgery and the  morning of Surgery.  2.  If you choose to wash your hair, wash your hair first as usual with your  normal  shampoo.  3.  After you shampoo, rinse your hair and body thoroughly to remove the  shampoo.                           4.  Use CHG as you would any other liquid soap.  You can apply chg directly  to the skin and wash                       Gently with a scrungie or clean washcloth.  5.  Apply the CHG Soap to your body ONLY FROM THE NECK DOWN.   Do not use on face/ open  Wound or open sores. Avoid contact with eyes, ears mouth and genitals (private parts).                       Wash face,  Genitals (private parts) with your normal soap.             6.  Wash thoroughly, paying special attention to the area where your surgery  will be performed.  7.  Thoroughly rinse your body with warm water from the neck down.  8.  DO NOT shower/wash with your normal soap after using and rinsing off  the CHG Soap.                9.  Pat yourself dry with a clean towel.            10.  Wear clean pajamas.            11.  Place clean sheets on your bed the night of your first shower and do not  sleep with pets. Day of Surgery : Do not apply any lotions/deodorants the morning of surgery.   Please wear clean clothes to the hospital/surgery center.  FAILURE TO FOLLOW THESE INSTRUCTIONS MAY RESULT IN THE CANCELLATION OF YOUR SURGERY PATIENT SIGNATURE_________________________________  NURSE SIGNATURE__________________________________  ________________________________________________________________________

## 2024-05-14 ENCOUNTER — Encounter (HOSPITAL_COMMUNITY)
Admission: RE | Admit: 2024-05-14 | Discharge: 2024-05-14 | Disposition: A | Discharge status: Home / Self Care | Source: Ambulatory Visit | Attending: Urology | Admitting: Urology

## 2024-05-14 ENCOUNTER — Encounter (HOSPITAL_COMMUNITY): Payer: Self-pay

## 2024-05-14 ENCOUNTER — Other Ambulatory Visit: Payer: Self-pay

## 2024-05-14 VITALS — BP 139/77 | HR 100 | Temp 98.4°F | Ht 62.0 in | Wt 158.0 lb

## 2024-05-14 DIAGNOSIS — N401 Enlarged prostate with lower urinary tract symptoms: Secondary | ICD-10-CM | POA: Insufficient documentation

## 2024-05-14 DIAGNOSIS — Z7982 Long term (current) use of aspirin: Secondary | ICD-10-CM | POA: Insufficient documentation

## 2024-05-14 DIAGNOSIS — I10 Essential (primary) hypertension: Secondary | ICD-10-CM | POA: Insufficient documentation

## 2024-05-14 DIAGNOSIS — Z951 Presence of aortocoronary bypass graft: Secondary | ICD-10-CM | POA: Diagnosis not present

## 2024-05-14 DIAGNOSIS — Z8673 Personal history of transient ischemic attack (TIA), and cerebral infarction without residual deficits: Secondary | ICD-10-CM | POA: Insufficient documentation

## 2024-05-14 DIAGNOSIS — R8271 Bacteriuria: Secondary | ICD-10-CM | POA: Diagnosis not present

## 2024-05-14 DIAGNOSIS — Z01812 Encounter for preprocedural laboratory examination: Secondary | ICD-10-CM | POA: Diagnosis not present

## 2024-05-14 DIAGNOSIS — Z01818 Encounter for other preprocedural examination: Secondary | ICD-10-CM | POA: Diagnosis present

## 2024-05-14 DIAGNOSIS — Z952 Presence of prosthetic heart valve: Secondary | ICD-10-CM | POA: Diagnosis not present

## 2024-05-14 DIAGNOSIS — I2510 Atherosclerotic heart disease of native coronary artery without angina pectoris: Secondary | ICD-10-CM | POA: Insufficient documentation

## 2024-05-14 DIAGNOSIS — R338 Other retention of urine: Secondary | ICD-10-CM | POA: Diagnosis not present

## 2024-05-14 HISTORY — DX: Atherosclerotic heart disease of native coronary artery without angina pectoris: I25.10

## 2024-05-14 HISTORY — DX: Unspecified osteoarthritis, unspecified site: M19.90

## 2024-05-14 HISTORY — DX: Urinary tract infection, site not specified: N39.0

## 2024-05-14 HISTORY — DX: Anemia, unspecified: D64.9

## 2024-05-14 HISTORY — DX: Anxiety disorder, unspecified: F41.9

## 2024-05-14 HISTORY — DX: Depression, unspecified: F32.A

## 2024-05-14 LAB — BASIC METABOLIC PANEL WITH GFR
Anion gap: 12 (ref 5–15)
BUN: 13 mg/dL (ref 8–23)
CO2: 22 mmol/L (ref 22–32)
Calcium: 8.9 mg/dL (ref 8.9–10.3)
Chloride: 99 mmol/L (ref 98–111)
Creatinine, Ser: 0.87 mg/dL (ref 0.61–1.24)
GFR, Estimated: >60 mL/min (ref >60)
Glucose, Bld: 147 mg/dL — ABNORMAL HIGH (ref 70–99)
Potassium: 3 mmol/L — ABNORMAL LOW (ref 3.5–5.1)
Sodium: 133 mmol/L — ABNORMAL LOW (ref 135–145)

## 2024-05-14 LAB — CBC
HCT: 38.3 % — ABNORMAL LOW (ref 39.0–52.0)
Hemoglobin: 13 g/dL (ref 13.0–17.0)
MCH: 31.3 pg (ref 26.0–34.0)
MCHC: 33.9 g/dL (ref 30.0–36.0)
MCV: 92.3 fL (ref 80.0–100.0)
Platelets: 149 10*3/uL — ABNORMAL LOW (ref 150–400)
RBC: 4.15 MIL/uL — ABNORMAL LOW (ref 4.22–5.81)
RDW: 13.2 % (ref 11.5–15.5)
WBC: 11.8 10*3/uL — ABNORMAL HIGH (ref 4.0–10.5)
nRBC: 0 % (ref 0.0–0.2)

## 2024-05-14 NOTE — Progress Notes (Signed)
 Lab. Results:  Potassium 3.0 Low

## 2024-05-14 NOTE — Progress Notes (Addendum)
 For Anesthesia: PCP - Olin Bertin, MD  Cardiologist - Bridgette Campus, MD  Clearance:Fountain, Shauna Del, NP : 05/03/24  Bowel Prep reminder:  Chest x-ray - 12/05/23. 05/07/24 EKG - 11/24/23 Stress Test -  ECHO - 12/14/23 Cardiac Cath - 10/09/23 Pacemaker/ICD device last checked: Pacemaker orders received: Device Rep notified:  Spinal Cord Stimulator:N/A  Sleep Study - N/A CPAP -   Fasting Blood Sugar -  Checks Blood Sugar _____ times a day Date and result of last Hgb A1c-  Last dose of GLP1 agonist- N/A GLP1 instructions:   Last dose of SGLT-2 inhibitors- N/A SGLT-2 instructions:   Blood Thinner Instructions: Aspirin  Instructions: To hold it 5-7 days before surgery. Last Dose:  Activity level: Can go up a flight of stairs and activities of daily living without stopping and without chest pain and/or shortness of breath   Able to exercise without chest pain and/or shortness of breath  Anesthesia review: Hx: HTN,CAD,Stroke,CABG x 2,Right BBB,smoker. Pt. Still Wheezing, he was advised to call MD. And let her know about it.  Patient denies shortness of breath, fever, cough and chest pain at PAT appointment   Patient verbalized understanding of instructions that were given to them at the PAT appointment. Patient was also instructed that they will need to review over the PAT instructions again at home before surgery.

## 2024-05-15 NOTE — Anesthesia Preprocedure Evaluation (Addendum)
 Anesthesia Evaluation  Patient identified by MRN, date of birth, ID band Patient awake    Reviewed: Allergy & Precautions, NPO status , Patient's Chart, lab work & pertinent test results, reviewed documented beta blocker date and time   Airway Mallampati: II  TM Distance: >3 FB     Dental no notable dental hx. (+) Dental Advisory Given, Teeth Intact   Pulmonary Current Smoker and Patient abstained from smoking.   Pulmonary exam normal breath sounds clear to auscultation       Cardiovascular hypertension, Pt. on medications + angina with exertion + CAD, + Past MI, + CABG and +CHF  Normal cardiovascular exam+ dysrhythmias Atrial Fibrillation  Rhythm:Regular Rate:Normal  S/P CABG + AVR  EKG 11/24/23 Normal sinus rhythm Left axis deviation Right bundle branch block Minimal voltage criteria for LVH, may be normal variant ( R in aVL )  Echo 12/14/23 1. Left ventricular ejection fraction, by estimation, is 60 to 65%. The  left ventricle has normal function. The left ventricle has no regional  wall motion abnormalities. There is mild concentric left ventricular  hypertrophy. Left ventricular diastolic  parameters are consistent with Grade I diastolic dysfunction (impaired  relaxation).   2. Right ventricular systolic function is normal. The right ventricular  size is normal. There is normal pulmonary artery systolic pressure. The  estimated right ventricular systolic pressure is 26.4 mmHg.   3. Left atrial size was moderately dilated.   4. Right atrial size was mild to moderately dilated.   5. The mitral valve is degenerative. Mild mitral valve regurgitation.  Moderate mitral annular calcification.   6. The aortic valve has been repaired/replaced. Aortic valve  regurgitation is trivial. There is a 23 mm Inspiris Resilia valve present  in the aortic position. Aortic valve mean gradient measures 12.0 mmHg.  Dimentionless index 0.55.    7. The inferior vena cava is normal in size with greater than 50%  respiratory variability, suggesting right atrial pressure of 3 mmHg.   Comparison(s): Prior images reviewed side by side. Interval AVR noted with  grossly normal function and trivial aortic regurgitation.    Cardiac Cath 10/09/23   Ost LM lesion is 50% stenosed.   1.  Moderate left main stenosis with an RFR of 0.83. 2.  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 3.  Fick cardiac output of 6.5 L/min and Fick cardiac index of 3.8 L/min/m with the following hemodynamics:             Right atrial pressure mean of 7             Right ventricular pressure 42/-2 with an end-diastolic pressure of 8 mmHg             Wedge pressure mean of 14 mmHg with V waves to 23 mmHg             PA pressure of 38/16 with a mean of 24 mmHg             PVR of 1.5 Woods units             PA pulsatility index of 3.1   Recommendation: Continue evaluation for aortic valve intervention.  Will obtain TAVR protocol CTA tomorrow.    Neuro/Psych  PSYCHIATRIC DISORDERS Anxiety Depression    No residual effects CVA 2007 CVA, No Residual Symptoms    GI/Hepatic Neg liver ROS,GERD  Medicated,,  Endo/Other  Hyperlipidemia  Renal/GU Renal diseaseLeft renal lesion   Bladder outlet obstruction BPH  Musculoskeletal  (+) Arthritis , Osteoarthritis,    Abdominal   Peds  Hematology  (+) Blood dyscrasia, anemia   Anesthesia Other Findings   Reproductive/Obstetrics                             Anesthesia Physical Anesthesia Plan  ASA: 3  Anesthesia Plan: General   Post-op Pain Management: Dilaudid  IV, Precedex  and Ofirmev  IV (intra-op)*   Induction: Intravenous  PONV Risk Score and Plan: 3 and Treatment may vary due to age or medical condition, Ondansetron  and Dexamethasone  Airway Management Planned: LMA and Oral ETT  Additional Equipment: None  Intra-op Plan:   Post-operative Plan:  Extubation in OR  Informed Consent: I have reviewed the patients History and Physical, chart, labs and discussed the procedure including the risks, benefits and alternatives for the proposed anesthesia with the patient or authorized representative who has indicated his/her understanding and acceptance.     Dental advisory given  Plan Discussed with: CRNA and Anesthesiologist  Anesthesia Plan Comments: (See PAT note 05/14/2024)       Anesthesia Quick Evaluation

## 2024-05-15 NOTE — Progress Notes (Signed)
 Anesthesia Chart Review   Case: [id number] Date/Time: 05/22/24 1145   Procedure: PROSTATECTOMY, SIMPLE, ROBOT-ASSISTED   Anesthesia type: General   Diagnosis: Enlarged prostate with urinary retention [N40.1, R33.8]   Pre-op diagnosis: LARGE PROSTATE WITH RETENTION   Location: [facility] ROOM 03 / [facility]   Surgeons: Melody Spurling., MD       DISCUSSION:77 y.o. smoker with h/o HTN, stroke, CAD s/p CABG, s/p AV replacement, BPH with retention scheduled for above procedure 05/22/2024 with Dr. Osborn Blaze.   Per cardiology preoperative evaluation 05/03/2024, "According to the Revised Cardiac Risk Index (RCRI), his Perioperative Risk of Major Cardiac Event is (%): 6.6. His Functional Capacity in METs is: 5.07 according to the Duke Activity Status Index (DASI). Therefore, based on ACC/AHA guidelines, patient would be at acceptable risk for the planned procedure without further cardiovascular testing.    The patient was advised that if he develops new symptoms prior to surgery to contact our office to arrange for a follow-up visit, and he verbalized understanding.   He may hold Aspirin  for 5-7 days prior to procedure. Please resume Aspirin  as soon as possible postprocedure, at the discretion of the surgeon.  "  VS: BP 139/77   Pulse 100   Temp 36.9 C (Oral)   Ht 5\' 2"  (1.575 m)   Wt 71.7 kg   SpO2 96%   BMI 28.90 kg/m   PROVIDERS: Olin Bertin, MD is PCP   Cardiologist - Amanda Jungling Tomas Fountain, MD  LABS: Labs reviewed: Acceptable for surgery. (all labs ordered are listed, but only abnormal results are displayed)  Labs Reviewed  BASIC METABOLIC PANEL WITH GFR - Abnormal; Notable for the following components:      Result Value   Sodium 133 (*)    Potassium 3.0 (*)    Glucose, Bld 147 (*)    All other components within normal limits  CBC - Abnormal; Notable for the following components:   WBC 11.8 (*)    RBC 4.15 (*)    HCT 38.3 (*)    Platelets 149 (*)    All other components within  normal limits  TYPE AND SCREEN     IMAGES:   EKG:   CV: Echo 12/14/2023 1. Left ventricular ejection fraction, by estimation, is 60 to 65%. The  left ventricle has normal function. The left ventricle has no regional  wall motion abnormalities. There is mild concentric left ventricular  hypertrophy. Left ventricular diastolic  parameters are consistent with Grade I diastolic dysfunction (impaired  relaxation).   2. Right ventricular systolic function is normal. The right ventricular  size is normal. There is normal pulmonary artery systolic pressure. The  estimated right ventricular systolic pressure is 26.4 mmHg.   3. Left atrial size was moderately dilated.   4. Right atrial size was mild to moderately dilated.   5. The mitral valve is degenerative. Mild mitral valve regurgitation.  Moderate mitral annular calcification.   6. The aortic valve has been repaired/replaced. Aortic valve  regurgitation is trivial. There is a 23 mm Inspiris Resilia valve present  in the aortic position. Aortic valve mean gradient measures 12.0 mmHg.  Dimentionless index 0.55.   7. The inferior vena cava is normal in size with greater than 50%  respiratory variability, suggesting right atrial pressure of 3 mmHg.   Past Medical History:  Diagnosis Date   Anemia    Anxiety    Arthritis    BPH (benign prostatic hyperplasia)    Status post biopsy  in 2009. 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   Stroke (HCC) 2007   No true etiology noted   UTI (urinary tract infection)     Past Surgical History:  Procedure Laterality Date   AORTIC VALVE REPLACEMENT N/A 11/02/2023   Procedure: AORTIC VALVE REPLACEMENT (AVR) USING 23 MM INSPIRIS RESILIA AORTIC VALVE;  Surgeon: Bartley Lightning, MD;   Location: [facility];  Service: Open Heart Surgery;  Laterality: N/A;   CORONARY ARTERY BYPASS GRAFT N/A 11/02/2023   Procedure: CORONARY ARTERY BYPASS GRAFTING (CABG) X TWO USING LEFT INTERNAL MAMMARY ARTERY AND RIGHT GREATER SAPHENEOUS VEIN HARVESTED ENDOSCOPICLY;  Surgeon: Bartley Lightning, MD;  Location: [facility];  Service: Open Heart Surgery;  Laterality: N/A;   CORONARY PRESSURE/FFR STUDY N/A 10/09/2023   Procedure: CORONARY PRESSURE/FFR STUDY;  Surgeon: Kyra Phy, MD;  Location: [facility];  Service: Cardiovascular;  Laterality: N/A;   IR ANGIOGRAM PELVIS SELECTIVE OR SUPRASELECTIVE  10/27/2023   IR ANGIOGRAM PELVIS SELECTIVE OR SUPRASELECTIVE  10/27/2023   IR ANGIOGRAM SELECTIVE EACH ADDITIONAL VESSEL  10/27/2023   IR ANGIOGRAM SELECTIVE EACH ADDITIONAL VESSEL  10/27/2023   IR ANGIOGRAM SELECTIVE EACH ADDITIONAL VESSEL  10/27/2023   IR ANGIOGRAM SELECTIVE EACH ADDITIONAL VESSEL  10/27/2023   IR EMBO ARTERIAL NOT HEMORR HEMANG INC GUIDE ROADMAPPING  10/27/2023   IR US  GUIDE VASC ACCESS RIGHT  10/27/2023   RIGHT/LEFT HEART CATH AND CORONARY ANGIOGRAPHY N/A 10/09/2023   Procedure: RIGHT/LEFT HEART CATH AND CORONARY ANGIOGRAPHY;  Surgeon: Kyra Phy, MD;  [facility];  Service: Cardiovascular;  Laterality: N/A;   TEE WITHOUT CARDIOVERSION N/A 11/02/2023   Procedure: TRANSESOPHAGEAL ECHOCARDIOGRAM;  Surgeon: Bartley Lightning, MD;  Location: [facility] OR;  Service: Open Heart Surgery;  Laterality: N/A;   TRANSTHORACIC ECHOCARDIOGRAM  04/28/2017    EF 65-70%. GR 1 DD. No regional wall motion abnormality. Moderate aortic stenosis with moderate regurgitation. Mean gradient 34 mmHg.    MEDICATIONS:  albuterol  (VENTOLIN  HFA) 108 (90 Base) MCG/ACT inhaler   amLODipine  (NORVASC ) 5 MG tablet   aspirin  EC 81 MG tablet   Ferrous Gluconate (IRON  27 PO)   finasteride  (PROSCAR ) 5 MG tablet   furosemide  (LASIX ) 20 MG tablet   nitroGLYCERIN  (NITROSTAT ) 0.4 MG SL tablet   pantoprazole   (PROTONIX ) 40 MG tablet   rosuvastatin  (CRESTOR ) 40 MG tablet   No current facility-administered medications for this encounter.   Chick Cotton Ward, PA-C [facility] [phone]

## 2024-05-22 ENCOUNTER — Encounter (HOSPITAL_COMMUNITY)
Admission: RE | Disposition: A | Discharge status: Home / Self Care | Payer: Self-pay | Source: Ambulatory Visit | Attending: Urology

## 2024-05-22 ENCOUNTER — Ambulatory Visit (HOSPITAL_COMMUNITY): Payer: Self-pay | Admitting: Physician Assistant

## 2024-05-22 ENCOUNTER — Encounter (HOSPITAL_COMMUNITY): Payer: Self-pay | Admitting: Urology

## 2024-05-22 ENCOUNTER — Other Ambulatory Visit: Payer: Self-pay

## 2024-05-22 ENCOUNTER — Ambulatory Visit (HOSPITAL_COMMUNITY): Admitting: Anesthesiology

## 2024-05-22 ENCOUNTER — Observation Stay (HOSPITAL_COMMUNITY)
Admission: RE | Admit: 2024-05-22 | Discharge: 2024-05-23 | Disposition: A | Discharge status: Home / Self Care | Source: Ambulatory Visit | Attending: Urology | Admitting: Urology

## 2024-05-22 DIAGNOSIS — R338 Other retention of urine: Secondary | ICD-10-CM | POA: Diagnosis not present

## 2024-05-22 DIAGNOSIS — N179 Acute kidney failure, unspecified: Secondary | ICD-10-CM | POA: Diagnosis not present

## 2024-05-22 DIAGNOSIS — N401 Enlarged prostate with lower urinary tract symptoms: Secondary | ICD-10-CM

## 2024-05-22 DIAGNOSIS — R339 Retention of urine, unspecified: Secondary | ICD-10-CM | POA: Insufficient documentation

## 2024-05-22 DIAGNOSIS — Z8673 Personal history of transient ischemic attack (TIA), and cerebral infarction without residual deficits: Secondary | ICD-10-CM | POA: Diagnosis not present

## 2024-05-22 DIAGNOSIS — I5043 Acute on chronic combined systolic (congestive) and diastolic (congestive) heart failure: Secondary | ICD-10-CM | POA: Insufficient documentation

## 2024-05-22 DIAGNOSIS — Z951 Presence of aortocoronary bypass graft: Secondary | ICD-10-CM | POA: Insufficient documentation

## 2024-05-22 DIAGNOSIS — I639 Cerebral infarction, unspecified: Secondary | ICD-10-CM | POA: Diagnosis not present

## 2024-05-22 DIAGNOSIS — I2510 Atherosclerotic heart disease of native coronary artery without angina pectoris: Secondary | ICD-10-CM

## 2024-05-22 DIAGNOSIS — N139 Obstructive and reflux uropathy, unspecified: Secondary | ICD-10-CM | POA: Insufficient documentation

## 2024-05-22 DIAGNOSIS — I110 Hypertensive heart disease with heart failure: Secondary | ICD-10-CM | POA: Diagnosis not present

## 2024-05-22 DIAGNOSIS — F17210 Nicotine dependence, cigarettes, uncomplicated: Secondary | ICD-10-CM | POA: Insufficient documentation

## 2024-05-22 DIAGNOSIS — I5033 Acute on chronic diastolic (congestive) heart failure: Secondary | ICD-10-CM | POA: Diagnosis not present

## 2024-05-22 DIAGNOSIS — N138 Other obstructive and reflux uropathy: Principal | ICD-10-CM | POA: Diagnosis present

## 2024-05-22 HISTORY — PX: XI ROBOTIC ASSISTED SIMPLE PROSTATECTOMY: SHX6713

## 2024-05-22 LAB — HEMOGLOBIN AND HEMATOCRIT, BLOOD
HCT: 36.9 % — ABNORMAL LOW (ref 39.0–52.0)
Hemoglobin: 12.3 g/dL — ABNORMAL LOW (ref 13.0–17.0)

## 2024-05-22 LAB — TYPE AND SCREEN
ABO/RH(D): AB POS
Antibody Screen: NEGATIVE

## 2024-05-22 SURGERY — PROSTATECTOMY, SIMPLE, ROBOT-ASSISTED
Anesthesia: General | Site: Abdomen

## 2024-05-22 MED ORDER — ROCURONIUM BROMIDE 100 MG/10ML IV SOLN
INTRAVENOUS | Status: DC | PRN
Start: 2024-05-22 — End: 2024-05-22
  Administered 2024-05-22: 60 mg via INTRAVENOUS
  Administered 2024-05-22: 20 mg via INTRAVENOUS

## 2024-05-22 MED ORDER — ONDANSETRON HCL 4 MG/2ML IJ SOLN: 4.0000 mg | INTRAMUSCULAR | Status: DC | PRN

## 2024-05-22 MED ORDER — AMLODIPINE BESYLATE 5 MG PO TABS
5.0000 mg | ORAL_TABLET | Freq: Every day | ORAL | Status: DC
  Administered 2024-05-23: 5 mg via ORAL
  Filled 2024-05-22: qty 1

## 2024-05-22 MED ORDER — LACTATED RINGERS IR SOLN
Status: DC | PRN
Start: 2024-05-22 — End: 2024-05-22
  Administered 2024-05-22: 1000 mL

## 2024-05-22 MED ORDER — OXYCODONE HCL 5 MG PO TABS: 5.0000 mg | ORAL_TABLET | Freq: Once | ORAL | Status: DC | PRN

## 2024-05-22 MED ORDER — PROPOFOL 10 MG/ML IV BOLUS
INTRAVENOUS | Status: DC | PRN
  Administered 2024-05-22: 130 mg via INTRAVENOUS

## 2024-05-22 MED ORDER — PHENYLEPHRINE HCL (PRESSORS) 10 MG/ML IV SOLN
INTRAVENOUS | Status: AC
  Filled 2024-05-22: qty 1

## 2024-05-22 MED ORDER — DOCUSATE SODIUM 100 MG PO CAPS: 100.0000 mg | ORAL_CAPSULE | Freq: Two times a day (BID) | ORAL | Status: DC

## 2024-05-22 MED ORDER — SODIUM CHLORIDE 0.9 % IV BOLUS
1000.0000 mL | Freq: Once | INTRAVENOUS | Status: AC
  Administered 2024-05-22: 1000 mL via INTRAVENOUS

## 2024-05-22 MED ORDER — DIPHENHYDRAMINE HCL 50 MG/ML IJ SOLN: 12.5000 mg | Freq: Four times a day (QID) | INTRAMUSCULAR | Status: DC | PRN

## 2024-05-22 MED ORDER — HYDROMORPHONE HCL 2 MG/ML IJ SOLN
INTRAMUSCULAR | Status: AC
  Filled 2024-05-22: qty 1

## 2024-05-22 MED ORDER — FENTANYL CITRATE (PF) 100 MCG/2ML IJ SOLN
INTRAMUSCULAR | Status: DC | PRN
  Administered 2024-05-22 (×2): 50 ug via INTRAVENOUS

## 2024-05-22 MED ORDER — SUGAMMADEX SODIUM 200 MG/2ML IV SOLN
INTRAVENOUS | Status: DC | PRN
Start: 2024-05-22 — End: 2024-05-22
  Administered 2024-05-22: 200 mg via INTRAVENOUS

## 2024-05-22 MED ORDER — ONDANSETRON HCL 4 MG/2ML IJ SOLN
INTRAMUSCULAR | Status: DC | PRN
  Administered 2024-05-22: 4 mg via INTRAVENOUS

## 2024-05-22 MED ORDER — DOCUSATE SODIUM 100 MG PO CAPS
100.0000 mg | ORAL_CAPSULE | Freq: Two times a day (BID) | ORAL | Status: DC
  Administered 2024-05-22 – 2024-05-23 (×2): 100 mg via ORAL
  Filled 2024-05-22 (×2): qty 1

## 2024-05-22 MED ORDER — FENTANYL CITRATE (PF) 100 MCG/2ML IJ SOLN
INTRAMUSCULAR | Status: AC
  Filled 2024-05-22: qty 2

## 2024-05-22 MED ORDER — DIPHENHYDRAMINE HCL 12.5 MG/5ML PO ELIX: 12.5000 mg | ORAL_SOLUTION | Freq: Four times a day (QID) | ORAL | Status: DC | PRN

## 2024-05-22 MED ORDER — BUPIVACAINE LIPOSOME 1.3 % IJ SUSP
INTRAMUSCULAR | Status: DC | PRN
Start: 2024-05-22 — End: 2024-05-22
  Administered 2024-05-22: 20 mL

## 2024-05-22 MED ORDER — HYDROMORPHONE HCL 1 MG/ML IJ SOLN
INTRAMUSCULAR | Status: DC | PRN
  Administered 2024-05-22: .2 mg via INTRAVENOUS
  Administered 2024-05-22: .4 mg via INTRAVENOUS

## 2024-05-22 MED ORDER — ALBUTEROL SULFATE (2.5 MG/3ML) 0.083% IN NEBU: 3.0000 mL | INHALATION_SOLUTION | Freq: Four times a day (QID) | RESPIRATORY_TRACT | Status: DC | PRN

## 2024-05-22 MED ORDER — SODIUM CHLORIDE (PF) 0.9 % IJ SOLN
INTRAMUSCULAR | Status: DC | PRN
  Administered 2024-05-22: 20 mL

## 2024-05-22 MED ORDER — LACTATED RINGERS IV SOLN: INTRAVENOUS | Status: DC

## 2024-05-22 MED ORDER — NITROGLYCERIN 0.4 MG SL SUBL: 0.4000 mg | SUBLINGUAL_TABLET | SUBLINGUAL | Status: DC | PRN

## 2024-05-22 MED ORDER — ALBUMIN HUMAN 5 % IV SOLN
INTRAVENOUS | Status: DC | PRN
Start: 2024-05-22 — End: 2024-05-22

## 2024-05-22 MED ORDER — CEFAZOLIN SODIUM-DEXTROSE 2-4 GM/100ML-% IV SOLN
2.0000 g | INTRAVENOUS | Status: AC
  Administered 2024-05-22: 2 g via INTRAVENOUS
  Filled 2024-05-22: qty 100

## 2024-05-22 MED ORDER — HYDROMORPHONE HCL 1 MG/ML IJ SOLN: 0.2500 mg | INTRAMUSCULAR | Status: DC | PRN

## 2024-05-22 MED ORDER — PHENYLEPHRINE 80 MCG/ML (10ML) SYRINGE FOR IV PUSH (FOR BLOOD PRESSURE SUPPORT)
PREFILLED_SYRINGE | INTRAVENOUS | Status: AC
  Filled 2024-05-22: qty 10

## 2024-05-22 MED ORDER — PROPOFOL 10 MG/ML IV BOLUS
INTRAVENOUS | Status: AC
  Filled 2024-05-22: qty 20

## 2024-05-22 MED ORDER — ACETAMINOPHEN 500 MG PO TABS
1000.0000 mg | ORAL_TABLET | Freq: Four times a day (QID) | ORAL | Status: AC
  Administered 2024-05-22 – 2024-05-23 (×4): 1000 mg via ORAL
  Filled 2024-05-22 (×4): qty 2

## 2024-05-22 MED ORDER — DEXAMETHASONE SODIUM PHOSPHATE 10 MG/ML IJ SOLN
INTRAMUSCULAR | Status: AC
  Filled 2024-05-22: qty 1

## 2024-05-22 MED ORDER — OXYCODONE HCL 5 MG/5ML PO SOLN: 5.0000 mg | Freq: Once | ORAL | Status: DC | PRN

## 2024-05-22 MED ORDER — ONDANSETRON HCL 4 MG/2ML IJ SOLN: 4.0000 mg | Freq: Once | INTRAMUSCULAR | Status: DC | PRN

## 2024-05-22 MED ORDER — HYOSCYAMINE SULFATE 0.125 MG SL SUBL: 0.1250 mg | SUBLINGUAL_TABLET | SUBLINGUAL | Status: DC | PRN

## 2024-05-22 MED ORDER — BUPIVACAINE LIPOSOME 1.3 % IJ SUSP
INTRAMUSCULAR | Status: AC
  Filled 2024-05-22: qty 20

## 2024-05-22 MED ORDER — OXYCODONE HCL 5 MG PO TABS: 5.0000 mg | ORAL_TABLET | ORAL | Status: DC | PRN

## 2024-05-22 MED ORDER — PANTOPRAZOLE SODIUM 40 MG PO TBEC
40.0000 mg | DELAYED_RELEASE_TABLET | Freq: Every day | ORAL | Status: DC
  Administered 2024-05-23: 40 mg via ORAL
  Filled 2024-05-22: qty 1

## 2024-05-22 MED ORDER — LIDOCAINE HCL (PF) 2 % IJ SOLN
INTRAMUSCULAR | Status: AC
  Filled 2024-05-22: qty 5

## 2024-05-22 MED ORDER — LIDOCAINE HCL (CARDIAC) PF 100 MG/5ML IV SOSY
PREFILLED_SYRINGE | INTRAVENOUS | Status: DC | PRN
  Administered 2024-05-22: 60 mg via INTRAVENOUS

## 2024-05-22 MED ORDER — TRIPLE ANTIBIOTIC 3.5-400-5000 EX OINT: 1.0000 | TOPICAL_OINTMENT | Freq: Three times a day (TID) | CUTANEOUS | Status: DC | PRN

## 2024-05-22 MED ORDER — FINASTERIDE 5 MG PO TABS
5.0000 mg | ORAL_TABLET | Freq: Every evening | ORAL | Status: DC
  Administered 2024-05-22: 5 mg via ORAL
  Filled 2024-05-22: qty 1

## 2024-05-22 MED ORDER — DEXAMETHASONE SODIUM PHOSPHATE 10 MG/ML IJ SOLN
INTRAMUSCULAR | Status: DC | PRN
  Administered 2024-05-22: 8 mg via INTRAVENOUS

## 2024-05-22 MED ORDER — ORAL CARE MOUTH RINSE: 15.0000 mL | Freq: Once | OROMUCOSAL | Status: AC

## 2024-05-22 MED ORDER — SODIUM CHLORIDE 0.9 % IV SOLN: INTRAVENOUS | Status: DC

## 2024-05-22 MED ORDER — ONDANSETRON HCL 4 MG/2ML IJ SOLN
INTRAMUSCULAR | Status: AC
  Filled 2024-05-22: qty 2

## 2024-05-22 MED ORDER — HYDROCODONE-ACETAMINOPHEN 5-325 MG PO TABS: 1.0000 | ORAL_TABLET | Freq: Four times a day (QID) | ORAL | 0 refills | Status: DC | PRN

## 2024-05-22 MED ORDER — ROCURONIUM BROMIDE 10 MG/ML (PF) SYRINGE
PREFILLED_SYRINGE | INTRAVENOUS | Status: AC
  Filled 2024-05-22: qty 10

## 2024-05-22 MED ORDER — ROSUVASTATIN CALCIUM 20 MG PO TABS
40.0000 mg | ORAL_TABLET | Freq: Every day | ORAL | Status: DC
  Administered 2024-05-22 – 2024-05-23 (×2): 40 mg via ORAL
  Filled 2024-05-22 (×2): qty 2

## 2024-05-22 MED ORDER — MAGNESIUM CITRATE PO SOLN: 1.0000 | Freq: Once | ORAL | Status: DC

## 2024-05-22 MED ORDER — HYDROMORPHONE HCL 1 MG/ML IJ SOLN: 0.5000 mg | INTRAMUSCULAR | Status: DC | PRN

## 2024-05-22 MED ORDER — LACTATED RINGERS IV SOLN: INTRAVENOUS | Status: DC | PRN

## 2024-05-22 MED ORDER — CHLORHEXIDINE GLUCONATE 0.12 % MT SOLN
15.0000 mL | Freq: Once | OROMUCOSAL | Status: AC
  Administered 2024-05-22: 15 mL via OROMUCOSAL

## 2024-05-22 SURGICAL SUPPLY — 55 items
APPLICATOR COTTON TIP 6 STRL (MISCELLANEOUS) ×2 IMPLANT
APPLICATOR COTTON TIP 6IN STRL (MISCELLANEOUS) ×1 IMPLANT
BAG COUNTER SPONGE SURGICOUNT (BAG) IMPLANT
CATH FOLEY 2WAY SLVR 18FR 30CC (CATHETERS) ×2 IMPLANT
CATH TIEMANN FOLEY 18FR 5CC (CATHETERS) IMPLANT
CATH URTH STD 24FR FL 3W 2 (CATHETERS) ×2 IMPLANT
CHLORAPREP W/TINT 26 (MISCELLANEOUS) ×2 IMPLANT
CLIP LIGATING HEM O LOK PURPLE (MISCELLANEOUS) IMPLANT
COVER SURGICAL LIGHT HANDLE (MISCELLANEOUS) ×2 IMPLANT
COVER TIP SHEARS 8 DVNC (MISCELLANEOUS) ×2 IMPLANT
CUTTER ECHEON FLEX ENDO 45 340 (ENDOMECHANICALS) IMPLANT
DERMABOND ADVANCED .7 DNX12 (GAUZE/BANDAGES/DRESSINGS) ×2 IMPLANT
DRAPE ARM DVNC X/XI (DISPOSABLE) ×8 IMPLANT
DRAPE COLUMN DVNC XI (DISPOSABLE) ×2 IMPLANT
DRAPE SURG IRRIG POUCH 19X23 (DRAPES) ×2 IMPLANT
DRIVER NDL LRG 8 DVNC XI (INSTRUMENTS) ×4 IMPLANT
DRIVER NDLE LRG 8 DVNC XI (INSTRUMENTS) ×2 IMPLANT
DRSG TEGADERM 4X4.75 (GAUZE/BANDAGES/DRESSINGS) ×2 IMPLANT
ELECT PENCIL ROCKER SW 15FT (MISCELLANEOUS) ×2 IMPLANT
ELECT REM PT RETURN 15FT ADLT (MISCELLANEOUS) ×2 IMPLANT
FORCEPS BPLR LNG DVNC XI (INSTRUMENTS) ×2 IMPLANT
FORCEPS PROGRASP DVNC XI (FORCEP) ×2 IMPLANT
GAUZE SPONGE 4X4 12PLY STRL (GAUZE/BANDAGES/DRESSINGS) ×2 IMPLANT
GLOVE BIO SURGEON STRL SZ 6.5 (GLOVE) ×2 IMPLANT
GLOVE BIOGEL PI IND STRL 7.5 (GLOVE) IMPLANT
GLOVE SURG LX STRL 7.5 STRW (GLOVE) ×4 IMPLANT
GOWN STRL REUS W/ TWL XL LVL3 (GOWN DISPOSABLE) ×4 IMPLANT
GOWN STRL SURGICAL XL XLNG (GOWN DISPOSABLE) ×2 IMPLANT
HOLDER FOLEY CATH W/STRAP (MISCELLANEOUS) ×2 IMPLANT
IRRIGATION SUCT STRKRFLW 2 WTP (MISCELLANEOUS) ×2 IMPLANT
IV LACTATED RINGERS 1000ML (IV SOLUTION) ×2 IMPLANT
KIT TURNOVER KIT A (KITS) IMPLANT
NDL INSUFFLATION 14GA 120MM (NEEDLE) ×2 IMPLANT
NEEDLE INSUFFLATION 14GA 120MM (NEEDLE) ×1 IMPLANT
PACK ROBOT UROLOGY CUSTOM (CUSTOM PROCEDURE TRAY) ×2 IMPLANT
PAD POSITIONING PINK XL (MISCELLANEOUS) ×2 IMPLANT
PORT ACCESS TROCAR AIRSEAL 12 (TROCAR) ×2 IMPLANT
RELOAD STAPLE 45 4.1 GRN THCK (STAPLE) ×2 IMPLANT
SCISSORS MNPLR CVD DVNC XI (INSTRUMENTS) ×2 IMPLANT
SEAL UNIV 5-12 XI (MISCELLANEOUS) ×8 IMPLANT
SET TRI-LUMEN FLTR TB AIRSEAL (TUBING) ×2 IMPLANT
SOLUTION ELECTROSURG ANTI STCK (MISCELLANEOUS) ×2 IMPLANT
SPIKE FLUID TRANSFER (MISCELLANEOUS) ×2 IMPLANT
SPONGE T-LAP 4X18 ~~LOC~~+RFID (SPONGE) IMPLANT
STAPLE RELOAD 45 GRN (STAPLE) ×1 IMPLANT
SUT ETHILON 3 0 PS 1 (SUTURE) ×2 IMPLANT
SUT MNCRL AB 4-0 PS2 18 (SUTURE) ×4 IMPLANT
SUT PDS AB 1 CT1 27 (SUTURE) ×4 IMPLANT
SUT VIC AB 0 CT1 27XBRD ANTBC (SUTURE) ×6 IMPLANT
SUT VIC AB 2-0 SH 27X BRD (SUTURE) ×2 IMPLANT
SUT VICRYL 0 UR6 27IN ABS (SUTURE) ×2 IMPLANT
SUT VLOC 180 2-0 9IN GS21 (SUTURE) ×4 IMPLANT
SUTURE VLOC BRB 180 ABS3/0GR12 (SUTURE) ×4 IMPLANT
SYSTEM BAG RETRIEVAL 10MM (BASKET) ×2 IMPLANT
WATER STERILE IRR 1000ML POUR (IV SOLUTION) ×2 IMPLANT

## 2024-05-22 NOTE — Anesthesia Postprocedure Evaluation (Signed)
 Anesthesia Post Note  Patient: Kyle Matthews  Procedure(s) Performed: PROSTATECTOMY, SIMPLE, ROBOT-ASSISTED (Abdomen)     Patient location during evaluation: PACU Anesthesia Type: General Level of consciousness: awake and alert and oriented Pain management: pain level controlled Vital Signs Assessment: post-procedure vital signs reviewed and stable Respiratory status: spontaneous breathing, nonlabored ventilation and respiratory function stable Cardiovascular status: blood pressure returned to baseline and stable Postop Assessment: no apparent nausea or vomiting Anesthetic complications: no   No notable events documented.  Last Vitals:  Vitals:   05/22/24 1530 05/22/24 1545  BP: (!) 144/74 (!) 148/79  Pulse: 78 78  Resp: 12 16  Temp:    SpO2: 94% 94%    Last Pain:  Vitals:   05/22/24 1545  TempSrc:   PainSc: 0-No pain                 Berenise Hunton A.

## 2024-05-22 NOTE — Discharge Instructions (Addendum)
 1- Drain Sites - You may have some mild persistent drainage from old drain site for several days, this is normal. This can be covered with cotton gauze for convenience.  2 - Stiches - Your stitches are all dissolvable. You may notice a "loose thread" at your incisions, these are normal and require no intervention. You may cut them flush to the skin with fingernail clippers if needed for comfort.  3 - Diet - No restrictions  4 - Activity - No heavy lifting / straining (any activities that require valsalva or "bearing down") x 4 weeks. Otherwise, no restrictions.  5 - Bathing - You may shower immediately. Do not take a bath or get into swimming pool where incision sites are submersed in water x 4 weeks.   6 - Catheter - Will remain in place until removed at your next appointment. It may be cleaned with soap and water in the shower. It may be disconnected from the drain bag while in the shower to avoid tripping over the tube. You may apply Neosporin or Vaseline ointment as needed to the tip of the penis where the catheter inserts to reduce friction and irritation in this spot.   7 - When to Call the Doctor - Call MD for any fever >102, any acute wound problems, or any severe nausea / vomiting. You can call the [facility] [phone]) 24 hours a day 365 days a year. It will roll-over to the answering service and on-call physician after hours.   Complete the Cipro prescription you were given pre-operatively.  You may resume aspirin , advil, aleve, vitamins, and supplements 7 days after surgery.

## 2024-05-22 NOTE — H&P (Signed)
 Kyle Matthews is an 78 y.o. male.    Chief Complaint: Pre-OP Simple Prostatectomy  HPI:   1 - Urinary Retention / Massive Prostate - 120gm prostate with large median by MRI and CT 2024. Negative prostate biopsy 2009 and low risk MRI. PVR early 2024 pior to frank retention. Prostatic Artery embolization + firmagon  given 11/2023 during admission for CABG and TAVR as was having large hematuria. Also PO finasteride . Continued to fail voiding trials.   2 - Acute Renal Failure / Obstructive Uropathy - Cr 8's wit nornal K 07/2023 at time for foley placement in [place]. Cr was <1 2023. Cr back to <1.5 after foley.   PMH sig for CVA (no defits), murmer (follws Alois Arnt MD cards, not limiting), CAD/CABG/AVR. Retired from [employer]. His PCP is Olin Bertin MD   Today " Kyle Matthews " is seen to proceed with simple prostatectomy severe BPH that has failed all reasonable more consdervative measures. Most recetn UCX with pseudomonas cololization and has been on CX-specific cipro to reduce. Hgb 13, Cr 0.87, has held blood thinners as directed. Cards clearance on file.     Past Medical History:  Diagnosis Date  . Anemia   . Anxiety   . Arthritis   . BPH (benign prostatic hyperplasia)    Status post biopsy in 2009. - w/ LUTS  . Coronary artery disease   . Depression   . Essential hypertension   . Hyperlipidemia    On Crestor  - followed by PCP  . Moderate aortic regurgitation 04/2014   Moderate AS and AR - on echocardiogram  . 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 Stroke [facility]) 2007   No true etiology noted  . UTI (urinary tract infection)     Past Surgical History:  Procedure Laterality Date  . AORTIC VALVE REPLACEMENT N/A 11/02/2023   Procedure: AORTIC VALVE REPLACEMENT (AVR) USING 23 MM INSPIRIS RESILIA AORTIC VALVE;  Surgeon: Bartley Lightning, MD;  [facility];  Service: Open Heart Surgery;  Laterality: N/A;  . CORONARY ARTERY BYPASS GRAFT N/A 11/02/2023    Procedure: CORONARY ARTERY BYPASS GRAFTING (CABG) X TWO USING LEFT INTERNAL MAMMARY ARTERY AND RIGHT GREATER SAPHENEOUS VEIN HARVESTED ENDOSCOPICLY;  Surgeon: Bartley Lightning, MD;  [facility];  Service: Open Heart Surgery;  Laterality: N/A;  . CORONARY PRESSURE/FFR STUDY N/A 10/09/2023   Procedure: CORONARY PRESSURE/FFR STUDY;  Surgeon: Kyra Phy, MD;  [facility];  Service: Cardiovascular;  Laterality: N/A;  . IR ANGIOGRAM PELVIS SELECTIVE OR SUPRASELECTIVE  10/27/2023  . IR ANGIOGRAM PELVIS SELECTIVE OR SUPRASELECTIVE  10/27/2023  . IR ANGIOGRAM SELECTIVE EACH ADDITIONAL VESSEL  10/27/2023  . IR ANGIOGRAM SELECTIVE EACH ADDITIONAL VESSEL  10/27/2023  . IR ANGIOGRAM SELECTIVE EACH ADDITIONAL VESSEL  10/27/2023  . IR ANGIOGRAM SELECTIVE EACH ADDITIONAL VESSEL  10/27/2023  . IR EMBO ARTERIAL NOT HEMORR HEMANG INC GUIDE ROADMAPPING  10/27/2023  . IR US  GUIDE VASC ACCESS RIGHT  10/27/2023  . RIGHT/LEFT HEART CATH AND CORONARY ANGIOGRAPHY N/A 10/09/2023   Procedure: RIGHT/LEFT HEART CATH AND CORONARY ANGIOGRAPHY;  Surgeon: Kyra Phy, MD;  [facility];  Service: Cardiovascular;  Laterality: N/A;  . TEE WITHOUT CARDIOVERSION N/A 11/02/2023   Procedure: TRANSESOPHAGEAL ECHOCARDIOGRAM;  Surgeon: Bartley Lightning, MD;  Location[facility];  Service: Open Heart Surgery;  Laterality: N/A;  . TRANSTHORACIC ECHOCARDIOGRAM  04/28/2017    EF 65-70%. GR 1 DD. No regional wall motion abnormality. Moderate aortic stenosis with  moderate regurgitation. Mean gradient 34 mmHg.    Family History  Problem Relation Age of Onset  . Coronary artery disease Mother 80       38. Was diagnosed with a blood clot - suspect that this was a STEMI  . Lung cancer Father   . Thyroid  disease Sister   . Healthy Brother 38  . Pancreatic cancer Sister 59  . Cancer Maternal Grandmother   . Heart disease Maternal Grandfather 48  . Cancer Paternal Grandmother   . Stroke Paternal Grandfather     Social History:  reports that he has been smoking cigarettes. He has a 36 pack-year smoking history. He has never used smokeless tobacco. He reports that he does not currently use alcohol after a past usage of about 6.0 standard drinks of alcohol per week. He reports that he does not use drugs.  Allergies:  Allergies  Allergen Reactions  . Tetanus Toxoids     Childhood Allergy   . Zetia  [Ezetimibe ] Diarrhea    No medications prior to admission.    No results found for this or any previous visit (from the past 48 hours). No results found.  Review of Systems  Constitutional:  Negative for chills and fever.  Genitourinary:  Positive for difficulty urinating.  All other systems reviewed and are negative.  There were no vitals taken for this visit. Physical Exam Vitals reviewed.  HENT:     Head: Normocephalic.  Eyes:     Pupils: Pupils are equal, round, and reactive to light.  Cardiovascular:     Rate and Rhythm: Normal rate.     Comments: Well healed sternotomy Pulmonary:     Effort: Pulmonary effort is normal.  Abdominal:     General: Abdomen is flat.  Genitourinary:    Comments: No CVAT. Catheter in place with non-foul urine.  Musculoskeletal:        General: Normal range of motion.     Cervical back: Normal range of motion.  Skin:    General: Skin is warm.  Neurological:     General: No focal deficit present.     Mental Status: He is alert.  Psychiatric:        Mood and Affect: Mood normal.     Assessment/Plan  Proceed as planned with robotic simple prostatectomy for refractory BPH. Risks, benefits, alternatives, expected peri-op course discussed previously and reiterated today. Although he has substantial CV comorbidity it has been optimized as much as possible.   Melody Spurling., MD 05/22/2024, 6:33 AM

## 2024-05-22 NOTE — Brief Op Note (Signed)
 05/22/2024  2:34 PM  PATIENT:  Jenelle Mis  78 y.o. male  PRE-OPERATIVE DIAGNOSIS:  LARGE PROSTATE WITH RETENTION  POST-OPERATIVE DIAGNOSIS:  LARGE PROSTATE WITH RETENTION  PROCEDURE:  Procedure(s): PROSTATECTOMY, SIMPLE, ROBOT-ASSISTED (N/A)  SURGEON:  Surgeons and Role:    * Manny, Harvey Linen., MD - Primary  PHYSICIAN ASSISTANT:   ASSISTANTS: Carrolyn Clan PA   ANESTHESIA:   local and general  EBL:    BLOOD ADMINISTERED:none  DRAINS: 1- JP to bulb; 2 - Foley to gravity (irrigation port plugged)   LOCAL MEDICATIONS USED:  MARCAINE     SPECIMEN:  Source of Specimen:  prostate adenoma  DISPOSITION OF SPECIMEN:  PATHOLOGY  COUNTS:  YES  TOURNIQUET:  * No tourniquets in log *  DICTATION: .Other Dictation: Dictation Number [id number]  PLAN OF CARE: Admit for overnight observation  PATIENT DISPOSITION:  PACU - hemodynamically stable.   Delay start of Pharmacological VTE agent (>24hrs) due to surgical blood loss or risk of bleeding: yes

## 2024-05-22 NOTE — Anesthesia Procedure Notes (Signed)
 Procedure Name: Intubation Date/Time: 05/22/2024 12:26 PM  Performed by: [place], Avi Lemma, CRNAPre-anesthesia Checklist: Patient identified, Emergency Drugs available, Suction available and Patient being monitored Patient Re-evaluated:Patient Re-evaluated prior to induction Oxygen Delivery Method: Circle system utilized Preoxygenation: Pre-oxygenation with 100% oxygen Induction Type: IV induction Ventilation: Mask ventilation without difficulty Laryngoscope Size: Mac and 3 Grade View: Grade I Tube type: Oral Tube size: 7.5 mm Number of attempts: 1 Airway Equipment and Method: Stylet and Oral airway Placement Confirmation: ETT inserted through vocal cords under direct vision, positive ETCO2 and breath sounds checked- equal and bilateral Secured at: 21 cm Tube secured with: Tape Dental Injury: Teeth and Oropharynx as per pre-operative assessment

## 2024-05-22 NOTE — Transfer of Care (Signed)
 Immediate Anesthesia Transfer of Care Note  Patient: Kyle Matthews  Procedure(s) Performed: PROSTATECTOMY, SIMPLE, ROBOT-ASSISTED (Abdomen)  Patient Location: PACU  Anesthesia Type:General  Level of Consciousness: sedated and responds to stimulation  Airway & Oxygen Therapy: Patient Spontanous Breathing and Patient connected to nasal cannula oxygen  Post-op Assessment: Report given to RN and Post -op Vital signs reviewed and stable  Post vital signs: Reviewed and stable  Last Vitals:  Vitals Value Taken Time  BP 153/65 05/22/24 1453  Temp    Pulse 76 05/22/24 1456  Resp    SpO2 100 % 05/22/24 1456  Vitals shown include unfiled device data.  Last Pain:  Vitals:   05/22/24 1107  TempSrc:   PainSc: 0-No pain         Complications: No notable events documented.

## 2024-05-23 ENCOUNTER — Encounter (HOSPITAL_COMMUNITY): Payer: Self-pay | Admitting: Urology

## 2024-05-23 DIAGNOSIS — Z951 Presence of aortocoronary bypass graft: Secondary | ICD-10-CM | POA: Diagnosis not present

## 2024-05-23 DIAGNOSIS — N179 Acute kidney failure, unspecified: Secondary | ICD-10-CM | POA: Diagnosis not present

## 2024-05-23 DIAGNOSIS — N139 Obstructive and reflux uropathy, unspecified: Secondary | ICD-10-CM | POA: Diagnosis not present

## 2024-05-23 DIAGNOSIS — Z8673 Personal history of transient ischemic attack (TIA), and cerebral infarction without residual deficits: Secondary | ICD-10-CM | POA: Diagnosis not present

## 2024-05-23 DIAGNOSIS — F17210 Nicotine dependence, cigarettes, uncomplicated: Secondary | ICD-10-CM | POA: Diagnosis not present

## 2024-05-23 DIAGNOSIS — I2510 Atherosclerotic heart disease of native coronary artery without angina pectoris: Secondary | ICD-10-CM | POA: Diagnosis not present

## 2024-05-23 DIAGNOSIS — I5043 Acute on chronic combined systolic (congestive) and diastolic (congestive) heart failure: Secondary | ICD-10-CM | POA: Diagnosis not present

## 2024-05-23 DIAGNOSIS — R339 Retention of urine, unspecified: Secondary | ICD-10-CM | POA: Diagnosis not present

## 2024-05-23 DIAGNOSIS — N401 Enlarged prostate with lower urinary tract symptoms: Secondary | ICD-10-CM | POA: Diagnosis not present

## 2024-05-23 DIAGNOSIS — I110 Hypertensive heart disease with heart failure: Secondary | ICD-10-CM | POA: Diagnosis not present

## 2024-05-23 LAB — BASIC METABOLIC PANEL WITH GFR
Anion gap: 10 (ref 5–15)
BUN: 9 mg/dL (ref 8–23)
CO2: 20 mmol/L — ABNORMAL LOW (ref 22–32)
Calcium: 8.3 mg/dL — ABNORMAL LOW (ref 8.9–10.3)
Chloride: 104 mmol/L (ref 98–111)
Creatinine, Ser: 0.53 mg/dL — ABNORMAL LOW (ref 0.61–1.24)
GFR, Estimated: >60 mL/min (ref >60)
Glucose, Bld: 113 mg/dL — ABNORMAL HIGH (ref 70–99)
Potassium: 3.9 mmol/L (ref 3.5–5.1)
Sodium: 134 mmol/L — ABNORMAL LOW (ref 135–145)

## 2024-05-23 LAB — HEMOGLOBIN AND HEMATOCRIT, BLOOD
HCT: 37 % — ABNORMAL LOW (ref 39.0–52.0)
Hemoglobin: 11.5 g/dL — ABNORMAL LOW (ref 13.0–17.0)

## 2024-05-23 MED ORDER — OXYCODONE HCL 5 MG PO TABS: 5.0000 mg | ORAL_TABLET | ORAL | Status: DC | PRN

## 2024-05-23 NOTE — Care Management Obs Status (Signed)
 MEDICARE OBSERVATION STATUS NOTIFICATION   Patient Details  Name: Kyle Matthews MRN: [id number] Date of Birth: 03/24/1946   Medicare Observation Status Notification Given:  Yes    MahabirThersia Flax, RN 05/23/2024, 3:03 PM

## 2024-05-23 NOTE — TOC Transition Note (Signed)
 Transition of Care [facility]) - Discharge Note   Patient Details  Name: Kyle Matthews MRN: [id number] Date of Birth: Sep 11, 1946  Transition of Care [facility]) CM/SW Contact:  Ruben Corolla, RN Phone Number: 05/23/2024, 10:48 AM   Clinical Narrative:  d/c plan home.     Final next level of care: Home/Self Care Barriers to Discharge: No Barriers Identified   Patient Goals and CMS Choice Patient states their goals for this hospitalization and ongoing recovery are:: Home CMS Medicare.gov Compare Post Acute Care list provided to:: Patient Choice offered to / list presented to : Patient [place] ownership interest in [facility].provided to:: Patient    Discharge Placement                       Discharge Plan and Services Additional resources added to the After Visit Summary for                                       Social Drivers of Health (SDOH) Interventions SDOH Screenings   Food Insecurity: Food Insecurity Present (05/22/2024)  Housing: Unknown (05/23/2024)  Transportation Needs: No Transportation Needs (05/22/2024)  Utilities: Not At Risk (05/22/2024)  Depression (PHQ2-9): High Risk (02/12/2024)  Social Connections: Patient Declined (05/23/2024)  Tobacco Use: High Risk (05/22/2024)     Readmission Risk Interventions    11/08/2023   10:21 AM 10/25/2023    3:42 PM  Readmission Risk Prevention Plan  Transportation Screening Complete Complete  PCP or Specialist Appt within 3-5 Days  Complete  HRI or Home Care Consult  Complete  Palliative Care Screening  Not Applicable  Medication Review (RN Care Manager)  Complete

## 2024-05-23 NOTE — Plan of Care (Signed)
  Problem: Education: Goal: Knowledge of the procedure and recovery process will improve Outcome: Progressing   Problem: Bowel/Gastric: Goal: Gastrointestinal status for postoperative course will improve Outcome: Progressing   Problem: Pain Management: Goal: General experience of comfort will improve Outcome: Progressing   Problem: Education: Goal: Knowledge of General Education information will improve Description: Including pain rating scale, medication(s)/side effects and non-pharmacologic comfort measures Outcome: Progressing   Problem: Clinical Measurements: Goal: Diagnostic test results will improve Outcome: Progressing   Problem: Nutrition: Goal: Adequate nutrition will be maintained Outcome: Progressing   Problem: Pain Managment: Goal: General experience of comfort will improve and/or be controlled Outcome: Progressing   Problem: Safety: Goal: Ability to remain free from injury will improve Outcome: Progressing

## 2024-05-23 NOTE — Op Note (Signed)
 [name], [name] MEDICAL RECORD NO: [id number] ACCOUNT NO: [id number] DATE OF BIRTH: [birth year] FACILITY: [facility] LOCATION: [id number] PHYSICIAN: Kyle Blaze, MD  Operative Report   DATE OF PROCEDURE: 05/22/2024  PREOPERATIVE DIAGNOSIS: Prostatic hypertrophy with refractory urine retention.  PROCEDURE PERFORMED: Robotic-assisted laparoscopic simple prostatectomy.  ESTIMATED BLOOD LOSS:  100 mL  COMPLICATIONS:  None.  SPECIMEN:  Prostate adenoma for permanent pathology.  FINDINGS: 1.  Predominantly median lobe prostatic hypertrophy. 2.  Wide open prostate fossa from the bladder neck to the membranous urethra following simple prostatectomy.  ASSISTANT:  Carrolyn Clan, PA  DRAINS: 1.  Jackson-Pratt drain to bulb suction. 2.  Foley catheter to straight drain.   INDICATIONS: The patient is a 78 year old man with a nearly year-long protracted course of urinary retention. He developed retention last year with acute renal failure, which was acutely managed with a catheter placement. He was placed on maximum medical  therapy, but continued to have retention and also refractory bleeding. He underwent at that time a semi-urgent prostatic artery embolization as well as even androgen deprivation therapy, which did help significantly with his bleeding; however, he  continued to fail voiding trials. During this time, he also was noted to have significant ischemic cardiac disease as well as valvular disease. He underwent open heart surgery when these issues were addressed. He has now healed well from that. He is  being cleared to come off dual antiplatelet therapy and now desires definitive management of his prostate with simple prostatectomy. Informed consent was obtained and placed in the medical record. He has been on Cipro preoperatively according to culture  to reduce colonization.  DESCRIPTION OF PROCEDURE: The patient being himself verified, procedure being robotic-assisted simple prostatectomy  confirmed. Procedure timeout was performed. Intravenous antibiotics administered.  General endotracheal anesthesia induced.  The patient  was placed in low lithotomy position.  A sterile field was created, prepping and draping the base of penis, perineum and proximal thigh using iodine and his infra-xiphoid abdomen using chlorhexidine  gluconate.  He was further fashioned to the operative  table using 3-inch tape over foam padding across the supraxiphoid chest.  His arms were tucked to the side with gel rolls.  A test of steep Trendelenburg positioning was performed. He was found to be suitably positioned.  His in situ Foley catheter had  already been removed and a new one was placed. High flow, low pressure pneumoperitoneum was obtained using Veress technique in the supraumbilical midline having passed the aspiration and drop test.  An 8 mm robotic camera port was then placed in the same  location.  Laparoscopic examination of the peritoneal cavity revealed  no significant adhesions, no visceral injury.  Additional ports were placed as follows:  Right paramedian 8 mm robotic port, right far lateral 12 mm AirSeal assistant port, right  paramedian 5 mm suction port, left paramedian 8 mm robotic port, left far lateral 8 mm robotic port.  Robot was docked and passed the electronic checks.  Initial attention was directed to development of the space of Retzius.  An incision was made lateral  to the right medial umbilical ligament towards the area of the internal ring coursing along the iliac vessels towards the area of the right ureter.  The right vas deferens was encountered, ligated using medial bucket handle and the right bladder was  swept away from the pelvic sidewall towards the area of the endopelvic fascia on the right side.  A mirror image dissection was performed on the left side. Anterior attachments  were taken down with cautery scissors to expose the anterior base of the  prostate, which was defatted  to denote the true bladder neck-prostate junction. Next, the endopelvic fascia was carefully swept away from the apical aspect of the prostate just enough to expose the dorsal venous complex, which was controlled using green  load stapler. The area of the bladder neck was then identified again and an inverted U cystotomy was made for approximately 50% of the anterior circumference of the bladder in a position approximately 2 cm proximal to the true bladder neck. This provided  excellent exposure to the trilobar prostate adenoma. Again, this was predominantly median lobe enlargement. Next, Figure of Eight Vicryl stay sutures were applied x2 in the median lobe and then one in the right lobe and one in the left respectively as stay sutures.  The trigonal bridge was identified as were the ureters. An incision was made in the posterior mucosa approximately 2 cm distal to the trigonal bridge entering the adenoma plane posteriorly behind the median lobe. This was then carried inferiorly to the  apical aspect of the prostate, which was noted by anterior curvature and then swept laterally to the 3 o'clock and 9 o'clock positions respectively.  The lateral adenomas were then also dissected away from the capsule from the 3 o'clock to 12 o'clock and  then the 9 o'clock to 12 o'clock positions respectively such that the adenoma was only attached by its membranous urethra apical attachments. This area was addressed by making a butterfly incision in the anterior commissure exposing better the area of  the apex of the prostate, which was then amputated from the membranous urethra. This completely freed up the large adenoma specimen, which was placed in an EndoCatch bag for retrieval. Additional hemostasis of the prostatic fossa was performed using  point coagulation current. Hemostasis was excellent. Digital rectal exam was performed with the indicator glove. No evidence of rectal violation was noted. Posterior  reconstruction was performed using a double-armed 2-0 V-Loc suture, essentially  performing a 50% bladder mucosa to urethral anastomosis, which covered up the posterior prostate fossa creating excellent mucosal bridge. The previous inverted U cystotomy was then closed using 2 separate running suture lines of 2-0 Stratafix meeting in  the midline. A new 3-way Foley catheter was placed. 30 mL sterile water in balloon, was irrigated quantitatively.  It was placed free to straight drain, with the irrigation port plugged. A closed suction drain was brought the previous left lateral most  port site at the area of peritoneal cavity. Robot was then undocked.  The previous 12 mm assistant port site was closed at the level of fascia using Carter-Thomason suture passer and 0 Vicryl.  The specimen was retrieved by extending the previous camera  port site superiorly and inferiorly for a total distance approximately 4 cm, removing the adenoma specimen, setting aside for permanent pathology. Extraction site was then closed at level of fascia using figure-of-eight PDS x3, followed by  reapproximation of Scarpa's with running Vicryl.  All incision sites were infiltrated with dilute lipolyzed Marcaine  and closed at the level of the skin using subcuticular Monocryl followed by Dermabond.  The procedure was then terminated.  The patient  tolerated the procedure well and no immediate periprocedural complications.  The patient was taken to Postanesthesia Care Unit in stable condition with plan for observation admission.  Please note first assistant, Carrolyn Clan, was crucial for all portions of the surgery today.  She provided invaluable retraction, suctioning, vascular  stapling, vascular clipping, specimen manipulation, robotic instrument exchange, and general first  assistance.       PAA D: 05/22/2024 2:43:04 pm T: 05/23/2024 2:16:00 am  JOB: [id number]/ [id number]

## 2024-05-23 NOTE — Discharge Summary (Signed)
 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   S/P AVR (aortic valve replacement) 11/02/2023   Chronic heart failure with preserved ejection fraction (HFpEF) (HCC) 11/01/2023   Acute blood loss anemia 10/30/2023   Benign hypertension 10/30/2023   Acute heart failure with preserved ejection fraction (HFpEF) (HCC) 10/28/2023   Atherosclerosis of native coronary artery of native heart without angina pectoris 10/28/2023   Pressure injury of skin 10/28/2023   Chest pain 10/23/2023   History of essential hypertension 10/23/2023   GERD (gastroesophageal reflux disease) 10/23/2023   NSTEMI (non-ST elevated myocardial infarction) (HCC) 10/11/2023   Aortic valve disease 10/11/2023   Coronary artery disease involving native coronary artery of native heart with unstable angina pectoris (HCC) 10/10/2023   Chronic blood loss anemia 10/10/2023   Gross hematuria 10/07/2023   BPH with urinary obstruction 10/04/2023   Acute on chronic diastolic CHF (congestive heart failure) (HCC) 10/04/2023   Symptomatic anemia 10/03/2023   Kidney lesion, native, left 10/03/2023   Acute respiratory failure with hypoxia (HCC) 10/03/2023   Lesion of right native kidney 10/03/2023   Elevated brain natriuretic peptide (BNP) level 10/03/2023   Coronary artery disease 10/03/2023   SOB (shortness of breath) 10/03/2023   Bladder outlet obstruction 10/03/2023   Chronic indwelling Foley catheter 10/03/2023   Hematuria 10/03/2023   Enlarged prostate 10/03/2023   Moderate aortic regurgitation 10/03/2023   Hyperlipidemia 05/20/2017   Nicotine dependence, cigarettes, uncomplicated 05/20/2017   Carotid bruit 05/19/2017   Essential hypertension    RBBB 04/13/2017   Severe aortic stenosis  04/13/2017    Procedures performed: Procedure(s): PROSTATECTOMY, SIMPLE, ROBOT-ASSISTED  History and Physical: For full details, please see admission history and physical. Briefly, Kyle Matthews is a 78 y.o. year old patient with a history of BPH (150g gland) and severe LUTS/AUR with catheter dependence. He underwent RAL simple prostatectomy on 05/22/24 with Dr. Secundino Dach.  Hospital Course: Patient tolerated the procedure well.  He was then transferred to the floor after an uneventful PACU stay. Postoperatively he did not undergo CBI, urine remained thin and clear red.  His hospital course was uncomplicated.  On POD#1 he had met discharge criteria: was eating a regular diet, was up and ambulating independently,  pain was well controlled,  and was ready to for discharge. His JP drain was removed prior to discharge.  He was prescribed 5 days of ciprofloxacin preoperatively for pseudomonas colonization, took 2 days worth. He will finish the remaining 3 days of antibiotic at home. He will follow up on 06/03/24 for catheter removal.    Laboratory values:  Recent Labs    05/22/24 1514 05/23/24 0500  HGB 12.3* 11.5*  HCT 36.9* 37.0*   Recent Labs    05/23/24 0500  NA 134*  K 3.9  CL 104  CO2 20*  GLUCOSE 113*  BUN 9  CREATININE 0.53*  CALCIUM  8.3*   No results for input(s): "LABPT", "INR" in the last 72 hours. No results for input(s): "LABURIN" in the last 72 hours. Results for orders placed or performed during the hospital encounter of 10/23/23  SARS Coronavirus 2 by RT PCR (hospital order, performed in [facility] hospital lab) *cepheid single result test* Anterior Nasal Swab     Status: None   Collection Time: 11/01/23  3:19 PM  Specimen: Anterior Nasal Swab  Result Value Ref Range Status   SARS Coronavirus 2 by RT PCR NEGATIVE NEGATIVE Final    Comment: Performed at [facility], [address] [place], [place] [address]  Surgical pcr screen     Status: None   Collection Time:  11/02/23 12:45 AM   Specimen: Urine, Catheterized; Nasal Swab  Result Value Ref Range Status   MRSA, PCR NEGATIVE NEGATIVE Final   Staphylococcus aureus NEGATIVE NEGATIVE Final    Comment: (NOTE) The Xpert SA Assay (FDA approved for NASAL specimens in patients 39 years of age and older), is one component of a comprehensive surveillance program. It is not intended to diagnose infection nor to guide or monitor treatment. Performed at [facility], [address], [place], [place] [address]     Physical Exam  Gen: NAD Resp: Satting well on RA Card: Regular rate Abd: Soft, appropriately tender, ND, incision clean dry and intact GU:  Foley catheter in place draining urine. Neuro: Alert   Disposition: Home  Discharge instruction: The patient was instructed to be ambulatory but told to refrain from heavy lifting, strenuous activity, or driving.   Discharge medications:  Allergies as of 05/23/2024       Reactions   Tetanus Toxoids    Childhood Allergy    Zetia  [ezetimibe ] Diarrhea        Medication List     STOP taking these medications    aspirin  EC 81 MG tablet       TAKE these medications    albuterol  108 (90 Base) MCG/ACT inhaler Commonly known as: VENTOLIN  HFA Inhale 2 puffs into the lungs every 6 (six) hours as needed for wheezing or shortness of breath.   amLODipine  5 MG tablet Commonly known as: NORVASC  Take 1 tablet (5 mg total) by mouth daily.   docusate sodium  100 MG capsule Commonly known as: COLACE Take 1 capsule (100 mg total) by mouth 2 (two) times daily.   finasteride  5 MG tablet Commonly known as: PROSCAR  Take 5 mg by mouth every evening.   furosemide  20 MG tablet Commonly known as: LASIX  TAKE 1 TABLET(20 MG) BY MOUTH DAILY AS NEEDED   HYDROcodone-acetaminophen  5-325 MG tablet Commonly known as: NORCO/VICODIN Take 1-2 tablets by mouth every 6 (six) hours as needed for moderate pain (pain score 4-6) or severe pain (pain score  7-10).   IRON  27 PO Take 27 mg by mouth daily.   nitroGLYCERIN  0.4 MG SL tablet Commonly known as: NITROSTAT  Place 1 tablet (0.4 mg total) under the tongue every 5 (five) minutes as needed for chest pain.   pantoprazole  40 MG tablet Commonly known as: PROTONIX  Take 1 tablet (40 mg total) by mouth daily.   rosuvastatin  40 MG tablet Commonly known as: CRESTOR  Take 40 mg by mouth daily.        Followup:   Follow-up Information     [facility] Follow up on 06/03/2024.   Why: for nurse practitioner visit and catheter removal Contact information: [address] [place] [address] [place]  [phone]

## 2024-05-24 LAB — SURGICAL PATHOLOGY

## 2024-05-30 DIAGNOSIS — Z8673 Personal history of transient ischemic attack (TIA), and cerebral infarction without residual deficits: Secondary | ICD-10-CM | POA: Diagnosis not present

## 2024-06-03 DIAGNOSIS — R338 Other retention of urine: Secondary | ICD-10-CM | POA: Diagnosis not present

## 2024-06-10 DIAGNOSIS — R338 Other retention of urine: Secondary | ICD-10-CM | POA: Diagnosis not present

## 2024-06-12 ENCOUNTER — Emergency Department (HOSPITAL_COMMUNITY)

## 2024-06-12 ENCOUNTER — Other Ambulatory Visit: Payer: Self-pay

## 2024-06-12 ENCOUNTER — Inpatient Hospital Stay (HOSPITAL_COMMUNITY)
Admission: EM | Admit: 2024-06-12 | Discharge: 2024-07-04 | DRG: 216 | Disposition: A | Discharge status: Home Health | Source: Other Acute Inpatient Hospital | Attending: Surgery | Admitting: Surgery

## 2024-06-12 ENCOUNTER — Encounter (HOSPITAL_COMMUNITY): Payer: Self-pay | Admitting: Internal Medicine

## 2024-06-12 DIAGNOSIS — E872 Acidosis, unspecified: Secondary | ICD-10-CM | POA: Diagnosis present

## 2024-06-12 DIAGNOSIS — E782 Mixed hyperlipidemia: Secondary | ICD-10-CM | POA: Diagnosis present

## 2024-06-12 DIAGNOSIS — R079 Chest pain, unspecified: Secondary | ICD-10-CM | POA: Diagnosis not present

## 2024-06-12 DIAGNOSIS — D689 Coagulation defect, unspecified: Secondary | ICD-10-CM | POA: Diagnosis present

## 2024-06-12 DIAGNOSIS — Z7982 Long term (current) use of aspirin: Secondary | ICD-10-CM

## 2024-06-12 DIAGNOSIS — I76 Septic arterial embolism: Secondary | ICD-10-CM | POA: Diagnosis not present

## 2024-06-12 DIAGNOSIS — I2489 Other forms of acute ischemic heart disease: Secondary | ICD-10-CM | POA: Diagnosis present

## 2024-06-12 DIAGNOSIS — N138 Other obstructive and reflux uropathy: Secondary | ICD-10-CM | POA: Diagnosis not present

## 2024-06-12 DIAGNOSIS — N990 Postprocedural (acute) (chronic) kidney failure: Secondary | ICD-10-CM | POA: Diagnosis not present

## 2024-06-12 DIAGNOSIS — T826XXA Infection and inflammatory reaction due to cardiac valve prosthesis, initial encounter: Principal | ICD-10-CM | POA: Diagnosis present

## 2024-06-12 DIAGNOSIS — Y712 Prosthetic and other implants, materials and accessory cardiovascular devices associated with adverse incidents: Secondary | ICD-10-CM | POA: Diagnosis present

## 2024-06-12 DIAGNOSIS — Z800 Family history of malignant neoplasm of digestive organs: Secondary | ICD-10-CM

## 2024-06-12 DIAGNOSIS — I452 Bifascicular block: Secondary | ICD-10-CM | POA: Diagnosis not present

## 2024-06-12 DIAGNOSIS — Z951 Presence of aortocoronary bypass graft: Secondary | ICD-10-CM | POA: Diagnosis not present

## 2024-06-12 DIAGNOSIS — R6889 Other general symptoms and signs: Secondary | ICD-10-CM | POA: Diagnosis present

## 2024-06-12 DIAGNOSIS — R7881 Bacteremia: Secondary | ICD-10-CM | POA: Diagnosis not present

## 2024-06-12 DIAGNOSIS — I509 Heart failure, unspecified: Secondary | ICD-10-CM

## 2024-06-12 DIAGNOSIS — J9811 Atelectasis: Secondary | ICD-10-CM | POA: Diagnosis not present

## 2024-06-12 DIAGNOSIS — I5032 Chronic diastolic (congestive) heart failure: Secondary | ICD-10-CM | POA: Diagnosis present

## 2024-06-12 DIAGNOSIS — N401 Enlarged prostate with lower urinary tract symptoms: Secondary | ICD-10-CM | POA: Diagnosis present

## 2024-06-12 DIAGNOSIS — R0789 Other chest pain: Secondary | ICD-10-CM | POA: Diagnosis not present

## 2024-06-12 DIAGNOSIS — I10 Essential (primary) hypertension: Secondary | ICD-10-CM | POA: Diagnosis present

## 2024-06-12 DIAGNOSIS — B9562 Methicillin resistant Staphylococcus aureus infection as the cause of diseases classified elsewhere: Secondary | ICD-10-CM | POA: Diagnosis present

## 2024-06-12 DIAGNOSIS — T501X5A Adverse effect of loop [high-ceiling] diuretics, initial encounter: Secondary | ICD-10-CM | POA: Diagnosis not present

## 2024-06-12 DIAGNOSIS — R29700 NIHSS score 0: Secondary | ICD-10-CM | POA: Diagnosis present

## 2024-06-12 DIAGNOSIS — Z8673 Personal history of transient ischemic attack (TIA), and cerebral infarction without residual deficits: Secondary | ICD-10-CM

## 2024-06-12 DIAGNOSIS — R6883 Chills (without fever): Secondary | ICD-10-CM | POA: Diagnosis not present

## 2024-06-12 DIAGNOSIS — K5641 Fecal impaction: Secondary | ICD-10-CM | POA: Diagnosis present

## 2024-06-12 DIAGNOSIS — I639 Cerebral infarction, unspecified: Secondary | ICD-10-CM | POA: Diagnosis not present

## 2024-06-12 DIAGNOSIS — B957 Other staphylococcus as the cause of diseases classified elsewhere: Secondary | ICD-10-CM | POA: Diagnosis present

## 2024-06-12 DIAGNOSIS — I350 Nonrheumatic aortic (valve) stenosis: Secondary | ICD-10-CM | POA: Diagnosis not present

## 2024-06-12 DIAGNOSIS — I330 Acute and subacute infective endocarditis: Secondary | ICD-10-CM | POA: Diagnosis not present

## 2024-06-12 DIAGNOSIS — I110 Hypertensive heart disease with heart failure: Secondary | ICD-10-CM | POA: Diagnosis not present

## 2024-06-12 DIAGNOSIS — Z8349 Family history of other endocrine, nutritional and metabolic diseases: Secondary | ICD-10-CM

## 2024-06-12 DIAGNOSIS — R000 Tachycardia, unspecified: Secondary | ICD-10-CM | POA: Diagnosis not present

## 2024-06-12 DIAGNOSIS — J95821 Acute postprocedural respiratory failure: Secondary | ICD-10-CM | POA: Diagnosis not present

## 2024-06-12 DIAGNOSIS — I442 Atrioventricular block, complete: Secondary | ICD-10-CM | POA: Diagnosis not present

## 2024-06-12 DIAGNOSIS — E875 Hyperkalemia: Secondary | ICD-10-CM | POA: Diagnosis not present

## 2024-06-12 DIAGNOSIS — E876 Hypokalemia: Secondary | ICD-10-CM | POA: Diagnosis not present

## 2024-06-12 DIAGNOSIS — Z79899 Other long term (current) drug therapy: Secondary | ICD-10-CM

## 2024-06-12 DIAGNOSIS — J439 Emphysema, unspecified: Secondary | ICD-10-CM | POA: Diagnosis not present

## 2024-06-12 DIAGNOSIS — I4891 Unspecified atrial fibrillation: Secondary | ICD-10-CM | POA: Diagnosis present

## 2024-06-12 DIAGNOSIS — F17210 Nicotine dependence, cigarettes, uncomplicated: Secondary | ICD-10-CM | POA: Diagnosis present

## 2024-06-12 DIAGNOSIS — J449 Chronic obstructive pulmonary disease, unspecified: Secondary | ICD-10-CM | POA: Diagnosis present

## 2024-06-12 DIAGNOSIS — I079 Rheumatic tricuspid valve disease, unspecified: Secondary | ICD-10-CM | POA: Insufficient documentation

## 2024-06-12 DIAGNOSIS — I2510 Atherosclerotic heart disease of native coronary artery without angina pectoris: Secondary | ICD-10-CM | POA: Diagnosis not present

## 2024-06-12 DIAGNOSIS — I700 Atherosclerosis of aorta: Secondary | ICD-10-CM | POA: Diagnosis not present

## 2024-06-12 DIAGNOSIS — I6340 Cerebral infarction due to embolism of unspecified cerebral artery: Secondary | ICD-10-CM | POA: Diagnosis not present

## 2024-06-12 DIAGNOSIS — R609 Edema, unspecified: Secondary | ICD-10-CM | POA: Diagnosis not present

## 2024-06-12 DIAGNOSIS — E871 Hypo-osmolality and hyponatremia: Secondary | ICD-10-CM | POA: Diagnosis present

## 2024-06-12 DIAGNOSIS — I071 Rheumatic tricuspid insufficiency: Secondary | ICD-10-CM | POA: Diagnosis present

## 2024-06-12 DIAGNOSIS — Z954 Presence of other heart-valve replacement: Secondary | ICD-10-CM

## 2024-06-12 DIAGNOSIS — D62 Acute posthemorrhagic anemia: Secondary | ICD-10-CM | POA: Diagnosis present

## 2024-06-12 DIAGNOSIS — Z823 Family history of stroke: Secondary | ICD-10-CM

## 2024-06-12 DIAGNOSIS — R231 Pallor: Secondary | ICD-10-CM | POA: Diagnosis not present

## 2024-06-12 DIAGNOSIS — R7989 Other specified abnormal findings of blood chemistry: Secondary | ICD-10-CM | POA: Diagnosis not present

## 2024-06-12 DIAGNOSIS — Z952 Presence of prosthetic heart valve: Secondary | ICD-10-CM | POA: Diagnosis not present

## 2024-06-12 DIAGNOSIS — G40909 Epilepsy, unspecified, not intractable, without status epilepticus: Secondary | ICD-10-CM | POA: Diagnosis present

## 2024-06-12 DIAGNOSIS — Z8249 Family history of ischemic heart disease and other diseases of the circulatory system: Secondary | ICD-10-CM

## 2024-06-12 DIAGNOSIS — Q2543 Congenital aneurysm of aorta: Secondary | ICD-10-CM

## 2024-06-12 DIAGNOSIS — R6510 Systemic inflammatory response syndrome (SIRS) of non-infectious origin without acute organ dysfunction: Secondary | ICD-10-CM

## 2024-06-12 DIAGNOSIS — L299 Pruritus, unspecified: Secondary | ICD-10-CM | POA: Diagnosis present

## 2024-06-12 DIAGNOSIS — I739 Peripheral vascular disease, unspecified: Secondary | ICD-10-CM | POA: Diagnosis not present

## 2024-06-12 DIAGNOSIS — R310 Gross hematuria: Secondary | ICD-10-CM | POA: Diagnosis present

## 2024-06-12 DIAGNOSIS — Z888 Allergy status to other drugs, medicaments and biological substances status: Secondary | ICD-10-CM

## 2024-06-12 DIAGNOSIS — R569 Unspecified convulsions: Secondary | ICD-10-CM | POA: Diagnosis not present

## 2024-06-12 DIAGNOSIS — Z9079 Acquired absence of other genital organ(s): Secondary | ICD-10-CM

## 2024-06-12 DIAGNOSIS — Z801 Family history of malignant neoplasm of trachea, bronchus and lung: Secondary | ICD-10-CM

## 2024-06-12 LAB — COMPREHENSIVE METABOLIC PANEL WITH GFR
ALT: 12 U/L (ref 0–44)
AST: 19 U/L (ref 15–41)
Albumin: 3.1 g/dL — ABNORMAL LOW (ref 3.5–5.0)
Alkaline Phosphatase: 64 U/L (ref 38–126)
Anion gap: 10 (ref 5–15)
BUN: 15 mg/dL (ref 8–23)
CO2: 23 mmol/L (ref 22–32)
Calcium: 8.7 mg/dL — ABNORMAL LOW (ref 8.9–10.3)
Chloride: 101 mmol/L (ref 98–111)
Creatinine, Ser: 0.7 mg/dL (ref 0.61–1.24)
GFR, Estimated: >60 mL/min (ref >60)
Glucose, Bld: 121 mg/dL — ABNORMAL HIGH (ref 70–99)
Potassium: 3.7 mmol/L (ref 3.5–5.1)
Sodium: 134 mmol/L — ABNORMAL LOW (ref 135–145)
Total Bilirubin: 0.8 mg/dL (ref 0.0–1.2)
Total Protein: 6.5 g/dL (ref 6.5–8.1)

## 2024-06-12 LAB — CBC WITH DIFFERENTIAL/PLATELET
Abs Immature Granulocytes: 0.23 10*3/uL — ABNORMAL HIGH (ref 0.00–0.07)
Basophils Absolute: 0.1 10*3/uL (ref 0.0–0.1)
Basophils Relative: 1 %
Eosinophils Absolute: 0 10*3/uL (ref 0.0–0.5)
Eosinophils Relative: 0 %
HCT: 32.8 % — ABNORMAL LOW (ref 39.0–52.0)
Hemoglobin: 10.8 g/dL — ABNORMAL LOW (ref 13.0–17.0)
Immature Granulocytes: 2 %
Lymphocytes Relative: 6 %
Lymphs Abs: 0.9 10*3/uL (ref 0.7–4.0)
MCH: 29.3 pg (ref 26.0–34.0)
MCHC: 32.9 g/dL (ref 30.0–36.0)
MCV: 89.1 fL (ref 80.0–100.0)
Monocytes Absolute: 1 10*3/uL (ref 0.1–1.0)
Monocytes Relative: 7 %
Neutro Abs: 11.8 10*3/uL — ABNORMAL HIGH (ref 1.7–7.7)
Neutrophils Relative %: 84 %
Platelets: 176 10*3/uL (ref 150–400)
RBC: 3.68 MIL/uL — ABNORMAL LOW (ref 4.22–5.81)
RDW: 13.6 % (ref 11.5–15.5)
WBC: 14 10*3/uL — ABNORMAL HIGH (ref 4.0–10.5)
nRBC: 0 % (ref 0.0–0.2)

## 2024-06-12 LAB — PROTIME-INR
INR: 1.1 (ref 0.8–1.2)
Prothrombin Time: 15.1 s (ref 11.4–15.2)

## 2024-06-12 LAB — D-DIMER, QUANTITATIVE: D-Dimer, Quant: >20 ug{FEU}/mL — ABNORMAL HIGH (ref 0.00–0.50)

## 2024-06-12 LAB — TROPONIN I (HIGH SENSITIVITY)
Troponin I (High Sensitivity): 99 ng/L — ABNORMAL HIGH (ref <18)
Troponin I (High Sensitivity): 86 ng/L — ABNORMAL HIGH (ref <18)

## 2024-06-12 LAB — BRAIN NATRIURETIC PEPTIDE: B Natriuretic Peptide: 169 pg/mL — ABNORMAL HIGH (ref 0.0–100.0)

## 2024-06-12 LAB — APTT: aPTT: 41 s — ABNORMAL HIGH (ref 24–36)

## 2024-06-12 LAB — TSH: TSH: 0.937 u[IU]/mL (ref 0.350–4.500)

## 2024-06-12 MED ORDER — IOHEXOL 350 MG/ML SOLN
75.0000 mL | Freq: Once | INTRAVENOUS | Status: AC | PRN
  Administered 2024-06-12: 75 mL via INTRAVENOUS

## 2024-06-12 MED ORDER — ONDANSETRON HCL 4 MG PO TABS: 4.0000 mg | ORAL_TABLET | Freq: Four times a day (QID) | ORAL | Status: DC | PRN

## 2024-06-12 MED ORDER — FUROSEMIDE 40 MG PO TABS
40.0000 mg | ORAL_TABLET | Freq: Once | ORAL | Status: AC
  Administered 2024-06-12: 40 mg via ORAL
  Filled 2024-06-12: qty 1

## 2024-06-12 MED ORDER — CIPROFLOXACIN HCL 250 MG PO TABS
500.0000 mg | ORAL_TABLET | Freq: Two times a day (BID) | ORAL | Status: DC
  Administered 2024-06-12 – 2024-06-13 (×2): 500 mg via ORAL
  Filled 2024-06-12 (×2): qty 2

## 2024-06-12 MED ORDER — ONDANSETRON HCL 4 MG/2ML IJ SOLN
4.0000 mg | Freq: Four times a day (QID) | INTRAMUSCULAR | Status: DC | PRN
  Administered 2024-06-19: 4 mg via INTRAVENOUS
  Filled 2024-06-12: qty 2

## 2024-06-12 MED ORDER — HEPARIN (PORCINE) 25000 UT/250ML-% IV SOLN
1000.0000 [IU]/h | INTRAVENOUS | Status: DC
  Administered 2024-06-12: 800 [IU]/h via INTRAVENOUS
  Filled 2024-06-12: qty 250

## 2024-06-12 MED ORDER — ASPIRIN 81 MG PO CHEW
81.0000 mg | CHEWABLE_TABLET | Freq: Every day | ORAL | Status: DC
  Administered 2024-06-13 – 2024-06-19 (×7): 81 mg via ORAL
  Filled 2024-06-12 (×7): qty 1

## 2024-06-12 MED ORDER — ACETAMINOPHEN 650 MG RE SUPP: 650.0000 mg | Freq: Four times a day (QID) | RECTAL | Status: DC | PRN

## 2024-06-12 MED ORDER — ASPIRIN 81 MG PO CHEW
243.0000 mg | CHEWABLE_TABLET | Freq: Once | ORAL | Status: DC
  Filled 2024-06-12: qty 3

## 2024-06-12 MED ORDER — FINASTERIDE 5 MG PO TABS
5.0000 mg | ORAL_TABLET | Freq: Every evening | ORAL | Status: DC
  Administered 2024-06-13 – 2024-07-03 (×20): 5 mg via ORAL
  Filled 2024-06-12 (×20): qty 1

## 2024-06-12 MED ORDER — FUROSEMIDE 20 MG PO TABS: 20.0000 mg | ORAL_TABLET | Freq: Every day | ORAL | Status: DC | PRN

## 2024-06-12 MED ORDER — ACETAMINOPHEN 325 MG PO TABS
650.0000 mg | ORAL_TABLET | Freq: Four times a day (QID) | ORAL | Status: DC | PRN
Start: 2024-06-12 — End: 2024-06-26
  Administered 2024-06-13 – 2024-06-19 (×5): 650 mg via ORAL
  Filled 2024-06-12 (×5): qty 2

## 2024-06-12 MED ORDER — ROSUVASTATIN CALCIUM 20 MG PO TABS
40.0000 mg | ORAL_TABLET | Freq: Every day | ORAL | Status: DC
  Administered 2024-06-13 – 2024-06-27 (×14): 40 mg via ORAL
  Filled 2024-06-12 (×14): qty 2

## 2024-06-12 MED ORDER — HEPARIN BOLUS VIA INFUSION
4000.0000 [IU] | Freq: Once | INTRAVENOUS | Status: AC
  Administered 2024-06-12: 4000 [IU] via INTRAVENOUS

## 2024-06-12 MED ORDER — POLYETHYLENE GLYCOL 3350 17 G PO PACK: 17.0000 g | PACK | Freq: Every day | ORAL | Status: DC | PRN

## 2024-06-12 MED ORDER — LORAZEPAM 2 MG/ML IJ SOLN
0.5000 mg | INTRAMUSCULAR | Status: AC
  Administered 2024-06-12: 0.5 mg via INTRAVENOUS
  Filled 2024-06-12: qty 1

## 2024-06-12 MED ORDER — PANTOPRAZOLE SODIUM 40 MG IV SOLR
40.0000 mg | INTRAVENOUS | Status: DC
  Administered 2024-06-12 – 2024-06-25 (×12): 40 mg via INTRAVENOUS
  Filled 2024-06-12 (×12): qty 10

## 2024-06-12 NOTE — Assessment & Plan Note (Addendum)
 Recent prostate surgery for BPH-underwent robotic assisted prostatectomy 6/4 by Dr. Patrcia.  He is no longer on chronic Foley. - Resume finasteride 

## 2024-06-12 NOTE — ED Notes (Signed)
 ED TO INPATIENT HANDOFF REPORT  ED Nurse Name and Phone #: Rod RN [phone]  S Name/Age/Gender Kyle Matthews 78 y.o. male Room/Bed: APA18/APA18  Code Status   Code Status: Prior  Home/SNF/Other Home Patient oriented to: self, place, time, and situation Is this baseline? Yes   Triage Complete: Triage complete  Chief Complaint Chest pain [R07.9]  Triage Note Pt BIB [facility] from home with c/o chest pain, pt reports CABG 10/2023, n/v earlier, took 2 SL nitroglycerin , pt c/o bilateral feet swelling x 3 days, Right bundle block on EKG, v/s WNL   Allergies Allergies  Allergen Reactions   Tetanus Toxoids Other (See Comments)    Childhood Allergy    Zetia  [Ezetimibe ] Diarrhea    Level of Care/Admitting Diagnosis ED Disposition     ED Disposition  Admit   Condition  --   Comment  Hospital Area: [facility] [[id number]]  Level of Care: Telemetry [5]  Covid Evaluation: Asymptomatic - no recent exposure (last 10 days) testing not required  Diagnosis: Chest pain [255200]  Admitting Physician: EMOKPAE, EJIROGHENE E [[id number]  Attending Physician: EMOKPAE, EJIROGHENE E Evelynn.Filler          B Medical/Surgery History Past Medical History:  Diagnosis Date   Anemia    Anxiety    Arthritis    BPH (benign prostatic hyperplasia)    Status post biopsy in 2009. 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   Procedure: AORTIC VALVE REPLACEMENT (AVR) USING 23 MM INSPIRIS RESILIA AORTIC VALVE;  Surgeon: Lucas Dorise POUR, MD;  Location: [facility] OR;  Service: Open Heart Surgery;  Laterality: N/A;   CORONARY  ARTERY BYPASS GRAFT N/A 11/02/2023   Procedure: CORONARY ARTERY BYPASS GRAFTING (CABG) X TWO USING LEFT INTERNAL MAMMARY ARTERY AND RIGHT GREATER SAPHENEOUS VEIN HARVESTED ENDOSCOPICLY;  Surgeon: Lucas Dorise POUR, MD;  Location: [facility] OR;  Service: Open Heart Surgery;  Laterality: N/A;   CORONARY PRESSURE/FFR STUDY N/A 10/09/2023   Procedure: CORONARY PRESSURE/FFR STUDY;  Surgeon: Wendel Lurena POUR, MD;  Location: [facility] INVASIVE CV LAB;  Service: Cardiovascular;  Laterality: N/A;   IR ANGIOGRAM PELVIS SELECTIVE OR SUPRASELECTIVE  10/27/2023   IR ANGIOGRAM PELVIS SELECTIVE OR SUPRASELECTIVE  10/27/2023   IR ANGIOGRAM SELECTIVE EACH ADDITIONAL VESSEL  10/27/2023   IR ANGIOGRAM SELECTIVE EACH ADDITIONAL VESSEL  10/27/2023   IR ANGIOGRAM SELECTIVE EACH ADDITIONAL VESSEL  10/27/2023   IR ANGIOGRAM SELECTIVE EACH ADDITIONAL VESSEL  10/27/2023   IR EMBO ARTERIAL NOT HEMORR HEMANG INC GUIDE ROADMAPPING  10/27/2023   IR US  GUIDE VASC ACCESS RIGHT  10/27/2023   RIGHT/LEFT HEART CATH AND CORONARY ANGIOGRAPHY N/A 10/09/2023   Procedure: RIGHT/LEFT HEART CATH AND CORONARY ANGIOGRAPHY;  Surgeon: Wendel Lurena POUR, MD;  Location: [facility] INVASIVE CV LAB;  Service: Cardiovascular;  Laterality: N/A;   TEE WITHOUT CARDIOVERSION N/A 11/02/2023   Procedure: TRANSESOPHAGEAL ECHOCARDIOGRAM;  Surgeon: Lucas Dorise POUR, MD;  Location: [facility] OR;  Service: Open Heart Surgery;  Laterality: N/A;   TRANSTHORACIC ECHOCARDIOGRAM  04/28/2017    EF 65-70%. GR 1 DD. No  regional wall motion abnormality. Moderate aortic stenosis with moderate regurgitation. Mean gradient 34 mmHg.   XI ROBOTIC ASSISTED SIMPLE PROSTATECTOMY N/A 05/22/2024   Procedure: PROSTATECTOMY, SIMPLE, ROBOT-ASSISTED;  Surgeon: Alvaro Ricardo KATHEE Mickey., MD;  Location: [facility] ORS;  Service: Urology;  Laterality: N/A;     A IV Location/Drains/Wounds Patient Lines/Drains/Airways Status     Active Line/Drains/Airways     Name Placement date Placement time Site Days   Peripheral IV 06/12/24  20 G Left Hand 06/12/24  1657  Hand  less than 1   Peripheral IV 06/12/24 20 G 1 Anterior;Right Forearm 06/12/24  1722  Forearm  less than 1   Urethral Catheter DR [facility] Latex;Triple-lumen 24 Fr. 05/22/24  1423  Latex;Triple-lumen  21   Incision - 5 Ports Abdomen 1: Left;Lateral;Upper 2: Superior;Umbilicus Right;Lateral;Lower 4: Right;Medial;Upper 5: Right;Lateral;Upper 05/22/24  1305  -- 21            Intake/Output Last 24 hours No intake or output data in the 24 hours ending 06/12/24 2221  Labs/Imaging Results for orders placed or performed during the hospital encounter of 06/12/24 (from the past 48 hours)  Comprehensive metabolic panel     Status: Abnormal   Collection Time: 06/12/24  6:19 PM  Result Value Ref Range   Sodium 134 (L) 135 - 145 mmol/L   Potassium 3.7 3.5 - 5.1 mmol/L   Chloride 101 98 - 111 mmol/L   CO2 23 22 - 32 mmol/L   Glucose, Bld 121 (H) 70 - 99 mg/dL    Comment: Glucose reference range applies only to samples taken after fasting for at least 8 hours.   BUN 15 8 - 23 mg/dL   Creatinine, Ser 9.29 0.61 - 1.24 mg/dL   Calcium  8.7 (L) 8.9 - 10.3 mg/dL   Total Protein 6.5 6.5 - 8.1 g/dL   Albumin  3.1 (L) 3.5 - 5.0 g/dL   AST 19 15 - 41 U/L   ALT 12 0 - 44 U/L   Alkaline Phosphatase 64 38 - 126 U/L   Total Bilirubin 0.8 0.0 - 1.2 mg/dL   GFR, Estimated >39 >39 mL/min    Comment: (NOTE) Calculated using the CKD-EPI Creatinine Equation (2021)    Anion gap 10 5 - 15    Comment: Performed at [facility], [address] [place], [place] [address]  CBC with Differential     Status: Abnormal   Collection Time: 06/12/24  6:19 PM  Result Value Ref Range   WBC 14.0 (H) 4.0 - 10.5 K/uL   RBC 3.68 (L) 4.22 - 5.81 MIL/uL   Hemoglobin 10.8 (L) 13.0 - 17.0 g/dL   HCT 67.1 (L) 60.9 - 47.9 %   MCV 89.1 80.0 - 100.0 fL   MCH 29.3 26.0 - 34.0 pg   MCHC 32.9 30.0 - 36.0 g/dL   RDW 86.3 88.4 - 84.4 %   Platelets 176 150 - 400 K/uL   nRBC 0.0 0.0 - 0.2 %    Neutrophils Relative % 84 %   Neutro Abs 11.8 (H) 1.7 - 7.7 K/uL   Lymphocytes Relative 6 %   Lymphs Abs 0.9 0.7 - 4.0 K/uL   Monocytes Relative 7 %   Monocytes Absolute 1.0 0.1 - 1.0 K/uL   Eosinophils Relative 0 %   Eosinophils Absolute 0.0 0.0 - 0.5 K/uL   Basophils Relative 1 %   Basophils Absolute 0.1 0.0 - 0.1 K/uL   Immature Granulocytes 2 %   Abs Immature Granulocytes 0.23 (H) 0.00 -  0.07 K/uL    Comment: Performed at [facility], [address], [place], [place] [address]  Troponin I (High Sensitivity)     Status: Abnormal   Collection Time: 06/12/24  6:19 PM  Result Value Ref Range   Troponin I (High Sensitivity) 99 (H) <18 ng/L    Comment: (NOTE) Elevated high sensitivity troponin I (hsTnI) values and significant  changes across serial measurements may suggest ACS but many other  chronic and acute conditions are known to elevate hsTnI results.  Refer to the Links section for chest pain algorithms and additional  guidance. Performed at [facility], [address], [place], [place] [address]   D-dimer, quantitative     Status: Abnormal   Collection Time: 06/12/24  6:19 PM  Result Value Ref Range   D-Dimer, Quant >20.00 (H) 0.00 - 0.50 ug/mL-FEU    Comment: (NOTE) At the manufacturer cut-off value of 0.5 g/mL FEU, this assay has a negative predictive value of 95-100%.This assay is intended for use in conjunction with a clinical pretest probability (PTP) assessment model to exclude pulmonary embolism (PE) and deep venous thrombosis (DVT) in outpatients suspected of PE or DVT. Results should be correlated with clinical presentation. Performed at [facility], [address] [place], [place] [address]   Brain natriuretic peptide     Status: Abnormal   Collection Time: 06/12/24  6:19 PM  Result Value Ref Range   B Natriuretic Peptide 169.0 (H) 0.0 - 100.0 pg/mL    Comment: Performed at [facility], [address], [place], [place] [address]  APTT     Status: Abnormal    Collection Time: 06/12/24  6:19 PM  Result Value Ref Range   aPTT 41 (H) 24 - 36 seconds    Comment:        IF BASELINE aPTT IS ELEVATED, SUGGEST PATIENT RISK ASSESSMENT BE USED TO DETERMINE APPROPRIATE ANTICOAGULANT THERAPY. Performed at [facility], [address], [place], [place] [address]   Protime-INR     Status: None   Collection Time: 06/12/24  6:19 PM  Result Value Ref Range   Prothrombin Time 15.1 11.4 - 15.2 seconds   INR 1.1 0.8 - 1.2    Comment: (NOTE) INR goal varies based on device and disease states. Performed at [facility], [address], [place], [place] [address]   Troponin I (High Sensitivity)     Status: Abnormal   Collection Time: 06/12/24  7:28 PM  Result Value Ref Range   Troponin I (High Sensitivity) 86 (H) <18 ng/L    Comment: (NOTE) Elevated high sensitivity troponin I (hsTnI) values and significant  changes across serial measurements may suggest ACS but many other  chronic and acute conditions are known to elevate hsTnI results.  Refer to the Links section for chest pain algorithms and additional  guidance. Performed at [facility], [address] [place], [place] [address]    CT Angio Chest PE W and/or Wo Contrast Result Date: 06/12/2024 CLINICAL DATA:  Positive D-dimer and chest pain, initial encounter EXAM: CT ANGIOGRAPHY CHEST WITH CONTRAST TECHNIQUE: Multidetector CT imaging of the chest was performed using the standard protocol during bolus administration of intravenous contrast. Multiplanar CT image reconstructions and MIPs were obtained to evaluate the vascular anatomy. RADIATION DOSE REDUCTION: This exam was performed according to the departmental dose-optimization program which includes automated exposure control, adjustment of the mA and/or kV according to patient size and/or use of iterative reconstruction technique. CONTRAST:  75mL OMNIPAQUE  IOHEXOL  350 MG/ML SOLN  COMPARISON:  Chest x-ray from earlier in the same day. CT from  10/10/2023 FINDINGS: Cardiovascular: Atherosclerotic calcifications of the aorta are noted. No aneurysmal dilatation is seen. 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 Coronary calcifications are noted. The pulmonary artery shows a normal branching pattern bilaterally. No filling defect to suggest pulmonary embolism is noted. Mediastinum/Nodes: Thoracic inlet is within normal limits. No hilar or mediastinal adenopathy is noted. The esophagus as visualized is within normal limits. Lungs/Pleura: Lungs are well aerated bilaterally. Mild emphysematous changes are seen. Parenchymal nodule is seen. No infiltrate or effusion is noted. Upper Abdomen: Visualized upper abdomen demonstrates stable simple cyst within the right lobe of the liver. Musculoskeletal: Degenerative changes of the thoracic spine are seen. No acute bony abnormality is noted. Review of the MIP images confirms the above findings. IMPRESSION: No evidence of pulmonary embolism. No acute abnormality seen. Aortic Atherosclerosis (ICD10-I70.0) and Emphysema (ICD10-J43.9). Electronically Signed   By: Oneil Devonshire M.D.   On: 06/12/2024 19:24   DG Chest Port 1 View Result Date: 06/12/2024 CLINICAL DATA:  Chest pain EXAM: PORTABLE CHEST 1 VIEW COMPARISON:  05/07/2024 FINDINGS: Prior CABG and valve replacement. Heart and mediastinal contours are within normal limits. No focal opacities or effusions. No acute bony abnormality. Aortic atherosclerosis. IMPRESSION: No active cardiopulmonary disease. Electronically Signed   By: Franky Crease M.D.   On: 06/12/2024 18:05    Pending Labs Unresulted Labs (From admission, onward)     Start     Ordered   06/13/24 0500  CBC  Tomorrow morning,   R        06/12/24 2050   06/13/24 0500  Heparin  level (unfractionated)  Once-Timed,   URGENT        06/12/24 2114   06/12/24 2220  TSH  Add-on,   AD        06/12/24 2219            Vitals/Pain Today's Vitals   06/12/24 2149 06/12/24 2215 06/12/24 2217  06/12/24 2218  BP:  (!) 102/53    Pulse: 98 (!) 122 (!) 109   Resp: (!) 29 18 20    Temp:    98.6 F (37 C)  TempSrc:    Oral  SpO2: 95% 99% 98%   Weight:      Height:      PainSc:        Isolation Precautions No active isolations  Medications Medications  aspirin  chewable tablet 243 mg (243 mg Oral Not Given 06/12/24 2112)  heparin  bolus via infusion 4,000 Units (4,000 Units Intravenous Bolus from Bag 06/12/24 2111)    Followed by  heparin  ADULT infusion 100 units/mL (25000 units/250mL) (800 Units/hr Intravenous New Bag/Given 06/12/24 2110)  iohexol  (OMNIPAQUE ) 350 MG/ML injection 75 mL (75 mLs Intravenous Contrast Given 06/12/24 1911)  furosemide  (LASIX ) tablet 40 mg (40 mg Oral Given 06/12/24 2107)    Mobility walks     Focused Assessments Cardiac Assessment Handoff:  Cardiac Rhythm: Bundle branch block No results found for: CKTOTAL, CKMB, CKMBINDEX, TROPONINI Lab Results  Component Value Date   DDIMER >20.00 (H) 06/12/2024   Does the Patient currently have chest pain? No   , Neuro Assessment Handoff:  Swallow screen pass? Yes  Cardiac Rhythm: Bundle branch block       Neuro Assessment:   Neuro Checks:      Has TPA been given? No If patient is a Neuro Trauma and patient is going to OR before floor call report to 4N  Charge nurse: [phone] or [phone]   R Recommendations: See Admitting Provider Note  Report given to:   Additional Notes: -

## 2024-06-12 NOTE — H&P (Addendum)
 History and Physical    Markeith Jue FMW:[id number] DOB: December 18, 1946 DOA: 06/12/2024  PCP: Verena Mems, MD   Patient coming from: Home  I have personally briefly reviewed patient's old medical records in [facility]  Chief Complaint: Chest pain, Leg swelling  HPI: Hamlet Lasecki is a 78 y.o. 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 Patient was brought to the ED via EMS reports of chest pain.  This morning at about 5 AM, midsternal-where his scar is, burning in sensation, nonradiating.  He reports chest pain is similar to his chest pain prior to his CABG, but this is not as intense.  He took nitro but this did not help.  Reports bilateral lower extremity swelling over the past few days. He has had intermittent episodes almost twice daily of shaking chills.  He reports the first episode was just after his cardiac surgery, but over the past 2 weeks he has had them frequently.  Episodes typically last 15 to 20 minutes.  This is second 1 today. He had prostate surgery about 3 weeks ago. He recently had a chronic Foley but this has been removed.  He was started on a course of antibiotics by his outpatient provider about a week ago, for occult infection.  He has 4 more doses left.  At home he just piles on some blankets, and this helps.  ED Course: Temperature 98.6.  Heart rate 90-99.  Respiratory rate 18-20.  Blood pressure systolic 108-124.  O2 sats greater than 94% on room air.  Troponin 99 > 86.  D-dimer elevated at greater than 20.  BNP 169. CTA chest-no PE, no acute abnormality. EDP talked to cardiology, recommend admission here for observation echo, cardiology to see in AM. Heparin  drip was started prophylactically with initial troponin result, and in case of possible DVT pending lower extremity venous Dopplers.  Review of Systems: As per HPI all other systems reviewed and negative.  Past Medical History:   Diagnosis Date   Anemia    Anxiety    Arthritis    BPH (benign prostatic hyperplasia)    Status post biopsy in 2009. 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   Stroke (HCC) 2007   No true etiology noted   UTI (urinary tract infection)     Past Surgical History:  Procedure Laterality Date   AORTIC VALVE REPLACEMENT N/A 11/02/2023   Procedure: AORTIC VALVE REPLACEMENT (AVR) USING 23 MM INSPIRIS RESILIA AORTIC VALVE;  Surgeon: Lucas Dorise POUR, MD;  Location: [facility] OR;  Service: Open Heart Surgery;  Laterality: N/A;   CORONARY ARTERY BYPASS GRAFT N/A 11/02/2023   Procedure: CORONARY ARTERY BYPASS GRAFTING (CABG) X TWO USING LEFT INTERNAL MAMMARY ARTERY AND RIGHT GREATER SAPHENEOUS VEIN HARVESTED ENDOSCOPICLY;  Surgeon: Lucas Dorise POUR, MD;  Location: [facility] OR;  Service: Open Heart Surgery;  Laterality: N/A;   CORONARY PRESSURE/FFR STUDY N/A 10/09/2023   Procedure: CORONARY PRESSURE/FFR STUDY;  Surgeon: Wendel Lurena POUR, MD;  Location: [facility] INVASIVE CV LAB;  Service: Cardiovascular;  Laterality: N/A;   IR ANGIOGRAM PELVIS SELECTIVE OR SUPRASELECTIVE  10/27/2023   IR ANGIOGRAM PELVIS SELECTIVE OR SUPRASELECTIVE  10/27/2023   IR ANGIOGRAM SELECTIVE EACH ADDITIONAL VESSEL  10/27/2023   IR ANGIOGRAM SELECTIVE EACH ADDITIONAL VESSEL  10/27/2023   IR ANGIOGRAM SELECTIVE EACH ADDITIONAL VESSEL  10/27/2023   IR ANGIOGRAM SELECTIVE EACH ADDITIONAL VESSEL  10/27/2023   IR EMBO ARTERIAL NOT HEMORR HEMANG INC GUIDE ROADMAPPING  10/27/2023   IR US  GUIDE VASC ACCESS RIGHT  10/27/2023   RIGHT/LEFT HEART CATH AND CORONARY ANGIOGRAPHY N/A 10/09/2023   Procedure: RIGHT/LEFT HEART CATH AND CORONARY ANGIOGRAPHY;  Surgeon: Wendel Lurena POUR, MD;  Location: [facility] INVASIVE CV LAB;  Service: Cardiovascular;  Laterality:  N/A;   TEE WITHOUT CARDIOVERSION N/A 11/02/2023   Procedure: TRANSESOPHAGEAL ECHOCARDIOGRAM;  Surgeon: Lucas Dorise POUR, MD;  Location: [facility] OR;  Service: Open Heart Surgery;  Laterality: N/A;   TRANSTHORACIC ECHOCARDIOGRAM  04/28/2017    EF 65-70%. GR 1 DD. No regional wall motion abnormality. Moderate aortic stenosis with moderate regurgitation. Mean gradient 34 mmHg.   XI ROBOTIC ASSISTED SIMPLE PROSTATECTOMY N/A 05/22/2024   Procedure: PROSTATECTOMY, SIMPLE, ROBOT-ASSISTED;  Surgeon: Alvaro Ricardo KATHEE Mickey., MD;  Location: [facility] ORS;  Service: Urology;  Laterality: N/A;     reports that he has been smoking cigarettes. He has a 36 pack-year smoking history. He has never used smokeless tobacco. He reports that he does not currently use alcohol after a past usage of about 6.0 standard drinks of alcohol per week. He reports that he does not use drugs.  Allergies  Allergen Reactions   Tetanus Toxoids Other (See Comments)    Childhood Allergy    Zetia  [Ezetimibe ] Diarrhea    Family History  Problem Relation Age of Onset   Coronary artery disease Mother 8       28. Was diagnosed with a blood clot - suspect that this was a STEMI   Lung cancer Father    Thyroid  disease Sister    Healthy Brother 47   Pancreatic cancer Sister 54   Cancer Maternal Grandmother    Heart disease Maternal Grandfather 47   Cancer Paternal Grandmother    Stroke Paternal Grandfather    Prior to Admission medications   Medication Sig Start Date End Date Taking? Authorizing Provider  amLODipine  (NORVASC ) 5 MG tablet Take 1 tablet (5 mg total) by mouth daily. 10/13/23  Yes Amin, Ankit C, MD  aspirin  81 MG chewable tablet Chew 81 mg by mouth daily. Swallow whole.   Yes [provider]  ciprofloxacin (CIPRO) 500 MG tablet Take 500 mg by mouth 2 (two) times daily.   Yes [provider]  docusate sodium  (COLACE) 100 MG capsule Take 1 capsule (100 mg total) by mouth 2 (two) times daily. Patient taking  differently: Take 100 mg by mouth daily as needed for mild constipation. 05/22/24  Yes Dancy, Alan, PA-C  Ferrous Gluconate (IRON  27 PO) Take 27 mg by mouth daily.   Yes [provider]  finasteride  (PROSCAR ) 5 MG tablet Take 5 mg by mouth every evening.   Yes [provider]  furosemide  (LASIX ) 20 MG tablet TAKE 1 TABLET(20 MG) BY MOUTH DAILY AS NEEDED Patient taking differently: Take 20 mg by mouth daily as needed for fluid or edema. 03/01/24  Yes Thukkani, Arun K, MD  nitroGLYCERIN  (NITROSTAT ) 0.4 MG SL tablet Place 1 tablet (0.4 mg total) under the tongue every 5 (five) minutes as needed for chest pain. 10/19/23  Yes Thukkani, Arun K, MD  rosuvastatin  (CRESTOR ) 40 MG tablet Take 40 mg by mouth daily.   Yes [provider]    Physical Exam: Vitals:   06/12/24 1650  06/12/24 1657 06/12/24 2000  BP: 124/69  108/67  Pulse: 99  90  Resp: 18  20  Temp: 98.6 F (37 C)  98.3 F (36.8 C)  TempSrc: Oral  Oral  SpO2: 97%  94%  Weight:  71 kg   Height:  5' 2 (1.575 m)     Constitutional: Shaking chills/rigors throughout the duration of my exam, appears uncomfortable. Vitals:   06/12/24 1650 06/12/24 1657 06/12/24 2000  BP: 124/69  108/67  Pulse: 99  90  Resp: 18  20  Temp: 98.6 F (37 C)  98.3 F (36.8 C)  TempSrc: Oral  Oral  SpO2: 97%  94%  Weight:  71 kg   Height:  5' 2 (1.575 m)    Eyes: PERRL, lids and conjunctivae normal ENMT: Mucous membranes are moist.  Neck: normal, supple, no masses, no thyromegaly Respiratory: clear to auscultation bilaterally, no wheezing, no crackles. Normal respiratory effort. No accessory muscle use.  Cardiovascular: Regular rate and rhythm, no murmurs / rubs / gallops. No extremity edema.  Abdomen: no tenderness, no masses palpated. No hepatosplenomegaly. Bowel sounds positive.  Musculoskeletal: no clubbing / cyanosis. No joint deformity upper and lower extremities.   Skin: no rashes, lesions, ulcers. No  induration Neurologic: Rigors throughout my exam, but awake and alert answering all questions, following directions, no facial asymmetry, moving extremity spontaneously, speech fluent. Psychiatric: Normal judgment and insight. Alert and oriented x 3. Normal mood.   Labs on Admission: I have personally reviewed following labs and imaging studies  CBC: Recent Labs  Lab 06/12/24 1819  WBC 14.0*  NEUTROABS 11.8*  HGB 10.8*  HCT 32.8*  MCV 89.1  PLT 176   Basic Metabolic Panel: Recent Labs  Lab 06/12/24 1819  NA 134*  K 3.7  CL 101  CO2 23  GLUCOSE 121*  BUN 15  CREATININE 0.70  CALCIUM  8.7*   Liver Function Tests: Recent Labs  Lab 06/12/24 1819  AST 19  ALT 12  ALKPHOS 64  BILITOT 0.8  PROT 6.5  ALBUMIN  3.1*   Coagulation Profile: Recent Labs  Lab 06/12/24 1819  INR 1.1   Urine analysis:    Component Value Date/Time   COLORURINE YELLOW 11/02/2023 0057   APPEARANCEUR CLEAR 11/02/2023 0057   LABSPEC 1.008 11/02/2023 0057   PHURINE 7.0 11/02/2023 0057   GLUCOSEU NEGATIVE 11/02/2023 0057   HGBUR MODERATE (A) 11/02/2023 0057   BILIRUBINUR NEGATIVE 11/02/2023 0057   KETONESUR NEGATIVE 11/02/2023 0057   PROTEINUR NEGATIVE 11/02/2023 0057   NITRITE NEGATIVE 11/02/2023 0057   LEUKOCYTESUR MODERATE (A) 11/02/2023 0057    Radiological Exams on Admission: CT Angio Chest PE W and/or Wo Contrast Result Date: 06/12/2024 CLINICAL DATA:  Positive D-dimer and chest pain, initial encounter EXAM: CT ANGIOGRAPHY CHEST WITH CONTRAST TECHNIQUE: Multidetector CT imaging of the chest was performed using the standard protocol during bolus administration of intravenous contrast. Multiplanar CT image reconstructions and MIPs were obtained to evaluate the vascular anatomy. RADIATION DOSE REDUCTION: This exam was performed according to the departmental dose-optimization program which includes automated exposure control, adjustment of the mA and/or kV according to patient size and/or  use of iterative reconstruction technique. CONTRAST:  75mL OMNIPAQUE  IOHEXOL  350 MG/ML SOLN COMPARISON:  Chest x-ray from earlier in the same day. CT from 10/10/2023 FINDINGS: Cardiovascular: Atherosclerotic calcifications of the aorta are noted. No aneurysmal dilatation is seen. 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 Coronary calcifications are noted. The pulmonary artery shows a normal branching pattern bilaterally.  No filling defect to suggest pulmonary embolism is noted. Mediastinum/Nodes: Thoracic inlet is within normal limits. No hilar or mediastinal adenopathy is noted. The esophagus as visualized is within normal limits. Lungs/Pleura: Lungs are well aerated bilaterally. Mild emphysematous changes are seen. Parenchymal nodule is seen. No infiltrate or effusion is noted. Upper Abdomen: Visualized upper abdomen demonstrates stable simple cyst within the right lobe of the liver. Musculoskeletal: Degenerative changes of the thoracic spine are seen. No acute bony abnormality is noted. Review of the MIP images confirms the above findings. IMPRESSION: No evidence of pulmonary embolism. No acute abnormality seen. Aortic Atherosclerosis (ICD10-I70.0) and Emphysema (ICD10-J43.9). Electronically Signed   By: Oneil Devonshire M.D.   On: 06/12/2024 19:24   DG Chest Port 1 View Result Date: 06/12/2024 CLINICAL DATA:  Chest pain EXAM: PORTABLE CHEST 1 VIEW COMPARISON:  05/07/2024 FINDINGS: Prior CABG and valve replacement. Heart and mediastinal contours are within normal limits. No focal opacities or effusions. No acute bony abnormality. Aortic atherosclerosis. IMPRESSION: No active cardiopulmonary disease. Electronically Signed   By: Franky Crease M.D.   On: 06/12/2024 18:05   EKG: Independently reviewed.  Rate 99, QTc 484, no significant change from prior.  LAFB, old RBBB.  Assessment/Plan Principal Problem:   Chest pain Active Problems:   Chronic heart failure with preserved ejection fraction (HFpEF)  (HCC)   BPH with obstruction/lower urinary tract symptoms   Essential hypertension   S/P CABG x 2   S/P AVR (aortic valve replacement)   Assessment and Plan: * Chest pain Recent CABG 10/2023.  EKG old RBBB without acute changes, troponins 99 > 86.  Took nitro initially without relief.  Chest pain has resolved.  CTA chest negative for PE or other acute abnormality.  No appreciable LE swelling on my evaluation.  D-dimer >20 in the setting of recent prostatectomy. -EDP talked to cardiology, okay to admit here), echo, cardiology to see in a.m. - LE dopplers ordered by EDP -Possible GERD, trial of Protonix  40 daily -Resume aspirin , Crestor  - Heparin  drip was started in ED -prophylactically with initial troponin result, and in case of possible DVT pending lower extremity venous Dopplers.  BPH with obstruction/lower urinary tract symptoms Recent prostate surgery for BPH-underwent robotic assisted prostatectomy 6/4 by Dr. Patrcia.  He is no longer on chronic Foley. - Resume finasteride   Chronic heart failure with preserved ejection fraction (HFpEF) (HCC) Appears stable and compensated. Chest x-ray, CTA chest-clear.  BNP 169. -Resume Lasix  20 mg daily.  Essential hypertension Stable. -Resume Norvasc , Lasix  20 mg daily  Rigors-shaking chills throughout my exam- Temperature was rechecked-febrile, 98.6.  Heart rate 89-99 , and increased to 122 when her rigors started. Leukocytosis of 14.  No signs or symptoms referable to infectious etiology.  A & O X 4. Started on a course of antibiotics by his outpatient provider for possible occult infection causing rigors.  Other etiologies include anxiety, cold environment. - Trial of IV Ativan 0.5 x 1 - Warm environment - Blood cultures - TSH - Complete outpatient course of ciprofloxacin   DVT prophylaxis: Lovenox  Code Status: Full code Family Communication: None at bedside Disposition Plan: ~ 2 days Consults called: Cardiology Admission status: Obs,  telemetry  Author: Tully FORBES Carwin, MD 06/12/2024 10:56 PM  For on call review [email or web address].

## 2024-06-12 NOTE — Assessment & Plan Note (Signed)
 Appears stable and compensated. Chest x-ray, CTA chest-clear.  BNP 169. -Resume Lasix  20 mg daily.

## 2024-06-12 NOTE — ED Provider Notes (Signed)
 [place] EMERGENCY DEPARTMENT AT [facility] Provider Note  CSN: [id number] Arrival date & time: 06/12/24 1640  Chief Complaint(s) Chest Pain  HPI Kyle Matthews is a 78 y.o. male history of coronary artery disease status post CABG, CHF, recent prostate surgery, hypertension, lipidemia presents to the emergency department with chest pain.  Patient reports woke up this morning with episode of chest pain.  Reports the pain was substernal, did not radiate.  Pain was mild but irritable.  Took a nitroglycerin  which did not really help.  No shortness of breath.  Pain not pleuritic.  No cough, runny nose, sore throat.  No abdominal pain or back pain.  No fevers or chills.  Ultimately called paramedics who brought him to the ER.  Has also noticed some bilateral lower extremity swelling over the last week or so, without color change or significant discomfort   Past Medical History Past Medical History:  Diagnosis Date   Anemia    Anxiety    Arthritis    BPH (benign prostatic hyperplasia)    Status post biopsy in 2009. 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   Stroke (HCC) 2007   No true etiology noted   UTI (urinary tract infection)    Patient Active Problem List   Diagnosis Date Noted   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   S/P AVR (aortic valve replacement) 11/02/2023   Chronic heart failure with preserved ejection fraction (HFpEF) (HCC) 11/01/2023   Acute blood loss anemia 10/30/2023   Benign hypertension 10/30/2023   Acute heart failure with preserved ejection fraction (HFpEF) (HCC) 10/28/2023   Atherosclerosis of native coronary artery of native heart without angina pectoris 10/28/2023   Pressure injury of skin  10/28/2023   Chest pain 10/23/2023   History of essential hypertension 10/23/2023   GERD (gastroesophageal reflux disease) 10/23/2023   NSTEMI (non-ST elevated myocardial infarction) (HCC) 10/11/2023   Aortic valve disease 10/11/2023   Coronary artery disease involving native coronary artery of native heart with unstable angina pectoris (HCC) 10/10/2023   Chronic blood loss anemia 10/10/2023   Gross hematuria 10/07/2023   BPH with urinary obstruction 10/04/2023   Acute on chronic diastolic CHF (congestive heart failure) (HCC) 10/04/2023   Symptomatic anemia 10/03/2023   Kidney lesion, native, left 10/03/2023   Acute respiratory failure with hypoxia (HCC) 10/03/2023   Lesion of right native kidney 10/03/2023   Elevated brain natriuretic peptide (BNP) level 10/03/2023   Coronary artery disease 10/03/2023   SOB (shortness of breath) 10/03/2023   Bladder outlet obstruction 10/03/2023   Chronic indwelling Foley catheter 10/03/2023   Hematuria 10/03/2023   Enlarged prostate 10/03/2023   Moderate aortic regurgitation 10/03/2023   Hyperlipidemia 05/20/2017   Nicotine dependence, cigarettes, uncomplicated 05/20/2017   Carotid bruit 05/19/2017   Essential hypertension    RBBB 04/13/2017   Severe aortic stenosis 04/13/2017   Home Medication(s) Prior to Admission medications   Medication Sig Start Date End Date Taking? Authorizing Provider  amLODipine  (NORVASC ) 5 MG tablet Take 1 tablet (5 mg total) by mouth daily. 10/13/23  Yes Amin, Ankit C, MD  aspirin  81 MG chewable tablet Chew 81 mg by mouth daily. Swallow whole.  Yes [provider]  ciprofloxacin (CIPRO) 500 MG tablet Take 500 mg by mouth 2 (two) times daily.   Yes [provider]  docusate sodium  (COLACE) 100 MG capsule Take 1 capsule (100 mg total) by mouth 2 (two) times daily. Patient taking differently: Take 100 mg by mouth daily as needed for mild constipation. 05/22/24  Yes Dancy, Alan, PA-C  Ferrous  Gluconate (IRON  27 PO) Take 27 mg by mouth daily.   Yes [provider]  finasteride  (PROSCAR ) 5 MG tablet Take 5 mg by mouth every evening.   Yes [provider]  furosemide  (LASIX ) 20 MG tablet TAKE 1 TABLET(20 MG) BY MOUTH DAILY AS NEEDED Patient taking differently: Take 20 mg by mouth daily as needed for fluid or edema. 03/01/24  Yes Thukkani, Arun K, MD  nitroGLYCERIN  (NITROSTAT ) 0.4 MG SL tablet Place 1 tablet (0.4 mg total) under the tongue every 5 (five) minutes as needed for chest pain. 10/19/23  Yes Thukkani, Arun K, MD  rosuvastatin  (CRESTOR ) 40 MG tablet Take 40 mg by mouth daily.   Yes [provider]                                                                                                                                    Past Surgical History Past Surgical History:  Procedure Laterality Date   AORTIC VALVE REPLACEMENT N/A 11/02/2023   Procedure: AORTIC VALVE REPLACEMENT (AVR) USING 23 MM INSPIRIS RESILIA AORTIC VALVE;  Surgeon: Lucas Dorise POUR, MD;  Location: [facility] OR;  Service: Open Heart Surgery;  Laterality: N/A;   CORONARY ARTERY BYPASS GRAFT N/A 11/02/2023   Procedure: CORONARY ARTERY BYPASS GRAFTING (CABG) X TWO USING LEFT INTERNAL MAMMARY ARTERY AND RIGHT GREATER SAPHENEOUS VEIN HARVESTED ENDOSCOPICLY;  Surgeon: Lucas Dorise POUR, MD;  Location: [facility] OR;  Service: Open Heart Surgery;  Laterality: N/A;   CORONARY PRESSURE/FFR STUDY N/A 10/09/2023   Procedure: CORONARY PRESSURE/FFR STUDY;  Surgeon: Wendel Lurena POUR, MD;  Location: [facility] INVASIVE CV LAB;  Service: Cardiovascular;  Laterality: N/A;   IR ANGIOGRAM PELVIS SELECTIVE OR SUPRASELECTIVE  10/27/2023   IR ANGIOGRAM PELVIS SELECTIVE OR SUPRASELECTIVE  10/27/2023   IR ANGIOGRAM SELECTIVE EACH ADDITIONAL VESSEL  10/27/2023   IR ANGIOGRAM SELECTIVE EACH ADDITIONAL VESSEL  10/27/2023   IR ANGIOGRAM SELECTIVE EACH ADDITIONAL VESSEL  10/27/2023   IR ANGIOGRAM SELECTIVE EACH ADDITIONAL VESSEL  10/27/2023   IR  EMBO ARTERIAL NOT HEMORR HEMANG INC GUIDE ROADMAPPING  10/27/2023   IR US  GUIDE VASC ACCESS RIGHT  10/27/2023   RIGHT/LEFT HEART CATH AND CORONARY ANGIOGRAPHY N/A 10/09/2023   Procedure: RIGHT/LEFT HEART CATH AND CORONARY ANGIOGRAPHY;  Surgeon: Wendel Lurena POUR, MD;  Location: [facility] INVASIVE CV LAB;  Service: Cardiovascular;  Laterality: N/A;   TEE WITHOUT CARDIOVERSION N/A 11/02/2023   Procedure: TRANSESOPHAGEAL ECHOCARDIOGRAM;  Surgeon: Lucas Dorise POUR, MD;  Location: [facility] OR;  Service: Open Heart Surgery;  Laterality: N/A;  TRANSTHORACIC ECHOCARDIOGRAM  04/28/2017    EF 65-70%. GR 1 DD. No regional wall motion abnormality. Moderate aortic stenosis with moderate regurgitation. Mean gradient 34 mmHg.   XI ROBOTIC ASSISTED SIMPLE PROSTATECTOMY N/A 05/22/2024   Procedure: PROSTATECTOMY, SIMPLE, ROBOT-ASSISTED;  Surgeon: Alvaro Ricardo KATHEE Mickey., MD;  Location: [facility] ORS;  Service: Urology;  Laterality: N/A;   Family History Family History  Problem Relation Age of Onset   Coronary artery disease Mother 70       28. Was diagnosed with a blood clot - suspect that this was a STEMI   Lung cancer Father    Thyroid  disease Sister    Healthy Brother 41   Pancreatic cancer Sister 29   Cancer Maternal Grandmother    Heart disease Maternal Grandfather 6   Cancer Paternal Grandmother    Stroke Paternal Grandfather     Social History Social History   Tobacco Use   Smoking status: Every Day    Current packs/day: 0.00    Average packs/day: 1.5 packs/day for 24.0 years (36.0 ttl pk-yrs)    Types: Cigarettes    Last attempt to quit: 08/21/2023    Years since quitting: 0.8   Smokeless tobacco: Never  Vaping Use   Vaping status: Never Used  Substance Use Topics   Alcohol use: Not Currently    Alcohol/week: 6.0 standard drinks of alcohol    Types: 6 Cans of beer per week   Drug use: No   Allergies Tetanus toxoids and Zetia  [ezetimibe ]  Review of Systems Review of Systems  All other systems reviewed and  are negative.   Physical Exam Vital Signs  I have reviewed the triage vital signs BP 108/67   Pulse 90   Temp 98.3 F (36.8 C) (Oral)   Resp 20   Ht 5' 2 (1.575 m)   Wt 71 kg   SpO2 94%   BMI 28.63 kg/m  Physical Exam Vitals and nursing note reviewed.  Constitutional:      General: He is not in acute distress.    Appearance: Normal appearance.  HENT:     Mouth/Throat:     Mouth: Mucous membranes are moist.   Eyes:     Conjunctiva/sclera: Conjunctivae normal.    Cardiovascular:     Rate and Rhythm: Normal rate and regular rhythm.     Pulses:          Radial pulses are 2+ on the right side and 2+ on the left side.  Pulmonary:     Effort: Pulmonary effort is normal. No respiratory distress.     Breath sounds: Normal breath sounds.  Chest:     Chest wall: No tenderness.  Abdominal:     General: Abdomen is flat.     Palpations: Abdomen is soft.     Tenderness: There is no abdominal tenderness.   Musculoskeletal:     Right lower leg: Edema present.     Left lower leg: Edema present.   Skin:    General: Skin is warm and dry.     Capillary Refill: Capillary refill takes less than 2 seconds.   Neurological:     Mental Status: He is alert and oriented to person, place, and time. Mental status is at baseline.   Psychiatric:        Mood and Affect: Mood normal.        Behavior: Behavior normal.     ED Results and Treatments Labs (all labs ordered are listed, but only abnormal results are  displayed) Labs Reviewed  COMPREHENSIVE METABOLIC PANEL WITH GFR - Abnormal; Notable for the following components:      Result Value   Sodium 134 (*)    Glucose, Bld 121 (*)    Calcium  8.7 (*)    Albumin  3.1 (*)    All other components within normal limits  CBC WITH DIFFERENTIAL/PLATELET - Abnormal; Notable for the following components:   WBC 14.0 (*)    RBC 3.68 (*)    Hemoglobin 10.8 (*)    HCT 32.8 (*)    Neutro Abs 11.8 (*)    Abs Immature Granulocytes 0.23 (*)     All other components within normal limits  D-DIMER, QUANTITATIVE - Abnormal; Notable for the following components:   D-Dimer, Quant >20.00 (*)    All other components within normal limits  BRAIN NATRIURETIC PEPTIDE - Abnormal; Notable for the following components:   B Natriuretic Peptide 169.0 (*)    All other components within normal limits  APTT - Abnormal; Notable for the following components:   aPTT 41 (*)    All other components within normal limits  TROPONIN I (HIGH SENSITIVITY) - Abnormal; Notable for the following components:   Troponin I (High Sensitivity) 99 (*)    All other components within normal limits  TROPONIN I (HIGH SENSITIVITY) - Abnormal; Notable for the following components:   Troponin I (High Sensitivity) 86 (*)    All other components within normal limits  PROTIME-INR  CBC  HEPARIN  LEVEL (UNFRACTIONATED)                                                                                                                          Radiology CT Angio Chest PE W and/or Wo Contrast Result Date: 06/12/2024 CLINICAL DATA:  Positive D-dimer and chest pain, initial encounter EXAM: CT ANGIOGRAPHY CHEST WITH CONTRAST TECHNIQUE: Multidetector CT imaging of the chest was performed using the standard protocol during bolus administration of intravenous contrast. Multiplanar CT image reconstructions and MIPs were obtained to evaluate the vascular anatomy. RADIATION DOSE REDUCTION: This exam was performed according to the departmental dose-optimization program which includes automated exposure control, adjustment of the mA and/or kV according to patient size and/or use of iterative reconstruction technique. CONTRAST:  75mL OMNIPAQUE  IOHEXOL  350 MG/ML SOLN COMPARISON:  Chest x-ray from earlier in the same day. CT from 10/10/2023 FINDINGS: Cardiovascular: Atherosclerotic calcifications of the aorta are noted. No aneurysmal dilatation is seen. Changes of prior aortic valve replacement are  seen. Coronary calcifications are noted. The pulmonary artery shows a normal branching pattern bilaterally. No filling defect to suggest pulmonary embolism is noted. Mediastinum/Nodes: Thoracic inlet is within normal limits. No hilar or mediastinal adenopathy is noted. The esophagus as visualized is within normal limits. Lungs/Pleura: Lungs are well aerated bilaterally. Mild emphysematous changes are seen. Parenchymal nodule is seen. No infiltrate or effusion is noted. Upper Abdomen: Visualized upper abdomen demonstrates stable simple cyst within the right lobe of the liver. Musculoskeletal: Degenerative changes of  the thoracic spine are seen. No acute bony abnormality is noted. Review of the MIP images confirms the above findings. IMPRESSION: No evidence of pulmonary embolism. No acute abnormality seen. Aortic Atherosclerosis (ICD10-I70.0) and Emphysema (ICD10-J43.9). Electronically Signed   By: Oneil Devonshire M.D.   On: 06/12/2024 19:24   DG Chest Port 1 View Result Date: 06/12/2024 CLINICAL DATA:  Chest pain EXAM: PORTABLE CHEST 1 VIEW COMPARISON:  05/07/2024 FINDINGS: Prior CABG and valve replacement. Heart and mediastinal contours are within normal limits. No focal opacities or effusions. No acute bony abnormality. Aortic atherosclerosis. IMPRESSION: No active cardiopulmonary disease. Electronically Signed   By: Franky Crease M.D.   On: 06/12/2024 18:05    Pertinent labs & imaging results that were available during my care of the patient were reviewed by me and considered in my medical decision making (see MDM for details).  Medications Ordered in ED Medications  aspirin  chewable tablet 243 mg (243 mg Oral Not Given 06/12/24 2112)  heparin  bolus via infusion 4,000 Units (4,000 Units Intravenous Bolus from Bag 06/12/24 2111)    Followed by  heparin  ADULT infusion 100 units/mL (25000 units/250mL) (800 Units/hr Intravenous New Bag/Given 06/12/24 2110)  iohexol  (OMNIPAQUE ) 350 MG/ML injection 75 mL (75 mLs  Intravenous Contrast Given 06/12/24 1911)  furosemide  (LASIX ) tablet 40 mg (40 mg Oral Given 06/12/24 2107)                                                                                                                                     Procedures .Critical Care  Performed by: Francesca Elsie CROME, MD Authorized by: Francesca Elsie CROME, MD   Critical care provider statement:    Critical care time (minutes):  30   Critical care was necessary to treat or prevent imminent or life-threatening deterioration of the following conditions:  Cardiac failure   Critical care was time spent personally by me on the following activities:  Development of treatment plan with patient or surrogate, discussions with consultants, evaluation of patient's response to treatment, examination of patient, ordering and review of laboratory studies, ordering and review of radiographic studies, ordering and performing treatments and interventions, pulse oximetry, re-evaluation of patient's condition and review of old charts   Care discussed with: admitting provider     (including critical care time)  Medical Decision Making / ED Course   MDM:  78 year old presenting to the emergency room with episode of chest pain.  Patient overall well-appearing, vital signs reassuring.  EKG with no concerning acute change.  Differential includes ACS, pulmonary embolism given recent surgery, musculoskeletal pain, pneumonia, pneumothorax.  Doubt dissection without radiation of the back, equal pulses.  Doubt esophageal perforation without nausea, vomiting.  Will obtain chest x-ray.  Check troponin.  Given recent surgery will check D-dimer although symptoms seem atypical for pulmonary embolism no tachycardia or hypoxia.  He does have some lower extremity edema which seems less consistent with DVT  but if D-dimer is elevated would also obtain bilateral lower extremity ultrasound.  Patient does have some history of CHF and takes Lasix , may  need to double up his Lasix .  If workup is overall reassuring and patient remains chest pain-free, likely discharge.  Clinical Course as of 06/12/24 2145  Wed Jun 12, 2024  2143 D-dimer > 20, but CTA chest negative for PE. Have ordered BLE US . Discussed symptoms of chest pain with Dr. Donnel with cardiology, recommends admission and cardiology will consult. Discussed with Dr. Pearlean.  [WS]    Clinical Course User Index [WS] Francesca Elsie CROME, MD     Additional history obtained: -Additional history obtained from family and ems -External records from outside source obtained and reviewed including: Chart review including previous notes, labs, imaging, consultation notes including prior notes    Lab Tests: -I ordered, reviewed, and interpreted labs.   The pertinent results include:   Labs Reviewed  COMPREHENSIVE METABOLIC PANEL WITH GFR - Abnormal; Notable for the following components:      Result Value   Sodium 134 (*)    Glucose, Bld 121 (*)    Calcium  8.7 (*)    Albumin  3.1 (*)    All other components within normal limits  CBC WITH DIFFERENTIAL/PLATELET - Abnormal; Notable for the following components:   WBC 14.0 (*)    RBC 3.68 (*)    Hemoglobin 10.8 (*)    HCT 32.8 (*)    Neutro Abs 11.8 (*)    Abs Immature Granulocytes 0.23 (*)    All other components within normal limits  D-DIMER, QUANTITATIVE - Abnormal; Notable for the following components:   D-Dimer, Quant >20.00 (*)    All other components within normal limits  BRAIN NATRIURETIC PEPTIDE - Abnormal; Notable for the following components:   B Natriuretic Peptide 169.0 (*)    All other components within normal limits  APTT - Abnormal; Notable for the following components:   aPTT 41 (*)    All other components within normal limits  TROPONIN I (HIGH SENSITIVITY) - Abnormal; Notable for the following components:   Troponin I (High Sensitivity) 99 (*)    All other components within normal limits  TROPONIN I (HIGH  SENSITIVITY) - Abnormal; Notable for the following components:   Troponin I (High Sensitivity) 86 (*)    All other components within normal limits  PROTIME-INR  CBC  HEPARIN  LEVEL (UNFRACTIONATED)    Notable for elevated d-dimer without PE, elevated troponin  EKG   EKG Interpretation Date/Time:  Wednesday June 12 2024 17:00:52 EDT Ventricular Rate:  99 PR Interval:  178 QRS Duration:  148 QT Interval:  377 QTC Calculation: 484 R Axis:   -51  Text Interpretation: Sinus or ectopic atrial rhythm RBBB and LAFB Compared to previous tracing Nonspecific T wave abnormality has improved rate has increased Confirmed by Francesca Elsie ([id number]) on 06/12/2024 5:30:40 PM         Imaging Studies ordered: I ordered imaging studies including CTA chest On my interpretation imaging demonstrates no PE I independently visualized and interpreted imaging. I agree with the radiologist interpretation   Medicines ordered and prescription drug management: Meds ordered this encounter  Medications   iohexol  (OMNIPAQUE ) 350 MG/ML injection 75 mL   furosemide  (LASIX ) tablet 40 mg   aspirin  chewable tablet 243 mg   FOLLOWED BY Linked Order Group    heparin  bolus via infusion 4,000 Units    heparin  ADULT infusion 100 units/mL (25000 units/250mL)    -I  have reviewed the patients home medicines and have made adjustments as needed   Consultations Obtained: I requested consultation with the cardiologist,  and discussed lab and imaging findings as well as pertinent plan - they recommend: admission   Cardiac Monitoring: The patient was maintained on a cardiac monitor.  I personally viewed and interpreted the cardiac monitored which showed an underlying rhythm of: NSR    Reevaluation: After the interventions noted above, I reevaluated the patient and found that their symptoms have resolved  Co morbidities that complicate the patient evaluation  Past Medical History:  Diagnosis Date   Anemia     Anxiety    Arthritis    BPH (benign prostatic hyperplasia)    Status post biopsy in 2009. 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   Stroke (HCC) 2007   No true etiology noted   UTI (urinary tract infection)       Dispostion: Disposition decision including need for hospitalization was considered, and patient admitted to the hospital.    Final Clinical Impression(s) / ED Diagnoses Final diagnoses:  Chest pain, unspecified type  Congestive heart failure, unspecified HF chronicity, unspecified heart failure type [facility])     This chart was dictated using voice recognition software.  Despite best efforts to proofread,  errors can occur which can change the documentation meaning.    Francesca Elsie CROME, MD 06/12/24 2145

## 2024-06-12 NOTE — Assessment & Plan Note (Addendum)
 Recent CABG 10/2023.  EKG old RBBB without acute changes, troponins 99 > 86.  Took nitro initially without relief.  Chest pain has resolved.  CTA chest negative for PE or other acute abnormality.  No appreciable LE swelling on my evaluation.  D-dimer >20 in the setting of recent prostatectomy. -EDP talked to cardiology, okay to admit here), echo, cardiology to see in a.m. - LE dopplers ordered by EDP -Possible GERD, trial of Protonix  40 daily -Resume aspirin , Crestor  - Heparin  drip was started in ED -prophylactically with initial troponin result, and in case of possible DVT pending lower extremity venous Dopplers.

## 2024-06-12 NOTE — Assessment & Plan Note (Signed)
 Stable. -Resume Norvasc , Lasix  20 mg daily

## 2024-06-12 NOTE — ED Triage Notes (Signed)
 Pt BIB [facility] from home with c/o chest pain, pt reports CABG 10/2023, n/v earlier, took 2 SL nitroglycerin , pt c/o bilateral feet swelling x 3 days, Right bundle block on EKG, v/s WNL

## 2024-06-12 NOTE — Consult Note (Signed)
 PHARMACY - ANTICOAGULATION CONSULT NOTE  Pharmacy Consult for IV Heparin  Indication: chest pain/ACS  Patient Measurements: Height: 5' 2 (157.5 cm) Weight: 71 kg (156 lb 8.4 oz) IBW/kg (Calculated) : 54.6 HEPARIN  DW (KG): 69.1  Labs: Recent Labs    06/12/24 1819 06/12/24 1928  HGB 10.8*  --   HCT 32.8*  --   PLT 176  --   CREATININE 0.70  --   TROPONINIHS 99* 86*    Estimated Creatinine Clearance: 69.1 mL/min (by C-G formula based on SCr of 0.7 mg/dL).   Medical History: Past Medical History:  Diagnosis Date   Anemia    Anxiety    Arthritis    BPH (benign prostatic hyperplasia)    Status post biopsy in 2009. 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   Stroke (HCC) 2007   No true etiology noted   UTI (urinary tract infection)     Medications:  No anticoagulation prior to admission per my chart review. Medication reconciliation is pending  Assessment: 78 y/o M with medical history as above and including CAD s/p CABG presenting with chest pain. Troponin 99 >> 86. Pharmacy consulted to manage heparin  for ACS.  CBC notable for anemia. Baseline aPTT and INR are pending  Goal of Therapy:  Heparin  level 0.3-0.7 units/ml Monitor platelets by anticoagulation protocol: Yes   Plan:  --Heparin  4000 unit IV bolus followed by continuous infusion at 800 units/hr --Heparin  level 8 hours from initiation --Daily CBC per protocol while on IV heparin   Misaki Sozio B Sabria Florido 06/12/2024,8:50 PM

## 2024-06-13 ENCOUNTER — Observation Stay (HOSPITAL_COMMUNITY)

## 2024-06-13 ENCOUNTER — Encounter (HOSPITAL_COMMUNITY): Payer: Self-pay | Admitting: Internal Medicine

## 2024-06-13 ENCOUNTER — Observation Stay (HOSPITAL_COMMUNITY)
Admit: 2024-06-13 | Discharge: 2024-06-13 | Disposition: A | Discharge status: Home / Self Care | Attending: Internal Medicine | Admitting: Internal Medicine

## 2024-06-13 DIAGNOSIS — I2510 Atherosclerotic heart disease of native coronary artery without angina pectoris: Secondary | ICD-10-CM | POA: Diagnosis not present

## 2024-06-13 DIAGNOSIS — I5032 Chronic diastolic (congestive) heart failure: Secondary | ICD-10-CM | POA: Diagnosis not present

## 2024-06-13 DIAGNOSIS — I6523 Occlusion and stenosis of bilateral carotid arteries: Secondary | ICD-10-CM | POA: Diagnosis not present

## 2024-06-13 DIAGNOSIS — M7989 Other specified soft tissue disorders: Secondary | ICD-10-CM | POA: Diagnosis not present

## 2024-06-13 DIAGNOSIS — R079 Chest pain, unspecified: Secondary | ICD-10-CM

## 2024-06-13 DIAGNOSIS — R569 Unspecified convulsions: Secondary | ICD-10-CM | POA: Diagnosis not present

## 2024-06-13 DIAGNOSIS — E042 Nontoxic multinodular goiter: Secondary | ICD-10-CM | POA: Diagnosis not present

## 2024-06-13 DIAGNOSIS — R000 Tachycardia, unspecified: Secondary | ICD-10-CM

## 2024-06-13 DIAGNOSIS — R7881 Bacteremia: Secondary | ICD-10-CM | POA: Diagnosis not present

## 2024-06-13 DIAGNOSIS — I350 Nonrheumatic aortic (valve) stenosis: Secondary | ICD-10-CM | POA: Diagnosis not present

## 2024-06-13 DIAGNOSIS — I639 Cerebral infarction, unspecified: Secondary | ICD-10-CM | POA: Diagnosis not present

## 2024-06-13 DIAGNOSIS — I10 Essential (primary) hypertension: Secondary | ICD-10-CM | POA: Diagnosis not present

## 2024-06-13 DIAGNOSIS — I700 Atherosclerosis of aorta: Secondary | ICD-10-CM | POA: Diagnosis not present

## 2024-06-13 DIAGNOSIS — R6510 Systemic inflammatory response syndrome (SIRS) of non-infectious origin without acute organ dysfunction: Secondary | ICD-10-CM

## 2024-06-13 LAB — CBC
HCT: 32.5 % — ABNORMAL LOW (ref 39.0–52.0)
Hemoglobin: 10.8 g/dL — ABNORMAL LOW (ref 13.0–17.0)
MCH: 29.6 pg (ref 26.0–34.0)
MCHC: 33.2 g/dL (ref 30.0–36.0)
MCV: 89 fL (ref 80.0–100.0)
Platelets: 177 10*3/uL (ref 150–400)
RBC: 3.65 MIL/uL — ABNORMAL LOW (ref 4.22–5.81)
RDW: 13.6 % (ref 11.5–15.5)
WBC: 14 10*3/uL — ABNORMAL HIGH (ref 4.0–10.5)
nRBC: 0 % (ref 0.0–0.2)

## 2024-06-13 LAB — HEPARIN LEVEL (UNFRACTIONATED): Heparin Unfractionated: 0.14 [IU]/mL — ABNORMAL LOW (ref 0.30–0.70)

## 2024-06-13 LAB — BASIC METABOLIC PANEL WITH GFR
Anion gap: 10 (ref 5–15)
BUN: 12 mg/dL (ref 8–23)
CO2: 25 mmol/L (ref 22–32)
Calcium: 8.6 mg/dL — ABNORMAL LOW (ref 8.9–10.3)
Chloride: 100 mmol/L (ref 98–111)
Creatinine, Ser: 0.64 mg/dL (ref 0.61–1.24)
GFR, Estimated: >60 mL/min (ref >60)
Glucose, Bld: 105 mg/dL — ABNORMAL HIGH (ref 70–99)
Potassium: 3.1 mmol/L — ABNORMAL LOW (ref 3.5–5.1)
Sodium: 135 mmol/L (ref 135–145)

## 2024-06-13 LAB — LACTIC ACID, PLASMA: Lactic Acid, Venous: 1.1 mmol/L (ref 0.5–1.9)

## 2024-06-13 LAB — CORTISOL: Cortisol, Plasma: 12.4 ug/dL

## 2024-06-13 LAB — MAGNESIUM: Magnesium: 1.9 mg/dL (ref 1.7–2.4)

## 2024-06-13 LAB — PROCALCITONIN: Procalcitonin: <0.1 ng/mL

## 2024-06-13 LAB — CK: Total CK: 12 U/L — ABNORMAL LOW (ref 49–397)

## 2024-06-13 MED ORDER — POTASSIUM CHLORIDE CRYS ER 20 MEQ PO TBCR
40.0000 meq | EXTENDED_RELEASE_TABLET | Freq: Once | ORAL | Status: AC
  Administered 2024-06-13: 40 meq via ORAL
  Filled 2024-06-13: qty 2

## 2024-06-13 MED ORDER — HEPARIN BOLUS VIA INFUSION
2000.0000 [IU] | Freq: Once | INTRAVENOUS | Status: AC
  Administered 2024-06-13: 2000 [IU] via INTRAVENOUS
  Filled 2024-06-13: qty 2000

## 2024-06-13 MED ORDER — IOHEXOL 350 MG/ML SOLN
75.0000 mL | Freq: Once | INTRAVENOUS | Status: AC | PRN
  Administered 2024-06-13: 75 mL via INTRAVENOUS

## 2024-06-13 NOTE — Consult Note (Addendum)
 Cardiology Consultation   Patient ID: Kyle Matthews MRN: [id number]; DOB: 28-Mar-1946  Admit date: 06/12/2024 Date of Consult: 06/13/2024  PCP:  Verena Mems, MD   [facility] Providers Cardiologist:  Lurena MARLA Red, MD        Patient Profile: Kyle Matthews is a 78 y.o. male with a hx of CAD (s/p CABG in 10/2023 with LIMA-LAD and SVG-Ramus at the time of AVR), severe AS (s/p AVR with 23 mm Edwards Inspiris Resilia pericardial valve in 10/2023), post-op atrial fibrillation following CABG and AVR, HTN, HLD, history of CVA, COPD, tobacco use, anemia and BPH who is being seen 06/13/2024 for the evaluation of chest pain and elevated troponin values at the request of Dr. CHARLENA Carwin.  History of Present Illness:  Kyle Matthews was last examined by Dr. Red in 01/2024 and reported still having ongoing hematuria and was using a urinary catheter.  Was planning to undergo prostatectomy with Urology as he had previously required prostate artery embolization prior to undergoing CABG and AVR in 10/2023. He denied any recent anginal symptoms.  Was continued on ASA 81 mg daily, Amlodipine  5 mg daily, Lasix  20 mg as needed and Crestor  40 mg daily. He did have a preoperative telehealth cardiac clearance appt for prostatectomy on 05/03/2024. He was remaining active at that time and denied any anginal symptoms. Was cleared to proceed and informed hold ASA for 5 to 7 days prior to the procedure and resume as soon as possible postprocedure.  He did undergo robot-assisted prostatectomy by Dr. Alvaro on 05/22/2024 with no immediate complications noted and was discharged home the following day.  He presented to [facility] ED on 06/12/2024 for evaluation of chest pain and lower extremity swelling. Also reported frequent chills but had overall been afebrile. Initial labs showed WBC 14.0, Hgb 10.8 (previously 11.5 in 05/23/2024), platelets 176, Na+ 134, K+ 3.7 and creatinine 0.70. Albumin  low at 3.1. AST 19 and ALT 12. TSH  0.937. D-dimer elevated at greater than 20. Initial and repeat  Hs troponin values at 99 and 86. BNP mildly elevated at 169. Procalcitonin, urine culture and lactic acid are pending. CXR with no active cardiopulmonary disease. CTA showed no evidence of PE. Lower extremity dopplers and echocardiogram scheduled for later today.  He was initially started on Cipro but this has been discontinued. Was started on IV Heparin  preemptively given his elevated D-dimer and troponin values.  In talking with the patient today, he reports initially doing well after his surgery but activity has overall been limited around his home. Says the most active thing he typically does is walk to his porch to feed birds. Denies any specific exertional chest pain or dyspnea on exertion.  Starting this past Tuesday, he developed chest pain while sitting and reports it was a constant, aching sensation which would last for several hours. Pain was not worse with exertion or positional changes. Reports having recurrent pain yesterday which prompted him to come to the ED. Reports pain lasted for at least 8-10+ hours and then spontaneously resolved. No specific orthopnea or PND. Has noticed worsening lower extremity edema for the past few weeks and says that he did not start taking his as needed Lasix  until this past weekend and only utilized 2 doses thus far. Has been experiencing episodic chills which will last for 15 to 20 minutes and then spontaneously resolve. This has been occurring since his recent surgery and reports this also occurred after CABG/AVR in 10/2023 as well.   Past  Medical History:  Diagnosis Date   Anemia    Anxiety    Arthritis    BPH (benign prostatic hyperplasia)    Status post biopsy in 2009. - w/ LUTS   Coronary artery disease    a. s/p CABG in 10/2023 with LIMA-LAD and SVG-Ramus at the time of AVR   Depression    Essential hypertension    Hyperlipidemia    On Crestor  - followed by PCP   Moderate calcific  aortic stenosis 04/2017   a. s/p AVR with 23 mm Edwards Inspiris Resilia pericardial valve in 10/2023   Stroke (HCC) 2007   No true etiology noted   UTI (urinary tract infection)     Past Surgical History:  Procedure Laterality Date   AORTIC VALVE REPLACEMENT N/A 11/02/2023   Procedure: AORTIC VALVE REPLACEMENT (AVR) USING 23 MM INSPIRIS RESILIA AORTIC VALVE;  Surgeon: Lucas Dorise POUR, MD;  Location: [facility] OR;  Service: Open Heart Surgery;  Laterality: N/A;   CORONARY ARTERY BYPASS GRAFT N/A 11/02/2023   Procedure: CORONARY ARTERY BYPASS GRAFTING (CABG) X TWO USING LEFT INTERNAL MAMMARY ARTERY AND RIGHT GREATER SAPHENEOUS VEIN HARVESTED ENDOSCOPICLY;  Surgeon: Lucas Dorise POUR, MD;  Location: [facility] OR;  Service: Open Heart Surgery;  Laterality: N/A;   CORONARY PRESSURE/FFR STUDY N/A 10/09/2023   Procedure: CORONARY PRESSURE/FFR STUDY;  Surgeon: Wendel Lurena POUR, MD;  Location: [facility] INVASIVE CV LAB;  Service: Cardiovascular;  Laterality: N/A;   IR ANGIOGRAM PELVIS SELECTIVE OR SUPRASELECTIVE  10/27/2023   IR ANGIOGRAM PELVIS SELECTIVE OR SUPRASELECTIVE  10/27/2023   IR ANGIOGRAM SELECTIVE EACH ADDITIONAL VESSEL  10/27/2023   IR ANGIOGRAM SELECTIVE EACH ADDITIONAL VESSEL  10/27/2023   IR ANGIOGRAM SELECTIVE EACH ADDITIONAL VESSEL  10/27/2023   IR ANGIOGRAM SELECTIVE EACH ADDITIONAL VESSEL  10/27/2023   IR EMBO ARTERIAL NOT HEMORR HEMANG INC GUIDE ROADMAPPING  10/27/2023   IR US  GUIDE VASC ACCESS RIGHT  10/27/2023   RIGHT/LEFT HEART CATH AND CORONARY ANGIOGRAPHY N/A 10/09/2023   Procedure: RIGHT/LEFT HEART CATH AND CORONARY ANGIOGRAPHY;  Surgeon: Wendel Lurena POUR, MD;  Location: [facility] INVASIVE CV LAB;  Service: Cardiovascular;  Laterality: N/A;   TEE WITHOUT CARDIOVERSION N/A 11/02/2023   Procedure: TRANSESOPHAGEAL ECHOCARDIOGRAM;  Surgeon: Lucas Dorise POUR, MD;  Location: [facility] OR;  Service: Open Heart Surgery;  Laterality: N/A;   TRANSTHORACIC ECHOCARDIOGRAM  04/28/2017    EF 65-70%. GR 1 DD. No regional wall  motion abnormality. Moderate aortic stenosis with moderate regurgitation. Mean gradient 34 mmHg.   XI ROBOTIC ASSISTED SIMPLE PROSTATECTOMY N/A 05/22/2024   Procedure: PROSTATECTOMY, SIMPLE, ROBOT-ASSISTED;  Surgeon: Alvaro Ricardo KATHEE Mickey., MD;  Location: [facility] ORS;  Service: Urology;  Laterality: N/A;     Home Medications:  Prior to Admission medications   Medication Sig Start Date End Date Taking? Authorizing Provider  amLODipine  (NORVASC ) 5 MG tablet Take 1 tablet (5 mg total) by mouth daily. 10/13/23  Yes Amin, Ankit C, MD  aspirin  81 MG chewable tablet Chew 81 mg by mouth daily. Swallow whole.   Yes [provider]  ciprofloxacin (CIPRO) 500 MG tablet Take 500 mg by mouth 2 (two) times daily.   Yes [provider]  docusate sodium  (COLACE) 100 MG capsule Take 1 capsule (100 mg total) by mouth 2 (two) times daily. Patient taking differently: Take 100 mg by mouth daily as needed for mild constipation. 05/22/24  Yes Dancy, Alan, PA-C  Ferrous Gluconate (IRON  27 PO) Take 27 mg by mouth daily.   Yes [provider]  finasteride  (PROSCAR ) 5 MG tablet Take 5 mg by mouth every evening.   Yes [provider]  furosemide  (LASIX ) 20 MG tablet TAKE 1 TABLET(20 MG) BY MOUTH DAILY AS NEEDED Patient taking differently: Take 20 mg by mouth daily as needed for fluid or edema. 03/01/24  Yes Thukkani, Arun K, MD  nitroGLYCERIN  (NITROSTAT ) 0.4 MG SL tablet Place 1 tablet (0.4 mg total) under the tongue every 5 (five) minutes as needed for chest pain. 10/19/23  Yes Thukkani, Arun K, MD  rosuvastatin  (CRESTOR ) 40 MG tablet Take 40 mg by mouth daily.   Yes [provider]    Scheduled Meds:  aspirin   81 mg Oral Daily   finasteride   5 mg Oral QPM   pantoprazole  (PROTONIX ) IV  40 mg Intravenous Q24H   rosuvastatin   40 mg Oral Daily   Continuous Infusions:  heparin  1,000 Units/hr (06/13/24 0648)   PRN Meds: acetaminophen  **OR** acetaminophen , furosemide , ondansetron   **OR** ondansetron  (ZOFRAN ) IV, polyethylene glycol  Allergies:    Allergies  Allergen Reactions   Tetanus Toxoids Other (See Comments)    Childhood Allergy    Zetia  [Ezetimibe ] Diarrhea    Social History:   Social History   Socioeconomic History   Marital status: Married    Spouse name: Not on file   Number of children: 1   Years of education: Not on file   Highest education level: Not on file  Occupational History   Occupation: Retired    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  Tobacco Use   Smoking status: Every Day    Current packs/day: 0.00    Average packs/day: 1.5 packs/day for 24.0 years (36.0 ttl pk-yrs)    Types: Cigarettes    Last attempt to quit: 08/21/2023    Years since quitting: 0.8   Smokeless tobacco: Never  Vaping Use   Vaping status: Never Used  Substance and Sexual Activity   Alcohol use: Not Currently    Alcohol/week: 6.0 standard drinks of alcohol    Types: 6 Cans of beer per week   Drug use: No   Sexual activity: Not Currently  Other Topics Concern   Not on file  Social History Narrative   Married father of 1. Lives with his wife of 49 years. He is a retired Marine scientist used to work for [employer]   4 cups of coffee a day.    No structured exercise regimen    Family History:    Family History  Problem Relation Age of Onset   Coronary artery disease Mother 63       20. Was diagnosed with a blood clot - suspect that this was a STEMI   Lung cancer Father    Thyroid  disease Sister    Healthy Brother 63   Pancreatic cancer Sister 73   Cancer Maternal Grandmother    Heart disease Maternal Grandfather 26   Cancer Paternal Grandmother    Stroke Paternal Grandfather      ROS:  Please see the history of present illness.   All other ROS reviewed and negative.     Physical Exam/Data: Vitals:   06/12/24 2217 06/12/24 2218 06/12/24 2330 06/13/24 0522  BP:   120/77 129/76  Pulse: (!) 109  (!) 109 (!) 101  Resp: 20  20 17    Temp:  98.6 F (37 C) 99 F (37.2 C) 98.2 F (36.8 C)  TempSrc:  Oral Oral Oral  SpO2: 98%  97% 96%  Weight:   69.5 kg  Height:   5' 2 (1.575 m)     Intake/Output Summary (Last 24 hours) at 06/13/2024 1014 Last data filed at 06/13/2024 0500 Gross per 24 hour  Intake --  Output 650 ml  Net -650 ml      06/12/2024   11:30 PM 06/12/2024    4:57 PM 05/22/2024   11:07 AM  Last 3 Weights  Weight (lbs) 153 lb 3.5 oz 156 lb 8.4 oz 156 lb 8.4 oz  Weight (kg) 69.5 kg 71 kg 71 kg     Body mass index is 28.02 kg/m.  General:  Well nourished, well developed male appearing in no acute distress HEENT: normal Neck: no JVD Vascular: No carotid bruits; Distal pulses 2+ bilaterally Cardiac:  normal S1, S2; RRR; no murmur  Lungs:  clear to auscultation bilaterally, no wheezing, rhonchi or rales  Abd: soft, nontender, no hepatomegaly  Ext: trace lower extremity edema bilaterally.  Musculoskeletal:  No deformities, BUE and BLE strength normal and equal Skin: warm and dry  Neuro:  CNs 2-12 intact, no focal abnormalities noted Psych:  Normal affect   EKG:  The EKG was personally reviewed and demonstrates: Possible ectopic atrial rhythm with short PR interval, heart rate 99 and known RBBB.  Telemetry:  Telemetry was personally reviewed and demonstrates: Appears consistent with an ectopic atrial rhythm with HR in the 90's.   Relevant CV Studies:  R/LHC: 09/2023   Ost LM lesion is 50% stenosed.   1.  Moderate left main stenosis with an RFR of 0.83. 2.  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 3.  Fick cardiac output of 6.5 L/min and Fick cardiac index of 3.8 L/min/m with the following hemodynamics:             Right atrial pressure mean of 7             Right ventricular pressure 42/-2 with an end-diastolic pressure of 8 mmHg             Wedge pressure mean of 14 mmHg with V waves to 23 mmHg             PA pressure of 38/16 with a mean of 24 mmHg             PVR of 1.5 Woods  units             PA pulsatility index of 3.1   Recommendation: Continue evaluation for aortic valve intervention.  Will obtain TAVR protocol CTA tomorrow.  Echocardiogram: 11/2023 IMPRESSIONS     1. Left ventricular ejection fraction, by estimation, is 60 to 65%. The  left ventricle has normal function. The left ventricle has no regional  wall motion abnormalities. There is mild concentric left ventricular  hypertrophy. Left ventricular diastolic  parameters are consistent with Grade I diastolic dysfunction (impaired  relaxation).   2. Right ventricular systolic function is normal. The right ventricular  size is normal. There is normal pulmonary artery systolic pressure. The  estimated right ventricular systolic pressure is 26.4 mmHg.   3. Left atrial size was moderately dilated.   4. Right atrial size was mild to moderately dilated.   5. The mitral valve is degenerative. Mild mitral valve regurgitation.  Moderate mitral annular calcification.   6. The aortic valve has been repaired/replaced. Aortic valve  regurgitation is trivial. There is a 23 mm Inspiris Resilia valve present  in the aortic position. Aortic valve mean gradient measures 12.0 mmHg.  Dimentionless index 0.55.  7. The inferior vena cava is normal in size with greater than 50%  respiratory variability, suggesting right atrial pressure of 3 mmHg.   Comparison(s): Prior images reviewed side by side. Interval AVR noted with  grossly normal function and trivial aortic regurgitation.   Laboratory Data: High Sensitivity Troponin:   Recent Labs  Lab 06/12/24 1819 06/12/24 1928  TROPONINIHS 99* 86*     Chemistry Recent Labs  Lab 06/12/24 1819 06/13/24 0454 06/13/24 0937  NA 134* 135  --   K 3.7 3.1*  --   CL 101 100  --   CO2 23 25  --   GLUCOSE 121* 105*  --   BUN 15 12  --   CREATININE 0.70 0.64  --   CALCIUM  8.7* 8.6*  --   MG  --   --  1.9  GFRNONAA >60 >60  --   ANIONGAP 10 10  --     Recent  Labs  Lab 06/12/24 1819  PROT 6.5  ALBUMIN  3.1*  AST 19  ALT 12  ALKPHOS 64  BILITOT 0.8   Lipids No results for input(s): CHOL, TRIG, HDL, LABVLDL, LDLCALC, CHOLHDL in the last 168 hours.  Hematology Recent Labs  Lab 06/12/24 1819 06/13/24 0454  WBC 14.0* 14.0*  RBC 3.68* 3.65*  HGB 10.8* 10.8*  HCT 32.8* 32.5*  MCV 89.1 89.0  MCH 29.3 29.6  MCHC 32.9 33.2  RDW 13.6 13.6  PLT 176 177   Thyroid   Recent Labs  Lab 06/12/24 1928  TSH 0.937    BNP Recent Labs  Lab 06/12/24 1819  BNP 169.0*    DDimer  Recent Labs  Lab 06/12/24 1819  DDIMER >20.00*    Radiology/Studies:  CT Angio Chest PE W and/or Wo Contrast Result Date: 06/12/2024 CLINICAL DATA:  Positive D-dimer and chest pain, initial encounter EXAM: CT ANGIOGRAPHY CHEST WITH CONTRAST TECHNIQUE: Multidetector CT imaging of the chest was performed using the standard protocol during bolus administration of intravenous contrast. Multiplanar CT image reconstructions and MIPs were obtained to evaluate the vascular anatomy. RADIATION DOSE REDUCTION: This exam was performed according to the departmental dose-optimization program which includes automated exposure control, adjustment of the mA and/or kV according to patient size and/or use of iterative reconstruction technique. CONTRAST:  75mL OMNIPAQUE  IOHEXOL  350 MG/ML SOLN COMPARISON:  Chest x-ray from earlier in the same day. CT from 10/10/2023 FINDINGS: Cardiovascular: Atherosclerotic calcifications of the aorta are noted. No aneurysmal dilatation is seen. 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 Coronary calcifications are noted. The pulmonary artery shows a normal branching pattern bilaterally. No filling defect to suggest pulmonary embolism is noted. Mediastinum/Nodes: Thoracic inlet is within normal limits. No hilar or mediastinal adenopathy is noted. The esophagus as visualized is within normal limits. Lungs/Pleura: Lungs are well aerated  bilaterally. Mild emphysematous changes are seen. Parenchymal nodule is seen. No infiltrate or effusion is noted. Upper Abdomen: Visualized upper abdomen demonstrates stable simple cyst within the right lobe of the liver. Musculoskeletal: Degenerative changes of the thoracic spine are seen. No acute bony abnormality is noted. Review of the MIP images confirms the above findings. IMPRESSION: No evidence of pulmonary embolism. No acute abnormality seen. Aortic Atherosclerosis (ICD10-I70.0) and Emphysema (ICD10-J43.9). Electronically Signed   By: Oneil Devonshire M.D.   On: 06/12/2024 19:24   DG Chest Port 1 View Result Date: 06/12/2024 CLINICAL DATA:  Chest pain EXAM: PORTABLE CHEST 1 VIEW COMPARISON:  05/07/2024 FINDINGS: Prior CABG and valve replacement. Heart and mediastinal  contours are within normal limits. No focal opacities or effusions. No acute bony abnormality. Aortic atherosclerosis. IMPRESSION: No active cardiopulmonary disease. Electronically Signed   By: Franky Crease M.D.   On: 06/12/2024 18:05     Assessment and Plan:  1. Atypical Chest Pain/Elevated Troponin Values - Presents with a 2-day history of chest pain which has lasted for several hours at a time and is not associated with exertion or positional changes. Hs troponins have been flat at 99 and 86. D-dimer elevated at > 20 but CT was negative for PE and lower extremity dopplers are pending. He is at high-risk for a DVT given his recent surgery and decreased activity. - Agree with obtaining an echocardiogram to assess for any structural abnormalities. Additional recommendations based off further workup. Remains on IV Heparin  for now. Continue ASA 81mg  daily and Crestor  40mg  daily.   2. CAD - He underwent CABG in 10/2023 with LIMA-LAD and SVG-Ramus at the time of AVR. He denies any exertional chest pain but has experienced more consistent pain for several hours at a time as discussed above. Echocardiogram pending. - Continue ASA 81 mg  daily and Crestor  40 mg daily.  3. Severe AS - He underwent AVR in 10/2023 with 23 mm Edwards Inspiris Resilia pericardial valve. Echocardiogram in 11/2023 showed his aortic valve was functioning normally with only trivial AI. Repeat echocardiogram pending.  4. Questionable ectopic atrial rhythm - EKG shows a much shorter PR interval as compared to prior EKG tracings and telemetry is concerning for an ectopic atrial rhythm but heart rate is in the 90's. No definitive atrial fibrillation. Not on a beta-blocker prior to admission. - K+ was low at 3.1 this morning and supplementation has already been ordered by the admitting team. Magnesium  pending.  5. SIRS - He did have a leukocytosis on admission and was tachycardic. Blood cultures, lactic acid and procalcitonin pending. MRI Brain also ordered by the admitting team. Further workup per the admitting team.    For questions or updates, please contact [facility] Please consult [email or web address] for contact info under    Signed, Laymon CHRISTELLA Qua, PA-C  06/13/2024 10:14 AM  Attending Note   Patient seen and discussed with PA Qua, I agree with her documentation. 78 yo male history of CAD with CABG LIMA-LAD, SVG-Ramus 10/2023, 23 mm inspiris AVR 10/2023, HTN, HLD, prior CVA, BPH complicated by bladder obstruction s/p prostatectomy 05/23/24, presents with chest pain Reports constant aching chest pain lasting hours at a time, around 10 hours and then spontaneously resolved.    K 3.7 Cr 0.70 BUN 15 WBC 14 Hgb 10.8 Plt 176 Ddimer >20 BNP 169 TSH 0.937 Lactic acid 1.1 Trop 99-->86 EKG SR vs ectopic atrial rhythm, RBBB/LAFB CXR no acute process CT PE no PE    1.CAD/chest pain - CABG LIMA-LAD, SVG-Ramus 10/2023, - chest pain is atypical, lasting around 10 hours constant - fairly low probability acute obstructive disease would have developed since 10/2023 CABG - mild trop 99 trending down - EKG without specific ischemic changes -  echo pending - at this time low suspicion for ACS, no immediate plans for ischemic testing. Reasonable to continue heparin  as we obtain additional informatin.   2. Sinus tach - little variability in heart rates fairly constant 95-100 raises questions about possible atach/ectopic atrial arrhythmia however there is not a marked change in P wave morphology from baseline - follow tele for now.   3. Rigors - ongoing workup per primary team - elevated  WBC, normal lactic acid. Procalc is pending.   Dorn Ross MD

## 2024-06-13 NOTE — Consult Note (Signed)
 PHARMACY - ANTICOAGULATION  Pharmacy Consult for Heparin  Indication: chest pain/ACS Brief A/P: Heparin  level subtherapeutic Increase Heparin  rate  Patient Measurements: Height: 5' 2 (157.5 cm) Weight: 69.5 kg (153 lb 3.5 oz) IBW/kg (Calculated) : 54.6 HEPARIN  DW (KG): 68.6  Labs: Recent Labs    06/12/24 1819 06/12/24 1928 06/13/24 0454  HGB 10.8*  --  10.8*  HCT 32.8*  --  32.5*  PLT 176  --  177  APTT 41*  --   --   LABPROT 15.1  --   --   INR 1.1  --   --   HEPARINUNFRC  --   --  0.14*  CREATININE 0.70  --  0.64  TROPONINIHS 99* 86*  --     Estimated Creatinine Clearance: 68.4 mL/min (by C-G formula based on SCr of 0.64 mg/dL).  Assessment: 78 y.o. male with chest pain for heparin    Goal of Therapy:  Heparin  level 0.3-0.7 units/ml Monitor platelets by anticoagulation protocol: Yes   Plan:  Heparin  2000 units IV bolus, then increase heparin   1000 units/hr Check heparin  level in 6 hours.   Newel Oien, Cordella Misty 06/13/2024,6:39 AM

## 2024-06-13 NOTE — Plan of Care (Signed)
  Problem: Education: Goal: Knowledge of the procedure and recovery process will improve Outcome: Progressing   Problem: Bowel/Gastric: Goal: Gastrointestinal status for postoperative course will improve Outcome: Progressing   Problem: Pain Management: Goal: General experience of comfort will improve Outcome: Progressing

## 2024-06-13 NOTE — Progress Notes (Incomplete)
 Attending Note   Patient seen and discussed with PA Johnson, I agree with her documentation. 78 yo male history of CAD with CABG LIMA-LAD, SVG-Ramus 10/2023, 23 mm inspiris AVR 10/2023, HTN, HLD, prior CVA, BPH complicated by bladder obstruction s/p prostatectomy 05/23/24, presents with chest pain Reports constant aching chest pain lasting hours at a time, around 10 hours and then spontaneously resolved.    K 3.7 Cr 0.70 BUN 15 WBC 14 Hgb 10.8 Plt 176 Ddimer >20 BNP 169 TSH 0.937 Lactic acid 1.1 Trop 99-->86 EKG SR vs ectopic atrial rhythm, RBBB/LAFB CXR no acute process CT PE no PE    1.CAD/chest pain - CABG LIMA-LAD, SVG-Ramus 10/2023, - chest pain is atypical, lasting around 10 hours constant - fairly low probability acute obstructive disease would have developed since 10/2023 CABG - mild trop 99 trending down - EKG without specific ischemic changes - echo pending - at this time low suspicion for ACS, no immediate plans for ischemic testing. Reasonable to continue heparin  as we obtain additional informatin.   2. Sinus tach - little variability in heart rates fairly constant 95-100 raises questions about possible atach/ectopic atrial arrhythmia however there is not a marked change in P wave morphology from baseline - follow tele for now.   3. Rigors - ongoing workup per primary team - elevated WBC, normal lactic acid. Procalc is pending.   Dorn Ross MD

## 2024-06-13 NOTE — Care Management Obs Status (Signed)
 MEDICARE OBSERVATION STATUS NOTIFICATION   Patient Details  Name: Kyle Matthews MRN: [id number] Date of Birth: 1946/06/21   Medicare Observation Status Notification Given:  Yes    Duwaine LITTIE Ada 06/13/2024, 11:02 AM

## 2024-06-13 NOTE — Progress Notes (Signed)
   06/13/24 1144  TOC Brief Assessment  Insurance and Status Reviewed  Patient has primary care physician Yes  Home environment has been reviewed Home  Prior level of function: independent  Prior/Current Home Services No current home services  Social Drivers of Health Review SDOH reviewed no interventions necessary  Readmission risk has been reviewed Yes  Transition of care needs no transition of care needs at this time   Transition of Care Department [facility]) has reviewed patient and no TOC needs have been identified at this time. We will continue to monitor patient advancement through interdisciplinary progression rounds. If new patient transition needs arise, please place a TOC consult.

## 2024-06-13 NOTE — Hospital Course (Addendum)
 Brief Narrative:  78 year old with history of CAD status post CABG 10/2023, AAS status post bioprosthetic AVR, diastolic CHF, CVA, postop A-fib, HTN, HLD, BPH with urinary retention requiring robotic simple prostatectomy 6//25, HTN, HLD presented with chest pain and rigors on 6/25.  Blood cultures positive for MRSE, MRI brain showed concerns of possible embolic stroke.  Echocardiogram negative for endocarditis but TEE showed large mobile density on tricuspid and prosthetic valve.  CT surgery and cardiology team consulted. LHC does not show any new coronary disease, grafts are patent.  CT surgery planning intervention next week   Assessment & Plan:  Principal Problem:   Endocarditis of prosthetic aortic valve Active Problems:   Chronic heart failure with preserved ejection fraction (HFpEF) (HCC)   BPH with obstruction/lower urinary tract symptoms   Essential hypertension   Chest pain   S/P CABG x 2   S/P AVR (aortic valve replacement)   SIRS (systemic inflammatory response syndrome) (HCC)   Bacteremia   Acute embolic stroke (HCC)   Endocarditis of tricuspid valve   Congestive heart failure (HCC)   Prosthetic valve endocarditis   Acute stroke due to ischemia (HCC)   MRSE bacteremia 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  TEE shows concerns of endocarditis.  CT surgery, ID and cardiology team is following.  MRI is also positive for embolic CVA -Initial blood culture 6/27-positive for MRSE -Repeat blood cultures 6/29-NGTD - LHC does not show any new coronary disease, grafts are patent.  CT surgery planning intervention next week  Acute CVA, likely embolic - MRI brain is positive for acute CVA.  Echocardiogram shows EF 50%, moderate TR.  LDL 66, A1c 5.1.  Neurology recommended aspirin .  History of BPH - Status post laparoscopic prostatectomy on 6/4, Foley catheter  was removed on 6/16.  Currently able to urinate.  Continue finasteride   Hyperlipidemia - Statin  Essential hypertension - IV as needed   DVT prophylaxis:     Code Status: Full Code Family Communication:   Status is: Inpatient Ongoing management for endocarditis.    Subjective: No complaints besides facial redness that started a few days ago   Examination:  General exam: Appears calm and comfortable  Respiratory system: Clear to auscultation. Respiratory effort normal. Cardiovascular system: S1 & S2 heard, RRR. No JVD, murmurs, rubs, gallops or clicks. No pedal edema. Gastrointestinal system: Abdomen is nondistended, soft and nontender. No organomegaly or masses felt. Normal bowel sounds heard. Central nervous system: Alert and oriented. No focal neurological deficits. Extremities: Symmetric 5 x 5 power. Skin: No specific facial rash, pruritus Psychiatry: Judgement and insight appear normal. Mood & affect appropriate.

## 2024-06-13 NOTE — Progress Notes (Signed)
 PROGRESS NOTE  Antoinette Haskett FMW:[id number] DOB: [birth year] DOA: 06/12/2024 PCP: Verena Mems, MD  Brief History:  78 year old male with a history of coronary artery disease status post CABG 10/2023, AS s/p bioprosthetic AVR, diastolic dysfunction, stroke, postoperative atrial fibrillation post CABG, HTN, HLD, BPH with urinary retention status post robotic assisted laparoscopic simple prostatectomy 05/22/2024, hypertension, hyperlipidemia, presenting with midsternal chest discomfort subjective chills and rigors. Regarding the chest discomfort, the patient stated that the sensation along his mid sternum evening of 06/11/2024 while watching television.  He went to bed and woke up on the morning of 06/12/2024 with the same burning midsternal chest discomfort.  Around noon time he had an episode of chills and shaking which resulted in an episode of nausea and vomiting.  This was after he ate some cereal.  His chest discomfort continued.  As result, he presented for further evaluation and treatment. He denies any headache, neck pain coughing, hemoptysis, hematemesis, pain, dysuria, hematuria, hematochezia, melena.  Regarding his chills and shaking episodes, the patient states that he has never had a temperature greater than 98.9 Fahrenheit.  He states the episodes usually last about 15- 20 minutes.  He feels cold and wraps himself up in the blanket.  He states that he is able to somewhat suppress his shaking, but is no completely suppressed.  He does not lose consciousness.  He states his bilateral arms> bilateral legs shake.  He is able to speak and communicate during these episodes.  He states that he has had these intermittent episodes since his CABG 10/2023.  However he has had 4 episodes past 2 to 3 weeks.  He denies any headache, visual disturbance, focal extremity weakness coughing,, dysuria, hematuria.  Notably, the patient went to see his outpatient provider on 06/06/2024 he was placed on  ciprofloxacin empirically.  The patient states that he took 6 days.  His last dose was 06/11/24.  The patient elected temperature 99.0 F.  He was initially tachycardic up to 122.  Saturation was 97% room air.  WBC 14.0, hemoglobin 10.8, platelets 177.  Sodium 134, potassium 3.7, bicarbonate 23, serum creatinine 0.70. Cardiology was consulted to assist with management.   In the ED, the patient had a low-grade temperature 1 0 F.  He was hemodynamically stable.  Oxygen saturation WBC 14.0, platelets 176.  BMP showed sodium 134, potassium 3.7, bicarbonate 23, serum creatinine 0.70 LFTs unremarkable.  BNP 169.0.  99>> 86.  EKG showed sinus rhythm with right bundle branch block.  CTA chest was negative for PE.  There is mild emphysematous change.  There is no infiltrates or effusions.   Assessment/Plan: Chest pain/elevated troponin -troponins flat 99>>86 -cardiology consult -echo -continue aspirin  and statin  SIRS/Chills/Rigors/Shaking -Patient presented with leukocytosis and tachycardia -etiology unclear - EEG - follow blood culture - Check lactic acid - Check PCT - The patient has been on ciprofloxacin which may compromise cultures - random cortisol - MR brain - TSH 0.937 - d/c antibiotics and monitor -UA/culture  BPH -s/p laparoscopic prostatectomy 05/22/2024 - Foley catheter was removed 06/03/2024 - Able to urinate without difficulty - continue finasteride   Mixed Hyperlipidemia -continue statin  Elevated D-dimer -CTA chest neg for PE -venous duplex legs  Essential HTN -hold amlodipine  temporarily  Aortic Stenosis -s/p bioprosthetic valve           Family Communication:  no  Family at bedside  Consultants:  cardiology  Code Status:  FULL   DVT Prophylaxis:  IV heparin    Procedures: As Listed in Progress Note Above  Antibiotics: Cipro 6/25>>6/26     Subjective: Patient denies fevers, chills, headache, chest pain, dyspnea, nausea, vomiting,  diarrhea, abdominal pain, dysuria, hematuria, hematochezia, and melena.   Objective: Vitals:   06/12/24 2217 06/12/24 2218 06/12/24 2330 06/13/24 0522  BP:   120/77 129/76  Pulse: (!) 109  (!) 109 (!) 101  Resp: 20  20 17   Temp:  98.6 F (37 C) 99 F (37.2 C) 98.2 F (36.8 C)  TempSrc:  Oral Oral Oral  SpO2: 98%  97% 96%  Weight:   69.5 kg   Height:   5' 2 (1.575 m)     Intake/Output Summary (Last 24 hours) at 06/13/2024 0849 Last data filed at 06/13/2024 0500 Gross per 24 hour  Intake --  Output 650 ml  Net -650 ml   Weight change:  Exam:  General:  Pt is alert, follows commands appropriately, not in acute distress HEENT: No icterus, No thrush, No neck mass, [place]/AT Cardiovascular: RRR, S1/S2, no rubs, no gallops Respiratory: CTA bilaterally, no wheezing, no crackles, no rhonchi Abdomen: Soft/+BS, non tender, non distended, no guarding Extremities: No edema, No lymphangitis, No petechiae, No rashes, no synovitis Neuro:  CN II-XII intact, strength 4/5 in RUE, RLE, strength 4/5 LUE, LLE; sensation intact bilateral; no dysmetria; babinski equivocal    Data Reviewed: I have personally reviewed following labs and imaging studies Basic Metabolic Panel: Recent Labs  Lab 06/12/24 1819 06/13/24 0454  NA 134* 135  K 3.7 3.1*  CL 101 100  CO2 23 25  GLUCOSE 121* 105*  BUN 15 12  CREATININE 0.70 0.64  CALCIUM  8.7* 8.6*   Liver Function Tests: Recent Labs  Lab 06/12/24 1819  AST 19  ALT 12  ALKPHOS 64  BILITOT 0.8  PROT 6.5  ALBUMIN  3.1*   No results for input(s): LIPASE, AMYLASE in the last 168 hours. No results for input(s): AMMONIA in the last 168 hours. Coagulation Profile: Recent Labs  Lab 06/12/24 1819  INR 1.1   CBC: Recent Labs  Lab 06/12/24 1819 06/13/24 0454  WBC 14.0* 14.0*  NEUTROABS 11.8*  --   HGB 10.8* 10.8*  HCT 32.8* 32.5*  MCV 89.1 89.0  PLT 176 177   Cardiac Enzymes: No results for input(s): CKTOTAL, CKMB,  CKMBINDEX, TROPONINI in the last 168 hours. BNP: Invalid input(s): POCBNP CBG: No results for input(s): GLUCAP in the last 168 hours. HbA1C: No results for input(s): HGBA1C in the last 72 hours. Urine analysis:    Component Value Date/Time   COLORURINE YELLOW 11/02/2023 0057   APPEARANCEUR CLEAR 11/02/2023 0057   LABSPEC 1.008 11/02/2023 0057   PHURINE 7.0 11/02/2023 0057   GLUCOSEU NEGATIVE 11/02/2023 0057   HGBUR MODERATE (A) 11/02/2023 0057   BILIRUBINUR NEGATIVE 11/02/2023 0057   KETONESUR NEGATIVE 11/02/2023 0057   PROTEINUR NEGATIVE 11/02/2023 0057   NITRITE NEGATIVE 11/02/2023 0057   LEUKOCYTESUR MODERATE (A) 11/02/2023 0057   Sepsis Labs: @LABRCNTIP (procalcitonin:4,lacticidven:4) )No results found for this or any previous visit (from the past 240 hours).   Scheduled Meds:  aspirin   81 mg Oral Daily   ciprofloxacin  500 mg Oral BID   finasteride   5 mg Oral QPM   pantoprazole  (PROTONIX ) IV  40 mg Intravenous Q24H   rosuvastatin   40 mg Oral Daily   Continuous Infusions:  heparin  1,000 Units/hr (06/13/24 9351)    Procedures/Studies: CT Angio Chest PE W and/or Wo Contrast Result Date: 06/12/2024 CLINICAL  DATA:  Positive D-dimer and chest pain, initial encounter EXAM: CT ANGIOGRAPHY CHEST WITH CONTRAST TECHNIQUE: Multidetector CT imaging of the chest was performed using the standard protocol during bolus administration of intravenous contrast. Multiplanar CT image reconstructions and MIPs were obtained to evaluate the vascular anatomy. RADIATION DOSE REDUCTION: This exam was performed according to the departmental dose-optimization program which includes automated exposure control, adjustment of the mA and/or kV according to patient size and/or use of iterative reconstruction technique. CONTRAST:  75mL OMNIPAQUE  IOHEXOL  350 MG/ML SOLN COMPARISON:  Chest x-ray from earlier in the same day. CT from 10/10/2023 FINDINGS: Cardiovascular: Atherosclerotic calcifications of  the aorta are noted. No aneurysmal dilatation is seen. 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 Coronary calcifications are noted. The pulmonary artery shows a normal branching pattern bilaterally. No filling defect to suggest pulmonary embolism is noted. Mediastinum/Nodes: Thoracic inlet is within normal limits. No hilar or mediastinal adenopathy is noted. The esophagus as visualized is within normal limits. Lungs/Pleura: Lungs are well aerated bilaterally. Mild emphysematous changes are seen. Parenchymal nodule is seen. No infiltrate or effusion is noted. Upper Abdomen: Visualized upper abdomen demonstrates stable simple cyst within the right lobe of the liver. Musculoskeletal: Degenerative changes of the thoracic spine are seen. No acute bony abnormality is noted. Review of the MIP images confirms the above findings. IMPRESSION: No evidence of pulmonary embolism. No acute abnormality seen. Aortic Atherosclerosis (ICD10-I70.0) and Emphysema (ICD10-J43.9). Electronically Signed   By: Oneil Devonshire M.D.   On: 06/12/2024 19:24   DG Chest Port 1 View Result Date: 06/12/2024 CLINICAL DATA:  Chest pain EXAM: PORTABLE CHEST 1 VIEW COMPARISON:  05/07/2024 FINDINGS: Prior CABG and valve replacement. Heart and mediastinal contours are within normal limits. No focal opacities or effusions. No acute bony abnormality. Aortic atherosclerosis. IMPRESSION: No active cardiopulmonary disease. Electronically Signed   By: Franky Crease M.D.   On: 06/12/2024 18:05    Alm Schneider, DO  [facility]  If 7PM-7AM, please contact night-coverage [email or web address] Password [facility] 06/13/2024, 8:49 AM   LOS: 0 days

## 2024-06-13 NOTE — Progress Notes (Signed)
 Responded to nursing call:  hematuria   Subjective: Pt urinated blood with clots.   Since simple prostatectomy on 05/23/23 pt has had small amounts of blood tinged urine. Pt able to urinate spontaneously currently without difficulty.   Vitals:   06/12/24 2218 06/12/24 2330 06/13/24 0522 06/13/24 1221  BP:  120/77 129/76 127/71  Pulse:  (!) 109 (!) 101 88  Resp:  20 17   Temp: 98.6 F (37 C) 99 F (37.2 C) 98.2 F (36.8 C) 98.1 F (36.7 C)  TempSrc: Oral Oral Oral Oral  SpO2:  97% 96% 98%  Weight:  69.5 kg    Height:  5' 2 (1.575 m)     CV--RRR Lung--CTA Abd--soft+BS/NT   Assessment/Plan: Hematuria -discussed with urology, Dr. Watt -expects bleeding to stop if related to IV heparin  -IV heparin  stopped after discussing with Dr. Alvan -monitor for now as long as pt able to urinate     Alm Schneider, DO [facility]

## 2024-06-13 NOTE — Progress Notes (Signed)
 Evening rounds  Pt still urinating blood clots.  States after initial hesitation, he is able to urinate without difficulty.  Feels like he is emptying his bladder ok.  No suprapubic pain.  Feels blood in urine is less than earlier.  Reviewed MR brain with patient and spouse at bedside-- --Small acute infarct in the left occipital lobe  --MRA brain--negative LVO --obtain CTA neck --given this was completely incidental finding and pt is asymtpomatic and current hematuria, remain on ASA only for now. --consult neurology in am for further recommendations --check A1C, Lipid panel --Echo already done--awaiting results --PT/OT eval --reconsult urology in am if hematuria no better or if incomplete bladder emptying  DTat

## 2024-06-13 NOTE — Progress Notes (Signed)
Routine EEG complete. Results pending.

## 2024-06-14 DIAGNOSIS — I639 Cerebral infarction, unspecified: Secondary | ICD-10-CM | POA: Diagnosis not present

## 2024-06-14 DIAGNOSIS — I739 Peripheral vascular disease, unspecified: Secondary | ICD-10-CM

## 2024-06-14 DIAGNOSIS — R29700 NIHSS score 0: Secondary | ICD-10-CM | POA: Diagnosis not present

## 2024-06-14 DIAGNOSIS — R569 Unspecified convulsions: Secondary | ICD-10-CM | POA: Diagnosis not present

## 2024-06-14 DIAGNOSIS — R079 Chest pain, unspecified: Secondary | ICD-10-CM | POA: Diagnosis not present

## 2024-06-14 DIAGNOSIS — I350 Nonrheumatic aortic (valve) stenosis: Secondary | ICD-10-CM | POA: Diagnosis not present

## 2024-06-14 DIAGNOSIS — I2510 Atherosclerotic heart disease of native coronary artery without angina pectoris: Secondary | ICD-10-CM | POA: Diagnosis not present

## 2024-06-14 DIAGNOSIS — R6510 Systemic inflammatory response syndrome (SIRS) of non-infectious origin without acute organ dysfunction: Secondary | ICD-10-CM | POA: Diagnosis not present

## 2024-06-14 DIAGNOSIS — R6883 Chills (without fever): Secondary | ICD-10-CM | POA: Diagnosis not present

## 2024-06-14 DIAGNOSIS — T501X5A Adverse effect of loop [high-ceiling] diuretics, initial encounter: Secondary | ICD-10-CM | POA: Diagnosis not present

## 2024-06-14 DIAGNOSIS — R000 Tachycardia, unspecified: Secondary | ICD-10-CM | POA: Diagnosis not present

## 2024-06-14 DIAGNOSIS — R7881 Bacteremia: Secondary | ICD-10-CM

## 2024-06-14 DIAGNOSIS — I5032 Chronic diastolic (congestive) heart failure: Secondary | ICD-10-CM | POA: Diagnosis not present

## 2024-06-14 LAB — BLOOD CULTURE ID PANEL (REFLEXED) - BCID2

## 2024-06-14 LAB — LIPID PANEL
Cholesterol: 124 mg/dL (ref 0–200)
HDL: 40 mg/dL — ABNORMAL LOW (ref >40)
LDL Cholesterol: 66 mg/dL (ref 0–99)
Total CHOL/HDL Ratio: 3.1 ratio
Triglycerides: 91 mg/dL (ref <150)
VLDL: 18 mg/dL (ref 0–40)

## 2024-06-14 LAB — ECHOCARDIOGRAM COMPLETE
AR max vel: 0.84 cm2
AV Area VTI: 0.93 cm2
AV Area mean vel: 0.81 cm2
AV Mean grad: 23.5 mmHg
AV Peak grad: 37.6 mmHg
Ao pk vel: 3.07 m/s
Area-P 1/2: 8.25 cm2
Calc EF: 51.7 %
Height: 62 in
S' Lateral: 2.9 cm
Single Plane A2C EF: 49.9 %
Single Plane A4C EF: 54.7 %
Weight: 2451.52 [oz_av]

## 2024-06-14 LAB — URINE CULTURE: Culture: <10000 — AB

## 2024-06-14 LAB — CBC
HCT: 31 % — ABNORMAL LOW (ref 39.0–52.0)
Hemoglobin: 10.5 g/dL — ABNORMAL LOW (ref 13.0–17.0)
MCH: 30.8 pg (ref 26.0–34.0)
MCHC: 33.9 g/dL (ref 30.0–36.0)
MCV: 90.9 fL (ref 80.0–100.0)
Platelets: 207 10*3/uL (ref 150–400)
RBC: 3.41 MIL/uL — ABNORMAL LOW (ref 4.22–5.81)
RDW: 13.8 % (ref 11.5–15.5)
WBC: 14.2 10*3/uL — ABNORMAL HIGH (ref 4.0–10.5)
nRBC: 0 % (ref 0.0–0.2)

## 2024-06-14 LAB — BASIC METABOLIC PANEL WITH GFR
Anion gap: 9 (ref 5–15)
BUN: 11 mg/dL (ref 8–23)
CO2: 24 mmol/L (ref 22–32)
Calcium: 8.4 mg/dL — ABNORMAL LOW (ref 8.9–10.3)
Chloride: 99 mmol/L (ref 98–111)
Creatinine, Ser: 0.75 mg/dL (ref 0.61–1.24)
GFR, Estimated: >60 mL/min (ref >60)
Glucose, Bld: 114 mg/dL — ABNORMAL HIGH (ref 70–99)
Potassium: 3.8 mmol/L (ref 3.5–5.1)
Sodium: 132 mmol/L — ABNORMAL LOW (ref 135–145)

## 2024-06-14 LAB — HEMOGLOBIN A1C
Hgb A1c MFr Bld: 5.1 % (ref 4.8–5.6)
Mean Plasma Glucose: 99.67 mg/dL

## 2024-06-14 LAB — MAGNESIUM: Magnesium: 1.9 mg/dL (ref 1.7–2.4)

## 2024-06-14 MED ORDER — VANCOMYCIN HCL 1500 MG/300ML IV SOLN
1500.0000 mg | Freq: Once | INTRAVENOUS | Status: AC
  Administered 2024-06-14: 1500 mg via INTRAVENOUS
  Filled 2024-06-14: qty 300

## 2024-06-14 MED ORDER — VANCOMYCIN HCL 1250 MG/250ML IV SOLN
1250.0000 mg | INTRAVENOUS | Status: DC
  Administered 2024-06-15 – 2024-06-17 (×3): 1250 mg via INTRAVENOUS
  Filled 2024-06-14 (×3): qty 250

## 2024-06-14 NOTE — Evaluation (Signed)
 Clinical/Bedside Swallow Evaluation Patient Details  Name: Kyle Matthews MRN: [id number] Date of Birth: [birth year]  Today's Date: 06/14/2024 Time: SLP Start Time (ACUTE ONLY): 1215 SLP Stop Time (ACUTE ONLY): 1232 SLP Time Calculation (min) (ACUTE ONLY): 17 min  Past Medical History:  Past Medical History:  Diagnosis Date   Anemia    Anxiety    Arthritis    BPH (benign prostatic hyperplasia)    Status post biopsy in 2009. - w/ LUTS   Coronary artery disease    a. s/p CABG in 10/2023 with LIMA-LAD and SVG-Ramus at the time of AVR   Depression    Essential hypertension    Hyperlipidemia    On Crestor  - followed by PCP   Moderate calcific aortic stenosis 04/2017   a. s/p AVR with 23 mm Edwards Inspiris Resilia pericardial valve in 10/2023   Stroke (HCC) 2007   No true etiology noted   UTI (urinary tract infection)    Past Surgical History:  Past Surgical History:  Procedure Laterality Date   AORTIC VALVE REPLACEMENT N/A 11/02/2023   Procedure: AORTIC VALVE REPLACEMENT (AVR) USING 23 MM INSPIRIS RESILIA AORTIC VALVE;  Surgeon: Lucas Dorise POUR, MD;  Location: [facility] OR;  Service: Open Heart Surgery;  Laterality: N/A;   CORONARY ARTERY BYPASS GRAFT N/A 11/02/2023   Procedure: CORONARY ARTERY BYPASS GRAFTING (CABG) X TWO USING LEFT INTERNAL MAMMARY ARTERY AND RIGHT GREATER SAPHENEOUS VEIN HARVESTED ENDOSCOPICLY;  Surgeon: Lucas Dorise POUR, MD;  Location: [facility] OR;  Service: Open Heart Surgery;  Laterality: N/A;   CORONARY PRESSURE/FFR STUDY N/A 10/09/2023   Procedure: CORONARY PRESSURE/FFR STUDY;  Surgeon: Wendel Lurena POUR, MD;  Location: [facility] INVASIVE CV LAB;  Service: Cardiovascular;  Laterality: N/A;   IR ANGIOGRAM PELVIS SELECTIVE OR SUPRASELECTIVE  10/27/2023   IR ANGIOGRAM PELVIS SELECTIVE OR SUPRASELECTIVE  10/27/2023   IR ANGIOGRAM SELECTIVE EACH ADDITIONAL VESSEL  10/27/2023   IR ANGIOGRAM SELECTIVE EACH ADDITIONAL VESSEL  10/27/2023   IR ANGIOGRAM SELECTIVE EACH ADDITIONAL VESSEL  10/27/2023    IR ANGIOGRAM SELECTIVE EACH ADDITIONAL VESSEL  10/27/2023   IR EMBO ARTERIAL NOT HEMORR HEMANG INC GUIDE ROADMAPPING  10/27/2023   IR US  GUIDE VASC ACCESS RIGHT  10/27/2023   RIGHT/LEFT HEART CATH AND CORONARY ANGIOGRAPHY N/A 10/09/2023   Procedure: RIGHT/LEFT HEART CATH AND CORONARY ANGIOGRAPHY;  Surgeon: Wendel Lurena POUR, MD;  Location: [facility] INVASIVE CV LAB;  Service: Cardiovascular;  Laterality: N/A;   TEE WITHOUT CARDIOVERSION N/A 11/02/2023   Procedure: TRANSESOPHAGEAL ECHOCARDIOGRAM;  Surgeon: Lucas Dorise POUR, MD;  [facility];  Service: Open Heart Surgery;  Laterality: N/A;   TRANSTHORACIC ECHOCARDIOGRAM  04/28/2017    EF 65-70%. GR 1 DD. No regional wall motion abnormality. Moderate aortic stenosis with moderate regurgitation. Mean gradient 34 mmHg.   XI ROBOTIC ASSISTED SIMPLE PROSTATECTOMY N/A 05/22/2024   Procedure: PROSTATECTOMY, SIMPLE, ROBOT-ASSISTED;  Surgeon: Alvaro Ricardo KATHEE Mickey., MD;  Location: [facility] ORS;  Service: Urology;  Laterality: N/A;   HPI:  Kyle Matthews is a 78 y.o. 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  Patient was brought to the ED via EMS on 6/25 with reports of chest pain.  That morning at about 5 AM, midsternal-where his scar is, burning in sensation, nonradiating.  He reports chest pain is similar to his chest pain prior to his CABG, but this is not as intense.  He took nitro but this did not help.  Reports bilateral lower extremity swelling  over the past few days.  He has had intermittent episodes almost twice daily of shaking chills.  He reports the first episode was just after his cardiac surgery, but over the past 2 weeks he has had them frequently.  Episodes typically last 15 to 20 minutes.  This is second 1 today. He had prostate surgery about 3 weeks ago. He recently had a chronic Foley but this has been removed.  He was started on a course of antibiotics by his outpatient provider  about a week ago, for occult infection.  He has 4 more doses left.  At home he just piles on some blankets, and this helps.  ST consulted for swallow evaluation.    Assessment / Plan / Recommendation  Clinical Impression  Pt seen for clinical swallow evaluation with normal oropharyngeal swallow observed with various consistencies including thin via cup/straw, puree and solids with adequate mastication, oral preparation, timely swallow and adequate oral/pharyngeal clearance.  Discussed potential for esophageal dysphagia given symptoms with pt appreciative, but denying GERD symptoms consistently.  Discussed esophageal precautions to follow during meals prn if symptoms return/persist.  No overt s/s of aspiration present throughout assessment.  Recommend continue Regular consistency (heart healthy)/thin liquid diet with general swallow precautions in place and esophageal precautions prn.  Thank you for this consult.  ST will s/o in acute setting. SLP Visit Diagnosis: Dysphagia, unspecified (R13.10)    Aspiration Risk  No limitations    Diet Recommendation   Thin;Age appropriate regular  Medication Administration: Whole meds with liquid    Other  Recommendations Oral Care Recommendations: Oral care BID;Patient independent with oral care     Assistance Recommended at Discharge  N/a  Functional Status Assessment Patient has not had a recent decline in their functional status  Frequency and Duration  (evaluation only)          Prognosis Prognosis for improved oropharyngeal function: Good      Swallow Study   General Date of Onset: 06/12/24 HPI: Kyle Matthews is a 78 y.o. 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  Patient was brought to the ED via EMS reports of chest pain.  This morning at about 5 AM, midsternal-where his scar is, burning in sensation, nonradiating.  He reports chest pain is similar to his chest pain  prior to his CABG, but this is not as intense.  He took nitro but this did not help.  Reports bilateral lower extremity swelling over the past few days.  He has had intermittent episodes almost twice daily of shaking chills.  He reports the first episode was just after his cardiac surgery, but over the past 2 weeks he has had them frequently.  Episodes typically last 15 to 20 minutes.  This is second 1 today. He had prostate surgery about 3 weeks ago. He recently had a chronic Foley but this has been removed.  He was started on a course of antibiotics by his outpatient provider about a week ago, for occult infection.  He has 4 more doses left.  At home he just piles on some blankets, and this helps.  ST consulted for swallow evaluation. Type of Study: Bedside Swallow Evaluation Previous Swallow Assessment: n/a Diet Prior to this Study: Regular;Thin liquids (Level 0) (heart healthy) Temperature Spikes Noted: No Respiratory Status: Room air History of Recent Intubation: No Behavior/Cognition: Alert;Cooperative;Pleasant mood Oral Cavity Assessment: Within Functional Limits Oral Care Completed by SLP: No (pt eating lunch)  Oral Cavity - Dentition: Adequate natural dentition Vision: Functional for self-feeding Self-Feeding Abilities: Able to feed self Patient Positioning: Upright in bed Baseline Vocal Quality: Normal Volitional Cough: Strong Volitional Swallow: Able to elicit    Oral/Motor/Sensory Function Overall Oral Motor/Sensory Function: Within functional limits   Ice Chips Ice chips: Not tested   Thin Liquid Thin Liquid: Within functional limits Presentation: Cup;Straw;Self Fed    Nectar Thick Nectar Thick Liquid: Not tested   Honey Thick Honey Thick Liquid: Not tested   Puree Puree: Within functional limits Presentation: Self Fed   Solid     Solid: Within functional limits Presentation: Self Fed      Kyle Matthews,M.S.,CCC-SLP 06/14/2024,1:12 PM

## 2024-06-14 NOTE — Progress Notes (Signed)
 Pharmacy Antibiotic Note  Kyle Matthews is a 78 y.o. male admitted on 06/12/2024 with bacteremia.  Pharmacy has been consulted for vancomycin  dosing.  Plan: Vancomycin  1500 mg IV x 1 dose. Vancomycin  1250 mg IV every 24 hours. Monitor labs, c/s, and vanco levels as indicated.  Height: 5' 2 (157.5 cm) Weight: 69.5 kg (153 lb 3.5 oz) IBW/kg (Calculated) : 54.6  Temp (24hrs), Avg:98.5 F (36.9 C), Min:98.1 F (36.7 C), Max:99.2 F (37.3 C)  Recent Labs  Lab 06/12/24 1819 06/13/24 0454 06/13/24 0937 06/14/24 0426  WBC 14.0* 14.0*  --  14.2*  CREATININE 0.70 0.64  --  0.75  LATICACIDVEN  --   --  1.1  --     Estimated Creatinine Clearance: 68.4 mL/min (by C-G formula based on SCr of 0.75 mg/dL).    Allergies  Allergen Reactions   Tetanus Toxoids Other (See Comments)    Childhood Allergy    Zetia  [Ezetimibe ] Diarrhea    Antimicrobials this admission: Vanco 6/27 >>  Microbiology results: 6/27 BCx: pending 6/25 Bcx: gram + cocci 2/2 sets 6/25 BCID: staph species/staph epi 6/26 Ucx:  pending  Thank you for allowing pharmacy to be a part of this patient's care.  Elspeth Sour, PharmD Clinical Pharmacist 06/14/2024 10:33 AM

## 2024-06-14 NOTE — Plan of Care (Signed)
  Problem: Urinary Elimination: Goal: Ability to avoid or minimize complications of infection will improve Outcome: Progressing Goal: Home care management will improve Outcome: Progressing

## 2024-06-14 NOTE — Evaluation (Signed)
 Occupational Therapy Evaluation Patient Details Name: Kyle Matthews MRN: [id number] DOB: 1946/11/10 Today's Date: 06/14/2024   History of Present Illness   Thomes Burak is a 78 y.o. 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  Patient was brought to the ED via EMS reports of chest pain.  This morning at about 5 AM, midsternal-where his scar is, burning in sensation, nonradiating.  He reports chest pain is similar to his chest pain prior to his CABG, but this is not as intense.  He took nitro but this did not help.  Reports bilateral lower extremity swelling over the past few days.  He has had intermittent episodes almost twice daily of shaking chills.  He reports the first episode was just after his cardiac surgery, but over the past 2 weeks he has had them frequently.  Episodes typically last 15 to 20 minutes.  This is second 1 today. He had prostate surgery about 3 weeks ago. He recently had a chronic Foley but this has been removed.  He was started on a course of antibiotics by his outpatient provider about a week ago, for occult infection.  He has 4 more doses left.  At home he just piles on some blankets, and this helps. (per MD)     Clinical Impressions Pt agreeable to OT and PT co-evaluation. Pt appears to be at or near functional baseline. Independent today for ADL's and functional ambulation in the hall. WFL B UE strength. Pt is not recommended for further acute OT services and will be discharged to care of nursing staff for remaining length of stay.               Functional Status Assessment   Patient has not had a recent decline in their functional status     Equipment Recommendations   None recommended by OT             Precautions/Restrictions   Precautions Precautions: None Restrictions Weight Bearing Restrictions Per Provider Order: No     Mobility Bed Mobility Overal bed mobility:  Independent                  Transfers Overall transfer level: Independent                        Balance Overall balance assessment: Independent                                         ADL either performed or assessed with clinical judgement   ADL Overall ADL's : Independent                                             Vision   Vision Assessment?: No apparent visual deficits     Perception Perception: Not tested       Praxis Praxis: Not tested       Pertinent Vitals/Pain Pain Assessment Pain Assessment: No/denies pain     Extremity/Trunk Assessment Upper Extremity Assessment Upper Extremity Assessment: Overall WFL for tasks assessed   Lower Extremity Assessment Lower Extremity Assessment: Defer to PT evaluation   Cervical / Trunk Assessment Cervical / Trunk Assessment: Normal   Communication Communication Communication: No apparent  difficulties   Cognition Arousal: Alert Behavior During Therapy: WFL for tasks assessed/performed Cognition: No apparent impairments                               Following commands: Intact       Cueing  General Comments   Cueing Techniques: Verbal cues                 Home Living Family/patient expects to be discharged to:: Private residence Living Arrangements: Spouse/significant other Available Help at Discharge: Family;Available 24 hours/day Type of Home: House Home Access: Stairs to enter [employer] of Steps: 1+2+1 step up from drive way to walk way then 2 steps up from drive way to landing Entrance Stairs-Rails: Right;Left Home Layout: Able to live on main level with bedroom/bathroom;Two level     Bathroom Shower/Tub: Chief Strategy Officer: Standard     Home Equipment: Agricultural consultant (2 wheels);Cane - single point          Prior Functioning/Environment Prior Level of Function : Independent/Modified  Independent;Driving             Mobility Comments: Independent ADLs Comments: Independent per the pt.                            Co-evaluation PT/OT/SLP Co-Evaluation/Treatment: Yes Reason for Co-Treatment: To address functional/ADL transfers   OT goals addressed during session: ADL's and self-care      AM-PAC OT 6 Clicks Daily Activity     Outcome Measure Help from another person eating meals?: None Help from another person taking care of personal grooming?: None Help from another person toileting, which includes using toliet, bedpan, or urinal?: None Help from another person bathing (including washing, rinsing, drying)?: None Help from another person to put on and taking off regular upper body clothing?: None Help from another person to put on and taking off regular lower body clothing?: None 6 Click Score: 24   End of Session    Activity Tolerance: Patient tolerated treatment well Patient left: Other (comment) (standing in the room)  OT Visit Diagnosis: Muscle weakness (generalized) (M62.81)                Time: 9097-9087 OT Time Calculation (min): 10 min Charges:  OT General Charges $OT Visit: 1 Visit OT Evaluation $OT Eval Low Complexity: 1 Low  Hilmer Aliberti OT, MOT  Jayson Person 06/14/2024, 10:24 AM

## 2024-06-14 NOTE — Evaluation (Signed)
 Physical Therapy Evaluation Patient Details Name: Kyle Matthews MRN: [id number] DOB: 07-Sep-1946 Today's Date: 06/14/2024  History of Present Illness  Kyle Matthews is a 78 y.o. 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  Patient was brought to the ED via EMS reports of chest pain.  This morning at about 5 AM, midsternal-where his scar is, burning in sensation, nonradiating.  He reports chest pain is similar to his chest pain prior to his CABG, but this is not as intense.  He took nitro but this did not help.  Reports bilateral lower extremity swelling over the past few days.  He has had intermittent episodes almost twice daily of shaking chills.  He reports the first episode was just after his cardiac surgery, but over the past 2 weeks he has had them frequently.  Episodes typically last 15 to 20 minutes.  This is second 1 today. He had prostate surgery about 3 weeks ago. He recently had a chronic Foley but this has been removed.  He was started on a course of antibiotics by his outpatient provider about a week ago, for occult infection.  He has 4 more doses left.  At home he just piles on some blankets, and this helps.   Clinical Impression  Patient functioning at baseline for functional mobility and gait demonstrating good return for ambulating in room, hallway without loss of balance or need for an AD. Plan:  Patient discharged from physical therapy to care of nursing for ambulation daily as tolerated for length of stay.          If plan is discharge home, recommend the following: Other (comment) (Patient at baseline)   Can travel by private vehicle        Equipment Recommendations None recommended by PT  Recommendations for Other Services       Functional Status Assessment Patient has not had a recent decline in their functional status     Precautions / Restrictions Precautions Precautions:  None Restrictions Weight Bearing Restrictions Per Provider Order: No      Mobility  Bed Mobility Overal bed mobility: Independent                  Transfers Overall transfer level: Independent                      Ambulation/Gait Ambulation/Gait assistance: Independent Gait Distance (Feet): 200 Feet Assistive device: None Gait Pattern/deviations: WFL(Within Functional Limits) Gait velocity: normal     General Gait Details: good return for ambulating in room, hallways without loss of balance or need for an AD  Stairs            Wheelchair Mobility     Tilt Bed    Modified Rankin (Stroke Patients Only)       Balance Overall balance assessment: Independent                                           Pertinent Vitals/Pain Pain Assessment Pain Assessment: No/denies pain    Home Living Family/patient expects to be discharged to:: Private residence Living Arrangements: Spouse/significant other Available Help at Discharge: Family;Available 24 hours/day Type of Home: House Home Access: Stairs to enter Entrance Stairs-Rails: Right;Left Entrance Stairs-Number of Steps: 1+2+1 step up from drive way to walk way then 2 steps up from  drive way to landing   Home Layout: Able to live on main level with bedroom/bathroom;Two level Home Equipment: Agricultural consultant (2 wheels);Cane - single point      Prior Function Prior Level of Function : Independent/Modified Independent;Driving             Mobility Comments: Independent ADLs Comments: Independent per the pt.     Extremity/Trunk Assessment   Upper Extremity Assessment Upper Extremity Assessment: Defer to OT evaluation    Lower Extremity Assessment Lower Extremity Assessment: Overall WFL for tasks assessed    Cervical / Trunk Assessment Cervical / Trunk Assessment: Normal  Communication   Communication Communication: No apparent difficulties    Cognition Arousal:  Alert Behavior During Therapy: WFL for tasks assessed/performed   PT - Cognitive impairments: No apparent impairments                         Following commands: Intact       Cueing Cueing Techniques: Verbal cues     General Comments      Exercises     Assessment/Plan    PT Assessment Patient does not need any further PT services  PT Problem List         PT Treatment Interventions      PT Goals (Current goals can be found in the Care Plan section)  Acute Rehab PT Goals Patient Stated Goal: return home PT Goal Formulation: With patient Time For Goal Achievement: 06/14/24 Potential to Achieve Goals: Good    Frequency       Co-evaluation PT/OT/SLP Co-Evaluation/Treatment: Yes Reason for Co-Treatment: To address functional/ADL transfers PT goals addressed during session: Mobility/safety with mobility;Balance OT goals addressed during session: ADL's and self-care       AM-PAC PT 6 Clicks Mobility  Outcome Measure Help needed turning from your back to your side while in a flat bed without using bedrails?: None Help needed moving from lying on your back to sitting on the side of a flat bed without using bedrails?: None Help needed moving to and from a bed to a chair (including a wheelchair)?: None Help needed standing up from a chair using your arms (e.g., wheelchair or bedside chair)?: None Help needed to walk in hospital room?: None Help needed climbing 3-5 steps with a railing? : None 6 Click Score: 24    End of Session   Activity Tolerance: Patient tolerated treatment well Patient left: in bed;with call bell/phone within reach Nurse Communication: Mobility status PT Visit Diagnosis: Unsteadiness on feet (R26.81);Other abnormalities of gait and mobility (R26.89);Muscle weakness (generalized) (M62.81)    Time: 9151-9090 PT Time Calculation (min) (ACUTE ONLY): 21 min   Charges:   PT Evaluation $PT Eval Moderate Complexity: 1 Mod PT  Treatments $Therapeutic Activity: 8-22 mins PT General Charges $$ ACUTE PT VISIT: 1 Visit         11:59 AM, 06/14/24 Lynwood Music, MPT Physical Therapist with [facility] [phone] office [phone] mobile phone

## 2024-06-14 NOTE — Progress Notes (Signed)
 PROGRESS NOTE  Kyle Matthews FMW:[id number] DOB: [birth year] DOA: 06/12/2024 PCP: Verena Mems, MD  Brief History:  78 year old male with a history of coronary artery disease status post CABG 10/2023, AS s/p bioprosthetic AVR, diastolic dysfunction, stroke, postoperative atrial fibrillation post CABG, HTN, HLD, BPH with urinary retention status post robotic assisted laparoscopic simple prostatectomy 05/22/2024, hypertension, hyperlipidemia, presenting with midsternal chest discomfort subjective chills and rigors. Regarding the chest discomfort, the patient stated that the sensation along his mid sternum evening of 06/11/2024 while watching television.  He went to bed and woke up on the morning of 06/12/2024 with the same burning midsternal chest discomfort.  Around noon time he had an episode of chills and shaking which resulted in an episode of nausea and vomiting.  This was after he ate some cereal.  His chest discomfort continued.  As result, he presented for further evaluation and treatment. He denies any headache, neck pain coughing, hemoptysis, hematemesis, pain, dysuria, hematuria, hematochezia, melena.  Regarding his chills and shaking episodes, the patient states that he has never had a temperature greater than 98.9 Fahrenheit.  He states the episodes usually last about 15- 20 minutes.  He feels cold and wraps himself up in the blanket.  He states that he is able to somewhat suppress his shaking, but is no completely suppressed.  He does not lose consciousness.  He states his bilateral arms> bilateral legs shake.  He is able to speak and communicate during these episodes.  He states that he has had these intermittent episodes since his CABG 10/2023.  However he has had 4 episodes past 2 to 3 weeks.  He denies any headache, visual disturbance, focal extremity weakness coughing,, dysuria, hematuria.  Notably, the patient went to see his outpatient provider on 06/06/2024 he was placed on  ciprofloxacin  empirically.  The patient states that he took 6 days.  His last dose was 06/11/24.  The patient elected temperature 99.0 F.  He was initially tachycardic up to 122.  Saturation was 97% room air.  WBC 14.0, hemoglobin 10.8, platelets 177.  Sodium 134, potassium 3.7, bicarbonate 23, serum creatinine 0.70. Cardiology was consulted to assist with management.   In the ED, the patient had a low-grade temperature 1 0 F.  He was hemodynamically stable.  Oxygen saturation WBC 14.0, platelets 176.  BMP showed sodium 134, potassium 3.7, bicarbonate 23, serum creatinine 0.70 LFTs unremarkable.  BNP 169.0.  99>> 86.  EKG showed sinus rhythm with right bundle branch block.  CTA chest was negative for PE.  There is mild emphysematous change.  There is no infiltrates or effusions.   Assessment/Plan: Chest pain/elevated troponin -troponins flat 99>>86 -cardiology consult appreciated -echo-EF 55-60%, normal RVF, mod-severe TR, mild MR--mean gradient is increased (12 to 28 mm Hg) and  DVI is decreased (0.55 to 0.41) Cannot exclude obstruction to prosthesis.  -continue aspirin  and statin   SIRS/Chills/Rigors/Shaking -Patient presented with leukocytosis and tachycardia -etiology unclear - EEG--no seizure - follow blood culture--CoNS 2/2 sets - Check lactic acid 1.1 - Check PCT <0.10 - The patient has been on ciprofloxacin  which may compromise cultures - random cortisol--12.4 - MR brain--Small acute infarct in the left occipital lobe  - TSH 0.937 - d/c antibiotics and monitor -urine culture < 10 K organisms  Bacteremia -CoNS x 2/2 sets -repeat blood culture 6/27 -start empiric vanc -wait for final speciation  Acute Ischemic Stroke -Appreciate Neurology Consult -PT/OT evaluation--no PT follow up -Speech therapy  eval -MRI brain--Small acute infarct in the left occipital lobe  -MRA brain--No emergent large vessel occlusion or proximal hemodynamically significant stenosis -CTA  neck--no LVO; patent carotids and vertebral arteries -Echo--EF 55-60%, normal RVF, mod-severe TR, mild MR--mean gradient is increased (12 to 28 mm Hg) and  DVI is decreased (0.55 to 0.41) Cannot exclude obstruction to prosthesis.  -LDL--66 -HbA1C--5.1 -Antiplatelet--Aspirin  81 mg daily  Hematuria -started after IV heparin  -stopped IV heparin  -discussed with urology, Dr. Watt -expects bleeding to stop if related to IV heparin  -IV heparin  stopped after discussing with Dr. Alvan -monitor for now as long as pt able to urinate   BPH -s/p laparoscopic prostatectomy 05/22/2024 - Foley catheter was removed 06/03/2024 - Able to urinate without difficulty - continue finasteride    Mixed Hyperlipidemia -continue statin   Elevated D-dimer -CTA chest neg for PE -venous duplex legs   Essential HTN -hold amlodipine  temporarily   Aortic Stenosis -s/p bioprosthetic valve -discussed with Dr. Jessie echo -plan for TEE 6/30 if able            Family Communication:   spouse 6/27  Consultants:  cardiology  Code Status:  FULL   DVT Prophylaxis:  SCDs   Procedures: As Listed in Progress Note Above  Antibiotics: Vanc 6/27>>      Subjective: Pt states urine is less bloody, more clear.  Able to urinate and empty bladder.  Patient denies fevers, chills, headache, chest pain, dyspnea, nausea, vomiting, diarrhea, abdominal pain, dysuria, hematuria, hematochezia, and melena.   Objective: Vitals:   06/13/24 1221 06/13/24 2026 06/14/24 0615 06/14/24 1209  BP: 127/71 122/69 123/76 112/85  Pulse: 88 86 93 84  Resp:  16 19   Temp: 98.1 F (36.7 C) 99.2 F (37.3 C) 98.3 F (36.8 C) 97.8 F (36.6 C)  TempSrc: Oral Oral  Oral  SpO2: 98% 97% 96% 98%  Weight:      Height:        Intake/Output Summary (Last 24 hours) at 06/14/2024 1841 Last data filed at 06/14/2024 1841 Gross per 24 hour  Intake 480 ml  Output --  Net 480 ml   Weight change:  Exam:  General:   Pt is alert, follows commands appropriately, not in acute distress HEENT: No icterus, No thrush, No neck mass, [place]/AT Cardiovascular: RRR, S1/S2, no rubs, no gallops Respiratory: CTA bilaterally, no wheezing, no crackles, no rhonchi Abdomen: Soft/+BS, non tender, non distended, no guarding Extremities: No edema, No lymphangitis, No petechiae, No rashes, no synovitis   Data Reviewed: I have personally reviewed following labs and imaging studies Basic Metabolic Panel: Recent Labs  Lab 06/12/24 1819 06/13/24 0454 06/13/24 0937 06/14/24 0426  NA 134* 135  --  132*  K 3.7 3.1*  --  3.8  CL 101 100  --  99  CO2 23 25  --  24  GLUCOSE 121* 105*  --  114*  BUN 15 12  --  11  CREATININE 0.70 0.64  --  0.75  CALCIUM  8.7* 8.6*  --  8.4*  MG  --   --  1.9 1.9   Liver Function Tests: Recent Labs  Lab 06/12/24 1819  AST 19  ALT 12  ALKPHOS 64  BILITOT 0.8  PROT 6.5  ALBUMIN  3.1*   No results for input(s): LIPASE, AMYLASE in the last 168 hours. No results for input(s): AMMONIA in the last 168 hours. Coagulation Profile: Recent Labs  Lab 06/12/24 1819  INR 1.1   CBC: Recent Labs  Lab  06/12/24 1819 06/13/24 0454 06/14/24 0426  WBC 14.0* 14.0* 14.2*  NEUTROABS 11.8*  --   --   HGB 10.8* 10.8* 10.5*  HCT 32.8* 32.5* 31.0*  MCV 89.1 89.0 90.9  PLT 176 177 207   Cardiac Enzymes: Recent Labs  Lab 06/13/24 0937  CKTOTAL 12*   BNP: Invalid input(s): POCBNP CBG: No results for input(s): GLUCAP in the last 168 hours. HbA1C: Recent Labs    06/13/24 0454  HGBA1C 5.1   Urine analysis:    Component Value Date/Time   COLORURINE YELLOW 11/02/2023 0057   APPEARANCEUR CLEAR 11/02/2023 0057   LABSPEC 1.008 11/02/2023 0057   PHURINE 7.0 11/02/2023 0057   GLUCOSEU NEGATIVE 11/02/2023 0057   HGBUR MODERATE (A) 11/02/2023 0057   BILIRUBINUR NEGATIVE 11/02/2023 0057   KETONESUR NEGATIVE 11/02/2023 0057   PROTEINUR NEGATIVE 11/02/2023 0057   NITRITE NEGATIVE  11/02/2023 0057   LEUKOCYTESUR MODERATE (A) 11/02/2023 0057   Sepsis Labs: @LABRCNTIP (procalcitonin:4,lacticidven:4) ) Recent Results (from the past 240 hours)  Culture, blood (Routine X 2) w Reflex to ID Panel     Status: None (Preliminary result)   Collection Time: 06/12/24 10:55 PM   Specimen: BLOOD  Result Value Ref Range Status   Specimen Description BLOOD BLOOD LEFT ARM  Final   Special Requests   Final    BOTTLES DRAWN AEROBIC AND ANAEROBIC Blood Culture adequate volume   Culture  Setup Time   Final    GRAM POSITIVE COCCI ANAEROBIC BOTTLE ONLY CRITICAL VALUE NOTED.  VALUE IS CONSISTENT WITH PREVIOUSLY REPORTED AND CALLED VALUE. JESSICA ELLER AT 2315 06/13/24 BY RONAL CLEVERLY Performed at [facility], [address] [place], [place] [address]    Culture [facility] POSITIVE COCCI  Final   Report Status PENDING  Incomplete  Culture, blood (Routine X 2) w Reflex to ID Panel     Status: None (Preliminary result)   Collection Time: 06/12/24 10:57 PM   Specimen: BLOOD  Result Value Ref Range Status   Specimen Description   Final    BLOOD BLOOD LEFT HAND Performed at [facility], [address], [place], [place] [address]    Special Requests   Final    BOTTLES DRAWN AEROBIC AND ANAEROBIC Blood Culture adequate volume Performed at [facility], [address], [place], [place] [address]    Culture  Setup Time   Final    GRAM POSITIVE COCCI ANAEROBIC BOTTLE ONLY Gram Stain Report Called to,Read Back By and Verified With: JESSICA ELLER,RN AT 2315 06/13/24 BY A. SNYDER CRITICAL RESULT CALLED TO, READ BACK BY AND VERIFIED WITH: LOISE MORONES RN 06/14/2024 @ 0414 BY AB Performed at [facility], [address], [place], [place] [address]    Culture GRAM POSITIVE COCCI  Final   Report Status PENDING  Incomplete  Blood Culture ID Panel (Reflexed)     Status: Abnormal   Collection Time: 06/12/24 10:57 PM  Result Value Ref Range Status   Enterococcus faecalis NOT DETECTED NOT DETECTED Final    Enterococcus Faecium NOT DETECTED NOT DETECTED Final   Listeria monocytogenes NOT DETECTED NOT DETECTED Final   Staphylococcus species DETECTED (A) NOT DETECTED Final    Comment: CRITICAL RESULT CALLED TO, READ BACK BY AND VERIFIED WITH: N BAUGH RN 06/14/2024 @ 0414 BY AB    Staphylococcus aureus (BCID) NOT DETECTED NOT DETECTED Final   Staphylococcus epidermidis DETECTED (A) NOT DETECTED Final    Comment: Methicillin (oxacillin) resistant coagulase negative staphylococcus. Possible blood culture contaminant (unless isolated  from more than one blood culture draw or clinical case suggests pathogenicity). No antibiotic treatment is indicated for blood  culture contaminants. CRITICAL RESULT CALLED TO, READ BACK BY AND VERIFIED WITH: N BAUGH RN 06/14/2024 @ 0414 BY AB    Staphylococcus lugdunensis NOT DETECTED NOT DETECTED Final   Streptococcus species NOT DETECTED NOT DETECTED Final   Streptococcus agalactiae NOT DETECTED NOT DETECTED Final   Streptococcus pneumoniae NOT DETECTED NOT DETECTED Final   Streptococcus pyogenes NOT DETECTED NOT DETECTED Final   A.calcoaceticus-baumannii NOT DETECTED NOT DETECTED Final   Bacteroides fragilis NOT DETECTED NOT DETECTED Final   Enterobacterales NOT DETECTED NOT DETECTED Final   Enterobacter cloacae complex NOT DETECTED NOT DETECTED Final   Escherichia coli NOT DETECTED NOT DETECTED Final   Klebsiella aerogenes NOT DETECTED NOT DETECTED Final   Klebsiella oxytoca NOT DETECTED NOT DETECTED Final   Klebsiella pneumoniae NOT DETECTED NOT DETECTED Final   Proteus species NOT DETECTED NOT DETECTED Final   Salmonella species NOT DETECTED NOT DETECTED Final   Serratia marcescens NOT DETECTED NOT DETECTED Final   Haemophilus influenzae NOT DETECTED NOT DETECTED Final   Neisseria meningitidis NOT DETECTED NOT DETECTED Final   Pseudomonas aeruginosa NOT DETECTED NOT DETECTED Final   Stenotrophomonas maltophilia NOT DETECTED NOT DETECTED Final   Candida  albicans NOT DETECTED NOT DETECTED Final   Candida auris NOT DETECTED NOT DETECTED Final   Candida glabrata NOT DETECTED NOT DETECTED Final   Candida krusei NOT DETECTED NOT DETECTED Final   Candida parapsilosis NOT DETECTED NOT DETECTED Final   Candida tropicalis NOT DETECTED NOT DETECTED Final   Cryptococcus neoformans/gattii NOT DETECTED NOT DETECTED Final   Methicillin resistance mecA/C DETECTED (A) NOT DETECTED Final    Comment: CRITICAL RESULT CALLED TO, READ BACK BY AND VERIFIED WITHBETHA LOISE MORONES RN 06/14/2024 @ 0414 BY AB Performed at [facility], [address] [place], [place] [address]   Urine Culture     Status: Abnormal   Collection Time: 06/13/24  1:20 PM   Specimen: Urine, Clean Catch  Result Value Ref Range Status   Specimen Description   Final    URINE, CLEAN CATCH Performed at [facility], [address], [place], [place] [address]    Special Requests   Final    NONE Performed at [facility], [address] [place], [place] [address]    Culture (A)  Final    <10,000 COLONIES/mL INSIGNIFICANT GROWTH Performed at [facility], [address] [place], [place] [address]    Report Status 06/14/2024 FINAL  Final  Culture, blood (Routine X 2) w Reflex to ID Panel     Status: None (Preliminary result)   Collection Time: 06/14/24  7:44 AM   Specimen: BLOOD  Result Value Ref Range Status   Specimen Description BLOOD RIGHT ANTECUBITAL  Final   Special Requests   Final    BOTTLES DRAWN AEROBIC AND ANAEROBIC Blood Culture adequate volume Performed at [facility], [address] [place], [place] [address]    Culture PENDING  Incomplete   Report Status PENDING  Incomplete  Culture, blood (Routine X 2) w Reflex to ID Panel     Status: None (Preliminary result)   Collection Time: 06/14/24  7:44 AM   Specimen: BLOOD  Result Value Ref Range Status   Specimen Description BLOOD LEFT ANTECUBITAL  Final   Special Requests   Final    BOTTLES DRAWN AEROBIC AND  ANAEROBIC Blood Culture adequate volume Performed at  [facility], [address], [place], [place] [address]    Culture PENDING  Incomplete   Report Status PENDING  Incomplete     Scheduled Meds:  aspirin   81 mg Oral Daily   finasteride   5 mg Oral QPM   pantoprazole  (PROTONIX ) IV  40 mg Intravenous Q24H   rosuvastatin   40 mg Oral Daily   Continuous Infusions:  [START ON 06/15/2024] vancomycin       Procedures/Studies: ECHOCARDIOGRAM COMPLETE Result Date: 06/14/2024    ECHOCARDIOGRAM REPORT   Patient Name:   BOWIE DELIA Date of Exam: 06/13/2024 Medical Rec #:  [id number]  Height:       62.0 in Accession #:    [id number] Weight:       153.2 lb Date of Birth:  1946/04/12   BSA:          1.707 m Patient Age:    77 years   BP:           118/71 mmHg Patient Gender: M          HR:           88 bpm. Exam Location:  [facility] Procedure: 2D Echo, Cardiac Doppler and Color Doppler (Both Spectral and Color            Flow Doppler were utilized during procedure). Indications:    Chest Pain  History:        Patient has prior history of Echocardiogram examinations. Risk                 Factors:Hypertension.  Sonographer:    Vella Key Referring Phys: [id number] EJIROGHENE E EMOKPAE IMPRESSIONS  1. Left ventricular ejection fraction, by estimation, is 55 to 60%. The left ventricle has normal function. There is mild concentric left ventricular hypertrophy. Indeterminate diastolic filling due to E-A fusion.  2. Right ventricular systolic function is normal. The right ventricular size is normal.  3. Right atrial size was moderately dilated.  4. Mild mitral valve regurgitation.  5. Tricuspid valve regurgitation is moderate to severe.  6. S/p AVR (23 mm Inspiris Resilia prosthesis; procedure date 11/02/23). Peak and mean gradients through the valve are 46 and 28 mm Hg respectively Acceleration time is 89 msec Ejection time is 209 msec (AT/ET is 0.39) DVI is 0.39. Compared to echo report from December 2024, mean gradient is  increased (12 to 28 mm Hg) and DVI is decreased (0.55 to 0.41) Cannot exclude obstruction to prosthesis.     Consider TEE to evaluate furtherr.. The aortic valve has been repaired/replaced. Aortic valve regurgitation is not visualized. Aortic valve sclerosis/calcification is present, without any evidence of aortic stenosis. FINDINGS  Left Ventricle: Left ventricular ejection fraction, by estimation, is 55 to 60%. The left ventricle has normal function. The left ventricular internal cavity size was normal in size. There is mild concentric left ventricular hypertrophy. Indeterminate diastolic filling due to E-A fusion. Right Ventricle: The right ventricular size is normal. Right vetricular wall thickness was not assessed. Right ventricular systolic function is normal. Left Atrium: Left atrial size was normal in size. Right Atrium: Right atrial size was moderately dilated. Pericardium: There is no evidence of pericardial effusion. Mitral Valve: There is mild thickening of the mitral valve leaflet(s). Mild mitral annular calcification. Mild mitral valve regurgitation. Tricuspid Valve: The tricuspid valve is normal in structure. Tricuspid valve regurgitation is moderate to severe. Aortic Valve: S/p AVR (23 mm Inspiris Resilia prosthesis; procedure date 11/02/23). Peak and mean gradients through the valve are  46 and 28 mm Hg respectively Acceleration time is 89 msec Ejection time is 209 msec (AT/ET is 0.39) DVI is 0.39. Compared to  echo report from December 2024, mean gradient is increased (12 to 28 mm Hg) and DVI is decreased (0.55 to 0.41) Cannot exclude obstruction to prosthesis. Consider TEE to evaluate furtherr. The aortic valve has been repaired/replaced. Aortic valve regurgitation is not visualized. Aortic valve sclerosis/calcification is present, without any evidence of aortic stenosis. Aortic valve mean gradient measures 23.5 mmHg. Aortic valve peak gradient measures 37.6 mmHg. Aortic valve area, by VTI measures  0.93 cm. Pulmonic Valve: The pulmonic valve was not well visualized. Pulmonic valve regurgitation is not visualized. Aorta: The aortic root is normal in size and structure. IAS/Shunts: No atrial level shunt detected by color flow Doppler.  LEFT VENTRICLE PLAX 2D LVIDd:         4.00 cm      Diastology LVIDs:         2.90 cm      LV e' medial:    7.40 cm/s LV PW:         1.20 cm      LV E/e' medial:  12.5 LV IVS:        1.20 cm      LV e' lateral:   9.36 cm/s LVOT diam:     1.70 cm      LV E/e' lateral: 9.9 LV SV:         49 LV SV Index:   29 LVOT Area:     2.27 cm  LV Volumes (MOD) LV vol d, MOD A2C: 104.0 ml LV vol d, MOD A4C: 154.0 ml LV vol s, MOD A2C: 52.1 ml LV vol s, MOD A4C: 69.7 ml LV SV MOD A2C:     51.9 ml LV SV MOD A4C:     154.0 ml LV SV MOD BP:      65.7 ml RIGHT VENTRICLE RV Basal diam:  5.00 cm RV S prime:     10.80 cm/s TAPSE (M-mode): 1.3 cm LEFT ATRIUM           Index        RIGHT ATRIUM           Index LA diam:      3.70 cm 2.17 cm/m   RA Area:     28.10 cm LA Vol (A2C): 94.7 ml 55.48 ml/m  RA Volume:   113.00 ml 66.20 ml/m LA Vol (A4C): 47.4 ml 27.77 ml/m  AORTIC VALVE AV Area (Vmax):    0.84 cm AV Area (Vmean):   0.81 cm AV Area (VTI):     0.93 cm AV Vmax:           306.50 cm/s AV Vmean:          230.500 cm/s AV VTI:            0.528 m AV Peak Grad:      37.6 mmHg AV Mean Grad:      23.5 mmHg LVOT Vmax:         114.00 cm/s LVOT Vmean:        82.200 cm/s LVOT VTI:          0.217 m LVOT/AV VTI ratio: 0.41  AORTA Ao Root diam: 2.90 cm MITRAL VALVE                TRICUSPID VALVE MV Area (PHT): 8.25 cm     TV Peak grad:  24.0 mmHg MV Decel Time: 92 msec      TV Vmax:        2.45 m/s MV E velocity: 92.20 cm/s   TR Peak grad:   24.0 mmHg MV A velocity: 132.00 cm/s  TR Vmax:        245.00 cm/s MV E/A ratio:  0.70                             SHUNTS                             Systemic VTI:  0.22 m                             Systemic Diam: 1.70 cm Vina Gull MD Electronically signed by Vina Gull MD Signature Date/Time: 06/14/2024/11:51:30 AM    Final    EEG adult Result Date: 06/14/2024 Shelton Arlin KIDD, MD     06/14/2024  7:41 AM Patient Name: Caileb Rhue MRN: [id number] Epilepsy Attending: Arlin KIDD Shelton Referring Physician/Provider: Evonnie Lenis, MD Date: 06/13/2024 Duration: 23.04 mins Patient history: 78yo M with episodes of shaking. EEG to evaluate for seizure. Level of alertness: Awake AEDs during EEG study: None Technical aspects: This EEG study was done with scalp electrodes positioned according to the 10-20 International system of electrode placement. Electrical activity was reviewed with band pass filter of 1-70Hz , sensitivity of 7 uV/mm, display speed of 72mm/sec with a 60Hz  notched filter applied as appropriate. EEG data were recorded continuously and digitally stored.  Video monitoring was available and reviewed as appropriate. Description: The posterior dominant rhythm consists of 8-9 Hz activity of moderate voltage (25-35 uV) seen predominantly in posterior head regions, symmetric and reactive to eye opening and eye closing. Physiologic photic driving was not seen during photic stimulation.  Hyperventilation was not performed.   IMPRESSION: This study is within normal limits. No seizures or epileptiform discharges were seen throughout the recording. A normal interictal EEG does not exclude the diagnosis of epilepsy. Priyanka KIDD Shelton   CT ANGIO NECK W OR WO CONTRAST Result Date: 06/13/2024 CLINICAL DATA:  Follow-up examination for stroke. EXAM: CT ANGIOGRAPHY NECK TECHNIQUE: Multidetector CT imaging of the neck was performed using the standard protocol during bolus administration of intravenous contrast. Multiplanar CT image reconstructions and MIPs were obtained to evaluate the vascular anatomy. Carotid stenosis measurements (when applicable) are obtained utilizing NASCET criteria, using the distal internal carotid diameter as the denominator. RADIATION DOSE REDUCTION: This exam was  performed according to the departmental dose-optimization program which includes automated exposure control, adjustment of the mA and/or kV according to patient size and/or use of iterative reconstruction technique. CONTRAST:  75mL OMNIPAQUE  IOHEXOL  350 MG/ML SOLN COMPARISON:  Prior brain MRI from earlier the same day. FINDINGS: Aortic arch: Visualized aortic arch within normal limits for caliber with standard 3 vessel morphology. Moderate aortic atherosclerosis. No high-grade stenosis about the origin the great vessels. Right carotid system: Right common and internal carotid arteries are patent without dissection. Moderate calcified plaque about the right carotid bulb/proximal right ICA without hemodynamically significant greater than 50% stenosis. Left carotid system: Left common and internal carotid arteries are patent without dissection. Moderate calcified plaque about the left carotid bulb/proximal cervical left ICA without hemodynamically significant greater than 50% stenosis. Vertebral arteries: Both vertebral arteries arise from subclavian arteries. No  significant proximal subclavian artery stenosis. Vertebral arteries M cells are patent without significant stenosis or dissection. Skeleton: No worrisome osseous lesions.  Prior sternotomy noted. Other neck: No other acute finding. Few small thyroid  nodules noted, largest of which measures 8 mm on the right. These are of doubtful significance given size and patient age, no follow-up imaging recommended (ref: J Am Coll Radiol. 2015 Feb;12(2): 143-50). Upper chest: Upper lobe predominant emphysema.  No acute finding. IMPRESSION: 1. Negative CTA for large vessel occlusion. 2. Moderate atherosclerotic change about the carotid bifurcations/proximal ICAs without hemodynamically significant greater than 50% stenosis. 3. Wide patency of both vertebral arteries within the neck. 4. Aortic Atherosclerosis (ICD10-I70.0) and Emphysema (ICD10-J43.9). Electronically Signed    By: Morene Hoard M.D.   On: 06/13/2024 22:56   MR BRAIN WO CONTRAST Result Date: 06/13/2024 CLINICAL DATA:  seizure like activity EXAM: MRI HEAD WITHOUT CONTRAST MRA HEAD WITHOUT CONTRAST TECHNIQUE: Multiplanar, multi-echo pulse sequences of the brain and surrounding structures were acquired without intravenous contrast. Angiographic images of the Circle of Willis were acquired using MRA technique without intravenous contrast. COMPARISON:  CT head 15 2007. 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 No significant mass effect or midline shift. Additional patchy T2/FLAIR hyperintensity in the white matter compatible with microvascular ischemic disease. No evidence of acute hemorrhage, mass lesion, or hydrocephalus. Vascular: See below. Skull and upper cervical spine: Normal marrow signal. Sinuses/Orbits: Mostly clear sinuses.  No acute orbital findings. Other: No mastoid effusions. MRA HEAD FINDINGS Anterior circulation: Bilateral intracranial ICAs, MCAs, and ACAs are patent without proximal hemodynamically significant stenosis. Posterior circulation:Bilateral intradural vertebral arteries, basilar artery and bilateral posterior cerebral arteries are patent without proximally in amic least significant stenosis. IMPRESSION: 1. 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 2. No emergent large vessel occlusion or proximal hemodynamically significant stenosis. Electronically Signed   By: Gilmore GORMAN Molt M.D.   On: 06/13/2024 16:11   MR ANGIO HEAD WO CONTRAST Result Date: 06/13/2024 CLINICAL DATA:  seizure like activity EXAM: MRI HEAD WITHOUT CONTRAST MRA HEAD WITHOUT CONTRAST TECHNIQUE: Multiplanar, multi-echo pulse sequences of the brain and surrounding structures were acquired without intravenous contrast. Angiographic images of the Circle of Willis were acquired using MRA technique without intravenous contrast. COMPARISON:  CT head 15 2007. 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 No significant mass effect or midline shift. Additional patchy T2/FLAIR hyperintensity in the white matter compatible with microvascular ischemic disease. No evidence of acute hemorrhage, mass lesion, or hydrocephalus. Vascular: See below. Skull and upper cervical spine: Normal marrow signal. Sinuses/Orbits: Mostly clear sinuses.  No acute orbital findings. Other: No mastoid effusions. MRA HEAD FINDINGS Anterior circulation: Bilateral intracranial ICAs, MCAs, and ACAs are patent without proximal hemodynamically significant stenosis. Posterior circulation:Bilateral intradural vertebral arteries, basilar artery and bilateral posterior cerebral arteries are patent without proximally in amic least significant stenosis. IMPRESSION: 1. 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 2. No emergent large vessel occlusion or proximal hemodynamically significant stenosis. Electronically Signed   By: Gilmore GORMAN Molt M.D.   On: 06/13/2024 16:11   US  Venous Img Lower Bilateral Result Date: 06/13/2024 CLINICAL DATA:  Bilateral leg swelling. EXAM: BILATERAL LOWER EXTREMITY VENOUS DOPPLER ULTRASOUND TECHNIQUE: Gray-scale sonography with graded compression, as well as color Doppler and duplex ultrasound were performed to evaluate the lower extremity deep venous systems from the level of the common femoral vein and including the common femoral, femoral, profunda femoral,  popliteal and calf veins including the posterior tibial, peroneal and gastrocnemius veins when visible. The superficial great saphenous vein was also interrogated. Spectral Doppler was utilized to evaluate flow at rest and with distal augmentation maneuvers in the common femoral, femoral and popliteal veins. COMPARISON:  None Available. FINDINGS: RIGHT LOWER EXTREMITY Common Femoral Vein: No evidence of thrombus. Normal compressibility, respiratory phasicity and response to augmentation.  Saphenofemoral Junction: No evidence of thrombus. Normal compressibility and flow on color Doppler imaging. Profunda Femoral Vein: No evidence of thrombus. Normal compressibility and flow on color Doppler imaging. Femoral Vein: No evidence of thrombus. Normal compressibility, respiratory phasicity and response to augmentation. Popliteal Vein: No evidence of thrombus. Normal compressibility, respiratory phasicity and response to augmentation. Calf Veins: No evidence of thrombus. Normal compressibility and flow on color Doppler imaging. Superficial Great Saphenous Vein: No evidence of thrombus. Normal compressibility. Other Findings:  None. LEFT LOWER EXTREMITY Common Femoral Vein: No evidence of thrombus. Normal compressibility, respiratory phasicity and response to augmentation. Saphenofemoral Junction: No evidence of thrombus. Normal compressibility and flow on color Doppler imaging. Profunda Femoral Vein: No evidence of thrombus. Normal compressibility and flow on color Doppler imaging. Femoral Vein: No evidence of thrombus. Normal compressibility, respiratory phasicity and response to augmentation. Popliteal Vein: No evidence of thrombus. Normal compressibility, respiratory phasicity and response to augmentation. Calf Veins: No evidence of thrombus. Normal compressibility and flow on color Doppler imaging. Superficial Great Saphenous Vein: No evidence of thrombus. Normal compressibility. Other Findings:  None. IMPRESSION: No evidence of deep venous thrombosis in either lower extremity. Electronically Signed   By: Juliene Balder M.D.   On: 06/13/2024 10:52   CT Angio Chest PE W and/or Wo Contrast Result Date: 06/12/2024 CLINICAL DATA:  Positive D-dimer and chest pain, initial encounter EXAM: CT ANGIOGRAPHY CHEST WITH CONTRAST TECHNIQUE: Multidetector CT imaging of the chest was performed using the standard protocol during bolus administration of intravenous contrast. Multiplanar CT image reconstructions and MIPs  were obtained to evaluate the vascular anatomy. RADIATION DOSE REDUCTION: This exam was performed according to the departmental dose-optimization program which includes automated exposure control, adjustment of the mA and/or kV according to patient size and/or use of iterative reconstruction technique. CONTRAST:  75mL OMNIPAQUE  IOHEXOL  350 MG/ML SOLN COMPARISON:  Chest x-ray from earlier in the same day. CT from 10/10/2023 FINDINGS: Cardiovascular: Atherosclerotic calcifications of the aorta are noted. No aneurysmal dilatation is seen. 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 Coronary calcifications are noted. The pulmonary artery shows a normal branching pattern bilaterally. No filling defect to suggest pulmonary embolism is noted. Mediastinum/Nodes: Thoracic inlet is within normal limits. No hilar or mediastinal adenopathy is noted. The esophagus as visualized is within normal limits. Lungs/Pleura: Lungs are well aerated bilaterally. Mild emphysematous changes are seen. Parenchymal nodule is seen. No infiltrate or effusion is noted. Upper Abdomen: Visualized upper abdomen demonstrates stable simple cyst within the right lobe of the liver. Musculoskeletal: Degenerative changes of the thoracic spine are seen. No acute bony abnormality is noted. Review of the MIP images confirms the above findings. IMPRESSION: No evidence of pulmonary embolism. No acute abnormality seen. Aortic Atherosclerosis (ICD10-I70.0) and Emphysema (ICD10-J43.9). Electronically Signed   By: Oneil Devonshire M.D.   On: 06/12/2024 19:24   DG Chest Port 1 View Result Date: 06/12/2024 CLINICAL DATA:  Chest pain EXAM: PORTABLE CHEST 1 VIEW COMPARISON:  05/07/2024 FINDINGS: Prior CABG and valve replacement. Heart and mediastinal contours are within normal limits. No focal opacities or effusions. No acute bony  abnormality. Aortic atherosclerosis. IMPRESSION: No active cardiopulmonary disease. Electronically Signed   By: Franky Crease M.D.    On: 06/12/2024 18:05    Alm Schneider, DO  [facility]  If 7PM-7AM, please contact night-coverage [email or web address] Password [facility] 06/14/2024, 6:41 PM   LOS: 0 days

## 2024-06-14 NOTE — Progress Notes (Addendum)
 Rounding Note   Patient Name: Kyle Matthews Date of Encounter: 06/14/2024  [facility] Cardiologist: Arun K Thukkani, MD   Subjective No complaints  Scheduled Meds:  aspirin   81 mg Oral Daily   finasteride   5 mg Oral QPM   pantoprazole  (PROTONIX ) IV  40 mg Intravenous Q24H   rosuvastatin   40 mg Oral Daily   Continuous Infusions:  vancomycin      PRN Meds: acetaminophen  **OR** acetaminophen , furosemide , ondansetron  **OR** ondansetron  (ZOFRAN ) IV, polyethylene glycol   Vital Signs  Vitals:   06/13/24 0522 06/13/24 1221 06/13/24 2026 06/14/24 0615  BP: 129/76 127/71 122/69 123/76  Pulse: (!) 101 88 86 93  Resp: 17  16 19   Temp: 98.2 F (36.8 C) 98.1 F (36.7 C) 99.2 F (37.3 C) 98.3 F (36.8 C)  TempSrc: Oral Oral Oral   SpO2: 96% 98% 97% 96%  Weight:      Height:        Intake/Output Summary (Last 24 hours) at 06/14/2024 0916 Last data filed at 06/14/2024 0900 Gross per 24 hour  Intake 240 ml  Output --  Net 240 ml      06/12/2024   11:30 PM 06/12/2024    4:57 PM 05/22/2024   11:07 AM  Last 3 Weights  Weight (lbs) 153 lb 3.5 oz 156 lb 8.4 oz 156 lb 8.4 oz  Weight (kg) 69.5 kg 71 kg 71 kg      Telemetry NSR - Personally Reviewed  ECG  N/a - Personally Reviewed  Physical Exam  GEN: No acute distress.   Neck: No JVD Cardiac: RRR, no murmurs, rubs, or gallops.  Respiratory: Clear to auscultation bilaterally. GI: Soft, nontender, non-distended  MS: No edema; No deformity. Neuro:  Nonfocal  Psych: Normal affect   Labs High Sensitivity Troponin:   Recent Labs  Lab 06/12/24 1819 06/12/24 1928  TROPONINIHS 99* 86*     Chemistry Recent Labs  Lab 06/12/24 1819 06/13/24 0454 06/13/24 0937 06/14/24 0426  NA 134* 135  --  132*  K 3.7 3.1*  --  3.8  CL 101 100  --  99  CO2 23 25  --  24  GLUCOSE 121* 105*  --  114*  BUN 15 12  --  11  CREATININE 0.70 0.64  --  0.75  CALCIUM  8.7* 8.6*  --  8.4*  MG  --   --  1.9 1.9  PROT 6.5  --   --    --   ALBUMIN  3.1*  --   --   --   AST 19  --   --   --   ALT 12  --   --   --   ALKPHOS 64  --   --   --   BILITOT 0.8  --   --   --   GFRNONAA >60 >60  --  >60  ANIONGAP 10 10  --  9    Lipids  Recent Labs  Lab 06/14/24 0426  CHOL 124  TRIG 91  HDL 40*  LDLCALC 66  CHOLHDL 3.1    Hematology Recent Labs  Lab 06/12/24 1819 06/13/24 0454 06/14/24 0426  WBC 14.0* 14.0* 14.2*  RBC 3.68* 3.65* 3.41*  HGB 10.8* 10.8* 10.5*  HCT 32.8* 32.5* 31.0*  MCV 89.1 89.0 90.9  MCH 29.3 29.6 30.8  MCHC 32.9 33.2 33.9  RDW 13.6 13.6 13.8  PLT 176 177 207   Thyroid   Recent Labs  Lab 06/12/24 1928  TSH  0.937    BNP Recent Labs  Lab 06/12/24 1819  BNP 169.0*    DDimer  Recent Labs  Lab 06/12/24 1819  DDIMER >20.00*     Radiology  EEG adult Result Date: 06/14/2024 Shelton Arlin KIDD, MD     06/14/2024  7:41 AM Patient Name: Kyle Matthews MRN: [id number] Epilepsy Attending: Arlin KIDD Shelton Referring Physician/Provider: Evonnie Lenis, MD Date: 06/13/2024 Duration: 23.04 mins Patient history: 78yo M with episodes of shaking. EEG to evaluate for seizure. Level of alertness: Awake AEDs during EEG study: None Technical aspects: This EEG study was done with scalp electrodes positioned according to the 10-20 International system of electrode placement. Electrical activity was reviewed with band pass filter of 1-70Hz , sensitivity of 7 uV/mm, display speed of 62mm/sec with a 60Hz  notched filter applied as appropriate. EEG data were recorded continuously and digitally stored.  Video monitoring was available and reviewed as appropriate. Description: The posterior dominant rhythm consists of 8-9 Hz activity of moderate voltage (25-35 uV) seen predominantly in posterior head regions, symmetric and reactive to eye opening and eye closing. Physiologic photic driving was not seen during photic stimulation.  Hyperventilation was not performed.   IMPRESSION: This study is within normal limits. No seizures or  epileptiform discharges were seen throughout the recording. A normal interictal EEG does not exclude the diagnosis of epilepsy. Priyanka KIDD Shelton   CT ANGIO NECK W OR WO CONTRAST Result Date: 06/13/2024 CLINICAL DATA:  Follow-up examination for stroke. EXAM: CT ANGIOGRAPHY NECK TECHNIQUE: Multidetector CT imaging of the neck was performed using the standard protocol during bolus administration of intravenous contrast. Multiplanar CT image reconstructions and MIPs were obtained to evaluate the vascular anatomy. Carotid stenosis measurements (when applicable) are obtained utilizing NASCET criteria, using the distal internal carotid diameter as the denominator. RADIATION DOSE REDUCTION: This exam was performed according to the departmental dose-optimization program which includes automated exposure control, adjustment of the mA and/or kV according to patient size and/or use of iterative reconstruction technique. CONTRAST:  75mL OMNIPAQUE  IOHEXOL  350 MG/ML SOLN COMPARISON:  Prior brain MRI from earlier the same day. FINDINGS: Aortic arch: Visualized aortic arch within normal limits for caliber with standard 3 vessel morphology. Moderate aortic atherosclerosis. No high-grade stenosis about the origin the great vessels. Right carotid system: Right common and internal carotid arteries are patent without dissection. Moderate calcified plaque about the right carotid bulb/proximal right ICA without hemodynamically significant greater than 50% stenosis. Left carotid system: Left common and internal carotid arteries are patent without dissection. Moderate calcified plaque about the left carotid bulb/proximal cervical left ICA without hemodynamically significant greater than 50% stenosis. Vertebral arteries: Both vertebral arteries arise from subclavian arteries. No significant proximal subclavian artery stenosis. Vertebral arteries M cells are patent without significant stenosis or dissection. Skeleton: No worrisome osseous  lesions.  Prior sternotomy noted. Other neck: No other acute finding. Few small thyroid  nodules noted, largest of which measures 8 mm on the right. These are of doubtful significance given size and patient age, no follow-up imaging recommended (ref: J Am Coll Radiol. 2015 Feb;12(2): 143-50). Upper chest: Upper lobe predominant emphysema.  No acute finding. IMPRESSION: 1. Negative CTA for large vessel occlusion. 2. Moderate atherosclerotic change about the carotid bifurcations/proximal ICAs without hemodynamically significant greater than 50% stenosis. 3. Wide patency of both vertebral arteries within the neck. 4. Aortic Atherosclerosis (ICD10-I70.0) and Emphysema (ICD10-J43.9). Electronically Signed   By: Morene Hoard M.D.   On: 06/13/2024 22:56   MR BRAIN WO CONTRAST Result  Date: 06/13/2024 CLINICAL DATA:  seizure like activity EXAM: MRI HEAD WITHOUT CONTRAST MRA HEAD WITHOUT CONTRAST TECHNIQUE: Multiplanar, multi-echo pulse sequences of the brain and surrounding structures were acquired without intravenous contrast. Angiographic images of the Circle of Willis were acquired using MRA technique without intravenous contrast. COMPARISON:  CT head 15 2007. 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 No significant mass effect or midline shift. Additional patchy T2/FLAIR hyperintensity in the white matter compatible with microvascular ischemic disease. No evidence of acute hemorrhage, mass lesion, or hydrocephalus. Vascular: See below. Skull and upper cervical spine: Normal marrow signal. Sinuses/Orbits: Mostly clear sinuses.  No acute orbital findings. Other: No mastoid effusions. MRA HEAD FINDINGS Anterior circulation: Bilateral intracranial ICAs, MCAs, and ACAs are patent without proximal hemodynamically significant stenosis. Posterior circulation:Bilateral intradural vertebral arteries, basilar artery and bilateral posterior cerebral arteries are patent without  proximally in amic least significant stenosis. IMPRESSION: 1. 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 2. No emergent large vessel occlusion or proximal hemodynamically significant stenosis. Electronically Signed   By: Gilmore GORMAN Molt M.D.   On: 06/13/2024 16:11   MR ANGIO HEAD WO CONTRAST Result Date: 06/13/2024 CLINICAL DATA:  seizure like activity EXAM: MRI HEAD WITHOUT CONTRAST MRA HEAD WITHOUT CONTRAST TECHNIQUE: Multiplanar, multi-echo pulse sequences of the brain and surrounding structures were acquired without intravenous contrast. Angiographic images of the Circle of Willis were acquired using MRA technique without intravenous contrast. COMPARISON:  CT head 15 2007. 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 No significant mass effect or midline shift. Additional patchy T2/FLAIR hyperintensity in the white matter compatible with microvascular ischemic disease. No evidence of acute hemorrhage, mass lesion, or hydrocephalus. Vascular: See below. Skull and upper cervical spine: Normal marrow signal. Sinuses/Orbits: Mostly clear sinuses.  No acute orbital findings. Other: No mastoid effusions. MRA HEAD FINDINGS Anterior circulation: Bilateral intracranial ICAs, MCAs, and ACAs are patent without proximal hemodynamically significant stenosis. Posterior circulation:Bilateral intradural vertebral arteries, basilar artery and bilateral posterior cerebral arteries are patent without proximally in amic least significant stenosis. IMPRESSION: 1. 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 2. No emergent large vessel occlusion or proximal hemodynamically significant stenosis. Electronically Signed   By: Gilmore GORMAN Molt M.D.   On: 06/13/2024 16:11   US  Venous Img Lower Bilateral Result Date: 06/13/2024 CLINICAL DATA:  Bilateral leg swelling. EXAM: BILATERAL LOWER EXTREMITY VENOUS DOPPLER ULTRASOUND TECHNIQUE: Gray-scale sonography with graded compression, as  well as color Doppler and duplex ultrasound were performed to evaluate the lower extremity deep venous systems from the level of the common femoral vein and including the common femoral, femoral, profunda femoral, popliteal and calf veins including the posterior tibial, peroneal and gastrocnemius veins when visible. The superficial great saphenous vein was also interrogated. Spectral Doppler was utilized to evaluate flow at rest and with distal augmentation maneuvers in the common femoral, femoral and popliteal veins. COMPARISON:  None Available. FINDINGS: RIGHT LOWER EXTREMITY Common Femoral Vein: No evidence of thrombus. Normal compressibility, respiratory phasicity and response to augmentation. Saphenofemoral Junction: No evidence of thrombus. Normal compressibility and flow on color Doppler imaging. Profunda Femoral Vein: No evidence of thrombus. Normal compressibility and flow on color Doppler imaging. Femoral Vein: No evidence of thrombus. Normal compressibility, respiratory phasicity and response to augmentation. Popliteal Vein: No evidence of thrombus. Normal compressibility, respiratory phasicity and response to augmentation. Calf Veins: No evidence of thrombus. Normal compressibility and flow on color Doppler imaging. Superficial  Great Saphenous Vein: No evidence of thrombus. Normal compressibility. Other Findings:  None. LEFT LOWER EXTREMITY Common Femoral Vein: No evidence of thrombus. Normal compressibility, respiratory phasicity and response to augmentation. Saphenofemoral Junction: No evidence of thrombus. Normal compressibility and flow on color Doppler imaging. Profunda Femoral Vein: No evidence of thrombus. Normal compressibility and flow on color Doppler imaging. Femoral Vein: No evidence of thrombus. Normal compressibility, respiratory phasicity and response to augmentation. Popliteal Vein: No evidence of thrombus. Normal compressibility, respiratory phasicity and response to augmentation. Calf  Veins: No evidence of thrombus. Normal compressibility and flow on color Doppler imaging. Superficial Great Saphenous Vein: No evidence of thrombus. Normal compressibility. Other Findings:  None. IMPRESSION: No evidence of deep venous thrombosis in either lower extremity. Electronically Signed   By: Juliene Balder M.D.   On: 06/13/2024 10:52   CT Angio Chest PE W and/or Wo Contrast Result Date: 06/12/2024 CLINICAL DATA:  Positive D-dimer and chest pain, initial encounter EXAM: CT ANGIOGRAPHY CHEST WITH CONTRAST TECHNIQUE: Multidetector CT imaging of the chest was performed using the standard protocol during bolus administration of intravenous contrast. Multiplanar CT image reconstructions and MIPs were obtained to evaluate the vascular anatomy. RADIATION DOSE REDUCTION: This exam was performed according to the departmental dose-optimization program which includes automated exposure control, adjustment of the mA and/or kV according to patient size and/or use of iterative reconstruction technique. CONTRAST:  75mL OMNIPAQUE  IOHEXOL  350 MG/ML SOLN COMPARISON:  Chest x-ray from earlier in the same day. CT from 10/10/2023 FINDINGS: Cardiovascular: Atherosclerotic calcifications of the aorta are noted. No aneurysmal dilatation is seen. 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 Coronary calcifications are noted. The pulmonary artery shows a normal branching pattern bilaterally. No filling defect to suggest pulmonary embolism is noted. Mediastinum/Nodes: Thoracic inlet is within normal limits. No hilar or mediastinal adenopathy is noted. The esophagus as visualized is within normal limits. Lungs/Pleura: Lungs are well aerated bilaterally. Mild emphysematous changes are seen. Parenchymal nodule is seen. No infiltrate or effusion is noted. Upper Abdomen: Visualized upper abdomen demonstrates stable simple cyst within the right lobe of the liver. Musculoskeletal: Degenerative changes of the thoracic spine are seen. No  acute bony abnormality is noted. Review of the MIP images confirms the above findings. IMPRESSION: No evidence of pulmonary embolism. No acute abnormality seen. Aortic Atherosclerosis (ICD10-I70.0) and Emphysema (ICD10-J43.9). Electronically Signed   By: Oneil Devonshire M.D.   On: 06/12/2024 19:24   DG Chest Port 1 View Result Date: 06/12/2024 CLINICAL DATA:  Chest pain EXAM: PORTABLE CHEST 1 VIEW COMPARISON:  05/07/2024 FINDINGS: Prior CABG and valve replacement. Heart and mediastinal contours are within normal limits. No focal opacities or effusions. No acute bony abnormality. Aortic atherosclerosis. IMPRESSION: No active cardiopulmonary disease. Electronically Signed   By: Franky Crease M.D.   On: 06/12/2024 18:05    Cardiac Studies   Patient Profile   Kyle Matthews is a 78 y.o. male with a hx of CAD (s/p CABG in 10/2023 with LIMA-LAD and SVG-Ramus at the time of AVR), severe AS (s/p AVR with 23 mm Edwards Inspiris Resilia pericardial valve in 10/2023), post-op atrial fibrillation following CABG and AVR, HTN, HLD, history of CVA, COPD, tobacco use, anemia and BPH who is being seen 06/13/2024 for the evaluation of chest pain and elevated troponin values at the request of Dr. CHARLENA Carwin.   Assessment & Plan  1.CAD/chest pain - CABG LIMA-LAD, SVG-Ramus 10/2023, - chest pain is atypical, lasting around 10 hours constant - fairly low probability  acute obstructive disease would have developed since 10/2023 CABG - mild trop 99 trending down - EKG without specific ischemic changes - echo pending, done yesterday but needed additional images of aortic valve.  - at this time low suspicion for ACS, no immediate plans for ischemic testing. Heparin  discontinued   2. Sinus tach - little variability in heart rates fairly constant 95-100 raises questions about possible atach/ectopic atrial arrhythmia however there is not a marked change in P wave morphology from baseline - from further tele review sinus rhythm,  mild sinus tach at times. No arrhythmias  3. History of AVR  - s/p AVR with 23 mm Edwards Inspiris Resilia pericardial valve in 10/2023 - echo done yesterday but needs additional imaging of valve which will be done today - no exertional symptoms, no significant murmur on exam.   4. CVA - MRI with small acute infarct left occipital lobe - followed by neurology  5. Hematuria - per primary team, patient off heparin    250pm addendum Echo reviewed. Elevation in mean gradient across his bioprosthetic valve. From 11/2023 mean gradient 12 mmHg, on current study 28 mmHg. AT 88, ET 258, AT/T ratio 0.34, DVI 0.38.  Essentially rise in gradients with borderline changes in other criteria. The morphology of the valve itself is poorly visualized. Would benefit from additional imaging, either TEE or cardiac CT. More complicated by incidental finding of CVA. Discuss with primary team timing of additional imaging.    For questions or updates, please contact [facility] Please consult [email or web address] for contact info under       Signed, Alvan Carrier, MD  06/14/2024, 9:16 AM

## 2024-06-14 NOTE — Consult Note (Signed)
 I connected with  Arther Heisler on 06/14/24 by a video enabled telemedicine application and verified that I am speaking with the correct person using two identifiers.   I discussed the limitations of evaluation and management by telemedicine. The patient expressed understanding and agreed to proceed.  Location of patient: [facility] Condition of physician: [facility]   Neurology Consultation Reason for Consult: Stroke Referring Physician: Dr. Alm Tat  CC: Chest pain  History is obtained from: Patient, chart review  HPI: Kyle Matthews is a 78 y.o. male with past medical history of hypertension, hyperlipidemia, coronary artery disease status post CABG in November 2024 on aspirin  81 mg daily, prior stroke with no residual deficits but initially presented due to chest pain and elevated troponins.  Patient was evaluated by cardiology and started on heparin  drip.  Unfortunately this led to hematuria.  However the rest of the workup was negative for PE and MI.  Therefore heparin  drip was stopped.  Hematuria is improving but not completely resolved yet  Patient was also noted to have leukocytosis and started on antibiotics.  While in the hospital patient reported that he has been having intermittent chills where he feels cold, has shaking in bilateral upper and lower extremities without any loss of consciousness lasting for 10 to 15 minutes followed by complete resolution daily every other day for the last few days.  Routine EEG was done which was normal.  MRI brain was obtained which showed acute stroke.  Therefore neurology was consulted  Patient denies any prior history of epilepsy or seizures, denies family history of epilepsy, denies head injury or loss of consciousness, denies recent medication changes except be started on Lasix  due to pedal edema.  Denies any history of meningitis/encephalitis, febrile seizures  ROS: All other systems reviewed and negative except as noted in the  HPI.   Past Medical History:  Diagnosis Date   Anemia    Anxiety    Arthritis    BPH (benign prostatic hyperplasia)    Status post biopsy in 2009. - w/ LUTS   Coronary artery disease    a. s/p CABG in 10/2023 with LIMA-LAD and SVG-Ramus at the time of AVR   Depression    Essential hypertension    Hyperlipidemia    On Crestor  - followed by PCP   Moderate calcific aortic stenosis 04/2017   a. s/p AVR with 23 mm Edwards Inspiris Resilia pericardial valve in 10/2023   Stroke [facility]) 2007   No true etiology noted   UTI (urinary tract infection)     Family History  Problem Relation Age of Onset   Coronary artery disease Mother 2       29. Was diagnosed with a blood clot - suspect that this was a STEMI   Lung cancer Father    Thyroid  disease Sister    Healthy Brother 102   Pancreatic cancer Sister 5   Cancer Maternal Grandmother    Heart disease Maternal Grandfather 86   Cancer Paternal Grandmother    Stroke Paternal Grandfather     Social History:  reports that he has been smoking cigarettes. He has a 36 pack-year smoking history. He has never used smokeless tobacco. He reports that he does not currently use alcohol after a past usage of about 6.0 standard drinks of alcohol per week. He reports that he does not use drugs.   Medications Prior to Admission  Medication Sig Dispense Refill Last Dose/Taking   amLODipine  (NORVASC ) 5 MG tablet  Take 1 tablet (5 mg total) by mouth daily. 30 tablet 0 06/11/2024 Morning   aspirin  81 MG chewable tablet Chew 81 mg by mouth daily. Swallow whole.   06/12/2024 at  4:15 PM   ciprofloxacin  (CIPRO ) 500 MG tablet Take 500 mg by mouth 2 (two) times daily.   06/11/2024 Bedtime   docusate sodium  (COLACE) 100 MG capsule Take 1 capsule (100 mg total) by mouth 2 (two) times daily. (Patient taking differently: Take 100 mg by mouth daily as needed for mild constipation.)   Past Month   Ferrous Gluconate (IRON  27 PO) Take 27 mg by mouth daily.   06/11/2024  Morning   finasteride  (PROSCAR ) 5 MG tablet Take 5 mg by mouth every evening.   06/11/2024 Bedtime   furosemide  (LASIX ) 20 MG tablet TAKE 1 TABLET(20 MG) BY MOUTH DAILY AS NEEDED (Patient taking differently: Take 20 mg by mouth daily as needed for fluid or edema.) 30 tablet 8 06/11/2024 Morning   nitroGLYCERIN  (NITROSTAT ) 0.4 MG SL tablet Place 1 tablet (0.4 mg total) under the tongue every 5 (five) minutes as needed for chest pain. 25 tablet 3 06/12/2024 at  4:15 PM   rosuvastatin  (CRESTOR ) 40 MG tablet Take 40 mg by mouth daily.   06/11/2024 Morning      Exam: Current vital signs: BP 123/76 (BP Location: Left Arm)   Pulse 93   Temp 98.3 F (36.8 C)   Resp 19   Ht 5' 2 (1.575 m)   Wt 69.5 kg   SpO2 96%   BMI 28.02 kg/m  Vital signs in last 24 hours: Temp:  [98.1 F (36.7 C)-99.2 F (37.3 C)] 98.3 F (36.8 C) (06/27 0615) Pulse Rate:  [86-93] 93 (06/27 0615) Resp:  [16-19] 19 (06/27 0615) BP: (122-127)/(69-76) 123/76 (06/27 0615) SpO2:  [96 %-98 %] 96 % (06/27 0615)   Physical Exam  Constitutional: Appears well-developed and well-nourished.  Psych: Affect appropriate to situation Neuro: AO x 3, cranial nerves grossly intact, antigravity strength without drift in all 4 extremities, sensation intact to light touch, FTN intact bilaterally  NIHSS 0  I have reviewed labs in epic and the results pertinent to this consultation are: CBC:  Recent Labs  Lab 06/12/24 1819 06/13/24 0454 06/14/24 0426  WBC 14.0* 14.0* 14.2*  NEUTROABS 11.8*  --   --   HGB 10.8* 10.8* 10.5*  HCT 32.8* 32.5* 31.0*  MCV 89.1 89.0 90.9  PLT 176 177 207    Basic Metabolic Panel:  Lab Results  Component Value Date   NA 132 (L) 06/14/2024   K 3.8 06/14/2024   CO2 24 06/14/2024   GLUCOSE 114 (H) 06/14/2024   BUN 11 06/14/2024   CREATININE 0.75 06/14/2024   CALCIUM  8.4 (L) 06/14/2024   GFRNONAA >60 06/14/2024   Lipid Panel:  Lab Results  Component Value Date   LDLCALC 66 06/14/2024    HgbA1c:  Lab Results  Component Value Date   HGBA1C 5.2 11/02/2023   Urine Drug Screen: No results found for: LABOPIA, COCAINSCRNUR, LABBENZ, AMPHETMU, THCU, LABBARB  Alcohol Level No results found for: ETH   I have reviewed the images obtained:  CT angio Neck with contrast 06/13/2024: Negative CTA for large vessel occlusion.  Moderate atherosclerotic change about the carotid bifurcations/proximal ICAs without hemodynamically significant greater than 50% stenosis. Wide patency of both vertebral arteries within the neck. Aortic Atherosclerosis (ICD10-I70.0) and   MRI Brain and MRA head wo contrast 06/13/2024: Small acute infarct in the left occipital lobe.  No emergent large vessel occlusion or proximal hemodynamically significant stenosis.    ASSESSMENT/PLAN: 78 year old male who initially presented with chest pain and was noted to have some shaking/chills and therefore MRI brain was obtained which showed acute ischemic stroke.  Acute ischemic stroke (incidental) - Etiology: Likely small vessel disease  -Patient is currently on aspirin  81 mg daily.  Discussed about switching to Plavix  75 mg daily for secondary stroke prevention.  However in setting of materia, patient is very hesitant to make any changes right now.  This can be discussed again during outpatient follow-up visit -LDL 66, continue current statin - TTE ordered and pending.  If no evidence of thrombus, no further workup required from neurology standpoint - Discussed plan with patient and Dr. Evonnie  Generalized shaking - Semiology of the episodes does not sound highly suspicious for seizures.  These could be secondary to chills versus medication side effect. Patient reports being on Lasix  recently which has also made him dizzy therefore orthostatic tremors are on the differential although less likely -Due to low clinical suspicion, no cortical abnormality on MRI and negative EEG, will hold off on starting  antiseizure medications for now. - I discussed with patient to take a video of the episode If possible - Also discussed with him to call our clinic if episodes persist so we can plan for an ambulatory EEG - Patient expressed understanding and is in agreement with this plan   Thank you for allowing us  to participate in the care of this patient. If you have any further questions, please contact  me or neurohospitalist.   Arlin Krebs Epilepsy [facility]

## 2024-06-14 NOTE — Procedures (Signed)
 Patient Name: Kyle Matthews  MRN: [id number]  Epilepsy Attending: Arlin MALVA Krebs  Referring Physician/Provider: Evonnie Lenis, MD  Date: 06/13/2024 Duration: 23.04 mins  Patient history: 78yo M with episodes of shaking. EEG to evaluate for seizure.  Level of alertness: Awake  AEDs during EEG study: None  Technical aspects: This EEG study was done with scalp electrodes positioned according to the 10-20 International system of electrode placement. Electrical activity was reviewed with band pass filter of 1-70Hz , sensitivity of 7 uV/mm, display speed of 11mm/sec with a 60Hz  notched filter applied as appropriate. EEG data were recorded continuously and digitally stored.  Video monitoring was available and reviewed as appropriate.  Description: The posterior dominant rhythm consists of 8-9 Hz activity of moderate voltage (25-35 uV) seen predominantly in posterior head regions, symmetric and reactive to eye opening and eye closing. Physiologic photic driving was not seen during photic stimulation.  Hyperventilation was not performed.     IMPRESSION: This study is within normal limits. No seizures or epileptiform discharges were seen throughout the recording.  A normal interictal EEG does not exclude the diagnosis of epilepsy.  Nathanael Krist O Mashelle Busick

## 2024-06-15 DIAGNOSIS — D62 Acute posthemorrhagic anemia: Secondary | ICD-10-CM | POA: Diagnosis present

## 2024-06-15 DIAGNOSIS — I350 Nonrheumatic aortic (valve) stenosis: Secondary | ICD-10-CM | POA: Diagnosis not present

## 2024-06-15 DIAGNOSIS — D689 Coagulation defect, unspecified: Secondary | ICD-10-CM | POA: Diagnosis not present

## 2024-06-15 DIAGNOSIS — I360 Nonrheumatic tricuspid (valve) stenosis: Secondary | ICD-10-CM | POA: Diagnosis not present

## 2024-06-15 DIAGNOSIS — T826XXA Infection and inflammatory reaction due to cardiac valve prosthesis, initial encounter: Secondary | ICD-10-CM | POA: Diagnosis not present

## 2024-06-15 DIAGNOSIS — A4902 Methicillin resistant Staphylococcus aureus infection, unspecified site: Secondary | ICD-10-CM | POA: Diagnosis not present

## 2024-06-15 DIAGNOSIS — I639 Cerebral infarction, unspecified: Principal | ICD-10-CM | POA: Insufficient documentation

## 2024-06-15 DIAGNOSIS — I76 Septic arterial embolism: Secondary | ICD-10-CM | POA: Diagnosis not present

## 2024-06-15 DIAGNOSIS — G40909 Epilepsy, unspecified, not intractable, without status epilepticus: Secondary | ICD-10-CM | POA: Diagnosis present

## 2024-06-15 DIAGNOSIS — I361 Nonrheumatic tricuspid (valve) insufficiency: Secondary | ICD-10-CM | POA: Diagnosis not present

## 2024-06-15 DIAGNOSIS — Z951 Presence of aortocoronary bypass graft: Secondary | ICD-10-CM | POA: Diagnosis not present

## 2024-06-15 DIAGNOSIS — I442 Atrioventricular block, complete: Secondary | ICD-10-CM | POA: Diagnosis not present

## 2024-06-15 DIAGNOSIS — I330 Acute and subacute infective endocarditis: Secondary | ICD-10-CM | POA: Diagnosis not present

## 2024-06-15 DIAGNOSIS — Z950 Presence of cardiac pacemaker: Secondary | ICD-10-CM | POA: Diagnosis not present

## 2024-06-15 DIAGNOSIS — B957 Other staphylococcus as the cause of diseases classified elsewhere: Secondary | ICD-10-CM

## 2024-06-15 DIAGNOSIS — Q2543 Congenital aneurysm of aorta: Secondary | ICD-10-CM | POA: Diagnosis not present

## 2024-06-15 DIAGNOSIS — I7121 Aneurysm of the ascending aorta, without rupture: Secondary | ICD-10-CM | POA: Diagnosis not present

## 2024-06-15 DIAGNOSIS — R079 Chest pain, unspecified: Secondary | ICD-10-CM | POA: Diagnosis not present

## 2024-06-15 DIAGNOSIS — I669 Occlusion and stenosis of unspecified cerebral artery: Secondary | ICD-10-CM | POA: Diagnosis not present

## 2024-06-15 DIAGNOSIS — I5030 Unspecified diastolic (congestive) heart failure: Secondary | ICD-10-CM | POA: Diagnosis not present

## 2024-06-15 DIAGNOSIS — I38 Endocarditis, valve unspecified: Secondary | ICD-10-CM | POA: Diagnosis not present

## 2024-06-15 DIAGNOSIS — I5032 Chronic diastolic (congestive) heart failure: Secondary | ICD-10-CM | POA: Diagnosis not present

## 2024-06-15 DIAGNOSIS — R6510 Systemic inflammatory response syndrome (SIRS) of non-infectious origin without acute organ dysfunction: Secondary | ICD-10-CM | POA: Diagnosis present

## 2024-06-15 DIAGNOSIS — Z48812 Encounter for surgical aftercare following surgery on the circulatory system: Secondary | ICD-10-CM | POA: Diagnosis not present

## 2024-06-15 DIAGNOSIS — Z452 Encounter for adjustment and management of vascular access device: Secondary | ICD-10-CM | POA: Diagnosis not present

## 2024-06-15 DIAGNOSIS — I6340 Cerebral infarction due to embolism of unspecified cerebral artery: Secondary | ICD-10-CM | POA: Diagnosis not present

## 2024-06-15 DIAGNOSIS — I2510 Atherosclerotic heart disease of native coronary artery without angina pectoris: Secondary | ICD-10-CM | POA: Diagnosis not present

## 2024-06-15 DIAGNOSIS — I2489 Other forms of acute ischemic heart disease: Secondary | ICD-10-CM | POA: Diagnosis present

## 2024-06-15 DIAGNOSIS — E872 Acidosis, unspecified: Secondary | ICD-10-CM | POA: Diagnosis present

## 2024-06-15 DIAGNOSIS — I083 Combined rheumatic disorders of mitral, aortic and tricuspid valves: Secondary | ICD-10-CM | POA: Diagnosis not present

## 2024-06-15 DIAGNOSIS — J939 Pneumothorax, unspecified: Secondary | ICD-10-CM | POA: Diagnosis not present

## 2024-06-15 DIAGNOSIS — I509 Heart failure, unspecified: Secondary | ICD-10-CM | POA: Diagnosis not present

## 2024-06-15 DIAGNOSIS — J90 Pleural effusion, not elsewhere classified: Secondary | ICD-10-CM | POA: Diagnosis not present

## 2024-06-15 DIAGNOSIS — Y712 Prosthetic and other implants, materials and accessory cardiovascular devices associated with adverse incidents: Secondary | ICD-10-CM | POA: Diagnosis present

## 2024-06-15 DIAGNOSIS — Z952 Presence of prosthetic heart valve: Secondary | ICD-10-CM | POA: Diagnosis not present

## 2024-06-15 DIAGNOSIS — R29700 NIHSS score 0: Secondary | ICD-10-CM | POA: Diagnosis not present

## 2024-06-15 DIAGNOSIS — I082 Rheumatic disorders of both aortic and tricuspid valves: Secondary | ICD-10-CM | POA: Diagnosis not present

## 2024-06-15 DIAGNOSIS — D649 Anemia, unspecified: Secondary | ICD-10-CM | POA: Diagnosis not present

## 2024-06-15 DIAGNOSIS — Z4682 Encounter for fitting and adjustment of non-vascular catheter: Secondary | ICD-10-CM | POA: Diagnosis not present

## 2024-06-15 DIAGNOSIS — E871 Hypo-osmolality and hyponatremia: Secondary | ICD-10-CM | POA: Diagnosis not present

## 2024-06-15 DIAGNOSIS — Z8673 Personal history of transient ischemic attack (TIA), and cerebral infarction without residual deficits: Secondary | ICD-10-CM | POA: Diagnosis not present

## 2024-06-15 DIAGNOSIS — J95821 Acute postprocedural respiratory failure: Secondary | ICD-10-CM | POA: Diagnosis not present

## 2024-06-15 DIAGNOSIS — N138 Other obstructive and reflux uropathy: Secondary | ICD-10-CM | POA: Diagnosis present

## 2024-06-15 DIAGNOSIS — I517 Cardiomegaly: Secondary | ICD-10-CM | POA: Diagnosis not present

## 2024-06-15 DIAGNOSIS — N990 Postprocedural (acute) (chronic) kidney failure: Secondary | ICD-10-CM | POA: Diagnosis not present

## 2024-06-15 DIAGNOSIS — I079 Rheumatic tricuspid valve disease, unspecified: Secondary | ICD-10-CM | POA: Diagnosis not present

## 2024-06-15 DIAGNOSIS — I5033 Acute on chronic diastolic (congestive) heart failure: Secondary | ICD-10-CM | POA: Diagnosis not present

## 2024-06-15 DIAGNOSIS — I110 Hypertensive heart disease with heart failure: Secondary | ICD-10-CM | POA: Diagnosis not present

## 2024-06-15 DIAGNOSIS — A419 Sepsis, unspecified organism: Secondary | ICD-10-CM | POA: Diagnosis not present

## 2024-06-15 DIAGNOSIS — I25110 Atherosclerotic heart disease of native coronary artery with unstable angina pectoris: Secondary | ICD-10-CM | POA: Diagnosis not present

## 2024-06-15 DIAGNOSIS — B9562 Methicillin resistant Staphylococcus aureus infection as the cause of diseases classified elsewhere: Secondary | ICD-10-CM | POA: Diagnosis not present

## 2024-06-15 DIAGNOSIS — J9811 Atelectasis: Secondary | ICD-10-CM | POA: Diagnosis not present

## 2024-06-15 DIAGNOSIS — I4891 Unspecified atrial fibrillation: Secondary | ICD-10-CM | POA: Diagnosis not present

## 2024-06-15 DIAGNOSIS — N179 Acute kidney failure, unspecified: Secondary | ICD-10-CM | POA: Diagnosis not present

## 2024-06-15 DIAGNOSIS — R918 Other nonspecific abnormal finding of lung field: Secondary | ICD-10-CM | POA: Diagnosis not present

## 2024-06-15 DIAGNOSIS — K5641 Fecal impaction: Secondary | ICD-10-CM | POA: Diagnosis present

## 2024-06-15 DIAGNOSIS — I452 Bifascicular block: Secondary | ICD-10-CM | POA: Diagnosis present

## 2024-06-15 DIAGNOSIS — R7881 Bacteremia: Secondary | ICD-10-CM | POA: Diagnosis not present

## 2024-06-15 DIAGNOSIS — I214 Non-ST elevation (NSTEMI) myocardial infarction: Secondary | ICD-10-CM | POA: Diagnosis not present

## 2024-06-15 DIAGNOSIS — E785 Hyperlipidemia, unspecified: Secondary | ICD-10-CM | POA: Diagnosis not present

## 2024-06-15 DIAGNOSIS — J449 Chronic obstructive pulmonary disease, unspecified: Secondary | ICD-10-CM | POA: Diagnosis present

## 2024-06-15 LAB — BASIC METABOLIC PANEL WITH GFR
Anion gap: 8 (ref 5–15)
BUN: 15 mg/dL (ref 8–23)
CO2: 26 mmol/L (ref 22–32)
Calcium: 8.3 mg/dL — ABNORMAL LOW (ref 8.9–10.3)
Chloride: 100 mmol/L (ref 98–111)
Creatinine, Ser: 0.76 mg/dL (ref 0.61–1.24)
GFR, Estimated: >60 mL/min (ref >60)
Glucose, Bld: 92 mg/dL (ref 70–99)
Potassium: 3.8 mmol/L (ref 3.5–5.1)
Sodium: 134 mmol/L — ABNORMAL LOW (ref 135–145)

## 2024-06-15 LAB — MAGNESIUM: Magnesium: 2 mg/dL (ref 1.7–2.4)

## 2024-06-15 LAB — CBC
HCT: 30.2 % — ABNORMAL LOW (ref 39.0–52.0)
Hemoglobin: 9.7 g/dL — ABNORMAL LOW (ref 13.0–17.0)
MCH: 29.3 pg (ref 26.0–34.0)
MCHC: 32.1 g/dL (ref 30.0–36.0)
MCV: 91.2 fL (ref 80.0–100.0)
Platelets: 188 10*3/uL (ref 150–400)
RBC: 3.31 MIL/uL — ABNORMAL LOW (ref 4.22–5.81)
RDW: 13.9 % (ref 11.5–15.5)
WBC: 11.3 10*3/uL — ABNORMAL HIGH (ref 4.0–10.5)
nRBC: 0 % (ref 0.0–0.2)

## 2024-06-15 NOTE — Progress Notes (Signed)
 PROGRESS NOTE  Kyle Matthews FMW:[id number] DOB: 08-01-1946 DOA: 06/12/2024 PCP: Verena Mems, MD  Brief History:  78 year old male with a history of coronary artery disease status post CABG 10/2023, AS s/p bioprosthetic AVR, diastolic dysfunction, stroke, postoperative atrial fibrillation post CABG, HTN, HLD, BPH with urinary retention status post robotic assisted laparoscopic simple prostatectomy 05/22/2024, hypertension, hyperlipidemia, presenting with midsternal chest discomfort subjective chills and rigors. Regarding the chest discomfort, the patient stated that the sensation along his mid sternum evening of 06/11/2024 while watching television.  He went to bed and woke up on the morning of 06/12/2024 with the same burning midsternal chest discomfort.  Around noon time he had an episode of chills and shaking which resulted in an episode of nausea and vomiting.  This was after he ate some cereal.  His chest discomfort continued.  As result, he presented for further evaluation and treatment. He denies any headache, neck pain coughing, hemoptysis, hematemesis, pain, dysuria, hematuria, hematochezia, melena.  Regarding his chills and shaking episodes, the patient states that he has never had a temperature greater than 98.9 Fahrenheit.  He states the episodes usually last about 15- 20 minutes.  He feels cold and wraps himself up in the blanket.  He states that he is able to somewhat suppress his shaking, but is no completely suppressed.  He does not lose consciousness.  He states his bilateral arms> bilateral legs shake.  He is able to speak and communicate during these episodes.  He states that he has had these intermittent episodes since his CABG 10/2023.  However he has had 4 episodes past 2 to 3 weeks.  He denies any headache, visual disturbance, focal extremity weakness coughing,, dysuria, hematuria.  Notably, the patient went to see his outpatient provider on 06/06/2024 he was placed on  ciprofloxacin  empirically.  The patient states that he took 6 days.  His last dose was 06/11/24.  The patient elected temperature 99.0 F.  He was initially tachycardic up to 122.  Saturation was 97% room air.  WBC 14.0, hemoglobin 10.8, platelets 177.  Sodium 134, potassium 3.7, bicarbonate 23, serum creatinine 0.70. Cardiology was consulted to assist with management.   In the ED, the patient had a low-grade temperature 1 0 F.  He was hemodynamically stable.  Oxygen saturation WBC 14.0, platelets 176.  BMP showed sodium 134, potassium 3.7, bicarbonate 23, serum creatinine 0.70 LFTs unremarkable.  BNP 169.0.  99>> 86.  EKG showed sinus rhythm with right bundle branch block.  CTA chest was negative for PE.  There is mild emphysematous change.  There is no infiltrates or effusions.   Assessment/Plan: Chest pain/elevated troponin -troponins flat 99>>86 -cardiology consult appreciated -echo-EF 55-60%, normal RVF, mod-severe TR, mild MR--mean gradient is increased (12 to 28 mm Hg) and  DVI is decreased (0.55 to 0.41) Cannot exclude obstruction to prosthesis.  -continue aspirin  and statin   SIRS/Chills/Rigors/Shaking -Patient presented with leukocytosis and tachycardia -etiology unclear - EEG--no seizure - 6/25 blood culture--Staph epi 2/2 sets - Check lactic acid 1.1 - Check PCT <0.10 - The patient has been on ciprofloxacin  which may compromise cultures - random cortisol--12.4 - MR brain--Small acute infarct in the left occipital lobe  - TSH 0.937 - d/c antibiotics and monitor -urine culture < 10 K organisms   Staph Epi Bacteremia -- 6/25 blood culture--Staph epi 2/2 sets -repeat blood culture 6/27--GPC 2/2 sets -started empiric vanc 6/27>> -wait for final speciation -repeat blood cultures in  am 6/29   Acute Ischemic Stroke -Appreciate Neurology Consult -PT/OT evaluation--no PT follow up -Speech therapy eval -MRI brain--Small acute infarct in the left occipital lobe  -MRA  brain--No emergent large vessel occlusion or proximal hemodynamically significant stenosis -CTA neck--no LVO; patent carotids and vertebral arteries -Echo--EF 55-60%, normal RVF, mod-severe TR, mild MR--mean gradient is increased (12 to 28 mm Hg) and  DVI is decreased (0.55 to 0.41) Cannot exclude obstruction to prosthesis.  -LDL--66 -HbA1C--5.1 -Antiplatelet--Aspirin  81 mg daily -06/14/24--discussed with Dr. Shelton   Hematuria -started after IV heparin  (6/25-6/26) -stopped IV heparin  after about 18 hours -discussed with urology, Dr. Watt -expects bleeding to stop if related to IV heparin  -IV heparin  stopped after discussing with Dr. Alvan -monitor for now as long as pt able to urinate   BPH -s/p laparoscopic prostatectomy 05/22/2024 - Foley catheter was removed 06/03/2024 - Able to urinate without difficulty - continue finasteride    Mixed Hyperlipidemia -continue statin   Elevated D-dimer -CTA chest neg for PE -venous duplex legs   Essential HTN -hold amlodipine  temporarily   Aortic Stenosis -s/p bioprosthetic valve -discussed with Dr. Jessie echo -plan for TEE 6/30 if able                     Family Communication:   spouse 6/28   Consultants:  cardiology   Code Status:  FULL    DVT Prophylaxis:  SCDs     Procedures: As Listed in Progress Note Above   Antibiotics: Vanc 6/27>>        Subjective: Pt still having hematuria.  Denies f/c, cp, sob, n/v/d  Objective: Vitals:   06/14/24 2002 06/14/24 2039 06/15/24 0630 06/15/24 1300  BP: 109/63 109/63 111/74 124/70  Pulse: 79 79 73 74  Resp: 17     Temp: 98.5 F (36.9 C) 98.5 F (36.9 C) 97.7 F (36.5 C) 97.7 F (36.5 C)  TempSrc: Oral Oral Oral Oral  SpO2: 98% 98% 99% 99%  Weight:      Height:        Intake/Output Summary (Last 24 hours) at 06/15/2024 1700 Last data filed at 06/15/2024 1500 Gross per 24 hour  Intake 972.61 ml  Output --  Net 972.61 ml   Weight change:   Exam:  General:  Pt is alert, follows commands appropriately, not in acute distress HEENT: No icterus, No thrush, No neck mass, [place]/AT Cardiovascular: RRR, S1/S2, no rubs, no gallops Respiratory: CTA bilaterally, no wheezing, no crackles, no rhonchi Abdomen: Soft/+BS, non tender, non distended, no guarding Extremities: No edema, No lymphangitis, No petechiae, No rashes, no synovitis   Data Reviewed: I have personally reviewed following labs and imaging studies Basic Metabolic Panel: Recent Labs  Lab 06/12/24 1819 06/13/24 0454 06/13/24 0937 06/14/24 0426 06/15/24 0306  NA 134* 135  --  132* 134*  K 3.7 3.1*  --  3.8 3.8  CL 101 100  --  99 100  CO2 23 25  --  24 26  GLUCOSE 121* 105*  --  114* 92  BUN 15 12  --  11 15  CREATININE 0.70 0.64  --  0.75 0.76  CALCIUM  8.7* 8.6*  --  8.4* 8.3*  MG  --   --  1.9 1.9 2.0   Liver Function Tests: Recent Labs  Lab 06/12/24 1819  AST 19  ALT 12  ALKPHOS 64  BILITOT 0.8  PROT 6.5  ALBUMIN  3.1*   No results for input(s): LIPASE, AMYLASE in the last  168 hours. No results for input(s): AMMONIA in the last 168 hours. Coagulation Profile: Recent Labs  Lab 06/12/24 1819  INR 1.1   CBC: Recent Labs  Lab 06/12/24 1819 06/13/24 0454 06/14/24 0426 06/15/24 0306  WBC 14.0* 14.0* 14.2* 11.3*  NEUTROABS 11.8*  --   --   --   HGB 10.8* 10.8* 10.5* 9.7*  HCT 32.8* 32.5* 31.0* 30.2*  MCV 89.1 89.0 90.9 91.2  PLT 176 177 207 188   Cardiac Enzymes: Recent Labs  Lab 06/13/24 0937  CKTOTAL 12*   BNP: Invalid input(s): POCBNP CBG: No results for input(s): GLUCAP in the last 168 hours. HbA1C: Recent Labs    06/13/24 0454  HGBA1C 5.1   Urine analysis:    Component Value Date/Time   COLORURINE YELLOW 11/02/2023 0057   APPEARANCEUR CLEAR 11/02/2023 0057   LABSPEC 1.008 11/02/2023 0057   PHURINE 7.0 11/02/2023 0057   GLUCOSEU NEGATIVE 11/02/2023 0057   HGBUR MODERATE (A) 11/02/2023 0057   BILIRUBINUR  NEGATIVE 11/02/2023 0057   KETONESUR NEGATIVE 11/02/2023 0057   PROTEINUR NEGATIVE 11/02/2023 0057   NITRITE NEGATIVE 11/02/2023 0057   LEUKOCYTESUR MODERATE (A) 11/02/2023 0057   Sepsis Labs: @LABRCNTIP (procalcitonin:4,lacticidven:4) ) Recent Results (from the past 240 hours)  Culture, blood (Routine X 2) w Reflex to ID Panel     Status: Abnormal (Preliminary result)   Collection Time: 06/12/24 10:55 PM   Specimen: BLOOD  Result Value Ref Range Status   Specimen Description   Final    BLOOD BLOOD LEFT ARM Performed at [facility], [address] [place], [place] [address]    Special Requests   Final    BOTTLES DRAWN AEROBIC AND ANAEROBIC Blood Culture adequate volume Performed at [facility], [address], [place], [place] [address]    Culture  Setup Time   Final    GRAM POSITIVE COCCI IN BOTH AEROBIC AND ANAEROBIC BOTTLES CRITICAL VALUE NOTED.  VALUE IS CONSISTENT WITH PREVIOUSLY REPORTED AND CALLED VALUE. JESSICA ELLER AT 2315 06/13/24 BY A. SNYDER GRAM STAIN REVIEWED-AGREE WITH RESULT Performed at [facility], [address], [place], [place] [address]    Culture STAPHYLOCOCCUS EPIDERMIDIS (A)  Final   Report Status PENDING  Incomplete  Culture, blood (Routine X 2) w Reflex to ID Panel     Status: Abnormal (Preliminary result)   Collection Time: 06/12/24 10:57 PM   Specimen: BLOOD  Result Value Ref Range Status   Specimen Description   Final    BLOOD BLOOD LEFT HAND Performed at [facility], [address], [place], [place] [address]    Special Requests   Final    BOTTLES DRAWN AEROBIC AND ANAEROBIC Blood Culture adequate volume Performed at [facility], [address], [place], [place] [address]    Culture  Setup Time   Final    GRAM POSITIVE COCCI ANAEROBIC BOTTLE ONLY Gram Stain Report Called to,Read Back By and Verified With: JESSICA ELLER,RN AT 2315 06/13/24 BY A. SNYDER CRITICAL RESULT CALLED TO, READ BACK BY AND VERIFIED WITH: N BAUGH RN 06/14/2024  @ 0414 BY AB    Culture (A)  Final    STAPHYLOCOCCUS EPIDERMIDIS CULTURE REINCUBATED FOR BETTER GROWTH Performed at [facility], [address] [place], [place] [address]    Report Status PENDING  Incomplete  Blood Culture ID Panel (Reflexed)     Status: Abnormal   Collection Time: 06/12/24 10:57 PM  Result Value Ref Range Status   Enterococcus faecalis NOT DETECTED NOT  DETECTED Final   Enterococcus Faecium NOT DETECTED NOT DETECTED Final   Listeria monocytogenes NOT DETECTED NOT DETECTED Final   Staphylococcus species DETECTED (A) NOT DETECTED Final    Comment: CRITICAL RESULT CALLED TO, READ BACK BY AND VERIFIED WITH: N BAUGH RN 06/14/2024 @ 0414 BY AB    Staphylococcus aureus (BCID) NOT DETECTED NOT DETECTED Final   Staphylococcus epidermidis DETECTED (A) NOT DETECTED Final    Comment: Methicillin (oxacillin) resistant coagulase negative staphylococcus. Possible blood culture contaminant (unless isolated from more than one blood culture draw or clinical case suggests pathogenicity). No antibiotic treatment is indicated for blood  culture contaminants. CRITICAL RESULT CALLED TO, READ BACK BY AND VERIFIED WITH: N BAUGH RN 06/14/2024 @ 0414 BY AB    Staphylococcus lugdunensis NOT DETECTED NOT DETECTED Final   Streptococcus species NOT DETECTED NOT DETECTED Final   Streptococcus agalactiae NOT DETECTED NOT DETECTED Final   Streptococcus pneumoniae NOT DETECTED NOT DETECTED Final   Streptococcus pyogenes NOT DETECTED NOT DETECTED Final   A.calcoaceticus-baumannii NOT DETECTED NOT DETECTED Final   Bacteroides fragilis NOT DETECTED NOT DETECTED Final   Enterobacterales NOT DETECTED NOT DETECTED Final   Enterobacter cloacae complex NOT DETECTED NOT DETECTED Final   Escherichia coli NOT DETECTED NOT DETECTED Final   Klebsiella aerogenes NOT DETECTED NOT DETECTED Final   Klebsiella oxytoca NOT DETECTED NOT DETECTED Final   Klebsiella pneumoniae NOT DETECTED NOT DETECTED Final    Proteus species NOT DETECTED NOT DETECTED Final   Salmonella species NOT DETECTED NOT DETECTED Final   Serratia marcescens NOT DETECTED NOT DETECTED Final   Haemophilus influenzae NOT DETECTED NOT DETECTED Final   Neisseria meningitidis NOT DETECTED NOT DETECTED Final   Pseudomonas aeruginosa NOT DETECTED NOT DETECTED Final   Stenotrophomonas maltophilia NOT DETECTED NOT DETECTED Final   Candida albicans NOT DETECTED NOT DETECTED Final   Candida auris NOT DETECTED NOT DETECTED Final   Candida glabrata NOT DETECTED NOT DETECTED Final   Candida krusei NOT DETECTED NOT DETECTED Final   Candida parapsilosis NOT DETECTED NOT DETECTED Final   Candida tropicalis NOT DETECTED NOT DETECTED Final   Cryptococcus neoformans/gattii NOT DETECTED NOT DETECTED Final   Methicillin resistance mecA/C DETECTED (A) NOT DETECTED Final    Comment: CRITICAL RESULT CALLED TO, READ BACK BY AND VERIFIED WITHBETHA LOISE MORONES RN 06/14/2024 @ 0414 BY AB Performed at [facility], [address] [place], [place] [address]   Urine Culture     Status: Abnormal   Collection Time: 06/13/24  1:20 PM   Specimen: Urine, Clean Catch  Result Value Ref Range Status   Specimen Description   Final    URINE, CLEAN CATCH Performed at [facility], [address], [place], [place] [address]    Special Requests   Final    NONE Performed at [facility], [address], [place], [place] [address]    Culture (A)  Final    <10,000 COLONIES/mL INSIGNIFICANT GROWTH Performed at [facility], [address] [place], [place] [address]    Report Status 06/14/2024 FINAL  Final  Culture, blood (Routine X 2) w Reflex to ID Panel     Status: None (Preliminary result)   Collection Time: 06/14/24  7:44 AM   Specimen: BLOOD  Result Value Ref Range Status   Specimen Description BLOOD RIGHT ANTECUBITAL  Final   Special Requests   Final    BOTTLES DRAWN AEROBIC AND ANAEROBIC Blood Culture adequate volume   Culture  Setup Time  Final     GRAM POSITIVE COCCI IN BOTH AEROBIC AND ANAEROBIC BOTTLES Gram Stain Report Called to,Read Back By and Verified With: D. JOSHUA ON 06/15/2024 @12 :50 BY T.HAMER Performed at [facility], [address] [place], [place] [address]    Culture [facility] POSITIVE COCCI  Final   Report Status PENDING  Incomplete  Culture, blood (Routine X 2) w Reflex to ID Panel     Status: None (Preliminary result)   Collection Time: 06/14/24  7:44 AM   Specimen: BLOOD  Result Value Ref Range Status   Specimen Description BLOOD LEFT ANTECUBITAL  Final   Special Requests   Final    BOTTLES DRAWN AEROBIC AND ANAEROBIC Blood Culture adequate volume   Culture  Setup Time   Final    GRAM POSITIVE COCCI IN BOTH AEROBIC AND ANAEROBIC BOTTLES Gram Stain Report Called to,Read Back By and Verified With: B. ROBERTSON ON 06/15/2024 @13 :49 BY T.HAMER Performed at [facility], [address], [place], [place] [address]    Culture [facility] POSITIVE COCCI  Final   Report Status PENDING  Incomplete     Scheduled Meds:  aspirin   81 mg Oral Daily   finasteride   5 mg Oral QPM   pantoprazole  (PROTONIX ) IV  40 mg Intravenous Q24H   rosuvastatin   40 mg Oral Daily   Continuous Infusions:  vancomycin  1,250 mg (06/15/24 0943)    Procedures/Studies: ECHOCARDIOGRAM COMPLETE Result Date: 06/14/2024    ECHOCARDIOGRAM REPORT   Patient Name:   Kyle Matthews Date of Exam: 06/13/2024 Medical Rec #:  [id number]  Height:       62.0 in Accession #:    [id number] Weight:       153.2 lb Date of Birth:  1946-10-08   BSA:          1.707 m Patient Age:    77 years   BP:           118/71 mmHg Patient Gender: M          HR:           88 bpm. Exam Location:  [facility] Procedure: 2D Echo, Cardiac Doppler and Color Doppler (Both Spectral and Color            Flow Doppler were utilized during procedure). Indications:    Chest Pain  History:        Patient has prior history of Echocardiogram examinations. Risk                 Factors:Hypertension.  Sonographer:     Vella Key Referring Phys: [id number] EJIROGHENE E EMOKPAE IMPRESSIONS  1. Left ventricular ejection fraction, by estimation, is 55 to 60%. The left ventricle has normal function. There is mild concentric left ventricular hypertrophy. Indeterminate diastolic filling due to E-A fusion.  2. Right ventricular systolic function is normal. The right ventricular size is normal.  3. Right atrial size was moderately dilated.  4. Mild mitral valve regurgitation.  5. Tricuspid valve regurgitation is moderate to severe.  6. S/p AVR (23 mm Inspiris Resilia prosthesis; procedure date 11/02/23). Peak and mean gradients through the valve are 46 and 28 mm Hg respectively Acceleration time is 89 msec Ejection time is 209 msec (AT/ET is 0.39) DVI is 0.39. Compared to echo report from December 2024, mean gradient is increased (12 to 28 mm Hg) and DVI is decreased (0.55 to 0.41) Cannot exclude obstruction to prosthesis.     Consider TEE to evaluate furtherr.. The aortic valve has  been repaired/replaced. Aortic valve regurgitation is not visualized. Aortic valve sclerosis/calcification is present, without any evidence of aortic stenosis. FINDINGS  Left Ventricle: Left ventricular ejection fraction, by estimation, is 55 to 60%. The left ventricle has normal function. The left ventricular internal cavity size was normal in size. There is mild concentric left ventricular hypertrophy. Indeterminate diastolic filling due to E-A fusion. Right Ventricle: The right ventricular size is normal. Right vetricular wall thickness was not assessed. Right ventricular systolic function is normal. Left Atrium: Left atrial size was normal in size. Right Atrium: Right atrial size was moderately dilated. Pericardium: There is no evidence of pericardial effusion. Mitral Valve: There is mild thickening of the mitral valve leaflet(s). Mild mitral annular calcification. Mild mitral valve regurgitation. Tricuspid Valve: The tricuspid valve is normal in structure.  Tricuspid valve regurgitation is moderate to severe. Aortic Valve: S/p AVR (23 mm Inspiris Resilia prosthesis; procedure date 11/02/23). Peak and mean gradients through the valve are 46 and 28 mm Hg respectively Acceleration time is 89 msec Ejection time is 209 msec (AT/ET is 0.39) DVI is 0.39. Compared to  echo report from December 2024, mean gradient is increased (12 to 28 mm Hg) and DVI is decreased (0.55 to 0.41) Cannot exclude obstruction to prosthesis. Consider TEE to evaluate furtherr. The aortic valve has been repaired/replaced. Aortic valve regurgitation is not visualized. Aortic valve sclerosis/calcification is present, without any evidence of aortic stenosis. Aortic valve mean gradient measures 23.5 mmHg. Aortic valve peak gradient measures 37.6 mmHg. Aortic valve area, by VTI measures 0.93 cm. Pulmonic Valve: The pulmonic valve was not well visualized. Pulmonic valve regurgitation is not visualized. Aorta: The aortic root is normal in size and structure. IAS/Shunts: No atrial level shunt detected by color flow Doppler.  LEFT VENTRICLE PLAX 2D LVIDd:         4.00 cm      Diastology LVIDs:         2.90 cm      LV e' medial:    7.40 cm/s LV PW:         1.20 cm      LV E/e' medial:  12.5 LV IVS:        1.20 cm      LV e' lateral:   9.36 cm/s LVOT diam:     1.70 cm      LV E/e' lateral: 9.9 LV SV:         49 LV SV Index:   29 LVOT Area:     2.27 cm  LV Volumes (MOD) LV vol d, MOD A2C: 104.0 ml LV vol d, MOD A4C: 154.0 ml LV vol s, MOD A2C: 52.1 ml LV vol s, MOD A4C: 69.7 ml LV SV MOD A2C:     51.9 ml LV SV MOD A4C:     154.0 ml LV SV MOD BP:      65.7 ml RIGHT VENTRICLE RV Basal diam:  5.00 cm RV S prime:     10.80 cm/s TAPSE (M-mode): 1.3 cm LEFT ATRIUM           Index        RIGHT ATRIUM           Index LA diam:      3.70 cm 2.17 cm/m   RA Area:     28.10 cm LA Vol (A2C): 94.7 ml 55.48 ml/m  RA Volume:   113.00 ml 66.20 ml/m LA Vol (A4C): 47.4 ml 27.77 ml/m  AORTIC VALVE AV Area (  Vmax):    0.84 cm  AV Area (Vmean):   0.81 cm AV Area (VTI):     0.93 cm AV Vmax:           306.50 cm/s AV Vmean:          230.500 cm/s AV VTI:            0.528 m AV Peak Grad:      37.6 mmHg AV Mean Grad:      23.5 mmHg LVOT Vmax:         114.00 cm/s LVOT Vmean:        82.200 cm/s LVOT VTI:          0.217 m LVOT/AV VTI ratio: 0.41  AORTA Ao Root diam: 2.90 cm MITRAL VALVE                TRICUSPID VALVE MV Area (PHT): 8.25 cm     TV Peak grad:   24.0 mmHg MV Decel Time: 92 msec      TV Vmax:        2.45 m/s MV E velocity: 92.20 cm/s   TR Peak grad:   24.0 mmHg MV A velocity: 132.00 cm/s  TR Vmax:        245.00 cm/s MV E/A ratio:  0.70                             SHUNTS                             Systemic VTI:  0.22 m                             Systemic Diam: 1.70 cm Vina Gull MD Electronically signed by Vina Gull MD Signature Date/Time: 06/14/2024/11:51:30 AM    Final    EEG adult Result Date: 06/14/2024 Shelton Arlin KIDD, MD     06/14/2024  7:41 AM Patient Name: Kyle Matthews MRN: [id number] Epilepsy Attending: Arlin KIDD Shelton Referring Physician/Provider: Evonnie Lenis, MD Date: 06/13/2024 Duration: 23.04 mins Patient history: 78yo M with episodes of shaking. EEG to evaluate for seizure. Level of alertness: Awake AEDs during EEG study: None Technical aspects: This EEG study was done with scalp electrodes positioned according to the 10-20 International system of electrode placement. Electrical activity was reviewed with band pass filter of 1-70Hz , sensitivity of 7 uV/mm, display speed of 80mm/sec with a 60Hz  notched filter applied as appropriate. EEG data were recorded continuously and digitally stored.  Video monitoring was available and reviewed as appropriate. Description: The posterior dominant rhythm consists of 8-9 Hz activity of moderate voltage (25-35 uV) seen predominantly in posterior head regions, symmetric and reactive to eye opening and eye closing. Physiologic photic driving was not seen during photic stimulation.   Hyperventilation was not performed.   IMPRESSION: This study is within normal limits. No seizures or epileptiform discharges were seen throughout the recording. A normal interictal EEG does not exclude the diagnosis of epilepsy. Priyanka KIDD Shelton   CT ANGIO NECK W OR WO CONTRAST Result Date: 06/13/2024 CLINICAL DATA:  Follow-up examination for stroke. EXAM: CT ANGIOGRAPHY NECK TECHNIQUE: Multidetector CT imaging of the neck was performed using the standard protocol during bolus administration of intravenous contrast. Multiplanar CT image reconstructions and MIPs were obtained to evaluate the vascular anatomy. Carotid stenosis measurements (when applicable) are  obtained utilizing NASCET criteria, using the distal internal carotid diameter as the denominator. RADIATION DOSE REDUCTION: This exam was performed according to the departmental dose-optimization program which includes automated exposure control, adjustment of the mA and/or kV according to patient size and/or use of iterative reconstruction technique. CONTRAST:  75mL OMNIPAQUE  IOHEXOL  350 MG/ML SOLN COMPARISON:  Prior brain MRI from earlier the same day. FINDINGS: Aortic arch: Visualized aortic arch within normal limits for caliber with standard 3 vessel morphology. Moderate aortic atherosclerosis. No high-grade stenosis about the origin the great vessels. Right carotid system: Right common and internal carotid arteries are patent without dissection. Moderate calcified plaque about the right carotid bulb/proximal right ICA without hemodynamically significant greater than 50% stenosis. Left carotid system: Left common and internal carotid arteries are patent without dissection. Moderate calcified plaque about the left carotid bulb/proximal cervical left ICA without hemodynamically significant greater than 50% stenosis. Vertebral arteries: Both vertebral arteries arise from subclavian arteries. No significant proximal subclavian artery stenosis. Vertebral  arteries M cells are patent without significant stenosis or dissection. Skeleton: No worrisome osseous lesions.  Prior sternotomy noted. Other neck: No other acute finding. Few small thyroid  nodules noted, largest of which measures 8 mm on the right. These are of doubtful significance given size and patient age, no follow-up imaging recommended (ref: J Am Coll Radiol. 2015 Feb;12(2): 143-50). Upper chest: Upper lobe predominant emphysema.  No acute finding. IMPRESSION: 1. Negative CTA for large vessel occlusion. 2. Moderate atherosclerotic change about the carotid bifurcations/proximal ICAs without hemodynamically significant greater than 50% stenosis. 3. Wide patency of both vertebral arteries within the neck. 4. Aortic Atherosclerosis (ICD10-I70.0) and Emphysema (ICD10-J43.9). Electronically Signed   By: Morene Hoard M.D.   On: 06/13/2024 22:56   MR BRAIN WO CONTRAST Result Date: 06/13/2024 CLINICAL DATA:  seizure like activity EXAM: MRI HEAD WITHOUT CONTRAST MRA HEAD WITHOUT CONTRAST TECHNIQUE: Multiplanar, multi-echo pulse sequences of the brain and surrounding structures were acquired without intravenous contrast. Angiographic images of the Circle of Willis were acquired using MRA technique without intravenous contrast. COMPARISON:  CT head 15 2007. 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 No significant mass effect or midline shift. Additional patchy T2/FLAIR hyperintensity in the white matter compatible with microvascular ischemic disease. No evidence of acute hemorrhage, mass lesion, or hydrocephalus. Vascular: See below. Skull and upper cervical spine: Normal marrow signal. Sinuses/Orbits: Mostly clear sinuses.  No acute orbital findings. Other: No mastoid effusions. MRA HEAD FINDINGS Anterior circulation: Bilateral intracranial ICAs, MCAs, and ACAs are patent without proximal hemodynamically significant stenosis. Posterior circulation:Bilateral  intradural vertebral arteries, basilar artery and bilateral posterior cerebral arteries are patent without proximally in amic least significant stenosis. IMPRESSION: 1. 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 2. No emergent large vessel occlusion or proximal hemodynamically significant stenosis. Electronically Signed   By: Gilmore GORMAN Molt M.D.   On: 06/13/2024 16:11   MR ANGIO HEAD WO CONTRAST Result Date: 06/13/2024 CLINICAL DATA:  seizure like activity EXAM: MRI HEAD WITHOUT CONTRAST MRA HEAD WITHOUT CONTRAST TECHNIQUE: Multiplanar, multi-echo pulse sequences of the brain and surrounding structures were acquired without intravenous contrast. Angiographic images of the Circle of Willis were acquired using MRA technique without intravenous contrast. COMPARISON:  CT head 15 2007. 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 No significant mass effect or midline shift. Additional patchy T2/FLAIR hyperintensity in the white matter compatible with microvascular ischemic disease. No evidence of acute hemorrhage, mass  lesion, or hydrocephalus. Vascular: See below. Skull and upper cervical spine: Normal marrow signal. Sinuses/Orbits: Mostly clear sinuses.  No acute orbital findings. Other: No mastoid effusions. MRA HEAD FINDINGS Anterior circulation: Bilateral intracranial ICAs, MCAs, and ACAs are patent without proximal hemodynamically significant stenosis. Posterior circulation:Bilateral intradural vertebral arteries, basilar artery and bilateral posterior cerebral arteries are patent without proximally in amic least significant stenosis. IMPRESSION: 1. 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 2. No emergent large vessel occlusion or proximal hemodynamically significant stenosis. Electronically Signed   By: Gilmore GORMAN Molt M.D.   On: 06/13/2024 16:11   US  Venous Img Lower Bilateral Result Date: 06/13/2024 CLINICAL DATA:  Bilateral leg swelling. EXAM:  BILATERAL LOWER EXTREMITY VENOUS DOPPLER ULTRASOUND TECHNIQUE: Gray-scale sonography with graded compression, as well as color Doppler and duplex ultrasound were performed to evaluate the lower extremity deep venous systems from the level of the common femoral vein and including the common femoral, femoral, profunda femoral, popliteal and calf veins including the posterior tibial, peroneal and gastrocnemius veins when visible. The superficial great saphenous vein was also interrogated. Spectral Doppler was utilized to evaluate flow at rest and with distal augmentation maneuvers in the common femoral, femoral and popliteal veins. COMPARISON:  None Available. FINDINGS: RIGHT LOWER EXTREMITY Common Femoral Vein: No evidence of thrombus. Normal compressibility, respiratory phasicity and response to augmentation. Saphenofemoral Junction: No evidence of thrombus. Normal compressibility and flow on color Doppler imaging. Profunda Femoral Vein: No evidence of thrombus. Normal compressibility and flow on color Doppler imaging. Femoral Vein: No evidence of thrombus. Normal compressibility, respiratory phasicity and response to augmentation. Popliteal Vein: No evidence of thrombus. Normal compressibility, respiratory phasicity and response to augmentation. Calf Veins: No evidence of thrombus. Normal compressibility and flow on color Doppler imaging. Superficial Great Saphenous Vein: No evidence of thrombus. Normal compressibility. Other Findings:  None. LEFT LOWER EXTREMITY Common Femoral Vein: No evidence of thrombus. Normal compressibility, respiratory phasicity and response to augmentation. Saphenofemoral Junction: No evidence of thrombus. Normal compressibility and flow on color Doppler imaging. Profunda Femoral Vein: No evidence of thrombus. Normal compressibility and flow on color Doppler imaging. Femoral Vein: No evidence of thrombus. Normal compressibility, respiratory phasicity and response to augmentation. Popliteal  Vein: No evidence of thrombus. Normal compressibility, respiratory phasicity and response to augmentation. Calf Veins: No evidence of thrombus. Normal compressibility and flow on color Doppler imaging. Superficial Great Saphenous Vein: No evidence of thrombus. Normal compressibility. Other Findings:  None. IMPRESSION: No evidence of deep venous thrombosis in either lower extremity. Electronically Signed   By: Juliene Balder M.D.   On: 06/13/2024 10:52   CT Angio Chest PE W and/or Wo Contrast Result Date: 06/12/2024 CLINICAL DATA:  Positive D-dimer and chest pain, initial encounter EXAM: CT ANGIOGRAPHY CHEST WITH CONTRAST TECHNIQUE: Multidetector CT imaging of the chest was performed using the standard protocol during bolus administration of intravenous contrast. Multiplanar CT image reconstructions and MIPs were obtained to evaluate the vascular anatomy. RADIATION DOSE REDUCTION: This exam was performed according to the departmental dose-optimization program which includes automated exposure control, adjustment of the mA and/or kV according to patient size and/or use of iterative reconstruction technique. CONTRAST:  75mL OMNIPAQUE  IOHEXOL  350 MG/ML SOLN COMPARISON:  Chest x-ray from earlier in the same day. CT from 10/10/2023 FINDINGS: Cardiovascular: Atherosclerotic calcifications of the aorta are noted. No aneurysmal dilatation is seen. 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 Coronary calcifications are noted. The pulmonary artery shows a normal branching pattern bilaterally. No filling defect  to suggest pulmonary embolism is noted. Mediastinum/Nodes: Thoracic inlet is within normal limits. No hilar or mediastinal adenopathy is noted. The esophagus as visualized is within normal limits. Lungs/Pleura: Lungs are well aerated bilaterally. Mild emphysematous changes are seen. Parenchymal nodule is seen. No infiltrate or effusion is noted. Upper Abdomen: Visualized upper abdomen demonstrates stable simple  cyst within the right lobe of the liver. Musculoskeletal: Degenerative changes of the thoracic spine are seen. No acute bony abnormality is noted. Review of the MIP images confirms the above findings. IMPRESSION: No evidence of pulmonary embolism. No acute abnormality seen. Aortic Atherosclerosis (ICD10-I70.0) and Emphysema (ICD10-J43.9). Electronically Signed   By: Oneil Devonshire M.D.   On: 06/12/2024 19:24   DG Chest Port 1 View Result Date: 06/12/2024 CLINICAL DATA:  Chest pain EXAM: PORTABLE CHEST 1 VIEW COMPARISON:  05/07/2024 FINDINGS: Prior CABG and valve replacement. Heart and mediastinal contours are within normal limits. No focal opacities or effusions. No acute bony abnormality. Aortic atherosclerosis. IMPRESSION: No active cardiopulmonary disease. Electronically Signed   By: Franky Crease M.D.   On: 06/12/2024 18:05    Alm Schneider, DO  [facility]  If 7PM-7AM, please contact night-coverage [email or web address] Password [email or web address] 06/15/2024, 5:00 PM   LOS: 0 days

## 2024-06-15 NOTE — Plan of Care (Signed)
  Problem: Pain Management: Goal: General experience of comfort will improve Outcome: Progressing

## 2024-06-15 NOTE — Plan of Care (Signed)
  Problem: Education: Goal: Knowledge of the procedure and recovery process will improve Outcome: Progressing   Problem: Bowel/Gastric: Goal: Gastrointestinal status for postoperative course will improve Outcome: Progressing   Problem: Pain Management: Goal: General experience of comfort will improve Outcome: Progressing   Problem: Skin Integrity: Goal: Demonstration of wound healing without infection will improve Outcome: Progressing   Problem: Urinary Elimination: Goal: Ability to avoid or minimize complications of infection will improve Outcome: Progressing Goal: Ability to achieve and maintain urine output will improve Outcome: Progressing Goal: Home care management will improve Outcome: Progressing   Problem: Education: Goal: Knowledge of General Education information will improve Description: Including pain rating scale, medication(s)/side effects and non-pharmacologic comfort measures Outcome: Progressing   Problem: Health Behavior/Discharge Planning: Goal: Ability to manage health-related needs will improve Outcome: Progressing   Problem: Clinical Measurements: Goal: Ability to maintain clinical measurements within normal limits will improve Outcome: Progressing Goal: Will remain free from infection Outcome: Progressing Goal: Diagnostic test results will improve Outcome: Progressing Goal: Respiratory complications will improve Outcome: Progressing Goal: Cardiovascular complication will be avoided Outcome: Progressing   Problem: Activity: Goal: Risk for activity intolerance will decrease Outcome: Progressing   Problem: Nutrition: Goal: Adequate nutrition will be maintained Outcome: Progressing   Problem: Coping: Goal: Level of anxiety will decrease Outcome: Progressing   Problem: Elimination: Goal: Will not experience complications related to bowel motility Outcome: Progressing Goal: Will not experience complications related to urinary  retention Outcome: Progressing   Problem: Pain Managment: Goal: General experience of comfort will improve and/or be controlled Outcome: Progressing   Problem: Safety: Goal: Ability to remain free from injury will improve Outcome: Progressing   Problem: Skin Integrity: Goal: Risk for impaired skin integrity will decrease Outcome: Progressing   Problem: Education: Goal: Knowledge of disease or condition will improve Outcome: Progressing Goal: Knowledge of secondary prevention will improve (MUST DOCUMENT ALL) Outcome: Progressing Goal: Knowledge of patient specific risk factors will improve (DELETE if not current risk factor) Outcome: Progressing   Problem: Ischemic Stroke/TIA Tissue Perfusion: Goal: Complications of ischemic stroke/TIA will be minimized Outcome: Progressing   Problem: Coping: Goal: Will verbalize positive feelings about self Outcome: Progressing Goal: Will identify appropriate support needs Outcome: Progressing   Problem: Health Behavior/Discharge Planning: Goal: Ability to manage health-related needs will improve Outcome: Progressing Goal: Goals will be collaboratively established with patient/family Outcome: Progressing   Problem: Self-Care: Goal: Ability to participate in self-care as condition permits will improve Outcome: Progressing Goal: Verbalization of feelings and concerns over difficulty with self-care will improve Outcome: Progressing Goal: Ability to communicate needs accurately will improve Outcome: Progressing   Problem: Nutrition: Goal: Risk of aspiration will decrease Outcome: Progressing Goal: Dietary intake will improve Outcome: Progressing

## 2024-06-16 DIAGNOSIS — I639 Cerebral infarction, unspecified: Secondary | ICD-10-CM | POA: Diagnosis not present

## 2024-06-16 DIAGNOSIS — R6510 Systemic inflammatory response syndrome (SIRS) of non-infectious origin without acute organ dysfunction: Secondary | ICD-10-CM | POA: Diagnosis not present

## 2024-06-16 DIAGNOSIS — R7881 Bacteremia: Secondary | ICD-10-CM | POA: Diagnosis not present

## 2024-06-16 DIAGNOSIS — I5032 Chronic diastolic (congestive) heart failure: Secondary | ICD-10-CM | POA: Diagnosis not present

## 2024-06-16 DIAGNOSIS — R079 Chest pain, unspecified: Secondary | ICD-10-CM | POA: Diagnosis not present

## 2024-06-16 LAB — CBC
HCT: 35 % — ABNORMAL LOW (ref 39.0–52.0)
Hemoglobin: 11.3 g/dL — ABNORMAL LOW (ref 13.0–17.0)
MCH: 29.7 pg (ref 26.0–34.0)
MCHC: 32.3 g/dL (ref 30.0–36.0)
MCV: 91.9 fL (ref 80.0–100.0)
Platelets: 229 10*3/uL (ref 150–400)
RBC: 3.81 MIL/uL — ABNORMAL LOW (ref 4.22–5.81)
RDW: 13.6 % (ref 11.5–15.5)
WBC: 14 10*3/uL — ABNORMAL HIGH (ref 4.0–10.5)
nRBC: 0 % (ref 0.0–0.2)

## 2024-06-16 LAB — VANCOMYCIN, PEAK: Vancomycin Pk: 29 ug/mL — ABNORMAL LOW (ref 30–40)

## 2024-06-16 NOTE — Progress Notes (Signed)
 No C/O chest pain during night

## 2024-06-16 NOTE — Progress Notes (Signed)
 PROGRESS NOTE  Kyle Matthews FMW:[id number] DOB: Apr 30, 1946 DOA: 06/12/2024 PCP: Verena Mems, MD  Brief History:  78 year old male with a history of coronary artery disease status post CABG 10/2023, AS s/p bioprosthetic AVR, diastolic dysfunction, stroke, postoperative atrial fibrillation post CABG, HTN, HLD, BPH with urinary retention status post robotic assisted laparoscopic simple prostatectomy 05/22/2024, hypertension, hyperlipidemia, presenting with midsternal chest discomfort subjective chills and rigors. Regarding the chest discomfort, the patient stated that the sensation along his mid sternum evening of 06/11/2024 while watching television.  He went to bed and woke up on the morning of 06/12/2024 with the same burning midsternal chest discomfort.  Around noon time he had an episode of chills and shaking which resulted in an episode of nausea and vomiting.  This was after he ate some cereal.  His chest discomfort continued.  As result, he presented for further evaluation and treatment. He denies any headache, neck pain coughing, hemoptysis, hematemesis, pain, dysuria, hematuria, hematochezia, melena.  Regarding his chills and shaking episodes, the patient states that he has never had a temperature greater than 98.9 Fahrenheit.  He states the episodes usually last about 15- 20 minutes.  He feels cold and wraps himself up in the blanket.  He states that he is able to somewhat suppress his shaking, but is no completely suppressed.  He does not lose consciousness.  He states his bilateral arms> bilateral legs shake.  He is able to speak and communicate during these episodes.  He states that he has had these intermittent episodes since his CABG 10/2023.  However he has had 4 episodes past 2 to 3 weeks.  He denies any headache, visual disturbance, focal extremity weakness coughing,, dysuria, hematuria.  Notably, the patient went to see his outpatient provider on 06/06/2024 he was placed on  ciprofloxacin  empirically.  The patient states that he took 6 days.  His last dose was 06/11/24.  The patient elected temperature 99.0 F.  He was initially tachycardic up to 122.  Saturation was 97% room air.  WBC 14.0, hemoglobin 10.8, platelets 177.  Sodium 134, potassium 3.7, bicarbonate 23, serum creatinine 0.70. Cardiology was consulted to assist with management.   In the ED, the patient had a low-grade temperature 1 0 F.  He was hemodynamically stable.  Oxygen saturation WBC 14.0, platelets 176.  BMP showed sodium 134, potassium 3.7, bicarbonate 23, serum creatinine 0.70 LFTs unremarkable.  BNP 169.0.  99>> 86.  EKG showed sinus rhythm with right bundle branch block.  CTA chest was negative for PE.  There is mild emphysematous change.  There is no infiltrates or effusions.   Assessment/Plan: Chest pain/elevated troponin -troponins flat 99>>86 -cardiology consult appreciated -echo-EF 55-60%, normal RVF, mod-severe TR, mild MR--mean gradient is increased (12 to 28 mm Hg) and  DVI is decreased (0.55 to 0.41) Cannot exclude obstruction to prosthesis.  -continue aspirin  and statin - No further chest pain   SIRS/Chills/Rigors/Shaking -Patient presented with leukocytosis and tachycardia - EEG--no seizure - 6/25 blood culture--Staph epi 2/2 sets - Check lactic acid 1.1 - Check PCT <0.10 - The patient has been on ciprofloxacin  which may compromise cultures - random cortisol--12.4 - MR brain--Small acute infarct in the left occipital lobe  - TSH 0.937 -urine culture < 10 K organisms   Staph Epi Bacteremia -- 6/25 blood culture--Staph epi 2/2 sets -repeat blood culture 6/27--GPC 2/2 sets -started empiric vanc 6/27>> -wait for final speciation -repeat blood cultures in am 6/29  Acute Ischemic Stroke -Appreciate Neurology Consult -PT/OT evaluation--no PT follow up -Speech therapy eval -MRI brain--Small acute infarct in the left occipital lobe  -MRA brain--No emergent large vessel  occlusion or proximal hemodynamically significant stenosis -CTA neck--no LVO; patent carotids and vertebral arteries -Echo--EF 55-60%, normal RVF, mod-severe TR, mild MR--mean gradient is increased (12 to 28 mm Hg) and  DVI is decreased (0.55 to 0.41) Cannot exclude obstruction to prosthesis.  -LDL--66 -HbA1C--5.1 -Antiplatelet--Aspirin  81 mg daily -06/14/24--discussed with Dr. Shelton   Hematuria -started after IV heparin  (6/25-6/26) -stopped IV heparin  after about 18 hours -discussed with urology, Dr. Alvaro -expects bleeding to gradually slowed down although may continue to have waxing and waning episodes of worsening -IV heparin  stopped - Patient is able to urinate freely -Continue finasteride    BPH -s/p laparoscopic prostatectomy 05/22/2024 - Foley catheter was removed 06/03/2024 - Able to urinate without difficulty - continue finasteride    Mixed Hyperlipidemia -continue statin   Elevated D-dimer -CTA chest neg for PE -venous duplex legs--neg   Essential HTN -hold amlodipine  temporarily -BP remains well-controlled   Aortic Stenosis -s/p bioprosthetic valve -discussed with Dr. Jessie echo -plan for TEE 6/30 if able                     Family Communication:   spouse 6/28   Consultants:  cardiology   Code Status:  FULL    DVT Prophylaxis:  SCDs     Procedures: As Listed in Progress Note Above   Antibiotics: Vanc 6/27>>          Subjective: Pt states he is still having hematuria.  Denies f/c, cp, sob, n/v/d, abd pain  Objective: Vitals:   06/15/24 0630 06/15/24 1300 06/15/24 2047 06/16/24 0624  BP: 111/74 124/70 124/69 100/67  Pulse: 73 74 84 89  Resp:      Temp: 97.7 F (36.5 C) 97.7 F (36.5 C) 98.2 F (36.8 C) 98.6 F (37 C)  TempSrc: Oral Oral Oral Oral  SpO2: 99% 99% 100% 97%  Weight:      Height:        Intake/Output Summary (Last 24 hours) at 06/16/2024 1155 Last data filed at 06/16/2024 0859 Gross per 24 hour   Intake 972.61 ml  Output --  Net 972.61 ml   Weight change:  Exam:  General:  Pt is alert, follows commands appropriately, not in acute distress HEENT: No icterus, No thrush, No neck mass, Adin/AT Cardiovascular: RRR, S1/S2, no rubs, no gallops Respiratory: CTA bilaterally, no wheezing, no crackles, no rhonchi Abdomen: Soft/+BS, non tender, non distended, no guarding Extremities: No edema, No lymphangitis, No petechiae, No rashes, no synovitis   Data Reviewed: I have personally reviewed following labs and imaging studies Basic Metabolic Panel: Recent Labs  Lab 06/12/24 1819 06/13/24 0454 06/13/24 0937 06/14/24 0426 06/15/24 0306  NA 134* 135  --  132* 134*  K 3.7 3.1*  --  3.8 3.8  CL 101 100  --  99 100  CO2 23 25  --  24 26  GLUCOSE 121* 105*  --  114* 92  BUN 15 12  --  11 15  CREATININE 0.70 0.64  --  0.75 0.76  CALCIUM  8.7* 8.6*  --  8.4* 8.3*  MG  --   --  1.9 1.9 2.0   Liver Function Tests: Recent Labs  Lab 06/12/24 1819  AST 19  ALT 12  ALKPHOS 64  BILITOT 0.8  PROT 6.5  ALBUMIN  3.1*   No  results for input(s): LIPASE, AMYLASE in the last 168 hours. No results for input(s): AMMONIA in the last 168 hours. Coagulation Profile: Recent Labs  Lab 06/12/24 1819  INR 1.1   CBC: Recent Labs  Lab 06/12/24 1819 06/13/24 0454 06/14/24 0426 06/15/24 0306 06/16/24 0230  WBC 14.0* 14.0* 14.2* 11.3* 14.0*  NEUTROABS 11.8*  --   --   --   --   HGB 10.8* 10.8* 10.5* 9.7* 11.3*  HCT 32.8* 32.5* 31.0* 30.2* 35.0*  MCV 89.1 89.0 90.9 91.2 91.9  PLT 176 177 207 188 229   Cardiac Enzymes: Recent Labs  Lab 06/13/24 0937  CKTOTAL 12*   BNP: Invalid input(s): POCBNP CBG: No results for input(s): GLUCAP in the last 168 hours. HbA1C: No results for input(s): HGBA1C in the last 72 hours. Urine analysis:    Component Value Date/Time   COLORURINE YELLOW 11/02/2023 0057   APPEARANCEUR CLEAR 11/02/2023 0057   LABSPEC 1.008 11/02/2023 0057    PHURINE 7.0 11/02/2023 0057   GLUCOSEU NEGATIVE 11/02/2023 0057   HGBUR MODERATE (A) 11/02/2023 0057   BILIRUBINUR NEGATIVE 11/02/2023 0057   KETONESUR NEGATIVE 11/02/2023 0057   PROTEINUR NEGATIVE 11/02/2023 0057   NITRITE NEGATIVE 11/02/2023 0057   LEUKOCYTESUR MODERATE (A) 11/02/2023 0057   Sepsis Labs: @LABRCNTIP (procalcitonin:4,lacticidven:4) ) Recent Results (from the past 240 hours)  Culture, blood (Routine X 2) w Reflex to ID Panel     Status: Abnormal (Preliminary result)   Collection Time: 06/12/24 10:55 PM   Specimen: BLOOD  Result Value Ref Range Status   Specimen Description   Final    BLOOD BLOOD LEFT ARM Performed at [facility], [address] [place], [place] [address]    Special Requests   Final    BOTTLES DRAWN AEROBIC AND ANAEROBIC Blood Culture adequate volume Performed at [facility], [address] [place], [place] [address]    Culture  Setup Time   Final    GRAM POSITIVE COCCI IN BOTH AEROBIC AND ANAEROBIC BOTTLES CRITICAL VALUE NOTED.  VALUE IS CONSISTENT WITH PREVIOUSLY REPORTED AND CALLED VALUE. JESSICA ELLER AT 2315 06/13/24 BY A. SNYDER GRAM STAIN REVIEWED-AGREE WITH RESULT    Culture (A)  Final    STAPHYLOCOCCUS EPIDERMIDIS CULTURE REINCUBATED FOR BETTER GROWTH Performed at [facility], [address] [place], [place] [address]    Report Status PENDING  Incomplete  Culture, blood (Routine X 2) w Reflex to ID Panel     Status: Abnormal (Preliminary result)   Collection Time: 06/12/24 10:57 PM   Specimen: BLOOD LEFT HAND  Result Value Ref Range Status   Specimen Description   Final    BLOOD LEFT HAND Performed at [facility], [address] [place], [place] [address]    Special Requests   Final    BOTTLES DRAWN AEROBIC AND ANAEROBIC Blood Culture adequate volume Performed at [facility], [address], [place], [place] [address]    Culture  Setup Time   Final    GRAM POSITIVE COCCI IN BOTH AEROBIC AND ANAEROBIC  BOTTLES Gram Stain Report Called to,Read Back By and Verified With: JESSICA ELLER,RN AT 2315 06/13/24 BY A. SNYDER CRITICAL RESULT CALLED TO, READ BACK BY AND VERIFIED WITHBETHA LOISE MORONES RN 06/14/2024 @ 0414 BY AB Performed at [facility], [address] [place], [place] [address]    Culture (A)  Final    STAPHYLOCOCCUS EPIDERMIDIS SUSCEPTIBILITIES TO FOLLOW Performed at [facility], [address] [place], [place] [address]  Report Status PENDING  Incomplete  Blood Culture ID Panel (Reflexed)     Status: Abnormal   Collection Time: 06/12/24 10:57 PM  Result Value Ref Range Status   Enterococcus faecalis NOT DETECTED NOT DETECTED Final   Enterococcus Faecium NOT DETECTED NOT DETECTED Final   Listeria monocytogenes NOT DETECTED NOT DETECTED Final   Staphylococcus species DETECTED (A) NOT DETECTED Final    Comment: CRITICAL RESULT CALLED TO, READ BACK BY AND VERIFIED WITH: N BAUGH RN 06/14/2024 @ 0414 BY AB    Staphylococcus aureus (BCID) NOT DETECTED NOT DETECTED Final   Staphylococcus epidermidis DETECTED (A) NOT DETECTED Final    Comment: Methicillin (oxacillin) resistant coagulase negative staphylococcus. Possible blood culture contaminant (unless isolated from more than one blood culture draw or clinical case suggests pathogenicity). No antibiotic treatment is indicated for blood  culture contaminants. CRITICAL RESULT CALLED TO, READ BACK BY AND VERIFIED WITH: N BAUGH RN 06/14/2024 @ 0414 BY AB    Staphylococcus lugdunensis NOT DETECTED NOT DETECTED Final   Streptococcus species NOT DETECTED NOT DETECTED Final   Streptococcus agalactiae NOT DETECTED NOT DETECTED Final   Streptococcus pneumoniae NOT DETECTED NOT DETECTED Final   Streptococcus pyogenes NOT DETECTED NOT DETECTED Final   A.calcoaceticus-baumannii NOT DETECTED NOT DETECTED Final   Bacteroides fragilis NOT DETECTED NOT DETECTED Final   Enterobacterales NOT DETECTED NOT DETECTED Final   Enterobacter cloacae  complex NOT DETECTED NOT DETECTED Final   Escherichia coli NOT DETECTED NOT DETECTED Final   Klebsiella aerogenes NOT DETECTED NOT DETECTED Final   Klebsiella oxytoca NOT DETECTED NOT DETECTED Final   Klebsiella pneumoniae NOT DETECTED NOT DETECTED Final   Proteus species NOT DETECTED NOT DETECTED Final   Salmonella species NOT DETECTED NOT DETECTED Final   Serratia marcescens NOT DETECTED NOT DETECTED Final   Haemophilus influenzae NOT DETECTED NOT DETECTED Final   Neisseria meningitidis NOT DETECTED NOT DETECTED Final   Pseudomonas aeruginosa NOT DETECTED NOT DETECTED Final   Stenotrophomonas maltophilia NOT DETECTED NOT DETECTED Final   Candida albicans NOT DETECTED NOT DETECTED Final   Candida auris NOT DETECTED NOT DETECTED Final   Candida glabrata NOT DETECTED NOT DETECTED Final   Candida krusei NOT DETECTED NOT DETECTED Final   Candida parapsilosis NOT DETECTED NOT DETECTED Final   Candida tropicalis NOT DETECTED NOT DETECTED Final   Cryptococcus neoformans/gattii NOT DETECTED NOT DETECTED Final   Methicillin resistance mecA/C DETECTED (A) NOT DETECTED Final    Comment: CRITICAL RESULT CALLED TO, READ BACK BY AND VERIFIED WITHBETHA LOISE MORONES RN 06/14/2024 @ 0414 BY AB Performed at [facility], [address], [place], [place] [address]   Urine Culture     Status: Abnormal   Collection Time: 06/13/24  1:20 PM   Specimen: Urine, Clean Catch  Result Value Ref Range Status   Specimen Description   Final    URINE, CLEAN CATCH Performed at [facility], [address], [place], [place] [address]    Special Requests   Final    NONE Performed at [facility], [address], [place], [place] [address]    Culture (A)  Final    <10,000 COLONIES/mL INSIGNIFICANT GROWTH Performed at [facility], [address], [place], [place] [address]    Report Status 06/14/2024 FINAL  Final  Culture, blood (Routine X 2) w Reflex to ID Panel     Status: None (Preliminary result)    Collection Time: 06/14/24  7:44 AM   Specimen: BLOOD  Result  Value Ref Range Status   Specimen Description   Final    BLOOD RIGHT ANTECUBITAL Performed at [facility], [address] [place], [place] [address]    Special Requests   Final    BOTTLES DRAWN AEROBIC AND ANAEROBIC Blood Culture adequate volume Performed at [facility], [address], [place], [place] [address]    Culture  Setup Time   Final    GRAM POSITIVE COCCI IN BOTH AEROBIC AND ANAEROBIC BOTTLES Gram Stain Report Called to,Read Back By and Verified With: DSABRA MOLT ON 06/15/2024 @12 :50 BY T.HAMER Performed at [facility], [address], [place], [place] [address]    Culture   Final    GRAM POSITIVE COCCI IDENTIFICATION TO FOLLOW Performed at [facility], [address], [place], [place] [address]    Report Status PENDING  Incomplete  Culture, blood (Routine X 2) w Reflex to ID Panel     Status: None (Preliminary result)   Collection Time: 06/14/24  7:44 AM   Specimen: BLOOD  Result Value Ref Range Status   Specimen Description   Final    BLOOD LEFT ANTECUBITAL Performed at [facility], [address], [place], [place] [address]    Special Requests   Final    BOTTLES DRAWN AEROBIC AND ANAEROBIC Blood Culture adequate volume Performed at [facility], [address], [place], [place] [address]    Culture  Setup Time   Final    GRAM POSITIVE COCCI IN BOTH AEROBIC AND ANAEROBIC BOTTLES Gram Stain Report Called to,Read Back By and Verified With: B. ROBERTSON ON 06/15/2024 @13 :49 BY T.HAMER Performed at [facility], [address], [place], [place] [address]    Culture   Final    GRAM POSITIVE COCCI CULTURE REINCUBATED FOR BETTER GROWTH Performed at [facility], [address] [place], [place] [address]    Report Status PENDING  Incomplete  Culture, blood (Routine X 2) w Reflex to ID Panel     Status: None (Preliminary result)   Collection Time: 06/16/24  2:30 AM   Specimen: BLOOD LEFT ARM   Result Value Ref Range Status   Specimen Description BLOOD LEFT ARM  Final   Special Requests AEROBIC BOTTLE ONLY Blood Culture adequate volume  Final   Culture   Final    NO GROWTH < 12 HOURS Performed at [facility], [address] [place], [place] [address]    Report Status PENDING  Incomplete  Culture, blood (Routine X 2) w Reflex to ID Panel     Status: None (Preliminary result)   Collection Time: 06/16/24  2:35 AM   Specimen: Right Antecubital; Blood  Result Value Ref Range Status   Specimen Description RIGHT ANTECUBITAL  Final   Special Requests   Final    BOTTLES DRAWN AEROBIC AND ANAEROBIC Blood Culture adequate volume   Culture   Final    NO GROWTH < 12 HOURS Performed at [facility], [address], [place], [place] [address]    Report Status PENDING  Incomplete     Scheduled Meds:  aspirin   81 mg Oral Daily   finasteride   5 mg Oral QPM   pantoprazole  (PROTONIX ) IV  40 mg Intravenous Q24H   rosuvastatin   40 mg Oral Daily   Continuous Infusions:  vancomycin  1,250 mg (06/16/24 0928)    Procedures/Studies: ECHOCARDIOGRAM COMPLETE Result Date: 06/14/2024    ECHOCARDIOGRAM REPORT   Patient Name:   Kyle Matthews Date of Exam: 06/13/2024 Medical Rec #:  [id number]  Height:       62.0 in Accession #:    [id number] Weight:       153.2 lb Date of Birth:  1946/08/24   BSA:          1.707 m Patient Age:    77 years   BP:           118/71 mmHg Patient Gender: M          HR:           88 bpm. Exam Location:  [facility] Procedure: 2D Echo, Cardiac Doppler and Color Doppler (Both Spectral and Color            Flow Doppler were utilized during procedure). Indications:    Chest Pain  History:        Patient has prior history of Echocardiogram examinations. Risk                 Factors:Hypertension.  Sonographer:    Vella Key Referring Phys: [id number] EJIROGHENE E EMOKPAE IMPRESSIONS  1. Left ventricular ejection fraction, by estimation, is 55 to 60%. The left ventricle has normal function. There  is mild concentric left ventricular hypertrophy. Indeterminate diastolic filling due to E-A fusion.  2. Right ventricular systolic function is normal. The right ventricular size is normal.  3. Right atrial size was moderately dilated.  4. Mild mitral valve regurgitation.  5. Tricuspid valve regurgitation is moderate to severe.  6. S/p AVR (23 mm Inspiris Resilia prosthesis; procedure date 11/02/23). Peak and mean gradients through the valve are 46 and 28 mm Hg respectively Acceleration time is 89 msec Ejection time is 209 msec (AT/ET is 0.39) DVI is 0.39. Compared to echo report from December 2024, mean gradient is increased (12 to 28 mm Hg) and DVI is decreased (0.55 to 0.41) Cannot exclude obstruction to prosthesis.     Consider TEE to evaluate furtherr.. The aortic valve has been repaired/replaced. Aortic valve regurgitation is not visualized. Aortic valve sclerosis/calcification is present, without any evidence of aortic stenosis. FINDINGS  Left Ventricle: Left ventricular ejection fraction, by estimation, is 55 to 60%. The left ventricle has normal function. The left ventricular internal cavity size was normal in size. There is mild concentric left ventricular hypertrophy. Indeterminate diastolic filling due to E-A fusion. Right Ventricle: The right ventricular size is normal. Right vetricular wall thickness was not assessed. Right ventricular systolic function is normal. Left Atrium: Left atrial size was normal in size. Right Atrium: Right atrial size was moderately dilated. Pericardium: There is no evidence of pericardial effusion. Mitral Valve: There is mild thickening of the mitral valve leaflet(s). Mild mitral annular calcification. Mild mitral valve regurgitation. Tricuspid Valve: The tricuspid valve is normal in structure. Tricuspid valve regurgitation is moderate to severe. Aortic Valve: S/p AVR (23 mm Inspiris Resilia prosthesis; procedure date 11/02/23). Peak and mean gradients through the valve are  46 and 28 mm Hg respectively Acceleration time is 89 msec Ejection time is 209 msec (AT/ET is 0.39) DVI is 0.39. Compared to  echo report from December 2024, mean gradient is increased (12 to 28 mm Hg) and DVI is decreased (0.55 to 0.41) Cannot exclude obstruction to prosthesis. Consider TEE to evaluate furtherr. The aortic valve has been repaired/replaced. Aortic valve regurgitation is not visualized. Aortic valve sclerosis/calcification is present, without any evidence of aortic stenosis. Aortic valve mean gradient measures 23.5 mmHg. Aortic valve peak gradient measures 37.6 mmHg. Aortic valve area, by VTI measures 0.93 cm. Pulmonic  Valve: The pulmonic valve was not well visualized. Pulmonic valve regurgitation is not visualized. Aorta: The aortic root is normal in size and structure. IAS/Shunts: No atrial level shunt detected by color flow Doppler.  LEFT VENTRICLE PLAX 2D LVIDd:         4.00 cm      Diastology LVIDs:         2.90 cm      LV e' medial:    7.40 cm/s LV PW:         1.20 cm      LV E/e' medial:  12.5 LV IVS:        1.20 cm      LV e' lateral:   9.36 cm/s LVOT diam:     1.70 cm      LV E/e' lateral: 9.9 LV SV:         49 LV SV Index:   29 LVOT Area:     2.27 cm  LV Volumes (MOD) LV vol d, MOD A2C: 104.0 ml LV vol d, MOD A4C: 154.0 ml LV vol s, MOD A2C: 52.1 ml LV vol s, MOD A4C: 69.7 ml LV SV MOD A2C:     51.9 ml LV SV MOD A4C:     154.0 ml LV SV MOD BP:      65.7 ml RIGHT VENTRICLE RV Basal diam:  5.00 cm RV S prime:     10.80 cm/s TAPSE (M-mode): 1.3 cm LEFT ATRIUM           Index        RIGHT ATRIUM           Index LA diam:      3.70 cm 2.17 cm/m   RA Area:     28.10 cm LA Vol (A2C): 94.7 ml 55.48 ml/m  RA Volume:   113.00 ml 66.20 ml/m LA Vol (A4C): 47.4 ml 27.77 ml/m  AORTIC VALVE AV Area (Vmax):    0.84 cm AV Area (Vmean):   0.81 cm AV Area (VTI):     0.93 cm AV Vmax:           306.50 cm/s AV Vmean:          230.500 cm/s AV VTI:            0.528 m AV Peak Grad:      37.6 mmHg AV Mean  Grad:      23.5 mmHg LVOT Vmax:         114.00 cm/s LVOT Vmean:        82.200 cm/s LVOT VTI:          0.217 m LVOT/AV VTI ratio: 0.41  AORTA Ao Root diam: 2.90 cm MITRAL VALVE                TRICUSPID VALVE MV Area (PHT): 8.25 cm     TV Peak grad:   24.0 mmHg MV Decel Time: 92 msec      TV Vmax:        2.45 m/s MV E velocity: 92.20 cm/s   TR Peak grad:   24.0 mmHg MV A velocity: 132.00 cm/s  TR Vmax:        245.00 cm/s MV E/A ratio:  0.70                             SHUNTS  Systemic VTI:  0.22 m                             Systemic Diam: 1.70 cm Vina Gull MD Electronically signed by Vina Gull MD Signature Date/Time: 06/14/2024/11:51:30 AM    Final    EEG adult Result Date: 06/14/2024 Shelton Arlin KIDD, MD     06/14/2024  7:41 AM Patient Name: Tallan Sandoz MRN: [id number] Epilepsy Attending: Arlin KIDD Shelton Referring Physician/Provider: Evonnie Lenis, MD Date: 06/13/2024 Duration: 23.04 mins Patient history: 78yo M with episodes of shaking. EEG to evaluate for seizure. Level of alertness: Awake AEDs during EEG study: None Technical aspects: This EEG study was done with scalp electrodes positioned according to the 10-20 International system of electrode placement. Electrical activity was reviewed with band pass filter of 1-70Hz , sensitivity of 7 uV/mm, display speed of 2mm/sec with a 60Hz  notched filter applied as appropriate. EEG data were recorded continuously and digitally stored.  Video monitoring was available and reviewed as appropriate. Description: The posterior dominant rhythm consists of 8-9 Hz activity of moderate voltage (25-35 uV) seen predominantly in posterior head regions, symmetric and reactive to eye opening and eye closing. Physiologic photic driving was not seen during photic stimulation.  Hyperventilation was not performed.   IMPRESSION: This study is within normal limits. No seizures or epileptiform discharges were seen throughout the recording. A normal interictal EEG  does not exclude the diagnosis of epilepsy. Priyanka KIDD Shelton   CT ANGIO NECK W OR WO CONTRAST Result Date: 06/13/2024 CLINICAL DATA:  Follow-up examination for stroke. EXAM: CT ANGIOGRAPHY NECK TECHNIQUE: Multidetector CT imaging of the neck was performed using the standard protocol during bolus administration of intravenous contrast. Multiplanar CT image reconstructions and MIPs were obtained to evaluate the vascular anatomy. Carotid stenosis measurements (when applicable) are obtained utilizing NASCET criteria, using the distal internal carotid diameter as the denominator. RADIATION DOSE REDUCTION: This exam was performed according to the departmental dose-optimization program which includes automated exposure control, adjustment of the mA and/or kV according to patient size and/or use of iterative reconstruction technique. CONTRAST:  75mL OMNIPAQUE  IOHEXOL  350 MG/ML SOLN COMPARISON:  Prior brain MRI from earlier the same day. FINDINGS: Aortic arch: Visualized aortic arch within normal limits for caliber with standard 3 vessel morphology. Moderate aortic atherosclerosis. No high-grade stenosis about the origin the great vessels. Right carotid system: Right common and internal carotid arteries are patent without dissection. Moderate calcified plaque about the right carotid bulb/proximal right ICA without hemodynamically significant greater than 50% stenosis. Left carotid system: Left common and internal carotid arteries are patent without dissection. Moderate calcified plaque about the left carotid bulb/proximal cervical left ICA without hemodynamically significant greater than 50% stenosis. Vertebral arteries: Both vertebral arteries arise from subclavian arteries. No significant proximal subclavian artery stenosis. Vertebral arteries M cells are patent without significant stenosis or dissection. Skeleton: No worrisome osseous lesions.  Prior sternotomy noted. Other neck: No other acute finding. Few small  thyroid  nodules noted, largest of which measures 8 mm on the right. These are of doubtful significance given size and patient age, no follow-up imaging recommended (ref: J Am Coll Radiol. 2015 Feb;12(2): 143-50). Upper chest: Upper lobe predominant emphysema.  No acute finding. IMPRESSION: 1. Negative CTA for large vessel occlusion. 2. Moderate atherosclerotic change about the carotid bifurcations/proximal ICAs without hemodynamically significant greater than 50% stenosis. 3. Wide patency of both vertebral arteries within the neck. 4. Aortic Atherosclerosis (ICD10-I70.0) and  Emphysema (ICD10-J43.9). Electronically Signed   By: Morene Hoard M.D.   On: 06/13/2024 22:56   MR BRAIN WO CONTRAST Result Date: 06/13/2024 CLINICAL DATA:  seizure like activity EXAM: MRI HEAD WITHOUT CONTRAST MRA HEAD WITHOUT CONTRAST TECHNIQUE: Multiplanar, multi-echo pulse sequences of the brain and surrounding structures were acquired without intravenous contrast. Angiographic images of the Circle of Willis were acquired using MRA technique without intravenous contrast. COMPARISON:  CT head 15 2007. 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 No significant mass effect or midline shift. Additional patchy T2/FLAIR hyperintensity in the white matter compatible with microvascular ischemic disease. No evidence of acute hemorrhage, mass lesion, or hydrocephalus. Vascular: See below. Skull and upper cervical spine: Normal marrow signal. Sinuses/Orbits: Mostly clear sinuses.  No acute orbital findings. Other: No mastoid effusions. MRA HEAD FINDINGS Anterior circulation: Bilateral intracranial ICAs, MCAs, and ACAs are patent without proximal hemodynamically significant stenosis. Posterior circulation:Bilateral intradural vertebral arteries, basilar artery and bilateral posterior cerebral arteries are patent without proximally in amic least significant stenosis. IMPRESSION: 1. Small acute infarct in  the left occipital lobe. 2. No emergent large vessel occlusion or proximal hemodynamically significant stenosis. Electronically Signed   By: Gilmore GORMAN Molt M.D.   On: 06/13/2024 16:11   MR ANGIO HEAD WO CONTRAST Result Date: 06/13/2024 CLINICAL DATA:  seizure like activity EXAM: MRI HEAD WITHOUT CONTRAST MRA HEAD WITHOUT CONTRAST TECHNIQUE: Multiplanar, multi-echo pulse sequences of the brain and surrounding structures were acquired without intravenous contrast. Angiographic images of the Circle of Willis were acquired using MRA technique without intravenous contrast. COMPARISON:  CT head 15 2007. 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 No significant mass effect or midline shift. Additional patchy T2/FLAIR hyperintensity in the white matter compatible with microvascular ischemic disease. No evidence of acute hemorrhage, mass lesion, or hydrocephalus. Vascular: See below. Skull and upper cervical spine: Normal marrow signal. Sinuses/Orbits: Mostly clear sinuses.  No acute orbital findings. Other: No mastoid effusions. MRA HEAD FINDINGS Anterior circulation: Bilateral intracranial ICAs, MCAs, and ACAs are patent without proximal hemodynamically significant stenosis. Posterior circulation:Bilateral intradural vertebral arteries, basilar artery and bilateral posterior cerebral arteries are patent without proximally in amic least significant stenosis. IMPRESSION: 1. 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 2. No emergent large vessel occlusion or proximal hemodynamically significant stenosis. Electronically Signed   By: Gilmore GORMAN Molt M.D.   On: 06/13/2024 16:11   US  Venous Img Lower Bilateral Result Date: 06/13/2024 CLINICAL DATA:  Bilateral leg swelling. EXAM: BILATERAL LOWER EXTREMITY VENOUS DOPPLER ULTRASOUND TECHNIQUE: Gray-scale sonography with graded compression, as well as color Doppler and duplex ultrasound were performed to evaluate the lower  extremity deep venous systems from the level of the common femoral vein and including the common femoral, femoral, profunda femoral, popliteal and calf veins including the posterior tibial, peroneal and gastrocnemius veins when visible. The superficial great saphenous vein was also interrogated. Spectral Doppler was utilized to evaluate flow at rest and with distal augmentation maneuvers in the common femoral, femoral and popliteal veins. COMPARISON:  None Available. FINDINGS: RIGHT LOWER EXTREMITY Common Femoral Vein: No evidence of thrombus. Normal compressibility, respiratory phasicity and response to augmentation. Saphenofemoral Junction: No evidence of thrombus. Normal compressibility and flow on color Doppler imaging. Profunda Femoral Vein: No evidence of thrombus. Normal compressibility and flow on color Doppler imaging. Femoral Vein: No evidence of thrombus. Normal compressibility, respiratory phasicity and response to augmentation. Popliteal Vein: No evidence of thrombus.  Normal compressibility, respiratory phasicity and response to augmentation. Calf Veins: No evidence of thrombus. Normal compressibility and flow on color Doppler imaging. Superficial Great Saphenous Vein: No evidence of thrombus. Normal compressibility. Other Findings:  None. LEFT LOWER EXTREMITY Common Femoral Vein: No evidence of thrombus. Normal compressibility, respiratory phasicity and response to augmentation. Saphenofemoral Junction: No evidence of thrombus. Normal compressibility and flow on color Doppler imaging. Profunda Femoral Vein: No evidence of thrombus. Normal compressibility and flow on color Doppler imaging. Femoral Vein: No evidence of thrombus. Normal compressibility, respiratory phasicity and response to augmentation. Popliteal Vein: No evidence of thrombus. Normal compressibility, respiratory phasicity and response to augmentation. Calf Veins: No evidence of thrombus. Normal compressibility and flow on color Doppler  imaging. Superficial Great Saphenous Vein: No evidence of thrombus. Normal compressibility. Other Findings:  None. IMPRESSION: No evidence of deep venous thrombosis in either lower extremity. Electronically Signed   By: Juliene Balder M.D.   On: 06/13/2024 10:52   CT Angio Chest PE W and/or Wo Contrast Result Date: 06/12/2024 CLINICAL DATA:  Positive D-dimer and chest pain, initial encounter EXAM: CT ANGIOGRAPHY CHEST WITH CONTRAST TECHNIQUE: Multidetector CT imaging of the chest was performed using the standard protocol during bolus administration of intravenous contrast. Multiplanar CT image reconstructions and MIPs were obtained to evaluate the vascular anatomy. RADIATION DOSE REDUCTION: This exam was performed according to the departmental dose-optimization program which includes automated exposure control, adjustment of the mA and/or kV according to patient size and/or use of iterative reconstruction technique. CONTRAST:  75mL OMNIPAQUE  IOHEXOL  350 MG/ML SOLN COMPARISON:  Chest x-ray from earlier in the same day. CT from 10/10/2023 FINDINGS: Cardiovascular: Atherosclerotic calcifications of the aorta are noted. No aneurysmal dilatation is seen. 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 Coronary calcifications are noted. The pulmonary artery shows a normal branching pattern bilaterally. No filling defect to suggest pulmonary embolism is noted. Mediastinum/Nodes: Thoracic inlet is within normal limits. No hilar or mediastinal adenopathy is noted. The esophagus as visualized is within normal limits. Lungs/Pleura: Lungs are well aerated bilaterally. Mild emphysematous changes are seen. Parenchymal nodule is seen. No infiltrate or effusion is noted. Upper Abdomen: Visualized upper abdomen demonstrates stable simple cyst within the right lobe of the liver. Musculoskeletal: Degenerative changes of the thoracic spine are seen. No acute bony abnormality is noted. Review of the MIP images confirms the above  findings. IMPRESSION: No evidence of pulmonary embolism. No acute abnormality seen. Aortic Atherosclerosis (ICD10-I70.0) and Emphysema (ICD10-J43.9). Electronically Signed   By: Oneil Devonshire M.D.   On: 06/12/2024 19:24   DG Chest Port 1 View Result Date: 06/12/2024 CLINICAL DATA:  Chest pain EXAM: PORTABLE CHEST 1 VIEW COMPARISON:  05/07/2024 FINDINGS: Prior CABG and valve replacement. Heart and mediastinal contours are within normal limits. No focal opacities or effusions. No acute bony abnormality. Aortic atherosclerosis. IMPRESSION: No active cardiopulmonary disease. Electronically Signed   By: Franky Crease M.D.   On: 06/12/2024 18:05    Alm Schneider, DO  [facility]  If 7PM-7AM, please contact night-coverage [email or web address] Password [email or web address] 06/16/2024, 11:55 AM   LOS: 1 day

## 2024-06-17 DIAGNOSIS — I639 Cerebral infarction, unspecified: Secondary | ICD-10-CM | POA: Diagnosis not present

## 2024-06-17 DIAGNOSIS — I2510 Atherosclerotic heart disease of native coronary artery without angina pectoris: Secondary | ICD-10-CM | POA: Diagnosis not present

## 2024-06-17 DIAGNOSIS — I214 Non-ST elevation (NSTEMI) myocardial infarction: Secondary | ICD-10-CM | POA: Diagnosis not present

## 2024-06-17 DIAGNOSIS — R0789 Other chest pain: Secondary | ICD-10-CM

## 2024-06-17 DIAGNOSIS — R079 Chest pain, unspecified: Secondary | ICD-10-CM | POA: Diagnosis not present

## 2024-06-17 DIAGNOSIS — R7881 Bacteremia: Secondary | ICD-10-CM | POA: Diagnosis not present

## 2024-06-17 DIAGNOSIS — R6510 Systemic inflammatory response syndrome (SIRS) of non-infectious origin without acute organ dysfunction: Secondary | ICD-10-CM | POA: Diagnosis not present

## 2024-06-17 DIAGNOSIS — I509 Heart failure, unspecified: Secondary | ICD-10-CM | POA: Diagnosis not present

## 2024-06-17 DIAGNOSIS — B957 Other staphylococcus as the cause of diseases classified elsewhere: Secondary | ICD-10-CM | POA: Diagnosis not present

## 2024-06-17 DIAGNOSIS — J449 Chronic obstructive pulmonary disease, unspecified: Secondary | ICD-10-CM | POA: Diagnosis not present

## 2024-06-17 LAB — BASIC METABOLIC PANEL WITH GFR
Anion gap: 10 (ref 5–15)
BUN: 9 mg/dL (ref 8–23)
CO2: 22 mmol/L (ref 22–32)
Calcium: 8.2 mg/dL — ABNORMAL LOW (ref 8.9–10.3)
Chloride: 102 mmol/L (ref 98–111)
Creatinine, Ser: 0.59 mg/dL — ABNORMAL LOW (ref 0.61–1.24)
GFR, Estimated: >60 mL/min (ref >60)
Glucose, Bld: 92 mg/dL (ref 70–99)
Potassium: 3.4 mmol/L — ABNORMAL LOW (ref 3.5–5.1)
Sodium: 134 mmol/L — ABNORMAL LOW (ref 135–145)

## 2024-06-17 LAB — VANCOMYCIN, TROUGH: Vancomycin Tr: 4 ug/mL — ABNORMAL LOW (ref 15–20)

## 2024-06-17 LAB — CULTURE, BLOOD (ROUTINE X 2)
Special Requests: ADEQUATE
Special Requests: ADEQUATE
Special Requests: ADEQUATE
Special Requests: ADEQUATE

## 2024-06-17 LAB — CBC
HCT: 29.8 % — ABNORMAL LOW (ref 39.0–52.0)
Hemoglobin: 10.1 g/dL — ABNORMAL LOW (ref 13.0–17.0)
MCH: 30.5 pg (ref 26.0–34.0)
MCHC: 33.9 g/dL (ref 30.0–36.0)
MCV: 90 fL (ref 80.0–100.0)
Platelets: 204 10*3/uL (ref 150–400)
RBC: 3.31 MIL/uL — ABNORMAL LOW (ref 4.22–5.81)
RDW: 13.6 % (ref 11.5–15.5)
WBC: 11.5 10*3/uL — ABNORMAL HIGH (ref 4.0–10.5)
nRBC: 0 % (ref 0.0–0.2)

## 2024-06-17 MED ORDER — VANCOMYCIN HCL IN DEXTROSE 1-5 GM/200ML-% IV SOLN
1000.0000 mg | Freq: Two times a day (BID) | INTRAVENOUS | Status: DC
  Administered 2024-06-17 – 2024-06-19 (×4): 1000 mg via INTRAVENOUS
  Filled 2024-06-17 (×4): qty 200

## 2024-06-17 NOTE — Progress Notes (Signed)
 PROGRESS NOTE  Kyle Matthews FMW:[id number] DOB: Jan 03, 1946 DOA: 06/12/2024 PCP: Verena Mems, MD  Brief History:  78 year old male with a history of coronary artery disease status post CABG 10/2023, AS s/p bioprosthetic AVR, diastolic dysfunction, stroke, postoperative atrial fibrillation post CABG, HTN, HLD, BPH with urinary retention status post robotic assisted laparoscopic simple prostatectomy 05/22/2024, hypertension, hyperlipidemia, presenting with midsternal chest discomfort subjective chills and rigors. Regarding the chest discomfort, the patient stated that the sensation along his mid sternum evening of 06/11/2024 while watching television.  He went to bed and woke up on the morning of 06/12/2024 with the same burning midsternal chest discomfort.  Around noon time he had an episode of chills and shaking which resulted in an episode of nausea and vomiting.  This was after he ate some cereal.  His chest discomfort continued.  As result, he presented for further evaluation and treatment. He denies any headache, neck pain coughing, hemoptysis, hematemesis, pain, dysuria, hematuria, hematochezia, melena.  Regarding his chills and shaking episodes, the patient states that he has never had a temperature greater than 98.9 Fahrenheit.  He states the episodes usually last about 15- 20 minutes.  He feels cold and wraps himself up in the blanket.  He states that he is able to somewhat suppress his shaking, but is no completely suppressed.  He does not lose consciousness.  He states his bilateral arms> bilateral legs shake.  He is able to speak and communicate during these episodes.  He states that he has had these intermittent episodes since his CABG 10/2023.  However he has had 4 episodes past 2 to 3 weeks.  He denies any headache, visual disturbance, focal extremity weakness coughing,, dysuria, hematuria.  Notably, the patient went to see his outpatient provider on 06/06/2024 he was placed on  ciprofloxacin  empirically.  The patient states that he took 6 days.  His last dose was 06/11/24.  The patient elected temperature 99.0 F.  He was initially tachycardic up to 122.  Saturation was 97% room air.  WBC 14.0, hemoglobin 10.8, platelets 177.  Sodium 134, potassium 3.7, bicarbonate 23, serum creatinine 0.70. Cardiology was consulted to assist with management.   In the ED, the patient had a low-grade temperature 1 0 F.  He was hemodynamically stable.  Oxygen saturation WBC 14.0, platelets 176.  BMP showed sodium 134, potassium 3.7, bicarbonate 23, serum creatinine 0.70 LFTs unremarkable.  BNP 169.0.  99>> 86.  EKG showed sinus rhythm with right bundle branch block.  CTA chest was negative for PE.  There is mild emphysematous change.  There is no infiltrates or effusions. Patient was initially placed on IV heparin  which was stopped after 18 hours after ACS and thromboembolic events were ruled out.   He developed hematuria which has overall improved.   Blood cultures grew MRSE in 4/4 sets.  He was started on IV vancomycin .  TTE showed some abnormality with the AV gradient.  Plan it to obtain TEE (unavailable at [facility] this week).  ID has been consulted and Dr. Fleeta Rothman will see.   Assessment/Plan:  Chest pain/elevated troponin -troponins flat 99>>86 -cardiology consult appreciated -echo-EF 55-60%, normal RVF, mod-severe TR, mild MR--mean gradient is increased (12 to 28 mm Hg) and  DVI is decreased (0.55 to 0.41) Cannot exclude obstruction to prosthesis.  -continue aspirin  and statin - No further chest pain   SIRS/Chills/Rigors/Shaking -Patient presented with leukocytosis and tachycardia - EEG--no seizure - 6/25 blood culture--Staph  epi 2/2 sets - Check lactic acid 1.1 - Check PCT <0.10 - The patient has been on ciprofloxacin  which may compromise cultures - random cortisol--12.4 - MR brain--Small acute infarct in the left occipital lobe  - TSH 0.937 -urine culture < 10 K organisms -  overall improved   Staph Epi Bacteremia -- 6/25 blood culture--Staph epi 2/2 sets -repeat blood culture 6/27--GPC 2/2 sets -started empiric vanc 6/27>> -repeat blood cultures in am 6/29--neg to date -ID consulted--Dr. Fleeta Rothman is aware   Acute Ischemic Stroke -Appreciate Neurology Consult -incidental finding -PT/OT evaluation--no PT follow up -MRI brain--Small acute infarct in the left occipital lobe  -MRA brain--No emergent large vessel occlusion or proximal hemodynamically significant stenosis -CTA neck--no LVO; patent carotids and vertebral arteries -Echo--EF 55-60%, normal RVF, mod-severe TR, mild MR--mean gradient is increased (12 to 28 mm Hg) and  DVI is decreased (0.55 to 0.41) Cannot exclude obstruction to prosthesis.  -LDL--66 -HbA1C--5.1 -Antiplatelet--Aspirin  81 mg daily -06/14/24--discussed with Dr. Shelton   Hematuria -started after IV heparin  (6/25-6/26) -stopped IV heparin  after about 18 hours -discussed with urology, Dr. Alvaro -expects bleeding to gradually slowed down although may continue to have waxing and waning episodes of worsening -IV heparin  stopped - Patient is able to urinate freely -Continue finasteride  -overall has improved, Hgb remains stable   BPH -s/p laparoscopic prostatectomy 05/22/2024 - Foley catheter was removed 06/03/2024 - Able to urinate without difficulty - continue finasteride    Mixed Hyperlipidemia -continue statin   Elevated D-dimer -CTA chest neg for PE -venous duplex legs--neg   Essential HTN -hold amlodipine  temporarily -BP remains well-controlled   Aortic Stenosis -s/p bioprosthetic valve -discussed with Dr. Jessie echo -plan for TEE at [facility]--Cardiology is aware                     Family Communication:   spouse 6/30   Consultants:  cardiology   Code Status:  FULL    DVT Prophylaxis:  SCDs     Procedures: As Listed in Progress Note Above   Antibiotics: Vanc 6/27>>             Subjective: Patient denies fevers, chills, headache, chest pain, dyspnea, nausea, vomiting, diarrhea, abdominal pain, dysuria,  hematochezia, and melena.  Urine is almost completely clear   Objective: Vitals:   06/16/24 0624 06/16/24 1245 06/16/24 2003 06/17/24 0406  BP: 100/67 109/67 130/70 125/75  Pulse: 89 79 74 82  Resp:      Temp: 98.6 F (37 C) 99.1 F (37.3 C) 99 F (37.2 C) 99.6 F (37.6 C)  TempSrc: Oral Oral Oral Oral  SpO2: 97% 96% 98% 95%  Weight:      Height:        Intake/Output Summary (Last 24 hours) at 06/17/2024 1127 Last data filed at 06/17/2024 0847 Gross per 24 hour  Intake 720 ml  Output --  Net 720 ml   Weight change:  Exam:  General:  Pt is alert, follows commands appropriately, not in acute distress HEENT: No icterus, No thrush, No neck mass, [place]/AT Cardiovascular: RRR, S1/S2, no rubs, no gallops Respiratory: CTA bilaterally, no wheezing, no crackles, no rhonchi Abdomen: Soft/+BS, non tender, non distended, no guarding Extremities: No edema, No lymphangitis, No petechiae, No rashes, no synovitis   Data Reviewed: I have personally reviewed following labs and imaging studies Basic Metabolic Panel: Recent Labs  Lab 06/12/24 1819 06/13/24 0454 06/13/24 9062 06/14/24 0426 06/15/24 0306 06/17/24 0312  NA 134* 135  --  132* 134* 134*  K 3.7 3.1*  --  3.8 3.8 3.4*  CL 101 100  --  99 100 102  CO2 23 25  --  24 26 22   GLUCOSE 121* 105*  --  114* 92 92  BUN 15 12  --  11 15 9   CREATININE 0.70 0.64  --  0.75 0.76 0.59*  CALCIUM  8.7* 8.6*  --  8.4* 8.3* 8.2*  MG  --   --  1.9 1.9 2.0  --    Liver Function Tests: Recent Labs  Lab 06/12/24 1819  AST 19  ALT 12  ALKPHOS 64  BILITOT 0.8  PROT 6.5  ALBUMIN  3.1*   No results for input(s): LIPASE, AMYLASE in the last 168 hours. No results for input(s): AMMONIA in the last 168 hours. Coagulation Profile: Recent Labs  Lab 06/12/24 1819  INR 1.1   CBC: Recent  Labs  Lab 06/12/24 1819 06/13/24 0454 06/14/24 0426 06/15/24 0306 06/16/24 0230 06/17/24 0312  WBC 14.0* 14.0* 14.2* 11.3* 14.0* 11.5*  NEUTROABS 11.8*  --   --   --   --   --   HGB 10.8* 10.8* 10.5* 9.7* 11.3* 10.1*  HCT 32.8* 32.5* 31.0* 30.2* 35.0* 29.8*  MCV 89.1 89.0 90.9 91.2 91.9 90.0  PLT 176 177 207 188 229 204   Cardiac Enzymes: Recent Labs  Lab 06/13/24 0937  CKTOTAL 12*   BNP: Invalid input(s): POCBNP CBG: No results for input(s): GLUCAP in the last 168 hours. HbA1C: No results for input(s): HGBA1C in the last 72 hours. Urine analysis:    Component Value Date/Time   COLORURINE YELLOW 11/02/2023 0057   APPEARANCEUR CLEAR 11/02/2023 0057   LABSPEC 1.008 11/02/2023 0057   PHURINE 7.0 11/02/2023 0057   GLUCOSEU NEGATIVE 11/02/2023 0057   HGBUR MODERATE (A) 11/02/2023 0057   BILIRUBINUR NEGATIVE 11/02/2023 0057   KETONESUR NEGATIVE 11/02/2023 0057   PROTEINUR NEGATIVE 11/02/2023 0057   NITRITE NEGATIVE 11/02/2023 0057   LEUKOCYTESUR MODERATE (A) 11/02/2023 0057   Sepsis Labs: @LABRCNTIP (procalcitonin:4,lacticidven:4) ) Recent Results (from the past 240 hours)  Culture, blood (Routine X 2) w Reflex to ID Panel     Status: Abnormal   Collection Time: 06/12/24 10:55 PM   Specimen: BLOOD  Result Value Ref Range Status   Specimen Description   Final    BLOOD BLOOD LEFT ARM Performed at [facility], [address] [place], [place] [address]    Special Requests   Final    BOTTLES DRAWN AEROBIC AND ANAEROBIC Blood Culture adequate volume Performed at [facility], [address], [place], [place] [address]    Culture  Setup Time   Final    GRAM POSITIVE COCCI IN BOTH AEROBIC AND ANAEROBIC BOTTLES CRITICAL VALUE NOTED.  VALUE IS CONSISTENT WITH PREVIOUSLY REPORTED AND CALLED VALUE. JESSICA ELLER AT 2315 06/13/24 BY A. SNYDER GRAM STAIN REVIEWED-AGREE WITH RESULT    Culture (A)  Final    STAPHYLOCOCCUS EPIDERMIDIS SUSCEPTIBILITIES PERFORMED ON  PREVIOUS CULTURE WITHIN THE LAST 5 DAYS. Performed at [facility], [address], [place], [place] [address]    Report Status 06/17/2024 FINAL  Final  Culture, blood (Routine X 2) w Reflex to ID Panel     Status: Abnormal   Collection Time: 06/12/24 10:57 PM   Specimen: BLOOD LEFT HAND  Result Value Ref Range Status   Specimen Description   Final    BLOOD LEFT HAND Performed at [facility], [address],  [place], [place] [address]    Special Requests   Final    BOTTLES DRAWN AEROBIC AND ANAEROBIC Blood Culture adequate volume Performed at [facility], [address], [place], [place] [address]    Culture  Setup Time   Final    GRAM POSITIVE COCCI IN BOTH AEROBIC AND ANAEROBIC BOTTLES Gram Stain Report Called to,Read Back By and Verified With: JESSICA ELLER,RN AT 2315 06/13/24 BY A. SNYDER CRITICAL RESULT CALLED TO, READ BACK BY AND VERIFIED WITH: LOISE MORONES RN 06/14/2024 @ 0414 BY AB GRAM STAIN REVIEWED-AGREE WITH RESULT Performed at [facility], [address], [place], [place] [address]    Culture STAPHYLOCOCCUS EPIDERMIDIS (A)  Final   Report Status 06/17/2024 FINAL  Final   Organism ID, Bacteria STAPHYLOCOCCUS EPIDERMIDIS  Final      Susceptibility   Staphylococcus epidermidis - MIC*    CIPROFLOXACIN  >=8 RESISTANT Resistant     ERYTHROMYCIN >=8 RESISTANT Resistant     GENTAMICIN <=0.5 SENSITIVE Sensitive     OXACILLIN >=4 RESISTANT Resistant     TETRACYCLINE >=16 RESISTANT Resistant     VANCOMYCIN  <=0.5 SENSITIVE Sensitive     TRIMETH/SULFA 160 RESISTANT Resistant     CLINDAMYCIN >=8 RESISTANT Resistant     RIFAMPIN <=0.5 SENSITIVE Sensitive     Inducible Clindamycin NEGATIVE Sensitive     * STAPHYLOCOCCUS EPIDERMIDIS  Blood Culture ID Panel (Reflexed)     Status: Abnormal   Collection Time: 06/12/24 10:57 PM  Result Value Ref Range Status   Enterococcus faecalis NOT DETECTED NOT DETECTED Final   Enterococcus Faecium NOT DETECTED NOT DETECTED Final    Listeria monocytogenes NOT DETECTED NOT DETECTED Final   Staphylococcus species DETECTED (A) NOT DETECTED Final    Comment: CRITICAL RESULT CALLED TO, READ BACK BY AND VERIFIED WITH: N BAUGH RN 06/14/2024 @ 0414 BY AB    Staphylococcus aureus (BCID) NOT DETECTED NOT DETECTED Final   Staphylococcus epidermidis DETECTED (A) NOT DETECTED Final    Comment: Methicillin (oxacillin) resistant coagulase negative staphylococcus. Possible blood culture contaminant (unless isolated from more than one blood culture draw or clinical case suggests pathogenicity). No antibiotic treatment is indicated for blood  culture contaminants. CRITICAL RESULT CALLED TO, READ BACK BY AND VERIFIED WITH: N BAUGH RN 06/14/2024 @ 0414 BY AB    Staphylococcus lugdunensis NOT DETECTED NOT DETECTED Final   Streptococcus species NOT DETECTED NOT DETECTED Final   Streptococcus agalactiae NOT DETECTED NOT DETECTED Final   Streptococcus pneumoniae NOT DETECTED NOT DETECTED Final   Streptococcus pyogenes NOT DETECTED NOT DETECTED Final   A.calcoaceticus-baumannii NOT DETECTED NOT DETECTED Final   Bacteroides fragilis NOT DETECTED NOT DETECTED Final   Enterobacterales NOT DETECTED NOT DETECTED Final   Enterobacter cloacae complex NOT DETECTED NOT DETECTED Final   Escherichia coli NOT DETECTED NOT DETECTED Final   Klebsiella aerogenes NOT DETECTED NOT DETECTED Final   Klebsiella oxytoca NOT DETECTED NOT DETECTED Final   Klebsiella pneumoniae NOT DETECTED NOT DETECTED Final   Proteus species NOT DETECTED NOT DETECTED Final   Salmonella species NOT DETECTED NOT DETECTED Final   Serratia marcescens NOT DETECTED NOT DETECTED Final   Haemophilus influenzae NOT DETECTED NOT DETECTED Final   Neisseria meningitidis NOT DETECTED NOT DETECTED Final   Pseudomonas aeruginosa NOT DETECTED NOT DETECTED Final   Stenotrophomonas maltophilia NOT DETECTED NOT DETECTED Final   Candida albicans NOT DETECTED NOT DETECTED Final   Candida auris  NOT DETECTED NOT DETECTED Final   Candida glabrata NOT DETECTED NOT  DETECTED Final   Candida krusei NOT DETECTED NOT DETECTED Final   Candida parapsilosis NOT DETECTED NOT DETECTED Final   Candida tropicalis NOT DETECTED NOT DETECTED Final   Cryptococcus neoformans/gattii NOT DETECTED NOT DETECTED Final   Methicillin resistance mecA/C DETECTED (A) NOT DETECTED Final    Comment: CRITICAL RESULT CALLED TO, READ BACK BY AND VERIFIED WITHBETHA LOISE MORONES RN 06/14/2024 @ 0414 BY AB Performed at [facility], [address], [place], [place] [address]   Urine Culture     Status: Abnormal   Collection Time: 06/13/24  1:20 PM   Specimen: Urine, Clean Catch  Result Value Ref Range Status   Specimen Description   Final    URINE, CLEAN CATCH Performed at [facility], [address], [place], [place] [address]    Special Requests   Final    NONE Performed at [facility], [address] [place], [place] [address]    Culture (A)  Final    <10,000 COLONIES/mL INSIGNIFICANT GROWTH Performed at [facility], [address], [place], [place] [address]    Report Status 06/14/2024 FINAL  Final  Culture, blood (Routine X 2) w Reflex to ID Panel     Status: Abnormal   Collection Time: 06/14/24  7:44 AM   Specimen: BLOOD  Result Value Ref Range Status   Specimen Description   Final    BLOOD RIGHT ANTECUBITAL Performed at [facility], [address], [place], [place] [address]    Special Requests   Final    BOTTLES DRAWN AEROBIC AND ANAEROBIC Blood Culture adequate volume Performed at [facility], [address], [place], [place] [address]    Culture  Setup Time   Final    GRAM POSITIVE COCCI IN BOTH AEROBIC AND ANAEROBIC BOTTLES Gram Stain Report Called to,Read Back By and Verified With: DSABRA MOLT ON 06/15/2024 @12 :50 BY T.HAMER Performed at [facility], [address] [place], [place] [address]    Culture (A)  Final    STAPHYLOCOCCUS EPIDERMIDIS SUSCEPTIBILITIES PERFORMED ON PREVIOUS  CULTURE WITHIN THE LAST 5 DAYS. Performed at [facility], [address] [place], [place] [address]    Report Status 06/17/2024 FINAL  Final  Culture, blood (Routine X 2) w Reflex to ID Panel     Status: Abnormal   Collection Time: 06/14/24  7:44 AM   Specimen: BLOOD  Result Value Ref Range Status   Specimen Description   Final    BLOOD LEFT ANTECUBITAL Performed at [facility], [address], [place], [place] [address]    Special Requests   Final    BOTTLES DRAWN AEROBIC AND ANAEROBIC Blood Culture adequate volume Performed at [facility], [address], [place], [place] [address]    Culture  Setup Time   Final    GRAM POSITIVE COCCI IN BOTH AEROBIC AND ANAEROBIC BOTTLES Gram Stain Report Called to,Read Back By and Verified With: B. ROBERTSON ON 06/15/2024 @13 :49 BY T.HAMER Performed at [facility], [address], [place], [place] [address]    Culture (A)  Final    STAPHYLOCOCCUS EPIDERMIDIS SUSCEPTIBILITIES PERFORMED ON PREVIOUS CULTURE WITHIN THE LAST 5 DAYS. Performed at [facility], [address], [place], [place] [address]    Report Status 06/17/2024 FINAL  Final  Culture, blood (Routine X 2) w Reflex to ID Panel     Status: None (Preliminary result)   Collection Time: 06/16/24  2:30 AM   Specimen: BLOOD LEFT ARM  Result  Value Ref Range Status   Specimen Description BLOOD LEFT ARM  Final   Special Requests AEROBIC BOTTLE ONLY Blood Culture adequate volume  Final   Culture   Final    NO GROWTH < 12 HOURS Performed at [facility], [address], [place], [place] [address]    Report Status PENDING  Incomplete  Culture, blood (Routine X 2) w Reflex to ID Panel     Status: None (Preliminary result)   Collection Time: 06/16/24  2:35 AM   Specimen: Right Antecubital; Blood  Result Value Ref Range Status   Specimen Description RIGHT ANTECUBITAL  Final   Special Requests   Final    BOTTLES DRAWN AEROBIC AND ANAEROBIC Blood Culture adequate volume    Culture   Final    NO GROWTH < 12 HOURS Performed at [facility], [address], [place], [place] [address]    Report Status PENDING  Incomplete     Scheduled Meds:  aspirin   81 mg Oral Daily   finasteride   5 mg Oral QPM   pantoprazole  (PROTONIX ) IV  40 mg Intravenous Q24H   rosuvastatin   40 mg Oral Daily   Continuous Infusions:  [START ON 06/18/2024] vancomycin       Procedures/Studies: ECHOCARDIOGRAM COMPLETE Result Date: 06/14/2024    ECHOCARDIOGRAM REPORT   Patient Name:   KALLUM JORGENSEN Date of Exam: 06/13/2024 Medical Rec #:  [id number]  Height:       62.0 in Accession #:    [id number] Weight:       153.2 lb Date of Birth:  1946-02-27   BSA:          1.707 m Patient Age:    77 years   BP:           118/71 mmHg Patient Gender: M          HR:           88 bpm. Exam Location:  [facility] Procedure: 2D Echo, Cardiac Doppler and Color Doppler (Both Spectral and Color            Flow Doppler were utilized during procedure). Indications:    Chest Pain  History:        Patient has prior history of Echocardiogram examinations. Risk                 Factors:Hypertension.  Sonographer:    Vella Key Referring Phys: [id number] EJIROGHENE E EMOKPAE IMPRESSIONS  1. Left ventricular ejection fraction, by estimation, is 55 to 60%. The left ventricle has normal function. There is mild concentric left ventricular hypertrophy. Indeterminate diastolic filling due to E-A fusion.  2. Right ventricular systolic function is normal. The right ventricular size is normal.  3. Right atrial size was moderately dilated.  4. Mild mitral valve regurgitation.  5. Tricuspid valve regurgitation is moderate to severe.  6. S/p AVR (23 mm Inspiris Resilia prosthesis; procedure date 11/02/23). Peak and mean gradients through the valve are 46 and 28 mm Hg respectively Acceleration time is 89 msec Ejection time is 209 msec (AT/ET is 0.39) DVI is 0.39. Compared to echo report from December 2024, mean gradient is increased (12 to 28 mm Hg) and DVI is  decreased (0.55 to 0.41) Cannot exclude obstruction to prosthesis.     Consider TEE to evaluate furtherr.. The aortic valve has been repaired/replaced. Aortic valve regurgitation is not visualized. Aortic valve sclerosis/calcification is present, without any evidence of aortic stenosis. FINDINGS  Left Ventricle: Left ventricular ejection fraction, by  estimation, is 55 to 60%. The left ventricle has normal function. The left ventricular internal cavity size was normal in size. There is mild concentric left ventricular hypertrophy. Indeterminate diastolic filling due to E-A fusion. Right Ventricle: The right ventricular size is normal. Right vetricular wall thickness was not assessed. Right ventricular systolic function is normal. Left Atrium: Left atrial size was normal in size. Right Atrium: Right atrial size was moderately dilated. Pericardium: There is no evidence of pericardial effusion. Mitral Valve: There is mild thickening of the mitral valve leaflet(s). Mild mitral annular calcification. Mild mitral valve regurgitation. Tricuspid Valve: The tricuspid valve is normal in structure. Tricuspid valve regurgitation is moderate to severe. Aortic Valve: S/p AVR (23 mm Inspiris Resilia prosthesis; procedure date 11/02/23). Peak and mean gradients through the valve are 46 and 28 mm Hg respectively Acceleration time is 89 msec Ejection time is 209 msec (AT/ET is 0.39) DVI is 0.39. Compared to  echo report from December 2024, mean gradient is increased (12 to 28 mm Hg) and DVI is decreased (0.55 to 0.41) Cannot exclude obstruction to prosthesis. Consider TEE to evaluate furtherr. The aortic valve has been repaired/replaced. Aortic valve regurgitation is not visualized. Aortic valve sclerosis/calcification is present, without any evidence of aortic stenosis. Aortic valve mean gradient measures 23.5 mmHg. Aortic valve peak gradient measures 37.6 mmHg. Aortic valve area, by VTI measures 0.93 cm. Pulmonic Valve: The  pulmonic valve was not well visualized. Pulmonic valve regurgitation is not visualized. Aorta: The aortic root is normal in size and structure. IAS/Shunts: No atrial level shunt detected by color flow Doppler.  LEFT VENTRICLE PLAX 2D LVIDd:         4.00 cm      Diastology LVIDs:         2.90 cm      LV e' medial:    7.40 cm/s LV PW:         1.20 cm      LV E/e' medial:  12.5 LV IVS:        1.20 cm      LV e' lateral:   9.36 cm/s LVOT diam:     1.70 cm      LV E/e' lateral: 9.9 LV SV:         49 LV SV Index:   29 LVOT Area:     2.27 cm  LV Volumes (MOD) LV vol d, MOD A2C: 104.0 ml LV vol d, MOD A4C: 154.0 ml LV vol s, MOD A2C: 52.1 ml LV vol s, MOD A4C: 69.7 ml LV SV MOD A2C:     51.9 ml LV SV MOD A4C:     154.0 ml LV SV MOD BP:      65.7 ml RIGHT VENTRICLE RV Basal diam:  5.00 cm RV S prime:     10.80 cm/s TAPSE (M-mode): 1.3 cm LEFT ATRIUM           Index        RIGHT ATRIUM           Index LA diam:      3.70 cm 2.17 cm/m   RA Area:     28.10 cm LA Vol (A2C): 94.7 ml 55.48 ml/m  RA Volume:   113.00 ml 66.20 ml/m LA Vol (A4C): 47.4 ml 27.77 ml/m  AORTIC VALVE AV Area (Vmax):    0.84 cm AV Area (Vmean):   0.81 cm AV Area (VTI):     0.93 cm AV Vmax:  306.50 cm/s AV Vmean:          230.500 cm/s AV VTI:            0.528 m AV Peak Grad:      37.6 mmHg AV Mean Grad:      23.5 mmHg LVOT Vmax:         114.00 cm/s LVOT Vmean:        82.200 cm/s LVOT VTI:          0.217 m LVOT/AV VTI ratio: 0.41  AORTA Ao Root diam: 2.90 cm MITRAL VALVE                TRICUSPID VALVE MV Area (PHT): 8.25 cm     TV Peak grad:   24.0 mmHg MV Decel Time: 92 msec      TV Vmax:        2.45 m/s MV E velocity: 92.20 cm/s   TR Peak grad:   24.0 mmHg MV A velocity: 132.00 cm/s  TR Vmax:        245.00 cm/s MV E/A ratio:  0.70                             SHUNTS                             Systemic VTI:  0.22 m                             Systemic Diam: 1.70 cm Vina Gull MD Electronically signed by Vina Gull MD Signature Date/Time:  06/14/2024/11:51:30 AM    Final    EEG adult Result Date: 06/14/2024 Shelton Arlin KIDD, MD     06/14/2024  7:41 AM Patient Name: Kyle Matthews MRN: [id number] Epilepsy Attending: Arlin KIDD Shelton Referring Physician/Provider: Evonnie Lenis, MD Date: 06/13/2024 Duration: 23.04 mins Patient history: 78yo M with episodes of shaking. EEG to evaluate for seizure. Level of alertness: Awake AEDs during EEG study: None Technical aspects: This EEG study was done with scalp electrodes positioned according to the 10-20 International system of electrode placement. Electrical activity was reviewed with band pass filter of 1-70Hz , sensitivity of 7 uV/mm, display speed of 20mm/sec with a 60Hz  notched filter applied as appropriate. EEG data were recorded continuously and digitally stored.  Video monitoring was available and reviewed as appropriate. Description: The posterior dominant rhythm consists of 8-9 Hz activity of moderate voltage (25-35 uV) seen predominantly in posterior head regions, symmetric and reactive to eye opening and eye closing. Physiologic photic driving was not seen during photic stimulation.  Hyperventilation was not performed.   IMPRESSION: This study is within normal limits. No seizures or epileptiform discharges were seen throughout the recording. A normal interictal EEG does not exclude the diagnosis of epilepsy. Priyanka KIDD Shelton   CT ANGIO NECK W OR WO CONTRAST Result Date: 06/13/2024 CLINICAL DATA:  Follow-up examination for stroke. EXAM: CT ANGIOGRAPHY NECK TECHNIQUE: Multidetector CT imaging of the neck was performed using the standard protocol during bolus administration of intravenous contrast. Multiplanar CT image reconstructions and MIPs were obtained to evaluate the vascular anatomy. Carotid stenosis measurements (when applicable) are obtained utilizing NASCET criteria, using the distal internal carotid diameter as the denominator. RADIATION DOSE REDUCTION: This exam was performed according to the  departmental dose-optimization program which includes automated exposure control, adjustment of the  mA and/or kV according to patient size and/or use of iterative reconstruction technique. CONTRAST:  75mL OMNIPAQUE  IOHEXOL  350 MG/ML SOLN COMPARISON:  Prior brain MRI from earlier the same day. FINDINGS: Aortic arch: Visualized aortic arch within normal limits for caliber with standard 3 vessel morphology. Moderate aortic atherosclerosis. No high-grade stenosis about the origin the great vessels. Right carotid system: Right common and internal carotid arteries are patent without dissection. Moderate calcified plaque about the right carotid bulb/proximal right ICA without hemodynamically significant greater than 50% stenosis. Left carotid system: Left common and internal carotid arteries are patent without dissection. Moderate calcified plaque about the left carotid bulb/proximal cervical left ICA without hemodynamically significant greater than 50% stenosis. Vertebral arteries: Both vertebral arteries arise from subclavian arteries. No significant proximal subclavian artery stenosis. Vertebral arteries M cells are patent without significant stenosis or dissection. Skeleton: No worrisome osseous lesions.  Prior sternotomy noted. Other neck: No other acute finding. Few small thyroid  nodules noted, largest of which measures 8 mm on the right. These are of doubtful significance given size and patient age, no follow-up imaging recommended (ref: J Am Coll Radiol. 2015 Feb;12(2): 143-50). Upper chest: Upper lobe predominant emphysema.  No acute finding. IMPRESSION: 1. Negative CTA for large vessel occlusion. 2. Moderate atherosclerotic change about the carotid bifurcations/proximal ICAs without hemodynamically significant greater than 50% stenosis. 3. Wide patency of both vertebral arteries within the neck. 4. Aortic Atherosclerosis (ICD10-I70.0) and Emphysema (ICD10-J43.9). Electronically Signed   By: Morene Hoard  M.D.   On: 06/13/2024 22:56   MR BRAIN WO CONTRAST Result Date: 06/13/2024 CLINICAL DATA:  seizure like activity EXAM: MRI HEAD WITHOUT CONTRAST MRA HEAD WITHOUT CONTRAST TECHNIQUE: Multiplanar, multi-echo pulse sequences of the brain and surrounding structures were acquired without intravenous contrast. Angiographic images of the Circle of Willis were acquired using MRA technique without intravenous contrast. COMPARISON:  CT head 15 2007. 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 No significant mass effect or midline shift. Additional patchy T2/FLAIR hyperintensity in the white matter compatible with microvascular ischemic disease. No evidence of acute hemorrhage, mass lesion, or hydrocephalus. Vascular: See below. Skull and upper cervical spine: Normal marrow signal. Sinuses/Orbits: Mostly clear sinuses.  No acute orbital findings. Other: No mastoid effusions. MRA HEAD FINDINGS Anterior circulation: Bilateral intracranial ICAs, MCAs, and ACAs are patent without proximal hemodynamically significant stenosis. Posterior circulation:Bilateral intradural vertebral arteries, basilar artery and bilateral posterior cerebral arteries are patent without proximally in amic least significant stenosis. IMPRESSION: 1. 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 2. No emergent large vessel occlusion or proximal hemodynamically significant stenosis. Electronically Signed   By: Gilmore GORMAN Molt M.D.   On: 06/13/2024 16:11   MR ANGIO HEAD WO CONTRAST Result Date: 06/13/2024 CLINICAL DATA:  seizure like activity EXAM: MRI HEAD WITHOUT CONTRAST MRA HEAD WITHOUT CONTRAST TECHNIQUE: Multiplanar, multi-echo pulse sequences of the brain and surrounding structures were acquired without intravenous contrast. Angiographic images of the Circle of Willis were acquired using MRA technique without intravenous contrast. COMPARISON:  CT head 15 2007. FINDINGS: MRI HEAD FINDINGS Brain: Small  acute infarct in the left occipital lobe. No significant mass effect or midline shift. Additional patchy T2/FLAIR hyperintensity in the white matter compatible with microvascular ischemic disease. No evidence of acute hemorrhage, mass lesion, or hydrocephalus. Vascular: See below. Skull and upper cervical spine: Normal marrow signal. Sinuses/Orbits: Mostly clear sinuses.  No acute orbital findings. Other: No mastoid effusions. MRA HEAD FINDINGS Anterior circulation: Bilateral intracranial  ICAs, MCAs, and ACAs are patent without proximal hemodynamically significant stenosis. Posterior circulation:Bilateral intradural vertebral arteries, basilar artery and bilateral posterior cerebral arteries are patent without proximally in amic least significant stenosis. IMPRESSION: 1. 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 2. No emergent large vessel occlusion or proximal hemodynamically significant stenosis. Electronically Signed   By: Gilmore GORMAN Molt M.D.   On: 06/13/2024 16:11   US  Venous Img Lower Bilateral Result Date: 06/13/2024 CLINICAL DATA:  Bilateral leg swelling. EXAM: BILATERAL LOWER EXTREMITY VENOUS DOPPLER ULTRASOUND TECHNIQUE: Gray-scale sonography with graded compression, as well as color Doppler and duplex ultrasound were performed to evaluate the lower extremity deep venous systems from the level of the common femoral vein and including the common femoral, femoral, profunda femoral, popliteal and calf veins including the posterior tibial, peroneal and gastrocnemius veins when visible. The superficial great saphenous vein was also interrogated. Spectral Doppler was utilized to evaluate flow at rest and with distal augmentation maneuvers in the common femoral, femoral and popliteal veins. COMPARISON:  None Available. FINDINGS: RIGHT LOWER EXTREMITY Common Femoral Vein: No evidence of thrombus. Normal compressibility, respiratory phasicity and response to augmentation. Saphenofemoral Junction: No  evidence of thrombus. Normal compressibility and flow on color Doppler imaging. Profunda Femoral Vein: No evidence of thrombus. Normal compressibility and flow on color Doppler imaging. Femoral Vein: No evidence of thrombus. Normal compressibility, respiratory phasicity and response to augmentation. Popliteal Vein: No evidence of thrombus. Normal compressibility, respiratory phasicity and response to augmentation. Calf Veins: No evidence of thrombus. Normal compressibility and flow on color Doppler imaging. Superficial Great Saphenous Vein: No evidence of thrombus. Normal compressibility. Other Findings:  None. LEFT LOWER EXTREMITY Common Femoral Vein: No evidence of thrombus. Normal compressibility, respiratory phasicity and response to augmentation. Saphenofemoral Junction: No evidence of thrombus. Normal compressibility and flow on color Doppler imaging. Profunda Femoral Vein: No evidence of thrombus. Normal compressibility and flow on color Doppler imaging. Femoral Vein: No evidence of thrombus. Normal compressibility, respiratory phasicity and response to augmentation. Popliteal Vein: No evidence of thrombus. Normal compressibility, respiratory phasicity and response to augmentation. Calf Veins: No evidence of thrombus. Normal compressibility and flow on color Doppler imaging. Superficial Great Saphenous Vein: No evidence of thrombus. Normal compressibility. Other Findings:  None. IMPRESSION: No evidence of deep venous thrombosis in either lower extremity. Electronically Signed   By: Juliene Balder M.D.   On: 06/13/2024 10:52   CT Angio Chest PE W and/or Wo Contrast Result Date: 06/12/2024 CLINICAL DATA:  Positive D-dimer and chest pain, initial encounter EXAM: CT ANGIOGRAPHY CHEST WITH CONTRAST TECHNIQUE: Multidetector CT imaging of the chest was performed using the standard protocol during bolus administration of intravenous contrast. Multiplanar CT image reconstructions and MIPs were obtained to evaluate the  vascular anatomy. RADIATION DOSE REDUCTION: This exam was performed according to the departmental dose-optimization program which includes automated exposure control, adjustment of the mA and/or kV according to patient size and/or use of iterative reconstruction technique. CONTRAST:  75mL OMNIPAQUE  IOHEXOL  350 MG/ML SOLN COMPARISON:  Chest x-ray from earlier in the same day. CT from 10/10/2023 FINDINGS: Cardiovascular: Atherosclerotic calcifications of the aorta are noted. No aneurysmal dilatation is seen. 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 Coronary calcifications are noted. The pulmonary artery shows a normal branching pattern bilaterally. No filling defect to suggest pulmonary embolism is noted. Mediastinum/Nodes: Thoracic inlet is within normal limits. No hilar or mediastinal adenopathy is noted. The esophagus as visualized is within normal limits. Lungs/Pleura: Lungs are well aerated bilaterally.  Mild emphysematous changes are seen. Parenchymal nodule is seen. No infiltrate or effusion is noted. Upper Abdomen: Visualized upper abdomen demonstrates stable simple cyst within the right lobe of the liver. Musculoskeletal: Degenerative changes of the thoracic spine are seen. No acute bony abnormality is noted. Review of the MIP images confirms the above findings. IMPRESSION: No evidence of pulmonary embolism. No acute abnormality seen. Aortic Atherosclerosis (ICD10-I70.0) and Emphysema (ICD10-J43.9). Electronically Signed   By: Oneil Devonshire M.D.   On: 06/12/2024 19:24   DG Chest Port 1 View Result Date: 06/12/2024 CLINICAL DATA:  Chest pain EXAM: PORTABLE CHEST 1 VIEW COMPARISON:  05/07/2024 FINDINGS: Prior CABG and valve replacement. Heart and mediastinal contours are within normal limits. No focal opacities or effusions. No acute bony abnormality. Aortic atherosclerosis. IMPRESSION: No active cardiopulmonary disease. Electronically Signed   By: Franky Crease M.D.   On: 06/12/2024 18:05     Alm Schneider, DO  [facility]  If 7PM-7AM, please contact night-coverage [email or web address] Password [email or web address] 06/17/2024, 11:27 AM   LOS: 2 days

## 2024-06-17 NOTE — Progress Notes (Signed)
 Patient discussed with primary team. 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 Patient had breakfast this morning. We are unable to facilitate a TEE at [facility] any other days this week, will need transfer to [facility] to have study arranged for tomorrow.   Dorn Ross MD

## 2024-06-17 NOTE — H&P (View-Only) (Signed)
 PROGRESS NOTE  Kyle Matthews FMW:[id number] DOB: 1946/07/26 DOA: 06/12/2024 PCP: Verena Mems, MD  Brief History:  78 year old male with a history of coronary artery disease status post CABG 10/2023, AS s/p bioprosthetic AVR, diastolic dysfunction, stroke, postoperative atrial fibrillation post CABG, HTN, HLD, BPH with urinary retention status post robotic assisted laparoscopic simple prostatectomy 05/22/2024, hypertension, hyperlipidemia, presenting with midsternal chest discomfort subjective chills and rigors. Regarding the chest discomfort, the patient stated that the sensation along his mid sternum evening of 06/11/2024 while watching television.  He went to bed and woke up on the morning of 06/12/2024 with the same burning midsternal chest discomfort.  Around noon time he had an episode of chills and shaking which resulted in an episode of nausea and vomiting.  This was after he ate some cereal.  His chest discomfort continued.  As result, he presented for further evaluation and treatment. He denies any headache, neck pain coughing, hemoptysis, hematemesis, pain, dysuria, hematuria, hematochezia, melena.  Regarding his chills and shaking episodes, the patient states that he has never had a temperature greater than 98.9 Fahrenheit.  He states the episodes usually last about 15- 20 minutes.  He feels cold and wraps himself up in the blanket.  He states that he is able to somewhat suppress his shaking, but is no completely suppressed.  He does not lose consciousness.  He states his bilateral arms> bilateral legs shake.  He is able to speak and communicate during these episodes.  He states that he has had these intermittent episodes since his CABG 10/2023.  However he has had 4 episodes past 2 to 3 weeks.  He denies any headache, visual disturbance, focal extremity weakness coughing,, dysuria, hematuria.  Notably, the patient went to see his outpatient provider on 06/06/2024 he was placed on  ciprofloxacin  empirically.  The patient states that he took 6 days.  His last dose was 06/11/24.  The patient elected temperature 99.0 F.  He was initially tachycardic up to 122.  Saturation was 97% room air.  WBC 14.0, hemoglobin 10.8, platelets 177.  Sodium 134, potassium 3.7, bicarbonate 23, serum creatinine 0.70. Cardiology was consulted to assist with management.   In the ED, the patient had a low-grade temperature 1 0 F.  He was hemodynamically stable.  Oxygen saturation WBC 14.0, platelets 176.  BMP showed sodium 134, potassium 3.7, bicarbonate 23, serum creatinine 0.70 LFTs unremarkable.  BNP 169.0.  99>> 86.  EKG showed sinus rhythm with right bundle branch block.  CTA chest was negative for PE.  There is mild emphysematous change.  There is no infiltrates or effusions. Patient was initially placed on IV heparin  which was stopped after 18 hours after ACS and thromboembolic events were ruled out.   He developed hematuria which has overall improved.   Blood cultures grew MRSE in 4/4 sets.  He was started on IV vancomycin .  TTE showed some abnormality with the AV gradient.  Plan it to obtain TEE (unavailable at [facility] this week).  ID has been consulted and Dr. Fleeta Rothman will see.   Assessment/Plan:  Chest pain/elevated troponin -troponins flat 99>>86 -cardiology consult appreciated -echo-EF 55-60%, normal RVF, mod-severe TR, mild MR--mean gradient is increased (12 to 28 mm Hg) and  DVI is decreased (0.55 to 0.41) Cannot exclude obstruction to prosthesis.  -continue aspirin  and statin - No further chest pain   SIRS/Chills/Rigors/Shaking -Patient presented with leukocytosis and tachycardia - EEG--no seizure - 6/25 blood culture--Staph  epi 2/2 sets - Check lactic acid 1.1 - Check PCT <0.10 - The patient has been on ciprofloxacin  which may compromise cultures - random cortisol--12.4 - MR brain--Small acute infarct in the left occipital lobe  - TSH 0.937 -urine culture < 10 K organisms -  overall improved   Staph Epi Bacteremia -- 6/25 blood culture--Staph epi 2/2 sets -repeat blood culture 6/27--GPC 2/2 sets -started empiric vanc 6/27>> -repeat blood cultures in am 6/29--neg to date -ID consulted--Dr. Fleeta Rothman is aware   Acute Ischemic Stroke -Appreciate Neurology Consult -incidental finding -PT/OT evaluation--no PT follow up -MRI brain--Small acute infarct in the left occipital lobe  -MRA brain--No emergent large vessel occlusion or proximal hemodynamically significant stenosis -CTA neck--no LVO; patent carotids and vertebral arteries -Echo--EF 55-60%, normal RVF, mod-severe TR, mild MR--mean gradient is increased (12 to 28 mm Hg) and  DVI is decreased (0.55 to 0.41) Cannot exclude obstruction to prosthesis.  -LDL--66 -HbA1C--5.1 -Antiplatelet--Aspirin  81 mg daily -06/14/24--discussed with Dr. Shelton   Hematuria -started after IV heparin  (6/25-6/26) -stopped IV heparin  after about 18 hours -discussed with urology, Dr. Alvaro -expects bleeding to gradually slowed down although may continue to have waxing and waning episodes of worsening -IV heparin  stopped - Patient is able to urinate freely -Continue finasteride  -overall has improved, Hgb remains stable   BPH -s/p laparoscopic prostatectomy 05/22/2024 - Foley catheter was removed 06/03/2024 - Able to urinate without difficulty - continue finasteride    Mixed Hyperlipidemia -continue statin   Elevated D-dimer -CTA chest neg for PE -venous duplex legs--neg   Essential HTN -hold amlodipine  temporarily -BP remains well-controlled   Aortic Stenosis -s/p bioprosthetic valve -discussed with Dr. Jessie echo -plan for TEE at [facility]--Cardiology is aware                     Family Communication:   spouse 6/30   Consultants:  cardiology   Code Status:  FULL    DVT Prophylaxis:  SCDs     Procedures: As Listed in Progress Note Above   Antibiotics: Vanc 6/27>>             Subjective: Patient denies fevers, chills, headache, chest pain, dyspnea, nausea, vomiting, diarrhea, abdominal pain, dysuria,  hematochezia, and melena.  Urine is almost completely clear   Objective: Vitals:   06/16/24 0624 06/16/24 1245 06/16/24 2003 06/17/24 0406  BP: 100/67 109/67 130/70 125/75  Pulse: 89 79 74 82  Resp:      Temp: 98.6 F (37 C) 99.1 F (37.3 C) 99 F (37.2 C) 99.6 F (37.6 C)  TempSrc: Oral Oral Oral Oral  SpO2: 97% 96% 98% 95%  Weight:      Height:        Intake/Output Summary (Last 24 hours) at 06/17/2024 1127 Last data filed at 06/17/2024 0847 Gross per 24 hour  Intake 720 ml  Output --  Net 720 ml   Weight change:  Exam:  General:  Pt is alert, follows commands appropriately, not in acute distress HEENT: No icterus, No thrush, No neck mass, [place]/AT Cardiovascular: RRR, S1/S2, no rubs, no gallops Respiratory: CTA bilaterally, no wheezing, no crackles, no rhonchi Abdomen: Soft/+BS, non tender, non distended, no guarding Extremities: No edema, No lymphangitis, No petechiae, No rashes, no synovitis   Data Reviewed: I have personally reviewed following labs and imaging studies Basic Metabolic Panel: Recent Labs  Lab 06/12/24 1819 06/13/24 0454 06/13/24 9062 06/14/24 0426 06/15/24 0306 06/17/24 0312  NA 134* 135  --  132* 134* 134*  K 3.7 3.1*  --  3.8 3.8 3.4*  CL 101 100  --  99 100 102  CO2 23 25  --  24 26 22   GLUCOSE 121* 105*  --  114* 92 92  BUN 15 12  --  11 15 9   CREATININE 0.70 0.64  --  0.75 0.76 0.59*  CALCIUM  8.7* 8.6*  --  8.4* 8.3* 8.2*  MG  --   --  1.9 1.9 2.0  --    Liver Function Tests: Recent Labs  Lab 06/12/24 1819  AST 19  ALT 12  ALKPHOS 64  BILITOT 0.8  PROT 6.5  ALBUMIN  3.1*   No results for input(s): LIPASE, AMYLASE in the last 168 hours. No results for input(s): AMMONIA in the last 168 hours. Coagulation Profile: Recent Labs  Lab 06/12/24 1819  INR 1.1   CBC: Recent  Labs  Lab 06/12/24 1819 06/13/24 0454 06/14/24 0426 06/15/24 0306 06/16/24 0230 06/17/24 0312  WBC 14.0* 14.0* 14.2* 11.3* 14.0* 11.5*  NEUTROABS 11.8*  --   --   --   --   --   HGB 10.8* 10.8* 10.5* 9.7* 11.3* 10.1*  HCT 32.8* 32.5* 31.0* 30.2* 35.0* 29.8*  MCV 89.1 89.0 90.9 91.2 91.9 90.0  PLT 176 177 207 188 229 204   Cardiac Enzymes: Recent Labs  Lab 06/13/24 0937  CKTOTAL 12*   BNP: Invalid input(s): POCBNP CBG: No results for input(s): GLUCAP in the last 168 hours. HbA1C: No results for input(s): HGBA1C in the last 72 hours. Urine analysis:    Component Value Date/Time   COLORURINE YELLOW 11/02/2023 0057   APPEARANCEUR CLEAR 11/02/2023 0057   LABSPEC 1.008 11/02/2023 0057   PHURINE 7.0 11/02/2023 0057   GLUCOSEU NEGATIVE 11/02/2023 0057   HGBUR MODERATE (A) 11/02/2023 0057   BILIRUBINUR NEGATIVE 11/02/2023 0057   KETONESUR NEGATIVE 11/02/2023 0057   PROTEINUR NEGATIVE 11/02/2023 0057   NITRITE NEGATIVE 11/02/2023 0057   LEUKOCYTESUR MODERATE (A) 11/02/2023 0057   Sepsis Labs: @LABRCNTIP (procalcitonin:4,lacticidven:4) ) Recent Results (from the past 240 hours)  Culture, blood (Routine X 2) w Reflex to ID Panel     Status: Abnormal   Collection Time: 06/12/24 10:55 PM   Specimen: BLOOD  Result Value Ref Range Status   Specimen Description   Final    BLOOD BLOOD LEFT ARM Performed at [facility], [address], [place], [place] [address]    Special Requests   Final    BOTTLES DRAWN AEROBIC AND ANAEROBIC Blood Culture adequate volume Performed at [facility], [address] [place], [place] [address]    Culture  Setup Time   Final    GRAM POSITIVE COCCI IN BOTH AEROBIC AND ANAEROBIC BOTTLES CRITICAL VALUE NOTED.  VALUE IS CONSISTENT WITH PREVIOUSLY REPORTED AND CALLED VALUE. JESSICA ELLER AT 2315 06/13/24 BY A. SNYDER GRAM STAIN REVIEWED-AGREE WITH RESULT    Culture (A)  Final    STAPHYLOCOCCUS EPIDERMIDIS SUSCEPTIBILITIES PERFORMED ON  PREVIOUS CULTURE WITHIN THE LAST 5 DAYS. Performed at [facility], [address], [place], [place] [address]    Report Status 06/17/2024 FINAL  Final  Culture, blood (Routine X 2) w Reflex to ID Panel     Status: Abnormal   Collection Time: 06/12/24 10:57 PM   Specimen: BLOOD LEFT HAND  Result Value Ref Range Status   Specimen Description   Final    BLOOD LEFT HAND Performed at [facility], [address],  [place], [place] [address]    Special Requests   Final    BOTTLES DRAWN AEROBIC AND ANAEROBIC Blood Culture adequate volume Performed at [facility], [address] [place], [place] [address]    Culture  Setup Time   Final    GRAM POSITIVE COCCI IN BOTH AEROBIC AND ANAEROBIC BOTTLES Gram Stain Report Called to,Read Back By and Verified With: JESSICA ELLER,RN AT 2315 06/13/24 BY A. SNYDER CRITICAL RESULT CALLED TO, READ BACK BY AND VERIFIED WITH: LOISE MORONES RN 06/14/2024 @ 0414 BY AB GRAM STAIN REVIEWED-AGREE WITH RESULT Performed at [facility], [address], [place], [place] [address]    Culture STAPHYLOCOCCUS EPIDERMIDIS (A)  Final   Report Status 06/17/2024 FINAL  Final   Organism ID, Bacteria STAPHYLOCOCCUS EPIDERMIDIS  Final      Susceptibility   Staphylococcus epidermidis - MIC*    CIPROFLOXACIN  >=8 RESISTANT Resistant     ERYTHROMYCIN >=8 RESISTANT Resistant     GENTAMICIN <=0.5 SENSITIVE Sensitive     OXACILLIN >=4 RESISTANT Resistant     TETRACYCLINE >=16 RESISTANT Resistant     VANCOMYCIN  <=0.5 SENSITIVE Sensitive     TRIMETH/SULFA 160 RESISTANT Resistant     CLINDAMYCIN >=8 RESISTANT Resistant     RIFAMPIN <=0.5 SENSITIVE Sensitive     Inducible Clindamycin NEGATIVE Sensitive     * STAPHYLOCOCCUS EPIDERMIDIS  Blood Culture ID Panel (Reflexed)     Status: Abnormal   Collection Time: 06/12/24 10:57 PM  Result Value Ref Range Status   Enterococcus faecalis NOT DETECTED NOT DETECTED Final   Enterococcus Faecium NOT DETECTED NOT DETECTED Final    Listeria monocytogenes NOT DETECTED NOT DETECTED Final   Staphylococcus species DETECTED (A) NOT DETECTED Final    Comment: CRITICAL RESULT CALLED TO, READ BACK BY AND VERIFIED WITH: N BAUGH RN 06/14/2024 @ 0414 BY AB    Staphylococcus aureus (BCID) NOT DETECTED NOT DETECTED Final   Staphylococcus epidermidis DETECTED (A) NOT DETECTED Final    Comment: Methicillin (oxacillin) resistant coagulase negative staphylococcus. Possible blood culture contaminant (unless isolated from more than one blood culture draw or clinical case suggests pathogenicity). No antibiotic treatment is indicated for blood  culture contaminants. CRITICAL RESULT CALLED TO, READ BACK BY AND VERIFIED WITH: N BAUGH RN 06/14/2024 @ 0414 BY AB    Staphylococcus lugdunensis NOT DETECTED NOT DETECTED Final   Streptococcus species NOT DETECTED NOT DETECTED Final   Streptococcus agalactiae NOT DETECTED NOT DETECTED Final   Streptococcus pneumoniae NOT DETECTED NOT DETECTED Final   Streptococcus pyogenes NOT DETECTED NOT DETECTED Final   A.calcoaceticus-baumannii NOT DETECTED NOT DETECTED Final   Bacteroides fragilis NOT DETECTED NOT DETECTED Final   Enterobacterales NOT DETECTED NOT DETECTED Final   Enterobacter cloacae complex NOT DETECTED NOT DETECTED Final   Escherichia coli NOT DETECTED NOT DETECTED Final   Klebsiella aerogenes NOT DETECTED NOT DETECTED Final   Klebsiella oxytoca NOT DETECTED NOT DETECTED Final   Klebsiella pneumoniae NOT DETECTED NOT DETECTED Final   Proteus species NOT DETECTED NOT DETECTED Final   Salmonella species NOT DETECTED NOT DETECTED Final   Serratia marcescens NOT DETECTED NOT DETECTED Final   Haemophilus influenzae NOT DETECTED NOT DETECTED Final   Neisseria meningitidis NOT DETECTED NOT DETECTED Final   Pseudomonas aeruginosa NOT DETECTED NOT DETECTED Final   Stenotrophomonas maltophilia NOT DETECTED NOT DETECTED Final   Candida albicans NOT DETECTED NOT DETECTED Final   Candida auris  NOT DETECTED NOT DETECTED Final   Candida glabrata NOT DETECTED NOT  DETECTED Final   Candida krusei NOT DETECTED NOT DETECTED Final   Candida parapsilosis NOT DETECTED NOT DETECTED Final   Candida tropicalis NOT DETECTED NOT DETECTED Final   Cryptococcus neoformans/gattii NOT DETECTED NOT DETECTED Final   Methicillin resistance mecA/C DETECTED (A) NOT DETECTED Final    Comment: CRITICAL RESULT CALLED TO, READ BACK BY AND VERIFIED WITHBETHA LOISE MORONES RN 06/14/2024 @ 0414 BY AB Performed at [facility], [address] [place], [place] [address]   Urine Culture     Status: Abnormal   Collection Time: 06/13/24  1:20 PM   Specimen: Urine, Clean Catch  Result Value Ref Range Status   Specimen Description   Final    URINE, CLEAN CATCH Performed at [facility], [address], [place], [place] [address]    Special Requests   Final    NONE Performed at [facility], [address], [place], [place] [address]    Culture (A)  Final    <10,000 COLONIES/mL INSIGNIFICANT GROWTH Performed at [facility], [address], [place], [place] [address]    Report Status 06/14/2024 FINAL  Final  Culture, blood (Routine X 2) w Reflex to ID Panel     Status: Abnormal   Collection Time: 06/14/24  7:44 AM   Specimen: BLOOD  Result Value Ref Range Status   Specimen Description   Final    BLOOD RIGHT ANTECUBITAL Performed at [facility], [address] [place], [place] [address]    Special Requests   Final    BOTTLES DRAWN AEROBIC AND ANAEROBIC Blood Culture adequate volume Performed at [facility], [address] [place], [place] [address]    Culture  Setup Time   Final    GRAM POSITIVE COCCI IN BOTH AEROBIC AND ANAEROBIC BOTTLES Gram Stain Report Called to,Read Back By and Verified With: DSABRA MOLT ON 06/15/2024 @12 :50 BY T.HAMER Performed at [facility], [address] [place], [place] [address]    Culture (A)  Final    STAPHYLOCOCCUS EPIDERMIDIS SUSCEPTIBILITIES PERFORMED ON PREVIOUS  CULTURE WITHIN THE LAST 5 DAYS. Performed at [facility], [address] [place], [place] [address]    Report Status 06/17/2024 FINAL  Final  Culture, blood (Routine X 2) w Reflex to ID Panel     Status: Abnormal   Collection Time: 06/14/24  7:44 AM   Specimen: BLOOD  Result Value Ref Range Status   Specimen Description   Final    BLOOD LEFT ANTECUBITAL Performed at [facility], [address], [place], [place] [address]    Special Requests   Final    BOTTLES DRAWN AEROBIC AND ANAEROBIC Blood Culture adequate volume Performed at [facility], [address], [place], [place] [address]    Culture  Setup Time   Final    GRAM POSITIVE COCCI IN BOTH AEROBIC AND ANAEROBIC BOTTLES Gram Stain Report Called to,Read Back By and Verified With: B. ROBERTSON ON 06/15/2024 @13 :49 BY T.HAMER Performed at [facility], [address] [place], [place] [address]    Culture (A)  Final    STAPHYLOCOCCUS EPIDERMIDIS SUSCEPTIBILITIES PERFORMED ON PREVIOUS CULTURE WITHIN THE LAST 5 DAYS. Performed at [facility], [address] [place], [place] [address]    Report Status 06/17/2024 FINAL  Final  Culture, blood (Routine X 2) w Reflex to ID Panel     Status: None (Preliminary result)   Collection Time: 06/16/24  2:30 AM   Specimen: BLOOD LEFT ARM  Result  Value Ref Range Status   Specimen Description BLOOD LEFT ARM  Final   Special Requests AEROBIC BOTTLE ONLY Blood Culture adequate volume  Final   Culture   Final    NO GROWTH < 12 HOURS Performed at [facility], [address] [place], [place] [address]    Report Status PENDING  Incomplete  Culture, blood (Routine X 2) w Reflex to ID Panel     Status: None (Preliminary result)   Collection Time: 06/16/24  2:35 AM   Specimen: Right Antecubital; Blood  Result Value Ref Range Status   Specimen Description RIGHT ANTECUBITAL  Final   Special Requests   Final    BOTTLES DRAWN AEROBIC AND ANAEROBIC Blood Culture adequate volume    Culture   Final    NO GROWTH < 12 HOURS Performed at [facility], [address] [place], [place] [address]    Report Status PENDING  Incomplete     Scheduled Meds:  aspirin   81 mg Oral Daily   finasteride   5 mg Oral QPM   pantoprazole  (PROTONIX ) IV  40 mg Intravenous Q24H   rosuvastatin   40 mg Oral Daily   Continuous Infusions:  [START ON 06/18/2024] vancomycin       Procedures/Studies: ECHOCARDIOGRAM COMPLETE Result Date: 06/14/2024    ECHOCARDIOGRAM REPORT   Patient Name:   ISAIH BULGER Date of Exam: 06/13/2024 Medical Rec #:  [id number]  Height:       62.0 in Accession #:    [id number] Weight:       153.2 lb Date of Birth:  Jan 16, 1946   BSA:          1.707 m Patient Age:    77 years   BP:           118/71 mmHg Patient Gender: M          HR:           88 bpm. Exam Location:  [facility] Procedure: 2D Echo, Cardiac Doppler and Color Doppler (Both Spectral and Color            Flow Doppler were utilized during procedure). Indications:    Chest Pain  History:        Patient has prior history of Echocardiogram examinations. Risk                 Factors:Hypertension.  Sonographer:    Vella Key Referring Phys: [id number] EJIROGHENE E EMOKPAE IMPRESSIONS  1. Left ventricular ejection fraction, by estimation, is 55 to 60%. The left ventricle has normal function. There is mild concentric left ventricular hypertrophy. Indeterminate diastolic filling due to E-A fusion.  2. Right ventricular systolic function is normal. The right ventricular size is normal.  3. Right atrial size was moderately dilated.  4. Mild mitral valve regurgitation.  5. Tricuspid valve regurgitation is moderate to severe.  6. S/p AVR (23 mm Inspiris Resilia prosthesis; procedure date 11/02/23). Peak and mean gradients through the valve are 46 and 28 mm Hg respectively Acceleration time is 89 msec Ejection time is 209 msec (AT/ET is 0.39) DVI is 0.39. Compared to echo report from December 2024, mean gradient is increased (12 to 28 mm Hg) and DVI is  decreased (0.55 to 0.41) Cannot exclude obstruction to prosthesis.     Consider TEE to evaluate furtherr.. The aortic valve has been repaired/replaced. Aortic valve regurgitation is not visualized. Aortic valve sclerosis/calcification is present, without any evidence of aortic stenosis. FINDINGS  Left Ventricle: Left ventricular ejection fraction, by  estimation, is 55 to 60%. The left ventricle has normal function. The left ventricular internal cavity size was normal in size. There is mild concentric left ventricular hypertrophy. Indeterminate diastolic filling due to E-A fusion. Right Ventricle: The right ventricular size is normal. Right vetricular wall thickness was not assessed. Right ventricular systolic function is normal. Left Atrium: Left atrial size was normal in size. Right Atrium: Right atrial size was moderately dilated. Pericardium: There is no evidence of pericardial effusion. Mitral Valve: There is mild thickening of the mitral valve leaflet(s). Mild mitral annular calcification. Mild mitral valve regurgitation. Tricuspid Valve: The tricuspid valve is normal in structure. Tricuspid valve regurgitation is moderate to severe. Aortic Valve: S/p AVR (23 mm Inspiris Resilia prosthesis; procedure date 11/02/23). Peak and mean gradients through the valve are 46 and 28 mm Hg respectively Acceleration time is 89 msec Ejection time is 209 msec (AT/ET is 0.39) DVI is 0.39. Compared to  echo report from December 2024, mean gradient is increased (12 to 28 mm Hg) and DVI is decreased (0.55 to 0.41) Cannot exclude obstruction to prosthesis. Consider TEE to evaluate furtherr. The aortic valve has been repaired/replaced. Aortic valve regurgitation is not visualized. Aortic valve sclerosis/calcification is present, without any evidence of aortic stenosis. Aortic valve mean gradient measures 23.5 mmHg. Aortic valve peak gradient measures 37.6 mmHg. Aortic valve area, by VTI measures 0.93 cm. Pulmonic Valve: The  pulmonic valve was not well visualized. Pulmonic valve regurgitation is not visualized. Aorta: The aortic root is normal in size and structure. IAS/Shunts: No atrial level shunt detected by color flow Doppler.  LEFT VENTRICLE PLAX 2D LVIDd:         4.00 cm      Diastology LVIDs:         2.90 cm      LV e' medial:    7.40 cm/s LV PW:         1.20 cm      LV E/e' medial:  12.5 LV IVS:        1.20 cm      LV e' lateral:   9.36 cm/s LVOT diam:     1.70 cm      LV E/e' lateral: 9.9 LV SV:         49 LV SV Index:   29 LVOT Area:     2.27 cm  LV Volumes (MOD) LV vol d, MOD A2C: 104.0 ml LV vol d, MOD A4C: 154.0 ml LV vol s, MOD A2C: 52.1 ml LV vol s, MOD A4C: 69.7 ml LV SV MOD A2C:     51.9 ml LV SV MOD A4C:     154.0 ml LV SV MOD BP:      65.7 ml RIGHT VENTRICLE RV Basal diam:  5.00 cm RV S prime:     10.80 cm/s TAPSE (M-mode): 1.3 cm LEFT ATRIUM           Index        RIGHT ATRIUM           Index LA diam:      3.70 cm 2.17 cm/m   RA Area:     28.10 cm LA Vol (A2C): 94.7 ml 55.48 ml/m  RA Volume:   113.00 ml 66.20 ml/m LA Vol (A4C): 47.4 ml 27.77 ml/m  AORTIC VALVE AV Area (Vmax):    0.84 cm AV Area (Vmean):   0.81 cm AV Area (VTI):     0.93 cm AV Vmax:  306.50 cm/s AV Vmean:          230.500 cm/s AV VTI:            0.528 m AV Peak Grad:      37.6 mmHg AV Mean Grad:      23.5 mmHg LVOT Vmax:         114.00 cm/s LVOT Vmean:        82.200 cm/s LVOT VTI:          0.217 m LVOT/AV VTI ratio: 0.41  AORTA Ao Root diam: 2.90 cm MITRAL VALVE                TRICUSPID VALVE MV Area (PHT): 8.25 cm     TV Peak grad:   24.0 mmHg MV Decel Time: 92 msec      TV Vmax:        2.45 m/s MV E velocity: 92.20 cm/s   TR Peak grad:   24.0 mmHg MV A velocity: 132.00 cm/s  TR Vmax:        245.00 cm/s MV E/A ratio:  0.70                             SHUNTS                             Systemic VTI:  0.22 m                             Systemic Diam: 1.70 cm Vina Gull MD Electronically signed by Vina Gull MD Signature Date/Time:  06/14/2024/11:51:30 AM    Final    EEG adult Result Date: 06/14/2024 Shelton Arlin KIDD, MD     06/14/2024  7:41 AM Patient Name: Mavis Fichera MRN: [id number] Epilepsy Attending: Arlin KIDD Shelton Referring Physician/Provider: Evonnie Lenis, MD Date: 06/13/2024 Duration: 23.04 mins Patient history: 78yo M with episodes of shaking. EEG to evaluate for seizure. Level of alertness: Awake AEDs during EEG study: None Technical aspects: This EEG study was done with scalp electrodes positioned according to the 10-20 International system of electrode placement. Electrical activity was reviewed with band pass filter of 1-70Hz , sensitivity of 7 uV/mm, display speed of 65mm/sec with a 60Hz  notched filter applied as appropriate. EEG data were recorded continuously and digitally stored.  Video monitoring was available and reviewed as appropriate. Description: The posterior dominant rhythm consists of 8-9 Hz activity of moderate voltage (25-35 uV) seen predominantly in posterior head regions, symmetric and reactive to eye opening and eye closing. Physiologic photic driving was not seen during photic stimulation.  Hyperventilation was not performed.   IMPRESSION: This study is within normal limits. No seizures or epileptiform discharges were seen throughout the recording. A normal interictal EEG does not exclude the diagnosis of epilepsy. Priyanka KIDD Shelton   CT ANGIO NECK W OR WO CONTRAST Result Date: 06/13/2024 CLINICAL DATA:  Follow-up examination for stroke. EXAM: CT ANGIOGRAPHY NECK TECHNIQUE: Multidetector CT imaging of the neck was performed using the standard protocol during bolus administration of intravenous contrast. Multiplanar CT image reconstructions and MIPs were obtained to evaluate the vascular anatomy. Carotid stenosis measurements (when applicable) are obtained utilizing NASCET criteria, using the distal internal carotid diameter as the denominator. RADIATION DOSE REDUCTION: This exam was performed according to the  departmental dose-optimization program which includes automated exposure control, adjustment of the  mA and/or kV according to patient size and/or use of iterative reconstruction technique. CONTRAST:  75mL OMNIPAQUE  IOHEXOL  350 MG/ML SOLN COMPARISON:  Prior brain MRI from earlier the same day. FINDINGS: Aortic arch: Visualized aortic arch within normal limits for caliber with standard 3 vessel morphology. Moderate aortic atherosclerosis. No high-grade stenosis about the origin the great vessels. Right carotid system: Right common and internal carotid arteries are patent without dissection. Moderate calcified plaque about the right carotid bulb/proximal right ICA without hemodynamically significant greater than 50% stenosis. Left carotid system: Left common and internal carotid arteries are patent without dissection. Moderate calcified plaque about the left carotid bulb/proximal cervical left ICA without hemodynamically significant greater than 50% stenosis. Vertebral arteries: Both vertebral arteries arise from subclavian arteries. No significant proximal subclavian artery stenosis. Vertebral arteries M cells are patent without significant stenosis or dissection. Skeleton: No worrisome osseous lesions.  Prior sternotomy noted. Other neck: No other acute finding. Few small thyroid  nodules noted, largest of which measures 8 mm on the right. These are of doubtful significance given size and patient age, no follow-up imaging recommended (ref: J Am Coll Radiol. 2015 Feb;12(2): 143-50). Upper chest: Upper lobe predominant emphysema.  No acute finding. IMPRESSION: 1. Negative CTA for large vessel occlusion. 2. Moderate atherosclerotic change about the carotid bifurcations/proximal ICAs without hemodynamically significant greater than 50% stenosis. 3. Wide patency of both vertebral arteries within the neck. 4. Aortic Atherosclerosis (ICD10-I70.0) and Emphysema (ICD10-J43.9). Electronically Signed   By: Morene Hoard  M.D.   On: 06/13/2024 22:56   MR BRAIN WO CONTRAST Result Date: 06/13/2024 CLINICAL DATA:  seizure like activity EXAM: MRI HEAD WITHOUT CONTRAST MRA HEAD WITHOUT CONTRAST TECHNIQUE: Multiplanar, multi-echo pulse sequences of the brain and surrounding structures were acquired without intravenous contrast. Angiographic images of the Circle of Willis were acquired using MRA technique without intravenous contrast. COMPARISON:  CT head 15 2007. 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 No significant mass effect or midline shift. Additional patchy T2/FLAIR hyperintensity in the white matter compatible with microvascular ischemic disease. No evidence of acute hemorrhage, mass lesion, or hydrocephalus. Vascular: See below. Skull and upper cervical spine: Normal marrow signal. Sinuses/Orbits: Mostly clear sinuses.  No acute orbital findings. Other: No mastoid effusions. MRA HEAD FINDINGS Anterior circulation: Bilateral intracranial ICAs, MCAs, and ACAs are patent without proximal hemodynamically significant stenosis. Posterior circulation:Bilateral intradural vertebral arteries, basilar artery and bilateral posterior cerebral arteries are patent without proximally in amic least significant stenosis. IMPRESSION: 1. 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 2. No emergent large vessel occlusion or proximal hemodynamically significant stenosis. Electronically Signed   By: Gilmore GORMAN Molt M.D.   On: 06/13/2024 16:11   MR ANGIO HEAD WO CONTRAST Result Date: 06/13/2024 CLINICAL DATA:  seizure like activity EXAM: MRI HEAD WITHOUT CONTRAST MRA HEAD WITHOUT CONTRAST TECHNIQUE: Multiplanar, multi-echo pulse sequences of the brain and surrounding structures were acquired without intravenous contrast. Angiographic images of the Circle of Willis were acquired using MRA technique without intravenous contrast. COMPARISON:  CT head 15 2007. FINDINGS: MRI HEAD FINDINGS Brain: Small  acute infarct in the left occipital lobe. No significant mass effect or midline shift. Additional patchy T2/FLAIR hyperintensity in the white matter compatible with microvascular ischemic disease. No evidence of acute hemorrhage, mass lesion, or hydrocephalus. Vascular: See below. Skull and upper cervical spine: Normal marrow signal. Sinuses/Orbits: Mostly clear sinuses.  No acute orbital findings. Other: No mastoid effusions. MRA HEAD FINDINGS Anterior circulation: Bilateral intracranial  ICAs, MCAs, and ACAs are patent without proximal hemodynamically significant stenosis. Posterior circulation:Bilateral intradural vertebral arteries, basilar artery and bilateral posterior cerebral arteries are patent without proximally in amic least significant stenosis. IMPRESSION: 1. 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 2. No emergent large vessel occlusion or proximal hemodynamically significant stenosis. Electronically Signed   By: Gilmore GORMAN Molt M.D.   On: 06/13/2024 16:11   US  Venous Img Lower Bilateral Result Date: 06/13/2024 CLINICAL DATA:  Bilateral leg swelling. EXAM: BILATERAL LOWER EXTREMITY VENOUS DOPPLER ULTRASOUND TECHNIQUE: Gray-scale sonography with graded compression, as well as color Doppler and duplex ultrasound were performed to evaluate the lower extremity deep venous systems from the level of the common femoral vein and including the common femoral, femoral, profunda femoral, popliteal and calf veins including the posterior tibial, peroneal and gastrocnemius veins when visible. The superficial great saphenous vein was also interrogated. Spectral Doppler was utilized to evaluate flow at rest and with distal augmentation maneuvers in the common femoral, femoral and popliteal veins. COMPARISON:  None Available. FINDINGS: RIGHT LOWER EXTREMITY Common Femoral Vein: No evidence of thrombus. Normal compressibility, respiratory phasicity and response to augmentation. Saphenofemoral Junction: No  evidence of thrombus. Normal compressibility and flow on color Doppler imaging. Profunda Femoral Vein: No evidence of thrombus. Normal compressibility and flow on color Doppler imaging. Femoral Vein: No evidence of thrombus. Normal compressibility, respiratory phasicity and response to augmentation. Popliteal Vein: No evidence of thrombus. Normal compressibility, respiratory phasicity and response to augmentation. Calf Veins: No evidence of thrombus. Normal compressibility and flow on color Doppler imaging. Superficial Great Saphenous Vein: No evidence of thrombus. Normal compressibility. Other Findings:  None. LEFT LOWER EXTREMITY Common Femoral Vein: No evidence of thrombus. Normal compressibility, respiratory phasicity and response to augmentation. Saphenofemoral Junction: No evidence of thrombus. Normal compressibility and flow on color Doppler imaging. Profunda Femoral Vein: No evidence of thrombus. Normal compressibility and flow on color Doppler imaging. Femoral Vein: No evidence of thrombus. Normal compressibility, respiratory phasicity and response to augmentation. Popliteal Vein: No evidence of thrombus. Normal compressibility, respiratory phasicity and response to augmentation. Calf Veins: No evidence of thrombus. Normal compressibility and flow on color Doppler imaging. Superficial Great Saphenous Vein: No evidence of thrombus. Normal compressibility. Other Findings:  None. IMPRESSION: No evidence of deep venous thrombosis in either lower extremity. Electronically Signed   By: Juliene Balder M.D.   On: 06/13/2024 10:52   CT Angio Chest PE W and/or Wo Contrast Result Date: 06/12/2024 CLINICAL DATA:  Positive D-dimer and chest pain, initial encounter EXAM: CT ANGIOGRAPHY CHEST WITH CONTRAST TECHNIQUE: Multidetector CT imaging of the chest was performed using the standard protocol during bolus administration of intravenous contrast. Multiplanar CT image reconstructions and MIPs were obtained to evaluate the  vascular anatomy. RADIATION DOSE REDUCTION: This exam was performed according to the departmental dose-optimization program which includes automated exposure control, adjustment of the mA and/or kV according to patient size and/or use of iterative reconstruction technique. CONTRAST:  75mL OMNIPAQUE  IOHEXOL  350 MG/ML SOLN COMPARISON:  Chest x-ray from earlier in the same day. CT from 10/10/2023 FINDINGS: Cardiovascular: Atherosclerotic calcifications of the aorta are noted. No aneurysmal dilatation is seen. 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 Coronary calcifications are noted. The pulmonary artery shows a normal branching pattern bilaterally. No filling defect to suggest pulmonary embolism is noted. Mediastinum/Nodes: Thoracic inlet is within normal limits. No hilar or mediastinal adenopathy is noted. The esophagus as visualized is within normal limits. Lungs/Pleura: Lungs are well aerated bilaterally.  Mild emphysematous changes are seen. Parenchymal nodule is seen. No infiltrate or effusion is noted. Upper Abdomen: Visualized upper abdomen demonstrates stable simple cyst within the right lobe of the liver. Musculoskeletal: Degenerative changes of the thoracic spine are seen. No acute bony abnormality is noted. Review of the MIP images confirms the above findings. IMPRESSION: No evidence of pulmonary embolism. No acute abnormality seen. Aortic Atherosclerosis (ICD10-I70.0) and Emphysema (ICD10-J43.9). Electronically Signed   By: Oneil Devonshire M.D.   On: 06/12/2024 19:24   DG Chest Port 1 View Result Date: 06/12/2024 CLINICAL DATA:  Chest pain EXAM: PORTABLE CHEST 1 VIEW COMPARISON:  05/07/2024 FINDINGS: Prior CABG and valve replacement. Heart and mediastinal contours are within normal limits. No focal opacities or effusions. No acute bony abnormality. Aortic atherosclerosis. IMPRESSION: No active cardiopulmonary disease. Electronically Signed   By: Franky Crease M.D.   On: 06/12/2024 18:05     Alm Schneider, DO  [facility]  If 7PM-7AM, please contact night-coverage [email or web address] Password [email or web address] 06/17/2024, 11:27 AM   LOS: 2 days

## 2024-06-17 NOTE — Progress Notes (Signed)
   [facility] has been requested to perform a transesophageal echocardiogram on [name] for bacteremia.    The patient does NOT have any absolute or relative contraindications to a Transesophageal Echocardiogram (TEE).  The patient has: History of Severe Valve Disease (stenosis or regurgitation)    After careful review of history and examination, the risks and benefits of transesophageal echocardiogram have been explained including risks of esophageal damage, perforation (1:10,000 risk), bleeding, pharyngeal hematoma as well as other potential complications associated with conscious sedation including aspiration, arrhythmia, respiratory failure and death. Alternatives to treatment were discussed, questions were answered. Patient is willing to proceed.   Signed, Lorette CINDERELLA Kapur, PA-C  06/17/2024 10:25 AM

## 2024-06-17 NOTE — Progress Notes (Signed)
 Pharmacy Antibiotic Note  Kyle Matthews is a 78 y.o. male admitted on 06/12/2024 with bacteremia.  Pharmacy has been consulted for vancomycin  dosing.  Plan: Vanco peak 29  trough 4 Vanco AUC: 328 Change to vancomycin  1000 mg IV every 12 hours Monitor labs, c/s, and vanco levels as indicated.  Height: 5' 2 (157.5 cm) Weight: 69.5 kg (153 lb 3.5 oz) IBW/kg (Calculated) : 54.6  Temp (24hrs), Avg:99.2 F (37.3 C), Min:99 F (37.2 C), Max:99.6 F (37.6 C)  Recent Labs  Lab 06/12/24 1819 06/13/24 0454 06/13/24 0937 06/14/24 0426 06/15/24 0306 06/16/24 0230 06/16/24 1201 06/17/24 0312 06/17/24 0916  WBC 14.0* 14.0*  --  14.2* 11.3* 14.0*  --  11.5*  --   CREATININE 0.70 0.64  --  0.75 0.76  --   --  0.59*  --   LATICACIDVEN  --   --  1.1  --   --   --   --   --   --   VANCOTROUGH  --   --   --   --   --   --   --   --  4*  VANCOPEAK  --   --   --   --   --   --  29*  --   --     Estimated Creatinine Clearance: 68.4 mL/min (A) (by C-G formula based on SCr of 0.59 mg/dL (L)).    Allergies  Allergen Reactions   Tetanus Toxoids Other (See Comments)    Childhood Allergy    Zetia  [Ezetimibe ] Diarrhea    Antimicrobials this admission: Vanco 6/27 >>  Microbiology results: 6/29 Bcx: ngtd 6/27 BCx: staph epi 6/25 Bcx: gram + cocci 2/2 sets 6/25 BCID: staph species/staph epi 6/26 Ucx:  insignificant growth  Thank you for allowing pharmacy to be a part of this patient's care.  Elspeth Sour, PharmD Clinical Pharmacist 06/17/2024 11:15 AM

## 2024-06-18 ENCOUNTER — Inpatient Hospital Stay (HOSPITAL_COMMUNITY): Admitting: Anesthesiology

## 2024-06-18 ENCOUNTER — Encounter (HOSPITAL_COMMUNITY)
Admission: EM | Disposition: A | Discharge status: Home Health | Payer: Self-pay | Source: Other Acute Inpatient Hospital | Attending: Surgery

## 2024-06-18 ENCOUNTER — Encounter (HOSPITAL_COMMUNITY): Payer: Self-pay | Admitting: Cardiology

## 2024-06-18 ENCOUNTER — Inpatient Hospital Stay (HOSPITAL_COMMUNITY)

## 2024-06-18 DIAGNOSIS — I330 Acute and subacute infective endocarditis: Secondary | ICD-10-CM

## 2024-06-18 DIAGNOSIS — I361 Nonrheumatic tricuspid (valve) insufficiency: Secondary | ICD-10-CM | POA: Diagnosis not present

## 2024-06-18 DIAGNOSIS — F32A Depression, unspecified: Secondary | ICD-10-CM

## 2024-06-18 DIAGNOSIS — I639 Cerebral infarction, unspecified: Principal | ICD-10-CM

## 2024-06-18 DIAGNOSIS — I25110 Atherosclerotic heart disease of native coronary artery with unstable angina pectoris: Secondary | ICD-10-CM

## 2024-06-18 DIAGNOSIS — I5033 Acute on chronic diastolic (congestive) heart failure: Secondary | ICD-10-CM | POA: Diagnosis not present

## 2024-06-18 DIAGNOSIS — R079 Chest pain, unspecified: Secondary | ICD-10-CM | POA: Diagnosis not present

## 2024-06-18 DIAGNOSIS — I2510 Atherosclerotic heart disease of native coronary artery without angina pectoris: Secondary | ICD-10-CM | POA: Diagnosis not present

## 2024-06-18 DIAGNOSIS — T826XXA Infection and inflammatory reaction due to cardiac valve prosthesis, initial encounter: Secondary | ICD-10-CM

## 2024-06-18 DIAGNOSIS — R7881 Bacteremia: Secondary | ICD-10-CM | POA: Diagnosis not present

## 2024-06-18 DIAGNOSIS — I110 Hypertensive heart disease with heart failure: Secondary | ICD-10-CM

## 2024-06-18 DIAGNOSIS — A4902 Methicillin resistant Staphylococcus aureus infection, unspecified site: Secondary | ICD-10-CM | POA: Diagnosis not present

## 2024-06-18 DIAGNOSIS — I360 Nonrheumatic tricuspid (valve) stenosis: Secondary | ICD-10-CM

## 2024-06-18 DIAGNOSIS — Z8673 Personal history of transient ischemic attack (TIA), and cerebral infarction without residual deficits: Secondary | ICD-10-CM

## 2024-06-18 DIAGNOSIS — Z951 Presence of aortocoronary bypass graft: Secondary | ICD-10-CM

## 2024-06-18 DIAGNOSIS — E785 Hyperlipidemia, unspecified: Secondary | ICD-10-CM

## 2024-06-18 DIAGNOSIS — I079 Rheumatic tricuspid valve disease, unspecified: Secondary | ICD-10-CM

## 2024-06-18 DIAGNOSIS — I509 Heart failure, unspecified: Secondary | ICD-10-CM

## 2024-06-18 DIAGNOSIS — Z952 Presence of prosthetic heart valve: Secondary | ICD-10-CM

## 2024-06-18 DIAGNOSIS — I082 Rheumatic disorders of both aortic and tricuspid valves: Secondary | ICD-10-CM | POA: Diagnosis not present

## 2024-06-18 DIAGNOSIS — N400 Enlarged prostate without lower urinary tract symptoms: Secondary | ICD-10-CM

## 2024-06-18 DIAGNOSIS — I350 Nonrheumatic aortic (valve) stenosis: Secondary | ICD-10-CM | POA: Diagnosis not present

## 2024-06-18 DIAGNOSIS — I5030 Unspecified diastolic (congestive) heart failure: Secondary | ICD-10-CM

## 2024-06-18 HISTORY — DX: Infection and inflammatory reaction due to cardiac valve prosthesis, initial encounter: T82.6XXA

## 2024-06-18 HISTORY — PX: TRANSESOPHAGEAL ECHOCARDIOGRAM (CATH LAB): EP1270

## 2024-06-18 HISTORY — DX: Acute and subacute infective endocarditis: I33.0

## 2024-06-18 HISTORY — DX: Rheumatic tricuspid valve disease, unspecified: I07.9

## 2024-06-18 LAB — ECHO TEE
AV Mean grad: 20 mmHg
AV Peak grad: 33.6 mmHg
Ao pk vel: 2.9 m/s

## 2024-06-18 LAB — BASIC METABOLIC PANEL WITH GFR
Anion gap: 8 (ref 5–15)
BUN: 9 mg/dL (ref 8–23)
CO2: 24 mmol/L (ref 22–32)
Calcium: 8.3 mg/dL — ABNORMAL LOW (ref 8.9–10.3)
Chloride: 102 mmol/L (ref 98–111)
Creatinine, Ser: 0.62 mg/dL (ref 0.61–1.24)
GFR, Estimated: >60 mL/min (ref >60)
Glucose, Bld: 91 mg/dL (ref 70–99)
Potassium: 3.3 mmol/L — ABNORMAL LOW (ref 3.5–5.1)
Sodium: 134 mmol/L — ABNORMAL LOW (ref 135–145)

## 2024-06-18 LAB — GLUCOSE, CAPILLARY: Glucose-Capillary: 122 mg/dL — ABNORMAL HIGH (ref 70–99)

## 2024-06-18 SURGERY — TRANSESOPHAGEAL ECHOCARDIOGRAM (TEE) (CATHLAB)
Anesthesia: Monitor Anesthesia Care

## 2024-06-18 MED ORDER — SODIUM CHLORIDE 0.9 % IV SOLN
INTRAVENOUS | Status: DC | PRN
Start: 2024-06-18 — End: 2024-06-18

## 2024-06-18 MED ORDER — PROPOFOL 10 MG/ML IV BOLUS
INTRAVENOUS | Status: DC | PRN
Start: 2024-06-18 — End: 2024-06-18
  Administered 2024-06-18 (×2): 20 mg via INTRAVENOUS
  Administered 2024-06-18: 30 mg via INTRAVENOUS

## 2024-06-18 MED ORDER — PROPOFOL 500 MG/50ML IV EMUL
INTRAVENOUS | Status: DC | PRN
  Administered 2024-06-18: 100 ug/kg/min via INTRAVENOUS

## 2024-06-18 MED ORDER — PHENYLEPHRINE HCL-NACL 20-0.9 MG/250ML-% IV SOLN
INTRAVENOUS | Status: DC | PRN
  Administered 2024-06-18: 50 ug/min via INTRAVENOUS

## 2024-06-18 MED ORDER — PHENYLEPHRINE HCL (PRESSORS) 10 MG/ML IV SOLN
INTRAVENOUS | Status: DC | PRN
Start: 2024-06-18 — End: 2024-06-18
  Administered 2024-06-18 (×2): 160 ug via INTRAVENOUS
  Administered 2024-06-18 (×2): 80 ug via INTRAVENOUS

## 2024-06-18 MED ORDER — LIDOCAINE HCL (PF) 2 % IJ SOLN
INTRAMUSCULAR | Status: DC | PRN
  Administered 2024-06-18: 60 mg via INTRADERMAL

## 2024-06-18 NOTE — Transfer of Care (Signed)
 Immediate Anesthesia Transfer of Care Note  Patient: Kyle Matthews  Procedure(s) Performed: TRANSESOPHAGEAL ECHOCARDIOGRAM  Patient Location: Cath Lab  Anesthesia Type:MAC  Level of Consciousness: awake, alert , and oriented  Airway & Oxygen Therapy: Patient Spontanous Breathing and Patient connected to face mask oxygen  Post-op Assessment: Report given to RN, Post -op Vital signs reviewed and stable, Patient moving all extremities, and Patient moving all extremities X 4  Post vital signs: Reviewed and stable  Last Vitals:  Vitals Value Taken Time  BP 95/58 06/18/24 08:25  Temp    Pulse 85 06/18/24 08:27  Resp 22 06/18/24 08:27  SpO2 91 % 06/18/24 08:27  Vitals shown include unfiled device data.  Last Pain:  Vitals:   06/18/24 0700  TempSrc: Temporal  PainSc:          Complications: No notable events documented.

## 2024-06-18 NOTE — Interval H&P Note (Signed)
 History and Physical Interval Note:  06/18/2024 7:34 AM  Kyle Matthews  has presented today for surgery, with the diagnosis of Bacteremia, AS.  The various methods of treatment have been discussed with the patient and family. After consideration of risks, benefits and other options for treatment, the patient has consented to  Procedure(s): TRANSESOPHAGEAL ECHOCARDIOGRAM (N/A) as a surgical intervention.  The patient's history has been reviewed, patient examined, no change in status, stable for surgery.  I have reviewed the patient's chart and labs.  Questions were answered to the patient's satisfaction.     [employer]

## 2024-06-18 NOTE — Consult Note (Addendum)
 [facility]    Date of Admission:  06/12/2024     Total days of antibiotics 7               Reason for Consult: MRSE bacteremia and endocarditis  Referring Provider: Dr. Alvia Primary Care Provider: Verena Mems, MD   ASSESSMENT:  [name] is a 78 y/o Caucasian male s/p aortic valve replacement in November 2024 and recent simple robotic prostatectomy in June 2025 presenting with chest pain and found to have methicillin-resistant Staphylococcus epidermidis bacteremia, tricuspid valve endocarditis, prosthetic aortic valve endocarditis, and acute left occipital lobe infarct.  Blood cultures from 06/16/24 are without growth in 2 days. Source of the infection is unclear. Discussed the recommended plan to continue current dose of vancomycin  and likely need for prolonged course of antibiotics and Cardiothoracic Surgery evaluation for any surgical interventions in the setting of prosthetic aortic valve replacement. Monitor blood cultures for continued clearance of bacteremia. Standard/universal precautions. Remaining medical and supportive care per Internal Medicine.   PLAN:  Continue current dose of vancomycin . Therapeutic drug monitoring of renal function and vancomycin  levels.  Cardiothoracic Surgery evaluation for any surgical interventions.  Monitor cultures for clearance of bacteremia. Standard/universal precautions. Remaining medical and supportive care per Internal Medicine.    Principal Problem:   Endocarditis of prosthetic aortic valve Active Problems:   Bacteremia   Acute embolic stroke (HCC)   Endocarditis of tricuspid valve   Essential hypertension   Chest pain   Chronic heart failure with preserved ejection fraction (HFpEF) (HCC)   S/P CABG x 2   S/P AVR (aortic valve replacement)   BPH with obstruction/lower urinary tract symptoms   SIRS (systemic inflammatory response syndrome) (HCC)    aspirin   81 mg Oral Daily   finasteride    5 mg Oral QPM   pantoprazole  (PROTONIX ) IV  40 mg Intravenous Q24H   rosuvastatin   40 mg Oral Daily     HPI: Kyle Matthews is a 78 y.o. male with previous medical history of hypertension, moderate calcific aortic stenosis status post aortic valve replacement in November 2025, and BPH presenting from home with chest pain and bilateral feet swelling.  [name] presented with acute onset substernal chest pain.  Waxing and waning night sweats and chills.  Afebrile on arrival with leukocytosis of 14,000.  Recently underwent robotic simple prostatectomy for refractory BPH on 05/22/2024.  Chest x-ray with no cardiopulmonary disease.  CT angio chest with no evidence of pulmonary embolism and no acute abnormality seen.  MRI brain with small acute infarct in the left occipital lobe and no emergent large vessel occlusions.  Initially started on ciprofloxacin .  Blood cultures drawn 06/12/2024 growing methicillin-resistant Staphylococcus epidermidis.  Antibiotics changed on 06/14/2024 to vancomycin .  Noted to have recurrent bacteremia on 06/14/2024 with cultures once again positive for methicillin-resistant Staphylococcus epidermidis.  TTE without evidence of vegetation with noted status post aortic valve replacement.  Blood cultures from 06/16/2024 are without growth in 2 days.  TEE performed on 7/1 with mobile density on tricuspid valve and mobile mass on the bioprosthetic aortic valve.  Mr. [name] has been doing okay since being admitted to the hospital and tolerating antibiotics with no adverse side effects.  Outside of the urology procedure no other procedures. No recent weight loss.   Review of Systems: Review of Systems  Constitutional:  Negative for chills, fever and weight loss.  Respiratory:  Negative for cough, shortness of breath and wheezing.   Cardiovascular:  Negative for chest pain and leg swelling.  Gastrointestinal:  Negative for abdominal pain, constipation, diarrhea, nausea and vomiting.  Skin:   Negative for rash.     Past Medical History:  Diagnosis Date   Anemia    Anxiety    Arthritis    BPH (benign prostatic hyperplasia)    Status post biopsy in 2009. - w/ LUTS   Coronary artery disease    a. s/p CABG in 10/2023 with LIMA-LAD and SVG-Ramus at the time of AVR   Depression    Essential hypertension    Hyperlipidemia    On Crestor  - followed by PCP   Moderate calcific aortic stenosis 04/2017   a. s/p AVR with 23 mm Edwards Inspiris Resilia pericardial valve in 10/2023   Stroke (HCC) 2007   No true etiology noted   UTI (urinary tract infection)     Social History   Tobacco Use   Smoking status: Every Day    Current packs/day: 0.00    Average packs/day: 1.5 packs/day for 24.0 years (36.0 ttl pk-yrs)    Types: Cigarettes    Last attempt to quit: 08/21/2023    Years since quitting: 0.8   Smokeless tobacco: Never  Vaping Use   Vaping status: Never Used  Substance Use Topics   Alcohol use: Not Currently    Alcohol/week: 6.0 standard drinks of alcohol    Types: 6 Cans of beer per week   Drug use: No    Family History  Problem Relation Age of Onset   Coronary artery disease Mother 74       62. Was diagnosed with a blood clot - suspect that this was a STEMI   Lung cancer Father    Thyroid  disease Sister    Healthy Brother 51   Pancreatic cancer Sister 38   Cancer Maternal Grandmother    Heart disease Maternal Grandfather 2   Cancer Paternal Grandmother    Stroke Paternal Grandfather     Allergies  Allergen Reactions   Tetanus Toxoids Other (See Comments)    Childhood Allergy    Zetia  [Ezetimibe ] Diarrhea    OBJECTIVE: Blood pressure 124/69, pulse 79, temperature 98.7 F (37.1 C), temperature source Oral, resp. rate 18, height 5' 2 (1.575 m), weight 69.5 kg, SpO2 97%.  Physical Exam Constitutional:      General: He is not in acute distress.    Appearance: He is well-developed.   Cardiovascular:     Rate and Rhythm: Normal rate and regular  rhythm.     Heart sounds: Murmur heard.  Pulmonary:     Effort: Pulmonary effort is normal.     Breath sounds: Normal breath sounds.   Skin:    General: Skin is warm and dry.   Neurological:     Mental Status: He is alert.     Lab Results Lab Results  Component Value Date   WBC 11.5 (H) 06/17/2024   HGB 10.1 (L) 06/17/2024   HCT 29.8 (L) 06/17/2024   MCV 90.0 06/17/2024   PLT 204 06/17/2024    Lab Results  Component Value Date   CREATININE 0.62 06/18/2024   BUN 9 06/18/2024   NA 134 (L) 06/18/2024   K 3.3 (L) 06/18/2024   CL 102 06/18/2024   CO2 24 06/18/2024    Lab Results  Component Value Date   ALT 12 06/12/2024   AST 19 06/12/2024   ALKPHOS 64 06/12/2024   BILITOT 0.8 06/12/2024     Microbiology: Recent Results (from  the past 240 hours)  Culture, blood (Routine X 2) w Reflex to ID Panel     Status: Abnormal   Collection Time: 06/12/24 10:55 PM   Specimen: BLOOD  Result Value Ref Range Status   Specimen Description   Final    BLOOD BLOOD LEFT ARM Performed at [facility], [address] [place], [place] [address]    Special Requests   Final    BOTTLES DRAWN AEROBIC AND ANAEROBIC Blood Culture adequate volume Performed at [facility], [address], [place], [place] [address]    Culture  Setup Time   Final    GRAM POSITIVE COCCI IN BOTH AEROBIC AND ANAEROBIC BOTTLES CRITICAL VALUE NOTED.  VALUE IS CONSISTENT WITH PREVIOUSLY REPORTED AND CALLED VALUE. JESSICA ELLER AT 2315 06/13/24 BY A. SNYDER GRAM STAIN REVIEWED-AGREE WITH RESULT    Culture (A)  Final    STAPHYLOCOCCUS EPIDERMIDIS SUSCEPTIBILITIES PERFORMED ON PREVIOUS CULTURE WITHIN THE LAST 5 DAYS. Performed at [facility], [address], [place], [place] [address]    Report Status 06/17/2024 FINAL  Final  Culture, blood (Routine X 2) w Reflex to ID Panel     Status: Abnormal   Collection Time: 06/12/24 10:57 PM   Specimen: BLOOD LEFT HAND  Result Value Ref Range Status   Specimen  Description   Final    BLOOD LEFT HAND Performed at [facility], [address] [place], [place] [address]    Special Requests   Final    BOTTLES DRAWN AEROBIC AND ANAEROBIC Blood Culture adequate volume Performed at [facility], [address], [place], [place] [address]    Culture  Setup Time   Final    GRAM POSITIVE COCCI IN BOTH AEROBIC AND ANAEROBIC BOTTLES Gram Stain Report Called to,Read Back By and Verified With: JESSICA ELLER,RN AT 2315 06/13/24 BY A. SNYDER CRITICAL RESULT CALLED TO, READ BACK BY AND VERIFIED WITH: LOISE MORONES RN 06/14/2024 @ 0414 BY AB GRAM STAIN REVIEWED-AGREE WITH RESULT Performed at [facility], [address] [place], [place] [address]    Culture STAPHYLOCOCCUS EPIDERMIDIS (A)  Final   Report Status 06/17/2024 FINAL  Final   Organism ID, Bacteria STAPHYLOCOCCUS EPIDERMIDIS  Final      Susceptibility   Staphylococcus epidermidis - MIC*    CIPROFLOXACIN  >=8 RESISTANT Resistant     ERYTHROMYCIN >=8 RESISTANT Resistant     GENTAMICIN <=0.5 SENSITIVE Sensitive     OXACILLIN >=4 RESISTANT Resistant     TETRACYCLINE >=16 RESISTANT Resistant     VANCOMYCIN  <=0.5 SENSITIVE Sensitive     TRIMETH/SULFA 160 RESISTANT Resistant     CLINDAMYCIN >=8 RESISTANT Resistant     RIFAMPIN <=0.5 SENSITIVE Sensitive     Inducible Clindamycin NEGATIVE Sensitive     * STAPHYLOCOCCUS EPIDERMIDIS  Blood Culture ID Panel (Reflexed)     Status: Abnormal   Collection Time: 06/12/24 10:57 PM  Result Value Ref Range Status   Enterococcus faecalis NOT DETECTED NOT DETECTED Final   Enterococcus Faecium NOT DETECTED NOT DETECTED Final   Listeria monocytogenes NOT DETECTED NOT DETECTED Final   Staphylococcus species DETECTED (A) NOT DETECTED Final    Comment: CRITICAL RESULT CALLED TO, READ BACK BY AND VERIFIED WITH: N BAUGH RN 06/14/2024 @ 0414 BY AB    Staphylococcus aureus (BCID) NOT DETECTED NOT DETECTED Final   Staphylococcus epidermidis DETECTED (A) NOT DETECTED  Final    Comment: Methicillin (oxacillin) resistant coagulase negative staphylococcus. Possible blood culture contaminant (unless isolated  from more than one blood culture draw or clinical case suggests pathogenicity). No antibiotic treatment is indicated for blood  culture contaminants. CRITICAL RESULT CALLED TO, READ BACK BY AND VERIFIED WITH: N BAUGH RN 06/14/2024 @ 0414 BY AB    Staphylococcus lugdunensis NOT DETECTED NOT DETECTED Final   Streptococcus species NOT DETECTED NOT DETECTED Final   Streptococcus agalactiae NOT DETECTED NOT DETECTED Final   Streptococcus pneumoniae NOT DETECTED NOT DETECTED Final   Streptococcus pyogenes NOT DETECTED NOT DETECTED Final   A.calcoaceticus-baumannii NOT DETECTED NOT DETECTED Final   Bacteroides fragilis NOT DETECTED NOT DETECTED Final   Enterobacterales NOT DETECTED NOT DETECTED Final   Enterobacter cloacae complex NOT DETECTED NOT DETECTED Final   Escherichia coli NOT DETECTED NOT DETECTED Final   Klebsiella aerogenes NOT DETECTED NOT DETECTED Final   Klebsiella oxytoca NOT DETECTED NOT DETECTED Final   Klebsiella pneumoniae NOT DETECTED NOT DETECTED Final   Proteus species NOT DETECTED NOT DETECTED Final   Salmonella species NOT DETECTED NOT DETECTED Final   Serratia marcescens NOT DETECTED NOT DETECTED Final   Haemophilus influenzae NOT DETECTED NOT DETECTED Final   Neisseria meningitidis NOT DETECTED NOT DETECTED Final   Pseudomonas aeruginosa NOT DETECTED NOT DETECTED Final   Stenotrophomonas maltophilia NOT DETECTED NOT DETECTED Final   Candida albicans NOT DETECTED NOT DETECTED Final   Candida auris NOT DETECTED NOT DETECTED Final   Candida glabrata NOT DETECTED NOT DETECTED Final   Candida krusei NOT DETECTED NOT DETECTED Final   Candida parapsilosis NOT DETECTED NOT DETECTED Final   Candida tropicalis NOT DETECTED NOT DETECTED Final   Cryptococcus neoformans/gattii NOT DETECTED NOT DETECTED Final   Methicillin resistance mecA/C  DETECTED (A) NOT DETECTED Final    Comment: CRITICAL RESULT CALLED TO, READ BACK BY AND VERIFIED WITHBETHA LOISE MORONES RN 06/14/2024 @ 0414 BY AB Performed at [facility], [address] [place], [place] [address]   Urine Culture     Status: Abnormal   Collection Time: 06/13/24  1:20 PM   Specimen: Urine, Clean Catch  Result Value Ref Range Status   Specimen Description   Final    URINE, CLEAN CATCH Performed at [facility], [address], [place], [place] [address]    Special Requests   Final    NONE Performed at [facility], [address], [place], [place] [address]    Culture (A)  Final    <10,000 COLONIES/mL INSIGNIFICANT GROWTH Performed at [facility], [address], [place], [place] [address]    Report Status 06/14/2024 FINAL  Final  Culture, blood (Routine X 2) w Reflex to ID Panel     Status: Abnormal   Collection Time: 06/14/24  7:44 AM   Specimen: BLOOD  Result Value Ref Range Status   Specimen Description   Final    BLOOD RIGHT ANTECUBITAL Performed at [facility], [address] [place], [place] [address]    Special Requests   Final    BOTTLES DRAWN AEROBIC AND ANAEROBIC Blood Culture adequate volume Performed at [facility], [address], [place], [place] [address]    Culture  Setup Time   Final    GRAM POSITIVE COCCI IN BOTH AEROBIC AND ANAEROBIC BOTTLES Gram Stain Report Called to,Read Back By and Verified With: DSABRA MOLT ON 06/15/2024 @12 :50 BY T.HAMER Performed at [facility], [address], [place], [place] [address]    Culture (A)  Final    STAPHYLOCOCCUS EPIDERMIDIS SUSCEPTIBILITIES PERFORMED ON PREVIOUS CULTURE WITHIN  THE LAST 5 DAYS. Performed at [facility], [address] [place], [place] [address]    Report Status 06/17/2024 FINAL  Final  Culture, blood (Routine X 2) w Reflex to ID Panel     Status: Abnormal   Collection Time: 06/14/24  7:44 AM   Specimen: BLOOD  Result Value Ref Range Status   Specimen Description   Final     BLOOD LEFT ANTECUBITAL Performed at [facility], [address], [place], [place] [address]    Special Requests   Final    BOTTLES DRAWN AEROBIC AND ANAEROBIC Blood Culture adequate volume Performed at [facility], [address] [place], [place] [address]    Culture  Setup Time   Final    GRAM POSITIVE COCCI IN BOTH AEROBIC AND ANAEROBIC BOTTLES Gram Stain Report Called to,Read Back By and Verified With: B. ROBERTSON ON 06/15/2024 @13 :49 BY T.HAMER Performed at [facility], [address] [place], [place] [address]    Culture (A)  Final    STAPHYLOCOCCUS EPIDERMIDIS SUSCEPTIBILITIES PERFORMED ON PREVIOUS CULTURE WITHIN THE LAST 5 DAYS. Performed at [facility], [address] [place], [place] [address]    Report Status 06/17/2024 FINAL  Final  Culture, blood (Routine X 2) w Reflex to ID Panel     Status: None (Preliminary result)   Collection Time: 06/16/24  2:30 AM   Specimen: BLOOD LEFT ARM  Result Value Ref Range Status   Specimen Description BLOOD LEFT ARM  Final   Special Requests AEROBIC BOTTLE ONLY Blood Culture adequate volume  Final   Culture   Final    NO GROWTH 2 DAYS Performed at [facility], [address], [place], [place] [address]    Report Status PENDING  Incomplete  Culture, blood (Routine X 2) w Reflex to ID Panel     Status: None (Preliminary result)   Collection Time: 06/16/24  2:35 AM   Specimen: Right Antecubital; Blood  Result Value Ref Range Status   Specimen Description RIGHT ANTECUBITAL  Final   Special Requests   Final    BOTTLES DRAWN AEROBIC AND ANAEROBIC Blood Culture adequate volume   Culture   Final    NO GROWTH 2 DAYS Performed at [facility], [address], [place], [place] [address]    Report Status PENDING  Incomplete   I have personally spent 32 minutes involved in face-to-face and non-face-to-face activities for this patient on the day of the visit. Professional time spent includes the following activities: preparing to  see the patient (review of tests), obtaining and reviewing separately obtained history (admission/discharge record), performing a medically appropriate examination, ordering medications, communicating with other health care professionals, documenting clinical information in the EMR, communicating results and counseling patient regarding medication and plan of care, and care coordination.    Greg Samiyyah Moffa, NP [facility]  06/18/2024  2:21 PM

## 2024-06-18 NOTE — Consult Note (Addendum)
 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   History of Present Illness: 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  He is status post aortic valve replacement with a 23 mm Inspiris bioprosthetic valve along with two-vessel coronary bypass grafting with the left internal mammary artery grafted to the left anterior descending coronary artery and saphenous vein grafted to the ramus intermediate coronary artery by Dr. Lucas in November 2024.  He recently underwent robot-assisted prostatectomy by Dr. Patrcia on 05/22/2024.  Prior to that, he had a Foley catheter in place continuously for about 9 months due to frequent hematuria related to his prostate disease.   Kyle Matthews was brought to the emergency room at [facility] via EMS on 06/12/2024 with primary complaint of midsternal chest pain and chills.  Kyle Matthews reported his chest pain had been present for about 2 weeks, was unrelated to exertion and would last for several hours and then resolve spontaneously.  He also noted lower extremity edema about 2 weeks duration and frequent recurring chills with episodes lasting 15 to 20 minutes.   Workup in the emergency room included an initial troponin level of 99 with repeat level of 86.  CTA chest was negative for pulmonary embolus.  EKG did not show any acute ischemic changes.  He was admitted to the hospital and cardiology was consulted.  Kyle Matthews was seen by  Dr. Alvan and echocardiography was recommended.  In the interim, an EEG was also performed to rule out seizure activity as a cause for the patient's shaking episodes. He went on to have an MRI showing a small acute CVA in the left occipital lobe with a concurrent MRA showing no associated vascular occlusion.  By the second hospital day, blood cultures that had been drawn  on admission resulted positive for Staphylococcus epidermidis.  The transthoracic echocardiogram showed elevated gradients across the prosthetic aortic valve and thus transesophageal echo was recommended.  The TEE was performed earlier today by Dr. Oneil Parchment showing left ventricular ejection fraction of 65 to 70%.  The mitral valve had mild annular calcification but no MR.  The prosthetic aortic valve had a 1.15 x 1.29 x 1.66 cm mobile vegetation with a mean aortic valve gradient of 20 mmHg.  The tricuspid valve had a mobile density measuring 1.55 x 0.45 cm with moderate TR.    Currently, Kyle Matthews is afebrile and generally feels much better since the IV vancomycin  was started. He reports no other health issues, wounds, or dental concerns. Follow-up blood cultures obtained yesterday have not yet resulted.  The infectious disease service has been consulted.    Current Activity/ Functional Status: Patient is independent with mobility/ambulation, transfers, ADL's, IADL's.   Zubrod Score: At the time of surgery this patient's most appropriate activity status/level should be described as: []     0    Normal activity, no symptoms [x]     1  Restricted in physical strenuous activity but ambulatory, able to do out light work []     2    Ambulatory and capable of self care, unable to do work activities, up and about                 more than 50%  Of the time                            []     3    Only limited self care, in bed greater than 50% of waking hours []     4    Completely disabled, no self care, confined to bed or chair []     5     Moribund  Past Medical History:  Diagnosis Date   Anemia    Anxiety    Arthritis    BPH (benign prostatic hyperplasia)    Status post biopsy in 2009. - w/ LUTS   Coronary artery disease    a. s/p CABG in 10/2023 with LIMA-LAD and SVG-Ramus at the time of AVR   Depression    Essential hypertension    Hyperlipidemia    On Crestor  - followed by PCP   Moderate calcific aortic stenosis 04/2017   a. s/p AVR with 23 mm Edwards Inspiris Resilia pericardial valve in 10/2023   Stroke (HCC) 2007   No true etiology noted   UTI (urinary tract infection)     Past Surgical History:  Procedure Laterality Date   AORTIC VALVE REPLACEMENT N/A 11/02/2023   Procedure: AORTIC VALVE REPLACEMENT (AVR) USING 23 MM INSPIRIS RESILIA AORTIC VALVE;  Surgeon: Lucas Dorise POUR, MD;  Location: [facility] OR;  Service: Open Heart Surgery;  Laterality: N/A;   CORONARY ARTERY BYPASS GRAFT N/A 11/02/2023   Procedure: CORONARY ARTERY BYPASS GRAFTING (CABG) X TWO USING LEFT INTERNAL MAMMARY ARTERY AND RIGHT GREATER SAPHENEOUS VEIN HARVESTED ENDOSCOPICLY;  Surgeon: Lucas Dorise POUR, MD;  Location: [facility] OR;  Service: Open Heart Surgery;  Laterality: N/A;   CORONARY PRESSURE/FFR STUDY N/A 10/09/2023   Procedure: CORONARY PRESSURE/FFR STUDY;  Surgeon: Wendel Lurena POUR, MD;  Location: [facility] INVASIVE CV LAB;  Service: Cardiovascular;  Laterality: N/A;   IR ANGIOGRAM PELVIS SELECTIVE OR SUPRASELECTIVE  10/27/2023   IR ANGIOGRAM PELVIS SELECTIVE OR SUPRASELECTIVE  10/27/2023   IR ANGIOGRAM SELECTIVE EACH ADDITIONAL VESSEL  10/27/2023   IR ANGIOGRAM SELECTIVE EACH ADDITIONAL VESSEL  10/27/2023   IR ANGIOGRAM SELECTIVE EACH ADDITIONAL VESSEL  10/27/2023   IR ANGIOGRAM SELECTIVE EACH ADDITIONAL VESSEL  10/27/2023   IR EMBO ARTERIAL NOT HEMORR HEMANG INC GUIDE ROADMAPPING  10/27/2023   IR US  GUIDE VASC ACCESS RIGHT  10/27/2023   RIGHT/LEFT HEART CATH AND CORONARY ANGIOGRAPHY N/A 10/09/2023   Procedure: RIGHT/LEFT HEART CATH AND CORONARY ANGIOGRAPHY;   Surgeon: Wendel Lurena POUR, MD;  Location: [facility] INVASIVE CV LAB;  Service: Cardiovascular;  Laterality: N/A;   TEE WITHOUT CARDIOVERSION N/A 11/02/2023   Procedure: TRANSESOPHAGEAL ECHOCARDIOGRAM;  Surgeon: Lucas Dorise POUR, MD;  Location: [facility] OR;  Service: Open Heart Surgery;  Laterality: N/A;   TRANSTHORACIC ECHOCARDIOGRAM  04/28/2017    EF 65-70%. GR 1 DD. No regional wall motion abnormality. Moderate aortic stenosis with moderate regurgitation. Mean gradient 34 mmHg.   XI ROBOTIC ASSISTED SIMPLE PROSTATECTOMY N/A 05/22/2024   Procedure: PROSTATECTOMY, SIMPLE, ROBOT-ASSISTED;  Surgeon: Alvaro Ricardo KATHEE Mickey., MD;  Location: [facility] ORS;  Service: Urology;  Laterality: N/A;    Social History   Tobacco  Use  Smoking Status Every Day   Current packs/day: 0.00   Average packs/day: 1.5 packs/day for 24.0 years (36.0 ttl pk-yrs)   Types: Cigarettes   Last attempt to quit: 08/21/2023   Years since quitting: 0.8  Smokeless Tobacco Never    Social History   Substance and Sexual Activity  Alcohol Use Not Currently   Alcohol/week: 6.0 standard drinks of alcohol   Types: 6 Cans of beer per week     Allergies  Allergen Reactions   Tetanus Toxoids Other (See Comments)    Childhood Allergy    Zetia  [Ezetimibe ] Diarrhea    Current Facility-Administered Medications  Medication Dose Route Frequency Provider Last Rate Last Admin   acetaminophen  (TYLENOL ) tablet 650 mg  650 mg Oral Q6H PRN Emokpae, Ejiroghene E, MD   650 mg at 06/13/24 1735   Or   acetaminophen  (TYLENOL ) suppository 650 mg  650 mg Rectal Q6H PRN Emokpae, Ejiroghene E, MD       aspirin  chewable tablet 81 mg  81 mg Oral Daily Emokpae, Ejiroghene E, MD   81 mg at 06/18/24 1033   finasteride  (PROSCAR ) tablet 5 mg  5 mg Oral QPM Emokpae, Ejiroghene E, MD   5 mg at 06/17/24 1549   furosemide  (LASIX ) tablet 20 mg  20 mg Oral Daily PRN Emokpae, Ejiroghene E, MD       ondansetron  (ZOFRAN ) tablet 4 mg  4 mg Oral Q6H PRN Emokpae, Ejiroghene E, MD        Or   ondansetron  (ZOFRAN ) injection 4 mg  4 mg Intravenous Q6H PRN Emokpae, Ejiroghene E, MD       pantoprazole  (PROTONIX ) injection 40 mg  40 mg Intravenous Q24H Emokpae, Ejiroghene E, MD   40 mg at 06/17/24 2325   polyethylene glycol (MIRALAX  / GLYCOLAX ) packet 17 g  17 g Oral Daily PRN Emokpae, Ejiroghene E, MD       rosuvastatin  (CRESTOR ) tablet 40 mg  40 mg Oral Daily Emokpae, Ejiroghene E, MD   40 mg at 06/18/24 1033   vancomycin  (VANCOCIN ) IVPB 1000 mg/200 mL premix  1,000 mg Intravenous Q12H Tat, David, MD 200 mL/hr at 06/18/24 1224 1,000 mg at 06/18/24 1224    Medications Prior to Admission  Medication Sig Dispense Refill Last Dose/Taking   amLODipine  (NORVASC ) 5 MG tablet Take 1 tablet (5 mg total) by mouth daily. 30 tablet 0 06/11/2024 Morning   aspirin  81 MG chewable tablet Chew 81 mg by mouth daily. Swallow whole.   06/12/2024 at  4:15 PM   ciprofloxacin  (CIPRO ) 500 MG tablet Take 500 mg by mouth 2 (two) times daily.   06/11/2024 Bedtime   docusate sodium  (COLACE) 100 MG capsule Take 1 capsule (100 mg total) by mouth 2 (two) times daily. (Patient taking differently: Take 100 mg by mouth daily as needed for mild constipation.)   Past Month   Ferrous Gluconate (IRON  27 PO) Take 27 mg by mouth daily.   06/11/2024 Morning   finasteride  (PROSCAR ) 5 MG tablet Take 5 mg by mouth every evening.   06/11/2024 Bedtime   furosemide  (LASIX ) 20 MG tablet TAKE 1 TABLET(20 MG) BY MOUTH DAILY AS NEEDED (Patient taking differently: Take 20 mg by mouth daily as needed for fluid or edema.) 30 tablet 8 06/11/2024 Morning   nitroGLYCERIN  (NITROSTAT ) 0.4 MG SL tablet Place 1 tablet (0.4 mg total) under the tongue every 5 (five) minutes as needed for chest pain. 25 tablet 3 06/12/2024 at  4:15 PM   rosuvastatin  (  CRESTOR ) 40 MG tablet Take 40 mg by mouth daily.   06/11/2024 Morning    Family History  Problem Relation Age of Onset   Coronary artery disease Mother 36       33. Was diagnosed with a blood clot  - suspect that this was a STEMI   Lung cancer Father    Thyroid  disease Sister    Healthy Brother 35   Pancreatic cancer Sister 72   Cancer Maternal Grandmother    Heart disease Maternal Grandfather 1   Cancer Paternal Grandmother    Stroke Paternal Grandfather      Review of Systems:       Cardiac Review of Systems: Y or  [    ]= no  Chest Pain [ present on admission,  now resolved   ]  Resting SOB [   ] Exertional SOB  [  ]  Orthopnea [  ]   Pedal Edema [   ]    Palpitations [  ] Syncope  [  ]   Presyncope [   ]  General Review of Systems: [Y] = yes [  ]=no Constitional: recent weight change [  ]; anorexia [  ]; fatigue [ x ]; nausea [  ]; night sweats [  ]; fever [  ]; or chills [ no resolved ] ]                                                             Dental: NO current dental concerns  Eye : blurred vision [  ]; diplopia [   ]; vision changes [  ];  Amaurosis fugax[  ]; Resp: cough [  ];  wheezing[  ];  hemoptysis[  ]; shortness of breath[  ]; paroxysmal nocturnal dyspnea[  ]; dyspnea on exertion[  ]; or orthopnea[  ];  GI:  gallstones[  ], vomiting[ x ];  dysphagia[  ]; melena[  ];  hematochezia [  ]; heartburn[  ];   Hx of  Colonoscopy[  ]; GU: kidney stones [  ]; hematuria[  ];   dysuria [  ];  nocturia[  ];  history of     obstruction [  ]; urinary frequency [  ]             Skin: rash [ right neck, chin, right back ];, hair loss[  ];  peripheral edema[  ];  or itching[  ]; Musculosketetal: myalgias[  ];  joint swelling[  ];  joint erythema[  ];  joint pain[  ];  back pain[  ];  Heme/Lymph: bruising[  ];  bleeding[  ];  anemia[  ];  Neuro: TIA[  ];  headaches[  ];  stroke[  ];  vertigo[  ];  seizures[  ];   paresthesias[  ];  difficulty walking[  ];  Psych:depression[  ]; anxiety[  ];  Endocrine: diabetes[  ];  thyroid  dysfunction[  ];              Physical Exam: BP 124/69 (BP Location: Left Arm)   Pulse 79   Temp 98.7 F (37.1 C) (Oral)   Resp 18   Ht 5' 2  (1.575 m)   Wt 69.5 kg   SpO2 97%   BMI 28.02 kg/m  General appearance: alert, cooperative, and no distress.  There are a few erythematous, macular lesions noted on the chin, right neck, and right back.  There is no skin breakdown or drainage. Head: Normocephalic, without obvious abnormality, atraumatic Neck: no adenopathy, no carotid bruit, no JVD, and supple, symmetrical, trachea midline Lymph nodes: No cervical or clavicular adenopathy Resp: Normal respiratory effort on room air, breath sounds are full, equal, and clear to auscultation Cardio: Regular rate and rhythm, soft systolic murmur heard throughout the precordium consistent with a bioprosthetic aortic valve GI: Soft, no tenderness.  Well-healed port incisions at several positions in the lower abdomen Extremities: All well-perfused with palpable distal pulses.  No tenderness and no hemorrhages. Neurologic: Grossly normal  Diagnostic Studies & Laboratory data:   TRANSESOPHOGEAL ECHO REPORT       Patient Name:   Kyle Matthews Date of Exam: 06/18/2024 Medical Rec #:  [id number]  Height:       62.0 in Accession #:    [id number] Weight:       153.2 lb Date of Birth:  [birth year]   BSA:          1.707 m Patient Age:    77 years   BP:           118/60 mmHg Patient Gender: M          HR:           94 bpm. Exam Location:  Inpatient  Procedure: 3D Echo, Transesophageal Echo, Cardiac Doppler and Color Doppler            (Both Spectral and Color Flow Doppler were utilized during            procedure).  Indications:     Endocarditis   History:         Patient has prior history of Echocardiogram examinations, most                  recent 06/13/2024. AVR Inspiris Resilia prosthesis 11/02/23.                  Aortic Valve: unknown Inspiris Resilia valve is present in the                  aortic position. Procedure Date: 11/02/23.   Sonographer:     Tinnie Gosling RDCS Referring Phys:  [id number] LORETTE CINDERELLA KAPUR Diagnosing Phys: Oneil Parchment MD  PROCEDURE: TEE procedure time was 30 minutes. The transesophogeal probe was passed without difficulty through the esophogus of the patient. Imaged were obtained with the patient in a left lateral decubitus position. Sedation performed by different physician. Image quality was good. The patient's vital signs; including heart rate, blood pressure, and oxygen saturation; remained stable throughout the procedure. The patient developed no complications during the procedure.   IMPRESSIONS    1. Left ventricular ejection fraction, by estimation, is 65 to 70%. The left ventricle has normal function. The left ventricle has no regional wall motion abnormalities.  2. Right ventricular systolic function is normal. The right ventricular size is normal.  3. No left atrial/left atrial appendage thrombus was detected.  4. The mitral valve is normal in structure. No evidence of mitral valve regurgitation. No evidence of mitral stenosis.  5. 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  6. Mobile mass measuring 1.15cm x 1.29cm with a 1.66 cm mobile tail - bioprosthetic valve endocarditis.  The aortic valve has been repaired/replaced. Aortic valve regurgitation is trivial. No aortic stenosis is present. There is a unknown Inspiris Resilia valve present in the aortic position. Procedure Date: 11/02/23. Aortic valve mean gradient measures 20.0 mmHg. Aortic valve Vmax measures 2.90 m/s.  7. The inferior vena cava is normal in size with greater than 50% respiratory variability, suggesting right atrial pressure of 3 mmHg.  8. 3D performed of the aortic valve and demonstrates AV vegetation.  Conclusion(s)/Recommendation(s): Findings are concerning for vegetation/infective endocarditis as detailed above. Discussed with consultation team.  FINDINGS  Left Ventricle: Left ventricular  ejection fraction, by estimation, is 65 to 70%. The left ventricle has normal function. The left ventricle has no regional wall motion abnormalities. The left ventricular internal cavity size was normal in size. There is  no left ventricular hypertrophy.  Right Ventricle: The right ventricular size is normal. No increase in right ventricular wall thickness. Right ventricular systolic function is normal.  Left Atrium: Left atrial size was normal in size. No left atrial/left atrial appendage thrombus was detected.  Right Atrium: Right atrial size was normal in size.  Pericardium: There is no evidence of pericardial effusion.  Mitral Valve: The mitral valve is normal in structure. Mild mitral annular calcification. No evidence of mitral valve regurgitation. No evidence of mitral valve stenosis.  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 No evidence of tricuspid stenosis.  Aortic Valve: Mobile mass measuring 1.15cm x 1.29cm with a 1.66 cm mobile tail - bioprosthetic valve endocarditis. The aortic valve has been repaired/replaced. Aortic valve regurgitation is trivial. No aortic stenosis is present. Aortic valve mean gradient measures 20.0 mmHg. Aortic valve peak gradient measures 33.6 mmHg. There is a unknown Inspiris Resilia valve present in the aortic position. Procedure Date: 11/02/23.  Pulmonic Valve: The pulmonic valve was normal in structure. Pulmonic valve regurgitation is not visualized. No evidence of pulmonic stenosis.  Aorta: The aortic root is normal in size and structure.  Venous: The inferior vena cava is normal in size with greater than 50% respiratory variability, suggesting right atrial pressure of 3 mmHg.  IAS/Shunts: No atrial level shunt detected by color flow Doppler.  Additional Comments: 3D was performed not requiring image  post processing on an independent workstation and was abnormal.  LEFT VENTRICLE PLAX 2D LVOT diam:     2.00 cm LVOT Area:     3.14 cm    AORTIC VALVE AV Vmax:      290.00 cm/s AV Vmean:     202.000 cm/s AV VTI:       0.497 m AV Peak Grad: 33.6 mmHg AV Mean Grad: 20.0 mmHg   AORTA Ao Root diam: 3.60 cm Ao Asc diam:  3.20 cm    SHUNTS Systemic Diam: 2.00 cm  Oneil Parchment MD Electronically signed by Oneil Parchment MD Signature Date/Time: 06/18/2024/10:17:10 AM       Final           Recent Radiology Findings:   CLINICAL DATA:  seizure like activity   EXAM: MRI HEAD WITHOUT CONTRAST   MRA HEAD WITHOUT CONTRAST   TECHNIQUE: Multiplanar, multi-echo pulse sequences of the brain and surrounding structures were acquired without intravenous contrast. Angiographic images of the Circle of Willis were acquired using MRA technique without intravenous contrast.   COMPARISON:  CT head 15 2007.   FINDINGS: MRI HEAD FINDINGS   Brain: Small acute infarct  in the left occipital lobe. No significant mass effect or midline shift. Additional patchy T2/FLAIR hyperintensity in the white matter compatible with microvascular ischemic disease. No evidence of acute hemorrhage, mass lesion, or hydrocephalus.   Vascular: See below.   Skull and upper cervical spine: Normal marrow signal.   Sinuses/Orbits: Mostly clear sinuses.  No acute orbital findings.   Other: No mastoid effusions.   MRA HEAD FINDINGS   Anterior circulation: Bilateral intracranial ICAs, MCAs, and ACAs are patent without proximal hemodynamically significant stenosis.   Posterior circulation:Bilateral intradural vertebral arteries, basilar artery and bilateral posterior cerebral arteries are patent without proximally in amic least significant stenosis.   IMPRESSION: 1. 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 2. No emergent large vessel occlusion or proximal hemodynamically significant stenosis.      Electronically Signed   By: Gilmore GORMAN Molt M.D.   On: 06/13/2024 16:11   I have independently reviewed the above radiologic studies and discussed with the patient   Recent Lab Findings: Lab Results  Component Value Date   WBC 11.5 (H) 06/17/2024   HGB 10.1 (L) 06/17/2024   HCT 29.8 (L) 06/17/2024   PLT 204 06/17/2024   GLUCOSE 91 06/18/2024   CHOL 124 06/14/2024   TRIG 91 06/14/2024   HDL 40 (L) 06/14/2024   LDLCALC 66 06/14/2024   ALT 12 06/12/2024   AST 19 06/12/2024   NA 134 (L) 06/18/2024   K 3.3 (L) 06/18/2024   CL 102 06/18/2024   CREATININE 0.62 06/18/2024   BUN 9 06/18/2024   CO2 24 06/18/2024   TSH 0.937 06/12/2024   INR 1.1 06/12/2024   HGBA1C 5.1 06/13/2024      Assessment / Plan:      - 78 year old male with the above described past medical history who was status post bioprosthetic aortic valve replacement with a 23 mm Inspiris Resilia valve in November 2024 along with two-vessel CABG now presents with 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  Initial blood cultures were positive for Staphylococcus epidermidis.  His symptoms of chest pain and frequent rigors have resolved since initiating IV vancomycin  on 06/15/2024.  The prosthetic aortic valve is functioning appropriately with no significant aortic insufficiency or stenosis noted on the TEE.  There was moderate tricuspid valve insufficiency noted on the TEE.  Infectious disease consultation has been requested and is pending at this time. A small, acute occipital lobe infarct was noted on MRI obtained during this admission.  The patient has had no known neurologic deficit to correlate with this finding. Medical management of prosthetic aortic valve and native tricuspid valve endocarditis is appropriate at this time.  Mr. Gallier is now afebrile and has remained hemodynamically stable.  Dr. Lucas will review the clinical data and imaging and will comment further regarding recommendations for  management.  -History of benign prostatic hyperplasia.  Status post robotic assisted prostatectomy about 4 weeks ago.  Prior to that, he had a Foley catheter in place for about 9 months with monthly catheter changes.  He had no difficulty voiding after removal of the catheter about 2 weeks ago until he was started on heparin  briefly during this admission.  The heparin  resulted in hematuria with passage of clots that have since resolved after stopping the heparin  a few days ago.  -Acute occipital lobe CVA: This is a small lesion with no measurable neurologic deficit.  Mr. Hennington does have a history of 2 prior strokes in the late 90s and also in 2007.  Most recent stroke was treated with lytic therapy with resolution of neurologic symptoms within hours of treatment.  -Rash: This appeared about the time of the recent prostate surgery.  He said it is mildly pruritic with lesions on the chin, right neck, and right back.  I  spent 30 minutes counseling the patient face to face.   Kyle G. Roddenberry, PA-C  06/18/2024 12:52 PM    Chart reviewed, patient examined, agree with above.  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 He had an indwelling foley for 9 months which was just removed a couple weeks ago after prostatectomy. MRI brain showed a small acute occipital infarct with no deficit. He has clinically responded to antibiotics with no signs of sepsis and last set of BC from 6/29 are negative after + BC on 6/25 and 6/27. He has mobile vegetation on the aortic valve measured at 1.15 x 1.29 cm with an increase in the valve gradient from 12 postop to 28 on 2D echo and 20 by TEE. No sign of perivalvular abscess. No AI, moderate TR with mobile vegetation. No sign of PE or septic pulmonary emboli on CT. He looks good clinically. I think there are certainly multiple indications for redo AVR in this pt including likely embolic stroke with mobile  vegetation > 1cm, prosthetic valve infection with resistant bacteria, increased valve gradient. His operative risk is certainly increased at 77 but still fairly low and I think his risk for further complications with only antibiotic therapy are higher and long term results less predictable. Tricuspid valve may also require replacement depending on intra-op findings. He would require preop cath. I discussed all of this with him and he is going to discuss with his wife. I will check back with him tomorrow to see what other questions come up.  Dorise LOIS Fellers, MD

## 2024-06-18 NOTE — Anesthesia Postprocedure Evaluation (Signed)
 Anesthesia Post Note  Patient: Jos Cygan  Procedure(s) Performed: TRANSESOPHAGEAL ECHOCARDIOGRAM     Patient location during evaluation: PACU Anesthesia Type: MAC Level of consciousness: awake and alert Pain management: pain level controlled Vital Signs Assessment: post-procedure vital signs reviewed and stable Respiratory status: spontaneous breathing, nonlabored ventilation, respiratory function stable and patient connected to nasal cannula oxygen Cardiovascular status: stable and blood pressure returned to baseline Postop Assessment: no apparent nausea or vomiting Anesthetic complications: no  No notable events documented.  Last Vitals:  Vitals:   06/18/24 0905 06/18/24 0923  BP: 110/60 109/68  Pulse: 78 76  Resp: 15 16  Temp:  36.6 C  SpO2: 95% 97%    Last Pain:  Vitals:   06/18/24 0923  TempSrc: Oral  PainSc: 0-No pain                 Franky JONETTA Bald

## 2024-06-18 NOTE — CV Procedure (Signed)
   Transesophageal Echocardiogram  Indications: 23 mm Edwards Inspiris Resilia pericardial valve placed in November 2024 with increased transvalvular gradient.  Time out performed  During this procedure the patient was administered propofol  under anesthesiology supervision to achieve and maintain moderate sedation.  The patient's heart rate, blood pressure, and oxygen saturation are monitored continuously during the procedure.   Findings:  Left Ventricle: Ejection fraction 65 to 70%, normal  Mitral Valve: Mild mitral valve annular calcification, no MR  Aortic Valve: 23 mm Edwards Inspiris Resilia pericardial valve, November 2024, with mobile valvular vegetation 1.5 x 1.5 cm with a 1.5 cm tail.  Mean valvular gradient 20 mmHg.  Trivial aortic regurgitation.  See report for details.  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 Recommend ID consult, CT surgery consult Blood culture positive for Staph epidermidis  Oneil Parchment, MD

## 2024-06-18 NOTE — Anesthesia Preprocedure Evaluation (Addendum)
 Anesthesia Evaluation  Patient identified by MRN, date of birth, ID band Patient awake    Reviewed: Allergy & Precautions, NPO status , Patient's Chart, lab work & pertinent test results  Airway Mallampati: I  TM Distance: >3 FB Neck ROM: Full    Dental  (+) Teeth Intact, Dental Advisory Given   Pulmonary Current Smoker and Patient abstained from smoking.   breath sounds clear to auscultation       Cardiovascular hypertension, Pt. on medications + CAD, + Past MI, + CABG and +CHF  + dysrhythmias + Valvular Problems/Murmurs AS  Rhythm:Regular Rate:Normal  - AS s/p AVR   Neuro/Psych  PSYCHIATRIC DISORDERS Anxiety Depression    CVA    GI/Hepatic Neg liver ROS,GERD  ,,  Endo/Other  negative endocrine ROS    Renal/GU Renal disease     Musculoskeletal  (+) Arthritis ,    Abdominal   Peds  Hematology  (+) Blood dyscrasia, anemia   Anesthesia Other Findings   Reproductive/Obstetrics                             Anesthesia Physical Anesthesia Plan  ASA: 3  Anesthesia Plan: MAC   Post-op Pain Management: Minimal or no pain anticipated   Induction: Intravenous  PONV Risk Score and Plan: 0 and Propofol  infusion  Airway Management Planned: Natural Airway and Nasal Cannula  Additional Equipment: None  Intra-op Plan:   Post-operative Plan:   Informed Consent: I have reviewed the patients History and Physical, chart, labs and discussed the procedure including the risks, benefits and alternatives for the proposed anesthesia with the patient or authorized representative who has indicated his/her understanding and acceptance.       Plan Discussed with: CRNA  Anesthesia Plan Comments:        Anesthesia Quick Evaluation

## 2024-06-18 NOTE — Anesthesia Procedure Notes (Signed)
 Procedure Name: MAC Date/Time: 06/18/2024 8:00 AM  Performed by: Arvell Edsel HERO, CRNAPre-anesthesia Checklist: Emergency Drugs available, Patient identified, Suction available and Patient being monitored Patient Re-evaluated:Patient Re-evaluated prior to induction Oxygen Delivery Method: Simple face mask

## 2024-06-18 NOTE — Progress Notes (Signed)
 PROGRESS NOTE    Kyle Matthews  FMW:[id number] DOB: July 01, 1946 DOA: 06/12/2024 PCP: Verena Mems, MD  Brief Narrative:78 year old male with a history of coronary artery disease status post CABG 10/2023, AS s/p bioprosthetic AVR, diastolic dysfunction, stroke, postoperative atrial fibrillation post CABG, HTN, HLD, BPH with urinary retention status post robotic assisted laparoscopic simple prostatectomy 05/22/2024, hypertension, hyperlipidemia, presenting with midsternal chest discomfort subjective chills and rigors. Regarding the chest discomfort, the patient stated that the sensation along his mid sternum evening of 06/11/2024 while watching television.  He went to bed and woke up on the morning of 06/12/2024 with the same burning midsternal chest discomfort.  Around noon time he had an episode of chills and shaking which resulted in an episode of nausea and vomiting.  This was after he ate some cereal.  His chest discomfort continued.  As result, he presented for further evaluation and treatment. He denies any headache, neck pain coughing, hemoptysis, hematemesis, pain, dysuria, hematuria, hematochezia, melena.   Regarding his chills and shaking episodes, the patient states that he has never had a temperature greater than 98.9 Fahrenheit.  He states the episodes usually last about 15- 20 minutes.  He feels cold and wraps himself up in the blanket.  He states that he is able to somewhat suppress his shaking, but is no completely suppressed.  He does not lose consciousness.  He states his bilateral arms> bilateral legs shake.  He is able to speak and communicate during these episodes.  He states that he has had these intermittent episodes since his CABG 10/2023.  However he has had 4 episodes past 2 to 3 weeks.  He denies any headache, visual disturbance, focal extremity weakness coughing,, dysuria, hematuria.   Notably, the patient went to see his outpatient provider on 06/06/2024 he was placed on  ciprofloxacin  empirically.  The patient states that he took 6 days.  His last dose was 06/11/24.   The patient elected temperature 99.0 F.  He was initially tachycardic up to 122.  Saturation was 97% room air.  WBC 14.0, hemoglobin 10.8, platelets 177.  Sodium 134, potassium 3.7, bicarbonate 23, serum creatinine 0.70. Cardiology was consulted to assist with management.     In the ED, the patient had a low-grade temperature 100 F.  He was hemodynamically stable.  Oxygen saturation WBC 14.0, platelets 176.  BMP showed sodium 134, potassium 3.7, bicarbonate 23, serum creatinine 0.70 LFTs unremarkable.  BNP 169.0.  99>> 86.  EKG showed sinus rhythm with right bundle branch block.  CTA chest was negative for PE.  There is mild emphysematous change.  There is no infiltrates or effusions. Patient was initially placed on IV heparin  which was stopped after 18 hours after ACS and thromboembolic events were ruled out.   He developed hematuria which has overall improved.   Blood cultures grew MRSE in 4/4 sets.  He was started on IV vancomycin .  TTE showed some abnormality with the AV gradient.  Plan it to obtain TEE (unavailable at [facility] this week).  ID has been consulted and Dr. Fleeta Rothman will see.     Assessment & Plan:   Principal Problem:   Chest pain Active Problems:   Chronic heart failure with preserved ejection fraction (HFpEF) (HCC)   BPH with obstruction/lower urinary tract symptoms   Essential hypertension   S/P CABG x 2   S/P AVR (aortic valve replacement)   SIRS (systemic inflammatory response syndrome) (HCC)   Bacteremia   Acute CVA (cerebrovascular accident) (HCC)  Acute bacterial endocarditis   Chest pain/elevated troponin -troponins flat 99>>86 -cardiology consult appreciated -echo-EF 55-60%, normal RVF, mod-severe TR, mild MR--mean gradient is increased (12 to 28 mm Hg) and  DVI is decreased (0.55 to 0.41) Cannot exclude obstruction to prosthesis.  -continue aspirin  and statin -  No further chest pain   SIRS/Chills/Rigors/Shaking -Patient presented with leukocytosis and tachycardia - EEG--no seizure - 6/25 blood culture--Staph epi 2/2 sets - Check lactic acid 1.1 - Check PCT <0.10 - The patient has been on ciprofloxacin  which may compromise cultures - random cortisol--12.4 - MR brain--Small acute infarct in the left occipital lobe  - TSH 0.937 -urine culture < 10 K organisms - overall improved   Staph Epi Bacteremia -- 6/25 blood culture--Staph epi 2/2 sets -repeat blood culture 6/27--GPC 2/2 sets -started empiric vanc 6/27>> -repeat blood cultures in am 6/29--neg to date -ID consulted--Dr. 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  -LDL--66 -HbA1C--5.1 -Antiplatelet--Aspirin  81 mg daily -06/14/24--discussed with Dr. Shelton   Hematuria -started after IV heparin  (6/25-6/26) -stopped IV heparin  after about 18 hours -discussed with urology, Dr. Alvaro -expects bleeding to gradually slowed down although may continue to have waxing and waning episodes of worsening -IV heparin  stopped - Patient is able to urinate freely -Continue finasteride  -overall has improved, Hgb remains stable   BPH -s/p laparoscopic prostatectomy 05/22/2024 - Foley catheter was removed 06/03/2024 - Able to urinate without difficulty - continue finasteride    Mixed Hyperlipidemia -continue statin    Elevated D-dimer -CTA chest neg for PE -venous duplex legs--neg   Essential HTN -hold amlodipine  temporarily -BP remains well-controlled    Estimated body mass index is 28.02 kg/m as calculated from the following:   Height as of this encounter: 5' 2 (1.575 m).   Weight as of this encounter: 69.5 kg.  DVT prophylaxis:  Code Status: full  Family Communication: none Disposition Plan:  Status is: Inpatient Remains inpatient appropriate because: acute illness   Consultants: Infectious disease, cardiology  Procedures: Transesophageal echocardiogram 7/1 Antimicrobials  Anti-infectives (From admission, onward)    Start     Dose/Rate Route Frequency Ordered Stop   06/18/24 0000  vancomycin  (VANCOCIN ) IVPB 1000 mg/200 mL premix        1,000 mg 200 mL/hr over 60 Minutes Intravenous Every 12 hours 06/17/24 1115     06/15/24 1000  vancomycin  (VANCOREADY) IVPB 1250 mg/250 mL  Status:  Discontinued        1,250 mg 166.7 mL/hr over 90 Minutes Intravenous Every 24 hours 06/14/24 1029 06/17/24 1115   06/14/24 0845  vancomycin  (VANCOREADY) IVPB 1500 mg/300 mL        1,500 mg 150 mL/hr over 120 Minutes Intravenous  Once 06/14/24 0747 06/14/24 1133   06/13/24 0000  ciprofloxacin  (CIPRO ) tablet 500 mg  Status:  Discontinued        500 mg Oral 2 times daily 06/12/24 2311 06/13/24 0854       Subjective:  Resting in bed saw him after TEE dw him findings  Objective: Vitals:   06/18/24 0900 06/18/24 0905 06/18/24 0923 06/18/24 1139  BP: 104/60 110/60 109/68 124/69  Pulse: 79 78 76 79  Resp: 17 15 16 18   Temp:   97.8 F (36.6 C) 98.7 F (37.1 C)  TempSrc:   Oral Oral  SpO2: 94% 95% 97% 97%  Weight:      Height:        Intake/Output Summary (Last 24 hours) at 06/18/2024 1329 Last data filed at 06/18/2024 1300 Gross per 24 hour  Intake 1407 ml  Output 201 ml  Net 1206 ml   Filed Weights   06/12/24 1657 06/12/24 2330  Weight: 71 kg 69.5 kg    Examination:  General exam:  Appears in nad Respiratory system: Clear to auscultation. Respiratory effort normal. Cardiovascular system: systolic murmer Gastrointestinal system: Abdomen is nondistended, soft and nontender. No organomegaly or masses felt. Normal bowel sounds heard. Central nervous system: Alert and oriented. No focal neurological deficits. Extremities: no edema  Data Reviewed: I have personally reviewed following labs and imaging studies  CBC: Recent Labs  Lab 06/12/24 1819 06/13/24 0454 06/14/24 0426 06/15/24 0306 06/16/24 0230 06/17/24 0312  WBC 14.0* 14.0* 14.2* 11.3* 14.0* 11.5*  NEUTROABS 11.8*  --   --   --   --   --   HGB 10.8* 10.8* 10.5* 9.7* 11.3* 10.1*  HCT 32.8* 32.5* 31.0* 30.2* 35.0* 29.8*  MCV 89.1 89.0 90.9 91.2 91.9 90.0  PLT 176 177 207 188 229 204   Basic Metabolic Panel: Recent Labs  Lab 06/13/24 0454 06/13/24 0937 06/14/24 0426 06/15/24 0306 06/17/24 0312 06/18/24 0441  NA 135  --  132* 134* 134* 134*  K 3.1*  --  3.8 3.8 3.4* 3.3*  CL 100  --  99 100 102 102  CO2 25  --  24 26 22 24   GLUCOSE 105*  --  114* 92 92 91  BUN 12  --  11 15 9 9   CREATININE 0.64  --  0.75 0.76 0.59* 0.62  CALCIUM  8.6*  --  8.4* 8.3* 8.2* 8.3*  MG  --  1.9 1.9 2.0  --   --    GFR: Estimated Creatinine Clearance: 68.4 mL/min (by C-G formula based on SCr of 0.62 mg/dL). Liver Function Tests: Recent Labs  Lab 06/12/24 1819  AST 19  ALT 12  ALKPHOS 64  BILITOT 0.8  PROT 6.5  ALBUMIN  3.1*   No results for input(s): LIPASE, AMYLASE in the last 168 hours. No results for input(s): AMMONIA in the last 168 hours. Coagulation Profile: Recent Labs  Lab 06/12/24 1819  INR 1.1   Cardiac Enzymes: Recent Labs  Lab 06/13/24 0937  CKTOTAL 12*   BNP (last 3 results) No results for input(s): PROBNP in the last 8760 hours. HbA1C: No results for input(s): HGBA1C in the last 72 hours. CBG: No results for input(s): GLUCAP in the last 168 hours. Lipid Profile: No  results for input(s): CHOL, HDL, LDLCALC, TRIG, CHOLHDL, LDLDIRECT in the last 72 hours. Thyroid  Function Tests: No results for input(s): TSH, T4TOTAL, FREET4, T3FREE, THYROIDAB in the last 72 hours. Anemia Panel: No results for input(s): VITAMINB12, FOLATE, FERRITIN, TIBC, IRON , RETICCTPCT in the last 72 hours. Sepsis Labs: Recent Labs  Lab 06/13/24 0937  PROCALCITON <0.10  LATICACIDVEN 1.1    Recent Results (from the past 240 hours)  Culture, blood (Routine X 2) w Reflex to ID Panel     Status: Abnormal   Collection Time: 06/12/24 10:55 PM  Specimen: BLOOD  Result Value Ref Range Status   Specimen Description   Final    BLOOD BLOOD LEFT ARM Performed at [facility], [address], [place], [place] [address]    Special Requests   Final    BOTTLES DRAWN AEROBIC AND ANAEROBIC Blood Culture adequate volume Performed at [facility], [address], [place], [place] [address]    Culture  Setup Time   Final    GRAM POSITIVE COCCI IN BOTH AEROBIC AND ANAEROBIC BOTTLES CRITICAL VALUE NOTED.  VALUE IS CONSISTENT WITH PREVIOUSLY REPORTED AND CALLED VALUE. JESSICA ELLER AT 2315 06/13/24 BY A. SNYDER GRAM STAIN REVIEWED-AGREE WITH RESULT    Culture (A)  Final    STAPHYLOCOCCUS EPIDERMIDIS SUSCEPTIBILITIES PERFORMED ON PREVIOUS CULTURE WITHIN THE LAST 5 DAYS. Performed at [facility], [address], [place], [place] [address]    Report Status 06/17/2024 FINAL  Final  Culture, blood (Routine X 2) w Reflex to ID Panel     Status: Abnormal   Collection Time: 06/12/24 10:57 PM   Specimen: BLOOD LEFT HAND  Result Value Ref Range Status   Specimen Description   Final    BLOOD LEFT HAND Performed at [facility], [address], [place], [place] [address]    Special Requests   Final    BOTTLES DRAWN AEROBIC AND ANAEROBIC Blood Culture adequate volume Performed at [facility], [address] [place], [place] [address]    Culture  Setup  Time   Final    GRAM POSITIVE COCCI IN BOTH AEROBIC AND ANAEROBIC BOTTLES Gram Stain Report Called to,Read Back By and Verified With: JESSICA ELLER,RN AT 2315 06/13/24 BY A. SNYDER CRITICAL RESULT CALLED TO, READ BACK BY AND VERIFIED WITH: LOISE MORONES RN 06/14/2024 @ 0414 BY AB GRAM STAIN REVIEWED-AGREE WITH RESULT Performed at [facility], [address] [place], [place] [address]    Culture STAPHYLOCOCCUS EPIDERMIDIS (A)  Final   Report Status 06/17/2024 FINAL  Final   Organism ID, Bacteria STAPHYLOCOCCUS EPIDERMIDIS  Final      Susceptibility   Staphylococcus epidermidis - MIC*    CIPROFLOXACIN  >=8 RESISTANT Resistant     ERYTHROMYCIN >=8 RESISTANT Resistant     GENTAMICIN <=0.5 SENSITIVE Sensitive     OXACILLIN >=4 RESISTANT Resistant     TETRACYCLINE >=16 RESISTANT Resistant     VANCOMYCIN  <=0.5 SENSITIVE Sensitive     TRIMETH/SULFA 160 RESISTANT Resistant     CLINDAMYCIN >=8 RESISTANT Resistant     RIFAMPIN <=0.5 SENSITIVE Sensitive     Inducible Clindamycin NEGATIVE Sensitive     * STAPHYLOCOCCUS EPIDERMIDIS  Blood Culture ID Panel (Reflexed)     Status: Abnormal   Collection Time: 06/12/24 10:57 PM  Result Value Ref Range Status   Enterococcus faecalis NOT DETECTED NOT DETECTED Final   Enterococcus Faecium NOT DETECTED NOT DETECTED Final   Listeria monocytogenes NOT DETECTED NOT DETECTED Final   Staphylococcus species DETECTED (A) NOT DETECTED Final    Comment: CRITICAL RESULT CALLED TO, READ BACK BY AND VERIFIED WITH: N BAUGH RN 06/14/2024 @ 0414 BY AB    Staphylococcus aureus (BCID) NOT DETECTED NOT DETECTED Final   Staphylococcus epidermidis DETECTED (A) NOT DETECTED Final    Comment: Methicillin (oxacillin) resistant coagulase negative staphylococcus. Possible blood culture contaminant (unless isolated from more than one blood culture draw or clinical case suggests pathogenicity). No antibiotic treatment is indicated for blood  culture contaminants. CRITICAL  RESULT CALLED TO, READ BACK BY AND  VERIFIED WITH: N BAUGH RN 06/14/2024 @ 0414 BY AB    Staphylococcus lugdunensis NOT DETECTED NOT DETECTED Final   Streptococcus species NOT DETECTED NOT DETECTED Final   Streptococcus agalactiae NOT DETECTED NOT DETECTED Final   Streptococcus pneumoniae NOT DETECTED NOT DETECTED Final   Streptococcus pyogenes NOT DETECTED NOT DETECTED Final   A.calcoaceticus-baumannii NOT DETECTED NOT DETECTED Final   Bacteroides fragilis NOT DETECTED NOT DETECTED Final   Enterobacterales NOT DETECTED NOT DETECTED Final   Enterobacter cloacae complex NOT DETECTED NOT DETECTED Final   Escherichia coli NOT DETECTED NOT DETECTED Final   Klebsiella aerogenes NOT DETECTED NOT DETECTED Final   Klebsiella oxytoca NOT DETECTED NOT DETECTED Final   Klebsiella pneumoniae NOT DETECTED NOT DETECTED Final   Proteus species NOT DETECTED NOT DETECTED Final   Salmonella species NOT DETECTED NOT DETECTED Final   Serratia marcescens NOT DETECTED NOT DETECTED Final   Haemophilus influenzae NOT DETECTED NOT DETECTED Final   Neisseria meningitidis NOT DETECTED NOT DETECTED Final   Pseudomonas aeruginosa NOT DETECTED NOT DETECTED Final   Stenotrophomonas maltophilia NOT DETECTED NOT DETECTED Final   Candida albicans NOT DETECTED NOT DETECTED Final   Candida auris NOT DETECTED NOT DETECTED Final   Candida glabrata NOT DETECTED NOT DETECTED Final   Candida krusei NOT DETECTED NOT DETECTED Final   Candida parapsilosis NOT DETECTED NOT DETECTED Final   Candida tropicalis NOT DETECTED NOT DETECTED Final   Cryptococcus neoformans/gattii NOT DETECTED NOT DETECTED Final   Methicillin resistance mecA/C DETECTED (A) NOT DETECTED Final    Comment: CRITICAL RESULT CALLED TO, READ BACK BY AND VERIFIED WITHBETHA LOISE MORONES RN 06/14/2024 @ 0414 BY AB Performed at [facility], [address] [place], [place] [address]   Urine Culture     Status: Abnormal   Collection Time: 06/13/24  1:20 PM    Specimen: Urine, Clean Catch  Result Value Ref Range Status   Specimen Description   Final    URINE, CLEAN CATCH Performed at [facility], [address], [place], [place] [address]    Special Requests   Final    NONE Performed at [facility], [address] [place], [place] [address]    Culture (A)  Final    <10,000 COLONIES/mL INSIGNIFICANT GROWTH Performed at [facility], [address], [place], [place] [address]    Report Status 06/14/2024 FINAL  Final  Culture, blood (Routine X 2) w Reflex to ID Panel     Status: Abnormal   Collection Time: 06/14/24  7:44 AM   Specimen: BLOOD  Result Value Ref Range Status   Specimen Description   Final    BLOOD RIGHT ANTECUBITAL Performed at [facility], [address], [place], [place] [address]    Special Requests   Final    BOTTLES DRAWN AEROBIC AND ANAEROBIC Blood Culture adequate volume Performed at [facility], [address], [place], [place] [address]    Culture  Setup Time   Final    GRAM POSITIVE COCCI IN BOTH AEROBIC AND ANAEROBIC BOTTLES Gram Stain Report Called to,Read Back By and Verified With: DSABRA MOLT ON 06/15/2024 @12 :50 BY T.HAMER Performed at [facility], [address] [place], [place] [address]    Culture (A)  Final    STAPHYLOCOCCUS EPIDERMIDIS SUSCEPTIBILITIES PERFORMED ON PREVIOUS CULTURE WITHIN THE LAST 5 DAYS. Performed at [facility], [address], [place], [place] [address]    Report Status 06/17/2024 FINAL  Final  Culture, blood (Routine  X 2) w Reflex to ID Panel     Status: Abnormal   Collection Time: 06/14/24  7:44 AM   Specimen: BLOOD  Result Value Ref Range Status   Specimen Description   Final    BLOOD LEFT ANTECUBITAL Performed at [facility], [address] [place], [place] [address]    Special Requests   Final    BOTTLES DRAWN AEROBIC AND ANAEROBIC Blood Culture adequate volume Performed at [facility], [address], [place], [place] [address]    Culture  Setup Time    Final    GRAM POSITIVE COCCI IN BOTH AEROBIC AND ANAEROBIC BOTTLES Gram Stain Report Called to,Read Back By and Verified With: B. ROBERTSON ON 06/15/2024 @13 :49 BY T.HAMER Performed at [facility], [address] [place], [place] [address]    Culture (A)  Final    STAPHYLOCOCCUS EPIDERMIDIS SUSCEPTIBILITIES PERFORMED ON PREVIOUS CULTURE WITHIN THE LAST 5 DAYS. Performed at [facility], [address] [place], [place] [address]    Report Status 06/17/2024 FINAL  Final  Culture, blood (Routine X 2) w Reflex to ID Panel     Status: None (Preliminary result)   Collection Time: 06/16/24  2:30 AM   Specimen: BLOOD LEFT ARM  Result Value Ref Range Status   Specimen Description BLOOD LEFT ARM  Final   Special Requests AEROBIC BOTTLE ONLY Blood Culture adequate volume  Final   Culture   Final    NO GROWTH 2 DAYS Performed at [facility], [address] [place], [place] [address]    Report Status PENDING  Incomplete  Culture, blood (Routine X 2) w Reflex to ID Panel     Status: None (Preliminary result)   Collection Time: 06/16/24  2:35 AM   Specimen: Right Antecubital; Blood  Result Value Ref Range Status   Specimen Description RIGHT ANTECUBITAL  Final   Special Requests   Final    BOTTLES DRAWN AEROBIC AND ANAEROBIC Blood Culture adequate volume   Culture   Final    NO GROWTH 2 DAYS Performed at [facility], [address], [place], [place] [address]    Report Status PENDING  Incomplete         Radiology Studies: ECHO TEE Result Date: 06/18/2024    TRANSESOPHOGEAL ECHO REPORT   Patient Name:   DERRIUS FURTICK Date of Exam: 06/18/2024 Medical Rec #:  [id number]  Height:       62.0 in Accession #:    [id number] Weight:       153.2 lb Date of Birth:  10/06/1946   BSA:          1.707 m Patient Age:    77 years   BP:           118/60 mmHg Patient Gender: M          HR:           94 bpm. Exam Location:  Inpatient Procedure: 3D Echo, Transesophageal Echo, Cardiac Doppler and Color Doppler             (Both Spectral and Color Flow Doppler were utilized during            procedure). Indications:     Endocarditis  History:         Patient has prior history of Echocardiogram examinations, most                  recent 06/13/2024. AVR Inspiris Resilia prosthesis 11/02/23.  Aortic Valve: unknown Inspiris Resilia valve is present in the                  aortic position. Procedure Date: 11/02/23.  Sonographer:     Tinnie Gosling RDCS Referring Phys:  [id number] LORETTE CINDERELLA KAPUR Diagnosing Phys: Oneil Parchment MD PROCEDURE: TEE procedure time was 30 minutes. The transesophogeal probe was passed without difficulty through the esophogus of the patient. Imaged were obtained with the patient in a left lateral decubitus position. Sedation performed by different physician. Image quality was good. The patient's vital signs; including heart rate, blood pressure, and oxygen saturation; remained stable throughout the procedure. The patient developed no complications during the procedure.  IMPRESSIONS  1. Left ventricular ejection fraction, by estimation, is 65 to 70%. The left ventricle has normal function. The left ventricle has no regional wall motion abnormalities.  2. Right ventricular systolic function is normal. The right ventricular size is normal.  3. No left atrial/left atrial appendage thrombus was detected.  4. The mitral valve is normal in structure. No evidence of mitral valve regurgitation. No evidence of mitral stenosis.  5. 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  6. Mobile mass measuring 1.15cm x 1.29cm with a 1.66 cm mobile tail - bioprosthetic valve endocarditis. The aortic valve has been repaired/replaced. Aortic valve regurgitation is trivial. No aortic stenosis is present. There is a unknown Inspiris Resilia valve present in the aortic position. Procedure Date: 11/02/23.  Aortic valve mean gradient measures 20.0 mmHg. Aortic valve Vmax measures 2.90 m/s.  7. The inferior vena cava is normal in size with greater than 50% respiratory variability, suggesting right atrial pressure of 3 mmHg.  8. 3D performed of the aortic valve and demonstrates AV vegetation. Conclusion(s)/Recommendation(s): Findings are concerning for vegetation/infective endocarditis as detailed above. Discussed with consultation team. FINDINGS  Left Ventricle: Left ventricular ejection fraction, by estimation, is 65 to 70%. The left ventricle has normal function. The left ventricle has no regional wall motion abnormalities. The left ventricular internal cavity size was normal in size. There is  no left ventricular hypertrophy. Right Ventricle: The right ventricular size is normal. No increase in right ventricular wall thickness. Right ventricular systolic function is normal. Left Atrium: Left atrial size was normal in size. No left atrial/left atrial appendage thrombus was detected. Right Atrium: Right atrial size was normal in size. Pericardium: There is no evidence of pericardial effusion. Mitral Valve: The mitral valve is normal in structure. Mild mitral annular calcification. No evidence of mitral valve regurgitation. No evidence of mitral valve stenosis. 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 No evidence of tricuspid stenosis. Aortic Valve: Mobile mass measuring 1.15cm x 1.29cm with a 1.66 cm mobile tail - bioprosthetic valve endocarditis. The aortic valve has been repaired/replaced. Aortic valve regurgitation is trivial. No aortic stenosis is present. Aortic valve mean gradient measures 20.0 mmHg. Aortic valve peak gradient measures 33.6 mmHg. There is a unknown Inspiris Resilia valve present in the aortic position. Procedure Date: 11/02/23. Pulmonic Valve: The pulmonic valve  was normal in structure. Pulmonic valve regurgitation is not visualized. No evidence of pulmonic stenosis. Aorta: The aortic root is normal in size and structure. Venous: The inferior vena cava is normal in size with greater than 50% respiratory variability, suggesting right atrial pressure of 3 mmHg.  IAS/Shunts: No atrial level shunt detected by color flow Doppler. Additional Comments: 3D was performed not requiring image post processing on an independent workstation and was abnormal. LEFT VENTRICLE PLAX 2D LVOT diam:     2.00 cm LVOT Area:     3.14 cm  AORTIC VALVE AV Vmax:      290.00 cm/s AV Vmean:     202.000 cm/s AV VTI:       0.497 m AV Peak Grad: 33.6 mmHg AV Mean Grad: 20.0 mmHg  AORTA Ao Root diam: 3.60 cm Ao Asc diam:  3.20 cm  SHUNTS Systemic Diam: 2.00 cm Oneil Parchment MD Electronically signed by Oneil Parchment MD Signature Date/Time: 06/18/2024/10:17:10 AM    Final    EP STUDY Result Date: 06/18/2024 See surgical note for result.   Scheduled Meds:  aspirin   81 mg Oral Daily   finasteride   5 mg Oral QPM   pantoprazole  (PROTONIX ) IV  40 mg Intravenous Q24H   rosuvastatin   40 mg Oral Daily   Continuous Infusions:  vancomycin  1,000 mg (06/18/24 1224)     LOS: 3 days   Almarie KANDICE Hoots, MD  06/18/2024, 1:29 PM

## 2024-06-18 NOTE — Progress Notes (Signed)
 Rounding Note   Patient Name: Kyle Matthews Date of Encounter: 06/18/2024  [facility] Cardiologist: Lurena MARLA Red, MD   Subjective BP 110/60, creatinine 0.6, hemoglobin 10.1. Denies any chest pain or dyspnea  Scheduled Meds:  [MAR Hold] aspirin   81 mg Oral Daily   [MAR Hold] finasteride   5 mg Oral QPM   [MAR Hold] pantoprazole  (PROTONIX ) IV  40 mg Intravenous Q24H   [MAR Hold] rosuvastatin   40 mg Oral Daily   Continuous Infusions:  [MAR Hold] vancomycin  Stopped (06/18/24 0034)   PRN Meds: [MAR Hold] acetaminophen  **OR** [MAR Hold] acetaminophen , [MAR Hold] furosemide , [MAR Hold] ondansetron  **OR** [MAR Hold] ondansetron  (ZOFRAN ) IV, [MAR Hold] polyethylene glycol   Vital Signs  Vitals:   06/18/24 0850 06/18/24 0855 06/18/24 0900 06/18/24 0905  BP: (!) 87/64 109/65 104/60 110/60  Pulse: 85 81 79 78  Resp: 17 (!) 21 17 15   Temp:      TempSrc:      SpO2: 92% 94% 94% 95%  Weight:      Height:        Intake/Output Summary (Last 24 hours) at 06/18/2024 0912 Last data filed at 06/18/2024 0819 Gross per 24 hour  Intake 687 ml  Output 201 ml  Net 486 ml      06/12/2024   11:30 PM 06/12/2024    4:57 PM 05/22/2024   11:07 AM  Last 3 Weights  Weight (lbs) 153 lb 3.5 oz 156 lb 8.4 oz 156 lb 8.4 oz  Weight (kg) 69.5 kg 71 kg 71 kg      Telemetry NSR - Personally Reviewed  ECG  No new ECG - Personally Reviewed  Physical Exam  GEN: No acute distress.   Neck: No JVD Cardiac: RRR, no murmurs, rubs, or gallops.  Respiratory: Clear to auscultation bilaterally. GI: Soft, nontender, non-distended  MS: No edema; No deformity. Neuro:  Nonfocal  Psych: Normal affect   Labs High Sensitivity Troponin:   Recent Labs  Lab 06/12/24 1819 06/12/24 1928  TROPONINIHS 99* 86*     Chemistry Recent Labs  Lab 06/12/24 1819 06/13/24 0454 06/13/24 9062 06/14/24 0426 06/15/24 0306 06/17/24 0312 06/18/24 0441  NA 134*   < >  --  132* 134* 134* 134*  K 3.7   < >  --   3.8 3.8 3.4* 3.3*  CL 101   < >  --  99 100 102 102  CO2 23   < >  --  24 26 22 24   GLUCOSE 121*   < >  --  114* 92 92 91  BUN 15   < >  --  11 15 9 9   CREATININE 0.70   < >  --  0.75 0.76 0.59* 0.62  CALCIUM  8.7*   < >  --  8.4* 8.3* 8.2* 8.3*  MG  --   --  1.9 1.9 2.0  --   --   PROT 6.5  --   --   --   --   --   --   ALBUMIN  3.1*  --   --   --   --   --   --   AST 19  --   --   --   --   --   --   ALT 12  --   --   --   --   --   --   ALKPHOS 64  --   --   --   --   --   --  BILITOT 0.8  --   --   --   --   --   --   GFRNONAA >60   < >  --  >60 >60 >60 >60  ANIONGAP 10   < >  --  9 8 10 8    < > = values in this interval not displayed.    Lipids  Recent Labs  Lab 06/14/24 0426  CHOL 124  TRIG 91  HDL 40*  LDLCALC 66  CHOLHDL 3.1    Hematology Recent Labs  Lab 06/15/24 0306 06/16/24 0230 06/17/24 0312  WBC 11.3* 14.0* 11.5*  RBC 3.31* 3.81* 3.31*  HGB 9.7* 11.3* 10.1*  HCT 30.2* 35.0* 29.8*  MCV 91.2 91.9 90.0  MCH 29.3 29.7 30.5  MCHC 32.1 32.3 33.9  RDW 13.9 13.6 13.6  PLT 188 229 204   Thyroid   Recent Labs  Lab 06/12/24 1928  TSH 0.937    BNP Recent Labs  Lab 06/12/24 1819  BNP 169.0*    DDimer  Recent Labs  Lab 06/12/24 1819  DDIMER >20.00*     Radiology  EP STUDY Result Date: 06/18/2024 See surgical note for result.   Cardiac Studies   Patient Profile   78 y.o. male with a history of CAD status post CABG, aortic stenosis status post bioprosthetic AVR in 10/2023, CVA, hypertension, hyperlipidemia who we are consulted for evaluation of infective endocarditis.  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  Blood cultures grew MRSE.  TTE showed increased gradients through bioprosthetic aortic valve.  He was started on IV vancomycin  and transferred to [facility] for TEE.  TEE today showed mobile valvular vegetation measuring 1.5 cm on bioprosthetic aortic valve, mean gradient 20 mmHg  through valve, trivial AI.  Also with 1 cm vegetation on tricuspid valve with moderate TR. -Consult CT surgery -Consult ID  Chest pain/troponin elevation: Troponin is mildly elevated and flat.  Echo with normal EF.  Suspect demand ischemia in setting of acute illness.  Acute CVA: MRI brain showed small acute infarct in left occipital lobe.  Likely due to prosthetic aortic valve endocarditis as above.  Neurology following   For questions or updates, please contact [facility] Please consult [email or web address] for contact info under     Signed, [name], MD  06/18/2024, 9:12 AM

## 2024-06-19 DIAGNOSIS — I330 Acute and subacute infective endocarditis: Secondary | ICD-10-CM | POA: Diagnosis not present

## 2024-06-19 DIAGNOSIS — T826XXA Infection and inflammatory reaction due to cardiac valve prosthesis, initial encounter: Secondary | ICD-10-CM | POA: Diagnosis not present

## 2024-06-19 DIAGNOSIS — Z952 Presence of prosthetic heart valve: Secondary | ICD-10-CM | POA: Diagnosis not present

## 2024-06-19 DIAGNOSIS — I639 Cerebral infarction, unspecified: Secondary | ICD-10-CM | POA: Diagnosis not present

## 2024-06-19 DIAGNOSIS — R7881 Bacteremia: Secondary | ICD-10-CM | POA: Diagnosis not present

## 2024-06-19 LAB — BASIC METABOLIC PANEL WITH GFR
Anion gap: 7 (ref 5–15)
BUN: 10 mg/dL (ref 8–23)
CO2: 24 mmol/L (ref 22–32)
Calcium: 8.3 mg/dL — ABNORMAL LOW (ref 8.9–10.3)
Chloride: 103 mmol/L (ref 98–111)
Creatinine, Ser: 0.65 mg/dL (ref 0.61–1.24)
GFR, Estimated: >60 mL/min (ref >60)
Glucose, Bld: 113 mg/dL — ABNORMAL HIGH (ref 70–99)
Potassium: 3.5 mmol/L (ref 3.5–5.1)
Sodium: 134 mmol/L — ABNORMAL LOW (ref 135–145)

## 2024-06-19 LAB — VANCOMYCIN, TROUGH: Vancomycin Tr: 12 ug/mL — ABNORMAL LOW (ref 15–20)

## 2024-06-19 MED ORDER — VANCOMYCIN HCL 1500 MG/300ML IV SOLN
1500.0000 mg | Freq: Two times a day (BID) | INTRAVENOUS | Status: DC
  Administered 2024-06-19 – 2024-06-22 (×6): 1500 mg via INTRAVENOUS
  Filled 2024-06-19 (×6): qty 300

## 2024-06-19 MED ORDER — ASPIRIN 81 MG PO CHEW
81.0000 mg | CHEWABLE_TABLET | ORAL | Status: AC
  Administered 2024-06-20: 81 mg via ORAL
  Filled 2024-06-19: qty 1

## 2024-06-19 MED ORDER — ASPIRIN 81 MG PO CHEW
81.0000 mg | CHEWABLE_TABLET | Freq: Every day | ORAL | Status: DC
  Administered 2024-06-21 – 2024-06-25 (×5): 81 mg via ORAL
  Filled 2024-06-19 (×5): qty 1

## 2024-06-19 MED ORDER — SODIUM CHLORIDE 0.9 % WEIGHT BASED INFUSION
3.0000 mL/kg/h | INTRAVENOUS | Status: DC
  Administered 2024-06-20: 3 mL/kg/h via INTRAVENOUS

## 2024-06-19 MED ORDER — SODIUM CHLORIDE 0.9 % WEIGHT BASED INFUSION: 1.0000 mL/kg/h | INTRAVENOUS | Status: DC

## 2024-06-19 MED ORDER — DIPHENHYDRAMINE-ZINC ACETATE 2-0.1 % EX CREA
TOPICAL_CREAM | Freq: Two times a day (BID) | CUTANEOUS | Status: DC | PRN
  Filled 2024-06-19: qty 28

## 2024-06-19 NOTE — Progress Notes (Addendum)
  Progress Note  Patient Name: Kyle Matthews Date of Encounter: 06/19/2024 [facility] Cardiologist: Arun K Thukkani, MD   Interval Summary   Patient resting comfortably with wife present   Vital Signs Vitals:   06/18/24 1139 06/18/24 1620 06/18/24 2022 06/19/24 0721  BP: 124/69 106/61  134/70  Pulse: 79 85  75  Resp: 18 18 18 20   Temp: 98.7 F (37.1 C) 98.4 F (36.9 C) 98.3 F (36.8 C) 98 F (36.7 C)  TempSrc: Oral Oral Oral Oral  SpO2: 97% 97%  94%  Weight:      Height:       Intake/Output Summary (Last 24 hours) at 06/19/2024 9062 Last data filed at 06/18/2024 1819 Gross per 24 hour  Intake 1520.96 ml  Output --  Net 1520.96 ml      06/12/2024   11:30 PM 06/12/2024    4:57 PM 05/22/2024   11:07 AM  Last 3 Weights  Weight (lbs) 153 lb 3.5 oz 156 lb 8.4 oz 156 lb 8.4 oz  Weight (kg) 69.5 kg 71 kg 71 kg     Telemetry/ECG  Sinus rhythm, HR 70-80s - Personally Reviewed  Physical Exam  GEN: No acute distress.   Neck: No JVD Cardiac: RRR, SEM Respiratory: Clear to auscultation bilaterally. GI: Soft, nontender, non-distended  MS: No edema  Assessment & Plan  78 y.o. male with a history of CAD status post CABG, aortic stenosis status post bioprosthetic AVR in 10/2023, CVA, hypertension, hyperlipidemia who we are consulted for evaluation of infective endocarditis   Prosthetic valve endocarditis  Presented with chills and rigors  Blood cultures showed methicillin resistant Staphylococcus epidermidis  TTE showed increased gradients thru bioprosthetic aortic valve  Started on IV antibiotics and transferred to [facility] TEE showed mobile valvular vegetation at 1.5 cm, mean gradient 20 mmHg, trivial AI, also showed 1 cm vegetation on tricuspid valve with moderate TR CT surgery and ID consulted  CT surgery discussed plans for surgical intervention, patient would like to proceed with surgery  Informed Consent   Shared Decision Making/Informed Consent The risks [stroke (1  in 1000), death (1 in 1000), kidney failure [usually temporary] (1 in 500), bleeding (1 in 200), allergic reaction [possibly serious] (1 in 200)], benefits (diagnostic support and management of coronary artery disease) and alternatives of a cardiac catheterization were discussed in detail with Kyle Matthews and he is willing to proceed.     Chest pain  Elevated troponin level  Troponin 99 > 86 Echo showed normal EF  Suspect likely due to demand ischemia   Acute CVA  Brain MRI showed small acute infarct in left occipital lobe  Suspect its due to prosthetic aortic valve endocarditis  Followed by neurology    For questions or updates, please contact [facility] Please consult [email or web address] for contact info under       Signed, [name], PA-C   Patient seen and examined.  Agree with above documentation.  On exam, patient is alert and oriented, regular rate and rhythm, 2/6 systolic murmur, lungs CTAB, no LE edema or JVD.  He is planning on proceeding with redo AVR with Dr. Lucas.  Will need preop LHC/RHC, will schedule for tomorrow.  [name], MD

## 2024-06-19 NOTE — Progress Notes (Signed)
 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  Blood cultures positive for MRSE, MRI brain showed concerns of possible embolic stroke.  Echocardiogram negative for endocarditis but TEE showed large mobile density on tricuspid and prosthetic valve.  CT surgery and cardiology team consulted   Assessment & Plan:  Principal Problem:   Endocarditis of prosthetic aortic valve Active Problems:   Chronic heart failure with preserved ejection fraction (HFpEF) (HCC)   BPH with obstruction/lower urinary tract symptoms   Essential hypertension   Chest pain   S/P CABG x 2   S/P AVR (aortic valve replacement)   SIRS (systemic inflammatory response syndrome) (HCC)   Bacteremia   Acute embolic stroke (HCC)   Endocarditis of tricuspid valve   Congestive heart failure (HCC)   Prosthetic valve endocarditis   Acute stroke due to ischemia (HCC)   MRSE bacteremia 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  TEE shows concerns of endocarditis.  CT surgery, ID and cardiology team is following.  MRI is also positive for embolic CVA -Initial blood culture 6/27-positive for MRSE -Repeat blood cultures 6/29-NGTD - Preop cardiac cath plan - Eventually patient will require definitive operative management for his valvular endocarditis.  Acute CVA, likely embolic - MRI brain is positive for acute CVA.  Echocardiogram shows EF 50%, moderate TR.  LDL 66, A1c 5.1.  Neurology recommended aspirin .  History of BPH - Status post laparoscopic prostatectomy on 6/4,  Foley catheter was removed on 6/16.  Currently able to urinate.  Continue finasteride   Hyperlipidemia - Statin  Essential hypertension - IV as needed   DVT prophylaxis:     Code Status: Full Code Family Communication:   Status is: Inpatient Ongoing management for endocarditis.    Subjective:  No complaints.  All questions answered by me Agreeable for [facility] tomorrow  Examination:  General exam: Appears calm and comfortable  Respiratory system: Clear to auscultation. Respiratory effort normal. Cardiovascular system: S1 & S2 heard, RRR. No JVD, murmurs, rubs, gallops or clicks. No pedal edema. Gastrointestinal system: Abdomen is nondistended, soft and nontender. No organomegaly or masses felt. Normal bowel sounds heard. Central nervous system: Alert and oriented. No focal neurological deficits. Extremities: Symmetric 5 x 5 power. Skin: No rashes, lesions or ulcers Psychiatry: Judgement and insight appear normal. Mood & affect appropriate.                Diet Orders (From admission, onward)     Start     Ordered   06/20/24 0001  Diet NPO time specified Except for: Sips with Meds  Diet effective midnight       Comments: NPO for solid foods after midnight, may have clear liquids until 5am, then NPO (this would be for inpatients and outpatients)  Question:  Except for  Answer:  Sips with Meds   06/19/24 1017   06/18/24 0922  Diet Heart Room service appropriate? Yes; Fluid consistency: Thin  Diet effective now       Question Answer Comment  Room service appropriate? Yes   Fluid consistency: Thin  06/18/24 0921            Objective: Vitals:   06/18/24 1620 06/18/24 2022 06/19/24 0721 06/19/24 1209  BP: 106/61  134/70 126/65  Pulse: 85  75 83  Resp: 18 18 20 18   Temp: 98.4 F (36.9 C) 98.3 F (36.8 C) 98 F (36.7 C) 98.2 F (36.8 C)  TempSrc: Oral Oral Oral Oral  SpO2: 97%  94% 100%  Weight:      Height:        Intake/Output Summary (Last 24  hours) at 06/19/2024 1338 Last data filed at 06/19/2024 1207 Gross per 24 hour  Intake 2083 ml  Output --  Net 2083 ml   Filed Weights   06/12/24 1657 06/12/24 2330  Weight: 71 kg 69.5 kg    Scheduled Meds:  aspirin   81 mg Oral Daily   finasteride   5 mg Oral QPM   pantoprazole  (PROTONIX ) IV  40 mg Intravenous Q24H   rosuvastatin   40 mg Oral Daily   Continuous Infusions:  [START ON 06/20/2024] sodium chloride      Followed by   NOREEN ON 06/20/2024] sodium chloride      vancomycin       Nutritional status     Body mass index is 28.02 kg/m.  Data Reviewed:   CBC: Recent Labs  Lab 06/12/24 1819 06/13/24 0454 06/14/24 0426 06/15/24 0306 06/16/24 0230 06/17/24 0312  WBC 14.0* 14.0* 14.2* 11.3* 14.0* 11.5*  NEUTROABS 11.8*  --   --   --   --   --   HGB 10.8* 10.8* 10.5* 9.7* 11.3* 10.1*  HCT 32.8* 32.5* 31.0* 30.2* 35.0* 29.8*  MCV 89.1 89.0 90.9 91.2 91.9 90.0  PLT 176 177 207 188 229 204   Basic Metabolic Panel: Recent Labs  Lab 06/13/24 0937 06/14/24 0426 06/15/24 0306 06/17/24 0312 06/18/24 0441 06/19/24 0429  NA  --  132* 134* 134* 134* 134*  K  --  3.8 3.8 3.4* 3.3* 3.5  CL  --  99 100 102 102 103  CO2  --  24 26 22 24 24   GLUCOSE  --  114* 92 92 91 113*  BUN  --  11 15 9 9 10   CREATININE  --  0.75 0.76 0.59* 0.62 0.65  CALCIUM   --  8.4* 8.3* 8.2* 8.3* 8.3*  MG 1.9 1.9 2.0  --   --   --    GFR: Estimated Creatinine Clearance: 68.4 mL/min (by C-G formula based on SCr of 0.65 mg/dL). Liver Function Tests: Recent Labs  Lab 06/12/24 1819  AST 19  ALT 12  ALKPHOS 64  BILITOT 0.8  PROT 6.5  ALBUMIN  3.1*   No results for input(s): LIPASE, AMYLASE in the last 168 hours. No results for input(s): AMMONIA in the last 168 hours. Coagulation Profile: Recent Labs  Lab 06/12/24 1819  INR 1.1   Cardiac Enzymes: Recent Labs  Lab 06/13/24 0937  CKTOTAL 12*   BNP (last 3 results) No results for input(s): PROBNP in the last 8760  hours. HbA1C: No results for input(s): HGBA1C in the last 72 hours. CBG: Recent Labs  Lab 06/18/24 2129  GLUCAP 122*   Lipid Profile: No results for input(s): CHOL, HDL, LDLCALC, TRIG, CHOLHDL, LDLDIRECT in the last 72 hours. Thyroid  Function Tests: No results for input(s): TSH, T4TOTAL, FREET4, T3FREE, THYROIDAB in the last 72 hours. Anemia Panel: No results for input(s): VITAMINB12, FOLATE, FERRITIN, TIBC, IRON , RETICCTPCT in the last 72 hours. Sepsis Labs: Recent Labs  Lab  06/13/24 0937  PROCALCITON <0.10  LATICACIDVEN 1.1    Recent Results (from the past 240 hours)  Culture, blood (Routine X 2) w Reflex to ID Panel     Status: Abnormal   Collection Time: 06/12/24 10:55 PM   Specimen: BLOOD  Result Value Ref Range Status   Specimen Description   Final    BLOOD BLOOD LEFT ARM Performed at [facility], [address], [place], [place] [address]    Special Requests   Final    BOTTLES DRAWN AEROBIC AND ANAEROBIC Blood Culture adequate volume Performed at [facility], [address], [place], [place] [address]    Culture  Setup Time   Final    GRAM POSITIVE COCCI IN BOTH AEROBIC AND ANAEROBIC BOTTLES CRITICAL VALUE NOTED.  VALUE IS CONSISTENT WITH PREVIOUSLY REPORTED AND CALLED VALUE. JESSICA ELLER AT 2315 06/13/24 BY A. SNYDER GRAM STAIN REVIEWED-AGREE WITH RESULT    Culture (A)  Final    STAPHYLOCOCCUS EPIDERMIDIS SUSCEPTIBILITIES PERFORMED ON PREVIOUS CULTURE WITHIN THE LAST 5 DAYS. Performed at [facility], [address], [place], [place] [address]    Report Status 06/17/2024 FINAL  Final  Culture, blood (Routine X 2) w Reflex to ID Panel     Status: Abnormal   Collection Time: 06/12/24 10:57 PM   Specimen: BLOOD LEFT HAND  Result Value Ref Range Status   Specimen Description   Final    BLOOD LEFT HAND Performed at [facility], [address], [place], [place] [address]    Special Requests   Final    BOTTLES  DRAWN AEROBIC AND ANAEROBIC Blood Culture adequate volume Performed at [facility], [address] [place], [place] [address]    Culture  Setup Time   Final    GRAM POSITIVE COCCI IN BOTH AEROBIC AND ANAEROBIC BOTTLES Gram Stain Report Called to,Read Back By and Verified With: JESSICA ELLER,RN AT 2315 06/13/24 BY A. SNYDER CRITICAL RESULT CALLED TO, READ BACK BY AND VERIFIED WITH: LOISE MORONES RN 06/14/2024 @ 0414 BY AB GRAM STAIN REVIEWED-AGREE WITH RESULT Performed at [facility], [address] [place], [place] [address]    Culture STAPHYLOCOCCUS EPIDERMIDIS (A)  Final   Report Status 06/17/2024 FINAL  Final   Organism ID, Bacteria STAPHYLOCOCCUS EPIDERMIDIS  Final      Susceptibility   Staphylococcus epidermidis - MIC*    CIPROFLOXACIN  >=8 RESISTANT Resistant     ERYTHROMYCIN >=8 RESISTANT Resistant     GENTAMICIN <=0.5 SENSITIVE Sensitive     OXACILLIN >=4 RESISTANT Resistant     TETRACYCLINE >=16 RESISTANT Resistant     VANCOMYCIN  <=0.5 SENSITIVE Sensitive     TRIMETH/SULFA 160 RESISTANT Resistant     CLINDAMYCIN >=8 RESISTANT Resistant     RIFAMPIN <=0.5 SENSITIVE Sensitive     Inducible Clindamycin NEGATIVE Sensitive     * STAPHYLOCOCCUS EPIDERMIDIS  Blood Culture ID Panel (Reflexed)     Status: Abnormal   Collection Time: 06/12/24 10:57 PM  Result Value Ref Range Status   Enterococcus faecalis NOT DETECTED NOT DETECTED Final   Enterococcus Faecium NOT DETECTED NOT DETECTED Final   Listeria monocytogenes NOT DETECTED NOT DETECTED Final   Staphylococcus species DETECTED (A) NOT DETECTED Final    Comment: CRITICAL RESULT CALLED TO, READ BACK BY AND VERIFIED WITH: N BAUGH RN 06/14/2024 @ 0414 BY AB    Staphylococcus aureus (BCID) NOT DETECTED NOT DETECTED Final   Staphylococcus epidermidis DETECTED (A) NOT DETECTED Final  Comment: Methicillin (oxacillin) resistant coagulase negative staphylococcus. Possible blood culture contaminant (unless isolated from more than one  blood culture draw or clinical case suggests pathogenicity). No antibiotic treatment is indicated for blood  culture contaminants. CRITICAL RESULT CALLED TO, READ BACK BY AND VERIFIED WITH: N BAUGH RN 06/14/2024 @ 0414 BY AB    Staphylococcus lugdunensis NOT DETECTED NOT DETECTED Final   Streptococcus species NOT DETECTED NOT DETECTED Final   Streptococcus agalactiae NOT DETECTED NOT DETECTED Final   Streptococcus pneumoniae NOT DETECTED NOT DETECTED Final   Streptococcus pyogenes NOT DETECTED NOT DETECTED Final   A.calcoaceticus-baumannii NOT DETECTED NOT DETECTED Final   Bacteroides fragilis NOT DETECTED NOT DETECTED Final   Enterobacterales NOT DETECTED NOT DETECTED Final   Enterobacter cloacae complex NOT DETECTED NOT DETECTED Final   Escherichia coli NOT DETECTED NOT DETECTED Final   Klebsiella aerogenes NOT DETECTED NOT DETECTED Final   Klebsiella oxytoca NOT DETECTED NOT DETECTED Final   Klebsiella pneumoniae NOT DETECTED NOT DETECTED Final   Proteus species NOT DETECTED NOT DETECTED Final   Salmonella species NOT DETECTED NOT DETECTED Final   Serratia marcescens NOT DETECTED NOT DETECTED Final   Haemophilus influenzae NOT DETECTED NOT DETECTED Final   Neisseria meningitidis NOT DETECTED NOT DETECTED Final   Pseudomonas aeruginosa NOT DETECTED NOT DETECTED Final   Stenotrophomonas maltophilia NOT DETECTED NOT DETECTED Final   Candida albicans NOT DETECTED NOT DETECTED Final   Candida auris NOT DETECTED NOT DETECTED Final   Candida glabrata NOT DETECTED NOT DETECTED Final   Candida krusei NOT DETECTED NOT DETECTED Final   Candida parapsilosis NOT DETECTED NOT DETECTED Final   Candida tropicalis NOT DETECTED NOT DETECTED Final   Cryptococcus neoformans/gattii NOT DETECTED NOT DETECTED Final   Methicillin resistance mecA/C DETECTED (A) NOT DETECTED Final    Comment: CRITICAL RESULT CALLED TO, READ BACK BY AND VERIFIED WITHBETHA LOISE MORONES RN 06/14/2024 @ 0414 BY AB Performed at [facility], [address], [place], [place] [address]   Urine Culture     Status: Abnormal   Collection Time: 06/13/24  1:20 PM   Specimen: Urine, Clean Catch  Result Value Ref Range Status   Specimen Description   Final    URINE, CLEAN CATCH Performed at [facility], [address], [place], [place] [address]    Special Requests   Final    NONE Performed at [facility], [address] [place], [place] [address]    Culture (A)  Final    <10,000 COLONIES/mL INSIGNIFICANT GROWTH Performed at [facility], [address], [place], [place] [address]    Report Status 06/14/2024 FINAL  Final  Culture, blood (Routine X 2) w Reflex to ID Panel     Status: Abnormal   Collection Time: 06/14/24  7:44 AM   Specimen: BLOOD  Result Value Ref Range Status   Specimen Description   Final    BLOOD RIGHT ANTECUBITAL Performed at [facility], [address] [place], [place] [address]    Special Requests   Final    BOTTLES DRAWN AEROBIC AND ANAEROBIC Blood Culture adequate volume Performed at [facility], [address], [place], [place] [address]    Culture  Setup Time   Final    GRAM POSITIVE COCCI IN BOTH AEROBIC AND ANAEROBIC BOTTLES Gram Stain Report Called to,Read Back By and Verified With: DSABRA MOLT ON 06/15/2024 @12 :50 BY T.HAMER Performed at [facility], [address], [place], [place] [address]    Culture (  A)  Final    STAPHYLOCOCCUS EPIDERMIDIS SUSCEPTIBILITIES PERFORMED ON PREVIOUS CULTURE WITHIN THE LAST 5 DAYS. Performed at [facility], [address], [place], [place] [address]    Report Status 06/17/2024 FINAL  Final  Culture, blood (Routine X 2) w Reflex to ID Panel     Status: Abnormal   Collection Time: 06/14/24  7:44 AM   Specimen: BLOOD  Result Value Ref Range Status   Specimen Description   Final    BLOOD LEFT ANTECUBITAL Performed at [facility], [address] [place], [place] [address]    Special Requests   Final    BOTTLES DRAWN AEROBIC AND  ANAEROBIC Blood Culture adequate volume Performed at [facility], [address], [place], [place] [address]    Culture  Setup Time   Final    GRAM POSITIVE COCCI IN BOTH AEROBIC AND ANAEROBIC BOTTLES Gram Stain Report Called to,Read Back By and Verified With: B. ROBERTSON ON 06/15/2024 @13 :49 BY T.HAMER Performed at [facility], [address] [place], [place] [address]    Culture (A)  Final    STAPHYLOCOCCUS EPIDERMIDIS SUSCEPTIBILITIES PERFORMED ON PREVIOUS CULTURE WITHIN THE LAST 5 DAYS. Performed at [facility], [address], [place], [place] [address]    Report Status 06/17/2024 FINAL  Final  Culture, blood (Routine X 2) w Reflex to ID Panel     Status: None (Preliminary result)   Collection Time: 06/16/24  2:30 AM   Specimen: BLOOD LEFT ARM  Result Value Ref Range Status   Specimen Description BLOOD LEFT ARM  Final   Special Requests AEROBIC BOTTLE ONLY Blood Culture adequate volume  Final   Culture   Final    NO GROWTH 3 DAYS Performed at [facility], [address] [place], [place] [address]    Report Status PENDING  Incomplete  Culture, blood (Routine X 2) w Reflex to ID Panel     Status: None (Preliminary result)   Collection Time: 06/16/24  2:35 AM   Specimen: Right Antecubital; Blood  Result Value Ref Range Status   Specimen Description RIGHT ANTECUBITAL  Final   Special Requests   Final    BOTTLES DRAWN AEROBIC AND ANAEROBIC Blood Culture adequate volume   Culture   Final    NO GROWTH 3 DAYS Performed at [facility], [address] [place], [place] [address]    Report Status PENDING  Incomplete         Radiology Studies: ECHO TEE Result Date: 06/18/2024    TRANSESOPHOGEAL ECHO REPORT   Patient Name:   Kyle Matthews Date of Exam: 06/18/2024 Medical Rec #:  [id number]  Height:       62.0 in Accession #:    [id number] Weight:       153.2 lb Date of Birth:  07-20-1946   BSA:          1.707 m Patient Age:    77 years   BP:           118/60 mmHg Patient  Gender: M          HR:           94 bpm. Exam Location:  Inpatient Procedure: 3D Echo, Transesophageal Echo, Cardiac Doppler and Color Doppler            (Both Spectral and Color Flow Doppler were utilized during            procedure). Indications:     Endocarditis  History:  Patient has prior history of Echocardiogram examinations, most                  recent 06/13/2024. AVR Inspiris Resilia prosthesis 11/02/23.                  Aortic Valve: unknown Inspiris Resilia valve is present in the                  aortic position. Procedure Date: 11/02/23.  Sonographer:     Tinnie Gosling RDCS Referring Phys:  [id number] LORETTE CINDERELLA KAPUR Diagnosing Phys: Oneil Parchment MD PROCEDURE: TEE procedure time was 30 minutes. The transesophogeal probe was passed without difficulty through the esophogus of the patient. Imaged were obtained with the patient in a left lateral decubitus position. Sedation performed by different physician. Image quality was good. The patient's vital signs; including heart rate, blood pressure, and oxygen saturation; remained stable throughout the procedure. The patient developed no complications during the procedure.  IMPRESSIONS  1. Left ventricular ejection fraction, by estimation, is 65 to 70%. The left ventricle has normal function. The left ventricle has no regional wall motion abnormalities.  2. Right ventricular systolic function is normal. The right ventricular size is normal.  3. No left atrial/left atrial appendage thrombus was detected.  4. The mitral valve is normal in structure. No evidence of mitral valve regurgitation. No evidence of mitral stenosis.  5. 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  6. Mobile mass measuring 1.15cm x 1.29cm with a 1.66 cm mobile tail - bioprosthetic valve endocarditis. The aortic valve has been repaired/replaced. Aortic valve regurgitation is  trivial. No aortic stenosis is present. There is a unknown Inspiris Resilia valve present in the aortic position. Procedure Date: 11/02/23. Aortic valve mean gradient measures 20.0 mmHg. Aortic valve Vmax measures 2.90 m/s.  7. The inferior vena cava is normal in size with greater than 50% respiratory variability, suggesting right atrial pressure of 3 mmHg.  8. 3D performed of the aortic valve and demonstrates AV vegetation. Conclusion(s)/Recommendation(s): Findings are concerning for vegetation/infective endocarditis as detailed above. Discussed with consultation team. FINDINGS  Left Ventricle: Left ventricular ejection fraction, by estimation, is 65 to 70%. The left ventricle has normal function. The left ventricle has no regional wall motion abnormalities. The left ventricular internal cavity size was normal in size. There is  no left ventricular hypertrophy. Right Ventricle: The right ventricular size is normal. No increase in right ventricular wall thickness. Right ventricular systolic function is normal. Left Atrium: Left atrial size was normal in size. No left atrial/left atrial appendage thrombus was detected. Right Atrium: Right atrial size was normal in size. Pericardium: There is no evidence of pericardial effusion. Mitral Valve: The mitral valve is normal in structure. Mild mitral annular calcification. No evidence of mitral valve regurgitation. No evidence of mitral valve stenosis. 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 No evidence of tricuspid stenosis. Aortic Valve: Mobile mass measuring 1.15cm x 1.29cm with a 1.66 cm mobile tail - bioprosthetic valve endocarditis. The aortic valve has been repaired/replaced. Aortic valve regurgitation is trivial. No aortic stenosis is present. Aortic valve mean gradient measures 20.0 mmHg. Aortic valve peak gradient measures 33.6  mmHg. There is a unknown Inspiris Resilia valve present in the aortic position. Procedure Date: 11/02/23. Pulmonic Valve: The  pulmonic valve was normal in structure. Pulmonic valve regurgitation is not visualized. No evidence of pulmonic stenosis. Aorta: The aortic root is normal in size and structure. Venous: The inferior vena cava is normal in size with greater than 50% respiratory variability, suggesting right atrial pressure of 3 mmHg. IAS/Shunts: No atrial level shunt detected by color flow Doppler. Additional Comments: 3D was performed not requiring image post processing on an independent workstation and was abnormal. LEFT VENTRICLE PLAX 2D LVOT diam:     2.00 cm LVOT Area:     3.14 cm  AORTIC VALVE AV Vmax:      290.00 cm/s AV Vmean:     202.000 cm/s AV VTI:       0.497 m AV Peak Grad: 33.6 mmHg AV Mean Grad: 20.0 mmHg  AORTA Ao Root diam: 3.60 cm Ao Asc diam:  3.20 cm  SHUNTS Systemic Diam: 2.00 cm Oneil Parchment MD Electronically signed by Oneil Parchment MD Signature Date/Time: 06/18/2024/10:17:10 AM    Final    EP STUDY Result Date: 06/18/2024 See surgical note for result.          LOS: 4 days   Time spent= 35 mins    Burgess JAYSON Dare, MD [facility]  If 7PM-7AM, please contact night-coverage  06/19/2024, 1:38 PM

## 2024-06-19 NOTE — TOC Initial Note (Signed)
 Transition of Care [facility]) - Initial/Assessment Note    Patient Details  Name: Kyle Matthews MRN: [id number] Date of Birth: 1946-07-25  Transition of Care [facility]) CM/SW Contact:    Sudie Erminio Deems, RN Phone Number: 06/19/2024, 3:04 PM  Clinical Narrative: Patient transferred from [facility]. Plan for [facility] on 06-20-24. PTA patient was from home with spouse.  Patient has insurance and PCP and states he gets to appointments without any issues. Case Manager will continue to follow for additional transition of care needs.           Expected Discharge Plan: Home/Self Care Barriers to Discharge: Continued Medical Work up   Patient Goals and CMS Choice Patient states their goals for this hospitalization and ongoing recovery are:: plan to return home with spouse once stable.          Expected Discharge Plan and Services In-house Referral: NA Discharge Planning Services: CM Consult Post Acute Care Choice: NA Living arrangements for the past 2 months: Single Family Home                           HH Arranged: NA          Prior Living Arrangements/Services Living arrangements for the past 2 months: Single Family Home Lives with:: Spouse Patient language and need for interpreter reviewed:: Yes Do you feel safe going back to the place where you live?: Yes      Need for Family Participation in Patient Care: No (Comment) Care giver support system in place?: No (comment) Current home services: DME (cane) Criminal Activity/Legal Involvement Pertinent to Current Situation/Hospitalization: No - Comment as needed  Activities of Daily Living   ADL Screening (condition at time of admission) Independently performs ADLs?: Yes (appropriate for developmental age) Is the patient deaf or have difficulty hearing?: No Does the patient have difficulty seeing, even when wearing glasses/contacts?: No Does the patient have difficulty concentrating, remembering, or making  decisions?: No  Permission Sought/Granted Permission sought to share information with : Family Supports, Case Manager                Emotional Assessment Appearance:: Appears stated age Attitude/Demeanor/Rapport: Engaged Affect (typically observed): Appropriate Orientation: : Oriented to Situation, Oriented to  Time, Oriented to Place, Oriented to Self Alcohol / Substance Use: Not Applicable Psych Involvement: No (comment)  Admission diagnosis:  Chest pain [R07.9] Chest pain, unspecified type [R07.9] Congestive heart failure, unspecified HF chronicity, unspecified heart failure type (HCC) [I50.9] Bacteremia [R78.81] Patient Active Problem List   Diagnosis Date Noted   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   Congestive heart failure (HCC) 06/18/2024   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   Acute stroke due to ischemia (HCC) 06/18/2024   Acute embolic stroke (HCC) 06/15/2024   Bacteremia 06/14/2024   SIRS (systemic inflammatory response syndrome) (HCC) 06/13/2024   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   S/P AVR (aortic valve replacement) 11/02/2023   Chronic heart failure with preserved ejection fraction (HFpEF) (HCC) 11/01/2023   Acute blood loss anemia 10/30/2023   Benign hypertension 10/30/2023   Acute heart failure with preserved ejection fraction (HFpEF) (HCC) 10/28/2023   Atherosclerosis of native coronary artery of native heart without angina pectoris 10/28/2023   Pressure injury of skin 10/28/2023   Chest pain 10/23/2023   History of essential hypertension 10/23/2023   GERD (gastroesophageal reflux disease)  10/23/2023   NSTEMI (non-ST elevated myocardial infarction) (HCC) 10/11/2023   Aortic valve disease 10/11/2023   Coronary artery disease involving native coronary artery of native heart with unstable angina pectoris (HCC) 10/10/2023   Chronic blood  loss anemia 10/10/2023   Gross hematuria 10/07/2023   BPH with urinary obstruction 10/04/2023   Acute on chronic diastolic CHF (congestive heart failure) (HCC) 10/04/2023   Symptomatic anemia 10/03/2023   Kidney lesion, native, left 10/03/2023   Acute respiratory failure with hypoxia (HCC) 10/03/2023   Lesion of right native kidney 10/03/2023   Elevated brain natriuretic peptide (BNP) level 10/03/2023   Coronary artery disease 10/03/2023   SOB (shortness of breath) 10/03/2023   Bladder outlet obstruction 10/03/2023   Chronic indwelling Foley catheter 10/03/2023   Hematuria 10/03/2023   Enlarged prostate 10/03/2023   Moderate aortic regurgitation 10/03/2023   Hyperlipidemia 05/20/2017   Nicotine dependence, cigarettes, uncomplicated 05/20/2017   Carotid bruit 05/19/2017   Essential hypertension    RBBB 04/13/2017   Severe aortic stenosis 04/13/2017   PCP:  Verena Mems, MD Pharmacy:   [facility] [phone] - [place], [place] - [address] AT [address] & [address] [place] [address] Phone: [phone] Fax: [phone]  [facility] [address] [place] [address] Phone: [phone] Fax: [phone]     Social Drivers of Health (SDOH) Social History: SDOH Screenings   Food Insecurity: No Food Insecurity (06/12/2024)  Recent Concern: Food Insecurity - Food Insecurity Present (05/22/2024)  Housing: Low Risk  (06/12/2024)  Transportation Needs: No Transportation Needs (06/12/2024)  Utilities: Not At Risk (06/12/2024)  Depression (PHQ2-9): High Risk (02/12/2024)  Social Connections: Moderately Integrated (06/12/2024)  Tobacco Use: High Risk (06/12/2024)   SDOH Interventions:     Readmission Risk Interventions    11/08/2023   10:21 AM 10/25/2023    3:42 PM  Readmission Risk Prevention Plan  Transportation Screening Complete Complete  PCP or Specialist Appt within 3-5 Days  Complete  HRI  or Home Care Consult  Complete  Palliative Care Screening  Not Applicable  Medication Review (RN Care Manager)  Complete

## 2024-06-19 NOTE — H&P (View-Only) (Signed)
  Progress Note  Patient Name: Kyle Matthews Date of Encounter: 06/19/2024 [facility] Cardiologist: Arun K Thukkani, MD   Interval Summary   Patient resting comfortably with wife present   Vital Signs Vitals:   06/18/24 1139 06/18/24 1620 06/18/24 2022 06/19/24 0721  BP: 124/69 106/61  134/70  Pulse: 79 85  75  Resp: 18 18 18 20   Temp: 98.7 F (37.1 C) 98.4 F (36.9 C) 98.3 F (36.8 C) 98 F (36.7 C)  TempSrc: Oral Oral Oral Oral  SpO2: 97% 97%  94%  Weight:      Height:       Intake/Output Summary (Last 24 hours) at 06/19/2024 9062 Last data filed at 06/18/2024 1819 Gross per 24 hour  Intake 1520.96 ml  Output --  Net 1520.96 ml      06/12/2024   11:30 PM 06/12/2024    4:57 PM 05/22/2024   11:07 AM  Last 3 Weights  Weight (lbs) 153 lb 3.5 oz 156 lb 8.4 oz 156 lb 8.4 oz  Weight (kg) 69.5 kg 71 kg 71 kg     Telemetry/ECG  Sinus rhythm, HR 70-80s - Personally Reviewed  Physical Exam  GEN: No acute distress.   Neck: No JVD Cardiac: RRR, SEM Respiratory: Clear to auscultation bilaterally. GI: Soft, nontender, non-distended  MS: No edema  Assessment & Plan  78 y.o. male with a history of CAD status post CABG, aortic stenosis status post bioprosthetic AVR in 10/2023, CVA, hypertension, hyperlipidemia who we are consulted for evaluation of infective endocarditis   Prosthetic valve endocarditis  Presented with chills and rigors  Blood cultures showed methicillin resistant Staphylococcus epidermidis  TTE showed increased gradients thru bioprosthetic aortic valve  Started on IV antibiotics and transferred to [facility] TEE showed mobile valvular vegetation at 1.5 cm, mean gradient 20 mmHg, trivial AI, also showed 1 cm vegetation on tricuspid valve with moderate TR CT surgery and ID consulted  CT surgery discussed plans for surgical intervention, patient would like to proceed with surgery  Informed Consent   Shared Decision Making/Informed Consent The risks [stroke (1  in 1000), death (1 in 1000), kidney failure [usually temporary] (1 in 500), bleeding (1 in 200), allergic reaction [possibly serious] (1 in 200)], benefits (diagnostic support and management of coronary artery disease) and alternatives of a cardiac catheterization were discussed in detail with Kyle Matthews and he is willing to proceed.     Chest pain  Elevated troponin level  Troponin 99 > 86 Echo showed normal EF  Suspect likely due to demand ischemia   Acute CVA  Brain MRI showed small acute infarct in left occipital lobe  Suspect its due to prosthetic aortic valve endocarditis  Followed by neurology    For questions or updates, please contact [facility] Please consult [email or web address] for contact info under       Signed, Kyle DELENA Donath, PA-C   Patient seen and examined.  Agree with above documentation.  On exam, patient is alert and oriented, regular rate and rhythm, 2/6 systolic murmur, lungs CTAB, no LE edema or JVD.  He is planning on proceeding with redo AVR with Dr. Lucas.  Will need preop LHC/RHC, will schedule for tomorrow.  Kyle LITTIE Nanas, MD

## 2024-06-19 NOTE — Care Management Important Message (Signed)
 Important Message  Patient Details  Name: Oluwadamilola Rosamond MRN: [id number] Date of Birth: [birth year]   Important Message Given:        Vonzell Arrie Sharps 06/19/2024, 10:23 AM

## 2024-06-19 NOTE — Progress Notes (Signed)
 Pharmacy Antibiotic Note  Kyle Matthews is a 78 y.o. male admitted on 06/12/2024 with MRSE bacteremia. Pt noted to have vegetation on prosthetic aortic valve and tricuspid valve, and also possible CNS emboli.  Pharmacy has been consulted for vancomycin  dosing. Cr has remained <1, vancomycin  trough is subtherapeutic at 12 mcg/ml.  Plan: Increase vancomycin  to 1500mg  IV q12h Repeat VT at Css  Height: 5' 2 (157.5 cm) Weight: 69.5 kg (153 lb 3.5 oz) IBW/kg (Calculated) : 54.6  Temp (24hrs), Avg:98.2 F (36.8 C), Min:98 F (36.7 C), Max:98.4 F (36.9 C)  Recent Labs  Lab 06/13/24 0454 06/13/24 0937 06/14/24 0426 06/15/24 0306 06/16/24 0230 06/16/24 1201 06/17/24 0312 06/17/24 0916 06/18/24 0441 06/19/24 0429 06/19/24 1033  WBC 14.0*  --  14.2* 11.3* 14.0*  --  11.5*  --   --   --   --   CREATININE 0.64  --  0.75 0.76  --   --  0.59*  --  0.62 0.65  --   LATICACIDVEN  --  1.1  --   --   --   --   --   --   --   --   --   VANCOTROUGH  --   --   --   --   --   --   --  4*  --   --  12*  VANCOPEAK  --   --   --   --   --  29*  --   --   --   --   --     Estimated Creatinine Clearance: 68.4 mL/min (by C-G formula based on SCr of 0.65 mg/dL).    Allergies  Allergen Reactions   Tetanus Toxoids Other (See Comments)    Childhood Allergy    Zetia  [Ezetimibe ] Diarrhea    Antimicrobials this admission: Vanco 6/27 >>  Microbiology results: 6/27 BCx: NGTD 6/25 Bcx: gram + cocci 2/2 sets 6/25 BCID: staph species/staph epi  Thank you for allowing pharmacy to be a part of this patient's care.   Ozell Jamaica, PharmD, BCPS, [facility] Clinical Pharmacist [phone] Please check AMION for all [facility] Pharmacy numbers 06/19/2024

## 2024-06-19 NOTE — Progress Notes (Signed)
 Subjective: No new complaints   Antibiotics:  Anti-infectives (From admission, onward)    Start     Dose/Rate Route Frequency Ordered Stop   06/19/24 2200  vancomycin  (VANCOREADY) IVPB 1500 mg/300 mL        1,500 mg 150 mL/hr over 120 Minutes Intravenous Every 12 hours 06/19/24 1246     06/18/24 0000  vancomycin  (VANCOCIN ) IVPB 1000 mg/200 mL premix  Status:  Discontinued        1,000 mg 200 mL/hr over 60 Minutes Intravenous Every 12 hours 06/17/24 1115 06/19/24 1246   06/15/24 1000  vancomycin  (VANCOREADY) IVPB 1250 mg/250 mL  Status:  Discontinued        1,250 mg 166.7 mL/hr over 90 Minutes Intravenous Every 24 hours 06/14/24 1029 06/17/24 1115   06/14/24 0845  vancomycin  (VANCOREADY) IVPB 1500 mg/300 mL        1,500 mg 150 mL/hr over 120 Minutes Intravenous  Once 06/14/24 0747 06/14/24 1133   06/13/24 0000  ciprofloxacin  (CIPRO ) tablet 500 mg  Status:  Discontinued        500 mg Oral 2 times daily 06/12/24 2311 06/13/24 0854       Medications: Scheduled Meds:  aspirin   81 mg Oral Daily   finasteride   5 mg Oral QPM   pantoprazole  (PROTONIX ) IV  40 mg Intravenous Q24H   rosuvastatin   40 mg Oral Daily   Continuous Infusions:  [START ON 06/20/2024] sodium chloride      Followed by   NOREEN ON 06/20/2024] sodium chloride      vancomycin      PRN Meds:.acetaminophen  **OR** acetaminophen , diphenhydrAMINE -zinc acetate, furosemide , ondansetron  **OR** ondansetron  (ZOFRAN ) IV, polyethylene glycol    Objective: Weight change:   Intake/Output Summary (Last 24 hours) at 06/19/2024 1348 Last data filed at 06/19/2024 1207 Gross per 24 hour  Intake 2083 ml  Output --  Net 2083 ml   Blood pressure 126/65, pulse 83, temperature 98.2 F (36.8 C), temperature source Oral, resp. rate 18, height 5' 2 (1.575 m), weight 69.5 kg, SpO2 100%. Temp:  [98 F (36.7 C)-98.4 F (36.9 C)] 98.2 F (36.8 C) (07/02 1209) Pulse Rate:  [75-85] 83 (07/02 1209) Resp:  [18-20] 18 (07/02  1209) BP: (106-134)/(61-70) 126/65 (07/02 1209) SpO2:  [94 %-100 %] 100 % (07/02 1209)  Physical Exam: Physical Exam Constitutional:      Appearance: He is well-developed.  HENT:     Head: Normocephalic and atraumatic.  Eyes:     Conjunctiva/sclera: Conjunctivae normal.  Cardiovascular:     Rate and Rhythm: Normal rate and regular rhythm.     Heart sounds: Murmur heard.     No friction rub. No gallop.  Pulmonary:     Effort: Pulmonary effort is normal. No respiratory distress.     Breath sounds: Normal breath sounds. No wheezing.  Abdominal:     General: There is no distension.     Palpations: Abdomen is soft.  Musculoskeletal:        General: Normal range of motion.     Cervical back: Normal range of motion and neck supple.  Skin:    General: Skin is warm and dry.     Findings: No erythema or rash.  Neurological:     General: No focal deficit present.     Mental Status: He is alert and oriented to person, place, and time.  Psychiatric:        Mood and Affect: Mood normal.  Behavior: Behavior normal.        Thought Content: Thought content normal.        Judgment: Judgment normal.      CBC:    BMET Recent Labs    06/18/24 0441 06/19/24 0429  NA 134* 134*  K 3.3* 3.5  CL 102 103  CO2 24 24  GLUCOSE 91 113*  BUN 9 10  CREATININE 0.62 0.65  CALCIUM  8.3* 8.3*     Liver Panel  No results for input(s): PROT, ALBUMIN , AST, ALT, ALKPHOS, BILITOT, BILIDIR, IBILI in the last 72 hours.     Sedimentation Rate No results for input(s): ESRSEDRATE in the last 72 hours. C-Reactive Protein No results for input(s): CRP in the last 72 hours.  Micro Results: Recent Results (from the past 720 hours)  Culture, blood (Routine X 2) w Reflex to ID Panel     Status: Abnormal   Collection Time: 06/12/24 10:55 PM   Specimen: BLOOD  Result Value Ref Range Status   Specimen Description   Final    BLOOD BLOOD LEFT ARM Performed at [facility], [address] [place], [place] [address]    Special Requests   Final    BOTTLES DRAWN AEROBIC AND ANAEROBIC Blood Culture adequate volume Performed at [facility], [address], [place], [place] [address]    Culture  Setup Time   Final    GRAM POSITIVE COCCI IN BOTH AEROBIC AND ANAEROBIC BOTTLES CRITICAL VALUE NOTED.  VALUE IS CONSISTENT WITH PREVIOUSLY REPORTED AND CALLED VALUE. JESSICA ELLER AT 2315 06/13/24 BY A. SNYDER GRAM STAIN REVIEWED-AGREE WITH RESULT    Culture (A)  Final    STAPHYLOCOCCUS EPIDERMIDIS SUSCEPTIBILITIES PERFORMED ON PREVIOUS CULTURE WITHIN THE LAST 5 DAYS. Performed at [facility], [address], [place], [place] [address]    Report Status 06/17/2024 FINAL  Final  Culture, blood (Routine X 2) w Reflex to ID Panel     Status: Abnormal   Collection Time: 06/12/24 10:57 PM   Specimen: BLOOD LEFT HAND  Result Value Ref Range Status   Specimen Description   Final    BLOOD LEFT HAND Performed at [facility], [address] [place], [place] [address]    Special Requests   Final    BOTTLES DRAWN AEROBIC AND ANAEROBIC Blood Culture adequate volume Performed at [facility], [address] [place], [place] [address]    Culture  Setup Time   Final    GRAM POSITIVE COCCI IN BOTH AEROBIC AND ANAEROBIC BOTTLES Gram Stain Report Called to,Read Back By and Verified With: JESSICA ELLER,RN AT 2315 06/13/24 BY A. SNYDER CRITICAL RESULT CALLED TO, READ BACK BY AND VERIFIED WITH: LOISE MORONES RN 06/14/2024 @ 0414 BY AB GRAM STAIN REVIEWED-AGREE WITH RESULT Performed at [facility], [address] [place], [place] [address]    Culture STAPHYLOCOCCUS EPIDERMIDIS (A)  Final   Report Status 06/17/2024 FINAL  Final   Organism ID, Bacteria STAPHYLOCOCCUS EPIDERMIDIS  Final      Susceptibility   Staphylococcus epidermidis - MIC*    CIPROFLOXACIN  >=8 RESISTANT Resistant     ERYTHROMYCIN >=8 RESISTANT Resistant     GENTAMICIN <=0.5 SENSITIVE Sensitive      OXACILLIN >=4 RESISTANT Resistant     TETRACYCLINE >=16 RESISTANT Resistant     VANCOMYCIN  <=0.5 SENSITIVE Sensitive     TRIMETH/SULFA 160 RESISTANT Resistant     CLINDAMYCIN >=8 RESISTANT Resistant     RIFAMPIN <=0.5 SENSITIVE Sensitive  Inducible Clindamycin NEGATIVE Sensitive     * STAPHYLOCOCCUS EPIDERMIDIS  Blood Culture ID Panel (Reflexed)     Status: Abnormal   Collection Time: 06/12/24 10:57 PM  Result Value Ref Range Status   Enterococcus faecalis NOT DETECTED NOT DETECTED Final   Enterococcus Faecium NOT DETECTED NOT DETECTED Final   Listeria monocytogenes NOT DETECTED NOT DETECTED Final   Staphylococcus species DETECTED (A) NOT DETECTED Final    Comment: CRITICAL RESULT CALLED TO, READ BACK BY AND VERIFIED WITH: N BAUGH RN 06/14/2024 @ 0414 BY AB    Staphylococcus aureus (BCID) NOT DETECTED NOT DETECTED Final   Staphylococcus epidermidis DETECTED (A) NOT DETECTED Final    Comment: Methicillin (oxacillin) resistant coagulase negative staphylococcus. Possible blood culture contaminant (unless isolated from more than one blood culture draw or clinical case suggests pathogenicity). No antibiotic treatment is indicated for blood  culture contaminants. CRITICAL RESULT CALLED TO, READ BACK BY AND VERIFIED WITH: N BAUGH RN 06/14/2024 @ 0414 BY AB    Staphylococcus lugdunensis NOT DETECTED NOT DETECTED Final   Streptococcus species NOT DETECTED NOT DETECTED Final   Streptococcus agalactiae NOT DETECTED NOT DETECTED Final   Streptococcus pneumoniae NOT DETECTED NOT DETECTED Final   Streptococcus pyogenes NOT DETECTED NOT DETECTED Final   A.calcoaceticus-baumannii NOT DETECTED NOT DETECTED Final   Bacteroides fragilis NOT DETECTED NOT DETECTED Final   Enterobacterales NOT DETECTED NOT DETECTED Final   Enterobacter cloacae complex NOT DETECTED NOT DETECTED Final   Escherichia coli NOT DETECTED NOT DETECTED Final   Klebsiella aerogenes NOT DETECTED NOT DETECTED Final    Klebsiella oxytoca NOT DETECTED NOT DETECTED Final   Klebsiella pneumoniae NOT DETECTED NOT DETECTED Final   Proteus species NOT DETECTED NOT DETECTED Final   Salmonella species NOT DETECTED NOT DETECTED Final   Serratia marcescens NOT DETECTED NOT DETECTED Final   Haemophilus influenzae NOT DETECTED NOT DETECTED Final   Neisseria meningitidis NOT DETECTED NOT DETECTED Final   Pseudomonas aeruginosa NOT DETECTED NOT DETECTED Final   Stenotrophomonas maltophilia NOT DETECTED NOT DETECTED Final   Candida albicans NOT DETECTED NOT DETECTED Final   Candida auris NOT DETECTED NOT DETECTED Final   Candida glabrata NOT DETECTED NOT DETECTED Final   Candida krusei NOT DETECTED NOT DETECTED Final   Candida parapsilosis NOT DETECTED NOT DETECTED Final   Candida tropicalis NOT DETECTED NOT DETECTED Final   Cryptococcus neoformans/gattii NOT DETECTED NOT DETECTED Final   Methicillin resistance mecA/C DETECTED (A) NOT DETECTED Final    Comment: CRITICAL RESULT CALLED TO, READ BACK BY AND VERIFIED WITHBETHA LOISE MORONES RN 06/14/2024 @ 0414 BY AB Performed at [facility], [address], [place], [place] [address]   Urine Culture     Status: Abnormal   Collection Time: 06/13/24  1:20 PM   Specimen: Urine, Clean Catch  Result Value Ref Range Status   Specimen Description   Final    URINE, CLEAN CATCH Performed at [facility], [address] [place], [place] [address]    Special Requests   Final    NONE Performed at [facility], [address] [place], [place] [address]    Culture (A)  Final    <10,000 COLONIES/mL INSIGNIFICANT GROWTH Performed at [facility], [address], [place], [place] [address]    Report Status 06/14/2024 FINAL  Final  Culture, blood (Routine X 2) w Reflex to ID Panel     Status: Abnormal   Collection Time: 06/14/24  7:44 AM   Specimen:  BLOOD  Result Value Ref Range Status   Specimen Description   Final    BLOOD RIGHT ANTECUBITAL Performed at [facility], [address] [place], [place] [address]    Special Requests   Final    BOTTLES DRAWN AEROBIC AND ANAEROBIC Blood Culture adequate volume Performed at [facility], [address], [place], [place] [address]    Culture  Setup Time   Final    GRAM POSITIVE COCCI IN BOTH AEROBIC AND ANAEROBIC BOTTLES Gram Stain Report Called to,Read Back By and Verified With: D. JOSHUA ON 06/15/2024 @12 :50 BY T.HAMER Performed at [facility], [address], [place], [place] [address]    Culture (A)  Final    STAPHYLOCOCCUS EPIDERMIDIS SUSCEPTIBILITIES PERFORMED ON PREVIOUS CULTURE WITHIN THE LAST 5 DAYS. Performed at [facility], [address] [place], [place] [address]    Report Status 06/17/2024 FINAL  Final  Culture, blood (Routine X 2) w Reflex to ID Panel     Status: Abnormal   Collection Time: 06/14/24  7:44 AM   Specimen: BLOOD  Result Value Ref Range Status   Specimen Description   Final    BLOOD LEFT ANTECUBITAL Performed at [facility], [address], [place], [place] [address]    Special Requests   Final    BOTTLES DRAWN AEROBIC AND ANAEROBIC Blood Culture adequate volume Performed at [facility], [address], [place], [place] [address]    Culture  Setup Time   Final    GRAM POSITIVE COCCI IN BOTH AEROBIC AND ANAEROBIC BOTTLES Gram Stain Report Called to,Read Back By and Verified With: B. ROBERTSON ON 06/15/2024 @13 :49 BY T.HAMER Performed at [facility], [address], [place], [place] [address]    Culture (A)  Final    STAPHYLOCOCCUS EPIDERMIDIS SUSCEPTIBILITIES PERFORMED ON PREVIOUS CULTURE WITHIN THE LAST 5 DAYS. Performed at [facility], [address] [place], [place] [address]    Report Status 06/17/2024 FINAL  Final  Culture, blood (Routine X 2) w Reflex to ID Panel     Status: None (Preliminary result)   Collection Time: 06/16/24  2:30 AM   Specimen: BLOOD LEFT ARM  Result Value Ref Range Status   Specimen Description BLOOD LEFT ARM  Final    Special Requests AEROBIC BOTTLE ONLY Blood Culture adequate volume  Final   Culture   Final    NO GROWTH 3 DAYS Performed at [facility], [address], [place], [place] [address]    Report Status PENDING  Incomplete  Culture, blood (Routine X 2) w Reflex to ID Panel     Status: None (Preliminary result)   Collection Time: 06/16/24  2:35 AM   Specimen: Right Antecubital; Blood  Result Value Ref Range Status   Specimen Description RIGHT ANTECUBITAL  Final   Special Requests   Final    BOTTLES DRAWN AEROBIC AND ANAEROBIC Blood Culture adequate volume   Culture   Final    NO GROWTH 3 DAYS Performed at [facility], [address] [place], [place] [address]    Report Status PENDING  Incomplete    Studies/Results: ECHO TEE Result Date: 06/18/2024    TRANSESOPHOGEAL ECHO REPORT   Patient Name:   Kyle Matthews Date of Exam: 06/18/2024 Medical Rec #:  [id number]  Height:       62.0 in Accession #:    [id number] Weight:       153.2 lb Date of Birth:  February 18, 1946   BSA:  1.707 m Patient Age:    77 years   BP:           118/60 mmHg Patient Gender: M          HR:           94 bpm. Exam Location:  Inpatient Procedure: 3D Echo, Transesophageal Echo, Cardiac Doppler and Color Doppler            (Both Spectral and Color Flow Doppler were utilized during            procedure). Indications:     Endocarditis  History:         Patient has prior history of Echocardiogram examinations, most                  recent 06/13/2024. AVR Inspiris Resilia prosthesis 11/02/23.                  Aortic Valve: unknown Inspiris Resilia valve is present in the                  aortic position. Procedure Date: 11/02/23.  Sonographer:     Tinnie Gosling RDCS Referring Phys:  [id number] LORETTE CINDERELLA KAPUR Diagnosing Phys: Oneil Parchment MD PROCEDURE: TEE procedure time was 30 minutes. The transesophogeal probe was passed without difficulty through the esophogus of the patient. Imaged were obtained with the patient in a left lateral decubitus  position. Sedation performed by different physician. Image quality was good. The patient's vital signs; including heart rate, blood pressure, and oxygen saturation; remained stable throughout the procedure. The patient developed no complications during the procedure.  IMPRESSIONS  1. Left ventricular ejection fraction, by estimation, is 65 to 70%. The left ventricle has normal function. The left ventricle has no regional wall motion abnormalities.  2. Right ventricular systolic function is normal. The right ventricular size is normal.  3. No left atrial/left atrial appendage thrombus was detected.  4. The mitral valve is normal in structure. No evidence of mitral valve regurgitation. No evidence of mitral stenosis.  5. 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  6. Mobile mass measuring 1.15cm x 1.29cm with a 1.66 cm mobile tail - bioprosthetic valve endocarditis. The aortic valve has been repaired/replaced. Aortic valve regurgitation is trivial. No aortic stenosis is present. There is a unknown Inspiris Resilia valve present in the aortic position. Procedure Date: 11/02/23. Aortic valve mean gradient measures 20.0 mmHg. Aortic valve Vmax measures 2.90 m/s.  7. The inferior vena cava is normal in size with greater than 50% respiratory variability, suggesting right atrial pressure of 3 mmHg.  8. 3D performed of the aortic valve and demonstrates AV vegetation. Conclusion(s)/Recommendation(s): Findings are concerning for vegetation/infective endocarditis as detailed above. Discussed with consultation team. FINDINGS  Left Ventricle: Left ventricular ejection fraction, by estimation, is 65 to 70%. The left ventricle has normal function. The left ventricle has no regional wall motion abnormalities. The left ventricular internal cavity size was normal in size. There is  no left ventricular hypertrophy. Right  Ventricle: The right ventricular size is normal. No increase in right ventricular wall thickness. Right ventricular systolic function is normal. Left Atrium: Left atrial size was normal in size. No left atrial/left atrial appendage thrombus was detected. Right Atrium: Right atrial size was normal in size. Pericardium: There is no evidence of pericardial effusion. Mitral Valve: The mitral valve is normal in structure. Mild  mitral annular calcification. No evidence of mitral valve regurgitation. No evidence of mitral valve stenosis. 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 No evidence of tricuspid stenosis. Aortic Valve: Mobile mass measuring 1.15cm x 1.29cm with a 1.66 cm mobile tail - bioprosthetic valve endocarditis. The aortic valve has been repaired/replaced. Aortic valve regurgitation is trivial. No aortic stenosis is present. Aortic valve mean gradient measures 20.0 mmHg. Aortic valve peak gradient measures 33.6 mmHg. There is a unknown Inspiris Resilia valve present in the aortic position. Procedure Date: 11/02/23. Pulmonic Valve: The pulmonic valve was normal in structure. Pulmonic valve regurgitation is not visualized. No evidence of pulmonic stenosis. Aorta: The aortic root is normal in size and structure. Venous: The inferior vena cava is normal in size with greater than 50% respiratory variability, suggesting right atrial pressure of 3 mmHg. IAS/Shunts: No atrial level shunt detected by color flow Doppler. Additional Comments: 3D was performed not requiring image post processing on an independent workstation and was abnormal. LEFT VENTRICLE PLAX 2D LVOT diam:     2.00 cm LVOT Area:     3.14 cm  AORTIC VALVE AV Vmax:      290.00 cm/s AV Vmean:     202.000 cm/s AV VTI:       0.497 m AV Peak Grad: 33.6 mmHg AV Mean Grad: 20.0 mmHg  AORTA Ao Root diam: 3.60 cm Ao Asc diam:   3.20 cm  SHUNTS Systemic Diam: 2.00 cm Oneil Parchment MD Electronically signed by Oneil Parchment MD Signature Date/Time: 06/18/2024/10:17:10 AM    Final    EP STUDY Result Date: 06/18/2024 See surgical note for result.     Assessment/Plan:  INTERVAL HISTORY: Patient seen by CT surgery being seen by cardiology today   Principal Problem:   Endocarditis of prosthetic aortic valve Active Problems:   Essential hypertension   Chest pain   Chronic heart failure with preserved ejection fraction (HFpEF) (HCC)   S/P CABG x 2   S/P AVR (aortic valve replacement)   BPH with obstruction/lower urinary tract symptoms   SIRS (systemic inflammatory response syndrome) (HCC)   Bacteremia   Acute embolic stroke (HCC)   Endocarditis of tricuspid valve   Congestive heart failure (HCC)   Prosthetic valve endocarditis   Acute stroke due to ischemia (HCC)    Kyle Matthews is a 78 y.o. 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  The culprit organism is methicillin resistant Staphylococcus epidermidis   #1 Prosthetic and native valve endocarditis:  I agree with Dr. Ygnacio that the patient needs cardiothoracic surgery in my understanding is not only with aortic valve replacement attentionally also the tricuspid valve.  Reiterated this to the patient and his wife who is at the bedside and they seem to understand completely.  Cardiology are planning on left and right-sided cardiac catheterization preoperatively.  In the interim we will continue on vancomycin .  Maintain close eye on his levels as well as renal function for therapeutic drug monitoring.  I would like to do 6 weeks of effective postoperative antibiotics  Routine standard precautions  I have personally spent 52 minutes involved in face-to-face and  non-face-to-face activities for this patient on the day of the visit. Professional time spent includes the following activities:  Preparing to see the patient (review of tests), Obtaining and/or reviewing separately obtained history (admission/discharge record), Performing a medically appropriate examination and/or evaluation , Ordering medications/tests/procedures, referring and communicating with other health care professionals, Documenting clinical information in the EMR, Independently interpreting results (not separately reported), Communicating results to the patient/family/caregiver, Counseling and educating the patient/family/caregiver and Care coordination (not separately reported).   Evaluation of the patient requires complex antimicrobial therapy evaluation, counseling , isolation needs to reduce disease transmission and risk assessment and mitigation.     LOS: 4 days   Jomarie Fleeta Rothman 06/19/2024, 1:48 PM

## 2024-06-20 ENCOUNTER — Encounter (HOSPITAL_COMMUNITY)
Admission: EM | Disposition: A | Discharge status: Home Health | Payer: Self-pay | Source: Other Acute Inpatient Hospital | Attending: Surgery

## 2024-06-20 DIAGNOSIS — I330 Acute and subacute infective endocarditis: Secondary | ICD-10-CM | POA: Diagnosis not present

## 2024-06-20 DIAGNOSIS — I082 Rheumatic disorders of both aortic and tricuspid valves: Secondary | ICD-10-CM | POA: Diagnosis not present

## 2024-06-20 DIAGNOSIS — T826XXA Infection and inflammatory reaction due to cardiac valve prosthesis, initial encounter: Secondary | ICD-10-CM | POA: Diagnosis not present

## 2024-06-20 DIAGNOSIS — I2510 Atherosclerotic heart disease of native coronary artery without angina pectoris: Secondary | ICD-10-CM | POA: Diagnosis not present

## 2024-06-20 DIAGNOSIS — Z952 Presence of prosthetic heart valve: Secondary | ICD-10-CM | POA: Diagnosis not present

## 2024-06-20 DIAGNOSIS — R7881 Bacteremia: Secondary | ICD-10-CM | POA: Diagnosis not present

## 2024-06-20 DIAGNOSIS — I639 Cerebral infarction, unspecified: Secondary | ICD-10-CM | POA: Diagnosis not present

## 2024-06-20 HISTORY — PX: RIGHT HEART CATH AND CORONARY ANGIOGRAPHY: CATH118264

## 2024-06-20 LAB — POCT I-STAT EG7
Acid-base deficit: 1 mmol/L (ref 0.0–2.0)
Bicarbonate: 24.1 mmol/L (ref 20.0–28.0)
Calcium, Ion: 1.25 mmol/L (ref 1.15–1.40)
HCT: 27 % — ABNORMAL LOW (ref 39.0–52.0)
Hemoglobin: 9.2 g/dL — ABNORMAL LOW (ref 13.0–17.0)
O2 Saturation: 72 %
Potassium: 3.8 mmol/L (ref 3.5–5.1)
Sodium: 137 mmol/L (ref 135–145)
TCO2: 25 mmol/L (ref 22–32)
pCO2, Ven: 40.2 mmHg — ABNORMAL LOW (ref 44–60)
pH, Ven: 7.385 (ref 7.25–7.43)
pO2, Ven: 39 mmHg (ref 32–45)

## 2024-06-20 LAB — BASIC METABOLIC PANEL WITH GFR
Anion gap: 9 (ref 5–15)
BUN: 10 mg/dL (ref 8–23)
CO2: 24 mmol/L (ref 22–32)
Calcium: 8.6 mg/dL — ABNORMAL LOW (ref 8.9–10.3)
Chloride: 103 mmol/L (ref 98–111)
Creatinine, Ser: 0.91 mg/dL (ref 0.61–1.24)
GFR, Estimated: >60 mL/min (ref >60)
Glucose, Bld: 104 mg/dL — ABNORMAL HIGH (ref 70–99)
Potassium: 3.6 mmol/L (ref 3.5–5.1)
Sodium: 136 mmol/L (ref 135–145)

## 2024-06-20 LAB — CBC
HCT: 29.1 % — ABNORMAL LOW (ref 39.0–52.0)
Hemoglobin: 9.7 g/dL — ABNORMAL LOW (ref 13.0–17.0)
MCH: 30.2 pg (ref 26.0–34.0)
MCHC: 33.3 g/dL (ref 30.0–36.0)
MCV: 90.7 fL (ref 80.0–100.0)
Platelets: 189 10*3/uL (ref 150–400)
RBC: 3.21 MIL/uL — ABNORMAL LOW (ref 4.22–5.81)
RDW: 13.6 % (ref 11.5–15.5)
WBC: 12.5 10*3/uL — ABNORMAL HIGH (ref 4.0–10.5)
nRBC: 0 % (ref 0.0–0.2)

## 2024-06-20 LAB — POCT I-STAT 7, (LYTES, BLD GAS, ICA,H+H)
Acid-base deficit: 2 mmol/L (ref 0.0–2.0)
Bicarbonate: 22.9 mmol/L (ref 20.0–28.0)
Calcium, Ion: 1.16 mmol/L (ref 1.15–1.40)
HCT: 26 % — ABNORMAL LOW (ref 39.0–52.0)
Hemoglobin: 8.8 g/dL — ABNORMAL LOW (ref 13.0–17.0)
O2 Saturation: 98 %
Potassium: 3.4 mmol/L — ABNORMAL LOW (ref 3.5–5.1)
Sodium: 139 mmol/L (ref 135–145)
TCO2: 24 mmol/L (ref 22–32)
pCO2 arterial: 40 mmHg (ref 32–48)
pH, Arterial: 7.367 (ref 7.35–7.45)
pO2, Arterial: 100 mmHg (ref 83–108)

## 2024-06-20 SURGERY — RIGHT HEART CATH AND CORONARY ANGIOGRAPHY
Anesthesia: LOCAL

## 2024-06-20 MED ORDER — HEPARIN SODIUM (PORCINE) 1000 UNIT/ML IJ SOLN
INTRAMUSCULAR | Status: DC | PRN
  Administered 2024-06-20: 4000 [IU] via INTRAVENOUS

## 2024-06-20 MED ORDER — MIDAZOLAM HCL 2 MG/2ML IJ SOLN
INTRAMUSCULAR | Status: DC | PRN
  Administered 2024-06-20: 2 mg via INTRAVENOUS

## 2024-06-20 MED ORDER — VERAPAMIL HCL 2.5 MG/ML IV SOLN
INTRAVENOUS | Status: AC
  Filled 2024-06-20: qty 2

## 2024-06-20 MED ORDER — FENTANYL CITRATE (PF) 100 MCG/2ML IJ SOLN
INTRAMUSCULAR | Status: AC
  Filled 2024-06-20: qty 2

## 2024-06-20 MED ORDER — FENTANYL CITRATE (PF) 100 MCG/2ML IJ SOLN
INTRAMUSCULAR | Status: DC | PRN
  Administered 2024-06-20: 25 ug via INTRAVENOUS

## 2024-06-20 MED ORDER — SODIUM CHLORIDE 0.9 % IV SOLN: 250.0000 mL | INTRAVENOUS | Status: AC | PRN

## 2024-06-20 MED ORDER — MIDAZOLAM HCL 2 MG/2ML IJ SOLN
INTRAMUSCULAR | Status: AC
  Filled 2024-06-20: qty 2

## 2024-06-20 MED ORDER — LIDOCAINE HCL (PF) 1 % IJ SOLN
INTRAMUSCULAR | Status: DC | PRN
  Administered 2024-06-20: 5 mL
  Administered 2024-06-20: 2 mL

## 2024-06-20 MED ORDER — SODIUM CHLORIDE 0.9% FLUSH
3.0000 mL | INTRAVENOUS | Status: DC | PRN
Start: 2024-06-20 — End: 2024-06-26
  Administered 2024-06-23: 3 mL via INTRAVENOUS

## 2024-06-20 MED ORDER — HEPARIN (PORCINE) IN NACL 1000-0.9 UT/500ML-% IV SOLN
INTRAVENOUS | Status: DC | PRN
  Administered 2024-06-20 (×2): 500 mL

## 2024-06-20 MED ORDER — HEPARIN SODIUM (PORCINE) 1000 UNIT/ML IJ SOLN
INTRAMUSCULAR | Status: AC
  Filled 2024-06-20: qty 10

## 2024-06-20 MED ORDER — LIDOCAINE HCL (PF) 1 % IJ SOLN
INTRAMUSCULAR | Status: AC
  Filled 2024-06-20: qty 30

## 2024-06-20 MED ORDER — IOHEXOL 350 MG/ML SOLN
INTRAVENOUS | Status: DC | PRN
  Administered 2024-06-20: 80 mL

## 2024-06-20 MED ORDER — VERAPAMIL HCL 2.5 MG/ML IV SOLN
INTRAVENOUS | Status: DC | PRN
  Administered 2024-06-20: 10 mL via INTRA_ARTERIAL

## 2024-06-20 SURGICAL SUPPLY — 13 items
CATH BALLN WEDGE 5F 110CM (CATHETERS) IMPLANT
CATH INFINITI 5 FR AR1 MOD (CATHETERS) IMPLANT
CATH INFINITI 5 FR IM (CATHETERS) IMPLANT
CATH INFINITI 5FR MPB2 (CATHETERS) IMPLANT
CATH INFINITI 5FR MULTPACK ANG (CATHETERS) IMPLANT
CATH LAUNCHER 5F NOTO (CATHETERS) IMPLANT
COVER PRB 48X5XTLSCP FOLD TPE (BAG) IMPLANT
ELECT DEFIB PAD ADLT CADENCE (PAD) IMPLANT
GLIDESHEATH SLEND A-KIT 6F 22G (SHEATH) IMPLANT
GUIDEWIRE INQWIRE 1.5J.035X260 (WIRE) IMPLANT
PACK CARDIAC CATHETERIZATION (CUSTOM PROCEDURE TRAY) ×2 IMPLANT
SET ATX-X65L (MISCELLANEOUS) IMPLANT
SHEATH GLIDE SLENDER 4/5FR (SHEATH) IMPLANT

## 2024-06-20 NOTE — Progress Notes (Addendum)
  Progress Note  Patient Name: Kyle Matthews Date of Encounter: 06/20/2024 [facility] Cardiologist: Arun K Thukkani, MD   Interval Summary   Patient reports no complaints, ready for LHC/RHC today NPO since midnight   Vital Signs Vitals:   06/19/24 1613 06/19/24 2053 06/20/24 0007 06/20/24 0616  BP: (!) 140/74 134/80 112/74 122/74  Pulse: 77 94 87 80  Resp: 16 18 16 18   Temp: 98.3 F (36.8 C) 98.6 F (37 C) 98.2 F (36.8 C) 98.6 F (37 C)  TempSrc: Oral Oral Oral Oral  SpO2: 97% 98% 96% 99%  Weight:    69 kg  Height:        Intake/Output Summary (Last 24 hours) at 06/20/2024 0727 Last data filed at 06/20/2024 0438 Gross per 24 hour  Intake 2227.22 ml  Output --  Net 2227.22 ml      06/20/2024    6:16 AM 06/12/2024   11:30 PM 06/12/2024    4:57 PM  Last 3 Weights  Weight (lbs) 152 lb 1.9 oz 153 lb 3.5 oz 156 lb 8.4 oz  Weight (kg) 69 kg 69.5 kg 71 kg      Telemetry/ECG  Sinus rhythm, HR 80s - Personally Reviewed  Physical Exam  GEN: No acute distress.   Neck: No JVD Cardiac: RRR, SEM  Respiratory: Clear to auscultation bilaterally. GI: Soft, nontender, non-distended  MS: No edema  Assessment & Plan  78 y.o. male with a history of CAD status post CABG, aortic stenosis status post bioprosthetic AVR in 10/2023, CVA, hypertension, hyperlipidemia who we are consulted for evaluation of infective endocarditis    Prosthetic valve endocarditis  Presented with chills and rigors  Blood cultures showed methicillin resistant Staphylococcus epidermidis  TTE showed increased gradients thru bioprosthetic aortic valve  Started on IV antibiotics and transferred to [facility] TEE showed mobile valvular vegetation at 1.5 cm, mean gradient 20 mmHg, trivial AI, also showed 1 cm vegetation on tricuspid valve with moderate TR CT surgery and ID consulted  CT surgery discussed plans for surgical intervention, patient would like to proceed with surgery, scheduled for 7/11 Undergoing  pre-op RHC/LHC today  NPO since midnight     Chest pain  Elevated troponin level  Troponin 99 > 86 Echo showed normal EF  Suspect likely due to demand ischemia    Acute CVA  Brain MRI showed small acute infarct in left occipital lobe  Suspect its due to prosthetic aortic valve endocarditis  Followed by neurology    For questions or updates, please contact [facility] Please consult [email or web address] for contact info under       Signed, [name], PA-C   Patient seen and examined.  Agree with above documentation.  On exam, patient is alert and oriented, regular rate and rhythm, 2/6 systolic murmur, lungs CTAB, no LE edema or JVD.  LHC today showed patent LIMA-LAD and SVG-ramus, 40 to 50% ostial left main disease.  RHC with RA 5, RV 23/3, PA 23/9/12, PCWP 11, PA sat 72%, CI 3.9.  Planning redo AVR with Dr. Lucas.  [name], MD

## 2024-06-20 NOTE — Progress Notes (Signed)
*   Day of Surgery * Procedure(s) (LRB): RIGHT HEART CATH AND CORONARY ANGIOGRAPHY (N/A) Subjective:  Feels well today. Probably had some food poisoning after he ate dinner last night with N/V within 5 minutes of eating that resolved quickly. No recurrence. No SOB or CP.  Had cath today showing no new coronary stenoses and previous grafts patent.  Objective: Vital signs in last 24 hours: Temp:  [98 F (36.7 C)-98.6 F (37 C)] 98 F (36.7 C) (07/03 1254) Pulse Rate:  [67-94] 79 (07/03 1520) Cardiac Rhythm: Normal sinus rhythm (07/03 0911) Resp:  [16-27] 16 (07/03 1254) BP: (103-167)/(58-128) 103/58 (07/03 1520) SpO2:  [95 %-100 %] 96 % (07/03 1520) Weight:  [69 kg] 69 kg (07/03 0616)  Hemodynamic parameters for last 24 hours:    Intake/Output from previous day: 07/02 0701 - 07/03 0700 In: 2227.2 [P.O.:1680; I.V.:45.2; IV Piggyback:502] Out: -  Intake/Output this shift: Total I/O In: 240 [P.O.:240] Out: -   General appearance: alert and cooperative Neurologic: intact Heart: regular rate and rhythm Lungs: clear to auscultation bilaterally  Lab Results: Recent Labs    06/20/24 0406 06/20/24 1018 06/20/24 1023  WBC 12.5*  --   --   HGB 9.7* 9.2* 8.8*  HCT 29.1* 27.0* 26.0*  PLT 189  --   --    BMET:  Recent Labs    06/19/24 0429 06/20/24 0406 06/20/24 1018 06/20/24 1023  NA 134* 136 137 139  K 3.5 3.6 3.8 3.4*  CL 103 103  --   --   CO2 24 24  --   --   GLUCOSE 113* 104*  --   --   BUN 10 10  --   --   CREATININE 0.65 0.91  --   --   CALCIUM  8.3* 8.6*  --   --     PT/INR: No results for input(s): LABPROT, INR in the last 72 hours. 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 He is in agreement to proceed with surgery. I have scheduled for next Friday but if I can get operative time I will plan to do Wednesday.  LOS: 5 days    Dorise MARLA Fellers 06/20/2024

## 2024-06-20 NOTE — Progress Notes (Signed)
 Subjective: No new complaints   Antibiotics:  Anti-infectives (From admission, onward)    Start     Dose/Rate Route Frequency Ordered Stop   06/19/24 2200  vancomycin  (VANCOREADY) IVPB 1500 mg/300 mL        1,500 mg 150 mL/hr over 120 Minutes Intravenous Every 12 hours 06/19/24 1246     06/18/24 0000  vancomycin  (VANCOCIN ) IVPB 1000 mg/200 mL premix  Status:  Discontinued        1,000 mg 200 mL/hr over 60 Minutes Intravenous Every 12 hours 06/17/24 1115 06/19/24 1246   06/15/24 1000  vancomycin  (VANCOREADY) IVPB 1250 mg/250 mL  Status:  Discontinued        1,250 mg 166.7 mL/hr over 90 Minutes Intravenous Every 24 hours 06/14/24 1029 06/17/24 1115   06/14/24 0845  vancomycin  (VANCOREADY) IVPB 1500 mg/300 mL        1,500 mg 150 mL/hr over 120 Minutes Intravenous  Once 06/14/24 0747 06/14/24 1133   06/13/24 0000  ciprofloxacin  (CIPRO ) tablet 500 mg  Status:  Discontinued        500 mg Oral 2 times daily 06/12/24 2311 06/13/24 0854       Medications: Scheduled Meds:  [START ON 06/21/2024] aspirin   81 mg Oral Daily   finasteride   5 mg Oral QPM   pantoprazole  (PROTONIX ) IV  40 mg Intravenous Q24H   rosuvastatin   40 mg Oral Daily   Continuous Infusions:  sodium chloride      vancomycin  1,500 mg (06/20/24 1137)   PRN Meds:.sodium chloride , acetaminophen  **OR** acetaminophen , diphenhydrAMINE -zinc acetate, furosemide , ondansetron  **OR** ondansetron  (ZOFRAN ) IV, polyethylene glycol, sodium chloride  flush    Objective: Weight change:   Intake/Output Summary (Last 24 hours) at 06/20/2024 1604 Last data filed at 06/20/2024 1518 Gross per 24 hour  Intake 1305.18 ml  Output --  Net 1305.18 ml   Blood pressure (!) 167/107, pulse 75, temperature 98 F (36.7 C), temperature source Oral, resp. rate 16, height 5' 2 (1.575 m), weight 69 kg, SpO2 100%. Temp:  [98 F (36.7 C)-98.6 F (37 C)] 98 F (36.7 C) (07/03 1254) Pulse Rate:  [67-94] 75 (07/03 1308) Resp:  [16-27] 16  (07/03 1254) BP: (112-167)/(63-107) 167/107 (07/03 1308) SpO2:  [96 %-100 %] 100 % (07/03 1308) Weight:  [69 kg] 69 kg (07/03 0616)  Physical Exam: Physical Exam Constitutional:      Appearance: He is well-developed.  HENT:     Head: Normocephalic and atraumatic.  Eyes:     Conjunctiva/sclera: Conjunctivae normal.  Cardiovascular:     Rate and Rhythm: Normal rate and regular rhythm.     Heart sounds: Murmur heard.     No friction rub. No gallop.  Pulmonary:     Effort: Pulmonary effort is normal. No respiratory distress.     Breath sounds: No stridor. No wheezing or rhonchi.  Abdominal:     General: There is no distension.     Palpations: Abdomen is soft.  Musculoskeletal:        General: Normal range of motion.     Cervical back: Normal range of motion and neck supple.  Skin:    General: Skin is warm and dry.     Findings: No erythema or rash.  Neurological:     General: No focal deficit present.     Mental Status: He is alert and oriented to person, place, and time.  Psychiatric:        Mood and Affect: Mood normal.  Behavior: Behavior normal.        Thought Content: Thought content normal.        Judgment: Judgment normal.      CBC:    BMET Recent Labs    06/19/24 0429 06/20/24 0406 06/20/24 1018 06/20/24 1023  NA 134* 136 137 139  K 3.5 3.6 3.8 3.4*  CL 103 103  --   --   CO2 24 24  --   --   GLUCOSE 113* 104*  --   --   BUN 10 10  --   --   CREATININE 0.65 0.91  --   --   CALCIUM  8.3* 8.6*  --   --      Liver Panel  No results for input(s): PROT, ALBUMIN , AST, ALT, ALKPHOS, BILITOT, BILIDIR, IBILI in the last 72 hours.     Sedimentation Rate No results for input(s): ESRSEDRATE in the last 72 hours. C-Reactive Protein No results for input(s): CRP in the last 72 hours.  Micro Results: Recent Results (from the past 720 hours)  Culture, blood (Routine X 2) w Reflex to ID Panel     Status: Abnormal   Collection  Time: 06/12/24 10:55 PM   Specimen: BLOOD  Result Value Ref Range Status   Specimen Description   Final    BLOOD BLOOD LEFT ARM Performed at [facility], [address] [place], [place] [address]    Special Requests   Final    BOTTLES DRAWN AEROBIC AND ANAEROBIC Blood Culture adequate volume Performed at [facility], [address], [place], [place] [address]    Culture  Setup Time   Final    GRAM POSITIVE COCCI IN BOTH AEROBIC AND ANAEROBIC BOTTLES CRITICAL VALUE NOTED.  VALUE IS CONSISTENT WITH PREVIOUSLY REPORTED AND CALLED VALUE. JESSICA ELLER AT 2315 06/13/24 BY A. SNYDER GRAM STAIN REVIEWED-AGREE WITH RESULT    Culture (A)  Final    STAPHYLOCOCCUS EPIDERMIDIS SUSCEPTIBILITIES PERFORMED ON PREVIOUS CULTURE WITHIN THE LAST 5 DAYS. Performed at [facility], [address], [place], [place] [address]    Report Status 06/17/2024 FINAL  Final  Culture, blood (Routine X 2) w Reflex to ID Panel     Status: Abnormal   Collection Time: 06/12/24 10:57 PM   Specimen: BLOOD LEFT HAND  Result Value Ref Range Status   Specimen Description   Final    BLOOD LEFT HAND Performed at [facility], [address], [place], [place] [address]    Special Requests   Final    BOTTLES DRAWN AEROBIC AND ANAEROBIC Blood Culture adequate volume Performed at [facility], [address] [place], [place] [address]    Culture  Setup Time   Final    GRAM POSITIVE COCCI IN BOTH AEROBIC AND ANAEROBIC BOTTLES Gram Stain Report Called to,Read Back By and Verified With: JESSICA ELLER,RN AT 2315 06/13/24 BY A. SNYDER CRITICAL RESULT CALLED TO, READ BACK BY AND VERIFIED WITH: LOISE MORONES RN 06/14/2024 @ 0414 BY AB GRAM STAIN REVIEWED-AGREE WITH RESULT Performed at [facility], [address] [place], [place] [address]    Culture STAPHYLOCOCCUS EPIDERMIDIS (A)  Final   Report Status 06/17/2024 FINAL  Final   Organism ID, Bacteria STAPHYLOCOCCUS EPIDERMIDIS  Final      Susceptibility    Staphylococcus epidermidis - MIC*    CIPROFLOXACIN  >=8 RESISTANT Resistant     ERYTHROMYCIN >=8 RESISTANT Resistant     GENTAMICIN <=0.5 SENSITIVE Sensitive  OXACILLIN >=4 RESISTANT Resistant     TETRACYCLINE >=16 RESISTANT Resistant     VANCOMYCIN  <=0.5 SENSITIVE Sensitive     TRIMETH/SULFA 160 RESISTANT Resistant     CLINDAMYCIN >=8 RESISTANT Resistant     RIFAMPIN <=0.5 SENSITIVE Sensitive     Inducible Clindamycin NEGATIVE Sensitive     * STAPHYLOCOCCUS EPIDERMIDIS  Blood Culture ID Panel (Reflexed)     Status: Abnormal   Collection Time: 06/12/24 10:57 PM  Result Value Ref Range Status   Enterococcus faecalis NOT DETECTED NOT DETECTED Final   Enterococcus Faecium NOT DETECTED NOT DETECTED Final   Listeria monocytogenes NOT DETECTED NOT DETECTED Final   Staphylococcus species DETECTED (A) NOT DETECTED Final    Comment: CRITICAL RESULT CALLED TO, READ BACK BY AND VERIFIED WITH: N BAUGH RN 06/14/2024 @ 0414 BY AB    Staphylococcus aureus (BCID) NOT DETECTED NOT DETECTED Final   Staphylococcus epidermidis DETECTED (A) NOT DETECTED Final    Comment: Methicillin (oxacillin) resistant coagulase negative staphylococcus. Possible blood culture contaminant (unless isolated from more than one blood culture draw or clinical case suggests pathogenicity). No antibiotic treatment is indicated for blood  culture contaminants. CRITICAL RESULT CALLED TO, READ BACK BY AND VERIFIED WITH: N BAUGH RN 06/14/2024 @ 0414 BY AB    Staphylococcus lugdunensis NOT DETECTED NOT DETECTED Final   Streptococcus species NOT DETECTED NOT DETECTED Final   Streptococcus agalactiae NOT DETECTED NOT DETECTED Final   Streptococcus pneumoniae NOT DETECTED NOT DETECTED Final   Streptococcus pyogenes NOT DETECTED NOT DETECTED Final   A.calcoaceticus-baumannii NOT DETECTED NOT DETECTED Final   Bacteroides fragilis NOT DETECTED NOT DETECTED Final   Enterobacterales NOT DETECTED NOT DETECTED Final   Enterobacter  cloacae complex NOT DETECTED NOT DETECTED Final   Escherichia coli NOT DETECTED NOT DETECTED Final   Klebsiella aerogenes NOT DETECTED NOT DETECTED Final   Klebsiella oxytoca NOT DETECTED NOT DETECTED Final   Klebsiella pneumoniae NOT DETECTED NOT DETECTED Final   Proteus species NOT DETECTED NOT DETECTED Final   Salmonella species NOT DETECTED NOT DETECTED Final   Serratia marcescens NOT DETECTED NOT DETECTED Final   Haemophilus influenzae NOT DETECTED NOT DETECTED Final   Neisseria meningitidis NOT DETECTED NOT DETECTED Final   Pseudomonas aeruginosa NOT DETECTED NOT DETECTED Final   Stenotrophomonas maltophilia NOT DETECTED NOT DETECTED Final   Candida albicans NOT DETECTED NOT DETECTED Final   Candida auris NOT DETECTED NOT DETECTED Final   Candida glabrata NOT DETECTED NOT DETECTED Final   Candida krusei NOT DETECTED NOT DETECTED Final   Candida parapsilosis NOT DETECTED NOT DETECTED Final   Candida tropicalis NOT DETECTED NOT DETECTED Final   Cryptococcus neoformans/gattii NOT DETECTED NOT DETECTED Final   Methicillin resistance mecA/C DETECTED (A) NOT DETECTED Final    Comment: CRITICAL RESULT CALLED TO, READ BACK BY AND VERIFIED WITHBETHA LOISE MORONES RN 06/14/2024 @ 0414 BY AB Performed at [facility], [address], [place], [place] [address]   Urine Culture     Status: Abnormal   Collection Time: 06/13/24  1:20 PM   Specimen: Urine, Clean Catch  Result Value Ref Range Status   Specimen Description   Final    URINE, CLEAN CATCH Performed at [facility], [address], [place], [place] [address]    Special Requests   Final    NONE Performed at [facility], [address] [place], [place] [address]    Culture (A)  Final    <10,000 COLONIES/mL INSIGNIFICANT GROWTH Performed at  [facility], [address] [place], [place] [address]    Report Status 06/14/2024 FINAL  Final  Culture, blood (Routine X 2) w Reflex to ID Panel     Status: Abnormal   Collection Time:  06/14/24  7:44 AM   Specimen: BLOOD  Result Value Ref Range Status   Specimen Description   Final    BLOOD RIGHT ANTECUBITAL Performed at [facility], [address] [place], [place] [address]    Special Requests   Final    BOTTLES DRAWN AEROBIC AND ANAEROBIC Blood Culture adequate volume Performed at [facility], [address] [place], [place] [address]    Culture  Setup Time   Final    GRAM POSITIVE COCCI IN BOTH AEROBIC AND ANAEROBIC BOTTLES Gram Stain Report Called to,Read Back By and Verified With: DSABRA MOLT ON 06/15/2024 @12 :50 BY T.HAMER Performed at [facility], [address] [place], [place] [address]    Culture (A)  Final    STAPHYLOCOCCUS EPIDERMIDIS SUSCEPTIBILITIES PERFORMED ON PREVIOUS CULTURE WITHIN THE LAST 5 DAYS. Performed at [facility], [address], [place], [place] [address]    Report Status 06/17/2024 FINAL  Final  Culture, blood (Routine X 2) w Reflex to ID Panel     Status: Abnormal   Collection Time: 06/14/24  7:44 AM   Specimen: BLOOD  Result Value Ref Range Status   Specimen Description   Final    BLOOD LEFT ANTECUBITAL Performed at [facility], [address] [place], [place] [address]    Special Requests   Final    BOTTLES DRAWN AEROBIC AND ANAEROBIC Blood Culture adequate volume Performed at [facility], [address] [place], [place] [address]    Culture  Setup Time   Final    GRAM POSITIVE COCCI IN BOTH AEROBIC AND ANAEROBIC BOTTLES Gram Stain Report Called to,Read Back By and Verified With: B. ROBERTSON ON 06/15/2024 @13 :49 BY T.HAMER Performed at [facility], [address] [place], [place] [address]    Culture (A)  Final    STAPHYLOCOCCUS EPIDERMIDIS SUSCEPTIBILITIES PERFORMED ON PREVIOUS CULTURE WITHIN THE LAST 5 DAYS. Performed at [facility], [address] [place], [place] [address]    Report Status 06/17/2024 FINAL  Final  Culture, blood (Routine X 2) w Reflex to ID Panel     Status: None (Preliminary  result)   Collection Time: 06/16/24  2:30 AM   Specimen: BLOOD LEFT ARM  Result Value Ref Range Status   Specimen Description BLOOD LEFT ARM  Final   Special Requests AEROBIC BOTTLE ONLY Blood Culture adequate volume  Final   Culture   Final    NO GROWTH 3 DAYS Performed at [facility], [address], [place], [place] [address]    Report Status PENDING  Incomplete  Culture, blood (Routine X 2) w Reflex to ID Panel     Status: None (Preliminary result)   Collection Time: 06/16/24  2:35 AM   Specimen: Right Antecubital; Blood  Result Value Ref Range Status   Specimen Description RIGHT ANTECUBITAL  Final   Special Requests   Final    BOTTLES DRAWN AEROBIC AND ANAEROBIC Blood Culture adequate volume   Culture   Final    NO GROWTH 3 DAYS Performed at [facility], [address], [place], [place] [address]    Report Status PENDING  Incomplete    Studies/Results: CARDIAC CATHETERIZATION Result Date: 06/20/2024 Images from the original result were not included. Cardiac Catheterization  06/20/24: Hemodynamic data: Right heart catheterization RA 5/7, mean 5 mmHg RV 23/3, EDP 7 mmHg PA 23/9, mean 12 mmHg PW 12/11, mean 11 mmHg. PA saturation 72%, AO saturation 98%. QP/QS 1.0. CO 6.6, CI 3.88. Angiographic data: LM: Ostial 40 to 50% stenosis, mild calcification is evident. LAD: Large-caliber vessel, mild diffuse disease is evident.  There is competitive filling noted in the mid LAD. RI: Small vessel, minimal diffuse disease. LCx: Very large caliber vessel.  Gives origin to large OM1.  Minimal disease evident. RCA: Calcific ostium, mild coronary calcification is evident in the proximal to mid segment, mild luminal irregularity noted. LIMA to LAD: Widely patent. SVG to RI: Widely patent. Impression and recommendations: Normal right heart catheterization with reduced pulmonary wedge, fluid distention performed in the lab.  Encourage increased oral intake.  No change in coronary anatomy since prior cardiac  catheterization on 10/09/2023 with presence of new patent grafts.      Assessment/Plan:  INTERVAL HISTORY:  Patient is sp cardiac catheterization   Principal Problem:   Endocarditis of prosthetic aortic valve Active Problems:   Essential hypertension   Chest pain   Chronic heart failure with preserved ejection fraction (HFpEF) (HCC)   S/P CABG x 2   S/P AVR (aortic valve replacement)   BPH with obstruction/lower urinary tract symptoms   SIRS (systemic inflammatory response syndrome) (HCC)   Bacteremia   Acute embolic stroke (HCC)   Endocarditis of tricuspid valve   Congestive heart failure (HCC)   Prosthetic valve endocarditis   Acute stroke due to ischemia (HCC)    Kyle Matthews is a 78 y.o. 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  The culprit organism is methicillin resistant Staphylococcus epidermidis   #1 Prosthetic and native valve endocarditis:  I agree with Dr. Ygnacio that the patient needs cardiothoracic surgery in my understanding is not only with aortic valve replacement attentionally also the tricuspid valve.  Reiterated this to the patient and his wife who is at the bedside and they seem to understand completely.  Patient is now status post left and right cardiac cath.  He and his wife told me that cardiologist told him that there is no intervention planned preoperatively.  He is apparently scheduled for the surgery a week from this Friday  In the interim we will continue on vancomycin .  Maintain close eye on his levels as well as renal function for therapeutic drug monitoring.  I would like to do 6 weeks of effective postoperative antibiotics  Routine standard precautions  I have personally spent 50 minutes involved in face-to-face and non-face-to-face  activities for this patient on the day of the visit. Professional time spent includes the following activities: Preparing to see the patient (review of tests), Obtaining and/or reviewing separately obtained history (admission/discharge record), Performing a medically appropriate examination and/or evaluation , Ordering medications/tests/procedures, referring and communicating with other health care professionals, Documenting clinical information in the EMR, Independently interpreting results (not separately reported), Communicating results to the patient/family/caregiver, Counseling and educating the patient/family/caregiver and Care coordination (not separately reported).   Evaluation of the patient requires complex antimicrobial therapy evaluation, counseling , isolation needs to reduce disease transmission and risk assessment and mitigation.      LOS: 5 days   Jomarie Fleeta Rothman 06/20/2024, 4:04 PM

## 2024-06-20 NOTE — Progress Notes (Signed)
 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  Blood cultures positive for MRSE, MRI brain showed concerns of possible embolic stroke.  Echocardiogram negative for endocarditis but TEE showed large mobile density on tricuspid and prosthetic valve.  CT surgery and cardiology team consulted   Assessment & Plan:  Principal Problem:   Endocarditis of prosthetic aortic valve Active Problems:   Chronic heart failure with preserved ejection fraction (HFpEF) (HCC)   BPH with obstruction/lower urinary tract symptoms   Essential hypertension   Chest pain   S/P CABG x 2   S/P AVR (aortic valve replacement)   SIRS (systemic inflammatory response syndrome) (HCC)   Bacteremia   Acute embolic stroke (HCC)   Endocarditis of tricuspid valve   Congestive heart failure (HCC)   Prosthetic valve endocarditis   Acute stroke due to ischemia (HCC)   MRSE bacteremia 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  TEE shows concerns of endocarditis.  CT surgery, ID and cardiology team is following.  MRI is also positive for embolic CVA -Initial blood culture 6/27-positive for MRSE -Repeat blood cultures 6/29-NGTD - Preop cardiac cath plan - Eventually patient will require definitive operative management for his valvular endocarditis.  Acute CVA, likely embolic - MRI brain is positive for acute CVA.  Echocardiogram shows EF 50%, moderate TR.  LDL 66, A1c 5.1.  Neurology recommended aspirin .  History of BPH - Status post laparoscopic prostatectomy on 6/4,  Foley catheter was removed on 6/16.  Currently able to urinate.  Continue finasteride   Hyperlipidemia - Statin  Essential hypertension - IV as needed   DVT prophylaxis:     Code Status: Full Code Family Communication:   Status is: Inpatient Ongoing management for endocarditis.    Subjective:  No complaints.  Seen right before his left heart cath  Examination:  General exam: Appears calm and comfortable  Respiratory system: Clear to auscultation. Respiratory effort normal. Cardiovascular system: S1 & S2 heard, RRR. No JVD, murmurs, rubs, gallops or clicks. No pedal edema. Gastrointestinal system: Abdomen is nondistended, soft and nontender. No organomegaly or masses felt. Normal bowel sounds heard. Central nervous system: Alert and oriented. No focal neurological deficits. Extremities: Symmetric 5 x 5 power. Skin: No rashes, lesions or ulcers Psychiatry: Judgement and insight appear normal. Mood & affect appropriate.                Diet Orders (From admission, onward)     Start     Ordered   06/20/24 0001  Diet NPO time specified Except for: Sips with Meds  Diet effective midnight       Comments: NPO for solid foods after midnight, may have clear liquids until 5am, then NPO (this would be for inpatients and outpatients)  Question:  Except for  Answer:  Sips with Meds   06/19/24 1017            Objective: Vitals:   06/20/24 1047 06/20/24 1052 06/20/24 1057 06/20/24 1102  BP: (!) 147/70 (!) 156/64 (!) 141/70 (!) 153/66  Pulse: 78 76 74 72  Resp: 18 17 18  17  Temp:      TempSrc:      SpO2: 98% 99% 100% 100%  Weight:      Height:        Intake/Output Summary (Last 24 hours) at 06/20/2024 1117 Last data filed at 06/20/2024 0934 Gross per 24 hour  Intake 1665.18 ml  Output --  Net 1665.18 ml   Filed Weights   06/12/24 1657 06/12/24 2330 06/20/24 0616  Weight: 71 kg 69.5 kg 69 kg    Scheduled Meds:  [MAR Hold] aspirin   81 mg Oral Daily   [MAR  Hold] finasteride   5 mg Oral QPM   [MAR Hold] pantoprazole  (PROTONIX ) IV  40 mg Intravenous Q24H   [MAR Hold] rosuvastatin   40 mg Oral Daily   Continuous Infusions:  sodium chloride  1.439 mL/kg/hr (06/20/24 1018)   [MAR Hold] vancomycin  Stopped (06/19/24 2326)    Nutritional status     Body mass index is 27.82 kg/m.  Data Reviewed:   CBC: Recent Labs  Lab 06/14/24 0426 06/15/24 0306 06/16/24 0230 06/17/24 0312 06/20/24 0406  WBC 14.2* 11.3* 14.0* 11.5* 12.5*  HGB 10.5* 9.7* 11.3* 10.1* 9.7*  HCT 31.0* 30.2* 35.0* 29.8* 29.1*  MCV 90.9 91.2 91.9 90.0 90.7  PLT 207 188 229 204 189   Basic Metabolic Panel: Recent Labs  Lab 06/14/24 0426 06/15/24 0306 06/17/24 0312 06/18/24 0441 06/19/24 0429 06/20/24 0406  NA 132* 134* 134* 134* 134* 136  K 3.8 3.8 3.4* 3.3* 3.5 3.6  CL 99 100 102 102 103 103  CO2 24 26 22 24 24 24   GLUCOSE 114* 92 92 91 113* 104*  BUN 11 15 9 9 10 10   CREATININE 0.75 0.76 0.59* 0.62 0.65 0.91  CALCIUM  8.4* 8.3* 8.2* 8.3* 8.3* 8.6*  MG 1.9 2.0  --   --   --   --    GFR: Estimated Creatinine Clearance: 59.9 mL/min (by C-G formula based on SCr of 0.91 mg/dL). Liver Function Tests: No results for input(s): AST, ALT, ALKPHOS, BILITOT, PROT, ALBUMIN  in the last 168 hours. No results for input(s): LIPASE, AMYLASE in the last 168 hours. No results for input(s): AMMONIA in the last 168 hours. Coagulation Profile: No results for input(s): INR, PROTIME in the last 168 hours. Cardiac Enzymes: No results for input(s): CKTOTAL, CKMB, CKMBINDEX, TROPONINI in the last 168 hours. BNP (last 3 results) No results for input(s): PROBNP in the last 8760 hours. HbA1C: No results for input(s): HGBA1C in the last 72 hours. CBG: Recent Labs  Lab 06/18/24 2129  GLUCAP 122*   Lipid Profile: No results for input(s): CHOL, HDL, LDLCALC, TRIG, CHOLHDL, LDLDIRECT in the last 72 hours. Thyroid  Function Tests: No  results for input(s): TSH, T4TOTAL, FREET4, T3FREE, THYROIDAB in the last 72 hours. Anemia Panel: No results for input(s): VITAMINB12, FOLATE, FERRITIN, TIBC, IRON , RETICCTPCT in the last 72 hours. Sepsis Labs: No results for input(s): PROCALCITON, LATICACIDVEN in the last 168 hours.  Recent Results (from the past 240 hours)  Culture, blood (Routine X 2) w Reflex to ID Panel     Status: Abnormal   Collection Time: 06/12/24 10:55 PM   Specimen: BLOOD  Result Value Ref Range Status   Specimen Description   Final    BLOOD BLOOD LEFT ARM Performed at [facility], [address] [place], [place] [address]    Special Requests   Final    BOTTLES DRAWN AEROBIC AND ANAEROBIC Blood Culture adequate volume Performed at [facility], [address] [place], [place] [address]    Culture  Setup Time   Final    GRAM POSITIVE COCCI IN BOTH AEROBIC AND ANAEROBIC BOTTLES CRITICAL VALUE NOTED.  VALUE IS CONSISTENT WITH PREVIOUSLY REPORTED AND CALLED VALUE. JESSICA ELLER AT 2315 06/13/24 BY A. SNYDER GRAM STAIN REVIEWED-AGREE WITH RESULT    Culture (A)  Final    STAPHYLOCOCCUS EPIDERMIDIS SUSCEPTIBILITIES PERFORMED ON PREVIOUS CULTURE WITHIN THE LAST 5 DAYS. Performed at [facility], [address], [place], [place] [address]    Report Status 06/17/2024 FINAL  Final  Culture, blood (Routine X 2) w Reflex to ID Panel     Status: Abnormal   Collection Time: 06/12/24 10:57 PM   Specimen: BLOOD LEFT HAND  Result Value Ref Range Status   Specimen Description   Final    BLOOD LEFT HAND Performed at [facility], [address], [place], [place] [address]    Special Requests   Final    BOTTLES DRAWN AEROBIC AND ANAEROBIC Blood Culture adequate volume Performed at [facility], [address] [place], [place] [address]    Culture  Setup Time   Final    GRAM POSITIVE COCCI IN BOTH AEROBIC AND ANAEROBIC BOTTLES Gram Stain Report Called to,Read Back By and  Verified With: JESSICA ELLER,RN AT 2315 06/13/24 BY A. SNYDER CRITICAL RESULT CALLED TO, READ BACK BY AND VERIFIED WITH: LOISE MORONES RN 06/14/2024 @ 0414 BY AB GRAM STAIN REVIEWED-AGREE WITH RESULT Performed at [facility], [address] [place], [place] [address]    Culture STAPHYLOCOCCUS EPIDERMIDIS (A)  Final   Report Status 06/17/2024 FINAL  Final   Organism ID, Bacteria STAPHYLOCOCCUS EPIDERMIDIS  Final      Susceptibility   Staphylococcus epidermidis - MIC*    CIPROFLOXACIN  >=8 RESISTANT Resistant     ERYTHROMYCIN >=8 RESISTANT Resistant     GENTAMICIN <=0.5 SENSITIVE Sensitive     OXACILLIN >=4 RESISTANT Resistant     TETRACYCLINE >=16 RESISTANT Resistant     VANCOMYCIN  <=0.5 SENSITIVE Sensitive     TRIMETH/SULFA 160 RESISTANT Resistant     CLINDAMYCIN >=8 RESISTANT Resistant     RIFAMPIN <=0.5 SENSITIVE Sensitive     Inducible Clindamycin NEGATIVE Sensitive     * STAPHYLOCOCCUS EPIDERMIDIS  Blood Culture ID Panel (Reflexed)     Status: Abnormal   Collection Time: 06/12/24 10:57 PM  Result Value Ref Range Status   Enterococcus faecalis NOT DETECTED NOT DETECTED Final   Enterococcus Faecium NOT DETECTED NOT DETECTED Final   Listeria monocytogenes NOT DETECTED NOT DETECTED Final   Staphylococcus species DETECTED (A) NOT DETECTED Final    Comment: CRITICAL RESULT CALLED TO, READ BACK BY AND VERIFIED WITH: N BAUGH RN 06/14/2024 @ 0414 BY AB    Staphylococcus aureus (BCID) NOT DETECTED NOT DETECTED Final   Staphylococcus epidermidis DETECTED (A) NOT DETECTED Final    Comment: Methicillin (oxacillin) resistant coagulase negative staphylococcus. Possible blood culture contaminant (unless isolated from more than one blood culture draw or clinical case suggests pathogenicity). No antibiotic treatment is indicated for blood  culture contaminants. CRITICAL RESULT CALLED TO, READ BACK BY AND VERIFIED WITH: N BAUGH RN 06/14/2024 @ 0414 BY AB    Staphylococcus lugdunensis NOT  DETECTED NOT DETECTED Final   Streptococcus species NOT DETECTED NOT DETECTED Final   Streptococcus agalactiae NOT DETECTED NOT DETECTED Final   Streptococcus pneumoniae NOT DETECTED NOT DETECTED Final   Streptococcus pyogenes NOT DETECTED NOT DETECTED Final   A.calcoaceticus-baumannii NOT  DETECTED NOT DETECTED Final   Bacteroides fragilis NOT DETECTED NOT DETECTED Final   Enterobacterales NOT DETECTED NOT DETECTED Final   Enterobacter cloacae complex NOT DETECTED NOT DETECTED Final   Escherichia coli NOT DETECTED NOT DETECTED Final   Klebsiella aerogenes NOT DETECTED NOT DETECTED Final   Klebsiella oxytoca NOT DETECTED NOT DETECTED Final   Klebsiella pneumoniae NOT DETECTED NOT DETECTED Final   Proteus species NOT DETECTED NOT DETECTED Final   Salmonella species NOT DETECTED NOT DETECTED Final   Serratia marcescens NOT DETECTED NOT DETECTED Final   Haemophilus influenzae NOT DETECTED NOT DETECTED Final   Neisseria meningitidis NOT DETECTED NOT DETECTED Final   Pseudomonas aeruginosa NOT DETECTED NOT DETECTED Final   Stenotrophomonas maltophilia NOT DETECTED NOT DETECTED Final   Candida albicans NOT DETECTED NOT DETECTED Final   Candida auris NOT DETECTED NOT DETECTED Final   Candida glabrata NOT DETECTED NOT DETECTED Final   Candida krusei NOT DETECTED NOT DETECTED Final   Candida parapsilosis NOT DETECTED NOT DETECTED Final   Candida tropicalis NOT DETECTED NOT DETECTED Final   Cryptococcus neoformans/gattii NOT DETECTED NOT DETECTED Final   Methicillin resistance mecA/C DETECTED (A) NOT DETECTED Final    Comment: CRITICAL RESULT CALLED TO, READ BACK BY AND VERIFIED WITHBETHA LOISE MORONES RN 06/14/2024 @ 0414 BY AB Performed at [facility], [address] [place], [place] [address]   Urine Culture     Status: Abnormal   Collection Time: 06/13/24  1:20 PM   Specimen: Urine, Clean Catch  Result Value Ref Range Status   Specimen Description   Final    URINE, CLEAN CATCH Performed  at [facility], [address], [place], [place] [address]    Special Requests   Final    NONE Performed at [facility], [address] [place], [place] [address]    Culture (A)  Final    <10,000 COLONIES/mL INSIGNIFICANT GROWTH Performed at [facility], [address] [place], [place] [address]    Report Status 06/14/2024 FINAL  Final  Culture, blood (Routine X 2) w Reflex to ID Panel     Status: Abnormal   Collection Time: 06/14/24  7:44 AM   Specimen: BLOOD  Result Value Ref Range Status   Specimen Description   Final    BLOOD RIGHT ANTECUBITAL Performed at [facility], [address], [place], [place] [address]    Special Requests   Final    BOTTLES DRAWN AEROBIC AND ANAEROBIC Blood Culture adequate volume Performed at [facility], [address] [place], [place] [address]    Culture  Setup Time   Final    GRAM POSITIVE COCCI IN BOTH AEROBIC AND ANAEROBIC BOTTLES Gram Stain Report Called to,Read Back By and Verified With: DSABRA MOLT ON 06/15/2024 @12 :50 BY T.HAMER Performed at [facility], [address], [place], [place] [address]    Culture (A)  Final    STAPHYLOCOCCUS EPIDERMIDIS SUSCEPTIBILITIES PERFORMED ON PREVIOUS CULTURE WITHIN THE LAST 5 DAYS. Performed at [facility], [address] [place], [place] [address]    Report Status 06/17/2024 FINAL  Final  Culture, blood (Routine X 2) w Reflex to ID Panel     Status: Abnormal   Collection Time: 06/14/24  7:44 AM   Specimen: BLOOD  Result Value Ref Range Status   Specimen Description   Final    BLOOD LEFT ANTECUBITAL Performed at [facility], [address], [place], [place] [address]    Special Requests  Final    BOTTLES DRAWN AEROBIC AND ANAEROBIC Blood Culture adequate volume Performed at [facility], [address], [place], [place] [address]    Culture  Setup Time   Final    GRAM POSITIVE COCCI IN BOTH AEROBIC AND ANAEROBIC BOTTLES Gram Stain Report Called to,Read Back By and Verified  With: B. ROBERTSON ON 06/15/2024 @13 :49 BY T.HAMER Performed at [facility], [address] [place], [place] [address]    Culture (A)  Final    STAPHYLOCOCCUS EPIDERMIDIS SUSCEPTIBILITIES PERFORMED ON PREVIOUS CULTURE WITHIN THE LAST 5 DAYS. Performed at [facility], [address], [place], [place] [address]    Report Status 06/17/2024 FINAL  Final  Culture, blood (Routine X 2) w Reflex to ID Panel     Status: None (Preliminary result)   Collection Time: 06/16/24  2:30 AM   Specimen: BLOOD LEFT ARM  Result Value Ref Range Status   Specimen Description BLOOD LEFT ARM  Final   Special Requests AEROBIC BOTTLE ONLY Blood Culture adequate volume  Final   Culture   Final    NO GROWTH 3 DAYS Performed at [facility], [address], [place], [place] [address]    Report Status PENDING  Incomplete  Culture, blood (Routine X 2) w Reflex to ID Panel     Status: None (Preliminary result)   Collection Time: 06/16/24  2:35 AM   Specimen: Right Antecubital; Blood  Result Value Ref Range Status   Specimen Description RIGHT ANTECUBITAL  Final   Special Requests   Final    BOTTLES DRAWN AEROBIC AND ANAEROBIC Blood Culture adequate volume   Culture   Final    NO GROWTH 3 DAYS Performed at [facility], [address], [place], [place] [address]    Report Status PENDING  Incomplete         Radiology Studies: No results found.         LOS: 5 days   Time spent= 35 mins    Burgess JAYSON Dare, MD [facility]  If 7PM-7AM, please contact night-coverage  06/20/2024, 11:17 AM

## 2024-06-20 NOTE — Interval H&P Note (Signed)
 History and Physical Interval Note:  06/20/2024 9:57 AM  Kyle Matthews  has presented today for surgery, with the diagnosis of pre op.  The various methods of treatment have been discussed with the patient and family. After consideration of risks, benefits and other options for treatment, the patient has consented to  Procedure(s): RIGHT/LEFT HEART CATH AND CORONARY/GRAFT ANGIOGRAPHY (N/A) as a surgical intervention.  The patient's history has been reviewed, patient examined, no change in status, stable for surgery.  I have reviewed the patient's chart and labs.  Questions were answered to the patient's satisfaction.     Gordy Bergamo

## 2024-06-21 DIAGNOSIS — T826XXA Infection and inflammatory reaction due to cardiac valve prosthesis, initial encounter: Secondary | ICD-10-CM | POA: Diagnosis not present

## 2024-06-21 DIAGNOSIS — Z951 Presence of aortocoronary bypass graft: Secondary | ICD-10-CM | POA: Diagnosis not present

## 2024-06-21 DIAGNOSIS — R7881 Bacteremia: Secondary | ICD-10-CM | POA: Diagnosis not present

## 2024-06-21 DIAGNOSIS — I330 Acute and subacute infective endocarditis: Secondary | ICD-10-CM | POA: Diagnosis not present

## 2024-06-21 LAB — CULTURE, BLOOD (ROUTINE X 2)
Culture: NO GROWTH
Culture: NO GROWTH
Special Requests: ADEQUATE
Special Requests: ADEQUATE

## 2024-06-21 LAB — CREATININE, SERUM
Creatinine, Ser: 0.9 mg/dL (ref 0.61–1.24)
GFR, Estimated: >60 mL/min (ref >60)

## 2024-06-21 MED ORDER — FAMOTIDINE 20 MG PO TABS
20.0000 mg | ORAL_TABLET | Freq: Two times a day (BID) | ORAL | Status: AC
  Administered 2024-06-21 – 2024-06-24 (×8): 20 mg via ORAL
  Filled 2024-06-21 (×8): qty 1

## 2024-06-21 MED ORDER — DIPHENHYDRAMINE HCL 25 MG PO CAPS
25.0000 mg | ORAL_CAPSULE | Freq: Four times a day (QID) | ORAL | Status: DC | PRN
  Administered 2024-06-21 – 2024-06-25 (×7): 25 mg via ORAL
  Filled 2024-06-21 (×7): qty 1

## 2024-06-21 NOTE — Progress Notes (Addendum)
 Subjective: No new complaints    Antibiotics:  Anti-infectives (From admission, onward)    Start     Dose/Rate Route Frequency Ordered Stop   06/19/24 2200  vancomycin  (VANCOREADY) IVPB 1500 mg/300 mL        1,500 mg 150 mL/hr over 120 Minutes Intravenous Every 12 hours 06/19/24 1246     06/18/24 0000  vancomycin  (VANCOCIN ) IVPB 1000 mg/200 mL premix  Status:  Discontinued        1,000 mg 200 mL/hr over 60 Minutes Intravenous Every 12 hours 06/17/24 1115 06/19/24 1246   06/15/24 1000  vancomycin  (VANCOREADY) IVPB 1250 mg/250 mL  Status:  Discontinued        1,250 mg 166.7 mL/hr over 90 Minutes Intravenous Every 24 hours 06/14/24 1029 06/17/24 1115   06/14/24 0845  vancomycin  (VANCOREADY) IVPB 1500 mg/300 mL        1,500 mg 150 mL/hr over 120 Minutes Intravenous  Once 06/14/24 0747 06/14/24 1133   06/13/24 0000  ciprofloxacin  (CIPRO ) tablet 500 mg  Status:  Discontinued        500 mg Oral 2 times daily 06/12/24 2311 06/13/24 0854       Medications: Scheduled Meds:  aspirin   81 mg Oral Daily   famotidine   20 mg Oral BID   finasteride   5 mg Oral QPM   pantoprazole  (PROTONIX ) IV  40 mg Intravenous Q24H   rosuvastatin   40 mg Oral Daily   Continuous Infusions:  vancomycin  1,500 mg (06/21/24 1040)   PRN Meds:.acetaminophen  **OR** acetaminophen , diphenhydrAMINE , diphenhydrAMINE -zinc  acetate, furosemide , ondansetron  **OR** ondansetron  (ZOFRAN ) IV, polyethylene glycol, sodium chloride  flush    Objective: Weight change:   Intake/Output Summary (Last 24 hours) at 06/21/2024 1308 Last data filed at 06/21/2024 1159 Gross per 24 hour  Intake 1020 ml  Output --  Net 1020 ml   Blood pressure 123/63, pulse 71, temperature 98 F (36.7 C), temperature source Oral, resp. rate 18, height 5' 2 (1.575 m), weight 69 kg, SpO2 99%. Temp:  [98 F (36.7 C)-98.7 F (37.1 C)] 98 F (36.7 C) (07/04 1200) Pulse Rate:  [71-82] 71 (07/04 1200) Resp:  [17-18] 18 (07/04 1200) BP:  (103-149)/(57-128) 123/63 (07/04 1200) SpO2:  [94 %-100 %] 99 % (07/04 1200)  Physical Exam: Physical Exam Constitutional:      Appearance: He is well-developed.  HENT:     Head: Normocephalic and atraumatic.  Eyes:     Conjunctiva/sclera: Conjunctivae normal.  Cardiovascular:     Rate and Rhythm: Normal rate and regular rhythm.     Heart sounds: Murmur heard.     Friction rub present. No gallop.  Pulmonary:     Effort: Pulmonary effort is normal. No respiratory distress.     Breath sounds: Normal breath sounds. No stridor. No wheezing.  Abdominal:     General: There is no distension.     Palpations: Abdomen is soft.  Musculoskeletal:        General: Normal range of motion.     Cervical back: Normal range of motion and neck supple.  Skin:    General: Skin is warm and dry.     Findings: No erythema or rash.  Neurological:     General: No focal deficit present.     Mental Status: He is alert and oriented to person, place, and time.  Psychiatric:        Mood and Affect: Mood normal.        Behavior: Behavior normal.  Thought Content: Thought content normal.        Judgment: Judgment normal.      CBC:    BMET Recent Labs    06/19/24 0429 06/20/24 0406 06/20/24 1018 06/20/24 1023 06/21/24 0424  NA 134* 136 137 139  --   K 3.5 3.6 3.8 3.4*  --   CL 103 103  --   --   --   CO2 24 24  --   --   --   GLUCOSE 113* 104*  --   --   --   BUN 10 10  --   --   --   CREATININE 0.65 0.91  --   --  0.90  CALCIUM  8.3* 8.6*  --   --   --      Liver Panel  No results for input(s): PROT, ALBUMIN , AST, ALT, ALKPHOS, BILITOT, BILIDIR, IBILI in the last 72 hours.     Sedimentation Rate No results for input(s): ESRSEDRATE in the last 72 hours. C-Reactive Protein No results for input(s): CRP in the last 72 hours.  Micro Results: Recent Results (from the past 720 hours)  Culture, blood (Routine X 2) w Reflex to ID Panel     Status: Abnormal    Collection Time: 06/12/24 10:55 PM   Specimen: BLOOD  Result Value Ref Range Status   Specimen Description   Final    BLOOD BLOOD LEFT ARM Performed at [facility], [address] [place], [place] [address]    Special Requests   Final    BOTTLES DRAWN AEROBIC AND ANAEROBIC Blood Culture adequate volume Performed at [facility], [address] [place], [place] [address]    Culture  Setup Time   Final    GRAM POSITIVE COCCI IN BOTH AEROBIC AND ANAEROBIC BOTTLES CRITICAL VALUE NOTED.  VALUE IS CONSISTENT WITH PREVIOUSLY REPORTED AND CALLED VALUE. JESSICA ELLER AT 2315 06/13/24 BY A. SNYDER GRAM STAIN REVIEWED-AGREE WITH RESULT    Culture (A)  Final    STAPHYLOCOCCUS EPIDERMIDIS SUSCEPTIBILITIES PERFORMED ON PREVIOUS CULTURE WITHIN THE LAST 5 DAYS. Performed at [facility], [address], [place], [place] [address]    Report Status 06/17/2024 FINAL  Final  Culture, blood (Routine X 2) w Reflex to ID Panel     Status: Abnormal   Collection Time: 06/12/24 10:57 PM   Specimen: BLOOD LEFT HAND  Result Value Ref Range Status   Specimen Description   Final    BLOOD LEFT HAND Performed at [facility], [address] [place], [place] [address]    Special Requests   Final    BOTTLES DRAWN AEROBIC AND ANAEROBIC Blood Culture adequate volume Performed at [facility], [address] [place], [place] [address]    Culture  Setup Time   Final    GRAM POSITIVE COCCI IN BOTH AEROBIC AND ANAEROBIC BOTTLES Gram Stain Report Called to,Read Back By and Verified With: JESSICA ELLER,RN AT 2315 06/13/24 BY A. SNYDER CRITICAL RESULT CALLED TO, READ BACK BY AND VERIFIED WITH: LOISE MORONES RN 06/14/2024 @ 0414 BY AB GRAM STAIN REVIEWED-AGREE WITH RESULT Performed at [facility], [address] [place], [place] [address]    Culture STAPHYLOCOCCUS EPIDERMIDIS (A)  Final   Report Status 06/17/2024 FINAL  Final   Organism ID, Bacteria STAPHYLOCOCCUS EPIDERMIDIS  Final       Susceptibility   Staphylococcus epidermidis - MIC*    CIPROFLOXACIN  >=8 RESISTANT Resistant     ERYTHROMYCIN >=8  RESISTANT Resistant     GENTAMICIN <=0.5 SENSITIVE Sensitive     OXACILLIN >=4 RESISTANT Resistant     TETRACYCLINE >=16 RESISTANT Resistant     VANCOMYCIN  <=0.5 SENSITIVE Sensitive     TRIMETH/SULFA 160 RESISTANT Resistant     CLINDAMYCIN >=8 RESISTANT Resistant     RIFAMPIN <=0.5 SENSITIVE Sensitive     Inducible Clindamycin NEGATIVE Sensitive     * STAPHYLOCOCCUS EPIDERMIDIS  Blood Culture ID Panel (Reflexed)     Status: Abnormal   Collection Time: 06/12/24 10:57 PM  Result Value Ref Range Status   Enterococcus faecalis NOT DETECTED NOT DETECTED Final   Enterococcus Faecium NOT DETECTED NOT DETECTED Final   Listeria monocytogenes NOT DETECTED NOT DETECTED Final   Staphylococcus species DETECTED (A) NOT DETECTED Final    Comment: CRITICAL RESULT CALLED TO, READ BACK BY AND VERIFIED WITH: N BAUGH RN 06/14/2024 @ 0414 BY AB    Staphylococcus aureus (BCID) NOT DETECTED NOT DETECTED Final   Staphylococcus epidermidis DETECTED (A) NOT DETECTED Final    Comment: Methicillin (oxacillin) resistant coagulase negative staphylococcus. Possible blood culture contaminant (unless isolated from more than one blood culture draw or clinical case suggests pathogenicity). No antibiotic treatment is indicated for blood  culture contaminants. CRITICAL RESULT CALLED TO, READ BACK BY AND VERIFIED WITH: N BAUGH RN 06/14/2024 @ 0414 BY AB    Staphylococcus lugdunensis NOT DETECTED NOT DETECTED Final   Streptococcus species NOT DETECTED NOT DETECTED Final   Streptococcus agalactiae NOT DETECTED NOT DETECTED Final   Streptococcus pneumoniae NOT DETECTED NOT DETECTED Final   Streptococcus pyogenes NOT DETECTED NOT DETECTED Final   A.calcoaceticus-baumannii NOT DETECTED NOT DETECTED Final   Bacteroides fragilis NOT DETECTED NOT DETECTED Final   Enterobacterales NOT DETECTED NOT DETECTED Final    Enterobacter cloacae complex NOT DETECTED NOT DETECTED Final   Escherichia coli NOT DETECTED NOT DETECTED Final   Klebsiella aerogenes NOT DETECTED NOT DETECTED Final   Klebsiella oxytoca NOT DETECTED NOT DETECTED Final   Klebsiella pneumoniae NOT DETECTED NOT DETECTED Final   Proteus species NOT DETECTED NOT DETECTED Final   Salmonella species NOT DETECTED NOT DETECTED Final   Serratia marcescens NOT DETECTED NOT DETECTED Final   Haemophilus influenzae NOT DETECTED NOT DETECTED Final   Neisseria meningitidis NOT DETECTED NOT DETECTED Final   Pseudomonas aeruginosa NOT DETECTED NOT DETECTED Final   Stenotrophomonas maltophilia NOT DETECTED NOT DETECTED Final   Candida albicans NOT DETECTED NOT DETECTED Final   Candida auris NOT DETECTED NOT DETECTED Final   Candida glabrata NOT DETECTED NOT DETECTED Final   Candida krusei NOT DETECTED NOT DETECTED Final   Candida parapsilosis NOT DETECTED NOT DETECTED Final   Candida tropicalis NOT DETECTED NOT DETECTED Final   Cryptococcus neoformans/gattii NOT DETECTED NOT DETECTED Final   Methicillin resistance mecA/C DETECTED (A) NOT DETECTED Final    Comment: CRITICAL RESULT CALLED TO, READ BACK BY AND VERIFIED WITHBETHA LOISE MORONES RN 06/14/2024 @ 0414 BY AB Performed at [facility], [address] [place], [place] [address]   Urine Culture     Status: Abnormal   Collection Time: 06/13/24  1:20 PM   Specimen: Urine, Clean Catch  Result Value Ref Range Status   Specimen Description   Final    URINE, CLEAN CATCH Performed at [facility], [address], [place], [place] [address]    Special Requests   Final    NONE Performed at [facility], [address], [place], [place] [address]  Culture (A)  Final    <10,000 COLONIES/mL INSIGNIFICANT GROWTH Performed at [facility], [address] [place], [place] [address]    Report Status 06/14/2024 FINAL  Final  Culture, blood (Routine X 2) w Reflex to ID Panel     Status: Abnormal    Collection Time: 06/14/24  7:44 AM   Specimen: BLOOD  Result Value Ref Range Status   Specimen Description   Final    BLOOD RIGHT ANTECUBITAL Performed at [facility], [address], [place], [place] [address]    Special Requests   Final    BOTTLES DRAWN AEROBIC AND ANAEROBIC Blood Culture adequate volume Performed at [facility], [address], [place], [place] [address]    Culture  Setup Time   Final    GRAM POSITIVE COCCI IN BOTH AEROBIC AND ANAEROBIC BOTTLES Gram Stain Report Called to,Read Back By and Verified With: DSABRA MOLT ON 06/15/2024 @12 :50 BY T.HAMER Performed at [facility], [address], [place], [place] [address]    Culture (A)  Final    STAPHYLOCOCCUS EPIDERMIDIS SUSCEPTIBILITIES PERFORMED ON PREVIOUS CULTURE WITHIN THE LAST 5 DAYS. Performed at [facility], [address], [place], [place] [address]    Report Status 06/17/2024 FINAL  Final  Culture, blood (Routine X 2) w Reflex to ID Panel     Status: Abnormal   Collection Time: 06/14/24  7:44 AM   Specimen: BLOOD  Result Value Ref Range Status   Specimen Description   Final    BLOOD LEFT ANTECUBITAL Performed at [facility] [address] [place], [place] [address]    Special Requests   Final    BOTTLES DRAWN AEROBIC AND ANAEROBIC Blood Culture adequate volume Performed at [facility], [address], [place], [place] [address]    Culture  Setup Time   Final    GRAM POSITIVE COCCI IN BOTH AEROBIC AND ANAEROBIC BOTTLES Gram Stain Report Called to,Read Back By and Verified With: B. ROBERTSON ON 06/15/2024 @13 :49 BY T.HAMER Performed at [facility], [address], [place], [place] [address]    Culture (A)  Final    STAPHYLOCOCCUS EPIDERMIDIS SUSCEPTIBILITIES PERFORMED ON PREVIOUS CULTURE WITHIN THE LAST 5 DAYS. Performed at [facility], [address] [place], [place] [address]    Report Status 06/17/2024 FINAL  Final  Culture, blood (Routine X 2) w Reflex to ID Panel     Status: None    Collection Time: 06/16/24  2:30 AM   Specimen: BLOOD LEFT ARM  Result Value Ref Range Status   Specimen Description BLOOD LEFT ARM  Final   Special Requests AEROBIC BOTTLE ONLY Blood Culture adequate volume  Final   Culture   Final    NO GROWTH 5 DAYS Performed at [facility], [address] [place], [place] [address]    Report Status 06/21/2024 FINAL  Final  Culture, blood (Routine X 2) w Reflex to ID Panel     Status: None   Collection Time: 06/16/24  2:35 AM   Specimen: Right Antecubital; Blood  Result Value Ref Range Status   Specimen Description RIGHT ANTECUBITAL  Final   Special Requests   Final    BOTTLES DRAWN AEROBIC AND ANAEROBIC Blood Culture adequate volume   Culture   Final    NO GROWTH 5 DAYS Performed at [facility], [address], [place], [place] [address]    Report Status 06/21/2024 FINAL  Final    Studies/Results: CARDIAC CATHETERIZATION Result Date:  06/20/2024 Images from the original result were not included. Cardiac Catheterization 06/20/24: Hemodynamic data: Right heart catheterization RA 5/7, mean 5 mmHg RV 23/3, EDP 7 mmHg PA 23/9, mean 12 mmHg PW 12/11, mean 11 mmHg. PA saturation 72%, AO saturation 98%. QP/QS 1.0. CO 6.6, CI 3.88. Angiographic data: LM: Ostial 40 to 50% stenosis, mild calcification is evident. LAD: Large-caliber vessel, mild diffuse disease is evident.  There is competitive filling noted in the mid LAD. RI: Small vessel, minimal diffuse disease. LCx: Very large caliber vessel.  Gives origin to large OM1.  Minimal disease evident. RCA: Calcific ostium, mild coronary calcification is evident in the proximal to mid segment, mild luminal irregularity noted. LIMA to LAD: Widely patent. SVG to RI: Widely patent. Impression and recommendations: Normal right heart catheterization with reduced pulmonary wedge, fluid distention performed in the lab.  Encourage increased oral intake.  No change in coronary anatomy since prior cardiac catheterization on  10/09/2023 with presence of new patent grafts.      Assessment/Plan:  INTERVAL HISTORY: Blood cultures taken on the 29th are without growth and final  Principal Problem:   Endocarditis of prosthetic aortic valve Active Problems:   Essential hypertension   Chest pain   Chronic heart failure with preserved ejection fraction (HFpEF) (HCC)   S/P CABG x 2   S/P AVR (aortic valve replacement)   BPH with obstruction/lower urinary tract symptoms   SIRS (systemic inflammatory response syndrome) (HCC)   Bacteremia   Acute embolic stroke (HCC)   Endocarditis of tricuspid valve   Congestive heart failure (HCC)   Prosthetic valve endocarditis   Acute stroke due to ischemia (HCC)    Kyle Matthews is a 78 y.o. 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  The culprit organism is methicillin resistant Staphylococcus epidermidis   #1 Prosthetic and native valve endocarditis:  We will continue with IV vancomycin   Repeat blood blood cultures have been without growth x 5 days  My preference if possible would be to delay any central lines until close to his surgery unless he absolutely needs one before then  Plan on giving him 6 weeks of postoperative vancomycin  or at least of anti MRSA drug, daptomycin could also be used though with his septic emboli to CNS I would prefer CNS penetrating antibiotic (vancomycin ) to start with   #2 TDM: Monitor renal function and vancomycin   #3  Standard precautions  I have personally spent 54 minutes involved in face-to-face and non-face-to-face activities for this patient on the day of the visit. Professional time spent includes the following activities: Preparing to see the patient (review of tests), Obtaining and/or reviewing separately obtained history  (admission/discharge record), Performing a medically appropriate examination and/or evaluation , Ordering medications/tests/procedures, referring and communicating with other health care professionals, Documenting clinical information in the EMR, Independently interpreting results (not separately reported), Communicating results to the patient/family/caregiver, Counseling and educating the patient/family/caregiver and Care coordination (not separately reported).    Evaluation of the patient requires complex antimicrobial therapy evaluation, counseling , isolation needs to reduce disease transmission and risk assessment and mitigation.   Dr. Eben is covering this weekend and available for questions and I will be back on Monday.   LOS: 6 days   Kyle Matthews 06/21/2024, 1:08 PM

## 2024-06-21 NOTE — Progress Notes (Signed)
 PROGRESS NOTE    Kyle Matthews  FMW:[id number] DOB: 06-16-1946 DOA: 06/12/2024 PCP: Verena Mems, MD    Brief Narrative:  78 year old with history of CAD status post CABG 10/2023, AAS status post bioprosthetic AVR, diastolic CHF, CVA, postop A-fib, HTN, HLD, BPH with urinary retention requiring robotic simple prostatectomy 6//25, HTN, HLD presented with chest pain and rigors on 6/25.  Blood cultures positive for MRSE, MRI brain showed concerns of possible embolic stroke.  Echocardiogram negative for endocarditis but TEE showed large mobile density on tricuspid and prosthetic valve.  CT surgery and cardiology team consulted. LHC does not show any new coronary disease, grafts are patent.  CT surgery planning intervention next week   Assessment & Plan:  Principal Problem:   Endocarditis of prosthetic aortic valve Active Problems:   Chronic heart failure with preserved ejection fraction (HFpEF) (HCC)   BPH with obstruction/lower urinary tract symptoms   Essential hypertension   Chest pain   S/P CABG x 2   S/P AVR (aortic valve replacement)   SIRS (systemic inflammatory response syndrome) (HCC)   Bacteremia   Acute embolic stroke (HCC)   Endocarditis of tricuspid valve   Congestive heart failure (HCC)   Prosthetic valve endocarditis   Acute stroke due to ischemia (HCC)   MRSE bacteremia 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  TEE shows concerns of endocarditis.  CT surgery, ID and cardiology team is following.  MRI is also positive for embolic CVA -Initial blood culture 6/27-positive for MRSE -Repeat blood cultures 6/29-NGTD - LHC does not show any new coronary disease, grafts are patent.  CT surgery planning intervention next week  Acute CVA, likely embolic - MRI brain is positive for acute CVA.  Echocardiogram shows EF 50%, moderate TR.  LDL 66, A1c 5.1.   Neurology recommended aspirin .  History of BPH - Status post laparoscopic prostatectomy on 6/4, Foley catheter was removed on 6/16.  Currently able to urinate.  Continue finasteride   Hyperlipidemia - Statin  Essential hypertension - IV as needed   DVT prophylaxis:     Code Status: Full Code Family Communication:   Status is: Inpatient Ongoing management for endocarditis.    Subjective: No complaints besides facial redness that started a few days ago   Examination:  General exam: Appears calm and comfortable  Respiratory system: Clear to auscultation. Respiratory effort normal. Cardiovascular system: S1 & S2 heard, RRR. No JVD, murmurs, rubs, gallops or clicks. No pedal edema. Gastrointestinal system: Abdomen is nondistended, soft and nontender. No organomegaly or masses felt. Normal bowel sounds heard. Central nervous system: Alert and oriented. No focal neurological deficits. Extremities: Symmetric 5 x 5 power. Skin: No specific facial rash, pruritus Psychiatry: Judgement and insight appear normal. Mood & affect appropriate.                Diet Orders (From admission, onward)     Start     Ordered   06/20/24 1237  Diet Heart Room service appropriate? Yes; Fluid consistency: Thin  Diet effective now       Question Answer Comment  Room service appropriate? Yes   Fluid consistency: Thin      06/20/24 1236            Objective: Vitals:   06/20/24 1948 06/21/24 0029 06/21/24 0416 06/21/24 0844  BP: 121/71 113/70 (!) 145/94 (!) 116/57  Pulse: 73 74 79 76  Resp: 18  18 18 18   Temp: 98.5 F (36.9 C) 98.3 F (36.8 C) 98.1 F (36.7 C) 98.2 F (36.8 C)  TempSrc: Oral Oral Oral Oral  SpO2: 99% 99% 98% 94%  Weight:      Height:        Intake/Output Summary (Last 24 hours) at 06/21/2024 1119 Last data filed at 06/21/2024 0842 Gross per 24 hour  Intake 720 ml  Output --  Net 720 ml   Filed Weights   06/12/24 1657 06/12/24 2330 06/20/24 0616   Weight: 71 kg 69.5 kg 69 kg    Scheduled Meds:  aspirin   81 mg Oral Daily   finasteride   5 mg Oral QPM   pantoprazole  (PROTONIX ) IV  40 mg Intravenous Q24H   rosuvastatin   40 mg Oral Daily   Continuous Infusions:  sodium chloride      vancomycin  1,500 mg (06/21/24 1040)    Nutritional status     Body mass index is 27.82 kg/m.  Data Reviewed:   CBC: Recent Labs  Lab 06/15/24 0306 06/16/24 0230 06/17/24 0312 06/20/24 0406 06/20/24 1018 06/20/24 1023  WBC 11.3* 14.0* 11.5* 12.5*  --   --   HGB 9.7* 11.3* 10.1* 9.7* 9.2* 8.8*  HCT 30.2* 35.0* 29.8* 29.1* 27.0* 26.0*  MCV 91.2 91.9 90.0 90.7  --   --   PLT 188 229 204 189  --   --    Basic Metabolic Panel: Recent Labs  Lab 06/15/24 0306 06/17/24 0312 06/18/24 0441 06/19/24 0429 06/20/24 0406 06/20/24 1018 06/20/24 1023 06/21/24 0424  NA 134* 134* 134* 134* 136 137 139  --   K 3.8 3.4* 3.3* 3.5 3.6 3.8 3.4*  --   CL 100 102 102 103 103  --   --   --   CO2 26 22 24 24 24   --   --   --   GLUCOSE 92 92 91 113* 104*  --   --   --   BUN 15 9 9 10 10   --   --   --   CREATININE 0.76 0.59* 0.62 0.65 0.91  --   --  0.90  CALCIUM  8.3* 8.2* 8.3* 8.3* 8.6*  --   --   --   MG 2.0  --   --   --   --   --   --   --    GFR: Estimated Creatinine Clearance: 60.6 mL/min (by C-G formula based on SCr of 0.9 mg/dL). Liver Function Tests: No results for input(s): AST, ALT, ALKPHOS, BILITOT, PROT, ALBUMIN  in the last 168 hours. No results for input(s): LIPASE, AMYLASE in the last 168 hours. No results for input(s): AMMONIA in the last 168 hours. Coagulation Profile: No results for input(s): INR, PROTIME in the last 168 hours. Cardiac Enzymes: No results for input(s): CKTOTAL, CKMB, CKMBINDEX, TROPONINI in the last 168 hours. BNP (last 3 results) No results for input(s): PROBNP in the last 8760 hours. HbA1C: No results for input(s): HGBA1C in the last 72 hours. CBG: Recent Labs  Lab  06/18/24 2129  GLUCAP 122*   Lipid Profile: No results for input(s): CHOL, HDL, LDLCALC, TRIG, CHOLHDL, LDLDIRECT in the last 72 hours. Thyroid  Function Tests: No results for input(s): TSH, T4TOTAL, FREET4, T3FREE, THYROIDAB in the last 72 hours. Anemia Panel: No results for input(s): VITAMINB12, FOLATE, FERRITIN, TIBC, IRON , RETICCTPCT in the last 72 hours. Sepsis Labs: No results for input(s): PROCALCITON, LATICACIDVEN in the last 168 hours.  Recent Results (from the past  240 hours)  Culture, blood (Routine X 2) w Reflex to ID Panel     Status: Abnormal   Collection Time: 06/12/24 10:55 PM   Specimen: BLOOD  Result Value Ref Range Status   Specimen Description   Final    BLOOD BLOOD LEFT ARM Performed at [facility], [address] [place], [place] [address]    Special Requests   Final    BOTTLES DRAWN AEROBIC AND ANAEROBIC Blood Culture adequate volume Performed at [facility], [address] [place], [place] [address]    Culture  Setup Time   Final    GRAM POSITIVE COCCI IN BOTH AEROBIC AND ANAEROBIC BOTTLES CRITICAL VALUE NOTED.  VALUE IS CONSISTENT WITH PREVIOUSLY REPORTED AND CALLED VALUE. JESSICA ELLER AT 2315 06/13/24 BY A. SNYDER GRAM STAIN REVIEWED-AGREE WITH RESULT    Culture (A)  Final    STAPHYLOCOCCUS EPIDERMIDIS SUSCEPTIBILITIES PERFORMED ON PREVIOUS CULTURE WITHIN THE LAST 5 DAYS. Performed at [facility], [address] [place], [place] [address]    Report Status 06/17/2024 FINAL  Final  Culture, blood (Routine X 2) w Reflex to ID Panel     Status: Abnormal   Collection Time: 06/12/24 10:57 PM   Specimen: BLOOD LEFT HAND  Result Value Ref Range Status   Specimen Description   Final    BLOOD LEFT HAND Performed at [facility], [address] [place], [place] [address]    Special Requests   Final    BOTTLES DRAWN AEROBIC AND ANAEROBIC Blood Culture adequate volume Performed at [facility],  [address], [place], [place] [address]    Culture  Setup Time   Final    GRAM POSITIVE COCCI IN BOTH AEROBIC AND ANAEROBIC BOTTLES Gram Stain Report Called to,Read Back By and Verified With: JESSICA ELLER,RN AT 2315 06/13/24 BY A. SNYDER CRITICAL RESULT CALLED TO, READ BACK BY AND VERIFIED WITH: LOISE MORONES RN 06/14/2024 @ 0414 BY AB GRAM STAIN REVIEWED-AGREE WITH RESULT Performed at [facility], [address], [place], [place] [address]    Culture STAPHYLOCOCCUS EPIDERMIDIS (A)  Final   Report Status 06/17/2024 FINAL  Final   Organism ID, Bacteria STAPHYLOCOCCUS EPIDERMIDIS  Final      Susceptibility   Staphylococcus epidermidis - MIC*    CIPROFLOXACIN  >=8 RESISTANT Resistant     ERYTHROMYCIN >=8 RESISTANT Resistant     GENTAMICIN <=0.5 SENSITIVE Sensitive     OXACILLIN >=4 RESISTANT Resistant     TETRACYCLINE >=16 RESISTANT Resistant     VANCOMYCIN  <=0.5 SENSITIVE Sensitive     TRIMETH/SULFA 160 RESISTANT Resistant     CLINDAMYCIN >=8 RESISTANT Resistant     RIFAMPIN <=0.5 SENSITIVE Sensitive     Inducible Clindamycin NEGATIVE Sensitive     * STAPHYLOCOCCUS EPIDERMIDIS  Blood Culture ID Panel (Reflexed)     Status: Abnormal   Collection Time: 06/12/24 10:57 PM  Result Value Ref Range Status   Enterococcus faecalis NOT DETECTED NOT DETECTED Final   Enterococcus Faecium NOT DETECTED NOT DETECTED Final   Listeria monocytogenes NOT DETECTED NOT DETECTED Final   Staphylococcus species DETECTED (A) NOT DETECTED Final    Comment: CRITICAL RESULT CALLED TO, READ BACK BY AND VERIFIED WITH: N BAUGH RN 06/14/2024 @ 0414 BY AB    Staphylococcus aureus (BCID) NOT DETECTED NOT DETECTED Final   Staphylococcus epidermidis DETECTED (A) NOT DETECTED Final    Comment: Methicillin (oxacillin) resistant coagulase negative staphylococcus. Possible blood culture contaminant (unless isolated from more than  one blood culture draw or clinical case suggests pathogenicity). No antibiotic treatment is  indicated for blood  culture contaminants. CRITICAL RESULT CALLED TO, READ BACK BY AND VERIFIED WITH: N BAUGH RN 06/14/2024 @ 0414 BY AB    Staphylococcus lugdunensis NOT DETECTED NOT DETECTED Final   Streptococcus species NOT DETECTED NOT DETECTED Final   Streptococcus agalactiae NOT DETECTED NOT DETECTED Final   Streptococcus pneumoniae NOT DETECTED NOT DETECTED Final   Streptococcus pyogenes NOT DETECTED NOT DETECTED Final   A.calcoaceticus-baumannii NOT DETECTED NOT DETECTED Final   Bacteroides fragilis NOT DETECTED NOT DETECTED Final   Enterobacterales NOT DETECTED NOT DETECTED Final   Enterobacter cloacae complex NOT DETECTED NOT DETECTED Final   Escherichia coli NOT DETECTED NOT DETECTED Final   Klebsiella aerogenes NOT DETECTED NOT DETECTED Final   Klebsiella oxytoca NOT DETECTED NOT DETECTED Final   Klebsiella pneumoniae NOT DETECTED NOT DETECTED Final   Proteus species NOT DETECTED NOT DETECTED Final   Salmonella species NOT DETECTED NOT DETECTED Final   Serratia marcescens NOT DETECTED NOT DETECTED Final   Haemophilus influenzae NOT DETECTED NOT DETECTED Final   Neisseria meningitidis NOT DETECTED NOT DETECTED Final   Pseudomonas aeruginosa NOT DETECTED NOT DETECTED Final   Stenotrophomonas maltophilia NOT DETECTED NOT DETECTED Final   Candida albicans NOT DETECTED NOT DETECTED Final   Candida auris NOT DETECTED NOT DETECTED Final   Candida glabrata NOT DETECTED NOT DETECTED Final   Candida krusei NOT DETECTED NOT DETECTED Final   Candida parapsilosis NOT DETECTED NOT DETECTED Final   Candida tropicalis NOT DETECTED NOT DETECTED Final   Cryptococcus neoformans/gattii NOT DETECTED NOT DETECTED Final   Methicillin resistance mecA/C DETECTED (A) NOT DETECTED Final    Comment: CRITICAL RESULT CALLED TO, READ BACK BY AND VERIFIED WITHBETHA LOISE MORONES RN 06/14/2024 @ 0414 BY AB Performed at [facility], [address] [place], [place] [address]   Urine Culture     Status:  Abnormal   Collection Time: 06/13/24  1:20 PM   Specimen: Urine, Clean Catch  Result Value Ref Range Status   Specimen Description   Final    URINE, CLEAN CATCH Performed at [facility], [address] [place], [place] [address]    Special Requests   Final    NONE Performed at [facility], [address] [place], [place] [address]    Culture (A)  Final    <10,000 COLONIES/mL INSIGNIFICANT GROWTH Performed at [facility], [address], [place], [place] [address]    Report Status 06/14/2024 FINAL  Final  Culture, blood (Routine X 2) w Reflex to ID Panel     Status: Abnormal   Collection Time: 06/14/24  7:44 AM   Specimen: BLOOD  Result Value Ref Range Status   Specimen Description   Final    BLOOD RIGHT ANTECUBITAL Performed at [facility], [address] [place], [place] [address]    Special Requests   Final    BOTTLES DRAWN AEROBIC AND ANAEROBIC Blood Culture adequate volume Performed at [facility], [address] [place], [place] [address]    Culture  Setup Time   Final    GRAM POSITIVE COCCI IN BOTH AEROBIC AND ANAEROBIC BOTTLES Gram Stain Report Called to,Read Back By and Verified With: DSABRA MOLT ON 06/15/2024 @12 :50 BY T.HAMER Performed at [facility], [address], [place], [place] [address]    Culture (A)  Final    STAPHYLOCOCCUS EPIDERMIDIS SUSCEPTIBILITIES PERFORMED ON PREVIOUS CULTURE WITHIN THE LAST  5 DAYS. Performed at [facility], [address] [place], [place] [address]    Report Status 06/17/2024 FINAL  Final  Culture, blood (Routine X 2) w Reflex to ID Panel     Status: Abnormal   Collection Time: 06/14/24  7:44 AM   Specimen: BLOOD  Result Value Ref Range Status   Specimen Description   Final    BLOOD LEFT ANTECUBITAL Performed at [facility], [address] [place], [place] [address]    Special Requests   Final    BOTTLES DRAWN AEROBIC AND ANAEROBIC Blood Culture adequate volume Performed at [facility], [address],  [place], [place] [address]    Culture  Setup Time   Final    GRAM POSITIVE COCCI IN BOTH AEROBIC AND ANAEROBIC BOTTLES Gram Stain Report Called to,Read Back By and Verified With: B. ROBERTSON ON 06/15/2024 @13 :49 BY T.HAMER Performed at [facility], [address], [place], [place] [address]    Culture (A)  Final    STAPHYLOCOCCUS EPIDERMIDIS SUSCEPTIBILITIES PERFORMED ON PREVIOUS CULTURE WITHIN THE LAST 5 DAYS. Performed at [facility], [address], [place], [place] [address]    Report Status 06/17/2024 FINAL  Final  Culture, blood (Routine X 2) w Reflex to ID Panel     Status: None (Preliminary result)   Collection Time: 06/16/24  2:30 AM   Specimen: BLOOD LEFT ARM  Result Value Ref Range Status   Specimen Description BLOOD LEFT ARM  Final   Special Requests AEROBIC BOTTLE ONLY Blood Culture adequate volume  Final   Culture   Final    NO GROWTH 3 DAYS Performed at [facility], [address], [place], [place] [address]    Report Status PENDING  Incomplete  Culture, blood (Routine X 2) w Reflex to ID Panel     Status: None (Preliminary result)   Collection Time: 06/16/24  2:35 AM   Specimen: Right Antecubital; Blood  Result Value Ref Range Status   Specimen Description RIGHT ANTECUBITAL  Final   Special Requests   Final    BOTTLES DRAWN AEROBIC AND ANAEROBIC Blood Culture adequate volume   Culture   Final    NO GROWTH 3 DAYS Performed at [facility], [address] [place], [place] [address]    Report Status PENDING  Incomplete         Radiology Studies: CARDIAC CATHETERIZATION Result Date: 06/20/2024 Images from the original result were not included. Cardiac Catheterization 06/20/24: Hemodynamic data: Right heart catheterization RA 5/7, mean 5 mmHg RV 23/3, EDP 7 mmHg PA 23/9, mean 12 mmHg PW 12/11, mean 11 mmHg. PA saturation 72%, AO saturation 98%. QP/QS 1.0. CO 6.6, CI 3.88. Angiographic data: LM: Ostial 40 to 50% stenosis, mild calcification is evident. LAD:  Large-caliber vessel, mild diffuse disease is evident.  There is competitive filling noted in the mid LAD. RI: Small vessel, minimal diffuse disease. LCx: Very large caliber vessel.  Gives origin to large OM1.  Minimal disease evident. RCA: Calcific ostium, mild coronary calcification is evident in the proximal to mid segment, mild luminal irregularity noted. LIMA to LAD: Widely patent. SVG to RI: Widely patent. Impression and recommendations: Normal right heart catheterization with reduced pulmonary wedge, fluid distention performed in the lab.  Encourage increased oral intake.  No change in coronary anatomy since prior cardiac catheterization on 10/09/2023 with presence of new patent grafts.           LOS: 6 days   Time spent=  35 mins    Burgess JAYSON Dare, MD [facility]  If 7PM-7AM, please contact night-coverage  06/21/2024, 11:19 AM

## 2024-06-21 NOTE — Progress Notes (Addendum)
 Progress Note  Patient Name: Kyle Matthews Date of Encounter: 06/21/2024  Primary Cardiologist: Arun K Thukkani, MD   Subjective   No chest pain or sob.   Inpatient Medications    Scheduled Meds:  aspirin   81 mg Oral Daily   finasteride   5 mg Oral QPM   pantoprazole  (PROTONIX ) IV  40 mg Intravenous Q24H   rosuvastatin   40 mg Oral Daily   Continuous Infusions:  sodium chloride      vancomycin  1,500 mg (06/20/24 2134)   PRN Meds: sodium chloride , acetaminophen  **OR** acetaminophen , diphenhydrAMINE -zinc  acetate, furosemide , ondansetron  **OR** ondansetron  (ZOFRAN ) IV, polyethylene glycol, sodium chloride  flush   Vital Signs    Vitals:   06/20/24 1948 06/21/24 0029 06/21/24 0416 06/21/24 0844  BP: 121/71 113/70 (!) 145/94 (!) 116/57  Pulse: 73 74 79 76  Resp: 18 18 18 18   Temp: 98.5 F (36.9 C) 98.3 F (36.8 C) 98.1 F (36.7 C) 98.2 F (36.8 C)  TempSrc: Oral Oral Oral Oral  SpO2: 99% 99% 98% 94%  Weight:      Height:        Intake/Output Summary (Last 24 hours) at 06/21/2024 0900 Last data filed at 06/21/2024 0842 Gross per 24 hour  Intake 720 ml  Output --  Net 720 ml   Filed Weights   06/12/24 1657 06/12/24 2330 06/20/24 0616  Weight: 71 kg 69.5 kg 69 kg    Telemetry    nsr - Personally Reviewed  ECG    NSR - Personally Reviewed  Physical Exam   GEN: No acute distress.   Neck: No JVD Cardiac: RRR, no murmurs, rubs, or gallops.  Respiratory: Clear to auscultation bilaterally. GI: Soft, nontender, non-distended  MS: No edema; No deformity. Neuro:  Nonfocal  Psych: Normal affect   Labs    Chemistry Recent Labs  Lab 06/18/24 0441 06/19/24 0429 06/20/24 0406 06/20/24 1018 06/20/24 1023 06/21/24 0424  NA 134* 134* 136 137 139  --   K 3.3* 3.5 3.6 3.8 3.4*  --   CL 102 103 103  --   --   --   CO2 24 24 24   --   --   --   GLUCOSE 91 113* 104*  --   --   --   BUN 9 10 10   --   --   --   CREATININE 0.62 0.65 0.91  --   --  0.90  CALCIUM  8.3*  8.3* 8.6*  --   --   --   GFRNONAA >60 >60 >60  --   --  >60  ANIONGAP 8 7 9   --   --   --      Hematology Recent Labs  Lab 06/16/24 0230 06/17/24 0312 06/20/24 0406 06/20/24 1018 06/20/24 1023  WBC 14.0* 11.5* 12.5*  --   --   RBC 3.81* 3.31* 3.21*  --   --   HGB 11.3* 10.1* 9.7* 9.2* 8.8*  HCT 35.0* 29.8* 29.1* 27.0* 26.0*  MCV 91.9 90.0 90.7  --   --   MCH 29.7 30.5 30.2  --   --   MCHC 32.3 33.9 33.3  --   --   RDW 13.6 13.6 13.6  --   --   PLT 229 204 189  --   --     Cardiac EnzymesNo results for input(s): TROPONINI in the last 168 hours. No results for input(s): TROPIPOC in the last 168 hours.   BNPNo results for input(s): BNP, PROBNP in  the last 168 hours.   DDimer No results for input(s): DDIMER in the last 168 hours.   Radiology    CARDIAC CATHETERIZATION Result Date: 06/20/2024 Images from the original result were not included. Cardiac Catheterization 06/20/24: Hemodynamic data: Right heart catheterization RA 5/7, mean 5 mmHg RV 23/3, EDP 7 mmHg PA 23/9, mean 12 mmHg PW 12/11, mean 11 mmHg. PA saturation 72%, AO saturation 98%. QP/QS 1.0. CO 6.6, CI 3.88. Angiographic data: LM: Ostial 40 to 50% stenosis, mild calcification is evident. LAD: Large-caliber vessel, mild diffuse disease is evident.  There is competitive filling noted in the mid LAD. RI: Small vessel, minimal diffuse disease. LCx: Very large caliber vessel.  Gives origin to large OM1.  Minimal disease evident. RCA: Calcific ostium, mild coronary calcification is evident in the proximal to mid segment, mild luminal irregularity noted. LIMA to LAD: Widely patent. SVG to RI: Widely patent. Impression and recommendations: Normal right heart catheterization with reduced pulmonary wedge, fluid distention performed in the lab.  Encourage increased oral intake.  No change in coronary anatomy since prior cardiac catheterization on 10/09/2023 with presence of new patent grafts.    Cardiac Studies   [name]  noted  Patient Profile     78 y.o. male admitted with fevers and found to have Staph Epi bacteremia and aortic valve SBE  Assessment & Plan    Aortic and tricuspid valve SBE, prosthetic valve - continue IV anti-biotics as per the ID service. Note plans for redo AVR next week. Of note the patient has had an indwelling foley with is likely the source.  CAD - cath findings as noted by Dr. Kate.     For questions or updates, please contact [facility] Please consult [email or web address] for contact info under Cardiology/STEMI.      Signed, Danelle Birmingham, MD  06/21/2024, 9:00 AM

## 2024-06-22 ENCOUNTER — Encounter (HOSPITAL_COMMUNITY): Payer: Self-pay | Admitting: Cardiology

## 2024-06-22 DIAGNOSIS — I639 Cerebral infarction, unspecified: Secondary | ICD-10-CM | POA: Diagnosis not present

## 2024-06-22 DIAGNOSIS — T826XXA Infection and inflammatory reaction due to cardiac valve prosthesis, initial encounter: Secondary | ICD-10-CM | POA: Diagnosis not present

## 2024-06-22 DIAGNOSIS — I330 Acute and subacute infective endocarditis: Secondary | ICD-10-CM | POA: Diagnosis not present

## 2024-06-22 LAB — VANCOMYCIN, TROUGH: Vancomycin Tr: 29 ug/mL (ref 15–20)

## 2024-06-22 MED ORDER — VANCOMYCIN HCL 1250 MG/250ML IV SOLN
1250.0000 mg | Freq: Two times a day (BID) | INTRAVENOUS | Status: DC
  Administered 2024-06-22 – 2024-06-25 (×6): 1250 mg via INTRAVENOUS
  Filled 2024-06-22 (×6): qty 250

## 2024-06-22 NOTE — Progress Notes (Signed)
 Pharmacy Antibiotic Note  Jeri Rawlins is a 78 y.o. male admitted on 06/12/2024 with MRSE bacteremia. Pt noted to have vegetation on prosthetic aortic valve and tricuspid valve, and also possible CNS emboli.  Pharmacy has been consulted for vancomycin  dosing. Cr has remained <1,  Vancomycin  1gm IV q12 with VT 12> dose increased to 1500mg  IV q12h  with VT 29 - drawn correctly -prior to dose given    Plan: Decrease  vancomycin  to 1250mg  IV q12h Follow up post OR this week   Height: 5' 2 (157.5 cm) Weight: 69 kg (152 lb 1.9 oz) IBW/kg (Calculated) : 54.6  Temp (24hrs), Avg:98.5 F (36.9 C), Min:98 F (36.7 C), Max:98.9 F (37.2 C)  Recent Labs  Lab 06/16/24 0230 06/16/24 1201 06/17/24 0312 06/17/24 0916 06/18/24 0441 06/19/24 0429 06/19/24 1033 06/20/24 0406 06/21/24 0424 06/22/24 0948  WBC 14.0*  --  11.5*  --   --   --   --  12.5*  --   --   CREATININE  --   --  0.59*  --  0.62 0.65  --  0.91 0.90  --   VANCOTROUGH  --   --   --    < >  --   --  12*  --   --  29*  VANCOPEAK  --  29*  --   --   --   --   --   --   --   --    < > = values in this interval not displayed.    Estimated Creatinine Clearance: 60.6 mL/min (by C-G formula based on SCr of 0.9 mg/dL).    Allergies  Allergen Reactions   Tetanus Toxoids Other (See Comments)    Childhood Allergy    Zetia  [Ezetimibe ] Diarrhea    Antimicrobials this admission: Vanco 6/27 >>  Microbiology results: 6/27 BCx: NGTD 6/25 Bcx: gram + cocci 2/2 sets 6/25 BCID: staph species/staph epi     Olam Chalk Pharm.D. CPP, BCPS Clinical Pharmacist [phone] 06/22/2024 11:44 AM   Please check AMION for all [facility] numbers 06/22/2024

## 2024-06-22 NOTE — Progress Notes (Signed)
 Date and time results received: 06/22/24 1125 (use smartphrase .now to insert current time)  Test: VANCOMYCIN  TROUGH Critical Value: 29  Name of Provider Notified: OLAM CHALK (PHARMACIST)  Orders Received? Or Actions Taken?: LISA CURRAN (PHARMACIST) STATED TO STOP INFUSION AND SHE WOULD DECREASE NEXT DOSE.

## 2024-06-22 NOTE — Progress Notes (Signed)
  Progress Note  Patient Name: Kyle Matthews Date of Encounter: 06/22/2024 [name] Cardiologist: Kyle MARLA Red, MD   Interval Summary   No chest pain or dyspnea  Vital Signs Vitals:   06/21/24 1924 06/22/24 0026 06/22/24 0500 06/22/24 0853  BP: 128/77 127/82 (!) 134/91 126/72  Pulse: 100 89 92 75  Resp: 20 16 20 18   Temp: 98.9 F (37.2 C) 98.7 F (37.1 C) 98.5 F (36.9 C) 98.8 F (37.1 C)  TempSrc: Oral (P) Oral Oral Oral  SpO2: 100% 99% 100% 96%  Weight:      Height:        Intake/Output Summary (Last 24 hours) at 06/22/2024 1229 Last data filed at 06/22/2024 0857 Gross per 24 hour  Intake 1197 ml  Output 700 ml  Net 497 ml      06/20/2024    6:16 AM 06/12/2024   11:30 PM 06/12/2024    4:57 PM  Last 3 Weights  Weight (lbs) 152 lb 1.9 oz 153 lb 3.5 oz 156 lb 8.4 oz  Weight (kg) 69 kg 69.5 kg 71 kg      Telemetry/ECG  Sinus rhythm, HR 80s - Personally Reviewed  Physical Exam  GEN: No acute distress.   Neck: No JVD Cardiac: RRR, SEM  Respiratory: Clear to auscultation bilaterally. GI: Soft, nontender, non-distended  MS: No edema  Assessment & Plan  78 y.o. male with a history of CAD status post CABG, aortic stenosis status post bioprosthetic AVR in 10/2023, CVA, hypertension, hyperlipidemia who we are consulted for evaluation of infective endocarditis    Prosthetic valve endocarditis  Presented with chills and rigors  Blood cultures showed methicillin resistant Staphylococcus epidermidis  TTE showed increased gradients thru bioprosthetic aortic valve  Started on IV antibiotics and transferred to [facility] TEE showed mobile valvular vegetation at 1.5 cm, mean gradient 20 mmHg, trivial AI, also showed 1 cm vegetation on tricuspid valve with moderate TR LHC showed patent LIMA-LAD and SVG-ramus, 40 to 50% ostial left main disease.  RHC with RA 5, RV 23/3, PA 23/9/12, PCWP 11, PA sat 72%, CI 3.9.   CT surgery and ID consulted  CT surgery discussed plans for  surgical intervention, patient would like to proceed with surgery, scheduled for 7/11  Chest pain  Elevated troponin level  Troponin 99 > 86 Echo showed normal EF  Suspect likely due to demand ischemia    Acute CVA  Brain MRI showed small acute infarct in left occipital lobe  Suspect its due to prosthetic aortic valve endocarditis  Followed by neurology    For questions or updates, please contact [facility] Please consult [email or web address] for contact info under       Signed, Kyle LITTIE Nanas, MD

## 2024-06-22 NOTE — Progress Notes (Signed)
 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  Blood cultures positive for MRSE, MRI brain showed concerns of possible embolic stroke.  Echocardiogram negative for endocarditis but TEE showed large mobile density on tricuspid and prosthetic valve.  CT surgery and cardiology team consulted. LHC does not show any new coronary disease, grafts are patent.  CT surgery planning intervention next week   Assessment & Plan:  Principal Problem:   Endocarditis of prosthetic aortic valve Active Problems:   Chronic heart failure with preserved ejection fraction (HFpEF) (HCC)   BPH with obstruction/lower urinary tract symptoms   Essential hypertension   Chest pain   S/P CABG x 2   S/P AVR (aortic valve replacement)   SIRS (systemic inflammatory response syndrome) (HCC)   Bacteremia   Acute embolic stroke (HCC)   Endocarditis of tricuspid valve   Congestive heart failure (HCC)   Prosthetic valve endocarditis   Acute stroke due to ischemia (HCC)   MRSE bacteremia 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  TEE shows concerns of endocarditis.  CT surgery, ID and cardiology team is following.  MRI is also positive for embolic CVA -Initial blood culture 6/27-positive for MRSE -Repeat blood cultures 6/29-NGTD - LHC does not show any new coronary disease, grafts are patent.  CT surgery planning intervention next week  Acute CVA, likely embolic - MRI brain is positive for acute CVA.  Echocardiogram shows EF 50%, moderate TR.  LDL 66, A1c 5.1.   Neurology recommended aspirin .  History of BPH - Status post laparoscopic prostatectomy on 6/4, Foley catheter was removed on 6/16.  Currently able to urinate.  Continue finasteride   Hyperlipidemia - Statin  Essential hypertension - IV as needed   DVT prophylaxis:     Code Status: Full Code Family Communication:   Status is: Inpatient Ongoing management for endocarditis.    Subjective:  Doing well. No complaints.   Examination:  General exam: Appears calm and comfortable  Respiratory system: Clear to auscultation. Respiratory effort normal. Cardiovascular system: S1 & S2 heard, RRR. No JVD, murmurs, rubs, gallops or clicks. No pedal edema. Gastrointestinal system: Abdomen is nondistended, soft and nontender. No organomegaly or masses felt. Normal bowel sounds heard. Central nervous system: Alert and oriented. No focal neurological deficits. Extremities: Symmetric 5 x 5 power. Skin: No specific facial rash, pruritus Psychiatry: Judgement and insight appear normal. Mood & affect appropriate.                Diet Orders (From admission, onward)     Start     Ordered   06/20/24 1237  Diet Heart Room service appropriate? Yes; Fluid consistency: Thin  Diet effective now       Question Answer Comment  Room service appropriate? Yes   Fluid consistency: Thin      06/20/24 1236            Objective: Vitals:   06/21/24 1924 06/22/24 0026 06/22/24 0500 06/22/24 0853  BP: 128/77 127/82 (!) 134/91 126/72  Pulse: 100 89 92 75  Resp: 20 16 20 18   Temp: 98.9 F (  37.2 C) 98.7 F (37.1 C) 98.5 F (36.9 C) 98.8 F (37.1 C)  TempSrc: Oral (P) Oral Oral Oral  SpO2: 100% 99% 100% 96%  Weight:      Height:        Intake/Output Summary (Last 24 hours) at 06/22/2024 1041 Last data filed at 06/22/2024 0857 Gross per 24 hour  Intake 1317 ml  Output 700 ml  Net 617 ml   Filed Weights   06/12/24 1657 06/12/24 2330 06/20/24 0616  Weight: 71 kg 69.5 kg 69 kg     Scheduled Meds:  aspirin   81 mg Oral Daily   famotidine   20 mg Oral BID   finasteride   5 mg Oral QPM   pantoprazole  (PROTONIX ) IV  40 mg Intravenous Q24H   rosuvastatin   40 mg Oral Daily   Continuous Infusions:  vancomycin  1,500 mg (06/22/24 1039)    Nutritional status     Body mass index is 27.82 kg/m.  Data Reviewed:   CBC: Recent Labs  Lab 06/16/24 0230 06/17/24 0312 06/20/24 0406 06/20/24 1018 06/20/24 1023  WBC 14.0* 11.5* 12.5*  --   --   HGB 11.3* 10.1* 9.7* 9.2* 8.8*  HCT 35.0* 29.8* 29.1* 27.0* 26.0*  MCV 91.9 90.0 90.7  --   --   PLT 229 204 189  --   --    Basic Metabolic Panel: Recent Labs  Lab 06/17/24 0312 06/18/24 0441 06/19/24 0429 06/20/24 0406 06/20/24 1018 06/20/24 1023 06/21/24 0424  NA 134* 134* 134* 136 137 139  --   K 3.4* 3.3* 3.5 3.6 3.8 3.4*  --   CL 102 102 103 103  --   --   --   CO2 22 24 24 24   --   --   --   GLUCOSE 92 91 113* 104*  --   --   --   BUN 9 9 10 10   --   --   --   CREATININE 0.59* 0.62 0.65 0.91  --   --  0.90  CALCIUM  8.2* 8.3* 8.3* 8.6*  --   --   --    GFR: Estimated Creatinine Clearance: 60.6 mL/min (by C-G formula based on SCr of 0.9 mg/dL). Liver Function Tests: No results for input(s): AST, ALT, ALKPHOS, BILITOT, PROT, ALBUMIN  in the last 168 hours. No results for input(s): LIPASE, AMYLASE in the last 168 hours. No results for input(s): AMMONIA in the last 168 hours. Coagulation Profile: No results for input(s): INR, PROTIME in the last 168 hours. Cardiac Enzymes: No results for input(s): CKTOTAL, CKMB, CKMBINDEX, TROPONINI in the last 168 hours. BNP (last 3 results) No results for input(s): PROBNP in the last 8760 hours. HbA1C: No results for input(s): HGBA1C in the last 72 hours. CBG: Recent Labs  Lab 06/18/24 2129  GLUCAP 122*   Lipid Profile: No results for input(s): CHOL, HDL, LDLCALC, TRIG, CHOLHDL, LDLDIRECT in the last 72  hours. Thyroid  Function Tests: No results for input(s): TSH, T4TOTAL, FREET4, T3FREE, THYROIDAB in the last 72 hours. Anemia Panel: No results for input(s): VITAMINB12, FOLATE, FERRITIN, TIBC, IRON , RETICCTPCT in the last 72 hours. Sepsis Labs: No results for input(s): PROCALCITON, LATICACIDVEN in the last 168 hours.  Recent Results (from the past 240 hours)  Culture, blood (Routine X 2) w Reflex to ID Panel     Status: Abnormal   Collection Time: 06/12/24 10:55 PM   Specimen: BLOOD  Result Value Ref Range Status   Specimen Description   Final  BLOOD BLOOD LEFT ARM Performed at [facility], [address], [place], [place] [address]    Special Requests   Final    BOTTLES DRAWN AEROBIC AND ANAEROBIC Blood Culture adequate volume Performed at [facility], [address], [place], [place] [address]    Culture  Setup Time   Final    GRAM POSITIVE COCCI IN BOTH AEROBIC AND ANAEROBIC BOTTLES CRITICAL VALUE NOTED.  VALUE IS CONSISTENT WITH PREVIOUSLY REPORTED AND CALLED VALUE. JESSICA ELLER AT 2315 06/13/24 BY A. SNYDER GRAM STAIN REVIEWED-AGREE WITH RESULT    Culture (A)  Final    STAPHYLOCOCCUS EPIDERMIDIS SUSCEPTIBILITIES PERFORMED ON PREVIOUS CULTURE WITHIN THE LAST 5 DAYS. Performed at [facility], [address] [place], [place] [address]    Report Status 06/17/2024 FINAL  Final  Culture, blood (Routine X 2) w Reflex to ID Panel     Status: Abnormal   Collection Time: 06/12/24 10:57 PM   Specimen: BLOOD LEFT HAND  Result Value Ref Range Status   Specimen Description   Final    BLOOD LEFT HAND Performed at [facility], [address] [place], [place] [address]    Special Requests   Final    BOTTLES DRAWN AEROBIC AND ANAEROBIC Blood Culture adequate volume Performed at [facility], [address] [place], [place] [address]    Culture  Setup Time   Final    GRAM POSITIVE COCCI IN BOTH AEROBIC AND ANAEROBIC BOTTLES Gram Stain  Report Called to,Read Back By and Verified With: JESSICA ELLER,RN AT 2315 06/13/24 BY A. SNYDER CRITICAL RESULT CALLED TO, READ BACK BY AND VERIFIED WITH: LOISE MORONES RN 06/14/2024 @ 0414 BY AB GRAM STAIN REVIEWED-AGREE WITH RESULT Performed at [facility], [address], [place], [place] [address]    Culture STAPHYLOCOCCUS EPIDERMIDIS (A)  Final   Report Status 06/17/2024 FINAL  Final   Organism ID, Bacteria STAPHYLOCOCCUS EPIDERMIDIS  Final      Susceptibility   Staphylococcus epidermidis - MIC*    CIPROFLOXACIN  >=8 RESISTANT Resistant     ERYTHROMYCIN >=8 RESISTANT Resistant     GENTAMICIN <=0.5 SENSITIVE Sensitive     OXACILLIN >=4 RESISTANT Resistant     TETRACYCLINE >=16 RESISTANT Resistant     VANCOMYCIN  <=0.5 SENSITIVE Sensitive     TRIMETH/SULFA 160 RESISTANT Resistant     CLINDAMYCIN >=8 RESISTANT Resistant     RIFAMPIN <=0.5 SENSITIVE Sensitive     Inducible Clindamycin NEGATIVE Sensitive     * STAPHYLOCOCCUS EPIDERMIDIS  Blood Culture ID Panel (Reflexed)     Status: Abnormal   Collection Time: 06/12/24 10:57 PM  Result Value Ref Range Status   Enterococcus faecalis NOT DETECTED NOT DETECTED Final   Enterococcus Faecium NOT DETECTED NOT DETECTED Final   Listeria monocytogenes NOT DETECTED NOT DETECTED Final   Staphylococcus species DETECTED (A) NOT DETECTED Final    Comment: CRITICAL RESULT CALLED TO, READ BACK BY AND VERIFIED WITH: N BAUGH RN 06/14/2024 @ 0414 BY AB    Staphylococcus aureus (BCID) NOT DETECTED NOT DETECTED Final   Staphylococcus epidermidis DETECTED (A) NOT DETECTED Final    Comment: Methicillin (oxacillin) resistant coagulase negative staphylococcus. Possible blood culture contaminant (unless isolated from more than one blood culture draw or clinical case suggests pathogenicity). No antibiotic treatment is indicated for blood  culture contaminants. CRITICAL RESULT CALLED TO, READ BACK BY AND VERIFIED WITH: N BAUGH RN 06/14/2024 @ 0414 BY AB     Staphylococcus lugdunensis NOT DETECTED NOT  DETECTED Final   Streptococcus species NOT DETECTED NOT DETECTED Final   Streptococcus agalactiae NOT DETECTED NOT DETECTED Final   Streptococcus pneumoniae NOT DETECTED NOT DETECTED Final   Streptococcus pyogenes NOT DETECTED NOT DETECTED Final   A.calcoaceticus-baumannii NOT DETECTED NOT DETECTED Final   Bacteroides fragilis NOT DETECTED NOT DETECTED Final   Enterobacterales NOT DETECTED NOT DETECTED Final   Enterobacter cloacae complex NOT DETECTED NOT DETECTED Final   Escherichia coli NOT DETECTED NOT DETECTED Final   Klebsiella aerogenes NOT DETECTED NOT DETECTED Final   Klebsiella oxytoca NOT DETECTED NOT DETECTED Final   Klebsiella pneumoniae NOT DETECTED NOT DETECTED Final   Proteus species NOT DETECTED NOT DETECTED Final   Salmonella species NOT DETECTED NOT DETECTED Final   Serratia marcescens NOT DETECTED NOT DETECTED Final   Haemophilus influenzae NOT DETECTED NOT DETECTED Final   Neisseria meningitidis NOT DETECTED NOT DETECTED Final   Pseudomonas aeruginosa NOT DETECTED NOT DETECTED Final   Stenotrophomonas maltophilia NOT DETECTED NOT DETECTED Final   Candida albicans NOT DETECTED NOT DETECTED Final   Candida auris NOT DETECTED NOT DETECTED Final   Candida glabrata NOT DETECTED NOT DETECTED Final   Candida krusei NOT DETECTED NOT DETECTED Final   Candida parapsilosis NOT DETECTED NOT DETECTED Final   Candida tropicalis NOT DETECTED NOT DETECTED Final   Cryptococcus neoformans/gattii NOT DETECTED NOT DETECTED Final   Methicillin resistance mecA/C DETECTED (A) NOT DETECTED Final    Comment: CRITICAL RESULT CALLED TO, READ BACK BY AND VERIFIED WITHBETHA LOISE MORONES RN 06/14/2024 @ 0414 BY AB Performed at [facility], [address] [place], [place] [address]   Urine Culture     Status: Abnormal   Collection Time: 06/13/24  1:20 PM   Specimen: Urine, Clean Catch  Result Value Ref Range Status   Specimen Description   Final     URINE, CLEAN CATCH Performed at [facility], [address], [place], [place] [address]    Special Requests   Final    NONE Performed at [facility], [address], [place], [place] [address]    Culture (A)  Final    <10,000 COLONIES/mL INSIGNIFICANT GROWTH Performed at [facility], [address], [place], [place] [address]    Report Status 06/14/2024 FINAL  Final  Culture, blood (Routine X 2) w Reflex to ID Panel     Status: Abnormal   Collection Time: 06/14/24  7:44 AM   Specimen: BLOOD  Result Value Ref Range Status   Specimen Description   Final    BLOOD RIGHT ANTECUBITAL Performed at [facility], [address], [place], [place] [address]    Special Requests   Final    BOTTLES DRAWN AEROBIC AND ANAEROBIC Blood Culture adequate volume Performed at [facility], [address], [place], [place] [address]    Culture  Setup Time   Final    GRAM POSITIVE COCCI IN BOTH AEROBIC AND ANAEROBIC BOTTLES Gram Stain Report Called to,Read Back By and Verified With: DSABRA MOLT ON 06/15/2024 @12 :50 BY T.HAMER Performed at [facility], [address] [place], [place] [address]    Culture (A)  Final    STAPHYLOCOCCUS EPIDERMIDIS SUSCEPTIBILITIES PERFORMED ON PREVIOUS CULTURE WITHIN THE LAST 5 DAYS. Performed at [facility], [address], [place], [place] [address]    Report Status 06/17/2024 FINAL  Final  Culture, blood (Routine X 2) w Reflex to ID Panel     Status: Abnormal   Collection Time: 06/14/24  7:44 AM   Specimen: BLOOD  Result Value Ref Range Status   Specimen Description   Final    BLOOD LEFT ANTECUBITAL Performed at [facility], [address], [place], [place] [address]    Special Requests   Final    BOTTLES DRAWN AEROBIC AND ANAEROBIC Blood Culture adequate volume Performed at [facility], [address] [place], [place] [address]    Culture  Setup Time   Final    GRAM POSITIVE COCCI IN BOTH AEROBIC AND ANAEROBIC BOTTLES Gram Stain Report Called  to,Read Back By and Verified With: B. ROBERTSON ON 06/15/2024 @13 :49 BY T.HAMER Performed at [facility], [address] [place], [place] [address]    Culture (A)  Final    STAPHYLOCOCCUS EPIDERMIDIS SUSCEPTIBILITIES PERFORMED ON PREVIOUS CULTURE WITHIN THE LAST 5 DAYS. Performed at [facility], [address] [place], [place] [address]    Report Status 06/17/2024 FINAL  Final  Culture, blood (Routine X 2) w Reflex to ID Panel     Status: None   Collection Time: 06/16/24  2:30 AM   Specimen: BLOOD LEFT ARM  Result Value Ref Range Status   Specimen Description BLOOD LEFT ARM  Final   Special Requests AEROBIC BOTTLE ONLY Blood Culture adequate volume  Final   Culture   Final    NO GROWTH 5 DAYS Performed at [facility], [address], [place], [place] [address]    Report Status 06/21/2024 FINAL  Final  Culture, blood (Routine X 2) w Reflex to ID Panel     Status: None   Collection Time: 06/16/24  2:35 AM   Specimen: Right Antecubital; Blood  Result Value Ref Range Status   Specimen Description RIGHT ANTECUBITAL  Final   Special Requests   Final    BOTTLES DRAWN AEROBIC AND ANAEROBIC Blood Culture adequate volume   Culture   Final    NO GROWTH 5 DAYS Performed at [facility], [address], [place], [place] [address]    Report Status 06/21/2024 FINAL  Final         Radiology Studies: No results found.         LOS: 7 days   Time spent= 35 mins    Burgess JAYSON Dare, MD [facility]  If 7PM-7AM, please contact night-coverage  06/22/2024, 10:41 AM

## 2024-06-22 NOTE — Plan of Care (Signed)
   Problem: Education: Goal: Knowledge of General Education information will improve Description Including pain rating scale, medication(s)/side effects and non-pharmacologic comfort measures Outcome: Progressing

## 2024-06-23 DIAGNOSIS — I330 Acute and subacute infective endocarditis: Secondary | ICD-10-CM | POA: Diagnosis not present

## 2024-06-23 DIAGNOSIS — T826XXA Infection and inflammatory reaction due to cardiac valve prosthesis, initial encounter: Secondary | ICD-10-CM | POA: Diagnosis not present

## 2024-06-23 NOTE — Progress Notes (Signed)
  Progress Note  Patient Name: Kyle Matthews Date of Encounter: 06/23/2024 [facility] Cardiologist: Lurena MARLA Red, MD   Interval Summary   No chest pain or dyspnea  Vital Signs Vitals:   06/22/24 1920 06/22/24 2340 06/23/24 0445 06/23/24 0842  BP: (!) 142/75 133/76 138/84   Pulse: 88 95 83   Resp: 18 20 16 18   Temp: 98.5 F (36.9 C) 98.2 F (36.8 C) 98.3 F (36.8 C) 98.2 F (36.8 C)  TempSrc: Oral Oral Oral Oral  SpO2: 99% 98% 100%   Weight:      Height:        Intake/Output Summary (Last 24 hours) at 06/23/2024 1033 Last data filed at 06/23/2024 0915 Gross per 24 hour  Intake 540 ml  Output --  Net 540 ml      06/20/2024    6:16 AM 06/12/2024   11:30 PM 06/12/2024    4:57 PM  Last 3 Weights  Weight (lbs) 152 lb 1.9 oz 153 lb 3.5 oz 156 lb 8.4 oz  Weight (kg) 69 kg 69.5 kg 71 kg      Telemetry/ECG  Sinus rhythm - Personally Reviewed  Physical Exam  GEN: No acute distress.   Neck: No JVD Cardiac: RRR, SEM  Respiratory: Clear to auscultation bilaterally. GI: Soft, nontender, non-distended  MS: No edema  Assessment & Plan  78 y.o. male with a history of CAD status post CABG, aortic stenosis status post bioprosthetic AVR in 10/2023, CVA, hypertension, hyperlipidemia who we are consulted for evaluation of infective endocarditis    Prosthetic valve endocarditis  Presented with chills and rigors  Blood cultures showed methicillin resistant Staphylococcus epidermidis  TTE showed increased gradients thru bioprosthetic aortic valve  Started on IV antibiotics and transferred to [facility] TEE showed mobile valvular vegetation at 1.5 cm, mean gradient 20 mmHg, trivial AI, also showed 1 cm vegetation on tricuspid valve with moderate TR LHC showed patent LIMA-LAD and SVG-ramus, 40 to 50% ostial left main disease.  RHC with RA 5, RV 23/3, PA 23/9/12, PCWP 11, PA sat 72%, CI 3.9.   CT surgery and ID consulted  CT surgery discussed plans for surgical intervention, patient  would like to proceed with surgery, scheduled for 7/11  Chest pain  Elevated troponin level  Troponin 99 > 86 Echo showed normal EF  Suspect likely due to demand ischemia    Acute CVA  Brain MRI showed small acute infarct in left occipital lobe  Suspect its due to prosthetic aortic valve endocarditis  Followed by neurology    For questions or updates, please contact [facility] Please consult [email or web address] for contact info under       Signed, [name], MD

## 2024-06-23 NOTE — Progress Notes (Signed)
 PROGRESS NOTE    Kyle Matthews  FMW:[id number] DOB: 1946-05-21 DOA: 06/12/2024 PCP: Verena Mems, MD    Brief Narrative:  78 year old with history of CAD status post CABG 10/2023, AAS status post bioprosthetic AVR, diastolic CHF, CVA, postop A-fib, HTN, HLD, BPH with urinary retention requiring robotic simple prostatectomy 6//25, HTN, HLD presented with chest pain and rigors on 6/25.  Blood cultures positive for MRSE, MRI brain showed concerns of possible embolic stroke.  Echocardiogram negative for endocarditis but TEE showed large mobile density on tricuspid and prosthetic valve.  CT surgery and cardiology team consulted. LHC does not show any new coronary disease, grafts are patent.  CT surgery planning intervention next week   Assessment & Plan:  Principal Problem:   Endocarditis of prosthetic aortic valve Active Problems:   Chronic heart failure with preserved ejection fraction (HFpEF) (HCC)   BPH with obstruction/lower urinary tract symptoms   Essential hypertension   Chest pain   S/P CABG x 2   S/P AVR (aortic valve replacement)   SIRS (systemic inflammatory response syndrome) (HCC)   Bacteremia   Acute embolic stroke (HCC)   Endocarditis of tricuspid valve   Congestive heart failure (HCC)   Prosthetic valve endocarditis   Acute stroke due to ischemia (HCC)   MRSE bacteremia 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  TEE shows concerns of endocarditis.  CT surgery, ID and cardiology team is following.  MRI is also positive for embolic CVA -Initial blood culture 6/27-positive for MRSE -Repeat blood cultures 6/29-NGTD - LHC does not show any new coronary disease, grafts are patent.  CT surgery planning intervention next week  Acute CVA, likely embolic - MRI brain is positive for acute CVA.  Echocardiogram shows EF 50%, moderate TR.  LDL 66, A1c 5.1.   Neurology recommended aspirin .  History of BPH - Status post laparoscopic prostatectomy on 6/4, Foley catheter was removed on 6/16.  Currently able to urinate.  Continue finasteride   Hyperlipidemia - Statin  Essential hypertension - IV as needed   DVT prophylaxis:     Code Status: Full Code Family Communication:   Status is: Inpatient Ongoing management for endocarditis.    Subjective:  Doing well. No complaints.  No chest pain  Examination:  General exam: Appears calm and comfortable  Respiratory system: Clear to auscultation. Respiratory effort normal. Cardiovascular system: S1 & S2 heard, RRR. No JVD, murmurs, rubs, gallops or clicks. No pedal edema. Gastrointestinal system: Abdomen is nondistended, soft and nontender. No organomegaly or masses felt. Normal bowel sounds heard. Central nervous system: Alert and oriented. No focal neurological deficits. Extremities: Symmetric 5 x 5 power. Skin: No specific facial rash, pruritus Psychiatry: Judgement and insight appear normal. Mood & affect appropriate.                Diet Orders (From admission, onward)     Start     Ordered   06/20/24 1237  Diet Heart Room service appropriate? Yes; Fluid consistency: Thin  Diet effective now       Question Answer Comment  Room service appropriate? Yes   Fluid consistency: Thin      06/20/24 1236            Objective: Vitals:   06/22/24 1920 06/22/24 2340 06/23/24 0445 06/23/24 0842  BP: (!) 142/75 133/76 138/84   Pulse: 88 95 83   Resp: 18 20 16  18  Temp: 98.5 F (36.9 C) 98.2 F (36.8 C) 98.3 F (36.8 C) 98.2 F (36.8 C)  TempSrc: Oral Oral Oral Oral  SpO2: 99% 98% 100%   Weight:      Height:        Intake/Output Summary (Last 24 hours) at 06/23/2024 0956 Last data filed at 06/23/2024 0915 Gross per 24 hour  Intake 540 ml  Output --  Net 540 ml   Filed Weights   06/12/24 1657 06/12/24 2330 06/20/24 0616  Weight: 71 kg 69.5 kg 69 kg     Scheduled Meds:  aspirin   81 mg Oral Daily   famotidine   20 mg Oral BID   finasteride   5 mg Oral QPM   pantoprazole  (PROTONIX ) IV  40 mg Intravenous Q24H   rosuvastatin   40 mg Oral Daily   Continuous Infusions:  vancomycin  1,250 mg (06/22/24 2243)    Nutritional status     Body mass index is 27.82 kg/m.  Data Reviewed:   CBC: Recent Labs  Lab 06/17/24 0312 06/20/24 0406 06/20/24 1018 06/20/24 1023  WBC 11.5* 12.5*  --   --   HGB 10.1* 9.7* 9.2* 8.8*  HCT 29.8* 29.1* 27.0* 26.0*  MCV 90.0 90.7  --   --   PLT 204 189  --   --    Basic Metabolic Panel: Recent Labs  Lab 06/17/24 0312 06/18/24 0441 06/19/24 0429 06/20/24 0406 06/20/24 1018 06/20/24 1023 06/21/24 0424  NA 134* 134* 134* 136 137 139  --   K 3.4* 3.3* 3.5 3.6 3.8 3.4*  --   CL 102 102 103 103  --   --   --   CO2 22 24 24 24   --   --   --   GLUCOSE 92 91 113* 104*  --   --   --   BUN 9 9 10 10   --   --   --   CREATININE 0.59* 0.62 0.65 0.91  --   --  0.90  CALCIUM  8.2* 8.3* 8.3* 8.6*  --   --   --    GFR: Estimated Creatinine Clearance: 60.6 mL/min (by C-G formula based on SCr of 0.9 mg/dL). Liver Function Tests: No results for input(s): AST, ALT, ALKPHOS, BILITOT, PROT, ALBUMIN  in the last 168 hours. No results for input(s): LIPASE, AMYLASE in the last 168 hours. No results for input(s): AMMONIA in the last 168 hours. Coagulation Profile: No results for input(s): INR, PROTIME in the last 168 hours. Cardiac Enzymes: No results for input(s): CKTOTAL, CKMB, CKMBINDEX, TROPONINI in the last 168 hours. BNP (last 3 results) No results for input(s): PROBNP in the last 8760 hours. HbA1C: No results for input(s): HGBA1C in the last 72 hours. CBG: Recent Labs  Lab 06/18/24 2129  GLUCAP 122*   Lipid Profile: No results for input(s): CHOL, HDL, LDLCALC, TRIG, CHOLHDL, LDLDIRECT in the last 72 hours. Thyroid  Function Tests: No results for  input(s): TSH, T4TOTAL, FREET4, T3FREE, THYROIDAB in the last 72 hours. Anemia Panel: No results for input(s): VITAMINB12, FOLATE, FERRITIN, TIBC, IRON , RETICCTPCT in the last 72 hours. Sepsis Labs: No results for input(s): PROCALCITON, LATICACIDVEN in the last 168 hours.  Recent Results (from the past 240 hours)  Urine Culture     Status: Abnormal   Collection Time: 06/13/24  1:20 PM   Specimen: Urine, Clean Catch  Result Value Ref Range Status   Specimen Description   Final    URINE, CLEAN CATCH Performed at [facility], [address], [place], [place] [address]    Special Requests   Final    NONE Performed at [facility], [address], [place], [place] [address]    Culture (A)  Final    <10,000 COLONIES/mL INSIGNIFICANT GROWTH Performed at [facility], [address], [place], [place] [address]    Report Status 06/14/2024 FINAL  Final  Culture, blood (Routine X 2) w Reflex to ID Panel     Status: Abnormal   Collection Time: 06/14/24  7:44 AM   Specimen: BLOOD  Result Value Ref Range Status   Specimen Description   Final    BLOOD RIGHT ANTECUBITAL Performed at [facility], [address] [place], [place] [address]    Special Requests   Final    BOTTLES DRAWN AEROBIC AND ANAEROBIC Blood Culture adequate volume Performed at [facility], [address], [place], [place] [address]    Culture  Setup Time   Final    GRAM POSITIVE COCCI IN BOTH AEROBIC AND ANAEROBIC BOTTLES Gram Stain Report Called to,Read Back By and Verified With: DSABRA MOLT ON 06/15/2024 @12 :50 BY T.HAMER Performed at [facility], [address], [place], [place] [address]    Culture (A)  Final    STAPHYLOCOCCUS EPIDERMIDIS SUSCEPTIBILITIES PERFORMED ON PREVIOUS CULTURE WITHIN THE LAST 5 DAYS. Performed at [facility], [address], [place], [place] [address]    Report Status 06/17/2024 FINAL  Final  Culture, blood (Routine X 2) w Reflex to ID Panel     Status:  Abnormal   Collection Time: 06/14/24  7:44 AM   Specimen: BLOOD  Result Value Ref Range Status   Specimen Description   Final    BLOOD LEFT ANTECUBITAL Performed at [facility], [address] [place], [place] [address]    Special Requests   Final    BOTTLES DRAWN AEROBIC AND ANAEROBIC Blood Culture adequate volume Performed at [facility], [address], [place], [place] [address]    Culture  Setup Time   Final    GRAM POSITIVE COCCI IN BOTH AEROBIC AND ANAEROBIC BOTTLES Gram Stain Report Called to,Read Back By and Verified With: B. ROBERTSON ON 06/15/2024 @13 :49 BY T.HAMER Performed at [facility], [address], [place], [place] [address]    Culture (A)  Final    STAPHYLOCOCCUS EPIDERMIDIS SUSCEPTIBILITIES PERFORMED ON PREVIOUS CULTURE WITHIN THE LAST 5 DAYS. Performed at [facility], [address], [place], [place] [address]    Report Status 06/17/2024 FINAL  Final  Culture, blood (Routine X 2) w Reflex to ID Panel     Status: None   Collection Time: 06/16/24  2:30 AM   Specimen: BLOOD LEFT ARM  Result Value Ref Range Status   Specimen Description BLOOD LEFT ARM  Final   Special Requests AEROBIC BOTTLE ONLY Blood Culture adequate volume  Final   Culture   Final    NO GROWTH 5 DAYS Performed at [facility], [address], [place], [place] [address]    Report Status 06/21/2024 FINAL  Final  Culture, blood (Routine X 2) w Reflex to ID Panel     Status: None   Collection Time: 06/16/24  2:35 AM   Specimen: Right Antecubital; Blood  Result Value Ref Range Status   Specimen Description RIGHT ANTECUBITAL  Final   Special Requests   Final    BOTTLES DRAWN AEROBIC AND ANAEROBIC Blood Culture adequate volume   Culture   Final    NO GROWTH  5 DAYS Performed at [facility], [address], [place], [place] [address]    Report Status 06/21/2024 FINAL  Final         Radiology Studies: No results found.         LOS: 8 days   Time spent= 35  mins    Burgess JAYSON Dare, MD [facility]  If 7PM-7AM, please contact night-coverage  06/23/2024, 9:56 AM

## 2024-06-24 DIAGNOSIS — T826XXA Infection and inflammatory reaction due to cardiac valve prosthesis, initial encounter: Secondary | ICD-10-CM | POA: Diagnosis not present

## 2024-06-24 DIAGNOSIS — I330 Acute and subacute infective endocarditis: Secondary | ICD-10-CM | POA: Diagnosis not present

## 2024-06-24 LAB — CBC
HCT: 28.3 % — ABNORMAL LOW (ref 39.0–52.0)
Hemoglobin: 9.4 g/dL — ABNORMAL LOW (ref 13.0–17.0)
MCH: 29.5 pg (ref 26.0–34.0)
MCHC: 33.2 g/dL (ref 30.0–36.0)
MCV: 88.7 fL (ref 80.0–100.0)
Platelets: 203 K/uL (ref 150–400)
RBC: 3.19 MIL/uL — ABNORMAL LOW (ref 4.22–5.81)
RDW: 13.7 % (ref 11.5–15.5)
WBC: 11.7 K/uL — ABNORMAL HIGH (ref 4.0–10.5)
nRBC: 0 % (ref 0.0–0.2)

## 2024-06-24 LAB — BASIC METABOLIC PANEL WITH GFR
Anion gap: 9 (ref 5–15)
BUN: 12 mg/dL (ref 8–23)
CO2: 23 mmol/L (ref 22–32)
Calcium: 8.2 mg/dL — ABNORMAL LOW (ref 8.9–10.3)
Chloride: 104 mmol/L (ref 98–111)
Creatinine, Ser: 0.88 mg/dL (ref 0.61–1.24)
GFR, Estimated: >60 mL/min (ref >60)
Glucose, Bld: 100 mg/dL — ABNORMAL HIGH (ref 70–99)
Potassium: 3.1 mmol/L — ABNORMAL LOW (ref 3.5–5.1)
Sodium: 136 mmol/L (ref 135–145)

## 2024-06-24 LAB — MAGNESIUM: Magnesium: 2 mg/dL (ref 1.7–2.4)

## 2024-06-24 MED ORDER — POTASSIUM CHLORIDE CRYS ER 20 MEQ PO TBCR
40.0000 meq | EXTENDED_RELEASE_TABLET | Freq: Once | ORAL | Status: AC
  Administered 2024-06-24: 40 meq via ORAL
  Filled 2024-06-24: qty 2

## 2024-06-24 MED ORDER — HEPARIN SODIUM (PORCINE) 5000 UNIT/ML IJ SOLN
5000.0000 [IU] | Freq: Three times a day (TID) | INTRAMUSCULAR | Status: DC
  Administered 2024-06-24 – 2024-06-26 (×7): 5000 [IU] via SUBCUTANEOUS
  Filled 2024-06-24 (×6): qty 1

## 2024-06-24 NOTE — TOC Progression Note (Signed)
 Transition of Care [facility]) - Progression Note    Patient Details  Name: Kyle Matthews MRN: [id number] Date of Birth: April 12, 1946  Transition of Care [facility]) CM/SW Contact  Graves-Bigelow, Erminio Deems, RN Phone Number: 06/24/2024, 3:30 PM  Clinical Narrative:  Patient was discussed in progression rounds this morning. Per MD notes, plan for redo AVR on 06-26-24.  Case Manager will continue to follow for additional needs as the patient progresses.   Expected Discharge Plan: Home/Self Care Barriers to Discharge: Continued Medical Work up  Expected Discharge Plan and Services In-house Referral: NA Discharge Planning Services: CM Consult Post Acute Care Choice: NA Living arrangements for the past 2 months: Single Family Home     HH Arranged: NA   Social Determinants of Health (SDOH) Interventions SDOH Screenings   Food Insecurity: No Food Insecurity (06/12/2024)  Recent Concern: Food Insecurity - Food Insecurity Present (05/22/2024)  Housing: Low Risk  (06/12/2024)  Transportation Needs: No Transportation Needs (06/12/2024)  Utilities: Not At Risk (06/12/2024)  Depression (PHQ2-9): High Risk (02/12/2024)  Social Connections: Moderately Integrated (06/12/2024)  Tobacco Use: High Risk (06/12/2024)    Readmission Risk Interventions    11/08/2023   10:21 AM 10/25/2023    3:42 PM  Readmission Risk Prevention Plan  Transportation Screening Complete Complete  PCP or Specialist Appt within 3-5 Days  Complete  HRI or Home Care Consult  Complete  Palliative Care Screening  Not Applicable  Medication Review (RN Care Manager)  Complete

## 2024-06-24 NOTE — Progress Notes (Signed)
 Pt has been ambulating. Discussed IS (currently 1750 ml), sternal precautions, mobility post op, and d/c planning. Wife is supportive. Gave OHS booklet. Encouraged continued ambulation.  [id number] Aliene Aris BS, ACSM-CEP 06/24/2024 3:04 PM

## 2024-06-24 NOTE — Progress Notes (Addendum)
 Progress Note  Patient Name: Kyle Matthews Date of Encounter: 06/24/2024 [facility] Cardiologist: Lurena MARLA Red, MD   Interval Summary   78 y.o. male with a hx of CAD (s/p CABG in 10/2023 with LIMA-LAD and SVG-Ramus at the time of AVR), severe AS (s/p AVR with 23 mm Edwards Inspiris Resilia pericardial valve in 10/2023), post-op atrial fibrillation following CABG and AVR, HTN, HLD, history of CVA, COPD, tobacco use, anemia and BPH s/p robot-assisted prostatectomy 05/22/24 , who is admitted for bioprosthetic aortic valve endocarditis and tricuspid valve endocarditis, noted on TEE 06/18/24.   Patient states he has been feeling well, no further chest pain since admission, denied any palpitation, SOB, leg edema. He is waiting for surgery and states he maybe moved up to Wednesday by CT surgery team. He offers no questions.   Vital Signs Vitals:   06/23/24 1542 06/23/24 2050 06/23/24 2333 06/24/24 0753  BP:  124/82 (!) 140/71 131/66  Pulse:   78 77  Resp: 18 16 18 18   Temp: 98.3 F (36.8 C) 99.1 F (37.3 C) 98.4 F (36.9 C) 98.4 F (36.9 C)  TempSrc: Oral Oral Oral Oral  SpO2:  98% 96% 96%  Weight:      Height:        Intake/Output Summary (Last 24 hours) at 06/24/2024 0927 Last data filed at 06/24/2024 0755 Gross per 24 hour  Intake 1230 ml  Output --  Net 1230 ml      06/20/2024    6:16 AM 06/12/2024   11:30 PM 06/12/2024    4:57 PM  Last 3 Weights  Weight (lbs) 152 lb 1.9 oz 153 lb 3.5 oz 156 lb 8.4 oz  Weight (kg) 69 kg 69.5 kg 71 kg      Telemetry/ECG  Sinus rhythm 60-70s, first degree AVB - Personally Reviewed  Physical Exam  GEN: No acute distress.   Neck: No JVD Cardiac: RRR, soft systolic murmur  Respiratory: Clear to auscultation bilaterally. GI: Soft, nontender, non-distended  MS: No leg edema  Assessment & Plan   Prosthetic aortic valve and tricuspid  endocarditis  - presented with chills, chest pain, SOB initially  - blood culture at admission + MRSE,  last blood culture from 06/16/24 NTD  - TEE 06/18/24 showed LVEF 65-70%, Mobile density on TV measuring 1.55cm x 0.447cm, moderate TR, Mobile mass measuring 1.15cm x 1.29cm with a 1.66 cm mobile tail suggestive of bioprosthetic aortic valve endocarditis, trivial AI, Aortic valve mean gradient measures 20.0 mmHg. Aortic valve Vmax measures 2.90 m/s.  - ID following, continued on IV vancomycin   - CT surgery consulted, patient is planned for re-do AVR  06/28/24 - clinically compensated, no signs of CHF, on PTA PRN lasix    Chest pain Elevated troponin  CAD with hx of CABG with LIMA-LAD and SVG-Ramus 10/2023 - Hs trop 99 >86 - Heart cath 06/20/24 with patent grafts, non-obstructive RCA disease  - likely demand ischemia, no further chest pain  - continue PTA ASA 81mg , crestor  40mg    Acute CVA - MRI of brain showed small acute infarct in the left occipital lobe.  - Etiology suspect septic emboli  - on ASA per primary team   BPH s/p laparoscopic prostatectomy on 05/22/2024 HTN HLD COPD Tobacco use - Per IM    For questions or updates, please contact [facility] Please consult [email or web address] for contact info under       Signed, Xika Zhao, NP   Patient seen and examined with Xika Zhao  NP.  Agree as above, with the following exceptions and changes as noted below. Feels well with no CP or SOB. Gen: NAD, CV: RRR, soft systolic murmur, Lungs: clear, Abd: soft, Extrem: Warm, well perfused, no edema, Neuro/Psych: alert and oriented x 3, normal mood and affect. All available labs, radiology testing, previous records reviewed. Anticipating surgery for bioprosthetic AV endocarditis with TV endocarditis. No high grade AVB on tele. Stable overall.   Markeita Alicia A Cletus Mehlhoff, MD 06/24/24 11:43 AM

## 2024-06-24 NOTE — Care Management Important Message (Signed)
 Important Message  Patient Details  Name: Kyle Matthews MRN: [id number] Date of Birth: January 25, 1946   Important Message Given:  Yes - Medicare IM     Jon Cruel 06/24/2024, 12:33 PM

## 2024-06-24 NOTE — Progress Notes (Signed)
 PROGRESS NOTE    Kyle Matthews  FMW:[id number] DOB: 1946/10/26 DOA: 06/12/2024 PCP: Verena Mems, MD    Brief Narrative:  78 year old with history of CAD status post CABG 10/2023, AAS status post bioprosthetic AVR, diastolic CHF, CVA, postop A-fib, HTN, HLD, BPH with urinary retention requiring robotic simple prostatectomy 6//25, HTN, HLD presented with chest pain and rigors on 6/25.  Blood cultures positive for MRSE, MRI brain showed concerns of possible embolic stroke.  Echocardiogram negative for endocarditis but TEE showed large mobile density on tricuspid and prosthetic valve.  CT surgery and cardiology team consulted. LHC does not show any new coronary disease, grafts are patent.  CT surgery planning intervention later this week.    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  TEE shows concerns of endocarditis.  CT surgery, ID and cardiology team is following.  MRI is also positive for embolic CVA -Initial blood culture 6/27-positive for MRSE -Repeat blood cultures 6/29-NGTD - LHC does not show any new coronary disease, grafts are patent.  CT surgery planning intervention next week  Acute CVA, likely embolic - MRI brain is positive for acute CVA.  Echocardiogram shows EF 50%, moderate TR.  LDL 66, A1c 5.1.   Neurology recommended aspirin .  History of BPH - Status post laparoscopic prostatectomy on 6/4, Foley catheter was removed on 6/16.  Currently able to urinate.  Continue finasteride   Hyperlipidemia - Statin  Essential hypertension - IV as needed  Hypokalemia - As needed repletion   DVT prophylaxis: SQ Hep    Code Status: Full Code Family Communication:   Status is: Inpatient Ongoing management for endocarditis.    Subjective:  No complaints.  Examination:  General exam: Appears calm and comfortable  Respiratory system: Clear to auscultation. Respiratory effort normal. Cardiovascular system: S1 & S2 heard, RRR. No JVD, murmurs, rubs, gallops or clicks. No pedal edema. Gastrointestinal system: Abdomen is nondistended, soft and nontender. No organomegaly or masses felt. Normal bowel sounds heard. Central nervous system: Alert and oriented. No focal neurological deficits. Extremities: Symmetric 5 x 5 power. Skin: No specific facial rash, pruritus Psychiatry: Judgement and insight appear normal. Mood & affect appropriate.                Diet Orders (From admission, onward)     Start     Ordered   06/20/24 1237  Diet Heart Room service appropriate? Yes; Fluid consistency: Thin  Diet effective now       Question Answer Comment  Room service appropriate? Yes   Fluid consistency: Thin      06/20/24 1236            Objective: Vitals:   06/23/24 1542 06/23/24 2050 06/23/24 2333 06/24/24 0753  BP:  124/82 (!) 140/71 131/66  Pulse:   78 77  Resp: 18 16  18 18  Temp: 98.3 F (36.8 C) 99.1 F (37.3 C) 98.4 F (36.9 C) 98.4 F (36.9 C)  TempSrc: Oral Oral Oral Oral  SpO2:  98% 96% 96%  Weight:      Height:        Intake/Output Summary (Last 24 hours) at 06/24/2024 1051 Last data filed at 06/24/2024 0755 Gross per 24 hour  Intake 1230 ml  Output --  Net 1230 ml   Filed Weights   06/12/24 1657 06/12/24 2330 06/20/24 0616  Weight: 71 kg 69.5 kg 69  kg    Scheduled Meds:  aspirin   81 mg Oral Daily   famotidine   20 mg Oral BID   finasteride   5 mg Oral QPM   heparin  injection (subcutaneous)  5,000 Units Subcutaneous Q8H   pantoprazole  (PROTONIX ) IV  40 mg Intravenous Q24H   potassium chloride   40 mEq Oral Once   rosuvastatin   40 mg Oral Daily   Continuous Infusions:  vancomycin  1,250 mg (06/23/24 2325)    Nutritional status     Body mass index is 27.82 kg/m.  Data Reviewed:   CBC: Recent Labs  Lab 06/20/24 0406 06/20/24 1018 06/20/24 1023 06/24/24 0245  WBC 12.5*  --   --  11.7*  HGB 9.7* 9.2* 8.8* 9.4*  HCT 29.1* 27.0* 26.0* 28.3*  MCV 90.7  --   --  88.7  PLT 189  --   --  203   Basic Metabolic Panel: Recent Labs  Lab 06/18/24 0441 06/19/24 0429 06/20/24 0406 06/20/24 1018 06/20/24 1023 06/21/24 0424 06/24/24 0245  NA 134* 134* 136 137 139  --  136  K 3.3* 3.5 3.6 3.8 3.4*  --  3.1*  CL 102 103 103  --   --   --  104  CO2 24 24 24   --   --   --  23  GLUCOSE 91 113* 104*  --   --   --  100*  BUN 9 10 10   --   --   --  12  CREATININE 0.62 0.65 0.91  --   --  0.90 0.88  CALCIUM  8.3* 8.3* 8.6*  --   --   --  8.2*  MG  --   --   --   --   --   --  2.0   GFR: Estimated Creatinine Clearance: 62 mL/min (by C-G formula based on SCr of 0.88 mg/dL). Liver Function Tests: No results for input(s): AST, ALT, ALKPHOS, BILITOT, PROT, ALBUMIN  in the last 168 hours. No results for input(s): LIPASE, AMYLASE in the last 168 hours. No results for input(s): AMMONIA in the last 168 hours. Coagulation Profile: No results for input(s): INR, PROTIME in the last 168 hours. Cardiac Enzymes: No results for input(s): CKTOTAL, CKMB, CKMBINDEX, TROPONINI in the last 168 hours. BNP (last 3 results) No results for input(s): PROBNP in the last 8760 hours. HbA1C: No results for input(s): HGBA1C in the last 72 hours. CBG: Recent Labs  Lab 06/18/24 2129  GLUCAP 122*   Lipid Profile: No  results for input(s): CHOL, HDL, LDLCALC, TRIG, CHOLHDL, LDLDIRECT in the last 72 hours. Thyroid  Function Tests: No results for input(s): TSH, T4TOTAL, FREET4, T3FREE, THYROIDAB in the last 72 hours. Anemia Panel: No results for input(s): VITAMINB12, FOLATE, FERRITIN, TIBC, IRON , RETICCTPCT in the last 72 hours. Sepsis Labs: No results for input(s): PROCALCITON, LATICACIDVEN in the last 168 hours.  Recent Results (from the past 240 hours)  Culture, blood (Routine X  2) w Reflex to ID Panel     Status: None   Collection Time: 06/16/24  2:30 AM   Specimen: BLOOD LEFT ARM  Result Value Ref Range Status   Specimen Description BLOOD LEFT ARM  Final   Special Requests AEROBIC BOTTLE ONLY Blood Culture adequate volume  Final   Culture   Final    NO GROWTH 5 DAYS Performed at [facility], [address] [place], [place] [address]    Report Status 06/21/2024 FINAL  Final  Culture, blood (Routine X 2) w Reflex to ID Panel     Status: None   Collection Time: 06/16/24  2:35 AM   Specimen: Right Antecubital; Blood  Result Value Ref Range Status   Specimen Description RIGHT ANTECUBITAL  Final   Special Requests   Final    BOTTLES DRAWN AEROBIC AND ANAEROBIC Blood Culture adequate volume   Culture   Final    NO GROWTH 5 DAYS Performed at [facility], [address], [place], [place] [address]    Report Status 06/21/2024 FINAL  Final         Radiology Studies: No results found.         LOS: 9 days   Time spent= 35 mins    Burgess JAYSON Dare, MD [facility]  If 7PM-7AM, please contact night-coverage  06/24/2024, 10:51 AM

## 2024-06-25 DIAGNOSIS — I330 Acute and subacute infective endocarditis: Secondary | ICD-10-CM | POA: Diagnosis not present

## 2024-06-25 DIAGNOSIS — R7881 Bacteremia: Secondary | ICD-10-CM | POA: Diagnosis not present

## 2024-06-25 DIAGNOSIS — Z952 Presence of prosthetic heart valve: Secondary | ICD-10-CM | POA: Diagnosis not present

## 2024-06-25 DIAGNOSIS — T826XXA Infection and inflammatory reaction due to cardiac valve prosthesis, initial encounter: Secondary | ICD-10-CM | POA: Diagnosis not present

## 2024-06-25 LAB — URINALYSIS, ROUTINE W REFLEX MICROSCOPIC
Bilirubin Urine: NEGATIVE
Glucose, UA: NEGATIVE mg/dL
Ketones, ur: NEGATIVE mg/dL
Nitrite: NEGATIVE
Protein, ur: 30 mg/dL — AB
Specific Gravity, Urine: 1.017 (ref 1.005–1.030)
pH: 5 (ref 5.0–8.0)

## 2024-06-25 LAB — VANCOMYCIN, TROUGH: Vancomycin Tr: 26 ug/mL (ref 15–20)

## 2024-06-25 LAB — SARS CORONAVIRUS 2 BY RT PCR: SARS Coronavirus 2 by RT PCR: NEGATIVE

## 2024-06-25 LAB — PREPARE RBC (CROSSMATCH)

## 2024-06-25 LAB — SURGICAL PCR SCREEN
MRSA, PCR: NEGATIVE
Staphylococcus aureus: NEGATIVE

## 2024-06-25 MED ORDER — VANCOMYCIN HCL IN DEXTROSE 1-5 GM/200ML-% IV SOLN
1000.0000 mg | Freq: Two times a day (BID) | INTRAVENOUS | Status: DC
  Administered 2024-06-25: 1000 mg via INTRAVENOUS
  Filled 2024-06-25 (×3): qty 200

## 2024-06-25 MED ORDER — DIAZEPAM 5 MG PO TABS
5.0000 mg | ORAL_TABLET | Freq: Once | ORAL | Status: AC
  Administered 2024-06-26: 5 mg via ORAL
  Filled 2024-06-25: qty 1

## 2024-06-25 MED ORDER — BISACODYL 5 MG PO TBEC
5.0000 mg | DELAYED_RELEASE_TABLET | Freq: Once | ORAL | Status: AC
  Administered 2024-06-25: 5 mg via ORAL
  Filled 2024-06-25: qty 1

## 2024-06-25 MED ORDER — METOPROLOL TARTRATE 12.5 MG HALF TABLET
12.5000 mg | ORAL_TABLET | Freq: Once | ORAL | Status: AC
  Administered 2024-06-26: 12.5 mg via ORAL
  Filled 2024-06-25: qty 1

## 2024-06-25 MED ORDER — MANNITOL 20 % IV SOLN
INTRAVENOUS | Status: DC
  Filled 2024-06-25: qty 13

## 2024-06-25 MED ORDER — TRANEXAMIC ACID (OHS) BOLUS VIA INFUSION
15.0000 mg/kg | INTRAVENOUS | Status: AC
  Administered 2024-06-26: 1035 mg via INTRAVENOUS
  Filled 2024-06-25: qty 1035

## 2024-06-25 MED ORDER — NOREPINEPHRINE 4 MG/250ML-% IV SOLN
0.0000 ug/min | INTRAVENOUS | Status: DC
  Filled 2024-06-25: qty 250

## 2024-06-25 MED ORDER — HEPARIN 30,000 UNITS/1000 ML (OHS) CELLSAVER SOLUTION
Status: DC
  Filled 2024-06-25: qty 1000

## 2024-06-25 MED ORDER — CHLORHEXIDINE GLUCONATE CLOTH 2 % EX PADS
6.0000 | MEDICATED_PAD | Freq: Once | CUTANEOUS | Status: AC
  Administered 2024-06-25: 6 via TOPICAL

## 2024-06-25 MED ORDER — CEFAZOLIN SODIUM-DEXTROSE 2-4 GM/100ML-% IV SOLN
2.0000 g | INTRAVENOUS | Status: AC
  Administered 2024-06-26 (×2): 2 g via INTRAVENOUS
  Filled 2024-06-25: qty 100

## 2024-06-25 MED ORDER — TEMAZEPAM 7.5 MG PO CAPS: 15.0000 mg | ORAL_CAPSULE | Freq: Once | ORAL | Status: DC | PRN

## 2024-06-25 MED ORDER — EPINEPHRINE HCL 5 MG/250ML IV SOLN IN NS
0.0000 ug/min | INTRAVENOUS | Status: AC
  Administered 2024-06-26: 3 ug/min via INTRAVENOUS
  Filled 2024-06-25: qty 250

## 2024-06-25 MED ORDER — CHLORHEXIDINE GLUCONATE CLOTH 2 % EX PADS
6.0000 | MEDICATED_PAD | Freq: Once | CUTANEOUS | Status: AC
  Administered 2024-06-26: 6 via TOPICAL

## 2024-06-25 MED ORDER — CEFAZOLIN SODIUM-DEXTROSE 2-4 GM/100ML-% IV SOLN
2.0000 g | INTRAVENOUS | Status: DC
  Filled 2024-06-25: qty 100

## 2024-06-25 MED ORDER — POTASSIUM CHLORIDE 2 MEQ/ML IV SOLN
80.0000 meq | INTRAVENOUS | Status: DC
  Filled 2024-06-25: qty 40

## 2024-06-25 MED ORDER — INSULIN REGULAR(HUMAN) IN NACL 100-0.9 UT/100ML-% IV SOLN
INTRAVENOUS | Status: AC
  Administered 2024-06-26: 1.4 [IU]/h via INTRAVENOUS
  Filled 2024-06-25: qty 100

## 2024-06-25 MED ORDER — PLASMA-LYTE A IV SOLN
INTRAVENOUS | Status: DC
  Filled 2024-06-25: qty 2.5

## 2024-06-25 MED ORDER — PHENYLEPHRINE HCL-NACL 20-0.9 MG/250ML-% IV SOLN
30.0000 ug/min | INTRAVENOUS | Status: AC
  Administered 2024-06-26: 30 ug/min via INTRAVENOUS
  Filled 2024-06-25: qty 250

## 2024-06-25 MED ORDER — DEXMEDETOMIDINE HCL IN NACL 400 MCG/100ML IV SOLN
0.1000 ug/kg/h | INTRAVENOUS | Status: AC
  Administered 2024-06-26: .7 ug/kg/h via INTRAVENOUS
  Filled 2024-06-25: qty 100

## 2024-06-25 MED ORDER — TRANEXAMIC ACID 1000 MG/10ML IV SOLN
1.5000 mg/kg/h | INTRAVENOUS | Status: AC
  Administered 2024-06-26: 1.5 mg/kg/h via INTRAVENOUS
  Filled 2024-06-25: qty 25

## 2024-06-25 MED ORDER — VANCOMYCIN HCL IN DEXTROSE 1-5 GM/200ML-% IV SOLN
1000.0000 mg | INTRAVENOUS | Status: AC
  Administered 2024-06-26: 1000 mg via INTRAVENOUS
  Filled 2024-06-25: qty 200

## 2024-06-25 MED ORDER — NITROGLYCERIN IN D5W 200-5 MCG/ML-% IV SOLN
2.0000 ug/min | INTRAVENOUS | Status: AC
  Administered 2024-06-26: 5 ug/min via INTRAVENOUS
  Filled 2024-06-25: qty 250

## 2024-06-25 MED ORDER — CHLORHEXIDINE GLUCONATE 0.12 % MT SOLN
15.0000 mL | Freq: Once | OROMUCOSAL | Status: AC
  Administered 2024-06-26: 15 mL via OROMUCOSAL
  Filled 2024-06-25: qty 15

## 2024-06-25 MED ORDER — TRANEXAMIC ACID (OHS) PUMP PRIME SOLUTION
2.0000 mg/kg | INTRAVENOUS | Status: DC
  Filled 2024-06-25: qty 1.38

## 2024-06-25 MED ORDER — MILRINONE LACTATE IN DEXTROSE 20-5 MG/100ML-% IV SOLN
0.3000 ug/kg/min | INTRAVENOUS | Status: DC
  Filled 2024-06-25: qty 100

## 2024-06-25 MED ORDER — BISACODYL 10 MG RE SUPP
10.0000 mg | Freq: Every day | RECTAL | Status: DC | PRN
  Administered 2024-06-25: 10 mg via RECTAL
  Filled 2024-06-25: qty 1

## 2024-06-25 NOTE — Progress Notes (Signed)
 Subjective: No new complaints    Antibiotics:  Anti-infectives (From admission, onward)    Start     Dose/Rate Route Frequency Ordered Stop   06/26/24 0400  vancomycin  (VANCOCIN ) IVPB 1000 mg/200 mL premix        1,000 mg 200 mL/hr over 60 Minutes Intravenous To Surgery 06/25/24 1322 06/27/24 0400   06/26/24 0400  ceFAZolin  (ANCEF ) IVPB 2g/100 mL premix        2 g 200 mL/hr over 30 Minutes Intravenous To Surgery 06/25/24 1322 06/27/24 0400   06/26/24 0400  ceFAZolin  (ANCEF ) IVPB 2g/100 mL premix        2 g 200 mL/hr over 30 Minutes Intravenous To Surgery 06/25/24 1322 06/27/24 0400   06/25/24 2300  vancomycin  (VANCOCIN ) IVPB 1000 mg/200 mL premix        1,000 mg 200 mL/hr over 60 Minutes Intravenous Every 12 hours 06/25/24 1316     06/22/24 2300  vancomycin  (VANCOREADY) IVPB 1250 mg/250 mL  Status:  Discontinued        1,250 mg 166.7 mL/hr over 90 Minutes Intravenous Every 12 hours 06/22/24 1141 06/25/24 1316   06/19/24 2200  vancomycin  (VANCOREADY) IVPB 1500 mg/300 mL  Status:  Discontinued        1,500 mg 150 mL/hr over 120 Minutes Intravenous Every 12 hours 06/19/24 1246 06/22/24 1141   06/18/24 0000  vancomycin  (VANCOCIN ) IVPB 1000 mg/200 mL premix  Status:  Discontinued        1,000 mg 200 mL/hr over 60 Minutes Intravenous Every 12 hours 06/17/24 1115 06/19/24 1246   06/15/24 1000  vancomycin  (VANCOREADY) IVPB 1250 mg/250 mL  Status:  Discontinued        1,250 mg 166.7 mL/hr over 90 Minutes Intravenous Every 24 hours 06/14/24 1029 06/17/24 1115   06/14/24 0845  vancomycin  (VANCOREADY) IVPB 1500 mg/300 mL        1,500 mg 150 mL/hr over 120 Minutes Intravenous  Once 06/14/24 0747 06/14/24 1133   06/13/24 0000  ciprofloxacin  (CIPRO ) tablet 500 mg  Status:  Discontinued        500 mg Oral 2 times daily 06/12/24 2311 06/13/24 0854       Medications: Scheduled Meds:  aspirin   81 mg Oral Daily   bisacodyl   5 mg Oral Once   [START ON 06/26/2024] chlorhexidine   15  mL Mouth/Throat Once   Chlorhexidine  Gluconate Cloth  6 each Topical Once   And   [START ON 06/26/2024] Chlorhexidine  Gluconate Cloth  6 each Topical Once   [START ON 06/26/2024] diazepam   5 mg Oral Once   [START ON 06/26/2024] epinephrine   0-10 mcg/min Intravenous To OR   finasteride   5 mg Oral QPM   heparin  injection (subcutaneous)  5,000 Units Subcutaneous Q8H   [START ON 06/26/2024] heparin  sodium (porcine) 2,500 Units, papaverine  30 mg in electrolyte-A (PLASMALYTE-A PH 7.4) 500 mL irrigation   Irrigation To OR   [START ON 06/26/2024] insulin    Intravenous To OR   [START ON 06/26/2024] [facility] Blood Cardioplegia vial (lidocaine /magnesium /mannitol  0.26g-4g-6.4g)   Intracoronary To OR   [START ON 06/26/2024] metoprolol  tartrate  12.5 mg Oral Once   pantoprazole  (PROTONIX ) IV  40 mg Intravenous Q24H   [START ON 06/26/2024] phenylephrine   30-200 mcg/min Intravenous To OR   [START ON 06/26/2024] potassium chloride   80 mEq Other To OR   rosuvastatin   40 mg Oral Daily   [START ON 06/26/2024] tranexamic acid   15 mg/kg Intravenous To OR   [  START ON 06/26/2024] tranexamic acid   2 mg/kg Intracatheter To OR   Continuous Infusions:  [START ON 06/26/2024]  ceFAZolin  (ANCEF ) IV     [START ON 06/26/2024]  ceFAZolin  (ANCEF ) IV     [START ON 06/26/2024] dexmedetomidine      [START ON 06/26/2024] heparin  30,000 units/NS 1000 mL solution for CELLSAVER     [START ON 06/26/2024] milrinone      [START ON 06/26/2024] nitroGLYCERIN      [START ON 06/26/2024] norepinephrine      [START ON 06/26/2024] tranexamic acid  (CYKLOKAPRON ) 2,500 mg in sodium chloride  0.9 % 250 mL (10 mg/mL) infusion     vancomycin      [START ON 06/26/2024] vancomycin      PRN Meds:.acetaminophen  **OR** acetaminophen , bisacodyl , diphenhydrAMINE , diphenhydrAMINE -zinc  acetate, furosemide , ondansetron  **OR** ondansetron  (ZOFRAN ) IV, polyethylene glycol, sodium chloride  flush, temazepam     Objective: Weight change:   Intake/Output Summary (Last 24 hours) at 06/25/2024  1636 Last data filed at 06/25/2024 1154 Gross per 24 hour  Intake 732.37 ml  Output 300 ml  Net 432.37 ml   Blood pressure (!) 153/86, pulse 95, temperature 98.5 F (36.9 C), temperature source Oral, resp. rate 17, height 5' 2 (1.575 m), weight 69 kg, SpO2 99%. Temp:  [98 F (36.7 C)-98.7 F (37.1 C)] 98.5 F (36.9 C) (07/08 1618) Pulse Rate:  [77-95] 95 (07/08 1618) Resp:  [15-18] 17 (07/08 1618) BP: (123-159)/(71-91) 153/86 (07/08 1618) SpO2:  [96 %-99 %] 99 % (07/08 1618)  Physical Exam: Physical Exam Constitutional:      Appearance: He is well-developed.  HENT:     Head: Normocephalic and atraumatic.  Eyes:     Conjunctiva/sclera: Conjunctivae normal.  Cardiovascular:     Rate and Rhythm: Normal rate and regular rhythm.     Heart sounds: Murmur heard.  Pulmonary:     Effort: Pulmonary effort is normal. No respiratory distress.     Breath sounds: Normal breath sounds. No stridor. No wheezing.  Abdominal:     General: There is no distension.     Palpations: Abdomen is soft.  Musculoskeletal:        General: Normal range of motion.     Cervical back: Normal range of motion and neck supple.  Skin:    General: Skin is warm and dry.     Findings: No erythema or rash.  Neurological:     General: No focal deficit present.     Mental Status: He is alert and oriented to person, place, and time.  Psychiatric:        Mood and Affect: Mood normal.        Behavior: Behavior normal.        Thought Content: Thought content normal.        Judgment: Judgment normal.      CBC:    BMET Recent Labs    06/24/24 0245  NA 136  K 3.1*  CL 104  CO2 23  GLUCOSE 100*  BUN 12  CREATININE 0.88  CALCIUM  8.2*     Liver Panel  No results for input(s): PROT, ALBUMIN , AST, ALT, ALKPHOS, BILITOT, BILIDIR, IBILI in the last 72 hours.     Sedimentation Rate No results for input(s): ESRSEDRATE in the last 72 hours. C-Reactive Protein No results for  input(s): CRP in the last 72 hours.  Micro Results: Recent Results (from the past 720 hours)  Culture, blood (Routine X 2) w Reflex to ID Panel     Status: Abnormal   Collection Time: 06/12/24 10:55 PM  Specimen: BLOOD  Result Value Ref Range Status   Specimen Description   Final    BLOOD BLOOD LEFT ARM Performed at [facility], [address] [place], [place] [address]    Special Requests   Final    BOTTLES DRAWN AEROBIC AND ANAEROBIC Blood Culture adequate volume Performed at [facility], [address] [place], [place] [address]    Culture  Setup Time   Final    GRAM POSITIVE COCCI IN BOTH AEROBIC AND ANAEROBIC BOTTLES CRITICAL VALUE NOTED.  VALUE IS CONSISTENT WITH PREVIOUSLY REPORTED AND CALLED VALUE. JESSICA ELLER AT 2315 06/13/24 BY A. SNYDER GRAM STAIN REVIEWED-AGREE WITH RESULT    Culture (A)  Final    STAPHYLOCOCCUS EPIDERMIDIS SUSCEPTIBILITIES PERFORMED ON PREVIOUS CULTURE WITHIN THE LAST 5 DAYS. Performed at [facility], [address], [place], [place] [address]    Report Status 06/17/2024 FINAL  Final  Culture, blood (Routine X 2) w Reflex to ID Panel     Status: Abnormal   Collection Time: 06/12/24 10:57 PM   Specimen: BLOOD LEFT HAND  Result Value Ref Range Status   Specimen Description   Final    BLOOD LEFT HAND Performed at [facility], [address], [place], [place] [address]    Special Requests   Final    BOTTLES DRAWN AEROBIC AND ANAEROBIC Blood Culture adequate volume Performed at [facility], [address], [place], [place] [address]    Culture  Setup Time   Final    GRAM POSITIVE COCCI IN BOTH AEROBIC AND ANAEROBIC BOTTLES Gram Stain Report Called to,Read Back By and Verified With: JESSICA ELLER,RN AT 2315 06/13/24 BY A. SNYDER CRITICAL RESULT CALLED TO, READ BACK BY AND VERIFIED WITH: LOISE MORONES RN 06/14/2024 @ 0414 BY AB GRAM STAIN REVIEWED-AGREE WITH RESULT Performed at [facility], [address], [place], [place]  [address]    Culture STAPHYLOCOCCUS EPIDERMIDIS (A)  Final   Report Status 06/17/2024 FINAL  Final   Organism ID, Bacteria STAPHYLOCOCCUS EPIDERMIDIS  Final      Susceptibility   Staphylococcus epidermidis - MIC*    CIPROFLOXACIN  >=8 RESISTANT Resistant     ERYTHROMYCIN >=8 RESISTANT Resistant     GENTAMICIN <=0.5 SENSITIVE Sensitive     OXACILLIN >=4 RESISTANT Resistant     TETRACYCLINE >=16 RESISTANT Resistant     VANCOMYCIN  <=0.5 SENSITIVE Sensitive     TRIMETH/SULFA 160 RESISTANT Resistant     CLINDAMYCIN >=8 RESISTANT Resistant     RIFAMPIN <=0.5 SENSITIVE Sensitive     Inducible Clindamycin NEGATIVE Sensitive     * STAPHYLOCOCCUS EPIDERMIDIS  Blood Culture ID Panel (Reflexed)     Status: Abnormal   Collection Time: 06/12/24 10:57 PM  Result Value Ref Range Status   Enterococcus faecalis NOT DETECTED NOT DETECTED Final   Enterococcus Faecium NOT DETECTED NOT DETECTED Final   Listeria monocytogenes NOT DETECTED NOT DETECTED Final   Staphylococcus species DETECTED (A) NOT DETECTED Final    Comment: CRITICAL RESULT CALLED TO, READ BACK BY AND VERIFIED WITH: N BAUGH RN 06/14/2024 @ 0414 BY AB    Staphylococcus aureus (BCID) NOT DETECTED NOT DETECTED Final   Staphylococcus epidermidis DETECTED (A) NOT DETECTED Final    Comment: Methicillin (oxacillin) resistant coagulase negative staphylococcus. Possible blood culture contaminant (unless isolated from more than one blood culture draw or clinical case suggests pathogenicity). No antibiotic treatment is indicated for blood  culture contaminants. CRITICAL RESULT CALLED TO, READ BACK BY AND VERIFIED  WITH: N OKEY RN 06/14/2024 @ 0414 BY AB    Staphylococcus lugdunensis NOT DETECTED NOT DETECTED Final   Streptococcus species NOT DETECTED NOT DETECTED Final   Streptococcus agalactiae NOT DETECTED NOT DETECTED Final   Streptococcus pneumoniae NOT DETECTED NOT DETECTED Final   Streptococcus pyogenes NOT DETECTED NOT DETECTED Final    A.calcoaceticus-baumannii NOT DETECTED NOT DETECTED Final   Bacteroides fragilis NOT DETECTED NOT DETECTED Final   Enterobacterales NOT DETECTED NOT DETECTED Final   Enterobacter cloacae complex NOT DETECTED NOT DETECTED Final   Escherichia coli NOT DETECTED NOT DETECTED Final   Klebsiella aerogenes NOT DETECTED NOT DETECTED Final   Klebsiella oxytoca NOT DETECTED NOT DETECTED Final   Klebsiella pneumoniae NOT DETECTED NOT DETECTED Final   Proteus species NOT DETECTED NOT DETECTED Final   Salmonella species NOT DETECTED NOT DETECTED Final   Serratia marcescens NOT DETECTED NOT DETECTED Final   Haemophilus influenzae NOT DETECTED NOT DETECTED Final   Neisseria meningitidis NOT DETECTED NOT DETECTED Final   Pseudomonas aeruginosa NOT DETECTED NOT DETECTED Final   Stenotrophomonas maltophilia NOT DETECTED NOT DETECTED Final   Candida albicans NOT DETECTED NOT DETECTED Final   Candida auris NOT DETECTED NOT DETECTED Final   Candida glabrata NOT DETECTED NOT DETECTED Final   Candida krusei NOT DETECTED NOT DETECTED Final   Candida parapsilosis NOT DETECTED NOT DETECTED Final   Candida tropicalis NOT DETECTED NOT DETECTED Final   Cryptococcus neoformans/gattii NOT DETECTED NOT DETECTED Final   Methicillin resistance mecA/C DETECTED (A) NOT DETECTED Final    Comment: CRITICAL RESULT CALLED TO, READ BACK BY AND VERIFIED WITHBETHA LOISE OKEY RN 06/14/2024 @ 0414 BY AB Performed at [facility], [address] [place], [place] [address]   Urine Culture     Status: Abnormal   Collection Time: 06/13/24  1:20 PM   Specimen: Urine, Clean Catch  Result Value Ref Range Status   Specimen Description   Final    URINE, CLEAN CATCH Performed at [facility], [address], [place], [place] [address]    Special Requests   Final    NONE Performed at [facility], [address], [place], [place] [address]    Culture (A)  Final    <10,000 COLONIES/mL INSIGNIFICANT GROWTH Performed at [facility], [address] [place], [place] [address]    Report Status 06/14/2024 FINAL  Final  Culture, blood (Routine X 2) w Reflex to ID Panel     Status: Abnormal   Collection Time: 06/14/24  7:44 AM   Specimen: BLOOD  Result Value Ref Range Status   Specimen Description   Final    BLOOD RIGHT ANTECUBITAL Performed at [facility], [address], [place], [place] [address]    Special Requests   Final    BOTTLES DRAWN AEROBIC AND ANAEROBIC Blood Culture adequate volume Performed at [facility], [address], [place], [place] [address]    Culture  Setup Time   Final    GRAM POSITIVE COCCI IN BOTH AEROBIC AND ANAEROBIC BOTTLES Gram Stain Report Called to,Read Back By and Verified With: DSABRA MOLT ON 06/15/2024 @12 :50 BY T.HAMER Performed at [facility], [address], [place], [place] [address]    Culture (A)  Final    STAPHYLOCOCCUS EPIDERMIDIS SUSCEPTIBILITIES PERFORMED ON PREVIOUS CULTURE WITHIN THE LAST 5 DAYS. Performed at [facility], [address] [place], [place] [address]    Report Status 06/17/2024 FINAL  Final  Culture, blood (Routine  X 2) w Reflex to ID Panel     Status: Abnormal   Collection Time: 06/14/24  7:44 AM   Specimen: BLOOD  Result Value Ref Range Status   Specimen Description   Final    BLOOD LEFT ANTECUBITAL Performed at [facility], [address] [place], [place] [address]    Special Requests   Final    BOTTLES DRAWN AEROBIC AND ANAEROBIC Blood Culture adequate volume Performed at [facility], [address] [place], [place] [address]    Culture  Setup Time   Final    GRAM POSITIVE COCCI IN BOTH AEROBIC AND ANAEROBIC BOTTLES Gram Stain Report Called to,Read Back By and Verified With: B. ROBERTSON ON 06/15/2024 @13 :49 BY T.HAMER Performed at [facility], [address] [place], [place] [address]    Culture (A)  Final    STAPHYLOCOCCUS EPIDERMIDIS SUSCEPTIBILITIES PERFORMED ON PREVIOUS CULTURE WITHIN THE LAST 5 DAYS. Performed at [facility], [address] [place], [place] [address]    Report Status 06/17/2024 FINAL  Final  Culture, blood (Routine X 2) w Reflex to ID Panel     Status: None   Collection Time: 06/16/24  2:30 AM   Specimen: BLOOD LEFT ARM  Result Value Ref Range Status   Specimen Description BLOOD LEFT ARM  Final   Special Requests AEROBIC BOTTLE ONLY Blood Culture adequate volume  Final   Culture   Final    NO GROWTH 5 DAYS Performed at [facility], [address], [place], [place] [address]    Report Status 06/21/2024 FINAL  Final  Culture, blood (Routine X 2) w Reflex to ID Panel     Status: None   Collection Time: 06/16/24  2:35 AM   Specimen: Right Antecubital; Blood  Result Value Ref Range Status   Specimen Description RIGHT ANTECUBITAL  Final   Special Requests   Final    BOTTLES DRAWN AEROBIC AND ANAEROBIC Blood Culture adequate volume   Culture   Final    NO GROWTH 5 DAYS Performed at [facility], [address], [place], [place] [address]    Report Status 06/21/2024 FINAL  Final  Surgical pcr screen     Status: None   Collection Time: 06/25/24 12:23 PM   Specimen: Nasal Mucosa; Nasal Swab  Result Value Ref Range Status   MRSA, PCR NEGATIVE NEGATIVE Final   Staphylococcus aureus NEGATIVE NEGATIVE Final    Comment: (NOTE) The Xpert SA Assay (FDA approved for NASAL specimens in patients 54 years of age and older), is one component of a comprehensive surveillance program. It is not intended to diagnose infection nor to guide or monitor treatment. Performed at [facility], [address], [place], [place] [address]   SARS Coronavirus 2 by RT PCR (hospital order, performed in [facility] hospital lab) *cepheid single result test* Nasal Mucosa     Status: None   Collection Time: 06/25/24 12:24 PM   Specimen: Nasal Mucosa; Nasal Swab  Result Value Ref Range Status   SARS Coronavirus 2 by RT PCR NEGATIVE NEGATIVE Final    Comment: Performed at [facility], [address] [place], [place] [address]    Studies/Results: No results found.     Assessment/Plan:  INTERVAL HISTORY:  Patient going to the OR tomorrow  Principal Problem:   Endocarditis of prosthetic aortic valve Active Problems:   Essential hypertension   Chest pain   Chronic heart failure with preserved ejection fraction (HFpEF) (HCC)  S/P CABG x 2   S/P AVR (aortic valve replacement)   BPH with obstruction/lower urinary tract symptoms   SIRS (systemic inflammatory response syndrome) (HCC)   Bacteremia   Acute embolic stroke (HCC)   Endocarditis of tricuspid valve   Congestive heart failure (HCC)   Prosthetic valve endocarditis   Acute stroke due to ischemia [facility])    Kyle Matthews is a 78 y.o. 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  The culprit organism is methicillin resistant Staphylococcus epidermidis   #1 Prosthetic and native valve endocarditis:  We will continue with IV vancomycin   Going to the operating room tomorrow for redo aortic valve replacement using bioprosthetic valve and tricuspid valve repair versus replacement.  Will follow-up intra-operative cultures  Plan on giving him 6 weeks of postoperative vancomycin  or at least of anti MRSA drug, daptomycin could also be used though with his septic emboli to CNS I would prefer CNS penetrating antibiotic (vancomycin ) to start with   #2 TDM: Monitor renal function and vancomycin   #3  Standard precautions  I have personally spent 50 minutes involved in face-to-face and non-face-to-face activities for this patient on the day of the visit. Professional time spent includes the following activities: Preparing to see the patient (review of tests), Obtaining and/or reviewing separately obtained history (admission/discharge  record), Performing a medically appropriate examination and/or evaluation , Ordering medications/tests/procedures, referring and communicating with other health care professionals, Documenting clinical information in the EMR, Independently interpreting results (not separately reported), Communicating results to the patient/family/caregiver, Counseling and educating the patient/family/caregiver and Care coordination (not separately reported).   Evaluation of the patient requires complex antimicrobial therapy evaluation, counseling , isolation needs to reduce disease transmission and risk assessment and mitigation.       LOS: 10 days   Jomarie Fleeta Rothman 06/25/2024, 4:36 PM

## 2024-06-25 NOTE — Progress Notes (Signed)
 5 Days Post-Op Procedure(s) (LRB): RIGHT HEART CATH AND CORONARY ANGIOGRAPHY (N/A) Subjective:  Had some fecal impaction this am and says he received suppository with BM and improvement. Otherwise feels fine.  Objective: Vital signs in last 24 hours: Temp:  [98 F (36.7 C)-98.7 F (37.1 C)] 98 F (36.7 C) (07/08 1201) Pulse Rate:  [75-92] 77 (07/08 1201) Cardiac Rhythm: Normal sinus rhythm;Bundle branch block;Heart block (07/08 0931) Resp:  [15-20] 17 (07/08 1201) BP: (123-159)/(71-91) 134/72 (07/08 1201) SpO2:  [96 %-99 %] 99 % (07/08 1201)  Hemodynamic parameters for last 24 hours:    Intake/Output from previous day: 07/07 0701 - 07/08 0700 In: 970 [P.O.:720; IV Piggyback:250] Out: 400 [Urine:400] Intake/Output this shift: Total I/O In: 492.4 [P.O.:240; IV Piggyback:252.4] Out: -   General appearance: alert and cooperative Neurologic: intact Heart: regular rate and rhythm Lungs: clear to auscultation bilaterally Extremities: no edema  Lab Results: Recent Labs    06/24/24 0245  WBC 11.7*  HGB 9.4*  HCT 28.3*  PLT 203   BMET:  Recent Labs    06/24/24 0245  NA 136  K 3.1*  CL 104  CO2 23  GLUCOSE 100*  BUN 12  CREATININE 0.88  CALCIUM  8.2*    PT/INR: No results for input(s): LABPROT, INR in the last 72 hours. ABG    Component Value Date/Time   PHART 7.367 06/20/2024 1023   HCO3 22.9 06/20/2024 1023   TCO2 24 06/20/2024 1023   ACIDBASEDEF 2.0 06/20/2024 1023   O2SAT 98 06/20/2024 1023   CBG (last 3)  No results for input(s): GLUCAP in the last 72 hours.  Assessment/Plan:  Hemodynamics stable in NSR. Plan to proceed tomorrow with redo AVR using a bioprosthetic valve and tricuspid valve repair or replacement tomorrow. I discussed the operative procedure with the patient and his wife including alternatives, benefits and risks; including but not limited to bleeding, blood transfusion, infection, stroke, myocardial infarction, graft failure,  heart block requiring a permanent pacemaker, organ dysfunction, and death.  [name] understands and agrees to proceed.    LOS: 10 days    Dorise MARLA Fellers 06/25/2024

## 2024-06-25 NOTE — Progress Notes (Signed)
 Pharmacy Antibiotic Note  Kyle Matthews is a 78 y.o. male admitted on 06/12/2024 with MRSE bacteremia. Pt noted to have vegetation on prosthetic aortic valve and tricuspid valve, and also possible CNS emboli.  Pharmacy has been consulted for vancomycin  dosing. Cr has remained <1,   -vancomycin  trough= 26 on 1250mg  IV q12h -ID following ans plans noted for 6 weeks antibiotics post-op -plans noted for OR on 7/9  Plan: Decrease  vancomycin  to 1000mg  IV q12h Will follow post OR 7/9  Height: 5' 2 (157.5 cm) Weight: 69 kg (152 lb 1.9 oz) IBW/kg (Calculated) : 54.6  Temp (24hrs), Avg:98.4 F (36.9 C), Min:98 F (36.7 C), Max:98.7 F (37.1 C)  Recent Labs  Lab 06/19/24 0429 06/19/24 1033 06/20/24 0406 06/21/24 0424 06/22/24 0948 06/24/24 0245 06/25/24 1013  WBC  --   --  12.5*  --   --  11.7*  --   CREATININE 0.65  --  0.91 0.90  --  0.88  --   VANCOTROUGH  --    < >  --   --  29*  --  26*   < > = values in this interval not displayed.    Estimated Creatinine Clearance: 62 mL/min (by C-G formula based on SCr of 0.88 mg/dL).    Allergies  Allergen Reactions   Tetanus Toxoids Other (See Comments)    Childhood Allergy    Zetia  [Ezetimibe ] Diarrhea    Antimicrobials this admission: Vanco 6/27 >>  7/8: vancomycin  trough= 26 on 1250mg  IV q12 7/5: vancomycin  trough= 28 on 1500mg  IV q12 7/2: vancomycin  trough= 12 on 1000mg  IV q12 6/30: Vancomycin  peak 29, trough 4; Vanco AUC: 328; on vancomycin  1250mg  IV q24h  Microbiology results: 6/29 Bcx: ng 6/27 BCx: staph epi 6/25 Bcx: gram + cocci 2/2 sets 6/25 BCID: staph species/staph epi 6/26 Ucx:  insignificant growt  Kyle Matthews, PharmD Clinical Pharmacist **Pharmacist phone directory can now be found on [email or web address] ([email or web address]).  Listed under [facility].

## 2024-06-25 NOTE — Progress Notes (Signed)
 PROGRESS NOTE    Kyle Matthews  FMW:[id number] DOB: 29-Jul-1946 DOA: 06/12/2024 PCP: Verena Mems, MD    Brief Narrative:  78 year old with history of CAD status post CABG 10/2023, AAS status post bioprosthetic AVR, diastolic CHF, CVA, postop A-fib, HTN, HLD, BPH with urinary retention requiring robotic simple prostatectomy 6//25, HTN, HLD presented with chest pain and rigors on 6/25.  Blood cultures positive for MRSE, MRI brain showed concerns of possible embolic stroke.  Echocardiogram negative for endocarditis but TEE showed large mobile density on tricuspid and prosthetic valve.  CT surgery and cardiology team consulted. LHC does not show any new coronary disease, grafts are patent.  CT surgery planning intervention later this week.    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  TEE shows concerns of endocarditis.  CT surgery, ID and cardiology team is following.  MRI is also positive for embolic CVA -Initial blood culture 6/27-positive for MRSE -Repeat blood cultures 6/29-NGTD - LHC does not show any new coronary disease, grafts are patent.  CT surgery planning intervention next week  Acute CVA, likely embolic - MRI brain is positive for acute CVA.  Echocardiogram shows EF 50%, moderate TR.  LDL 66, A1c 5.1.   Neurology recommended aspirin .  History of BPH - Status post laparoscopic prostatectomy on 6/4, Foley catheter was removed on 6/16.  Currently able to urinate.  Continue finasteride   Hyperlipidemia - Statin  Essential hypertension - IV as needed  Hypokalemia - As needed repletion   DVT prophylaxis: SQ Hep    Code Status: Full Code Family Communication:   Status is: Inpatient Ongoing management for endocarditis.    Subjective:  No complaints. Constipated this morning but after receiving suppository he had a large bowel movement which made him feel much better  Examination:  General exam: Appears calm and comfortable  Respiratory system: Clear to auscultation. Respiratory effort normal. Cardiovascular system: S1 & S2 heard, RRR. No JVD, murmurs, rubs, gallops or clicks. No pedal edema. Gastrointestinal system: Abdomen is nondistended, soft and nontender. No organomegaly or masses felt. Normal bowel sounds heard. Central nervous system: Alert and oriented. No focal neurological deficits. Extremities: Symmetric 5 x 5 power. Skin: No specific facial rash, pruritus Psychiatry: Judgement and insight appear normal. Mood & affect appropriate.                Diet Orders (From admission, onward)     Start     Ordered   06/20/24 1237  Diet Heart Room service appropriate? Yes; Fluid consistency: Thin  Diet effective now       Question Answer Comment  Room service appropriate? Yes   Fluid consistency: Thin      06/20/24 1236            Objective: Vitals:   06/25/24 0010 06/25/24 0444 06/25/24 9068  06/25/24 1201  BP: 133/77 (!) 123/91 (!) 159/83 134/72  Pulse: 85 80 92 77  Resp: 15 16 18 17   Temp: 98.7 F (37.1 C) 98.4 F (36.9 C) 98.2 F (36.8 C) 98 F (36.7 C)  TempSrc: Oral Oral Oral Oral  SpO2: 97% 96% 96% 99%  Weight:      Height:        Intake/Output Summary (Last 24 hours) at 06/25/2024 1245 Last data filed at 06/25/2024 1154 Gross per 24  hour  Intake 732.37 ml  Output 300 ml  Net 432.37 ml   Filed Weights   06/12/24 1657 06/12/24 2330 06/20/24 0616  Weight: 71 kg 69.5 kg 69 kg    Scheduled Meds:  aspirin   81 mg Oral Daily   finasteride   5 mg Oral QPM   heparin  injection (subcutaneous)  5,000 Units Subcutaneous Q8H   pantoprazole  (PROTONIX ) IV  40 mg Intravenous Q24H   rosuvastatin   40 mg Oral Daily   Continuous Infusions:  vancomycin  1,250 mg (06/25/24 1116)    Nutritional status     Body mass index is 27.82 kg/m.  Data Reviewed:   CBC: Recent Labs  Lab 06/20/24 0406 06/20/24 1018 06/20/24 1023 06/24/24 0245  WBC 12.5*  --   --  11.7*  HGB 9.7* 9.2* 8.8* 9.4*  HCT 29.1* 27.0* 26.0* 28.3*  MCV 90.7  --   --  88.7  PLT 189  --   --  203   Basic Metabolic Panel: Recent Labs  Lab 06/19/24 0429 06/20/24 0406 06/20/24 1018 06/20/24 1023 06/21/24 0424 06/24/24 0245  NA 134* 136 137 139  --  136  K 3.5 3.6 3.8 3.4*  --  3.1*  CL 103 103  --   --   --  104  CO2 24 24  --   --   --  23  GLUCOSE 113* 104*  --   --   --  100*  BUN 10 10  --   --   --  12  CREATININE 0.65 0.91  --   --  0.90 0.88  CALCIUM  8.3* 8.6*  --   --   --  8.2*  MG  --   --   --   --   --  2.0   GFR: Estimated Creatinine Clearance: 62 mL/min (by C-G formula based on SCr of 0.88 mg/dL). Liver Function Tests: No results for input(s): AST, ALT, ALKPHOS, BILITOT, PROT, ALBUMIN  in the last 168 hours. No results for input(s): LIPASE, AMYLASE in the last 168 hours. No results for input(s): AMMONIA in the last 168 hours. Coagulation Profile: No results for input(s): INR, PROTIME in the last 168 hours. Cardiac Enzymes: No results for input(s): CKTOTAL, CKMB, CKMBINDEX, TROPONINI in the last 168 hours. BNP (last 3 results) No results for input(s): PROBNP in the last 8760 hours. HbA1C: No results for input(s): HGBA1C in the last 72 hours. CBG: Recent Labs  Lab 06/18/24 2129  GLUCAP 122*    Lipid Profile: No results for input(s): CHOL, HDL, LDLCALC, TRIG, CHOLHDL, LDLDIRECT in the last 72 hours. Thyroid  Function Tests: No results for input(s): TSH, T4TOTAL, FREET4, T3FREE, THYROIDAB in the last 72 hours. Anemia Panel: No results for input(s): VITAMINB12, FOLATE, FERRITIN, TIBC, IRON , RETICCTPCT in the last 72 hours. Sepsis Labs: No results for input(s): PROCALCITON, LATICACIDVEN in the last 168 hours.  Recent Results (from the past 240 hours)  Culture, blood (Routine X 2) w Reflex to ID Panel  Status: None   Collection Time: 06/16/24  2:30 AM   Specimen: BLOOD LEFT ARM  Result Value Ref Range Status   Specimen Description BLOOD LEFT ARM  Final   Special Requests AEROBIC BOTTLE ONLY Blood Culture adequate volume  Final   Culture   Final    NO GROWTH 5 DAYS Performed at [facility], [address], [place], [place] [address]    Report Status 06/21/2024 FINAL  Final  Culture, blood (Routine X 2) w Reflex to ID Panel     Status: None   Collection Time: 06/16/24  2:35 AM   Specimen: Right Antecubital; Blood  Result Value Ref Range Status   Specimen Description RIGHT ANTECUBITAL  Final   Special Requests   Final    BOTTLES DRAWN AEROBIC AND ANAEROBIC Blood Culture adequate volume   Culture   Final    NO GROWTH 5 DAYS Performed at [facility], [address] [place], [place] [address]    Report Status 06/21/2024 FINAL  Final         Radiology Studies: No results found.         LOS: 10 days   Time spent= 35 mins    Burgess JAYSON Dare, MD [facility]  If 7PM-7AM, please contact night-coverage  06/25/2024, 12:45 PM

## 2024-06-25 NOTE — Progress Notes (Addendum)
 Progress Note  Patient Name: Kyle Matthews Date of Encounter: 06/25/2024 [facility] Cardiologist: Lurena MARLA Red, MD   Interval Summary   78 y.o. male with a hx of CAD (s/p CABG in 10/2023 with LIMA-LAD and SVG-Ramus at the time of AVR), severe AS (s/p AVR with 23 mm Edwards Inspiris Resilia pericardial valve in 10/2023), post-op atrial fibrillation following CABG and AVR, HTN, HLD, history of CVA, COPD, tobacco use, anemia and BPH s/p robot-assisted prostatectomy 05/22/24 , who is admitted for bioprosthetic aortic valve endocarditis and tricuspid valve endocarditis, noted on TEE 06/18/24.   Patient states he feels unchanged, denied chest pain, SOB, leg edema. He states his surgery has moved up to tomorrow, feels a little nervous.    Vital Signs Vitals:   06/24/24 1553 06/24/24 2047 06/25/24 0010 06/25/24 0444  BP: 132/73 126/71 133/77 (!) 123/91  Pulse: 75 81 85 80  Resp: 20 16 15 16   Temp: 98.5 F (36.9 C) 98.5 F (36.9 C) 98.7 F (37.1 C) 98.4 F (36.9 C)  TempSrc: Oral Oral Oral Oral  SpO2: 96% 96% 97% 96%  Weight:      Height:        Intake/Output Summary (Last 24 hours) at 06/25/2024 0745 Last data filed at 06/25/2024 0000 Gross per 24 hour  Intake 970 ml  Output 400 ml  Net 570 ml      06/20/2024    6:16 AM 06/12/2024   11:30 PM 06/12/2024    4:57 PM  Last 3 Weights  Weight (lbs) 152 lb 1.9 oz 153 lb 3.5 oz 156 lb 8.4 oz  Weight (kg) 69 kg 69.5 kg 71 kg      Telemetry/ECG  Sinus rhythm 70-80s, first degree AVB - Personally Reviewed  Physical Exam  GEN: No acute distress.   Neck: No JVD Cardiac: RRR, soft systolic murmur throughout  Respiratory: Clear to auscultation bilaterally. On room air. Speaks full sentence  GI: Soft, nontender, non-distended  MS: No leg edema  Assessment & Plan   Prosthetic aortic valve and tricuspid  endocarditis  - presented with chills, chest pain, SOB initially  - Blood culture at admission + MRSE, last blood culture from  06/16/24 NTD  - TEE 06/18/24 showed LVEF 65-70%, Mobile density on TV measuring 1.55cm x 0.447cm, moderate TR, Mobile mass measuring 1.15cm x 1.29cm with a 1.66 cm mobile tail suggestive of bioprosthetic aortic valve endocarditis, trivial AI, Aortic valve mean gradient measures 20.0 mmHg. Aortic valve Vmax measures 2.90 m/s.  - ID following, continued on IV vancomycin   - CT surgery consulted, patient is planned for re-do AVR  06/26/24, will monitor post-op for arrhythmia, keep K >4 and Mag >2 please   - clinically compensated, no signs of CHF, continued on PTA PRN lasix    Chest pain Elevated troponin  CAD with hx of CABG with LIMA-LAD and SVG-Ramus 10/2023 - Hs trop 99 >86 - Heart cath 06/20/24 with patent grafts, non-obstructive RCA disease  - likely demand ischemia, no further chest pain  - continue PTA ASA 81mg , crestor  40mg    Acute CVA - MRI of brain showed small acute infarct in the left occipital lobe.  - Etiology suspect septic emboli, no focal deficit on exam  - on ASA per primary team   BPH s/p laparoscopic prostatectomy on 05/22/2024 HTN HLD COPD Tobacco use - Per IM    For questions or updates, please contact [facility] Please consult [email or web address] for contact info under  Signed, Shirl Fruits, NP   Patient seen and examined with Xika Zhao NP.  Agree as above, with the following exceptions and changes as noted below. Feels well with no CP or SOB. Gen: NAD, CV: RRR, soft systolic murmur, Lungs: clear, Abd: soft, Extrem: Warm, well perfused, no edema, Neuro/Psych: alert and oriented x 3, normal mood and affect. All available labs, radiology testing, previous records reviewed. Anticipating surgery for bioprosthetic AV endocarditis with TV endocarditis. No high grade AVB on tele. Stable symptoms, seems more calm tomorrow. Surgery tentatively planned tomorrow.  Gianelle Mccaul A Naveh Rickles, MD

## 2024-06-25 NOTE — H&P (View-Only) (Signed)
 5 Days Post-Op Procedure(s) (LRB): RIGHT HEART CATH AND CORONARY ANGIOGRAPHY (N/A) Subjective:  Had some fecal impaction this am and says he received suppository with BM and improvement. Otherwise feels fine.  Objective: Vital signs in last 24 hours: Temp:  [98 F (36.7 C)-98.7 F (37.1 C)] 98 F (36.7 C) (07/08 1201) Pulse Rate:  [75-92] 77 (07/08 1201) Cardiac Rhythm: Normal sinus rhythm;Bundle branch block;Heart block (07/08 0931) Resp:  [15-20] 17 (07/08 1201) BP: (123-159)/(71-91) 134/72 (07/08 1201) SpO2:  [96 %-99 %] 99 % (07/08 1201)  Hemodynamic parameters for last 24 hours:    Intake/Output from previous day: 07/07 0701 - 07/08 0700 In: 970 [P.O.:720; IV Piggyback:250] Out: 400 [Urine:400] Intake/Output this shift: Total I/O In: 492.4 [P.O.:240; IV Piggyback:252.4] Out: -   General appearance: alert and cooperative Neurologic: intact Heart: regular rate and rhythm Lungs: clear to auscultation bilaterally Extremities: no edema  Lab Results: Recent Labs    06/24/24 0245  WBC 11.7*  HGB 9.4*  HCT 28.3*  PLT 203   BMET:  Recent Labs    06/24/24 0245  NA 136  K 3.1*  CL 104  CO2 23  GLUCOSE 100*  BUN 12  CREATININE 0.88  CALCIUM  8.2*    PT/INR: No results for input(s): LABPROT, INR in the last 72 hours. ABG    Component Value Date/Time   PHART 7.367 06/20/2024 1023   HCO3 22.9 06/20/2024 1023   TCO2 24 06/20/2024 1023   ACIDBASEDEF 2.0 06/20/2024 1023   O2SAT 98 06/20/2024 1023   CBG (last 3)  No results for input(s): GLUCAP in the last 72 hours.  Assessment/Plan:  Hemodynamics stable in NSR. Plan to proceed tomorrow with redo AVR using a bioprosthetic valve and tricuspid valve repair or replacement tomorrow. I discussed the operative procedure with the patient and his wife including alternatives, benefits and risks; including but not limited to bleeding, blood transfusion, infection, stroke, myocardial infarction, graft failure,  heart block requiring a permanent pacemaker, organ dysfunction, and death.  Kyle Matthews understands and agrees to proceed.    LOS: 10 days    Dorise MARLA Fellers 06/25/2024

## 2024-06-26 ENCOUNTER — Other Ambulatory Visit: Payer: Self-pay

## 2024-06-26 ENCOUNTER — Encounter (HOSPITAL_COMMUNITY): Admitting: Certified Registered Nurse Anesthetist

## 2024-06-26 ENCOUNTER — Encounter (HOSPITAL_COMMUNITY): Payer: Self-pay | Admitting: Internal Medicine

## 2024-06-26 ENCOUNTER — Inpatient Hospital Stay (HOSPITAL_COMMUNITY): Admitting: Certified Registered Nurse Anesthetist

## 2024-06-26 ENCOUNTER — Inpatient Hospital Stay (HOSPITAL_COMMUNITY)

## 2024-06-26 ENCOUNTER — Inpatient Hospital Stay (HOSPITAL_COMMUNITY)
Admission: EM | Disposition: A | Discharge status: Home Health | Payer: Self-pay | Source: Other Acute Inpatient Hospital | Attending: Surgery

## 2024-06-26 DIAGNOSIS — B9562 Methicillin resistant Staphylococcus aureus infection as the cause of diseases classified elsewhere: Secondary | ICD-10-CM

## 2024-06-26 DIAGNOSIS — I7121 Aneurysm of the ascending aorta, without rupture: Secondary | ICD-10-CM

## 2024-06-26 DIAGNOSIS — I110 Hypertensive heart disease with heart failure: Secondary | ICD-10-CM | POA: Diagnosis not present

## 2024-06-26 DIAGNOSIS — T826XXA Infection and inflammatory reaction due to cardiac valve prosthesis, initial encounter: Secondary | ICD-10-CM

## 2024-06-26 DIAGNOSIS — I25110 Atherosclerotic heart disease of native coronary artery with unstable angina pectoris: Secondary | ICD-10-CM | POA: Diagnosis not present

## 2024-06-26 DIAGNOSIS — I330 Acute and subacute infective endocarditis: Secondary | ICD-10-CM | POA: Diagnosis not present

## 2024-06-26 DIAGNOSIS — I5033 Acute on chronic diastolic (congestive) heart failure: Secondary | ICD-10-CM | POA: Diagnosis not present

## 2024-06-26 HISTORY — PX: CORONARY ARTERY BYPASS GRAFT: SHX141

## 2024-06-26 HISTORY — PX: TEE WITHOUT CARDIOVERSION: SHX5443

## 2024-06-26 HISTORY — PX: REDO STERNOTOMY: SHX7393

## 2024-06-26 HISTORY — PX: BENTALL PROCEDURE: SHX5058

## 2024-06-26 HISTORY — PX: TRICUSPID VALVE REPLACEMENT: SHX816

## 2024-06-26 LAB — POCT I-STAT, CHEM 8
BUN: 10 mg/dL (ref 8–23)
BUN: 12 mg/dL (ref 8–23)
BUN: 11 mg/dL (ref 8–23)
BUN: 12 mg/dL (ref 8–23)
BUN: 12 mg/dL (ref 8–23)
BUN: 13 mg/dL (ref 8–23)
BUN: 14 mg/dL (ref 8–23)
BUN: 13 mg/dL (ref 8–23)
BUN: 18 mg/dL (ref 8–23)
Calcium, Ion: 1.19 mmol/L (ref 1.15–1.40)
Calcium, Ion: 1.27 mmol/L (ref 1.15–1.40)
Calcium, Ion: 1.12 mmol/L — ABNORMAL LOW (ref 1.15–1.40)
Calcium, Ion: 1.14 mmol/L — ABNORMAL LOW (ref 1.15–1.40)
Calcium, Ion: 1.09 mmol/L — ABNORMAL LOW (ref 1.15–1.40)
Calcium, Ion: 1.12 mmol/L — ABNORMAL LOW (ref 1.15–1.40)
Calcium, Ion: 1.1 mmol/L — ABNORMAL LOW (ref 1.15–1.40)
Calcium, Ion: 1.05 mmol/L — ABNORMAL LOW (ref 1.15–1.40)
Calcium, Ion: 1.27 mmol/L (ref 1.15–1.40)
Chloride: 104 mmol/L (ref 98–111)
Chloride: 100 mmol/L (ref 98–111)
Chloride: 101 mmol/L (ref 98–111)
Chloride: 102 mmol/L (ref 98–111)
Chloride: 99 mmol/L (ref 98–111)
Chloride: 100 mmol/L (ref 98–111)
Chloride: 99 mmol/L (ref 98–111)
Chloride: 100 mmol/L (ref 98–111)
Chloride: 101 mmol/L (ref 98–111)
Creatinine, Ser: 0.6 mg/dL — ABNORMAL LOW (ref 0.61–1.24)
Creatinine, Ser: 0.9 mg/dL (ref 0.61–1.24)
Creatinine, Ser: 0.9 mg/dL (ref 0.61–1.24)
Creatinine, Ser: 1 mg/dL (ref 0.61–1.24)
Creatinine, Ser: 1 mg/dL (ref 0.61–1.24)
Creatinine, Ser: 1 mg/dL (ref 0.61–1.24)
Creatinine, Ser: 1 mg/dL (ref 0.61–1.24)
Creatinine, Ser: 0.9 mg/dL (ref 0.61–1.24)
Creatinine, Ser: 1.1 mg/dL (ref 0.61–1.24)
Glucose, Bld: 97 mg/dL (ref 70–99)
Glucose, Bld: 139 mg/dL — ABNORMAL HIGH (ref 70–99)
Glucose, Bld: 109 mg/dL — ABNORMAL HIGH (ref 70–99)
Glucose, Bld: 138 mg/dL — ABNORMAL HIGH (ref 70–99)
Glucose, Bld: 145 mg/dL — ABNORMAL HIGH (ref 70–99)
Glucose, Bld: 137 mg/dL — ABNORMAL HIGH (ref 70–99)
Glucose, Bld: 131 mg/dL — ABNORMAL HIGH (ref 70–99)
Glucose, Bld: 151 mg/dL — ABNORMAL HIGH (ref 70–99)
Glucose, Bld: 126 mg/dL — ABNORMAL HIGH (ref 70–99)
HCT: 25 % — ABNORMAL LOW (ref 39.0–52.0)
HCT: 27 % — ABNORMAL LOW (ref 39.0–52.0)
HCT: 23 % — ABNORMAL LOW (ref 39.0–52.0)
HCT: 23 % — ABNORMAL LOW (ref 39.0–52.0)
HCT: 22 % — ABNORMAL LOW (ref 39.0–52.0)
HCT: 23 % — ABNORMAL LOW (ref 39.0–52.0)
HCT: 21 % — ABNORMAL LOW (ref 39.0–52.0)
HCT: 25 % — ABNORMAL LOW (ref 39.0–52.0)
HCT: 29 % — ABNORMAL LOW (ref 39.0–52.0)
Hemoglobin: 8.5 g/dL — ABNORMAL LOW (ref 13.0–17.0)
Hemoglobin: 9.2 g/dL — ABNORMAL LOW (ref 13.0–17.0)
Hemoglobin: 7.8 g/dL — ABNORMAL LOW (ref 13.0–17.0)
Hemoglobin: 7.8 g/dL — ABNORMAL LOW (ref 13.0–17.0)
Hemoglobin: 7.5 g/dL — ABNORMAL LOW (ref 13.0–17.0)
Hemoglobin: 7.8 g/dL — ABNORMAL LOW (ref 13.0–17.0)
Hemoglobin: 7.1 g/dL — ABNORMAL LOW (ref 13.0–17.0)
Hemoglobin: 8.5 g/dL — ABNORMAL LOW (ref 13.0–17.0)
Hemoglobin: 9.9 g/dL — ABNORMAL LOW (ref 13.0–17.0)
Potassium: 3.1 mmol/L — ABNORMAL LOW (ref 3.5–5.1)
Potassium: 3.6 mmol/L (ref 3.5–5.1)
Potassium: 3.7 mmol/L (ref 3.5–5.1)
Potassium: 4.2 mmol/L (ref 3.5–5.1)
Potassium: 4.2 mmol/L (ref 3.5–5.1)
Potassium: 4.1 mmol/L (ref 3.5–5.1)
Potassium: 4.2 mmol/L (ref 3.5–5.1)
Potassium: 4.5 mmol/L (ref 3.5–5.1)
Potassium: 4.8 mmol/L (ref 3.5–5.1)
Sodium: 139 mmol/L (ref 135–145)
Sodium: 134 mmol/L — ABNORMAL LOW (ref 135–145)
Sodium: 134 mmol/L — ABNORMAL LOW (ref 135–145)
Sodium: 134 mmol/L — ABNORMAL LOW (ref 135–145)
Sodium: 133 mmol/L — ABNORMAL LOW (ref 135–145)
Sodium: 133 mmol/L — ABNORMAL LOW (ref 135–145)
Sodium: 133 mmol/L — ABNORMAL LOW (ref 135–145)
Sodium: 133 mmol/L — ABNORMAL LOW (ref 135–145)
Sodium: 134 mmol/L — ABNORMAL LOW (ref 135–145)
TCO2: 23 mmol/L (ref 22–32)
TCO2: 26 mmol/L (ref 22–32)
TCO2: 23 mmol/L (ref 22–32)
TCO2: 24 mmol/L (ref 22–32)
TCO2: 25 mmol/L (ref 22–32)
TCO2: 24 mmol/L (ref 22–32)
TCO2: 26 mmol/L (ref 22–32)
TCO2: 25 mmol/L (ref 22–32)
TCO2: 25 mmol/L (ref 22–32)

## 2024-06-26 LAB — HEMOGLOBIN AND HEMATOCRIT, BLOOD
HCT: 24 % — ABNORMAL LOW (ref 39.0–52.0)
HCT: 23.7 % — ABNORMAL LOW (ref 39.0–52.0)
Hemoglobin: 8 g/dL — ABNORMAL LOW (ref 13.0–17.0)
Hemoglobin: 8 g/dL — ABNORMAL LOW (ref 13.0–17.0)

## 2024-06-26 LAB — POCT I-STAT 7, (LYTES, BLD GAS, ICA,H+H)
Acid-Base Excess: 6 mmol/L — ABNORMAL HIGH (ref 0.0–2.0)
Acid-Base Excess: 1 mmol/L (ref 0.0–2.0)
Acid-Base Excess: 0 mmol/L (ref 0.0–2.0)
Acid-base deficit: 5 mmol/L — ABNORMAL HIGH (ref 0.0–2.0)
Acid-base deficit: 3 mmol/L — ABNORMAL HIGH (ref 0.0–2.0)
Acid-base deficit: 2 mmol/L (ref 0.0–2.0)
Acid-base deficit: 3 mmol/L — ABNORMAL HIGH (ref 0.0–2.0)
Acid-base deficit: 1 mmol/L (ref 0.0–2.0)
Acid-base deficit: 1 mmol/L (ref 0.0–2.0)
Acid-base deficit: 1 mmol/L (ref 0.0–2.0)
Acid-base deficit: 1 mmol/L (ref 0.0–2.0)
Bicarbonate: 20.1 mmol/L (ref 20.0–28.0)
Bicarbonate: 30.9 mmol/L — ABNORMAL HIGH (ref 20.0–28.0)
Bicarbonate: 21.3 mmol/L (ref 20.0–28.0)
Bicarbonate: 22.4 mmol/L (ref 20.0–28.0)
Bicarbonate: 22.9 mmol/L (ref 20.0–28.0)
Bicarbonate: 24.3 mmol/L (ref 20.0–28.0)
Bicarbonate: 26.4 mmol/L (ref 20.0–28.0)
Bicarbonate: 24.4 mmol/L (ref 20.0–28.0)
Bicarbonate: 26.1 mmol/L (ref 20.0–28.0)
Bicarbonate: 25.8 mmol/L (ref 20.0–28.0)
Bicarbonate: 24.8 mmol/L (ref 20.0–28.0)
Calcium, Ion: 1.18 mmol/L (ref 1.15–1.40)
Calcium, Ion: 1 mmol/L — ABNORMAL LOW (ref 1.15–1.40)
Calcium, Ion: 1.12 mmol/L — ABNORMAL LOW (ref 1.15–1.40)
Calcium, Ion: 1.09 mmol/L — ABNORMAL LOW (ref 1.15–1.40)
Calcium, Ion: 1.1 mmol/L — ABNORMAL LOW (ref 1.15–1.40)
Calcium, Ion: 1.12 mmol/L — ABNORMAL LOW (ref 1.15–1.40)
Calcium, Ion: 1.05 mmol/L — ABNORMAL LOW (ref 1.15–1.40)
Calcium, Ion: 1.04 mmol/L — ABNORMAL LOW (ref 1.15–1.40)
Calcium, Ion: 1.29 mmol/L (ref 1.15–1.40)
Calcium, Ion: 1.13 mmol/L — ABNORMAL LOW (ref 1.15–1.40)
Calcium, Ion: 1.34 mmol/L (ref 1.15–1.40)
HCT: 26 % — ABNORMAL LOW (ref 39.0–52.0)
HCT: 23 % — ABNORMAL LOW (ref 39.0–52.0)
HCT: 21 % — ABNORMAL LOW (ref 39.0–52.0)
HCT: 20 % — ABNORMAL LOW (ref 39.0–52.0)
HCT: 26 % — ABNORMAL LOW (ref 39.0–52.0)
HCT: 22 % — ABNORMAL LOW (ref 39.0–52.0)
HCT: 20 % — ABNORMAL LOW (ref 39.0–52.0)
HCT: 24 % — ABNORMAL LOW (ref 39.0–52.0)
HCT: 26 % — ABNORMAL LOW (ref 39.0–52.0)
HCT: 20 % — ABNORMAL LOW (ref 39.0–52.0)
HCT: 27 % — ABNORMAL LOW (ref 39.0–52.0)
Hemoglobin: 8.8 g/dL — ABNORMAL LOW (ref 13.0–17.0)
Hemoglobin: 7.8 g/dL — ABNORMAL LOW (ref 13.0–17.0)
Hemoglobin: 7.1 g/dL — ABNORMAL LOW (ref 13.0–17.0)
Hemoglobin: 6.8 g/dL — CL (ref 13.0–17.0)
Hemoglobin: 8.8 g/dL — ABNORMAL LOW (ref 13.0–17.0)
Hemoglobin: 7.5 g/dL — ABNORMAL LOW (ref 13.0–17.0)
Hemoglobin: 6.8 g/dL — CL (ref 13.0–17.0)
Hemoglobin: 8.2 g/dL — ABNORMAL LOW (ref 13.0–17.0)
Hemoglobin: 8.8 g/dL — ABNORMAL LOW (ref 13.0–17.0)
Hemoglobin: 6.8 g/dL — CL (ref 13.0–17.0)
Hemoglobin: 9.2 g/dL — ABNORMAL LOW (ref 13.0–17.0)
O2 Saturation: 100 %
O2 Saturation: 100 %
O2 Saturation: 100 %
O2 Saturation: 100 %
O2 Saturation: 100 %
O2 Saturation: 100 %
O2 Saturation: 100 %
O2 Saturation: 100 %
O2 Saturation: 92 %
O2 Saturation: 100 %
O2 Saturation: 97 %
Patient temperature: 36.3
Potassium: 3.1 mmol/L — ABNORMAL LOW (ref 3.5–5.1)
Potassium: 3.5 mmol/L (ref 3.5–5.1)
Potassium: 4.5 mmol/L (ref 3.5–5.1)
Potassium: 4 mmol/L (ref 3.5–5.1)
Potassium: 4.2 mmol/L (ref 3.5–5.1)
Potassium: 4.5 mmol/L (ref 3.5–5.1)
Potassium: 4 mmol/L (ref 3.5–5.1)
Potassium: 4.4 mmol/L (ref 3.5–5.1)
Potassium: 4.8 mmol/L (ref 3.5–5.1)
Potassium: 4.1 mmol/L (ref 3.5–5.1)
Potassium: 4.5 mmol/L (ref 3.5–5.1)
Sodium: 139 mmol/L (ref 135–145)
Sodium: 139 mmol/L (ref 135–145)
Sodium: 134 mmol/L — ABNORMAL LOW (ref 135–145)
Sodium: 132 mmol/L — ABNORMAL LOW (ref 135–145)
Sodium: 134 mmol/L — ABNORMAL LOW (ref 135–145)
Sodium: 133 mmol/L — ABNORMAL LOW (ref 135–145)
Sodium: 135 mmol/L (ref 135–145)
Sodium: 133 mmol/L — ABNORMAL LOW (ref 135–145)
Sodium: 134 mmol/L — ABNORMAL LOW (ref 135–145)
Sodium: 136 mmol/L (ref 135–145)
Sodium: 137 mmol/L (ref 135–145)
TCO2: 21 mmol/L — ABNORMAL LOW (ref 22–32)
TCO2: 32 mmol/L (ref 22–32)
TCO2: 22 mmol/L (ref 22–32)
TCO2: 24 mmol/L (ref 22–32)
TCO2: 24 mmol/L (ref 22–32)
TCO2: 26 mmol/L (ref 22–32)
TCO2: 28 mmol/L (ref 22–32)
TCO2: 26 mmol/L (ref 22–32)
TCO2: 28 mmol/L (ref 22–32)
TCO2: 27 mmol/L (ref 22–32)
TCO2: 26 mmol/L (ref 22–32)
pCO2 arterial: 37 mmHg (ref 32–48)
pCO2 arterial: 45.9 mmHg (ref 32–48)
pCO2 arterial: 31.2 mmHg — ABNORMAL LOW (ref 32–48)
pCO2 arterial: 36 mmHg (ref 32–48)
pCO2 arterial: 40 mmHg (ref 32–48)
pCO2 arterial: 44 mmHg (ref 32–48)
pCO2 arterial: 42.6 mmHg (ref 32–48)
pCO2 arterial: 42 mmHg (ref 32–48)
pCO2 arterial: 55.7 mmHg — ABNORMAL HIGH (ref 32–48)
pCO2 arterial: 51.8 mmHg — ABNORMAL HIGH (ref 32–48)
pCO2 arterial: 45.4 mmHg (ref 32–48)
pH, Arterial: 7.343 — ABNORMAL LOW (ref 7.35–7.45)
pH, Arterial: 7.436 (ref 7.35–7.45)
pH, Arterial: 7.443 (ref 7.35–7.45)
pH, Arterial: 7.356 (ref 7.35–7.45)
pH, Arterial: 7.413 (ref 7.35–7.45)
pH, Arterial: 7.35 (ref 7.35–7.45)
pH, Arterial: 7.4 (ref 7.35–7.45)
pH, Arterial: 7.372 (ref 7.35–7.45)
pH, Arterial: 7.279 — ABNORMAL LOW (ref 7.35–7.45)
pH, Arterial: 7.305 — ABNORMAL LOW (ref 7.35–7.45)
pH, Arterial: 7.341 — ABNORMAL LOW (ref 7.35–7.45)
pO2, Arterial: 479 mmHg — ABNORMAL HIGH (ref 83–108)
pO2, Arterial: 498 mmHg — ABNORMAL HIGH (ref 83–108)
pO2, Arterial: 254 mmHg — ABNORMAL HIGH (ref 83–108)
pO2, Arterial: 260 mmHg — ABNORMAL HIGH (ref 83–108)
pO2, Arterial: 263 mmHg — ABNORMAL HIGH (ref 83–108)
pO2, Arterial: 255 mmHg — ABNORMAL HIGH (ref 83–108)
pO2, Arterial: 255 mmHg — ABNORMAL HIGH (ref 83–108)
pO2, Arterial: 279 mmHg — ABNORMAL HIGH (ref 83–108)
pO2, Arterial: 74 mmHg — ABNORMAL LOW (ref 83–108)
pO2, Arterial: 197 mmHg — ABNORMAL HIGH (ref 83–108)
pO2, Arterial: 92 mmHg (ref 83–108)

## 2024-06-26 LAB — BASIC METABOLIC PANEL WITH GFR
Anion gap: 6 (ref 5–15)
BUN: 12 mg/dL (ref 8–23)
CO2: 23 mmol/L (ref 22–32)
Calcium: 8.2 mg/dL — ABNORMAL LOW (ref 8.9–10.3)
Chloride: 105 mmol/L (ref 98–111)
Creatinine, Ser: 1 mg/dL (ref 0.61–1.24)
GFR, Estimated: >60 mL/min (ref >60)
Glucose, Bld: 95 mg/dL (ref 70–99)
Potassium: 4.1 mmol/L (ref 3.5–5.1)
Sodium: 134 mmol/L — ABNORMAL LOW (ref 135–145)

## 2024-06-26 LAB — PLATELET COUNT
Platelets: 124 K/uL — ABNORMAL LOW (ref 150–400)
Platelets: 119 K/uL — ABNORMAL LOW (ref 150–400)

## 2024-06-26 LAB — CBC
HCT: 31.2 % — ABNORMAL LOW (ref 39.0–52.0)
HCT: 28.2 % — ABNORMAL LOW (ref 39.0–52.0)
Hemoglobin: 10.3 g/dL — ABNORMAL LOW (ref 13.0–17.0)
Hemoglobin: 9.5 g/dL — ABNORMAL LOW (ref 13.0–17.0)
MCH: 29.8 pg (ref 26.0–34.0)
MCH: 29.9 pg (ref 26.0–34.0)
MCHC: 33 g/dL (ref 30.0–36.0)
MCHC: 33.7 g/dL (ref 30.0–36.0)
MCV: 90.2 fL (ref 80.0–100.0)
MCV: 88.7 fL (ref 80.0–100.0)
Platelets: 262 K/uL (ref 150–400)
Platelets: 145 K/uL — ABNORMAL LOW (ref 150–400)
RBC: 3.46 MIL/uL — ABNORMAL LOW (ref 4.22–5.81)
RBC: 3.18 MIL/uL — ABNORMAL LOW (ref 4.22–5.81)
RDW: 13.8 % (ref 11.5–15.5)
RDW: 15.6 % — ABNORMAL HIGH (ref 11.5–15.5)
WBC: 10.8 K/uL — ABNORMAL HIGH (ref 4.0–10.5)
WBC: 15.7 K/uL — ABNORMAL HIGH (ref 4.0–10.5)
nRBC: 0 % (ref 0.0–0.2)
nRBC: 0 % (ref 0.0–0.2)

## 2024-06-26 LAB — POCT I-STAT EG7
Acid-Base Excess: 0 mmol/L (ref 0.0–2.0)
Bicarbonate: 25.1 mmol/L (ref 20.0–28.0)
Calcium, Ion: 1.09 mmol/L — ABNORMAL LOW (ref 1.15–1.40)
HCT: 21 % — ABNORMAL LOW (ref 39.0–52.0)
Hemoglobin: 7.1 g/dL — ABNORMAL LOW (ref 13.0–17.0)
O2 Saturation: 88 %
Potassium: 3.3 mmol/L — ABNORMAL LOW (ref 3.5–5.1)
Sodium: 137 mmol/L (ref 135–145)
TCO2: 26 mmol/L (ref 22–32)
pCO2, Ven: 41.3 mmHg — ABNORMAL LOW (ref 44–60)
pH, Ven: 7.392 (ref 7.25–7.43)
pO2, Ven: 55 mmHg — ABNORMAL HIGH (ref 32–45)

## 2024-06-26 LAB — PROTIME-INR
INR: 0.9 (ref 0.8–1.2)
Prothrombin Time: 12.1 s (ref 11.4–15.2)

## 2024-06-26 LAB — GLUCOSE, CAPILLARY
Glucose-Capillary: 104 mg/dL — ABNORMAL HIGH (ref 70–99)
Glucose-Capillary: 119 mg/dL — ABNORMAL HIGH (ref 70–99)
Glucose-Capillary: 143 mg/dL — ABNORMAL HIGH (ref 70–99)

## 2024-06-26 LAB — APTT: aPTT: 47 s — ABNORMAL HIGH (ref 24–36)

## 2024-06-26 LAB — FIBRINOGEN
Fibrinogen: 238 mg/dL (ref 210–475)
Fibrinogen: 233 mg/dL (ref 210–475)

## 2024-06-26 SURGERY — TRICUSPID VALVE REPAIR
Anesthesia: General | Site: Chest

## 2024-06-26 MED ORDER — MAGNESIUM SULFATE 4 GM/100ML IV SOLN
4.0000 g | Freq: Once | INTRAVENOUS | Status: AC
Start: 2024-06-26 — End: 2024-06-27
  Administered 2024-06-26: 4 g via INTRAVENOUS
  Filled 2024-06-26: qty 100

## 2024-06-26 MED ORDER — METOPROLOL TARTRATE 5 MG/5ML IV SOLN: 2.5000 mg | INTRAVENOUS | Status: DC | PRN

## 2024-06-26 MED ORDER — CHLORHEXIDINE GLUCONATE 0.12 % MT SOLN
15.0000 mL | Freq: Once | OROMUCOSAL | Status: AC
  Administered 2024-06-26: 15 mL via OROMUCOSAL

## 2024-06-26 MED ORDER — CHLORHEXIDINE GLUCONATE 0.12 % MT SOLN
15.0000 mL | OROMUCOSAL | Status: AC
  Administered 2024-06-26: 15 mL via OROMUCOSAL
  Filled 2024-06-26: qty 15

## 2024-06-26 MED ORDER — ASPIRIN 325 MG PO TBEC
325.0000 mg | DELAYED_RELEASE_TABLET | Freq: Every day | ORAL | Status: DC
  Administered 2024-06-27 – 2024-07-04 (×8): 325 mg via ORAL
  Filled 2024-06-26 (×8): qty 1

## 2024-06-26 MED ORDER — SODIUM CHLORIDE 0.9% FLUSH
3.0000 mL | INTRAVENOUS | Status: DC | PRN
Start: 2024-06-27 — End: 2024-07-04

## 2024-06-26 MED ORDER — POTASSIUM CHLORIDE 10 MEQ/50ML IV SOLN: 10.0000 meq | INTRAVENOUS | Status: AC

## 2024-06-26 MED ORDER — INSULIN REGULAR(HUMAN) IN NACL 100-0.9 UT/100ML-% IV SOLN: INTRAVENOUS | Status: DC

## 2024-06-26 MED ORDER — CHLORHEXIDINE GLUCONATE CLOTH 2 % EX PADS
6.0000 | MEDICATED_PAD | Freq: Every day | CUTANEOUS | Status: DC
  Administered 2024-06-26 – 2024-07-03 (×8): 6 via TOPICAL

## 2024-06-26 MED ORDER — PROPOFOL 10 MG/ML IV BOLUS
INTRAVENOUS | Status: DC | PRN
  Administered 2024-06-26: 30 mg via INTRAVENOUS
  Administered 2024-06-26: 130 mg via INTRAVENOUS
  Administered 2024-06-26: 20 mg via INTRAVENOUS

## 2024-06-26 MED ORDER — MIDAZOLAM HCL 2 MG/2ML IJ SOLN: 2.0000 mg | INTRAMUSCULAR | Status: DC | PRN

## 2024-06-26 MED ORDER — LACTATED RINGERS IV SOLN: INTRAVENOUS | Status: AC

## 2024-06-26 MED ORDER — PROTAMINE SULFATE 10 MG/ML IV SOLN
INTRAVENOUS | Status: AC
  Filled 2024-06-26: qty 5

## 2024-06-26 MED ORDER — FENTANYL CITRATE (PF) 250 MCG/5ML IJ SOLN
INTRAMUSCULAR | Status: AC
  Filled 2024-06-26: qty 5

## 2024-06-26 MED ORDER — PHENYLEPHRINE 80 MCG/ML (10ML) SYRINGE FOR IV PUSH (FOR BLOOD PRESSURE SUPPORT)
PREFILLED_SYRINGE | INTRAVENOUS | Status: DC | PRN
Start: 2024-06-26 — End: 2024-06-26
  Administered 2024-06-26: 160 ug via INTRAVENOUS
  Administered 2024-06-26: 40 ug via INTRAVENOUS

## 2024-06-26 MED ORDER — SODIUM CHLORIDE 0.45 % IV SOLN: INTRAVENOUS | Status: AC | PRN

## 2024-06-26 MED ORDER — OXYCODONE HCL 5 MG PO TABS
5.0000 mg | ORAL_TABLET | ORAL | Status: DC | PRN
  Filled 2024-06-26: qty 1

## 2024-06-26 MED ORDER — 0.9 % SODIUM CHLORIDE (POUR BTL) OPTIME
TOPICAL | Status: DC | PRN
  Administered 2024-06-26: 6000 mL
  Administered 2024-06-26: 1000 mL

## 2024-06-26 MED ORDER — CEFAZOLIN SODIUM-DEXTROSE 2-4 GM/100ML-% IV SOLN
2.0000 g | Freq: Three times a day (TID) | INTRAVENOUS | Status: DC
Start: 2024-06-26 — End: 2024-06-28
  Administered 2024-06-26 – 2024-06-27 (×3): 2 g via INTRAVENOUS
  Filled 2024-06-26 (×3): qty 100

## 2024-06-26 MED ORDER — METOPROLOL TARTRATE 12.5 MG HALF TABLET: 12.5000 mg | ORAL_TABLET | Freq: Two times a day (BID) | ORAL | Status: DC

## 2024-06-26 MED ORDER — LIDOCAINE 2% (20 MG/ML) 5 ML SYRINGE
INTRAMUSCULAR | Status: DC | PRN
  Administered 2024-06-26: 50 mg via INTRAVENOUS

## 2024-06-26 MED ORDER — ASPIRIN 81 MG PO CHEW: 324.0000 mg | CHEWABLE_TABLET | Freq: Every day | ORAL | Status: DC

## 2024-06-26 MED ORDER — SODIUM CHLORIDE 0.9% FLUSH
3.0000 mL | Freq: Two times a day (BID) | INTRAVENOUS | Status: DC
  Administered 2024-06-27 – 2024-07-03 (×13): 3 mL via INTRAVENOUS

## 2024-06-26 MED ORDER — MIDAZOLAM HCL (PF) 5 MG/ML IJ SOLN
INTRAMUSCULAR | Status: DC | PRN
  Administered 2024-06-26 (×2): 2 mg via INTRAVENOUS

## 2024-06-26 MED ORDER — METOCLOPRAMIDE HCL 5 MG/ML IJ SOLN
10.0000 mg | Freq: Four times a day (QID) | INTRAMUSCULAR | Status: AC
Start: 2024-06-26 — End: 2024-06-28
  Administered 2024-06-26 – 2024-06-28 (×6): 10 mg via INTRAVENOUS
  Filled 2024-06-26 (×6): qty 2

## 2024-06-26 MED ORDER — HEPARIN SODIUM (PORCINE) 1000 UNIT/ML IJ SOLN
INTRAMUSCULAR | Status: DC | PRN
  Administered 2024-06-26: 26000 [IU] via INTRAVENOUS

## 2024-06-26 MED ORDER — SODIUM CHLORIDE (PF) 0.9 % IJ SOLN
INTRAMUSCULAR | Status: AC
  Filled 2024-06-26: qty 10

## 2024-06-26 MED ORDER — BISACODYL 10 MG RE SUPP: 10.0000 mg | Freq: Every day | RECTAL | Status: DC

## 2024-06-26 MED ORDER — METOPROLOL TARTRATE 25 MG/10 ML ORAL SUSPENSION: 12.5000 mg | Freq: Two times a day (BID) | ORAL | Status: DC

## 2024-06-26 MED ORDER — VANCOMYCIN HCL IN DEXTROSE 1-5 GM/200ML-% IV SOLN
1000.0000 mg | Freq: Two times a day (BID) | INTRAVENOUS | Status: DC
  Administered 2024-06-26: 1000 mg via INTRAVENOUS
  Filled 2024-06-26: qty 200

## 2024-06-26 MED ORDER — DEXMEDETOMIDINE HCL IN NACL 400 MCG/100ML IV SOLN
0.0000 ug/kg/h | INTRAVENOUS | Status: DC
  Administered 2024-06-26 – 2024-06-27 (×2): 0.7 ug/kg/h via INTRAVENOUS
  Filled 2024-06-26: qty 300

## 2024-06-26 MED ORDER — FENTANYL CITRATE (PF) 250 MCG/5ML IJ SOLN
INTRAMUSCULAR | Status: AC
Start: 2024-06-26 — End: 2024-06-26
  Filled 2024-06-26: qty 5

## 2024-06-26 MED ORDER — ROCURONIUM BROMIDE 10 MG/ML (PF) SYRINGE
PREFILLED_SYRINGE | INTRAVENOUS | Status: DC | PRN
  Administered 2024-06-26: 60 mg via INTRAVENOUS
  Administered 2024-06-26: 50 mg via INTRAVENOUS
  Administered 2024-06-26: 40 mg via INTRAVENOUS
  Administered 2024-06-26: 30 mg via INTRAVENOUS
  Administered 2024-06-26: 20 mg via INTRAVENOUS

## 2024-06-26 MED ORDER — PANTOPRAZOLE SODIUM 40 MG IV SOLR
40.0000 mg | Freq: Every day | INTRAVENOUS | Status: AC
  Administered 2024-06-26 – 2024-06-27 (×2): 40 mg via INTRAVENOUS
  Filled 2024-06-26 (×2): qty 10

## 2024-06-26 MED ORDER — CALCIUM CHLORIDE 10 % IV SOLN
INTRAVENOUS | Status: DC | PRN
  Administered 2024-06-26: 1 g via INTRAVENOUS

## 2024-06-26 MED ORDER — VANCOMYCIN HCL IN DEXTROSE 1-5 GM/200ML-% IV SOLN: 1000.0000 mg | Freq: Once | INTRAVENOUS | Status: DC

## 2024-06-26 MED ORDER — SODIUM CHLORIDE 0.9 % IV SOLN: INTRAVENOUS | Status: DC | PRN

## 2024-06-26 MED ORDER — ORAL CARE MOUTH RINSE: 15.0000 mL | Freq: Once | OROMUCOSAL | Status: AC

## 2024-06-26 MED ORDER — SODIUM CHLORIDE 0.9 % IV SOLN: INTRAVENOUS | Status: AC

## 2024-06-26 MED ORDER — ONDANSETRON HCL 4 MG/2ML IJ SOLN
4.0000 mg | Freq: Four times a day (QID) | INTRAMUSCULAR | Status: DC | PRN
  Administered 2024-06-27 – 2024-06-29 (×4): 4 mg via INTRAVENOUS
  Filled 2024-06-26 (×3): qty 2

## 2024-06-26 MED ORDER — COAGULATION FACTOR VIIA RECOMB 1 MG IV SOLR
45.0000 ug/kg | Freq: Once | INTRAVENOUS | Status: AC
  Administered 2024-06-26: 3000 ug via INTRAVENOUS
  Filled 2024-06-26: qty 1

## 2024-06-26 MED ORDER — DEXTROSE 50 % IV SOLN: 0.0000 mL | INTRAVENOUS | Status: DC | PRN

## 2024-06-26 MED ORDER — HEMOSTATIC AGENTS (NO CHARGE) OPTIME
TOPICAL | Status: DC | PRN
  Administered 2024-06-26: 1 via TOPICAL

## 2024-06-26 MED ORDER — PHENYLEPHRINE 80 MCG/ML (10ML) SYRINGE FOR IV PUSH (FOR BLOOD PRESSURE SUPPORT)
PREFILLED_SYRINGE | INTRAVENOUS | Status: AC
  Filled 2024-06-26: qty 10

## 2024-06-26 MED ORDER — FENTANYL CITRATE (PF) 250 MCG/5ML IJ SOLN
INTRAMUSCULAR | Status: DC | PRN
  Administered 2024-06-26: 150 ug via INTRAVENOUS
  Administered 2024-06-26 (×3): 100 ug via INTRAVENOUS
  Administered 2024-06-26: 200 ug via INTRAVENOUS
  Administered 2024-06-26: 150 ug via INTRAVENOUS
  Administered 2024-06-26: 50 ug via INTRAVENOUS
  Administered 2024-06-26 (×2): 100 ug via INTRAVENOUS
  Administered 2024-06-26: 200 ug via INTRAVENOUS
  Administered 2024-06-26: 100 ug via INTRAVENOUS

## 2024-06-26 MED ORDER — ROCURONIUM BROMIDE 10 MG/ML (PF) SYRINGE
PREFILLED_SYRINGE | INTRAVENOUS | Status: AC
  Filled 2024-06-26: qty 10

## 2024-06-26 MED ORDER — ALBUMIN HUMAN 5 % IV SOLN: INTRAVENOUS | Status: DC | PRN

## 2024-06-26 MED ORDER — CALCIUM CHLORIDE 10 % IV SOLN
INTRAVENOUS | Status: AC
  Filled 2024-06-26: qty 10

## 2024-06-26 MED ORDER — SODIUM CHLORIDE 0.9 % IV SOLN: 250.0000 mL | INTRAVENOUS | Status: DC

## 2024-06-26 MED ORDER — MIDAZOLAM HCL (PF) 10 MG/2ML IJ SOLN
INTRAMUSCULAR | Status: AC
  Filled 2024-06-26: qty 2

## 2024-06-26 MED ORDER — ACETAMINOPHEN 160 MG/5ML PO SOLN
1000.0000 mg | Freq: Four times a day (QID) | ORAL | Status: AC
  Administered 2024-06-26 – 2024-06-27 (×2): 1000 mg
  Filled 2024-06-26 (×2): qty 40.6

## 2024-06-26 MED ORDER — MORPHINE SULFATE (PF) 2 MG/ML IV SOLN
1.0000 mg | INTRAVENOUS | Status: DC | PRN
  Administered 2024-06-27: 2 mg via INTRAVENOUS
  Administered 2024-06-27: 1 mg via INTRAVENOUS
  Filled 2024-06-26 (×2): qty 1

## 2024-06-26 MED ORDER — LACTATED RINGERS IV SOLN: INTRAVENOUS | Status: DC | PRN

## 2024-06-26 MED ORDER — HEPARIN SODIUM (PORCINE) 1000 UNIT/ML IJ SOLN
INTRAMUSCULAR | Status: AC
  Filled 2024-06-26: qty 1

## 2024-06-26 MED ORDER — BISACODYL 5 MG PO TBEC
10.0000 mg | DELAYED_RELEASE_TABLET | Freq: Every day | ORAL | Status: DC
  Administered 2024-06-27 – 2024-07-03 (×5): 10 mg via ORAL
  Filled 2024-06-26 (×6): qty 2

## 2024-06-26 MED ORDER — PLASMA-LYTE A IV SOLN: INTRAVENOUS | Status: DC | PRN

## 2024-06-26 MED ORDER — PROPOFOL 10 MG/ML IV BOLUS
INTRAVENOUS | Status: AC
  Filled 2024-06-26: qty 20

## 2024-06-26 MED ORDER — PROTAMINE SULFATE 10 MG/ML IV SOLN
INTRAVENOUS | Status: AC
  Filled 2024-06-26: qty 25

## 2024-06-26 MED ORDER — LACTATED RINGERS IV SOLN: INTRAVENOUS | Status: DC

## 2024-06-26 MED ORDER — SODIUM CHLORIDE 0.9 % IV SOLN
20.0000 ug | Freq: Once | INTRAVENOUS | Status: AC
  Administered 2024-06-26: 20 ug via INTRAVENOUS
  Filled 2024-06-26: qty 5

## 2024-06-26 MED ORDER — ACETAMINOPHEN 160 MG/5ML PO SOLN
650.0000 mg | Freq: Once | ORAL | Status: AC
  Administered 2024-06-26: 650 mg
  Filled 2024-06-26: qty 20.3

## 2024-06-26 MED ORDER — ASPIRIN 81 MG PO CHEW
324.0000 mg | CHEWABLE_TABLET | Freq: Once | ORAL | Status: AC
  Administered 2024-06-26: 324 mg via ORAL
  Filled 2024-06-26: qty 4

## 2024-06-26 MED ORDER — ALBUMIN HUMAN 5 % IV SOLN
250.0000 mL | INTRAVENOUS | Status: DC | PRN
  Administered 2024-06-26 (×2): 12.5 g via INTRAVENOUS

## 2024-06-26 MED ORDER — TRAMADOL HCL 50 MG PO TABS: 50.0000 mg | ORAL_TABLET | ORAL | Status: DC | PRN

## 2024-06-26 MED ORDER — CHLORHEXIDINE GLUCONATE 0.12 % MT SOLN
OROMUCOSAL | Status: AC
  Filled 2024-06-26: qty 15

## 2024-06-26 MED ORDER — PHENYLEPHRINE HCL-NACL 20-0.9 MG/250ML-% IV SOLN
0.0000 ug/min | INTRAVENOUS | Status: DC
  Administered 2024-06-26: 80 ug/min via INTRAVENOUS
  Administered 2024-06-27: 40 ug/min via INTRAVENOUS
  Administered 2024-06-27: 50 ug/min via INTRAVENOUS
  Administered 2024-06-28: 40 ug/min via INTRAVENOUS
  Filled 2024-06-26: qty 250
  Filled 2024-06-26: qty 500
  Filled 2024-06-26: qty 250

## 2024-06-26 MED ORDER — THROMBIN (RECOMBINANT) 20000 UNITS EX SOLR
CUTANEOUS | Status: AC
Start: 2024-06-26 — End: 2024-06-26
  Filled 2024-06-26: qty 20000

## 2024-06-26 MED ORDER — NITROGLYCERIN IN D5W 200-5 MCG/ML-% IV SOLN: 0.0000 ug/min | INTRAVENOUS | Status: DC

## 2024-06-26 MED ORDER — DOCUSATE SODIUM 100 MG PO CAPS
200.0000 mg | ORAL_CAPSULE | Freq: Every day | ORAL | Status: DC
Start: 2024-06-27 — End: 2024-06-30
  Administered 2024-06-27 – 2024-06-30 (×4): 200 mg via ORAL
  Filled 2024-06-26 (×4): qty 2

## 2024-06-26 MED ORDER — PROTAMINE SULFATE 10 MG/ML IV SOLN
INTRAVENOUS | Status: DC | PRN
  Administered 2024-06-26: 220 mg via INTRAVENOUS
  Administered 2024-06-26: 40 mg via INTRAVENOUS

## 2024-06-26 MED ORDER — ACETAMINOPHEN 500 MG PO TABS
1000.0000 mg | ORAL_TABLET | Freq: Four times a day (QID) | ORAL | Status: AC
  Administered 2024-06-27 – 2024-07-01 (×18): 1000 mg via ORAL
  Filled 2024-06-26 (×18): qty 2

## 2024-06-26 MED ORDER — LIDOCAINE 2% (20 MG/ML) 5 ML SYRINGE
INTRAMUSCULAR | Status: AC
  Filled 2024-06-26: qty 5

## 2024-06-26 MED ORDER — THROMBIN 20000 UNITS EX SOLR
CUTANEOUS | Status: DC | PRN
  Administered 2024-06-26 (×3): 4 mL

## 2024-06-26 MED ORDER — PANTOPRAZOLE SODIUM 40 MG PO TBEC
40.0000 mg | DELAYED_RELEASE_TABLET | Freq: Every day | ORAL | Status: DC
  Administered 2024-06-28 – 2024-07-04 (×7): 40 mg via ORAL
  Filled 2024-06-26 (×7): qty 1

## 2024-06-26 SURGICAL SUPPLY — 104 items
ADAPTER CARDIO PERF ANTE/RETRO (ADAPTER) ×3 IMPLANT
BAG DECANTER FOR FLEXI CONT (MISCELLANEOUS) ×3 IMPLANT
BLADE CLIPPER SURG (BLADE) ×3 IMPLANT
BLADE CORE FAN STRYKER (BLADE) ×3 IMPLANT
BLADE SURG 15 STRL LF DISP TIS (BLADE) ×3 IMPLANT
BNDG ELASTIC 4INX 5YD STR LF (GAUZE/BANDAGES/DRESSINGS) IMPLANT
BNDG ELASTIC 6INX 5YD STR LF (GAUZE/BANDAGES/DRESSINGS) IMPLANT
BNDG GAUZE DERMACEA FLUFF 4 (GAUZE/BANDAGES/DRESSINGS) IMPLANT
CANISTER SUCTION 3000ML PPV (SUCTIONS) ×3 IMPLANT
CANNULA AORTIC ROOT 9FR (CANNULA) ×3 IMPLANT
CANNULA ARTERIAL NVNT 3/8 20FR (MISCELLANEOUS) IMPLANT
CANNULA GUNDRY RCSP 15FR (MISCELLANEOUS) ×3 IMPLANT
CANNULA MC2 2 STG 36/46 NON-V (CANNULA) IMPLANT
CANNULA PRFSN 3/8XCNCT RT ANG (MISCELLANEOUS) IMPLANT
CANNULA SUMP PERICARDIAL (CANNULA) ×3 IMPLANT
CANNULA VESSEL 3MM BLUNT TIP (CANNULA) IMPLANT
CANNULA VRC MALB SNGL STG 36FR (MISCELLANEOUS) IMPLANT
CATH HEART VENT LEFT (CATHETERS) ×3 IMPLANT
CATH ROBINSON RED A/P 18FR (CATHETERS) ×9 IMPLANT
CATH THORACIC 36FR (CATHETERS) ×3 IMPLANT
CATH THORACIC 36FR RT ANG (CATHETERS) ×3 IMPLANT
CAUTERY SURG HI TEMP FINE TIP (MISCELLANEOUS) IMPLANT
CNTNR URN SCR LID CUP LEK RST (MISCELLANEOUS) IMPLANT
CONN 1/2X1/2X1/2 BEN (MISCELLANEOUS) ×3 IMPLANT
CONN ST 1/4X3/8 BEN (MISCELLANEOUS) IMPLANT
CONN ST 3/8 X 1/2 (MISCELLANEOUS) ×6 IMPLANT
CONNECTOR STRAIGHT 3/8 (MISCELLANEOUS) IMPLANT
DERMABOND ADVANCED .7 DNX12 (GAUZE/BANDAGES/DRESSINGS) IMPLANT
DEVICE SUT CK QUICK LOAD INDV (Prosthesis & Implant Heart) IMPLANT
DEVICE SUT CK QUICK LOAD MINI (Prosthesis & Implant Heart) IMPLANT
DRAPE SRG 135X102X78XABS (DRAPES) ×3 IMPLANT
DRAPE WARM FLUID 44X44 (DRAPES) ×3 IMPLANT
DRSG COVADERM 4X14 (GAUZE/BANDAGES/DRESSINGS) ×3 IMPLANT
ELECT CAUTERY BLADE 6.4 (BLADE) ×3 IMPLANT
ELECTRODE REM PT RTRN 9FT ADLT (ELECTROSURGICAL) ×6 IMPLANT
FELT TEFLON 1X6 (MISCELLANEOUS) ×6 IMPLANT
GAUZE 4X4 16PLY ~~LOC~~+RFID DBL (SPONGE) ×3 IMPLANT
GAUZE SPONGE 4X4 12PLY STRL (GAUZE/BANDAGES/DRESSINGS) ×6 IMPLANT
GLOVE BIO SURGEON STRL SZ 6 (GLOVE) IMPLANT
GLOVE BIO SURGEON STRL SZ 6.5 (GLOVE) IMPLANT
GLOVE BIO SURGEON STRL SZ7 (GLOVE) IMPLANT
GOWN STRL REUS W/ TWL LRG LVL3 (GOWN DISPOSABLE) ×12 IMPLANT
GOWN STRL REUS W/ TWL XL LVL3 (GOWN DISPOSABLE) ×3 IMPLANT
HEMOSTAT POWDER SURGIFOAM 1G (HEMOSTASIS) ×9 IMPLANT
HEMOSTAT SURGICEL 2X14 (HEMOSTASIS) ×3 IMPLANT
INSERT FOGARTY XLG (MISCELLANEOUS) IMPLANT
IV CATH 22GX1 FEP (IV SOLUTION) IMPLANT
KIT BASIN OR (CUSTOM PROCEDURE TRAY) ×3 IMPLANT
KIT CATH CPB BARTLE (MISCELLANEOUS) ×3 IMPLANT
KIT SUCTION CATH 14FR (SUCTIONS) ×3 IMPLANT
KIT SUT CK MINI COMBO 4X17 (Prosthesis & Implant Heart) IMPLANT
KIT TURNOVER KIT B (KITS) ×3 IMPLANT
KIT VASOVIEW HEMOPRO 2 VH 4000 (KITS) IMPLANT
LINE VENT (MISCELLANEOUS) IMPLANT
LOOP VASCLR EXTRA MAXI WHITE (MISCELLANEOUS) ×3 IMPLANT
LOOPS VASCLR EXTRA MAXI WHITE (MISCELLANEOUS) ×2 IMPLANT
NS IRRIG 1000ML POUR BTL (IV SOLUTION) ×15 IMPLANT
PACK E OPEN HEART (SUTURE) ×3 IMPLANT
PACK OPEN HEART (CUSTOM PROCEDURE TRAY) ×3 IMPLANT
PAD ARMBOARD POSITIONER FOAM (MISCELLANEOUS) ×6 IMPLANT
PAD DEFIB R2 (MISCELLANEOUS) ×3 IMPLANT
PAD ELECT DEFIB RADIOL ZOLL (MISCELLANEOUS) ×3 IMPLANT
POSITIONER HEAD DONUT 9IN (MISCELLANEOUS) ×3 IMPLANT
PUNCH AORTIC ROTATE 4.5MM 8IN (MISCELLANEOUS) IMPLANT
SEALANT HEMOST PREVELEAK 4ML (HEMOSTASIS) IMPLANT
SENSOR MYOCARDIAL TEMP (MISCELLANEOUS) IMPLANT
SET MPS 3-ND DEL (MISCELLANEOUS) IMPLANT
SOLUTION ANTFG W/FOAM PAD STRL (MISCELLANEOUS) IMPLANT
SPONGE T-LAP 18X18 ~~LOC~~+RFID (SPONGE) ×12 IMPLANT
SPONGE T-LAP 4X18 ~~LOC~~+RFID (SPONGE) ×3 IMPLANT
SUT BONE WAX W31G (SUTURE) ×3 IMPLANT
SUT EB EXC GRN/WHT 2-0 V-5 (SUTURE) ×6 IMPLANT
SUT ETHIBON EXCEL 2-0 V-5 (SUTURE) IMPLANT
SUT ETHIBOND 2 0 SH (SUTURE) IMPLANT
SUT ETHIBOND 2 0 SH 36X2 (SUTURE) IMPLANT
SUT ETHIBOND 2 0 V4 (SUTURE) IMPLANT
SUT ETHIBOND 2 0V4 GREEN (SUTURE) IMPLANT
SUT ETHIBOND V-5 VALVE (SUTURE) IMPLANT
SUT MNCRL AB 4-0 PS2 18 (SUTURE) IMPLANT
SUT PROLENE 3 0 SH 48 (SUTURE) ×3 IMPLANT
SUT PROLENE 3 0 SH1 36 (SUTURE) ×3 IMPLANT
SUT PROLENE 4 0 SH DA (SUTURE) IMPLANT
SUT PROLENE 4-0 RB1 .5 CRCL 36 (SUTURE) ×6 IMPLANT
SUT PROLENE 5 0 C 1 36 (SUTURE) IMPLANT
SUT PROLENE 7 0 BV 1 (SUTURE) IMPLANT
SUT SILK 1 MH (SUTURE) IMPLANT
SUT SILK 2 0 SH CR/8 (SUTURE) IMPLANT
SUT STEEL 6MS V (SUTURE) IMPLANT
SUT VIC AB 1 CTX36XBRD ANBCTR (SUTURE) ×6 IMPLANT
SUT VIC AB 2-0 CT1 TAPERPNT 27 (SUTURE) IMPLANT
SUTURE EB EXC GRN/WHT 2-0 D/A (SUTURE) IMPLANT
SYSTEM SAHARA CHEST DRAIN ATS (WOUND CARE) ×3 IMPLANT
TAPE CLOTH SURG 4X10 WHT LF (GAUZE/BANDAGES/DRESSINGS) IMPLANT
TAPE PAPER 2X10 WHT MICROPORE (GAUZE/BANDAGES/DRESSINGS) IMPLANT
TOWEL GREEN STERILE (TOWEL DISPOSABLE) ×3 IMPLANT
TOWEL GREEN STERILE FF (TOWEL DISPOSABLE) ×3 IMPLANT
TRAY FOLEY SLVR 16FR TEMP STAT (SET/KITS/TRAYS/PACK) ×3 IMPLANT
TUBE SUCT INTRACARD DLP 20F (MISCELLANEOUS) ×3 IMPLANT
TUBE SUCTION CARDIAC 10FR (CANNULA) IMPLANT
TUBING LAP HI FLOW INSUFFLATIO (TUBING) IMPLANT
UNDERPAD 30X36 HEAVY ABSORB (UNDERPADS AND DIAPERS) ×3 IMPLANT
VALVE AORTIC KONECT RESILIA 21 (Valve) IMPLANT
VALVE MITRAL SZ 27 (Prosthesis & Implant Heart) IMPLANT
WATER STERILE IRR 1000ML POUR (IV SOLUTION) ×6 IMPLANT

## 2024-06-26 NOTE — Progress Notes (Signed)
 Patient to undergo redo of aortic valve replacement, tricuspid valve repair today with CT surgery.  Postop will require ICU care.  Patient will be transferred to CT surgery service, if necessary consult PCCM while in the ICU.  Consult TRH if necessary once out of the ICU Kyle Matthews

## 2024-06-26 NOTE — Interval H&P Note (Signed)
 History and Physical Interval Note:  06/26/2024 6:32 AM  Kyle Matthews  has presented today for surgery, with the diagnosis of bioprosthetic aortic valve endocarditis tricupsid valve.  The various methods of treatment have been discussed with the patient and family. After consideration of risks, benefits and other options for treatment, the patient has consented to  Procedure(s) with comments: REDO AORTIC VALVE REPLACEMENT (AVR) (N/A) TRICUSPID VALVE REPAIR (N/A) - Possible tricupsid valve repair or replacement ECHOCARDIOGRAM, TRANSESOPHAGEAL (N/A) as a surgical intervention.  The patient's history has been reviewed, patient examined, no change in status, stable for surgery.  I have reviewed the patient's chart and labs.  Questions were answered to the patient's satisfaction.     [name]

## 2024-06-26 NOTE — Progress Notes (Signed)
 No rapid wean to extubate tonight. Stay intubated overnight per MD Bartle. Primary RN made aware. ETT holder placed. Patient is stable at this time.   Benzion Mesta L. Claudene, BS, RRT-ACCS, RCP

## 2024-06-26 NOTE — Transfer of Care (Signed)
 Immediate Anesthesia Transfer of Care Note  Patient: Kyle Matthews  Procedure(s) Performed: TRICUSPID VALVE REPLACEMENT USING MOSIAC BRIOPROSTHESIS VALVE SIZE (Chest) ECHOCARDIOGRAM, TRANSESOPHAGEAL CORONARY ARTERY BYPASS GRAFTING (CABG) TIMES ONE USING ENDOSCOPICALLY HARVESTED LEFT GREATER SAPHENOUS VEIN (Chest) BENTALL PROCEDURE USING KONECT AORTIC VALVE CONDUIT SIZE (Chest) REDO STERNOTOMY (Chest)  Patient Location: SICU  Anesthesia Type:General  Level of Consciousness: Patient remains intubated per anesthesia plan  Airway & Oxygen Therapy: Patient remains intubated per anesthesia plan and Patient placed on Ventilator (see vital sign flow sheet for setting)  Post-op Assessment: Report given to RN and Post -op Vital signs reviewed and stable  Post vital signs: Reviewed and stable  Last Vitals:  Vitals Value Taken Time  BP 94/61 06/26/24 19:44  Temp 36.2 C 06/26/24 19:46  Pulse 80 06/26/24 19:46  Resp 16 06/26/24 19:46  SpO2 100 % 06/26/24 19:46  Vitals shown include unfiled device data.  Last Pain:  Vitals:   06/26/24 0702  TempSrc:   PainSc: 0-No pain      Patients Stated Pain Goal: 0 (06/25/24 0931)  Complications: No notable events documented.

## 2024-06-26 NOTE — Anesthesia Procedure Notes (Signed)
 Arterial Line Insertion Start/End7/08/2024 7:40 AM, 06/26/2024 7:50 AM Performed by: Maryclare Cornet, MD, Cindie Donald CROME, CRNA, CRNA  Patient location: Pre-op. Preanesthetic checklist: patient identified, IV checked, site marked, risks and benefits discussed, surgical consent, monitors and equipment checked, pre-op evaluation, timeout performed and anesthesia consent Lidocaine  1% used for infiltration Right, radial was placed Catheter size: 20 G Hand hygiene performed  and maximum sterile barriers used   Attempts: 2 Procedure performed without using ultrasound guided technique. Following insertion, dressing applied and Biopatch. Post procedure assessment: normal and unchanged  Patient tolerated the procedure well with no immediate complications. Additional procedure comments: 1 attempt by SRNA with U/S. 1 attempt by CRNA without U/S. SABRA

## 2024-06-26 NOTE — Brief Op Note (Signed)
 06/12/2024 - 06/26/2024  5:08 PM  PATIENT:  Kyle Matthews  78 y.o. male  PRE-OPERATIVE DIAGNOSIS:  bioprosthetic aortic valve endocarditis, tricupsid valve endocarditis  POST-OPERATIVE DIAGNOSIS:  bioprosthetic aortic valve endocarditis, tricupsid valve endocarditis  PROCEDURE:  REDO AORTIC VALVE REPLACEMENT (AVR) USING KONECT AORTIC VALVE CONDUIT SIZE  TRICUSPID VALVE REPLACEMENT USING MOSIAC BRIOPROSTHESIS VALVE SIZE  ECHOCARDIOGRAM, TRANSESOPHAGEAL CORONARY ARTERY BYPASS GRAFTING (CABG) TIMES ONE USING ENDOSCOPICALLY HARVESTED LEFT GREATER SAPHENOUS VEIN Vein harvest time: Vein prep time:  SURGEON:  Surgeons and Role:    * Lucas Dorise POUR, MD - Primary  PHYSICIAN ASSISTANT: Laurel Becket PA-C, Con Bend PA-C  ASSISTANTS: Luke Mayotte RNFA   ANESTHESIA:   general  EBL: Per perfusion records  BLOOD ADMINISTERED: 2U FFP, 2U Cryo, 2U Plt  DRAINS: Mediastinal drains   LOCAL MEDICATIONS USED:  NONE  SPECIMEN:  Source of Specimen:  aortic valve, aortic annulus, tricuspid valve  DISPOSITION OF SPECIMEN:  pathology, culture, AFB  COUNTS:  YES  TOURNIQUET:  * No tourniquets in log *  DICTATION: .Dragon Dictation  PLAN OF CARE: Admit to inpatient   PATIENT DISPOSITION:  ICU - intubated and hemodynamically stable.   Delay start of Pharmacological VTE agent (>24hrs) due to surgical blood loss or risk of bleeding: yes

## 2024-06-26 NOTE — Progress Notes (Signed)
 eLink Physician-Brief Progress Note Patient Name: Kyle Matthews DOB: 16-Dec-1946 MRN: [id number]   Date of Service  06/26/2024  HPI/Events of Note  Patient transferred to the ICU with post-operative respiratory failure following  tricuspid valve surgery and CABG.  eICU Interventions  New Patient Evaluation.        [name] [name] 06/26/2024, 8:10 PM

## 2024-06-26 NOTE — Op Note (Signed)
 CARDIOVASCULAR SURGERY OPERATIVE NOTE  06/26/2024  Surgeon:  Dorise LOIS Fellers, MD  First Assistant: Laurel Becket,  PA-C and Con Helm, PA-C: An experienced assistant was required given the complexity of this surgery and the standard of surgical care. The assistant was needed for exposure, dissection, suctioning, retraction of delicate tissues and sutures, instrument exchange and for overall help during this procedure.    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   Procedure:  Redo Median Sternotomy Extracorporeal circulation 3.   Bentall Procedure using a 21 mm KONECT RESILIA pericardial valved conduit. Reimplantation of left coronary artery and ligation of right coronary artery. 4.   CABG x 1 using SVG to RCA. 5.   Tricuspid valve replacement using a 27 mm Medtronic Mosaic porcine valve.  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 Kyle Matthews had an indwelling foley for 9 months which was just removed a couple weeks ago after prostatectomy. MRI brain showed a small acute occipital infarct with no deficit. Kyle Matthews has clinically responded to antibiotics with no signs of sepsis and last set of BC from 6/29 are negative after + BC on 6/25 and 6/27. Kyle Matthews has mobile vegetation on the aortic valve measured at 1.15 x 1.29 cm with an increase in the valve gradient from 12 postop to 28 on 2D echo and 20 by TEE. No sign of perivalvular abscess. No AI, moderate TR with mobile vegetation. No sign of PE or septic pulmonary emboli on CT. Kyle Matthews looks good clinically. I think there are certainly multiple indications for redo AVR in this  pt including likely embolic stroke with mobile vegetation > 1cm, prosthetic valve infection with resistant bacteria, increased valve gradient. His operative risk is certainly increased at 75 but still fairly low and I think his risk for further complications with only antibiotic therapy are higher and long term results less predictable. Tricuspid valve may also require replacement depending on intra-op findings. Preop cath showed 40-50% ostial LM stenosis and calcified ostium of RCA with mild luminal irregularities. The LIMA to the LAD and SVG to Ramus are both patent. I discussed the operative procedure with the patient and wife including alternatives, benefits and risks; including but not limited to bleeding, blood transfusion, infection, stroke, myocardial infarction, graft failure, heart block requiring a permanent pacemaker, organ dysfunction, and death.  Kyle Matthews understands and agrees to proceed.    Preparation:  The patient was seen in the preoperative holding area and the correct patient, correct operation were confirmed with the patient after reviewing the medical record and catheterization. The consent was signed by me. Preoperative antibiotics were given. A pulmonary arterial line and radial arterial line were placed by the anesthesia team. The patient was taken back to the operating room and positioned supine on the operating room table. After being placed under general endotracheal anesthesia by the anesthesia team a foley catheter was placed. The neck, chest, abdomen, and both legs were prepped with betadine soap and solution and draped in the usual sterile manner. A surgical time-out was taken and the correct patient and operative procedure were confirmed with the nursing and anesthesia staff.  TEE: performed by Dr. Maryclare. This showed mobile vegetation on the aortic valve with mild AS, no AI.  There was lucency around the aortic annulus along the non-coronary sinus suggesting possible  peri-annular abscess. The mitral valve looked ok with no vegetation, trivial MR. There was mobile vegetation on the tricuspid valve septal leaflet with some lucency around the annulus in this area. LV and RV systolic function normal.  Cardiopulmonary Bypass:  A redo median sternotomy was performed using the oscillating saw. Bone hooks were used to retract the sternal edges and the heart and mediastinal structures were separated from the pericardium. The ascending aorta was of normal size and had no palpable plaque. There were no contraindications to aortic cannulation or cross-clamping. Dissection was performed to expose the right atrium and ascending aorta. There was edematous tissue along the aorta adjacent to the right atrial appendage. The patient was fully systemically heparinized and the ACT was maintained > 400 sec. The proximal aortic arch was cannulated with a 20 F aortic cannula for arterial inflow. Bi-caval venous cannulation was performed via the SVC using a 70F metal tip right angle cannula and the IVC using a 36 F plastic malleable cannula. The SVC was mobilized from the right pulmonary artery and tapes passed around the SVC and IVC.  An antegrade cardioplegia/vent cannula was inserted into the mid-ascending aorta. Aortic occlusion was performed with a single cross-clamp. Systemic cooling to 32 degrees Centigrade and topical cooling of the heart with iced saline were used. Dissection was performed to isolate the LIMA graft as it entered the pericardium and it was controlled with a plastic atraumatic vascular bull dog clamp. Cold antegrade KBC cardioplegia was used to induce diastolic arrest and was then given retrograde at about 60 minute intervals throughout the period of arrest to maintain myocardial temperature at or below 10 degrees centigrade. A temperature probe was inserted into the interventricular septum. CO2 was insufflated into the pericardium throughout the case to minimize intracardiac  air.  Endoscopic vein harvest: Performed by Con Helm, PA-C  The left greater saphenous vein was harvested endoscopically through a 2 cm incision medial to the left knee. It was harvested from the upper thigh to the knee. It was a medium-sized vein of good quality. The side branches were all ligated with 4-0 silk ties.    Bentall Procedure: Assisted by Laurel Becket, PA-C.   The ascending aorta was mobilized from the right pulmonary artery and main PA. It was opened through the previous aortotomy and the valve inspected. The valve had a large amount of vegetation on it with thickened leaflets. The non-coronary annulus was edematous and mushy. The non-coronary sinus was gone with abscess formation and the wall of the right atrium at its depth. The old prosthetic valve was removed and sent for culture. The annulus was debrided and sent for culture. The right and left coronary arteries were removed from the aortic root with a button of aortic wall around the ostia. The right coronary ostium was encircled with bulky calcium  and could not be anastomosed to the graft. Therefore I thought the best option was to bypass the RCA and ligate the ostium distal to the calcium . They left main button retracted carefully out of the way with stay sutures to prevent rotation. The annulus was sized and a 21 mm KONECT RESILIA pericardial Valved Graft was chosen. ( Model # [id number], Serial # [id number]) A series of pledgetted 2-0 Ethibond horizontal mattress sutures were placed around the annulus with the pledgets in a sub-annular position. The valve was lowered into place and the sutures tied. The valve seated nicely. A  small opening was made in the graft for the left coronary anastomosis using a thermal cautery. Then the left coronary button was anastomosed to the graft in an end to side manner using continuous 5-0 prolene suture. A light coating of prevaleak was applied to the anastomosis for hemostasis. The the graft  was cut to the appropriate length and anastomosed end to end to the ascending aorta using continuous 3-0 prolene suture with a felt strip to reinforce the aorta. Prevaleak was applied to seal the needle holes in the grafts.  Grafts: Assisted by Laurel Becket, PA-C  SVG to RCA:  2.0 mm. It was sewn end to side using 7-0 prolene continuous suture.  The proximal vein graft anastomosis was performed to the ascending aortic graft using continuous 6-0 prolene suture. A graft marker was placed around the proximal anastomosis.  Tricuspid Valve Replacement: Assisted by Laurel Becket, PA-C  The right atrium was opened obliquely. A Koros retractor was used to expose the valve and exposure was excellent. There was a large vegetation on the septal leaflets with bulging and discolorationof the annulus adjacent to the septal leaflet. I thought this was likely extension of the aortic annular abscess to the tricuspid annulus. The septal leaflet was removed and the annulus debrided and this tissue was sent for culture.This resulted in an opening into the old non-coronary sinus that was destroyed. The anterior and posterior leaflets looked normal.  The annulus was sized and a 27mm Medtronic Mosaic porcine valve was chosen. This had model number 310 and serial number [id number]. Then pledgetted 2-0 Ethibond sutures were placed around the annulus with the pledgets in a sub-annular position. The sutures along the septal leaflet were used to reapproximate the subannular ventricular tissue with the right atrial wall to close the defect from debridement. The sutures were placed through the valve sewing ring and the valve lowered into place. The sutures were tied using CorKnots and the valve seated nicely. The atrium was closed in two layers with 4-0 prolene suture.   Completion:  The patient was rewarmed to 37 degrees Centigrade. A dose of warm reanimation retrograde cardioplegia was given.  The crossclamp was removed  with a time of 334 minutes. There was spontaneous return of slow junctional rhythm. Two temporary epicardial pacing wires were placed on the right atrium and two on the right ventricle. The patient was weaned from CPB without difficulty on low dose Epinephrine . CPB time was 386 minutes. TEE showed a normally functioning prosthetic aortic and tricuspid valves. LV systolic function was normal.  Heparin  was fully reversed with protamine  and the aortic and venous cannulas removed. Hemostasis was achieved but the patient was coagulopathic and given FFP, platelets, cryo, DDAVP  and 1/2 dose of NovoSeven .  Mediastinal and bilateral pleural drainage tubes were placed. The sternum was closed with #6 stainless steel wires. The fascia was closed with continuous # 1 vicryl suture. The subcutaneous tissue was closed with 2-0 vicryl continuous suture. The skin was closed with 3-0 vicryl subcuticular suture. All sponge, needle, and instrument counts were reported correct at the end of the case. Dry sterile dressings were placed over the incisions and around the chest tubes which were connected to pleurevac suction. The patient was then transported to the surgical intensive care unit in critical but stable condition.

## 2024-06-26 NOTE — Anesthesia Procedure Notes (Signed)
 Procedure Name: Intubation Date/Time: 06/26/2024 8:40 AM  Performed by: Cindie Donald CROME, CRNAPre-anesthesia Checklist: Patient identified, Emergency Drugs available, Suction available and Patient being monitored Patient Re-evaluated:Patient Re-evaluated prior to induction Oxygen Delivery Method: Circle System Utilized Preoxygenation: Pre-oxygenation with 100% oxygen Induction Type: IV induction Ventilation: Mask ventilation without difficulty Laryngoscope Size: Miller and 3 Grade View: Grade I Tube type: Oral Tube size: 8.0 mm Number of attempts: 1 Airway Equipment and Method: Stylet and Oral airway Placement Confirmation: ETT inserted through vocal cords under direct vision, positive ETCO2 and breath sounds checked- equal and bilateral Secured at: 23 cm Tube secured with: Tape Dental Injury: Teeth and Oropharynx as per pre-operative assessment

## 2024-06-26 NOTE — Anesthesia Postprocedure Evaluation (Signed)
 Anesthesia Post Note  Patient: Roye Gustafson  Procedure(s) Performed: TRICUSPID VALVE REPLACEMENT USING MOSIAC BRIOPROSTHESIS VALVE SIZE (Chest) ECHOCARDIOGRAM, TRANSESOPHAGEAL CORONARY ARTERY BYPASS GRAFTING (CABG) TIMES ONE USING ENDOSCOPICALLY HARVESTED LEFT GREATER SAPHENOUS VEIN (Chest) BENTALL PROCEDURE USING KONECT AORTIC VALVE CONDUIT SIZE (Chest) REDO STERNOTOMY (Chest)     Patient location during evaluation: SICU Anesthesia Type: General Level of consciousness: sedated and patient remains intubated per anesthesia plan Pain management: pain level controlled Vital Signs Assessment: post-procedure vital signs reviewed and stable Respiratory status: patient remains intubated per anesthesia plan and patient on ventilator - see flowsheet for VS Cardiovascular status: stable Anesthetic complications: no   No notable events documented.  Last Vitals:  Vitals:   06/26/24 0814 06/26/24 2007  BP:    Pulse: 65   Resp: 13   Temp:    SpO2: 98% 97%    Last Pain:  Vitals:   06/26/24 0702  TempSrc:   PainSc: 0-No pain                 Norleen Pope

## 2024-06-26 NOTE — Anesthesia Procedure Notes (Signed)
 Central Venous Catheter Insertion Performed by: Maryclare Cornet, MD, anesthesiologist Start/End7/08/2024 7:57 AM, 06/26/2024 8:02 AM Patient location: Pre-op. Preanesthetic checklist: patient identified, IV checked, site marked, risks and benefits discussed, surgical consent, monitors and equipment checked, pre-op evaluation, timeout performed and anesthesia consent Hand hygiene performed  and maximum sterile barriers used  PA cath was placed.Swan type:thermodilution Procedure performed without using ultrasound guided technique. Attempts: 1 Patient tolerated the procedure well with no immediate complications.

## 2024-06-26 NOTE — Hospital Course (Addendum)
 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  He is status post aortic valve replacement with a 23 mm Inspiris bioprosthetic valve along with two-vessel coronary bypass grafting with the left internal mammary artery grafted to the left anterior descending coronary artery and saphenous vein grafted to the ramus intermediate coronary artery by Dr. Lucas in November 2024.  He recently underwent robot-assisted prostatectomy by Dr. Patrcia on 05/22/2024.  Prior to that, he had a Foley catheter in place continuously for about 9 months due to frequent hematuria related to his prostate disease.    Kyle Matthews was brought to the emergency room at [facility] via EMS on 06/12/2024 with primary complaint of midsternal chest pain and chills.  Kyle Matthews reported his chest pain had been present for about 2 weeks, was unrelated to exertion and would last for several hours and then resolve spontaneously.  He also noted lower extremity edema about 2 weeks duration and frequent recurring chills with episodes lasting 15 to 20 minutes.    Workup in the emergency room included an initial troponin level of 99 with repeat level of 86.  CTA chest was negative for pulmonary embolus.  EKG did not show any acute ischemic changes.  He was admitted to the hospital and cardiology was consulted.  Kyle Matthews was seen by Dr. Alvan and echocardiography was recommended.  In the interim, an EEG was also performed to rule out seizure activity as a cause for the patient's shaking episodes. He went on to have an MRI showing a small acute CVA in the left occipital lobe with a concurrent MRA showing no associated vascular occlusion.  By  the second hospital day, blood cultures that had been drawn  on admission resulted positive for Staphylococcus epidermidis.  The transthoracic echocardiogram showed elevated gradients across the prosthetic aortic valve and thus transesophageal echo was recommended.  The TEE was performed earlier today by Dr. Oneil Parchment showing left ventricular ejection fraction of 65 to 70%.  The mitral valve had mild annular calcification but no MR.  The prosthetic aortic valve had a 1.15 x 1.29 x 1.66 cm mobile vegetation with a mean aortic valve gradient of 20 mmHg.  The tricuspid valve had a mobile density measuring 1.55 x 0.45 cm with moderate TR.   Addendum per Dr. 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 He had an indwelling foley for 9 months which was just removed a couple weeks ago after prostatectomy. MRI brain showed a small acute occipital infarct with no deficit. He has clinically responded to antibiotics with no signs of sepsis and last set of BC from 6/29 are negative after + BC on 6/25 and 6/27. He has mobile vegetation on the aortic valve measured at 1.15 x 1.29 cm with an increase in the valve gradient from 12 postop to 28 on 2D echo and 20 by TEE. No sign of perivalvular abscess. No AI, moderate TR with mobile vegetation. No sign of PE or septic pulmonary emboli on CT. He looks good clinically. I think there are certainly multiple indications for redo AVR in this pt including likely embolic stroke with mobile vegetation > 1cm, prosthetic valve infection with resistant bacteria,  increased valve gradient. His operative risk is certainly increased at 75 but still fairly low and I think his risk for further complications with only antibiotic therapy are higher and long term results less predictable. Tricuspid valve may also require replacement depending on intra-op findings. He would require preop cath. I discussed all of this with him and he is  going to discuss with his wife. I will check back with him tomorrow to see what other questions come up.   Hospital Course: Kyle Matthews was treated with IV vancomycin  for methicillin staph epidermidis and he remained stable clinically. Repeat blood cultures on 06/16/24 had no growth.  Left heart catheterization was performed showing all coronary grafts to be patent and no new coronary stenoses. He was taken to the OR electively on 06/26/24 for re-do sternotomy. The patient was discovered to have an aortic subvavlular abscess and significant destruction of the septal leaflet of the tricuspid valve. This required aortic root replacement with a 21mm Konect bioprosthetic valved conduit, tricuspid valve replacement with a 27mm Mosaic bioprosthetic valve, and single-vessel coronary artery bypass grafting (SVG->distal RCA).  Following procedure, he separated from cardiopulmonary bypass without difficulty and is was reasonably hemostatic.  He was transferred to the surgical ICU in stable condition.  He was maintained on the ventilator overnight for stability.  He remained hemodynamically stable on low-dose Neo-Synephrine.  On postop day 1 the vent was weaned and he was extubated at around 9 AM.  He was started on midodrine  on the following day to aid with weaning of the Neo-Synephrine.  He had expected complete heart block after surgery due to the extensive bradycardia required for both the aortic and tricuspid valve replacements.  We were able to maintain the rhythm was pacing utilizing the epicardial pacing leads.  The EP service was consulted for eventual placement of a permanent pacemaker.  He developed an acute kidney injury after surgery with oliguria.  The nephrology team was consulted.  He was given a Lasix  challenge 80 mg IV with about 400 mL of urine.  By postop day 4, urine output was continuing to show improvement and creatinine was trending down after peak creatinine of 3.6 on postop day 3.  Patient was followed by  advanced heart failure team and infectious disease after surgery.  He was maintained on vancomycin  early postoperatively but transitioned to daptomycin  per ID recommendations.

## 2024-06-26 NOTE — Anesthesia Preprocedure Evaluation (Signed)
 Anesthesia Evaluation  Patient identified by MRN, date of birth, ID band Patient awake    Reviewed: Allergy & Precautions, H&P , NPO status , Patient's Chart, lab work & pertinent test results  Airway Mallampati: II   Neck ROM: full    Dental   Pulmonary Current Smoker and Patient abstained from smoking.   breath sounds clear to auscultation       Cardiovascular hypertension, + CAD, + Past MI, + CABG and +CHF  + Valvular Problems/Murmurs AS  Rhythm:regular Rate:Normal  S/p AVR and CABG 10/2023.    Pt now has vegetations on aortic valve and tricuspid valve.  LV and RV function is normal.  AV mean PG 20 mmHg.   Neuro/Psych   Anxiety Depression    CVA    GI/Hepatic ,GERD  ,,  Endo/Other    Renal/GU      Musculoskeletal  (+) Arthritis ,    Abdominal   Peds  Hematology   Anesthesia Other Findings   Reproductive/Obstetrics                              Anesthesia Physical Anesthesia Plan  ASA: 3  Anesthesia Plan: General   Post-op Pain Management:    Induction: Intravenous  PONV Risk Score and Plan: 1 and Ondansetron , Dexamethasone , Midazolam  and Treatment may vary due to age or medical condition  Airway Management Planned: Oral ETT  Additional Equipment: Arterial line, CVP, PA Cath, TEE and Ultrasound Guidance Line Placement  Intra-op Plan:   Post-operative Plan: Post-operative intubation/ventilation  Informed Consent: I have reviewed the patients History and Physical, chart, labs and discussed the procedure including the risks, benefits and alternatives for the proposed anesthesia with the patient or authorized representative who has indicated his/her understanding and acceptance.     Dental advisory given  Plan Discussed with: CRNA, Anesthesiologist and Surgeon  Anesthesia Plan Comments:         Anesthesia Quick Evaluation

## 2024-06-26 NOTE — Anesthesia Procedure Notes (Signed)
 Central Venous Catheter Insertion Performed by: Maryclare Cornet, MD, anesthesiologist Start/End7/08/2024 7:45 AM, 06/26/2024 7:57 AM Patient location: Pre-op. Preanesthetic checklist: patient identified, IV checked, site marked, risks and benefits discussed, surgical consent, monitors and equipment checked, pre-op evaluation, timeout performed and anesthesia consent Lidocaine  1% used for infiltration and patient sedated Hand hygiene performed  and maximum sterile barriers used  Catheter size: 9 Fr MAC introducer Procedure performed using ultrasound guided technique. Ultrasound Notes:anatomy identified, needle tip was noted to be adjacent to the nerve/plexus identified, no ultrasound evidence of intravascular and/or intraneural injection and image(s) printed for medical record Attempts: 1 Following insertion, line sutured and dressing applied. Post procedure assessment: blood return through all ports, free fluid flow and no air  Patient tolerated the procedure well with no immediate complications.

## 2024-06-27 ENCOUNTER — Inpatient Hospital Stay (HOSPITAL_COMMUNITY)

## 2024-06-27 ENCOUNTER — Encounter (HOSPITAL_COMMUNITY): Payer: Self-pay | Admitting: Surgery

## 2024-06-27 DIAGNOSIS — Z952 Presence of prosthetic heart valve: Secondary | ICD-10-CM | POA: Diagnosis not present

## 2024-06-27 DIAGNOSIS — I330 Acute and subacute infective endocarditis: Secondary | ICD-10-CM | POA: Diagnosis not present

## 2024-06-27 DIAGNOSIS — R7881 Bacteremia: Secondary | ICD-10-CM | POA: Diagnosis not present

## 2024-06-27 DIAGNOSIS — T826XXA Infection and inflammatory reaction due to cardiac valve prosthesis, initial encounter: Secondary | ICD-10-CM | POA: Diagnosis not present

## 2024-06-27 LAB — POCT I-STAT 7, (LYTES, BLD GAS, ICA,H+H)
Acid-base deficit: 6 mmol/L — ABNORMAL HIGH (ref 0.0–2.0)
Acid-base deficit: 7 mmol/L — ABNORMAL HIGH (ref 0.0–2.0)
Acid-base deficit: 2 mmol/L (ref 0.0–2.0)
Bicarbonate: 19.1 mmol/L — ABNORMAL LOW (ref 20.0–28.0)
Bicarbonate: 18.3 mmol/L — ABNORMAL LOW (ref 20.0–28.0)
Bicarbonate: 22.9 mmol/L (ref 20.0–28.0)
Calcium, Ion: 1.2 mmol/L (ref 1.15–1.40)
Calcium, Ion: 1.15 mmol/L (ref 1.15–1.40)
Calcium, Ion: 1.16 mmol/L (ref 1.15–1.40)
HCT: 24 % — ABNORMAL LOW (ref 39.0–52.0)
HCT: 22 % — ABNORMAL LOW (ref 39.0–52.0)
HCT: 23 % — ABNORMAL LOW (ref 39.0–52.0)
Hemoglobin: 8.2 g/dL — ABNORMAL LOW (ref 13.0–17.0)
Hemoglobin: 7.5 g/dL — ABNORMAL LOW (ref 13.0–17.0)
Hemoglobin: 7.8 g/dL — ABNORMAL LOW (ref 13.0–17.0)
O2 Saturation: 98 %
O2 Saturation: 91 %
O2 Saturation: 92 %
Patient temperature: 37.1
Patient temperature: 97.8
Patient temperature: 36.8
Potassium: 5 mmol/L (ref 3.5–5.1)
Potassium: 4.5 mmol/L (ref 3.5–5.1)
Potassium: 4.7 mmol/L (ref 3.5–5.1)
Sodium: 138 mmol/L (ref 135–145)
Sodium: 141 mmol/L (ref 135–145)
Sodium: 139 mmol/L (ref 135–145)
TCO2: 20 mmol/L — ABNORMAL LOW (ref 22–32)
TCO2: 19 mmol/L — ABNORMAL LOW (ref 22–32)
TCO2: 24 mmol/L (ref 22–32)
pCO2 arterial: 36.5 mmHg (ref 32–48)
pCO2 arterial: 31.7 mmHg — ABNORMAL LOW (ref 32–48)
pCO2 arterial: 40.4 mmHg (ref 32–48)
pH, Arterial: 7.327 — ABNORMAL LOW (ref 7.35–7.45)
pH, Arterial: 7.366 (ref 7.35–7.45)
pH, Arterial: 7.361 (ref 7.35–7.45)
pO2, Arterial: 109 mmHg — ABNORMAL HIGH (ref 83–108)
pO2, Arterial: 60 mmHg — ABNORMAL LOW (ref 83–108)
pO2, Arterial: 64 mmHg — ABNORMAL LOW (ref 83–108)

## 2024-06-27 LAB — BASIC METABOLIC PANEL WITH GFR
Anion gap: 10 (ref 5–15)
Anion gap: 11 (ref 5–15)
BUN: 17 mg/dL (ref 8–23)
BUN: 22 mg/dL (ref 8–23)
CO2: 20 mmol/L — ABNORMAL LOW (ref 22–32)
CO2: 21 mmol/L — ABNORMAL LOW (ref 22–32)
Calcium: 8.4 mg/dL — ABNORMAL LOW (ref 8.9–10.3)
Calcium: 8 mg/dL — ABNORMAL LOW (ref 8.9–10.3)
Chloride: 107 mmol/L (ref 98–111)
Chloride: 106 mmol/L (ref 98–111)
Creatinine, Ser: 1.59 mg/dL — ABNORMAL HIGH (ref 0.61–1.24)
Creatinine, Ser: 2.02 mg/dL — ABNORMAL HIGH (ref 0.61–1.24)
GFR, Estimated: 45 mL/min — ABNORMAL LOW (ref >60)
GFR, Estimated: 34 mL/min — ABNORMAL LOW (ref >60)
Glucose, Bld: 123 mg/dL — ABNORMAL HIGH (ref 70–99)
Glucose, Bld: 130 mg/dL — ABNORMAL HIGH (ref 70–99)
Potassium: 5.5 mmol/L — ABNORMAL HIGH (ref 3.5–5.1)
Potassium: 4.7 mmol/L (ref 3.5–5.1)
Sodium: 137 mmol/L (ref 135–145)
Sodium: 138 mmol/L (ref 135–145)

## 2024-06-27 LAB — TYPE AND SCREEN
ABO/RH(D): AB POS
Antibody Screen: NEGATIVE
Unit division: 0
Unit division: 0
Unit division: 0
Unit division: 0

## 2024-06-27 LAB — CBC
HCT: 28.6 % — ABNORMAL LOW (ref 39.0–52.0)
HCT: 25.1 % — ABNORMAL LOW (ref 39.0–52.0)
Hemoglobin: 9.7 g/dL — ABNORMAL LOW (ref 13.0–17.0)
Hemoglobin: 8.3 g/dL — ABNORMAL LOW (ref 13.0–17.0)
MCH: 30.2 pg (ref 26.0–34.0)
MCH: 29.6 pg (ref 26.0–34.0)
MCHC: 33.9 g/dL (ref 30.0–36.0)
MCHC: 33.1 g/dL (ref 30.0–36.0)
MCV: 89.1 fL (ref 80.0–100.0)
MCV: 89.6 fL (ref 80.0–100.0)
Platelets: 175 K/uL (ref 150–400)
Platelets: 141 K/uL — ABNORMAL LOW (ref 150–400)
RBC: 3.21 MIL/uL — ABNORMAL LOW (ref 4.22–5.81)
RBC: 2.8 MIL/uL — ABNORMAL LOW (ref 4.22–5.81)
RDW: 15.7 % — ABNORMAL HIGH (ref 11.5–15.5)
RDW: 15.9 % — ABNORMAL HIGH (ref 11.5–15.5)
WBC: 17.6 K/uL — ABNORMAL HIGH (ref 4.0–10.5)
WBC: 17.3 K/uL — ABNORMAL HIGH (ref 4.0–10.5)
nRBC: 0 % (ref 0.0–0.2)
nRBC: 0 % (ref 0.0–0.2)

## 2024-06-27 LAB — BPAM FFP
Blood Product Expiration Date: 202507142359
Blood Product Expiration Date: 202507142359
ISSUE DATE / TIME: 202507091701
ISSUE DATE / TIME: 202507091701
Unit Type and Rh: 2800
Unit Type and Rh: 2800

## 2024-06-27 LAB — BPAM CRYOPRECIPITATE
Blood Product Expiration Date: 202507132359
Blood Product Expiration Date: 202507142359
ISSUE DATE / TIME: 202507091624
ISSUE DATE / TIME: 202507091703
Unit Type and Rh: 6200
Unit Type and Rh: 6200

## 2024-06-27 LAB — BPAM RBC
Blood Product Expiration Date: 202508092359
Blood Product Expiration Date: 202508082359
Blood Product Unit Number: 202508082359
Blood Product Unit Number: 202508092359
ISSUE DATE / TIME: 202507090836
ISSUE DATE / TIME: 202507090836
PRODUCT CODE: 202507090836
PRODUCT CODE: 202508092359
PRODUCT CODE: 202508092359
Unit Type and Rh: 6200
Unit Type and Rh: 202507090836
Unit Type and Rh: 202508082359
Unit Type and Rh: 6200
Unit Type and Rh: 6200
Unit Type and Rh: 6200
Unit Type and Rh: 6200

## 2024-06-27 LAB — PREPARE CRYOPRECIPITATE
Unit division: 0
Unit division: 0

## 2024-06-27 LAB — PREPARE FRESH FROZEN PLASMA
Unit division: 0
Unit division: 0

## 2024-06-27 LAB — MAGNESIUM
Magnesium: 4.1 mg/dL — ABNORMAL HIGH (ref 1.7–2.4)
Magnesium: 3.6 mg/dL — ABNORMAL HIGH (ref 1.7–2.4)

## 2024-06-27 LAB — PREPARE PLATELET PHERESIS
Unit division: 0
Unit division: 0

## 2024-06-27 LAB — BPAM PLATELET PHERESIS
Blood Product Expiration Date: 202507112359
Blood Product Expiration Date: 202507112359
ISSUE DATE / TIME: 202507091623
ISSUE DATE / TIME: 202507091623
Unit Type and Rh: 6200
Unit Type and Rh: 6200

## 2024-06-27 LAB — GLUCOSE, CAPILLARY
Glucose-Capillary: 108 mg/dL — ABNORMAL HIGH (ref 70–99)
Glucose-Capillary: 109 mg/dL — ABNORMAL HIGH (ref 70–99)
Glucose-Capillary: 118 mg/dL — ABNORMAL HIGH (ref 70–99)
Glucose-Capillary: 118 mg/dL — ABNORMAL HIGH (ref 70–99)
Glucose-Capillary: 123 mg/dL — ABNORMAL HIGH (ref 70–99)
Glucose-Capillary: 125 mg/dL — ABNORMAL HIGH (ref 70–99)
Glucose-Capillary: 150 mg/dL — ABNORMAL HIGH (ref 70–99)
Glucose-Capillary: 77 mg/dL (ref 70–99)
Glucose-Capillary: 97 mg/dL (ref 70–99)

## 2024-06-27 LAB — VANCOMYCIN, TROUGH: Vancomycin Tr: 38 ug/mL (ref 15–20)

## 2024-06-27 MED ORDER — CEFAZOLIN SODIUM-DEXTROSE 2-4 GM/100ML-% IV SOLN
2.0000 g | Freq: Two times a day (BID) | INTRAVENOUS | Status: AC
  Administered 2024-06-28 (×2): 2 g via INTRAVENOUS
  Filled 2024-06-27 (×2): qty 100

## 2024-06-27 MED ORDER — VANCOMYCIN VARIABLE DOSE PER UNSTABLE RENAL FUNCTION (PHARMACIST DOSING): Status: DC

## 2024-06-27 MED ORDER — INSULIN ASPART 100 UNIT/ML IJ SOLN
0.0000 [IU] | INTRAMUSCULAR | Status: DC
  Administered 2024-06-27 – 2024-06-28 (×7): 2 [IU] via SUBCUTANEOUS

## 2024-06-27 MED ORDER — SODIUM BICARBONATE 8.4 % IV SOLN
50.0000 meq | Freq: Once | INTRAVENOUS | Status: AC
  Administered 2024-06-27: 50 meq via INTRAVENOUS

## 2024-06-27 MED ORDER — MIDODRINE HCL 5 MG PO TABS
10.0000 mg | ORAL_TABLET | Freq: Three times a day (TID) | ORAL | Status: DC
  Administered 2024-06-27 – 2024-06-30 (×9): 10 mg via ORAL
  Filled 2024-06-27 (×9): qty 2

## 2024-06-27 MED FILL — Potassium Chloride Inj 2 mEq/ML: INTRAVENOUS | Qty: 40 | Status: AC

## 2024-06-27 MED FILL — Lidocaine HCl Local Preservative Free (PF) Inj 2%: INTRAMUSCULAR | Qty: 14 | Status: AC

## 2024-06-27 MED FILL — Heparin Sodium (Porcine) Inj 1000 Unit/ML: Qty: 1000 | Status: AC

## 2024-06-27 NOTE — Consult Note (Addendum)
 Advanced Heart Failure Team Consult Note   Primary Physician: Verena Mems, MD Cardiologist:  Arun K Thukkani, MD  Reason for Consultation: Post-op management   HPI:    Kyle Matthews is seen today for post-op management at the request of Dr. Lucas with TCTS. 78 y.o. male with history of severe AS s/p AVR with 23 mm Edwards Inspiris Resilia pericardial valve in 11/24, CAD s/p CABG (LIMA to LAD, SVG to Ramus) at time of AVR, post-op AF, HTN, HLD, hx CVA, COPD, tobacco use, anemia, hx BPH s/p robot-assisted prostatectomy in 06/25  Presented to [facility] 06/25 with chest pain, lower extremity edema and rigors. Blood cultures grew methicillin resistant staph epidermidis. He was started on antibiotics.  Had MRI brain as part of initial workup and found to have acute left occipital lobe infarct. TTE with increased gradients across aortic valve prosthesis. He was transferred to [facility] for TEE to better assess for bioprosthetic valve endocarditis.  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  He underwent redo sternotomy, Bentall procedure, CABG X1 (SVG to RCA) and tricuspid valve replacement w/ 27 mm Medtronic Mosaic porcine valve. CPB time was 386 minutes. He was extubated this morning. Off all pressors with the exception of low-dose neo. Norva removed this am as it was in the right atrium. Has AKI from prolonged CPB.     Home Medications Prior to Admission medications   Medication Sig Start Date End Date Taking? Authorizing Provider  amLODipine  (NORVASC ) 5 MG tablet Take 1 tablet (5 mg total) by mouth daily. 10/13/23  Yes Amin, Ankit C, MD  aspirin  81 MG chewable tablet Chew 81 mg by mouth daily. Swallow whole.   Yes [provider]  ciprofloxacin  (CIPRO ) 500 MG tablet Take 500 mg by mouth 2 (two) times daily.   Yes [provider]  docusate sodium  (COLACE) 100 MG capsule Take 1 capsule (100 mg total) by mouth 2 (two) times  daily. Patient taking differently: Take 100 mg by mouth daily as needed for mild constipation. 05/22/24  Yes Dancy, Alan, PA-C  Ferrous Gluconate (IRON  27 PO) Take 27 mg by mouth daily.   Yes [provider]  finasteride  (PROSCAR ) 5 MG tablet Take 5 mg by mouth every evening.   Yes [provider]  furosemide  (LASIX ) 20 MG tablet TAKE 1 TABLET(20 MG) BY MOUTH DAILY AS NEEDED Patient taking differently: Take 20 mg by mouth daily as needed for fluid or edema. 03/01/24  Yes Thukkani, Arun K, MD  nitroGLYCERIN  (NITROSTAT ) 0.4 MG SL tablet Place 1 tablet (0.4 mg total) under the tongue every 5 (five) minutes as needed for chest pain. 10/19/23  Yes Thukkani, Arun K, MD  rosuvastatin  (CRESTOR ) 40 MG tablet Take 40 mg by mouth daily.   Yes [provider]    Past Medical History: Past Medical History:  Diagnosis Date   Anemia    Anxiety    Arthritis    BPH (benign prostatic hyperplasia)    Status post biopsy in 2009. - w/ LUTS   Coronary artery disease    a. s/p CABG in 10/2023 with LIMA-LAD and SVG-Ramus at the time of AVR   Depression    Essential hypertension    Hyperlipidemia    On Crestor  - followed by PCP   Moderate calcific aortic stenosis 04/2017   a. s/p AVR with 23 mm Edwards Inspiris Resilia pericardial valve in 10/2023   Stroke [facility]) 2007   No true etiology  noted   UTI (urinary tract infection)     Past Surgical History: Past Surgical History:  Procedure Laterality Date   AORTIC VALVE REPLACEMENT N/A 11/02/2023   Procedure: AORTIC VALVE REPLACEMENT (AVR) USING 23 MM INSPIRIS RESILIA AORTIC VALVE;  Surgeon: Lucas Dorise POUR, MD;  Location: [facility] OR;  Service: Open Heart Surgery;  Laterality: N/A;   BENTALL PROCEDURE N/A 06/26/2024   Procedure: BENTALL PROCEDURE USING KONECT AORTIC VALVE CONDUIT SIZE ;  Surgeon: Lucas Dorise POUR, MD;  Location: [facility] OR;  Service: Open Heart Surgery;  Laterality: N/A;   CORONARY ARTERY BYPASS GRAFT N/A 11/02/2023    Procedure: CORONARY ARTERY BYPASS GRAFTING (CABG) X TWO USING LEFT INTERNAL MAMMARY ARTERY AND RIGHT GREATER SAPHENEOUS VEIN HARVESTED ENDOSCOPICLY;  Surgeon: Lucas Dorise POUR, MD;  Location: [facility] OR;  Service: Open Heart Surgery;  Laterality: N/A;   CORONARY ARTERY BYPASS GRAFT N/A 06/26/2024   Procedure: CORONARY ARTERY BYPASS GRAFTING (CABG) TIMES ONE USING ENDOSCOPICALLY HARVESTED LEFT GREATER SAPHENOUS VEIN;  Surgeon: Lucas Dorise POUR, MD;  Location: [facility] OR;  Service: Open Heart Surgery;  Laterality: N/A;   CORONARY PRESSURE/FFR STUDY N/A 10/09/2023   Procedure: CORONARY PRESSURE/FFR STUDY;  Surgeon: Wendel Lurena POUR, MD;  Location: [facility] INVASIVE [facility] LAB;  Service: Cardiovascular;  Laterality: N/A;   IR ANGIOGRAM PELVIS SELECTIVE OR SUPRASELECTIVE  10/27/2023   IR ANGIOGRAM PELVIS SELECTIVE OR SUPRASELECTIVE  10/27/2023   IR ANGIOGRAM SELECTIVE EACH ADDITIONAL VESSEL  10/27/2023   IR ANGIOGRAM SELECTIVE EACH ADDITIONAL VESSEL  10/27/2023   IR ANGIOGRAM SELECTIVE EACH ADDITIONAL VESSEL  10/27/2023   IR ANGIOGRAM SELECTIVE EACH ADDITIONAL VESSEL  10/27/2023   IR EMBO ARTERIAL NOT HEMORR HEMANG INC GUIDE ROADMAPPING  10/27/2023   IR US  GUIDE VASC ACCESS RIGHT  10/27/2023   REDO STERNOTOMY N/A 06/26/2024   Procedure: REDO STERNOTOMY;  Surgeon: Lucas Dorise POUR, MD;  Location: [facility];  Service: Open Heart Surgery;  Laterality: N/A;   RIGHT HEART CATH AND CORONARY ANGIOGRAPHY N/A 06/20/2024   Procedure: RIGHT HEART CATH AND CORONARY ANGIOGRAPHY;  Surgeon: Ladona Heinz, MD;  [facility];  Service: Cardiovascular;  Laterality: N/A;   RIGHT/LEFT HEART CATH AND CORONARY ANGIOGRAPHY N/A 10/09/2023   Procedure: RIGHT/LEFT HEART CATH AND CORONARY ANGIOGRAPHY;  Surgeon: Wendel Lurena POUR, MD;  [facility];  Service: Cardiovascular;  Laterality: N/A;   TEE WITHOUT CARDIOVERSION N/A 11/02/2023   Procedure: TRANSESOPHAGEAL ECHOCARDIOGRAM;  Surgeon: Lucas Dorise POUR, MD;  Location: [facility] OR;  Service: Open  Heart Surgery;  Laterality: N/A;   TEE WITHOUT CARDIOVERSION N/A 06/26/2024   Procedure: ECHOCARDIOGRAM, TRANSESOPHAGEAL;  Surgeon: Lucas Dorise POUR, MD;  Location[facility];  Service: Open Heart Surgery;  Laterality: N/A;   TRANSESOPHAGEAL ECHOCARDIOGRAM (CATH LAB) N/A 06/18/2024   Procedure: TRANSESOPHAGEAL ECHOCARDIOGRAM;  Surgeon: Jeffrie Oneil BROCKS, MD;  Location: [facility];  Service: Cardiovascular;  Laterality: N/A;   TRANSTHORACIC ECHOCARDIOGRAM  04/28/2017    EF 65-70%. GR 1 DD. No regional wall motion abnormality. Moderate aortic stenosis with moderate regurgitation. Mean gradient 34 mmHg.   TRICUSPID VALVE REPLACEMENT N/A 06/26/2024   Procedure: TRICUSPID VALVE REPLACEMENT USING MOSIAC BRIOPROSTHESIS VALVE SIZE ;  Surgeon: Lucas Dorise POUR, MD;  Location: [facility] OR;  Service: Open Heart Surgery;  Laterality: N/A;   XI ROBOTIC ASSISTED SIMPLE PROSTATECTOMY N/A 05/22/2024   Procedure: PROSTATECTOMY, SIMPLE, ROBOT-ASSISTED;  Surgeon: Alvaro Ricardo KATHEE Mickey., MD;  Location: [facility] ORS;  Service: Urology;  Laterality: N/A;    Family History: Family  History  Problem Relation Age of Onset   Coronary artery disease Mother 75       64. 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 Lives with his wife of 49 years. He is a retired Marine scientist used to work for [employer]   4 cups of coffee a day.    No structured exercise regimen   Social Drivers of Health   Financial Resource Strain: Not on file  Food Insecurity: No Food Insecurity (06/12/2024)   Hunger Vital Sign    Worried About Running Out of Food in the Last Year: Never true    Ran Out of Food in the Last Year: Never true  Recent Concern: Food Insecurity - Food Insecurity Present (05/22/2024)   Hunger Vital Sign    Worried About Running Out of Food in the Last Year: Sometimes true    Ran Out of Food in the Last Year: Sometimes true  Transportation Needs: No Transportation Needs (06/12/2024)   PRAPARE - Administrator, Civil Service (Medical): No    Lack of Transportation (Non-Medical): No  Physical Activity: Not on file  Stress: Not on file  Social Connections: Moderately Integrated (06/12/2024)   Social Connection and Isolation Panel    Frequency of Communication with Friends and Family: More than three times a week    Frequency of Social Gatherings with Friends and Family: Never    Attends Religious Services: More than 4 times per year    Active Member of [employer] or Organizations: No    Attends Banker Meetings: Never    Marital Status: Married    Allergies:  Allergies  Allergen Reactions   Tetanus Toxoids Other (See Comments)    Childhood Allergy    Zetia  [Ezetimibe ] Diarrhea    Objective:    Vital Signs:   Temp:  [91.4 F (33 C)-99.3 F (37.4 C)] 99 F (37.2  C) (07/10 0715) Pulse Rate:  [79-90] 89 (07/10 1045) Resp:  [12-22] 19 (07/10 1045) BP: (64-94)/(54-61) 64/54 (07/09 1949) SpO2:  [90 %-100 %] 96 % (07/10 1045) Arterial Line BP: (68-121)/(46-73) 103/57 (  07/10 1045) FiO2 (%):  [40 %-50 %] 40 % (07/10 0831) Weight:  [76.3 kg] 76.3 kg (07/10 0457) Last BM Date : 06/25/24  Weight change: Filed Weights   06/20/24 0616 06/26/24 0657 06/27/24 0457  Weight: 69 kg (P) 70.3 kg 76.3 kg    Intake/Output:   Intake/Output Summary (Last 24 hours) at 06/27/2024 1055 Last data filed at 06/27/2024 1000 Gross per 24 hour  Intake 6849.51 ml  Output 2777 ml  Net 4072.51 ml      Physical Exam    General:  Sitting up in chair. No distress. Neck: JVP 7-8, R internal jugular introducer. Cor: Regular rate & rhythm. No rubs, gallops or murmurs. Sternum stable. + mediastinal and pleural chest tubes. Lungs: clear Abdomen: soft, nontender, nondistended.  Extremities: 1-2+ edema Neuro: alert & orientedx3. Affect pleasant   Telemetry   AV paced 80  Labs   Basic Metabolic Panel: Recent Labs  Lab 06/24/24 0245 06/26/24 0415 06/26/24 0845 06/26/24 1403 06/26/24 1432 06/26/24 1509 06/26/24 1530 06/26/24 1558 06/26/24 1706 06/26/24 1712 06/26/24 1756 06/26/24 1949 06/27/24 0427 06/27/24 0843 06/27/24 1017  NA 136 134*   < > 133*   < > 133*   < > 133*   < > 134* 136 137 137 138 141  K 3.1* 4.1   < > 4.1   < > 4.2   < > 4.5   < > 4.8 4.1 4.5 5.5* 5.0 4.5  CL 104 105   < > 100  --  99  --  100  --  101  --   --  107  --   --   CO2 23 23  --   --   --   --   --   --   --   --   --   --  20*  --   --   GLUCOSE 100* 95   < > 137*  --  131*  --  151*  --  126*  --   --  123*  --   --   BUN 12 12   < > 13  --  14  --  13  --  18  --   --  17  --   --   CREATININE 0.88 1.00   < > 1.00  --  1.00  --  0.90  --  1.10  --   --  1.59*  --   --   CALCIUM  8.2* 8.2*  --   --   --   --   --   --   --   --   --   --  8.4*  --   --   MG 2.0  --   --    --   --   --   --   --   --   --   --   --  4.1*  --   --    < > = values in this interval not displayed.    Liver Function Tests: No results for input(s): AST, ALT, ALKPHOS, BILITOT, PROT, ALBUMIN  in the last 168 hours. No results for input(s): LIPASE, AMYLASE in the last 168 hours. No results for input(s): AMMONIA in the last 168 hours.  CBC: Recent Labs  Lab 06/24/24 0245 06/26/24 0415 06/26/24 0845 06/26/24 1553 06/26/24 1558 06/26/24 1623 06/26/24 1706 06/26/24 1949 06/26/24 1951 06/27/24 0427 06/27/24 0843 06/27/24 1017  WBC 11.7* 10.8*  --   --   --   --   --   --  15.7* 17.6*  --   --   HGB 9.4* 10.3*   < > 8.0*   < > 8.0*   < > 9.2* 9.5* 9.7* 8.2* 7.5*  HCT 28.3* 31.2*   < > 24.0*   < > 23.7*   < > 27.0* 28.2* 28.6* 24.0* 22.0*  MCV 88.7 90.2  --   --   --   --   --   --  88.7 89.1  --   --   PLT 203 262  --  124*  --  119*  --   --  145* 175  --   --    < > = values in this interval not displayed.    Cardiac Enzymes: No results for input(s): CKTOTAL, CKMB, CKMBINDEX, TROPONINI in the last 168 hours.  BNP: BNP (last 3 results) Recent Labs    10/03/23 0412 10/04/23 0509 06/12/24 1819  BNP 547.0* 501.3* 169.0*    ProBNP (last 3 results) No results for input(s): PROBNP in the last 8760 hours.   CBG: Recent Labs  Lab 06/27/24 0031 06/27/24 0131 06/27/24 0237 06/27/24 0428 06/27/24 0712  GLUCAP 118* 108* 109* 118* 77    Coagulation Studies: Recent Labs    06/26/24 1951  LABPROT 12.1  INR 0.9     Imaging   DG Chest Port 1 View Result Date: 06/27/2024 CLINICAL DATA:  Heard valve replacement EXAM: PORTABLE CHEST 1 VIEW COMPARISON:  06/26/2024 FINDINGS: Endotracheal tube, NG 2 mediastinal drain are unchanged. Central venous line with tip the RIGHT atrium. Bilateral chest tubes. Again demonstrated probable small LEFT apical pneumothorax thickened pleura. Some improvement aeration lung bases. No pulmonary edema.  IMPRESSION: 1. Stable support apparatus. 2. Central venous line in RIGHT atrium. 3. Stable small LEFT apical pneumothorax. 4. Improved aeration lung bases. Electronically Signed   By: Jackquline Boxer M.D.   On: 06/27/2024 08:32   DG Chest Port 1 View Result Date: 06/26/2024 CLINICAL DATA:  Status post aortic valve replacement EXAM: PORTABLE CHEST 1 VIEW COMPARISON:  06/12/2024 FINDINGS: Interval intubation, tip of the endotracheal tube is about 4.2 cm superior to the carina. Enteric tube tip and side port at the gastric fundus. Right IJ central venous catheter with tip at the low right atrium. Sternotomy and aortic valve prosthesis. Interim placement of mediastinal and bilateral chest drainage catheters. Mild cardiomegaly. Small bilateral pleural effusions. Bandlike areas of atelectasis in the mid and lower lungs. Possible small left apical pneumothorax. IMPRESSION: 1. Interval intubation, placement of mediastinal and bilateral chest drainage catheters, support lines and tubes as above. Possible small left apical pneumothorax. 2. Mild cardiomegaly with small bilateral pleural effusions with bandlike areas of atelectasis in the mid and lower lungs. Electronically Signed   By: Luke Bun M.D.   On: 06/26/2024 20:04     Medications:     Current Medications:  acetaminophen   1,000 mg Oral Q6H   Or   acetaminophen  (TYLENOL ) oral liquid 160 mg/5 mL  1,000 mg Per Tube Q6H   aspirin  EC  325 mg Oral Daily   Or   aspirin   324 mg Per Tube Daily   bisacodyl   10 mg Oral Daily   Or   bisacodyl   10 mg Rectal Daily   Chlorhexidine  Gluconate Cloth  6 each Topical Daily   docusate sodium   200 mg Oral Daily   finasteride   5 mg Oral QPM   insulin  aspart  0-24 Units Subcutaneous Q4H   metoCLOPramide  (REGLAN ) injection  10 mg Intravenous  Q6H   [START ON 06/28/2024] pantoprazole   40 mg Oral Daily   pantoprazole  (PROTONIX ) IV  40 mg Intravenous QHS   rosuvastatin   40 mg Oral Daily   sodium bicarbonate   50 mEq  Intravenous Once   sodium chloride  flush  3 mL Intravenous Q12H   vancomycin  variable dose per unstable renal function (pharmacist dosing)   Does not apply See admin instructions    Infusions:  sodium chloride      sodium chloride  Stopped (06/27/24 0558)   sodium chloride  10 mL/hr at 06/27/24 1000    ceFAZolin  (ANCEF ) IV Stopped (06/27/24 0536)   dexmedetomidine  (PRECEDEX ) IV infusion Stopped (06/27/24 9177)   lactated ringers  20 mL/hr at 06/27/24 1000   lactated ringers  20 mL/hr at 06/26/24 2024   nitroGLYCERIN  Stopped (06/26/24 2027)   phenylephrine  (NEO-SYNEPHRINE) Adult infusion 20 mcg/min (06/27/24 1000)      Patient Profile   78 y.o. male with history of severe AS s/p AVR in 11/24, CAD s/p CABG X 2 in 11/24, BPH s/p robot-assisted prostatectomy. 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 Prolonged pump time. EF preserved on intra-op TEE. -Doing well post-op. Off all pressors except low-dose neo.  Jacobo out this am.  -Will need diuresis soon, holding off for now per TCTS.  Place TED hose -AV pacing. Some concern he may require PPM given location of periannular abscess  2. CAD -S/p CABG X 2 (LIMA to LAD, SVG to Ramus) 11/24 -S/p CABG X 1 (SVG to RCA) 07/09 -Continue aspirin  + statin  3. Acute CVA -Small left occipital infarct on MRI -Suspect d/t septic emboli  4. AKI -suspect 2/2 prolonged CPB -Scr 1>1.6 -Monitor  5. Hyperkalemia -K 5.5 -Labs this afternoon   Length of Stay: 12  FINCH, LINDSAY N, PA-C  06/27/2024, 10:55 AM    Advanced Heart Failure Team Pager [phone] (M-F; 7a - 5p)  Please contact [facility] for night-coverage after  hours (4p -7a ) and weekends on [email or web address]   Patient seen and examined with the above-signed Advanced Practice Provider and/or Housestaff. I personally reviewed laboratory data, imaging studies and relevant notes. I independently examined the patient and formulated the important aspects of the plan. I have edited the note to reflect any of my changes or salient points. I have personally discussed the plan with the patient and/or family.  78 y/o male with CAD s/p CABG, AVR and TV endocarditis. Underwent AVR/Bental and TVR + SVG to RCA yesterday with prolonged pump run.   Extubated this am. Norva out. Remains on low-dose neo.   Remains paced.  On vanc.   Feels ok. Weak.   General:  Sitting up in chair  No resp difficulty HEENT: normal Neck: supple.JVP to jaw . Carotids 2+ bilat; no bruits. No lymphadenopathy or thryomegaly appreciated. Cor: sternal wound dressed + EPW  RRR Lungs: decreased at bases  Abdomen: soft, nontender, nondistended. hypoactive d bowel sounds. Extremities: no cyanosis, clubbing, rash, tr edema Neuro: alert & orientedx3, cranial nerves grossly intact. moves all 4 extremities w/o difficulty. Affect pleasant  Doing remarkably well POD #1. Will need diuresis. Follow co-ox may need a little inotrope support as distal extremities are a bit cool.   Follow rhythm may need PPM.   Continue IV abx.  Ambulate.  Toribio Fuel, MD  12:35 AM

## 2024-06-27 NOTE — Progress Notes (Signed)
 1 Day Post-Op Procedure(s) (LRB): TRICUSPID VALVE REPLACEMENT USING MOSIAC BRIOPROSTHESIS VALVE SIZE (N/A) ECHOCARDIOGRAM, TRANSESOPHAGEAL (N/A) CORONARY ARTERY BYPASS GRAFTING (CABG) TIMES ONE USING ENDOSCOPICALLY HARVESTED LEFT GREATER SAPHENOUS VEIN (N/A) BENTALL PROCEDURE USING KONECT AORTIC VALVE CONDUIT SIZE (N/A) REDO STERNOTOMY (N/A) Subjective:  Stable night. Kept on vent overnight but woke up ok and still on Precedex .  AV paced 80.  CVP 8  Objective: Vital signs in last 24 hours: Temp:  [91.4 F (33 C)-99.3 F (37.4 C)] 98.8 F (37.1 C) (07/10 0700) Pulse Rate:  [65-80] 80 (07/10 0700) Cardiac Rhythm: A-V Sequential paced (07/10 0400) Resp:  [10-22] 14 (07/10 0700) BP: (64-135)/(54-68) 64/54 (07/09 1949) SpO2:  [96 %-100 %] 100 % (07/10 0700) Arterial Line BP: (68-121)/(47-71) 107/63 (07/10 0700) FiO2 (%):  [50 %] 50 % (07/10 0400) Weight:  [76.3 kg] 76.3 kg (07/10 0457)  Hemodynamic parameters for last 24 hours: PAP: (5-30)/(-7-14) 16/13  Intake/Output from previous day: 07/09 0701 - 07/10 0700 In: 7292.7 [I.V.:3285.1; Blood:2331; NG/GT:180; IV Piggyback:1496.7] Out: 2577 [Urine:1007; Emesis/NG output:230; Blood:850; Chest Tube:490] Intake/Output this shift: No intake/output data recorded.  General appearance: on vent. Follows commands Neurologic: intact Heart: regular rate and rhythm, S1, S2 normal, no murmur Lungs: clear to auscultation bilaterally Abdomen: soft, non-tender; no bowel sounds  Extremities: edema moderate Wound: dressing dry  Lab Results: Recent Labs    06/26/24 1951 06/27/24 0427  WBC 15.7* 17.6*  HGB 9.5* 9.7*  HCT 28.2* 28.6*  PLT 145* 175   BMET:  Recent Labs    06/26/24 0415 06/26/24 0845 06/26/24 1712 06/26/24 1756 06/26/24 1949 06/27/24 0427  NA 134*   < > 134*   < > 137 137  K 4.1   < > 4.8   < > 4.5 5.5*  CL 105   < > 101  --   --  107  CO2 23  --   --   --   --  20*  GLUCOSE 95   < > 126*  --   --   123*  BUN 12   < > 18  --   --  17  CREATININE 1.00   < > 1.10  --   --  1.59*  CALCIUM  8.2*  --   --   --   --  8.4*   < > = values in this interval not displayed.    PT/INR:  Recent Labs    06/26/24 1951  LABPROT 12.1  INR 0.9   ABG    Component Value Date/Time   PHART 7.341 (L) 06/26/2024 1949   HCO3 24.8 06/26/2024 1949   TCO2 26 06/26/2024 1949   ACIDBASEDEF 1.0 06/26/2024 1949   O2SAT 97 06/26/2024 1949   CBG (last 3)  Recent Labs    06/27/24 0131 06/27/24 0237 06/27/24 0428  GLUCAP 108* 109* 118*   CXR: ok  Assessment/Plan: S/P Procedure(s) (LRB): TRICUSPID VALVE REPLACEMENT USING MOSIAC BRIOPROSTHESIS VALVE SIZE (N/A) ECHOCARDIOGRAM, TRANSESOPHAGEAL (N/A) CORONARY ARTERY BYPASS GRAFTING (CABG) TIMES ONE USING ENDOSCOPICALLY HARVESTED LEFT GREATER SAPHENOUS VEIN (N/A) BENTALL PROCEDURE USING KONECT AORTIC VALVE CONDUIT SIZE (N/A) REDO STERNOTOMY (N/A)  POD 1 Hemodynamics stable on low dose neo.  Rhythm is AV paced 80. He will not atrial pace. Preop ECG showed RBBB and LAFB and where his periannular abscess was I am sure he will have CHB and need PPM at some point.  DC Precedex  and wean vent to extubate.  AKI related to long pump run.  Preop creat normal so I expect to resolve over the next couple days. Hold off on diuretic since still on neo.  Keep chest tubes in.  DC swan since it is in right atrium.   Continue vancomycin  per ID. Follow up on operative cultures.   LOS: 12 days    Kyle Matthews 06/27/2024

## 2024-06-27 NOTE — Progress Notes (Signed)
 Pharmacy Antibiotic Note  Kyle Matthews is a 78 y.o. male admitted on 06/12/2024 with MRSE bacteremia. Pt noted to have vegetation on prosthetic aortic valve and tricuspid valve, and also possible CNS emboli.  Pharmacy has been consulted for vancomycin  dosing. Patient is now s/p TVR, CABG and Bentall procedure on 7/9. Noted that patient had an acute AKI post surgery with SCr up to 1.59 from 1.10 yesterday.   Vanc trough collected ~ 13 hours post last dose shows a level of 38. Calculated ke= 0.0314 with half-life of 22 hours.   Predicted that level will fall to 15 tomorrow around 2 PM.   Plan: Hold Vancomycin  Plan re-check level tomorrow at noon Monitor renal function,  OR cultures   Height: (P) 5' 2 (157.5 cm) Weight: 76.3 kg (168 lb 3.4 oz) IBW/kg (Calculated) : (P) 54.6  Temp (24hrs), Avg:97.7 F (36.5 C), Min:91.4 F (33 C), Max:99.3 F (37.4 C)  Recent Labs  Lab 06/24/24 0245 06/25/24 1013 06/26/24 0415 06/26/24 0845 06/26/24 1403 06/26/24 1509 06/26/24 1558 06/26/24 1712 06/26/24 1951 06/27/24 0427 06/27/24 0939  WBC 11.7*  --  10.8*  --   --   --   --   --  15.7* 17.6*  --   CREATININE 0.88  --  1.00   < > 1.00 1.00 0.90 1.10  --  1.59*  --   VANCOTROUGH  --  26*  --   --   --   --   --   --   --   --  38*   < > = values in this interval not displayed.    Estimated Creatinine Clearance: 35.9 mL/min (A) (by C-G formula based on SCr of 1.59 mg/dL (H)).    Allergies  Allergen Reactions   Tetanus Toxoids Other (See Comments)    Childhood Allergy    Zetia  [Ezetimibe ] Diarrhea      Damien Quiet, PharmD, BCPS, BCIDP Infectious Diseases Clinical Pharmacist Phone: [phone] 06/27/2024 11:13 AM **Pharmacist phone directory can now be found on [email or web address] (PW [email or web address]).  Listed under [facility].

## 2024-06-27 NOTE — Progress Notes (Signed)
 Subjective:  Patient requested that he be reclined to further in bed by staff   Antibiotics:  Anti-infectives (From admission, onward)    Start     Dose/Rate Route Frequency Ordered Stop   06/27/24 0818  vancomycin  variable dose per unstable renal function (pharmacist dosing)         Does not apply See admin instructions 06/27/24 0818     06/26/24 2215  vancomycin  (VANCOCIN ) IVPB 1000 mg/200 mL premix  Status:  Discontinued        1,000 mg 200 mL/hr over 60 Minutes Intravenous  Once 06/26/24 1915 06/26/24 1939   06/26/24 2200  ceFAZolin  (ANCEF ) IVPB 2g/100 mL premix        2 g 200 mL/hr over 30 Minutes Intravenous Every 8 hours 06/26/24 1915 06/28/24 2159   06/26/24 2100  vancomycin  (VANCOCIN ) IVPB 1000 mg/200 mL premix  Status:  Discontinued        1,000 mg 200 mL/hr over 60 Minutes Intravenous Every 12 hours 06/26/24 1936 06/27/24 0754   06/26/24 0400  vancomycin  (VANCOCIN ) IVPB 1000 mg/200 mL premix        1,000 mg 200 mL/hr over 60 Minutes Intravenous To Surgery 06/25/24 1322 06/26/24 0846   06/26/24 0400  ceFAZolin  (ANCEF ) IVPB 2g/100 mL premix  Status:  Discontinued        2 g 200 mL/hr over 30 Minutes Intravenous To Surgery 06/25/24 1322 06/26/24 1909   06/26/24 0400  ceFAZolin  (ANCEF ) IVPB 2g/100 mL premix        2 g 200 mL/hr over 30 Minutes Intravenous To Surgery 06/25/24 1322 06/26/24 1712   06/25/24 2300  vancomycin  (VANCOCIN ) IVPB 1000 mg/200 mL premix  Status:  Discontinued        1,000 mg 200 mL/hr over 60 Minutes Intravenous Every 12 hours 06/25/24 1316 06/26/24 1909   06/22/24 2300  vancomycin  (VANCOREADY) IVPB 1250 mg/250 mL  Status:  Discontinued        1,250 mg 166.7 mL/hr over 90 Minutes Intravenous Every 12 hours 06/22/24 1141 06/25/24 1316   06/19/24 2200  vancomycin  (VANCOREADY) IVPB 1500 mg/300 mL  Status:  Discontinued        1,500 mg 150 mL/hr over 120 Minutes Intravenous Every 12 hours 06/19/24 1246 06/22/24 1141   06/18/24 0000   vancomycin  (VANCOCIN ) IVPB 1000 mg/200 mL premix  Status:  Discontinued        1,000 mg 200 mL/hr over 60 Minutes Intravenous Every 12 hours 06/17/24 1115 06/19/24 1246   06/15/24 1000  vancomycin  (VANCOREADY) IVPB 1250 mg/250 mL  Status:  Discontinued        1,250 mg 166.7 mL/hr over 90 Minutes Intravenous Every 24 hours 06/14/24 1029 06/17/24 1115   06/14/24 0845  vancomycin  (VANCOREADY) IVPB 1500 mg/300 mL        1,500 mg 150 mL/hr over 120 Minutes Intravenous  Once 06/14/24 0747 06/14/24 1133   06/13/24 0000  ciprofloxacin  (CIPRO ) tablet 500 mg  Status:  Discontinued        500 mg Oral 2 times daily 06/12/24 2311 06/13/24 0854       Medications: Scheduled Meds:  acetaminophen   1,000 mg Oral Q6H   Or   acetaminophen  (TYLENOL ) oral liquid 160 mg/5 mL  1,000 mg Per Tube Q6H   aspirin  EC  325 mg Oral Daily   Or   aspirin   324 mg Per Tube Daily   bisacodyl   10 mg Oral Daily   Or  bisacodyl   10 mg Rectal Daily   Chlorhexidine  Gluconate Cloth  6 each Topical Daily   docusate sodium   200 mg Oral Daily   finasteride   5 mg Oral QPM   insulin  aspart  0-24 Units Subcutaneous Q4H   metoCLOPramide  (REGLAN ) injection  10 mg Intravenous Q6H   midodrine   10 mg Oral Q8H   [START ON 06/28/2024] pantoprazole   40 mg Oral Daily   pantoprazole  (PROTONIX ) IV  40 mg Intravenous QHS   rosuvastatin   40 mg Oral Daily   sodium chloride  flush  3 mL Intravenous Q12H   vancomycin  variable dose per unstable renal function (pharmacist dosing)   Does not apply See admin instructions   Continuous Infusions:  sodium chloride      sodium chloride  Stopped (06/27/24 0558)   sodium chloride  10 mL/hr at 06/27/24 1300    ceFAZolin  (ANCEF ) IV 2 g (06/27/24 1300)   dexmedetomidine  (PRECEDEX ) IV infusion Stopped (06/27/24 9177)   lactated ringers  20 mL/hr at 06/27/24 1300   lactated ringers  20 mL/hr at 06/26/24 2024   nitroGLYCERIN  Stopped (06/26/24 2027)   phenylephrine  (NEO-SYNEPHRINE) Adult infusion 20  mcg/min (06/27/24 1300)   PRN Meds:.sodium chloride , morphine  injection, ondansetron  (ZOFRAN ) IV, oxyCODONE , sodium chloride  flush, traMADol     Objective: Weight change:   Intake/Output Summary (Last 24 hours) at 06/27/2024 1714 Last data filed at 06/27/2024 1611 Gross per 24 hour  Intake 6036.77 ml  Output 2692 ml  Net 3344.77 ml   Blood pressure (!) 82/66, pulse 89, temperature 99 F (37.2 C), resp. rate 19, height (P) 5' 2 (1.575 m), weight 76.3 kg, SpO2 93%. Temp:  [91.4 F (33 C)-99.3 F (37.4 C)] 99 F (37.2 C) (07/10 1500) Pulse Rate:  [79-90] 89 (07/10 1300) Resp:  [12-23] 19 (07/10 1300) BP: (64-94)/(54-73) 82/66 (07/10 0833) SpO2:  [90 %-100 %] 93 % (07/10 1300) Arterial Line BP: (68-121)/(46-73) 106/58 (07/10 1300) FiO2 (%):  [40 %-50 %] 40 % (07/10 0831) Weight:  [76.3 kg] 76.3 kg (07/10 0457)  Physical Exam: Physical Exam Cardiovascular:     Rate and Rhythm: Normal rate and regular rhythm.  Pulmonary:     Effort: No respiratory distress.     Breath sounds: No wheezing.  Abdominal:     General: There is no distension.  Neurological:     General: No focal deficit present.     Mental Status: He is alert and oriented to person, place, and time.  Psychiatric:        Mood and Affect: Mood normal.        Behavior: Behavior normal.        Thought Content: Thought content normal.        Judgment: Judgment normal.     Chest tubes in place CBC:    BMET Recent Labs    06/26/24 0415 06/26/24 0845 06/26/24 1712 06/26/24 1756 06/27/24 0427 06/27/24 0843 06/27/24 1017 06/27/24 1259  NA 134*   < > 134*   < > 137   < > 141 139  K 4.1   < > 4.8   < > 5.5*   < > 4.5 4.7  CL 105   < > 101  --  107  --   --   --   CO2 23  --   --   --  20*  --   --   --   GLUCOSE 95   < > 126*  --  123*  --   --   --  BUN 12   < > 18  --  17  --   --   --   CREATININE 1.00   < > 1.10  --  1.59*  --   --   --   CALCIUM  8.2*  --   --   --  8.4*  --   --   --    < > =  values in this interval not displayed.     Liver Panel  No results for input(s): PROT, ALBUMIN , AST, ALT, ALKPHOS, BILITOT, BILIDIR, IBILI in the last 72 hours.     Sedimentation Rate No results for input(s): ESRSEDRATE in the last 72 hours. C-Reactive Protein No results for input(s): CRP in the last 72 hours.  Micro Results: Recent Results (from the past 720 hours)  Culture, blood (Routine X 2) w Reflex to ID Panel     Status: Abnormal   Collection Time: 06/12/24 10:55 PM   Specimen: BLOOD  Result Value Ref Range Status   Specimen Description   Final    BLOOD BLOOD LEFT ARM Performed at [facility], [address] [place], [place] [address]    Special Requests   Final    BOTTLES DRAWN AEROBIC AND ANAEROBIC Blood Culture adequate volume Performed at [facility], [address] [place], [place] [address]    Culture  Setup Time   Final    GRAM POSITIVE COCCI IN BOTH AEROBIC AND ANAEROBIC BOTTLES CRITICAL VALUE NOTED.  VALUE IS CONSISTENT WITH PREVIOUSLY REPORTED AND CALLED VALUE. JESSICA ELLER AT 2315 06/13/24 BY A. SNYDER GRAM STAIN REVIEWED-AGREE WITH RESULT    Culture (A)  Final    STAPHYLOCOCCUS EPIDERMIDIS SUSCEPTIBILITIES PERFORMED ON PREVIOUS CULTURE WITHIN THE LAST 5 DAYS. Performed at [facility], [address] [place], [place] [address]    Report Status 06/17/2024 FINAL  Final  Culture, blood (Routine X 2) w Reflex to ID Panel     Status: Abnormal   Collection Time: 06/12/24 10:57 PM   Specimen: BLOOD LEFT HAND  Result Value Ref Range Status   Specimen Description   Final    BLOOD LEFT HAND Performed at [facility], [address] [place], [place] [address]    Special Requests   Final    BOTTLES DRAWN AEROBIC AND ANAEROBIC Blood Culture adequate volume Performed at [facility], [address] [place], [place] [address]    Culture  Setup Time   Final    GRAM POSITIVE COCCI IN BOTH AEROBIC AND ANAEROBIC  BOTTLES Gram Stain Report Called to,Read Back By and Verified With: JESSICA ELLER,RN AT 2315 06/13/24 BY A. SNYDER CRITICAL RESULT CALLED TO, READ BACK BY AND VERIFIED WITH: LOISE MORONES RN 06/14/2024 @ 0414 BY AB GRAM STAIN REVIEWED-AGREE WITH RESULT Performed at [facility], [address] [place], [place] [address]    Culture STAPHYLOCOCCUS EPIDERMIDIS (A)  Final   Report Status 06/17/2024 FINAL  Final   Organism ID, Bacteria STAPHYLOCOCCUS EPIDERMIDIS  Final      Susceptibility   Staphylococcus epidermidis - MIC*    CIPROFLOXACIN  >=8 RESISTANT Resistant     ERYTHROMYCIN >=8 RESISTANT Resistant     GENTAMICIN <=0.5 SENSITIVE Sensitive     OXACILLIN >=4 RESISTANT Resistant     TETRACYCLINE >=16 RESISTANT Resistant     VANCOMYCIN  <=0.5 SENSITIVE Sensitive     TRIMETH/SULFA 160 RESISTANT Resistant     CLINDAMYCIN >=8 RESISTANT Resistant     RIFAMPIN <=0.5 SENSITIVE Sensitive  Inducible Clindamycin NEGATIVE Sensitive     * STAPHYLOCOCCUS EPIDERMIDIS  Blood Culture ID Panel (Reflexed)     Status: Abnormal   Collection Time: 06/12/24 10:57 PM  Result Value Ref Range Status   Enterococcus faecalis NOT DETECTED NOT DETECTED Final   Enterococcus Faecium NOT DETECTED NOT DETECTED Final   Listeria monocytogenes NOT DETECTED NOT DETECTED Final   Staphylococcus species DETECTED (A) NOT DETECTED Final    Comment: CRITICAL RESULT CALLED TO, READ BACK BY AND VERIFIED WITH: N BAUGH RN 06/14/2024 @ 0414 BY AB    Staphylococcus aureus (BCID) NOT DETECTED NOT DETECTED Final   Staphylococcus epidermidis DETECTED (A) NOT DETECTED Final    Comment: Methicillin (oxacillin) resistant coagulase negative staphylococcus. Possible blood culture contaminant (unless isolated from more than one blood culture draw or clinical case suggests pathogenicity). No antibiotic treatment is indicated for blood  culture contaminants. CRITICAL RESULT CALLED TO, READ BACK BY AND VERIFIED WITH: N BAUGH RN 06/14/2024 @  0414 BY AB    Staphylococcus lugdunensis NOT DETECTED NOT DETECTED Final   Streptococcus species NOT DETECTED NOT DETECTED Final   Streptococcus agalactiae NOT DETECTED NOT DETECTED Final   Streptococcus pneumoniae NOT DETECTED NOT DETECTED Final   Streptococcus pyogenes NOT DETECTED NOT DETECTED Final   A.calcoaceticus-baumannii NOT DETECTED NOT DETECTED Final   Bacteroides fragilis NOT DETECTED NOT DETECTED Final   Enterobacterales NOT DETECTED NOT DETECTED Final   Enterobacter cloacae complex NOT DETECTED NOT DETECTED Final   Escherichia coli NOT DETECTED NOT DETECTED Final   Klebsiella aerogenes NOT DETECTED NOT DETECTED Final   Klebsiella oxytoca NOT DETECTED NOT DETECTED Final   Klebsiella pneumoniae NOT DETECTED NOT DETECTED Final   Proteus species NOT DETECTED NOT DETECTED Final   Salmonella species NOT DETECTED NOT DETECTED Final   Serratia marcescens NOT DETECTED NOT DETECTED Final   Haemophilus influenzae NOT DETECTED NOT DETECTED Final   Neisseria meningitidis NOT DETECTED NOT DETECTED Final   Pseudomonas aeruginosa NOT DETECTED NOT DETECTED Final   Stenotrophomonas maltophilia NOT DETECTED NOT DETECTED Final   Candida albicans NOT DETECTED NOT DETECTED Final   Candida auris NOT DETECTED NOT DETECTED Final   Candida glabrata NOT DETECTED NOT DETECTED Final   Candida krusei NOT DETECTED NOT DETECTED Final   Candida parapsilosis NOT DETECTED NOT DETECTED Final   Candida tropicalis NOT DETECTED NOT DETECTED Final   Cryptococcus neoformans/gattii NOT DETECTED NOT DETECTED Final   Methicillin resistance mecA/C DETECTED (A) NOT DETECTED Final    Comment: CRITICAL RESULT CALLED TO, READ BACK BY AND VERIFIED WITHBETHA LOISE MORONES RN 06/14/2024 @ 0414 BY AB Performed at [facility], [address] [place], [place] [address]   Urine Culture     Status: Abnormal   Collection Time: 06/13/24  1:20 PM   Specimen: Urine, Clean Catch  Result Value Ref Range Status   Specimen  Description   Final    URINE, CLEAN CATCH Performed at [facility], [address] [place], [place] [address]    Special Requests   Final    NONE Performed at [facility], [address], [place], [place] [address]    Culture (A)  Final    <10,000 COLONIES/mL INSIGNIFICANT GROWTH Performed at [facility], [address] [place], [place] [address]    Report Status 06/14/2024 FINAL  Final  Culture, blood (Routine X 2) w Reflex to ID Panel     Status: Abnormal   Collection Time: 06/14/24  7:44 AM  Specimen: BLOOD  Result Value Ref Range Status   Specimen Description   Final    BLOOD RIGHT ANTECUBITAL Performed at [facility], [address], [place], [place] [address]    Special Requests   Final    BOTTLES DRAWN AEROBIC AND ANAEROBIC Blood Culture adequate volume Performed at [facility], [address], [place], [place] [address]    Culture  Setup Time   Final    GRAM POSITIVE COCCI IN BOTH AEROBIC AND ANAEROBIC BOTTLES Gram Stain Report Called to,Read Back By and Verified With: DSABRA MOLT ON 06/15/2024 @12 :50 BY T.HAMER Performed at [facility], [address], [place], [place] [address]    Culture (A)  Final    STAPHYLOCOCCUS EPIDERMIDIS SUSCEPTIBILITIES PERFORMED ON PREVIOUS CULTURE WITHIN THE LAST 5 DAYS. Performed at [facility], [address], [place], [place] [address]    Report Status 06/17/2024 FINAL  Final  Culture, blood (Routine X 2) w Reflex to ID Panel     Status: Abnormal   Collection Time: 06/14/24  7:44 AM   Specimen: BLOOD  Result Value Ref Range Status   Specimen Description   Final    BLOOD LEFT ANTECUBITAL Performed at [facility], [address], [place], [place] [address]    Special Requests   Final    BOTTLES DRAWN AEROBIC AND ANAEROBIC Blood Culture adequate volume Performed at [facility], [address] [place], [place] [address]    Culture  Setup Time   Final    GRAM POSITIVE COCCI IN BOTH AEROBIC AND ANAEROBIC BOTTLES Gram  Stain Report Called to,Read Back By and Verified With: B. ROBERTSON ON 06/15/2024 @13 :49 BY T.HAMER Performed at [facility], [address], [place], [place] [address]    Culture (A)  Final    STAPHYLOCOCCUS EPIDERMIDIS SUSCEPTIBILITIES PERFORMED ON PREVIOUS CULTURE WITHIN THE LAST 5 DAYS. Performed at [facility], [address] [place], [place] [address]    Report Status 06/17/2024 FINAL  Final  Culture, blood (Routine X 2) w Reflex to ID Panel     Status: None   Collection Time: 06/16/24  2:30 AM   Specimen: BLOOD LEFT ARM  Result Value Ref Range Status   Specimen Description BLOOD LEFT ARM  Final   Special Requests AEROBIC BOTTLE ONLY Blood Culture adequate volume  Final   Culture   Final    NO GROWTH 5 DAYS Performed at [facility], [address], [place], [place] [address]    Report Status 06/21/2024 FINAL  Final  Culture, blood (Routine X 2) w Reflex to ID Panel     Status: None   Collection Time: 06/16/24  2:35 AM   Specimen: Right Antecubital; Blood  Result Value Ref Range Status   Specimen Description RIGHT ANTECUBITAL  Final   Special Requests   Final    BOTTLES DRAWN AEROBIC AND ANAEROBIC Blood Culture adequate volume   Culture   Final    NO GROWTH 5 DAYS Performed at [facility], [address] [place], [place] [address]    Report Status 06/21/2024 FINAL  Final  Surgical pcr screen     Status: None   Collection Time: 06/25/24 12:23 PM   Specimen: Nasal Mucosa; Nasal Swab  Result Value Ref Range Status   MRSA, PCR NEGATIVE NEGATIVE Final   Staphylococcus aureus NEGATIVE NEGATIVE Final    Comment: (NOTE) The Xpert SA Assay (FDA approved for NASAL specimens in patients 41 years of age and older), is one component  of a comprehensive surveillance program. It is not intended to diagnose infection nor to guide or monitor treatment. Performed at [facility], [address] [place], [place] [address]   SARS Coronavirus 2 by RT PCR (hospital order,  performed in [facility] hospital lab) *cepheid single result test* Nasal Mucosa     Status: None   Collection Time: 06/25/24 12:24 PM   Specimen: Nasal Mucosa; Nasal Swab  Result Value Ref Range Status   SARS Coronavirus 2 by RT PCR NEGATIVE NEGATIVE Final    Comment: Performed at [facility], [address] [place], [place] [address]  Aerobic/Anaerobic Culture w Gram Stain (surgical/deep wound)     Status: None (Preliminary result)   Collection Time: 06/26/24 11:39 AM   Specimen: Path Tissue  Result Value Ref Range Status   Specimen Description TISSUE  Final   Special Requests NONE  Final   Gram Stain   Final    FEW WBC PRESENT, PREDOMINANTLY PMN NO ORGANISMS SEEN    Culture   Final    NO GROWTH < 24 HOURS Performed at [facility], [address] [place], [place] [address]    Report Status PENDING  Incomplete  Aerobic/Anaerobic Culture w Gram Stain (surgical/deep wound)     Status: None (Preliminary result)   Collection Time: 06/26/24 11:41 AM   Specimen: Path Tissue  Result Value Ref Range Status   Specimen Description TISSUE  Final   Special Requests NONE  Final   Gram Stain   Final    RARE WBC PRESENT, PREDOMINANTLY PMN NO ORGANISMS SEEN    Culture   Final    NO GROWTH < 24 HOURS Performed at [facility], [address] [place], [place] [address]    Report Status PENDING  Incomplete  Aerobic/Anaerobic Culture w Gram Stain (surgical/deep wound)     Status: None (Preliminary result)   Collection Time: 06/26/24  2:48 PM   Specimen: Path Tissue  Result Value Ref Range Status   Specimen Description TISSUE  Final   Special Requests TRICUSPID VALVE  Final   Gram Stain NO WBC SEEN NO ORGANISMS SEEN   Final   Culture   Final    NO GROWTH < 12 HOURS Performed at [facility], [address] [place], [place] [address]    Report Status PENDING  Incomplete    Studies/Results: DG Chest Port 1 View Result Date: 06/27/2024 CLINICAL DATA:   Heard valve replacement EXAM: PORTABLE CHEST 1 VIEW COMPARISON:  06/26/2024 FINDINGS: Endotracheal tube, NG 2 mediastinal drain are unchanged. Central venous line with tip the RIGHT atrium. Bilateral chest tubes. Again demonstrated probable small LEFT apical pneumothorax thickened pleura. Some improvement aeration lung bases. No pulmonary edema. IMPRESSION: 1. Stable support apparatus. 2. Central venous line in RIGHT atrium. 3. Stable small LEFT apical pneumothorax. 4. Improved aeration lung bases. Electronically Signed   By: Jackquline Boxer M.D.   On: 06/27/2024 08:32   DG Chest Port 1 View Result Date: 06/26/2024 CLINICAL DATA:  Status post aortic valve replacement EXAM: PORTABLE CHEST 1 VIEW COMPARISON:  06/12/2024 FINDINGS: Interval intubation, tip of the endotracheal tube is about 4.2 cm superior to the carina. Enteric tube tip and side port at the gastric fundus. Right IJ central venous catheter with tip at the low right atrium. Sternotomy and aortic valve prosthesis. Interim placement of mediastinal and bilateral chest drainage catheters. Mild cardiomegaly. Small bilateral pleural effusions. Bandlike areas of atelectasis in  the mid and lower lungs. Possible small left apical pneumothorax. IMPRESSION: 1. Interval intubation, placement of mediastinal and bilateral chest drainage catheters, support lines and tubes as above. Possible small left apical pneumothorax. 2. Mild cardiomegaly with small bilateral pleural effusions with bandlike areas of atelectasis in the mid and lower lungs. Electronically Signed   By: Luke Bun M.D.   On: 06/26/2024 20:04      Assessment/Plan:  INTERVAL HISTORY: He is status post cardiothoracic surgery   Principal Problem:   Endocarditis of prosthetic aortic valve Active Problems:   Essential hypertension   Chest pain   Chronic heart failure with preserved ejection fraction (HFpEF) (HCC)   S/P CABG x 2   S/P AVR (aortic valve replacement)   BPH with  obstruction/lower urinary tract symptoms   SIRS (systemic inflammatory response syndrome) (HCC)   Bacteremia   Acute embolic stroke (HCC)   Endocarditis of tricuspid valve   Congestive heart failure (HCC)   Prosthetic valve endocarditis   Acute stroke due to ischemia (HCC)    Kyle Matthews is a 78 y.o. 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  His creatinine is up a bit to 1.59  #1 prosthetic and native valve endocarditis due to MRSE:  We would like to use vancomycin  for CNS penetration but certainly if necessary we could move to other antibiotics hopefully his levels come down and renal function improves and we do not need to change therapy  #2 AKI: Likely multifactorial given that he is now status post CT surgery and also with vancomycin  levels being supratherapeutic today   CRITICAL CARE Performed by: Jomarie Salinas Dam   Total critical care time: 30 minutes  Critical care time was exclusive of separately billable procedures and treating other patients.  Critical care was necessary to treat or prevent imminent or life-threatening deterioration.  Critical care was time spent personally by me on the following activities: development of treatment plan with patient and/or surrogate as well as nursing, discussions with consultants, evaluation of patient's response to treatment, examination of patient, obtaining history from patient or surrogate, ordering and performing treatments and interventions, ordering and review of laboratory studies, ordering and review of radiographic studies, pulse  oximetry and re-evaluation of patient's condition.  Evaluation of the patient requires complex antimicrobial therapy evaluation, counseling , isolation needs to reduce disease transmission and risk assessment and mitigation.      LOS: 12 days   Jomarie Salinas Rothman 06/27/2024, 5:14 PM

## 2024-06-27 NOTE — Progress Notes (Signed)
 EVENING ROUNDS NOTE :     [address]       [name] [address]             [phone]                 1 Day Post-Op Procedure(s) (LRB): TRICUSPID VALVE REPLACEMENT USING MOSIAC BRIOPROSTHESIS VALVE SIZE (N/A) ECHOCARDIOGRAM, TRANSESOPHAGEAL (N/A) CORONARY ARTERY BYPASS GRAFTING (CABG) TIMES ONE USING ENDOSCOPICALLY HARVESTED LEFT GREATER SAPHENOUS VEIN (N/A) BENTALL PROCEDURE USING KONECT AORTIC VALVE CONDUIT SIZE (N/A) REDO STERNOTOMY (N/A)   Total Length of Stay:  LOS: 12 days  Events:   No events Ambulated today     BP (!) 141/122   Pulse 90   Temp 99 F (37.2 C)   Resp 15   Ht (P) 5' 2 (1.575 m)   Wt 76.3 kg   SpO2 96%   BMI (P) 30.77 kg/m   PAP: (5-20)/(-7-14) 16/13  Vent Mode: PSV;CPAP FiO2 (%):  [40 %-50 %] 40 % Set Rate:  [4 bmp-16 bmp] 4 bmp Vt Set:  [430 mL] 430 mL PEEP:  [4 cmH20-5 cmH20] 4 cmH20 Pressure Support:  [10 cmH20] 10 cmH20 Plateau Pressure:  [14 cmH20] 14 cmH20   sodium chloride      sodium chloride  Stopped (06/27/24 0558)   sodium chloride  10 mL/hr at 06/27/24 1700    ceFAZolin  (ANCEF ) IV Stopped (06/27/24 1330)   dexmedetomidine  (PRECEDEX ) IV infusion Stopped (06/27/24 0822)   lactated ringers  20 mL/hr at 06/27/24 1700   lactated ringers  20 mL/hr at 06/26/24 2024   nitroGLYCERIN  Stopped (06/26/24 2027)   phenylephrine  (NEO-SYNEPHRINE) Adult infusion 30 mcg/min (06/27/24 1700)    I/O last 3 completed shifts: In: 7493.7 [I.V.:3285.1; Blood:2331; NG/GT:180; IV Piggyback:1697.6] Out: 2577 [Urine:1007; Emesis/NG output:230; Blood:850; Chest Tube:490]      Latest Ref Rng & Units 06/27/2024    4:52 PM 06/27/2024   12:59 PM 06/27/2024   10:17 AM  CBC  WBC 4.0 - 10.5 K/uL 17.3     Hemoglobin 13.0 - 17.0 g/dL 8.3  7.8  7.5   Hematocrit 39.0 - 52.0 % 25.1  23.0  22.0   Platelets 150 - 400 K/uL 141          Latest Ref Rng & Units 06/27/2024    4:52 PM 06/27/2024   12:59 PM 06/27/2024   10:17 AM  BMP   Glucose 70 - 99 mg/dL 869     BUN 8 - 23 mg/dL 22     Creatinine 9.38 - 1.24 mg/dL 7.97     Sodium 864 - 854 mmol/L 138  139  141   Potassium 3.5 - 5.1 mmol/L 4.7  4.7  4.5   Chloride 98 - 111 mmol/L 106     CO2 22 - 32 mmol/L 21     Calcium  8.9 - 10.3 mg/dL 8.0       ABG    Component Value Date/Time   PHART 7.361 06/27/2024 1259   PCO2ART 40.4 06/27/2024 1259   PO2ART 64 (L) 06/27/2024 1259   HCO3 22.9 06/27/2024 1259   TCO2 24 06/27/2024 1259   ACIDBASEDEF 2.0 06/27/2024 1259   O2SAT 92 06/27/2024 1259       Linnie Rayas, MD 06/27/2024 5:53 PM

## 2024-06-27 NOTE — Plan of Care (Signed)
   Problem: Clinical Measurements: Goal: Ability to maintain clinical measurements within normal limits will improve Outcome: Progressing Goal: Respiratory complications will improve Outcome: Progressing

## 2024-06-27 NOTE — Procedures (Signed)
 Extubation Procedure Note  Patient Details:   Name: Kyle Matthews DOB: 01-Feb-1946 MRN: [id number]   Airway Documentation:    Vent end date: 06/27/24 Vent end time: 0908   Evaluation  O2 sats: stable throughout Complications: No apparent complications Patient did tolerate procedure well. Bilateral Breath Sounds: Diminished, Clear   Yes PT ext to 3L [name]. Positive cuff leak noted FVC:1.1L NIF: -22 cmH2O  [name] V 06/27/2024, 9:09 AM

## 2024-06-27 NOTE — Progress Notes (Signed)
 PHARMACY NOTE:  ANTIMICROBIAL RENAL DOSAGE ADJUSTMENT  Current antimicrobial regimen includes a mismatch between antimicrobial dosage and estimated renal function.  As per policy approved by the Pharmacy & Therapeutics and Medical Executive Committees, the antimicrobial dosage will be adjusted accordingly.  Current antimicrobial dosage:  Ancef  2gm IV Q8H x 3 more doses  Indication: surgical prophylaxis  Renal Function:  Estimated Creatinine Clearance: 28.3 mL/min (A) (by C-G formula based on SCr of 2.02 mg/dL (H)). []      On intermittent HD, scheduled: []      On CRRT    Antimicrobial dosage has been changed to:  Ancef  2gm IV Q12H x 2 doses   Temara Lanum D. Lendell, PharmD, BCPS, BCCCP 06/27/2024, 8:20 PM

## 2024-06-28 ENCOUNTER — Inpatient Hospital Stay (HOSPITAL_COMMUNITY)

## 2024-06-28 DIAGNOSIS — T826XXA Infection and inflammatory reaction due to cardiac valve prosthesis, initial encounter: Secondary | ICD-10-CM | POA: Diagnosis not present

## 2024-06-28 DIAGNOSIS — Z952 Presence of prosthetic heart valve: Secondary | ICD-10-CM | POA: Diagnosis not present

## 2024-06-28 DIAGNOSIS — R7881 Bacteremia: Secondary | ICD-10-CM | POA: Diagnosis not present

## 2024-06-28 DIAGNOSIS — I330 Acute and subacute infective endocarditis: Secondary | ICD-10-CM | POA: Diagnosis not present

## 2024-06-28 LAB — BASIC METABOLIC PANEL WITH GFR
Anion gap: 12 (ref 5–15)
BUN: 29 mg/dL — ABNORMAL HIGH (ref 8–23)
CO2: 21 mmol/L — ABNORMAL LOW (ref 22–32)
Calcium: 7.7 mg/dL — ABNORMAL LOW (ref 8.9–10.3)
Chloride: 103 mmol/L (ref 98–111)
Creatinine, Ser: 2.84 mg/dL — ABNORMAL HIGH (ref 0.61–1.24)
GFR, Estimated: 22 mL/min — ABNORMAL LOW (ref >60)
Glucose, Bld: 116 mg/dL — ABNORMAL HIGH (ref 70–99)
Potassium: 4.7 mmol/L (ref 3.5–5.1)
Sodium: 136 mmol/L (ref 135–145)

## 2024-06-28 LAB — VANCOMYCIN, TROUGH: Vancomycin Tr: 33 ug/mL (ref 15–20)

## 2024-06-28 LAB — CBC
HCT: 23.9 % — ABNORMAL LOW (ref 39.0–52.0)
Hemoglobin: 7.9 g/dL — ABNORMAL LOW (ref 13.0–17.0)
MCH: 29.9 pg (ref 26.0–34.0)
MCHC: 33.1 g/dL (ref 30.0–36.0)
MCV: 90.5 fL (ref 80.0–100.0)
Platelets: 150 K/uL (ref 150–400)
RBC: 2.64 MIL/uL — ABNORMAL LOW (ref 4.22–5.81)
RDW: 16.3 % — ABNORMAL HIGH (ref 11.5–15.5)
WBC: 17.4 K/uL — ABNORMAL HIGH (ref 4.0–10.5)
nRBC: 0 % (ref 0.0–0.2)

## 2024-06-28 LAB — ACID FAST SMEAR (AFB, MYCOBACTERIA)
Acid Fast Smear: NEGATIVE
Acid Fast Smear: NEGATIVE
Acid Fast Smear: NEGATIVE

## 2024-06-28 LAB — COOXEMETRY PANEL
Carboxyhemoglobin: 1.4 % (ref 0.5–1.5)
Methemoglobin: 1.4 % (ref 0.0–1.5)
O2 Saturation: 57.7 %
Total hemoglobin: 8.1 g/dL — ABNORMAL LOW (ref 12.0–16.0)

## 2024-06-28 LAB — GLUCOSE, CAPILLARY
Glucose-Capillary: 113 mg/dL — ABNORMAL HIGH (ref 70–99)
Glucose-Capillary: 117 mg/dL — ABNORMAL HIGH (ref 70–99)
Glucose-Capillary: 122 mg/dL — ABNORMAL HIGH (ref 70–99)
Glucose-Capillary: 124 mg/dL — ABNORMAL HIGH (ref 70–99)
Glucose-Capillary: 134 mg/dL — ABNORMAL HIGH (ref 70–99)
Glucose-Capillary: 157 mg/dL — ABNORMAL HIGH (ref 70–99)

## 2024-06-28 MED ORDER — ROSUVASTATIN CALCIUM 5 MG PO TABS
10.0000 mg | ORAL_TABLET | Freq: Every day | ORAL | Status: DC
  Administered 2024-06-28 – 2024-07-04 (×7): 10 mg via ORAL
  Filled 2024-06-28 (×7): qty 2

## 2024-06-28 MED ORDER — TRAMADOL HCL 50 MG PO TABS: 50.0000 mg | ORAL_TABLET | Freq: Two times a day (BID) | ORAL | Status: DC | PRN

## 2024-06-28 MED ORDER — FUROSEMIDE 10 MG/ML IJ SOLN
80.0000 mg | Freq: Once | INTRAMUSCULAR | Status: AC
  Administered 2024-06-28: 80 mg via INTRAVENOUS
  Filled 2024-06-28: qty 8

## 2024-06-28 MED ORDER — HYDROMORPHONE HCL 1 MG/ML IJ SOLN
0.5000 mg | INTRAMUSCULAR | Status: DC | PRN
  Administered 2024-06-28: 1 mg via INTRAVENOUS
  Filled 2024-06-28: qty 1

## 2024-06-28 MED ORDER — ORAL CARE MOUTH RINSE: 15.0000 mL | OROMUCOSAL | Status: DC | PRN

## 2024-06-28 MED ORDER — METOCLOPRAMIDE HCL 5 MG/ML IJ SOLN
10.0000 mg | Freq: Four times a day (QID) | INTRAMUSCULAR | Status: DC
  Administered 2024-06-28 – 2024-06-29 (×3): 10 mg via INTRAVENOUS
  Filled 2024-06-28 (×3): qty 2

## 2024-06-28 MED FILL — Thrombin (Recombinant) For Soln 20000 Unit: CUTANEOUS | Qty: 1 | Status: AC

## 2024-06-28 NOTE — TOC Progression Note (Addendum)
 Transition of Care [facility]) - Progression Note    Patient Details  Name: Kyle Matthews MRN: [id number] Date of Birth: 22-Jun-1946  [facility] CM/SW Contact  Justina Delcia Czar, RN Phone Number: 06/28/2024, 4:27 PM  Clinical Narrative:     Spoke to pt and wife at bedside. Pt gave permission to speak to wife. Pt wants to updated his Adv Directive. Consult to Spiritual care placed.  Has scale at home for daily weights. Wife states pt has a Living Better with HF booklet. Tries to adhere to low sodium/heart healthy diet.   Contacted [facility], Pam RN for possible Home IV abx. Pt HH arranged with [facility] pre[facility].   Will continue to follow for discharge needs.   Expected Discharge Plan: Home w Home Health Services Barriers to Discharge: Continued Medical Work up  Expected Discharge Plan and Services In-house Referral: NA Discharge Planning Services: CM Consult Post Acute Care Choice: NA Living arrangements for the past 2 months: Single Family Home                           HH Arranged: NA           Social Determinants of Health (SDOH) Interventions SDOH Screenings   Food Insecurity: No Food Insecurity (06/12/2024)  Recent Concern: Food Insecurity - Food Insecurity Present (05/22/2024)  Housing: Low Risk  (06/12/2024)  Transportation Needs: No Transportation Needs (06/12/2024)  Utilities: Not At Risk (06/12/2024)  Depression (PHQ2-9): High Risk (02/12/2024)  Social Connections: Moderately Integrated (06/12/2024)  Tobacco Use: High Risk (06/26/2024)    Readmission Risk Interventions    11/08/2023   10:21 AM 10/25/2023    3:42 PM  Readmission Risk Prevention Plan  Transportation Screening Complete Complete  PCP or Specialist Appt within 3-5 Days  Complete  HRI or Home Care Consult  Complete  Palliative Care Screening  Not Applicable  Medication Review (RN Care Manager)  Complete

## 2024-06-28 NOTE — Progress Notes (Signed)
 Subjective:  Patient with nausea and vomiting in the room while we are with him  Antibiotics:  Anti-infectives (From admission, onward)    Start     Dose/Rate Route Frequency Ordered Stop   06/28/24 0100  ceFAZolin  (ANCEF ) IVPB 2g/100 mL premix        2 g 200 mL/hr over 30 Minutes Intravenous Every 12 hours 06/27/24 2021 06/29/24 0059   06/27/24 0818  vancomycin  variable dose per unstable renal function (pharmacist dosing)  Status:  Discontinued         Does not apply See admin instructions 06/27/24 0818 06/28/24 1341   06/26/24 2215  vancomycin  (VANCOCIN ) IVPB 1000 mg/200 mL premix  Status:  Discontinued        1,000 mg 200 mL/hr over 60 Minutes Intravenous  Once 06/26/24 1915 06/26/24 1939   06/26/24 2200  ceFAZolin  (ANCEF ) IVPB 2g/100 mL premix  Status:  Discontinued        2 g 200 mL/hr over 30 Minutes Intravenous Every 8 hours 06/26/24 1915 06/27/24 2021   06/26/24 2100  vancomycin  (VANCOCIN ) IVPB 1000 mg/200 mL premix  Status:  Discontinued        1,000 mg 200 mL/hr over 60 Minutes Intravenous Every 12 hours 06/26/24 1936 06/27/24 0754   06/26/24 0400  vancomycin  (VANCOCIN ) IVPB 1000 mg/200 mL premix        1,000 mg 200 mL/hr over 60 Minutes Intravenous To Surgery 06/25/24 1322 06/26/24 0846   06/26/24 0400  ceFAZolin  (ANCEF ) IVPB 2g/100 mL premix  Status:  Discontinued        2 g 200 mL/hr over 30 Minutes Intravenous To Surgery 06/25/24 1322 06/26/24 1909   06/26/24 0400  ceFAZolin  (ANCEF ) IVPB 2g/100 mL premix        2 g 200 mL/hr over 30 Minutes Intravenous To Surgery 06/25/24 1322 06/26/24 1712   06/25/24 2300  vancomycin  (VANCOCIN ) IVPB 1000 mg/200 mL premix  Status:  Discontinued        1,000 mg 200 mL/hr over 60 Minutes Intravenous Every 12 hours 06/25/24 1316 06/26/24 1909   06/22/24 2300  vancomycin  (VANCOREADY) IVPB 1250 mg/250 mL  Status:  Discontinued        1,250 mg 166.7 mL/hr over 90 Minutes Intravenous Every 12 hours 06/22/24 1141 06/25/24 1316    06/19/24 2200  vancomycin  (VANCOREADY) IVPB 1500 mg/300 mL  Status:  Discontinued        1,500 mg 150 mL/hr over 120 Minutes Intravenous Every 12 hours 06/19/24 1246 06/22/24 1141   06/18/24 0000  vancomycin  (VANCOCIN ) IVPB 1000 mg/200 mL premix  Status:  Discontinued        1,000 mg 200 mL/hr over 60 Minutes Intravenous Every 12 hours 06/17/24 1115 06/19/24 1246   06/15/24 1000  vancomycin  (VANCOREADY) IVPB 1250 mg/250 mL  Status:  Discontinued        1,250 mg 166.7 mL/hr over 90 Minutes Intravenous Every 24 hours 06/14/24 1029 06/17/24 1115   06/14/24 0845  vancomycin  (VANCOREADY) IVPB 1500 mg/300 mL        1,500 mg 150 mL/hr over 120 Minutes Intravenous  Once 06/14/24 0747 06/14/24 1133   06/13/24 0000  ciprofloxacin  (CIPRO ) tablet 500 mg  Status:  Discontinued        500 mg Oral 2 times daily 06/12/24 2311 06/13/24 0854       Medications: Scheduled Meds:  acetaminophen   1,000 mg Oral Q6H   Or   acetaminophen  (TYLENOL ) oral liquid 160  mg/5 mL  1,000 mg Per Tube Q6H   aspirin  EC  325 mg Oral Daily   Or   aspirin   324 mg Per Tube Daily   bisacodyl   10 mg Oral Daily   Or   bisacodyl   10 mg Rectal Daily   Chlorhexidine  Gluconate Cloth  6 each Topical Daily   docusate sodium   200 mg Oral Daily   finasteride   5 mg Oral QPM   insulin  aspart  0-24 Units Subcutaneous Q4H   midodrine   10 mg Oral Q8H   pantoprazole   40 mg Oral Daily   rosuvastatin   10 mg Oral Daily   sodium chloride  flush  3 mL Intravenous Q12H   Continuous Infusions:   ceFAZolin  (ANCEF ) IV 2 g (06/28/24 1326)   phenylephrine  (NEO-SYNEPHRINE) Adult infusion Stopped (06/28/24 0952)   PRN Meds:.HYDROmorphone  (DILAUDID ) injection, ondansetron  (ZOFRAN ) IV, mouth rinse, oxyCODONE , sodium chloride  flush, traMADol     Objective: Weight change: -3 kg  Intake/Output Summary (Last 24 hours) at 06/28/2024 1342 Last data filed at 06/28/2024 1245 Gross per 24 hour  Intake 1618.89 ml  Output 1151 ml  Net 467.89 ml    Blood pressure 110/71, pulse 89, temperature 98.4 F (36.9 C), resp. rate 13, height (P) 5' 2 (1.575 m), weight 73.3 kg, SpO2 96%. Temp:  [97.9 F (36.6 C)-99 F (37.2 C)] 98.4 F (36.9 C) (07/11 1100) Pulse Rate:  [89-152] 89 (07/11 1100) Resp:  [12-26] 13 (07/11 1100) BP: (89-145)/(32-122) 110/71 (07/11 1100) SpO2:  [78 %-100 %] 96 % (07/11 1100) Arterial Line BP: (80-118)/(51-87) 82/76 (07/11 0245) Weight:  [73.3 kg] 73.3 kg (07/11 0500)  Physical Exam: Physical Exam Eyes:     General:        Right eye: No discharge.        Left eye: No discharge.  Cardiovascular:     Rate and Rhythm: Normal rate and regular rhythm.  Pulmonary:     Effort: No respiratory distress.     Breath sounds: No wheezing.  Neurological:     General: No focal deficit present.     Mental Status: He is alert and oriented to person, place, and time.  Psychiatric:        Mood and Affect: Mood normal.        Behavior: Behavior normal.        Thought Content: Thought content normal.        Judgment: Judgment normal.     Chest tubes in place CBC:    BMET Recent Labs    06/27/24 1652 06/28/24 0446  NA 138 136  K 4.7 4.7  CL 106 103  CO2 21* 21*  GLUCOSE 130* 116*  BUN 22 29*  CREATININE 2.02* 2.84*  CALCIUM  8.0* 7.7*     Liver Panel  No results for input(s): PROT, ALBUMIN , AST, ALT, ALKPHOS, BILITOT, BILIDIR, IBILI in the last 72 hours.     Sedimentation Rate No results for input(s): ESRSEDRATE in the last 72 hours. C-Reactive Protein No results for input(s): CRP in the last 72 hours.  Micro Results: Recent Results (from the past 720 hours)  Culture, blood (Routine X 2) w Reflex to ID Panel     Status: Abnormal   Collection Time: 06/12/24 10:55 PM   Specimen: BLOOD  Result Value Ref Range Status   Specimen Description   Final    BLOOD BLOOD LEFT ARM Performed at [facility], [address] [place], [place] [address]    Special Requests   Final  BOTTLES DRAWN AEROBIC AND ANAEROBIC Blood Culture adequate volume Performed at [facility], [address], [place], [place] [address]    Culture  Setup Time   Final    GRAM POSITIVE COCCI IN BOTH AEROBIC AND ANAEROBIC BOTTLES CRITICAL VALUE NOTED.  VALUE IS CONSISTENT WITH PREVIOUSLY REPORTED AND CALLED VALUE. JESSICA ELLER AT 2315 06/13/24 BY A. SNYDER GRAM STAIN REVIEWED-AGREE WITH RESULT    Culture (A)  Final    STAPHYLOCOCCUS EPIDERMIDIS SUSCEPTIBILITIES PERFORMED ON PREVIOUS CULTURE WITHIN THE LAST 5 DAYS. Performed at [facility], [address] [place], [place] [address]    Report Status 06/17/2024 FINAL  Final  Culture, blood (Routine X 2) w Reflex to ID Panel     Status: Abnormal   Collection Time: 06/12/24 10:57 PM   Specimen: BLOOD LEFT HAND  Result Value Ref Range Status   Specimen Description   Final    BLOOD LEFT HAND Performed at [facility], [address], [place], [place] [address]    Special Requests   Final    BOTTLES DRAWN AEROBIC AND ANAEROBIC Blood Culture adequate volume Performed at [facility], [address] [place], [place] [address]    Culture  Setup Time   Final    GRAM POSITIVE COCCI IN BOTH AEROBIC AND ANAEROBIC BOTTLES Gram Stain Report Called to,Read Back By and Verified With: JESSICA ELLER,RN AT 2315 06/13/24 BY A. SNYDER CRITICAL RESULT CALLED TO, READ BACK BY AND VERIFIED WITH: LOISE MORONES RN 06/14/2024 @ 0414 BY AB GRAM STAIN REVIEWED-AGREE WITH RESULT Performed at [facility], [address], [place], [place] [address]    Culture STAPHYLOCOCCUS EPIDERMIDIS (A)  Final   Report Status 06/17/2024 FINAL  Final   Organism ID, Bacteria STAPHYLOCOCCUS EPIDERMIDIS  Final      Susceptibility   Staphylococcus epidermidis - MIC*    CIPROFLOXACIN  >=8 RESISTANT Resistant     ERYTHROMYCIN >=8 RESISTANT Resistant     GENTAMICIN  <=0.5 SENSITIVE Sensitive     OXACILLIN >=4 RESISTANT Resistant     TETRACYCLINE >=16 RESISTANT  Resistant     VANCOMYCIN  <=0.5 SENSITIVE Sensitive     TRIMETH/SULFA 160 RESISTANT Resistant     CLINDAMYCIN >=8 RESISTANT Resistant     RIFAMPIN <=0.5 SENSITIVE Sensitive     Inducible Clindamycin NEGATIVE Sensitive     * STAPHYLOCOCCUS EPIDERMIDIS  Blood Culture ID Panel (Reflexed)     Status: Abnormal   Collection Time: 06/12/24 10:57 PM  Result Value Ref Range Status   Enterococcus faecalis NOT DETECTED NOT DETECTED Final   Enterococcus Faecium NOT DETECTED NOT DETECTED Final   Listeria monocytogenes NOT DETECTED NOT DETECTED Final   Staphylococcus species DETECTED (A) NOT DETECTED Final    Comment: CRITICAL RESULT CALLED TO, READ BACK BY AND VERIFIED WITH: N BAUGH RN 06/14/2024 @ 0414 BY AB    Staphylococcus aureus (BCID) NOT DETECTED NOT DETECTED Final   Staphylococcus epidermidis DETECTED (A) NOT DETECTED Final    Comment: Methicillin (oxacillin) resistant coagulase negative staphylococcus. Possible blood culture contaminant (unless isolated from more than one blood culture draw or clinical case suggests pathogenicity). No antibiotic treatment is indicated for blood  culture contaminants. CRITICAL RESULT CALLED TO, READ BACK BY AND VERIFIED WITH: N BAUGH RN 06/14/2024 @ 0414 BY AB    Staphylococcus lugdunensis NOT DETECTED NOT DETECTED Final   Streptococcus species NOT DETECTED NOT DETECTED Final   Streptococcus agalactiae NOT DETECTED NOT DETECTED Final   Streptococcus pneumoniae NOT DETECTED NOT  DETECTED Final   Streptococcus pyogenes NOT DETECTED NOT DETECTED Final   A.calcoaceticus-baumannii NOT DETECTED NOT DETECTED Final   Bacteroides fragilis NOT DETECTED NOT DETECTED Final   Enterobacterales NOT DETECTED NOT DETECTED Final   Enterobacter cloacae complex NOT DETECTED NOT DETECTED Final   Escherichia coli NOT DETECTED NOT DETECTED Final   Klebsiella aerogenes NOT DETECTED NOT DETECTED Final   Klebsiella oxytoca NOT DETECTED NOT DETECTED Final   Klebsiella pneumoniae NOT  DETECTED NOT DETECTED Final   Proteus species NOT DETECTED NOT DETECTED Final   Salmonella species NOT DETECTED NOT DETECTED Final   Serratia marcescens NOT DETECTED NOT DETECTED Final   Haemophilus influenzae NOT DETECTED NOT DETECTED Final   Neisseria meningitidis NOT DETECTED NOT DETECTED Final   Pseudomonas aeruginosa NOT DETECTED NOT DETECTED Final   Stenotrophomonas maltophilia NOT DETECTED NOT DETECTED Final   Candida albicans NOT DETECTED NOT DETECTED Final   Candida auris NOT DETECTED NOT DETECTED Final   Candida glabrata NOT DETECTED NOT DETECTED Final   Candida krusei NOT DETECTED NOT DETECTED Final   Candida parapsilosis NOT DETECTED NOT DETECTED Final   Candida tropicalis NOT DETECTED NOT DETECTED Final   Cryptococcus neoformans/gattii NOT DETECTED NOT DETECTED Final   Methicillin resistance mecA/C DETECTED (A) NOT DETECTED Final    Comment: CRITICAL RESULT CALLED TO, READ BACK BY AND VERIFIED WITHBETHA LOISE MORONES RN 06/14/2024 @ 0414 BY AB Performed at [facility], [address], [place], [place] [address]   Urine Culture     Status: Abnormal   Collection Time: 06/13/24  1:20 PM   Specimen: Urine, Clean Catch  Result Value Ref Range Status   Specimen Description   Final    URINE, CLEAN CATCH Performed at [facility], [address], [place], [place] [address]    Special Requests   Final    NONE Performed at [facility], [address] [place] [address] [place], [place] [address]    Culture (A)  Final    <10,000 COLONIES/mL INSIGNIFICANT GROWTH Performed at [facility], [address] [place], [place] [address]    Report Status 06/14/2024 FINAL  Final  Culture, blood (Routine X 2) w Reflex to ID Panel     Status: Abnormal   Collection Time: 06/14/24  7:44 AM   Specimen: BLOOD  Result Value Ref Range Status   Specimen Description   Final    BLOOD RIGHT ANTECUBITAL Performed at [facility], [address], [place], [place] [address]    Special Requests   Final     BOTTLES DRAWN AEROBIC AND ANAEROBIC Blood Culture adequate volume Performed at [facility], [address], [place], [place] [address]    Culture  Setup Time   Final    GRAM POSITIVE COCCI IN BOTH AEROBIC AND ANAEROBIC BOTTLES Gram Stain Report Called to,Read Back By and Verified With: DSABRA MOLT ON 06/15/2024 @12 :50 BY T.HAMER Performed at [facility], [address], [place], [place] [address]    Culture (A)  Final    STAPHYLOCOCCUS EPIDERMIDIS SUSCEPTIBILITIES PERFORMED ON PREVIOUS CULTURE WITHIN THE LAST 5 DAYS. Performed at [facility], [address] [place], [place] [address]    Report Status 06/17/2024 FINAL  Final  Culture, blood (Routine X 2) w Reflex to ID Panel     Status: Abnormal   Collection Time: 06/14/24  7:44 AM   Specimen: BLOOD  Result Value Ref Range Status   Specimen Description   Final    BLOOD LEFT ANTECUBITAL  Performed at [facility], [address] [place], [place] [address]    Special Requests   Final    BOTTLES DRAWN AEROBIC AND ANAEROBIC Blood Culture adequate volume Performed at [facility], [address] [place], [place] [address]    Culture  Setup Time   Final    GRAM POSITIVE COCCI IN BOTH AEROBIC AND ANAEROBIC BOTTLES Gram Stain Report Called to,Read Back By and Verified With: B. ROBERTSON ON 06/15/2024 @13 :49 BY T.HAMER Performed at [facility], [address] [place], [place] [address]    Culture (A)  Final    STAPHYLOCOCCUS EPIDERMIDIS SUSCEPTIBILITIES PERFORMED ON PREVIOUS CULTURE WITHIN THE LAST 5 DAYS. Performed at [facility], [address] [place], [place] [address]    Report Status 06/17/2024 FINAL  Final  Culture, blood (Routine X 2) w Reflex to ID Panel     Status: None   Collection Time: 06/16/24  2:30 AM   Specimen: BLOOD LEFT ARM  Result Value Ref Range Status   Specimen Description BLOOD LEFT ARM  Final   Special Requests AEROBIC BOTTLE ONLY Blood Culture adequate volume  Final   Culture   Final    NO  GROWTH 5 DAYS Performed at [facility], [address], [place], [place] [address]    Report Status 06/21/2024 FINAL  Final  Culture, blood (Routine X 2) w Reflex to ID Panel     Status: None   Collection Time: 06/16/24  2:35 AM   Specimen: Right Antecubital; Blood  Result Value Ref Range Status   Specimen Description RIGHT ANTECUBITAL  Final   Special Requests   Final    BOTTLES DRAWN AEROBIC AND ANAEROBIC Blood Culture adequate volume   Culture   Final    NO GROWTH 5 DAYS Performed at [facility], [address] [place], [place] [address]    Report Status 06/21/2024 FINAL  Final  Surgical pcr screen     Status: None   Collection Time: 06/25/24 12:23 PM   Specimen: Nasal Mucosa; Nasal Swab  Result Value Ref Range Status   MRSA, PCR NEGATIVE NEGATIVE Final   Staphylococcus aureus NEGATIVE NEGATIVE Final    Comment: (NOTE) The Xpert SA Assay (FDA approved for NASAL specimens in patients 68 years of age and older), is one component of a comprehensive surveillance program. It is not intended to diagnose infection nor to guide or monitor treatment. Performed at [facility], [address], [place], [place] [address]   SARS Coronavirus 2 by RT PCR (hospital order, performed in [facility] hospital lab) *cepheid single result test* Nasal Mucosa     Status: None   Collection Time: 06/25/24 12:24 PM   Specimen: Nasal Mucosa; Nasal Swab  Result Value Ref Range Status   SARS Coronavirus 2 by RT PCR NEGATIVE NEGATIVE Final    Comment: Performed at [facility], [address] [place], [place] [address]  Aerobic/Anaerobic Culture w Gram Stain (surgical/deep wound)     Status: None (Preliminary result)   Collection Time: 06/26/24 11:39 AM   Specimen: Path Tissue  Result Value Ref Range Status   Specimen Description TISSUE  Final   Special Requests NONE  Final   Gram Stain   Final    FEW WBC PRESENT, PREDOMINANTLY PMN NO ORGANISMS SEEN    Culture   Final    NO GROWTH 2  DAYS NO ANAEROBES ISOLATED; CULTURE IN PROGRESS FOR 5 DAYS Performed at [facility] [place] [facility], [address], [place], [place] [address]    Report Status PENDING  Incomplete  Acid Fast Smear (AFB)     Status: None   Collection Time: 06/26/24 11:39 AM   Specimen: Path Tissue  Result Value Ref Range Status   AFB Specimen Processing Comment  Final    Comment: Tissue Grinding and Digestion/Decontamination   Acid Fast Smear Negative  Final    Comment: (NOTE) Performed At: [facility] [address] [place], [place] [id number] Jennette Shorter MD Ey:[id number]    Source (AFB) TISSUE  Final    Comment: Performed at [facility], [address], [place], [place] [address]  Aerobic/Anaerobic Culture w Gram Stain (surgical/deep wound)     Status: None (Preliminary result)   Collection Time: 06/26/24 11:41 AM   Specimen: Path Tissue  Result Value Ref Range Status   Specimen Description TISSUE  Final   Special Requests NONE  Final   Gram Stain   Final    RARE WBC PRESENT, PREDOMINANTLY PMN NO ORGANISMS SEEN    Culture   Final    NO GROWTH 2 DAYS NO ANAEROBES ISOLATED; CULTURE IN PROGRESS FOR 5 DAYS Performed at [facility], [address] [place], [place] [address]    Report Status PENDING  Incomplete  Acid Fast Smear (AFB)     Status: None   Collection Time: 06/26/24 11:41 AM   Specimen: Path Tissue  Result Value Ref Range Status   AFB Specimen Processing Comment  Final    Comment: Tissue Grinding and Digestion/Decontamination   Acid Fast Smear Negative  Final    Comment: (NOTE) Performed At: [facility] [address] [place], [place] [id number] Jennette Shorter MD Ey:[id number]    Source (AFB) TISSUE  Final    Comment: Performed at [facility], [address], [place], [place] [address]  Aerobic/Anaerobic Culture w Gram Stain (surgical/deep wound)     Status: None (Preliminary result)   Collection Time: 06/26/24  2:48 PM   Specimen: Path Tissue   Result Value Ref Range Status   Specimen Description TISSUE  Final   Special Requests TRICUSPID VALVE  Final   Gram Stain NO WBC SEEN NO ORGANISMS SEEN   Final   Culture   Final    NO GROWTH 2 DAYS NO ANAEROBES ISOLATED; CULTURE IN PROGRESS FOR 5 DAYS Performed at [facility], [address], [place], [place] [address]    Report Status PENDING  Incomplete  Acid Fast Smear (AFB)     Status: None   Collection Time: 06/26/24  2:48 PM   Specimen: Path Tissue  Result Value Ref Range Status   AFB Specimen Processing Comment  Final    Comment: Tissue Grinding and Digestion/Decontamination   Acid Fast Smear Negative  Final    Comment: (NOTE) Performed At: [facility] [address] [place], [place] [id number] Jennette Shorter MD Ey:[id number]    Source (AFB) TRICUSPID VALVE  Final    Comment: Performed at [facility], [address] [place], [place] [address]    Studies/Results: [place] CHEST PORT 1 VIEW Result Date: 06/28/2024 CLINICAL DATA:  Endocarditis EXAM: PORTABLE CHEST 1 VIEW COMPARISON:  06/27/2024 FINDINGS: Interval extubation. No increased atelectasis. Bilateral chest tubes remain. No pneumothorax. Mediastinal drain remains. Interval removal of NG tube. No pulmonary edema IMPRESSION: 1. Interval extubation and removal of NG tube. 2. No increase in atelectasis. 3. Bilateral chest tubes.  No pneumothorax. Electronically Signed   By: Jackquline  Malva M.D.   On: 06/28/2024 08:40   DG Chest Port 1 View Result Date: 06/27/2024 CLINICAL DATA:  Heard valve replacement EXAM: PORTABLE CHEST 1 VIEW COMPARISON:  06/26/2024 FINDINGS: Endotracheal tube, NG 2 mediastinal drain are unchanged. Central venous line with tip the RIGHT atrium. Bilateral chest tubes. Again demonstrated probable small LEFT apical pneumothorax thickened pleura. Some improvement aeration lung bases. No pulmonary edema. IMPRESSION: 1. Stable support apparatus. 2. Central venous line in RIGHT atrium. 3.  Stable small LEFT apical pneumothorax. 4. Improved aeration lung bases. Electronically Signed   By: Jackquline Malva M.D.   On: 06/27/2024 08:32   DG Chest Port 1 View Result Date: 06/26/2024 CLINICAL DATA:  Status post aortic valve replacement EXAM: PORTABLE CHEST 1 VIEW COMPARISON:  06/12/2024 FINDINGS: Interval intubation, tip of the endotracheal tube is about 4.2 cm superior to the carina. Enteric tube tip and side port at the gastric fundus. Right IJ central venous catheter with tip at the low right atrium. Sternotomy and aortic valve prosthesis. Interim placement of mediastinal and bilateral chest drainage catheters. Mild cardiomegaly. Small bilateral pleural effusions. Bandlike areas of atelectasis in the mid and lower lungs. Possible small left apical pneumothorax. IMPRESSION: 1. Interval intubation, placement of mediastinal and bilateral chest drainage catheters, support lines and tubes as above. Possible small left apical pneumothorax. 2. Mild cardiomegaly with small bilateral pleural effusions with bandlike areas of atelectasis in the mid and lower lungs. Electronically Signed   By: Luke Bun M.D.   On: 06/26/2024 20:04      Assessment/Plan:  INTERVAL HISTORY: creatinine worse   Principal Problem:   Endocarditis of prosthetic aortic valve Active Problems:   Essential hypertension   Chest pain   Chronic heart failure with preserved ejection fraction (HFpEF) (HCC)   S/P CABG x 2   S/P AVR (aortic valve replacement)   BPH with obstruction/lower urinary tract symptoms   SIRS (systemic inflammatory response syndrome) (HCC)   Bacteremia   Acute embolic stroke (HCC)   Endocarditis of tricuspid valve   Congestive heart failure (HCC)   Prosthetic valve endocarditis   Acute stroke due to ischemia (HCC)    Kyle Matthews is a 78 y.o. 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  He has gotten through 2 weeks of therapy and I think this is sufficient to have treated what residual bacteria might have been in the embolism he has also had source control now with valve replacement we will switch over to daptomycin  to complete 6 weeks of postoperative antibiotics  #2 AKI: Likely multifactorial as stated above we will be moving away from vancomycin .. Nephrology seeing him .  #3 Nausea and vomiting: Return to this query if he could have ileus or constipation driving this  CRITICAL CARE  Performed by: Jomarie Salinas Dam   Total critical care time: 31 minutes  Critical care time was exclusive of separately billable procedures and treating other patients.  Critical care was necessary to treat or prevent imminent or life-threatening deterioration.  Critical care was time spent personally by me on the following activities: development of treatment plan with patient and/or surrogate as well as nursing, discussions with consultants, evaluation of patient's response to treatment, examination of patient, obtaining history from patient or surrogate, ordering and performing treatments and interventions, ordering and review of laboratory studies, ordering and review of radiographic studies, pulse oximetry and re-evaluation of patient's condition.  My partner Dr. Dennise is elbow  for questions this weekend and will take over the service on Monday.   LOS: 13 days   Jomarie Salinas Rothman 06/28/2024, 1:42 PM

## 2024-06-28 NOTE — Consult Note (Signed)
 [facility]  INPATIENT CONSULTATION  Reason for Consultation: AKI Requesting Provider: Dr. Lucas  HPI: Kyle Matthews is an 78 y.o. male CAD s/p CABG 2024 + AoV replacement, HTN, HL, COPD, BPH s/p prostatectomy currently admitted with endocarditis s/p Bentall procedure, TV replacement, CABG on 7/9 now with AKI for which nephrology is consulted.   Presented 6/25 with sepsis, found to have MRSA bacteremia and endocartitis.  Underwent above surgery with prolonged CPB .  Post op he's developed oliguric AKI with Cr 0.9 on 7/9 pre op now 1.6 POD 1 and 2.8 today.  UOP 637 yesterday but has tapered off today to < 30mL.  This AM K 4.7, bicarb 21, BUN 29.  Hb 7-8s.  Has been hemodynamically fairly stable with no major hypotension and all pressors off as of this AM.  No contrast since 6/26.  Vanc level yesterday 38.   He's currently in ICU but as of ths AM off  IV vasopressors.  C/o expected post op pain.  +hiccups.  Per RN for removal of chest tube soon.  No major c/o.   PMH: Past Medical History:  Diagnosis Date   Anemia    Anxiety    Arthritis    BPH (benign prostatic hyperplasia)    Status post biopsy in 2009. - w/ LUTS   Coronary artery disease    a. s/p CABG in 10/2023 with LIMA-LAD and SVG-Ramus at the time of AVR   Depression    Essential hypertension    Hyperlipidemia    On Crestor  - followed by PCP   Moderate calcific aortic stenosis 04/2017   a. s/p AVR with 23 mm Edwards Inspiris Resilia pericardial valve in 10/2023   Stroke (HCC) 2007   No true etiology noted   UTI (urinary tract infection)    PSH: Past Surgical History:  Procedure Laterality Date   AORTIC VALVE REPLACEMENT N/A 11/02/2023   Procedure: AORTIC VALVE REPLACEMENT (AVR) USING 23 MM INSPIRIS RESILIA AORTIC VALVE;  Surgeon: Lucas Dorise POUR, MD;  Location: [facility] OR;  Service: Open Heart Surgery;  Laterality: N/A;   BENTALL PROCEDURE N/A 06/26/2024   Procedure: BENTALL PROCEDURE USING KONECT AORTIC VALVE  CONDUIT SIZE ;  Surgeon: Lucas Dorise POUR, MD;  Location: [facility] OR;  Service: Open Heart Surgery;  Laterality: N/A;   CORONARY ARTERY BYPASS GRAFT N/A 11/02/2023   Procedure: CORONARY ARTERY BYPASS GRAFTING (CABG) X TWO USING LEFT INTERNAL MAMMARY ARTERY AND RIGHT GREATER SAPHENEOUS VEIN HARVESTED ENDOSCOPICLY;  Surgeon: Lucas Dorise POUR, MD;  Location: [facility] OR;  Service: Open Heart Surgery;  Laterality: N/A;   CORONARY ARTERY BYPASS GRAFT N/A 06/26/2024   Procedure: CORONARY ARTERY BYPASS GRAFTING (CABG) TIMES ONE USING ENDOSCOPICALLY HARVESTED LEFT GREATER SAPHENOUS VEIN;  Surgeon: Lucas Dorise POUR, MD;  Location: [facility];  Service: Open Heart Surgery;  Laterality: N/A;   CORONARY PRESSURE/FFR STUDY N/A 10/09/2023   Procedure: CORONARY PRESSURE/FFR STUDY;  Surgeon: Wendel Lurena POUR, MD;  [facility]: [facility] INVASIVE CV LAB;  Service: Cardiovascular;  Laterality: N/A;   IR ANGIOGRAM PELVIS SELECTIVE OR SUPRASELECTIVE  10/27/2023   IR ANGIOGRAM PELVIS SELECTIVE OR SUPRASELECTIVE  10/27/2023   IR ANGIOGRAM SELECTIVE EACH ADDITIONAL VESSEL  10/27/2023   IR ANGIOGRAM SELECTIVE EACH ADDITIONAL VESSEL  10/27/2023   IR ANGIOGRAM SELECTIVE EACH ADDITIONAL VESSEL  10/27/2023   IR ANGIOGRAM SELECTIVE EACH ADDITIONAL VESSEL  10/27/2023   IR EMBO ARTERIAL NOT HEMORR HEMANG INC GUIDE ROADMAPPING  10/27/2023   IR US  GUIDE VASC ACCESS RIGHT  10/27/2023  REDO STERNOTOMY N/A 06/26/2024   Procedure: REDO STERNOTOMY;  Surgeon: Lucas Dorise POUR, MD;  [facility];  Service: Open Heart Surgery;  Laterality: N/A;   RIGHT HEART CATH AND CORONARY ANGIOGRAPHY N/A 06/20/2024   Procedure: RIGHT HEART CATH AND CORONARY ANGIOGRAPHY;  Surgeon: Ladona Heinz, MD;  [facility];  Service: Cardiovascular;  Laterality: N/A;   RIGHT/LEFT HEART CATH AND CORONARY ANGIOGRAPHY N/A 10/09/2023   Procedure: RIGHT/LEFT HEART CATH AND CORONARY ANGIOGRAPHY;  Surgeon: Wendel Lurena POUR, MD;  [facility];  Service: Cardiovascular;   Laterality: N/A;   TEE WITHOUT CARDIOVERSION N/A 11/02/2023   Procedure: TRANSESOPHAGEAL ECHOCARDIOGRAM;  Surgeon: Lucas Dorise POUR, MD;  Location: [facility] OR;  Service: Open Heart Surgery;  Laterality: N/A;   TEE WITHOUT CARDIOVERSION N/A 06/26/2024   Procedure: ECHOCARDIOGRAM, TRANSESOPHAGEAL;  Surgeon: Lucas Dorise POUR, MD;  [facility];  Service: Open Heart Surgery;  Laterality: N/A;   TRANSESOPHAGEAL ECHOCARDIOGRAM (CATH LAB) N/A 06/18/2024   Procedure: TRANSESOPHAGEAL ECHOCARDIOGRAM;  Surgeon: Jeffrie Oneil BROCKS, MD;  [facility];  Service: Cardiovascular;  Laterality: N/A;   TRANSTHORACIC ECHOCARDIOGRAM  04/28/2017    EF 65-70%. GR 1 DD. No regional wall motion abnormality. Moderate aortic stenosis with moderate regurgitation. Mean gradient 34 mmHg.   TRICUSPID VALVE REPLACEMENT N/A 06/26/2024   Procedure: TRICUSPID VALVE REPLACEMENT USING MOSIAC BRIOPROSTHESIS VALVE SIZE ;  Surgeon: Lucas Dorise POUR, MD;  Location: [facility] OR;  Service: Open Heart Surgery;  Laterality: N/A;   XI ROBOTIC ASSISTED SIMPLE PROSTATECTOMY N/A 05/22/2024   Procedure: PROSTATECTOMY, SIMPLE, ROBOT-ASSISTED;  Surgeon: Alvaro Ricardo KATHEE Mickey., MD;  Location: [facility] ORS;  Service: Urology;  Laterality: N/A;    Past Medical History:  Diagnosis Date   Anemia    Anxiety    Arthritis    BPH (benign prostatic hyperplasia)    Status post biopsy in 2009. - w/ LUTS   Coronary artery disease    a. s/p CABG in 10/2023 with LIMA-LAD and SVG-Ramus at the time of AVR   Depression    Essential hypertension    Hyperlipidemia    On Crestor  - followed by PCP   Moderate calcific aortic stenosis 04/2017   a. s/p AVR with 23 mm Edwards Inspiris Resilia pericardial valve in 10/2023   Stroke [facility]) 2007   No true etiology noted   UTI (urinary tract infection)     Medications:  I have reviewed the patient's current medications.  Medications Prior to Admission  Medication Sig Dispense Refill   amLODipine  (NORVASC ) 5 MG tablet  Take 1 tablet (5 mg total) by mouth daily. 30 tablet 0   aspirin  81 MG chewable tablet Chew 81 mg by mouth daily. Swallow whole.     ciprofloxacin  (CIPRO ) 500 MG tablet Take 500 mg by mouth 2 (two) times daily.     docusate sodium  (COLACE) 100 MG capsule Take 1 capsule (100 mg total) by mouth 2 (two) times daily. (Patient taking differently: Take 100 mg by mouth daily as needed for mild constipation.)     Ferrous Gluconate (IRON  27 PO) Take 27 mg by mouth daily.     finasteride  (PROSCAR ) 5 MG tablet Take 5 mg by mouth every evening.     furosemide  (LASIX ) 20 MG tablet TAKE 1 TABLET(20 MG) BY MOUTH DAILY AS NEEDED (Patient taking differently: Take 20 mg by mouth daily as needed for fluid or edema.) 30 tablet 8   nitroGLYCERIN  (NITROSTAT ) 0.4 MG SL tablet Place 1 tablet (  0.4 mg total) under the tongue every 5 (five) minutes as needed for chest pain. 25 tablet 3   rosuvastatin  (CRESTOR ) 40 MG tablet Take 40 mg by mouth daily.      ALLERGIES:   Allergies  Allergen Reactions   Tetanus Toxoids Other (See Comments)    Childhood Allergy    Zetia  [Ezetimibe ] Diarrhea    FAM HX: Family History  Problem Relation Age of Onset   Coronary artery disease Mother 31       75. Was diagnosed with a blood clot - suspect that this was a STEMI   Lung cancer Father    Thyroid  disease Sister    Healthy Brother 40   Pancreatic cancer Sister 72   Cancer Maternal Grandmother    Heart disease Maternal Grandfather 9   Cancer Paternal Grandmother    Stroke Paternal Grandfather     Social History:   reports that he has been smoking cigarettes. He has a 36 pack-year smoking history. He has never used smokeless tobacco. He reports that he does not currently use alcohol after a past usage of about 6.0 standard drinks of alcohol per week. He reports that he does not use drugs.  ROS: 12 system ROS neg except per HPI above  Blood pressure 110/71, pulse 89, temperature 98.4 F (36.9 C), resp. rate 13, height (P)  5' 2 (1.575 m), weight 73.3 kg, SpO2 96%. PHYSICAL EXAM: Gen: ill but nontoxic appearing  Eyes: EOMI ENT: MM tacky mouth breathing CV: paced on monitor, temp pacer RIJ central  line Abd:  soft, mildly distended Lungs: normal WOB on 2L [place], dec BS bases GU: foley with scant amber urine Extr: 1+ diffuse edema, TED hose on Neuro: conversant, nonfocal   Results for orders placed or performed during the hospital encounter of 06/12/24 (from the past 48 hours)  I-STAT, chem 8     Status: Abnormal   Collection Time: 06/26/24 12:00 PM  Result Value Ref Range   Sodium 134 (L) 135 - 145 mmol/L   Potassium 4.2 3.5 - 5.1 mmol/L   Chloride 102 98 - 111 mmol/L   BUN 12 8 - 23 mg/dL   Creatinine, Ser 8.99 0.61 - 1.24 mg/dL   Glucose, Bld 861 (H) 70 - 99 mg/dL    Comment: Glucose reference range applies only to samples taken after fasting for at least 8 hours.   Calcium , Ion 1.14 (L) 1.15 - 1.40 mmol/L   TCO2 24 22 - 32 mmol/L   Hemoglobin 7.8 (L) 13.0 - 17.0 g/dL   HCT 76.9 (L) 60.9 - 47.9 %  I-STAT 7, (LYTES, BLD GAS, ICA, H+H)     Status: Abnormal   Collection Time: 06/26/24 12:36 PM  Result Value Ref Range   pH, Arterial 7.413 7.35 - 7.45   pCO2 arterial 36.0 32 - 48 mmHg   pO2, Arterial 263 (H) 83 - 108 mmHg   Bicarbonate 22.9 20.0 - 28.0 mmol/L   TCO2 24 22 - 32 mmol/L   O2 Saturation 100 %   Acid-base deficit 2.0 0.0 - 2.0 mmol/L   Sodium 134 (L) 135 - 145 mmol/L   Potassium 4.0 3.5 - 5.1 mmol/L   Calcium , Ion 1.09 (L) 1.15 - 1.40 mmol/L   HCT 20.0 (L) 39.0 - 52.0 %   Hemoglobin 6.8 (LL) 13.0 - 17.0 g/dL   Sample type ARTERIAL   I-STAT, chem 8     Status: Abnormal   Collection Time: 06/26/24  1:04 PM  Result  Value Ref Range   Sodium 133 (L) 135 - 145 mmol/L   Potassium 4.2 3.5 - 5.1 mmol/L   Chloride 99 98 - 111 mmol/L   BUN 12 8 - 23 mg/dL   Creatinine, Ser 8.99 0.61 - 1.24 mg/dL   Glucose, Bld 854 (H) 70 - 99 mg/dL    Comment: Glucose reference range applies only to  samples taken after fasting for at least 8 hours.   Calcium , Ion 1.09 (L) 1.15 - 1.40 mmol/L   TCO2 25 22 - 32 mmol/L   Hemoglobin 7.5 (L) 13.0 - 17.0 g/dL   HCT 77.9 (L) 60.9 - 47.9 %  I-STAT 7, (LYTES, BLD GAS, ICA, H+H)     Status: Abnormal   Collection Time: 06/26/24  1:08 PM  Result Value Ref Range   pH, Arterial 7.356 7.35 - 7.45   pCO2 arterial 40.0 32 - 48 mmHg   pO2, Arterial 260 (H) 83 - 108 mmHg   Bicarbonate 22.4 20.0 - 28.0 mmol/L   TCO2 24 22 - 32 mmol/L   O2 Saturation 100 %   Acid-base deficit 3.0 (H) 0.0 - 2.0 mmol/L   Sodium 132 (L) 135 - 145 mmol/L   Potassium 4.2 3.5 - 5.1 mmol/L   Calcium , Ion 1.10 (L) 1.15 - 1.40 mmol/L   HCT 26.0 (L) 39.0 - 52.0 %   Hemoglobin 8.8 (L) 13.0 - 17.0 g/dL   Sample type ARTERIAL   I-STAT 7, (LYTES, BLD GAS, ICA, H+H)     Status: Abnormal   Collection Time: 06/26/24  1:31 PM  Result Value Ref Range   pH, Arterial 7.350 7.35 - 7.45   pCO2 arterial 44.0 32 - 48 mmHg   pO2, Arterial 255 (H) 83 - 108 mmHg   Bicarbonate 24.3 20.0 - 28.0 mmol/L   TCO2 26 22 - 32 mmol/L   O2 Saturation 100 %   Acid-base deficit 1.0 0.0 - 2.0 mmol/L   Sodium 133 (L) 135 - 145 mmol/L   Potassium 4.5 3.5 - 5.1 mmol/L   Calcium , Ion 1.12 (L) 1.15 - 1.40 mmol/L   HCT 22.0 (L) 39.0 - 52.0 %   Hemoglobin 7.5 (L) 13.0 - 17.0 g/dL   Sample type ARTERIAL   I-STAT, chem 8     Status: Abnormal   Collection Time: 06/26/24  2:03 PM  Result Value Ref Range   Sodium 133 (L) 135 - 145 mmol/L   Potassium 4.1 3.5 - 5.1 mmol/L   Chloride 100 98 - 111 mmol/L   BUN 13 8 - 23 mg/dL   Creatinine, Ser 8.99 0.61 - 1.24 mg/dL   Glucose, Bld 862 (H) 70 - 99 mg/dL    Comment: Glucose reference range applies only to samples taken after fasting for at least 8 hours.   Calcium , Ion 1.12 (L) 1.15 - 1.40 mmol/L   TCO2 24 22 - 32 mmol/L   Hemoglobin 7.8 (L) 13.0 - 17.0 g/dL   HCT 76.9 (L) 60.9 - 47.9 %  I-STAT 7, (LYTES, BLD GAS, ICA, H+H)     Status: Abnormal   Collection  Time: 06/26/24  2:32 PM  Result Value Ref Range   pH, Arterial 7.400 7.35 - 7.45   pCO2 arterial 42.6 32 - 48 mmHg   pO2, Arterial 255 (H) 83 - 108 mmHg   Bicarbonate 26.4 20.0 - 28.0 mmol/L   TCO2 28 22 - 32 mmol/L   O2 Saturation 100 %   Acid-Base Excess 1.0 0.0 - 2.0 mmol/L  Sodium 135 135 - 145 mmol/L   Potassium 4.0 3.5 - 5.1 mmol/L   Calcium , Ion 1.05 (L) 1.15 - 1.40 mmol/L   HCT 20.0 (L) 39.0 - 52.0 %   Hemoglobin 6.8 (LL) 13.0 - 17.0 g/dL   Sample type ARTERIAL   Aerobic/Anaerobic Culture w Gram Stain (surgical/deep wound)     Status: None (Preliminary result)   Collection Time: 06/26/24  2:48 PM   Specimen: Path Tissue  Result Value Ref Range   Specimen Description TISSUE    Special Requests TRICUSPID VALVE    Gram Stain NO WBC SEEN NO ORGANISMS SEEN     Culture      NO GROWTH 2 DAYS NO ANAEROBES ISOLATED; CULTURE IN PROGRESS FOR 5 DAYS Performed at [facility], [address] [place], [place] [address]    Report Status PENDING   Acid Fast Smear (AFB)     Status: None   Collection Time: 06/26/24  2:48 PM   Specimen: Path Tissue  Result Value Ref Range   AFB Specimen Processing Comment     Comment: Tissue Grinding and Digestion/Decontamination   Acid Fast Smear Negative     Comment: (NOTE) Performed At: [facility] [address] [place], [place] [id number] Jennette Shorter MD Ey:[id number]    Source (AFB) TRICUSPID VALVE     Comment: Performed at [facility], [address], [place], [place] [address]  I-STAT, nathanael 8     Status: Abnormal   Collection Time: 06/26/24  3:09 PM  Result Value Ref Range   Sodium 133 (L) 135 - 145 mmol/L   Potassium 4.2 3.5 - 5.1 mmol/L   Chloride 99 98 - 111 mmol/L   BUN 14 8 - 23 mg/dL   Creatinine, Ser 8.99 0.61 - 1.24 mg/dL   Glucose, Bld 868 (H) 70 - 99 mg/dL    Comment: Glucose reference range applies only to samples taken after fasting for at least 8 hours.   Calcium , Ion 1.10 (L) 1.15 - 1.40 mmol/L    TCO2 26 22 - 32 mmol/L   Hemoglobin 7.1 (L) 13.0 - 17.0 g/dL   HCT 78.9 (L) 60.9 - 47.9 %  I-STAT 7, (LYTES, BLD GAS, ICA, H+H)     Status: Abnormal   Collection Time: 06/26/24  3:30 PM  Result Value Ref Range   pH, Arterial 7.372 7.35 - 7.45   pCO2 arterial 42.0 32 - 48 mmHg   pO2, Arterial 279 (H) 83 - 108 mmHg   Bicarbonate 24.4 20.0 - 28.0 mmol/L   TCO2 26 22 - 32 mmol/L   O2 Saturation 100 %   Acid-base deficit 1.0 0.0 - 2.0 mmol/L   Sodium 133 (L) 135 - 145 mmol/L   Potassium 4.4 3.5 - 5.1 mmol/L   Calcium , Ion 1.04 (L) 1.15 - 1.40 mmol/L   HCT 24.0 (L) 39.0 - 52.0 %   Hemoglobin 8.2 (L) 13.0 - 17.0 g/dL   Sample type ARTERIAL   Fibrinogen      Status: None   Collection Time: 06/26/24  3:53 PM  Result Value Ref Range   Fibrinogen  238 210 - 475 mg/dL    Comment: (NOTE) Fibrinogen  results may be underestimated in patients receiving thrombolytic therapy. Performed at [facility], [address], [place], [place] [address]   Hemoglobin and hematocrit, blood     Status: Abnormal   Collection Time: 06/26/24  3:53 PM  Result Value Ref Range   Hemoglobin 8.0 (L) 13.0 -  17.0 g/dL    Comment: RESULT CALLED TO, READ BACK BY AND VERIFIED WITH: J SUI,RN 1626 06/26/2024 WBOND    HCT 24.0 (L) 39.0 - 52.0 %    Comment: Performed at [facility], [address], [place], [place] [address]  Platelet count     Status: Abnormal   Collection Time: 06/26/24  3:53 PM  Result Value Ref Range   Platelets 124 (L) 150 - 400 K/uL    Comment: Performed at [facility], [address] [place], [place] [address]  I-STAT, nathanael 8     Status: Abnormal   Collection Time: 06/26/24  3:58 PM  Result Value Ref Range   Sodium 133 (L) 135 - 145 mmol/L   Potassium 4.5 3.5 - 5.1 mmol/L   Chloride 100 98 - 111 mmol/L   BUN 13 8 - 23 mg/dL   Creatinine, Ser 9.09 0.61 - 1.24 mg/dL   Glucose, Bld 848 (H) 70 - 99 mg/dL    Comment: Glucose reference range applies only to samples taken  after fasting for at least 8 hours.   Calcium , Ion 1.05 (L) 1.15 - 1.40 mmol/L   TCO2 25 22 - 32 mmol/L   Hemoglobin 8.5 (L) 13.0 - 17.0 g/dL   HCT 74.9 (L) 60.9 - 47.9 %  Prepare cryoprecipitate     Status: None   Collection Time: 06/26/24  4:15 PM  Result Value Ref Range   Unit Number [id number]    Blood Component Type POOL FIBR CMPLX 2D THW    Unit division 00    Status of Unit ISSUED,FINAL    Transfusion Status OK TO TRANSFUSE    Unit Number [id number]    Blood Component Type POOL FIBR CMPLX 2D THW    Unit division 00    Status of Unit ISSUED,FINAL    Transfusion Status      OK TO TRANSFUSE Performed at [facility], [address] [place], [place] [address]   Prepare fresh frozen plasma     Status: None   Collection Time: 06/26/24  4:16 PM  Result Value Ref Range   Unit Number [id number]    Blood Component Type THAWED PLASMA    Unit division 00    Status of Unit ISSUED,FINAL    Transfusion Status      OK TO TRANSFUSE Performed at [facility], [address], [place], [place] [address]    Unit Number [id number]    Blood Component Type THW PLS APHR    Unit division 00    Status of Unit ISSUED,FINAL    Transfusion Status OK TO TRANSFUSE   Prepare platelet pheresis     Status: None   Collection Time: 06/26/24  4:16 PM  Result Value Ref Range   Unit Number [id number]    Blood Component Type PLTP2 PSORALEN TREATED    Unit division 00    Status of Unit ISSUED,FINAL    Transfusion Status OK TO TRANSFUSE    Unit Number [id number]    Blood Component Type PLTPH LR1 7D    Unit division 00    Status of Unit ISSUED,FINAL    Transfusion Status      OK TO TRANSFUSE Performed at [facility], [address], [place], [place] [address]   Fibrinogen      Status: None   Collection Time: 06/26/24  4:23 PM  Result Value Ref Range   Fibrinogen  233 210 - 475 mg/dL  Comment: (NOTE) Fibrinogen  results may be underestimated in patients  receiving thrombolytic therapy. Performed at [facility], [address] [place], [place] [address]   Platelet count     Status: Abnormal   Collection Time: 06/26/24  4:23 PM  Result Value Ref Range   Platelets 119 (L) 150 - 400 K/uL    Comment: Performed at [facility], [address], [place], [place] [address]  Hemoglobin and hematocrit, blood     Status: Abnormal   Collection Time: 06/26/24  4:23 PM  Result Value Ref Range   Hemoglobin 8.0 (L) 13.0 - 17.0 g/dL    Comment: RESULT CALLED TO, READ BACK BY AND VERIFIED WITH: J SUI,RN 1656 06/26/2024 WBOND    HCT 23.7 (L) 39.0 - 52.0 %    Comment: Performed at [facility], [address], [place], [place] [address]  I-STAT 7, (LYTES, BLD GAS, ICA, H+H)     Status: Abnormal   Collection Time: 06/26/24  5:06 PM  Result Value Ref Range   pH, Arterial 7.279 (L) 7.35 - 7.45   pCO2 arterial 55.7 (H) 32 - 48 mmHg   pO2, Arterial 74 (L) 83 - 108 mmHg   Bicarbonate 26.1 20.0 - 28.0 mmol/L   TCO2 28 22 - 32 mmol/L   O2 Saturation 92 %   Acid-base deficit 1.0 0.0 - 2.0 mmol/L   Sodium 134 (L) 135 - 145 mmol/L   Potassium 4.8 3.5 - 5.1 mmol/L   Calcium , Ion 1.29 1.15 - 1.40 mmol/L   HCT 26.0 (L) 39.0 - 52.0 %   Hemoglobin 8.8 (L) 13.0 - 17.0 g/dL   Sample type ARTERIAL   I-STAT, chem 8     Status: Abnormal   Collection Time: 06/26/24  5:12 PM  Result Value Ref Range   Sodium 134 (L) 135 - 145 mmol/L   Potassium 4.8 3.5 - 5.1 mmol/L   Chloride 101 98 - 111 mmol/L   BUN 18 8 - 23 mg/dL   Creatinine, Ser 8.89 0.61 - 1.24 mg/dL   Glucose, Bld 873 (H) 70 - 99 mg/dL    Comment: Glucose reference range applies only to samples taken after fasting for at least 8 hours.   Calcium , Ion 1.27 1.15 - 1.40 mmol/L   TCO2 25 22 - 32 mmol/L   Hemoglobin 9.9 (L) 13.0 - 17.0 g/dL   HCT 70.9 (L) 60.9 - 47.9 %  I-STAT 7, (LYTES, BLD GAS, ICA, H+H)     Status: Abnormal   Collection Time: 06/26/24  5:56 PM  Result Value Ref Range    pH, Arterial 7.305 (L) 7.35 - 7.45   pCO2 arterial 51.8 (H) 32 - 48 mmHg   pO2, Arterial 197 (H) 83 - 108 mmHg   Bicarbonate 25.8 20.0 - 28.0 mmol/L   TCO2 27 22 - 32 mmol/L   O2 Saturation 100 %   Acid-Base Excess 0.0 0.0 - 2.0 mmol/L   Sodium 136 135 - 145 mmol/L   Potassium 4.1 3.5 - 5.1 mmol/L   Calcium , Ion 1.13 (L) 1.15 - 1.40 mmol/L   HCT 20.0 (L) 39.0 - 52.0 %   Hemoglobin 6.8 (LL) 13.0 - 17.0 g/dL   Sample type ARTERIAL   Glucose, capillary     Status: Abnormal   Collection Time: 06/26/24  7:46 PM  Result Value Ref Range   Glucose-Capillary 119 (H) 70 - 99 mg/dL    Comment: Glucose reference range applies only to samples taken  after fasting for at least 8 hours.  I-STAT 7, (LYTES, BLD GAS, ICA, H+H)     Status: Abnormal   Collection Time: 06/26/24  7:49 PM  Result Value Ref Range   pH, Arterial 7.341 (L) 7.35 - 7.45   pCO2 arterial 45.4 32 - 48 mmHg   pO2, Arterial 92 83 - 108 mmHg   Bicarbonate 24.8 20.0 - 28.0 mmol/L   TCO2 26 22 - 32 mmol/L   O2 Saturation 97 %   Acid-base deficit 1.0 0.0 - 2.0 mmol/L   Sodium 137 135 - 145 mmol/L   Potassium 4.5 3.5 - 5.1 mmol/L   Calcium , Ion 1.34 1.15 - 1.40 mmol/L   HCT 27.0 (L) 39.0 - 52.0 %   Hemoglobin 9.2 (L) 13.0 - 17.0 g/dL   Patient temperature 63.6 C    Collection site art line    Drawn by Nurse    Sample type ARTERIAL   CBC     Status: Abnormal   Collection Time: 06/26/24  7:51 PM  Result Value Ref Range   WBC 15.7 (H) 4.0 - 10.5 K/uL   RBC 3.18 (L) 4.22 - 5.81 MIL/uL   Hemoglobin 9.5 (L) 13.0 - 17.0 g/dL   HCT 71.7 (L) 60.9 - 47.9 %   MCV 88.7 80.0 - 100.0 fL   MCH 29.9 26.0 - 34.0 pg   MCHC 33.7 30.0 - 36.0 g/dL   RDW 84.3 (H) 88.4 - 84.4 %   Platelets 145 (L) 150 - 400 K/uL   nRBC 0.0 0.0 - 0.2 %    Comment: Performed at [facility], [address] [place], [place] [address]  Protime-INR     Status: None   Collection Time: 06/26/24  7:51 PM  Result Value Ref Range   Prothrombin Time 12.1  11.4 - 15.2 seconds   INR 0.9 0.8 - 1.2    Comment: (NOTE) INR goal varies based on device and disease states. Performed at [facility], [address], [place], [place] [address]   APTT     Status: Abnormal   Collection Time: 06/26/24  7:51 PM  Result Value Ref Range   aPTT 47 (H) 24 - 36 seconds    Comment:        IF BASELINE aPTT IS ELEVATED, SUGGEST PATIENT RISK ASSESSMENT BE USED TO DETERMINE APPROPRIATE ANTICOAGULANT THERAPY. Performed at [facility], [address] [place], [place] [address]   Glucose, capillary     Status: Abnormal   Collection Time: 06/26/24  9:34 PM  Result Value Ref Range   Glucose-Capillary 143 (H) 70 - 99 mg/dL    Comment: Glucose reference range applies only to samples taken after fasting for at least 8 hours.  Glucose, capillary     Status: Abnormal   Collection Time: 06/26/24 11:31 PM  Result Value Ref Range   Glucose-Capillary 104 (H) 70 - 99 mg/dL    Comment: Glucose reference range applies only to samples taken after fasting for at least 8 hours.  Glucose, capillary     Status: Abnormal   Collection Time: 06/27/24 12:31 AM  Result Value Ref Range   Glucose-Capillary 118 (H) 70 - 99 mg/dL    Comment: Glucose reference range applies only to samples taken after fasting for at least 8 hours.  Glucose, capillary     Status: Abnormal   Collection Time: 06/27/24  1:31 AM  Result Value Ref Range   Glucose-Capillary 108 (H) 70 - 99  mg/dL    Comment: Glucose reference range applies only to samples taken after fasting for at least 8 hours.  Glucose, capillary     Status: Abnormal   Collection Time: 06/27/24  2:37 AM  Result Value Ref Range   Glucose-Capillary 109 (H) 70 - 99 mg/dL    Comment: Glucose reference range applies only to samples taken after fasting for at least 8 hours.  CBC     Status: Abnormal   Collection Time: 06/27/24  4:27 AM  Result Value Ref Range   WBC 17.6 (H) 4.0 - 10.5 K/uL   RBC 3.21 (L) 4.22 - 5.81 MIL/uL    Hemoglobin 9.7 (L) 13.0 - 17.0 g/dL   HCT 71.3 (L) 60.9 - 47.9 %   MCV 89.1 80.0 - 100.0 fL   MCH 30.2 26.0 - 34.0 pg   MCHC 33.9 30.0 - 36.0 g/dL   RDW 84.2 (H) 88.4 - 84.4 %   Platelets 175 150 - 400 K/uL   nRBC 0.0 0.0 - 0.2 %    Comment: Performed at [facility], [address] [place], [place] [address]  Basic metabolic panel     Status: Abnormal   Collection Time: 06/27/24  4:27 AM  Result Value Ref Range   Sodium 137 135 - 145 mmol/L   Potassium 5.5 (H) 3.5 - 5.1 mmol/L   Chloride 107 98 - 111 mmol/L   CO2 20 (L) 22 - 32 mmol/L   Glucose, Bld 123 (H) 70 - 99 mg/dL    Comment: Glucose reference range applies only to samples taken after fasting for at least 8 hours.   BUN 17 8 - 23 mg/dL   Creatinine, Ser 8.40 (H) 0.61 - 1.24 mg/dL   Calcium  8.4 (L) 8.9 - 10.3 mg/dL   GFR, Estimated 45 (L) >60 mL/min    Comment: (NOTE) Calculated using the CKD-EPI Creatinine Equation (2021)    Anion gap 10 5 - 15    Comment: Performed at [facility], [address], [place], [place] [address]  Magnesium      Status: Abnormal   Collection Time: 06/27/24  4:27 AM  Result Value Ref Range   Magnesium  4.1 (H) 1.7 - 2.4 mg/dL    Comment: Performed at [facility], [address] [place], [place] [address]  Glucose, capillary     Status: Abnormal   Collection Time: 06/27/24  4:28 AM  Result Value Ref Range   Glucose-Capillary 118 (H) 70 - 99 mg/dL    Comment: Glucose reference range applies only to samples taken after fasting for at least 8 hours.  Glucose, capillary     Status: None   Collection Time: 06/27/24  7:12 AM  Result Value Ref Range   Glucose-Capillary 77 70 - 99 mg/dL    Comment: Glucose reference range applies only to samples taken after fasting for at least 8 hours.  I-STAT 7, (LYTES, BLD GAS, ICA, H+H)     Status: Abnormal   Collection Time: 06/27/24  8:43 AM  Result Value Ref Range   pH, Arterial 7.327 (L) 7.35 - 7.45   pCO2 arterial 36.5 32 - 48  mmHg   pO2, Arterial 109 (H) 83 - 108 mmHg   Bicarbonate 19.1 (L) 20.0 - 28.0 mmol/L   TCO2 20 (L) 22 - 32 mmol/L   O2 Saturation 98 %   Acid-base deficit 6.0 (H) 0.0 - 2.0 mmol/L   Sodium 138 135 - 145 mmol/L   Potassium  5.0 3.5 - 5.1 mmol/L   Calcium , Ion 1.20 1.15 - 1.40 mmol/L   HCT 24.0 (L) 39.0 - 52.0 %   Hemoglobin 8.2 (L) 13.0 - 17.0 g/dL   Patient temperature 62.8 C    Sample type ARTERIAL   Vancomycin , trough     Status: Abnormal   Collection Time: 06/27/24  9:39 AM  Result Value Ref Range   Vancomycin  Tr 38 (HH) 15 - 20 ug/mL    Comment: CRITICAL RESULT CALLED TO, READ BACK BY AND VERIFIED WITH JOHNIE BRAVO RN [id number] 06/27/24 AMIREHSANIF Performed at [facility], [address] [place], [place] [address]   I-STAT 7, (LYTES, BLD GAS, ICA, H+H)     Status: Abnormal   Collection Time: 06/27/24 10:17 AM  Result Value Ref Range   pH, Arterial 7.366 7.35 - 7.45   pCO2 arterial 31.7 (L) 32 - 48 mmHg   pO2, Arterial 60 (L) 83 - 108 mmHg   Bicarbonate 18.3 (L) 20.0 - 28.0 mmol/L   TCO2 19 (L) 22 - 32 mmol/L   O2 Saturation 91 %   Acid-base deficit 7.0 (H) 0.0 - 2.0 mmol/L   Sodium 141 135 - 145 mmol/L   Potassium 4.5 3.5 - 5.1 mmol/L   Calcium , Ion 1.15 1.15 - 1.40 mmol/L   HCT 22.0 (L) 39.0 - 52.0 %   Hemoglobin 7.5 (L) 13.0 - 17.0 g/dL   Patient temperature 02.1 F    Sample type ARTERIAL   Glucose, capillary     Status: None   Collection Time: 06/27/24 11:18 AM  Result Value Ref Range   Glucose-Capillary 97 70 - 99 mg/dL    Comment: Glucose reference range applies only to samples taken after fasting for at least 8 hours.  I-STAT 7, (LYTES, BLD GAS, ICA, H+H)     Status: Abnormal   Collection Time: 06/27/24 12:59 PM  Result Value Ref Range   pH, Arterial 7.361 7.35 - 7.45   pCO2 arterial 40.4 32 - 48 mmHg   pO2, Arterial 64 (L) 83 - 108 mmHg   Bicarbonate 22.9 20.0 - 28.0 mmol/L   TCO2 24 22 - 32 mmol/L   O2 Saturation 92 %   Acid-base deficit 2.0 0.0 - 2.0  mmol/L   Sodium 139 135 - 145 mmol/L   Potassium 4.7 3.5 - 5.1 mmol/L   Calcium , Ion 1.16 1.15 - 1.40 mmol/L   HCT 23.0 (L) 39.0 - 52.0 %   Hemoglobin 7.8 (L) 13.0 - 17.0 g/dL   Patient temperature 63.1 C    Sample type ARTERIAL   Glucose, capillary     Status: Abnormal   Collection Time: 06/27/24  4:31 PM  Result Value Ref Range   Glucose-Capillary 123 (H) 70 - 99 mg/dL    Comment: Glucose reference range applies only to samples taken after fasting for at least 8 hours.  Basic metabolic panel     Status: Abnormal   Collection Time: 06/27/24  4:52 PM  Result Value Ref Range   Sodium 138 135 - 145 mmol/L   Potassium 4.7 3.5 - 5.1 mmol/L   Chloride 106 98 - 111 mmol/L   CO2 21 (L) 22 - 32 mmol/L   Glucose, Bld 130 (H) 70 - 99 mg/dL    Comment: Glucose reference range applies only to samples taken after fasting for at least 8 hours.   BUN 22 8 - 23 mg/dL   Creatinine, Ser 7.97 (H) 0.61 - 1.24 mg/dL   Calcium  8.0 (  L) 8.9 - 10.3 mg/dL   GFR, Estimated 34 (L) >60 mL/min    Comment: (NOTE) Calculated using the CKD-EPI Creatinine Equation (2021)    Anion gap 11 5 - 15    Comment: Performed at [facility], [address], [place], [place] [address]  Magnesium      Status: Abnormal   Collection Time: 06/27/24  4:52 PM  Result Value Ref Range   Magnesium  3.6 (H) 1.7 - 2.4 mg/dL    Comment: Performed at [facility], [address] [place], [place] [address]  CBC     Status: Abnormal   Collection Time: 06/27/24  4:52 PM  Result Value Ref Range   WBC 17.3 (H) 4.0 - 10.5 K/uL   RBC 2.80 (L) 4.22 - 5.81 MIL/uL   Hemoglobin 8.3 (L) 13.0 - 17.0 g/dL   HCT 74.8 (L) 60.9 - 47.9 %   MCV 89.6 80.0 - 100.0 fL   MCH 29.6 26.0 - 34.0 pg   MCHC 33.1 30.0 - 36.0 g/dL   RDW 84.0 (H) 88.4 - 84.4 %   Platelets 141 (L) 150 - 400 K/uL   nRBC 0.0 0.0 - 0.2 %    Comment: Performed at [facility], [address], [place], [place] [address]  Glucose, capillary     Status: Abnormal    Collection Time: 06/27/24  8:20 PM  Result Value Ref Range   Glucose-Capillary 150 (H) 70 - 99 mg/dL    Comment: Glucose reference range applies only to samples taken after fasting for at least 8 hours.  Glucose, capillary     Status: Abnormal   Collection Time: 06/27/24 11:56 PM  Result Value Ref Range   Glucose-Capillary 125 (H) 70 - 99 mg/dL    Comment: Glucose reference range applies only to samples taken after fasting for at least 8 hours.  Glucose, capillary     Status: Abnormal   Collection Time: 06/28/24  3:43 AM  Result Value Ref Range   Glucose-Capillary 124 (H) 70 - 99 mg/dL    Comment: Glucose reference range applies only to samples taken after fasting for at least 8 hours.  CBC     Status: Abnormal   Collection Time: 06/28/24  4:46 AM  Result Value Ref Range   WBC 17.4 (H) 4.0 - 10.5 K/uL   RBC 2.64 (L) 4.22 - 5.81 MIL/uL   Hemoglobin 7.9 (L) 13.0 - 17.0 g/dL   HCT 76.0 (L) 60.9 - 47.9 %   MCV 90.5 80.0 - 100.0 fL   MCH 29.9 26.0 - 34.0 pg   MCHC 33.1 30.0 - 36.0 g/dL   RDW 83.6 (H) 88.4 - 84.4 %   Platelets 150 150 - 400 K/uL   nRBC 0.0 0.0 - 0.2 %    Comment: Performed at [facility], [address] [place], [place] [address]  Basic metabolic panel with GFR     Status: Abnormal   Collection Time: 06/28/24  4:46 AM  Result Value Ref Range   Sodium 136 135 - 145 mmol/L   Potassium 4.7 3.5 - 5.1 mmol/L   Chloride 103 98 - 111 mmol/L   CO2 21 (L) 22 - 32 mmol/L   Glucose, Bld 116 (H) 70 - 99 mg/dL    Comment: Glucose reference range applies only to samples taken after fasting for at least 8 hours.   BUN 29 (H) 8 - 23 mg/dL   Creatinine, Ser 7.15 (H) 0.61 - 1.24  mg/dL   Calcium  7.7 (L) 8.9 - 10.3 mg/dL   GFR, Estimated 22 (L) >60 mL/min    Comment: (NOTE) Calculated using the CKD-EPI Creatinine Equation (2021)    Anion gap 12 5 - 15    Comment: Performed at [facility], [address], [place], [place] [address]  Glucose, capillary     Status:  Abnormal   Collection Time: 06/28/24  7:33 AM  Result Value Ref Range   Glucose-Capillary 157 (H) 70 - 99 mg/dL    Comment: Glucose reference range applies only to samples taken after fasting for at least 8 hours.  Cooxemetry Panel (carboxy, met, total hgb, O2 sat)     Status: Abnormal   Collection Time: 06/28/24  9:16 AM  Result Value Ref Range   Total hemoglobin 8.1 (L) 12.0 - 16.0 g/dL   O2 Saturation 42.2 %   Carboxyhemoglobin 1.4 0.5 - 1.5 %   Methemoglobin 1.4 0.0 - 1.5 %    Comment: Performed at [facility], [address], [place], [place] [address]  Glucose, capillary     Status: Abnormal   Collection Time: 06/28/24 11:06 AM  Result Value Ref Range   Glucose-Capillary 122 (H) 70 - 99 mg/dL    Comment: Glucose reference range applies only to samples taken after fasting for at least 8 hours.    DG CHEST PORT 1 VIEW Result Date: 06/28/2024 CLINICAL DATA:  Endocarditis EXAM: PORTABLE CHEST 1 VIEW COMPARISON:  06/27/2024 FINDINGS: Interval extubation. No increased atelectasis. Bilateral chest tubes remain. No pneumothorax. Mediastinal drain remains. Interval removal of NG tube. No pulmonary edema IMPRESSION: 1. Interval extubation and removal of NG tube. 2. No increase in atelectasis. 3. Bilateral chest tubes.  No pneumothorax. Electronically Signed   By: Jackquline Boxer M.D.   On: 06/28/2024 08:40   DG Chest Port 1 View Result Date: 06/27/2024 CLINICAL DATA:  Heard valve replacement EXAM: PORTABLE CHEST 1 VIEW COMPARISON:  06/26/2024 FINDINGS: Endotracheal tube, NG 2 mediastinal drain are unchanged. Central venous line with tip the RIGHT atrium. Bilateral chest tubes. Again demonstrated probable small LEFT apical pneumothorax thickened pleura. Some improvement aeration lung bases. No pulmonary edema. IMPRESSION: 1. Stable support apparatus. 2. Central venous line in RIGHT atrium. 3. Stable small LEFT apical pneumothorax. 4. Improved aeration lung bases. Electronically Signed    By: Jackquline Boxer M.D.   On: 06/27/2024 08:32   DG Chest Port 1 View Result Date: 06/26/2024 CLINICAL DATA:  Status post aortic valve replacement EXAM: PORTABLE CHEST 1 VIEW COMPARISON:  06/12/2024 FINDINGS: Interval intubation, tip of the endotracheal tube is about 4.2 cm superior to the carina. Enteric tube tip and side port at the gastric fundus. Right IJ central venous catheter with tip at the low right atrium. Sternotomy and aortic valve prosthesis. Interim placement of mediastinal and bilateral chest drainage catheters. Mild cardiomegaly. Small bilateral pleural effusions. Bandlike areas of atelectasis in the mid and lower lungs. Possible small left apical pneumothorax. IMPRESSION: 1. Interval intubation, placement of mediastinal and bilateral chest drainage catheters, support lines and tubes as above. Possible small left apical pneumothorax. 2. Mild cardiomegaly with small bilateral pleural effusions with bandlike areas of atelectasis in the mid and lower lungs. Electronically Signed   By: Luke Bun M.D.   On: 06/26/2024 20:04    Assessment/Plan Welles Walthall is an 78 y.o. male CAD s/p CABG 2024 + AoV replacement, HTN, HL, COPD, BPH s/p prostatectomy currently admitted with endocarditis s/p Bentall procedure, TV  replacement, CABG on 7/9 now with AKI.  **AKI, oliguric: normal baseline now with oliguric AKI in the setting of hemodynamic insults during lengthy surgery, poss contribution of high vanc level. Foley  UA with blood, do not suspect GN.  No current indications for RRT.  Will give lasix  80 IV challenge today.  Serial labs, strict I/Os, dose meds for crcl, avoid nephrotoxins and hypotension.  HD if develops indications - he is agreeable If needed.  Informed need may arise in next 24-48h.    **disseminated MRSA:  endocarditis, CNS septic embolism:  ID following, vancomycin  preferred due to CNS penetration.  Level currently 38.  If vancomycin  highly preferred due to CNS penetration I do not  have a problem continuing therapy.  I think that's a minor if at all contributor to AKI.  If continues on vanc would target lowest therapeutic level.   **Anemia:  Hb in the 7-8s, transfusion per primary PRN.   **s/p AoV and TV replacements 7/9  **CHB: EP consulting today, currently paced  Will follow, reach out with concerns.    Manuelita DELENA Barters 06/28/2024, 11:43 AM  Total critical care time:    Critical care time was exclusive of treating other patients.   Critical care was necessary to treat or prevent imminent or life-threatening deterioration. Critical care was time spent personally by me on the following activities:  development of treatment plan with patient and/or surrogate as well as nursing, discussions with other provider, evaluation of patient's response to treatment, examination of patient, obtaining history from patient or surrogate, ordering and performing treatments and interventions, ordering and review of laboratory studies, review of radiographic studies

## 2024-06-28 NOTE — Plan of Care (Signed)
  Problem: Pain Management: Goal: General experience of comfort will improve Outcome: Progressing   Problem: Urinary Elimination: Goal: Ability to achieve and maintain urine output will improve Outcome: Not Progressing

## 2024-06-28 NOTE — Progress Notes (Addendum)
 Pharmacy Antibiotic Note  Kyle Matthews is a 78 y.o. male admitted on 06/12/2024 with MRSE bacteremia. Pt noted to have vegetation on prosthetic aortic valve and tricuspid valve, and also possible CNS emboli.  Pharmacy has been consulted for daptomycin  dosing. Patient is now s/p TVR, CABG and Bentall procedure on 7/9. Noted that patient has an acute AKI post surgery with SCr up to 2.84 today.   Vancomycin  level only cleared from 38 to 33 in 27 hours. Discussed with Dr. Fleeta Matthews, Kyle Matthews has been on vancomcyin for at least 2 weeks for CNS coverage so now reasonable to switch to daptomycin .   Plan: Re-check Vancomycin  level 7/13 AM  When level < 20 will plan to start Daptomycin  700 mg every 48 hours Will need weekly CK monitoring once daptomycin  starts Monitor renal function   Height: (P) 5' 2 (157.5 cm) Weight: 73.3 kg (161 lb 9.6 oz) IBW/kg (Calculated) : (P) 54.6  Temp (24hrs), Avg:98.5 F (36.9 C), Min:97.9 F (36.6 C), Max:99 F (37.2 C)  Recent Labs  Lab 06/26/24 0415 06/26/24 0845 06/26/24 1558 06/26/24 1712 06/26/24 1951 06/27/24 0427 06/27/24 0939 06/27/24 1652 06/28/24 0446 06/28/24 1211  WBC 10.8*  --   --   --  15.7* 17.6*  --  17.3* 17.4*  --   CREATININE 1.00   < > 0.90 1.10  --  1.59*  --  2.02* 2.84*  --   VANCOTROUGH  --   --   --   --   --   --  38*  --   --  33*   < > = values in this interval not displayed.    Estimated Creatinine Clearance: 19.7 mL/min (A) (by C-G formula based on SCr of 2.84 mg/dL (H)).    Allergies  Allergen Reactions   Tetanus Toxoids Other (See Comments)    Childhood Allergy    Zetia  [Ezetimibe ] Diarrhea      Damien Quiet, PharmD, BCPS, BCIDP Infectious Diseases Clinical Pharmacist Phone: [phone] 06/28/2024 1:44 PM **Pharmacist phone directory can now be found on [email or web address] ([email or web address]).  Listed under [facility].

## 2024-06-28 NOTE — Progress Notes (Signed)
      [address]       [place] [address]             [phone]      POD # 2 Bentall, TVR, CABG  BP 121/79 (BP Location: Right Arm)   Pulse 89   Temp (!) 97.2 F (36.2 C) (Bladder)   Resp 10   Ht (P) 5' 2 (1.575 m)   Wt 73.3 kg   SpO2 96%   BMI (P) 29.56 kg/m  DDD paced 2L Payne 91% sat  Intake/Output Summary (Last 24 hours) at 06/28/2024 1732 Last data filed at 06/28/2024 1712 Gross per 24 hour  Intake 1431.13 ml  Output 1128 ml  Net 303.13 ml   Co-ox 58 this AM CBG well controlled  Elspeth C. Kerrin, MD [facility] [phone]

## 2024-06-28 NOTE — Progress Notes (Addendum)
 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 Started on midodrine  yesterday afternoon.  A line out.  BP improved this am.   Sitting up in chair. Walked 200 feet this morning.    Objective:   Weight Range: 73.3 kg Body mass index is 29.56 kg/m (pended).   Vital Signs:   Temp:  [97.9 F (36.6 C)-99 F (37.2 C)] 98.4 F (36.9 C) (07/11 0830) Pulse Rate:  [88-152] 89 (07/11 0830) Resp:  [12-26] 18 (07/11 0830) BP: (89-145)/(32-122) 124/81 (07/11 0830) SpO2:  [78 %-100 %] 97 % (07/11 0830) Arterial Line BP: (80-118)/(46-87) 82/76 (07/11 0245) Weight:  [73.3 kg] 73.3 kg (07/11 0500) Last BM Date : 06/25/24  Weight change: Filed Weights   06/26/24 0657 06/27/24 0457 06/28/24 0500  Weight: (P) 70.3 kg 76.3 kg 73.3 kg    Intake/Output:   Intake/Output Summary (Last 24 hours) at 06/28/2024 0853 Last data filed at 06/28/2024 0812 Gross per 24 hour  Intake 1815.92 ml  Output 1206 ml  Net 609.92 ml      Physical Exam    General:  Well appearing. Neck: JVP to midneck Cor: Regular rate & rhythm. No rubs, gallops or murmurs. Lungs: CTA, + CT Abdomen: Soft, nontender, nondistended.  Extremities: 2+ edema Neuro: Alert & orientedx3. Affect pleasant   Telemetry   AV paced 90  Labs    CBC Recent Labs    06/27/24 1652 06/28/24 0446  WBC 17.3* 17.4*  HGB 8.3* 7.9*  HCT 25.1* 23.9*  MCV 89.6 90.5  PLT 141* 150   Basic Metabolic Panel Recent Labs    92/89/74 0427 06/27/24 0843 06/27/24 1652 06/28/24 0446  NA 137   < > 138 136  K 5.5*   < > 4.7 4.7  CL 107  --  106 103  CO2 20*  --  21* 21*  GLUCOSE 123*  --  130* 116*  BUN 17  --  22 29*  CREATININE 1.59*  --  2.02* 2.84*  CALCIUM  8.4*  --   8.0* 7.7*  MG 4.1*  --  3.6*  --    < > = values in this interval not displayed.   Liver Function Tests No results for input(s): AST, ALT, ALKPHOS, BILITOT, PROT, ALBUMIN  in the last 72 hours. No results for input(s): LIPASE, AMYLASE in the last 72 hours. Cardiac Enzymes No results for input(s): CKTOTAL, CKMB, CKMBINDEX, TROPONINI in the last 72 hours.  BNP: BNP (last 3 results) Recent Labs    10/03/23 0412 10/04/23 0509 06/12/24 1819  BNP 547.0* 501.3* 169.0*    ProBNP (last 3 results) No results for input(s): PROBNP in the last 8760 hours.   D-Dimer No results for input(s): DDIMER in the last 72 hours. Hemoglobin A1C No results for input(s): HGBA1C in the last 72 hours. Fasting Lipid Panel No results for input(s): CHOL, HDL, LDLCALC, TRIG, CHOLHDL, LDLDIRECT in the last 72 hours. Thyroid  Function Tests No results for input(s): TSH, T4TOTAL, T3FREE, THYROIDAB in the last 72 hours.  Invalid input(s): FREET3  Other results:   Imaging    DG CHEST PORT 1 VIEW Result Date: 06/28/2024 CLINICAL DATA:  Endocarditis EXAM: PORTABLE CHEST 1 VIEW COMPARISON:  06/27/2024 FINDINGS: Interval extubation. No increased atelectasis.  Bilateral chest tubes remain. No pneumothorax. Mediastinal drain remains. Interval removal of NG tube. No pulmonary edema IMPRESSION: 1. Interval extubation and removal of NG tube. 2. No increase in atelectasis. 3. Bilateral chest tubes.  No pneumothorax. Electronically Signed   By: Jackquline Boxer M.D.   On: 06/28/2024 08:40     Medications:     Scheduled Medications:  acetaminophen   1,000 mg Oral Q6H   Or   acetaminophen  (TYLENOL ) oral liquid 160 mg/5 mL  1,000 mg Per Tube Q6H   aspirin  EC  325 mg Oral Daily   Or   aspirin   324 mg Per Tube Daily   bisacodyl   10 mg Oral Daily   Or   bisacodyl   10 mg Rectal Daily   Chlorhexidine  Gluconate Cloth  6 each Topical Daily   docusate sodium   200 mg  Oral Daily   finasteride   5 mg Oral QPM   insulin  aspart  0-24 Units Subcutaneous Q4H   midodrine   10 mg Oral Q8H   pantoprazole   40 mg Oral Daily   rosuvastatin   10 mg Oral Daily   sodium chloride  flush  3 mL Intravenous Q12H   vancomycin  variable dose per unstable renal function (pharmacist dosing)   Does not apply See admin instructions    Infusions:   ceFAZolin  (ANCEF ) IV Stopped (06/28/24 0043)   phenylephrine  (NEO-SYNEPHRINE) Adult infusion 40 mcg/min (06/28/24 0800)    PRN Medications: morphine  injection, ondansetron  (ZOFRAN ) IV, mouth rinse, oxyCODONE , sodium chloride  flush, traMADol     Patient Profile   78 y.o. male with history of severe AS s/p AVR in 11/24, CAD s/p CABG X 2 in 11/24, BPH s/p robot-assisted prostatectomy. 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 Prolonged pump time. EF preserved on intra-op TEE. -Off all pressors except low-dose neo. Now on midodrine  10 TID. Extremities slightly cool on exam. CO-OX 57% with Fick CI of 3 and Hgb 7.9. -Will need diuresis soon, holding off for now given AKI and BP. Discussed with TCTS. -AV pacing. Some concern he may require PPM given location of periannular abscess. EP consulted.   2. CAD -S/p CABG X 2 (LIMA to LAD, SVG to Ramus) 11/24 -S/p CABG X 1 (SVG to RCA) 07/09 -Continue aspirin  + statin   3. Acute CVA -Small left occipital infarct on MRI -Suspect d/t septic emboli   4. AKI -suspect 2/2 prolonged pump run -Scr 1>1.6>2.8 -Keep MAP > 70 for renal perfusion -Nephrology consulted    Length of Stay: [address], MANUELITA SAILOR, PA-C  06/28/2024, 8:53 AM  Advanced Heart Failure Team Pager  [phone] (M-F; 7a - 5p)  Please contact [facility] for night-coverage after hours (5p -7a ) and weekends on [email or web address]  Agree with above.   Feels a bit worse today. Weak.  SCr elevated. Co-ox marginal. On midodrine  for BP support.   Has complete AV block on tele without ventricular escape. Continue to AV pace.   General:  Weak appearing. No resp difficulty HEENT: normal Neck: supple.JVP to jaw . Carotids 2+ bilat; no bruits. No lymphadenopathy or thryomegaly appreciated. Cor: surgical dressing ok Regular rate & rhythm. No rubs, gallops or murmurs. Lungs:decreased at bases  Abdomen: soft, nontender, nondistended. No hepatosplenomegaly. No bruits or masses. Good bowel sounds. Extremities: no  cyanosis, clubbing, rash, 2+ edema Neuro: alert & orientedx3, cranial nerves grossly intact. moves all 4 extremities w/o difficulty. Affect pleasant  Volume overloaded. Developing ATN. Renal consulted.   Co-ox marginal. Would follow closely and low threshold to add inotropic support.   EP consulted. Will need PPM eventually.   D/w Dr. Lucas at bedside.   CRITICAL CARE Performed by: Cherrie Sieving  Total critical care time: 45 minutes  Critical care time was exclusive of separately billable procedures and treating other patients.  Critical care was necessary to treat or prevent imminent or life-threatening deterioration.  Critical care was time spent personally by me (independent of midlevel providers or residents) on the following activities: development of treatment plan with patient and/or surrogate as well as nursing, discussions with consultants, evaluation of patient's response to treatment, examination of patient, obtaining history from patient or surrogate, ordering and performing treatments and interventions, ordering and review of laboratory studies, ordering and review of radiographic studies, pulse oximetry and re-evaluation of patient's condition.  Sieving Cherrie, MD   10:07 PM

## 2024-06-28 NOTE — Consult Note (Signed)
 ELECTROPHYSIOLOGY CONSULT NOTE    Patient ID: Kyle Matthews MRN: [id number], DOB/AGE: 76/08/1946 77 y.o.  Admit date: 06/12/2024 Date of Consult: 06/28/2024  Primary Physician: Verena Mems, MD Primary Cardiologist: Lurena MARLA Red, MD  Electrophysiologist: New   Referring Provider: Dr. Lucas  Patient Profile: Kyle Matthews is a 78 y.o. male with a history of severe AS s/p AVR with 23 mm Edwards Inspiris Resilia pericardial valve in 11/24, CAD s/p CABG (LIMA to LAD, SVG to Ramus) at time of AVR, post-op AF, HTN, HLD, hx CVA, COPD, tobacco use, anemia, hx BPH s/p robot-assisted prostatectomy in 06/25 who is being seen today for the evaluation of post operative heart block at the request of Dr. Lucas.  HPI:  Kyle Matthews is a 78 y.o. male with medical history above.   Presented to [facility] 06/25 with chest pain, lower extremity edema and rigors. BCx grew methicillin resistant staph epidermidis. He was started on antibiotics.  Had MRI brain as part of initial workup and found to have acute left occipital lobe infarct. TTE with increased gradients across aortic valve prosthesis. He was transferred to [facility] for TEE to better assess for bioprosthetic valve endocarditis.   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   On 7/9 underwent redo sternotomy, Bentall procedure, CABG X1 (SVG to RCA) and tricuspid valve replacement w/ 27 mm Medtronic Mosaic porcine valve  Today, he remains in CHB with pacing down to 40 bpm and EP asked to see to follow along for pacemaker consideration.   Asymptomatic at rest. Walked this am. Understands he may need pacemaker.  Wife is present as well for conversation.   Labs Potassium4.7 (07/11 0446) Magnesium   3.6* (07/10 1652) Creatinine, ser  2.84* (07/11 0446) PLT  150 (07/11 0446) HGB  7.9* (07/11 0446) WBC 17.4* (07/11 0446)  .    Allergies, Medical, Surgical, Social, and Family Histories have been  reviewed and are referenced here-in when relevant for medical decision making.    Physical Exam: Vitals:   06/28/24 0915 06/28/24 0930 06/28/24 0945 06/28/24 1000  BP: 118/85 94/75  107/70  Pulse: 89 92  89  Resp: 15 (!) 21 20 14   Temp: 98.4 F (36.9 C)   98.4 F (36.9 C)  TempSrc:      SpO2: 96% 96%  91%  Weight:      Height:        GEN- NAD, A&O x 3, normal affect HEENT: Normocephalic, atraumatic Lungs- CTAB, Normal effort.  Heart- Regular rate and rhythm, No M/G/R.  GI- Soft, NT, ND.  Extremities- No clubbing, cyanosis, or edema   Radiology/Studies:  [facility] 06/20/2024 RA 5/7, mean 5 mmHg RV 23/3, EDP 7 mmHg PA 23/9, mean 12 mmHg PW 12/11, mean 11 mmHg. PA saturation 72%, AO saturation 98%. QP/QS 1.0. CO 6.6, CI 3.88.  Stable CAD with patent LIMA to LAD and SVG to RI.   Echo 06/14/2024 - LVEF 55-60%, severe TR, Concerns for thrombosis of prior AVR  TEE 7/1 with mobile density on TV and AV.   EKG:06/27/2024 shows AV dual pacing at 70 bpm (personally reviewed)  TELEMETRY: AV dual pacing at 90 bpm (personally reviewed)  Assessment/Plan:  CHB Post operative Per Dr. Lucas, conduction recovery is unlikely given extent of surgery and periannular abscess.  With bacteremia/Endocarditis, he will need to be considered for leadless pacing.   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 Prolonged pump time. EF preserved on intra-op TEE. Pleural tubes remain in.  Remains on Neo and midodrine  for BP support.   CAD s/p CABG S/p CABG X 2 (LIMA to LAD, SVG to Ramus) 11/24 S/p CABG X 1 (SVG to RCA) 07/09  AKI Continue to follow recovery Pre-op  Cr 0.8 - 1.0 Cr 2.84 this am.   EP will follow along.   For questions or updates, please contact [facility] Please consult [email or web address] for contact info under     Signed, Ozell Prentice Passey, PA-C  06/28/2024, 10:19 AM

## 2024-06-28 NOTE — Progress Notes (Addendum)
 2 Days Post-Op Procedure(s) (LRB): TRICUSPID VALVE REPLACEMENT USING MOSIAC BRIOPROSTHESIS VALVE SIZE (N/A) ECHOCARDIOGRAM, TRANSESOPHAGEAL (N/A) CORONARY ARTERY BYPASS GRAFTING (CABG) TIMES ONE USING ENDOSCOPICALLY HARVESTED LEFT GREATER SAPHENOUS VEIN (N/A) BENTALL PROCEDURE USING KONECT AORTIC VALVE CONDUIT SIZE (N/A) REDO STERNOTOMY (N/A) Subjective:  Sitting up in chair. Fairly comfortable. Did not sleep much.   Still on neo 40 mcg. Received one dose of midodrine  last night.  Walked this am  Objective: Vital signs in last 24 hours: Temp:  [97.9 F (36.6 C)-99 F (37.2 C)] 98.5 F (36.9 C) (07/11 0342) Pulse Rate:  [80-152] 89 (07/11 0630) Cardiac Rhythm: A-V Sequential paced (07/11 0400) Resp:  [12-26] 13 (07/11 0630) BP: (82-145)/(32-122) 102/68 (07/11 0630) SpO2:  [78 %-100 %] 99 % (07/11 0630) Arterial Line BP: (80-118)/(46-87) 82/76 (07/11 0245) FiO2 (%):  [40 %] 40 % (07/10 0831) Weight:  [73.3 kg] 73.3 kg (07/11 0500)  Hemodynamic parameters for last 24 hours:    Intake/Output from previous day: 07/10 0701 - 07/11 0700 In: 1186.7 [P.O.:360; I.V.:716.7; IV Piggyback:100] Out: 1177 [Urine:637; Chest Tube:540] Intake/Output this shift: Total I/O In: 429.6 [P.O.:240; I.V.:179.6; Other:10] Out: 192 [Urine:37; Chest Tube:155]  General appearance: alert and cooperative Neurologic: intact Heart: regular rate and rhythm Lungs: clear to auscultation bilaterally Abdomen: soft, non-tender; bowel sounds normal Extremities: edema moderate Wound: dressing dry  Lab Results: Recent Labs    06/27/24 1652 06/28/24 0446  WBC 17.3* 17.4*  HGB 8.3* 7.9*  HCT 25.1* 23.9*  PLT 141* 150   BMET:  Recent Labs    06/27/24 1652 06/28/24 0446  NA 138 136  K 4.7 4.7  CL 106 103  CO2 21* 21*  GLUCOSE 130* 116*  BUN 22 29*  CREATININE 2.02* 2.84*  CALCIUM  8.0* 7.7*    PT/INR:  Recent Labs    06/26/24 1951  LABPROT 12.1  INR 0.9   ABG    Component  Value Date/Time   PHART 7.361 06/27/2024 1259   HCO3 22.9 06/27/2024 1259   TCO2 24 06/27/2024 1259   ACIDBASEDEF 2.0 06/27/2024 1259   O2SAT 92 06/27/2024 1259   CBG (last 3)  Recent Labs    06/27/24 2020 06/27/24 2356 06/28/24 0343  GLUCAP 150* 125* 124*   CXR: ok  Assessment/Plan: S/P Procedure(s) (LRB): TRICUSPID VALVE REPLACEMENT USING MOSIAC BRIOPROSTHESIS VALVE SIZE (N/A) ECHOCARDIOGRAM, TRANSESOPHAGEAL (N/A) CORONARY ARTERY BYPASS GRAFTING (CABG) TIMES ONE USING ENDOSCOPICALLY HARVESTED LEFT GREATER SAPHENOUS VEIN (N/A) BENTALL PROCEDURE USING KONECT AORTIC VALVE CONDUIT SIZE (N/A) REDO STERNOTOMY (N/A)  POD 2 Still on some neo for BP support. Continue midodrine .  AV paced 90. I turned it down to 40 and he was still pacing. I am sure he will have persistent CHB with preop RBBB/LPFB and degree of infection and debridement around the area of AV node. Will ask EP to see him. Temp wires appear to working well so far.  AKI from prolonged pump run for this complex surgery. Creat up to 2.84. preop 0.8-1 so I expect this will recover. Hold off on diuretic since still on neo/midodrine . Will consult nephrology.  DC MT's. Keep both pleural tubes in.  Continuing Vanc per pharmacy. Trough yesterday was high. Need to be cautious with renal dysfunction.  Followup on operative cultures.  IS, OOB, mobilize as tolerated.  DC arterial line.   LOS: 13 days    Kyle Matthews 06/28/2024

## 2024-06-29 ENCOUNTER — Inpatient Hospital Stay (HOSPITAL_COMMUNITY)

## 2024-06-29 DIAGNOSIS — I330 Acute and subacute infective endocarditis: Secondary | ICD-10-CM | POA: Diagnosis not present

## 2024-06-29 DIAGNOSIS — T826XXA Infection and inflammatory reaction due to cardiac valve prosthesis, initial encounter: Secondary | ICD-10-CM | POA: Diagnosis not present

## 2024-06-29 LAB — BASIC METABOLIC PANEL WITH GFR
Anion gap: 13 (ref 5–15)
BUN: 43 mg/dL — ABNORMAL HIGH (ref 8–23)
CO2: 20 mmol/L — ABNORMAL LOW (ref 22–32)
Calcium: 7.5 mg/dL — ABNORMAL LOW (ref 8.9–10.3)
Chloride: 102 mmol/L (ref 98–111)
Creatinine, Ser: 3.58 mg/dL — ABNORMAL HIGH (ref 0.61–1.24)
GFR, Estimated: 17 mL/min — ABNORMAL LOW (ref >60)
Glucose, Bld: 109 mg/dL — ABNORMAL HIGH (ref 70–99)
Potassium: 4.4 mmol/L (ref 3.5–5.1)
Sodium: 135 mmol/L (ref 135–145)

## 2024-06-29 LAB — CBC
HCT: 22.4 % — ABNORMAL LOW (ref 39.0–52.0)
Hemoglobin: 7.3 g/dL — ABNORMAL LOW (ref 13.0–17.0)
MCH: 29.7 pg (ref 26.0–34.0)
MCHC: 32.6 g/dL (ref 30.0–36.0)
MCV: 91.1 fL (ref 80.0–100.0)
Platelets: 125 K/uL — ABNORMAL LOW (ref 150–400)
RBC: 2.46 MIL/uL — ABNORMAL LOW (ref 4.22–5.81)
RDW: 16.2 % — ABNORMAL HIGH (ref 11.5–15.5)
WBC: 14.2 K/uL — ABNORMAL HIGH (ref 4.0–10.5)
nRBC: 0 % (ref 0.0–0.2)

## 2024-06-29 LAB — GLUCOSE, CAPILLARY
Glucose-Capillary: 109 mg/dL — ABNORMAL HIGH (ref 70–99)
Glucose-Capillary: 111 mg/dL — ABNORMAL HIGH (ref 70–99)
Glucose-Capillary: 104 mg/dL — ABNORMAL HIGH (ref 70–99)
Glucose-Capillary: 98 mg/dL (ref 70–99)
Glucose-Capillary: 112 mg/dL — ABNORMAL HIGH (ref 70–99)

## 2024-06-29 LAB — COOXEMETRY PANEL
Carboxyhemoglobin: 1.7 % — ABNORMAL HIGH (ref 0.5–1.5)
Methemoglobin: <0.7 % (ref 0.0–1.5)
O2 Saturation: 77 %
Total hemoglobin: 7.2 g/dL — ABNORMAL LOW (ref 12.0–16.0)

## 2024-06-29 MED ORDER — SODIUM CHLORIDE 0.9 % IV SOLN: 12.5000 mg | Freq: Four times a day (QID) | INTRAVENOUS | Status: DC | PRN

## 2024-06-29 MED ORDER — POLYETHYLENE GLYCOL 3350 17 G PO PACK
17.0000 g | PACK | Freq: Every day | ORAL | Status: DC
  Administered 2024-06-29 – 2024-06-30 (×2): 17 g via ORAL
  Filled 2024-06-29 (×2): qty 1

## 2024-06-29 MED ORDER — METOCLOPRAMIDE HCL 5 MG/ML IJ SOLN
5.0000 mg | Freq: Four times a day (QID) | INTRAMUSCULAR | Status: AC
  Administered 2024-06-29 – 2024-07-01 (×8): 5 mg via INTRAVENOUS
  Filled 2024-06-29 (×8): qty 2

## 2024-06-29 MED ORDER — SODIUM CHLORIDE 0.9 % IV SOLN: 12.5000 mg | Freq: Three times a day (TID) | INTRAVENOUS | Status: DC | PRN

## 2024-06-29 MED ORDER — INSULIN ASPART 100 UNIT/ML IJ SOLN
0.0000 [IU] | Freq: Three times a day (TID) | INTRAMUSCULAR | Status: DC
  Administered 2024-06-30: 1 [IU] via SUBCUTANEOUS

## 2024-06-29 NOTE — Progress Notes (Signed)
      [address]       [place] [address]             [phone]      POD # 3  No new issues  Ambulating well  BP 117/72   Pulse 80   Temp 97.7 F (36.5 C)   Resp 12   Ht (P) 5' 2 (1.575 m)   Wt 74.8 kg   SpO2 97%   BMI (P) 30.18 kg/m   Intake/Output Summary (Last 24 hours) at 06/29/2024 1728 Last data filed at 06/29/2024 1700 Gross per 24 hour  Intake 483 ml  Output 1232 ml  Net -749 ml   UO a little better today  Elspeth C. Kerrin, MD [facility] [phone]

## 2024-06-29 NOTE — Progress Notes (Signed)
 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 Feels weak.   Essentially anuric Scr 2.8 -> 3.6   No co-ox drawn  Remains pacer dependent   Objective:   Weight Range: 74.8 kg Body mass index is 30.18 kg/m (pended).   Vital Signs:   Temp:  [97.2 F (36.2 C)-98.4 F (36.9 C)] 98.1 F (36.7 C) (07/12 0900) Pulse Rate:  [79-94] 80 (07/12 1000) Resp:  [9-18] 12 (07/12 1000) BP: (96-136)/(59-81) 122/73 (07/12 0900) SpO2:  [95 %-100 %] 100 % (07/12 1000) Weight:  [74.8 kg] 74.8 kg (07/12 0630) Last BM Date : 06/25/24  Weight change: Filed Weights   06/27/24 0457 06/28/24 0500 06/29/24 0630  Weight: 76.3 kg 73.3 kg 74.8 kg    Intake/Output:   Intake/Output Summary (Last 24 hours) at 06/29/2024 1047 Last data filed at 06/29/2024 1001 Gross per 24 hour  Intake 343 ml  Output 882 ml  Net -539 ml      Physical Exam    General:  Weak appearing. No resp difficulty HEENT: normal Neck: supple. JVP to ear. Carotids 2+ bilat; no bruits. No lymphadenopathy or thryomegaly appreciated. Cor: Sternal wound ok . Regular rate & rhythm. No rubs, gallops or murmurs. Lungs: decreased at bases  Abdomen: soft, nontender, nondistended. No hepatosplenomegaly. No bruits or masses. Good bowel sounds. Extremities: no cyanosis, clubbing, rash, 2+ edema Neuro: alert & orientedx3, cranial nerves grossly intact. moves all 4 extremities w/o difficulty. Affect pleasant  Telemetry   AV paced 80 (sinus rhythm with CHB no ventricular escape) Personally reviewed  Labs    CBC Recent Labs    06/28/24 0446 06/29/24 0432  WBC 17.4* 14.2*  HGB 7.9* 7.3*  HCT 23.9* 22.4*  MCV 90.5 91.1  PLT 150 125*   Basic Metabolic Panel Recent Labs     06/27/24 0427 06/27/24 0843 06/27/24 1652 06/28/24 0446 06/29/24 0432  NA 137   < > 138 136 135  K 5.5*   < > 4.7 4.7 4.4  CL 107  --  106 103 102  CO2 20*  --  21* 21* 20*  GLUCOSE 123*  --  130* 116* 109*  BUN 17  --  22 29* 43*  CREATININE 1.59*  --  2.02* 2.84* 3.58*  CALCIUM  8.4*  --  8.0* 7.7* 7.5*  MG 4.1*  --  3.6*  --   --    < > = values in this interval not displayed.   Liver Function Tests No results for input(s): AST, ALT, ALKPHOS, BILITOT, PROT, ALBUMIN  in the last 72 hours. No results for input(s): LIPASE, AMYLASE in the last 72 hours. Cardiac Enzymes No results for input(s): CKTOTAL, CKMB, CKMBINDEX, TROPONINI in the last 72 hours.  BNP: BNP (last 3 results) Recent Labs    10/03/23 0412 10/04/23 0509 06/12/24 1819  BNP 547.0* 501.3* 169.0*    ProBNP (last 3 results) No results for input(s): PROBNP in the last 8760 hours.   D-Dimer No results for input(s): DDIMER in the last 72 hours. Hemoglobin A1C No results for input(s): HGBA1C in the last 72 hours. Fasting Lipid Panel No results for input(s): CHOL, HDL, LDLCALC, TRIG, CHOLHDL, LDLDIRECT in the last 72 hours. Thyroid  Function Tests No results for input(s):  TSH, T4TOTAL, T3FREE, THYROIDAB in the last 72 hours.  Invalid input(s): FREET3  Other results:   Imaging    DG CHEST PORT 1 VIEW Result Date: 06/29/2024 CLINICAL DATA:  [id number]. Encounter for chest tube removal. Status post AVR and CABG., tricuspid valve replacement. EXAM: PORTABLE CHEST 1 VIEW COMPARISON:  Portable chest yesterday at 6:09 a.m. FINDINGS: Bilateral chest tubes remain in place with interval removal of mediastinal drain. No measurable pneumothorax. A right IJ catheter introducer sheath again terminates in the upper SVC. Stable CABG change. Metallic AVR is again noted. I do not see the tricuspid valve replacement. Mild stable cardiomegaly. The aorta is tortuous and calcified  with stable mediastinum. No vascular congestion is seen. There is patchy consolidation or atelectasis in the left base, linear atelectatic foci in both mid lung regions. Small pleural effusions. Remaining lungs are clear. Overall aeration seems unchanged. Stable osseous structures. IMPRESSION: 1. Interval removal of mediastinal drain. No measurable pneumothorax. 2. Stable cardiomegaly. 3. Stable patchy consolidation or atelectasis in the left base, stable overall aeration. 4. Small pleural effusions. 5. Aortic atherosclerosis. Electronically Signed   By: Francis Quam M.D.   On: 06/29/2024 07:38     Medications:     Scheduled Medications:  acetaminophen   1,000 mg Oral Q6H   Or   acetaminophen  (TYLENOL ) oral liquid 160 mg/5 mL  1,000 mg Per Tube Q6H   aspirin  EC  325 mg Oral Daily   Or   aspirin   324 mg Per Tube Daily   bisacodyl   10 mg Oral Daily   Or   bisacodyl   10 mg Rectal Daily   Chlorhexidine  Gluconate Cloth  6 each Topical Daily   docusate sodium   200 mg Oral Daily   finasteride   5 mg Oral QPM   insulin  aspart  0-9 Units Subcutaneous TID WC   metoCLOPramide  (REGLAN ) injection  5 mg Intravenous Q6H   midodrine   10 mg Oral Q8H   pantoprazole   40 mg Oral Daily   rosuvastatin   10 mg Oral Daily   sodium chloride  flush  3 mL Intravenous Q12H    Infusions:  chlorproMAZINE (THORAZINE) 12.5 mg in sodium chloride  0.9 % 25 mL IVPB     promethazine (PHENERGAN) injection (IM or IVPB)      PRN Medications: chlorproMAZINE (THORAZINE) 12.5 mg in sodium chloride  0.9 % 25 mL IVPB, HYDROmorphone  (DILAUDID ) injection, ondansetron  (ZOFRAN ) IV, mouth rinse, oxyCODONE , promethazine (PHENERGAN) injection (IM or IVPB), sodium chloride  flush, traMADol     Patient Profile   78 y.o. male with history of severe AS s/p AVR in 11/24, CAD s/p CABG X 2 in 11/24, BPH s/p robot-assisted prostatectomy. 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 Prolonged pump time. EF preserved on intra-op TEE. - Off pressors/inortopes. On midodrine  for BP support  - Volume overloaded. Diuresis limited by AKI. Renal following. May need CVVHD soon   2. CAD -S/p CABG X 2 (LIMA to LAD, SVG to Ramus) 11/24 -S/p CABG X 1 (SVG to RCA) 07/09 - No s/s angina -Continue aspirin  + statin   3. AKI -suspect 2/2 prolonged pump run -Scr 1>1.6>2.8-> 3.6 -Keep MAP > 70 for renal perfusion -Nephrology consulted - Hopefully will improve soon. May need CVVHD  4. Acute CVA -Small left occipital  infarct on MRI -Suspect d/t septic emboli   5. Post-op blood loss anemia - hgb 7.3 - follow  6. CHB  - conduction system likely resected during surgery due to abscess - pacer dependent - will need PPM prior to d/c  CRITICAL CARE Performed by: Jeffrie Lofstrom  Total critical care time: 42 minutes  Critical care time was exclusive of separately billable procedures and treating other patients.  Critical care was necessary to treat or prevent imminent or life-threatening deterioration.  Critical care was time spent personally by me (independent of midlevel providers or residents) on the following activities: development of treatment plan with patient and/or surrogate as well as nursing, discussions with consultants, evaluation of patient's response to treatment, examination of patient, obtaining history from patient or surrogate, ordering and performing treatments and interventions, ordering and review of laboratory studies, ordering and review of radiographic studies, pulse oximetry and re-evaluation of patient's condition.  Length of Stay: 14  Toribio Fuel, MD  06/29/2024,  10:47 AM  Advanced Heart Failure Team Pager [phone] (M-F; 7a - 5p)  Please contact [facility] for night-coverage after hours (5p -7a ) and weekends on [email or web address]

## 2024-06-29 NOTE — Progress Notes (Signed)
 3 Days Post-Op Procedure(s) (LRB): TRICUSPID VALVE REPLACEMENT USING MOSIAC BRIOPROSTHESIS VALVE SIZE (N/A) ECHOCARDIOGRAM, TRANSESOPHAGEAL (N/A) CORONARY ARTERY BYPASS GRAFTING (CABG) TIMES ONE USING ENDOSCOPICALLY HARVESTED LEFT GREATER SAPHENOUS VEIN (N/A) BENTALL PROCEDURE USING KONECT AORTIC VALVE CONDUIT SIZE (N/A) REDO STERNOTOMY (N/A) Subjective: C/o hiccups, nausea, denies pain  Objective: Vital signs in last 24 hours: Temp:  [97.2 F (36.2 C)-98.4 F (36.9 C)] 98.1 F (36.7 C) (07/12 0700) Pulse Rate:  [79-94] 79 (07/12 0700) Cardiac Rhythm: A-V Sequential paced (07/11 2000) Resp:  [9-21] 15 (07/12 0700) BP: (94-138)/(59-85) 135/81 (07/12 0700) SpO2:  [91 %-100 %] 96 % (07/12 0700) Weight:  [74.8 kg] 74.8 kg (07/12 0630)  Hemodynamic parameters for last 24 hours:    Intake/Output from previous day: 07/11 0701 - 07/12 0700 In: 944.3 [P.O.:480; I.V.:268.8; IV Piggyback:195.5] Out: 1118 [Urine:391; Emesis/NG output:475; Chest Tube:252] Intake/Output this shift: No intake/output data recorded.  General appearance: alert, cooperative, and no distress Neurologic: intact Heart: regular rate and rhythm and paced Lungs: diminished breath sounds bibasilar Abdomen: distended, nontender  Lab Results: Recent Labs    06/28/24 0446 06/29/24 0432  WBC 17.4* 14.2*  HGB 7.9* 7.3*  HCT 23.9* 22.4*  PLT 150 125*   BMET:  Recent Labs    06/28/24 0446 06/29/24 0432  NA 136 135  K 4.7 4.4  CL 103 102  CO2 21* 20*  GLUCOSE 116* 109*  BUN 29* 43*  CREATININE 2.84* 3.58*  CALCIUM  7.7* 7.5*    PT/INR:  Recent Labs    06/26/24 1951  LABPROT 12.1  INR 0.9   ABG    Component Value Date/Time   PHART 7.361 06/27/2024 1259   HCO3 22.9 06/27/2024 1259   TCO2 24 06/27/2024 1259   ACIDBASEDEF 2.0 06/27/2024 1259   O2SAT 57.7 06/28/2024 0916   CBG (last 3)  Recent Labs    06/28/24 2007 06/28/24 2331 06/29/24 0409  GLUCAP 113* 117* 109*     Assessment/Plan: S/P Procedure(s) (LRB): TRICUSPID VALVE REPLACEMENT USING MOSIAC BRIOPROSTHESIS VALVE SIZE (N/A) ECHOCARDIOGRAM, TRANSESOPHAGEAL (N/A) CORONARY ARTERY BYPASS GRAFTING (CABG) TIMES ONE USING ENDOSCOPICALLY HARVESTED LEFT GREATER SAPHENOUS VEIN (N/A) BENTALL PROCEDURE USING KONECT AORTIC VALVE CONDUIT SIZE (N/A) REDO STERNOTOMY (N/A) POD # 3\ NEURO- intact CV- CHB requiring DDD pacing- will need permanent pacer  Hemodynamics ok with midodrine  ID- was on Vancomycin - level high- dc'ed  Per ID- Daptomycin  to complete course although not on at present  WBC down, afebrile RESP- continue IS  Keep pleural tubes today RENAL- creatinine continues to rise with AKI  Nephrology consulting  Mild acidosis, K ok ENDO- CBG well controlled  Change SSI to [facility] and HS with renal dosing GI- nausea has been problematic  On reglan , add phenergan PRN  Hiccups- thorazine PRN Deconditioning- mobilize   LOS: 14 days    Kyle Matthews 06/29/2024

## 2024-06-29 NOTE — Progress Notes (Signed)
 [facility] Progress Note   Subjective:   Ongoing hiccups and nausea today but he says he felt better enough to nap today.  No new issues.  UOP 37mL --> ultimately after lasix  80mg  IV given.    Objective Vitals:   06/29/24 0600 06/29/24 0630 06/29/24 0700 06/29/24 0800  BP: 99/62  135/81 104/78  Pulse: 80  79 80  Resp: 11  15 18   Temp: 98.1 F (36.7 C)  98.1 F (36.7 C) 98.2 F (36.8 C)  TempSrc:      SpO2: 97%  96% 100%  Weight:  74.8 kg    Height:       Physical Exam Gen: ill but nontoxic appearing  Eyes: EOMI ENT: MM tacky mouth breathing CV: paced on monitor, temp pacer RIJ central  line Abd:  soft, mildly distended Lungs: normal WOB and 99% on 2L [place], dec BS bases GU: foley with scant amber urine Extr: trace to 1+ diffuse edema, TED hose on Neuro: conversant, nonfocal  Additional Objective Labs: Basic Metabolic Panel: Recent Labs  Lab 06/27/24 1652 06/28/24 0446 06/29/24 0432  NA 138 136 135  K 4.7 4.7 4.4  CL 106 103 102  CO2 21* 21* 20*  GLUCOSE 130* 116* 109*  BUN 22 29* 43*  CREATININE 2.02* 2.84* 3.58*  CALCIUM  8.0* 7.7* 7.5*   Liver Function Tests: No results for input(s): AST, ALT, ALKPHOS, BILITOT, PROT, ALBUMIN  in the last 168 hours. No results for input(s): LIPASE, AMYLASE in the last 168 hours. CBC: Recent Labs  Lab 06/26/24 1951 06/27/24 0427 06/27/24 0843 06/27/24 1652 06/28/24 0446 06/29/24 0432  WBC 15.7* 17.6*  --  17.3* 17.4* 14.2*  HGB 9.5* 9.7*   < > 8.3* 7.9* 7.3*  HCT 28.2* 28.6*   < > 25.1* 23.9* 22.4*  MCV 88.7 89.1  --  89.6 90.5 91.1  PLT 145* 175  --  141* 150 125*   < > = values in this interval not displayed.   Blood Culture    Component Value Date/Time   SDES TISSUE 06/26/2024 1448   SPECREQUEST TRICUSPID VALVE 06/26/2024 1448   CULT  06/26/2024 1448    NO GROWTH 2 DAYS NO ANAEROBES ISOLATED; CULTURE IN PROGRESS FOR 5 DAYS Performed at [facility], [address], [place], [place] [address]    REPTSTATUS PENDING 06/26/2024 1448    Cardiac Enzymes: No results for input(s): CKTOTAL, CKMB, CKMBINDEX, TROPONINI in the last 168 hours. CBG: Recent Labs  Lab 06/28/24 1521 06/28/24 2007 06/28/24 2331 06/29/24 0409 06/29/24 0737  GLUCAP 134* 113* 117* 109* 111*   Iron  Studies: No results for input(s): IRON , TIBC, TRANSFERRIN, FERRITIN in the last 72 hours. @lablastinr3 @ Studies/Results: DG CHEST PORT 1 VIEW Result Date: 06/29/2024 CLINICAL DATA:  [id number]. Encounter for chest tube removal. Status post AVR and CABG., tricuspid valve replacement. EXAM: PORTABLE CHEST 1 VIEW COMPARISON:  Portable chest yesterday at 6:09 a.m. FINDINGS: Bilateral chest tubes remain in place with interval removal of mediastinal drain. No measurable pneumothorax. A right IJ catheter introducer sheath again terminates in the upper SVC. Stable CABG change. Metallic AVR is again noted. I do not see the tricuspid valve replacement. Mild stable cardiomegaly. The aorta is tortuous and calcified with stable mediastinum. No vascular congestion is seen. There is patchy consolidation or atelectasis in the left base, linear atelectatic foci in both mid lung regions. Small pleural effusions. Remaining lungs are clear. Overall aeration seems unchanged. Stable osseous structures. IMPRESSION: 1.  Interval removal of mediastinal drain. No measurable pneumothorax. 2. Stable cardiomegaly. 3. Stable patchy consolidation or atelectasis in the left base, stable overall aeration. 4. Small pleural effusions. 5. Aortic atherosclerosis. Electronically Signed   By: Francis Quam M.D.   On: 06/29/2024 07:38   DG CHEST PORT 1 VIEW Result Date: 06/28/2024 CLINICAL DATA:  Endocarditis EXAM: PORTABLE CHEST 1 VIEW COMPARISON:  06/27/2024 FINDINGS: Interval extubation. No increased atelectasis. Bilateral chest tubes remain. No pneumothorax. Mediastinal drain remains. Interval removal of NG tube. No  pulmonary edema IMPRESSION: 1. Interval extubation and removal of NG tube. 2. No increase in atelectasis. 3. Bilateral chest tubes.  No pneumothorax. Electronically Signed   By: Jackquline Boxer M.D.   On: 06/28/2024 08:40   Medications:  chlorproMAZINE (THORAZINE) 12.5 mg in sodium chloride  0.9 % 25 mL IVPB     promethazine (PHENERGAN) injection (IM or IVPB)      acetaminophen   1,000 mg Oral Q6H   Or   acetaminophen  (TYLENOL ) oral liquid 160 mg/5 mL  1,000 mg Per Tube Q6H   aspirin  EC  325 mg Oral Daily   Or   aspirin   324 mg Per Tube Daily   bisacodyl   10 mg Oral Daily   Or   bisacodyl   10 mg Rectal Daily   Chlorhexidine  Gluconate Cloth  6 each Topical Daily   docusate sodium   200 mg Oral Daily   finasteride   5 mg Oral QPM   insulin  aspart  0-9 Units Subcutaneous TID WC   metoCLOPramide  (REGLAN ) injection  5 mg Intravenous Q6H   midodrine   10 mg Oral Q8H   pantoprazole   40 mg Oral Daily   rosuvastatin   10 mg Oral Daily   sodium chloride  flush  3 mL Intravenous Q12H    arry Traywick is an 78 y.o. male CAD s/p CABG 2024 + AoV replacement, HTN, HL, COPD, BPH s/p prostatectomy currently admitted with endocarditis s/p Bentall procedure, TV replacement, CABG on 7/9 now with AKI.   **AKI, oliguric: normal baseline now with oliguric AKI in the setting of hemodynamic insults during lengthy surgery, poss contribution of high vanc level. Foley  UA with blood, do not suspect GN.  BUN/Cr continue worsen but no current indications for RRT - nausea likely multifactorial and BUN <<100.  Don't see indication for IVF or lasix  right now.  Serial labs, strict I/Os, dose meds for crcl, avoid nephrotoxins and hypotension.  HD if develops indications - he is agreeable If needed.  Informed need may arise in next 24-48h.     **disseminated MRSA:  endocarditis, CNS septic embolism:  ID following, did 2 weeks vanc (better for CNS penetration with brain emboli), now on dapto. I do not think he has a contraindication  for future vanc use should need arise, would just closely monitor levels.   **Anemia:  Hb in the 7-8s, transfusion per primary PRN.    **s/p AoV and TV replacements 7/9   **CHB: EP consulting , currently paced   Will follow, reach out with concerns.   Manuelita Barters MD 06/29/2024, 9:29 AM  [facility] Pager: [phone]

## 2024-06-30 ENCOUNTER — Inpatient Hospital Stay (HOSPITAL_COMMUNITY)

## 2024-06-30 DIAGNOSIS — Z952 Presence of prosthetic heart valve: Secondary | ICD-10-CM | POA: Diagnosis not present

## 2024-06-30 DIAGNOSIS — I442 Atrioventricular block, complete: Secondary | ICD-10-CM

## 2024-06-30 DIAGNOSIS — I330 Acute and subacute infective endocarditis: Secondary | ICD-10-CM | POA: Diagnosis not present

## 2024-06-30 DIAGNOSIS — R918 Other nonspecific abnormal finding of lung field: Secondary | ICD-10-CM | POA: Diagnosis not present

## 2024-06-30 DIAGNOSIS — T826XXA Infection and inflammatory reaction due to cardiac valve prosthesis, initial encounter: Secondary | ICD-10-CM | POA: Diagnosis not present

## 2024-06-30 DIAGNOSIS — J90 Pleural effusion, not elsewhere classified: Secondary | ICD-10-CM | POA: Diagnosis not present

## 2024-06-30 DIAGNOSIS — I517 Cardiomegaly: Secondary | ICD-10-CM | POA: Diagnosis not present

## 2024-06-30 LAB — COOXEMETRY PANEL
Carboxyhemoglobin: 3.3 % — ABNORMAL HIGH (ref 0.5–1.5)
Methemoglobin: 2.6 % — ABNORMAL HIGH (ref 0.0–1.5)
O2 Saturation: 77 %
Total hemoglobin: 7.2 g/dL — ABNORMAL LOW (ref 12.0–16.0)

## 2024-06-30 LAB — CBC
HCT: 21.2 % — ABNORMAL LOW (ref 39.0–52.0)
Hemoglobin: 6.9 g/dL — CL (ref 13.0–17.0)
MCH: 29.7 pg (ref 26.0–34.0)
MCHC: 32.5 g/dL (ref 30.0–36.0)
MCV: 91.4 fL (ref 80.0–100.0)
Platelets: 138 K/uL — ABNORMAL LOW (ref 150–400)
RBC: 2.32 MIL/uL — ABNORMAL LOW (ref 4.22–5.81)
RDW: 16.2 % — ABNORMAL HIGH (ref 11.5–15.5)
WBC: 12.7 K/uL — ABNORMAL HIGH (ref 4.0–10.5)
nRBC: 0.2 % (ref 0.0–0.2)

## 2024-06-30 LAB — GLUCOSE, CAPILLARY
Glucose-Capillary: 82 mg/dL (ref 70–99)
Glucose-Capillary: 118 mg/dL — ABNORMAL HIGH (ref 70–99)
Glucose-Capillary: 100 mg/dL — ABNORMAL HIGH (ref 70–99)
Glucose-Capillary: 128 mg/dL — ABNORMAL HIGH (ref 70–99)
Glucose-Capillary: 95 mg/dL (ref 70–99)

## 2024-06-30 LAB — BASIC METABOLIC PANEL WITH GFR
Anion gap: 8 (ref 5–15)
BUN: 51 mg/dL — ABNORMAL HIGH (ref 8–23)
CO2: 23 mmol/L (ref 22–32)
Calcium: 7.3 mg/dL — ABNORMAL LOW (ref 8.9–10.3)
Chloride: 100 mmol/L (ref 98–111)
Creatinine, Ser: 2.81 mg/dL — ABNORMAL HIGH (ref 0.61–1.24)
GFR, Estimated: 23 mL/min — ABNORMAL LOW (ref >60)
Glucose, Bld: 92 mg/dL (ref 70–99)
Potassium: 3.7 mmol/L (ref 3.5–5.1)
Sodium: 131 mmol/L — ABNORMAL LOW (ref 135–145)

## 2024-06-30 LAB — PREPARE RBC (CROSSMATCH)

## 2024-06-30 LAB — VANCOMYCIN, TROUGH: Vancomycin Tr: 22 ug/mL (ref 15–20)

## 2024-06-30 LAB — HEMOGLOBIN AND HEMATOCRIT, BLOOD
HCT: 25.5 % — ABNORMAL LOW (ref 39.0–52.0)
Hemoglobin: 8.4 g/dL — ABNORMAL LOW (ref 13.0–17.0)

## 2024-06-30 MED ORDER — DOCUSATE SODIUM 100 MG PO CAPS
200.0000 mg | ORAL_CAPSULE | Freq: Two times a day (BID) | ORAL | Status: DC
  Administered 2024-06-30 – 2024-07-03 (×3): 200 mg via ORAL
  Filled 2024-06-30 (×5): qty 2

## 2024-06-30 MED ORDER — SODIUM CHLORIDE 0.9% IV SOLUTION: Freq: Once | INTRAVENOUS | Status: DC

## 2024-06-30 MED ORDER — MIDODRINE HCL 5 MG PO TABS
5.0000 mg | ORAL_TABLET | Freq: Three times a day (TID) | ORAL | Status: DC
  Administered 2024-06-30 – 2024-07-02 (×6): 5 mg via ORAL
  Filled 2024-06-30 (×6): qty 1

## 2024-06-30 MED ORDER — POLYETHYLENE GLYCOL 3350 17 G PO PACK
17.0000 g | PACK | Freq: Two times a day (BID) | ORAL | Status: DC
  Administered 2024-06-30 – 2024-07-03 (×3): 17 g via ORAL
  Filled 2024-06-30 (×4): qty 1

## 2024-06-30 MED ORDER — FUROSEMIDE 10 MG/ML IJ SOLN
80.0000 mg | Freq: Once | INTRAMUSCULAR | Status: AC
  Administered 2024-06-30: 80 mg via INTRAVENOUS
  Filled 2024-06-30: qty 8

## 2024-06-30 NOTE — Progress Notes (Signed)
      [address]       [place] [address]             [phone]      POD # 4  Stable day  BP (!) 104/56   Pulse 60   Temp 97.6 F (36.4 C) (Oral)   Resp 19   Ht (P) 5' 2 (1.575 m)   Wt 74.8 kg   SpO2 96%   BMI (P) 30.18 kg/m  Paced   Intake/Output Summary (Last 24 hours) at 06/30/2024 1726 Last data filed at 06/30/2024 1658 Gross per 24 hour  Intake 837 ml  Output 1200 ml  Net -363 ml   UO 610 ml last 10 hours  Hgb 8.4 posttransfusion  Elspeth C. Kerrin, MD [facility] [phone]

## 2024-06-30 NOTE — Progress Notes (Signed)
 [facility] Progress Note   Subjective:   Doing well.  Hiccups and nausea resolved.  No new issues.  UOP yesterday no diuretics. By I/Os net +15L for admission  Objective Vitals:   06/30/24 0700 06/30/24 0800 06/30/24 0900 06/30/24 1000  BP: 119/70 112/63 130/71 (!) 107/59  Pulse: 80 70 69 60  Resp: 17 20 19 16   Temp: 97.6 F (36.4 C)     TempSrc: Axillary     SpO2: 97% 97% 100% 99%  Weight:      Height:       Physical Exam Gen: tired but nontoxic Eyes: EOMI ENT: MMM CV: paced on monitor, temp pacer RIJ central  line Lungs: normal WOB and 100% on 3L [employer], dec BS bases Extr: trace to 1+ diffuse edema, TED hose on Neuro: conversant, nonfocal  Additional Objective Labs: Basic Metabolic Panel: Recent Labs  Lab 06/28/24 0446 06/29/24 0432 06/30/24 0601  NA 136 135 131*  K 4.7 4.4 3.7  CL 103 102 100  CO2 21* 20* 23  GLUCOSE 116* 109* 92  BUN 29* 43* 51*  CREATININE 2.84* 3.58* 2.81*  CALCIUM  7.7* 7.5* 7.3*   Liver Function Tests: No results for input(s): AST, ALT, ALKPHOS, BILITOT, PROT, ALBUMIN  in the last 168 hours. No results for input(s): LIPASE, AMYLASE in the last 168 hours. CBC: Recent Labs  Lab 06/27/24 0427 06/27/24 0843 06/27/24 1652 06/28/24 0446 06/29/24 0432 06/30/24 0601  WBC 17.6*  --  17.3* 17.4* 14.2* 12.7*  HGB 9.7*   < > 8.3* 7.9* 7.3* 6.9*  HCT 28.6*   < > 25.1* 23.9* 22.4* 21.2*  MCV 89.1  --  89.6 90.5 91.1 91.4  PLT 175  --  141* 150 125* 138*   < > = values in this interval not displayed.   Blood Culture    Component Value Date/Time   SDES TISSUE 06/26/2024 1448   SPECREQUEST TRICUSPID VALVE 06/26/2024 1448   CULT  06/26/2024 1448    NO GROWTH 4 DAYS NO ANAEROBES ISOLATED; CULTURE IN PROGRESS FOR 5 DAYS Performed at [facility], [address], [place], [place] [address]    REPTSTATUS PENDING 06/26/2024 1448    Cardiac Enzymes: No results for input(s): CKTOTAL, CKMB,  CKMBINDEX, TROPONINI in the last 168 hours. CBG: Recent Labs  Lab 06/29/24 1114 06/29/24 1531 06/29/24 2151 06/30/24 0638 06/30/24 0758  GLUCAP 104* 98 112* 82 118*   Iron  Studies: No results for input(s): IRON , TIBC, TRANSFERRIN, FERRITIN in the last 72 hours. @lablastinr3 @ Studies/Results: DG Chest Port 1 View Result Date: 06/30/2024 EXAM: 1 VIEW XRAY OF THE CHEST 06/30/2024 05:57:07 AM COMPARISON: 08/31/2023 CLINICAL HISTORY: [id number] S/P AVR 400242. S/P AVR; hx of endocarditis of prosthetic aortic valve FINDINGS: LUNGS AND PLEURA: No pneumothorax is present. Left pleural effusion and basilar airspace opacity is stable. HEART AND MEDIASTINUM: Heart is enlarged. Aortic valve replacement is again noted. Atherosclerotic changes are present at the aortic arch. BONES AND SOFT TISSUES: No acute osseous abnormality. Bilateral chest tubes are stable in position. Right IJ sheath is stable. IMPRESSION: 1. Stable bilateral chest tubes and right IJ sheath. No pneumothorax 2. Stable left pleural effusion and basilar airspace opacity. 3. Enlarged heart with aortic valve replacement and atherosclerotic changes at the aortic arch. Electronically signed by: Lonni Necessary MD 06/30/2024 08:32 AM EDT RP Workstation: [id number]   DG CHEST PORT 1 VIEW Result Date: 06/29/2024 CLINICAL DATA:  [id number]. Encounter for chest tube removal. Status post AVR and CABG.,  tricuspid valve replacement. EXAM: PORTABLE CHEST 1 VIEW COMPARISON:  Portable chest yesterday at 6:09 a.m. FINDINGS: Bilateral chest tubes remain in place with interval removal of mediastinal drain. No measurable pneumothorax. A right IJ catheter introducer sheath again terminates in the upper SVC. Stable CABG change. Metallic AVR is again noted. I do not see the tricuspid valve replacement. Mild stable cardiomegaly. The aorta is tortuous and calcified with stable mediastinum. No vascular congestion is seen. There is patchy consolidation or  atelectasis in the left base, linear atelectatic foci in both mid lung regions. Small pleural effusions. Remaining lungs are clear. Overall aeration seems unchanged. Stable osseous structures. IMPRESSION: 1. Interval removal of mediastinal drain. No measurable pneumothorax. 2. Stable cardiomegaly. 3. Stable patchy consolidation or atelectasis in the left base, stable overall aeration. 4. Small pleural effusions. 5. Aortic atherosclerosis. Electronically Signed   By: Francis Quam M.D.   On: 06/29/2024 07:38   Medications:  chlorproMAZINE (THORAZINE) 12.5 mg in sodium chloride  0.9 % 25 mL IVPB     promethazine (PHENERGAN) injection (IM or IVPB)      sodium chloride    Intravenous Once   acetaminophen   1,000 mg Oral Q6H   Or   acetaminophen  (TYLENOL ) oral liquid 160 mg/5 mL  1,000 mg Per Tube Q6H   aspirin  EC  325 mg Oral Daily   Or   aspirin   324 mg Per Tube Daily   bisacodyl   10 mg Oral Daily   Or   bisacodyl   10 mg Rectal Daily   Chlorhexidine  Gluconate Cloth  6 each Topical Daily   docusate sodium   200 mg Oral Daily   finasteride   5 mg Oral QPM   insulin  aspart  0-9 Units Subcutaneous TID WC   metoCLOPramide  (REGLAN ) injection  5 mg Intravenous Q6H   midodrine   5 mg Oral Q8H   pantoprazole   40 mg Oral Daily   polyethylene glycol  17 g Oral Daily   rosuvastatin   10 mg Oral Daily   sodium chloride  flush  3 mL Intravenous Q12H    A/P: Kyle Matthews is an 78 y.o. male CAD s/p CABG 2024 + AoV replacement, HTN, HL, COPD, BPH s/p prostatectomy currently admitted with endocarditis s/p Bentall procedure, TV replacement, CABG on 7/9 now with AKI.   **AKI, oliguric: normal baseline now with oliguric AKI in the setting of hemodynamic insults during lengthy surgery, poss contribution of high vanc level. Foley  UA with blood, do not suspect GN.  Cr significantly improved from 3.6 to 2.8 today and UOP improved . Fine with lasix  as ordered by cardiology.  Serial labs, strict I/Os, dose meds for crcl,  avoid nephrotoxins and hypotension.     **disseminated MRSA:  endocarditis, CNS septic embolism:  ID following, did 2 weeks vanc (better for CNS penetration with brain emboli), now on dapto. I do not think he has a contraindication for future vanc use should need arise, would just closely monitor levels.   **Hyponatremia: hypervolemic.  Cardiology dosing with diuretics today, agree.   **Anemia:  Hb in the 7-8s, transfusion per primary PRN.    **s/p AoV and TV replacements 7/9   **CHB: EP consulting , currently paced   Nothing further to add, will sign off.  Expect he'll return to normal kidney function, no need for outpt nephrology f/u.   Manuelita Barters MD 06/30/2024, 10:33 AM  [facility] Pager: [phone]

## 2024-06-30 NOTE — Progress Notes (Addendum)
 Pharmacy Antibiotic Note  Kyle Matthews is a 78 y.o. male admitted on 06/12/2024 with MRSE bacteremia. Pt noted to have vegetation on prosthetic aortic valve and tricuspid valve, and also possible CNS emboli.  Pharmacy has been consulted for daptomycin  dosing. Patient is now s/p TVR, CABG and Bentall procedure on 7/9. Noted that patient has an acute AKI post surgery with SCr up to 2.84 today.   VR today returned at 22. Will likely clear to < 20 soon with improving kidney function. Ordered VR with AM labs for tomorrow. Plan to start daptomycin  if < 20.   Plan: Re-check Vancomycin  level 7/14 AM  When level < 20 will plan to start Daptomycin  700 mg every 48 hours Will need weekly CK monitoring once daptomycin  starts Monitor renal function   Height: (P) 5' 2 (157.5 cm) Weight: 74.8 kg (165 lb) IBW/kg (Calculated) : (P) 54.6  Temp (24hrs), Avg:97.8 F (36.6 C), Min:97.3 F (36.3 C), Max:98.3 F (36.8 C)  Recent Labs  Lab 06/27/24 0427 06/27/24 0939 06/27/24 1652 06/28/24 0446 06/28/24 1211 06/29/24 0432 06/30/24 0601  WBC 17.6*  --  17.3* 17.4*  --  14.2* 12.7*  CREATININE 1.59*  --  2.02* 2.84*  --  3.58* 2.81*  VANCOTROUGH  --    < >  --   --  33*  --  22*   < > = values in this interval not displayed.    Estimated Creatinine Clearance: 20.1 mL/min (A) (by C-G formula based on SCr of 2.81 mg/dL (H)).    Allergies  Allergen Reactions   Tetanus Toxoids Other (See Comments)    Childhood Allergy    Zetia  [Ezetimibe ] Diarrhea      Nidia Schaffer, PharmD PGY2 Cardiology Pharmacy Resident  Please check AMION for all [facility] Pharmacy phone numbers After 10:00 PM, call Main Pharmacy [phone] 06/30/2024 3:56 PM

## 2024-06-30 NOTE — Progress Notes (Signed)
 4 Days Post-Op Procedure(s) (LRB): TRICUSPID VALVE REPLACEMENT USING MOSIAC BRIOPROSTHESIS VALVE SIZE (N/A) ECHOCARDIOGRAM, TRANSESOPHAGEAL (N/A) CORONARY ARTERY BYPASS GRAFTING (CABG) TIMES ONE USING ENDOSCOPICALLY HARVESTED LEFT GREATER SAPHENOUS VEIN (N/A) BENTALL PROCEDURE USING KONECT AORTIC VALVE CONDUIT SIZE (N/A) REDO STERNOTOMY (N/A) Subjective: Nausea resolved, denies pain  Objective: Vital signs in last 24 hours: Temp:  [97.6 F (36.4 C)-98.3 F (36.8 C)] 97.6 F (36.4 C) (07/13 0700) Pulse Rate:  [80] 80 (07/13 0700) Cardiac Rhythm: A-V Sequential paced (07/12 2000) Resp:  [10-31] 17 (07/13 0700) BP: (95-133)/(61-79) 119/70 (07/13 0700) SpO2:  [95 %-100 %] 97 % (07/13 0700)  Hemodynamic parameters for last 24 hours:    Intake/Output from previous day: 07/12 0701 - 07/13 0700 In: 723 [P.O.:720; I.V.:3] Out: 1150 [Urine:920; Chest Tube:230] Intake/Output this shift: No intake/output data recorded.  General appearance: alert, cooperative, and no distress Neurologic: intact Heart: regular rate and rhythm Lungs: diminished breath sounds bibasilar Abdomen: normal findings: soft, non-tender  Lab Results: Recent Labs    06/29/24 0432 06/30/24 0601  WBC 14.2* 12.7*  HGB 7.3* 6.9*  HCT 22.4* 21.2*  PLT 125* 138*   BMET:  Recent Labs    06/29/24 0432 06/30/24 0601  NA 135 131*  K 4.4 3.7  CL 102 100  CO2 20* 23  GLUCOSE 109* 92  BUN 43* 51*  CREATININE 3.58* 2.81*  CALCIUM  7.5* 7.3*    PT/INR: No results for input(s): LABPROT, INR in the last 72 hours. ABG    Component Value Date/Time   PHART 7.361 06/27/2024 1259   HCO3 22.9 06/27/2024 1259   TCO2 24 06/27/2024 1259   ACIDBASEDEF 2.0 06/27/2024 1259   O2SAT 77 06/30/2024 0601   CBG (last 3)  Recent Labs    06/29/24 2151 06/30/24 0638 06/30/24 0758  GLUCAP 112* 82 118*    Assessment/Plan: S/P Procedure(s) (LRB): TRICUSPID VALVE REPLACEMENT USING MOSIAC BRIOPROSTHESIS  VALVE SIZE (N/A) ECHOCARDIOGRAM, TRANSESOPHAGEAL (N/A) CORONARY ARTERY BYPASS GRAFTING (CABG) TIMES ONE USING ENDOSCOPICALLY HARVESTED LEFT GREATER SAPHENOUS VEIN (N/A) BENTALL PROCEDURE USING KONECT AORTIC VALVE CONDUIT SIZE (N/A) REDO STERNOTOMY (N/A) POD # 4 NEURO- intact CV- in CHB, pacer dependent  For permanent pacer this week  Remains on midodrine  ID- completed 2 weeks of vancomycin    On hold with high trough level  ID indicated change to daptomycin  but not ordered  Will ask pharmacy to dose RESP- left lower lobe atelectasis continue IS  Dc chest tubes RENAL- Uo increased and creatinine down to 2.8  C/w resolving AKI ENDO- CBG well controlled GI- tolerating diet Dc central line  LOS: 15 days    Kyle Matthews 06/30/2024

## 2024-06-30 NOTE — Progress Notes (Signed)
 Electrophysiology Progress Note  Patient Name: Kyle Matthews Date of Encounter: 06/30/2024  Primary Cardiologist: Dr. Wendel Electrophysiologist: not established   Subjective   Doing Ok -- no acute complaints. Sitting in chair.  Inpatient Medications    Scheduled Meds:  acetaminophen   1,000 mg Oral Q6H   Or   acetaminophen  (TYLENOL ) oral liquid 160 mg/5 mL  1,000 mg Per Tube Q6H   aspirin  EC  325 mg Oral Daily   Or   aspirin   324 mg Per Tube Daily   bisacodyl   10 mg Oral Daily   Or   bisacodyl   10 mg Rectal Daily   Chlorhexidine  Gluconate Cloth  6 each Topical Daily   docusate sodium   200 mg Oral Daily   finasteride   5 mg Oral QPM   insulin  aspart  0-9 Units Subcutaneous TID WC   metoCLOPramide  (REGLAN ) injection  5 mg Intravenous Q6H   midodrine   10 mg Oral Q8H   pantoprazole   40 mg Oral Daily   polyethylene glycol  17 g Oral Daily   rosuvastatin   10 mg Oral Daily   sodium chloride  flush  3 mL Intravenous Q12H   Continuous Infusions:  chlorproMAZINE (THORAZINE) 12.5 mg in sodium chloride  0.9 % 25 mL IVPB     promethazine (PHENERGAN) injection (IM or IVPB)     PRN Meds: chlorproMAZINE (THORAZINE) 12.5 mg in sodium chloride  0.9 % 25 mL IVPB, HYDROmorphone  (DILAUDID ) injection, ondansetron  (ZOFRAN ) IV, mouth rinse, oxyCODONE , promethazine (PHENERGAN) injection (IM or IVPB), sodium chloride  flush, traMADol    Vital Signs    Vitals:   06/30/24 0400 06/30/24 0500 06/30/24 0600 06/30/24 0700  BP: 130/76 95/65 124/75 119/70  Pulse: 80 80 80 80  Resp: 14 12 17 17   Temp:      TempSrc:      SpO2: 97% 98% 97% 97%  Weight:      Height:        Intake/Output Summary (Last 24 hours) at 06/30/2024 0739 Last data filed at 06/30/2024 0700 Gross per 24 hour  Intake 723 ml  Output 1150 ml  Net -427 ml   Filed Weights   06/27/24 0457 06/28/24 0500 06/29/24 0630  Weight: 76.3 kg 73.3 kg 74.8 kg    Physical Exam    GEN- The patient is well appearing, alert and oriented  x 3 today.   Lungs- Clear to ausculation bilaterally, normal work of breathing Heart- Regular rate and rhythm, no murmurs, rubs or gallops Extremities- no clubbing, cyanosis, or edema Neuro- strength and sensation are intact  Labs      Telemetry    A-V paced at 80 bpm (personally reviewed)  Radiology    DG CHEST PORT 1 VIEW Result Date: 06/29/2024 CLINICAL DATA:  [id number]. Encounter for chest tube removal. Status post AVR and CABG., tricuspid valve replacement. EXAM: PORTABLE CHEST 1 VIEW COMPARISON:  Portable chest yesterday at 6:09 a.m. FINDINGS: Bilateral chest tubes remain in place with interval removal of mediastinal drain. No measurable pneumothorax. A right IJ catheter introducer sheath again terminates in the upper SVC. Stable CABG change. Metallic AVR is again noted. I do not see the tricuspid valve replacement. Mild stable cardiomegaly. The aorta is tortuous and calcified with stable mediastinum. No vascular congestion is seen. There is patchy consolidation or atelectasis in the left base, linear atelectatic foci in both mid lung regions. Small pleural effusions. Remaining lungs are clear. Overall aeration seems unchanged. Stable osseous structures. IMPRESSION: 1. Interval removal of mediastinal drain. No measurable pneumothorax. 2.  Stable cardiomegaly. 3. Stable patchy consolidation or atelectasis in the left base, stable overall aeration. 4. Small pleural effusions. 5. Aortic atherosclerosis. Electronically Signed   By: Francis Quam M.D.   On: 06/29/2024 07:38     Patient Profile     Kyle Matthews is a 78 y.o. male with a past medical history significant for severe AS s/p AVR with 23 mm Edwards Inspiris Resilia pericardial valve in 11/24, CAD s/p CABG (LIMA to LAD, SVG to Ramus) at time of AVR, post-op AF, HTN, HLD, hx CVA, COPD, tobacco use, anemia, hx BPH s/p robot-assisted prostatectomy in 06/25 .    He was admitted for bioprosthetic valve endocarditis underwent redo sternotomy  with Bentall procedure and tricuspid valve replacement, with CABG x1. Now with CHB.   Assessment & Plan     Complete heart block Temp pacer AV pacing at 80 bpm Underlying rhythm is sinus with CHB Suspected conduction system resection due to abscess I anticipate pacemaker placement early this week, possibly tomorrow pending lab availability  Staph epidermis endocarditis Leadless pacemaker would be ideal, but may be difficult due to tricuspid replacement, and atrial leadless placement potentially complicated by prior cardiopulmonary bypass  Plans for 6 weeks of vancomysin  CAD S/p CABG 11/24 and 7/09   Eulas Furbish MD 06/30/2024 7:39 AM    For questions or updates, please contact [facility] Please consult [email or web address] for contact info under Cardiology/STEMI.  Signed, Eulas FORBES Furbish, MD  06/30/2024, 7:39 AM

## 2024-06-30 NOTE — Progress Notes (Signed)
 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 Nausea and hiccups resolved.   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 Now AV paced via EPW  Denies SOB. Ambulating unit  Co-ox 77%   Objective:   Weight Range: 74.8 kg Body mass index is 30.18 kg/m (pended).   Vital Signs:   Temp:  [97.6 F (36.4 C)-98.3 F (36.8 C)] 97.6 F (36.4 C) (07/13 0700) Pulse Rate:  [80] 80 (07/13 0700) Resp:  [10-31] 17 (07/13 0700) BP: (95-133)/(61-79) 119/70 (07/13 0700) SpO2:  [95 %-100 %] 97 % (07/13 0700) Last BM Date : 06/25/24  Weight change: Filed Weights   06/27/24 0457 06/28/24 0500 06/29/24 0630  Weight: 76.3 kg 73.3 kg 74.8 kg    Intake/Output:   Intake/Output Summary (Last 24 hours) at 06/30/2024 0904 Last data filed at 06/30/2024 0700 Gross per 24 hour  Intake 603 ml  Output 1050 ml  Net -447 ml      Physical Exam    General:  Sitting up No resp difficulty HEENT: normal Neck: supple. JVP to jaw Carotids 2+ bilat; no bruits. No lymphadenopathy or thryomegaly appreciated. Rnm:duzmwjo wound ok. Regular rate & rhythm. No rubs, gallops or murmurs. Lungs: clear Abdomen: soft, nontender, nondistended. No hepatosplenomegaly. No bruits or masses. Good bowel sounds. Extremities: no cyanosis, clubbing, rash, 1+ edema Neuro: alert & orientedx3, cranial nerves grossly intact. moves all 4 extremities w/o difficulty. Affect pleasant   Telemetry   AV paced 70 (sinus rhythm with CHB no ventricular escape underneath) Personally reviewed   Labs    CBC Recent Labs    06/29/24 0432 06/30/24 0601  WBC 14.2* 12.7*  HGB 7.3* 6.9*  HCT 22.4* 21.2*  MCV 91.1 91.4  PLT 125* 138*   Basic Metabolic  Panel Recent Labs    06/27/24 1652 06/28/24 0446 06/29/24 0432 06/30/24 0601  NA 138   < > 135 131*  K 4.7   < > 4.4 3.7  CL 106   < > 102 100  CO2 21*   < > 20* 23  GLUCOSE 130*   < > 109* 92  BUN 22   < > 43* 51*  CREATININE 2.02*   < > 3.58* 2.81*  CALCIUM  8.0*   < > 7.5* 7.3*  MG 3.6*  --   --   --    < > = values in this interval not displayed.   Liver Function Tests No results for input(s): AST, ALT, ALKPHOS, BILITOT, PROT, ALBUMIN  in the last 72 hours. No results for input(s): LIPASE, AMYLASE in the last 72 hours. Cardiac Enzymes No results for input(s): CKTOTAL, CKMB, CKMBINDEX, TROPONINI in the last 72 hours.  BNP: BNP (last 3 results) Recent Labs    10/03/23 0412 10/04/23 0509 06/12/24 1819  BNP 547.0* 501.3* 169.0*    ProBNP (last 3 results) No results for input(s): PROBNP in the last 8760 hours.   D-Dimer No results for input(s): DDIMER in the last 72 hours. Hemoglobin A1C No results for input(s): HGBA1C in the last 72 hours. Fasting Lipid Panel No results for input(s): CHOL, HDL, LDLCALC, TRIG, CHOLHDL, LDLDIRECT in the last 72 hours. Thyroid  Function Tests No results for input(s): TSH, T4TOTAL, T3FREE, THYROIDAB in  the last 72 hours.  Invalid input(s): FREET3  Other results:   Imaging    DG Chest Port 1 View Result Date: 06/30/2024 EXAM: 1 VIEW XRAY OF THE CHEST 06/30/2024 05:57:07 AM COMPARISON: 08/31/2023 CLINICAL HISTORY: 400242 S/P AVR 400242. S/P AVR; hx of endocarditis of prosthetic aortic valve FINDINGS: LUNGS AND PLEURA: No pneumothorax is present. Left pleural effusion and basilar airspace opacity is stable. HEART AND MEDIASTINUM: Heart is enlarged. Aortic valve replacement is again noted. Atherosclerotic changes are present at the aortic arch. BONES AND SOFT TISSUES: No acute osseous abnormality. Bilateral chest tubes are stable in position. Right IJ sheath is stable. IMPRESSION: 1.  Stable bilateral chest tubes and right IJ sheath. No pneumothorax 2. Stable left pleural effusion and basilar airspace opacity. 3. Enlarged heart with aortic valve replacement and atherosclerotic changes at the aortic arch. Electronically signed by: Lonni Necessary MD 06/30/2024 08:32 AM EDT RP Workstation: [id number]     Medications:     Scheduled Medications:  sodium chloride    Intravenous Once   acetaminophen   1,000 mg Oral Q6H   Or   acetaminophen  (TYLENOL ) oral liquid 160 mg/5 mL  1,000 mg Per Tube Q6H   aspirin  EC  325 mg Oral Daily   Or   aspirin   324 mg Per Tube Daily   bisacodyl   10 mg Oral Daily   Or   bisacodyl   10 mg Rectal Daily   Chlorhexidine  Gluconate Cloth  6 each Topical Daily   docusate sodium   200 mg Oral Daily   finasteride   5 mg Oral QPM   furosemide   80 mg Intravenous Once   insulin  aspart  0-9 Units Subcutaneous TID WC   metoCLOPramide  (REGLAN ) injection  5 mg Intravenous Q6H   midodrine   5 mg Oral Q8H   pantoprazole   40 mg Oral Daily   polyethylene glycol  17 g Oral Daily   rosuvastatin   10 mg Oral Daily   sodium chloride  flush  3 mL Intravenous Q12H    Infusions:  chlorproMAZINE (THORAZINE) 12.5 mg in sodium chloride  0.9 % 25 mL IVPB     promethazine (PHENERGAN) injection (IM or IVPB)      PRN Medications: chlorproMAZINE (THORAZINE) 12.5 mg in sodium chloride  0.9 % 25 mL IVPB, HYDROmorphone  (DILAUDID ) injection, ondansetron  (ZOFRAN ) IV, mouth rinse, oxyCODONE , promethazine (PHENERGAN) injection (IM or IVPB), sodium chloride  flush, traMADol     Patient Profile   78 y.o. male with history of severe AS s/p AVR in 11/24, CAD s/p CABG X 2 in 11/24, BPH s/p robot-assisted prostatectomy. 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 Prolonged pump time. EF preserved on intra-op TEE. - Off pressors/inortopes. On midodrine  for BP support. MAPs 90. Can decrease midodrine  to 5 tid - Volume overloaded. Will start lasix  80IV daily and assess response (being cautious with recent AKI)   2. CAD -S/p CABG X 2 (LIMA to LAD, SVG to Ramus) 11/24 -S/p CABG X 1 (SVG to RCA) 07/09 - No s/s angina -Continue aspirin  + statin   3. AKI -suspect 2/2 prolonged pump run -Scr 1>1.6>2.8-> 3.6 -> 2.8 -Keep MAP > 70 for renal perfusion -Nephrology consulted - Improving today - Start IV lasix  as above  4. Acute CVA -Small left occipital infarct on MRI -Suspect d/t  septic emboli   5. Post-op blood loss anemia - hgb 6.9 - transfuse per TCTS  6. CHB  - conduction system likely resected during surgery due to abscess - pacer dependent - will need PPM prior to d/c - EP has seen   Length of Stay: 15  Toribio Fuel, MD  06/30/2024, 9:04 AM  Advanced Heart Failure Team Pager [phone] (M-F; 7a - 5p)  Please contact [facility] for night-coverage after hours (5p -7a ) and weekends on [email or web address]

## 2024-07-01 ENCOUNTER — Inpatient Hospital Stay (HOSPITAL_COMMUNITY)
Admission: EM | Disposition: A | Discharge status: Home Health | Payer: Self-pay | Source: Other Acute Inpatient Hospital | Attending: Surgery

## 2024-07-01 DIAGNOSIS — Z952 Presence of prosthetic heart valve: Secondary | ICD-10-CM

## 2024-07-01 DIAGNOSIS — I442 Atrioventricular block, complete: Secondary | ICD-10-CM | POA: Diagnosis not present

## 2024-07-01 DIAGNOSIS — I669 Occlusion and stenosis of unspecified cerebral artery: Secondary | ICD-10-CM | POA: Diagnosis not present

## 2024-07-01 DIAGNOSIS — Z954 Presence of other heart-valve replacement: Secondary | ICD-10-CM

## 2024-07-01 DIAGNOSIS — T826XXA Infection and inflammatory reaction due to cardiac valve prosthesis, initial encounter: Secondary | ICD-10-CM | POA: Diagnosis not present

## 2024-07-01 DIAGNOSIS — I330 Acute and subacute infective endocarditis: Secondary | ICD-10-CM | POA: Diagnosis not present

## 2024-07-01 DIAGNOSIS — Z951 Presence of aortocoronary bypass graft: Secondary | ICD-10-CM

## 2024-07-01 DIAGNOSIS — B9562 Methicillin resistant Staphylococcus aureus infection as the cause of diseases classified elsewhere: Secondary | ICD-10-CM

## 2024-07-01 HISTORY — PX: PACEMAKER IMPLANT: EP1218

## 2024-07-01 LAB — BPAM RBC
Blood Product Expiration Date: 202507152359
ISSUE DATE / TIME: 202507131203
Unit Type and Rh: 8400

## 2024-07-01 LAB — VANCOMYCIN, RANDOM: Vancomycin Rm: 16 ug/mL

## 2024-07-01 LAB — BASIC METABOLIC PANEL WITH GFR
Anion gap: 8 (ref 5–15)
BUN: 55 mg/dL — ABNORMAL HIGH (ref 8–23)
CO2: 23 mmol/L (ref 22–32)
Calcium: 7.4 mg/dL — ABNORMAL LOW (ref 8.9–10.3)
Chloride: 102 mmol/L (ref 98–111)
Creatinine, Ser: 2.13 mg/dL — ABNORMAL HIGH (ref 0.61–1.24)
GFR, Estimated: 32 mL/min — ABNORMAL LOW (ref >60)
Glucose, Bld: 104 mg/dL — ABNORMAL HIGH (ref 70–99)
Potassium: 3.6 mmol/L (ref 3.5–5.1)
Sodium: 133 mmol/L — ABNORMAL LOW (ref 135–145)

## 2024-07-01 LAB — CBC
HCT: 24.7 % — ABNORMAL LOW (ref 39.0–52.0)
Hemoglobin: 8.2 g/dL — ABNORMAL LOW (ref 13.0–17.0)
MCH: 29.5 pg (ref 26.0–34.0)
MCHC: 33.2 g/dL (ref 30.0–36.0)
MCV: 88.8 fL (ref 80.0–100.0)
Platelets: 134 K/uL — ABNORMAL LOW (ref 150–400)
RBC: 2.78 MIL/uL — ABNORMAL LOW (ref 4.22–5.81)
RDW: 18.2 % — ABNORMAL HIGH (ref 11.5–15.5)
WBC: 9 K/uL (ref 4.0–10.5)
nRBC: 0.4 % — ABNORMAL HIGH (ref 0.0–0.2)

## 2024-07-01 LAB — TYPE AND SCREEN
ABO/RH(D): AB POS
Antibody Screen: NEGATIVE
Unit division: 0

## 2024-07-01 LAB — AEROBIC/ANAEROBIC CULTURE W GRAM STAIN (SURGICAL/DEEP WOUND)
Culture: NO GROWTH
Culture: NO GROWTH
Culture: NO GROWTH
Gram Stain: NONE SEEN

## 2024-07-01 LAB — GLUCOSE, CAPILLARY
Glucose-Capillary: 120 mg/dL — ABNORMAL HIGH (ref 70–99)
Glucose-Capillary: 85 mg/dL (ref 70–99)
Glucose-Capillary: 89 mg/dL (ref 70–99)

## 2024-07-01 SURGERY — PACEMAKER IMPLANT
Anesthesia: LOCAL

## 2024-07-01 MED ORDER — DAPTOMYCIN-SODIUM CHLORIDE 700-0.9 MG/100ML-% IV SOLN
700.0000 mg | INTRAVENOUS | Status: DC
  Administered 2024-07-01: 700 mg via INTRAVENOUS
  Filled 2024-07-01: qty 100

## 2024-07-01 MED ORDER — CEFAZOLIN SODIUM-DEXTROSE 2-4 GM/100ML-% IV SOLN
INTRAVENOUS | Status: AC
  Filled 2024-07-01: qty 100

## 2024-07-01 MED ORDER — SODIUM CHLORIDE 0.9 % IV SOLN: INTRAVENOUS | Status: DC

## 2024-07-01 MED ORDER — FENTANYL CITRATE (PF) 100 MCG/2ML IJ SOLN
INTRAMUSCULAR | Status: AC
  Filled 2024-07-01: qty 2

## 2024-07-01 MED ORDER — CHLORHEXIDINE GLUCONATE 4 % EX SOLN
60.0000 mL | Freq: Once | CUTANEOUS | Status: AC
  Administered 2024-07-01: 4 via TOPICAL

## 2024-07-01 MED ORDER — DAPTOMYCIN IV (FOR PTA / DISCHARGE USE ONLY): 700.0000 mg | INTRAVENOUS | 0 refills | Status: DC

## 2024-07-01 MED ORDER — FENTANYL CITRATE (PF) 100 MCG/2ML IJ SOLN
INTRAMUSCULAR | Status: DC | PRN
  Administered 2024-07-01 (×2): 25 ug via INTRAVENOUS

## 2024-07-01 MED ORDER — HEPARIN (PORCINE) IN NACL 1000-0.9 UT/500ML-% IV SOLN
INTRAVENOUS | Status: DC | PRN
  Administered 2024-07-01: 500 mL

## 2024-07-01 MED ORDER — CEFAZOLIN SODIUM-DEXTROSE 2-4 GM/100ML-% IV SOLN
2.0000 g | INTRAVENOUS | Status: AC
  Administered 2024-07-01: 2 g via INTRAVENOUS
  Filled 2024-07-01: qty 100

## 2024-07-01 MED ORDER — IOHEXOL 350 MG/ML SOLN
INTRAVENOUS | Status: DC | PRN
Start: 2024-07-01 — End: 2024-07-01
  Administered 2024-07-01: 5 mL

## 2024-07-01 MED ORDER — ACETAMINOPHEN 325 MG PO TABS: 325.0000 mg | ORAL_TABLET | ORAL | Status: DC | PRN

## 2024-07-01 MED ORDER — MIDAZOLAM HCL 2 MG/2ML IJ SOLN
INTRAMUSCULAR | Status: AC
  Filled 2024-07-01: qty 2

## 2024-07-01 MED ORDER — CHLORHEXIDINE GLUCONATE 4 % EX SOLN
60.0000 mL | Freq: Once | CUTANEOUS | Status: AC
  Filled 2024-07-01: qty 60

## 2024-07-01 MED ORDER — SODIUM CHLORIDE 0.9 % IV SOLN
80.0000 mg | INTRAVENOUS | Status: AC
  Administered 2024-07-01: 80 mg
  Filled 2024-07-01: qty 2

## 2024-07-01 MED ORDER — LIDOCAINE HCL (PF) 1 % IJ SOLN
INTRAMUSCULAR | Status: DC | PRN
  Administered 2024-07-01: 60 mL via INTRADERMAL

## 2024-07-01 MED ORDER — MIDAZOLAM HCL 5 MG/5ML IJ SOLN
INTRAMUSCULAR | Status: DC | PRN
Start: 2024-07-01 — End: 2024-07-01
  Administered 2024-07-01 (×2): 1 mg via INTRAVENOUS

## 2024-07-01 MED ORDER — LIDOCAINE HCL 1 % IJ SOLN
INTRAMUSCULAR | Status: AC
  Filled 2024-07-01: qty 60

## 2024-07-01 MED ORDER — CHLORHEXIDINE GLUCONATE 4 % EX SOLN
CUTANEOUS | Status: AC
  Administered 2024-07-01: 4 via TOPICAL
  Filled 2024-07-01: qty 15

## 2024-07-01 MED ORDER — SODIUM CHLORIDE 0.9 % IV SOLN
INTRAVENOUS | Status: AC
  Filled 2024-07-01: qty 2

## 2024-07-01 SURGICAL SUPPLY — 14 items
CABLE SURGICAL S-101-97-12 (CABLE) ×2 IMPLANT
CATH CPS LOCATOR 3D MED (CATHETERS) IMPLANT
KIT ESSENTIALS PG (KITS) IMPLANT
LEAD QUARTET 1458Q-86CM (Lead) IMPLANT
LEAD ULTIPACE 52 LPA1231/52 (Lead) IMPLANT
PACEMAKER QUDR ALLR CRT PM3562 (Pacemaker) IMPLANT
PAD DEFIB RADIO PHYSIO CONN (PAD) ×2 IMPLANT
SHEATH 7FR PRELUDE SNAP 13 (SHEATH) IMPLANT
SHEATH 9FR PRELUDE SNAP 13 (SHEATH) IMPLANT
SHEATH PROBE COVER 6X72 (BAG) IMPLANT
SLITTER AGILIS HISPRO (INSTRUMENTS) IMPLANT
TRAY PACEMAKER INSERTION (PACKS) ×2 IMPLANT
WIRE ACUITY WHISPER EDS 4648 (WIRE) IMPLANT
WIRE HI TORQ VERSACORE-J 145CM (WIRE) IMPLANT

## 2024-07-01 NOTE — Progress Notes (Signed)
   07/01/24 1437  Spiritual Encounters  Type of Visit Initial  Care provided to: Pt and family  Referral source Clinical staff  Reason for visit Advance directives  OnCall Visit No  Spiritual Framework  Presenting Themes Significant life change  Community/Connection Family  Patient Stress Factors Health changes  Interventions  Spiritual Care Interventions Made Established relationship of care and support;Encouragement  Intervention Outcomes  Outcomes Awareness of support;Awareness of health;Reduced anxiety  Mental Health Advance Directives  Does Patient Have a Mental Health Advance Directive? No  Would patient like information on creating a mental health advance directive? No - Patient declined   Chaplain met with Pt to discuss the Advance Directive (A.D). Pt stated that his wife is his designated POA and, at this time, there is no nee to complete (AD). Pt.affirmed that his wife is the person he trusts to make healthcare decisions on his behalf. (Couple been married 56 years).

## 2024-07-01 NOTE — Progress Notes (Addendum)
      [address]       [place] [address]             [phone]      5 Days Post-Op Procedure(s) (LRB): TRICUSPID VALVE REPLACEMENT USING MOSIAC BRIOPROSTHESIS VALVE SIZE (N/A) ECHOCARDIOGRAM, TRANSESOPHAGEAL (N/A) CORONARY ARTERY BYPASS GRAFTING (CABG) TIMES ONE USING ENDOSCOPICALLY HARVESTED LEFT GREATER SAPHENOUS VEIN (N/A) BENTALL PROCEDURE USING KONECT AORTIC VALVE CONDUIT SIZE (N/A) REDO STERNOTOMY (N/A)  Subjective:  Patient sitting up in chair.. Overall doing well.  Pain is well controlled. Denies further N/V.  No BM yet, but thinking it will be today.  Objective: Vital signs in last 24 hours: Temp:  [97.3 F (36.3 C)-98 F (36.7 C)] 97.8 F (36.6 C) (07/14 0500) Pulse Rate:  [59-70] 60 (07/14 0600) Cardiac Rhythm: A-V Sequential paced (07/13 2000) Resp:  [11-20] 16 (07/14 0600) BP: (81-130)/(49-71) 98/60 (07/14 0600) SpO2:  [95 %-100 %] 97 % (07/14 0600)  Intake/Output from previous day: 07/13 0701 - 07/14 0700 In: 1197 [P.O.:960; I.V.:3; Blood:234] Out: 1110 [Urine:1110]  General appearance: alert, cooperative, and no distress Heart: regular rate and rhythm Lungs: clear to auscultation bilaterally Abdomen: soft, non-tender; bowel sounds normal; no masses,  no organomegaly Extremities: edema trace Wound: clean and dry  Lab Results: Recent Labs    06/30/24 0601 06/30/24 1626 07/01/24 0431  WBC 12.7*  --  9.0  HGB 6.9* 8.4* 8.2*  HCT 21.2* 25.5* 24.7*  PLT 138*  --  134*   BMET:  Recent Labs    06/30/24 0601 07/01/24 0431  NA 131* 133*  K 3.7 3.6  CL 100 102  CO2 23 23  GLUCOSE 92 104*  BUN 51* 55*  CREATININE 2.81* 2.13*  CALCIUM  7.3* 7.4*    PT/INR: No results for input(s): LABPROT, INR in the last 72 hours. ABG    Component Value Date/Time   PHART 7.361 06/27/2024 1259   HCO3 22.9 06/27/2024 1259   TCO2 24 06/27/2024 1259   ACIDBASEDEF 2.0 06/27/2024 1259   O2SAT 77 06/30/2024 0601   CBG (last  3)  Recent Labs    06/30/24 1157 06/30/24 1618 06/30/24 2307  GLUCAP 100* 128* 95    Assessment/Plan: S/P Procedure(s) (LRB): TRICUSPID VALVE REPLACEMENT USING MOSIAC BRIOPROSTHESIS VALVE SIZE (N/A) ECHOCARDIOGRAM, TRANSESOPHAGEAL (N/A) CORONARY ARTERY BYPASS GRAFTING (CABG) TIMES ONE USING ENDOSCOPICALLY HARVESTED LEFT GREATER SAPHENOUS VEIN (N/A) BENTALL PROCEDURE USING KONECT AORTIC VALVE CONDUIT SIZE (N/A) REDO STERNOTOMY (N/A)  CV- CHB, back up pacer set.. EP is following, planning for PPM placement today.. BP remains low, on midodrine  5 mg TID Pulm- weaning oxygen as tolerated, on 2L @ 97% Renal- AKI, resolving, creatinine at 2.18.. foley in place for accurate I/O measurement Expected post operative blood loss anemia, not clinically significant.. monitor H/O BPH/Prostatectomy ID- Endocarditis- ABX per ID, OR cultures negative to date Deconditioning- PT/OT no recs Dispo- patient doing well, EP to place PPM today, renal function improving.. possibly to 4E tomorrow   LOS: 16 days    Rocky Shad, PA-C 07/01/2024  Agree with above Awaiting PPM Continue ICU care  Hephzibah Strehle O Nikolai Wilczak

## 2024-07-01 NOTE — Progress Notes (Signed)
 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   IV antibiotic discharge orders are pended. To discharging provider:  please sign these orders via discharge navigator,  Select New Orders & click on the button choice - Manage This Unsigned Work.     Thank you for allowing pharmacy to be a part of this patient's care.  Damien Quiet, PharmD, BCPS, BCIDP Infectious Diseases Clinical Pharmacist Phone: [phone] 07/01/2024, 11:27 AM

## 2024-07-01 NOTE — Progress Notes (Addendum)
 Patient Name: Kyle Matthews Date of Encounter: 07/01/2024  Primary Cardiologist: Arun K Thukkani, MD Electrophysiologist: None  Interval Summary   RN reports the patient walked this am, intermittent AV pacing overnight.  Pt reports he felt well with walking.  States it is the best he has felt walking.   Vital Signs    Vitals:   07/01/24 0200 07/01/24 0300 07/01/24 0400 07/01/24 0500  BP: (!) 92/52 (!) 96/54 127/64 114/62  Pulse: 60 60 60 60  Resp: 13 13 14 15   Temp:    97.8 F (36.6 C)  TempSrc:    Oral  SpO2: 97% 97% 97% 97%  Weight:      Height:        Intake/Output Summary (Last 24 hours) at 07/01/2024 0641 Last data filed at 07/01/2024 0500 Gross per 24 hour  Intake 1197 ml  Output 1130 ml  Net 67 ml   Filed Weights   06/28/24 0500 06/29/24 0630 06/30/24 0500  Weight: 73.3 kg 74.8 kg 73.6 kg    Physical Exam    GEN- pleasant adult male, post-surgical in NAD, alert and oriented  Lungs- non-labored at rest, clear anterior, diminished posterior lower Cardiac- Regular rate and rhythm (VP), no murmurs, rubs or gallops GI- soft, NT, ND, + BS Extremities- no clubbing or cyanosis. No edema  Telemetry    Largely VP 60's, some intermittent AV pacing (personally reviewed)  Hospital Course    Kyle Matthews is a 78 y.o. male with PMH of severe AS s/p AVR with 23 mm Edwards Inspiris Resilia pericardial valve in 11/24, CAD s/p CABG (LIMA to LAD, SVG to Ramus) at time of AVR, post-op AF, HTN, HLD, CVA, COPD, tobacco use, anemia, BPH s/p robot-assisted prostatectomy in 06/25.   Admitted 06/12/24 for bioprosthetic valve endocarditis. MRI of brain on initial work up showed L occipital lobe infarct.  He underwent redo sternotomy with Bentall procedure and tricuspid valve replacement (27 mm MDT Mosaic porcine valve), with CABG x1. 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  Assessment & Plan    Complete Heart Block  -s/p TVP, DDD 60 bpm. LOC at 5mA in V  -underlying rhythm CHB     -NPO after breakfast for possible PPM implant, Kyle Matthews determine lab availability with MD  -reviewed pacing with Dr. Kerrin, ok to place RV lead if necessary  Staph Epidermis Endocarditis  -6 weeks vancomycin  per ID  -follow surgical cultures  CAD  VHD s/p AVR & TVR S/p CABG 10/2023 and 06/26/24  -post operative care per TCTS     For questions or updates, please contact [facility] Please consult [email or web address] for contact info under     Signed,  Daphne Barrack, NP-C, AGACNP-BC [facility] - Electrophysiology  07/01/2024, 6:41 AM   I have seen and examined this patient with Daphne Barrack.  Agree with above, note added to reflect my findings.  Patient feeling well without complaint.  Remains dependent on epicardial leads.  GEN: No acute distress.   Neck: No JVD Cardiac: RRR, no murmurs, rubs, or gallops.  Respiratory: normal BS bases bilaterally. GI: Soft, nontender, non-distended  MS: No edema; No deformity. Neuro:  Nonfocal  Skin: warm and dry Psych: Normal affect    Complete heart block: Occurred after aortic and tricuspid valve surgery.  He Kyle Matthews need pacemaker implant.  Risks and benefits have been discussed.  He understands the risks and is agreed to the procedure. Staph epidermidis endocarditis: Vancomycin  per infectious disease Coronary artery disease: No chest  pain Valvular heart disease: Post AVR and TVR.  Plan per cardiac surgery  Kyle Hollenbeck M. Zurich Carreno MD 07/01/2024 12:41 PM

## 2024-07-01 NOTE — Discharge Summary (Signed)
 [address] [place] [address]             [phone]        Physician Discharge Summary  Patient ID: Kyle Matthews MRN: [id number] DOB/AGE: 76/08/1946 76 y.o.  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   Acute stroke due to ischemia (HCC) 06/18/2024   Acute embolic stroke (HCC) 06/15/2024   Bacteremia 06/14/2024   SIRS (systemic inflammatory response syndrome) (HCC) 06/13/2024   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   S/P AVR (aortic valve replacement) 11/02/2023   Chronic heart failure with preserved ejection fraction (HFpEF) (HCC) 11/01/2023   Acute blood loss anemia 10/30/2023   Benign hypertension 10/30/2023   Acute heart failure with preserved ejection fraction (HFpEF) (HCC) 10/28/2023   Atherosclerosis of native coronary artery of native heart without angina pectoris 10/28/2023   Pressure injury of skin 10/28/2023   Chest pain 10/23/2023   History of essential hypertension 10/23/2023   GERD (gastroesophageal reflux disease) 10/23/2023   NSTEMI (non-ST elevated myocardial infarction) (HCC) 10/11/2023   Aortic valve disease 10/11/2023   Coronary artery disease involving native coronary artery of native heart with unstable angina pectoris (HCC) 10/10/2023   Chronic blood loss anemia 10/10/2023   Gross hematuria 10/07/2023   BPH with urinary obstruction 10/04/2023   Acute on chronic diastolic CHF (congestive heart failure) (HCC) 10/04/2023   Symptomatic anemia 10/03/2023   Kidney lesion, native, left 10/03/2023   Acute respiratory failure with hypoxia (HCC) 10/03/2023   Lesion of right native kidney 10/03/2023   Elevated  brain natriuretic peptide (BNP) level 10/03/2023   Coronary artery disease 10/03/2023   SOB (shortness of breath) 10/03/2023   Bladder outlet obstruction 10/03/2023   Chronic indwelling Foley catheter 10/03/2023   Hematuria 10/03/2023   Enlarged prostate 10/03/2023   Moderate aortic regurgitation 10/03/2023   Hyperlipidemia 05/20/2017   Nicotine dependence, cigarettes, uncomplicated 05/20/2017   Carotid bruit 05/19/2017   Essential hypertension    RBBB 04/13/2017   Severe aortic stenosis 04/13/2017   Discharge Diagnoses:  Patient Active Problem List   Diagnosis Date Noted   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   S/P Bentall Procedure 07/01/2024   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   Congestive heart failure (HCC) 06/18/2024   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   Acute stroke due to ischemia (HCC) 06/18/2024   Acute embolic stroke (HCC) 06/15/2024   Bacteremia 06/14/2024   SIRS (systemic inflammatory response syndrome) (HCC) 06/13/2024   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   S/P AVR (aortic valve replacement) 11/02/2023   Chronic heart failure with preserved ejection fraction (HFpEF) (HCC) 11/01/2023   Acute blood loss anemia 10/30/2023   Benign hypertension 10/30/2023   Acute heart failure with preserved ejection fraction (HFpEF) (HCC) 10/28/2023   Atherosclerosis of native coronary artery of native heart without angina pectoris 10/28/2023   Pressure injury of skin 10/28/2023   Chest pain 10/23/2023   History of essential hypertension 10/23/2023   GERD (gastroesophageal reflux disease) 10/23/2023   NSTEMI (non-ST  elevated myocardial infarction) (HCC) 10/11/2023   Aortic valve disease 10/11/2023   Coronary artery disease involving native coronary artery of native heart with unstable angina pectoris (HCC) 10/10/2023    Chronic blood loss anemia 10/10/2023   Gross hematuria 10/07/2023   BPH with urinary obstruction 10/04/2023   Acute on chronic diastolic CHF (congestive heart failure) (HCC) 10/04/2023   Symptomatic anemia 10/03/2023   Kidney lesion, native, left 10/03/2023   Acute respiratory failure with hypoxia (HCC) 10/03/2023   Lesion of right native kidney 10/03/2023   Elevated brain natriuretic peptide (BNP) level 10/03/2023   Coronary artery disease 10/03/2023   SOB (shortness of breath) 10/03/2023   Bladder outlet obstruction 10/03/2023   Chronic indwelling Foley catheter 10/03/2023   Hematuria 10/03/2023   Enlarged prostate 10/03/2023   Moderate aortic regurgitation 10/03/2023   Hyperlipidemia 05/20/2017   Nicotine dependence, cigarettes, uncomplicated 05/20/2017   Carotid bruit 05/19/2017   Essential hypertension    RBBB 04/13/2017   Severe aortic stenosis 04/13/2017   Discharged Condition: good  Referring:  Will Cushing, MD Primary Care: Verena Mems, MD Primary Cardiologist:  Lurena MARLA Red, MD  History of Present Illness: 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  He is status post aortic valve replacement with a 23 mm Inspiris bioprosthetic valve along with two-vessel coronary bypass grafting with the left internal mammary artery grafted to the left anterior descending coronary artery and saphenous vein grafted to the ramus intermediate coronary artery by Dr. Lucas in November 2024.  He recently underwent robot-assisted prostatectomy by Dr. Patrcia on 05/22/2024.  Prior to that, he had a Foley catheter in place continuously for about 9 months due to frequent hematuria related to his prostate disease.    [name] was brought to the emergency room at [facility] via EMS on 06/12/2024 with primary  complaint of midsternal chest pain and chills.  [name] reported his chest pain had been present for about 2 weeks, was unrelated to exertion and would last for several hours and then resolve spontaneously.  He also noted lower extremity edema about 2 weeks duration and frequent recurring chills with episodes lasting 15 to 20 minutes.    Workup in the emergency room included an initial troponin level of 99 with repeat level of 86.  CTA chest was negative for pulmonary embolus.  EKG did not show any acute ischemic changes.  He was admitted to the hospital and cardiology was consulted.  Mr. Sidor was seen by Dr. Alvan and echocardiography was recommended.  In the interim, an EEG was also performed to rule out seizure activity as a cause for the patient's shaking episodes. He went on to have an MRI showing a small acute CVA in the left occipital lobe with a concurrent MRA showing no associated vascular occlusion.  By the second hospital day, blood cultures that had been drawn  on admission resulted positive for Staphylococcus epidermidis.  The transthoracic echocardiogram showed elevated gradients across the prosthetic aortic valve and thus transesophageal echo was recommended.  The TEE was performed earlier today by Dr. Oneil Parchment showing left ventricular ejection fraction of 65 to 70%.  The mitral valve had mild annular calcification but no MR.  The prosthetic aortic valve had a 1.15 x 1.29 x 1.66 cm mobile vegetation with a mean aortic valve gradient of 20 mmHg.  The tricuspid  valve had a mobile density measuring 1.55 x 0.45 cm with moderate TR.   Addendum per Dr. 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 He had an indwelling foley for 9 months which was just removed a couple weeks ago after prostatectomy. MRI brain showed a small acute occipital infarct with no deficit. He has clinically responded to antibiotics with no signs of sepsis and  last set of BC from 6/29 are negative after + BC on 6/25 and 6/27. He has mobile vegetation on the aortic valve measured at 1.15 x 1.29 cm with an increase in the valve gradient from 12 postop to 28 on 2D echo and 20 by TEE. No sign of perivalvular abscess. No AI, moderate TR with mobile vegetation. No sign of PE or septic pulmonary emboli on CT. He looks good clinically. I think there are certainly multiple indications for redo AVR in this pt including likely embolic stroke with mobile vegetation > 1cm, prosthetic valve infection with resistant bacteria, increased valve gradient. His operative risk is certainly increased at 77 but still fairly low and I think his risk for further complications with only antibiotic therapy are higher and long term results less predictable. Tricuspid valve may also require replacement depending on intra-op findings. He would require preop cath. I discussed all of this with him and he is going to discuss with his wife. I will check back with him tomorrow to see what other questions come up.   Hospital Course: Mr. Maalouf was treated with IV vancomycin  for methicillin staph epidermidis and he remained stable clinically. Repeat blood cultures on 06/16/24 had no growth.  Left heart catheterization was performed showing all coronary grafts to be patent and no new coronary stenoses. He was taken to the OR electively on 06/26/24 for re-do sternotomy. The patient was discovered to have an aortic subvavlular abscess and significant destruction of the septal leaflet of the tricuspid valve. This required aortic root replacement with a 21mm Konect bioprosthetic valved conduit, tricuspid valve replacement with a 27mm Mosaic bioprosthetic valve, and single-vessel coronary artery bypass grafting (SVG->distal RCA).  Following procedure, he separated from cardiopulmonary bypass without difficulty and is was reasonably hemostatic.  He was transferred to the surgical ICU in stable condition.  He was  maintained on the ventilator overnight for stability.  He remained hemodynamically stable on low-dose Neo-Synephrine.  On postop day 1 the vent was weaned and he was extubated at around 9 AM.  He was started on midodrine  on the following day to aid with weaning of the Neo-Synephrine.  He had expected complete heart block after surgery due to the extensive bradycardia required for both the aortic and tricuspid valve replacements.  We were able to maintain the rhythm was pacing utilizing the epicardial pacing leads.  The EP service was consulted for eventual placement of a permanent pacemaker.  He developed an acute kidney injury after surgery with oliguria.  The nephrology team was consulted.  He was given a Lasix  challenge 80 mg IV with about 400 mL of urine.  By postop day 4, urine output was continuing to show improvement and creatinine was trending down after peak creatinine of 3.6 on postop day 3.  Patient was followed by advanced heart failure team and infectious disease after surgery.  He was maintained on vancomycin  early postoperatively but transitioned to daptomycin  per ID recommendations. He continued to have low blood pressure requiring Midodrine , however this improved and dose was taper  and discontinued as tolerated.  The patient underwent placement of PPM on 7/14.  This was functioning appropriately.  His pacing wires were removed without difficulty.  His urinary output was good and with improvement of creatinine level.  His foley catheter was removed without difficulty.  He was stable for transfer to the progressive care unit on 7/15.  The patient remains V paced.  He will require PICC line placement prior to discharge.  His creatinine level continued to improve back to normal.  He was started on Lasix , potassium to help facilitate diuresis.  He developed hypertension and was resumed on his home Norvasc . His surgical incisions are healing without evidence of infection.  He is ambulating without  difficulty.  He is felt to be medically stable for discharge home today.  Consults: cardiology, ID, and nephrology  Significant Diagnostic Studies: TEE  Patient Name:   MAURIZIO GENO Date of Exam: 06/18/2024  Medical Rec #:  [id number]  Height:       62.0 in  Accession #:    [id number] Weight:       153.2 lb  Date of Birth:  1946/10/18   BSA:          1.707 m  Patient Age:    77 years   BP:           118/60 mmHg  Patient Gender: M          HR:           94 bpm.  Exam Location:  Inpatient   Procedure: 3D Echo, Transesophageal Echo, Cardiac Doppler and Color  Doppler            (Both Spectral and Color Flow Doppler were utilized during             procedure).   Indications:     Endocarditis    History:         Patient has prior history of Echocardiogram examinations,  most                  recent 06/13/2024. AVR Inspiris Resilia prosthesis  11/02/23.                  Aortic Valve: unknown Inspiris Resilia valve is present  in the                   aortic position. Procedure Date: 11/02/23.    Sonographer:     Tinnie Gosling RDCS  Referring Phys:  [id number] LORETTE CINDERELLA KAPUR  Diagnosing Phys: Oneil Parchment MD   PROCEDURE: TEE procedure time was 30 minutes. The transesophogeal probe  was passed without difficulty through the esophogus of the patient. Imaged  were obtained with the patient in a left lateral decubitus position.  Sedation performed by different  physician. Image quality was good. The patient's vital signs; including  heart rate, blood pressure, and oxygen saturation; remained stable  throughout the procedure. The patient developed no complications during  the procedure.    IMPRESSIONS     1. Left ventricular ejection fraction, by estimation, is 65 to 70%. The  left ventricle has normal function. The left ventricle has no regional  wall motion abnormalities.   2. Right ventricular systolic function is normal. The right ventricular  size is normal.   3. No left  atrial/left atrial appendage thrombus was detected.   4. The mitral valve is normal in structure. No evidence of mitral valve  regurgitation. No evidence of mitral stenosis.  5. Mobile density on TV measuring 1.55cm x 0.447cm - Tricuspid valve  endocarditis. The tricuspid valve is myxomatous. Tricuspid valve  regurgitation is moderate.   6. Mobile mass measuring 1.15cm x 1.29cm with a 1.66 cm mobile tail -  bioprosthetic valve endocarditis. The aortic valve has been  repaired/replaced. Aortic valve regurgitation is trivial. No aortic  stenosis is present. There is a unknown Inspiris  Resilia valve present in the aortic position. Procedure Date: 11/02/23.  Aortic valve mean gradient measures 20.0 mmHg. Aortic valve Vmax measures  2.90 m/s.   7. The inferior vena cava is normal in size with greater than 50%  respiratory variability, suggesting right atrial pressure of 3 mmHg.   8. 3D performed of the aortic valve and demonstrates AV vegetation.   Treatments: surgery:    CARDIOVASCULAR SURGERY OPERATIVE NOTE   06/26/2024   Surgeon:  Dorise LOIS Fellers, MD   First Assistant: Laurel Becket,  PA-C and Con Helm, PA-C: An experienced assistant was required given the complexity of this surgery and the standard of surgical care. The assistant was needed for exposure, dissection, suctioning, retraction of delicate tissues and sutures, instrument exchange and for overall help during this procedure.      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     Procedure:   Redo Median Sternotomy Extracorporeal circulation 3.   Bentall Procedure using a 21 mm KONECT RESILIA pericardial valved conduit. Reimplantation of left coronary artery and  ligation of right coronary artery. 4.   CABG x 1 using SVG to RCA. 5.   Tricuspid valve replacement using a 27 mm Medtronic Mosaic porcine valve.   Anesthesia:  General Endotracheal       Discharge Exam: Blood pressure (!) 150/72, pulse 70, temperature 98.4 F (36.9 C), temperature source Oral, resp. rate 17, height (P) 5' 2 (1.575 m), weight 72.5 kg, SpO2 93%.  General appearance: alert, cooperative, and no distress Heart: regular rate and rhythm and paced Lungs: diminished breath sounds bibasilar Abdomen: soft, non-tender; bowel sounds normal; no masses,  no organomegaly Extremities: edema minimal Wound: clean and dry  Discharge Medications:  The patient has been discharged on:   1.Beta Blocker:  Yes [   ]                              No   [ X  ]                              If No, reason: CHB  2.Ace Inhibitor/ARB: Yes [   ]                                     No  [  X  ]                                     If No, reason: AKI  3.Statin:   Yes [ X  ]                  No  [   ]  If No, reason:  4.Ecasa:  Yes  [  X ]                  No   [   ]                  If No, reason:  Patient had ACS upon admission: No   Plavix /P2Y12 inhibitor: Yes [   ]                                      No  [ X  ]  Discharge Instructions     [facility] pharmacist to adjust dose for Vancomycin , Aminoglycosides and other anti-infective therapies as requested by physician.   Complete by: As directed    [facility] to provide Cath Flo 2mg    Complete by: As directed    Administer for PICC line occlusion and as ordered by physician for other access device issues.   Amb Referral to Cardiac Rehabilitation   Complete by: As directed    Diagnosis:  CABG Valve Replacement     Valve:  Aortic Tricuspid     CABG X ___: 1   After initial evaluation and assessments completed: Virtual Based Care may be provided alone or in conjunction with Phase 2  Cardiac Rehab based on patient barriers.: Yes   Intensive Cardiac Rehabilitation (ICR) [facility] location only OR Traditional Cardiac Rehabilitation (TCR) *If criteria for ICR are not met will enroll in TCR ([facility] only): Yes   Ambulatory referral to Neurology   Complete by: As directed    An appointment is requested in approximately: 8 weeks   Anaphylaxis Kit: Provided to treat any anaphylactic reaction to the medication being provided to the patient if First Dose or when requested by physician   Complete by: As directed    Epinephrine  1mg /ml vial / amp: Administer 0.3mg  (0.48ml) subcutaneously once for moderate to severe anaphylaxis, nurse to call physician and pharmacy when reaction occurs and call 911 if needed for immediate care   Diphenhydramine  50mg /ml IV vial: Administer 25-50mg  IV/IM PRN for first dose reaction, rash, itching, mild reaction, nurse to call physician and pharmacy when reaction occurs   Sodium Chloride  0.9% NS 500ml IV: Administer if needed for hypovolemic blood pressure drop or as ordered by physician after call to physician with anaphylactic reaction   Change dressing on IV access line weekly and PRN   Complete by: As directed    Flush IV access with Sodium Chloride  0.9% and Heparin  10 units/ml or 100 units/ml   Complete by: As directed    Home infusion instructions - [facility]   Complete by: As directed    Instructions: Flush IV access with Sodium Chloride  0.9% and Heparin  10units/ml or 100units/ml   Change dressing on IV access line: Weekly and PRN   Instructions Cath Flo 2mg : Administer for PICC Line occlusion and as ordered by physician for other access device   [facility] pharmacist to adjust dose for: Vancomycin , Aminoglycosides and other anti-infective therapies as requested by physician   Method of administration may be changed at the discretion of home infusion pharmacist based upon assessment of the patient and/or caregiver's ability to  self-administer the medication ordered   Complete by: As directed       Allergies as of 07/04/2024       Reactions   Tetanus Toxoids  Other (See Comments)   Childhood Allergy    Zetia  [ezetimibe ] Diarrhea        Medication List     STOP taking these medications    ciprofloxacin  500 MG tablet Commonly known as: CIPRO        TAKE these medications    acetaminophen  325 MG tablet Commonly known as: TYLENOL  Take 1-2 tablets (325-650 mg total) by mouth every 4 (four) hours as needed for mild pain (pain score 1-3).   amLODipine  5 MG tablet Commonly known as: NORVASC  Take 1 tablet (5 mg total) by mouth daily.   aspirin  81 MG chewable tablet Chew 81 mg by mouth daily. Swallow whole.   daptomycin  IVPB Commonly known as: CUBICIN  Inject 700 mg into the vein daily. 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   docusate sodium  100 MG capsule Commonly known as: COLACE Take 1 capsule (100 mg total) by mouth 2 (two) times daily. What changed:  when to take this reasons to take this   finasteride  5 MG tablet Commonly known as: PROSCAR  Take 5 mg by mouth every evening.   furosemide  40 MG tablet Commonly known as: LASIX  Take 1 tablet (40 mg total) by mouth daily. What changed: You were already taking a medication with the same name, and this prescription was added. Make sure you understand how and when to take each.   furosemide  20 MG tablet Commonly known as: LASIX  Take 1 tablet (20 mg total) by mouth daily as needed (for fluid, edema). Start taking on: July 13, 2024 What changed:  See the new instructions. These  instructions start on July 13, 2024. If you are unsure what to do until then, ask your doctor or other care provider.   IRON  27 PO Take 27 mg by mouth daily.   nitroGLYCERIN  0.4 MG SL tablet Commonly known as: NITROSTAT  Place 1 tablet (0.4 mg total) under the tongue every 5 (five) minutes as needed for chest pain.   potassium chloride  SA 20 MEQ tablet Commonly known as: KLOR-CON  M Take 1 tablet (20 mEq total) by mouth daily. X 7 days, then change to only days you take a Lasix    rosuvastatin  10 MG tablet Commonly known as: CRESTOR  Take 1 tablet (10 mg total) by mouth daily. What changed:  medication strength how much to take   traMADol  50 MG tablet Commonly known as: ULTRAM  Take 1 tablet (50 mg total) by mouth every 6 (six) hours as needed for moderate pain (pain score 4-6).               Discharge Care Instructions  (From admission, onward)           Start     Ordered   07/01/24 0000  Change dressing on IV access line weekly and PRN  (Home infusion instructions - [facility] )        07/01/24 1222            Follow-up Information     Lucas Dorise POUR, MD Follow up on 08/07/2024.   Specialty: Cardiothoracic Surgery Why: Appoinment is at 4:30, please get CXR at 3:30 prior to your appoitment with Dr.  Bartle on the 2nd floor of our office building Contact information: [address] [place] [address] [phone]         Dennise Kingsley, MD Follow up on 07/17/2024.   Specialty: Infectious Diseases Why: 7/30 @ 2:45, Hospital Discharge Follow Up Contact information: [address] [place] [address] [phone]                 Signed:  Rocky Shad, PA-C  07/04/2024, 7:35 AM

## 2024-07-01 NOTE — Discharge Instructions (Signed)
 Discharge Instructions:  1. You may shower, please wash incisions daily with soap and water and keep dry.  If you wish to cover wounds with dressing you may do so but please keep clean and change daily.  No tub baths or swimming until incisions have completely healed.  If your incisions become red or develop any drainage please call our office at [phone]  2. No Driving until cleared by Dr. Sharee Pimple office and you are no longer using narcotic pain medications  3. Monitor your weight daily.. Please use the same scale and weigh at same time... If you gain 5-10 lbs in 48 hours with associated lower extremity swelling, please contact our office at [phone]  4. Fever of 101.5 for at least 24 hours with no source, please contact our office at [phone]  5. Activity- up as tolerated, please walk at least 3 times per day.  Avoid strenuous activity, no lifting, pushing, or pulling with your arms over 8-10 lbs for a minimum of 6 weeks  6. If any questions or concerns arise, please do not hesitate to contact our office at [phone]

## 2024-07-01 NOTE — Progress Notes (Signed)
 [facility]  Date of Admission:  06/12/2024      Total days of antibiotics 16   Daptomycin  7/14 >>  Vancomycin  through 7/09        ASSESSMENT: Kyle Matthews is a 78 y.o. male admitted with:   Bioprosthetic AV Endocarditis, MRSE -  Acute CVA -  S/P Redo Sternotomy, Bentall and TV replacement 06/26/2024 -  C/B CHB post surgery -  Received 2 weeks of Vancomycin  for better CNS coverage in light of CVA from septic emboli - now on daptomycin  to finish out course of treatment through 08/07/24 to complete 6 weeks from surgery.  Going for PPM to treat post op CHB - OK from ID perspective for device to be placed anytime given no fevers, resolved leukocytosis and negative blood cultures.  - Daptomycin  to continue through 08/07/2024 - IV abx orders as outlined below   ARF -  Prolonged pump time and vancomycin  - off nephrotoxic abx now. Creatinine improving and non-oliguric  Nephrology following - Continue to follow for recovery and further dose adjustments for abx   Medication Monitoring -  Follow CK weekly while on Daptomycin  - 12 recently  - ID pharmacy team to help follow for dose adjustments on abx to account for renal recovery  Vascular Access -  Will need PICC Line for prolonged therapies but will need to wait until EP procedures are complete.  OK from ID perspective any time - please place order once he is ready from EP/TCTS perspective   ID will sign off - please call back with any questions/concerns or if we can be of further assistance.     PLAN: IV daptomycin  EOT 8/20 FU with ID arranged 7/30 @ 2:45 and 8/20 @ 1:30 with Dr. Dennise    OPAT ORDERS:  Diagnosis: Endocarditis (prosthetic/native)   Culture Result: MRSE  Allergies  Allergen Reactions   Tetanus Toxoids Other (See Comments)    Childhood Allergy    Zetia  [Ezetimibe ] Diarrhea     Discharge antibiotics to be given via PICC line:  Per pharmacy protocol Daptomycin      Duration: 6 weeks   End Date: 08/07/2024  [facility] Care Per Protocol with Biopatch Use: Home health RN for IV administration and teaching, line care and labs.    Labs weekly while on IV antibiotics: _x_ CBC with differential __ BMP **TWICE WEEKLY ON VANCOMYCIN   _x_ CMP __ CRP __ ESR __ Vancomycin  trough TWICE WEEKLY _x_ CK  _x_ Please pull PIC at completion of IV antibiotics __ Please leave PIC in place until doctor has seen patient or been notified  Fax weekly labs to [phone]  Clinic Follow Up Appt: 7/30 @ 2:45     Principal Problem:   Endocarditis of prosthetic aortic valve Active Problems:   Essential hypertension   Chest pain   Chronic heart failure with preserved ejection fraction (HFpEF) (HCC)   S/P CABG x 2   S/P AVR (aortic valve replacement)   BPH with obstruction/lower urinary tract symptoms   SIRS (systemic inflammatory response syndrome) (HCC)   Bacteremia   Acute embolic stroke (HCC)   Endocarditis of tricuspid valve   Congestive heart failure (HCC)   Prosthetic valve endocarditis   Acute stroke due to ischemia (HCC)   S/P TVR (tricuspid valve replacement)   S/P CABG x 1   S/P Bentall Procedure    sodium chloride    Intravenous Once   acetaminophen   1,000 mg Oral Q6H  Or   acetaminophen  (TYLENOL ) oral liquid 160 mg/5 mL  1,000 mg Per Tube Q6H   aspirin  EC  325 mg Oral Daily   Or   aspirin   324 mg Per Tube Daily   bisacodyl   10 mg Oral Daily   Or   bisacodyl   10 mg Rectal Daily   chlorhexidine   60 mL Topical Once   chlorhexidine   60 mL Topical Once   Chlorhexidine  Gluconate Cloth  6 each Topical Daily   docusate sodium   200 mg Oral BID   finasteride   5 mg Oral QPM   gentamicin  (GARAMYCIN ) 80 mg in sodium chloride  0.9 % 500 mL irrigation  80 mg Irrigation On Call   insulin  aspart  0-9 Units Subcutaneous TID WC   midodrine   5 mg Oral Q8H   pantoprazole   40 mg Oral Daily   polyethylene glycol  17 g Oral BID   rosuvastatin   10 mg  Oral Daily   sodium chloride  flush  3 mL Intravenous Q12H    SUBJECTIVE: Doing well today. Tired but no complaints otherwise.   Review of Systems: Review of Systems  Constitutional:  Positive for malaise/fatigue. Negative for chills and fever.  HENT:  Negative for tinnitus.   Eyes:  Negative for blurred vision and photophobia.  Respiratory:  Negative for cough and sputum production.   Cardiovascular:  Positive for chest pain.  Gastrointestinal:  Negative for diarrhea, nausea and vomiting.  Genitourinary:  Negative for dysuria.  Skin:  Negative for rash.  Neurological:  Negative for headaches.    Allergies  Allergen Reactions   Tetanus Toxoids Other (See Comments)    Childhood Allergy    Zetia  [Ezetimibe ] Diarrhea    OBJECTIVE: Vitals:   07/01/24 0500 07/01/24 0600 07/01/24 0700 07/01/24 0800  BP: 114/62 98/60  (!) 104/55  Pulse: 60 60 60 60  Resp: 15 16 20 14   Temp: 97.8 F (36.6 C)   98 F (36.7 C)  TempSrc: Oral   Oral  SpO2: 97% 97% 95% 99%  Weight: 74.2 kg     Height:       Body mass index is 29.92 kg/m (pended).  Physical Exam Constitutional:      Appearance: Normal appearance. He is not ill-appearing.  HENT:     Head: Normocephalic.     Mouth/Throat:     Mouth: Mucous membranes are moist.     Pharynx: Oropharynx is clear.  Eyes:     General: No scleral icterus. Cardiovascular:     Rate and Rhythm: Normal rate.     Comments: Paced on telemetry  Pulmonary:     Effort: Pulmonary effort is normal.  Musculoskeletal:        General: Normal range of motion.     Cervical back: Normal range of motion.  Skin:    Coloration: Skin is not jaundiced or pale.  Neurological:     Mental Status: He is alert and oriented to person, place, and time.  Psychiatric:        Mood and Affect: Mood normal.        Judgment: Judgment normal.     Lab Results Lab Results  Component Value Date   WBC 9.0 07/01/2024   HGB 8.2 (L) 07/01/2024   HCT 24.7 (L) 07/01/2024    MCV 88.8 07/01/2024   PLT 134 (L) 07/01/2024    Lab Results  Component Value Date   CREATININE 2.13 (H) 07/01/2024   BUN 55 (H) 07/01/2024   NA 133 (L) 07/01/2024  K 3.6 07/01/2024   CL 102 07/01/2024   CO2 23 07/01/2024    Lab Results  Component Value Date   ALT 12 06/12/2024   AST 19 06/12/2024   ALKPHOS 64 06/12/2024   BILITOT 0.8 06/12/2024     Microbiology: Recent Results (from the past 240 hours)  Surgical pcr screen     Status: None   Collection Time: 06/25/24 12:23 PM   Specimen: Nasal Mucosa; Nasal Swab  Result Value Ref Range Status   MRSA, PCR NEGATIVE NEGATIVE Final   Staphylococcus aureus NEGATIVE NEGATIVE Final    Comment: (NOTE) The Xpert SA Assay (FDA approved for NASAL specimens in patients 68 years of age and older), is one component of a comprehensive surveillance program. It is not intended to diagnose infection nor to guide or monitor treatment. Performed at [facility], [address], [place], [place] [address]   SARS Coronavirus 2 by RT PCR (hospital order, performed in [facility] hospital lab) *cepheid single result test* Nasal Mucosa     Status: None   Collection Time: 06/25/24 12:24 PM   Specimen: Nasal Mucosa; Nasal Swab  Result Value Ref Range Status   SARS Coronavirus 2 by RT PCR NEGATIVE NEGATIVE Final    Comment: Performed at [facility], [address] [place], [place] [address]  Fungus Culture With Stain     Status: None (Preliminary result)   Collection Time: 06/26/24 11:39 AM   Specimen: Path Tissue  Result Value Ref Range Status   Fungus Stain Final report  Final    Comment: (NOTE) Performed At: [facility] [address] [place], [place] [id number] Jennette Shorter MD Ey:[id number]    Fungus (Mycology) Culture PENDING  Incomplete   Fungal Source TISSUE  Final    Comment: Performed at [facility], [address] [place], [place] [address]  Aerobic/Anaerobic Culture w Gram Stain  (surgical/deep wound)     Status: None (Preliminary result)   Collection Time: 06/26/24 11:39 AM   Specimen: Path Tissue  Result Value Ref Range Status   Specimen Description TISSUE  Final   Special Requests NONE  Final   Gram Stain   Final    FEW WBC PRESENT, PREDOMINANTLY PMN NO ORGANISMS SEEN    Culture   Final    NO GROWTH 4 DAYS NO ANAEROBES ISOLATED; CULTURE IN PROGRESS FOR 5 DAYS Performed at [facility], [address], [place], [place] [address]    Report Status PENDING  Incomplete  Acid Fast Smear (AFB)     Status: None   Collection Time: 06/26/24 11:39 AM   Specimen: Path Tissue  Result Value Ref Range Status   AFB Specimen Processing Comment  Final    Comment: Tissue Grinding and Digestion/Decontamination   Acid Fast Smear Negative  Final    Comment: (NOTE) Performed At: [facility] [address] [place], [place] [id number] Jennette Shorter MD Ey:[id number]    Source (AFB) TISSUE  Final    Comment: Performed at [facility], [address] [place], [place] [address]  Fungus Culture Result     Status: None   Collection Time: 06/26/24 11:39 AM  Result Value Ref Range Status   Result 1 Comment  Final    Comment: (NOTE) KOH/Calcofluor preparation:  no fungus observed. Performed At: [facility] [address] [place], [place] [id number] Jennette Shorter MD Ey:[id number]   Fungus Culture With Stain     Status: None (Preliminary  result)   Collection Time: 06/26/24 11:41 AM   Specimen: Path Tissue  Result Value Ref Range Status   Fungus Stain Final report  Final    Comment: (NOTE) Performed At: [facility] [address] [place], [place] [id number] Jennette Shorter MD Ey:[id number]    Fungus (Mycology) Culture PENDING  Incomplete   Fungal Source TISSUE  Final    Comment: Performed at [facility], [address], [place], [place] [address]  Aerobic/Anaerobic Culture w Gram Stain (surgical/deep wound)     Status: None  (Preliminary result)   Collection Time: 06/26/24 11:41 AM   Specimen: Path Tissue  Result Value Ref Range Status   Specimen Description TISSUE  Final   Special Requests NONE  Final   Gram Stain   Final    RARE WBC PRESENT, PREDOMINANTLY PMN NO ORGANISMS SEEN    Culture   Final    NO GROWTH 4 DAYS NO ANAEROBES ISOLATED; CULTURE IN PROGRESS FOR 5 DAYS Performed at [facility], [address], [place], [place] [address]    Report Status PENDING  Incomplete  Acid Fast Smear (AFB)     Status: None   Collection Time: 06/26/24 11:41 AM   Specimen: Path Tissue  Result Value Ref Range Status   AFB Specimen Processing Comment  Final    Comment: Tissue Grinding and Digestion/Decontamination   Acid Fast Smear Negative  Final    Comment: (NOTE) Performed At: [facility] [address] [place], [place] [id number] Jennette Shorter MD Ey:[id number]    Source (AFB) TISSUE  Final    Comment: Performed at [facility], [address], [place], [place] [address]  Fungus Culture Result     Status: None   Collection Time: 06/26/24 11:41 AM  Result Value Ref Range Status   Result 1 Comment  Final    Comment: (NOTE) KOH/Calcofluor preparation:  no fungus observed. Performed At: [facility] [address] [place], [place] [id number] Jennette Shorter MD Ey:[id number]   Fungus Culture With Stain     Status: None (Preliminary result)   Collection Time: 06/26/24  2:48 PM   Specimen: Path Tissue  Result Value Ref Range Status   Fungus Stain Final report  Final    Comment: (NOTE) Performed At: [facility] [address] [place], [place] [id number] Jennette Shorter MD Ey:[id number]    Fungus (Mycology) Culture PENDING  Incomplete   Fungal Source TRICUSPID VALVE  Final    Comment: Performed at [facility], [address] [place], [place] [address]  Aerobic/Anaerobic Culture w Gram Stain (surgical/deep wound)     Status: None (Preliminary result)    Collection Time: 06/26/24  2:48 PM   Specimen: Path Tissue  Result Value Ref Range Status   Specimen Description TISSUE  Final   Special Requests TRICUSPID VALVE  Final   Gram Stain NO WBC SEEN NO ORGANISMS SEEN   Final   Culture   Final    NO GROWTH 4 DAYS NO ANAEROBES ISOLATED; CULTURE IN PROGRESS FOR 5 DAYS Performed at [facility], [address], [place], [place] [address]    Report Status PENDING  Incomplete  Acid Fast Smear (AFB)     Status: None   Collection Time: 06/26/24  2:48 PM   Specimen: Path Tissue  Result Value Ref Range Status   AFB Specimen Processing Comment  Final    Comment: Tissue Grinding and Digestion/Decontamination   Acid Fast Smear Negative  Final    Comment: (NOTE) Performed At: [facility] [address] [place], [place] [id number] Jennette Shorter MD Ey:[id number]    Source (AFB) TRICUSPID VALVE  Final    Comment: Performed at [facility], [address] [place], [place] [address]  Fungus Culture Result     Status: None   Collection Time: 06/26/24  2:48 PM  Result Value Ref Range Status   Result 1 Comment  Final    Comment: (NOTE) KOH/Calcofluor preparation:  no fungus observed. Performed At: [facility] [address] [place], [place] [id number] Jennette Shorter MD Ey:[id number]     Corean Fireman, MSN, NP-C [facility] Pager: [phone]  07/01/2024  11:50 AM  Total Encounter Time: 18 min

## 2024-07-01 NOTE — Progress Notes (Signed)
 Pharmacy Antibiotic Note  Kyle Matthews is a 78 y.o. male admitted on 06/12/2024 with MRSE bacteremia. Pt noted to have vegetation on prosthetic aortic valve and tricuspid valve, and also possible CNS emboli.  Pharmacy has been consulted for daptomycin  dosing. Patient is now s/p TVR, CABG and Bentall procedure on 7/9. Noted that patient has an acute AKI post surgery with SCr now trending back down to 2.13 today. CrCl ~ 26.4 ml/min.   VR today returned at 16. Will proceed with daptomycin  dosing.   Plan: Daptomycin  700 mg every 48 hours CK ordered for 7/15  Monitor renal function, CK levels   Height: (P) 5' 2 (157.5 cm) Weight: 74.2 kg (163 lb 9.3 oz) IBW/kg (Calculated) : (P) 54.6  Temp (24hrs), Avg:97.7 F (36.5 C), Min:97.3 F (36.3 C), Max:98 F (36.7 C)  Recent Labs  Lab 06/27/24 1652 06/28/24 0446 06/28/24 1211 06/29/24 0432 06/30/24 0601 07/01/24 0431  WBC 17.3* 17.4*  --  14.2* 12.7* 9.0  CREATININE 2.02* 2.84*  --  3.58* 2.81* 2.13*  VANCOTROUGH  --   --  33*  --  22*  --   VANCORANDOM  --   --   --   --   --  16    Estimated Creatinine Clearance: 26.4 mL/min (A) (by C-G formula based on SCr of 2.13 mg/dL (H)).    Allergies  Allergen Reactions   Tetanus Toxoids Other (See Comments)    Childhood Allergy    Zetia  [Ezetimibe ] Diarrhea       Damien Quiet, PharmD, BCPS, BCIDP Infectious Diseases Clinical Pharmacist Phone: [phone] Please check AMION for all [facility] Pharmacy phone numbers After 10:00 PM, call Main Pharmacy [phone] 07/01/2024 7:51 AM

## 2024-07-01 NOTE — Progress Notes (Signed)
 Advanced Heart Failure Rounding Note  Cardiologist: Lurena MARLA Red, MD   Chief Complaint: Bioprosthetic aortic valve endocarditis and tricuspid valve endocarditis  Subjective:    7/10: AVR/Bental, TVR and CABG X 1 (SVG to RCA) with prolonged pump run. Post op course c/b CHB and AKI.  POD #5  Continues w/ CHB, A-V paced 60s currently.   Scr 2.81>>2.13 Below pre-op  wt   Says he feels better. No chest pain or dyspnea. Had BM this morning.    Objective:   Weight Range: 74.2 kg Body mass index is 29.92 kg/m (pended).   Vital Signs:   Temp:  [97.3 F (36.3 C)-98 F (36.7 C)] 97.8 F (36.6 C) (07/14 0500) Pulse Rate:  [59-69] 60 (07/14 0700) Resp:  [11-20] 20 (07/14 0700) BP: (81-130)/(49-71) 98/60 (07/14 0600) SpO2:  [95 %-100 %] 95 % (07/14 0700) Weight:  [74.2 kg] 74.2 kg (07/14 0500) Last BM Date : 06/25/24  Weight change: Filed Weights   06/29/24 0630 06/30/24 0500 07/01/24 0500  Weight: 74.8 kg 73.6 kg 74.2 kg    Intake/Output:   Intake/Output Summary (Last 24 hours) at 07/01/2024 0802 Last data filed at 07/01/2024 0700 Gross per 24 hour  Intake 1437 ml  Output 1210 ml  Net 227 ml      Physical Exam    General: fatigued appearing, laying in bed. No respiratory difficulty HEENT: normal Neck: supple. no JVD. Carotids 2+ bilat; no bruits. No lymphadenopathy or thyromegaly appreciated. Cor: PMI nondisplaced. Regular rate & rhythm. Sternal wound ok  Abdomen: soft, nontender, nondistended. No hepatosplenomegaly. No bruits or masses. Good bowel sounds. Extremities: no cyanosis, clubbing, rash, edema Neuro: alert & oriented x 3, cranial nerves grossly intact. moves all 4 extremities w/o difficulty. Affect pleasant.   Telemetry   AV paced 60 bpm, underlying CHB Personally reviewed   Labs    CBC Recent Labs    06/30/24 0601 06/30/24 1626 07/01/24 0431  WBC 12.7*  --  9.0  HGB 6.9* 8.4* 8.2*  HCT 21.2* 25.5* 24.7*  MCV 91.4  --  88.8  PLT  138*  --  134*   Basic Metabolic Panel Recent Labs    92/86/74 0601 07/01/24 0431  NA 131* 133*  K 3.7 3.6  CL 100 102  CO2 23 23  GLUCOSE 92 104*  BUN 51* 55*  CREATININE 2.81* 2.13*  CALCIUM  7.3* 7.4*   Liver Function Tests No results for input(s): AST, ALT, ALKPHOS, BILITOT, PROT, ALBUMIN  in the last 72 hours. No results for input(s): LIPASE, AMYLASE in the last 72 hours. Cardiac Enzymes No results for input(s): CKTOTAL, CKMB, CKMBINDEX, TROPONINI in the last 72 hours.  BNP: BNP (last 3 results) Recent Labs    10/03/23 0412 10/04/23 0509 06/12/24 1819  BNP 547.0* 501.3* 169.0*    ProBNP (last 3 results) No results for input(s): PROBNP in the last 8760 hours.   D-Dimer No results for input(s): DDIMER in the last 72 hours. Hemoglobin A1C No results for input(s): HGBA1C in the last 72 hours. Fasting Lipid Panel No results for input(s): CHOL, HDL, LDLCALC, TRIG, CHOLHDL, LDLDIRECT in the last 72 hours. Thyroid  Function Tests No results for input(s): TSH, T4TOTAL, T3FREE, THYROIDAB in the last 72 hours.  Invalid input(s): FREET3  Other results:   Imaging    No results found.    Medications:     Scheduled Medications:  sodium chloride    Intravenous Once   acetaminophen   1,000 mg Oral Q6H   Or  acetaminophen  (TYLENOL ) oral liquid 160 mg/5 mL  1,000 mg Per Tube Q6H   aspirin  EC  325 mg Oral Daily   Or   aspirin   324 mg Per Tube Daily   bisacodyl   10 mg Oral Daily   Or   bisacodyl   10 mg Rectal Daily   Chlorhexidine  Gluconate Cloth  6 each Topical Daily   docusate sodium   200 mg Oral BID   finasteride   5 mg Oral QPM   insulin  aspart  0-9 Units Subcutaneous TID WC   midodrine   5 mg Oral Q8H   pantoprazole   40 mg Oral Daily   polyethylene glycol  17 g Oral BID   rosuvastatin   10 mg Oral Daily   sodium chloride  flush  3 mL Intravenous Q12H    Infusions:  chlorproMAZINE (THORAZINE) 12.5 mg in  sodium chloride  0.9 % 25 mL IVPB     DAPTOmycin      promethazine (PHENERGAN) injection (IM or IVPB)      PRN Medications: chlorproMAZINE (THORAZINE) 12.5 mg in sodium chloride  0.9 % 25 mL IVPB, HYDROmorphone  (DILAUDID ) injection, ondansetron  (ZOFRAN ) IV, mouth rinse, oxyCODONE , promethazine (PHENERGAN) injection (IM or IVPB), sodium chloride  flush, traMADol     Patient Profile   78 y.o. male with history of severe AS s/p AVR in 11/24, CAD s/p CABG X 2 in 11/24, BPH s/p robot-assisted prostatectomy. 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 Prolonged pump time. EF preserved on intra-op TEE. - Off pressors/inortopes. On midodrine  5 mg tid for BP support. Keep MAP ~70 for renal perfusion  - Volume improved w/ diuretics, now below pre-op  wt  - on Daptomycin . ID following    2. CAD -S/p CABG X 2 (LIMA to LAD, SVG to Ramus) 11/24 -S/p CABG X 1 (SVG to RCA) 07/09 -Denies CP  -Continue aspirin  + statin   3. AKI -suspect 2/2 prolonged pump run -Scr 1>1.6>2.8-> 3.6 -> 2.8->2.1 today. Nonoliguric   -Keep MAP > 70 for renal perfusion -Nephrology following  -follow BMP and UOP   4. Acute CVA -Small left occipital infarct on MRI -Suspect d/t septic emboli   5. Post-op blood loss anemia - hgb 8.2 today  - transfuse per TCTS  6. CHB  - conduction system likely resected during surgery due to abscess - pacer dependent - EP following, planning PPM today    Length of Stay: [id number] PA-C  07/01/2024, 8:02 AM  Advanced Heart Failure Team Pager [phone] (M-F; 7a - 5p)  Please contact [facility] for night-coverage after hours (5p -7a ) and weekends  on [email or web address]

## 2024-07-02 ENCOUNTER — Inpatient Hospital Stay (HOSPITAL_COMMUNITY)

## 2024-07-02 ENCOUNTER — Other Ambulatory Visit: Payer: Self-pay | Admitting: Cardiology

## 2024-07-02 ENCOUNTER — Encounter (HOSPITAL_COMMUNITY): Payer: Self-pay | Admitting: Cardiology

## 2024-07-02 DIAGNOSIS — Z952 Presence of prosthetic heart valve: Secondary | ICD-10-CM

## 2024-07-02 DIAGNOSIS — I442 Atrioventricular block, complete: Secondary | ICD-10-CM | POA: Diagnosis not present

## 2024-07-02 DIAGNOSIS — I330 Acute and subacute infective endocarditis: Secondary | ICD-10-CM | POA: Diagnosis not present

## 2024-07-02 DIAGNOSIS — T826XXA Infection and inflammatory reaction due to cardiac valve prosthesis, initial encounter: Secondary | ICD-10-CM | POA: Diagnosis not present

## 2024-07-02 DIAGNOSIS — Z954 Presence of other heart-valve replacement: Secondary | ICD-10-CM

## 2024-07-02 LAB — CBC
HCT: 24.4 % — ABNORMAL LOW (ref 39.0–52.0)
Hemoglobin: 7.9 g/dL — ABNORMAL LOW (ref 13.0–17.0)
MCH: 29.5 pg (ref 26.0–34.0)
MCHC: 32.4 g/dL (ref 30.0–36.0)
MCV: 91 fL (ref 80.0–100.0)
Platelets: 139 K/uL — ABNORMAL LOW (ref 150–400)
RBC: 2.68 MIL/uL — ABNORMAL LOW (ref 4.22–5.81)
RDW: 18.4 % — ABNORMAL HIGH (ref 11.5–15.5)
WBC: 8.4 K/uL (ref 4.0–10.5)
nRBC: 0.2 % (ref 0.0–0.2)

## 2024-07-02 LAB — BASIC METABOLIC PANEL WITH GFR
Anion gap: 12 (ref 5–15)
BUN: 45 mg/dL — ABNORMAL HIGH (ref 8–23)
CO2: 22 mmol/L (ref 22–32)
Calcium: 7.6 mg/dL — ABNORMAL LOW (ref 8.9–10.3)
Chloride: 101 mmol/L (ref 98–111)
Creatinine, Ser: 1.23 mg/dL (ref 0.61–1.24)
GFR, Estimated: >60 mL/min (ref >60)
Glucose, Bld: 106 mg/dL — ABNORMAL HIGH (ref 70–99)
Potassium: 3.8 mmol/L (ref 3.5–5.1)
Sodium: 135 mmol/L (ref 135–145)

## 2024-07-02 LAB — GLUCOSE, CAPILLARY
Glucose-Capillary: 98 mg/dL (ref 70–99)
Glucose-Capillary: 118 mg/dL — ABNORMAL HIGH (ref 70–99)

## 2024-07-02 LAB — CK: Total CK: 60 U/L (ref 49–397)

## 2024-07-02 MED ORDER — FERROUS SULFATE 325 (65 FE) MG PO TABS
325.0000 mg | ORAL_TABLET | Freq: Two times a day (BID) | ORAL | Status: DC
  Administered 2024-07-02 – 2024-07-04 (×5): 325 mg via ORAL
  Filled 2024-07-02 (×5): qty 1

## 2024-07-02 MED ORDER — MIDODRINE HCL 5 MG PO TABS: 5.0000 mg | ORAL_TABLET | Freq: Two times a day (BID) | ORAL | Status: DC

## 2024-07-02 MED ORDER — DAPTOMYCIN IV (FOR PTA / DISCHARGE USE ONLY): 700.0000 mg | INTRAVENOUS | 0 refills | Status: AC

## 2024-07-02 MED ORDER — FUROSEMIDE 40 MG PO TABS
40.0000 mg | ORAL_TABLET | Freq: Every day | ORAL | Status: DC
  Administered 2024-07-02 – 2024-07-04 (×3): 40 mg via ORAL
  Filled 2024-07-02 (×3): qty 1

## 2024-07-02 MED ORDER — POTASSIUM CHLORIDE CRYS ER 20 MEQ PO TBCR
20.0000 meq | EXTENDED_RELEASE_TABLET | Freq: Once | ORAL | Status: AC
  Administered 2024-07-02: 20 meq via ORAL
  Filled 2024-07-02: qty 1

## 2024-07-02 MED ORDER — DAPTOMYCIN-SODIUM CHLORIDE 700-0.9 MG/100ML-% IV SOLN
700.0000 mg | Freq: Every day | INTRAVENOUS | Status: DC
  Administered 2024-07-02 – 2024-07-04 (×3): 700 mg via INTRAVENOUS
  Filled 2024-07-02 (×3): qty 100

## 2024-07-02 NOTE — Progress Notes (Signed)
 PHARMACY CONSULT NOTE FOR:  OUTPATIENT  PARENTERAL ANTIBIOTIC THERAPY (OPAT)  Indication: MRSE Endocarditis s/p surgery 7/9  Regimen: Daptomycin  700 mg IV every 24 hours  End date: 08/07/24   Will monitor renal function for improvement and update OPAT as needed.   IV antibiotic discharge orders are pended. To discharging provider:  please sign these orders via discharge navigator,  Select New Orders & click on the button choice - Manage This Unsigned Work.     Thank you for allowing pharmacy to be a part of this patient's care.  Damien Quiet, PharmD, BCPS, BCIDP Infectious Diseases Clinical Pharmacist Phone: [phone] 07/02/2024, 9:08 AM

## 2024-07-02 NOTE — Progress Notes (Signed)
 Pharmacy Antibiotic Note  Kyle Matthews is a 78 y.o. male admitted on 06/12/2024 with MRSE bacteremia. Pt noted to have vegetation on prosthetic aortic valve and tricuspid valve, and also possible CNS emboli.  Pharmacy has been consulted for daptomycin  dosing. Patient is now s/p TVR, CABG and Bentall procedure on 7/9. Noted that patient has an acute AKI post surgery with SCr now trending back down to close to baseline with SCr 1.23 today. CrCl ~ 45.4 ml/min.  Baseline CK 60 today.  Plan: Daptomycin  700 mg every 24 hours Pharmacy will sign-off with improved renal function and monitor CK remotely.   Height: (P) 5' 2 (157.5 cm) Weight: 72.9 kg (160 lb 11.5 oz) IBW/kg (Calculated) : (P) 54.6  Temp (24hrs), Avg:98.5 F (36.9 C), Min:98.2 F (36.8 C), Max:98.7 F (37.1 C)  Recent Labs  Lab 06/28/24 0446 06/28/24 1211 06/29/24 0432 06/30/24 0601 07/01/24 0431 07/02/24 0529  WBC 17.4*  --  14.2* 12.7* 9.0 8.4  CREATININE 2.84*  --  3.58* 2.81* 2.13* 1.23  VANCOTROUGH  --  33*  --  22*  --   --   VANCORANDOM  --   --   --   --  16  --     Estimated Creatinine Clearance: 45.4 mL/min (by C-G formula based on SCr of 1.23 mg/dL).    Allergies  Allergen Reactions   Tetanus Toxoids Other (See Comments)    Childhood Allergy    Zetia  [Ezetimibe ] Diarrhea       Damien Quiet, PharmD, BCPS, BCIDP Infectious Diseases Clinical Pharmacist Phone: [phone] Please check AMION for all [facility] Pharmacy phone numbers After 10:00 PM, call Main Pharmacy [phone] 07/02/2024 9:07 AM

## 2024-07-02 NOTE — Progress Notes (Signed)
 Echocardiogram to be done around 08/07/2024. Post aortic and tricuspid valve replacement. To be read by Dr. Wendel Waddell DELENA Natalia, PA-C 07/02/2024 10:42 AM

## 2024-07-02 NOTE — Progress Notes (Signed)
 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 Post op course c/b CHB and AKI.  POD #6  S/p dual chamber PPM yesterday  AKI improving, Scr 2.13>>1.23 today  On Daptamycin for anti-MRSE. CK level WNL today    Reports some slight fatigue but says he is feeling better each day. Just had a BM. No complaints.    Objective:   Weight Range: 72.9 kg Body mass index is 29.4 kg/m (pended).   Vital Signs:   Temp:  [98 F (36.7 C)-98.7 F (37.1 C)] 98.2 F (36.8 C) (07/14 1550) Pulse Rate:  [0-91] 70 (07/15 0500) Resp:  [14-33] 14 (07/15 0500) BP: (95-151)/(55-83) 135/78 (07/15 0500) SpO2:  [92 %-100 %] 97 % (07/15 0500) Weight:  [72.9 kg] 72.9 kg (07/15 0500) Last BM Date : 07/01/24  Weight change: Filed Weights   06/30/24 0500 07/01/24 0500 07/02/24 0500  Weight: 73.6 kg 74.2 kg 72.9 kg    Intake/Output:   Intake/Output Summary (Last 24 hours) at 07/02/2024 0733 Last data filed at 07/02/2024 0500 Gross per 24 hour  Intake 1306.48 ml  Output 715 ml  Net 591.48 ml      Physical Exam    General:  Well appearing. No respiratory difficulty HEENT: normal Neck: supple. JVD 9 cm  thyromegaly appreciated. Cor: PMI nondisplaced. Regular rate & rhythm. No rubs, gallops or murmurs. Lungs: clear Abdomen: soft, nontender, nondistended. No hepatosplenomegaly. No bruits or masses. Good bowel sounds. Extremities: no cyanosis, clubbing, rash, edema Neuro: alert & oriented x 3, cranial nerves grossly intact. moves all 4 extremities w/o difficulty. Affect pleasant.   Telemetry   V paced 70s bpm Personally reviewed   Labs    CBC Recent Labs    07/01/24 0431 07/02/24 0529  WBC 9.0 8.4  HGB 8.2* 7.9*  HCT 24.7* 24.4*  MCV 88.8 91.0  PLT 134* 139*   Basic  Metabolic Panel Recent Labs    92/85/74 0431 07/02/24 0529  NA 133* 135  K 3.6 3.8  CL 102 101  CO2 23 22  GLUCOSE 104* 106*  BUN 55* 45*  CREATININE 2.13* 1.23  CALCIUM  7.4* 7.6*   Liver Function Tests No results for input(s): AST, ALT, ALKPHOS, BILITOT, PROT, ALBUMIN  in the last 72 hours. No results for input(s): LIPASE, AMYLASE in the last 72 hours. Cardiac Enzymes Recent Labs    07/02/24 0529  CKTOTAL 60    BNP: BNP (last 3 results) Recent Labs    10/03/23 0412 10/04/23 0509 06/12/24 1819  BNP 547.0* 501.3* 169.0*    ProBNP (last 3 results) No results for input(s): PROBNP in the last 8760 hours.   D-Dimer No results for input(s): DDIMER in the last 72 hours. Hemoglobin A1C No results for input(s): HGBA1C in the last 72 hours. Fasting Lipid Panel No results for input(s): CHOL, HDL, LDLCALC, TRIG, CHOLHDL, LDLDIRECT in the last 72 hours. Thyroid  Function Tests No results for input(s): TSH, T4TOTAL, T3FREE, THYROIDAB in the last 72 hours.  Invalid input(s): FREET3  Other results:   Imaging    DG Chest 2 View Result Date: 07/02/2024 CLINICAL DATA:  Status post pacer placement EXAM: CHEST - 2 VIEW COMPARISON:  06/30/2024. FINDINGS: There is a left chest wall pacer device with leads in the right atrial appendage and coronary sinus.  Stable cardiomediastinal contours. Aortic atherosclerosis. Status post aortic valve replacement. Small bilateral pleural effusions are unchanged, left greater than right. Persistent retrocardiac opacification compatible with atelectasis. The right IJ catheter and bilateral chest tubes have been removed. No visible pneumothorax identified. IMPRESSION: 1. Interval removal of right IJ catheter and bilateral chest tubes. No visible pneumothorax. 2. Unchanged small bilateral pleural effusions, left greater than right. 3. Persistent retrocardiac opacification compatible with atelectasis. 4. Status  post left chest wall pacer placement without signs of pneumothorax. Electronically Signed   By: Waddell Calk M.D.   On: 07/02/2024 07:23   EP PPM/ICD IMPLANT Result Date: 07/01/2024 SURGEON: Soyla Norton, MD PREPROCEDURE DIAGNOSES: 1.  Complete heart block POSTPROCEDURE DIAGNOSES: 1.  Complete heart block PROCEDURES: 1.  Dual-chamber pacemaker implantation INTRODUCTION:  Kyle Matthews is a 78 y.o. male with a history of aortic valve endocarditis and tricuspid valve replacement and postoperative complete heart block presents for her implant. DESCRIPTION OF PROCEDURE: Informed written consent was obtained and the patient was brought to the electrophysiology lab in the fasting state. The patient was adequately sedated with intravenous Versed , and fentanyl  as outlined in the nursing report. The patient's left chest was prepped and draped in the usual sterile fashion by the EP lab staff. The skin overlying the left deltopectoral region was infiltrated with lidocaine  for local analgesia. A 5-cm incision was made over the left deltopectoral region. A left subcutaneous pacemaker pocket was fashioned using a combination of sharp and blunt dissection. Electrocautery was used to assure hemostasis. RA Lead Placement: The left axillary vein was cannulated with fluoroscopic visualization. No contrast was required for this endeavor. Through the left axillary vein, a Abbott Ultipace [id number] (serial # [id number]) right atrial lead was advanced with fluoroscopic visualization into the right atrial appendage. Initial atrial lead P-waves measured 3 point mV with an impedance of 510 ohms and a threshold of 0.75 volts at 0.5 milliseconds. Due to the presence of a new tricuspid valve replacement, no right ventricular lead was implanted. LV Lead Placement: An Abbott medium length left bundle guide was advanced through the left axillary vein into the low lateral right atrium. A versacore wire was introduced through the guide and used to  cannulate the coronary sinus. A selective coronary sinus venogram was performed by hand injection of nonionic contrast. This demonstrated a large CS body with very small/ atretic distal branches. There was a moderate sized lateral coronary sinus branch was noted along the mid portion of the CS body. No other posterior branches were identified. A Whisper CSJ wire was introduced through the guide and advanced into the distal posterolateral branch. A Abbott Quartet [id number] (serial number [id number]) lead was advanced through the guide into the lateral branch. This was approximately one-thirds from the base to the apex in a very lateral position. In this location, the left ventricular lead R-waves measured paced with impedance of 630 ohms and a threshold of 0.75 volt at 0.5 milliseconds in the bipolar LV1-LV2 configuration with no diaphragmatic stimulation observed when pacing at 10 volts output. The guide was therefore removed. All three leads were secured to the pectoralis fascia using #2 silk suture over the suture sleeves. The pocket then irrigated with copious gentamicin  solution. The leads were then connected to a [employer] MP [id number]  (serial Number [id number]) device. The defibrillator was placed into the pocket. The pocket was then closed in 3 layers with 2.0 V-Loc suture for the subcutaneous and 3.0 Vicryl suture subcuticular layers. Steri-Strips and a sterile  dressing were then applied. The procedure was therefore considered completed. EBL<54ml. There were no early apparhent complications. CONCLUSIONS: 1.  Complete heart block 2. Successful maker implantation. 3. No early apparent complications.      Medications:     Scheduled Medications:  sodium chloride    Intravenous Once   aspirin  EC  325 mg Oral Daily   Or   aspirin   324 mg Per Tube Daily   bisacodyl   10 mg Oral Daily   Or   bisacodyl   10 mg Rectal Daily   Chlorhexidine  Gluconate Cloth  6 each Topical Daily   docusate sodium   200 mg  Oral BID   finasteride   5 mg Oral QPM   insulin  aspart  0-9 Units Subcutaneous TID WC   midodrine   5 mg Oral BID WC   pantoprazole   40 mg Oral Daily   polyethylene glycol  17 g Oral BID   rosuvastatin   10 mg Oral Daily   sodium chloride  flush  3 mL Intravenous Q12H    Infusions:  chlorproMAZINE (THORAZINE) 12.5 mg in sodium chloride  0.9 % 25 mL IVPB     DAPTOmycin  Stopped (07/01/24 1045)   promethazine (PHENERGAN) injection (IM or IVPB)      PRN Medications: acetaminophen , chlorproMAZINE (THORAZINE) 12.5 mg in sodium chloride  0.9 % 25 mL IVPB, HYDROmorphone  (DILAUDID ) injection, ondansetron  (ZOFRAN ) IV, mouth rinse, oxyCODONE , promethazine (PHENERGAN) injection (IM or IVPB), sodium chloride  flush, traMADol     Patient Profile   78 y.o. male with history of severe AS s/p AVR in 11/24, CAD s/p CABG X 2 in 11/24, BPH s/p robot-assisted prostatectomy. 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 Prolonged pump time. EF preserved on intra-op TEE. - Off pressors/inortopes. On midodrine  5 mg tid for BP support. BPs appear stable. Will trial off midodrine  and follow response  - mild volume overload on exam, start PO Lasix  40 mg daily  - on Daptomycin . ID following CK levels. Will need RUE PICC for continuation of home abx x 6 wks    2. CAD -S/p CABG X 2 (LIMA to LAD, SVG to Ramus) 11/24 -S/p CABG X 1 (SVG to RCA) 07/09 -Stable w/o CP  -Continue aspirin  + statin   3. AKI -suspect 2/2 prolonged pump run, SCr peaked to 3.6 -Now resolved, SCr 1.23 today.  - follow BMP   4. Acute CVA -Small left occipital infarct on MRI -Suspect d/t septic emboli   5. Post-op blood  loss anemia - hgb 7.9 today  - transfuse per TCTS  6. CHB  - conduction system likely resected during surgery due to abscess - pacer dependent - S/p dual chamber PPM 7/14   Pt being transferred out of the ICU today. No AHF needs. Will reassign to general cardiology rounds once on the floor.    Heart failure team will sign off as of 07/02/24  Follow up as an outpatient in the HF clinic ?  NO  If no please follow up with [facility] after discharge.     Length of Stay: [address] PA-C  07/02/2024, 7:33 AM  Advanced Heart Failure Team Pager [phone] (M-F; 7a - 5p)  Please contact [facility] for night-coverage after hours (5p -7a ) and weekends on [email or web address]

## 2024-07-02 NOTE — Plan of Care (Signed)
  Problem: Education: Goal: Knowledge of the procedure and recovery process will improve Outcome: Progressing   Problem: Bowel/Gastric: Goal: Gastrointestinal status for postoperative course will improve Outcome: Progressing   Problem: Pain Management: Goal: General experience of comfort will improve Outcome: Progressing   Problem: Education: Goal: Knowledge of General Education information will improve Description: Including pain rating scale, medication(s)/side effects and non-pharmacologic comfort measures Outcome: Progressing   Problem: Health Behavior/Discharge Planning: Goal: Ability to manage health-related needs will improve Outcome: Progressing   Problem: Clinical Measurements: Goal: Ability to maintain clinical measurements within normal limits will improve Outcome: Progressing   Problem: Coping: Goal: Level of anxiety will decrease Outcome: Progressing

## 2024-07-02 NOTE — Progress Notes (Signed)
      [address]       [place] [address]             [phone]      No new issues.  Ambulated well  Awaiting bed on 4E  [name] C. Kerrin, MD [facility] [phone]

## 2024-07-02 NOTE — Progress Notes (Addendum)
      [address] [employer] [address]             [phone]      1 Day Post-Op Procedure(s) (LRB): PACEMAKER IMPLANT (N/A)  Subjective:  Patient sitting up in chair.. Overall doing very well.  Feeling pretty good.    Objective: Vital signs in last 24 hours: Temp:  [98 F (36.7 C)-98.7 F (37.1 C)] 98.2 F (36.8 C) (07/14 1550) Pulse Rate:  [0-91] 70 (07/15 0500) Cardiac Rhythm: A-V Sequential paced (07/15 0400) Resp:  [14-33] 14 (07/15 0500) BP: (95-151)/(55-83) 135/78 (07/15 0500) SpO2:  [92 %-100 %] 97 % (07/15 0500) Weight:  [72.9 kg] 72.9 kg (07/15 0500)  Intake/Output from previous day: 07/14 0701 - 07/15 0700 In: 1306.5 [P.O.:720; I.V.:388.1; IV Piggyback:198.4] Out: 715 [Urine:715]  General appearance: alert, cooperative, and no distress Heart: regular rate and rhythm Lungs: diminished breath sounds left base Abdomen: soft, non-tender; bowel sounds normal; no masses,  no organomegaly Extremities: edema trace Wound: clean and dry  Lab Results: Recent Labs    07/01/24 0431 07/02/24 0529  WBC 9.0 8.4  HGB 8.2* 7.9*  HCT 24.7* 24.4*  PLT 134* 139*   BMET:  Recent Labs    07/01/24 0431 07/02/24 0529  NA 133* 135  K 3.6 3.8  CL 102 101  CO2 23 22  GLUCOSE 104* 106*  BUN 55* 45*  CREATININE 2.13* 1.23  CALCIUM  7.4* 7.6*    PT/INR: No results for input(s): LABPROT, INR in the last 72 hours. ABG    Component Value Date/Time   PHART 7.361 06/27/2024 1259   HCO3 22.9 06/27/2024 1259   TCO2 24 06/27/2024 1259   ACIDBASEDEF 2.0 06/27/2024 1259   O2SAT 77 06/30/2024 0601   CBG (last 3)  Recent Labs    07/01/24 1203 07/01/24 1550 07/02/24 0632  GLUCAP 85 89 98    Assessment/Plan: S/P Procedure(s) (LRB): PACEMAKER IMPLANT (N/A)  CV- CHB, S/P PPM- hypotension, improving will decrease Midodrine  to 5 mg BID Pulm- continue to wean oxygen, saturations are normal.. CXR with trace left pleural effusion,  atelectasis Renal- AKI resolved, creatinine down to 1.28... may benefit from gentle diuretics ID- endocarditis- OR cultures are negative, remains afebrile.. continue ABX per ID recommendations.. will plan for PICC line placement prior to discharge Expected post operative blood loss anemia, not clinically significant will star iron  CBGs controlled, not a diabetic, will d/c CBGs, Dispo- patient doing well, d/c EPW, d/c foley, will transfer to 4E   LOS: 17 days    Rocky Shad, PA-C 07/02/2024  Patient seen and examined, agree with above AKI resolved Transfer to 4E  Elspeth C. Kerrin, MD [facility] [phone]

## 2024-07-02 NOTE — Progress Notes (Addendum)
 Patient Name: Kyle Matthews Date of Encounter: 07/02/2024  Primary Cardiologist: Lurena MARLA Red, MD Electrophysiologist: None  Interval Summary   Pt reports feeling well. No acute complaints.  Grateful for care.  Jokes that he wishes he could find a Curator as reliable.   Vital Signs    Vitals:   07/02/24 0200 07/02/24 0300 07/02/24 0400 07/02/24 0500  BP: 118/67 103/67 119/68 135/78  Pulse: 63 66 68 70  Resp: 16 16 (!) 26 14  Temp:      TempSrc:      SpO2: 98% 95% 92% 97%  Weight:    72.9 kg  Height:        Intake/Output Summary (Last 24 hours) at 07/02/2024 0711 Last data filed at 07/02/2024 0500 Gross per 24 hour  Intake 1306.48 ml  Output 715 ml  Net 591.48 ml   Filed Weights   06/30/24 0500 07/01/24 0500 07/02/24 0500  Weight: 73.6 kg 74.2 kg 72.9 kg    Physical Exam    GEN- NAD, Alert and oriented  Lungs- Clear to ausculation bilaterally, normal work of breathing Cardiac- Regular rate and rhythm (VP), no murmurs, rubs or gallops. Sternal wound clean / dry. Left chest PPM site covered with dressing.  GI- soft, NT, ND, + BS Extremities- no clubbing or cyanosis. No edema  Telemetry    VP 60-70's (personally reviewed)  Hospital Course    Kyle Matthews is a 78 y.o. male with PMH of severe AS s/p AVR with 23 mm Edwards Inspiris Resilia pericardial valve in 11/24, CAD s/p CABG (LIMA to LAD, SVG to Ramus) at time of AVR, post-op AF, HTN, HLD, CVA, COPD, tobacco use, anemia, BPH s/p robot-assisted prostatectomy in 06/25.   Admitted 06/12/24 for bioprosthetic valve endocarditis. MRI of brain on initial work up showed L occipital lobe infarct.  He underwent redo sternotomy with Bentall procedure and tricuspid valve replacement (27 mm MDT Mosaic porcine valve), with CABG x1. 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 S/p PPM (Atrial lead & CS lead) on 07/01/24.   Assessment & Plan    CHB  -s/p PPM on 07/01/24 > atrial and CS leads  -continue dressing in place until closer to  discharge  -Jamiria Langill arrange EP follow up for device -arm restrictions / teaching reviewed with patient, Timohty Renbarger follow up with further teaching    Staph Epidermis Endocarditis  -ID following > rec's for 6 weeks vancomycin   -surgical cultures negative, fungal cultures pending   CAD  VHD s/p AVR and TVR  S/p CABG 10/2023, redo 06/26/24  -post operative care per TCTS    For questions or updates, please contact [facility] Please consult [email or web address] for contact info under     Signed, Daphne Barrack, NP-C, AGACNP-BC [facility] - Electrophysiology  07/02/2024, 7:11 AM   I have seen and examined this patient with Daphne Barrack.  Agree with above, note added to reflect my findings.  Patient post Abbott dual-chamber pacemaker implanted yesterday.  No acute complaints today.  GEN: No acute distress.   Neck: No JVD Cardiac: RRR, no murmurs, rubs, or gallops.  Respiratory: normal BS bases bilaterally. GI: Soft, nontender, non-distended  MS: No edema; No deformity. Neuro:  Nonfocal  Skin: warm and dry, device site well healed Psych: Normal affect    Complete heart block: Post Abbott dual-chamber pacemaker.  Ventricular lead is in the coronary sinus due to the presence of a bioprosthetic tricuspid valve.  Device functioning appropriately.  Chest x-ray and interrogation without issue.  No further EP workup at this time.  Can pull temporary wires per cardiac surgery. Staph epidermidis endocarditis: Antibiotics per ID Valvular heart disease: Post AVR and TVR.  Plan per cardiac surgery Coronary artery disease: Plan per cardiac surgery  Bianna Haran M. Jahne Krukowski MD 07/02/2024 9:15 AM

## 2024-07-02 NOTE — Plan of Care (Signed)
 Patient progressing well. Pacemaker inserted yesterday. Pacing wires discontinued today. Patient ambulating well. +BM yesterday and today. Foley discontinued today and creatinine continuing to drop. Orders to transfer to 4E progressive today.

## 2024-07-03 ENCOUNTER — Other Ambulatory Visit: Payer: Self-pay

## 2024-07-03 LAB — BASIC METABOLIC PANEL WITH GFR
Anion gap: 9 (ref 5–15)
BUN: 34 mg/dL — ABNORMAL HIGH (ref 8–23)
CO2: 25 mmol/L (ref 22–32)
Calcium: 7.7 mg/dL — ABNORMAL LOW (ref 8.9–10.3)
Chloride: 101 mmol/L (ref 98–111)
Creatinine, Ser: 0.9 mg/dL (ref 0.61–1.24)
GFR, Estimated: >60 mL/min (ref >60)
Glucose, Bld: 113 mg/dL — ABNORMAL HIGH (ref 70–99)
Potassium: 3.9 mmol/L (ref 3.5–5.1)
Sodium: 135 mmol/L (ref 135–145)

## 2024-07-03 LAB — CBC
HCT: 23.9 % — ABNORMAL LOW (ref 39.0–52.0)
Hemoglobin: 7.8 g/dL — ABNORMAL LOW (ref 13.0–17.0)
MCH: 29.4 pg (ref 26.0–34.0)
MCHC: 32.6 g/dL (ref 30.0–36.0)
MCV: 90.2 fL (ref 80.0–100.0)
Platelets: 144 K/uL — ABNORMAL LOW (ref 150–400)
RBC: 2.65 MIL/uL — ABNORMAL LOW (ref 4.22–5.81)
RDW: 18.4 % — ABNORMAL HIGH (ref 11.5–15.5)
WBC: 8.7 K/uL (ref 4.0–10.5)
nRBC: 0.2 % (ref 0.0–0.2)

## 2024-07-03 MED ORDER — POTASSIUM CHLORIDE CRYS ER 20 MEQ PO TBCR
20.0000 meq | EXTENDED_RELEASE_TABLET | Freq: Every day | ORAL | Status: DC
  Administered 2024-07-03 – 2024-07-04 (×2): 20 meq via ORAL
  Filled 2024-07-03 (×2): qty 1

## 2024-07-03 MED ORDER — SODIUM CHLORIDE 0.9% FLUSH
10.0000 mL | Freq: Two times a day (BID) | INTRAVENOUS | Status: DC
  Administered 2024-07-03: 10 mL

## 2024-07-03 MED ORDER — SODIUM CHLORIDE 0.9% FLUSH: 10.0000 mL | INTRAVENOUS | Status: DC | PRN

## 2024-07-03 MED FILL — Midazolam HCl Inj 2 MG/2ML (Base Equivalent): INTRAMUSCULAR | Qty: 2 | Status: AC

## 2024-07-03 NOTE — Plan of Care (Signed)
 Problem: Education: Goal: Knowledge of the procedure and recovery process will improve Outcome: Progressing   Problem: Bowel/Gastric: Goal: Gastrointestinal status for postoperative course will improve Outcome: Progressing   Problem: Pain Management: Goal: General experience of comfort will improve Outcome: Progressing   Problem: Skin Integrity: Goal: Demonstration of wound healing without infection will improve Outcome: Progressing   Problem: Urinary Elimination: Goal: Ability to avoid or minimize complications of infection will improve Outcome: Progressing Goal: Ability to achieve and maintain urine output will improve Outcome: Progressing Goal: Home care management will improve Outcome: Progressing   Problem: Education: Goal: Knowledge of General Education information will improve Description: Including pain rating scale, medication(s)/side effects and non-pharmacologic comfort measures Outcome: Progressing   Problem: Health Behavior/Discharge Planning: Goal: Ability to manage health-related needs will improve Outcome: Progressing   Problem: Clinical Measurements: Goal: Ability to maintain clinical measurements within normal limits will improve Outcome: Progressing Goal: Will remain free from infection Outcome: Progressing Goal: Diagnostic test results will improve Outcome: Progressing Goal: Respiratory complications will improve Outcome: Progressing Goal: Cardiovascular complication will be avoided Outcome: Progressing   Problem: Activity: Goal: Risk for activity intolerance will decrease Outcome: Progressing   Problem: Nutrition: Goal: Adequate nutrition will be maintained Outcome: Progressing   Problem: Coping: Goal: Level of anxiety will decrease Outcome: Progressing   Problem: Elimination: Goal: Will not experience complications related to bowel motility Outcome: Progressing Goal: Will not experience complications related to urinary  retention Outcome: Progressing   Problem: Pain Managment: Goal: General experience of comfort will improve and/or be controlled Outcome: Progressing   Problem: Safety: Goal: Ability to remain free from injury will improve Outcome: Progressing   Problem: Skin Integrity: Goal: Risk for impaired skin integrity will decrease Outcome: Progressing   Problem: Education: Goal: Knowledge of disease or condition will improve Outcome: Progressing Goal: Knowledge of secondary prevention will improve (MUST DOCUMENT ALL) Outcome: Progressing Goal: Knowledge of patient specific risk factors will improve (DELETE if not current risk factor) Outcome: Progressing   Problem: Ischemic Stroke/TIA Tissue Perfusion: Goal: Complications of ischemic stroke/TIA will be minimized Outcome: Progressing   Problem: Coping: Goal: Will verbalize positive feelings about self Outcome: Progressing Goal: Will identify appropriate support needs Outcome: Progressing   Problem: Health Behavior/Discharge Planning: Goal: Ability to manage health-related needs will improve Outcome: Progressing Goal: Goals will be collaboratively established with patient/family Outcome: Progressing   Problem: Self-Care: Goal: Ability to participate in self-care as condition permits will improve Outcome: Progressing Goal: Verbalization of feelings and concerns over difficulty with self-care will improve Outcome: Progressing Goal: Ability to communicate needs accurately will improve Outcome: Progressing   Problem: Nutrition: Goal: Risk of aspiration will decrease Outcome: Progressing Goal: Dietary intake will improve Outcome: Progressing   Problem: Education: Goal: Understanding of CV disease, CV risk reduction, and recovery process will improve Outcome: Progressing Goal: Individualized Educational Video(s) Outcome: Progressing   Problem: Activity: Goal: Ability to return to baseline activity level will  improve Outcome: Progressing   Problem: Cardiovascular: Goal: Ability to achieve and maintain adequate cardiovascular perfusion will improve Outcome: Progressing Goal: Vascular access site(s) Level 0-1 will be maintained Outcome: Progressing   Problem: Health Behavior/Discharge Planning: Goal: Ability to safely manage health-related needs after discharge will improve Outcome: Progressing   Problem: Education: Goal: Will demonstrate proper wound care and an understanding of methods to prevent future damage Outcome: Progressing Goal: Knowledge of disease or condition will improve Outcome: Progressing Goal: Knowledge of the prescribed therapeutic regimen will improve Outcome: Progressing Goal: Individualized Educational Video(s) Outcome:  Progressing   Problem: Activity: Goal: Risk for activity intolerance will decrease Outcome: Progressing   Problem: Cardiac: Goal: Will achieve and/or maintain hemodynamic stability Outcome: Progressing

## 2024-07-03 NOTE — Progress Notes (Signed)
 Peripherally Inserted Central Catheter Placement  The IV Nurse has discussed with the patient and/or persons authorized to consent for the patient, the purpose of this procedure and the potential benefits and risks involved with this procedure.  The benefits include less needle sticks, lab draws from the catheter, and the patient may be discharged home with the catheter. Risks include, but not limited to, infection, bleeding, blood clot (thrombus formation), and puncture of an artery; nerve damage and irregular heartbeat and possibility to perform a PICC exchange if needed/ordered by physician.  Alternatives to this procedure were also discussed.  Bard Power PICC patient education guide, fact sheet on infection prevention and patient information card has been provided to patient /or left at bedside.    PICC Placement Documentation  PICC Single Lumen 07/03/24 Right Basilic 40 cm 0 cm (Active)  Indication for Insertion or Continuance of Line Home intravenous therapies (PICC only) 07/03/24 0900  Exposed Catheter (cm) 0 cm 07/03/24 0900  Site Assessment Clean, Dry, Intact 07/03/24 0900  Line Status Flushed;Saline locked;Blood return noted 07/03/24 0900  Dressing Type Transparent;Securing device 07/03/24 0900  Dressing Status Antimicrobial disc/dressing in place;Clean, Dry, Intact 07/03/24 0900  Line Care Connections checked and tightened 07/03/24 0900  Line Adjustment (NICU/IV Team Only) No 07/03/24 0900  Dressing Intervention New dressing;Adhesive placed at insertion site (IV team only) 07/03/24 0900  Dressing Change Due 07/10/24 07/03/24 0900       Ethyl Priestly Renee 07/03/2024, 9:54 AM

## 2024-07-03 NOTE — Progress Notes (Signed)
      [address]       [name] [address]             [phone]      2 Days Post-Op Procedure(s) (LRB): PACEMAKER IMPLANT (N/A)  Subjective:  Patient stable, continues to do well.    Objective: Vital signs in last 24 hours: Temp:  [98.2 F (36.8 C)-98.6 F (37 C)] 98.6 F (37 C) (07/16 0307) Pulse Rate:  [69-87] 77 (07/16 0307) Cardiac Rhythm: Normal sinus rhythm;Ventricular paced (07/16 0019) Resp:  [15-21] 18 (07/16 0307) BP: (99-148)/(41-85) 131/78 (07/16 0307) SpO2:  [91 %-98 %] 94 % (07/16 0307)  Intake/Output from previous day: 07/15 0701 - 07/16 0700 In: 938.7 [P.O.:840; IV Piggyback:98.7] Out: 1250 [Urine:1250]  General appearance: alert, cooperative, and no distress Heart: regular rate and rhythm and paced Lungs: diminished breath sounds bibasilar Abdomen: soft, non-tender; bowel sounds normal; no masses,  no organomegaly Extremities: edema trace Wound: clean and dry  Lab Results: Recent Labs    07/02/24 0529 07/03/24 0316  WBC 8.4 8.7  HGB 7.9* 7.8*  HCT 24.4* 23.9*  PLT 139* 144*   BMET:  Recent Labs    07/02/24 0529 07/03/24 0316  NA 135 135  K 3.8 3.9  CL 101 101  CO2 22 25  GLUCOSE 106* 113*  BUN 45* 34*  CREATININE 1.23 0.90  CALCIUM  7.6* 7.7*    PT/INR: No results for input(s): LABPROT, INR in the last 72 hours. ABG    Component Value Date/Time   PHART 7.361 06/27/2024 1259   HCO3 22.9 06/27/2024 1259   TCO2 24 06/27/2024 1259   ACIDBASEDEF 2.0 06/27/2024 1259   O2SAT 77 06/30/2024 0601   CBG (last 3)  Recent Labs    07/01/24 1550 07/02/24 0632 07/02/24 1133  GLUCAP 89 98 118*    Assessment/Plan: S/P Procedure(s) (LRB): PACEMAKER IMPLANT (N/A)  CV-  V. Paced, Hypotension resolved- Midodrine  has been discontinued Pulm- off oxygen, CXR previously with trace left pleural effusion.. continue IS AKI- resolved, creatinine is now normal, K is normal... continue Lasix , potassium ID- Endocarditis  remains afebrile, OR cultures are negative... PICC line ordered to be placed... continue ABX Expected post operative anemia, stable.. continue iron  Deconditioning- mild, continue PT/OT no home recommendations at this time Dispo- patient doing very well, ordered PICC line placement today.. if remains clinically stable for possible d/c in the next 24-48 hours   LOS: 18 days    Rocky Shad, PA-C 07/03/2024

## 2024-07-03 NOTE — Progress Notes (Signed)
 CARDIAC REHAB PHASE I   PRE:  Rate/Rhythm: pacing    BP: sitting 137/73    SpO2: 95 RA  MODE:  Ambulation: 120  ft   POST:  Rate/Rhythm: 70 pacing    BP: sitting 143/73     SpO2: 95 RA   Pt tolerated fair. Used RW and CGA. Minor imbalance on feet. Fatigue with distance. He has RW at home.   Discussed with pt IS, sternal precautions, diet, exercise guidelines,  and CRPII.  Pt receptive. Will refer to [facility] CRPII.  [phone]  Aliene Aris BS, ACSM-CEP 07/03/2024 2:10 PM

## 2024-07-04 ENCOUNTER — Encounter (HOSPITAL_COMMUNITY): Payer: Self-pay | Admitting: Anesthesiology

## 2024-07-04 DIAGNOSIS — Z950 Presence of cardiac pacemaker: Secondary | ICD-10-CM

## 2024-07-04 LAB — CBC
HCT: 26.4 % — ABNORMAL LOW (ref 39.0–52.0)
Hemoglobin: 8.4 g/dL — ABNORMAL LOW (ref 13.0–17.0)
MCH: 29.7 pg (ref 26.0–34.0)
MCHC: 31.8 g/dL (ref 30.0–36.0)
MCV: 93.3 fL (ref 80.0–100.0)
Platelets: 182 K/uL (ref 150–400)
RBC: 2.83 MIL/uL — ABNORMAL LOW (ref 4.22–5.81)
RDW: 19 % — ABNORMAL HIGH (ref 11.5–15.5)
WBC: 10.1 K/uL (ref 4.0–10.5)
nRBC: 0.2 % (ref 0.0–0.2)

## 2024-07-04 LAB — ECHO INTRAOPERATIVE TEE
AR max vel: 0.89 cm2
AV Area VTI: 0.93 cm2
AV Area mean vel: 0.94 cm2
AV Mean grad: 16 mmHg
AV Peak grad: 31.6 mmHg
Ao pk vel: 2.81 m/s
Height: 62 in
Weight: 2480 [oz_av]

## 2024-07-04 LAB — BASIC METABOLIC PANEL WITH GFR
Anion gap: 8 (ref 5–15)
BUN: 24 mg/dL — ABNORMAL HIGH (ref 8–23)
CO2: 25 mmol/L (ref 22–32)
Calcium: 8 mg/dL — ABNORMAL LOW (ref 8.9–10.3)
Chloride: 104 mmol/L (ref 98–111)
Creatinine, Ser: 0.74 mg/dL (ref 0.61–1.24)
GFR, Estimated: >60 mL/min (ref >60)
Glucose, Bld: 106 mg/dL — ABNORMAL HIGH (ref 70–99)
Potassium: 4.3 mmol/L (ref 3.5–5.1)
Sodium: 137 mmol/L (ref 135–145)

## 2024-07-04 MED ORDER — POTASSIUM CHLORIDE CRYS ER 20 MEQ PO TBCR: 20.0000 meq | EXTENDED_RELEASE_TABLET | Freq: Every day | ORAL | Status: DC

## 2024-07-04 MED ORDER — AMLODIPINE BESYLATE 5 MG PO TABS: 5.0000 mg | ORAL_TABLET | Freq: Every day | ORAL | 3 refills | Status: DC

## 2024-07-04 MED ORDER — ACETAMINOPHEN 325 MG PO TABS: 325.0000 mg | ORAL_TABLET | ORAL | Status: DC | PRN

## 2024-07-04 MED ORDER — FUROSEMIDE 40 MG PO TABS: 40.0000 mg | ORAL_TABLET | Freq: Every day | ORAL | 0 refills | Status: DC

## 2024-07-04 MED ORDER — ROSUVASTATIN CALCIUM 10 MG PO TABS: 10.0000 mg | ORAL_TABLET | Freq: Every day | ORAL | 3 refills | Status: DC

## 2024-07-04 MED ORDER — FUROSEMIDE 20 MG PO TABS: 20.0000 mg | ORAL_TABLET | Freq: Every day | ORAL | Status: DC | PRN

## 2024-07-04 MED ORDER — TRAMADOL HCL 50 MG PO TABS: 50.0000 mg | ORAL_TABLET | Freq: Four times a day (QID) | ORAL | 0 refills | Status: DC | PRN

## 2024-07-04 MED ORDER — AMLODIPINE BESYLATE 5 MG PO TABS
5.0000 mg | ORAL_TABLET | Freq: Every day | ORAL | Status: DC
  Administered 2024-07-04: 5 mg via ORAL
  Filled 2024-07-04: qty 1

## 2024-07-04 MED FILL — Heparin Sodium (Porcine) Inj 1000 Unit/ML: INTRAMUSCULAR | Qty: 30 | Status: AC

## 2024-07-04 MED FILL — Lidocaine HCl Local Preservative Free (PF) Inj 2%: INTRAMUSCULAR | Qty: 14 | Status: AC

## 2024-07-04 MED FILL — Sodium Chloride IV Soln 0.9%: INTRAVENOUS | Qty: 2000 | Status: AC

## 2024-07-04 MED FILL — Sodium Bicarbonate IV Soln 8.4%: INTRAVENOUS | Qty: 100 | Status: AC

## 2024-07-04 MED FILL — Electrolyte-R (PH 7.4) Solution: INTRAVENOUS | Qty: 4000 | Status: AC

## 2024-07-04 MED FILL — Heparin Sodium (Porcine) Inj 1000 Unit/ML: INTRAMUSCULAR | Qty: 40 | Status: AC

## 2024-07-04 MED FILL — Mannitol IV Soln 20%: INTRAVENOUS | Qty: 500 | Status: AC

## 2024-07-04 MED FILL — Calcium Chloride Inj 10%: INTRAVENOUS | Qty: 10 | Status: AC

## 2024-07-04 NOTE — Progress Notes (Signed)
 Pt discharging home with wife.  HH education provided about at home IV infusions prior to DC.  PICC line flushed and capped for home by IV RN.  All other instructions given and reviewed, all questions answered.

## 2024-07-04 NOTE — TOC Transition Note (Addendum)
 Transition of Care [facility]) - Discharge Note   Patient Details  Name: Kyle Matthews MRN: [id number] Date of Birth: [birth year]  Transition of Care [facility]) CM/SW Contact:  Justina Delcia Czar, RN Phone Number: [phone] 07/04/2024, 9:01 AM   Clinical Narrative:     [employer] rep, Pam to make aware of scheduled dc home today. Pam RN will provide teaching to pt and wife at 330 pm today for Home IV abx.   Contacted [facility] rep, Shylise and she will notifiy team pt will dc home today with Home IV abx.   Spoke to [employer], Zebedee, will need PICC line care added to HHRN orders. Pt would like HHPT, HHPT can do a home safety evaluation.  Pt has RW at home. Reviewed daily weight and low sodium/heart healthy diet .  PCP hospital follow up scheduled 07/12/2024 at 1130 am.     Final next level of care: Home w Home Health Services Barriers to Discharge: No Barriers Identified   Patient Goals and CMS Choice Patient states their goals for this hospitalization and ongoing recovery are:: plan to return home with spouse once stable.          Discharge Placement                       Discharge Plan and Services Additional resources added to the After Visit Summary for   In-house Referral: NA Discharge Planning Services: CM Consult Post Acute Care Choice: NA                    HH Arranged: RN HH Agency: Surveyor, [employer], [facility] Date HH Agency Contacted: 07/04/24 Time HH Agency Contacted: 0901 Representative spoke with at [facility] Agency: Pam RN  Social Drivers of Health (SDOH) Interventions SDOH Screenings   Food Insecurity: No Food Insecurity (06/12/2024)  Recent Concern: Food Insecurity - Food Insecurity Present (05/22/2024)  Housing: Low Risk  (06/12/2024)  Transportation Needs: No Transportation Needs (06/12/2024)  Utilities: Not At Risk (06/12/2024)  Depression (PHQ2-9): High Risk (02/12/2024)  Social Connections: Moderately Integrated (06/12/2024)   Tobacco Use: High Risk (06/26/2024)     Readmission Risk Interventions    11/08/2023   10:21 AM 10/25/2023    3:42 PM  Readmission Risk Prevention Plan  Transportation Screening Complete Complete  PCP or Specialist Appt within 3-5 Days  Complete  HRI or Home Care Consult  Complete  Palliative Care Screening  Not Applicable  Medication Review (RN Care Manager)  Complete

## 2024-07-04 NOTE — Progress Notes (Signed)
      [address]       [name] [address]             [phone]      3 Days Post-Op Procedure(s) (LRB): PACEMAKER IMPLANT (N/A)  Subjective:  Patient doing well.  Sitting up in chair, hopeful to go home.  Working with PT/OT w/o issue.  PICC line placed yesterday  Objective: Vital signs in last 24 hours: Temp:  [98 F (36.7 C)-98.8 F (37.1 C)] 98.4 F (36.9 C) (07/17 0300) Pulse Rate:  [69-70] 70 (07/17 0300) Cardiac Rhythm: Ventricular paced (07/16 1900) Resp:  [17-24] 17 (07/17 0300) BP: (134-150)/(72-79) 150/72 (07/17 0300) SpO2:  [93 %-100 %] 93 % (07/17 0300) Weight:  [72.5 kg] 72.5 kg (07/17 0500)  Intake/Output from previous day: 07/16 0701 - 07/17 0700 In: 100 [IV Piggyback:100] Out: 905 [Urine:905]  General appearance: alert, cooperative, and no distress Heart: regular rate and rhythm and paced Lungs: diminished breath sounds bibasilar Abdomen: soft, non-tender; bowel sounds normal; no masses,  no organomegaly Extremities: edema minimal Wound: clean and dry  Lab Results: Recent Labs    07/03/24 0316 07/04/24 0322  WBC 8.7 10.1  HGB 7.8* 8.4*  HCT 23.9* 26.4*  PLT 144* 182   BMET:  Recent Labs    07/03/24 0316 07/04/24 0322  NA 135 137  K 3.9 4.3  CL 101 104  CO2 25 25  GLUCOSE 113* 106*  BUN 34* 24*  CREATININE 0.90 0.74  CALCIUM  7.7* 8.0*    PT/INR: No results for input(s): LABPROT, INR in the last 72 hours. ABG    Component Value Date/Time   PHART 7.361 06/27/2024 1259   HCO3 22.9 06/27/2024 1259   TCO2 24 06/27/2024 1259   ACIDBASEDEF 2.0 06/27/2024 1259   O2SAT 77 06/30/2024 0601   CBG (last 3)  Recent Labs    07/01/24 1550 07/02/24 0632 07/02/24 1133  GLUCAP 89 98 118*    Assessment/Plan: S/P Procedure(s) (LRB): PACEMAKER IMPLANT (N/A)  CV- S/p PPM, Paced rhythm, hypotension resolved, now with elevated BP with ranges into the 150s- will resume home Norvasx Pulm- off oxygen, no acute issues,  continue IS Renal- creatinine continues to improve, will continue Lasix , potassium for now Expected post operative blood loss anemia- Hgb is improved up to 8.4, continue iron  ID- afebrile, no leukocytosis.SABRA will continue ABX per ID recommendations Diso- patient stable, will d/c home today   LOS: 19 days   Rocky Shad, PA-C 07/04/2024

## 2024-07-04 NOTE — Progress Notes (Signed)
 EP Follow Up     Discharge teaching completed for arm restrictions, PPM site care and follow up.  Questions answered.  EP follow up in place.    Daphne Barrack, NP-C, AGACNP-BC [facility] - Electrophysiology  07/04/2024, 9:31 AM

## 2024-07-04 NOTE — TOC Progression Note (Signed)
 Transition of Care (TOC) - Progression Note  Rayfield Gobble RN, BSN Inpatient Care Management Unit 4E- RN Case Manager See Treatment Team for direct phone #   Patient Details  Name: Kyle Matthews MRN: [id number] Date of Birth: 08-29-1946  Transition of Care [facility]) CM/SW Contact  Gobble, Rayfield Hurst, RN Phone Number: 07/04/2024, 10:00 AM  Clinical Narrative:    Note pt will need 6wks of IV abx, PICC line ordered for today.  [facility] following for [facility] needs- have confirmed with liaison that they can service for [facility] and IV abx needs.   Also spoke with Pam at [employer] regarding home IV abx- OPAT has been placed- Pam will follow up with pt/wife for bedside education. She is hopeful to schedule for this afternoon in prep for possible d/c in 1-2 days.   No DME needs noted.   Expected Discharge Plan: Home w Home Health Services Barriers to Discharge: No Barriers Identified  Expected Discharge Plan and Services In-house Referral: NA Discharge Planning Services: CM Consult Post Acute Care Choice: NA Living arrangements for the past 2 months: Single Family Home Expected Discharge Date: 07/04/24                         HH Arranged: RN HH Agency: Surveyor, [employer], [facility] Date HH Agency Contacted: 07/04/24 Time HH Agency Contacted: 0901 Representative spoke with at [facility] Agency: Pam RN   Social Determinants of Health (SDOH) Interventions SDOH Screenings   Food Insecurity: No Food Insecurity (06/12/2024)  Recent Concern: Food Insecurity - Food Insecurity Present (05/22/2024)  Housing: Low Risk  (06/12/2024)  Transportation Needs: No Transportation Needs (06/12/2024)  Utilities: Not At Risk (06/12/2024)  Depression (PHQ2-9): High Risk (02/12/2024)  Social Connections: Moderately Integrated (06/12/2024)  Tobacco Use: High Risk (06/26/2024)    Readmission Risk Interventions    11/08/2023   10:21 AM 10/25/2023    3:42 PM  Readmission Risk Prevention Plan  Transportation  Screening Complete Complete  PCP or Specialist Appt within 3-5 Days  Complete  HRI or Home Care Consult  Complete  Palliative Care Screening  Not Applicable  Medication Review (RN Care Manager)  Complete

## 2024-07-05 ENCOUNTER — Other Ambulatory Visit: Payer: Self-pay | Admitting: *Deleted

## 2024-07-05 ENCOUNTER — Telehealth: Payer: Self-pay

## 2024-07-05 DIAGNOSIS — Z48812 Encounter for surgical aftercare following surgery on the circulatory system: Secondary | ICD-10-CM | POA: Diagnosis not present

## 2024-07-05 DIAGNOSIS — Z950 Presence of cardiac pacemaker: Secondary | ICD-10-CM | POA: Diagnosis not present

## 2024-07-05 DIAGNOSIS — F17210 Nicotine dependence, cigarettes, uncomplicated: Secondary | ICD-10-CM | POA: Diagnosis not present

## 2024-07-05 DIAGNOSIS — I079 Rheumatic tricuspid valve disease, unspecified: Secondary | ICD-10-CM | POA: Diagnosis not present

## 2024-07-05 DIAGNOSIS — I330 Acute and subacute infective endocarditis: Secondary | ICD-10-CM | POA: Diagnosis not present

## 2024-07-05 DIAGNOSIS — N138 Other obstructive and reflux uropathy: Secondary | ICD-10-CM | POA: Diagnosis not present

## 2024-07-05 DIAGNOSIS — N179 Acute kidney failure, unspecified: Secondary | ICD-10-CM | POA: Diagnosis not present

## 2024-07-05 DIAGNOSIS — Z452 Encounter for adjustment and management of vascular access device: Secondary | ICD-10-CM | POA: Diagnosis not present

## 2024-07-05 DIAGNOSIS — I5032 Chronic diastolic (congestive) heart failure: Secondary | ICD-10-CM | POA: Diagnosis not present

## 2024-07-05 DIAGNOSIS — T826XXA Infection and inflammatory reaction due to cardiac valve prosthesis, initial encounter: Secondary | ICD-10-CM | POA: Diagnosis not present

## 2024-07-05 DIAGNOSIS — Z7982 Long term (current) use of aspirin: Secondary | ICD-10-CM | POA: Diagnosis not present

## 2024-07-05 DIAGNOSIS — I2510 Atherosclerotic heart disease of native coronary artery without angina pectoris: Secondary | ICD-10-CM | POA: Diagnosis not present

## 2024-07-05 DIAGNOSIS — Z952 Presence of prosthetic heart valve: Secondary | ICD-10-CM | POA: Diagnosis not present

## 2024-07-05 DIAGNOSIS — Z8673 Personal history of transient ischemic attack (TIA), and cerebral infarction without residual deficits: Secondary | ICD-10-CM | POA: Diagnosis not present

## 2024-07-05 DIAGNOSIS — Z951 Presence of aortocoronary bypass graft: Secondary | ICD-10-CM | POA: Diagnosis not present

## 2024-07-05 DIAGNOSIS — I110 Hypertensive heart disease with heart failure: Secondary | ICD-10-CM | POA: Diagnosis not present

## 2024-07-05 DIAGNOSIS — D649 Anemia, unspecified: Secondary | ICD-10-CM | POA: Diagnosis not present

## 2024-07-05 DIAGNOSIS — Z792 Long term (current) use of antibiotics: Secondary | ICD-10-CM | POA: Diagnosis not present

## 2024-07-05 MED ORDER — APIXABAN 5 MG PO TABS: 5.0000 mg | ORAL_TABLET | Freq: Two times a day (BID) | ORAL | 1 refills | Status: DC

## 2024-07-05 NOTE — Progress Notes (Signed)
 Eliquis 5mg  PO BID ordered as directed for patient and sent to their preferred pharmacy

## 2024-07-05 NOTE — Telephone Encounter (Signed)
 Alert received from [employer]:  Alert remote transmission: Long AT/AF Presenting rhythm AF/BiV pace in progress from 7/16 @06 :45, hx of PAF, no OAC on MAR

## 2024-07-05 NOTE — Telephone Encounter (Signed)
 Called patient back and notified him of the AF and that the provider wants to start him on Eliquis and also get him in to be seen at the AF clinic  Patient agreeable to these things  Asked patient if he is still having issues with the bleeding from his prostate and he said No.   Eliquis 5mg  PO BID order placed and sent to patient's preferred pharmacy   Told patient we would call him first thing Monday get him scheduled in AF clinic since it is after 5 and the schedule team has left  Patient educated on risks with taking Eliquis and to stop taking immediately and go to the hospital if he notices bright red blood in his urine over the weekend.  Patient verbalized understanding of all education and instructions provided to him by this RN and was very appreciative of the call   All questions and concerns have been addressed at this time

## 2024-07-05 NOTE — Telephone Encounter (Signed)
 Attempted outreach to patient. No answer. After one ring it went straight to voicemail. Left detailed message to call back in next 30 minutes or first thing Monday morning.

## 2024-07-05 NOTE — Telephone Encounter (Signed)
 Patient is returning call.

## 2024-07-08 ENCOUNTER — Inpatient Hospital Stay: Payer: Self-pay | Admitting: Infectious Disease

## 2024-07-08 DIAGNOSIS — I2510 Atherosclerotic heart disease of native coronary artery without angina pectoris: Secondary | ICD-10-CM | POA: Diagnosis not present

## 2024-07-08 DIAGNOSIS — D649 Anemia, unspecified: Secondary | ICD-10-CM | POA: Diagnosis not present

## 2024-07-08 DIAGNOSIS — I5032 Chronic diastolic (congestive) heart failure: Secondary | ICD-10-CM | POA: Diagnosis not present

## 2024-07-08 DIAGNOSIS — Z950 Presence of cardiac pacemaker: Secondary | ICD-10-CM | POA: Diagnosis not present

## 2024-07-08 DIAGNOSIS — Z48812 Encounter for surgical aftercare following surgery on the circulatory system: Secondary | ICD-10-CM | POA: Diagnosis not present

## 2024-07-08 DIAGNOSIS — N138 Other obstructive and reflux uropathy: Secondary | ICD-10-CM | POA: Diagnosis not present

## 2024-07-08 DIAGNOSIS — Z952 Presence of prosthetic heart valve: Secondary | ICD-10-CM | POA: Diagnosis not present

## 2024-07-08 DIAGNOSIS — F17210 Nicotine dependence, cigarettes, uncomplicated: Secondary | ICD-10-CM | POA: Diagnosis not present

## 2024-07-08 DIAGNOSIS — N179 Acute kidney failure, unspecified: Secondary | ICD-10-CM | POA: Diagnosis not present

## 2024-07-08 DIAGNOSIS — I509 Heart failure, unspecified: Secondary | ICD-10-CM | POA: Diagnosis not present

## 2024-07-08 DIAGNOSIS — Z7982 Long term (current) use of aspirin: Secondary | ICD-10-CM | POA: Diagnosis not present

## 2024-07-08 DIAGNOSIS — Z8673 Personal history of transient ischemic attack (TIA), and cerebral infarction without residual deficits: Secondary | ICD-10-CM | POA: Diagnosis not present

## 2024-07-08 DIAGNOSIS — Z452 Encounter for adjustment and management of vascular access device: Secondary | ICD-10-CM | POA: Diagnosis not present

## 2024-07-08 DIAGNOSIS — Z792 Long term (current) use of antibiotics: Secondary | ICD-10-CM | POA: Diagnosis not present

## 2024-07-08 DIAGNOSIS — I110 Hypertensive heart disease with heart failure: Secondary | ICD-10-CM | POA: Diagnosis not present

## 2024-07-08 DIAGNOSIS — Z951 Presence of aortocoronary bypass graft: Secondary | ICD-10-CM | POA: Diagnosis not present

## 2024-07-08 NOTE — Telephone Encounter (Signed)
 Called and notified patient of his AF clinic appointment on 07/17/24 @ 1530  Patient given address and location of the new AF clinic for accurate directions  Patient confirmed correct date, time, and location of appointment via read back process to this RN  Patient also confirmed obtaining and initiating Eliquis  presciption over the weekend and reported no signs or symptoms of bleeding thus far to this RN  This RN instructed the patient to continue monitoring for s/s of bleeding and to be more mindful of stationary objects while ambulating until his appointment on 07/17/24  Patient verbalized understanding of instructions given to them by this RN  All questions and concerns addressed at this time   Patient very appreciative of phone call

## 2024-07-08 NOTE — Telephone Encounter (Signed)
 Pt returning call to nurse

## 2024-07-09 ENCOUNTER — Other Ambulatory Visit: Payer: Self-pay

## 2024-07-09 ENCOUNTER — Telehealth: Payer: Self-pay

## 2024-07-09 ENCOUNTER — Other Ambulatory Visit: Payer: Self-pay | Admitting: Physician Assistant

## 2024-07-09 DIAGNOSIS — F17210 Nicotine dependence, cigarettes, uncomplicated: Secondary | ICD-10-CM | POA: Diagnosis not present

## 2024-07-09 DIAGNOSIS — I639 Cerebral infarction, unspecified: Secondary | ICD-10-CM

## 2024-07-09 MED ORDER — POTASSIUM CHLORIDE CRYS ER 20 MEQ PO TBCR: 20.0000 meq | EXTENDED_RELEASE_TABLET | Freq: Every day | ORAL | 1 refills | Status: DC

## 2024-07-09 NOTE — Transitions of Care (Post Inpatient/ED Visit) (Signed)
 Stroke Discharge Follow-up   07/09/2024 Name:  Kyle Matthews MRN:  [id number] DOB:  04-19-1946  Subjective: Kyle Matthews is a 78 y.o. year old male who is a primary care patient of Verena Mems, MD An Emmi alert was received indicating patient responded to questions: Questions/problems with meds?. I reached out by phone to follow up on the alert and spoke to Patient. Patient reports that he does not have his prescription for potassium.  Reports that he has been home 5 days. Confirmed that he is taking his Lasix .  Home health is active for IV antibiotics.  Patient reports labs were drawn yesterday.  Patient denies any stroke symptoms at this time.  States that his energy level is getting better. Patient reports pink/orange  urine today.  Patient states that he started Eliquis  after receiving a call on 07/05/2024 from cardiology stating he is in a fib.   Care Management Interventions:  Reviewed in depth chart about medication dispense and orders. Confirm with patient that he does not have potassium. Call to [facility] health spoke with Supervisor Russell-  Inquired about Potassium and informed supervisor that patient does not have RX.  Supervisor confirms labs were drawn yesterday and are pending.  Expressed concern about patient going back into afib as of Friday 07/05/2024. Supervisor states that PT is going out today and she will ask PT to review medications today in the home.  I requested supervisor call me back and to call Lab core about potassium results.   3pm.  Call back from Aleshia who states patient does not have potassium in the home and the PT called MD office and is having RX called in. 3:15pm.  Placed call to [facility] and confirmed that RX was received.  3:20pm Spoke with patient who reports he will pick up RX today.  Encouraged patient to start taking his medications today. States now urine is light orange color with no pink tinge.    Message sent to PCP, cardiology and CVTS to inform of the  above.   Patient denies any additional needs. PCP follow up planned for 07/12/2024   Follow up plan: No further intervention required. Message sent to the PCP, Cardiology and CVTS Alan Ee, RN, BSN, CEN [facility]- Transition of Care Team.  [facility] [phone]

## 2024-07-09 NOTE — Progress Notes (Signed)
 Elspeth, PT with [facility] contacted the office to state patient did not receive his prescription for Potassium. Patient is s/p TVR with Dr. Lucas 06/26/24. Will resend prescription into patient's preferred pharmacy. Returned call to Elspeth and he is aware and acknowledged receipt.

## 2024-07-12 ENCOUNTER — Telehealth: Payer: Self-pay

## 2024-07-12 DIAGNOSIS — T826XXA Infection and inflammatory reaction due to cardiac valve prosthesis, initial encounter: Secondary | ICD-10-CM | POA: Diagnosis not present

## 2024-07-12 DIAGNOSIS — I330 Acute and subacute infective endocarditis: Secondary | ICD-10-CM | POA: Diagnosis not present

## 2024-07-12 DIAGNOSIS — E8770 Fluid overload, unspecified: Secondary | ICD-10-CM | POA: Diagnosis not present

## 2024-07-12 DIAGNOSIS — D62 Acute posthemorrhagic anemia: Secondary | ICD-10-CM | POA: Diagnosis not present

## 2024-07-12 DIAGNOSIS — Z79899 Other long term (current) drug therapy: Secondary | ICD-10-CM | POA: Diagnosis not present

## 2024-07-12 DIAGNOSIS — I079 Rheumatic tricuspid valve disease, unspecified: Secondary | ICD-10-CM | POA: Diagnosis not present

## 2024-07-12 NOTE — Transitions of Care (Post Inpatient/ED Visit) (Addendum)
 Stroke Discharge Follow-up   07/12/2024 Name:  Kyle Matthews MRN:  [id number] DOB:  [birth year]  Subjective: Kyle Matthews is a 78 y.o. year old male who is a primary care patient of Verena Mems, MD  In basket message received from cardiology. See below.   Lucien Orren SAILOR, PA-C  Rose, Isaah Furry U, RN; Linden, Dorise POUR, MD Please have him monitor urine and let us  know if this does not clear up in a day or two. I see him 7/30 and we can address then.  He needs to stay on his Eliquis  for now for stroke prevention.  Orren SAILOR Lucien, PA-C   Care Management Interventions: Attempted to reach patient to inform. No answer. Left  a message for patient to call me back  Follow up plan: waiting for a return call  07/12/2024   1305: Patient returned call and I reviewed the above. Patient just leaving PCP office having additional labs.  Patient denies any additional questions or concerns.   Alan Ee, RN, BSN, CEN [employer]- Transition of Care Team.  [facility] [phone]

## 2024-07-15 DIAGNOSIS — Z952 Presence of prosthetic heart valve: Secondary | ICD-10-CM | POA: Diagnosis not present

## 2024-07-15 DIAGNOSIS — Z950 Presence of cardiac pacemaker: Secondary | ICD-10-CM | POA: Diagnosis not present

## 2024-07-15 DIAGNOSIS — Z7982 Long term (current) use of aspirin: Secondary | ICD-10-CM | POA: Diagnosis not present

## 2024-07-15 DIAGNOSIS — I110 Hypertensive heart disease with heart failure: Secondary | ICD-10-CM | POA: Diagnosis not present

## 2024-07-15 DIAGNOSIS — F17210 Nicotine dependence, cigarettes, uncomplicated: Secondary | ICD-10-CM | POA: Diagnosis not present

## 2024-07-15 DIAGNOSIS — Z951 Presence of aortocoronary bypass graft: Secondary | ICD-10-CM | POA: Diagnosis not present

## 2024-07-15 DIAGNOSIS — N179 Acute kidney failure, unspecified: Secondary | ICD-10-CM | POA: Diagnosis not present

## 2024-07-15 DIAGNOSIS — Z452 Encounter for adjustment and management of vascular access device: Secondary | ICD-10-CM | POA: Diagnosis not present

## 2024-07-15 DIAGNOSIS — Z48812 Encounter for surgical aftercare following surgery on the circulatory system: Secondary | ICD-10-CM | POA: Diagnosis not present

## 2024-07-15 DIAGNOSIS — I509 Heart failure, unspecified: Secondary | ICD-10-CM | POA: Diagnosis not present

## 2024-07-15 DIAGNOSIS — Z792 Long term (current) use of antibiotics: Secondary | ICD-10-CM | POA: Diagnosis not present

## 2024-07-15 DIAGNOSIS — Z8673 Personal history of transient ischemic attack (TIA), and cerebral infarction without residual deficits: Secondary | ICD-10-CM | POA: Diagnosis not present

## 2024-07-15 DIAGNOSIS — N138 Other obstructive and reflux uropathy: Secondary | ICD-10-CM | POA: Diagnosis not present

## 2024-07-15 DIAGNOSIS — I5032 Chronic diastolic (congestive) heart failure: Secondary | ICD-10-CM | POA: Diagnosis not present

## 2024-07-15 DIAGNOSIS — I2510 Atherosclerotic heart disease of native coronary artery without angina pectoris: Secondary | ICD-10-CM | POA: Diagnosis not present

## 2024-07-15 DIAGNOSIS — D649 Anemia, unspecified: Secondary | ICD-10-CM | POA: Diagnosis not present

## 2024-07-15 NOTE — Progress Notes (Unsigned)
 Cardiology Office Note   Date:  07/17/2024  ID:  Kyle Matthews, DOB August 29, 1946, MRN [id number] PCP: Verena Mems, MD  [facility] Providers Cardiologist:  Lurena MARLA Red, MD Electrophysiologist:  Soyla Gladis Norton, MD   History of Present Illness Kyle Matthews is a 78 y.o. male with past medical history of severe AS status post AVR with 23 mm Edwards Inspiris Resilia pericardial valve in 09/2023, CAD status post CABG (LIMA to LAD, SVG to ramus) at time of AVR, postop atrial fibrillation, HTN, HLD, history of CVA, COPD, tobacco use, anemia, and history of BPH status post robotic assisted prostatectomy in 05/2024 here for follow-up appointment.  He recently underwent a redo sternotomy and Bentall procedure as well as a CABG x 1 (SVG to RCA) and tricuspid valve replacement with 27 mm Medtronic Mosaic porcine valve.  This was after he presented to [facility] on 05/2024 with chest pain and lower extremity edema and rigors.  Blood cultures grew methylene resistant Staph epidermidis.  Started on antibiotics.  Had MRI of the brain as part initial workup and found to have acute left occipital lobe infarct.  TTE with increased gradients across aortic valve prosthesis.  Transferred to [facility] for TEE to better assess prosthetic valve endocarditis.  Patient is concluded pump time but on follow-up echocardiogram he had preserved EF.  He was on midodrine  for BP support and weaned off of all pressors.  Diuresis was limited by AKI and renal was consulted.  Unfortunately, his conduction system was likely resected during surgery due to the abscess and he was pacer dependent.  Ultimately ended up with a permanent pacemaker placed.  Today he presents for follow-up appointment.   He recently underwent two heart valve replacements and a coronary artery bypass, marking his second heart surgery in seven months. A pacemaker was placed on July 14th. He is on a 12-week course of IV antibiotics via a PICC line, expected to  complete on August 28th.  He experiences fatigue and shortness of breath. He has been on Lasix  40 mg daily since discharge from the hospital two weeks ago, using it as needed. His medication regimen includes Eliquis  5 mg twice daily, aspirin , amlodipine  5 mg, and potassium supplements. He elevates his feet at night to manage edema and uses compression socks with assistance from a home nurse.  He has atrial fibrillation, with episodes noted over the past 13 days. He is not on metoprolol  but continues with his current medication regimen. He feels more fatigued as the day progresses, with his energy levels highest in the morning.  Recent lab work showed normal kidney function and potassium levels, with a normal white blood cell count. His hemoglobin is low at 9.5, which may contribute to his fatigue. He is not currently on midodrine , which was used in the hospital for low blood pressure.  Reports no chest pain, pressure, or tightness. No orthopnea, PND. Reports no palpitations.   Discussed the use of AI scribe software for clinical note transcription with the patient, who gave verbal consent to proceed.  ROS: Pertinent ROS in HPI  Studies Reviewed EKG Interpretation Date/Time:  Wednesday July 17 2024 14:22:27 EDT Ventricular Rate:  70 PR Interval:    QRS Duration:  216 QT Interval:  516 QTC Calculation: 557 R Axis:   210  Text Interpretation: Ventricular-paced rhythm When compared with ECG of 02-Jul-2024 07:20, Vent. rate has decreased BY  13 BPM Confirmed by Lucien Blanc [phone]) on 07/17/2024 2:45:15 PM   IntraOp echocardiogram  06/26/2024 Transesophogeal exam was perform intraoperatively during surgical  procedure.  Patient was closely monitored under general anesthesia during the entirety  of  examination.   Indications:    Tricuspid and Aortic Valve replacement  Performing Phys: [id number] DORISE POUR BARTLE  Diagnosing Phys: Juliene Clinton MD   PROCEDURE: Intraoperative Transesophogeal 3D  imaging performed to better  assess valves.  Complications: No known complications during this procedure.  POST-OP IMPRESSIONS  _ Aorta: A graft was placed in the ascending aorta for repair.  _ Aortic Valve: A bioprosthetic bioprosthetic valve was placed Size; 21mm.  _ Tricuspid Valve: A bioprosthetic bioprosthetic valve was placed,  leaflets are  not freely mobile and leaflets thin Size; 27 mm.   PRE-OP  FINDINGS   Left Ventricle: The left ventricle has normal systolic function, with an  ejection fraction of 55-60%. The cavity size was left ventricular size was  not assessed. There is no increase in left ventricular wall thickness.  There is moderate concentric left  ventricular hypertrophy.   Risk Assessment/Calculations  CHA2DS2-VASc Score = 6   This indicates a 9.7% annual risk of stroke. The patient's score is based upon: CHF History: 0 HTN History: 1 Diabetes History: 0 Stroke History: 2 Vascular Disease History: 1 Age Score: 2 Gender Score: 0            Physical Exam VS:  BP 132/76 (BP Location: Left Arm, Patient Position: Sitting, Cuff Size: Normal)   Pulse (!) 54   Resp 16   Ht 5' 2 (1.575 m)   Wt 158 lb 3.2 oz (71.8 kg)   SpO2 96%   BMI 28.94 kg/m        Wt Readings from Last 3 Encounters:  07/17/24 158 lb 3.2 oz (71.8 kg)  07/17/24 157 lb (71.2 kg)  07/04/24 159 lb 13.3 oz (72.5 kg)    GEN: Well nourished, well developed in no acute distress NECK: No JVD; No carotid bruits CARDIAC: RRR, 4/6 SEM, rubs, gallops RESPIRATORY:  Clear to auscultation without rales, wheezing or rhonchi  ABDOMEN: Soft, non-tender, non-distended EXTREMITIES:  2+ pitting edema ; No deformity   ASSESSMENT AND PLAN  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 - Attempt to expedite echocardiogram for valve  assessment. -continue current medication regimen, BP well controlled today  Atrial fibrillation, status post recent conversion with pacemaker - Follow up with AFib clinic. Asymptomatic today  Peripheral edema with ongoing diuretic management Significant edema, stable kidney function and potassium levels. - Continue 40 mg Lasix  daily for one week. Continue potassium 20 MEQ daily - Consider increasing Lasix  to twice daily if no improvement. - Use compression stockings or Ace wrap. -Recent labs reviewed with preserved kidney function  Anemia following recent cardiac surgery Anemia with hemoglobin at 9.5 g/dL, likely post-surgical.  History of cerebrovascular accident (mini-stroke) without residual deficits Mini-stroke with no residual deficits. - Follow-up with neurologist in September.  CAD status post CABG x 2 on 11/24 with redo status post CABG x 1 on 7/09 No chest pain -continue current medications  CHB status post PPM -No issues with his PPM -appointment today with the Afib clinic    Cardiac Rehabilitation Eligibility Assessment  The patient is NOT ready to start cardiac rehabilitation due to:     PICC LINE IN PLACE  Dispo: He can follow-up in 3 months with MD  Signed, Orren LOISE Fabry, PA-C

## 2024-07-16 ENCOUNTER — Ambulatory Visit: Attending: Internal Medicine

## 2024-07-16 DIAGNOSIS — I442 Atrioventricular block, complete: Secondary | ICD-10-CM | POA: Diagnosis not present

## 2024-07-16 LAB — CUP PACEART INCLINIC DEVICE CHECK
Battery Remaining Longevity: 88 mo
Battery Voltage: 3.02 V
Brady Statistic RA Percent Paced: 0.05 %
Brady Statistic RV Percent Paced: 99.99 %
Date Time Interrogation Session: 20250729124812
Implantable Lead Connection Status: 753985
Implantable Lead Connection Status: 753985
Implantable Lead Implant Date: 20250714
Implantable Lead Implant Date: 20250714
Implantable Lead Location: 753858
Implantable Lead Location: 753859
Implantable Pulse Generator Implant Date: 20250714
Lead Channel Impedance Value: 3187.5 Ohm
Lead Channel Impedance Value: 425 Ohm
Lead Channel Impedance Value: 775 Ohm
Lead Channel Pacing Threshold Amplitude: 0.375 V
Lead Channel Pacing Threshold Amplitude: 0.75 V
Lead Channel Pacing Threshold Amplitude: 0.75 V
Lead Channel Pacing Threshold Pulse Width: 0.5 ms
Lead Channel Pacing Threshold Pulse Width: 0.5 ms
Lead Channel Pacing Threshold Pulse Width: 0.5 ms
Lead Channel Sensing Intrinsic Amplitude: 2.3 mV
Lead Channel Setting Pacing Amplitude: 0.25 V
Lead Channel Setting Pacing Amplitude: 1.375
Lead Channel Setting Pacing Amplitude: 3.5 V
Lead Channel Setting Pacing Pulse Width: 0.1 ms
Lead Channel Setting Pacing Pulse Width: 0.5 ms
Lead Channel Setting Sensing Sensitivity: 4 mV
Pulse Gen Model: 3562
Pulse Gen Serial Number: 8291881

## 2024-07-16 NOTE — Progress Notes (Signed)
 Normal CRT chamber pacemaker wound check. Presenting rhythm: AF/BP 70 . Wound well healed. Routine testing performed. Thresholds, sensing, and impedances consistent with implant measurements. AT/AF burden 93%. Two AMS episodes c/w ongoing AF. Reviewed arm restrictions to continue for 6 weeks total post op.  Pt enrolled in remote follow-up.  Programming Changes: - LV output changed from 5.0V @ 0.50ms to 3.5V @ 0.81ms. - Note put into device --> Do NOT turn on Cap Confirm for LV lead. - NO RV lead DO NOT program RV sensitivity.

## 2024-07-16 NOTE — Patient Instructions (Signed)
   After Your Pacemaker   Monitor your pacemaker site for redness, swelling, and drainage. Call the device clinic at [phone] if you experience these symptoms or fever/chills.  Your incision was closed with Steri-strips or staples:  You may shower 7 days after your procedure and wash your incision with soap and water. Avoid lotions, ointments, or perfumes over your incision until it is well-healed.  You may use a hot tub or a pool after your wound check appointment if the incision is completely closed.  Do not lift, push or pull greater than 10 pounds with the affected arm until 6 weeks after your procedure. UNTIL AFTER AUGUST 25TH. There are no other restrictions in arm movement after your wound check appointment.  You may drive, unless driving has been restricted by your healthcare providers.  Remote monitoring is used to monitor your pacemaker from home. This monitoring is scheduled every 91 days by our office. It allows us  to keep an eye on the functioning of your device to ensure it is working properly. You will routinely see your Electrophysiologist annually (more often if necessary).

## 2024-07-17 ENCOUNTER — Encounter (HOSPITAL_COMMUNITY): Payer: Self-pay | Admitting: Physician Assistant

## 2024-07-17 ENCOUNTER — Other Ambulatory Visit: Payer: Self-pay

## 2024-07-17 ENCOUNTER — Encounter: Payer: Self-pay | Admitting: Physician Assistant

## 2024-07-17 ENCOUNTER — Ambulatory Visit: Attending: Cardiology | Admitting: Physician Assistant

## 2024-07-17 ENCOUNTER — Encounter: Payer: Self-pay | Admitting: Internal Medicine

## 2024-07-17 ENCOUNTER — Ambulatory Visit (INDEPENDENT_AMBULATORY_CARE_PROVIDER_SITE_OTHER): Admitting: Internal Medicine

## 2024-07-17 ENCOUNTER — Ambulatory Visit: Admitting: Internal Medicine

## 2024-07-17 ENCOUNTER — Telehealth: Payer: Self-pay

## 2024-07-17 ENCOUNTER — Ambulatory Visit: Payer: Self-pay | Admitting: Cardiology

## 2024-07-17 ENCOUNTER — Ambulatory Visit (HOSPITAL_COMMUNITY)
Admission: RE | Admit: 2024-07-17 | Discharge: 2024-07-17 | Disposition: A | Discharge status: Home / Self Care | Source: Ambulatory Visit | Attending: Physician Assistant | Admitting: Physician Assistant

## 2024-07-17 VITALS — BP 132/76 | HR 54 | Resp 16 | Ht 62.0 in | Wt 158.2 lb

## 2024-07-17 VITALS — BP 150/84 | HR 70 | Ht 62.0 in | Wt 157.6 lb

## 2024-07-17 VITALS — BP 140/73 | HR 70 | Temp 98.3°F | Wt 157.0 lb

## 2024-07-17 DIAGNOSIS — I484 Atypical atrial flutter: Secondary | ICD-10-CM | POA: Diagnosis not present

## 2024-07-17 DIAGNOSIS — D6869 Other thrombophilia: Secondary | ICD-10-CM

## 2024-07-17 DIAGNOSIS — Z952 Presence of prosthetic heart valve: Secondary | ICD-10-CM

## 2024-07-17 DIAGNOSIS — I359 Nonrheumatic aortic valve disorder, unspecified: Secondary | ICD-10-CM

## 2024-07-17 DIAGNOSIS — Z951 Presence of aortocoronary bypass graft: Secondary | ICD-10-CM | POA: Diagnosis not present

## 2024-07-17 DIAGNOSIS — Z8673 Personal history of transient ischemic attack (TIA), and cerebral infarction without residual deficits: Secondary | ICD-10-CM | POA: Diagnosis not present

## 2024-07-17 DIAGNOSIS — E785 Hyperlipidemia, unspecified: Secondary | ICD-10-CM

## 2024-07-17 DIAGNOSIS — I5189 Other ill-defined heart diseases: Secondary | ICD-10-CM | POA: Diagnosis not present

## 2024-07-17 DIAGNOSIS — N179 Acute kidney failure, unspecified: Secondary | ICD-10-CM

## 2024-07-17 DIAGNOSIS — I442 Atrioventricular block, complete: Secondary | ICD-10-CM

## 2024-07-17 DIAGNOSIS — Z950 Presence of cardiac pacemaker: Secondary | ICD-10-CM

## 2024-07-17 DIAGNOSIS — Z954 Presence of other heart-valve replacement: Secondary | ICD-10-CM | POA: Diagnosis not present

## 2024-07-17 MED ORDER — AMIODARONE HCL 200 MG PO TABS: 200.0000 mg | ORAL_TABLET | Freq: Two times a day (BID) | ORAL | 3 refills | Status: DC

## 2024-07-17 NOTE — Telephone Encounter (Signed)
 Per Dr. Dennise pull picc after last dose 8/20. Community message sent to [employer] team. Lorenda CHRISTELLA Code, RMA

## 2024-07-17 NOTE — Patient Instructions (Signed)
Start amiodarone 200 mg twice a day.

## 2024-07-17 NOTE — Patient Instructions (Signed)
 Medication Instructions:  CONTINUE LASIX  40 MG DAILY  CONTINUE POTASSIUM 20 MEQ DAILY *If you need a refill on your cardiac medications before your next appointment, please call your pharmacy*  Lab Work: NO LABS If you have labs (blood work) drawn today and your tests are completely normal, you will receive your results only by: MyChart Message (if you have MyChart) OR A paper copy in the mail If you have any lab test that is abnormal or we need to change your treatment, we will call you to review the results.  Testing/Procedures: NO TESTING  Follow-Up: At [facility], you and your health needs are our priority.  As part of our continuing mission to provide you with exceptional heart care, our providers are all part of one team.  This team includes your primary Cardiologist (physician) and Advanced Practice Providers or APPs (Physician Assistants and Nurse Practitioners) who all work together to provide you with the care you need, when you need it.  Your next appointment:   3 month(s)  Provider:   Arun K Thukkani, MD

## 2024-07-17 NOTE — Progress Notes (Addendum)
 Primary Care Physician: Verena Mems, MD Primary Cardiologist: Lurena MARLA Red, MD Electrophysiologist: Soyla Gladis Norton, MD  Referring Physician: Device clinic   Kyle Matthews is a 78 y.o. male with a history of VHD s/p AVR 09/2023, CAD s/p CABG 2024, HTN, HLD, CVA, COPD, tobacco use, BPH s/p prostatectomy 05/2024, atrial fibrillation who presents for follow up in the [facility] Atrial Fibrillation [facility].  The patient underwent a redo sternotomy and Bentall procedure as well as a CABG x 1 (SVG to RCA) and tricuspid valve replacement with 27 mm Medtronic Mosaic porcine valve. This was after he presented to [facility] on 05/2024 with chest pain and lower extremity edema and rigors. 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 Had MRI of the brain as part initial workup and found to have acute left occipital lobe infarct. 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 S/p PPM (Atrial lead & CS lead) on 07/01/24. Patient is on Eliquis  for stroke prevention.    The device clinic received an alert for ongoing atrial flutter since 07/03/24.   Patient presents today for follow up for atrial fibrillation and atrial flutter. He remains in atrial flutter today. He does report fatigue but is unsure if this is from his surgery, prolonged hospitalization, or atrial flutter. No bleeding issues on anticoagulation.   Today, he denies symptoms of palpitations, chest pain, shortness of breath, orthopnea, PND, dizziness, presyncope, syncope, snoring, daytime somnolence, bleeding, or neurologic sequela. The patient is tolerating medications without difficulties and is otherwise without complaint today.    Atrial Fibrillation Risk Factors:  he does not have symptoms or diagnosis of sleep apnea. he does not have a history of rheumatic fever.   Atrial Fibrillation Management history:  Previous antiarrhythmic drugs: none Previous cardioversions: none Previous ablations: none Anticoagulation history:  Eliquis   ROS- All systems are reviewed and negative except as per the HPI above.  Past Medical History:  Diagnosis Date   Anemia    Anxiety    Arthritis    BPH (benign prostatic hyperplasia)    Status post biopsy in 2009. - w/ LUTS   Coronary artery disease    a. s/p CABG in 10/2023 with LIMA-LAD and SVG-Ramus at the time of AVR   Depression    Essential hypertension    Hyperlipidemia    On Crestor  - followed by PCP   Moderate calcific aortic stenosis 04/2017   a. s/p AVR with 23 mm Edwards Inspiris Resilia pericardial valve in 10/2023   Stroke (HCC) 2007   No true etiology noted   UTI (urinary tract infection)     Current Outpatient Medications  Medication Sig Dispense Refill   acetaminophen  (TYLENOL ) 325 MG tablet Take 1-2 tablets (325-650 mg total) by mouth every 4 (four) hours as needed for mild pain (pain score 1-3).     amiodarone  (PACERONE ) 200 MG tablet Take 1 tablet (200 mg total) by mouth 2 (two) times daily. 60 tablet 3   amLODipine  (NORVASC ) 5 MG tablet Take 1 tablet (5 mg total) by mouth daily. 30 tablet 3   apixaban  (ELIQUIS ) 5 MG TABS tablet Take 1 tablet (5 mg total) by mouth 2 (two) times daily. 60 tablet 1   aspirin  81 MG chewable tablet Chew 81 mg by mouth daily. Swallow whole.     daptomycin  (CUBICIN ) IVPB Inject 700 mg into the vein daily. Indication:  MRSE Endocarditis s/p surgery 7/9 First Dose: Yes Last Day of Therapy:  08/07/24  Labs - Once weekly:  CBC/D, BMP, and  CPK Labs - Once weekly: ESR and CRP Method of administration: IV Push Method of administration may be changed at the discretion of home infusion pharmacist based upon assessment of the patient and/or caregiver's ability to self-administer the medication ordered. 35 Units 0   docusate sodium  (COLACE) 100 MG capsule Take 1 capsule (100 mg total) by mouth 2 (two) times daily.     Ferrous Gluconate (IRON  27 PO) Take 27 mg by mouth daily.     finasteride  (PROSCAR ) 5 MG tablet Take 5 mg by mouth  every evening.     furosemide  (LASIX ) 40 MG tablet Take 1 tablet (40 mg total) by mouth daily. 7 tablet 0   nitroGLYCERIN  (NITROSTAT ) 0.4 MG SL tablet Place 1 tablet (0.4 mg total) under the tongue every 5 (five) minutes as needed for chest pain. 25 tablet 3   potassium chloride  SA (KLOR-CON  M) 20 MEQ tablet Take 1 tablet (20 mEq total) by mouth daily. X 7 days, then change to only days you take a Lasix  30 tablet 1   rosuvastatin  (CRESTOR ) 10 MG tablet Take 1 tablet (10 mg total) by mouth daily. 30 tablet 3   traMADol  (ULTRAM ) 50 MG tablet Take 1 tablet (50 mg total) by mouth every 6 (six) hours as needed for moderate pain (pain score 4-6). 30 tablet 0   No current facility-administered medications for this encounter.    Physical Exam: BP (!) 150/84   Pulse 70   Ht 5' 2 (1.575 m)   Wt 71.5 kg   SpO2 97%   BMI 28.83 kg/m   GEN: Well nourished, well developed in no acute distress NECK: No JVD CARDIAC: Regular rate and rhythm, no rubs, gallops, 4/6 systolic murmur  RESPIRATORY:  Clear to auscultation without rales, wheezing or rhonchi  ABDOMEN: Soft, non-tender, non-distended EXTREMITIES:  2+ bilateral lower extremity edema, No deformity   Wt Readings from Last 3 Encounters:  07/17/24 71.5 kg  07/17/24 71.8 kg  07/17/24 71.2 kg     EKG from this AM reviewed V pacing with underlying atrial flutter Vent. rate 70 BPM PR interval * ms QRS duration 216 ms QT/QTcB 516/557 ms   Echo 06/13/24 demonstrated   1. Left ventricular ejection fraction, by estimation, is 55 to 60%. The  left ventricle has normal function. There is mild concentric left  ventricular hypertrophy. Indeterminate diastolic filling due to E-A  fusion.   2. Right ventricular systolic function is normal. The right ventricular  size is normal.   3. Right atrial size was moderately dilated.   4. Mild mitral valve regurgitation.   5. Tricuspid valve regurgitation is moderate to severe.   6. S/p AVR (23 mm Inspiris  Resilia prosthesis; procedure date 11/02/23).  Peak and mean gradients through the valve are 46 and 28 mm Hg respectively  Acceleration time is 89 msec Ejection time is 209 msec (AT/ET is 0.39) DVI  is 0.39. Compared to echo report from December 2024, mean gradient is increased (12 to 28 mm Hg) and DVI is decreased (0.55 to 0.41) Cannot exclude obstruction to prosthesis.  Consider TEE to evaluate furtherr.. The aortic valve has been repaired/replaced. Aortic valve regurgitation is not visualized. Aortic valve sclerosis/calcification is present, without any evidence of aortic stenosis.    CHA2DS2-VASc Score = 6  The patient's score is based upon: CHF History: 0 HTN History: 1 Diabetes History: 0 Stroke History: 2 Vascular Disease History: 1 Age Score: 2 Gender Score: 0       ASSESSMENT  AND PLAN: Persistent Atrial Fibrillation/atrial flutter (ICD10:  I48.19) The patient's CHA2DS2-VASc score is 6, indicating a 9.7% annual risk of stroke.   Patient remains in atrial flutter, ongoing since 7/16 We discussed rhythm control options, also reviewed with EP.  Will start amiodarone  200 mg BID x 4 weeks, then 200 mg daily. If he does not chemically convert, will plan for DCCV.  Continue Eliquis  5 mg BID  Secondary Hypercoagulable State (ICD10:  D68.69) The patient is at significant risk for stroke/thromboembolism based upon his CHA2DS2-VASc Score of 6.  Continue Apixaban  (Eliquis ). No bleeding issues.   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  Currently on IV antibiotics  Followed by Dr Wendel  CAD S/p CABG No anginal symptoms  CHB S/p PPM, followed by Dr Inocencio     Follow up in the AF clinic in 3 weeks.     [facility] [address] [place], [place] [address] [phone]

## 2024-07-17 NOTE — Progress Notes (Signed)
 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   Acute stroke due to ischemia (HCC) 06/18/2024   Acute embolic stroke (HCC) 06/15/2024   Bacteremia 06/14/2024   SIRS (systemic inflammatory response syndrome) (HCC) 06/13/2024   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   S/P AVR (aortic valve replacement) 11/02/2023   Chronic heart failure with preserved ejection fraction (HFpEF) (HCC) 11/01/2023   Acute blood loss anemia 10/30/2023   Benign hypertension 10/30/2023   Acute heart failure with preserved ejection fraction (HFpEF) (HCC) 10/28/2023   Atherosclerosis of native coronary artery of native heart without angina pectoris 10/28/2023   Pressure injury of skin 10/28/2023   Chest pain 10/23/2023   History of essential hypertension 10/23/2023   GERD (gastroesophageal reflux disease) 10/23/2023   NSTEMI (non-ST elevated myocardial infarction) (HCC) 10/11/2023   Aortic valve disease 10/11/2023   Coronary artery disease involving native coronary artery of native heart with unstable angina pectoris (HCC) 10/10/2023   Chronic blood loss anemia 10/10/2023   Gross hematuria 10/07/2023   BPH with urinary obstruction 10/04/2023   Acute on chronic diastolic CHF (congestive heart failure) (HCC) 10/04/2023   Symptomatic anemia 10/03/2023   Kidney lesion, native, left 10/03/2023   Acute respiratory failure with hypoxia (HCC) 10/03/2023   Lesion of right native kidney 10/03/2023   Elevated brain natriuretic peptide (BNP) level 10/03/2023   Coronary artery  disease 10/03/2023   SOB (shortness of breath) 10/03/2023   Bladder outlet obstruction 10/03/2023   Chronic indwelling Foley catheter 10/03/2023   Hematuria 10/03/2023   Enlarged prostate 10/03/2023   Moderate aortic regurgitation 10/03/2023   Hyperlipidemia 05/20/2017   Nicotine dependence, cigarettes, uncomplicated 05/20/2017   Carotid bruit 05/19/2017   Essential hypertension    RBBB 04/13/2017   Severe aortic stenosis 04/13/2017     Subjective:  Kyle Matthews is a 78 year old male with past medical history as below presents for hospital follow-up of 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 - patient underwent Bentall procedure, CABG, 2 TV valve replacement. Old prosthetic valve removed and sent of Cx on 7/9. Communicated with cardiothoracic who noted all old prosthesis out.  He was discharged on daptomycin  x 6 weeks EOT 08/07/2024 from OR.  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 Today 07/17/2024: tolerating abx, no complaints. No missed doses.   Review of Systems  All other systems reviewed and are negative.   Past Medical History:  Diagnosis Date   Anemia    Anxiety    Arthritis    BPH (benign prostatic hyperplasia)    Status post biopsy in 2009. - w/ LUTS   Coronary artery disease    a. s/p CABG in 10/2023 with LIMA-LAD and SVG-Ramus at the time of AVR   Depression    Essential hypertension    Hyperlipidemia    On Crestor  - followed by PCP   Moderate calcific aortic stenosis 04/2017   a. s/p AVR with 23 mm Edwards Inspiris Resilia pericardial valve in 10/2023   Stroke [facility]) 2007   No  true etiology noted   UTI (urinary tract infection)     Outpatient Medications Prior to Visit  Medication Sig Dispense Refill   acetaminophen  (TYLENOL ) 325 MG tablet Take 1-2 tablets (325-650 mg total) by mouth every 4 (four) hours as needed for mild pain (pain score 1-3).     amLODipine  (NORVASC ) 5 MG tablet Take 1 tablet (5 mg total) by mouth daily. 30  tablet 3   apixaban  (ELIQUIS ) 5 MG TABS tablet Take 1 tablet (5 mg total) by mouth 2 (two) times daily. 60 tablet 1   aspirin  81 MG chewable tablet Chew 81 mg by mouth daily. Swallow whole.     daptomycin  (CUBICIN ) IVPB Inject 700 mg into the vein daily. 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 35 Units 0   docusate sodium  (COLACE) 100 MG capsule Take 1 capsule (100 mg total) by mouth 2 (two) times daily.     Ferrous Gluconate (IRON  27 PO) Take 27 mg by mouth daily.     finasteride  (PROSCAR ) 5 MG tablet Take 5 mg by mouth every evening.     furosemide  (LASIX ) 20 MG tablet Take 1 tablet (20 mg total) by mouth daily as needed (for fluid, edema).     furosemide  (LASIX ) 40 MG tablet Take 1 tablet (40 mg total) by mouth daily. 7 tablet 0   nitroGLYCERIN  (NITROSTAT ) 0.4 MG SL tablet Place 1 tablet (0.4 mg total) under the tongue every 5 (five) minutes as needed for chest pain. (Patient not taking: Reported on 07/09/2024) 25 tablet 3   potassium chloride  SA (KLOR-CON  M) 20 MEQ tablet Take 1 tablet (20 mEq total) by mouth daily. X 7 days, then change to only days you take a Lasix  (Patient not taking: Reported on 07/09/2024) 30 tablet 1   rosuvastatin  (CRESTOR ) 10 MG tablet Take 1 tablet (10 mg total) by mouth daily. 30 tablet 3   traMADol  (ULTRAM ) 50 MG tablet Take 1 tablet (50 mg total) by mouth every 6 (six) hours as needed for moderate pain (pain score 4-6). (Patient not taking: Reported on 07/09/2024) 30 tablet 0   No facility-administered medications prior to visit.     Allergies  Allergen Reactions   Tetanus Toxoids Other (See Comments)    Childhood  Allergy    Zetia  [Ezetimibe ] Diarrhea    Social History   Tobacco Use   Smoking status: Every Day    Current packs/day: 0.00    Average packs/day: 1.5 packs/day for 24.0 years (36.0 ttl pk-yrs)    Types: Cigarettes    Last attempt to quit: 08/21/2023    Years since quitting: 0.9   Smokeless tobacco: Never  Vaping Use   Vaping status: Never Used  Substance Use Topics   Alcohol use: Not Currently    Alcohol/week: 6.0 standard drinks of alcohol    Types: 6 Cans of beer per week   Drug use: No    Family History  Problem Relation Age of Onset   Coronary artery disease Mother 57       64. Was diagnosed with a blood clot - suspect that this was a STEMI   Lung cancer Father  Thyroid  disease Sister    Healthy Brother 44   Pancreatic cancer Sister 2   Cancer Maternal Grandmother    Heart disease Maternal Grandfather 34   Cancer Paternal Grandmother    Stroke Paternal Grandfather     Objective:  There were no vitals filed for this visit. There is no height or weight on file to calculate BMI.  Physical Exam Constitutional:      General: He is not in acute distress.    Appearance: He is normal weight. He is not toxic-appearing.  HENT:     Head: Normocephalic and atraumatic.     Right Ear: External ear normal.     Left Ear: External ear normal.     Nose: No congestion or rhinorrhea.     Mouth/Throat:     Mouth: Mucous membranes are moist.     Pharynx: Oropharynx is clear.  Eyes:     Extraocular Movements: Extraocular movements intact.     Conjunctiva/sclera: Conjunctivae normal.     Pupils: Pupils are equal, round, and reactive to light.  Cardiovascular:     Heart sounds:     No friction rub. No gallop.     Comments: Surgical scar healing Pulmonary:     Effort: Pulmonary effort is normal.     Breath sounds: Normal breath sounds.  Abdominal:     General: Abdomen is flat. Bowel sounds are normal.     Palpations: Abdomen is soft.  Musculoskeletal:        General:  No swelling. Normal range of motion.     Cervical back: Normal range of motion and neck supple.  Skin:    General: Skin is warm and dry.  Neurological:     General: No focal deficit present.     Mental Status: He is oriented to person, place, and time.  Psychiatric:        Mood and Affect: Mood normal.     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 - patient underwent Bentall procedure, CABG, 2 TV valve replacement. Old prosthetic valve removed and sent of Cx on 7/9. Communicated with cardiothoracic who noted all old prosthesis out.  He was discharged on daptomycin  x 6 weeks EOT 08/07/2024 from OR.  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 - Discharged on daptomycin  x 6 weeks from the OR EOT 8/20 #Medication management #PICC line - Labs: 7/28: scr 0.57, ck 70, wbc 7 - In 1 month okay to pull PICC after last dose of antibiotics.  Loney Stank, MD [facility]   07/17/24  6:17 AM   I have personally spent 42 minutes involved in face-to-face and non-face-to-face activities for this patient on the day of the visit. Professional time spent  includes the following activities: Preparing to see the patient (review of tests), Obtaining and/or reviewing separately obtained history (admission/discharge record), Performing a medically appropriate examination  and/or evaluation , Ordering medications/tests/procedures, referring and communicating with other health care professionals, Documenting clinical information in the EMR, Independently interpreting results (not separately reported), Communicating results to the patient/family/caregiver, Counseling and educating the patient/family/caregiver and Care coordination (not separately reported).    I have personally spent 42 minutes involved in face-to-face and non-face-to-face activities for this patient on the day of the visit. Professional time spent includes the following activities: Preparing to see the patient (review of tests), Obtaining and/or reviewing separately obtained history (admission/discharge record), Performing a medically appropriate examination and/or evaluation , Ordering medications/tests/procedures, referring and communicating with other health care professionals, Documenting clinical information in the EMR, Independently interpreting results (not separately reported), Communicating results to the patient/family/caregiver, Counseling and educating the patient/family/caregiver and Care coordination (not separately reported).

## 2024-07-17 NOTE — Addendum Note (Signed)
 Encounter addended by: Nellene Quita SAUNDERS, PA on: 07/17/2024 4:14 PM  Actions taken: Clinical Note Signed

## 2024-07-20 DIAGNOSIS — I079 Rheumatic tricuspid valve disease, unspecified: Secondary | ICD-10-CM | POA: Diagnosis not present

## 2024-07-20 DIAGNOSIS — I330 Acute and subacute infective endocarditis: Secondary | ICD-10-CM | POA: Diagnosis not present

## 2024-07-20 DIAGNOSIS — T826XXA Infection and inflammatory reaction due to cardiac valve prosthesis, initial encounter: Secondary | ICD-10-CM | POA: Diagnosis not present

## 2024-07-22 DIAGNOSIS — I5032 Chronic diastolic (congestive) heart failure: Secondary | ICD-10-CM | POA: Diagnosis not present

## 2024-07-22 DIAGNOSIS — Z952 Presence of prosthetic heart valve: Secondary | ICD-10-CM | POA: Diagnosis not present

## 2024-07-22 DIAGNOSIS — I110 Hypertensive heart disease with heart failure: Secondary | ICD-10-CM | POA: Diagnosis not present

## 2024-07-22 DIAGNOSIS — Z950 Presence of cardiac pacemaker: Secondary | ICD-10-CM | POA: Diagnosis not present

## 2024-07-22 DIAGNOSIS — Z792 Long term (current) use of antibiotics: Secondary | ICD-10-CM | POA: Diagnosis not present

## 2024-07-22 DIAGNOSIS — N138 Other obstructive and reflux uropathy: Secondary | ICD-10-CM | POA: Diagnosis not present

## 2024-07-22 DIAGNOSIS — Z48812 Encounter for surgical aftercare following surgery on the circulatory system: Secondary | ICD-10-CM | POA: Diagnosis not present

## 2024-07-22 DIAGNOSIS — N179 Acute kidney failure, unspecified: Secondary | ICD-10-CM | POA: Diagnosis not present

## 2024-07-22 DIAGNOSIS — Z7982 Long term (current) use of aspirin: Secondary | ICD-10-CM | POA: Diagnosis not present

## 2024-07-22 DIAGNOSIS — Z951 Presence of aortocoronary bypass graft: Secondary | ICD-10-CM | POA: Diagnosis not present

## 2024-07-22 DIAGNOSIS — Z452 Encounter for adjustment and management of vascular access device: Secondary | ICD-10-CM | POA: Diagnosis not present

## 2024-07-22 DIAGNOSIS — Z8673 Personal history of transient ischemic attack (TIA), and cerebral infarction without residual deficits: Secondary | ICD-10-CM | POA: Diagnosis not present

## 2024-07-22 DIAGNOSIS — I2510 Atherosclerotic heart disease of native coronary artery without angina pectoris: Secondary | ICD-10-CM | POA: Diagnosis not present

## 2024-07-22 DIAGNOSIS — F17210 Nicotine dependence, cigarettes, uncomplicated: Secondary | ICD-10-CM | POA: Diagnosis not present

## 2024-07-22 DIAGNOSIS — D649 Anemia, unspecified: Secondary | ICD-10-CM | POA: Diagnosis not present

## 2024-07-25 ENCOUNTER — Telehealth: Payer: Self-pay | Admitting: Internal Medicine

## 2024-07-25 DIAGNOSIS — I079 Rheumatic tricuspid valve disease, unspecified: Secondary | ICD-10-CM | POA: Diagnosis not present

## 2024-07-25 DIAGNOSIS — T826XXA Infection and inflammatory reaction due to cardiac valve prosthesis, initial encounter: Secondary | ICD-10-CM | POA: Diagnosis not present

## 2024-07-25 DIAGNOSIS — I330 Acute and subacute infective endocarditis: Secondary | ICD-10-CM | POA: Diagnosis not present

## 2024-07-25 NOTE — Telephone Encounter (Signed)
 Spoke with pt regarding his swelling. Pt stated he has been having swelling in both his lower extremities for some time now but is as bad as it has ever been today. Pt stated he also has a rash on the tops of his feet. Pt stated that the rash is not raised and looks like a red speckling. The pt first noticed the rash this morning and stated it is now almost gone. Pt has not gained weight from his swelling but has lost weight. He went from 154.2 on 8/4 to 152.2 on 8/7. Pt is taking 40 mg of Lasix  daily. Pt stated he has been elevating his legs at night and has put on compression stockings today. Pt is unable to put his shoes on. Pt stated he has been eating salty and processed foods. Pt took his blood pressure while on the phone. The first reading was 168/78. Pt was talking while he took it. When he took his bp again it was 146/78. Pt was advised to continue lasix  and wearing his compression stockings and elevating his legs. Pt was advised to avoid salty and processed foods. Pt was told the information provided would be sent to his provider for suggestions. Pt was told to report to the ED if he begins to have shortness of breath or chest pain. Pt verbalized understanding. All questions if any were answered.

## 2024-07-25 NOTE — Telephone Encounter (Signed)
 Pt c/o swelling/edema: STAT if pt has developed SOB within 24 hours  If swelling, where is the swelling located?   Both feet  How much weight have you gained and in what time span?   No  Have you gained 2 pounds in a day or 5 pounds in a week?   No  Do you have a log of your daily weights (if so, list)?  Monday, 8/4 - 154.2 pounds  Are you currently taking a fluid pill?   Yes  Are you currently SOB?   No  Have you traveled recently in a car or plane for an extended period of time?  No  Patient is concerned the swelling in his feet from last week has not gone done and has developed a rash at the top of his feet.

## 2024-07-25 NOTE — Telephone Encounter (Signed)
 I think the most important thing is avoiding salty foods. Continue all meds, avoid salty foods, let us  know how things go on Monday. ER precautions as you provided. Thanks.   Gave him the information above, he verbalized understanding.

## 2024-07-26 LAB — FUNGAL ORGANISM REFLEX

## 2024-07-26 LAB — FUNGUS CULTURE RESULT

## 2024-07-26 LAB — FUNGUS CULTURE WITH STAIN

## 2024-07-29 DIAGNOSIS — N179 Acute kidney failure, unspecified: Secondary | ICD-10-CM | POA: Diagnosis not present

## 2024-07-29 DIAGNOSIS — Z7982 Long term (current) use of aspirin: Secondary | ICD-10-CM | POA: Diagnosis not present

## 2024-07-29 DIAGNOSIS — Z8673 Personal history of transient ischemic attack (TIA), and cerebral infarction without residual deficits: Secondary | ICD-10-CM | POA: Diagnosis not present

## 2024-07-29 DIAGNOSIS — Z452 Encounter for adjustment and management of vascular access device: Secondary | ICD-10-CM | POA: Diagnosis not present

## 2024-07-29 DIAGNOSIS — D649 Anemia, unspecified: Secondary | ICD-10-CM | POA: Diagnosis not present

## 2024-07-29 DIAGNOSIS — Z951 Presence of aortocoronary bypass graft: Secondary | ICD-10-CM | POA: Diagnosis not present

## 2024-07-29 DIAGNOSIS — N138 Other obstructive and reflux uropathy: Secondary | ICD-10-CM | POA: Diagnosis not present

## 2024-07-29 DIAGNOSIS — I2510 Atherosclerotic heart disease of native coronary artery without angina pectoris: Secondary | ICD-10-CM | POA: Diagnosis not present

## 2024-07-29 DIAGNOSIS — Z792 Long term (current) use of antibiotics: Secondary | ICD-10-CM | POA: Diagnosis not present

## 2024-07-29 DIAGNOSIS — I5032 Chronic diastolic (congestive) heart failure: Secondary | ICD-10-CM | POA: Diagnosis not present

## 2024-07-29 DIAGNOSIS — Z952 Presence of prosthetic heart valve: Secondary | ICD-10-CM | POA: Diagnosis not present

## 2024-07-29 DIAGNOSIS — I5030 Unspecified diastolic (congestive) heart failure: Secondary | ICD-10-CM | POA: Diagnosis not present

## 2024-07-29 DIAGNOSIS — I110 Hypertensive heart disease with heart failure: Secondary | ICD-10-CM | POA: Diagnosis not present

## 2024-07-29 DIAGNOSIS — F17210 Nicotine dependence, cigarettes, uncomplicated: Secondary | ICD-10-CM | POA: Diagnosis not present

## 2024-07-29 DIAGNOSIS — Z48812 Encounter for surgical aftercare following surgery on the circulatory system: Secondary | ICD-10-CM | POA: Diagnosis not present

## 2024-07-29 DIAGNOSIS — Z950 Presence of cardiac pacemaker: Secondary | ICD-10-CM | POA: Diagnosis not present

## 2024-08-01 ENCOUNTER — Ambulatory Visit: Payer: Self-pay | Admitting: Internal Medicine

## 2024-08-01 ENCOUNTER — Ambulatory Visit (HOSPITAL_COMMUNITY)
Admission: RE | Admit: 2024-08-01 | Discharge: 2024-08-01 | Disposition: A | Discharge status: Home / Self Care | Source: Ambulatory Visit | Attending: Internal Medicine | Admitting: Internal Medicine

## 2024-08-01 DIAGNOSIS — T826XXA Infection and inflammatory reaction due to cardiac valve prosthesis, initial encounter: Secondary | ICD-10-CM | POA: Diagnosis not present

## 2024-08-01 DIAGNOSIS — I330 Acute and subacute infective endocarditis: Secondary | ICD-10-CM | POA: Diagnosis not present

## 2024-08-01 DIAGNOSIS — I368 Other nonrheumatic tricuspid valve disorders: Secondary | ICD-10-CM | POA: Diagnosis not present

## 2024-08-01 DIAGNOSIS — Z954 Presence of other heart-valve replacement: Secondary | ICD-10-CM | POA: Diagnosis not present

## 2024-08-01 DIAGNOSIS — Z952 Presence of prosthetic heart valve: Secondary | ICD-10-CM | POA: Insufficient documentation

## 2024-08-01 DIAGNOSIS — I358 Other nonrheumatic aortic valve disorders: Secondary | ICD-10-CM

## 2024-08-01 DIAGNOSIS — I079 Rheumatic tricuspid valve disease, unspecified: Secondary | ICD-10-CM | POA: Diagnosis not present

## 2024-08-01 LAB — ECHOCARDIOGRAM COMPLETE
AR max vel: 4.17 cm2
AV Area VTI: 3.87 cm2
AV Area mean vel: 4.03 cm2
AV Mean grad: 7.7 mmHg
AV Peak grad: 14.7 mmHg
Ao pk vel: 1.92 m/s
Area-P 1/2: 3.71 cm2
Calc EF: 57.6 %
MV M vel: 5.22 m/s
MV Peak grad: 109 mmHg
MV VTI: 3.99 cm2
Radius: 0.5 cm
S' Lateral: 3.8 cm
Single Plane A2C EF: 50.5 %
Single Plane A4C EF: 63.1 %

## 2024-08-01 NOTE — Progress Notes (Signed)
  Echocardiogram 2D Echocardiogram has been performed.  Kyle Matthews 08/01/2024, 10:29 AM

## 2024-08-05 ENCOUNTER — Other Ambulatory Visit: Payer: Self-pay | Admitting: Surgery

## 2024-08-05 DIAGNOSIS — F17210 Nicotine dependence, cigarettes, uncomplicated: Secondary | ICD-10-CM | POA: Diagnosis not present

## 2024-08-05 DIAGNOSIS — Z792 Long term (current) use of antibiotics: Secondary | ICD-10-CM | POA: Diagnosis not present

## 2024-08-05 DIAGNOSIS — Z7982 Long term (current) use of aspirin: Secondary | ICD-10-CM | POA: Diagnosis not present

## 2024-08-05 DIAGNOSIS — N138 Other obstructive and reflux uropathy: Secondary | ICD-10-CM | POA: Diagnosis not present

## 2024-08-05 DIAGNOSIS — D649 Anemia, unspecified: Secondary | ICD-10-CM | POA: Diagnosis not present

## 2024-08-05 DIAGNOSIS — Z951 Presence of aortocoronary bypass graft: Secondary | ICD-10-CM

## 2024-08-05 DIAGNOSIS — Z8673 Personal history of transient ischemic attack (TIA), and cerebral infarction without residual deficits: Secondary | ICD-10-CM | POA: Diagnosis not present

## 2024-08-05 DIAGNOSIS — Z48812 Encounter for surgical aftercare following surgery on the circulatory system: Secondary | ICD-10-CM | POA: Diagnosis not present

## 2024-08-05 DIAGNOSIS — Z452 Encounter for adjustment and management of vascular access device: Secondary | ICD-10-CM | POA: Diagnosis not present

## 2024-08-05 DIAGNOSIS — Z952 Presence of prosthetic heart valve: Secondary | ICD-10-CM | POA: Diagnosis not present

## 2024-08-05 DIAGNOSIS — Z950 Presence of cardiac pacemaker: Secondary | ICD-10-CM | POA: Diagnosis not present

## 2024-08-05 DIAGNOSIS — N179 Acute kidney failure, unspecified: Secondary | ICD-10-CM | POA: Diagnosis not present

## 2024-08-05 DIAGNOSIS — I5032 Chronic diastolic (congestive) heart failure: Secondary | ICD-10-CM | POA: Diagnosis not present

## 2024-08-05 DIAGNOSIS — I2510 Atherosclerotic heart disease of native coronary artery without angina pectoris: Secondary | ICD-10-CM | POA: Diagnosis not present

## 2024-08-05 DIAGNOSIS — I110 Hypertensive heart disease with heart failure: Secondary | ICD-10-CM | POA: Diagnosis not present

## 2024-08-07 ENCOUNTER — Other Ambulatory Visit: Payer: Self-pay

## 2024-08-07 ENCOUNTER — Ambulatory Visit (HOSPITAL_COMMUNITY)
Admission: RE | Admit: 2024-08-07 | Discharge: 2024-08-07 | Disposition: A | Discharge status: Home / Self Care | Source: Ambulatory Visit | Attending: Cardiovascular Disease | Admitting: Cardiovascular Disease

## 2024-08-07 ENCOUNTER — Ambulatory Visit: Admitting: Internal Medicine

## 2024-08-07 ENCOUNTER — Encounter: Payer: Self-pay | Admitting: Internal Medicine

## 2024-08-07 ENCOUNTER — Ambulatory Visit

## 2024-08-07 ENCOUNTER — Ambulatory Visit: Admitting: Surgery

## 2024-08-07 VITALS — BP 120/62 | HR 71 | Resp 20 | Ht 62.0 in | Wt 155.0 lb

## 2024-08-07 VITALS — BP 132/73 | HR 70 | Temp 98.1°F | Wt 153.0 lb

## 2024-08-07 DIAGNOSIS — I359 Nonrheumatic aortic valve disorder, unspecified: Secondary | ICD-10-CM

## 2024-08-07 DIAGNOSIS — Z09 Encounter for follow-up examination after completed treatment for conditions other than malignant neoplasm: Secondary | ICD-10-CM

## 2024-08-07 DIAGNOSIS — Z951 Presence of aortocoronary bypass graft: Secondary | ICD-10-CM

## 2024-08-07 NOTE — Progress Notes (Signed)
 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   Acute stroke due to ischemia (HCC) 06/18/2024   Acute embolic stroke (HCC) 06/15/2024   Bacteremia 06/14/2024   SIRS (systemic inflammatory response syndrome) (HCC) 06/13/2024   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   S/P AVR (aortic valve replacement) 11/02/2023   Chronic heart failure with preserved ejection fraction (HFpEF) (HCC) 11/01/2023   Acute blood loss anemia 10/30/2023   Benign hypertension 10/30/2023   Acute heart failure with preserved ejection fraction (HFpEF) (HCC) 10/28/2023   Atherosclerosis of native coronary artery of native heart without angina pectoris 10/28/2023   Pressure injury of skin 10/28/2023   Chest pain 10/23/2023   History of essential hypertension 10/23/2023   GERD (gastroesophageal reflux disease) 10/23/2023   NSTEMI (non-ST elevated myocardial infarction) (HCC) 10/11/2023   Aortic valve disease 10/11/2023   Coronary artery disease involving native coronary artery of native heart with unstable angina pectoris (HCC) 10/10/2023   Chronic blood loss anemia 10/10/2023   Gross hematuria 10/07/2023   BPH with urinary obstruction 10/04/2023   Acute on chronic diastolic CHF (congestive heart failure) (HCC) 10/04/2023   Symptomatic anemia 10/03/2023   Kidney lesion, native, left 10/03/2023   Acute respiratory failure with hypoxia (HCC) 10/03/2023   Lesion of right native kidney 10/03/2023   Elevated brain natriuretic peptide (BNP) level 10/03/2023   Coronary artery disease  10/03/2023   SOB (shortness of breath) 10/03/2023   Bladder outlet obstruction 10/03/2023   Chronic indwelling Foley catheter 10/03/2023   Hematuria 10/03/2023   Enlarged prostate 10/03/2023   Moderate aortic regurgitation 10/03/2023   Hyperlipidemia 05/20/2017   Nicotine dependence, cigarettes, uncomplicated 05/20/2017   Carotid bruit 05/19/2017   Essential hypertension    RBBB 04/13/2017   Severe aortic stenosis 04/13/2017     Subjective:  Kyle Matthews is a 78 y.o. 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 - patient underwent Bentall procedure, CABG, 2 TV valve replacement. Old prosthetic valve removed and sent of Cx on 7/9. Communicated with cardiothoracic who noted all old prosthesis out.  He was discharged on daptomycin  x 6 weeks EOT 08/07/2024 from OR.  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 07/17/2024: tolerating abx, no complaints. No missed doses.  Today : Doing well no new complaints ROS  Past Medical History:  Diagnosis Date   Anemia    Anxiety    Arthritis    BPH (benign prostatic hyperplasia)    Status post biopsy in 2009. - w/ LUTS   Coronary artery disease    a. s/p CABG in 10/2023 with LIMA-LAD and SVG-Ramus at the time of AVR   Depression    Essential hypertension    Hyperlipidemia    On Crestor  - followed by PCP   Moderate calcific aortic stenosis 04/2017   a. s/p AVR with 23 mm Edwards Inspiris Resilia pericardial valve in 10/2023   Stroke [facility]) 2007   No true etiology noted   UTI (urinary tract  infection)     Outpatient Medications Prior to Visit  Medication Sig Dispense Refill   acetaminophen  (TYLENOL ) 325 MG tablet Take 1-2 tablets (325-650 mg total) by mouth every 4 (four) hours as needed for mild pain (pain score 1-3).     amiodarone  (PACERONE ) 200 MG tablet Take 1 tablet (200 mg total) by mouth 2 (two) times daily. 60 tablet 3   amLODipine   (NORVASC ) 5 MG tablet Take 1 tablet (5 mg total) by mouth daily. 30 tablet 3   apixaban  (ELIQUIS ) 5 MG TABS tablet Take 1 tablet (5 mg total) by mouth 2 (two) times daily. 60 tablet 1   aspirin  81 MG chewable tablet Chew 81 mg by mouth daily. Swallow whole.     daptomycin  (CUBICIN ) IVPB Inject 700 mg into the vein daily. 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 35 Units 0   docusate sodium  (COLACE) 100 MG capsule Take 1 capsule (100 mg total) by mouth 2 (two) times daily.     Ferrous Gluconate (IRON  27 PO) Take 27 mg by mouth daily.     finasteride  (PROSCAR ) 5 MG tablet Take 5 mg by mouth every evening.     furosemide  (LASIX ) 40 MG tablet Take 1 tablet (40 mg total) by mouth daily. 7 tablet 0   nitroGLYCERIN  (NITROSTAT ) 0.4 MG SL tablet Place 1 tablet (0.4 mg total) under the tongue every 5 (five) minutes as needed for chest pain. 25 tablet 3   potassium chloride  SA (KLOR-CON  M) 20 MEQ tablet Take 1 tablet (20 mEq total) by mouth daily. X 7 days, then change to only days you take a Lasix  30 tablet 1   rosuvastatin  (CRESTOR ) 10 MG tablet Take 1 tablet (10 mg total) by mouth daily. 30 tablet 3   traMADol  (ULTRAM ) 50 MG tablet Take 1 tablet (50 mg total) by mouth every 6 (six) hours as needed for moderate pain (pain score 4-6). 30 tablet 0   No facility-administered medications prior to visit.     Allergies  Allergen Reactions   Tetanus Toxoids Other (See Comments)    Childhood Allergy    Zetia  [Ezetimibe ] Diarrhea    Social History   Tobacco Use   Smoking status: Former    Current packs/day: 0.00    Average packs/day: 1.5 packs/day for  24.0 years (36.0 ttl pk-yrs)    Types: Cigarettes    Quit date: 08/21/2023    Years since quitting: 0.9   Smokeless tobacco: Former   Tobacco comments:    Former smoker 07/17/24  Vaping Use   Vaping status: Never Used  Substance Use Topics   Alcohol use: Not Currently    Alcohol/week: 6.0 standard drinks of alcohol    Types: 6 Cans of beer per week   Drug use: No    Family History  Problem Relation Age of Onset   Coronary artery disease Mother 15       65. Was diagnosed with a blood clot - suspect that this was a STEMI   Lung cancer Father    Thyroid  disease Sister    Healthy Brother 10   Pancreatic cancer Sister 19   Cancer Maternal Grandmother  Heart disease Maternal Grandfather 30   Cancer Paternal Grandmother    Stroke Paternal Grandfather     Objective:  There were no vitals filed for this visit. There is no height or weight on file to calculate BMI.  Physical Exam  Lab Results: Lab Results  Component Value Date   WBC 10.1 07/04/2024   HGB 8.4 (L) 07/04/2024   HCT 26.4 (L) 07/04/2024   MCV 93.3 07/04/2024   PLT 182 07/04/2024    Lab Results  Component Value Date   CREATININE 0.74 07/04/2024   BUN 24 (H) 07/04/2024   NA 137 07/04/2024   K 4.3 07/04/2024   CL 104 07/04/2024   CO2 25 07/04/2024    Lab Results  Component Value Date   ALT 12 06/12/2024   AST 19 06/12/2024   ALKPHOS 64 06/12/2024   BILITOT 0.8 06/12/2024     Assessment & Plan:  78 year old male presents with  for hospital follow-up of prosthetic aortic valve valve endocarditis/abscess, 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 Old prosthetic valve removed and sent of Cx on 7/9. Communicated with cardiothoracic who noted all old prosthesis out.  He was discharged on daptomycin  x 6  weeks EOT 08/07/2024 from OR.  - Discharged on daptomycin  x 6 weeks from the OR EOT 8/20.  -EP noted on 7/30 that if pt does not chemically convert then plan on dccv -TTE on 8/14 without vegetation noted on AoV/TV Plan: Take out picc after last dose of abx Follow up in one month to do labs off of abx  #Medication management #PICC line - Labs: 8/8 SCr 0.63, WBC 5.5, CK 57   Loney Stank, MD [facility]   08/07/24  12:48 PM I have personally spent 45 minutes involved in face-to-face and non-face-to-face activities for this patient on the day of the visit. Professional time spent includes the following activities: Preparing to see the patient (review of tests), Obtaining and/or reviewing separately obtained history (admission/discharge record), Performing a medically appropriate examination and/or evaluation , Ordering medications/tests/procedures, referring and communicating with other health care professionals, Documenting clinical information in the EMR, Independently interpreting results (not separately reported), Communicating results to the patient/family/caregiver, Counseling and educating the patient/family/caregiver and Care coordination (not separately reported).

## 2024-08-07 NOTE — Patient Instructions (Signed)
-   Follow-up in 2 months with cardiothoracic surgery - Continue follow-up with cardiologist - Continue follow-up with infectious disease -Continue follow-up with atrial fibrillation clinic

## 2024-08-07 NOTE — Progress Notes (Addendum)
 [address] [name] [address]             [phone]       HPI: 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 Lucas.    The patient's early postoperative recovery while in the hospital was notable for being treated with IV vancomycin  for methicillin resistant staph epidermidis. He had expected complete heart block after surgery due to the extensive bradycardia required for both the aortic and tricuspid valve replacements. He was paced with epicardial pacing leads.  Electrophysiology service was consulted and he required permeant placement of a pacemaker. He developed an acute kidney injury after surgery and nephrology was consulted. He was given IV lasix  and slowly urine output improved. He was followed by advanced heart failure team and infectious disease.  Vancomycin  was switched to daptomycin  per ID recommendations.  He had hypotension which required midodrine  and dose was tapered and then discontinued prior to discharge.  He had PICC line placement due to needing home IV antibiotic therapy. Since creatinine levels improved he was started on lasix  and potassium for diuresis.  He was medical stable for discharge on 07/04/2024.   Since hospital discharge  the patient reports that he has been doing well.  After discharge he was having some fatigue but after seeing atrial fibrillation clinic where his medication was changed to amiodarone  for atrial flutter he reports that he is feeling much better.  He received his last infusion of antibiotic yesterday and his PICC line will be removed tomorrow by home health.  He reports that his pain is well-controlled without the use of medications.  He does have some lower leg swelling which cardiology increased his furosemide  and he is attempting to eat a low-sodium diet.  He denies chest pain and shortness of breath..   Current Outpatient Medications  Medication Sig Dispense Refill   acetaminophen  (TYLENOL ) 325 MG tablet Take 1-2 tablets (325-650 mg total) by mouth every 4 (four) hours as needed for mild pain (pain score 1-3).     amiodarone  (PACERONE ) 200 MG tablet Take 1 tablet (200 mg total) by mouth 2 (two) times daily. 60 tablet 3   amLODipine  (NORVASC ) 5 MG tablet Take 1 tablet (5 mg total) by mouth daily. 30 tablet 3   apixaban  (ELIQUIS ) 5 MG TABS tablet Take 1 tablet (5 mg total) by mouth 2 (two) times daily. 60 tablet 1   aspirin  81 MG chewable tablet Chew 81 mg by mouth daily. Swallow whole.     daptomycin  (CUBICIN ) IVPB Inject 700 mg into the vein daily. Indication:  MRSE Endocarditis s/p surgery 7/9 First Dose: Yes Last Day of Therapy:  08/07/24  Labs - Once weekly:  CBC/D, BMP, and CPK Labs - Once weekly: ESR and CRP Method of administration: IV Push Method  of administration may be changed at the discretion of home infusion pharmacist based upon assessment of the patient and/or caregiver's ability to self-administer the medication ordered. 35 Units 0   docusate sodium  (COLACE) 100 MG capsule Take 1 capsule (100 mg total) by mouth 2 (two) times daily.     Ferrous Gluconate (IRON  27 PO) Take 27 mg by mouth daily.     finasteride  (PROSCAR ) 5 MG tablet Take 5 mg by mouth every evening.     furosemide   (LASIX ) 40 MG tablet Take 1 tablet (40 mg total) by mouth daily. 7 tablet 0   nitroGLYCERIN  (NITROSTAT ) 0.4 MG SL tablet Place 1 tablet (0.4 mg total) under the tongue every 5 (five) minutes as needed for chest pain. 25 tablet 3   potassium chloride  SA (KLOR-CON  M) 20 MEQ tablet Take 1 tablet (20 mEq total) by mouth daily. X 7 days, then change to only days you take a Lasix  30 tablet 1   rosuvastatin  (CRESTOR ) 10 MG tablet Take 1 tablet (10 mg total) by mouth daily. 30 tablet 3   traMADol  (ULTRAM ) 50 MG tablet Take 1 tablet (50 mg total) by mouth every 6 (six) hours as needed for moderate pain (pain score 4-6). 30 tablet 0   No current facility-administered medications for this visit.   Vitals:   08/07/24 1556  BP: 120/62  Pulse: 71  Resp: 20  SpO2: 95%   Review of Systems  Constitutional: Negative.  Negative for fever and malaise/fatigue.  Respiratory:  Negative for cough, shortness of breath and wheezing.   Cardiovascular:  Positive for leg swelling.     Physical Exam Constitutional:      Appearance: Normal appearance.  HENT:     Head: Normocephalic and atraumatic.  Cardiovascular:     Rate and Rhythm: Normal rate and regular rhythm.     Heart sounds: Murmur heard.     Comments: systolic murmur consistent with the bioprosthetic aortic valve Skin:    General: Skin is warm and dry.      Neurological:     General: No focal deficit present.     Mental Status: He is alert and oriented to person, place, and time.      Diagnostic Tests: I personally reviewed the chest x-ray images.   Plan: -We reviewed today's chest x ray.  We discussed driving and is able to do so at this time since he is no longer taking narcotics for pain.  First time driving should be a short distance in the daytime and he can slowly increase every day after that.  We discussed participation in cardiac rehab and he declines participation at this time.  He is to continue sternal precautions until a full 6  weeks from his surgical procedure.  He can slowly increase activity and exercise as tolerated. -He has been seen by cardiology and he is to continue lasix  40 mg and potassium.  He was having some peripheral edema and lasix  will be increased if no improvement.  He is to continue low-sodium diet and also try wearing compression stockings to help with peripheral edema.  He is to continue eliquis , aspirin , rosuvastatin  and amlodipine .  -He was seen by atrial fibrillation clinic and was started on amiodarone  due to having atrial flutter since 07/03/2024.   -He has been seen by infectious disease and has been continuing home daptomycin  treatments.  He is to complete 6 weeks worth of antibiotics.  Last infusion was this week of antibiotics.  His PICC line  is going to be removed in the next couple days.  -Follow-up visit with Dr. Sherrine in 2 months.   Manuelita CHRISTELLA Rough, PA-C [facility] [phone]

## 2024-08-08 ENCOUNTER — Other Ambulatory Visit (HOSPITAL_COMMUNITY)

## 2024-08-08 DIAGNOSIS — Z950 Presence of cardiac pacemaker: Secondary | ICD-10-CM | POA: Diagnosis not present

## 2024-08-08 DIAGNOSIS — Z8673 Personal history of transient ischemic attack (TIA), and cerebral infarction without residual deficits: Secondary | ICD-10-CM | POA: Diagnosis not present

## 2024-08-08 DIAGNOSIS — Z452 Encounter for adjustment and management of vascular access device: Secondary | ICD-10-CM | POA: Diagnosis not present

## 2024-08-08 DIAGNOSIS — N138 Other obstructive and reflux uropathy: Secondary | ICD-10-CM | POA: Diagnosis not present

## 2024-08-08 DIAGNOSIS — Z951 Presence of aortocoronary bypass graft: Secondary | ICD-10-CM | POA: Diagnosis not present

## 2024-08-08 DIAGNOSIS — D649 Anemia, unspecified: Secondary | ICD-10-CM | POA: Diagnosis not present

## 2024-08-08 DIAGNOSIS — Z48812 Encounter for surgical aftercare following surgery on the circulatory system: Secondary | ICD-10-CM | POA: Diagnosis not present

## 2024-08-08 DIAGNOSIS — Z7982 Long term (current) use of aspirin: Secondary | ICD-10-CM | POA: Diagnosis not present

## 2024-08-08 DIAGNOSIS — Z792 Long term (current) use of antibiotics: Secondary | ICD-10-CM | POA: Diagnosis not present

## 2024-08-08 DIAGNOSIS — F17210 Nicotine dependence, cigarettes, uncomplicated: Secondary | ICD-10-CM | POA: Diagnosis not present

## 2024-08-08 DIAGNOSIS — N179 Acute kidney failure, unspecified: Secondary | ICD-10-CM | POA: Diagnosis not present

## 2024-08-08 DIAGNOSIS — I110 Hypertensive heart disease with heart failure: Secondary | ICD-10-CM | POA: Diagnosis not present

## 2024-08-08 DIAGNOSIS — I5032 Chronic diastolic (congestive) heart failure: Secondary | ICD-10-CM | POA: Diagnosis not present

## 2024-08-08 DIAGNOSIS — I2510 Atherosclerotic heart disease of native coronary artery without angina pectoris: Secondary | ICD-10-CM | POA: Diagnosis not present

## 2024-08-08 DIAGNOSIS — Z952 Presence of prosthetic heart valve: Secondary | ICD-10-CM | POA: Diagnosis not present

## 2024-08-09 LAB — ACID FAST CULTURE WITH REFLEXED SENSITIVITIES (MYCOBACTERIA)
Acid Fast Culture: NEGATIVE
Acid Fast Culture: NEGATIVE
Acid Fast Culture: NEGATIVE

## 2024-08-12 DIAGNOSIS — F17210 Nicotine dependence, cigarettes, uncomplicated: Secondary | ICD-10-CM | POA: Diagnosis not present

## 2024-08-12 DIAGNOSIS — N138 Other obstructive and reflux uropathy: Secondary | ICD-10-CM | POA: Diagnosis not present

## 2024-08-12 DIAGNOSIS — Z950 Presence of cardiac pacemaker: Secondary | ICD-10-CM | POA: Diagnosis not present

## 2024-08-12 DIAGNOSIS — I110 Hypertensive heart disease with heart failure: Secondary | ICD-10-CM | POA: Diagnosis not present

## 2024-08-12 DIAGNOSIS — N179 Acute kidney failure, unspecified: Secondary | ICD-10-CM | POA: Diagnosis not present

## 2024-08-12 DIAGNOSIS — Z7982 Long term (current) use of aspirin: Secondary | ICD-10-CM | POA: Diagnosis not present

## 2024-08-12 DIAGNOSIS — I5032 Chronic diastolic (congestive) heart failure: Secondary | ICD-10-CM | POA: Diagnosis not present

## 2024-08-12 DIAGNOSIS — Z452 Encounter for adjustment and management of vascular access device: Secondary | ICD-10-CM | POA: Diagnosis not present

## 2024-08-12 DIAGNOSIS — D649 Anemia, unspecified: Secondary | ICD-10-CM | POA: Diagnosis not present

## 2024-08-12 DIAGNOSIS — Z48812 Encounter for surgical aftercare following surgery on the circulatory system: Secondary | ICD-10-CM | POA: Diagnosis not present

## 2024-08-12 DIAGNOSIS — Z792 Long term (current) use of antibiotics: Secondary | ICD-10-CM | POA: Diagnosis not present

## 2024-08-12 DIAGNOSIS — I2510 Atherosclerotic heart disease of native coronary artery without angina pectoris: Secondary | ICD-10-CM | POA: Diagnosis not present

## 2024-08-12 DIAGNOSIS — Z8673 Personal history of transient ischemic attack (TIA), and cerebral infarction without residual deficits: Secondary | ICD-10-CM | POA: Diagnosis not present

## 2024-08-12 DIAGNOSIS — Z952 Presence of prosthetic heart valve: Secondary | ICD-10-CM | POA: Diagnosis not present

## 2024-08-12 DIAGNOSIS — Z951 Presence of aortocoronary bypass graft: Secondary | ICD-10-CM | POA: Diagnosis not present

## 2024-08-13 ENCOUNTER — Ambulatory Visit (HOSPITAL_COMMUNITY)
Admission: RE | Admit: 2024-08-13 | Discharge: 2024-08-13 | Disposition: A | Discharge status: Home / Self Care | Source: Ambulatory Visit | Attending: Physician Assistant | Admitting: Physician Assistant

## 2024-08-13 VITALS — BP 116/70 | HR 70 | Ht 62.0 in | Wt 155.0 lb

## 2024-08-13 DIAGNOSIS — I484 Atypical atrial flutter: Secondary | ICD-10-CM

## 2024-08-13 DIAGNOSIS — I4891 Unspecified atrial fibrillation: Secondary | ICD-10-CM

## 2024-08-13 DIAGNOSIS — Z79899 Other long term (current) drug therapy: Secondary | ICD-10-CM

## 2024-08-13 DIAGNOSIS — Z5181 Encounter for therapeutic drug level monitoring: Secondary | ICD-10-CM

## 2024-08-13 DIAGNOSIS — D6869 Other thrombophilia: Secondary | ICD-10-CM | POA: Diagnosis not present

## 2024-08-13 MED ORDER — AMIODARONE HCL 200 MG PO TABS: 200.0000 mg | ORAL_TABLET | Freq: Every day | ORAL | 1 refills | Status: AC

## 2024-08-13 NOTE — Progress Notes (Signed)
 Primary Care Physician: Verena Mems, MD Primary Cardiologist: Lurena MARLA Red, MD Electrophysiologist: Soyla Gladis Norton, MD  Referring Physician: Device clinic   Kyle Matthews is a 78 y.o. 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.  The patient underwent a redo sternotomy and Bentall procedure as well as a CABG x 1 (SVG to RCA) and tricuspid valve replacement with 27 mm Medtronic Mosaic porcine valve. This was after he presented to [facility] on 05/2024 with chest pain and lower extremity edema and rigors. 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 Had MRI of the brain as part initial workup and found to have acute left occipital lobe infarct. 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 S/p PPM (Atrial lead & CS lead) on 07/01/24. Patient is on Eliquis  for stroke prevention.    The device clinic received an alert for ongoing atrial flutter since 07/03/24.   Patient returns for follow up for atrial fibrillation and amiodarone  monitoring. He remains in atrial flutter today. He does report that his fatigue and SOB have improved since his last visit. No bleeding issues on anticoagulation.   Today, he  denies symptoms of palpitations, chest pain, shortness of breath, orthopnea, PND, lower extremity edema, dizziness, presyncope, syncope, snoring, daytime somnolence, bleeding, or neurologic sequela. The patient is tolerating medications without difficulties and is otherwise without complaint today.    Atrial Fibrillation Risk Factors:  he does not have symptoms or diagnosis of sleep apnea. he does not have a history of rheumatic fever.   Atrial Fibrillation Management history:  Previous antiarrhythmic drugs: none Previous cardioversions: none Previous ablations: none Anticoagulation history: Eliquis   ROS- All  systems are reviewed and negative except as per the HPI above.  Past Medical History:  Diagnosis Date   Anemia    Anxiety    Arthritis    BPH (benign prostatic hyperplasia)    Status post biopsy in 2009. - w/ LUTS   Coronary artery disease    a. s/p CABG in 10/2023 with LIMA-LAD and SVG-Ramus at the time of AVR   Depression    Essential hypertension    Hyperlipidemia    On Crestor  - followed by PCP   Moderate calcific aortic stenosis 04/2017   a. s/p AVR with 23 mm Edwards Inspiris Resilia pericardial valve in 10/2023   Stroke (HCC) 2007   No true etiology noted   UTI (urinary tract infection)     Current Outpatient Medications  Medication Sig Dispense Refill   acetaminophen  (TYLENOL ) 325 MG tablet Take 1-2 tablets (325-650 mg total) by mouth every 4 (four) hours as needed for mild pain (pain score 1-3). (Patient taking differently: Take 325-650 mg by mouth as needed for mild pain (pain score 1-3).)     amiodarone  (PACERONE ) 200 MG tablet Take 1 tablet (200 mg total) by mouth 2 (two) times daily. 60 tablet 3   amLODipine  (NORVASC ) 5 MG tablet Take 1 tablet (5 mg total) by mouth daily. 30 tablet 3   apixaban  (ELIQUIS ) 5 MG TABS tablet Take 1 tablet (5 mg total) by mouth 2 (two) times daily. 60 tablet 1   aspirin  81 MG chewable tablet Chew 81 mg by mouth daily. Swallow whole.     docusate sodium  (COLACE) 100 MG capsule Take 1 capsule (100 mg total) by mouth 2 (two) times daily. (Patient taking  differently: Take 100 mg by mouth daily.)     finasteride  (PROSCAR ) 5 MG tablet Take 5 mg by mouth every evening.     furosemide  (LASIX ) 40 MG tablet Take 1 tablet (40 mg total) by mouth daily. 7 tablet 0   nitroGLYCERIN  (NITROSTAT ) 0.4 MG SL tablet Place 1 tablet (0.4 mg total) under the tongue every 5 (five) minutes as needed for chest pain. 25 tablet 3   potassium chloride  SA (KLOR-CON  M) 20 MEQ tablet Take 1 tablet (20 mEq total) by mouth daily. X 7 days, then change to only days you take a  Lasix  30 tablet 1   rosuvastatin  (CRESTOR ) 10 MG tablet Take 1 tablet (10 mg total) by mouth daily. 30 tablet 3   Ferrous Gluconate (IRON  27 PO) Take 27 mg by mouth daily. (Patient not taking: Reported on 08/13/2024)     No current facility-administered medications for this encounter.    Physical Exam: BP 116/70   Pulse 70   Ht 5' 2 (1.575 m)   Wt 70.3 kg   BMI 28.35 kg/m   GEN: Well nourished, well developed in no acute distress CARDIAC: Regular rate and rhythm, no murmurs, rubs, gallops RESPIRATORY:  Clear to auscultation without rales, wheezing or rhonchi  ABDOMEN: Soft, non-tender, non-distended EXTREMITIES:  No edema; No deformity    Wt Readings from Last 3 Encounters:  08/13/24 70.3 kg  08/07/24 70.3 kg  08/07/24 69.4 kg     EKG today demonstrates Atypical atrial flutter with 3:1 block Vent. rate 70 BPM PR interval 154 ms QRS duration 240 ms QT/QTcB 546/589 ms   Echo 08/01/24 demonstrated   1. 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 Mean TV gradient 4 mmHg. Trivial tricuspid regurgitation with grossly normal prosthetic function. The tricuspid valve is has been repaired/replaced.   2. The aortic valve has been repaired/replaced. Aortic valve  regurgitation is not visualized. There is a 21 mm KONECT RESILIA  pericardial valve graft present in the aortic position. Procedure Date:  06/26/2024. Mean AV gradient 7.7 mmHg suggesting normal  function. Dimentionless index 0.68.   3. Left pleural effusion present, likely at least moderate based on  available images.   4. Right ventricular systolic function is low normal. The right  ventricular size is normal. Tricuspid regurgitation signal is inadequate  for assessing PA pressure. Device wire present.   5. The mitral valve is degenerative. Moderate mitral valve regurgitation.  The mean mitral valve gradient is 4.0 mmHg. Moderate mitral annular  calcification.   6. Aortic dilatation noted. There is  borderline dilatation of the aortic  root, measuring 40 mm.   7. Left ventricular ejection fraction, by estimation, is 60 to 65%. Left  ventricular ejection fraction by 3D volume is 56 %. The left ventricle has  normal function. The left ventricle demonstrates regional wall motion  abnormalities (see scoring  diagram/findings for description). Left ventricular diastolic parameters  are indeterminate.   8. The inferior vena cava is dilated in size with <50% respiratory  variability, suggesting right atrial pressure of 15 mmHg.    CHA2DS2-VASc Score = 6  The patient's score is based upon: CHF History: 0 HTN History: 1 Diabetes History: 0 Stroke History: 2 Vascular Disease History: 1 Age Score: 2 Gender Score: 0       ASSESSMENT AND PLAN: Persistent Atrial Fibrillation/atrial flutter (ICD10:  I48.19) The patient's CHA2DS2-VASc score is 6, indicating a 9.7% annual risk of stroke.   Patient remains in atrial flutter, started 7/16. Symptomatically  improved.  Will decrease amiodarone  to 200 mg daily starting tomorrow now that he has completed loading. Will arrange for DCCV. He missed a dose of Eliquis  last Thursday. Will schedule DCCV at least 3 weeks from then.  Recent labs in care everywhere reviewed.  Continue Eliquis  5 mg BID  Secondary Hypercoagulable State (ICD10:  D68.69) The patient is at significant risk for stroke/thromboembolism based upon his CHA2DS2-VASc Score of 6.  Continue Apixaban  (Eliquis ). No bleeding issues.   High Risk Medication Monitoring (ICD 10: [id number]) Patient requires ongoing monitoring for anti-arrhythmic medication which has the potential to cause life threatening arrhythmias. Intervals on ECG acceptable for amiodarone  monitoring.   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    Informed Consent   Shared Decision Making/Informed Consent The risks (stroke, cardiac arrhythmias rarely resulting in the need for a temporary or permanent pacemaker, skin irritation or burns and complications associated with conscious sedation including aspiration, arrhythmia, respiratory failure and death), benefits (restoration of normal sinus rhythm) and alternatives of a direct current cardioversion were explained in detail to Mr. Douglass and he agrees to proceed.       [facility] [address] [place], [place] [address] [phone]

## 2024-08-13 NOTE — Patient Instructions (Addendum)
 Tomorrow reduce Amiodarone  to 200mg  once a day    Cardioversion scheduled for: Friday, September 12th   - Arrive at the [facility] ([address])  and check in with ADMITTING at 7:30am   - Do not eat or drink anything after midnight the night prior to your procedure.   - Take all your morning medication (except diabetic medications) with a sip of water prior to arrival.  - Do NOT miss any doses of your blood thinner - if you should miss a dose or take a dose more than 4 hours late -- please notify our office immediately.  - You will not be able to drive home after your procedure. Please ensure you have a responsible adult to drive you home. You will need someone with you for 24 hours post procedure.     - Expect to be in the procedural area approximately 2 hours.   - If you feel as if you go back into normal rhythm prior to scheduled cardioversion, please notify our office immediately.   If your procedure is canceled in the cardioversion suite you will be charged a cancellation fee.

## 2024-08-19 ENCOUNTER — Ambulatory Visit (INDEPENDENT_AMBULATORY_CARE_PROVIDER_SITE_OTHER)

## 2024-08-19 DIAGNOSIS — I4819 Other persistent atrial fibrillation: Secondary | ICD-10-CM | POA: Diagnosis not present

## 2024-08-20 ENCOUNTER — Observation Stay (HOSPITAL_COMMUNITY)
Admission: EM | Admit: 2024-08-20 | Discharge: 2024-08-21 | Disposition: A | Discharge status: Home / Self Care | Attending: Family Medicine | Admitting: Family Medicine

## 2024-08-20 ENCOUNTER — Emergency Department (HOSPITAL_COMMUNITY)

## 2024-08-20 ENCOUNTER — Other Ambulatory Visit: Payer: Self-pay

## 2024-08-20 ENCOUNTER — Encounter (HOSPITAL_COMMUNITY): Payer: Self-pay

## 2024-08-20 DIAGNOSIS — K8020 Calculus of gallbladder without cholecystitis without obstruction: Secondary | ICD-10-CM | POA: Diagnosis present

## 2024-08-20 DIAGNOSIS — I110 Hypertensive heart disease with heart failure: Secondary | ICD-10-CM | POA: Insufficient documentation

## 2024-08-20 DIAGNOSIS — R109 Unspecified abdominal pain: Secondary | ICD-10-CM | POA: Diagnosis present

## 2024-08-20 DIAGNOSIS — K8050 Calculus of bile duct without cholangitis or cholecystitis without obstruction: Secondary | ICD-10-CM

## 2024-08-20 DIAGNOSIS — I2510 Atherosclerotic heart disease of native coronary artery without angina pectoris: Secondary | ICD-10-CM | POA: Diagnosis not present

## 2024-08-20 DIAGNOSIS — I4892 Unspecified atrial flutter: Secondary | ICD-10-CM | POA: Diagnosis not present

## 2024-08-20 DIAGNOSIS — K810 Acute cholecystitis: Principal | ICD-10-CM | POA: Diagnosis present

## 2024-08-20 DIAGNOSIS — Z8673 Personal history of transient ischemic attack (TIA), and cerebral infarction without residual deficits: Secondary | ICD-10-CM | POA: Insufficient documentation

## 2024-08-20 DIAGNOSIS — I5032 Chronic diastolic (congestive) heart failure: Secondary | ICD-10-CM | POA: Diagnosis not present

## 2024-08-20 DIAGNOSIS — Z8679 Personal history of other diseases of the circulatory system: Secondary | ICD-10-CM | POA: Diagnosis not present

## 2024-08-20 DIAGNOSIS — I4891 Unspecified atrial fibrillation: Secondary | ICD-10-CM | POA: Diagnosis not present

## 2024-08-20 DIAGNOSIS — K8000 Calculus of gallbladder with acute cholecystitis without obstruction: Principal | ICD-10-CM | POA: Insufficient documentation

## 2024-08-20 DIAGNOSIS — K5730 Diverticulosis of large intestine without perforation or abscess without bleeding: Secondary | ICD-10-CM | POA: Diagnosis not present

## 2024-08-20 DIAGNOSIS — I700 Atherosclerosis of aorta: Secondary | ICD-10-CM | POA: Diagnosis not present

## 2024-08-20 DIAGNOSIS — K838 Other specified diseases of biliary tract: Secondary | ICD-10-CM | POA: Diagnosis not present

## 2024-08-20 DIAGNOSIS — K7689 Other specified diseases of liver: Secondary | ICD-10-CM | POA: Diagnosis not present

## 2024-08-20 DIAGNOSIS — Z79899 Other long term (current) drug therapy: Secondary | ICD-10-CM | POA: Diagnosis not present

## 2024-08-20 LAB — CBC WITH DIFFERENTIAL/PLATELET
Abs Immature Granulocytes: 0.05 K/uL (ref 0.00–0.07)
Basophils Absolute: 0 K/uL (ref 0.0–0.1)
Basophils Relative: 0 %
Eosinophils Absolute: 0 K/uL (ref 0.0–0.5)
Eosinophils Relative: 0 %
HCT: 38.5 % — ABNORMAL LOW (ref 39.0–52.0)
Hemoglobin: 11.9 g/dL — ABNORMAL LOW (ref 13.0–17.0)
Immature Granulocytes: 1 %
Lymphocytes Relative: 7 %
Lymphs Abs: 0.7 K/uL (ref 0.7–4.0)
MCH: 28.1 pg (ref 26.0–34.0)
MCHC: 30.9 g/dL (ref 30.0–36.0)
MCV: 90.8 fL (ref 80.0–100.0)
Monocytes Absolute: 0.5 K/uL (ref 0.1–1.0)
Monocytes Relative: 5 %
Neutro Abs: 7.9 K/uL — ABNORMAL HIGH (ref 1.7–7.7)
Neutrophils Relative %: 87 %
Platelets: 181 K/uL (ref 150–400)
RBC: 4.24 MIL/uL (ref 4.22–5.81)
RDW: 15.9 % — ABNORMAL HIGH (ref 11.5–15.5)
WBC: 9.2 K/uL (ref 4.0–10.5)
nRBC: 0 % (ref 0.0–0.2)

## 2024-08-20 LAB — COMPREHENSIVE METABOLIC PANEL WITH GFR
ALT: 22 U/L (ref 0–44)
AST: 26 U/L (ref 15–41)
Albumin: 3.9 g/dL (ref 3.5–5.0)
Alkaline Phosphatase: 85 U/L (ref 38–126)
Anion gap: 16 — ABNORMAL HIGH (ref 5–15)
BUN: 18 mg/dL (ref 8–23)
CO2: 21 mmol/L — ABNORMAL LOW (ref 22–32)
Calcium: 9.4 mg/dL (ref 8.9–10.3)
Chloride: 101 mmol/L (ref 98–111)
Creatinine, Ser: 0.86 mg/dL (ref 0.61–1.24)
GFR, Estimated: >60 mL/min (ref >60)
Glucose, Bld: 133 mg/dL — ABNORMAL HIGH (ref 70–99)
Potassium: 4 mmol/L (ref 3.5–5.1)
Sodium: 138 mmol/L (ref 135–145)
Total Bilirubin: 0.6 mg/dL (ref 0.0–1.2)
Total Protein: 8.2 g/dL — ABNORMAL HIGH (ref 6.5–8.1)

## 2024-08-20 LAB — URINALYSIS, ROUTINE W REFLEX MICROSCOPIC
Bacteria, UA: NONE SEEN
Bilirubin Urine: NEGATIVE
Glucose, UA: NEGATIVE mg/dL
Ketones, ur: NEGATIVE mg/dL
Leukocytes,Ua: NEGATIVE
Nitrite: NEGATIVE
Protein, ur: 100 mg/dL — AB
Specific Gravity, Urine: 1.017 (ref 1.005–1.030)
pH: 6 (ref 5.0–8.0)

## 2024-08-20 LAB — LIPASE, BLOOD: Lipase: 28 U/L (ref 11–51)

## 2024-08-20 MED ORDER — PIPERACILLIN-TAZOBACTAM 3.375 G IVPB
3.3750 g | Freq: Three times a day (TID) | INTRAVENOUS | Status: DC
  Administered 2024-08-21: 3.375 g via INTRAVENOUS
  Filled 2024-08-20: qty 50

## 2024-08-20 MED ORDER — ROSUVASTATIN CALCIUM 10 MG PO TABS
10.0000 mg | ORAL_TABLET | Freq: Every day | ORAL | Status: DC
  Administered 2024-08-21: 10 mg via ORAL
  Filled 2024-08-20: qty 1

## 2024-08-20 MED ORDER — IOHEXOL 300 MG/ML  SOLN
100.0000 mL | Freq: Once | INTRAMUSCULAR | Status: AC | PRN
  Administered 2024-08-20: 100 mL via INTRAVENOUS

## 2024-08-20 MED ORDER — PIPERACILLIN-TAZOBACTAM 3.375 G IVPB 30 MIN
3.3750 g | Freq: Once | INTRAVENOUS | Status: AC
  Administered 2024-08-20: 3.375 g via INTRAVENOUS
  Filled 2024-08-20: qty 50

## 2024-08-20 MED ORDER — ACETAMINOPHEN 650 MG RE SUPP: 650.0000 mg | Freq: Four times a day (QID) | RECTAL | Status: DC | PRN

## 2024-08-20 MED ORDER — AMIODARONE HCL 200 MG PO TABS
200.0000 mg | ORAL_TABLET | Freq: Every day | ORAL | Status: DC
  Administered 2024-08-21: 200 mg via ORAL
  Filled 2024-08-20: qty 1

## 2024-08-20 MED ORDER — FENTANYL CITRATE (PF) 100 MCG/2ML IJ SOLN: 12.5000 ug | INTRAMUSCULAR | Status: DC | PRN

## 2024-08-20 MED ORDER — FINASTERIDE 5 MG PO TABS
5.0000 mg | ORAL_TABLET | Freq: Every evening | ORAL | Status: DC
  Administered 2024-08-20: 5 mg via ORAL
  Filled 2024-08-20: qty 1

## 2024-08-20 MED ORDER — PROCHLORPERAZINE EDISYLATE 10 MG/2ML IJ SOLN: 5.0000 mg | INTRAMUSCULAR | Status: DC | PRN

## 2024-08-20 MED ORDER — OXYCODONE HCL 5 MG PO TABS: 5.0000 mg | ORAL_TABLET | ORAL | Status: DC | PRN

## 2024-08-20 MED ORDER — POLYETHYLENE GLYCOL 3350 17 G PO PACK: 17.0000 g | PACK | Freq: Every day | ORAL | Status: DC | PRN

## 2024-08-20 MED ORDER — ACETAMINOPHEN 325 MG PO TABS
650.0000 mg | ORAL_TABLET | Freq: Four times a day (QID) | ORAL | Status: DC | PRN
Start: 2024-08-20 — End: 2024-08-21
  Administered 2024-08-20: 650 mg via ORAL
  Filled 2024-08-20: qty 2

## 2024-08-20 NOTE — Progress Notes (Signed)
 [facility]  Ruq pain possible cholecystitis on CT, cbd may be a little dilated, lft normal.  Will need US  in am. Lfts and lipase in AM. May need further imaging  Treat antibiotics for now. Reported complicated cardiac history, if very complicated may need to go to [facility]   Manuelita Pander, [place]

## 2024-08-20 NOTE — ED Notes (Signed)
 Pt ambulated to restroom with  steady gait. Kellogg RN

## 2024-08-20 NOTE — H&P (Signed)
 History and Physical    Kyle Matthews FMW:[id number] DOB: [birth year] DOA: 08/20/2024  PCP: Espinoza, Alejandra, DO   Patient coming from: Home   Chief Complaint: Right-sided abdominal pain, N/V   HPI: Kyle Matthews is a 78 y.o. male with medical history significant for hypertension, hyperlipidemia, CAD status post CABG, history of bioprosthetic AVR, TVR, atrial fibrillation on Eliquis , heart block with pacer, history of CVA, and BPH status post prostatectomy in June 2025 who presents with right sided pain, nausea, and vomiting.  Patient reports occasional episodes of pain in the right side of his abdomen since June 2025.  He developed more severe pain in the same spot last night after a spicy meal.  The pain last night was much more severe and persistent, keeping him from sleep last night.  He also had numerous episodes of nonbloody vomiting associated with this.  He denies subjective fever or chills.  ED Course: Upon arrival to the ED, patient is found to be afebrile and saturating well on room air with normal HR and stable BP.  Labs are most notable for normal creatinine, normal LFTs, normal lipase, and normal WBC.  CT demonstrates cholelithiasis with mild pericholecystic inflammatory stranding and 10 mm CBD.  Surgery was consulted by the ED PA and the patient was treated with Zosyn .  Review of Systems:  All other systems reviewed and apart from HPI, are negative.  Past Medical History:  Diagnosis Date   Anemia    Anxiety    Arthritis    BPH (benign prostatic hyperplasia)    Status post biopsy in 2009. - w/ LUTS   Coronary artery disease    a. s/p CABG in 10/2023 with LIMA-LAD and SVG-Ramus at the time of AVR   Depression    Essential hypertension    Hyperlipidemia    On Crestor  - followed by PCP   Moderate calcific aortic stenosis 04/2017   a. s/p AVR with 23 mm Edwards Inspiris Resilia pericardial valve in 10/2023   Stroke (HCC) 2007   No true etiology noted   UTI (urinary tract  infection)     Past Surgical History:  Procedure Laterality Date   AORTIC VALVE REPLACEMENT N/A 11/02/2023   Procedure: AORTIC VALVE REPLACEMENT (AVR) USING 23 MM INSPIRIS RESILIA AORTIC VALVE;  Surgeon: Lucas Dorise POUR, MD;  Location: [facility] OR;  Service: Open Heart Surgery;  Laterality: N/A;   BENTALL PROCEDURE N/A 06/26/2024   Procedure: BENTALL PROCEDURE USING KONECT AORTIC VALVE CONDUIT SIZE ;  Surgeon: Lucas Dorise POUR, MD;  Location: [facility] OR;  Service: Open Heart Surgery;  Laterality: N/A;   CORONARY ARTERY BYPASS GRAFT N/A 11/02/2023   Procedure: CORONARY ARTERY BYPASS GRAFTING (CABG) X TWO USING LEFT INTERNAL MAMMARY ARTERY AND RIGHT GREATER SAPHENEOUS VEIN HARVESTED ENDOSCOPICLY;  Surgeon: Lucas Dorise POUR, MD;  Location: [facility] OR;  Service: Open Heart Surgery;  Laterality: N/A;   CORONARY ARTERY BYPASS GRAFT N/A 06/26/2024   Procedure: CORONARY ARTERY BYPASS GRAFTING (CABG) TIMES ONE USING ENDOSCOPICALLY HARVESTED LEFT GREATER SAPHENOUS VEIN;  Surgeon: Lucas Dorise POUR, MD;  Location: [facility];  Service: Open Heart Surgery;  Laterality: N/A;   CORONARY PRESSURE/FFR STUDY N/A 10/09/2023   Procedure: CORONARY PRESSURE/FFR STUDY;  Surgeon: Wendel Lurena POUR, MD;  Location: [facility];  Service: Cardiovascular;  Laterality: N/A;   IR ANGIOGRAM PELVIS SELECTIVE OR SUPRASELECTIVE  10/27/2023   IR ANGIOGRAM PELVIS SELECTIVE OR SUPRASELECTIVE  10/27/2023   IR ANGIOGRAM SELECTIVE EACH ADDITIONAL VESSEL  10/27/2023   IR ANGIOGRAM  SELECTIVE EACH ADDITIONAL VESSEL  10/27/2023   IR ANGIOGRAM SELECTIVE EACH ADDITIONAL VESSEL  10/27/2023   IR ANGIOGRAM SELECTIVE EACH ADDITIONAL VESSEL  10/27/2023   IR EMBO ARTERIAL NOT HEMORR HEMANG INC GUIDE ROADMAPPING  10/27/2023   IR US  GUIDE VASC ACCESS RIGHT  10/27/2023   PACEMAKER IMPLANT N/A 07/01/2024   Procedure: PACEMAKER IMPLANT;  Surgeon: Inocencio Soyla Lunger, MD;  Location: [facility];  Service: Cardiovascular;  Laterality: N/A;   REDO STERNOTOMY N/A  06/26/2024   Procedure: REDO STERNOTOMY;  Surgeon: Lucas Dorise POUR, MD;  Location: [facility];  Service: Open Heart Surgery;  Laterality: N/A;   RIGHT HEART CATH AND CORONARY ANGIOGRAPHY N/A 06/20/2024   Procedure: RIGHT HEART CATH AND CORONARY ANGIOGRAPHY;  Surgeon: Ladona Heinz, MD;  Location: [facility];  Service: Cardiovascular;  Laterality: N/A;   RIGHT/LEFT HEART CATH AND CORONARY ANGIOGRAPHY N/A 10/09/2023   Procedure: RIGHT/LEFT HEART CATH AND CORONARY ANGIOGRAPHY;  Surgeon: Wendel Lurena POUR, MD;  Location: [facility];  Service: Cardiovascular;  Laterality: N/A;   TEE WITHOUT CARDIOVERSION N/A 11/02/2023   Procedure: TRANSESOPHAGEAL ECHOCARDIOGRAM;  Surgeon: Lucas Dorise POUR, MD;  Location: [facility] OR;  Service: Open Heart Surgery;  Laterality: N/A;   TEE WITHOUT CARDIOVERSION N/A 06/26/2024   Procedure: ECHOCARDIOGRAM, TRANSESOPHAGEAL;  Surgeon: Lucas Dorise POUR, MD;  Location: [facility];  Service: Open Heart Surgery;  Laterality: N/A;   TRANSESOPHAGEAL ECHOCARDIOGRAM (CATH LAB) N/A 06/18/2024   Procedure: TRANSESOPHAGEAL ECHOCARDIOGRAM;  Surgeon: Jeffrie Oneil BROCKS, MD;  Location: [facility];  Service: Cardiovascular;  Laterality: N/A;   TRANSTHORACIC ECHOCARDIOGRAM  04/28/2017    EF 65-70%. GR 1 DD. No regional wall motion abnormality. Moderate aortic stenosis with moderate regurgitation. Mean gradient 34 mmHg.   TRICUSPID VALVE REPLACEMENT N/A 06/26/2024   Procedure: TRICUSPID VALVE REPLACEMENT USING MOSIAC BRIOPROSTHESIS VALVE SIZE ;  Surgeon: Lucas Dorise POUR, MD;  Location: [facility] OR;  Service: Open Heart Surgery;  Laterality: N/A;   XI ROBOTIC ASSISTED SIMPLE PROSTATECTOMY N/A 05/22/2024   Procedure: PROSTATECTOMY, SIMPLE, ROBOT-ASSISTED;  Surgeon: Alvaro Ricardo KATHEE Mickey., MD;  Location: [facility] OR[facility];  Service: Urology;  Laterality: N/A;    Social History:   reports that he quit smoking about a year ago. His smoking use included cigarettes. He has a 36 pack-year smoking history. He has been exposed  to tobacco smoke. He has quit using smokeless tobacco. He reports that he does not currently use alcohol after a past usage of about 6.0 standard drinks of alcohol per week. He reports that he does not use drugs.  Allergies  Allergen Reactions   Tetanus Toxoids Other (See Comments)    Childhood Allergy    Zetia  [Ezetimibe ] Diarrhea    Family History  Problem Relation Age of Onset   Coronary artery disease Mother 58       51. Was diagnosed with a blood clot - suspect that this was a STEMI   Lung cancer Father    Thyroid  disease Sister    Healthy Brother 80   Pancreatic cancer Sister 26   Cancer Maternal Grandmother    Heart disease Maternal Grandfather 20   Cancer Paternal Grandmother    Stroke Paternal Grandfather      Prior to Admission medications   Medication Sig Start Date End Date Taking? Authorizing Provider  acetaminophen  (TYLENOL ) 325 MG tablet Take 1-2 tablets (325-650 mg total) by mouth every 4 (four) hours as needed for mild pain (pain score 1-3). Patient taking differently: Take  325-650 mg by mouth as needed for mild pain (pain score 1-3). 07/04/24   Barrett, Erin R, PA-C  amiodarone  (PACERONE ) 200 MG tablet Take 1 tablet (200 mg total) by mouth daily. 08/13/24   Fenton, Clint R, PA  amLODipine  (NORVASC ) 5 MG tablet Take 1 tablet (5 mg total) by mouth daily. 07/04/24   Barrett, Erin R, PA-C  apixaban  (ELIQUIS ) 5 MG TABS tablet Take 1 tablet (5 mg total) by mouth 2 (two) times daily. 07/05/24   Aniceto Daphne CROME, NP  aspirin  81 MG chewable tablet Chew 81 mg by mouth daily. Swallow whole.    [provider]  docusate sodium  (COLACE) 100 MG capsule Take 1 capsule (100 mg total) by mouth 2 (two) times daily. Patient taking differently: Take 100 mg by mouth daily. 05/22/24   Cory Palma, PA-C  Ferrous Gluconate (IRON  27 PO) Take 27 mg by mouth daily. Patient not taking: Reported on 08/13/2024    [provider]  finasteride  (PROSCAR ) 5 MG tablet Take 5 mg by  mouth every evening.    [provider]  furosemide  (LASIX ) 40 MG tablet Take 1 tablet (40 mg total) by mouth daily. 07/04/24   Barrett, Erin R, PA-C  nitroGLYCERIN  (NITROSTAT ) 0.4 MG SL tablet Place 1 tablet (0.4 mg total) under the tongue every 5 (five) minutes as needed for chest pain. 10/19/23   Thukkani, Arun K, MD  potassium chloride  SA (KLOR-CON  M) 20 MEQ tablet Take 1 tablet (20 mEq total) by mouth daily. X 7 days, then change to only days you take a Lasix  07/09/24   Barrett, Erin R, PA-C  rosuvastatin  (CRESTOR ) 10 MG tablet Take 1 tablet (10 mg total) by mouth daily. 07/04/24   Shermon Rocky SAUNDERS, PA-C    Physical Exam: Vitals:   08/20/24 1630 08/20/24 1715 08/20/24 1745 08/20/24 1900  BP: 97/64 137/76 (!) 148/74 (!) 146/67  Pulse: 69 70 70 69  Resp: 17 18 16 16   Temp:    98.3 F (36.8 C)  TempSrc:    Oral  SpO2: 99% 96% 97% 99%  Weight:      Height:        Constitutional: NAD, calm  Eyes: PERTLA, lids and conjunctivae normal ENMT: Mucous membranes are moist. Posterior pharynx clear of any exudate or lesions.   Neck: supple, no masses  Respiratory: no wheezing, no crackles. No accessory muscle use.  Cardiovascular: S1 & S2 heard, regular rate and rhythm. Bilateral lower extremity edema.   Abdomen: Soft, tender in RUQ. Bowel sounds active.  Musculoskeletal: no clubbing / cyanosis. No joint deformity upper and lower extremities.   Skin: no significant rashes, lesions, ulcers. Warm, dry, well-perfused. Neurologic: CN 2-12 grossly intact. Moving all extremities. Alert and oriented.  Psychiatric: Pleasant. Cooperative.    Labs and Imaging on Admission: I have personally reviewed following labs and imaging studies  CBC: Recent Labs  Lab 08/20/24 1201  WBC 9.2  NEUTROABS 7.9*  HGB 11.9*  HCT 38.5*  MCV 90.8  PLT 181   Basic Metabolic Panel: Recent Labs  Lab 08/20/24 1201  NA 138  K 4.0  CL 101  CO2 21*  GLUCOSE 133*  BUN 18  CREATININE 0.86  CALCIUM  9.4    GFR: Estimated Creatinine Clearance: 62.9 mL/min (by C-G formula based on SCr of 0.86 mg/dL). Liver Function Tests: Recent Labs  Lab 08/20/24 1201  AST 26  ALT 22  ALKPHOS 85  BILITOT 0.6  PROT 8.2*  ALBUMIN  3.9   Recent  Labs  Lab 08/20/24 1201  LIPASE 28   No results for input(s): AMMONIA in the last 168 hours. Coagulation Profile: No results for input(s): INR, PROTIME in the last 168 hours. Cardiac Enzymes: No results for input(s): CKTOTAL, CKMB, CKMBINDEX, TROPONINI in the last 168 hours. BNP (last 3 results) No results for input(s): PROBNP in the last 8760 hours. HbA1C: No results for input(s): HGBA1C in the last 72 hours. CBG: No results for input(s): GLUCAP in the last 168 hours. Lipid Profile: No results for input(s): CHOL, HDL, LDLCALC, TRIG, CHOLHDL, LDLDIRECT in the last 72 hours. Thyroid  Function Tests: No results for input(s): TSH, T4TOTAL, FREET4, T3FREE, THYROIDAB in the last 72 hours. Anemia Panel: No results for input(s): VITAMINB12, FOLATE, FERRITIN, TIBC, IRON , RETICCTPCT in the last 72 hours. Urine analysis:    Component Value Date/Time   COLORURINE YELLOW 08/20/2024 1200   APPEARANCEUR CLEAR 08/20/2024 1200   LABSPEC 1.017 08/20/2024 1200   PHURINE 6.0 08/20/2024 1200   GLUCOSEU NEGATIVE 08/20/2024 1200   HGBUR SMALL (A) 08/20/2024 1200   BILIRUBINUR NEGATIVE 08/20/2024 1200   KETONESUR NEGATIVE 08/20/2024 1200   PROTEINUR 100 (A) 08/20/2024 1200   NITRITE NEGATIVE 08/20/2024 1200   LEUKOCYTESUR NEGATIVE 08/20/2024 1200   Sepsis Labs: @LABRCNTIP (procalcitonin:4,lacticidven:4) )No results found for this or any previous visit (from the past 240 hours).   Radiological Exams on Admission: CT ABDOMEN PELVIS W CONTRAST Result Date: 08/20/2024 CLINICAL DATA:  Acute abdominal pain, right-sided. History of prostate surgery. EXAM: CT ABDOMEN AND PELVIS WITH CONTRAST TECHNIQUE: Multidetector CT  imaging of the abdomen and pelvis was performed using the standard protocol following bolus administration of intravenous contrast. RADIATION DOSE REDUCTION: This exam was performed according to the departmental dose-optimization program which includes automated exposure control, adjustment of the mA and/or kV according to patient size and/or use of iterative reconstruction technique. CONTRAST:  OMNIPAQUE  IOHEXOL  300 MG/ML  SOLN COMPARISON:  CT chest abdomen and pelvis 10/10/2023. FINDINGS: Lower chest: There is a trace right pleural effusion. There is a small amount of airspace disease in the right lower lobe. Hepatobiliary: Multiple gallstones are present. There is mild pericholecystic inflammatory stranding. The common bile duct is dilated measuring 1 cm. There is fatty infiltration of the liver with some focal fatty sparing along the falciform ligament. There is a cyst in the right lobe of the liver measuring 17 mm. Smaller cysts are also identified in the left lobe. Pancreas: Unremarkable. No pancreatic ductal dilatation or surrounding inflammatory changes. Spleen: Normal in size without focal abnormality. Calcified granulomas are present. Adrenals/Urinary Tract: There are multiple hypodensities in both kidneys which are too small to characterize, favored as cysts. There is a complex cyst in the right kidney with thin peripheral calcifications measuring 6.2 cm, unchanged. There is no hydronephrosis or perinephric fluid. Adrenal glands are within normal limits. There is diffuse bladder wall thickening. Stomach/Bowel: Stomach is within normal limits. Appendix appears normal. No evidence of bowel wall thickening, distention, or inflammatory changes. There is sigmoid colon diverticulosis. Vascular/Lymphatic: Aortic atherosclerosis. No enlarged abdominal or pelvic lymph nodes. Reproductive: Prostate gland is now smaller absent, a new finding. Other: No abdominal wall hernia or abnormality. No abdominopelvic  ascites. Musculoskeletal: Degenerative changes affect the spine. IMPRESSION: 1. Cholelithiasis with mild pericholecystic inflammatory stranding. Findings are concerning for acute cholecystitis. 2. Dilated common bile duct measuring 1 cm. Correlate clinically for obstruction. 3. Trace right pleural effusion with right lower lobe airspace disease. 4. Diffuse bladder wall thickening worrisome for cystitis. 5. Colonic  diverticulosis. 6. Aortic atherosclerosis. Aortic Atherosclerosis (ICD10-I70.0). Electronically Signed   By: Greig Pique M.D.   On: 08/20/2024 18:33    EKG: Independently reviewed. Atrial flutter.   Assessment/Plan   1. Acute cholecystitis; dilated CBD  - Not septic on admission; LFTs and lipase are normal  - Bowel rest, Zosyn , symptom-management, check RUQ US , repeat LFTs and lipase in AM  2. CAD  - No recent angina  - Continue Crestor     3. Atrial fibrillation and flutter  - Continue amiodarone , hold Eliquis  in anticipation of possible surgery    4. Chronic HFpEF  - Appears compensated   - Hold Lasix  given N/V and NPO status, monitor weight and I/Os    5. Hypertension  - BP marginal in ED and Norvasc  held on admission   6. HLD  - Crestor     DVT prophylaxis: SCDs  Code Status: Full  Level of Care: Level of care: Telemetry Family Communication: Wife at bedside   Disposition Plan:  Patient is from: home  Anticipated d/c is to: TBD Anticipated d/c date is: 08/24/24 Patient currently: Pending RUQ US , surgical consultation  Consults called: Surgery  Admission status: Inpatient     Evalene GORMAN Sprinkles, MD [facility]  08/20/2024, 8:16 PM

## 2024-08-20 NOTE — ED Notes (Signed)
 ED TO INPATIENT HANDOFF REPORT  ED Nurse Name and Phone #:    Bertina Solum RN [phone] S Name/Age/Gender Kyle Matthews 78 y.o. male Room/Bed: APA18/APA18  Code Status   Code Status: Full Code  Home/SNF/Other Home Patient oriented to: self, place, time, and situation Is this baseline? Yes   Triage Complete: Triage complete  Chief Complaint Acute cholecystitis [K81.0]  Triage Note Pt arrived via POV from home c/o RUQ pain since recent prostate surgery in June. Pt endorses N/V associated with the pain. Pt reports concern his gallbladder is causing his discomfort.    Allergies Allergies  Allergen Reactions   Tetanus Toxoids Other (See Comments)    Childhood Allergy    Zetia  [Ezetimibe ] Diarrhea    Level of Care/Admitting Diagnosis ED Disposition     ED Disposition  Admit   Condition  --   Comment  Hospital Area: [facility], [facility] [[id number]]  Level of Care: Telemetry [5]  Covid Evaluation: Asymptomatic - no recent exposure (last 10 days) testing not required  Diagnosis: Acute cholecystitis [575.0.ICD-9-CM]  Admitting Physician: CHARLTON EVALENE RAMAN [[id number]]  Attending Physician: CHARLTON EVALENE RAMAN [[id number]]  Certification:: I certify this patient will need inpatient services for at least 2 midnights  Expected Medical Readiness: 08/23/2024          B Medical/Surgery History Past Medical History:  Diagnosis Date   Anemia    Anxiety    Arthritis    BPH (benign prostatic hyperplasia)    Status post biopsy in 2009. - w/ LUTS   Coronary artery disease    a. s/p CABG in 10/2023 with LIMA-LAD and SVG-Ramus at the time of AVR   Depression    Essential hypertension    Hyperlipidemia    On Crestor  - followed by PCP   Moderate calcific aortic stenosis 04/2017   a. s/p AVR with 23 mm Edwards Inspiris Resilia pericardial valve in 10/2023   Stroke (HCC) 2007   No true etiology noted   UTI (urinary tract infection)    Past Surgical History:  Procedure Laterality Date    AORTIC VALVE REPLACEMENT N/A 11/02/2023   Procedure: AORTIC VALVE REPLACEMENT (AVR) USING 23 MM INSPIRIS RESILIA AORTIC VALVE;  Surgeon: Lucas Dorise POUR, MD;  Location: [facility] OR;  Service: Open Heart Surgery;  Laterality: N/A;   BENTALL PROCEDURE N/A 06/26/2024   Procedure: BENTALL PROCEDURE USING KONECT AORTIC VALVE CONDUIT SIZE ;  Surgeon: Lucas Dorise POUR, MD;  Location: [facility] OR;  Service: Open Heart Surgery;  Laterality: N/A;   CORONARY ARTERY BYPASS GRAFT N/A 11/02/2023   Procedure: CORONARY ARTERY BYPASS GRAFTING (CABG) X TWO USING LEFT INTERNAL MAMMARY ARTERY AND RIGHT GREATER SAPHENEOUS VEIN HARVESTED ENDOSCOPICLY;  Surgeon: Lucas Dorise POUR, MD;  Location: [facility] OR;  Service: Open Heart Surgery;  Laterality: N/A;   CORONARY ARTERY BYPASS GRAFT N/A 06/26/2024   Procedure: CORONARY ARTERY BYPASS GRAFTING (CABG) TIMES ONE USING ENDOSCOPICALLY HARVESTED LEFT GREATER SAPHENOUS VEIN;  Surgeon: Lucas Dorise POUR, MD;  Location: [facility] OR;  Service: Open Heart Surgery;  Laterality: N/A;   CORONARY PRESSURE/FFR STUDY N/A 10/09/2023   Procedure: CORONARY PRESSURE/FFR STUDY;  Surgeon: Wendel Lurena POUR, MD;  Location: [facility];  Service: Cardiovascular;  Laterality: N/A;   IR ANGIOGRAM PELVIS SELECTIVE OR SUPRASELECTIVE  10/27/2023   IR ANGIOGRAM PELVIS SELECTIVE OR SUPRASELECTIVE  10/27/2023   IR ANGIOGRAM SELECTIVE EACH ADDITIONAL VESSEL  10/27/2023   IR ANGIOGRAM SELECTIVE EACH ADDITIONAL VESSEL  10/27/2023   IR ANGIOGRAM SELECTIVE EACH ADDITIONAL VESSEL  10/27/2023   IR ANGIOGRAM SELECTIVE EACH ADDITIONAL VESSEL  10/27/2023   IR EMBO ARTERIAL NOT HEMORR HEMANG INC GUIDE ROADMAPPING  10/27/2023   IR US  GUIDE VASC ACCESS RIGHT  10/27/2023   PACEMAKER IMPLANT N/A 07/01/2024   Procedure: PACEMAKER IMPLANT;  Surgeon: Inocencio Soyla Lunger, MD;  [facility]: [facility];  Service: Cardiovascular;  Laterality: N/A;   REDO STERNOTOMY N/A 06/26/2024   Procedure: REDO STERNOTOMY;  Surgeon: Lucas Dorise POUR, MD;   Location: [facility];  Service: Open Heart Surgery;  Laterality: N/A;   RIGHT HEART CATH AND CORONARY ANGIOGRAPHY N/A 06/20/2024   Procedure: RIGHT HEART CATH AND CORONARY ANGIOGRAPHY;  Surgeon: Ladona Heinz, MD;  [facility];  Service: Cardiovascular;  Laterality: N/A;   RIGHT/LEFT HEART CATH AND CORONARY ANGIOGRAPHY N/A 10/09/2023   Procedure: RIGHT/LEFT HEART CATH AND CORONARY ANGIOGRAPHY;  Surgeon: Wendel Lurena POUR, MD;  [facility];  Service: Cardiovascular;  Laterality: N/A;   TEE WITHOUT CARDIOVERSION N/A 11/02/2023   Procedure: TRANSESOPHAGEAL ECHOCARDIOGRAM;  Surgeon: Lucas Dorise POUR, MD;  Location: [facility] OR;  Service: Open Heart Surgery;  Laterality: N/A;   TEE WITHOUT CARDIOVERSION N/A 06/26/2024   Procedure: ECHOCARDIOGRAM, TRANSESOPHAGEAL;  Surgeon: Lucas Dorise POUR, MD;  Location: [facility];  Service: Open Heart Surgery;  Laterality: N/A;   TRANSESOPHAGEAL ECHOCARDIOGRAM (CATH LAB) N/A 06/18/2024   Procedure: TRANSESOPHAGEAL ECHOCARDIOGRAM;  Surgeon: Jeffrie Oneil BROCKS, MD;  [facility]: [facility];  Service: Cardiovascular;  Laterality: N/A;   TRANSTHORACIC ECHOCARDIOGRAM  04/28/2017    EF 65-70%. GR 1 DD. No regional wall motion abnormality. Moderate aortic stenosis with moderate regurgitation. Mean gradient 34 mmHg.   TRICUSPID VALVE REPLACEMENT N/A 06/26/2024   Procedure: TRICUSPID VALVE REPLACEMENT USING MOSIAC BRIOPROSTHESIS VALVE SIZE ;  Surgeon: Lucas Dorise POUR, MD;  Location: [facility] OR;  Service: Open Heart Surgery;  Laterality: N/A;   XI ROBOTIC ASSISTED SIMPLE PROSTATECTOMY N/A 05/22/2024   Procedure: PROSTATECTOMY, SIMPLE, ROBOT-ASSISTED;  Surgeon: Alvaro Ricardo KATHEE Mickey., MD;  Location: [facility] ORS;  Service: Urology;  Laterality: N/A;     A IV Location/Drains/Wounds Patient Lines/Drains/Airways Status     Active Line/Drains/Airways     Name Placement date Placement time Site Days   Peripheral IV 08/20/24 20 G Right Antecubital 08/20/24  1530  Antecubital  less  than 1   PICC Single Lumen 07/03/24 Right Basilic 40 cm 0 cm 07/03/24  9079  Basilic  48   Wound 06/26/24 9066 Surgical Closed Surgical Incision Chest Other (Comment) 06/26/24  0933  Chest  55   Wound 06/26/24 1338 Surgical Closed Surgical Incision Leg Left 06/26/24  1338  Leg  55   Wound 07/01/24 1800 Surgical Closed Surgical Incision Chest Left;Upper 07/01/24  1800  Chest  50            Intake/Output Last 24 hours  Intake/Output Summary (Last 24 hours) at 08/20/2024 2019 Last data filed at 08/20/2024 8061 Gross per 24 hour  Intake --  Output 600 ml  Net -600 ml    Labs/Imaging Results for orders placed or performed during the hospital encounter of 08/20/24 (from the past 48 hours)  Urinalysis, Routine w reflex microscopic -Urine, Clean Catch     Status: Abnormal   Collection Time: 08/20/24 12:00 PM  Result Value Ref Range   Color, Urine YELLOW YELLOW   APPearance CLEAR CLEAR   Specific Gravity, Urine 1.017 1.005 - 1.030   pH 6.0 5.0 - 8.0   Glucose, UA NEGATIVE  NEGATIVE mg/dL   Hgb urine dipstick SMALL (A) NEGATIVE   Bilirubin Urine NEGATIVE NEGATIVE   Ketones, ur NEGATIVE NEGATIVE mg/dL   Protein, ur 899 (A) NEGATIVE mg/dL   Nitrite NEGATIVE NEGATIVE   Leukocytes,Ua NEGATIVE NEGATIVE   RBC / HPF 0-5 0 - 5 RBC/hpf   WBC, UA 6-10 0 - 5 WBC/hpf   Bacteria, UA NONE SEEN NONE SEEN   Squamous Epithelial / HPF 0-5 0 - 5 /HPF   Mucus PRESENT     Comment: Performed at [facility], [address], [place], [place] [address]  Lipase, blood     Status: None   Collection Time: 08/20/24 12:01 PM  Result Value Ref Range   Lipase 28 11 - 51 U/L    Comment: Performed at [facility], [address] [place], [place] [address]  Comprehensive metabolic panel     Status: Abnormal   Collection Time: 08/20/24 12:01 PM  Result Value Ref Range   Sodium 138 135 - 145 mmol/L   Potassium 4.0 3.5 - 5.1 mmol/L   Chloride 101 98 - 111 mmol/L   CO2 21 (L) 22 - 32 mmol/L   Glucose, Bld 133 (H)  70 - 99 mg/dL    Comment: Glucose reference range applies only to samples taken after fasting for at least 8 hours.   BUN 18 8 - 23 mg/dL   Creatinine, Ser 9.13 0.61 - 1.24 mg/dL   Calcium  9.4 8.9 - 10.3 mg/dL   Total Protein 8.2 (H) 6.5 - 8.1 g/dL   Albumin  3.9 3.5 - 5.0 g/dL   AST 26 15 - 41 U/L   ALT 22 0 - 44 U/L   Alkaline Phosphatase 85 38 - 126 U/L   Total Bilirubin 0.6 0.0 - 1.2 mg/dL   GFR, Estimated >39 >39 mL/min    Comment: (NOTE) Calculated using the CKD-EPI Creatinine Equation (2021)    Anion gap 16 (H) 5 - 15    Comment: Performed at [facility], [address], [place], [place] [address]  CBC with Differential     Status: Abnormal   Collection Time: 08/20/24 12:01 PM  Result Value Ref Range   WBC 9.2 4.0 - 10.5 K/uL   RBC 4.24 4.22 - 5.81 MIL/uL   Hemoglobin 11.9 (L) 13.0 - 17.0 g/dL   HCT 61.4 (L) 60.9 - 47.9 %   MCV 90.8 80.0 - 100.0 fL   MCH 28.1 26.0 - 34.0 pg   MCHC 30.9 30.0 - 36.0 g/dL   RDW 84.0 (H) 88.4 - 84.4 %   Platelets 181 150 - 400 K/uL   nRBC 0.0 0.0 - 0.2 %   Neutrophils Relative % 87 %   Neutro Abs 7.9 (H) 1.7 - 7.7 K/uL   Lymphocytes Relative 7 %   Lymphs Abs 0.7 0.7 - 4.0 K/uL   Monocytes Relative 5 %   Monocytes Absolute 0.5 0.1 - 1.0 K/uL   Eosinophils Relative 0 %   Eosinophils Absolute 0.0 0.0 - 0.5 K/uL   Basophils Relative 0 %   Basophils Absolute 0.0 0.0 - 0.1 K/uL   Immature Granulocytes 1 %   Abs Immature Granulocytes 0.05 0.00 - 0.07 K/uL    Comment: Performed at [facility], [address] [place], [place] [address]   CT ABDOMEN PELVIS W CONTRAST Result Date: 08/20/2024 CLINICAL DATA:  Acute abdominal pain, right-sided. History of prostate surgery. EXAM: CT ABDOMEN AND PELVIS WITH CONTRAST TECHNIQUE: Multidetector CT imaging of the abdomen and pelvis was  performed using the standard protocol following bolus administration of intravenous contrast. RADIATION DOSE REDUCTION: This exam was performed according to the departmental  dose-optimization program which includes automated exposure control, adjustment of the mA and/or kV according to patient size and/or use of iterative reconstruction technique. CONTRAST:  OMNIPAQUE  IOHEXOL  300 MG/ML  SOLN COMPARISON:  CT chest abdomen and pelvis 10/10/2023. FINDINGS: Lower chest: There is a trace right pleural effusion. There is a small amount of airspace disease in the right lower lobe. Hepatobiliary: Multiple gallstones are present. There is mild pericholecystic inflammatory stranding. The common bile duct is dilated measuring 1 cm. There is fatty infiltration of the liver with some focal fatty sparing along the falciform ligament. There is a cyst in the right lobe of the liver measuring 17 mm. Smaller cysts are also identified in the left lobe. Pancreas: Unremarkable. No pancreatic ductal dilatation or surrounding inflammatory changes. Spleen: Normal in size without focal abnormality. Calcified granulomas are present. Adrenals/Urinary Tract: There are multiple hypodensities in both kidneys which are too small to characterize, favored as cysts. There is a complex cyst in the right kidney with thin peripheral calcifications measuring 6.2 cm, unchanged. There is no hydronephrosis or perinephric fluid. Adrenal glands are within normal limits. There is diffuse bladder wall thickening. Stomach/Bowel: Stomach is within normal limits. Appendix appears normal. No evidence of bowel wall thickening, distention, or inflammatory changes. There is sigmoid colon diverticulosis. Vascular/Lymphatic: Aortic atherosclerosis. No enlarged abdominal or pelvic lymph nodes. Reproductive: Prostate gland is now smaller absent, a new finding. Other: No abdominal wall hernia or abnormality. No abdominopelvic ascites. Musculoskeletal: Degenerative changes affect the spine. IMPRESSION: 1. Cholelithiasis with mild pericholecystic inflammatory stranding. Findings are concerning for acute cholecystitis. 2. Dilated common  bile duct measuring 1 cm. Correlate clinically for obstruction. 3. Trace right pleural effusion with right lower lobe airspace disease. 4. Diffuse bladder wall thickening worrisome for cystitis. 5. Colonic diverticulosis. 6. Aortic atherosclerosis. Aortic Atherosclerosis (ICD10-I70.0). Electronically Signed   By: Greig Pique M.D.   On: 08/20/2024 18:33    Pending Labs Unresulted Labs (From admission, onward)     Start     Ordered   08/21/24 0500  Lipase, blood  Tomorrow morning,   R        08/20/24 2016   08/21/24 0500  Comprehensive metabolic panel  Daily,   R      08/20/24 2016   08/21/24 0500  Magnesium   Tomorrow morning,   R        08/20/24 2016   08/21/24 0500  CBC  Daily,   R      08/20/24 2016            Vitals/Pain Today's Vitals   08/20/24 1630 08/20/24 1715 08/20/24 1745 08/20/24 1900  BP: 97/64 137/76 (!) 148/74 (!) 146/67  Pulse: 69 70 70 69  Resp: 17 18 16 16   Temp:    98.3 F (36.8 C)  TempSrc:    Oral  SpO2: 99% 96% 97% 99%  Weight:      Height:      PainSc:        Isolation Precautions No active isolations  Medications Medications  piperacillin -tazobactam (ZOSYN ) IVPB 3.375 g (3.375 g Intravenous New Bag/Given 08/20/24 2003)  amiodarone  (PACERONE ) tablet 200 mg (has no administration in time range)  rosuvastatin  (CRESTOR ) tablet 10 mg (has no administration in time range)  finasteride  (PROSCAR ) tablet 5 mg (has no administration in time range)  acetaminophen  (TYLENOL ) tablet 650 mg (has  no administration in time range)    Or  acetaminophen  (TYLENOL ) suppository 650 mg (has no administration in time range)  oxyCODONE  (Oxy IR/ROXICODONE ) immediate release tablet 5 mg (has no administration in time range)  fentaNYL  (SUBLIMAZE ) injection 12.5-50 mcg (has no administration in time range)  polyethylene glycol (MIRALAX  / GLYCOLAX ) packet 17 g (has no administration in time range)  prochlorperazine  (COMPAZINE ) injection 5 mg (has no administration in time  range)  iohexol  (OMNIPAQUE ) 300 MG/ML solution 100 mL (100 mLs Intravenous Contrast Given 08/20/24 1655)    Mobility walks     Focused Assessments Cardiac Assessment Handoff:    Lab Results  Component Value Date   CKTOTAL 60 07/02/2024   Lab Results  Component Value Date   DDIMER >20.00 (H) 06/12/2024   Does the Patient currently have chest pain? No    R Recommendations: See Admitting Provider Note  Report given to:   Additional Notes:

## 2024-08-20 NOTE — ED Triage Notes (Signed)
 Pt arrived via POV from home c/o RUQ pain since recent prostate surgery in June. Pt endorses N/V associated with the pain. Pt reports concern his gallbladder is causing his discomfort.

## 2024-08-20 NOTE — ED Notes (Signed)
 Dr Rolley at bedside. Kellogg RN

## 2024-08-20 NOTE — ED Provider Notes (Signed)
 [place] EMERGENCY DEPARTMENT AT [facility] Provider Note   CSN: [id number] Arrival date & time: 08/20/24  1130     Patient presents with: Abdominal Pain   Kyle Matthews is a 78 y.o. male.    Abdominal Pain Associated symptoms: fever, nausea and vomiting   Associated symptoms: no chest pain, no chills, no diarrhea and no shortness of breath        Kyle Matthews is a 78 y.o. male with hx of aortic valve replacement, CABG x 2 CHF, HTN, prior stroke and endocarditis chronically anticoagulated, who presents to the Emergency Department complaining of right sided abdominal pain since last night.  He has hx of prostate surgery performed in June of this year and endorses mild pain at the incision site, but pain became worse last night after eating spicy foods. Sx's have been associated with nausea and several episodes of vomiting that has persisted today.  Subjective low grade fever at home, denies dysuria , flank pain, diarrhea.  Also denies chest pain and shortness of breath  Prior to Admission medications   Medication Sig Start Date End Date Taking? Authorizing Provider  acetaminophen  (TYLENOL ) 325 MG tablet Take 1-2 tablets (325-650 mg total) by mouth every 4 (four) hours as needed for mild pain (pain score 1-3). Patient taking differently: Take 325-650 mg by mouth as needed for mild pain (pain score 1-3). 07/04/24   Barrett, Erin R, PA-C  amiodarone  (PACERONE ) 200 MG tablet Take 1 tablet (200 mg total) by mouth daily. 08/13/24   Fenton, Clint R, PA  amLODipine  (NORVASC ) 5 MG tablet Take 1 tablet (5 mg total) by mouth daily. 07/04/24   Barrett, Erin R, PA-C  apixaban  (ELIQUIS ) 5 MG TABS tablet Take 1 tablet (5 mg total) by mouth 2 (two) times daily. 07/05/24   Aniceto Daphne CROME, NP  aspirin  81 MG chewable tablet Chew 81 mg by mouth daily. Swallow whole.    [provider]  docusate sodium  (COLACE) 100 MG capsule Take 1 capsule (100 mg total) by mouth 2 (two) times daily. Patient  taking differently: Take 100 mg by mouth daily. 05/22/24   Cory Palma, PA-C  Ferrous Gluconate (IRON  27 PO) Take 27 mg by mouth daily. Patient not taking: Reported on 08/13/2024    [provider]  finasteride  (PROSCAR ) 5 MG tablet Take 5 mg by mouth every evening.    [provider]  furosemide  (LASIX ) 40 MG tablet Take 1 tablet (40 mg total) by mouth daily. 07/04/24   Barrett, Erin R, PA-C  nitroGLYCERIN  (NITROSTAT ) 0.4 MG SL tablet Place 1 tablet (0.4 mg total) under the tongue every 5 (five) minutes as needed for chest pain. 10/19/23   Thukkani, Arun K, MD  potassium chloride  SA (KLOR-CON  M) 20 MEQ tablet Take 1 tablet (20 mEq total) by mouth daily. X 7 days, then change to only days you take a Lasix  07/09/24   Barrett, Erin R, PA-C  rosuvastatin  (CRESTOR ) 10 MG tablet Take 1 tablet (10 mg total) by mouth daily. 07/04/24   Barrett, Rocky SAUNDERS, PA-C    Allergies: Tetanus toxoids and Zetia  [ezetimibe ]    Review of Systems  Constitutional:  Positive for fever. Negative for appetite change and chills.  Respiratory:  Negative for shortness of breath.   Cardiovascular:  Negative for chest pain.  Gastrointestinal:  Positive for abdominal pain, nausea and vomiting. Negative for blood in stool and diarrhea.  Genitourinary:  Negative for difficulty urinating, flank pain, penile pain, penile swelling, scrotal  swelling and testicular pain.  Musculoskeletal:  Negative for back pain.  Skin:  Negative for rash.  Neurological:  Negative for dizziness, weakness and headaches.    Updated Vital Signs BP (!) 148/73   Pulse 69   Temp 99 F (37.2 C)   Resp 18   Ht 5' 2 (1.575 m)   Wt 70.3 kg   SpO2 99%   BMI 28.35 kg/m   Physical Exam Vitals and nursing note reviewed.  Constitutional:      General: He is not in acute distress.    Appearance: He is well-developed. He is not ill-appearing.  HENT:     Mouth/Throat:     Mouth: Mucous membranes are moist.  Cardiovascular:     Rate  and Rhythm: Normal rate and regular rhythm.     Pulses: Normal pulses.  Pulmonary:     Effort: Pulmonary effort is normal.     Breath sounds: Normal breath sounds.  Abdominal:     Palpations: Abdomen is soft.     Tenderness: There is abdominal tenderness. There is no right CVA tenderness or left CVA tenderness.     Comments: Ttp of the RUQ.  No guarding or rebound tenderness.  Abd is soft, no distention  Musculoskeletal:        General: Normal range of motion.  Skin:    General: Skin is warm.     Capillary Refill: Capillary refill takes less than 2 seconds.  Neurological:     General: No focal deficit present.     Mental Status: He is alert.     Sensory: No sensory deficit.     Motor: No weakness.     (all labs ordered are listed, but only abnormal results are displayed) Labs Reviewed  COMPREHENSIVE METABOLIC PANEL WITH GFR - Abnormal; Notable for the following components:      Result Value   CO2 21 (*)    Glucose, Bld 133 (*)    Total Protein 8.2 (*)    Anion gap 16 (*)    All other components within normal limits  CBC WITH DIFFERENTIAL/PLATELET - Abnormal; Notable for the following components:   Hemoglobin 11.9 (*)    HCT 38.5 (*)    RDW 15.9 (*)    Neutro Abs 7.9 (*)    All other components within normal limits  LIPASE, BLOOD  URINALYSIS, ROUTINE W REFLEX MICROSCOPIC    EKG: None  Radiology: CT ABDOMEN PELVIS W CONTRAST Result Date: 08/20/2024 CLINICAL DATA:  Acute abdominal pain, right-sided. History of prostate surgery. EXAM: CT ABDOMEN AND PELVIS WITH CONTRAST TECHNIQUE: Multidetector CT imaging of the abdomen and pelvis was performed using the standard protocol following bolus administration of intravenous contrast. RADIATION DOSE REDUCTION: This exam was performed according to the departmental dose-optimization program which includes automated exposure control, adjustment of the mA and/or kV according to patient size and/or use of iterative reconstruction  technique. CONTRAST:  OMNIPAQUE  IOHEXOL  300 MG/ML  SOLN COMPARISON:  CT chest abdomen and pelvis 10/10/2023. FINDINGS: Lower chest: There is a trace right pleural effusion. There is a small amount of airspace disease in the right lower lobe. Hepatobiliary: Multiple gallstones are present. There is mild pericholecystic inflammatory stranding. The common bile duct is dilated measuring 1 cm. There is fatty infiltration of the liver with some focal fatty sparing along the falciform ligament. There is a cyst in the right lobe of the liver measuring 17 mm. Smaller cysts are also identified in the left lobe. Pancreas: Unremarkable. No  pancreatic ductal dilatation or surrounding inflammatory changes. Spleen: Normal in size without focal abnormality. Calcified granulomas are present. Adrenals/Urinary Tract: There are multiple hypodensities in both kidneys which are too small to characterize, favored as cysts. There is a complex cyst in the right kidney with thin peripheral calcifications measuring 6.2 cm, unchanged. There is no hydronephrosis or perinephric fluid. Adrenal glands are within normal limits. There is diffuse bladder wall thickening. Stomach/Bowel: Stomach is within normal limits. Appendix appears normal. No evidence of bowel wall thickening, distention, or inflammatory changes. There is sigmoid colon diverticulosis. Vascular/Lymphatic: Aortic atherosclerosis. No enlarged abdominal or pelvic lymph nodes. Reproductive: Prostate gland is now smaller absent, a new finding. Other: No abdominal wall hernia or abnormality. No abdominopelvic ascites. Musculoskeletal: Degenerative changes affect the spine. IMPRESSION: 1. Cholelithiasis with mild pericholecystic inflammatory stranding. Findings are concerning for acute cholecystitis. 2. Dilated common bile duct measuring 1 cm. Correlate clinically for obstruction. 3. Trace right pleural effusion with right lower lobe airspace disease. 4. Diffuse bladder wall  thickening worrisome for cystitis. 5. Colonic diverticulosis. 6. Aortic atherosclerosis. Aortic Atherosclerosis (ICD10-I70.0). Electronically Signed   By: Greig Pique M.D.   On: 08/20/2024 18:33     Procedures   Medications Ordered in the ED - No data to display                                  Medical Decision Making Pt with complicated cardiac hx, chronically anticoagulated.  Here with RUQ pain, nausea, fever and vomiting since last night.  Abdomen is tender over surgical site for prostate surgery from June of this year.  Symptoms began after eating spicy meal last evening woke early morning hours with vomiting, multiple episodes.  No fever here and no vomiting since this morning.  No CP or dyspnea here.   Pain is at incision from prostate surgery.  Differential would include but not limited to complications from his surgery, bowel obstruction, adhesions, with right upper quadrant pain and vomiting also concerning for gallbladder disease.  No flank pain or dysuria symptoms suggestive of kidney stone or pyelonephritis.    Amount and/or Complexity of Data Reviewed Labs: ordered.    Details: Labs without evidence of leukocytosis, lipase unremarkable, chemistries without significant derangement urinalysis without evidence of infection small amount of blood Radiology: ordered.    Details: CT abdomen and pelvis shows cholelithiasis with pericholecystic inflammatory stranding findings concerning for acute cholecystitis and dilated CBD at 1 cm Discussion of management or test interpretation with external provider(s): Patient resting comfortably.  No vomiting here.  Workup this evening concerning for gallstones and dilated CBD.  I have consulted with general surgery, Dr. Kallie who recommends medicine admit here with repeat LFTs and ultrasound tomorrow and to hold patient's Eliquis .  Discussed with [facility] hospitalist, Dr. Charlton who agrees to admit  Risk Prescription drug management. Decision  regarding hospitalization.        Final diagnoses:  Acute cholecystitis    ED Discharge Orders     None          Herlinda Milling, PA-C 08/20/24 2015    Melvenia Motto, MD 08/26/24 [phone]

## 2024-08-21 ENCOUNTER — Inpatient Hospital Stay (HOSPITAL_COMMUNITY)

## 2024-08-21 DIAGNOSIS — I4892 Unspecified atrial flutter: Secondary | ICD-10-CM | POA: Diagnosis not present

## 2024-08-21 DIAGNOSIS — K8000 Calculus of gallbladder with acute cholecystitis without obstruction: Secondary | ICD-10-CM | POA: Diagnosis not present

## 2024-08-21 DIAGNOSIS — I4891 Unspecified atrial fibrillation: Secondary | ICD-10-CM | POA: Diagnosis not present

## 2024-08-21 DIAGNOSIS — K838 Other specified diseases of biliary tract: Secondary | ICD-10-CM | POA: Diagnosis not present

## 2024-08-21 DIAGNOSIS — I700 Atherosclerosis of aorta: Secondary | ICD-10-CM | POA: Diagnosis not present

## 2024-08-21 DIAGNOSIS — Z79899 Other long term (current) drug therapy: Secondary | ICD-10-CM | POA: Diagnosis not present

## 2024-08-21 DIAGNOSIS — K8050 Calculus of bile duct without cholangitis or cholecystitis without obstruction: Secondary | ICD-10-CM | POA: Diagnosis not present

## 2024-08-21 DIAGNOSIS — K5730 Diverticulosis of large intestine without perforation or abscess without bleeding: Secondary | ICD-10-CM | POA: Diagnosis not present

## 2024-08-21 DIAGNOSIS — K8020 Calculus of gallbladder without cholecystitis without obstruction: Secondary | ICD-10-CM | POA: Diagnosis present

## 2024-08-21 DIAGNOSIS — K7689 Other specified diseases of liver: Secondary | ICD-10-CM | POA: Diagnosis not present

## 2024-08-21 DIAGNOSIS — K810 Acute cholecystitis: Secondary | ICD-10-CM | POA: Diagnosis not present

## 2024-08-21 DIAGNOSIS — Z8673 Personal history of transient ischemic attack (TIA), and cerebral infarction without residual deficits: Secondary | ICD-10-CM | POA: Diagnosis not present

## 2024-08-21 DIAGNOSIS — I5032 Chronic diastolic (congestive) heart failure: Secondary | ICD-10-CM | POA: Diagnosis not present

## 2024-08-21 DIAGNOSIS — I2510 Atherosclerotic heart disease of native coronary artery without angina pectoris: Secondary | ICD-10-CM | POA: Diagnosis not present

## 2024-08-21 DIAGNOSIS — I110 Hypertensive heart disease with heart failure: Secondary | ICD-10-CM | POA: Diagnosis not present

## 2024-08-21 LAB — COMPREHENSIVE METABOLIC PANEL WITH GFR
ALT: 19 U/L (ref 0–44)
AST: 19 U/L (ref 15–41)
Albumin: 3.6 g/dL (ref 3.5–5.0)
Alkaline Phosphatase: 74 U/L (ref 38–126)
Anion gap: 8 (ref 5–15)
BUN: 13 mg/dL (ref 8–23)
CO2: 24 mmol/L (ref 22–32)
Calcium: 9.1 mg/dL (ref 8.9–10.3)
Chloride: 107 mmol/L (ref 98–111)
Creatinine, Ser: 0.76 mg/dL (ref 0.61–1.24)
GFR, Estimated: >60 mL/min (ref >60)
Glucose, Bld: 90 mg/dL (ref 70–99)
Potassium: 3.5 mmol/L (ref 3.5–5.1)
Sodium: 139 mmol/L (ref 135–145)
Total Bilirubin: 0.5 mg/dL (ref 0.0–1.2)
Total Protein: 7.2 g/dL (ref 6.5–8.1)

## 2024-08-21 LAB — LIPASE, BLOOD: Lipase: 26 U/L (ref 11–51)

## 2024-08-21 LAB — CBC
HCT: 36.9 % — ABNORMAL LOW (ref 39.0–52.0)
Hemoglobin: 11.3 g/dL — ABNORMAL LOW (ref 13.0–17.0)
MCH: 27.8 pg (ref 26.0–34.0)
MCHC: 30.6 g/dL (ref 30.0–36.0)
MCV: 90.9 fL (ref 80.0–100.0)
Platelets: 177 K/uL (ref 150–400)
RBC: 4.06 MIL/uL — ABNORMAL LOW (ref 4.22–5.81)
RDW: 16 % — ABNORMAL HIGH (ref 11.5–15.5)
WBC: 6.9 K/uL (ref 4.0–10.5)
nRBC: 0 % (ref 0.0–0.2)

## 2024-08-21 LAB — MAGNESIUM: Magnesium: 2.3 mg/dL (ref 1.7–2.4)

## 2024-08-21 MED ORDER — AMLODIPINE BESYLATE 5 MG PO TABS: 2.5000 mg | ORAL_TABLET | Freq: Every day | ORAL | 3 refills | Status: DC

## 2024-08-21 MED ORDER — FENTANYL CITRATE PF 50 MCG/ML IJ SOSY: 12.5000 ug | PREFILLED_SYRINGE | INTRAMUSCULAR | Status: DC | PRN

## 2024-08-21 MED ORDER — SENNOSIDES-DOCUSATE SODIUM 8.6-50 MG PO TABS: 2.0000 | ORAL_TABLET | Freq: Every day | ORAL | 1 refills | Status: AC

## 2024-08-21 MED ORDER — FUROSEMIDE 40 MG PO TABS: 40.0000 mg | ORAL_TABLET | Freq: Every day | ORAL | 5 refills | Status: DC

## 2024-08-21 MED ORDER — PIPERACILLIN-TAZOBACTAM 3.375 G IVPB
3.3750 g | Freq: Three times a day (TID) | INTRAVENOUS | Status: DC
  Administered 2024-08-21: 3.375 g via INTRAVENOUS
  Filled 2024-08-21: qty 50

## 2024-08-21 MED ORDER — PANTOPRAZOLE SODIUM 40 MG PO TBEC: 40.0000 mg | DELAYED_RELEASE_TABLET | Freq: Every day | ORAL | 1 refills | Status: DC

## 2024-08-21 MED ORDER — ASPIRIN 81 MG PO CHEW
81.0000 mg | CHEWABLE_TABLET | Freq: Every day | ORAL | 5 refills | Status: DC
Start: 2024-08-21 — End: 2024-09-05

## 2024-08-21 NOTE — Discharge Summary (Signed)
 Kyle Matthews, is a 78 y.o. male  DOB 04-24-1946  MRN [id number].  Admission date:  08/20/2024  Admitting Physician  Rendall Carwin, MD  Discharge Date:  08/21/2024   Primary MD  Espinoza, Alejandra, DO  Recommendations for primary care physician for things to follow:  1)Avoid ibuprofen/Advil/Aleve/Motrin/Goody Powders/Naproxen/BC powders/Meloxicam/Diclofenac/Indomethacin and other Nonsteroidal anti-inflammatory medications as these will make you more likely to bleed and can cause stomach ulcers, can also cause Kidney problems.   2)Watch for bleeding while on Blood Thinners--watch for blood in your stool which can make your stool black, maroon, mahogany or red---, blood in your urine which can make your urine pink or red, nosebleeds , also watch for possible bruising -You are taking Apixaban /Eliquis --- which is a blood thinner--- be careful to avoid injury or falls  3) You will need surgery for your gallbladder at some point in the future, however surgery may have to be done sooner rather than later if you develop persistent right-sided abdominal pain, or fevers or chills or persistent vomiting-  4) please ask your cardiologist if you should restart your aspirin  therapy, you are taking Eliquis /apixaban  which is also a blood thinner  5)Low Fat Diet advised  -On a low-fat diet, you can eat plenty of fruits, vegetables, and whole grains like oats, brown rice, and quinoa. Lean protein sources such as chicken breast, [place] breast, fish, beans, and egg whites are excellent choices. Also, include low-fat dairy products like skim milk, low-fat yogurt, and cottage cheese. Small amounts of healthy fats from sources like olive oil and avocado can be used in moderation  Guidelines: Limit fat intake to 30% or less of total daily calories.  Choose lean protein sources such as chicken, fish, and beans.  Select low-fat dairy products  like skim milk, yogurt, and cheese. Consume plenty of fruits, vegetables, and whole grains.  Avoid processed foods, fried foods, and foods high in saturated and trans fats.   Recommended Foods: Fruits and vegetables: apples, bananas, berries, carrots, spinach Lean protein: chicken, fish, [place], eggs Whole grains: brown rice, quinoa, oats, whole wheat bread Low-fat dairy: skim milk, yogurt, cottage cheese Healthy fats: olive oil, avocado, nuts (in moderation)  Ensure you're getting enough essential fatty acids, such as omega-3s and omega-6s.   Sample Meal Plan:  Breakfast: Oatmeal with berries and nuts Lunch: Salad with grilled chicken and low-fat dressing Dinner: Baked salmon with roasted vegetables Snack: Greek yogurt with fruit  Remember, a low-fat diet should be sustainable and enjoyable. It's important to make gradual changes and find healthy food options that fit your lifestyle  Admission Diagnosis  Acute cholecystitis [K81.0] Cholelithiasis [K80.20]   Discharge Diagnosis  Acute cholecystitis [K81.0] Cholelithiasis [K80.20]    Principal Problem:   Acute cholecystitis Active Problems:   Chronic heart failure with preserved ejection fraction (HFpEF) (HCC)   Coronary artery disease   History of essential hypertension   History of stroke   Atrial fibrillation and flutter (HCC)   Recurrent biliary colic   Cholelithiasis      Past Medical  History:  Diagnosis Date   Anemia    Anxiety    Arthritis    BPH (benign prostatic hyperplasia)    Status post biopsy in 2009. - w/ LUTS   Coronary artery disease    a. s/p CABG in 10/2023 with LIMA-LAD and SVG-Ramus at the time of AVR   Depression    Essential hypertension    Hyperlipidemia    On Crestor  - followed by PCP   Moderate calcific aortic stenosis 04/2017   a. s/p AVR with 23 mm Edwards Inspiris Resilia pericardial valve in 10/2023   Stroke (HCC) 2007   No true etiology noted   UTI (urinary tract infection)      Past Surgical History:  Procedure Laterality Date   AORTIC VALVE REPLACEMENT N/A 11/02/2023   Procedure: AORTIC VALVE REPLACEMENT (AVR) USING 23 MM INSPIRIS RESILIA AORTIC VALVE;  Surgeon: Lucas Dorise POUR, MD;  Location: [facility] OR;  Service: Open Heart Surgery;  Laterality: N/A;   BENTALL PROCEDURE N/A 06/26/2024   Procedure: BENTALL PROCEDURE USING KONECT AORTIC VALVE CONDUIT SIZE ;  Surgeon: Lucas Dorise POUR, MD;  Location: [facility] OR;  Service: Open Heart Surgery;  Laterality: N/A;   CORONARY ARTERY BYPASS GRAFT N/A 11/02/2023   Procedure: CORONARY ARTERY BYPASS GRAFTING (CABG) X TWO USING LEFT INTERNAL MAMMARY ARTERY AND RIGHT GREATER SAPHENEOUS VEIN HARVESTED ENDOSCOPICLY;  Surgeon: Lucas Dorise POUR, MD;  Location: [facility] OR;  Service: Open Heart Surgery;  Laterality: N/A;   CORONARY ARTERY BYPASS GRAFT N/A 06/26/2024   Procedure: CORONARY ARTERY BYPASS GRAFTING (CABG) TIMES ONE USING ENDOSCOPICALLY HARVESTED LEFT GREATER SAPHENOUS VEIN;  Surgeon: Lucas Dorise POUR, MD;  Location: [facility];  Service: Open Heart Surgery;  Laterality: N/A;   CORONARY PRESSURE/FFR STUDY N/A 10/09/2023   Procedure: CORONARY PRESSURE/FFR STUDY;  Surgeon: Wendel Lurena POUR, MD;  [facility];  Service: Cardiovascular;  Laterality: N/A;   IR ANGIOGRAM PELVIS SELECTIVE OR SUPRASELECTIVE  10/27/2023   IR ANGIOGRAM PELVIS SELECTIVE OR SUPRASELECTIVE  10/27/2023   IR ANGIOGRAM SELECTIVE EACH ADDITIONAL VESSEL  10/27/2023   IR ANGIOGRAM SELECTIVE EACH ADDITIONAL VESSEL  10/27/2023   IR ANGIOGRAM SELECTIVE EACH ADDITIONAL VESSEL  10/27/2023   IR ANGIOGRAM SELECTIVE EACH ADDITIONAL VESSEL  10/27/2023   IR EMBO ARTERIAL NOT HEMORR HEMANG INC GUIDE ROADMAPPING  10/27/2023   IR US  GUIDE VASC ACCESS RIGHT  10/27/2023   PACEMAKER IMPLANT N/A 07/01/2024   Procedure: PACEMAKER IMPLANT;  Surgeon: Inocencio Soyla Lunger, MD;  [facility];  Service: Cardiovascular;  Laterality: N/A;   REDO STERNOTOMY N/A 06/26/2024    Procedure: REDO STERNOTOMY;  Surgeon: Lucas Dorise POUR, MD;  Location[facility];  Service: Open Heart Surgery;  Laterality: N/A;   RIGHT HEART CATH AND CORONARY ANGIOGRAPHY N/A 06/20/2024   Procedure: RIGHT HEART CATH AND CORONARY ANGIOGRAPHY;  Surgeon: Ladona Heinz, MD;  [facility];  Service: Cardiovascular;  Laterality: N/A;   RIGHT/LEFT HEART CATH AND CORONARY ANGIOGRAPHY N/A 10/09/2023   Procedure: RIGHT/LEFT HEART CATH AND CORONARY ANGIOGRAPHY;  Surgeon: Wendel Lurena POUR, MD;  [facility];  Service: Cardiovascular;  Laterality: N/A;   TEE WITHOUT CARDIOVERSION N/A 11/02/2023   Procedure: TRANSESOPHAGEAL ECHOCARDIOGRAM;  Surgeon: Lucas Dorise POUR, MD;  Location[facility];  Service: Open Heart Surgery;  Laterality: N/A;   TEE WITHOUT CARDIOVERSION N/A 06/26/2024   Procedure: ECHOCARDIOGRAM, TRANSESOPHAGEAL;  Surgeon: Lucas Dorise POUR, MD;  [facility];  Service: Open Heart Surgery;  Laterality: N/A;  TRANSESOPHAGEAL ECHOCARDIOGRAM (CATH LAB) N/A 06/18/2024   Procedure: TRANSESOPHAGEAL ECHOCARDIOGRAM;  Surgeon: Jeffrie Oneil BROCKS, MD;  [facility];  Service: Cardiovascular;  Laterality: N/A;   TRANSTHORACIC ECHOCARDIOGRAM  04/28/2017    EF 65-70%. GR 1 DD. No regional wall motion abnormality. Moderate aortic stenosis with moderate regurgitation. Mean gradient 34 mmHg.   TRICUSPID VALVE REPLACEMENT N/A 06/26/2024   Procedure: TRICUSPID VALVE REPLACEMENT USING MOSIAC BRIOPROSTHESIS VALVE SIZE ;  Surgeon: Lucas Dorise POUR, MD;  Location: [facility] OR;  Service: Open Heart Surgery;  Laterality: N/A;   XI ROBOTIC ASSISTED SIMPLE PROSTATECTOMY N/A 05/22/2024   Procedure: PROSTATECTOMY, SIMPLE, ROBOT-ASSISTED;  Surgeon: Alvaro Ricardo KATHEE Mickey., MD;  Location: [facility] ORS;  Service: Urology;  Laterality: N/A;     HPI  from the history and physical done on the day of admission:   Patient coming from: Home    Chief Complaint: Right-sided abdominal pain, N/V    HPI: Kyle Matthews is  a 78 y.o. male with medical history significant for hypertension, hyperlipidemia, CAD status post CABG, history of bioprosthetic AVR, TVR, atrial fibrillation on Eliquis , heart block with pacer, history of CVA, and BPH status post prostatectomy in June 2025 who presents with right sided pain, nausea, and vomiting.   Patient reports occasional episodes of pain in the right side of his abdomen since June 2025.  He developed more severe pain in the same spot last night after a spicy meal.  The pain last night was much more severe and persistent, keeping him from sleep last night.  He also had numerous episodes of nonbloody vomiting associated with this.  He denies subjective fever or chills.   ED Course: Upon arrival to the ED, patient is found to be afebrile and saturating well on room air with normal HR and stable BP.  Labs are most notable for normal creatinine, normal LFTs, normal lipase, and normal WBC.  CT demonstrates cholelithiasis with mild pericholecystic inflammatory stranding and 10 mm CBD.   Surgery was consulted by the ED PA and the patient was treated with Zosyn .   Review of Systems:  All other systems reviewed and apart from HPI, are negative.   Hospital Course:   1)Cholelithiasis without definite cholecystitis ---- -CT abdomen and pelvis from 08/20/2024 and right upper quadrant ultrasound from 07/21/2024 noted -Suggestion of possible age-related ectasia of the common bile duct please see right upper quadrant ultrasound dated 08/21/2024--- - Not septic on admission; LFTs and lipase are normal  - Tolerating oral intake well -Initially treated with Zosyn  - No further antibiotics needed -Low-fat diet advised -Outpatient follow-up with general surgeon for possible elective cholecystectomy advised -General Surgery would like to defer surgical procedures at this time given that patient is stable and to allow for cardiology clearance preop   2)H/o CABG/ CAD /valvular heart disease status  post aortic valve replacement with a 23 mm Inspiris bioprosthetic valve (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 Lucas in November 2024.  -He was taken to the OR electively on 06/26/24 for re-do sternotomy.  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 This required aortic root replacement with a 21mm Konect bioprosthetic valved conduit, tricuspid valve replacement with a 27mm Mosaic  bioprosthetic valve, and single-vessel coronary artery bypass grafting (SVG->distal RCA). --Bentall Procedure using a 21 mm KONECT RESILIA pericardial valved conduit. Reimplantation of left coronary artery and ligation of right coronary artery. --   CABG x 1 using SVG to RCA. -Tricuspid valve replacement using a 27 mm Medtronic Mosaic porcine valve. --- Patient to discuss with his cardiology team effusion continue aspirin  along with Eliquis  -- Continue Crestor   3. Atrial fibrillation and flutter --H/o CVA x 2 (late 90's and 2007) a - Continue amiodarone  for rate control, and  Eliquis  for secondary stroke prophylaxis   4. Chronic HFpEF  - Appears compensated   - Continue PTA Lasix    5. Hypertension  - Decrease amlodipine  to 2.5 mg daily due to soft BP   Discharge Condition: stable  Follow UP   Follow-up Information     Espinoza, Alejandra, DO. Schedule an appointment as soon as possible for a visit.   Specialty: Family Medicine Why: As needed Contact information: [address] [place] [address] [phone]         Kallie Manuelita BROCKS, MD. Schedule an appointment as soon as possible for a visit.   Specialty: General  Surgery Why: gall stones Contact information: [address] [place] [facility] [address] [phone]                  Consults obtained -General Surgery  Diet and Activity recommendation:  As advised  Discharge Instructions   Discharge Instructions     Call MD for:  persistant dizziness or light-headedness   Complete by: As directed    Call MD for:  persistant nausea and vomiting   Complete by: As directed    Call MD for:  severe uncontrolled pain   Complete by: As directed    Call MD for:  temperature >100.4   Complete by: As directed    Diet - low sodium heart healthy   Complete by: As directed    Low-fat diet advised   Discharge instructions   Complete by: As directed    1)Avoid ibuprofen/Advil/Aleve/Motrin/Goody Powders/Naproxen/BC powders/Meloxicam/Diclofenac/Indomethacin and other Nonsteroidal anti-inflammatory medications as these will make you more likely to bleed and can cause stomach ulcers, can also cause Kidney problems.   2)Watch for bleeding while on Blood Thinners--watch for blood in your stool which can make your stool black, maroon, mahogany or red---, blood in your urine which can make your urine pink or red, nosebleeds , also watch for possible bruising -You are taking Apixaban /Eliquis --- which is a blood thinner--- be careful to avoid injury or falls  3) You will need surgery for your gallbladder at some point in the future, however surgery may have to be done sooner rather than later if you develop persistent right-sided abdominal pain, or fevers or chills or persistent vomiting-  4) please ask your cardiologist if you should restart your aspirin  therapy, you are taking Eliquis /apixaban  which is also a blood thinner  5)Low Fat Diet advised  -On a low-fat diet, you can eat plenty of fruits, vegetables, and whole grains like oats, brown rice, and quinoa. Lean protein sources such as chicken breast, [place] breast, fish, beans, and egg whites are  excellent choices. Also, include low-fat dairy products like skim milk, low-fat yogurt, and cottage cheese. Small amounts of healthy fats from sources like olive oil and avocado can be used in moderation  Guidelines: Limit fat intake to 30% or less of total daily calories.  Choose lean protein sources such as chicken, fish,  and beans.  Select low-fat dairy products like skim milk, yogurt, and cheese. Consume plenty of fruits, vegetables, and whole grains.  Avoid processed foods, fried foods, and foods high in saturated and trans fats.   Recommended Foods: Fruits and vegetables: apples, bananas, berries, carrots, spinach Lean protein: chicken, fish, [place], eggs Whole grains: brown rice, quinoa, oats, whole wheat bread Low-fat dairy: skim milk, yogurt, cottage cheese Healthy fats: olive oil, avocado, nuts (in moderation)  Ensure you're getting enough essential fatty acids, such as omega-3s and omega-6s.   Sample Meal Plan:  Breakfast: Oatmeal with berries and nuts Lunch: Salad with grilled chicken and low-fat dressing Dinner: Baked salmon with roasted vegetables Snack: Greek yogurt with fruit  Remember, a low-fat diet should be sustainable and enjoyable. It's important to make gradual changes and find healthy food options that fit your lifestyle   Increase activity slowly   Complete by: As directed          Discharge Medications     Allergies as of 08/21/2024       Reactions   Tetanus Toxoids Other (See Comments)   Childhood Allergy    Zetia  [ezetimibe ] Diarrhea        Medication List     STOP taking these medications    docusate sodium  100 MG capsule Commonly known as: COLACE       TAKE these medications    amiodarone  200 MG tablet Commonly known as: PACERONE  Take 1 tablet (200 mg total) by mouth daily.   amLODipine  5 MG tablet Commonly known as: NORVASC  Take 0.5 tablets (2.5 mg total) by mouth daily. What changed: how much to take   apixaban  5 MG Tabs  tablet Commonly known as: ELIQUIS  Take 1 tablet (5 mg total) by mouth 2 (two) times daily.   aspirin  81 MG chewable tablet Chew 1 tablet (81 mg total) by mouth daily. Swallow whole.   finasteride  5 MG tablet Commonly known as: PROSCAR  Take 5 mg by mouth every evening.   furosemide  40 MG tablet Commonly known as: LASIX  Take 1 tablet (40 mg total) by mouth daily.   nitroGLYCERIN  0.4 MG SL tablet Commonly known as: NITROSTAT  Place 1 tablet (0.4 mg total) under the tongue every 5 (five) minutes as needed for chest pain.   OxyCONTIN  10 mg 12 hr tablet Generic drug: oxyCODONE  Take 10 mg by mouth every 12 (twelve) hours as needed (pain).   pantoprazole  40 MG tablet Commonly known as: Protonix  Take 1 tablet (40 mg total) by mouth daily.   potassium chloride  SA 20 MEQ tablet Commonly known as: KLOR-CON  M Take 1 tablet (20 mEq total) by mouth daily. X 7 days, then change to only days you take a Lasix  What changed: additional instructions   rosuvastatin  10 MG tablet Commonly known as: CRESTOR  Take 1 tablet (10 mg total) by mouth daily.   senna-docusate 8.6-50 MG tablet Commonly known as: Senokot-S Take 2 tablets by mouth at bedtime.        Major procedures and Radiology Reports - PLEASE review detailed and final reports for all details, in brief -    US  Abdomen Limited RUQ (LIVER/GB) Result Date: 08/21/2024 CLINICAL DATA:  6194 Acute cholecystitis [id number] Dilation of common bile duct [id number] EXAM: ULTRASOUND ABDOMEN LIMITED RIGHT UPPER QUADRANT COMPARISON:  August 20, 2024, October 10, 2023 FINDINGS: Gallbladder: Multiple small layering gallstones. No wall thickening or pericholecystic fluid. No sonographic Murphy's sign noted by sonographer. Common bile duct: Diameter: 9 mm. On the comparison CT,  there is smooth distal tapering to the level of the ampulla. Liver: Mildly increased echogenicity. Lobular cyst in the right hepatic lobe again noted. No intrahepatic biliary  ductal dilation. Portal vein is patent on color Doppler imaging with normal direction of blood flow towards the liver. Right Kidney: Partially visualized. Upper pole cyst measuring 6.1 cm. No hydronephrosis or nephrolithiasis. Other: None. IMPRESSION: 1. Cholecystolithiasis. No changes of acute cholecystitis. 2. The common bile duct in the porta hepatis is dilated measuring 9 mm. On the comparison CT, there is smooth distal tapering to the level of the ampulla, which is unchanged over multiple prior studies. In the absence of elevated serum bilirubin, this may represent age-related ectasia of the common bile duct. Electronically Signed   By: Rogelia Myers M.D.   On: 08/21/2024 09:44   CT ABDOMEN PELVIS W CONTRAST Result Date: 08/20/2024 CLINICAL DATA:  Acute abdominal pain, right-sided. History of prostate surgery. EXAM: CT ABDOMEN AND PELVIS WITH CONTRAST TECHNIQUE: Multidetector CT imaging of the abdomen and pelvis was performed using the standard protocol following bolus administration of intravenous contrast. RADIATION DOSE REDUCTION: This exam was performed according to the departmental dose-optimization program which includes automated exposure control, adjustment of the mA and/or kV according to patient size and/or use of iterative reconstruction technique. CONTRAST:  OMNIPAQUE  IOHEXOL  300 MG/ML  SOLN COMPARISON:  CT chest abdomen and pelvis 10/10/2023. FINDINGS: Lower chest: There is a trace right pleural effusion. There is a small amount of airspace disease in the right lower lobe. Hepatobiliary: Multiple gallstones are present. There is mild pericholecystic inflammatory stranding. The common bile duct is dilated measuring 1 cm. There is fatty infiltration of the liver with some focal fatty sparing along the falciform ligament. There is a cyst in the right lobe of the liver measuring 17 mm. Smaller cysts are also identified in the left lobe. Pancreas: Unremarkable. No pancreatic ductal dilatation  or surrounding inflammatory changes. Spleen: Normal in size without focal abnormality. Calcified granulomas are present. Adrenals/Urinary Tract: There are multiple hypodensities in both kidneys which are too small to characterize, favored as cysts. There is a complex cyst in the right kidney with thin peripheral calcifications measuring 6.2 cm, unchanged. There is no hydronephrosis or perinephric fluid. Adrenal glands are within normal limits. There is diffuse bladder wall thickening. Stomach/Bowel: Stomach is within normal limits. Appendix appears normal. No evidence of bowel wall thickening, distention, or inflammatory changes. There is sigmoid colon diverticulosis. Vascular/Lymphatic: Aortic atherosclerosis. No enlarged abdominal or pelvic lymph nodes. Reproductive: Prostate gland is now smaller absent, a new finding. Other: No abdominal wall hernia or abnormality. No abdominopelvic ascites. Musculoskeletal: Degenerative changes affect the spine. IMPRESSION: 1. Cholelithiasis with mild pericholecystic inflammatory stranding. Findings are concerning for acute cholecystitis. 2. Dilated common bile duct measuring 1 cm. Correlate clinically for obstruction. 3. Trace right pleural effusion with right lower lobe airspace disease. 4. Diffuse bladder wall thickening worrisome for cystitis. 5. Colonic diverticulosis. 6. Aortic atherosclerosis. Aortic Atherosclerosis (ICD10-I70.0). Electronically Signed   By: Greig Pique M.D.   On: 08/20/2024 18:33   ECHOCARDIOGRAM COMPLETE Result Date: 08/01/2024    ECHOCARDIOGRAM REPORT   Patient Name:   TREYVEON MOCHIZUKI Date of Exam: 08/01/2024 Medical Rec #:  [id number]  Height:       62.0 in Accession #:    [id number] Weight:       157.6 lb Date of Birth:  [birth year]   BSA:          1.728 m Patient Age:  78 years   BP:           126/77 mmHg Patient Gender: M          HR:           70 bpm. Exam Location:  [facility] Procedure: 2D Echo, 3D Echo, Cardiac Doppler, Color Doppler and Strain  Analysis            (Both Spectral and Color Flow Doppler were utilized during            procedure). Indications:     I36.8 Other nonrheumatic tricuspid valve disorders; I35.8 Other                  nonrheumatic aortic valve disorders. Order states To be read                  by Dr. Wendel  History:         Patient has prior history of Echocardiogram examinations, most                  recent 06/18/2024. CHF, Previous Myocardial Infarction and CAD,                  Abnormal ECG and Prior CABG, Stroke, Aortic Valve Disease,                  Endocarditis and Tricuspid valve replacement, Arrythmias:RBBB,                  Signs/Symptoms:Chest Pain; Risk Factors:Hypertension. AVR.                  Aortic Valve: 21 mm KONECT RESILIA pericardial Valved Graft                  valve is present in the aortic position. Procedure Date:                  06/26/2024.  Sonographer:     Ellouise Mose RDCS Referring Phys:  [id number] ARUN K [facility] Diagnosing Phys: Jayson Sierras MD IMPRESSIONS  1. 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 Mean TV gradient 4 mmHg. Trivial tricuspid regurgitation with grossly normal prosthetic function. The tricuspid valve is has been repaired/replaced.  2. The aortic valve has been repaired/replaced. Aortic valve regurgitation is not visualized. There is a 21 mm KONECT RESILIA pericardial valve graft present in the aortic position. Procedure Date: 06/26/2024. Mean AV gradient 7.7 mmHg suggesting normal function. Dimentionless index 0.68.  3. Left pleural effusion present, likely at least moderate based on available images.  4. Right ventricular systolic function is low normal. The right ventricular size is normal. Tricuspid regurgitation signal is inadequate for assessing PA pressure. Device wire present.  5. The mitral valve is degenerative. Moderate mitral valve regurgitation. The mean mitral valve gradient is 4.0 mmHg. Moderate mitral annular calcification.  6. Aortic dilatation  noted. There is borderline dilatation of the aortic root, measuring 40 mm.  7. Left ventricular ejection fraction, by estimation, is 60 to 65%. Left ventricular ejection fraction by 3D volume is 56 %. The left ventricle has normal function. The left ventricle demonstrates regional wall motion abnormalities (see scoring diagram/findings for description). Left ventricular diastolic parameters are indeterminate.  8. The inferior vena cava is dilated in size with <50% respiratory variability, suggesting right atrial pressure of 15 mmHg. FINDINGS  Left Ventricle: Left ventricular ejection fraction, by estimation, is 60 to 65%. Left ventricular ejection fraction by 3D volume is  56 %. The left ventricle has normal function. The left ventricle demonstrates regional wall motion abnormalities. The left ventricular internal cavity size was normal in size. There is borderline concentric left ventricular hypertrophy. Abnormal (paradoxical) septal motion, consistent with RV pacemaker. Left ventricular diastolic parameters are indeterminate.  LV Wall Scoring: The basal inferior segment is akinetic. The entire anterior wall, entire lateral wall, entire septum, entire apex, and mid and distal inferior wall are normal. Right Ventricle: The right ventricular size is normal. No increase in right ventricular wall thickness. Right ventricular systolic function is low normal. Tricuspid regurgitation signal is inadequate for assessing PA pressure. Left Atrium: Left atrial size was normal in size. Right Atrium: Right atrial size was normal in size. Pericardium: Left pleural effusion present, likely at least moderate based on available images. There is no evidence of pericardial effusion. Mitral Valve: The mitral valve is degenerative in appearance. There is mild thickening of the mitral valve leaflet(s). Moderate mitral annular calcification. Moderate mitral valve regurgitation. MV peak gradient, 11.6 mmHg. The mean mitral valve gradient   is 4.0 mmHg. 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 Mean TV gradient 4 mmHg. Trivial tricuspid regurgitation with grossly normal prosthetic function. The tricuspid valve is has been repaired/replaced. Tricuspid valve regurgitation is trivial. 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 Aortic valve regurgitation is not visualized. Aortic valve mean gradient measures 7.7 mmHg. Aortic valve peak gradient measures 14.7 mmHg. Aortic valve area, by VTI measures 3.87 cm. There is a 21 mm KONECT RESILIA pericardial Valved Graft valve present in the aortic position. Procedure Date: 06/26/2024. Pulmonic Valve: The pulmonic valve was not well visualized. Pulmonic valve regurgitation is not visualized. Aorta: Aortic dilatation noted. There is borderline dilatation of the aortic root, measuring 40 mm. Venous: The inferior vena cava is dilated in size with less than 50% respiratory variability, suggesting right atrial pressure of 15 mmHg. IAS/Shunts: No atrial level shunt detected by color flow Doppler. Additional Comments: 3D was performed not requiring image post processing on an independent workstation and was normal.  LEFT VENTRICLE PLAX 2D LVIDd:         5.30 cm         Diastology LVIDs:         3.80 cm         LV e' medial:    4.90 cm/s LV PW:         1.20 cm         LV E/e' medial:  27.3 LV IVS:        0.90 cm         LV e' lateral:   10.60 cm/s LVOT diam:     2.70 cm         LV E/e' lateral: 12.6 LV SV:         145 LV SV Index:   84 LVOT Area:     5.73 cm        3D Volume EF                                LV 3D EF:    Left                                             ventricul LV Volumes (MOD)  ar LV vol d, MOD    134.5 ml                   ejection A2C:                                        fraction LV vol d, MOD    144.0 ml                   by 3D A4C:                                        volume is LV vol s, MOD     66.6 ml                    56 %. A2C: LV vol s, MOD    53.2 ml A4C:                           3D Volume EF: LV SV MOD A2C:   67.9 ml       3D EF:        56 % LV SV MOD A4C:   144.0 ml      LV EDV:       184 ml LV SV MOD BP:    81.0 ml       LV ESV:       81 ml                                LV SV:        103 ml RIGHT VENTRICLE            IVC RV S prime:     6.31 cm/s  IVC diam: 2.30 cm TAPSE (M-mode): 0.6 cm LEFT ATRIUM             Index        RIGHT ATRIUM           Index LA diam:        3.50 cm 2.03 cm/m   RA Area:     17.00 cm LA Vol (A2C):   64.9 ml 37.57 ml/m  RA Volume:   43.90 ml  25.41 ml/m LA Vol (A4C):   34.4 ml 19.91 ml/m LA Biplane Vol: 46.2 ml 26.74 ml/m  AORTIC VALVE AV Area (Vmax):    4.17 cm AV Area (Vmean):   4.03 cm AV Area (VTI):     3.87 cm AV Vmax:           192.00 cm/s AV Vmean:          128.333 cm/s AV VTI:            0.374 m AV Peak Grad:      14.7 mmHg AV Mean Grad:      7.7 mmHg LVOT Vmax:         140.00 cm/s LVOT Vmean:        90.300 cm/s LVOT VTI:          0.253 m LVOT/AV VTI ratio: 0.68  AORTA Ao Root diam: 4.00 cm Ao Asc diam:  2.70 cm MITRAL VALVE  TRICUSPID VALVE MV Area (PHT): 3.71 cm       TV Peak grad:   8.8 mmHg MV Area VTI:   3.99 cm       TV Mean grad:   4.0 mmHg MV Peak grad:  11.6 mmHg      TV Vmax:        1.48 m/s MV Mean grad:  4.0 mmHg       TV Vmean:       93.6 cm/s MV Vmax:       1.70 m/s       TV VTI:         0.49 msec MV Vmean:      88.3 cm/s MV Decel Time: 205 msec       SHUNTS MR Peak grad:    109.0 mmHg   Systemic VTI:  0.25 m MR Mean grad:    65.0 mmHg    Systemic Diam: 2.70 cm MR Vmax:         522.00 cm/s MR Vmean:        369.0 cm/s MR PISA:         1.57 cm MR PISA Eff ROA: 12 mm MR PISA Radius:  0.50 cm MV E velocity: 134.00 cm/s MV A velocity: 54.60 cm/s MV E/A ratio:  2.45 Jayson Sierras MD Electronically signed by Jayson Sierras MD Signature Date/Time: 08/01/2024/11:09:26 AM    Final     Today   Subjective    Kyle Matthews  today has no new complaints No fever  Or chills   No Nausea, Vomiting or Diarrhea  - Tolerating oral intake well -No frank chest pains or ACS type concern           Patient has been seen and examined prior to discharge   Objective   Blood pressure 124/75, pulse 70, temperature 98.3 F (36.8 C), temperature source Oral, resp. rate 20, height 5' 2 (1.575 m), weight 66.4 kg, SpO2 97%.   Intake/Output Summary (Last 24 hours) at 08/21/2024 1616 Last data filed at 08/21/2024 0944 Gross per 24 hour  Intake 44.78 ml  Output 1025 ml  Net -980.22 ml    Exam Gen:- Awake Alert, no acute distress  HEENT:- [place].AT, No sclera icterus Neck-Supple Neck,No JVD,.  Lungs-  CTAB , good air movement bilaterally CV- S1, S2 normal, regular, sternotomy scar Abd-  +ve B.Sounds, Abd Soft, No tenderness,    Extremity/Skin:- No  edema,   good pulses Psych-affect is appropriate, oriented x3 Neuro-no new focal deficits, no tremors    Data Review   CBC w Diff:  Lab Results  Component Value Date   WBC 6.9 08/21/2024   HGB 11.3 (L) 08/21/2024   HCT 36.9 (L) 08/21/2024   PLT 177 08/21/2024   LYMPHOPCT 7 08/20/2024   MONOPCT 5 08/20/2024   EOSPCT 0 08/20/2024   BASOPCT 0 08/20/2024   CMP:  Lab Results  Component Value Date   NA 139 08/21/2024   K 3.5 08/21/2024   CL 107 08/21/2024   CO2 24 08/21/2024   BUN 13 08/21/2024   CREATININE 0.76 08/21/2024   PROT 7.2 08/21/2024   PROT 6.6 01/29/2024   ALBUMIN  3.6 08/21/2024   ALBUMIN  4.1 01/29/2024   BILITOT 0.5 08/21/2024   BILITOT <0.2 01/29/2024   ALKPHOS 74 08/21/2024   AST 19 08/21/2024   ALT 19 08/21/2024   Total Discharge time is about 33 minutes  Rendall Carwin M.D on 08/21/2024 at 4:16 PM  Go to [email or web address] -  for contact info  [facility] - Office  [phone]

## 2024-08-21 NOTE — Care Management Obs Status (Signed)
 MEDICARE OBSERVATION STATUS NOTIFICATION   Patient Details  Name: Kyle Matthews MRN: [id number] Date of Birth: [birth year]   Medicare Observation Status Notification Given:  Yes    Lucie Lunger, LCSWA 08/21/2024, 12:16 PM

## 2024-08-21 NOTE — Progress Notes (Signed)
 Mobility Specialist Progress Note:    08/21/24 1355  Mobility  Activity Ambulated with assistance  Level of Assistance Modified independent, requires aide device or extra time  Assistive Device None  Distance Ambulated (ft) 10 ft  Range of Motion/Exercises Active;All extremities  Activity Response Tolerated well  Mobility Referral Yes  Mobility visit 1 Mobility  Mobility Specialist Start Time (ACUTE ONLY) 1355  Mobility Specialist Stop Time (ACUTE ONLY) 1408  Mobility Specialist Time Calculation (min) (ACUTE ONLY) 13 min   Pt received sitting EOB, wife in room. NT requested assistance with d/c, modI to stand and ambulate with no AD. Tolerated well,asx throughout. Left with wife in car, all needs met.  Lisa-Marie Rueger Mobility Specialist Please contact via Special educational needs teacher or  Rehab office at [phone]

## 2024-08-21 NOTE — Consult Note (Addendum)
 I was present with the medical student for this service. I personally verified the history of present illness, performed the physical exam, and made the plan for this encounter. I have verified the medical student's documentation and made modifications where appropriately. I have personally documented in my own words a brief history, physical, and plan below.     Patient with RUQ pain that brought him to the ED after [employer]. He had a CT with stones and concern for possible cholecystitis. He was admitted overnight. The US  does not demonstrated cholecystitis and CBD is large but no stones and smooth tapering, chronic in nature and likely related to his age.   Personally reviewed and showed patient his CT scan and the stones in the gallbladder.   Diet for now Cardiology consult for risk stratification and anticoagulation, best timing for surgery and location given his Cardiac procedures in July  Zosyn  stopped for now I think he will ultimately require cholecystectomy as he is having symptomatic biliary colic but will need to determine the best plan for when and where.   Manuelita Pander, MD [facility] [address] [address], [place] [address] [phone] (office)   Reason for Consult: Possible acute cholecystitis  Referring Physician: Madelin Gentry  Kyle Matthews is an 78 y.o. male.  HPI: Kyle Matthews is a 78 y/o male with a complex cardiac and surgical history, including recent Bentall procedure, tricuspid valve replacement, CABG x1 (06/2024), and prior CABG x2 and AVR in 10/2023, who presents with acute right-sided abdominal pain, localized to the RUQ and RLQ. He reports the pain has been intermittent since June but thought it was in relation to his recent prostate surgery. Patient states the pain acutely worsened after eating a spicy meal for dinner on Monday and had multiple bouts of vomiting through the night prompting his ED presentation. Patient reports he  feels constipated but is currently afebrile, denies flank pain, chest pain, SOB, or diarrhea. Of note, patient reports his last dose of Eliquis  was on Monday.     Past Medical History:  Diagnosis Date   Anemia    Anxiety    Arthritis    BPH (benign prostatic hyperplasia)    Status post biopsy in 2009. - w/ LUTS   Coronary artery disease    a. s/p CABG in 10/2023 with LIMA-LAD and SVG-Ramus at the time of AVR   Depression    Essential hypertension    Hyperlipidemia    On Crestor  - followed by PCP   Moderate calcific aortic stenosis 04/2017   a. s/p AVR with 23 mm Edwards Inspiris Resilia pericardial valve in 10/2023   Stroke (HCC) 2007   No true etiology noted   UTI (urinary tract infection)     Past Surgical History:  Procedure Laterality Date   AORTIC VALVE REPLACEMENT N/A 11/02/2023   Procedure: AORTIC VALVE REPLACEMENT (AVR) USING 23 MM INSPIRIS RESILIA AORTIC VALVE;  Surgeon: Lucas Dorise POUR, MD;  Location: [facility];  Service: Open Heart Surgery;  Laterality: N/A;   BENTALL PROCEDURE N/A 06/26/2024   Procedure: BENTALL PROCEDURE USING KONECT AORTIC VALVE CONDUIT SIZE ;  Surgeon: Lucas Dorise POUR, MD;  Location: [facility] OR;  Service: Open Heart Surgery;  Laterality: N/A;   CORONARY ARTERY BYPASS GRAFT N/A 11/02/2023   Procedure: CORONARY ARTERY BYPASS GRAFTING (CABG) X TWO USING LEFT INTERNAL MAMMARY ARTERY AND RIGHT GREATER SAPHENEOUS VEIN HARVESTED ENDOSCOPICLY;  Surgeon: Lucas Dorise POUR, MD;  Location[facility];  Service: Open Heart Surgery;  Laterality: N/A;   CORONARY ARTERY BYPASS GRAFT N/A 06/26/2024   Procedure: CORONARY ARTERY BYPASS GRAFTING (CABG) TIMES ONE USING ENDOSCOPICALLY HARVESTED LEFT GREATER SAPHENOUS VEIN;  Surgeon: Lucas Dorise POUR, MD;  [facility];  Service: Open Heart Surgery;  Laterality: N/A;   CORONARY PRESSURE/FFR STUDY N/A 10/09/2023   Procedure: CORONARY PRESSURE/FFR STUDY;  Surgeon: Wendel Lurena POUR, MD;  [facility];  Service:  Cardiovascular;  Laterality: N/A;   IR ANGIOGRAM PELVIS SELECTIVE OR SUPRASELECTIVE  10/27/2023   IR ANGIOGRAM PELVIS SELECTIVE OR SUPRASELECTIVE  10/27/2023   IR ANGIOGRAM SELECTIVE EACH ADDITIONAL VESSEL  10/27/2023   IR ANGIOGRAM SELECTIVE EACH ADDITIONAL VESSEL  10/27/2023   IR ANGIOGRAM SELECTIVE EACH ADDITIONAL VESSEL  10/27/2023   IR ANGIOGRAM SELECTIVE EACH ADDITIONAL VESSEL  10/27/2023   IR EMBO ARTERIAL NOT HEMORR HEMANG INC GUIDE ROADMAPPING  10/27/2023   IR US  GUIDE VASC ACCESS RIGHT  10/27/2023   PACEMAKER IMPLANT N/A 07/01/2024   Procedure: PACEMAKER IMPLANT;  Surgeon: Inocencio Soyla Lunger, MD;  [facility];  Service: Cardiovascular;  Laterality: N/A;   REDO STERNOTOMY N/A 06/26/2024   Procedure: REDO STERNOTOMY;  Surgeon: Lucas Dorise POUR, MD;  [facility];  Service: Open Heart Surgery;  Laterality: N/A;   RIGHT HEART CATH AND CORONARY ANGIOGRAPHY N/A 06/20/2024   Procedure: RIGHT HEART CATH AND CORONARY ANGIOGRAPHY;  Surgeon: Ladona Heinz, MD;  [facility];  Service: Cardiovascular;  Laterality: N/A;   RIGHT/LEFT HEART CATH AND CORONARY ANGIOGRAPHY N/A 10/09/2023   Procedure: RIGHT/LEFT HEART CATH AND CORONARY ANGIOGRAPHY;  Surgeon: Wendel Lurena POUR, MD;  [facility];  Service: Cardiovascular;  Laterality: N/A;   TEE WITHOUT CARDIOVERSION N/A 11/02/2023   Procedure: TRANSESOPHAGEAL ECHOCARDIOGRAM;  Surgeon: Lucas Dorise POUR, MD;  Location: [facility] OR;  Service: Open Heart Surgery;  Laterality: N/A;   TEE WITHOUT CARDIOVERSION N/A 06/26/2024   Procedure: ECHOCARDIOGRAM, TRANSESOPHAGEAL;  Surgeon: Lucas Dorise POUR, MD;  [facility];  Service: Open Heart Surgery;  Laterality: N/A;   TRANSESOPHAGEAL ECHOCARDIOGRAM (CATH LAB) N/A 06/18/2024   Procedure: TRANSESOPHAGEAL ECHOCARDIOGRAM;  Surgeon: Jeffrie Oneil BROCKS, MD;  [facility];  Service: Cardiovascular;  Laterality: N/A;   TRANSTHORACIC ECHOCARDIOGRAM  04/28/2017    EF 65-70%. GR 1  DD. No regional wall motion abnormality. Moderate aortic stenosis with moderate regurgitation. Mean gradient 34 mmHg.   TRICUSPID VALVE REPLACEMENT N/A 06/26/2024   Procedure: TRICUSPID VALVE REPLACEMENT USING MOSIAC BRIOPROSTHESIS VALVE SIZE ;  Surgeon: Lucas Dorise POUR, MD;  Location: [facility] OR;  Service: Open Heart Surgery;  Laterality: N/A;   XI ROBOTIC ASSISTED SIMPLE PROSTATECTOMY N/A 05/22/2024   Procedure: PROSTATECTOMY, SIMPLE, ROBOT-ASSISTED;  Surgeon: Alvaro Ricardo KATHEE Mickey., MD;  Location: [facility] ORS;  Service: Urology;  Laterality: N/A;    Family History  Problem Relation Age of Onset   Coronary artery disease Mother 92       55. Was diagnosed with a blood clot - suspect that this was a STEMI   Lung cancer Father    Thyroid  disease Sister    Healthy Brother 57   Pancreatic cancer Sister 26   Cancer Maternal Grandmother    Heart disease Maternal Grandfather 49   Cancer Paternal Grandmother    Stroke Paternal Grandfather     Social History:  reports that he quit smoking about a year ago. His smoking use included cigarettes. He has a 36 pack-year smoking history. He has been exposed to tobacco  smoke. He has quit using smokeless tobacco. He reports that he does not currently use alcohol after a past usage of about 6.0 standard drinks of alcohol per week. He reports that he does not use drugs.  Allergies:  Allergies  Allergen Reactions   Tetanus Toxoids Other (See Comments)    Childhood Allergy    Zetia  [Ezetimibe ] Diarrhea    Medications: I have reviewed the patient's current medications. Medications Prior to Admission  Medication Sig Dispense Refill Last Dose/Taking   amiodarone  (PACERONE ) 200 MG tablet Take 1 tablet (200 mg total) by mouth daily. 90 tablet 1 08/19/2024 Morning   amLODipine  (NORVASC ) 5 MG tablet Take 1 tablet (5 mg total) by mouth daily. 30 tablet 3 08/19/2024 Morning   apixaban  (ELIQUIS ) 5 MG TABS tablet Take 1 tablet (5 mg total) by mouth 2 (two) times daily. 60  tablet 1 08/19/2024 at 10:00 PM   aspirin  81 MG chewable tablet Chew 81 mg by mouth daily. Swallow whole.   08/19/2024 Morning   docusate sodium  (COLACE) 100 MG capsule Take 1 capsule (100 mg total) by mouth 2 (two) times daily. (Patient taking differently: Take 100 mg by mouth daily.)   08/19/2024 Morning   finasteride  (PROSCAR ) 5 MG tablet Take 5 mg by mouth every evening.   08/19/2024 Bedtime   furosemide  (LASIX ) 40 MG tablet Take 1 tablet (40 mg total) by mouth daily. 7 tablet 0 08/19/2024 Morning   nitroGLYCERIN  (NITROSTAT ) 0.4 MG SL tablet Place 1 tablet (0.4 mg total) under the tongue every 5 (five) minutes as needed for chest pain. 25 tablet 3 Unknown   oxyCODONE  (OXYCONTIN ) 10 mg 12 hr tablet Take 10 mg by mouth every 12 (twelve) hours as needed (pain).   08/19/2024 Bedtime   potassium chloride  SA (KLOR-CON  M) 20 MEQ tablet Take 1 tablet (20 mEq total) by mouth daily. X 7 days, then change to only days you take a Lasix  (Patient taking differently: Take 20 mEq by mouth daily.) 30 tablet 1 08/19/2024 Morning   rosuvastatin  (CRESTOR ) 10 MG tablet Take 1 tablet (10 mg total) by mouth daily. 30 tablet 3 08/19/2024 Morning    Results for orders placed or performed during the hospital encounter of 08/20/24 (from the past 48 hours)  Urinalysis, Routine w reflex microscopic -Urine, Clean Catch     Status: Abnormal   Collection Time: 08/20/24 12:00 PM  Result Value Ref Range   Color, Urine YELLOW YELLOW   APPearance CLEAR CLEAR   Specific Gravity, Urine 1.017 1.005 - 1.030   pH 6.0 5.0 - 8.0   Glucose, UA NEGATIVE NEGATIVE mg/dL   Hgb urine dipstick SMALL (A) NEGATIVE   Bilirubin Urine NEGATIVE NEGATIVE   Ketones, ur NEGATIVE NEGATIVE mg/dL   Protein, ur 899 (A) NEGATIVE mg/dL   Nitrite NEGATIVE NEGATIVE   Leukocytes,Ua NEGATIVE NEGATIVE   RBC / HPF 0-5 0 - 5 RBC/hpf   WBC, UA 6-10 0 - 5 WBC/hpf   Bacteria, UA NONE SEEN NONE SEEN   Squamous Epithelial / HPF 0-5 0 - 5 /HPF   Mucus PRESENT     Comment:  Performed at [facility], [address], [place], [place] [address]  Lipase, blood     Status: None   Collection Time: 08/20/24 12:01 PM  Result Value Ref Range   Lipase 28 11 - 51 U/L    Comment: Performed at [facility], [address], [place], [place] [address]  Comprehensive metabolic panel     Status: Abnormal  Collection Time: 08/20/24 12:01 PM  Result Value Ref Range   Sodium 138 135 - 145 mmol/L   Potassium 4.0 3.5 - 5.1 mmol/L   Chloride 101 98 - 111 mmol/L   CO2 21 (L) 22 - 32 mmol/L   Glucose, Bld 133 (H) 70 - 99 mg/dL    Comment: Glucose reference range applies only to samples taken after fasting for at least 8 hours.   BUN 18 8 - 23 mg/dL   Creatinine, Ser 9.13 0.61 - 1.24 mg/dL   Calcium  9.4 8.9 - 10.3 mg/dL   Total Protein 8.2 (H) 6.5 - 8.1 g/dL   Albumin  3.9 3.5 - 5.0 g/dL   AST 26 15 - 41 U/L   ALT 22 0 - 44 U/L   Alkaline Phosphatase 85 38 - 126 U/L   Total Bilirubin 0.6 0.0 - 1.2 mg/dL   GFR, Estimated >39 >39 mL/min    Comment: (NOTE) Calculated using the CKD-EPI Creatinine Equation (2021)    Anion gap 16 (H) 5 - 15    Comment: Performed at [facility], [address] [place], [place] [address]  CBC with Differential     Status: Abnormal   Collection Time: 08/20/24 12:01 PM  Result Value Ref Range   WBC 9.2 4.0 - 10.5 K/uL   RBC 4.24 4.22 - 5.81 MIL/uL   Hemoglobin 11.9 (L) 13.0 - 17.0 g/dL   HCT 61.4 (L) 60.9 - 47.9 %   MCV 90.8 80.0 - 100.0 fL   MCH 28.1 26.0 - 34.0 pg   MCHC 30.9 30.0 - 36.0 g/dL   RDW 84.0 (H) 88.4 - 84.4 %   Platelets 181 150 - 400 K/uL   nRBC 0.0 0.0 - 0.2 %   Neutrophils Relative % 87 %   Neutro Abs 7.9 (H) 1.7 - 7.7 K/uL   Lymphocytes Relative 7 %   Lymphs Abs 0.7 0.7 - 4.0 K/uL   Monocytes Relative 5 %   Monocytes Absolute 0.5 0.1 - 1.0 K/uL   Eosinophils Relative 0 %   Eosinophils Absolute 0.0 0.0 - 0.5 K/uL   Basophils Relative 0 %   Basophils Absolute 0.0 0.0 - 0.1 K/uL   Immature Granulocytes 1 %   Abs  Immature Granulocytes 0.05 0.00 - 0.07 K/uL    Comment: Performed at [facility], [address] [place], [place] [address]  Lipase, blood     Status: None   Collection Time: 08/21/24  4:23 AM  Result Value Ref Range   Lipase 26 11 - 51 U/L    Comment: Performed at [facility], [address], [place], [place] [address]  Comprehensive metabolic panel     Status: None   Collection Time: 08/21/24  4:23 AM  Result Value Ref Range   Sodium 139 135 - 145 mmol/L   Potassium 3.5 3.5 - 5.1 mmol/L   Chloride 107 98 - 111 mmol/L   CO2 24 22 - 32 mmol/L   Glucose, Bld 90 70 - 99 mg/dL    Comment: Glucose reference range applies only to samples taken after fasting for at least 8 hours.   BUN 13 8 - 23 mg/dL   Creatinine, Ser 9.23 0.61 - 1.24 mg/dL   Calcium  9.1 8.9 - 10.3 mg/dL   Total Protein 7.2 6.5 - 8.1 g/dL   Albumin  3.6 3.5 - 5.0 g/dL   AST 19 15 - 41 U/L   ALT 19 0 - 44 U/L   Alkaline Phosphatase 74 38 -  126 U/L   Total Bilirubin 0.5 0.0 - 1.2 mg/dL   GFR, Estimated >39 >39 mL/min    Comment: (NOTE) Calculated using the CKD-EPI Creatinine Equation (2021)    Anion gap 8 5 - 15    Comment: Performed at [facility], [address] [place], [place] [address]  Magnesium      Status: None   Collection Time: 08/21/24  4:23 AM  Result Value Ref Range   Magnesium  2.3 1.7 - 2.4 mg/dL    Comment: Performed at [facility], [address] [place], [place] [address]  CBC     Status: Abnormal   Collection Time: 08/21/24  4:23 AM  Result Value Ref Range   WBC 6.9 4.0 - 10.5 K/uL   RBC 4.06 (L) 4.22 - 5.81 MIL/uL   Hemoglobin 11.3 (L) 13.0 - 17.0 g/dL   HCT 63.0 (L) 60.9 - 47.9 %   MCV 90.9 80.0 - 100.0 fL   MCH 27.8 26.0 - 34.0 pg   MCHC 30.6 30.0 - 36.0 g/dL   RDW 83.9 (H) 88.4 - 84.4 %   Platelets 177 150 - 400 K/uL   nRBC 0.0 0.0 - 0.2 %    Comment: Performed at [facility], [address] [place], [place] [address]    CT ABDOMEN PELVIS W CONTRAST Result Date: 08/20/2024 CLINICAL  DATA:  Acute abdominal pain, right-sided. History of prostate surgery. EXAM: CT ABDOMEN AND PELVIS WITH CONTRAST TECHNIQUE: Multidetector CT imaging of the abdomen and pelvis was performed using the standard protocol following bolus administration of intravenous contrast. RADIATION DOSE REDUCTION: This exam was performed according to the departmental dose-optimization program which includes automated exposure control, adjustment of the mA and/or kV according to patient size and/or use of iterative reconstruction technique. CONTRAST:  OMNIPAQUE  IOHEXOL  300 MG/ML  SOLN COMPARISON:  CT chest abdomen and pelvis 10/10/2023. FINDINGS: Lower chest: There is a trace right pleural effusion. There is a small amount of airspace disease in the right lower lobe. Hepatobiliary: Multiple gallstones are present. There is mild pericholecystic inflammatory stranding. The common bile duct is dilated measuring 1 cm. There is fatty infiltration of the liver with some focal fatty sparing along the falciform ligament. There is a cyst in the right lobe of the liver measuring 17 mm. Smaller cysts are also identified in the left lobe. Pancreas: Unremarkable. No pancreatic ductal dilatation or surrounding inflammatory changes. Spleen: Normal in size without focal abnormality. Calcified granulomas are present. Adrenals/Urinary Tract: There are multiple hypodensities in both kidneys which are too small to characterize, favored as cysts. There is a complex cyst in the right kidney with thin peripheral calcifications measuring 6.2 cm, unchanged. There is no hydronephrosis or perinephric fluid. Adrenal glands are within normal limits. There is diffuse bladder wall thickening. Stomach/Bowel: Stomach is within normal limits. Appendix appears normal. No evidence of bowel wall thickening, distention, or inflammatory changes. There is sigmoid colon diverticulosis. Vascular/Lymphatic: Aortic atherosclerosis. No enlarged abdominal or pelvic lymph  nodes. Reproductive: Prostate gland is now smaller absent, a new finding. Other: No abdominal wall hernia or abnormality. No abdominopelvic ascites. Musculoskeletal: Degenerative changes affect the spine. IMPRESSION: 1. Cholelithiasis with mild pericholecystic inflammatory stranding. Findings are concerning for acute cholecystitis. 2. Dilated common bile duct measuring 1 cm. Correlate clinically for obstruction. 3. Trace right pleural effusion with right lower lobe airspace disease. 4. Diffuse bladder wall thickening worrisome for cystitis. 5. Colonic diverticulosis. 6. Aortic atherosclerosis. Aortic Atherosclerosis (ICD10-I70.0). Electronically Signed   By: Greig Pique  M.D.   On: 08/20/2024 18:33    ROS:  A comprehensive review of systems was negative except for: Gastrointestinal: positive for abdominal pain, constipation, nausea, and vomiting  Blood pressure 124/75, pulse 70, temperature 98.3 F (36.8 C), temperature source Oral, resp. rate 20, height 5' 2 (1.575 m), weight 66.4 kg, SpO2 97%.  Physical Exam HENT:     Head: Normocephalic and atraumatic.     Mouth/Throat:     Mouth: Mucous membranes are moist.     Pharynx: Oropharynx is clear.  Eyes:     Extraocular Movements: Extraocular movements intact.     Pupils: Pupils are equal, round, and reactive to light.  Cardiovascular:     Rate and Rhythm: Normal rate and regular rhythm.  Pulmonary:     Effort: Pulmonary effort is normal.     Breath sounds: Normal breath sounds.  Abdominal:     General: Abdomen is flat. A surgical scar is present. Bowel sounds are normal.     Palpations: Abdomen is soft.     Tenderness: There is abdominal tenderness in the right upper quadrant and right lower quadrant. There is no guarding.  Skin:    General: Skin is warm and dry.  Neurological:     Mental Status: He is alert.      Assessment/Plan: Patient with significant cardiac surgical history, including recent CABG and valve replacements,  presenting with right upper and lower quadrant abdominal pain. CT showed cholelithiasis with mild pericholecystic stranding concerning for acute cholecystitis; however, RUQ ultrasound showed gallstones without sonographic evidence of cholecystitis and stable CBD dilation likely age-related.  Given absence of sonographic cholecystitis and clinical stability, antibiotics can be discontinued. Patient remains tender, suggesting symptomatic biliary colic, and will ultimately require cholecystectomy. Timing of surgery will depend on cardiology input, particularly regarding anticoagulation status and cardiac risk assessment. He has not taken Eliquis  since Monday; risk of holding vs. resuming anticoagulation needs to be addressed before proceeding surgically.  Plan Consult Cardiology: - Consult to assess perioperative cardiac risk. - Evaluate timing and safety of holding or resuming anticoagulation (Eliquis ) in setting of recent CABG/valve surgery.  Surgical Management: - Cholecystectomy indicated for symptomatic biliary colic. - Delay surgery pending cardiology clearance and anticoagulation plan. - Proceed with elective cholecystectomy once deemed safe from cardiac standpoint.  Medical Management: - Discontinue zosyn  (no evidence of acute cholecystitis). - Advance diet as tolerated. - Continue symptom control with analgesia and antiemetics as needed. - Monitor for clinical changes (fever, worsening pain, leukocytosis). - Reassess if symptoms escalate or imaging findings evolve  Kyle Matthews 08/21/2024, 9:38 AM

## 2024-08-21 NOTE — TOC CM/SW Note (Signed)
 Transition of Care [facility]) - Inpatient Brief Assessment   Patient Details  Name: Kyle Matthews MRN: [id number] Date of Birth: January 20, 1946  Transition of Care [facility]) CM/SW Contact:    Lucie Lunger, LCSWA Phone Number: 08/21/2024, 9:05 AM   Clinical Narrative: Transition of Care Department [facility]) has reviewed patient and no TOC needs have been identified at this time. We will continue to monitor patient advancement through interdiciplinary progression rounds. If new patient transition needs arise, please place a TOC consult.  Transition of Care Asessment: Insurance and Status: Insurance coverage has been reviewed Patient has primary care physician: Yes Home environment has been reviewed: From home Prior level of function:: Independent Prior/Current Home Services: No current home services Social Drivers of Health Review: SDOH reviewed no interventions necessary Readmission risk has been reviewed: Yes Transition of care needs: no transition of care needs at this time

## 2024-08-21 NOTE — Plan of Care (Signed)
   Problem: Education: Goal: Knowledge of the procedure and recovery process will improve Outcome: Progressing   Problem: Bowel/Gastric: Goal: Gastrointestinal status for postoperative course will improve Outcome: Progressing   Problem: Pain Management: Goal: General experience of comfort will improve Outcome: Progressing   Problem: Skin Integrity: Goal: Demonstration of wound healing without infection will improve Outcome: Progressing   Problem: Urinary Elimination: Goal: Ability to avoid or minimize complications of infection will improve Outcome: Progressing Goal: Ability to achieve and maintain urine output will improve Outcome: Progressing Goal: Home care management will improve Outcome: Progressing   Problem: Education: Goal: Knowledge of General Education information will improve Description: Including pain rating scale, medication(s)/side effects and non-pharmacologic comfort measures Outcome: Progressing   Problem: Health Behavior/Discharge Planning: Goal: Ability to manage health-related needs will improve Outcome: Progressing   Problem: Clinical Measurements: Goal: Ability to maintain clinical measurements within normal limits will improve Outcome: Progressing Goal: Will remain free from infection Outcome: Progressing Goal: Diagnostic test results will improve Outcome: Progressing Goal: Respiratory complications will improve Outcome: Progressing Goal: Cardiovascular complication will be avoided Outcome: Progressing   Problem: Activity: Goal: Risk for activity intolerance will decrease Outcome: Progressing   Problem: Nutrition: Goal: Adequate nutrition will be maintained Outcome: Progressing   Problem: Coping: Goal: Level of anxiety will decrease Outcome: Progressing   Problem: Elimination: Goal: Will not experience complications related to bowel motility Outcome: Progressing Goal: Will not experience complications related to urinary  retention Outcome: Progressing   Problem: Pain Managment: Goal: General experience of comfort will improve and/or be controlled Outcome: Progressing   Problem: Safety: Goal: Ability to remain free from injury will improve Outcome: Progressing   Problem: Skin Integrity: Goal: Risk for impaired skin integrity will decrease Outcome: Progressing

## 2024-08-21 NOTE — Care Management CC44 (Signed)
 Condition Code 44 Documentation Completed  Patient Details  Name: Kyle Matthews MRN: [id number] Date of Birth: [birth year]   Condition Code 44 given:  Yes Patient signature on Condition Code 44 notice:  Yes Documentation of 2 MD's agreement:  Yes Code 44 added to claim:  Yes    Lucie Lunger, LCSWA 08/21/2024, 12:16 PM

## 2024-08-21 NOTE — Discharge Instructions (Signed)
 1)Avoid ibuprofen/Advil/Aleve/Motrin/Goody Powders/Naproxen/BC powders/Meloxicam/Diclofenac/Indomethacin and other Nonsteroidal anti-inflammatory medications as these will make you more likely to bleed and can cause stomach ulcers, can also cause Kidney problems.   2)Watch for bleeding while on Blood Thinners--watch for blood in your stool which can make your stool black, maroon, mahogany or red---, blood in your urine which can make your urine pink or red, nosebleeds , also watch for possible bruising -You are taking Apixaban /Eliquis --- which is a blood thinner--- be careful to avoid injury or falls  3) You will need surgery for your gallbladder at some point in the future, however surgery may have to be done sooner rather than later if you develop persistent right-sided abdominal pain, or fevers or chills or persistent vomiting-  4) please ask your cardiologist if you should restart your aspirin  therapy, you are taking Eliquis /apixaban  which is also a blood thinner  5)Low Fat Diet advised  -On a low-fat diet, you can eat plenty of fruits, vegetables, and whole grains like oats, brown rice, and quinoa. Lean protein sources such as chicken breast, [place] breast, fish, beans, and egg whites are excellent choices. Also, include low-fat dairy products like skim milk, low-fat yogurt, and cottage cheese. Small amounts of healthy fats from sources like olive oil and avocado can be used in moderation  Guidelines: Limit fat intake to 30% or less of total daily calories.  Choose lean protein sources such as chicken, fish, and beans.  Select low-fat dairy products like skim milk, yogurt, and cheese. Consume plenty of fruits, vegetables, and whole grains.  Avoid processed foods, fried foods, and foods high in saturated and trans fats.   Recommended Foods: Fruits and vegetables: apples, bananas, berries, carrots, spinach Lean protein: chicken, fish, [place], eggs Whole grains: brown rice, quinoa, oats,  whole wheat bread Low-fat dairy: skim milk, yogurt, cottage cheese Healthy fats: olive oil, avocado, nuts (in moderation)  Ensure you're getting enough essential fatty acids, such as omega-3s and omega-6s.   Sample Meal Plan:  Breakfast: Oatmeal with berries and nuts Lunch: Salad with grilled chicken and low-fat dressing Dinner: Baked salmon with roasted vegetables Snack: Greek yogurt with fruit  Remember, a low-fat diet should be sustainable and enjoyable. It's important to make gradual changes and find healthy food options that fit your lifestyle

## 2024-08-22 ENCOUNTER — Telehealth: Payer: Self-pay | Admitting: Internal Medicine

## 2024-08-22 NOTE — Telephone Encounter (Signed)
 Called patient and made him aware that per Dr. Wendel he does not need to be on aspirin  since he taking Eliquis .Patient verbalized an understanding.

## 2024-08-22 NOTE — Telephone Encounter (Signed)
 Pt c/o medication issue:  1. Name of Medication:   aspirin  81 MG chewable tablet   2. How are you currently taking this medication (dosage and times per day)?   3. Are you having a reaction (difficulty breathing--STAT)?   4. What is your medication issue?   Patient stated he was recently in hospital for gall bladder issue and wants to know if he should restart this medication.

## 2024-08-22 NOTE — Telephone Encounter (Signed)
 Called the patient and the patient verbalized he was in the hospital and at discharge from hospital aspirin  was stopped. Per patient he would like to know if he should start Aspirin  81 mg back per discharge from hospital AVS to follow up with Cardiology.. Made patient aware that this will be forward to provider for advice. Understanding verbalized.

## 2024-08-23 ENCOUNTER — Telehealth (HOSPITAL_COMMUNITY): Payer: Self-pay | Admitting: *Deleted

## 2024-08-23 ENCOUNTER — Other Ambulatory Visit (HOSPITAL_COMMUNITY): Payer: Self-pay | Admitting: *Deleted

## 2024-08-23 NOTE — Telephone Encounter (Signed)
 Patient in ER 9/2-9/3 with gallbladder issues and did not receive Eliquis  during his hospitalization (confirmed with MAR review). Pt back on Eliquis  twice daily as of 9/4. DCCV rescheduled to 9/26 from 9/12. Offered to add TEE to cardioversion but pt declined and will wait 21 days. Pt verbalized agreement of instructions.

## 2024-08-26 ENCOUNTER — Ambulatory Visit: Payer: Self-pay | Admitting: Cardiology

## 2024-08-26 LAB — CUP PACEART REMOTE DEVICE CHECK
Battery Remaining Longevity: 92 mo
Battery Remaining Percentage: 95.5 %
Battery Voltage: 3.01 V
Brady Statistic AP VP Percent: 0 %
Brady Statistic AP VS Percent: 0 %
Brady Statistic AS VP Percent: 0 %
Brady Statistic AS VS Percent: 0 %
Brady Statistic RA Percent Paced: 0 %
Date Time Interrogation Session: 20250901020009
Implantable Lead Connection Status: 753985
Implantable Lead Connection Status: 753985
Implantable Lead Implant Date: 20250714
Implantable Lead Implant Date: 20250714
Implantable Lead Location: 753858
Implantable Lead Location: 753859
Implantable Pulse Generator Implant Date: 20250714
Lead Channel Impedance Value: 3000 Ohm
Lead Channel Impedance Value: 410 Ohm
Lead Channel Impedance Value: 830 Ohm
Lead Channel Pacing Threshold Amplitude: 0.375 V
Lead Channel Pacing Threshold Amplitude: 0.75 V
Lead Channel Pacing Threshold Pulse Width: 0.5 ms
Lead Channel Pacing Threshold Pulse Width: 0.5 ms
Lead Channel Sensing Intrinsic Amplitude: 2.3 mV
Lead Channel Setting Pacing Amplitude: 0.25 V
Lead Channel Setting Pacing Amplitude: 1.375
Lead Channel Setting Pacing Amplitude: 3.5 V
Lead Channel Setting Pacing Pulse Width: 0.1 ms
Lead Channel Setting Pacing Pulse Width: 0.5 ms
Lead Channel Setting Sensing Sensitivity: 4 mV
Pulse Gen Model: 3562
Pulse Gen Serial Number: 8291881

## 2024-08-27 NOTE — Progress Notes (Signed)
 Remote PPM Transmission

## 2024-08-28 DIAGNOSIS — D649 Anemia, unspecified: Secondary | ICD-10-CM | POA: Diagnosis not present

## 2024-08-28 DIAGNOSIS — Z23 Encounter for immunization: Secondary | ICD-10-CM | POA: Diagnosis not present

## 2024-08-28 DIAGNOSIS — Z09 Encounter for follow-up examination after completed treatment for conditions other than malignant neoplasm: Secondary | ICD-10-CM | POA: Diagnosis not present

## 2024-08-28 DIAGNOSIS — K8020 Calculus of gallbladder without cholecystitis without obstruction: Secondary | ICD-10-CM | POA: Diagnosis not present

## 2024-08-30 DIAGNOSIS — I4819 Other persistent atrial fibrillation: Secondary | ICD-10-CM

## 2024-09-05 ENCOUNTER — Encounter: Payer: Self-pay | Admitting: Diagnostic Neuroimaging

## 2024-09-05 ENCOUNTER — Ambulatory Visit: Payer: Self-pay | Admitting: Diagnostic Neuroimaging

## 2024-09-05 ENCOUNTER — Other Ambulatory Visit (HOSPITAL_COMMUNITY): Payer: Self-pay | Admitting: *Deleted

## 2024-09-05 VITALS — BP 122/68 | HR 68 | Ht 63.0 in | Wt 156.8 lb

## 2024-09-05 DIAGNOSIS — Z954 Presence of other heart-valve replacement: Secondary | ICD-10-CM | POA: Diagnosis not present

## 2024-09-05 DIAGNOSIS — I63412 Cerebral infarction due to embolism of left middle cerebral artery: Secondary | ICD-10-CM

## 2024-09-05 DIAGNOSIS — I10 Essential (primary) hypertension: Secondary | ICD-10-CM

## 2024-09-05 DIAGNOSIS — Z952 Presence of prosthetic heart valve: Secondary | ICD-10-CM | POA: Diagnosis not present

## 2024-09-05 DIAGNOSIS — I63432 Cerebral infarction due to embolism of left posterior cerebral artery: Secondary | ICD-10-CM

## 2024-09-05 DIAGNOSIS — I079 Rheumatic tricuspid valve disease, unspecified: Secondary | ICD-10-CM

## 2024-09-05 DIAGNOSIS — I330 Acute and subacute infective endocarditis: Secondary | ICD-10-CM

## 2024-09-05 DIAGNOSIS — E782 Mixed hyperlipidemia: Secondary | ICD-10-CM

## 2024-09-05 MED ORDER — APIXABAN 5 MG PO TABS: 5.0000 mg | ORAL_TABLET | Freq: Two times a day (BID) | ORAL | 6 refills | Status: AC

## 2024-09-05 NOTE — Progress Notes (Signed)
 [facility]  PATIENT: Kyle Matthews DOB: [birth year]  REFERRING CLINICIAN: Shelton Arlin KIDD, MD HISTORY FROM: patient  REASON FOR VISIT: new consult   HISTORICAL  CHIEF COMPLAINT:  Chief Complaint  Patient presents with   New Patient (Initial Visit)    RM 7, Pt w/wife, referred from hospital for stroke.     HISTORY OF PRESENT ILLNESS:   78 year old male here for evaluation of cerebral infarctions.  History of coronary disease, hyperlipidemia, stroke x 2, aortic valve replacement, coronary artery bypass grafting, presented to the hospital on 06/12/2024 due to chest pain and chills.  He had elevated white blood cell count.  He had some shaking movements that were more consistent with rigors, but underwent MRI and EEG testing for seizure evaluation.  MRI demonstrated incidental punctate ischemic infarctions in the left occipital (and also left frontal region per my review).  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  He underwent complex 10-hour surgery to repair these on 06/26/2024 and had a prolonged hospital course.  Eventually he was discharged on 07/04/2024.  Since that time patient is doing stable neurologically.  No vision changes, right sided numbness or weakness symptoms.  He is tolerating medications.    REVIEW OF SYSTEMS: Full 14 system review of systems performed and negative with exception of: As per HPI.  ALLERGIES: Allergies  Allergen Reactions   Tetanus Toxoid-Containing Vaccines Other (See Comments)    Childhood Allergy    Zetia  [Ezetimibe ] Diarrhea    HOME MEDICATIONS: Outpatient Medications Prior to Visit  Medication Sig Dispense Refill   amiodarone  (PACERONE ) 200 MG tablet Take 1 tablet (200 mg total) by mouth daily. 90 tablet 1   amLODipine  (NORVASC ) 5 MG tablet Take 0.5 tablets (2.5 mg total) by mouth daily. 45 tablet 3   apixaban  (ELIQUIS ) 5 MG TABS tablet Take 1 tablet (5 mg total)  by mouth 2 (two) times daily. 60 tablet 6   finasteride  (PROSCAR ) 5 MG tablet Take 5 mg by mouth every evening.     furosemide  (LASIX ) 40 MG tablet Take 1 tablet (40 mg total) by mouth daily. 30 tablet 5   nitroGLYCERIN  (NITROSTAT ) 0.4 MG SL tablet Place 1 tablet (0.4 mg total) under the tongue every 5 (five) minutes as needed for chest pain. 25 tablet 3   oxyCODONE  (OXYCONTIN ) 10 mg 12 hr tablet Take 10 mg by mouth every 12 (twelve) hours as needed (pain).     pantoprazole  (PROTONIX ) 40 MG tablet Take 1 tablet (40 mg total) by mouth daily. 30 tablet 1   potassium chloride  SA (KLOR-CON  M) 20 MEQ tablet Take 1 tablet (20 mEq total) by mouth daily. X 7 days, then change to only days you take a Lasix  30 tablet 1   rosuvastatin  (CRESTOR ) 10 MG tablet Take 1 tablet (10 mg total) by mouth daily. 30 tablet 3   senna-docusate (SENOKOT-S) 8.6-50 MG tablet Take 2 tablets by mouth at bedtime. 60 tablet 1   aspirin  81 MG chewable tablet Chew 1 tablet (81 mg total) by mouth daily. Swallow whole. 30 tablet 5   No facility-administered medications prior to visit.    PAST MEDICAL HISTORY: Past Medical History:  Diagnosis Date   Anemia    Anxiety    Arthritis    BPH (benign prostatic hyperplasia)    Status post biopsy in 2009. - w/ LUTS   Coronary artery disease    a. 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 s/p AVR with 23 mm Edwards Inspiris Resilia pericardial valve in 10/2023   Stroke (HCC) 2007   No true etiology noted   UTI (urinary tract infection)     PAST SURGICAL HISTORY: Past Surgical History:  Procedure Laterality Date   AORTIC VALVE REPLACEMENT N/A 11/02/2023   Procedure: AORTIC VALVE REPLACEMENT (AVR) USING 23 MM INSPIRIS RESILIA AORTIC VALVE;  Surgeon: Lucas Dorise POUR, MD;  Location: [facility] OR;  Service: Open Heart  Surgery;  Laterality: N/A;   BENTALL PROCEDURE N/A 06/26/2024   Procedure: BENTALL PROCEDURE USING KONECT AORTIC VALVE CONDUIT SIZE ;  Surgeon: Lucas Dorise POUR, MD;  Location: [facility] OR;  Service: Open Heart Surgery;  Laterality: N/A;   CORONARY ARTERY BYPASS GRAFT N/A 11/02/2023   Procedure: CORONARY ARTERY BYPASS GRAFTING (CABG) X TWO USING LEFT INTERNAL MAMMARY ARTERY AND RIGHT GREATER SAPHENEOUS VEIN HARVESTED ENDOSCOPICLY;  Surgeon: Lucas Dorise POUR, MD;  Location: [facility] OR;  Service: Open Heart Surgery;  Laterality: N/A;   CORONARY ARTERY BYPASS GRAFT N/A 06/26/2024   Procedure: CORONARY ARTERY BYPASS GRAFTING (CABG) TIMES ONE USING ENDOSCOPICALLY HARVESTED LEFT GREATER SAPHENOUS VEIN;  Surgeon: Lucas Dorise POUR, MD;  Location: [facility] OR;  Service: Open Heart Surgery;  Laterality: N/A;   CORONARY PRESSURE/FFR STUDY N/A 10/09/2023   Procedure: CORONARY PRESSURE/FFR STUDY;  Surgeon: Wendel Lurena POUR, MD;  Location: [facility];  Service: Cardiovascular;  Laterality: N/A;   IR ANGIOGRAM PELVIS SELECTIVE OR SUPRASELECTIVE  10/27/2023   IR ANGIOGRAM PELVIS SELECTIVE OR SUPRASELECTIVE  10/27/2023   IR ANGIOGRAM SELECTIVE EACH ADDITIONAL VESSEL  10/27/2023   IR ANGIOGRAM SELECTIVE EACH ADDITIONAL VESSEL  10/27/2023   IR ANGIOGRAM SELECTIVE EACH ADDITIONAL VESSEL  10/27/2023   IR ANGIOGRAM SELECTIVE EACH ADDITIONAL VESSEL  10/27/2023   IR EMBO ARTERIAL NOT HEMORR HEMANG INC GUIDE ROADMAPPING  10/27/2023   IR US  GUIDE VASC ACCESS RIGHT  10/27/2023   PACEMAKER IMPLANT N/A 07/01/2024   Procedure: PACEMAKER IMPLANT;  Surgeon: Inocencio Soyla Lunger, MD;  Location: [facility];  Service: Cardiovascular;  Laterality: N/A;   REDO STERNOTOMY N/A 06/26/2024   Procedure: REDO STERNOTOMY;  Surgeon: Lucas Dorise POUR, MD;  Location: [facility];  Service: Open Heart Surgery;  Laterality: N/A;   RIGHT HEART CATH AND CORONARY ANGIOGRAPHY N/A 06/20/2024   Procedure: RIGHT HEART CATH AND CORONARY ANGIOGRAPHY;  Surgeon: Ladona Heinz,  MD;  [facility]: [facility];  Service: Cardiovascular;  Laterality: N/A;   RIGHT/LEFT HEART CATH AND CORONARY ANGIOGRAPHY N/A 10/09/2023   Procedure: RIGHT/LEFT HEART CATH AND CORONARY ANGIOGRAPHY;  Surgeon: Wendel Lurena POUR, MD;  [facility]: [facility];  Service: Cardiovascular;  Laterality: N/A;   TEE WITHOUT CARDIOVERSION N/A 11/02/2023   Procedure: TRANSESOPHAGEAL ECHOCARDIOGRAM;  Surgeon: Lucas Dorise POUR, MD;  Location: [facility] OR;  Service: Open Heart Surgery;  Laterality: N/A;   TEE WITHOUT CARDIOVERSION N/A 06/26/2024   Procedure: ECHOCARDIOGRAM, TRANSESOPHAGEAL;  Surgeon: Lucas Dorise POUR, MD;  Location: [facility];  Service: Open Heart Surgery;  Laterality: N/A;   TRANSESOPHAGEAL ECHOCARDIOGRAM (CATH LAB) N/A 06/18/2024   Procedure: TRANSESOPHAGEAL ECHOCARDIOGRAM;  Surgeon: Jeffrie Oneil BROCKS, MD;  Location: [facility];  Service: Cardiovascular;  Laterality: N/A;   TRANSTHORACIC ECHOCARDIOGRAM  04/28/2017    EF 65-70%. GR 1 DD. No regional  wall motion abnormality. Moderate aortic stenosis with moderate regurgitation. Mean gradient 34 mmHg.   TRICUSPID VALVE REPLACEMENT N/A 06/26/2024   Procedure: TRICUSPID VALVE REPLACEMENT USING MOSIAC BRIOPROSTHESIS VALVE SIZE ;  Surgeon: Lucas Dorise POUR, MD;  Location: [facility] OR;  Service: Open Heart Surgery;  Laterality: N/A;   XI ROBOTIC ASSISTED SIMPLE PROSTATECTOMY N/A 05/22/2024   Procedure: PROSTATECTOMY, SIMPLE, ROBOT-ASSISTED;  Surgeon: Alvaro Ricardo KATHEE Mickey., MD;  Location: [facility] ORS;  Service: Urology;  Laterality: N/A;    FAMILY HISTORY: Family History  Problem Relation Age of Onset   Coronary artery disease Mother 57       19. 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  Tobacco Use   Smoking status: Former    Current packs/day: 0.00    Average packs/day: 1.5 packs/day for 24.0 years (36.0 ttl pk-yrs)    Types: Cigarettes    Quit date: 08/21/2023    Years since quitting: 1.0    Passive exposure: Past   Smokeless tobacco: Former   Tobacco comments:    Former smoker 07/17/24  Vaping Use   Vaping status: Never Used  Substance and Sexual Activity   Alcohol use: Not Currently    Alcohol/week: 6.0 standard drinks of alcohol    Types: 6 Cans of beer per week   Drug use: No   Sexual activity: Not Currently  Other Topics Concern   Not on file  Social History Narrative   Married father of 1. Lives with his wife of 49 years. He is a retired Marine scientist used to work for [employer]   4 cups of coffee a day.    No structured exercise regimen   09-05-24 Pt reports smoking 1 or 2 cigarettes on occasion   Social Drivers of Health   Financial Resource Strain: Not on file  Food Insecurity: No Food Insecurity (08/20/2024)   Hunger Vital Sign    Worried About Running Out of Food in the Last Year: Never true    Ran Out of Food in the Last Year: Never true  Recent Concern: Food Insecurity - Food Insecurity Present (05/22/2024)   Hunger Vital Sign    Worried About Running Out of Food in the Last Year: Sometimes true    Ran Out of Food in the Last Year: Sometimes true  Transportation Needs: No Transportation Needs (08/20/2024)   PRAPARE - Administrator, Civil Service (Medical): No    Lack of Transportation (Non-Medical): No  Physical Activity: Not on file  Stress: Not on file  Social Connections: Moderately Integrated (08/20/2024)   Social Connection and  Isolation Panel    Frequency of Communication with Friends and Family: More than three times a week    Frequency of Social Gatherings with Friends and Family: Twice a week    Attends Religious Services: More  than 4 times per year    Active Member of Clubs or Organizations: No    Attends Banker Meetings: Never    Marital Status: Married  Catering manager Violence: Not At Risk (08/20/2024)   Humiliation, Afraid, Rape, and Kick questionnaire    Fear of Current or Ex-Partner: No    Emotionally Abused: No    Physically Abused: No    Sexually Abused: No     PHYSICAL EXAM  GENERAL EXAM/CONSTITUTIONAL: Vitals:  Vitals:   09/05/24 1532  BP: 122/68  Pulse: 68  Weight: 156 lb 12.8 oz (71.1 kg)  Height: 5' 3 (1.6 m)   Body mass index is 27.78 kg/m. Wt Readings from Last 3 Encounters:  09/05/24 156 lb 12.8 oz (71.1 kg)  08/21/24 146 lb 6.2 oz (66.4 kg)  08/13/24 155 lb (70.3 kg)   Patient is in no distress; well developed, nourished and groomed; neck is supple  CARDIOVASCULAR: Examination of carotid arteries is normal; no carotid bruits Regular rate and rhythm, BLOWING SYSTOLIC MURMUR  EYES: Ophthalmoscopic exam of optic discs and posterior segments is normal; no papilledema or hemorrhages No results found.  MUSCULOSKELETAL: Gait, strength, tone, movements noted in Neurologic exam below  NEUROLOGIC: MENTAL STATUS:      No data to display         awake, alert, oriented to person, place and time recent and remote memory intact normal attention and concentration language fluent, comprehension intact, naming intact fund of knowledge appropriate  CRANIAL NERVE:  2nd - no papilledema on fundoscopic exam 2nd, 3rd, 4th, 6th - pupils equal and reactive to light, visual fields full to confrontation, extraocular muscles intact, no nystagmus 5th - facial sensation symmetric 7th - facial strength symmetric 8th - hearing intact 9th - palate elevates symmetrically,  uvula midline 11th - shoulder shrug symmetric 12th - tongue protrusion midline  MOTOR:  normal bulk and tone, full strength in the BUE, BLE  SENSORY:  normal and symmetric to light touch, temperature, vibration  COORDINATION:  finger-nose-finger, fine finger movements normal  REFLEXES:  deep tendon reflexes TRACE and symmetric  GAIT/STATION:  narrow based gait     DIAGNOSTIC DATA (LABS, IMAGING, TESTING) - I reviewed patient records, labs, notes, testing and imaging myself where available.  Lab Results  Component Value Date   WBC 6.9 08/21/2024   HGB 11.3 (L) 08/21/2024   HCT 36.9 (L) 08/21/2024   MCV 90.9 08/21/2024   PLT 177 08/21/2024      Component Value Date/Time   NA 139 08/21/2024 0423   K 3.5 08/21/2024 0423   CL 107 08/21/2024 0423   CO2 24 08/21/2024 0423   GLUCOSE 90 08/21/2024 0423   BUN 13 08/21/2024 0423   CREATININE 0.76 08/21/2024 0423   CALCIUM  9.1 08/21/2024 0423   PROT 7.2 08/21/2024 0423   PROT 6.6 01/29/2024 1633   ALBUMIN  3.6 08/21/2024 0423   ALBUMIN  4.1 01/29/2024 1633   AST 19 08/21/2024 0423   ALT 19 08/21/2024 0423   ALKPHOS 74 08/21/2024 0423   BILITOT 0.5 08/21/2024 0423   BILITOT <0.2 01/29/2024 1633   GFRNONAA >60 08/21/2024 0423   Lab Results  Component Value Date   CHOL 124 06/14/2024   HDL 40 (L) 06/14/2024   LDLCALC 66 06/14/2024   TRIG 91 06/14/2024   CHOLHDL 3.1 06/14/2024   Lab Results  Component Value Date   HGBA1C 5.1 06/13/2024   Lab Results  Component Value Date   VITAMINB12 212 10/06/2023  Lab Results  Component Value Date   TSH 0.937 06/12/2024    08/01/24 TTE 1. 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 Mean  TV gradient 4 mmHg. Trivial tricuspid regurgitation with grossly normal  prosthetic function. The tricuspid valve is has been repaired/replaced.   2. The aortic valve has been repaired/replaced. Aortic valve  regurgitation is not visualized. There is a 21 mm KONECT  RESILIA  pericardial valve graft present in the aortic position. Procedure Date:  06/26/2024. Mean AV gradient 7.7 mmHg suggesting normal  function. Dimentionless index 0.68.   3. Left pleural effusion present, likely at least moderate based on  available images.   4. Right ventricular systolic function is low normal. The right  ventricular size is normal. Tricuspid regurgitation signal is inadequate  for assessing PA pressure. Device wire present.   5. The mitral valve is degenerative. Moderate mitral valve regurgitation.  The mean mitral valve gradient is 4.0 mmHg. Moderate mitral annular  calcification.   6. Aortic dilatation noted. There is borderline dilatation of the aortic  root, measuring 40 mm.   7. Left ventricular ejection fraction, by estimation, is 60 to 65%. Left  ventricular ejection fraction by 3D volume is 56 %. The left ventricle has  normal function. The left ventricle demonstrates regional wall motion  abnormalities (see scoring  diagram/findings for description). Left ventricular diastolic parameters  are indeterminate.   8. The inferior vena cava is dilated in size with <50% respiratory  variability, suggesting right atrial pressure of 15 mmHg.    06/13/24 MRI brain / MRA head (without) [I reviewed images myself and agree with interpretation. Small punctate DWI hyperintensities in the left occipital periventricular region. Additional left frontal punctate DWI hyperintensity series 5, image 44. -VRP]  1. 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 2. No emergent large vessel occlusion or proximal hemodynamically significant stenosis.  06/13/24 CTA head / neck  1. Negative CTA for large vessel occlusion. 2. Moderate atherosclerotic change about the carotid bifurcations/proximal ICAs without hemodynamically significant greater than 50% stenosis. 3. Wide patency of both vertebral arteries within the neck. 4. Aortic Atherosclerosis (ICD10-I70.0) and Emphysema  (ICD10-J43.9).   ASSESSMENT AND PLAN  78 y.o. year old male here with:   Dx:  1. Cerebral infarction due to embolism of left middle cerebral artery (HCC)   2. 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 S/P Bentall Procedure   4. S/P TVR (tricuspid valve replacement)   5. Mixed hyperlipidemia   6. Essential hypertension   7. Endocarditis of tricuspid valve   8. Endocarditis of prosthetic aortic valve     PLAN:  PUNCTATE CEREBRAL INFARCTIONS (found on MRI brain 06/13/24 after shaking episode likely rigors from underlying infection; DWI positive findings likely embolic phenomenon in setting of endocarditis; now s/p bentall procedure and tricuspid valve replacement) - continue stroke prevention measures (eliquis  for afib, amlodipine  for BP and rosuvastatin  for hyperlipidemia)  Return for pending if symptoms worsen or fail to improve, return to PCP.     EDUARD FABIENE HANLON, MD 09/05/2024, 4:23 PM Certified in Neurology, Neurophysiology and Neuroimaging  [facility] [address] [place], [place] [address] [phone]

## 2024-09-05 NOTE — Patient Instructions (Signed)
-   continue stroke prevention measures (eliquis  for afib, amlodipine  for BP and rosuvastatin  for hyperlipidemia)

## 2024-09-09 DIAGNOSIS — R338 Other retention of urine: Secondary | ICD-10-CM | POA: Diagnosis not present

## 2024-09-09 DIAGNOSIS — N170 Acute kidney failure with tubular necrosis: Secondary | ICD-10-CM | POA: Diagnosis not present

## 2024-09-11 ENCOUNTER — Ambulatory Visit: Admitting: Internal Medicine

## 2024-09-11 ENCOUNTER — Other Ambulatory Visit: Payer: Self-pay

## 2024-09-11 VITALS — BP 133/78 | HR 70 | Temp 98.6°F | Resp 16

## 2024-09-11 DIAGNOSIS — I359 Nonrheumatic aortic valve disorder, unspecified: Secondary | ICD-10-CM | POA: Diagnosis not present

## 2024-09-11 NOTE — Progress Notes (Signed)
 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   Congestive heart failure (HCC) 06/18/2024   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   History of stroke 06/18/2024   Acute embolic stroke (HCC) 06/15/2024   Bacteremia 06/14/2024   SIRS (systemic inflammatory response syndrome) (HCC) 06/13/2024   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   S/P AVR (aortic valve replacement) 11/02/2023   Chronic heart failure with preserved ejection fraction (HFpEF) (HCC) 11/01/2023   Acute blood loss anemia 10/30/2023   Benign hypertension 10/30/2023   Acute heart failure with preserved ejection fraction (HFpEF) (HCC) 10/28/2023   Atherosclerosis of native coronary artery of native heart without angina pectoris 10/28/2023   Pressure injury of skin 10/28/2023   Chest pain 10/23/2023   History of essential hypertension 10/23/2023   GERD (gastroesophageal reflux disease) 10/23/2023   NSTEMI (non-ST elevated myocardial infarction) (HCC) 10/11/2023   Aortic valve disease 10/11/2023   Coronary artery disease involving native coronary artery of native heart with unstable angina pectoris (HCC) 10/10/2023   Chronic blood loss anemia 10/10/2023   Gross hematuria 10/07/2023   BPH with urinary obstruction 10/04/2023   Acute on chronic diastolic CHF (congestive heart failure) (HCC) 10/04/2023   Symptomatic anemia 10/03/2023    Kidney lesion, native, left 10/03/2023   Acute respiratory failure with hypoxia (HCC) 10/03/2023   Lesion of right native kidney 10/03/2023   Elevated brain natriuretic peptide (BNP) level 10/03/2023   Coronary artery disease 10/03/2023   SOB (shortness of breath) 10/03/2023   Bladder outlet obstruction 10/03/2023   Chronic indwelling Foley catheter 10/03/2023   Hematuria 10/03/2023   Enlarged prostate 10/03/2023   Moderate aortic regurgitation 10/03/2023   Hyperlipidemia 05/20/2017   Nicotine dependence, cigarettes, uncomplicated 05/20/2017   Carotid bruit 05/19/2017   Essential hypertension    RBBB 04/13/2017   Severe aortic stenosis 04/13/2017     Subjective:  Kyle Matthews is a Kyle Matthews is a 78 y.o. 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 - patient underwent Bentall procedure, CABG, 2 TV valve replacement. Old prosthetic valve removed and sent of Cx on 7/9. Communicated with cardiothoracic who noted all old prosthesis out.  He was discharged on daptomycin  x 6 weeks EOT 08/07/2024 from OR.  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 07/17/2024: tolerating abx, no complaints. No missed doses.  08/07/24: Doing well no new complaints 09/11/24: feeeling well. Interim had hospitalization for cholethisis, palns to f/u GS. Denies fevers and chills  Review of Systems  All other systems reviewed and are negative.   Past Medical History:  Diagnosis Date   Anemia    Anxiety    Arthritis    BPH (benign prostatic hyperplasia)    Status post biopsy in 2009. - w/ LUTS   Coronary artery disease  a. s/p CABG in 10/2023 with LIMA-LAD and SVG-Ramus at the time of AVR   Depression    Essential hypertension    Hyperlipidemia    On Crestor  - followed by PCP   Moderate calcific aortic stenosis 04/2017   a. s/p AVR with 23 mm Edwards Inspiris Resilia pericardial valve in 10/2023   Stroke (HCC)  2007   No true etiology noted   UTI (urinary tract infection)     Outpatient Medications Prior to Visit  Medication Sig Dispense Refill   amiodarone  (PACERONE ) 200 MG tablet Take 1 tablet (200 mg total) by mouth daily. 90 tablet 1   amLODipine  (NORVASC ) 5 MG tablet Take 0.5 tablets (2.5 mg total) by mouth daily. 45 tablet 3   apixaban  (ELIQUIS ) 5 MG TABS tablet Take 1 tablet (5 mg total) by mouth 2 (two) times daily. 60 tablet 6   finasteride  (PROSCAR ) 5 MG tablet Take 5 mg by mouth every evening.     furosemide  (LASIX ) 40 MG tablet Take 1 tablet (40 mg total) by mouth daily. 30 tablet 5   nitroGLYCERIN  (NITROSTAT ) 0.4 MG SL tablet Place 1 tablet (0.4 mg total) under the tongue every 5 (five) minutes as needed for chest pain. 25 tablet 3   oxyCODONE  (OXYCONTIN ) 10 mg 12 hr tablet 1 tablet Orally every 12 hrs As needed     pantoprazole  (PROTONIX ) 40 MG tablet Take 1 tablet (40 mg total) by mouth daily. 30 tablet 1   potassium chloride  SA (KLOR-CON  M) 20 MEQ tablet Take 1 tablet (20 mEq total) by mouth daily. X 7 days, then change to only days you take a Lasix  30 tablet 1   rosuvastatin  (CRESTOR ) 10 MG tablet Take 1 tablet (10 mg total) by mouth daily. (Patient taking differently: Take 40 mg by mouth daily.) 30 tablet 3   senna-docusate (SENOKOT-S) 8.6-50 MG tablet Take 2 tablets by mouth at bedtime. 60 tablet 1   aspirin  81 MG chewable tablet Chew 81 mg by mouth daily. (Patient not taking: Reported on 09/11/2024)     No facility-administered medications prior to visit.     Allergies  Allergen Reactions   Tetanus Toxoid-Containing Vaccines Other (See Comments)    Childhood Allergy    Zetia  [Ezetimibe ] Diarrhea    Social History   Tobacco Use   Smoking status: Former    Current packs/day: 0.00    Average packs/day: 1.5 packs/day for 24.0 years (36.0 ttl pk-yrs)    Types: Cigarettes    Quit date: 08/21/2023    Years since quitting: 1.0    Passive exposure: Past   Smokeless tobacco:  Former   Tobacco comments:    Former smoker 07/17/24  Vaping Use   Vaping status: Never Used  Substance Use Topics   Alcohol use: Not Currently    Alcohol/week: 6.0 standard drinks of alcohol    Types: 6 Cans of beer per week   Drug use: No    Family History  Problem Relation Age of Onset   Coronary artery disease Mother 62       7. Was diagnosed with a blood clot - suspect that this was a STEMI   Lung cancer Father    Thyroid  disease Sister    Healthy Brother 50   Pancreatic cancer Sister 22   Cancer Maternal Grandmother    Heart disease Maternal Grandfather 16   Cancer Paternal Grandmother    Stroke Paternal Grandfather     Objective:  There were no vitals filed for  this visit. There is no height or weight on file to calculate BMI.  Physical Exam Constitutional:      General: He is not in acute distress.    Appearance: He is normal weight. He is not toxic-appearing.  HENT:     Head: Normocephalic and atraumatic.     Right Ear: External ear normal.     Left Ear: External ear normal.     Nose: No congestion or rhinorrhea.     Mouth/Throat:     Mouth: Mucous membranes are moist.     Pharynx: Oropharynx is clear.  Eyes:     Extraocular Movements: Extraocular movements intact.     Conjunctiva/sclera: Conjunctivae normal.     Pupils: Pupils are equal, round, and reactive to light.  Cardiovascular:     Rate and Rhythm: Normal rate and regular rhythm.     Heart sounds: No murmur heard.    No friction rub. No gallop.  Pulmonary:     Effort: Pulmonary effort is normal.     Breath sounds: Normal breath sounds.  Abdominal:     General: Abdomen is flat. Bowel sounds are normal.     Palpations: Abdomen is soft.  Musculoskeletal:        General: No swelling.     Cervical back: Normal range of motion and neck supple.  Skin:    General: Skin is warm and dry.  Neurological:     General: No focal deficit present.     Mental Status: He is oriented to person, place, and  time.  Psychiatric:        Mood and Affect: Mood normal.     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 Old prosthetic valve removed and sent of Cx on 7/9. Communicated with cardiothoracic who noted all old prosthesis out.  He was discharged on daptomycin  x 6 weeks EOT 08/07/2024 from OR.   - Discharged on daptomycin  x 6 weeks from the OR EOT 8/20.  -EP noted on 7/30 that if pt does not chemically convert then plan on dccv -TTE on 8/14 without vegetation noted on AoV/TV Reviewed labs from 9/3 cbc and cmp -stable Plan: -Blood Cx and labs today -Follow up with iD prn   #Medication management #PICC line-out   Loney Stank, MD [facility]   09/11/24  1:36 PM I have personally spent 42 minutes involved in face-to-face and non-face-to-face activities for this patient on the day of the visit. Professional time spent includes the following activities: Preparing  to see the patient (review of tests), Obtaining and/or reviewing separately obtained history (admission/discharge record), Performing a medically appropriate examination and/or  evaluation , Ordering medications/tests/procedures, referring and communicating with other health care professionals, Documenting clinical information in the EMR, Independently interpreting results (not separately reported), Communicating results to the patient/family/caregiver, Counseling and educating the patient/family/caregiver and Care coordination (not separately reported).

## 2024-09-12 NOTE — Progress Notes (Signed)
 Left VM for return call to review instructions for appointment on 09/13/24.

## 2024-09-13 ENCOUNTER — Ambulatory Visit (HOSPITAL_COMMUNITY)

## 2024-09-13 ENCOUNTER — Ambulatory Visit (HOSPITAL_COMMUNITY)
Admission: RE | Admit: 2024-09-13 | Discharge: 2024-09-13 | Disposition: A | Discharge status: Home / Self Care | Attending: Cardiology | Admitting: Cardiology

## 2024-09-13 ENCOUNTER — Other Ambulatory Visit: Payer: Self-pay

## 2024-09-13 ENCOUNTER — Encounter (HOSPITAL_COMMUNITY)
Admission: RE | Disposition: A | Discharge status: Home / Self Care | Payer: Self-pay | Source: Home / Self Care | Attending: Cardiology

## 2024-09-13 DIAGNOSIS — Z87891 Personal history of nicotine dependence: Secondary | ICD-10-CM | POA: Diagnosis not present

## 2024-09-13 DIAGNOSIS — I110 Hypertensive heart disease with heart failure: Secondary | ICD-10-CM | POA: Diagnosis not present

## 2024-09-13 DIAGNOSIS — I509 Heart failure, unspecified: Secondary | ICD-10-CM | POA: Diagnosis not present

## 2024-09-13 DIAGNOSIS — Z8673 Personal history of transient ischemic attack (TIA), and cerebral infarction without residual deficits: Secondary | ICD-10-CM | POA: Insufficient documentation

## 2024-09-13 DIAGNOSIS — I252 Old myocardial infarction: Secondary | ICD-10-CM | POA: Insufficient documentation

## 2024-09-13 DIAGNOSIS — I4892 Unspecified atrial flutter: Secondary | ICD-10-CM | POA: Diagnosis not present

## 2024-09-13 DIAGNOSIS — K219 Gastro-esophageal reflux disease without esophagitis: Secondary | ICD-10-CM | POA: Insufficient documentation

## 2024-09-13 DIAGNOSIS — I2510 Atherosclerotic heart disease of native coronary artery without angina pectoris: Secondary | ICD-10-CM | POA: Diagnosis not present

## 2024-09-13 DIAGNOSIS — J449 Chronic obstructive pulmonary disease, unspecified: Secondary | ICD-10-CM | POA: Diagnosis not present

## 2024-09-13 DIAGNOSIS — N400 Enlarged prostate without lower urinary tract symptoms: Secondary | ICD-10-CM | POA: Insufficient documentation

## 2024-09-13 DIAGNOSIS — Z720 Tobacco use: Secondary | ICD-10-CM | POA: Diagnosis not present

## 2024-09-13 DIAGNOSIS — Z952 Presence of prosthetic heart valve: Secondary | ICD-10-CM | POA: Insufficient documentation

## 2024-09-13 DIAGNOSIS — I4819 Other persistent atrial fibrillation: Secondary | ICD-10-CM | POA: Insufficient documentation

## 2024-09-13 DIAGNOSIS — Z951 Presence of aortocoronary bypass graft: Secondary | ICD-10-CM | POA: Diagnosis not present

## 2024-09-13 DIAGNOSIS — E785 Hyperlipidemia, unspecified: Secondary | ICD-10-CM | POA: Insufficient documentation

## 2024-09-13 DIAGNOSIS — I10 Essential (primary) hypertension: Secondary | ICD-10-CM

## 2024-09-13 HISTORY — PX: CARDIOVERSION: EP1203

## 2024-09-13 SURGERY — CARDIOVERSION (CATH LAB)
Anesthesia: General

## 2024-09-13 MED ORDER — LIDOCAINE 2% (20 MG/ML) 5 ML SYRINGE
INTRAMUSCULAR | Status: DC | PRN
  Administered 2024-09-13: 60 mg via INTRAVENOUS

## 2024-09-13 MED ORDER — SODIUM CHLORIDE 0.9% FLUSH
INTRAVENOUS | Status: DC | PRN
Start: 2024-09-13 — End: 2024-09-13
  Administered 2024-09-13: 3 mL via INTRAVENOUS
  Administered 2024-09-13: 5 mL via INTRAVENOUS

## 2024-09-13 MED ORDER — PROPOFOL 10 MG/ML IV BOLUS
INTRAVENOUS | Status: DC | PRN
Start: 2024-09-13 — End: 2024-09-13
  Administered 2024-09-13: 40 mg via INTRAVENOUS
  Administered 2024-09-13: 10 mg via INTRAVENOUS

## 2024-09-13 SURGICAL SUPPLY — 1 items: PAD DEFIB RADIO PHYSIO CONN (PAD) ×2 IMPLANT

## 2024-09-13 NOTE — Anesthesia Preprocedure Evaluation (Signed)
 Anesthesia Evaluation  Patient identified by MRN, date of birth, ID band Patient awake    Reviewed: Allergy & Precautions, H&P , NPO status , Patient's Chart, lab work & pertinent test results  History of Anesthesia Complications (+) history of anesthetic complications  Airway Mallampati: II   Neck ROM: full    Dental   Pulmonary Patient abstained from smoking., former smoker   breath sounds clear to auscultation       Cardiovascular hypertension, (-) angina + CAD, + Past MI, + CABG and +CHF  + Valvular Problems/Murmurs AS  Rhythm:regular Rate:Normal  IMPRESSIONS     1. 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 Mean  TV gradient 4 mmHg. Trivial tricuspid regurgitation with grossly normal  prosthetic function. The tricuspid valve is has been repaired/replaced.   2. The aortic valve has been repaired/replaced. Aortic valve  regurgitation is not visualized. There is a 21 mm KONECT RESILIA  pericardial valve graft present in the aortic position. Procedure Date:  06/26/2024. Mean AV gradient 7.7 mmHg suggesting normal  function. Dimentionless index 0.68.   3. Left pleural effusion present, likely at least moderate based on  available images.   4. Right ventricular systolic function is low normal. The right  ventricular size is normal. Tricuspid regurgitation signal is inadequate  for assessing PA pressure. Device wire present.   5. The mitral valve is degenerative. Moderate mitral valve regurgitation.  The mean mitral valve gradient is 4.0 mmHg. Moderate mitral annular  calcification.   6. Aortic dilatation noted. There is borderline dilatation of the aortic  root, measuring 40 mm.   7. Left ventricular ejection fraction, by estimation, is 60 to 65%. Left  ventricular ejection fraction by 3D volume is 56 %. The left ventricle has  normal function. The left ventricle demonstrates regional wall motion   abnormalities (see scoring  diagram/findings for description). Left ventricular diastolic parameters  are indeterminate.   8. The inferior vena cava is dilated in size with <50% respiratory  variability, suggesting right atrial pressure of 15 mmHg.     Neuro/Psych  PSYCHIATRIC DISORDERS Anxiety Depression    CVA    GI/Hepatic ,GERD  ,,  Endo/Other    Renal/GU      Musculoskeletal  (+) Arthritis ,    Abdominal   Peds  Hematology   Anesthesia Other Findings   Reproductive/Obstetrics                              Anesthesia Physical Anesthesia Plan  ASA: 4  Anesthesia Plan: General   Post-op Pain Management:    Induction: Intravenous  PONV Risk Score and Plan: 2 and Treatment may vary due to age or medical condition  Airway Management Planned: Natural Airway  Additional Equipment:   Intra-op Plan:   Post-operative Plan:   Informed Consent: I have reviewed the patients History and Physical, chart, labs and discussed the procedure including the risks, benefits and alternatives for the proposed anesthesia with the patient or authorized representative who has indicated his/her understanding and acceptance.     Dental advisory given  Plan Discussed with: CRNA, Anesthesiologist and Surgeon  Anesthesia Plan Comments:          Anesthesia Quick Evaluation

## 2024-09-13 NOTE — H&P (Addendum)
 78 y.o. 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  Noted to be in atrial flutter on device interrogation, presents today for cardioversion.  Denies symptoms from aflutter.  GEN:  in no acute distress HEENT: normal Neck: no JVD Cardiac: RRR; 2/6 systolic murmur Respiratory:  clear to auscultation bilaterally, normal work of breathing GI: soft, nontender MS: no deformity or atrophy Skin: warm and dry, no rash Neuro:  Alert and Oriented x 3, Strength and sensation are intact Psych: normal affect  A/P: Presents for cardioversion.  Device interrogation confirms atrial flutter.  Denies any missed doses of Eliquis .  Will proceed with DCCV.  [name], MD

## 2024-09-13 NOTE — CV Procedure (Signed)
 Procedure:   DCCV  Indication:  Symptomatic atrial flutter  Procedure Note:  The patient signed informed consent.  They have had had therapeutic anticoagulation with ELiquis  greater than 3 weeks.  Anesthesia was administered by Dr. Erma and Ike Hancock, CRNA.  Adequate airway was maintained throughout and vital followed per protocol.  They were cardioverted x 1 with 100J of biphasic synchronized energy.  They converted to A-sensed, V-paced rhythm, confirmed on device interrogation.  There were no apparent complications.  The patient had normal neuro status and respiratory status post procedure with vitals stable as recorded elsewhere.    Follow up:  They will continue on current medical therapy and follow up with cardiology as scheduled.  Lonni Nanas, MD 09/13/2024 7:47 AM

## 2024-09-13 NOTE — Transfer of Care (Signed)
 Immediate Anesthesia Transfer of Care Note  Patient: Kyle Matthews  Procedure(s) Performed: CARDIOVERSION  Patient Location: Cath Lab  Anesthesia Type:General  Level of Consciousness: awake, alert , oriented, and patient cooperative  Airway & Oxygen Therapy: Patient Spontanous Breathing and Patient connected to nasal cannula oxygen  Post-op Assessment: Report given to RN and Post -op Vital signs reviewed and stable  Post vital signs: Reviewed and stable  Last Vitals:  Vitals Value Taken Time  BP 127/86 MAP 98 07:48  Temp    Pulse 90   Resp 16   SpO2 99%     Last Pain:  Vitals:   09/13/24 0707  PainSc: 0-No pain         Complications: No notable events documented.

## 2024-09-13 NOTE — Anesthesia Postprocedure Evaluation (Signed)
 Anesthesia Post Note  Patient: Kyle Matthews  Procedure(s) Performed: CARDIOVERSION     Patient location during evaluation: PACU Anesthesia Type: General Level of consciousness: awake and alert Pain management: pain level controlled Vital Signs Assessment: post-procedure vital signs reviewed and stable Respiratory status: spontaneous breathing, nonlabored ventilation, respiratory function stable and patient connected to nasal cannula oxygen Cardiovascular status: blood pressure returned to baseline and stable Postop Assessment: no apparent nausea or vomiting Anesthetic complications: no   No notable events documented.  Last Vitals:  Vitals:   09/13/24 0749 09/13/24 0800  BP: 121/86 131/88  Pulse: 91   Resp: 16   Temp:    SpO2:      Last Pain:  Vitals:   09/13/24 0749  PainSc: 0-No pain                 [name]

## 2024-09-15 ENCOUNTER — Encounter (HOSPITAL_COMMUNITY): Payer: Self-pay | Admitting: Cardiology

## 2024-09-17 ENCOUNTER — Other Ambulatory Visit: Payer: Self-pay | Admitting: Pulmonary Disease

## 2024-09-17 ENCOUNTER — Other Ambulatory Visit: Payer: Self-pay | Admitting: Physician Assistant

## 2024-09-17 LAB — CULTURE, BLOOD (SINGLE)
MICRO NUMBER:: 17013665
MICRO NUMBER:: 17013675
Result:: NO GROWTH
Result:: NO GROWTH
SPECIMEN QUALITY:: ADEQUATE
SPECIMEN QUALITY:: ADEQUATE

## 2024-09-17 LAB — CBC WITH DIFFERENTIAL/PLATELET
Absolute Lymphocytes: 1452 {cells}/uL (ref 850–3900)
Absolute Monocytes: 647 {cells}/uL (ref 200–950)
Basophils Absolute: 73 {cells}/uL (ref 0–200)
Basophils Relative: 1.1 %
Eosinophils Absolute: 139 {cells}/uL (ref 15–500)
Eosinophils Relative: 2.1 %
HCT: 35.2 % — ABNORMAL LOW (ref 38.5–50.0)
Hemoglobin: 10.8 g/dL — ABNORMAL LOW (ref 13.2–17.1)
MCH: 27.1 pg (ref 27.0–33.0)
MCHC: 30.7 g/dL — ABNORMAL LOW (ref 32.0–36.0)
MCV: 88.2 fL (ref 80.0–100.0)
MPV: 10 fL (ref 7.5–12.5)
Monocytes Relative: 9.8 %
Neutro Abs: 4290 {cells}/uL (ref 1500–7800)
Neutrophils Relative %: 65 %
Platelets: 168 Thousand/uL (ref 140–400)
RBC: 3.99 Million/uL — ABNORMAL LOW (ref 4.20–5.80)
RDW: 14.8 % (ref 11.0–15.0)
Total Lymphocyte: 22 %
WBC: 6.6 Thousand/uL (ref 3.8–10.8)

## 2024-09-17 LAB — COMPREHENSIVE METABOLIC PANEL WITH GFR
AG Ratio: 1.3 (calc) (ref 1.0–2.5)
ALT: 12 U/L (ref 9–46)
AST: 16 U/L (ref 10–35)
Albumin: 3.9 g/dL (ref 3.6–5.1)
Alkaline phosphatase (APISO): 80 U/L (ref 35–144)
BUN: 15 mg/dL (ref 7–25)
CO2: 25 mmol/L (ref 20–32)
Calcium: 8.7 mg/dL (ref 8.6–10.3)
Chloride: 104 mmol/L (ref 98–110)
Creat: 0.91 mg/dL (ref 0.70–1.28)
Globulin: 3.1 g/dL (ref 1.9–3.7)
Glucose, Bld: 93 mg/dL (ref 65–99)
Potassium: 3.8 mmol/L (ref 3.5–5.3)
Sodium: 137 mmol/L (ref 135–146)
Total Bilirubin: 0.3 mg/dL (ref 0.2–1.2)
Total Protein: 7 g/dL (ref 6.1–8.1)
eGFR: 87 mL/min/1.73m2 (ref >=60)

## 2024-09-20 ENCOUNTER — Other Ambulatory Visit: Payer: Self-pay | Admitting: Surgery

## 2024-09-20 ENCOUNTER — Telehealth: Payer: Self-pay

## 2024-09-20 NOTE — Telephone Encounter (Signed)
 I will forward this to preop APP to review notes from Dr. Wendel.

## 2024-09-20 NOTE — Telephone Encounter (Signed)
   Name:  Dieter Hane  DOB:  01-12-1946  MRN:  [id number]   Primary Cardiologist: Arun K Thukkani, MD  Chart reviewed as part of pre-operative protocol coverage. Patient was contacted 09/20/2024 in reference to pre-operative risk assessment for pending surgery as outlined below.   Per Dr.Thukkani 09/20/2024  [name] had a cardioversion recently so he cannot come off Eliquis  for at least a month after that. If this procedure is elective then I would hold off for a few months. Thanks   Due to new or worsening symptoms, Markelle Najarian will require a follow-up visit for further pre-operative risk assessment. He has appt with Dr.Thukkani on 11/08/2024.  Pre-op  covering staff: - Please schedule appointment and call patient to inform them. If patient already had an upcoming appointment within acceptable timeframe, please add pre-op  clearance to the appointment notes so provider is aware. - Please contact requesting surgeon's office via preferred method (i.e, phone, fax) to inform them of need for appointment prior to surgery.  Lamarr Satterfield, NP 09/20/2024, 1:20 PM

## 2024-09-20 NOTE — Telephone Encounter (Signed)
 I will update the requesting office to see notes from preop APP Lamarr Satterfield, DNP pt has appt with Dr. Wendel 11/08/24 , which she has deferred clearance to appt with MD 11/08/24.

## 2024-09-20 NOTE — Telephone Encounter (Signed)
   Pre-operative Risk Assessment    Patient Name: Kyle Matthews  DOB: 03/16/1946 MRN: [id number]   Date of last office visit: 07/17/24 Date of next office visit: 10/04/24   Request for Surgical Clearance    Procedure:  Cholecystectomy  Date of Surgery:  Clearance TBD                                Surgeon:  Vicenta Poli, MD Surgeon's Group or Practice Name:  [facility] Phone number:  [phone] Fax number:  [phone]   Type of Clearance Requested:   - Medical  - Pharmacy:  Hold Apixaban  (Eliquis )     Type of Anesthesia:  General    Additional requests/questions:    Bonney Ival LOISE Gerome   09/20/2024, 11:15 AM

## 2024-09-20 NOTE — Telephone Encounter (Signed)
   Name: Kyle Matthews  DOB: 01-Nov-1946  MRN: [id number]  Primary Cardiologist: Arun K Thukkani, MD  Chart reviewed as part of pre-operative protocol coverage. The patient has an upcoming visit scheduled with Dr.Camnitz for Pacemaker check on 10/04/2024 and with Dr. Wendel  on 11/08/2024 at which time clearance can be addressed in case there are any issues that would impact surgical recommendations. He had recent DCCV on 09/17/2024  and was continued on Eliquis .   Cholecystectomy Is not scheduled until TBD as below. I added preop FYI to appointment note so that provider is aware to address at time of outpatient visit.  Per office protocol the cardiology provider should forward their finalized clearance decision and recommendations regarding antiplatelet therapy to the requesting party below.    This message will also be routed to Dr Inocencio for input on holding Eliquis  as requested below so that this information is available to the clearing provider at time of patient's appointment.   I will route this message as FYI to requesting party and remove this message from the preop box as separate preop APP input not needed at this time.   Please call with any questions.  Lamarr Satterfield, NP  09/20/2024, 11:22 AM

## 2024-09-25 ENCOUNTER — Other Ambulatory Visit: Payer: Self-pay

## 2024-09-25 ENCOUNTER — Encounter (HOSPITAL_COMMUNITY): Payer: Self-pay

## 2024-09-25 ENCOUNTER — Inpatient Hospital Stay (HOSPITAL_COMMUNITY)
Admission: EM | Admit: 2024-09-25 | Discharge: 2024-10-03 | DRG: 445 | Disposition: A | Discharge status: Home / Self Care | Attending: Internal Medicine | Admitting: Internal Medicine

## 2024-09-25 ENCOUNTER — Emergency Department (HOSPITAL_COMMUNITY)

## 2024-09-25 DIAGNOSIS — K2980 Duodenitis without bleeding: Secondary | ICD-10-CM

## 2024-09-25 DIAGNOSIS — K8000 Calculus of gallbladder with acute cholecystitis without obstruction: Secondary | ICD-10-CM | POA: Diagnosis present

## 2024-09-25 DIAGNOSIS — Z8673 Personal history of transient ischemic attack (TIA), and cerebral infarction without residual deficits: Secondary | ICD-10-CM | POA: Diagnosis not present

## 2024-09-25 DIAGNOSIS — Z953 Presence of xenogenic heart valve: Secondary | ICD-10-CM | POA: Diagnosis not present

## 2024-09-25 DIAGNOSIS — K819 Cholecystitis, unspecified: Principal | ICD-10-CM

## 2024-09-25 DIAGNOSIS — I5032 Chronic diastolic (congestive) heart failure: Secondary | ICD-10-CM | POA: Diagnosis present

## 2024-09-25 DIAGNOSIS — Z7982 Long term (current) use of aspirin: Secondary | ICD-10-CM

## 2024-09-25 DIAGNOSIS — I350 Nonrheumatic aortic (valve) stenosis: Secondary | ICD-10-CM | POA: Diagnosis present

## 2024-09-25 DIAGNOSIS — J449 Chronic obstructive pulmonary disease, unspecified: Secondary | ICD-10-CM | POA: Diagnosis present

## 2024-09-25 DIAGNOSIS — I110 Hypertensive heart disease with heart failure: Secondary | ICD-10-CM | POA: Diagnosis present

## 2024-09-25 DIAGNOSIS — N400 Enlarged prostate without lower urinary tract symptoms: Secondary | ICD-10-CM | POA: Diagnosis present

## 2024-09-25 DIAGNOSIS — Z823 Family history of stroke: Secondary | ICD-10-CM

## 2024-09-25 DIAGNOSIS — Z951 Presence of aortocoronary bypass graft: Secondary | ICD-10-CM | POA: Diagnosis not present

## 2024-09-25 DIAGNOSIS — Z950 Presence of cardiac pacemaker: Secondary | ICD-10-CM

## 2024-09-25 DIAGNOSIS — I4891 Unspecified atrial fibrillation: Secondary | ICD-10-CM | POA: Diagnosis present

## 2024-09-25 DIAGNOSIS — Z8249 Family history of ischemic heart disease and other diseases of the circulatory system: Secondary | ICD-10-CM | POA: Diagnosis not present

## 2024-09-25 DIAGNOSIS — E785 Hyperlipidemia, unspecified: Secondary | ICD-10-CM | POA: Diagnosis present

## 2024-09-25 DIAGNOSIS — Z952 Presence of prosthetic heart valve: Secondary | ICD-10-CM | POA: Diagnosis not present

## 2024-09-25 DIAGNOSIS — I079 Rheumatic tricuspid valve disease, unspecified: Secondary | ICD-10-CM | POA: Diagnosis present

## 2024-09-25 DIAGNOSIS — Z801 Family history of malignant neoplasm of trachea, bronchus and lung: Secondary | ICD-10-CM | POA: Diagnosis not present

## 2024-09-25 DIAGNOSIS — F17210 Nicotine dependence, cigarettes, uncomplicated: Secondary | ICD-10-CM | POA: Diagnosis present

## 2024-09-25 DIAGNOSIS — I480 Paroxysmal atrial fibrillation: Secondary | ICD-10-CM | POA: Diagnosis present

## 2024-09-25 DIAGNOSIS — R21 Rash and other nonspecific skin eruption: Secondary | ICD-10-CM | POA: Diagnosis not present

## 2024-09-25 DIAGNOSIS — Z8349 Family history of other endocrine, nutritional and metabolic diseases: Secondary | ICD-10-CM

## 2024-09-25 DIAGNOSIS — I2510 Atherosclerotic heart disease of native coronary artery without angina pectoris: Secondary | ICD-10-CM | POA: Diagnosis present

## 2024-09-25 DIAGNOSIS — K8020 Calculus of gallbladder without cholecystitis without obstruction: Secondary | ICD-10-CM | POA: Diagnosis present

## 2024-09-25 DIAGNOSIS — I10 Essential (primary) hypertension: Secondary | ICD-10-CM | POA: Diagnosis not present

## 2024-09-25 DIAGNOSIS — K82A2 Perforation of gallbladder in cholecystitis: Secondary | ICD-10-CM | POA: Diagnosis present

## 2024-09-25 DIAGNOSIS — I252 Old myocardial infarction: Secondary | ICD-10-CM

## 2024-09-25 DIAGNOSIS — K810 Acute cholecystitis: Secondary | ICD-10-CM | POA: Diagnosis present

## 2024-09-25 DIAGNOSIS — K551 Chronic vascular disorders of intestine: Secondary | ICD-10-CM | POA: Diagnosis present

## 2024-09-25 DIAGNOSIS — I4892 Unspecified atrial flutter: Secondary | ICD-10-CM | POA: Diagnosis present

## 2024-09-25 DIAGNOSIS — Z800 Family history of malignant neoplasm of digestive organs: Secondary | ICD-10-CM

## 2024-09-25 DIAGNOSIS — E876 Hypokalemia: Secondary | ICD-10-CM | POA: Diagnosis present

## 2024-09-25 DIAGNOSIS — Z79899 Other long term (current) drug therapy: Secondary | ICD-10-CM | POA: Diagnosis not present

## 2024-09-25 DIAGNOSIS — Z7901 Long term (current) use of anticoagulants: Secondary | ICD-10-CM

## 2024-09-25 DIAGNOSIS — K5730 Diverticulosis of large intestine without perforation or abscess without bleeding: Secondary | ICD-10-CM | POA: Diagnosis present

## 2024-09-25 DIAGNOSIS — Z01810 Encounter for preprocedural cardiovascular examination: Secondary | ICD-10-CM | POA: Diagnosis not present

## 2024-09-25 DIAGNOSIS — Z9079 Acquired absence of other genital organ(s): Secondary | ICD-10-CM

## 2024-09-25 DIAGNOSIS — Z954 Presence of other heart-valve replacement: Secondary | ICD-10-CM | POA: Diagnosis not present

## 2024-09-25 LAB — COMPREHENSIVE METABOLIC PANEL WITH GFR
ALT: 12 U/L (ref 0–44)
AST: 22 U/L (ref 15–41)
Albumin: 3.9 g/dL (ref 3.5–5.0)
Alkaline Phosphatase: 95 U/L (ref 38–126)
Anion gap: 10 (ref 5–15)
BUN: 24 mg/dL — ABNORMAL HIGH (ref 8–23)
CO2: 24 mmol/L (ref 22–32)
Calcium: 9.1 mg/dL (ref 8.9–10.3)
Chloride: 103 mmol/L (ref 98–111)
Creatinine, Ser: 1.19 mg/dL (ref 0.61–1.24)
GFR, Estimated: >60 mL/min (ref >60)
Glucose, Bld: 100 mg/dL — ABNORMAL HIGH (ref 70–99)
Potassium: 4 mmol/L (ref 3.5–5.1)
Sodium: 137 mmol/L (ref 135–145)
Total Bilirubin: 0.3 mg/dL (ref 0.0–1.2)
Total Protein: 7.1 g/dL (ref 6.5–8.1)

## 2024-09-25 LAB — CBG MONITORING, ED: Glucose-Capillary: 179 mg/dL — ABNORMAL HIGH (ref 70–99)

## 2024-09-25 LAB — URINALYSIS, ROUTINE W REFLEX MICROSCOPIC
Bacteria, UA: NONE SEEN
Bilirubin Urine: NEGATIVE
Glucose, UA: NEGATIVE mg/dL
Hgb urine dipstick: NEGATIVE
Ketones, ur: NEGATIVE mg/dL
Leukocytes,Ua: NEGATIVE
Nitrite: NEGATIVE
Protein, ur: 30 mg/dL — AB
Specific Gravity, Urine: 1.017 (ref 1.005–1.030)
pH: 6 (ref 5.0–8.0)

## 2024-09-25 LAB — CBC
HCT: 37 % — ABNORMAL LOW (ref 39.0–52.0)
Hemoglobin: 11.5 g/dL — ABNORMAL LOW (ref 13.0–17.0)
MCH: 26.3 pg (ref 26.0–34.0)
MCHC: 31.1 g/dL (ref 30.0–36.0)
MCV: 84.7 fL (ref 80.0–100.0)
Platelets: 184 K/uL (ref 150–400)
RBC: 4.37 MIL/uL (ref 4.22–5.81)
RDW: 15.9 % — ABNORMAL HIGH (ref 11.5–15.5)
WBC: 9.2 K/uL (ref 4.0–10.5)
nRBC: 0 % (ref 0.0–0.2)

## 2024-09-25 LAB — TROPONIN T, HIGH SENSITIVITY: Troponin T High Sensitivity: 16 ng/L (ref 0–19)

## 2024-09-25 LAB — LIPASE, BLOOD: Lipase: 24 U/L (ref 11–51)

## 2024-09-25 MED ORDER — METRONIDAZOLE 500 MG PO TABS
500.0000 mg | ORAL_TABLET | Freq: Once | ORAL | Status: AC
  Administered 2024-09-26: 500 mg via ORAL
  Filled 2024-09-25: qty 1

## 2024-09-25 MED ORDER — CEFTRIAXONE SODIUM 2 G IJ SOLR
2.0000 g | Freq: Once | INTRAMUSCULAR | Status: AC
  Administered 2024-09-26: 2 g via INTRAVENOUS
  Filled 2024-09-25: qty 20

## 2024-09-25 MED ORDER — IOHEXOL 300 MG/ML  SOLN
100.0000 mL | Freq: Once | INTRAMUSCULAR | Status: AC | PRN
  Administered 2024-09-25: 100 mL via INTRAVENOUS

## 2024-09-25 NOTE — ED Notes (Signed)
 CBG in this pt chart is not from this pt. There was a mix up with stickers and armbands at registration.

## 2024-09-25 NOTE — ED Provider Notes (Signed)
 Care assumed at shift change. Recent admission for cholecystitis, managed medically due to cardiac co-morbidities. Here for return of abdominal pain. CT concerning for inflammatory changes, cholecystitis vs duodenitis. Will give Abx and plan admission to medicine.  Physical Exam  BP (!) 140/83 (BP Location: Right Arm)   Pulse 86   Temp 98.3 F (36.8 C) (Oral)   Resp 16   Wt 70.3 kg   SpO2 97%   BMI 27.46 kg/m   Physical Exam  Procedures  Procedures  ED Course / MDM   Clinical Course as of 09/25/24 2348  Wed Sep 25, 2024  2347 Spoke with Dr. Charlton, Hospitalist, who will evaluate for admission.  [CS]    Clinical Course User Index [CS] Roselyn Carlin NOVAK, MD   Medical Decision Making Problems Addressed: Cholecystitis: acute illness or injury  Risk Prescription drug management. Decision regarding hospitalization.          Roselyn Carlin NOVAK, MD 09/25/24 [phone]

## 2024-09-25 NOTE — ED Triage Notes (Signed)
 Pt reports onset of upper abd pain with vomiting this evening.  Pt reports it feels like it did when he had a gall bladder attack 3 weeks ago.

## 2024-09-25 NOTE — ED Provider Notes (Signed)
 [facility] Provider Note   CSN: [id number] Arrival date & time: 09/25/24  2011     Patient presents with: Abdominal Pain   Kyle Matthews is a 78 y.o. male w/ hx of HTN , HLD, CAD s/p CABG, AVR, TVR, A Fib on eliquis , heart block s/p pacemaker, CVA, BPH here with nausea/vomiting abdominal pain.  Onset around 5 pm this evening.  Admitted for similar 1 month ago syymptoms with concern for cholecystitis on CT imaging, treated with IV antibitoics, surgery deferred due to concern for cardiology comorbidities  {Add pertinent medical, surgical, social history, OB history to HPI:32947} HPI     Prior to Admission medications   Medication Sig Start Date End Date Taking? Authorizing Provider  amiodarone  (PACERONE ) 200 MG tablet Take 1 tablet (200 mg total) by mouth daily. 08/13/24   Fenton, Clint R, PA  amLODipine  (NORVASC ) 5 MG tablet Take 0.5 tablets (2.5 mg total) by mouth daily. 08/21/24   Pearlean Manus, MD  apixaban  (ELIQUIS ) 5 MG TABS tablet Take 1 tablet (5 mg total) by mouth 2 (two) times daily. 09/05/24   Fenton, Clint R, PA  aspirin  81 MG chewable tablet Chew 81 mg by mouth daily. Patient not taking: Reported on 09/11/2024 09/07/24   [provider]  finasteride  (PROSCAR ) 5 MG tablet Take 5 mg by mouth every evening.    [provider]  furosemide  (LASIX ) 40 MG tablet Take 1 tablet (40 mg total) by mouth daily. 08/21/24   Pearlean Manus, MD  nitroGLYCERIN  (NITROSTAT ) 0.4 MG SL tablet Place 1 tablet (0.4 mg total) under the tongue every 5 (five) minutes as needed for chest pain. 10/19/23   Thukkani, Arun K, MD  oxyCODONE  (OXYCONTIN ) 10 mg 12 hr tablet 1 tablet Orally every 12 hrs As needed    [provider]  pantoprazole  (PROTONIX ) 40 MG tablet Take 1 tablet (40 mg total) by mouth daily. 08/21/24 08/21/25  Pearlean Manus, MD  potassium chloride  SA (KLOR-CON  M) 20 MEQ tablet Take 1 tablet (20 mEq total) by mouth daily. X 7  days, then change to only days you take a Lasix  07/09/24   Barrett, Erin R, PA-C  rosuvastatin  (CRESTOR ) 10 MG tablet Take 1 tablet (10 mg total) by mouth daily. Patient taking differently: Take 40 mg by mouth daily. 07/04/24   Barrett, Erin R, PA-C  senna-docusate (SENOKOT-S) 8.6-50 MG tablet Take 2 tablets by mouth at bedtime. 08/21/24 08/21/25  Pearlean Manus, MD    Allergies: Tetanus toxoid-containing vaccines and Zetia  [ezetimibe ]    Review of Systems  Updated Vital Signs BP (!) 170/109 (BP Location: Right Arm)   Pulse 94   Temp 97.8 F (36.6 C) (Oral)   Resp (!) 21   Wt 70.3 kg   SpO2 98%   BMI 27.46 kg/m   Physical Exam Constitutional:      General: He is not in acute distress. HENT:     Head: Normocephalic and atraumatic.  Eyes:     Conjunctiva/sclera: Conjunctivae normal.     Pupils: Pupils are equal, round, and reactive to light.  Cardiovascular:     Rate and Rhythm: Normal rate and regular rhythm.  Pulmonary:     Effort: Pulmonary effort is normal. No respiratory distress.  Abdominal:     General: There is no distension.     Tenderness: There is generalized abdominal tenderness.  Skin:    General: Skin is warm and dry.  Neurological:     General: No  focal deficit present.     Mental Status: He is alert. Mental status is at baseline.  Psychiatric:        Mood and Affect: Mood normal.        Behavior: Behavior normal.     (all labs ordered are listed, but only abnormal results are displayed) Labs Reviewed  CBC - Abnormal; Notable for the following components:      Result Value   Hemoglobin 11.5 (*)    HCT 37.0 (*)    RDW 15.9 (*)    All other components within normal limits  CBG MONITORING, ED - Abnormal; Notable for the following components:   Glucose-Capillary 179 (*)    All other components within normal limits  LIPASE, BLOOD  COMPREHENSIVE METABOLIC PANEL WITH GFR  URINALYSIS, ROUTINE W REFLEX MICROSCOPIC  TROPONIN T, HIGH SENSITIVITY     EKG: None  Radiology: No results found.  {Document cardiac monitor, telemetry assessment procedure when appropriate:32947} Procedures   Medications Ordered in the ED - No data to display    {Click here for ABCD2, HEART and other calculators REFRESH Note before signing:1}                              Medical Decision Making Amount and/or Complexity of Data Reviewed Labs: ordered. Radiology: ordered. ECG/medicine tests: ordered.   This patient presents to the ED with concern for abdominal pain, epigastric discomfort. This involves an extensive number of treatment options, and is a complaint that carries with it a high risk of complications and morbidity.  The differential diagnosis includes gastritis vs biliary disease vs pancreatitis vs colitis vs other  Co-morbidities that complicate the patient evaluation: hx of gallstones  Additional history obtained from wife  External records from outside source obtained and reviewed including hosp discharge records last month  I ordered and personally interpreted labs.  The pertinent results include:  ***  I ordered imaging studies including CT abdomen pelvis with contrast I independently visualized and interpreted imaging which showed *** I agree with the radiologist interpretation  The patient was maintained on a cardiac monitor.  I personally viewed and interpreted the cardiac monitored which showed an underlying rhythm of: ***  Per my interpretation the patient's ECG shows ***  I ordered medication including ***  for *** I have reviewed the patients home medicines and have made adjustments as needed  Test Considered: ***  I requested consultation with the ***,  and discussed lab and imaging findings as well as pertinent plan - they recommend: ***  After the interventions noted above, I reevaluated the patient and found that they have: {resolved/improved/worsened:23923::improved}  Social Determinants of  Health:***  Dispostion:  After consideration of the diagnostic results and the patients response to treatment, I feel that the patent would benefit from ***.   {Document critical care time when appropriate  Document review of labs and clinical decision tools ie CHADS2VASC2, etc  Document your independent review of radiology images and any outside records  Document your discussion with family members, caretakers and with consultants  Document social determinants of health affecting pt's care  Document your decision making why or why not admission, treatments were needed:32947:::1}   Final diagnoses:  None    ED Discharge Orders     None

## 2024-09-26 ENCOUNTER — Inpatient Hospital Stay (HOSPITAL_COMMUNITY)

## 2024-09-26 ENCOUNTER — Encounter (HOSPITAL_COMMUNITY): Payer: Self-pay | Admitting: Family Medicine

## 2024-09-26 DIAGNOSIS — Z7901 Long term (current) use of anticoagulants: Secondary | ICD-10-CM

## 2024-09-26 DIAGNOSIS — Z951 Presence of aortocoronary bypass graft: Secondary | ICD-10-CM

## 2024-09-26 DIAGNOSIS — Z01810 Encounter for preprocedural cardiovascular examination: Secondary | ICD-10-CM | POA: Diagnosis not present

## 2024-09-26 DIAGNOSIS — I4891 Unspecified atrial fibrillation: Secondary | ICD-10-CM | POA: Diagnosis not present

## 2024-09-26 DIAGNOSIS — Z952 Presence of prosthetic heart valve: Secondary | ICD-10-CM | POA: Diagnosis not present

## 2024-09-26 DIAGNOSIS — K8020 Calculus of gallbladder without cholecystitis without obstruction: Secondary | ICD-10-CM | POA: Diagnosis not present

## 2024-09-26 DIAGNOSIS — I2510 Atherosclerotic heart disease of native coronary artery without angina pectoris: Secondary | ICD-10-CM | POA: Diagnosis not present

## 2024-09-26 DIAGNOSIS — Z954 Presence of other heart-valve replacement: Secondary | ICD-10-CM

## 2024-09-26 DIAGNOSIS — I5032 Chronic diastolic (congestive) heart failure: Secondary | ICD-10-CM | POA: Diagnosis not present

## 2024-09-26 LAB — COMPREHENSIVE METABOLIC PANEL WITH GFR
ALT: 12 U/L (ref 0–44)
AST: 20 U/L (ref 15–41)
Albumin: 3.6 g/dL (ref 3.5–5.0)
Alkaline Phosphatase: 92 U/L (ref 38–126)
Anion gap: 9 (ref 5–15)
BUN: 15 mg/dL (ref 8–23)
CO2: 24 mmol/L (ref 22–32)
Calcium: 9 mg/dL (ref 8.9–10.3)
Chloride: 104 mmol/L (ref 98–111)
Creatinine, Ser: 0.82 mg/dL (ref 0.61–1.24)
GFR, Estimated: >60 mL/min (ref >60)
Glucose, Bld: 103 mg/dL — ABNORMAL HIGH (ref 70–99)
Potassium: 3.8 mmol/L (ref 3.5–5.1)
Sodium: 137 mmol/L (ref 135–145)
Total Bilirubin: 0.3 mg/dL (ref 0.0–1.2)
Total Protein: 6.7 g/dL (ref 6.5–8.1)

## 2024-09-26 LAB — CBC
HCT: 35.1 % — ABNORMAL LOW (ref 39.0–52.0)
Hemoglobin: 10.9 g/dL — ABNORMAL LOW (ref 13.0–17.0)
MCH: 26.2 pg (ref 26.0–34.0)
MCHC: 31.1 g/dL (ref 30.0–36.0)
MCV: 84.4 fL (ref 80.0–100.0)
Platelets: 161 K/uL (ref 150–400)
RBC: 4.16 MIL/uL — ABNORMAL LOW (ref 4.22–5.81)
RDW: 15.8 % — ABNORMAL HIGH (ref 11.5–15.5)
WBC: 9.2 K/uL (ref 4.0–10.5)
nRBC: 0 % (ref 0.0–0.2)

## 2024-09-26 LAB — MAGNESIUM: Magnesium: 2.2 mg/dL (ref 1.7–2.4)

## 2024-09-26 LAB — HEPARIN LEVEL (UNFRACTIONATED): Heparin Unfractionated: 0.69 [IU]/mL (ref 0.30–0.70)

## 2024-09-26 LAB — APTT
aPTT: 38 s — ABNORMAL HIGH (ref 24–36)
aPTT: 110 s — ABNORMAL HIGH (ref 24–36)

## 2024-09-26 MED ORDER — OXYCODONE HCL 5 MG PO TABS
5.0000 mg | ORAL_TABLET | ORAL | Status: DC | PRN
  Administered 2024-09-27 – 2024-10-01 (×8): 5 mg via ORAL
  Filled 2024-09-26 (×9): qty 1

## 2024-09-26 MED ORDER — ACETAMINOPHEN 650 MG RE SUPP: 650.0000 mg | Freq: Four times a day (QID) | RECTAL | Status: DC | PRN

## 2024-09-26 MED ORDER — AMIODARONE HCL 200 MG PO TABS
200.0000 mg | ORAL_TABLET | Freq: Two times a day (BID) | ORAL | Status: DC
  Administered 2024-09-26 – 2024-10-03 (×15): 200 mg via ORAL
  Filled 2024-09-26 (×15): qty 1

## 2024-09-26 MED ORDER — HEPARIN (PORCINE) 25000 UT/250ML-% IV SOLN
1250.0000 [IU]/h | INTRAVENOUS | Status: DC
  Administered 2024-09-26: 1000 [IU]/h via INTRAVENOUS
  Administered 2024-09-27: 1200 [IU]/h via INTRAVENOUS
  Administered 2024-09-28: 1000 [IU]/h via INTRAVENOUS
  Administered 2024-09-29: 1200 [IU]/h via INTRAVENOUS
  Administered 2024-10-01: 1150 [IU]/h via INTRAVENOUS
  Administered 2024-10-02: 1200 [IU]/h via INTRAVENOUS
  Administered 2024-10-03: 1250 [IU]/h via INTRAVENOUS
  Filled 2024-09-26 (×8): qty 250

## 2024-09-26 MED ORDER — HEPARIN BOLUS VIA INFUSION
2000.0000 [IU] | Freq: Once | INTRAVENOUS | Status: AC
  Administered 2024-09-26: 2000 [IU] via INTRAVENOUS

## 2024-09-26 MED ORDER — PIPERACILLIN-TAZOBACTAM 3.375 G IVPB
3.3750 g | Freq: Three times a day (TID) | INTRAVENOUS | Status: DC
  Administered 2024-09-26 – 2024-10-03 (×22): 3.375 g via INTRAVENOUS
  Filled 2024-09-26 (×24): qty 50

## 2024-09-26 MED ORDER — PANTOPRAZOLE SODIUM 40 MG PO TBEC: 40.0000 mg | DELAYED_RELEASE_TABLET | Freq: Every day | ORAL | Status: DC

## 2024-09-26 MED ORDER — FENTANYL CITRATE (PF) 100 MCG/2ML IJ SOLN: 12.5000 ug | INTRAMUSCULAR | Status: DC | PRN

## 2024-09-26 MED ORDER — ROSUVASTATIN CALCIUM 20 MG PO TABS
40.0000 mg | ORAL_TABLET | Freq: Every day | ORAL | Status: DC
  Administered 2024-09-26 – 2024-10-03 (×8): 40 mg via ORAL
  Filled 2024-09-26 (×8): qty 2

## 2024-09-26 MED ORDER — PANTOPRAZOLE SODIUM 40 MG PO TBEC
40.0000 mg | DELAYED_RELEASE_TABLET | Freq: Two times a day (BID) | ORAL | Status: DC
Start: 2024-09-26 — End: 2024-10-03
  Administered 2024-09-26 – 2024-10-03 (×14): 40 mg via ORAL
  Filled 2024-09-26 (×14): qty 1

## 2024-09-26 MED ORDER — FENTANYL CITRATE (PF) 50 MCG/ML IJ SOSY
12.5000 ug | PREFILLED_SYRINGE | INTRAMUSCULAR | Status: DC | PRN
  Administered 2024-09-26 – 2024-09-30 (×3): 50 ug via INTRAVENOUS
  Filled 2024-09-26 (×3): qty 1

## 2024-09-26 MED ORDER — SODIUM CHLORIDE 0.9% FLUSH
3.0000 mL | Freq: Two times a day (BID) | INTRAVENOUS | Status: DC
  Administered 2024-09-26 – 2024-10-03 (×12): 3 mL via INTRAVENOUS

## 2024-09-26 MED ORDER — AMLODIPINE BESYLATE 5 MG PO TABS
5.0000 mg | ORAL_TABLET | Freq: Every day | ORAL | Status: DC
  Administered 2024-09-26 – 2024-10-03 (×8): 5 mg via ORAL
  Filled 2024-09-26 (×8): qty 1

## 2024-09-26 MED ORDER — FINASTERIDE 5 MG PO TABS
5.0000 mg | ORAL_TABLET | Freq: Every evening | ORAL | Status: DC
  Administered 2024-09-26 – 2024-10-02 (×7): 5 mg via ORAL
  Filled 2024-09-26 (×8): qty 1

## 2024-09-26 MED ORDER — ACETAMINOPHEN 325 MG PO TABS: 650.0000 mg | ORAL_TABLET | Freq: Four times a day (QID) | ORAL | Status: DC | PRN

## 2024-09-26 MED ORDER — PROCHLORPERAZINE EDISYLATE 10 MG/2ML IJ SOLN
5.0000 mg | Freq: Four times a day (QID) | INTRAMUSCULAR | Status: DC | PRN
  Administered 2024-09-26: 5 mg via INTRAVENOUS
  Filled 2024-09-26: qty 2

## 2024-09-26 NOTE — Progress Notes (Signed)
 PROGRESS NOTE    Kyle Matthews  FMW:[id number] DOB: 1946-07-18 DOA: 09/25/2024 PCP: Espinoza, Alejandra, DO  Subjective: Patient seen and examined.  Still has tenderness in his right upper quadrant.  Was seen by [facility] surgery on September 20, 2024 by Dr. Georgette Poli.  Patient states that he is going to be booked for surgery next month.  He had worsening abdominal pain and came to the ER.  General surgery has been consulted.  Dr. Kallie has seen the patient.    Hospital Course: CC: abd pain, N/V HPI: Kyle Matthews is a 78 y.o. male with medical history significant for hypertension, hyperlipidemia, CAD status post CABG, bioprosthetic AVR, TVR, atrial fibrillation and flutter on Eliquis , history of CVA, COPD, and BPH status post prostatectomy who presents with acute onset upper abdominal pain, nausea, and vomiting.   Patient was in his usual state until this evening when he developed acute onset of back pain and initially which was followed closely by nausea with nonbloody vomiting and then pain in the upper abdomen.  He had not eaten anything for several hours prior to this and is unable to identify any alleviating or exacerbating factors.  Symptoms are similar to what he presented with in September 2025.  He is followed by surgery for symptomatic cholelithiasis but will require cardiac clearance prior to surgery.  Additionally, cardiology has advised that he does not come off of anticoagulation for at least a month following his recent cardioversion.   ED Course: Upon arrival to the ED, patient is found to be afebrile and saturating well on room air with normal RR, normal HR, and stable BP.  Labs are most notable for normal LFTs, normal lipase, and normal WBC.  CT demonstrates cholelithiasis with stable prominence of the CBD and increased inflammatory changes in the right upper quadrant.   Patient was started on Rocephin  and Flagyl in the ED.  Significant Events: Admitted 09/25/2024 for  symptomatic cholelithiasis   Admission Labs: WBC 9.2, HgB 11.5, plt 184 Na 137, K 4.0, CO2 of 24, BUN 24, Scr 1.19, glu 100 T. Prot 7.1, alb 3.9, AST 22, ALT 12, alk phos 95, t. Bili 0.3 Lipase 24 UA 30 protein, otherwise negative  Admission Imaging Studies: CT abd/pelvis Cholelithiasis. Prominence of the common bile duct is again identified and stable. Inflammatory changes in the right upper quadrant near the common bile duct and second portion of the duodenum. These have increased in the interval from the prior exam and again may represent acute cholecystitis although the possibility of duodenitis deserves consideration as well. Diverticulosis without diverticulitis. Bladder wall thickening stable from the prior exam which may represent cystitis.  Significant Labs:   Significant Imaging Studies:   Antibiotic Therapy: Anti-infectives (From admission, onward)    Start     Dose/Rate Route Frequency Ordered Stop   09/26/24 0600  piperacillin -tazobactam (ZOSYN ) IVPB 3.375 g        3.375 g 12.5 mL/hr over 240 Minutes Intravenous Every 8 hours 09/26/24 0207     09/26/24 0000  metroNIDAZOLE (FLAGYL) tablet 500 mg        500 mg Oral  Once 09/25/24 2348 09/26/24 0058   09/25/24 2345  cefTRIAXone  (ROCEPHIN ) 2 g in sodium chloride  0.9 % 100 mL IVPB        2 g 200 mL/hr over 30 Minutes Intravenous  Once 09/25/24 2341 09/26/24 0054       Procedures:   Consultants: General surgery    Assessment and Plan: * Symptomatic  cholelithiasis 09/26/24 General Surgery consulted.  Continue with IV antibiotics.  Follow-up on right upper quadrant ultrasound.  **update. RUQ U/S shows Cholelithiasis with mild gallbladder wall thickening. 2. Possible distal common bile duct dilatation; consider MRCP for further evaluation if clinically warranted. 3. 3.1 x 2.6 x 2.9 cm right hepatic lobe cystic lesion, which may represent adjacent simple cysts versus a septated cystic lesion such as  cystadenoma  Will defer to general surgery to manage his cholelithiasis.  Atrial fibrillation and flutter (HCC) 09/26/24 was recently cardioverted on September 13, 2024.  Was on Eliquis  post cardioversion.  Now on IV heparin .   Chronic heart failure with preserved ejection fraction (HFpEF) (HCC) 09/26/24 Not currently exacerbated.  Appears euvolemic.   Coronary artery disease 09/26/24 stable.  On Crestor .   Essential hypertension 09/26/24 BP stable for now.  Continue Norvasc .   DVT prophylaxis:   IV Heparin    Code Status: Full Code Family Communication: discussed with pt and wife at bedside Disposition Plan: return home Reason for continuing need for hospitalization: remains on IV ABX and IV heparin .  Objective: Vitals:   09/26/24 0308 09/26/24 0310 09/26/24 0400 09/26/24 1046  BP:   124/76   Pulse: 94 92 93   Resp:   18   Temp: 98.8 F (37.1 C)   98.7 F (37.1 C)  TempSrc: Oral   Oral  SpO2:  96% 94%   Weight:      Height:        Intake/Output Summary (Last 24 hours) at 09/26/2024 1110 Last data filed at 09/26/2024 0933 Gross per 24 hour  Intake --  Output 600 ml  Net -600 ml   Filed Weights   09/25/24 2034  Weight: 70.3 kg    Examination:  Physical Exam Vitals and nursing note reviewed.  HENT:     Head: Normocephalic and atraumatic.     Nose: Nose normal.  Cardiovascular:     Rate and Rhythm: Normal rate and regular rhythm.     Heart sounds: Murmur heard.  Pulmonary:     Effort: Pulmonary effort is normal. No respiratory distress.     Breath sounds: Normal breath sounds. No wheezing.  Abdominal:     General: Bowel sounds are normal.     Tenderness: There is abdominal tenderness in the right upper quadrant and epigastric area.  Musculoskeletal:     Right lower leg: No edema.     Left lower leg: No edema.  Skin:    General: Skin is warm and dry.     Capillary Refill: Capillary refill takes less than 2 seconds.  Neurological:     Mental  Status: He is alert and oriented to person, place, and time.     Data Reviewed: I have personally reviewed following labs and imaging studies  CBC: Recent Labs  Lab 09/25/24 2056 09/26/24 0430  WBC 9.2 9.2  HGB 11.5* 10.9*  HCT 37.0* 35.1*  MCV 84.7 84.4  PLT 184 161   Basic Metabolic Panel: Recent Labs  Lab 09/25/24 2056 09/26/24 0430  NA 137 137  K 4.0 3.8  CL 103 104  CO2 24 24  GLUCOSE 100* 103*  BUN 24* 15  CREATININE 1.19 0.82  CALCIUM  9.1 9.0  MG  --  2.2   GFR: Estimated Creatinine Clearance: 67.5 mL/min (by C-G formula based on SCr of 0.82 mg/dL). Liver Function Tests: Recent Labs  Lab 09/25/24 2056 09/26/24 0430  AST 22 20  ALT 12 12  ALKPHOS 95 92  BILITOT 0.3 0.3  PROT 7.1 6.7  ALBUMIN  3.9 3.6   Recent Labs  Lab 09/25/24 2056  LIPASE 24   BNP (last 3 results) Recent Labs    10/03/23 0412 10/04/23 0509 06/12/24 1819  BNP 547.0* 501.3* 169.0*   CBG: Recent Labs  Lab 09/25/24 2100  GLUCAP 179*   Radiology Studies: US  Abdomen Limited RUQ (LIVER/GB) Result Date: 09/26/2024 EXAM: Right Upper Quadrant Abdominal Ultrasound TECHNIQUE: Real-time ultrasonography of the right upper quadrant of the abdomen was performed. COMPARISON: None. CLINICAL HISTORY: Symptomatic cholelithiasis. FINDINGS: LIVER: The liver demonstrates normal echogenicity. No intrahepatic biliary ductal dilatation. No mass. A 3.1 x 2.6 x 2.9 cm lesion is present in the right hepatic lobe, demonstrating fluid echogenicity, which may represent 2 adjacent cysts or a cystic lesion with a small single internal septation, such as cystadenoma. BILIARY SYSTEM: Numerous layering gallstones are filling much of the gallbladder. Mild gallbladder wall thickening is present, measuring 0.3 cm. The common bile duct measures 0.4 cm proximally and may be dilated distally, although this appearance of dilation distally may be due to the confluence of a low-attaching cystic duct with the common  hepatic duct; this could be better characterized at MRI if clinically warranted. OTHER: No right upper quadrant ascites. IMPRESSION: 1. Cholelithiasis with mild gallbladder wall thickening. 2. Possible distal common bile duct dilatation; consider MRCP for further evaluation if clinically warranted. 3. 3.1 x 2.6 x 2.9 cm right hepatic lobe cystic lesion, which may represent adjacent simple cysts versus a septated cystic lesion such as cystadenoma. Electronically signed by: Ryan Salvage MD 09/26/2024 10:18 AM EDT RP Workstation: [id number]   CT ABDOMEN PELVIS W CONTRAST Result Date: 09/25/2024 CLINICAL DATA:  Acute onset upper abdominal pain, initial encounter EXAM: CT ABDOMEN AND PELVIS WITH CONTRAST TECHNIQUE: Multidetector CT imaging of the abdomen and pelvis was performed using the standard protocol following bolus administration of intravenous contrast. RADIATION DOSE REDUCTION: This exam was performed according to the departmental dose-optimization program which includes automated exposure control, adjustment of the mA and/or kV according to patient size and/or use of iterative reconstruction technique. CONTRAST:  OMNIPAQUE  IOHEXOL  300 MG/ML  SOLN COMPARISON:  08/21/2024 ultrasound, CT from 08/20/2024 FINDINGS: Lower chest: No acute abnormality. Hepatobiliary: Scattered cysts are noted within the liver. The gallbladder is well distended with multiple small gallstones. Persistent inflammatory changes noted surrounding the common bile duct and central gallbladder as well as the second portion of the duodenum. This is increased when compared with the prior exam. Pancreas: Pancreas is within normal limits. Spleen: Normal in size without focal abnormality. Calcified granulomas are again seen. Adrenals/Urinary Tract: Adrenal glands are within normal limits. Partially calcified cysts are noted within the kidneys bilaterally stable from the prior exam. No follow-up is recommended. No renal calculi are  seen. No obstructive changes are noted. The bladder is partially distended. Some wall thickening of the bladder is noted stable from the prior exam. Stomach/Bowel: Scattered diverticular change of the colon is noted without evidence of diverticulitis. The appendix is within normal limits. Stomach is unremarkable. Inflammatory changes surrounding the second portion of the duodenum are seen. Vascular/Lymphatic: Aortic atherosclerosis. No enlarged abdominal or pelvic lymph nodes. Reproductive: Prostate is unremarkable. Other: No abdominal wall hernia or abnormality. No abdominopelvic ascites. Musculoskeletal: No acute or significant osseous findings. IMPRESSION: Cholelithiasis. Prominence of the common bile duct is again identified and stable. Inflammatory changes in the right upper quadrant near the common bile duct and second portion of the duodenum. These have increased  in the interval from the prior exam and again may represent acute cholecystitis although the possibility of duodenitis deserves consideration as well. Diverticulosis without diverticulitis. Bladder wall thickening stable from the prior exam which may represent cystitis. Electronically Signed   By: Oneil Devonshire M.D.   On: 09/25/2024 23:30    Scheduled Meds:  amiodarone   200 mg Oral BID   amLODipine   5 mg Oral Daily   finasteride   5 mg Oral QPM   pantoprazole   40 mg Oral BID   rosuvastatin   40 mg Oral Daily   sodium chloride  flush  3 mL Intravenous Q12H   Continuous Infusions:  heparin  Stopped (09/26/24 0549)   piperacillin -tazobactam (ZOSYN )  IV 3.375 g (09/26/24 0551)     LOS: 1 day   Time spent: 60 minutes  Camellia Door, DO  [facility]  09/26/2024, 11:10 AM

## 2024-09-26 NOTE — Hospital Course (Addendum)
 CC: abd pain, N/V HPI: Kyle Matthews is a 78 y.o. male with medical history significant for hypertension, hyperlipidemia, CAD status post CABG, bioprosthetic AVR, TVR, atrial fibrillation and flutter on Eliquis , history of CVA, COPD, and BPH status post prostatectomy who presented on 09/25/2024 with acute onset upper abdominal pain, nausea, and vomiting.   Patient was in his usual state until the evening when he developed acute onset of back pain and initially which was followed closely by nausea with nonbloody vomiting and then pain in the upper abdomen.  He had not eaten anything for several hours prior to this and was unable to identify any alleviating or exacerbating factors.  Symptoms were similar to what he presented with in September 2025.  He is followed by surgery for symptomatic cholelithiasis but will require cardiac clearance prior to surgery.  Additionally, cardiology has advised that he does not come off of anticoagulation for at least a month following his recent cardioversion.   ED Course: Upon arrival to the ED, patient is found to be afebrile and saturating well on room air with normal RR, normal HR, and stable BP.  Labs are most notable for normal LFTs, normal lipase, and normal WBC.  CT demonstrates cholelithiasis with stable prominence of the CBD and increased inflammatory changes in the right upper quadrant.   Patient was started on Rocephin  and Flagyl in the ED.  Admission Imaging Studies: CT abd/pelvis Cholelithiasis. Prominence of the common bile duct is again identified and stable. Inflammatory changes in the right upper quadrant near the common bile duct and second portion of the duodenum. These have increased in the interval from the prior exam and again may represent acute cholecystitis although the possibility of duodenitis deserves consideration as well. Diverticulosis without diverticulitis. Bladder wall thickening stable from the prior exam which may represent cystitis.

## 2024-09-26 NOTE — Plan of Care (Signed)
   Problem: Clinical Measurements: Goal: Ability to maintain clinical measurements within normal limits will improve Outcome: Progressing

## 2024-09-26 NOTE — Consult Note (Signed)
 CARDIOLOGY CONSULT NOTE    Patient ID: Kyle Matthews; [id number]; [birth year]   Admit date: 09/25/2024 Date of Consult: 09/26/2024  Primary Care Provider: [name], DO Primary Cardiologist:  Primary Electrophysiologist:    History of Present Illness:   Mr. 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  He presented with a chief complaint of chest pain, abdominal pain.  Cardiology is consulted for preop cardiac risk stratification for cholecystectomy.  He recently underwent cardioversion for atrial flutter on 09/13/2024 at [facility].  He previously had A-fib, was on amiodarone  therapy chronically.  Later on his device interrogation he was noted to be in atrial flutter since June/July 2025 and had to be cardioverted due to symptoms.  Currently on amiodarone  200 mg twice daily and heparin  drip.  Also on Zosyn  for the management of cholecystitis.  Past Medical History:  Diagnosis Date   Anemia    Anxiety    Arthritis    BPH (benign prostatic hyperplasia)    Status post biopsy in 2009. - w/ LUTS   Coronary artery disease    a. s/p CABG in 10/2023 with LIMA-LAD and SVG-Ramus at the time of AVR   Depression    Essential hypertension    Hyperlipidemia    On Crestor  - followed by PCP   Moderate calcific aortic stenosis 04/2017   a. s/p  AVR with 23 mm Edwards Inspiris Resilia pericardial valve in 10/2023   Stroke (HCC) 2007   No true etiology noted   UTI (urinary tract infection)     Past Surgical History:  Procedure Laterality Date   AORTIC VALVE REPLACEMENT N/A 11/02/2023   Procedure: AORTIC VALVE REPLACEMENT (AVR) USING 23 MM INSPIRIS RESILIA AORTIC VALVE;  Surgeon: Lucas Dorise POUR, MD;  Location: [facility] OR;  Service: Open Heart Surgery;  Laterality: N/A;   BENTALL PROCEDURE N/A 06/26/2024   Procedure: BENTALL PROCEDURE USING KONECT AORTIC VALVE CONDUIT SIZE ;  Surgeon: Lucas Dorise POUR, MD;  Location: [facility] OR;  Service: Open Heart Surgery;  Laterality: N/A;   CARDIOVERSION N/A 09/13/2024   Procedure: CARDIOVERSION;  Surgeon: Kate Lonni CROME, MD;  Location: [facility] INVASIVE CV LAB;  Service: Cardiovascular;  Laterality: N/A;   CORONARY ARTERY BYPASS GRAFT N/A 11/02/2023   Procedure: CORONARY ARTERY BYPASS GRAFTING (CABG) X TWO USING LEFT INTERNAL MAMMARY ARTERY AND RIGHT GREATER SAPHENEOUS VEIN HARVESTED ENDOSCOPICLY;  Surgeon: Lucas Dorise POUR, MD;  Location: [facility] OR;  Service: Open Heart Surgery;  Laterality: N/A;   CORONARY ARTERY BYPASS GRAFT N/A 06/26/2024   Procedure: CORONARY ARTERY BYPASS GRAFTING (CABG) TIMES ONE USING ENDOSCOPICALLY HARVESTED LEFT GREATER SAPHENOUS VEIN;  Surgeon: Lucas Dorise POUR, MD;  Location: [facility] OR;  Service: Open Heart Surgery;  Laterality: N/A;   CORONARY PRESSURE/FFR STUDY N/A 10/09/2023   Procedure: CORONARY PRESSURE/FFR STUDY;  Surgeon: Wendel Lurena POUR, MD;  Location: [facility] INVASIVE CV LAB;  Service: Cardiovascular;  Laterality:  N/A;   IR ANGIOGRAM PELVIS SELECTIVE OR SUPRASELECTIVE  10/27/2023   IR ANGIOGRAM PELVIS SELECTIVE OR SUPRASELECTIVE  10/27/2023   IR ANGIOGRAM SELECTIVE EACH ADDITIONAL VESSEL  10/27/2023   IR ANGIOGRAM SELECTIVE EACH ADDITIONAL VESSEL  10/27/2023   IR ANGIOGRAM SELECTIVE EACH ADDITIONAL VESSEL  10/27/2023   IR ANGIOGRAM SELECTIVE EACH ADDITIONAL VESSEL  10/27/2023   IR EMBO  ARTERIAL NOT HEMORR HEMANG INC GUIDE ROADMAPPING  10/27/2023   IR US  GUIDE VASC ACCESS RIGHT  10/27/2023   PACEMAKER IMPLANT N/A 07/01/2024   Procedure: PACEMAKER IMPLANT;  Surgeon: Inocencio Soyla Lunger, MD;  Location: [facility] INVASIVE CV LAB;  Service: Cardiovascular;  Laterality: N/A;   REDO STERNOTOMY N/A 06/26/2024   Procedure: REDO STERNOTOMY;  Surgeon: Lucas Dorise POUR, MD;  Location: [facility];  Service: Open Heart Surgery;  Laterality: N/A;   RIGHT HEART CATH AND CORONARY ANGIOGRAPHY N/A 06/20/2024   Procedure: RIGHT HEART CATH AND CORONARY ANGIOGRAPHY;  Surgeon: Ladona Heinz, MD;  [facility]: [facility] INVASIVE CV LAB;  Service: Cardiovascular;  Laterality: N/A;   RIGHT/LEFT HEART CATH AND CORONARY ANGIOGRAPHY N/A 10/09/2023   Procedure: RIGHT/LEFT HEART CATH AND CORONARY ANGIOGRAPHY;  Surgeon: Wendel Lurena POUR, MD;  Location: [facility] INVASIVE CV LAB;  Service: Cardiovascular;  Laterality: N/A;   TEE WITHOUT CARDIOVERSION N/A 11/02/2023   Procedure: TRANSESOPHAGEAL ECHOCARDIOGRAM;  Surgeon: Lucas Dorise POUR, MD;  Location: [facility] OR;  Service: Open Heart Surgery;  Laterality: N/A;   TEE WITHOUT CARDIOVERSION N/A 06/26/2024   Procedure: ECHOCARDIOGRAM, TRANSESOPHAGEAL;  Surgeon: Lucas Dorise POUR, MD;  Location: [facility];  Service: Open Heart Surgery;  Laterality: N/A;   TRANSESOPHAGEAL ECHOCARDIOGRAM (CATH LAB) N/A 06/18/2024   Procedure: TRANSESOPHAGEAL ECHOCARDIOGRAM;  Surgeon: Jeffrie Oneil BROCKS, MD;  Location: [facility] INVASIVE CV LAB;  Service: Cardiovascular;  Laterality: N/A;   TRANSTHORACIC ECHOCARDIOGRAM  04/28/2017    EF 65-70%. GR 1 DD. No regional wall motion abnormality. Moderate aortic stenosis with moderate regurgitation. Mean gradient 34 mmHg.   TRICUSPID VALVE REPLACEMENT N/A 06/26/2024   Procedure: TRICUSPID VALVE REPLACEMENT USING MOSIAC BRIOPROSTHESIS VALVE SIZE ;  Surgeon: Lucas Dorise POUR, MD;  Location: [facility] OR;  Service: Open Heart Surgery;  Laterality: N/A;   XI ROBOTIC ASSISTED SIMPLE PROSTATECTOMY N/A 05/22/2024    Procedure: PROSTATECTOMY, SIMPLE, ROBOT-ASSISTED;  Surgeon: Alvaro Ricardo KATHEE Mickey., MD;  Location: [facility] ORS;  Service: Urology;  Laterality: N/A;       Inpatient Medications: Scheduled Meds:  amiodarone   200 mg Oral BID   amLODipine   5 mg Oral Daily   finasteride   5 mg Oral QPM   pantoprazole   40 mg Oral BID   rosuvastatin   40 mg Oral Daily   sodium chloride  flush  3 mL Intravenous Q12H   Continuous Infusions:  heparin  Stopped (09/26/24 0549)   piperacillin -tazobactam (ZOSYN )  IV 3.375 g (09/26/24 0551)   PRN Meds: acetaminophen  **OR** acetaminophen , fentaNYL  (SUBLIMAZE ) injection, oxyCODONE , prochlorperazine   Allergies:    Allergies  Allergen Reactions   Tetanus Toxoid-Containing Vaccines Other (See Comments)    Childhood Allergy    Zetia  [Ezetimibe ] Diarrhea    Social History:   Social History   Socioeconomic History   Marital status: Married    Spouse name: Not on file   Number of children: 1   Years of education: Not on file   Highest education level: Not on file  Occupational History   Occupation: Retired    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  Tobacco Use   Smoking status: Every Day    Current  packs/day: 0.00    Average packs/day: 1.5 packs/day for 24.0 years (36.0 ttl pk-yrs)    Types: Cigarettes    Last attempt to quit: 08/21/2023    Years since quitting: 1.1    Passive exposure: Past   Smokeless tobacco: Former   Tobacco comments:    Former smoker 07/17/24  Vaping Use   Vaping status: Never Used  Substance and Sexual Activity   Alcohol use: Not Currently    Alcohol/week: 6.0 standard drinks of alcohol    Types: 6 Cans of beer per week   Drug use: No   Sexual activity: Not Currently  Other Topics Concern   Not on file  Social History Narrative   Married father of 1. Lives with his wife of 49 years. He is a retired Marine scientist used to work for [employer]   4 cups of coffee a day.    No structured exercise regimen   09-05-24 Pt  reports smoking 1 or 2 cigarettes on occasion   Social Drivers of Health   Financial Resource Strain: Not on file  Food Insecurity: No Food Insecurity (08/20/2024)   Hunger Vital Sign    Worried About Running Out of Food in the Last Year: Never true    Ran Out of Food in the Last Year: Never true  Recent Concern: Food Insecurity - Food Insecurity Present (05/22/2024)   Hunger Vital Sign    Worried About Running Out of Food in the Last Year: Sometimes true    Ran Out of Food in the Last Year: Sometimes true  Transportation Needs: No Transportation Needs (08/20/2024)   PRAPARE - Administrator, Civil Service (Medical): No    Lack of Transportation (Non-Medical): No  Physical Activity: Not on file  Stress: Not on file  Social Connections: Moderately Integrated (08/20/2024)   Social Connection and Isolation Panel    Frequency of Communication with Friends and Family: More than three times a week    Frequency of Social Gatherings with Friends and Family: Twice a week    Attends Religious Services: More than 4 times per year    Active Member of [employer] or Organizations: No    Attends Banker Meetings: Never    Marital Status: Married  Catering manager Violence: Not At Risk (08/20/2024)   Humiliation, Afraid, Rape, and Kick questionnaire    Fear of Current or Ex-Partner: No    Emotionally Abused: No    Physically Abused: No    Sexually Abused: No    Family History:    Family History  Problem Relation Age of Onset   Coronary artery disease Mother 79       45. Was diagnosed with a blood clot - suspect that this was a STEMI   Lung cancer Father    Thyroid  disease Sister    Healthy Brother 53   Pancreatic cancer Sister 29   Cancer Maternal Grandmother    Heart disease Maternal Grandfather 62   Cancer Paternal Grandmother    Stroke Paternal Grandfather      ROS:  Please see the history of present illness.  ROS  All other ROS reviewed and negative.     Physical  Exam/Data:   Vitals:   09/26/24 0308 09/26/24 0308 09/26/24 0310 09/26/24 0400  BP: 117/76   124/76  Pulse:  94 92 93  Resp: 16   18  Temp:  98.8 F (37.1 C)    TempSrc:  Oral    SpO2:  96% 94%  Weight:      Height:        Intake/Output Summary (Last 24 hours) at 09/26/2024 1006 Last data filed at 09/26/2024 0933 Gross per 24 hour  Intake --  Output 600 ml  Net -600 ml   Filed Weights   09/25/24 2034  Weight: 70.3 kg   Body mass index is 27.46 kg/m.  General:  Well nourished, well developed, in no acute distress HEENT: normal Lymph: no adenopathy Neck: no JVD Endocrine:  No thryomegaly Vascular: No carotid bruits; FA pulses 2+ bilaterally without bruits  Cardiac:  normal S1, S2; RRR; no murmur  Lungs:  clear to auscultation bilaterally, no wheezing, rhonchi or rales  Abd: soft, nontender, no hepatomegaly  Ext: no edema Musculoskeletal:  No deformities, BUE and BLE strength normal and equal Skin: warm and dry  Neuro:  CNs 2-12 intact, no focal abnormalities noted Psych:  Normal affect   Laboratory Data:  Chemistry Recent Labs  Lab 09/25/24 2056 09/26/24 0430  NA 137 137  K 4.0 3.8  CL 103 104  CO2 24 24  GLUCOSE 100* 103*  BUN 24* 15  CREATININE 1.19 0.82  CALCIUM  9.1 9.0  GFRNONAA >60 >60  ANIONGAP 10 9    Recent Labs  Lab 09/25/24 2056 09/26/24 0430  PROT 7.1 6.7  ALBUMIN  3.9 3.6  AST 22 20  ALT 12 12  ALKPHOS 95 92  BILITOT 0.3 0.3   Hematology Recent Labs  Lab 09/25/24 2056 09/26/24 0430  WBC 9.2 9.2  RBC 4.37 4.16*  HGB 11.5* 10.9*  HCT 37.0* 35.1*  MCV 84.7 84.4  MCH 26.3 26.2  MCHC 31.1 31.1  RDW 15.9* 15.8*  PLT 184 161   Cardiac EnzymesNo results for input(s): TROPONINI in the last 168 hours. No results for input(s): TROPIPOC in the last 168 hours.  BNPNo results for input(s): BNP, PROBNP in the last 168 hours.  DDimer No results for input(s): DDIMER in the last 168 hours.  Radiology/Studies:  CT ABDOMEN  PELVIS W CONTRAST Result Date: 09/25/2024 CLINICAL DATA:  Acute onset upper abdominal pain, initial encounter EXAM: CT ABDOMEN AND PELVIS WITH CONTRAST TECHNIQUE: Multidetector CT imaging of the abdomen and pelvis was performed using the standard protocol following bolus administration of intravenous contrast. RADIATION DOSE REDUCTION: This exam was performed according to the departmental dose-optimization program which includes automated exposure control, adjustment of the mA and/or kV according to patient size and/or use of iterative reconstruction technique. CONTRAST:  OMNIPAQUE  IOHEXOL  300 MG/ML  SOLN COMPARISON:  08/21/2024 ultrasound, CT from 08/20/2024 FINDINGS: Lower chest: No acute abnormality. Hepatobiliary: Scattered cysts are noted within the liver. The gallbladder is well distended with multiple small gallstones. Persistent inflammatory changes noted surrounding the common bile duct and central gallbladder as well as the second portion of the duodenum. This is increased when compared with the prior exam. Pancreas: Pancreas is within normal limits. Spleen: Normal in size without focal abnormality. Calcified granulomas are again seen. Adrenals/Urinary Tract: Adrenal glands are within normal limits. Partially calcified cysts are noted within the kidneys bilaterally stable from the prior exam. No follow-up is recommended. No renal calculi are seen. No obstructive changes are noted. The bladder is partially distended. Some wall thickening of the bladder is noted stable from the prior exam. Stomach/Bowel: Scattered diverticular change of the colon is noted without evidence of diverticulitis. The appendix is within normal limits. Stomach is unremarkable. Inflammatory changes surrounding the second portion of the duodenum are seen.  Vascular/Lymphatic: Aortic atherosclerosis. No enlarged abdominal or pelvic lymph nodes. Reproductive: Prostate is unremarkable. Other: No abdominal wall hernia or  abnormality. No abdominopelvic ascites. Musculoskeletal: No acute or significant osseous findings. IMPRESSION: Cholelithiasis. Prominence of the common bile duct is again identified and stable. Inflammatory changes in the right upper quadrant near the common bile duct and second portion of the duodenum. These have increased in the interval from the prior exam and again may represent acute cholecystitis although the possibility of duodenitis deserves consideration as well. Diverticulosis without diverticulitis. Bladder wall thickening stable from the prior exam which may represent cystitis. Electronically Signed   By: Oneil Devonshire M.D.   On: 09/25/2024 23:30    Assessment and Plan:   Preop cardiac risk stratification for cholecystectomy - Patient recently underwent cardioversion for atrial flutter on 09/13/2024.  Ideally, he will need to be on uninterrupted systemic anticoagulation for 4 weeks after DCCV.  However, if the procedure is deemed to be urgent, can switch Eliquis  to heparin  drip and proceed with surgery (heparin  will be interrupted for the surgery).  If the procedure is not urgent, then can complete 4 weeks of uninterrupted systemic AC and then schedule surgery.  Low risk.  No further cardiac workup is indicated prior to proceeding with the planned surgery.  A-fib/atrial flutter s/p DCCV on 09/13/2024 - Continue amiodarone  200 mg twice daily. - On Eliquis  5 mg twice daily at home, switched to heparin  drip for possible cholecystectomy.  Please see plan above.  AS s/p SAVR and CABG (LIMA to LAD, SVG to ramus) in 10/2023 Prosthetic aortic valve endocarditis and native tricuspid valve endocarditis s/p redo sternotomy with Bentall procedure, CABG (SVG to RCA), TVR in 06/2024 c/w postop CHB s/p PPM - Prior echo from August 2025 showed normal LVEF, stable gradients across the aortic and tricuspid prosthesis.  Moderate MR and moderate left pleural effusion was noted.  Acute cholecystitis - Currently  on antibiotics, cholecystectomy per general surgery.  [facility] will sign off.   Medication Recommendations: Please see recommendations above Other recommendations (labs, testing, etc): None Follow up as an outpatient: None  Patient has complicated cardiac history.  50 minutes spent in remainder prior medical records, more than 3 labs, discussion and documentation.  Recommendations relayed to the primary team and general surgery team.  For questions or updates, please contact [facility] Please consult [email or web address] for contact info under Cardiology/STEMI.   Signed, Katlynne Mckercher Priya Shabana Armentrout, MD 09/26/2024 10:06 AM

## 2024-09-26 NOTE — Assessment & Plan Note (Addendum)
 09/26/24 Not currently exacerbated.  Appears euvolemic.  09/27/24 stable.

## 2024-09-26 NOTE — Assessment & Plan Note (Addendum)
 09/26/24 BP stable for now.  Continue Norvasc .  09/27/24 stable.

## 2024-09-26 NOTE — Assessment & Plan Note (Addendum)
 09/26/24 General Surgery consulted.  Continue with IV antibiotics.  Follow-up on right upper quadrant ultrasound.  **update. RUQ U/S shows Cholelithiasis with mild gallbladder wall thickening. 2. Possible distal common bile duct dilatation; consider MRCP for further evaluation if clinically warranted. 3. 3.1 x 2.6 x 2.9 cm right hepatic lobe cystic lesion, which may represent adjacent simple cysts versus a septated cystic lesion such as cystadenoma  Will defer to general surgery to manage his cholelithiasis.  09/27/24 for MRCP today. Surgery to decide on next steps in management. Remains on IV ABX for now.

## 2024-09-26 NOTE — H&P (Signed)
 History and Physical    Kyle Matthews FMW:[id number] DOB: 01-26-1946 DOA: 09/25/2024  PCP: Espinoza, Alejandra, DO   Patient coming from: Home   Chief Complaint: Upper abdominal pain, N/V   HPI: Kyle Matthews is a 78 y.o. male with medical history significant for hypertension, hyperlipidemia, CAD status post CABG, bioprosthetic AVR, TVR, atrial fibrillation and flutter on Eliquis , history of CVA, COPD, and BPH status post prostatectomy who presents with acute onset upper abdominal pain, nausea, and vomiting.  Patient was in his usual state until this evening when he developed acute onset of back pain and initially which was followed closely by nausea with nonbloody vomiting and then pain in the upper abdomen.  He had not eaten anything for several hours prior to this and is unable to identify any alleviating or exacerbating factors.  Symptoms are similar to what he presented with in September 2025.  He is followed by surgery for symptomatic cholelithiasis but will require cardiac clearance prior to surgery.  Additionally, cardiology has advised that he does not come off of anticoagulation for at least a month following his recent cardioversion.  ED Course: Upon arrival to the ED, patient is found to be afebrile and saturating well on room air with normal RR, normal HR, and stable BP.  Labs are most notable for normal LFTs, normal lipase, and normal WBC.  CT demonstrates cholelithiasis with stable prominence of the CBD and increased inflammatory changes in the right upper quadrant.  Patient was started on Rocephin  and Flagyl in the ED.  Review of Systems:  All other systems reviewed and apart from HPI, are negative.  Past Medical History:  Diagnosis Date   Anemia    Anxiety    Arthritis    BPH (benign prostatic hyperplasia)    Status post biopsy in 2009. - w/ LUTS   Coronary artery disease    a. s/p CABG in 10/2023 with LIMA-LAD and SVG-Ramus at the time of AVR   Depression    Essential  hypertension    Hyperlipidemia    On Crestor  - followed by PCP   Moderate calcific aortic stenosis 04/2017   a. s/p AVR with 23 mm Edwards Inspiris Resilia pericardial valve in 10/2023   Stroke (HCC) 2007   No true etiology noted   UTI (urinary tract infection)     Past Surgical History:  Procedure Laterality Date   AORTIC VALVE REPLACEMENT N/A 11/02/2023   Procedure: AORTIC VALVE REPLACEMENT (AVR) USING 23 MM INSPIRIS RESILIA AORTIC VALVE;  Surgeon: Lucas Dorise POUR, MD;  Location: [facility] OR;  Service: Open Heart Surgery;  Laterality: N/A;   BENTALL PROCEDURE N/A 06/26/2024   Procedure: BENTALL PROCEDURE USING KONECT AORTIC VALVE CONDUIT SIZE ;  Surgeon: Lucas Dorise POUR, MD;  Location: [facility] OR;  Service: Open Heart Surgery;  Laterality: N/A;   CARDIOVERSION N/A 09/13/2024   Procedure: CARDIOVERSION;  Surgeon: Kate Lonni CROME, MD;  Location: [facility] INVASIVE CV LAB;  Service: Cardiovascular;  Laterality: N/A;   CORONARY ARTERY BYPASS GRAFT N/A 11/02/2023   Procedure: CORONARY ARTERY BYPASS GRAFTING (CABG) X TWO USING LEFT INTERNAL MAMMARY ARTERY AND RIGHT GREATER SAPHENEOUS VEIN HARVESTED ENDOSCOPICLY;  Surgeon: Lucas Dorise POUR, MD;  Location: [facility] OR;  Service: Open Heart Surgery;  Laterality: N/A;   CORONARY ARTERY BYPASS GRAFT N/A 06/26/2024   Procedure: CORONARY ARTERY BYPASS GRAFTING (CABG) TIMES ONE USING ENDOSCOPICALLY HARVESTED LEFT GREATER SAPHENOUS VEIN;  Surgeon: Lucas Dorise POUR, MD;  Location: [facility] OR;  Service: Open Heart Surgery;  Laterality: N/A;  CORONARY PRESSURE/FFR STUDY N/A 10/09/2023   Procedure: CORONARY PRESSURE/FFR STUDY;  Surgeon: Wendel Lurena POUR, MD;  Location: [facility] INVASIVE CV LAB;  Service: Cardiovascular;  Laterality: N/A;   IR ANGIOGRAM PELVIS SELECTIVE OR SUPRASELECTIVE  10/27/2023   IR ANGIOGRAM PELVIS SELECTIVE OR SUPRASELECTIVE  10/27/2023   IR ANGIOGRAM SELECTIVE EACH ADDITIONAL VESSEL  10/27/2023   IR ANGIOGRAM SELECTIVE EACH ADDITIONAL VESSEL  10/27/2023   IR  ANGIOGRAM SELECTIVE EACH ADDITIONAL VESSEL  10/27/2023   IR ANGIOGRAM SELECTIVE EACH ADDITIONAL VESSEL  10/27/2023   IR EMBO ARTERIAL NOT HEMORR HEMANG INC GUIDE ROADMAPPING  10/27/2023   IR US  GUIDE VASC ACCESS RIGHT  10/27/2023   PACEMAKER IMPLANT N/A 07/01/2024   Procedure: PACEMAKER IMPLANT;  Surgeon: Inocencio Soyla Lunger, MD;  Location: [facility] INVASIVE CV LAB;  Service: Cardiovascular;  Laterality: N/A;   REDO STERNOTOMY N/A 06/26/2024   Procedure: REDO STERNOTOMY;  Surgeon: Lucas Dorise POUR, MD;  Location: [facility] OR;  Service: Open Heart Surgery;  Laterality: N/A;   RIGHT HEART CATH AND CORONARY ANGIOGRAPHY N/A 06/20/2024   Procedure: RIGHT HEART CATH AND CORONARY ANGIOGRAPHY;  Surgeon: Ladona Heinz, MD;  Location: [facility] INVASIVE CV LAB;  Service: Cardiovascular;  Laterality: N/A;   RIGHT/LEFT HEART CATH AND CORONARY ANGIOGRAPHY N/A 10/09/2023   Procedure: RIGHT/LEFT HEART CATH AND CORONARY ANGIOGRAPHY;  Surgeon: Wendel Lurena POUR, MD;  Location: [facility] INVASIVE CV LAB;  Service: Cardiovascular;  Laterality: N/A;   TEE WITHOUT CARDIOVERSION N/A 11/02/2023   Procedure: TRANSESOPHAGEAL ECHOCARDIOGRAM;  Surgeon: Lucas Dorise POUR, MD;  Location[facility];  Service: Open Heart Surgery;  Laterality: N/A;   TEE WITHOUT CARDIOVERSION N/A 06/26/2024   Procedure: ECHOCARDIOGRAM, TRANSESOPHAGEAL;  Surgeon: Lucas Dorise POUR, MD;  Location: [facility];  Service: Open Heart Surgery;  Laterality: N/A;   TRANSESOPHAGEAL ECHOCARDIOGRAM (CATH LAB) N/A 06/18/2024   Procedure: TRANSESOPHAGEAL ECHOCARDIOGRAM;  Surgeon: Jeffrie Oneil BROCKS, MD;  Location: [facility] INVASIVE CV LAB;  Service: Cardiovascular;  Laterality: N/A;   TRANSTHORACIC ECHOCARDIOGRAM  04/28/2017    EF 65-70%. GR 1 DD. No regional wall motion abnormality. Moderate aortic stenosis with moderate regurgitation. Mean gradient 34 mmHg.   TRICUSPID VALVE REPLACEMENT N/A 06/26/2024   Procedure: TRICUSPID VALVE REPLACEMENT USING MOSIAC BRIOPROSTHESIS VALVE SIZE ;  Surgeon: Lucas Dorise POUR, MD;   Location: [facility] OR;  Service: Open Heart Surgery;  Laterality: N/A;   XI ROBOTIC ASSISTED SIMPLE PROSTATECTOMY N/A 05/22/2024   Procedure: PROSTATECTOMY, SIMPLE, ROBOT-ASSISTED;  Surgeon: Alvaro Ricardo KATHEE Mickey., MD;  Location: [facility] ORS;  Service: Urology;  Laterality: N/A;    Social History:   reports that he has been smoking cigarettes. He has a 36 pack-year smoking history. He has been exposed to tobacco smoke. He has quit using smokeless tobacco. He reports that he does not currently use alcohol after a past usage of about 6.0 standard drinks of alcohol per week. He reports that he does not use drugs.  Allergies  Allergen Reactions   Tetanus Toxoid-Containing Vaccines Other (See Comments)    Childhood Allergy    Zetia  [Ezetimibe ] Diarrhea    Family History  Problem Relation Age of Onset   Coronary artery disease Mother 28       2. Was diagnosed with a blood clot - suspect that this was a STEMI   Lung cancer Father    Thyroid  disease Sister    Healthy Brother 35   Pancreatic cancer Sister 64   Cancer Maternal Grandmother    Heart disease Maternal Grandfather 20   Cancer Paternal  Grandmother    Stroke Paternal Grandfather      Prior to Admission medications   Medication Sig Start Date End Date Taking? Authorizing Provider  amiodarone  (PACERONE ) 200 MG tablet Take 1 tablet (200 mg total) by mouth daily. 08/13/24   Fenton, Clint R, PA  amLODipine  (NORVASC ) 5 MG tablet Take 0.5 tablets (2.5 mg total) by mouth daily. 08/21/24   Pearlean Manus, MD  apixaban  (ELIQUIS ) 5 MG TABS tablet Take 1 tablet (5 mg total) by mouth 2 (two) times daily. 09/05/24   Fenton, Clint R, PA  aspirin  81 MG chewable tablet Chew 81 mg by mouth daily. Patient not taking: Reported on 09/11/2024 09/07/24   [provider]  finasteride  (PROSCAR ) 5 MG tablet Take 5 mg by mouth every evening.    [provider]  furosemide  (LASIX ) 40 MG tablet Take 1 tablet (40 mg total) by mouth daily. 08/21/24   Pearlean Manus, MD  nitroGLYCERIN  (NITROSTAT ) 0.4 MG SL tablet Place 1 tablet (0.4 mg total) under the tongue every 5 (five) minutes as needed for chest pain. 10/19/23   Thukkani, Arun K, MD  oxyCODONE  (OXYCONTIN ) 10 mg 12 hr tablet 1 tablet Orally every 12 hrs As needed    [provider]  pantoprazole  (PROTONIX ) 40 MG tablet Take 1 tablet (40 mg total) by mouth daily. 08/21/24 08/21/25  Pearlean Manus, MD  potassium chloride  SA (KLOR-CON  M) 20 MEQ tablet Take 1 tablet (20 mEq total) by mouth daily. X 7 days, then change to only days you take a Lasix  07/09/24   Barrett, Erin R, PA-C  rosuvastatin  (CRESTOR ) 10 MG tablet Take 1 tablet (10 mg total) by mouth daily. Patient taking differently: Take 40 mg by mouth daily. 07/04/24   Barrett, Erin R, PA-C  senna-docusate (SENOKOT-S) 8.6-50 MG tablet Take 2 tablets by mouth at bedtime. 08/21/24 08/21/25  Pearlean Manus, MD    Physical Exam: Vitals:   09/25/24 2032 09/25/24 2034  BP: (!) 140/83   Pulse: 86   Resp: 16   Temp: 98.3 F (36.8 C)   TempSrc: Oral   SpO2: 97%   Weight:  70.3 kg    Constitutional: NAD, calm  Eyes: PERTLA, lids and conjunctivae normal ENMT: Mucous membranes are moist. Posterior pharynx clear of any exudate or lesions.   Neck: supple, no masses  Respiratory: no wheezing, no crackles. No accessory muscle use.  Cardiovascular: S1 & S2 heard, regular rate and rhythm. No JVD. Abdomen: Tender in epigastrium and RUQ. Bowel sounds active.  Musculoskeletal: no clubbing / cyanosis. No joint deformity upper and lower extremities.   Skin: no significant rashes, lesions, ulcers. Warm, dry, well-perfused. Neurologic: CN 2-12 grossly intact. Moving all extremities. Alert and oriented.  Psychiatric: Pleasant. Cooperative.    Labs and Imaging on Admission: I have personally reviewed following labs and imaging studies  CBC: Recent Labs  Lab 09/25/24 2056  WBC 9.2  HGB 11.5*  HCT 37.0*  MCV 84.7  PLT 184   Basic Metabolic  Panel: Recent Labs  Lab 09/25/24 2056  NA 137  K 4.0  CL 103  CO2 24  GLUCOSE 100*  BUN 24*  CREATININE 1.19  CALCIUM  9.1   GFR: Estimated Creatinine Clearance: 46.5 mL/min (by C-G formula based on SCr of 1.19 mg/dL). Liver Function Tests: Recent Labs  Lab 09/25/24 2056  AST 22  ALT 12  ALKPHOS 95  BILITOT 0.3  PROT 7.1  ALBUMIN  3.9   Recent Labs  Lab 09/25/24 2056  LIPASE  24   No results for input(s): AMMONIA in the last 168 hours. Coagulation Profile: No results for input(s): INR, PROTIME in the last 168 hours. Cardiac Enzymes: No results for input(s): CKTOTAL, CKMB, CKMBINDEX, TROPONINI in the last 168 hours. BNP (last 3 results) No results for input(s): PROBNP in the last 8760 hours. HbA1C: No results for input(s): HGBA1C in the last 72 hours. CBG: Recent Labs  Lab 09/25/24 2100  GLUCAP 179*   Lipid Profile: No results for input(s): CHOL, HDL, LDLCALC, TRIG, CHOLHDL, LDLDIRECT in the last 72 hours. Thyroid  Function Tests: No results for input(s): TSH, T4TOTAL, FREET4, T3FREE, THYROIDAB in the last 72 hours. Anemia Panel: No results for input(s): VITAMINB12, FOLATE, FERRITIN, TIBC, IRON , RETICCTPCT in the last 72 hours. Urine analysis:    Component Value Date/Time   COLORURINE YELLOW 09/25/2024 2037   APPEARANCEUR CLEAR 09/25/2024 2037   LABSPEC 1.017 09/25/2024 2037   PHURINE 6.0 09/25/2024 2037   GLUCOSEU NEGATIVE 09/25/2024 2037   HGBUR NEGATIVE 09/25/2024 2037   BILIRUBINUR NEGATIVE 09/25/2024 2037   KETONESUR NEGATIVE 09/25/2024 2037   PROTEINUR 30 (A) 09/25/2024 2037   NITRITE NEGATIVE 09/25/2024 2037   LEUKOCYTESUR NEGATIVE 09/25/2024 2037   Sepsis Labs: @LABRCNTIP (procalcitonin:4,lacticidven:4) )No results found for this or any previous visit (from the past 240 hours).   Radiological Exams on Admission: CT ABDOMEN PELVIS W CONTRAST Result Date: 09/25/2024 CLINICAL DATA:  Acute onset  upper abdominal pain, initial encounter EXAM: CT ABDOMEN AND PELVIS WITH CONTRAST TECHNIQUE: Multidetector CT imaging of the abdomen and pelvis was performed using the standard protocol following bolus administration of intravenous contrast. RADIATION DOSE REDUCTION: This exam was performed according to the departmental dose-optimization program which includes automated exposure control, adjustment of the mA and/or kV according to patient size and/or use of iterative reconstruction technique. CONTRAST:  OMNIPAQUE  IOHEXOL  300 MG/ML  SOLN COMPARISON:  08/21/2024 ultrasound, CT from 08/20/2024 FINDINGS: Lower chest: No acute abnormality. Hepatobiliary: Scattered cysts are noted within the liver. The gallbladder is well distended with multiple small gallstones. Persistent inflammatory changes noted surrounding the common bile duct and central gallbladder as well as the second portion of the duodenum. This is increased when compared with the prior exam. Pancreas: Pancreas is within normal limits. Spleen: Normal in size without focal abnormality. Calcified granulomas are again seen. Adrenals/Urinary Tract: Adrenal glands are within normal limits. Partially calcified cysts are noted within the kidneys bilaterally stable from the prior exam. No follow-up is recommended. No renal calculi are seen. No obstructive changes are noted. The bladder is partially distended. Some wall thickening of the bladder is noted stable from the prior exam. Stomach/Bowel: Scattered diverticular change of the colon is noted without evidence of diverticulitis. The appendix is within normal limits. Stomach is unremarkable. Inflammatory changes surrounding the second portion of the duodenum are seen. Vascular/Lymphatic: Aortic atherosclerosis. No enlarged abdominal or pelvic lymph nodes. Reproductive: Prostate is unremarkable. Other: No abdominal wall hernia or abnormality. No abdominopelvic ascites. Musculoskeletal: No acute or significant  osseous findings. IMPRESSION: Cholelithiasis. Prominence of the common bile duct is again identified and stable. Inflammatory changes in the right upper quadrant near the common bile duct and second portion of the duodenum. These have increased in the interval from the prior exam and again may represent acute cholecystitis although the possibility of duodenitis deserves consideration as well. Diverticulosis without diverticulitis. Bladder wall thickening stable from the prior exam which may represent cystitis. Electronically Signed   By: Oneil Devonshire M.D.   On: 09/25/2024  23:30    EKG: Independently reviewed. Paced.   Assessment/Plan   1. Symptomatic cholelithiasis  - Presents with recurrent abdominal pain and N/V; increased RUQ inflammatory changes noted on CT  - He is afebrile with normal WBC, normal LFTs, and normal lipase  - Check RUQ US , hold Eliquis  and use IV heparin  for now, keep on bowel rest, continue antibiotics for now, repeat labs in am, consult surgery    2. CAD  - No anginal symptoms   - Continue Crestor     3. Chronic HFpEF  - Chronic edema has decreased recently, he can now fit in his shoes again  - Hold diuretic while on bowel rest, monitor volume status   4. Atrial fibrillation/flutter  - S/p cardioversion on 09/13/24  - Use IV heparin  for now pending further workup, continue amiodarone    5. Hypertension  - Norvasc      DVT prophylaxis: IV heparin   Code Status: Full  Level of Care: Level of care: Med-Surg Family Communication: Wife at bedside  Disposition Plan:  Patient is from: Home  Anticipated d/c is to: Home Anticipated d/c date is: 09/28/24  Patient currently: Pending RUQ US , clinical course, surgery consultation  Consults called: Surgery  Admission status: Inpatient     Evalene GORMAN Sprinkles, MD [facility]  09/26/2024, 2:03 AM

## 2024-09-26 NOTE — Consult Note (Signed)
 [facility] Consult  Reason for Consult: Gallstones, concern for acute cholecystitis  Referring Physician:  Dr. Laurence   Chief Complaint   Abdominal Pain     HPI: Kyle Matthews is a 78 y.o. male with significant cardiac history s/p CABG and aortic valve surgery 10/2023 and 1 vessel CABG, Bentall procedure, pacemaker 06/2024. He had a cardioversion 09/13/24 and is suppose to remain on his anticoagulation for at least 1 month.  He has come into the hospital again with RUQ pain and nausea/vomiting. He was seen at [facility] 1 month ago with similar symptoms. His imaging was consistent with cholelithiasis but no signs of cholecystitis at that time. I saw him and recommended that he get cardiology to evaluate him given his cardiac history and risk. He was able to actually eat and wanted to go home. The hospitalist sent him out that day.  He had follow up with his cardiologist already scheduled. Per the cardiology teams notes after his 09/13/24 cardioversion Per Dr.Thukkani 09/20/2024 - Kyle Matthews had a cardioversion recently so he cannot come off Eliquis  for at least a month after that. If this procedure is elective then I would hold off for a few months.    In the time between his last admission he also has seen Dr. Vernetta, CCS and has plans for lap cholecystectomy and possible cholangiogram (chronically dilated CBD 9mm)  The patient came in overnight with worsening RUQ pain and nausea/vomiting. He was admitted to the hospitalist service and started on antibiotics, and I was informed this AM.   He says that he is still having pain but he has some improvement. He has had no further nausea/ vomiting.   Past Medical History:  Diagnosis Date   Anemia    Anxiety    Arthritis    BPH (benign prostatic hyperplasia)    Status post biopsy in 2009. - w/ LUTS   Coronary artery disease    a. s/p CABG in 10/2023 with LIMA-LAD and SVG-Ramus at the time of AVR   Depression    Essential  hypertension    Hyperlipidemia    On Crestor  - followed by PCP   Moderate calcific aortic stenosis 04/2017   a. s/p AVR with 23 mm Edwards Inspiris Resilia pericardial valve in 10/2023   Stroke (HCC) 2007   No true etiology noted   UTI (urinary tract infection)     Past Surgical History:  Procedure Laterality Date   AORTIC VALVE REPLACEMENT N/A 11/02/2023   Procedure: AORTIC VALVE REPLACEMENT (AVR) USING 23 MM INSPIRIS RESILIA AORTIC VALVE;  Surgeon: Lucas Dorise POUR, MD;  Location: [facility] OR;  Service: Open Heart Surgery;  Laterality: N/A;   BENTALL PROCEDURE N/A 06/26/2024   Procedure: BENTALL PROCEDURE USING KONECT AORTIC VALVE CONDUIT SIZE ;  Surgeon: Lucas Dorise POUR, MD;  Location: [facility] OR;  Service: Open Heart Surgery;  Laterality: N/A;   CARDIOVERSION N/A 09/13/2024   Procedure: CARDIOVERSION;  Surgeon: Kate Lonni CROME, MD;  Location: [facility] INVASIVE CV LAB;  Service: Cardiovascular;  Laterality: N/A;   CORONARY ARTERY BYPASS GRAFT N/A 11/02/2023   Procedure: CORONARY ARTERY BYPASS GRAFTING (CABG) X TWO USING LEFT INTERNAL MAMMARY ARTERY AND RIGHT GREATER SAPHENEOUS VEIN HARVESTED ENDOSCOPICLY;  Surgeon: Lucas Dorise POUR, MD;  Location: [facility] OR;  Service: Open Heart Surgery;  Laterality: N/A;   CORONARY ARTERY BYPASS GRAFT N/A 06/26/2024   Procedure: CORONARY ARTERY BYPASS GRAFTING (CABG) TIMES ONE USING ENDOSCOPICALLY HARVESTED LEFT GREATER SAPHENOUS VEIN;  Surgeon: Lucas Dorise POUR, MD;  Location: [facility] OR;  Service: Open Heart Surgery;  Laterality: N/A;   CORONARY PRESSURE/FFR STUDY N/A 10/09/2023   Procedure: CORONARY PRESSURE/FFR STUDY;  Surgeon: Wendel Lurena POUR, MD;  Location: [facility] INVASIVE CV LAB;  Service: Cardiovascular;  Laterality: N/A;   IR ANGIOGRAM PELVIS SELECTIVE OR SUPRASELECTIVE  10/27/2023   IR ANGIOGRAM PELVIS SELECTIVE OR SUPRASELECTIVE  10/27/2023   IR ANGIOGRAM SELECTIVE EACH ADDITIONAL VESSEL  10/27/2023   IR ANGIOGRAM SELECTIVE EACH ADDITIONAL VESSEL  10/27/2023   IR  ANGIOGRAM SELECTIVE EACH ADDITIONAL VESSEL  10/27/2023   IR ANGIOGRAM SELECTIVE EACH ADDITIONAL VESSEL  10/27/2023   IR EMBO ARTERIAL NOT HEMORR HEMANG INC GUIDE ROADMAPPING  10/27/2023   IR US  GUIDE VASC ACCESS RIGHT  10/27/2023   PACEMAKER IMPLANT N/A 07/01/2024   Procedure: PACEMAKER IMPLANT;  Surgeon: Inocencio Soyla Lunger, MD;  [facility]: [facility] INVASIVE CV LAB;  Service: Cardiovascular;  Laterality: N/A;   REDO STERNOTOMY N/A 06/26/2024   Procedure: REDO STERNOTOMY;  Surgeon: Lucas Dorise POUR, MD;  [facility];  Service: Open Heart Surgery;  Laterality: N/A;   RIGHT HEART CATH AND CORONARY ANGIOGRAPHY N/A 06/20/2024   Procedure: RIGHT HEART CATH AND CORONARY ANGIOGRAPHY;  Surgeon: Ladona Heinz, MD;  [facility] INVAS[facility] CV LAB;  Service: Cardiovascular;  Laterality: N/A;   RIGHT/LEFT HEART CATH AND CORONARY ANGIOGRAPHY N/A 10/09/2023   Procedure: RIGHT/LEFT HEART CATH AND CORONARY ANGIOGRAPHY;  Surgeon: Wendel Lurena POUR, MD;  [facility] INVASIVE CV LAB;  Service: Cardiovascular;  Laterality: N/A;   TEE WITHOUT CARDIOVERSION N/A 11/02/2023   Procedure: TRANSESOPHAGEAL ECHOCARDIOGRAM;  Surgeon: Lucas Dorise POUR, MD;  Location: [facility] OR;  Service: Open Heart Surgery;  Laterality: N/A;   TEE WITHOUT CARDIOVERSION N/A 06/26/2024   Procedure: ECHOCARDIOGRAM, TRANSESOPHAGEAL;  Surgeon: Lucas Dorise POUR, MD;  [facility];  Service: Open Heart Surgery;  Laterality: N/A;   TRANSESOPHAGEAL ECHOCARDIOGRAM (CATH LAB) N/A 06/18/2024   Procedure: TRANSESOPHAGEAL ECHOCARDIOGRAM;  Surgeon: Jeffrie Oneil BROCKS, MD;  Location: [facility] INVASIVE CV LAB;  Service: Cardiovascular;  Laterality: N/A;   TRANSTHORACIC ECHOCARDIOGRAM  04/28/2017    EF 65-70%. GR 1 DD. No regional wall motion abnormality. Moderate aortic stenosis with moderate regurgitation. Mean gradient 34 mmHg.   TRICUSPID VALVE REPLACEMENT N/A 06/26/2024   Procedure: TRICUSPID VALVE REPLACEMENT USING MOSIAC BRIOPROSTHESIS VALVE SIZE ;  Surgeon: Lucas Dorise POUR, MD;   Location: [facility] OR;  Service: Open Heart Surgery;  Laterality: N/A;   XI ROBOTIC ASSISTED SIMPLE PROSTATECTOMY N/A 05/22/2024   Procedure: PROSTATECTOMY, SIMPLE, ROBOT-ASSISTED;  Surgeon: Alvaro Ricardo KATHEE Mickey., MD;  Location: [facility] ORS;  Service: Urology;  Laterality: N/A;    Family History  Problem Relation Age of Onset   Coronary artery disease Mother 35       75. Was diagnosed with a blood clot - suspect that this was a STEMI   Lung cancer Father    Thyroid  disease Sister    Healthy Brother 30   Pancreatic cancer Sister 60   Cancer Maternal Grandmother    Heart disease Maternal Grandfather 51   Cancer Paternal Grandmother    Stroke Paternal Grandfather     Social History   Tobacco Use   Smoking status: Every Day    Current packs/day: 0.00    Average packs/day: 1.5 packs/day for 24.0 years (36.0 ttl pk-yrs)    Types: Cigarettes    Last attempt to quit: 08/21/2023    Years since quitting: 1.1    Passive exposure: Past   Smokeless tobacco: Former  Tobacco comments:    Former smoker 07/17/24  Vaping Use   Vaping status: Never Used  Substance Use Topics   Alcohol use: Not Currently    Alcohol/week: 6.0 standard drinks of alcohol    Types: 6 Cans of beer per week   Drug use: No    Medications: I have reviewed the patient's current medications. Prior to Admission: (Not in a hospital admission)  Scheduled:  amiodarone   200 mg Oral BID   amLODipine   5 mg Oral Daily   finasteride   5 mg Oral QPM   pantoprazole   40 mg Oral BID   rosuvastatin   40 mg Oral Daily   sodium chloride  flush  3 mL Intravenous Q12H   Continuous:  heparin  Stopped (09/26/24 0549)   piperacillin -tazobactam (ZOSYN )  IV 3.375 g (09/26/24 0551)   PRN:acetaminophen  **OR** acetaminophen , fentaNYL  (SUBLIMAZE ) injection, oxyCODONE , prochlorperazine   Allergies  Allergen Reactions   Tetanus Toxoid-Containing Vaccines Other (See Comments)    Childhood Allergy    Zetia  [Ezetimibe ] Diarrhea     ROS:  A  comprehensive review of systems was negative except for: Gastrointestinal: positive for abdominal pain, nausea, and vomiting  Blood pressure 124/76, pulse 93, temperature 98.8 F (37.1 C), temperature source Oral, resp. rate 18, height 5' 3 (1.6 m), weight 70.3 kg, SpO2 94%. Physical Exam Vitals reviewed.  HENT:     Head: Normocephalic.  Cardiovascular:     Rate and Rhythm: Normal rate and regular rhythm.  Pulmonary:     Effort: Pulmonary effort is normal.  Abdominal:     General: There is distension.     Palpations: Abdomen is soft.     Tenderness: There is abdominal tenderness in the right upper quadrant and epigastric area.  Skin:    General: Skin is warm.  Neurological:     General: No focal deficit present.     Mental Status: He is alert and oriented to person, place, and time.  Psychiatric:        Mood and Affect: Mood normal.        Behavior: Behavior normal.     Results: Results for orders placed or performed during the hospital encounter of 09/25/24 (from the past 48 hours)  Urinalysis, Routine w reflex microscopic -Urine, Clean Catch     Status: Abnormal   Collection Time: 09/25/24  8:37 PM  Result Value Ref Range   Color, Urine YELLOW YELLOW   APPearance CLEAR CLEAR   Specific Gravity, Urine 1.017 1.005 - 1.030   pH 6.0 5.0 - 8.0   Glucose, UA NEGATIVE NEGATIVE mg/dL   Hgb urine dipstick NEGATIVE NEGATIVE   Bilirubin Urine NEGATIVE NEGATIVE   Ketones, ur NEGATIVE NEGATIVE mg/dL   Protein, ur 30 (A) NEGATIVE mg/dL   Nitrite NEGATIVE NEGATIVE   Leukocytes,Ua NEGATIVE NEGATIVE   RBC / HPF 0-5 0 - 5 RBC/hpf   WBC, UA 11-20 0 - 5 WBC/hpf   Bacteria, UA NONE SEEN NONE SEEN   Squamous Epithelial / HPF 0-5 0 - 5 /HPF   Mucus PRESENT     Comment: Performed at [facility], [address] [place], [place] [address]  Lipase, blood     Status: None   Collection Time: 09/25/24  8:56 PM  Result Value Ref Range   Lipase 24 11 - 51 U/L    Comment: Performed at [facility], [address], [place], [place] [address]  Comprehensive metabolic panel     Status: Abnormal   Collection Time: 09/25/24  8:56 PM  Result Value Ref Range   Sodium 137 135 - 145 mmol/L   Potassium 4.0 3.5 - 5.1 mmol/L   Chloride 103 98 - 111 mmol/L   CO2 24 22 - 32 mmol/L   Glucose, Bld 100 (H) 70 - 99 mg/dL    Comment: Glucose reference range applies only to samples taken after fasting for at least 8 hours.   BUN 24 (H) 8 - 23 mg/dL   Creatinine, Ser 8.80 0.61 - 1.24 mg/dL   Calcium  9.1 8.9 - 10.3 mg/dL   Total Protein 7.1 6.5 - 8.1 g/dL   Albumin  3.9 3.5 - 5.0 g/dL   AST 22 15 - 41 U/L   ALT 12 0 - 44 U/L   Alkaline Phosphatase 95 38 - 126 U/L   Total Bilirubin 0.3 0.0 - 1.2 mg/dL   GFR, Estimated >39 >39 mL/min    Comment: (NOTE) Calculated using the CKD-EPI Creatinine Equation (2021)    Anion gap 10 5 - 15    Comment: Performed at [facility], [address] [place], [place] [address]  CBC     Status: Abnormal   Collection Time: 09/25/24  8:56 PM  Result Value Ref Range   WBC 9.2 4.0 - 10.5 K/uL   RBC 4.37 4.22 - 5.81 MIL/uL   Hemoglobin 11.5 (L) 13.0 - 17.0 g/dL   HCT 62.9 (L) 60.9 - 47.9 %   MCV 84.7 80.0 - 100.0 fL   MCH 26.3 26.0 - 34.0 pg   MCHC 31.1 30.0 - 36.0 g/dL   RDW 84.0 (H) 88.4 - 84.4 %   Platelets 184 150 - 400 K/uL   nRBC 0.0 0.0 - 0.2 %    Comment: Performed at [facility], [address], [place], [place] [address]  Troponin T, High Sensitivity     Status: None   Collection Time: 09/25/24  8:56 PM  Result Value Ref Range   Troponin T High Sensitivity 16 0 - 19 ng/L    Comment: (NOTE) Biotin concentrations > 1000 ng/mL falsely decrease TnT results.  Serial cardiac troponin measurements are suggested.  Refer to the Links section for chest pain algorithms and additional  guidance. Performed at [facility], [address] [place], [place] [address]   CBG monitoring, ED     Status: Abnormal   Collection Time: 09/25/24  9:00 PM  Result  Value Ref Range   Glucose-Capillary 179 (H) 70 - 99 mg/dL    Comment: Glucose reference range applies only to samples taken after fasting for at least 8 hours.  Comprehensive metabolic panel     Status: Abnormal   Collection Time: 09/26/24  4:30 AM  Result Value Ref Range   Sodium 137 135 - 145 mmol/L   Potassium 3.8 3.5 - 5.1 mmol/L   Chloride 104 98 - 111 mmol/L   CO2 24 22 - 32 mmol/L   Glucose, Bld 103 (H) 70 - 99 mg/dL    Comment: Glucose reference range applies only to samples taken after fasting for at least 8 hours.   BUN 15 8 - 23 mg/dL   Creatinine, Ser 9.17 0.61 - 1.24 mg/dL   Calcium  9.0 8.9 - 10.3 mg/dL   Total Protein 6.7 6.5 - 8.1 g/dL   Albumin  3.6 3.5 - 5.0 g/dL   AST 20 15 - 41 U/L   ALT 12 0 - 44 U/L   Alkaline Phosphatase 92 38 - 126 U/L   Total Bilirubin 0.3 0.0 - 1.2 mg/dL  GFR, Estimated >60 >60 mL/min    Comment: (NOTE) Calculated using the CKD-EPI Creatinine Equation (2021)    Anion gap 9 5 - 15    Comment: Performed at [facility], [address], [place], [place] [address]  Magnesium      Status: None   Collection Time: 09/26/24  4:30 AM  Result Value Ref Range   Magnesium  2.2 1.7 - 2.4 mg/dL    Comment: Performed at [facility], [address], [place], [place] [address]  CBC     Status: Abnormal   Collection Time: 09/26/24  4:30 AM  Result Value Ref Range   WBC 9.2 4.0 - 10.5 K/uL   RBC 4.16 (L) 4.22 - 5.81 MIL/uL   Hemoglobin 10.9 (L) 13.0 - 17.0 g/dL   HCT 64.8 (L) 60.9 - 47.9 %   MCV 84.4 80.0 - 100.0 fL   MCH 26.2 26.0 - 34.0 pg   MCHC 31.1 30.0 - 36.0 g/dL   RDW 84.1 (H) 88.4 - 84.4 %   Platelets 161 150 - 400 K/uL   nRBC 0.0 0.0 - 0.2 %    Comment: Performed at [facility], [address] [place], [place] [address]   Personally reviewed and showed the patient/wife. Reviewed CT from September too.  Gallstones throughout gallbladder, enlarged CBD noted, some more haziness/ stranding around the CBD/ duodenum area compared to September   CT ABDOMEN PELVIS W CONTRAST Result Date: 09/25/2024 CLINICAL DATA:  Acute onset upper abdominal pain, initial encounter EXAM: CT ABDOMEN AND PELVIS WITH CONTRAST TECHNIQUE: Multidetector CT imaging of the abdomen and pelvis was performed using the standard protocol following bolus administration of intravenous contrast. RADIATION DOSE REDUCTION: This exam was performed according to the departmental dose-optimization program which includes automated exposure control, adjustment of the mA and/or kV according to patient size and/or use of iterative reconstruction technique. CONTRAST:  OMNIPAQUE  IOHEXOL  300 MG/ML  SOLN COMPARISON:  08/21/2024 ultrasound, CT from 08/20/2024 FINDINGS: Lower chest: No acute abnormality. Hepatobiliary: Scattered cysts are noted within the liver. The gallbladder is well distended with multiple small gallstones. Persistent inflammatory changes noted surrounding the common bile duct and central gallbladder as well as the second portion of the duodenum. This is increased when compared with the prior exam. Pancreas: Pancreas is within normal limits. Spleen: Normal in size without focal abnormality. Calcified granulomas are again seen. Adrenals/Urinary Tract: Adrenal glands are within normal limits. Partially calcified cysts are noted within the kidneys bilaterally stable from the prior exam. No follow-up is recommended. No renal calculi are seen. No obstructive changes are noted. The bladder is partially distended. Some wall thickening of the bladder is noted stable from the prior exam. Stomach/Bowel: Scattered diverticular change of the colon is noted without evidence of diverticulitis. The appendix is within normal limits. Stomach is unremarkable. Inflammatory changes surrounding the second portion of the duodenum are seen. Vascular/Lymphatic: Aortic atherosclerosis. No enlarged abdominal or pelvic lymph nodes. Reproductive: Prostate is unremarkable. Other: No abdominal wall hernia  or abnormality. No abdominopelvic ascites. Musculoskeletal: No acute or significant osseous findings. IMPRESSION: Cholelithiasis. Prominence of the common bile duct is again identified and stable. Inflammatory changes in the right upper quadrant near the common bile duct and second portion of the duodenum. These have increased in the interval from the prior exam and again may represent acute cholecystitis although the possibility of duodenitis deserves consideration as well. Diverticulosis without diverticulitis. Bladder wall thickening stable from the prior exam which may represent cystitis. Electronically Signed  By: Oneil Devonshire M.D.   On: 09/25/2024 23:30     Assessment & Plan:  Kyle Matthews is a 78 y.o. male with gallstones in the gallbladder and concern for some inflammation in the duodenum/CBD area. CT done but US  is pending still. Patient has gallstones and has been symptomatic. He could have acute cholecystitis but US  is not done yet. There is also some concern it could be duodenitis from the CT scan.  He could have passed a stone given his pain and size of the CBD but this is a chronically enlarged CBD and his LFTs are normal.   I have talked to the patient, wife and team. He has no leukocytosis, his LFTs are normal. His symptoms are improving. His CT scan is slightly worse and we will see what his US  shows. Discussed that with his cardiac history and anticoagulation that I do no think we need to rush into any procedure at this time and that cholecystectomy at [facility] may not be advisable given limited Cardiology availability 8-5pm on weekdays.  He has already seen Dr. Vernetta and his cardiologist who wanted him on anticoagulation for at least 1 month from the cardioversion ~ 10/13/24.    -At this time, he is not needing emergent surgery and we have time to sort things out and determine if he truly has cholecystitis or not.  -US  pending, May need HIDA pending the US  read -Continue zosyn  for  now  -Heparin  gtt for anticoagulation  -Discussed that antibiotics may be needed if he has cholecystitis to get him through this episode versus cholecystostomy tube if he were worsening clinically but I would want Dr. Vernetta aware and in agreement before making any of those recommendations.  -If the US  and HIDA are negative, it is possible this is duodenitis, and would need to hold on Cholecystectomy at this time. I have changed protonix  to BID dosing pending any duodenitis.    All questions were answered to the satisfaction of the patient and family.   Kyle Matthews 09/26/2024, 10:16 AM

## 2024-09-26 NOTE — Progress Notes (Signed)
 PHARMACY - ANTICOAGULATION CONSULT NOTE  Pharmacy Consult for eliquis  >> heparin  Indication: atrial fibrillation  Allergies  Allergen Reactions   Tetanus Toxoid-Containing Vaccines Other (See Comments)    Childhood Allergy    Zetia  [Ezetimibe ] Diarrhea    Patient Measurements: Height: 5' 3 (160 cm) Weight: 70.3 kg (155 lb) IBW/kg (Calculated) : 56.9 HEPARIN  DW (KG): 70.3  Vital Signs: Temp: 98.3 F (36.8 C) (10/08 2032) Temp Source: Oral (10/08 2032) BP: 140/83 (10/08 2032) Pulse Rate: 86 (10/08 2032)  Labs: Recent Labs    09/25/24 2056  HGB 11.5*  HCT 37.0*  PLT 184  CREATININE 1.19    Estimated Creatinine Clearance: 46.5 mL/min (by C-G formula based on SCr of 1.19 mg/dL).   Medical History: Past Medical History:  Diagnosis Date   Anemia    Anxiety    Arthritis    BPH (benign prostatic hyperplasia)    Status post biopsy in 2009. - w/ LUTS   Coronary artery disease    a. s/p CABG in 10/2023 with LIMA-LAD and SVG-Ramus at the time of AVR   Depression    Essential hypertension    Hyperlipidemia    On Crestor  - followed by PCP   Moderate calcific aortic stenosis 04/2017   a. s/p AVR with 23 mm Edwards Inspiris Resilia pericardial valve in 10/2023   Stroke (HCC) 2007   No true etiology noted   UTI (urinary tract infection)    Assessment: 49 yoM presented with abdominal pain found to have cholecystitis vs duodenitis on CT. Pharmacy consulted to dose heparin  for atrial fibrillation pending surgery. PMH includes afib s/p cardioversion on  9/26 and on eliquis .  -Last dose of eliquis : 09/25/2024 @1000  -Hgb 11.5, plts 184  Goal of Therapy:  Heparin  level 0.3-0.7 units/ml Monitor platelets by anticoagulation protocol: Yes   Plan:  No bolus given recent eliquis  use Start heparin  infusion at 1000 units/hr Check anti-Xa level and aPTT in 8 hours and daily while on heparin  Monitor with aPTTs until correlates with heparin  level Continue to monitor H&H and  platelets Follow surgery consult   Lynwood Poplar, PharmD, BCPS Clinical Pharmacist 09/26/2024 2:14 AM

## 2024-09-26 NOTE — Progress Notes (Signed)
 PHARMACY - ANTICOAGULATION CONSULT NOTE  Pharmacy Consult for eliquis  >> heparin  Indication: atrial fibrillation  Allergies  Allergen Reactions   Tetanus Toxoid-Containing Vaccines Other (See Comments)    Childhood Allergy    Zetia  [Ezetimibe ] Diarrhea    Patient Measurements: Height: 5' 3 (160 cm) Weight: 70.3 kg (155 lb) IBW/kg (Calculated) : 56.9 HEPARIN  DW (KG): 70.3  Vital Signs: Temp: 98.9 F (37.2 C) (10/09 1123) Temp Source: Oral (10/09 1123) BP: 128/74 (10/09 1123) Pulse Rate: 86 (10/09 1123)  Labs: Recent Labs    09/25/24 2056 09/26/24 0430 09/26/24 1011  HGB 11.5* 10.9*  --   HCT 37.0* 35.1*  --   PLT 184 161  --   APTT  --   --  38*  HEPARINUNFRC  --   --  0.69  CREATININE 1.19 0.82  --     Estimated Creatinine Clearance: 67.5 mL/min (by C-G formula based on SCr of 0.82 mg/dL).   Medical History: Past Medical History:  Diagnosis Date   Anemia    Anxiety    Arthritis    BPH (benign prostatic hyperplasia)    Status post biopsy in 2009. - w/ LUTS   Coronary artery disease    a. 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 Pharmacy consulted to dose heparin  for atrial fibrillation pending surgery. PMH includes afib s/p cardioversion on  9/26 and on eliquis .  -Last dose of eliquis : 09/25/2024  @1000  CBC WNL aPTT 38- subtherapeutic HL 0.69- therapeutic but still affected by previous Eliquis   Goal of Therapy:  Heparin  level 0.3-0.7 units/ml Monitor platelets by anticoagulation protocol: Yes   Plan:  Heparin  2000 units IV x 1 Increase heparin  infusion to 1200 units/hr Check anti-Xa level and aPTT in 8 hours and daily while on heparin  Monitor with aPTTs until correlates with heparin  level Continue to monitor H&H and platelets Follow surgery consult   Elspeth Sour, PharmD Clinical Pharmacist 09/26/2024 12:18 PM

## 2024-09-26 NOTE — ED Notes (Signed)
 Attempted to call report and was told they were going to have to call back when they could

## 2024-09-26 NOTE — Subjective & Objective (Signed)
 Pt seen and examined. Going for MRCP today. Remains on IV heparin . Much less abd pain compared to yesterday.

## 2024-09-26 NOTE — Care Plan (Signed)
  Device system confirmed to be MRI conditional, with implant date > 6 weeks ago, and no evidence of abandoned or epicardial leads in review of most recent CXR  Device last cleared by EP Provider: Evetta Needle  09/26/2024   Clearance is good through for 1 year as long as parameters remain stable at time of check. If pt undergoes a cardiac device procedure during that time, they should be re-cleared.   Tachy-therapies to be programmed off if applicable with device back to pre-MRI settings after completion of exam.  [employer] will be present for programming for the MRI.   Ida Velma Righter  09/26/2024 2:06 PM

## 2024-09-26 NOTE — Assessment & Plan Note (Addendum)
 09/26/24 stable.  On Crestor .  09/27/24 stable

## 2024-09-26 NOTE — Progress Notes (Signed)
 [facility]  US  done and report reviewed.  I have talked to Dr. Ramond and discussed the imaging. The odd appearance of a possible low attaching cystic duct with the CT scan imaging concern for inflammation around the CBD/ duodenum. He is also concerned there may be a pocket of fluid near the pancreatic head. There is no significant peri-cholecystic fluid or signs outside of the wall thickening for acute cholecystitis. He has pain in the RUQ/epigastric region. He could have an ulcer, duodenal diverticula given the fluid pocket although the area Dr. Ramond sees has no gas in it.    On my review of the CT on axial image 30/85 there is almost three ducts seen on the CT scan. The coronal image 38/90 is also showing this and I think the most lateral pocket is the fluid pocket Dr. Ramond is talking about.   I am going to order an MRCP to better understand what is going on. I am so ran this by GI colleagues that agrees to get MRI Abd/ MRCP as he thinks this could be some concern for divisum versus choledochal cyst.    I have ordered. Dr. 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   Manuelita Pander, MD [facility] [address] [place], [place] [address] [phone] (office)

## 2024-09-26 NOTE — Progress Notes (Signed)
 [facility]  Abbot is coming to his MRI tomorrow at 10AM. MRI recommending 4hrs NPO. Will make NPO at 5AM.  Continue heparin  gtt and antibiotics for now.   Manuelita Pander, MD [facility] [address] [place], [place] [address] [phone] (office)

## 2024-09-26 NOTE — Progress Notes (Signed)
   09/26/24 1518  TOC Brief Assessment  Insurance and Status Reviewed  Patient has primary care physician Yes  Home environment has been reviewed Home with Spouse  Prior level of function: Independent  Prior/Current Home Services No current home services  Social Drivers of Health Review SDOH reviewed no interventions necessary  Readmission risk has been reviewed Yes  Transition of care needs no transition of care needs at this time   Inpatient Care Manager (ICM) has reviewed patient and no ICM needs have been identified at this time. We will continue to monitor patient advancement through interdisciplinary progression rounds. If new patient transition needs arise, please place a ICM consult.

## 2024-09-26 NOTE — Assessment & Plan Note (Addendum)
 09/26/24 was recently cardioverted on September 13, 2024.  Was on Eliquis  post cardioversion.  Now on IV heparin .  09/27/24 remains on IV heparin .

## 2024-09-27 ENCOUNTER — Encounter (HOSPITAL_COMMUNITY): Payer: Self-pay | Admitting: Family Medicine

## 2024-09-27 ENCOUNTER — Inpatient Hospital Stay (HOSPITAL_COMMUNITY)

## 2024-09-27 DIAGNOSIS — K8020 Calculus of gallbladder without cholecystitis without obstruction: Secondary | ICD-10-CM | POA: Diagnosis not present

## 2024-09-27 DIAGNOSIS — K82A2 Perforation of gallbladder in cholecystitis: Secondary | ICD-10-CM

## 2024-09-27 DIAGNOSIS — I2510 Atherosclerotic heart disease of native coronary artery without angina pectoris: Secondary | ICD-10-CM | POA: Diagnosis not present

## 2024-09-27 DIAGNOSIS — I4891 Unspecified atrial fibrillation: Secondary | ICD-10-CM | POA: Diagnosis not present

## 2024-09-27 DIAGNOSIS — I5032 Chronic diastolic (congestive) heart failure: Secondary | ICD-10-CM | POA: Diagnosis not present

## 2024-09-27 LAB — CBC
HCT: 35.8 % — ABNORMAL LOW (ref 39.0–52.0)
Hemoglobin: 11.1 g/dL — ABNORMAL LOW (ref 13.0–17.0)
MCH: 26.4 pg (ref 26.0–34.0)
MCHC: 31 g/dL (ref 30.0–36.0)
MCV: 85 fL (ref 80.0–100.0)
Platelets: 159 K/uL (ref 150–400)
RBC: 4.21 MIL/uL — ABNORMAL LOW (ref 4.22–5.81)
RDW: 16 % — ABNORMAL HIGH (ref 11.5–15.5)
WBC: 8.8 K/uL (ref 4.0–10.5)
nRBC: 0 % (ref 0.0–0.2)

## 2024-09-27 LAB — COMPREHENSIVE METABOLIC PANEL WITH GFR
ALT: 10 U/L (ref 0–44)
AST: 17 U/L (ref 15–41)
Albumin: 3.8 g/dL (ref 3.5–5.0)
Alkaline Phosphatase: 86 U/L (ref 38–126)
Anion gap: 10 (ref 5–15)
BUN: 14 mg/dL (ref 8–23)
CO2: 23 mmol/L (ref 22–32)
Calcium: 8.9 mg/dL (ref 8.9–10.3)
Chloride: 102 mmol/L (ref 98–111)
Creatinine, Ser: 0.89 mg/dL (ref 0.61–1.24)
GFR, Estimated: >60 mL/min (ref >60)
Glucose, Bld: 87 mg/dL (ref 70–99)
Potassium: 3.5 mmol/L (ref 3.5–5.1)
Sodium: 135 mmol/L (ref 135–145)
Total Bilirubin: 0.4 mg/dL (ref 0.0–1.2)
Total Protein: 6.8 g/dL (ref 6.5–8.1)

## 2024-09-27 LAB — APTT
aPTT: 149 s — ABNORMAL HIGH (ref 24–36)
aPTT: 94 s — ABNORMAL HIGH (ref 24–36)
aPTT: 67 s — ABNORMAL HIGH (ref 24–36)

## 2024-09-27 LAB — HEPARIN LEVEL (UNFRACTIONATED): Heparin Unfractionated: 1.01 [IU]/mL — ABNORMAL HIGH (ref 0.30–0.70)

## 2024-09-27 MED ORDER — GADOBUTROL 1 MMOL/ML IV SOLN
7.0000 mL | Freq: Once | INTRAVENOUS | Status: AC | PRN
  Administered 2024-09-27: 7 mL via INTRAVENOUS

## 2024-09-27 MED ORDER — FUROSEMIDE 40 MG PO TABS
40.0000 mg | ORAL_TABLET | Freq: Every day | ORAL | Status: DC
  Administered 2024-09-28: 40 mg via ORAL
  Filled 2024-09-27 (×2): qty 1

## 2024-09-27 NOTE — Progress Notes (Signed)
 Patient was monitored by this RN during MRI scan due to the presence of a pacemaker. Cardiac rhythm was continuously monitored throughout the procedure. Prior to the start of the scan, the pacemaker was placed in MRI-safe mode by the [employer] pacemaker representative. Following completion of the scan, the device was returned to its pre-MRI settings. Neurological status and orientation post-procedure were unchanged from baseline.  Pre-procedure Heart Rate 1051 (Prior to being placed in MRI safe mode): 89    Post-procedure Heart Rate 1130 (Once pacemaker is returned to baseline mode): 89

## 2024-09-27 NOTE — Progress Notes (Addendum)
 [facility] Progress Note     Subjective: Pain improving. MRCP reviewed and results discussed with Dr. Ebbie given concern for contained perforation. Updated the patient and the wife and showed them the images. Discussed that given this and the concern he will be transferred down for further workup/ possible IR drainage and Dr. Philbert Dr. Gelene team can see him and help manage.    He says his pain is better. He is tolerating clears.   Objective: Vital signs in last 24 hours: Temp:  [97.8 F (36.6 C)-99.8 F (37.7 C)] 98.3 F (36.8 C) (10/10 1313) Pulse Rate:  [80-96] 85 (10/10 1313) Resp:  [17-20] 17 (10/10 1313) BP: (124-129)/(69-82) 125/69 (10/10 1313) SpO2:  [94 %-99 %] 97 % (10/10 1313) Last BM Date : 09/25/24  Intake/Output from previous day: 10/09 0701 - 10/10 0700 In: 343.6 [I.V.:212.5; IV Piggyback:131.1] Out: 400 [Urine:400] Intake/Output this shift: No intake/output data recorded.  General appearance: alert and no distress Resp: normal work of breathing GI: soft, right upper quadrant pain improved, minor tenderness continues  Lab Results:  Recent Labs    09/26/24 0430 09/27/24 0431  WBC 9.2 8.8  HGB 10.9* 11.1*  HCT 35.1* 35.8*  PLT 161 159   BMET Recent Labs    09/26/24 0430 09/27/24 0431  NA 137 135  K 3.8 3.5  CL 104 102  CO2 24 23  GLUCOSE 103* 87  BUN 15 14  CREATININE 0.82 0.89  CALCIUM  9.0 8.9   PT/INR No results for input(s): LABPROT, INR in the last 72 hours.  Studies/Results: MR ABDOMEN MRCP W WO CONTAST Result Date: 09/27/2024 CLINICAL DATA:  801171 Common bile duct dilatation 801171; 220388 Abnormal CT of the abdomen 220388 EXAM: MRI ABDOMEN WITHOUT AND WITH CONTRAST (INCLUDING MRCP) TECHNIQUE: Multiplanar multisequence MR imaging of the abdomen was performed both before and after the administration of intravenous contrast. Heavily T2-weighted images of the biliary and pancreatic ducts were  obtained, and three-dimensional MRCP images were rendered by post processing. CONTRAST:  7mL GADAVIST GADOBUTROL 1 MMOL/ML IV SOLN COMPARISON:  Ultrasound abdomen from 09/26/2024 and CT scan abdomen and pelvis from 09/25/2024. FINDINGS: Lower chest: Unremarkable MR appearance to the lung bases. No pleural effusion. No pericardial effusion. Normal heart size. Hepatobiliary: The liver is normal in size. Noncirrhotic configuration. There are 2 simple cysts in the liver with largest in the right hepatic lobe measuring up to 2.1 x 3.3 cm. No suspicious liver lesion. No intrahepatic bile duct dilation. The extrahepatic bile duct is slightly prominent measuring up to 8 mm in the proximal portion, 8 mm in the midportion and gradually tapers up to 6 mm just before the ampulla of Vater. The extrahepatic bile duct diameters are within normal limits for patient's age. No obstructing mass or choledocholithiasis. The gallbladder is physiologically distended and exhibit moderate-to-large amount of innumerable sub 5 mm gallstones. No abnormal gallbladder wall thickening. However, there are 3 adjacent mildly thick enhancing wall collections to the left of the gallbladder and posterior to the extrahepatic bile duct with largest collection measuring up to 1.6 x 2.7 cm (series 34, images 41-46). These collections are grossly similar to the prior study from 09/25/2024 but new since the prior study from 08/20/2024. Collections are adjacent to/inseparable from the gallbladder neck/cystic duct. There is also fat stranding in the right upper quadrant centered around this region. There is apparent transient hepatic intensity difference in the surrounding liver parenchyma (series 21, images 41-45). The duodenal, pancreatic head/uncinate process, right adrenal  and right kidney appears within normal limits and likely not the source of inflammation. These constellation of findings raises the concern for probable small contained perforation of  the gallbladder neck/cystic duct region. Correlate clinically. Pancreas: Normal T1 hyperintense appearance of the pancreas. There is a 1.1 x 1.2 cm multilobulated T2 hyperintense, nonenhancing structure in the uncinate process. There is an additional, 6 x 6 mm similar characteristic structure in the pancreas head/uncinate junction region (series 4, image 20). No direct communication of the structures noted with the pancreatic side branch or main duct. Differential diagnosis includes pancreatic side branch IPMN or pseudocyst. Attention on follow-up examination is recommended. The pancreas is otherwise unremarkable. Main pancreatic duct is not dilated. No peripancreatic fat stranding. Spleen:  Within normal limits in size and appearance. No focal mass. Adrenals/Urinary Tract: Unremarkable adrenal glands. No hydroureteronephrosis on either side. There are multiple bilateral renal cysts with largest arising from the right kidney upper pole measuring up to 5.2 x 6.2 cm. There are also multiple structures with varying amount of T1 hyperintense components with largest T1 hyperintense structure arising from the left kidney interpolar region, anteriorly measuring up to 1.4 x 2.0 cm, which can be characterized as a proteinaceous/hemorrhagic cysts. The other structures with T1 hyperintense contents and lack of enhancement also favor small quantity of dependent proteinaceous/hemorrhagic contents. No suspicious renal mass. Stomach/Bowel: Visualized portions within the abdomen are unremarkable. No disproportionate dilation of bowel loops. Vascular/Lymphatic: No pathologically enlarged lymph nodes identified. No abdominal aortic aneurysm demonstrated. There is trace amount of ascites along the right posteroinferior liver surface Other:  None. Musculoskeletal: No suspicious bone lesions identified. IMPRESSION: 1. There are 3 adjacent mildly thick enhancing walled collections to the left of the gallbladder and posterior to the  extrahepatic bile duct with largest collection measuring up to 1.6 x 2.7 cm. These collections are adjacent to/inseparable from the gallbladder neck/cystic duct. There is also fat stranding in the right upper quadrant centered around this region. There is apparent transient hepatic intensity difference in the surrounding liver parenchyma. The duodenum, pancreatic head/uncinate process, right adrenal and right kidney appears within normal limits and likely not the source of inflammation. These constellation of findings raises the concern for probable small contained perforation of the gallbladder neck/cystic duct region with resultant mild complex collection. Correlate clinically. 2. There is a 1.1 x 1.2 cm multilobulated T2 hyperintense, nonenhancing structure in the uncinate process. There is an additional, 6 x 6 mm similar characteristic structure in the pancreas head/uncinate junction region. No direct communication of the structures noted with the pancreatic side branch or main duct. Differential diagnosis includes pancreatic side branch IPMN or pseudocyst. Attention on follow-up examination is recommended. 3. Multiple bilateral renal simple renal cysts as well as cysts with proteinaceous/hemorrhagic contents. No suspicious renal mass. 4. Multiple other nonacute observations, as described above. Electronically Signed   By: Ree Molt M.D.   On: 09/27/2024 12:17   MR 3D Recon At Scanner Result Date: 09/27/2024 CLINICAL DATA:  801171 Common bile duct dilatation 801171; 220388 Abnormal CT of the abdomen 220388 EXAM: MRI ABDOMEN WITHOUT AND WITH CONTRAST (INCLUDING MRCP) TECHNIQUE: Multiplanar multisequence MR imaging of the abdomen was performed both before and after the administration of intravenous contrast. Heavily T2-weighted images of the biliary and pancreatic ducts were obtained, and three-dimensional MRCP images were rendered by post processing. CONTRAST:  7mL GADAVIST GADOBUTROL 1 MMOL/ML IV SOLN  COMPARISON:  Ultrasound abdomen from 09/26/2024 and CT scan abdomen and pelvis from 09/25/2024. FINDINGS: Lower  chest: Unremarkable MR appearance to the lung bases. No pleural effusion. No pericardial effusion. Normal heart size. Hepatobiliary: The liver is normal in size. Noncirrhotic configuration. There are 2 simple cysts in the liver with largest in the right hepatic lobe measuring up to 2.1 x 3.3 cm. No suspicious liver lesion. No intrahepatic bile duct dilation. The extrahepatic bile duct is slightly prominent measuring up to 8 mm in the proximal portion, 8 mm in the midportion and gradually tapers up to 6 mm just before the ampulla of Vater. The extrahepatic bile duct diameters are within normal limits for patient's age. No obstructing mass or choledocholithiasis. The gallbladder is physiologically distended and exhibit moderate-to-large amount of innumerable sub 5 mm gallstones. No abnormal gallbladder wall thickening. However, there are 3 adjacent mildly thick enhancing wall collections to the left of the gallbladder and posterior to the extrahepatic bile duct with largest collection measuring up to 1.6 x 2.7 cm (series 34, images 41-46). These collections are grossly similar to the prior study from 09/25/2024 but new since the prior study from 08/20/2024. Collections are adjacent to/inseparable from the gallbladder neck/cystic duct. There is also fat stranding in the right upper quadrant centered around this region. There is apparent transient hepatic intensity difference in the surrounding liver parenchyma (series 21, images 41-45). The duodenal, pancreatic head/uncinate process, right adrenal and right kidney appears within normal limits and likely not the source of inflammation. These constellation of findings raises the concern for probable small contained perforation of the gallbladder neck/cystic duct region. Correlate clinically. Pancreas: Normal T1 hyperintense appearance of the pancreas. There is  a 1.1 x 1.2 cm multilobulated T2 hyperintense, nonenhancing structure in the uncinate process. There is an additional, 6 x 6 mm similar characteristic structure in the pancreas head/uncinate junction region (series 4, image 20). No direct communication of the structures noted with the pancreatic side branch or main duct. Differential diagnosis includes pancreatic side branch IPMN or pseudocyst. Attention on follow-up examination is recommended. The pancreas is otherwise unremarkable. Main pancreatic duct is not dilated. No peripancreatic fat stranding. Spleen:  Within normal limits in size and appearance. No focal mass. Adrenals/Urinary Tract: Unremarkable adrenal glands. No hydroureteronephrosis on either side. There are multiple bilateral renal cysts with largest arising from the right kidney upper pole measuring up to 5.2 x 6.2 cm. There are also multiple structures with varying amount of T1 hyperintense components with largest T1 hyperintense structure arising from the left kidney interpolar region, anteriorly measuring up to 1.4 x 2.0 cm, which can be characterized as a proteinaceous/hemorrhagic cysts. The other structures with T1 hyperintense contents and lack of enhancement also favor small quantity of dependent proteinaceous/hemorrhagic contents. No suspicious renal mass. Stomach/Bowel: Visualized portions within the abdomen are unremarkable. No disproportionate dilation of bowel loops. Vascular/Lymphatic: No pathologically enlarged lymph nodes identified. No abdominal aortic aneurysm demonstrated. There is trace amount of ascites along the right posteroinferior liver surface Other:  None. Musculoskeletal: No suspicious bone lesions identified. IMPRESSION: 1. There are 3 adjacent mildly thick enhancing walled collections to the left of the gallbladder and posterior to the extrahepatic bile duct with largest collection measuring up to 1.6 x 2.7 cm. These collections are adjacent to/inseparable from the  gallbladder neck/cystic duct. There is also fat stranding in the right upper quadrant centered around this region. There is apparent transient hepatic intensity difference in the surrounding liver parenchyma. The duodenum, pancreatic head/uncinate process, right adrenal and right kidney appears within normal limits and likely not the source of  inflammation. These constellation of findings raises the concern for probable small contained perforation of the gallbladder neck/cystic duct region with resultant mild complex collection. Correlate clinically. 2. There is a 1.1 x 1.2 cm multilobulated T2 hyperintense, nonenhancing structure in the uncinate process. There is an additional, 6 x 6 mm similar characteristic structure in the pancreas head/uncinate junction region. No direct communication of the structures noted with the pancreatic side branch or main duct. Differential diagnosis includes pancreatic side branch IPMN or pseudocyst. Attention on follow-up examination is recommended. 3. Multiple bilateral renal simple renal cysts as well as cysts with proteinaceous/hemorrhagic contents. No suspicious renal mass. 4. Multiple other nonacute observations, as described above. Electronically Signed   By: Ree Molt M.D.   On: 09/27/2024 12:17   US  Abdomen Limited RUQ (LIVER/GB) Result Date: 09/26/2024 EXAM: Right Upper Quadrant Abdominal Ultrasound TECHNIQUE: Real-time ultrasonography of the right upper quadrant of the abdomen was performed. COMPARISON: None. CLINICAL HISTORY: Symptomatic cholelithiasis. FINDINGS: LIVER: The liver demonstrates normal echogenicity. No intrahepatic biliary ductal dilatation. No mass. A 3.1 x 2.6 x 2.9 cm lesion is present in the right hepatic lobe, demonstrating fluid echogenicity, which may represent 2 adjacent cysts or a cystic lesion with a small single internal septation, such as cystadenoma. BILIARY SYSTEM: Numerous layering gallstones are filling much of the gallbladder. Mild  gallbladder wall thickening is present, measuring 0.3 cm. The common bile duct measures 0.4 cm proximally and may be dilated distally, although this appearance of dilation distally may be due to the confluence of a low-attaching cystic duct with the common hepatic duct; this could be better characterized at MRI if clinically warranted. OTHER: No right upper quadrant ascites. IMPRESSION: 1. Cholelithiasis with mild gallbladder wall thickening. 2. Possible distal common bile duct dilatation; consider MRCP for further evaluation if clinically warranted. 3. 3.1 x 2.6 x 2.9 cm right hepatic lobe cystic lesion, which may represent adjacent simple cysts versus a septated cystic lesion such as cystadenoma. Electronically signed by: Ryan Salvage MD 09/26/2024 10:18 AM EDT RP Workstation: [id number]   CT ABDOMEN PELVIS W CONTRAST Result Date: 09/25/2024 CLINICAL DATA:  Acute onset upper abdominal pain, initial encounter EXAM: CT ABDOMEN AND PELVIS WITH CONTRAST TECHNIQUE: Multidetector CT imaging of the abdomen and pelvis was performed using the standard protocol following bolus administration of intravenous contrast. RADIATION DOSE REDUCTION: This exam was performed according to the departmental dose-optimization program which includes automated exposure control, adjustment of the mA and/or kV according to patient size and/or use of iterative reconstruction technique. CONTRAST:  OMNIPAQUE  IOHEXOL  300 MG/ML  SOLN COMPARISON:  08/21/2024 ultrasound, CT from 08/20/2024 FINDINGS: Lower chest: No acute abnormality. Hepatobiliary: Scattered cysts are noted within the liver. The gallbladder is well distended with multiple small gallstones. Persistent inflammatory changes noted surrounding the common bile duct and central gallbladder as well as the second portion of the duodenum. This is increased when compared with the prior exam. Pancreas: Pancreas is within normal limits. Spleen: Normal in size without focal  abnormality. Calcified granulomas are again seen. Adrenals/Urinary Tract: Adrenal glands are within normal limits. Partially calcified cysts are noted within the kidneys bilaterally stable from the prior exam. No follow-up is recommended. No renal calculi are seen. No obstructive changes are noted. The bladder is partially distended. Some wall thickening of the bladder is noted stable from the prior exam. Stomach/Bowel: Scattered diverticular change of the colon is noted without evidence of diverticulitis. The appendix is within normal limits. Stomach is unremarkable. Inflammatory changes surrounding the  second portion of the duodenum are seen. Vascular/Lymphatic: Aortic atherosclerosis. No enlarged abdominal or pelvic lymph nodes. Reproductive: Prostate is unremarkable. Other: No abdominal wall hernia or abnormality. No abdominopelvic ascites. Musculoskeletal: No acute or significant osseous findings. IMPRESSION: Cholelithiasis. Prominence of the common bile duct is again identified and stable. Inflammatory changes in the right upper quadrant near the common bile duct and second portion of the duodenum. These have increased in the interval from the prior exam and again may represent acute cholecystitis although the possibility of duodenitis deserves consideration as well. Diverticulosis without diverticulitis. Bladder wall thickening stable from the prior exam which may represent cystitis. Electronically Signed   By: Oneil Devonshire M.D.   On: 09/25/2024 23:30    Anti-infectives: Anti-infectives (From admission, onward)    Start     Dose/Rate Route Frequency Ordered Stop   09/26/24 0600  piperacillin -tazobactam (ZOSYN ) IVPB 3.375 g        3.375 g 12.5 mL/hr over 240 Minutes Intravenous Every 8 hours 09/26/24 0207     09/26/24 0000  metroNIDAZOLE (FLAGYL) tablet 500 mg        500 mg Oral  Once 09/25/24 2348 09/26/24 0058   09/25/24 2345  cefTRIAXone  (ROCEPHIN ) 2 g in sodium chloride  0.9 % 100 mL IVPB         2 g 200 mL/hr over 30 Minutes Intravenous  Once 09/25/24 2341 09/26/24 0054       Assessment/Plan: Patient with MRCP concerning for possible perforation of the gallbladder. Patient is not acting like someone with a gallbladder perforation as he has a normal leukocytosis and normal LFTs. He could have a contained perforation or something else could be going on. He has inflammation in the area. Discussed that he is likely to get more workup and get more testing. Discussed that he will stay on antibiotics and go down to [place]. Discussed potential IR drain.   He and his wife are understanding. Clear diet for now Transfer down Antibiotics IR/ Possible drain Further workup /plans per [facility]/ truly appreciate their help and continued work up  Greater than 50% of the 50 minute visit was spent in counseling/ coordination of care regarding MRCP results, discussing the case with Dr. Ebbie and Dr. Laurence, and discussing the case and plan with the family and patient/ showing them the images.    LOS: 2 days    Manuelita JAYSON Pander 09/27/2024

## 2024-09-27 NOTE — Progress Notes (Signed)
 PROGRESS NOTE    Kyle Matthews  FMW:[id number] DOB: 05-Nov-1946 DOA: 09/25/2024 PCP: Espinoza, Alejandra, DO  Subjective: Pt seen and examined. Going for MRCP today. Remains on IV heparin . Much less abd pain compared to yesterday.   Hospital Course: CC: abd pain, N/V HPI: Kyle Matthews is a 78 y.o. male with medical history significant for hypertension, hyperlipidemia, CAD status post CABG, bioprosthetic AVR, TVR, atrial fibrillation and flutter on Eliquis , history of CVA, COPD, and BPH status post prostatectomy who presents with acute onset upper abdominal pain, nausea, and vomiting.   Patient was in his usual state until this evening when he developed acute onset of back pain and initially which was followed closely by nausea with nonbloody vomiting and then pain in the upper abdomen.  He had not eaten anything for several hours prior to this and is unable to identify any alleviating or exacerbating factors.  Symptoms are similar to what he presented with in September 2025.  He is followed by surgery for symptomatic cholelithiasis but will require cardiac clearance prior to surgery.  Additionally, cardiology has advised that he does not come off of anticoagulation for at least a month following his recent cardioversion.   ED Course: Upon arrival to the ED, patient is found to be afebrile and saturating well on room air with normal RR, normal HR, and stable BP.  Labs are most notable for normal LFTs, normal lipase, and normal WBC.  CT demonstrates cholelithiasis with stable prominence of the CBD and increased inflammatory changes in the right upper quadrant.   Patient was started on Rocephin  and Flagyl in the ED.  Significant Events: Admitted 09/25/2024 for symptomatic cholelithiasis   Admission Labs: WBC 9.2, HgB 11.5, plt 184 Na 137, K 4.0, CO2 of 24, BUN 24, Scr 1.19, glu 100 T. Prot 7.1, alb 3.9, AST 22, ALT 12, alk phos 95, t. Bili 0.3 Lipase 24 UA 30 protein, otherwise  negative  Admission Imaging Studies: CT abd/pelvis Cholelithiasis. Prominence of the common bile duct is again identified and stable. Inflammatory changes in the right upper quadrant near the common bile duct and second portion of the duodenum. These have increased in the interval from the prior exam and again may represent acute cholecystitis although the possibility of duodenitis deserves consideration as well. Diverticulosis without diverticulitis. Bladder wall thickening stable from the prior exam which may represent cystitis.  Significant Labs:   Significant Imaging Studies:   Antibiotic Therapy: Anti-infectives (From admission, onward)    Start     Dose/Rate Route Frequency Ordered Stop   09/26/24 0600  piperacillin -tazobactam (ZOSYN ) IVPB 3.375 g        3.375 g 12.5 mL/hr over 240 Minutes Intravenous Every 8 hours 09/26/24 0207     09/26/24 0000  metroNIDAZOLE (FLAGYL) tablet 500 mg        500 mg Oral  Once 09/25/24 2348 09/26/24 0058   09/25/24 2345  cefTRIAXone  (ROCEPHIN ) 2 g in sodium chloride  0.9 % 100 mL IVPB        2 g 200 mL/hr over 30 Minutes Intravenous  Once 09/25/24 2341 09/26/24 0054       Procedures:   Consultants: General surgery    Assessment and Plan: * Symptomatic cholelithiasis 09/26/24 General Surgery consulted.  Continue with IV antibiotics.  Follow-up on right upper quadrant ultrasound.  **update. RUQ U/S shows Cholelithiasis with mild gallbladder wall thickening. 2. Possible distal common bile duct dilatation; consider MRCP for further evaluation if clinically warranted. 3. 3.1 x 2.6 x  2.9 cm right hepatic lobe cystic lesion, which may represent adjacent simple cysts versus a septated cystic lesion such as cystadenoma  Will defer to general surgery to manage his cholelithiasis.  09/27/24 for MRCP today. Surgery to decide on next steps in management. Remains on IV ABX for now.   Atrial fibrillation and flutter (HCC) 09/26/24 was recently  cardioverted on September 13, 2024.  Was on Eliquis  post cardioversion.  Now on IV heparin .  09/27/24 remains on IV heparin .   Chronic heart failure with preserved ejection fraction (HFpEF) (HCC) 09/26/24 Not currently exacerbated.  Appears euvolemic.  09/27/24 stable.   Coronary artery disease 09/26/24 stable.  On Crestor .  09/27/24 stable   Essential hypertension 09/26/24 BP stable for now.  Continue Norvasc .  09/27/24 stable.   DVT prophylaxis:   IV Heparin    Code Status: Full Code Family Communication: no family at bedside. Pt is decisional Disposition Plan: return home Reason for continuing need for hospitalization: remains on IV heparin .  Objective: Vitals:   09/27/24 1105 09/27/24 1110 09/27/24 1115 09/27/24 1130  BP:      Pulse: 89 95 95 89  Resp: 20 18 20 18   Temp:      TempSrc:      SpO2: 97% 96% 99% 99%  Weight:      Height:        Intake/Output Summary (Last 24 hours) at 09/27/2024 1159 Last data filed at 09/27/2024 1100 Gross per 24 hour  Intake 293.55 ml  Output --  Net 293.55 ml   Filed Weights   09/25/24 2034  Weight: 70.3 kg    Examination:  Physical Exam Vitals and nursing note reviewed.  Constitutional:      General: He is not in acute distress.    Appearance: He is normal weight. He is not toxic-appearing.  HENT:     Head: Normocephalic and atraumatic.  Cardiovascular:     Rate and Rhythm: Normal rate and regular rhythm.  Pulmonary:     Effort: Pulmonary effort is normal. No respiratory distress.     Breath sounds: Normal breath sounds.  Abdominal:     General: Abdomen is flat. Bowel sounds are normal. There is no distension.     Palpations: Abdomen is soft.  Musculoskeletal:     Right lower leg: No edema.     Left lower leg: No edema.  Skin:    General: Skin is warm and dry.     Capillary Refill: Capillary refill takes less than 2 seconds.  Neurological:     Mental Status: He is alert and oriented to person, place,  and time.     Data Reviewed: I have personally reviewed following labs and imaging studies  CBC: Recent Labs  Lab 09/25/24 2056 09/26/24 0430 09/27/24 0431  WBC 9.2 9.2 8.8  HGB 11.5* 10.9* 11.1*  HCT 37.0* 35.1* 35.8*  MCV 84.7 84.4 85.0  PLT 184 161 159   Basic Metabolic Panel: Recent Labs  Lab 09/25/24 2056 09/26/24 0430 09/27/24 0431  NA 137 137 135  K 4.0 3.8 3.5  CL 103 104 102  CO2 24 24 23   GLUCOSE 100* 103* 87  BUN 24* 15 14  CREATININE 1.19 0.82 0.89  CALCIUM  9.1 9.0 8.9  MG  --  2.2  --    GFR: Estimated Creatinine Clearance: 62.2 mL/min (by C-G formula based on SCr of 0.89 mg/dL). Liver Function Tests: Recent Labs  Lab 09/25/24 2056 09/26/24 0430 09/27/24 0431  AST 22 20 17  ALT 12 12 10   ALKPHOS 95 92 86  BILITOT 0.3 0.3 0.4  PROT 7.1 6.7 6.8  ALBUMIN  3.9 3.6 3.8   Recent Labs  Lab 09/25/24 2056  LIPASE 24   BNP (last 3 results) Recent Labs    10/03/23 0412 10/04/23 0509 06/12/24 1819  BNP 547.0* 501.3* 169.0*   CBG: Recent Labs  Lab 09/25/24 2100  GLUCAP 179*   Radiology Studies: US  Abdomen Limited RUQ (LIVER/GB) Result Date: 09/26/2024 EXAM: Right Upper Quadrant Abdominal Ultrasound TECHNIQUE: Real-time ultrasonography of the right upper quadrant of the abdomen was performed. COMPARISON: None. CLINICAL HISTORY: Symptomatic cholelithiasis. FINDINGS: LIVER: The liver demonstrates normal echogenicity. No intrahepatic biliary ductal dilatation. No mass. A 3.1 x 2.6 x 2.9 cm lesion is present in the right hepatic lobe, demonstrating fluid echogenicity, which may represent 2 adjacent cysts or a cystic lesion with a small single internal septation, such as cystadenoma. BILIARY SYSTEM: Numerous layering gallstones are filling much of the gallbladder. Mild gallbladder wall thickening is present, measuring 0.3 cm. The common bile duct measures 0.4 cm proximally and may be dilated distally, although this appearance of dilation distally may be  due to the confluence of a low-attaching cystic duct with the common hepatic duct; this could be better characterized at MRI if clinically warranted. OTHER: No right upper quadrant ascites. IMPRESSION: 1. Cholelithiasis with mild gallbladder wall thickening. 2. Possible distal common bile duct dilatation; consider MRCP for further evaluation if clinically warranted. 3. 3.1 x 2.6 x 2.9 cm right hepatic lobe cystic lesion, which may represent adjacent simple cysts versus a septated cystic lesion such as cystadenoma. Electronically signed by: Ryan Salvage MD 09/26/2024 10:18 AM EDT RP Workstation: [id number]   CT ABDOMEN PELVIS W CONTRAST Result Date: 09/25/2024 CLINICAL DATA:  Acute onset upper abdominal pain, initial encounter EXAM: CT ABDOMEN AND PELVIS WITH CONTRAST TECHNIQUE: Multidetector CT imaging of the abdomen and pelvis was performed using the standard protocol following bolus administration of intravenous contrast. RADIATION DOSE REDUCTION: This exam was performed according to the departmental dose-optimization program which includes automated exposure control, adjustment of the mA and/or kV according to patient size and/or use of iterative reconstruction technique. CONTRAST:  OMNIPAQUE  IOHEXOL  300 MG/ML  SOLN COMPARISON:  08/21/2024 ultrasound, CT from 08/20/2024 FINDINGS: Lower chest: No acute abnormality. Hepatobiliary: Scattered cysts are noted within the liver. The gallbladder is well distended with multiple small gallstones. Persistent inflammatory changes noted surrounding the common bile duct and central gallbladder as well as the second portion of the duodenum. This is increased when compared with the prior exam. Pancreas: Pancreas is within normal limits. Spleen: Normal in size without focal abnormality. Calcified granulomas are again seen. Adrenals/Urinary Tract: Adrenal glands are within normal limits. Partially calcified cysts are noted within the kidneys bilaterally stable from  the prior exam. No follow-up is recommended. No renal calculi are seen. No obstructive changes are noted. The bladder is partially distended. Some wall thickening of the bladder is noted stable from the prior exam. Stomach/Bowel: Scattered diverticular change of the colon is noted without evidence of diverticulitis. The appendix is within normal limits. Stomach is unremarkable. Inflammatory changes surrounding the second portion of the duodenum are seen. Vascular/Lymphatic: Aortic atherosclerosis. No enlarged abdominal or pelvic lymph nodes. Reproductive: Prostate is unremarkable. Other: No abdominal wall hernia or abnormality. No abdominopelvic ascites. Musculoskeletal: No acute or significant osseous findings. IMPRESSION: Cholelithiasis. Prominence of the common bile duct is again identified and stable. Inflammatory changes in the right upper quadrant near  the common bile duct and second portion of the duodenum. These have increased in the interval from the prior exam and again may represent acute cholecystitis although the possibility of duodenitis deserves consideration as well. Diverticulosis without diverticulitis. Bladder wall thickening stable from the prior exam which may represent cystitis. Electronically Signed   By: Oneil Devonshire M.D.   On: 09/25/2024 23:30    Scheduled Meds:  amiodarone   200 mg Oral BID   amLODipine   5 mg Oral Daily   finasteride   5 mg Oral QPM   pantoprazole   40 mg Oral BID   rosuvastatin   40 mg Oral Daily   sodium chloride  flush  3 mL Intravenous Q12H   Continuous Infusions:  heparin  1,000 Units/hr (09/27/24 0942)   piperacillin -tazobactam (ZOSYN )  IV 3.375 g (09/27/24 0525)     LOS: 2 days   Time spent: 55 minutes  Camellia Door, DO  [facility]  09/27/2024, 11:59 AM

## 2024-09-27 NOTE — Care Management Important Message (Signed)
 Important Message  Patient Details  Name: Kyle Matthews MRN: [id number] Date of Birth: March 11, 1946   Important Message Given:  Yes - Medicare IM     Timon Geissinger L Celia Gibbons 09/27/2024, 4:16 PM

## 2024-09-27 NOTE — Plan of Care (Signed)
  Problem: Bowel/Gastric: Goal: Gastrointestinal status for postoperative course will improve Outcome: Progressing   Problem: Pain Management: Goal: General experience of comfort will improve Outcome: Progressing   Problem: Skin Integrity: Goal: Demonstration of wound healing without infection will improve Outcome: Progressing   Problem: Education: Goal: Knowledge of General Education information will improve Description: Including pain rating scale, medication(s)/side effects and non-pharmacologic comfort measures Outcome: Progressing   Problem: Clinical Measurements: Goal: Ability to maintain clinical measurements within normal limits will improve Outcome: Progressing Goal: Will remain free from infection Outcome: Progressing Goal: Diagnostic test results will improve Outcome: Progressing Goal: Cardiovascular complication will be avoided Outcome: Progressing   Problem: Nutrition: Goal: Adequate nutrition will be maintained Outcome: Progressing   Problem: Coping: Goal: Level of anxiety will decrease Outcome: Progressing   Problem: Pain Managment: Goal: General experience of comfort will improve and/or be controlled Outcome: Progressing   Problem: Safety: Goal: Ability to remain free from injury will improve Outcome: Progressing   Problem: Skin Integrity: Goal: Risk for impaired skin integrity will decrease Outcome: Progressing

## 2024-09-27 NOTE — Progress Notes (Signed)
   MRCP results discussed with Dr. Kallie. She then called Dr. Ebbie with [facility]. Pt recently seen in office on 09-20-2024 by Dr. Vernetta due to cholelithiasis.  Dr. Kallie does not think pt has a perforation of his gallbladder. Dr. Ebbie wants pt to be transferred to [facility] to be examined and consideration for cholecystostomy tube. Will continue with IV heparain and arrange for med/surg bed at [facility].  Camellia Door, DO [facility]

## 2024-09-27 NOTE — Progress Notes (Signed)
 Mobility Specialist Progress Note:    09/27/24 0900  Mobility  Activity Ambulated with assistance  Level of Assistance Independent  Assistive Device None  Distance Ambulated (ft) 280 ft  Range of Motion/Exercises Active;All extremities  Activity Response Tolerated well  Mobility Referral Yes  Mobility visit 1 Mobility  Mobility Specialist Start Time (ACUTE ONLY) 0900  Mobility Specialist Stop Time (ACUTE ONLY) 0920  Mobility Specialist Time Calculation (min) (ACUTE ONLY) 20 min   Pt received in bed, agreeable to mobility. Independently able to stand and ambulate with no AD. Tolerated well, c/o abdominal pain on right side. Returned supine, all needs met.   Dessirae Scarola Mobility Specialist Please contact via Special educational needs teacher or  Rehab office at [phone]

## 2024-09-27 NOTE — Progress Notes (Signed)
 PHARMACY - ANTICOAGULATION CONSULT NOTE  Pharmacy Consult for eliquis  >> heparin  Indication: atrial fibrillation  Allergies  Allergen Reactions   Tetanus Toxoid-Containing Vaccines Other (See Comments)    Childhood Allergy    Zetia  [Ezetimibe ] Diarrhea    Patient Measurements: Height: 5' 3 (160 cm) Weight: 70.3 kg (155 lb) IBW/kg (Calculated) : 56.9 HEPARIN  DW (KG): 70.3  Vital Signs: Temp: 97.8 F (36.6 C) (10/10 0349) Temp Source: Oral (10/10 0349) BP: 124/82 (10/10 0349) Pulse Rate: 96 (10/10 0349)  Labs: Recent Labs    09/25/24 2056 09/26/24 0430 09/26/24 1011 09/26/24 1957 09/27/24 0431  HGB 11.5* 10.9*  --   --  11.1*  HCT 37.0* 35.1*  --   --  35.8*  PLT 184 161  --   --  159  APTT  --   --  38* 110* 149*  HEPARINUNFRC  --   --  0.69  --  1.01*  CREATININE 1.19 0.82  --   --  0.89    Estimated Creatinine Clearance: 62.2 mL/min (by C-G formula based on SCr of 0.89 mg/dL).   Medical History: Past Medical History:  Diagnosis Date   Anemia    Anxiety    Arthritis    BPH (benign prostatic hyperplasia)    Status post biopsy in 2009. - w/ LUTS   Coronary artery disease    a. 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 Pharmacy consulted to dose heparin  for atrial fibrillation pending surgery. PMH  includes afib s/p cardioversion on  9/26 and on eliquis .  -Last dose of eliquis : 09/25/2024 @1000  CBC WNL aPTT 38> 110> 149, supratherapeutic HL 0.69> 1.01 - supratherapeutic and still affected by previous Eliquis   Surgery consult to f/u MRI and continue heparin  for now  Goal of Therapy:  Heparin  level 0.3-0.7 units/ml Monitor platelets by anticoagulation protocol: Yes   Plan:  Hold heparin  x 1 hour , then decrease heparin  infusion to 1000 units/hr Check aPTT in 8 hours and anti-Xa level and aPTT daily while on heparin  Monitor with aPTTs until correlates with heparin  level Continue to monitor H&H and platelets  Cherlyn Boers, BS Pharm D, BCPS Clinical Pharmacist 09/27/2024 7:55 AM

## 2024-09-27 NOTE — Plan of Care (Signed)
   Problem: Education: Goal: Knowledge of the procedure and recovery process will improve Outcome: Progressing   Problem: Bowel/Gastric: Goal: Gastrointestinal status for postoperative course will improve Outcome: Progressing   Problem: Pain Management: Goal: General experience of comfort will improve Outcome: Progressing   Problem: Skin Integrity: Goal: Demonstration of wound healing without infection will improve Outcome: Progressing   Problem: Urinary Elimination: Goal: Ability to avoid or minimize complications of infection will improve Outcome: Progressing Goal: Ability to achieve and maintain urine output will improve Outcome: Progressing Goal: Home care management will improve Outcome: Progressing   Problem: Education: Goal: Knowledge of General Education information will improve Description: Including pain rating scale, medication(s)/side effects and non-pharmacologic comfort measures Outcome: Progressing   Problem: Health Behavior/Discharge Planning: Goal: Ability to manage health-related needs will improve Outcome: Progressing   Problem: Clinical Measurements: Goal: Ability to maintain clinical measurements within normal limits will improve Outcome: Progressing Goal: Will remain free from infection Outcome: Progressing Goal: Diagnostic test results will improve Outcome: Progressing Goal: Respiratory complications will improve Outcome: Progressing Goal: Cardiovascular complication will be avoided Outcome: Progressing   Problem: Activity: Goal: Risk for activity intolerance will decrease Outcome: Progressing   Problem: Nutrition: Goal: Adequate nutrition will be maintained Outcome: Progressing   Problem: Coping: Goal: Level of anxiety will decrease Outcome: Progressing   Problem: Elimination: Goal: Will not experience complications related to bowel motility Outcome: Progressing Goal: Will not experience complications related to urinary  retention Outcome: Progressing   Problem: Pain Managment: Goal: General experience of comfort will improve and/or be controlled Outcome: Progressing   Problem: Safety: Goal: Ability to remain free from injury will improve Outcome: Progressing   Problem: Skin Integrity: Goal: Risk for impaired skin integrity will decrease Outcome: Progressing

## 2024-09-28 DIAGNOSIS — K8020 Calculus of gallbladder without cholecystitis without obstruction: Secondary | ICD-10-CM | POA: Diagnosis not present

## 2024-09-28 LAB — COMPREHENSIVE METABOLIC PANEL WITH GFR
ALT: 11 U/L (ref 0–44)
AST: 14 U/L — ABNORMAL LOW (ref 15–41)
Albumin: 2.8 g/dL — ABNORMAL LOW (ref 3.5–5.0)
Alkaline Phosphatase: 70 U/L (ref 38–126)
Anion gap: 13 (ref 5–15)
BUN: 12 mg/dL (ref 8–23)
CO2: 21 mmol/L — ABNORMAL LOW (ref 22–32)
Calcium: 8.2 mg/dL — ABNORMAL LOW (ref 8.9–10.3)
Chloride: 98 mmol/L (ref 98–111)
Creatinine, Ser: 0.82 mg/dL (ref 0.61–1.24)
GFR, Estimated: >60 mL/min (ref >60)
Glucose, Bld: 89 mg/dL (ref 70–99)
Potassium: 3.2 mmol/L — ABNORMAL LOW (ref 3.5–5.1)
Sodium: 132 mmol/L — ABNORMAL LOW (ref 135–145)
Total Bilirubin: 0.8 mg/dL (ref 0.0–1.2)
Total Protein: 6.4 g/dL — ABNORMAL LOW (ref 6.5–8.1)

## 2024-09-28 LAB — CBC
HCT: 34.2 % — ABNORMAL LOW (ref 39.0–52.0)
Hemoglobin: 10.8 g/dL — ABNORMAL LOW (ref 13.0–17.0)
MCH: 26.3 pg (ref 26.0–34.0)
MCHC: 31.6 g/dL (ref 30.0–36.0)
MCV: 83.2 fL (ref 80.0–100.0)
Platelets: 150 K/uL (ref 150–400)
RBC: 4.11 MIL/uL — ABNORMAL LOW (ref 4.22–5.81)
RDW: 15.7 % — ABNORMAL HIGH (ref 11.5–15.5)
WBC: 8.6 K/uL (ref 4.0–10.5)
nRBC: 0 % (ref 0.0–0.2)

## 2024-09-28 LAB — APTT
aPTT: 82 s — ABNORMAL HIGH (ref 24–36)
aPTT: 45 s — ABNORMAL HIGH (ref 24–36)
aPTT: 90 s — ABNORMAL HIGH (ref 24–36)

## 2024-09-28 LAB — HEPARIN LEVEL (UNFRACTIONATED): Heparin Unfractionated: >1.1 [IU]/mL — ABNORMAL HIGH (ref 0.30–0.70)

## 2024-09-28 NOTE — Consult Note (Signed)
 Reason for Consult:ccy abd possible gb perforation Referring Physician: Dr. Daria Bitter  [name] is an 78 y.o. male.  HPI: Kyle Matthews is a 78 y.o. male with significant cardiac history s/p CABG and aortic valve surgery 10/2023 and 1 vessel CABG, Bentall procedure, pacemaker 06/2024. He had a cardioversion 09/13/24 and is suppose to remain on his anticoagulation for at least 1 month.  He has come into the hospital again with RUQ pain and nausea/vomiting. He was seen at [facility] 1 month ago with similar symptoms. His imaging was consistent with cholelithiasis but no signs of cholecystitis at that time. I saw him and recommended that he get cardiology to evaluate him given his cardiac history and risk. He was able to actually eat and wanted to go home. The hospitalist sent him out that day.  He had follow up with his cardiologist already scheduled. Per the cardiology teams notes after his 09/13/24 cardioversion Per Dr.Thukkani 09/20/2024 - Kyle Matthews had a cardioversion recently so he cannot come off Eliquis  for at least a month after that. If this procedure is elective then I would hold off for a few months.      Pt has been seen by Dr. Vernetta.  Pt underwent MRCP with concern for gb perforation.  Pt with high risk comorbidities.  Past Medical History:  Diagnosis Date   Anemia    Anxiety    Arthritis    BPH (benign prostatic hyperplasia)    Status post biopsy in 2009. - w/ LUTS   Coronary artery disease    a. 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    Severe aortic stenosis 04/13/2017   Stroke (HCC) 2007   No true etiology noted   UTI (urinary tract infection)     Past Surgical History:  Procedure Laterality Date   AORTIC VALVE REPLACEMENT N/A 11/02/2023   Procedure: AORTIC VALVE REPLACEMENT (AVR) USING 23 MM INSPIRIS RESILIA AORTIC VALVE;  Surgeon: Lucas Dorise POUR, MD;  Location[facility];  Service: Open Heart Surgery;  Laterality: N/A;   BENTALL PROCEDURE N/A 06/26/2024   Procedure: BENTALL PROCEDURE USING KONECT AORTIC VALVE CONDUIT SIZE ;  Surgeon: Lucas Dorise POUR, MD;  Location: [facility] OR;  Service: Open Heart Surgery;  Laterality: N/A;   CARDIOVERSION N/A 09/13/2024   Procedure: CARDIOVERSION;  Surgeon: Kate Lonni CROME, MD;  Location: [facility] INVASIVE CV LAB;  Service: Cardiovascular;  Laterality: N/A;   CORONARY ARTERY BYPASS GRAFT N/A 11/02/2023   Procedure: CORONARY ARTERY BYPASS GRAFTING (CABG) X TWO USING LEFT INTERNAL MAMMARY ARTERY AND RIGHT GREATER SAPHENEOUS VEIN HARVESTED ENDOSCOPICLY;  Surgeon: Lucas Dorise POUR, MD;  Location: [facility] OR;  Service: Open Heart Surgery;  Laterality: N/A;   CORONARY ARTERY BYPASS GRAFT N/A 06/26/2024   Procedure: CORONARY ARTERY BYPASS GRAFTING (CABG) TIMES ONE USING ENDOSCOPICALLY HARVESTED LEFT GREATER SAPHENOUS VEIN;  Surgeon: Lucas Dorise POUR, MD;  Location: [facility];  Service: Open Heart Surgery;  Laterality: N/A;   CORONARY PRESSURE/FFR STUDY N/A 10/09/2023   Procedure: CORONARY PRESSURE/FFR STUDY;  Surgeon: Wendel Haws  K, MD;  [facility] INVAS[facility] CV LAB;  Service: Cardiovascular;  Laterality: N/A;   IR ANGIOGRAM PELVIS SELECTIVE OR SUPRASELECTIVE  10/27/2023   IR ANGIOGRAM PELVIS SELECTIVE OR SUPRASELECTIVE  10/27/2023   IR ANGIOGRAM SELECTIVE EACH ADDITIONAL VESSEL  10/27/2023   IR ANGIOGRAM SELECTIVE EACH ADDITIONAL VESSEL  10/27/2023   IR ANGIOGRAM SELECTIVE EACH ADDITIONAL VESSEL  10/27/2023   IR ANGIOGRAM SELECTIVE EACH ADDITIONAL VESSEL  10/27/2023   IR EMBO ARTERIAL NOT HEMORR HEMANG INC  GUIDE ROADMAPPING  10/27/2023   IR US  GUIDE VASC ACCESS RIGHT  10/27/2023   PACEMAKER IMPLANT N/A 07/01/2024   Procedure: PACEMAKER IMPLANT;  Surgeon: Inocencio Soyla Lunger, MD;  [facility]VAS[facility] LAB;  Service: Cardiovascular;  Laterality: N/A;   REDO STERNOTOMY N/A 06/26/2024   Procedure: REDO STERNOTOMY;  Surgeon: Lucas Dorise POUR, MD;  [facility];  Service: Open Heart Surgery;  Laterality: N/A;   RIGHT HEART CATH AND CORONARY ANGIOGRAPHY N/A 06/20/2024   Procedure: RIGHT HEART CATH AND CORONARY ANGIOGRAPHY;  Surgeon: Ladona Heinz, MD;  [facility] LAB;  Service: Cardiovascular;  Laterality: N/A;   RIGHT/LEFT HEART CATH AND CORONARY ANGIOGRAPHY N/A 10/09/2023   Procedure: RIGHT/LEFT HEART CATH AND CORONARY ANGIOGRAPHY;  Surgeon: Wendel Lurena POUR, MD;  [facility];  Service: Cardiovascular;  Laterality: N/A;   TEE WITHOUT CARDIOVERSION N/A 11/02/2023   Procedure: TRANSESOPHAGEAL ECHOCARDIOGRAM;  Surgeon: Lucas Dorise POUR, MD;  Location[facility] OR;  Service: Open Heart Surgery;  Laterality: N/A;   TEE WITHOUT CARDIOVERSION N/A 06/26/2024   Procedure: ECHOCARDIOGRAM, TRANSESOPHAGEAL;  Surgeon: Lucas Dorise POUR, MD;  [facility];  Service: Open Heart Surgery;  Laterality: N/A;   TRANSESOPHAGEAL ECHOCARDIOGRAM (CATH LAB) N/A 06/18/2024   Procedure: TRANSESOPHAGEAL ECHOCARDIOGRAM;  Surgeon: Jeffrie Oneil BROCKS, MD;  [facility] INVAS[facility] CV LAB;  Service: Cardiovascular;  Laterality: N/A;   TRANSTHORACIC ECHOCARDIOGRAM  04/28/2017    EF 65-70%. GR 1 DD. No regional wall motion abnormality. Moderate aortic stenosis with moderate regurgitation. Mean gradient 34 mmHg.   TRICUSPID VALVE REPLACEMENT N/A 06/26/2024   Procedure: TRICUSPID VALVE REPLACEMENT USING MOSIAC BRIOPROSTHESIS VALVE SIZE ;  Surgeon: Lucas Dorise POUR, MD;  Location: [facility] OR;  Service: Open Heart Surgery;  Laterality: N/A;   XI ROBOTIC ASSISTED SIMPLE PROSTATECTOMY N/A 05/22/2024   Procedure: PROSTATECTOMY, SIMPLE,  ROBOT-ASSISTED;  Surgeon: Alvaro Ricardo KATHEE Mickey., MD;  Location: [facility] ORS;  Service: Urology;  Laterality: N/A;    Family History  Problem Relation Age of Onset   Coronary artery disease Mother 70       87. Was diagnosed with a blood clot - suspect that this was a STEMI   Lung cancer Father    Thyroid  disease Sister    Healthy Brother 71   Pancreatic cancer Sister 46   Cancer Maternal Grandmother    Heart disease Maternal Grandfather 37   Cancer Paternal Grandmother    Stroke Paternal Grandfather     Social History:  reports that he has been smoking cigarettes. He has a 36 pack-year smoking history. He has been exposed to tobacco smoke. He has quit using smokeless tobacco. He reports that he does not currently use alcohol after a past usage of about 6.0 standard drinks of alcohol per week. He reports that he does not use drugs.  Allergies:  Allergies  Allergen Reactions   Tetanus Toxoid-Containing Vaccines Other (See Comments)    Childhood Allergy    Zetia  [Ezetimibe ] Diarrhea    Medications: I have  reviewed the patient's current medications.  Results for orders placed or performed during the hospital encounter of 09/25/24 (from the past 48 hours)  Heparin  level (unfractionated)     Status: None   Collection Time: 09/26/24 10:11 AM  Result Value Ref Range   Heparin  Unfractionated 0.69 0.30 - 0.70 IU/mL    Comment: (NOTE) The clinical reportable range upper limit is being lowered to >1.10 to align with the FDA approved guidance for the current laboratory assay.  If heparin  results are below expected values, and patient dosage has  been confirmed, suggest follow up testing of antithrombin III levels. Performed at [facility], [address], [place], [place] [address]   APTT     Status: Abnormal   Collection Time: 09/26/24 10:11 AM  Result Value Ref Range   aPTT 38 (H) 24 - 36 seconds    Comment:        IF BASELINE aPTT IS ELEVATED, SUGGEST PATIENT RISK ASSESSMENT BE USED  TO DETERMINE APPROPRIATE ANTICOAGULANT THERAPY. Performed at [facility], [address], [place], [place] [address]   APTT     Status: Abnormal   Collection Time: 09/26/24  7:57 PM  Result Value Ref Range   aPTT 110 (H) 24 - 36 seconds    Comment:        IF BASELINE aPTT IS ELEVATED, SUGGEST PATIENT RISK ASSESSMENT BE USED TO DETERMINE APPROPRIATE ANTICOAGULANT THERAPY. REPEATED TO VERIFY Performed at [facility], [address] [place], [place] [address]   Comprehensive metabolic panel     Status: None   Collection Time: 09/27/24  4:31 AM  Result Value Ref Range   Sodium 135 135 - 145 mmol/L   Potassium 3.5 3.5 - 5.1 mmol/L   Chloride 102 98 - 111 mmol/L   CO2 23 22 - 32 mmol/L   Glucose, Bld 87 70 - 99 mg/dL    Comment: Glucose reference range applies only to samples taken after fasting for at least 8 hours.   BUN 14 8 - 23 mg/dL   Creatinine, Ser 9.10 0.61 - 1.24 mg/dL   Calcium  8.9 8.9 - 10.3 mg/dL   Total Protein 6.8 6.5 - 8.1 g/dL   Albumin  3.8 3.5 - 5.0 g/dL   AST 17 15 - 41 U/L   ALT 10 0 - 44 U/L   Alkaline Phosphatase 86 38 - 126 U/L   Total Bilirubin 0.4 0.0 - 1.2 mg/dL   GFR, Estimated >39 >39 mL/min    Comment: (NOTE) Calculated using the CKD-EPI Creatinine Equation (2021)    Anion gap 10 5 - 15    Comment: Performed at [facility], [address] [place], [place] [address]  CBC     Status: Abnormal   Collection Time: 09/27/24  4:31 AM  Result Value Ref Range   WBC 8.8 4.0 - 10.5 K/uL   RBC 4.21 (L) 4.22 - 5.81 MIL/uL   Hemoglobin 11.1 (L) 13.0 - 17.0 g/dL   HCT 64.1 (L) 60.9 - 47.9 %   MCV 85.0 80.0 - 100.0 fL   MCH 26.4 26.0 - 34.0 pg   MCHC 31.0 30.0 - 36.0 g/dL   RDW 83.9 (H) 88.4 - 84.4 %   Platelets 159 150 - 400 K/uL   nRBC 0.0 0.0 - 0.2 %    Comment: Performed at [facility], [address] [place], [place] [address]  Heparin  level (unfractionated)     Status: Abnormal   Collection Time: 09/27/24  4:31 AM  Result  Value Ref Range    Heparin  Unfractionated 1.01 (H) 0.30 - 0.70 IU/mL    Comment: (NOTE) The clinical reportable range upper limit is being lowered to >1.10 to align with the FDA approved guidance for the current laboratory assay.  If heparin  results are below expected values, and patient dosage has  been confirmed, suggest follow up testing of antithrombin III levels. Performed at [facility], [address], [place], [place] [address]   APTT     Status: Abnormal   Collection Time: 09/27/24  4:31 AM  Result Value Ref Range   aPTT 149 (H) 24 - 36 seconds    Comment:        IF BASELINE aPTT IS ELEVATED, SUGGEST PATIENT RISK ASSESSMENT BE USED TO DETERMINE APPROPRIATE ANTICOAGULANT THERAPY. Performed at [facility], [address] [place], [place] [address]   APTT     Status: Abnormal   Collection Time: 09/27/24  3:14 PM  Result Value Ref Range   aPTT 94 (H) 24 - 36 seconds    Comment:        IF BASELINE aPTT IS ELEVATED, SUGGEST PATIENT RISK ASSESSMENT BE USED TO DETERMINE APPROPRIATE ANTICOAGULANT THERAPY. Performed at [facility], [address], [place], [place] [address]   Heparin  level (unfractionated)     Status: Abnormal   Collection Time: 09/28/24  8:03 AM  Result Value Ref Range   Heparin  Unfractionated >1.10 (H) 0.30 - 0.70 IU/mL    Comment: (NOTE) The clinical reportable range upper limit is being lowered to >1.10 to align with the FDA approved guidance for the current laboratory assay.  If heparin  results are below expected values, and patient dosage has  been confirmed, suggest follow up testing of antithrombin III levels. Performed at [facility], [address] [place], [place] [address]     MR ABDOMEN MRCP W WO CONTAST Result Date: 09/27/2024 CLINICAL DATA:  801171 Common bile duct dilatation 801171; 220388 Abnormal CT of the abdomen 220388 EXAM: MRI ABDOMEN WITHOUT AND WITH CONTRAST (INCLUDING MRCP) TECHNIQUE: Multiplanar multisequence MR imaging of the abdomen  was performed both before and after the administration of intravenous contrast. Heavily T2-weighted images of the biliary and pancreatic ducts were obtained, and three-dimensional MRCP images were rendered by post processing. CONTRAST:  7mL GADAVIST GADOBUTROL 1 MMOL/ML IV SOLN COMPARISON:  Ultrasound abdomen from 09/26/2024 and CT scan abdomen and pelvis from 09/25/2024. FINDINGS: Lower chest: Unremarkable MR appearance to the lung bases. No pleural effusion. No pericardial effusion. Normal heart size. Hepatobiliary: The liver is normal in size. Noncirrhotic configuration. There are 2 simple cysts in the liver with largest in the right hepatic lobe measuring up to 2.1 x 3.3 cm. No suspicious liver lesion. No intrahepatic bile duct dilation. The extrahepatic bile duct is slightly prominent measuring up to 8 mm in the proximal portion, 8 mm in the midportion and gradually tapers up to 6 mm just before the ampulla of Vater. The extrahepatic bile duct diameters are within normal limits for patient's age. No obstructing mass or choledocholithiasis. The gallbladder is physiologically distended and exhibit moderate-to-large amount of innumerable sub 5 mm gallstones. No abnormal gallbladder wall thickening. However, there are 3 adjacent mildly thick enhancing wall collections to the left of the gallbladder and posterior to the extrahepatic bile duct with largest collection measuring up to 1.6 x 2.7 cm (series 34, images 41-46). These collections are grossly similar to the prior study from 09/25/2024 but new since the prior study from 08/20/2024. Collections  are adjacent to/inseparable from the gallbladder neck/cystic duct. There is also fat stranding in the right upper quadrant centered around this region. There is apparent transient hepatic intensity difference in the surrounding liver parenchyma (series 21, images 41-45). The duodenal, pancreatic head/uncinate process, right adrenal and right kidney appears within normal  limits and likely not the source of inflammation. These constellation of findings raises the concern for probable small contained perforation of the gallbladder neck/cystic duct region. Correlate clinically. Pancreas: Normal T1 hyperintense appearance of the pancreas. There is a 1.1 x 1.2 cm multilobulated T2 hyperintense, nonenhancing structure in the uncinate process. There is an additional, 6 x 6 mm similar characteristic structure in the pancreas head/uncinate junction region (series 4, image 20). No direct communication of the structures noted with the pancreatic side branch or main duct. Differential diagnosis includes pancreatic side branch IPMN or pseudocyst. Attention on follow-up examination is recommended. The pancreas is otherwise unremarkable. Main pancreatic duct is not dilated. No peripancreatic fat stranding. Spleen:  Within normal limits in size and appearance. No focal mass. Adrenals/Urinary Tract: Unremarkable adrenal glands. No hydroureteronephrosis on either side. There are multiple bilateral renal cysts with largest arising from the right kidney upper pole measuring up to 5.2 x 6.2 cm. There are also multiple structures with varying amount of T1 hyperintense components with largest T1 hyperintense structure arising from the left kidney interpolar region, anteriorly measuring up to 1.4 x 2.0 cm, which can be characterized as a proteinaceous/hemorrhagic cysts. The other structures with T1 hyperintense contents and lack of enhancement also favor small quantity of dependent proteinaceous/hemorrhagic contents. No suspicious renal mass. Stomach/Bowel: Visualized portions within the abdomen are unremarkable. No disproportionate dilation of bowel loops. Vascular/Lymphatic: No pathologically enlarged lymph nodes identified. No abdominal aortic aneurysm demonstrated. There is trace amount of ascites along the right posteroinferior liver surface Other:  None. Musculoskeletal: No suspicious bone lesions  identified. IMPRESSION: 1. There are 3 adjacent mildly thick enhancing walled collections to the left of the gallbladder and posterior to the extrahepatic bile duct with largest collection measuring up to 1.6 x 2.7 cm. These collections are adjacent to/inseparable from the gallbladder neck/cystic duct. There is also fat stranding in the right upper quadrant centered around this region. There is apparent transient hepatic intensity difference in the surrounding liver parenchyma. The duodenum, pancreatic head/uncinate process, right adrenal and right kidney appears within normal limits and likely not the source of inflammation. These constellation of findings raises the concern for probable small contained perforation of the gallbladder neck/cystic duct region with resultant mild complex collection. Correlate clinically. 2. There is a 1.1 x 1.2 cm multilobulated T2 hyperintense, nonenhancing structure in the uncinate process. There is an additional, 6 x 6 mm similar characteristic structure in the pancreas head/uncinate junction region. No direct communication of the structures noted with the pancreatic side branch or main duct. Differential diagnosis includes pancreatic side branch IPMN or pseudocyst. Attention on follow-up examination is recommended. 3. Multiple bilateral renal simple renal cysts as well as cysts with proteinaceous/hemorrhagic contents. No suspicious renal mass. 4. Multiple other nonacute observations, as described above. Electronically Signed   By: Ree Molt M.D.   On: 09/27/2024 12:17   MR 3D Recon At Scanner Result Date: 09/27/2024 CLINICAL DATA:  801171 Common bile duct dilatation 801171; 220388 Abnormal CT of the abdomen 220388 EXAM: MRI ABDOMEN WITHOUT AND WITH CONTRAST (INCLUDING MRCP) TECHNIQUE: Multiplanar multisequence MR imaging of the abdomen was performed both before and after the administration of intravenous contrast. Heavily  T2-weighted images of the biliary and pancreatic  ducts were obtained, and three-dimensional MRCP images were rendered by post processing. CONTRAST:  7mL GADAVIST GADOBUTROL 1 MMOL/ML IV SOLN COMPARISON:  Ultrasound abdomen from 09/26/2024 and CT scan abdomen and pelvis from 09/25/2024. FINDINGS: Lower chest: Unremarkable MR appearance to the lung bases. No pleural effusion. No pericardial effusion. Normal heart size. Hepatobiliary: The liver is normal in size. Noncirrhotic configuration. There are 2 simple cysts in the liver with largest in the right hepatic lobe measuring up to 2.1 x 3.3 cm. No suspicious liver lesion. No intrahepatic bile duct dilation. The extrahepatic bile duct is slightly prominent measuring up to 8 mm in the proximal portion, 8 mm in the midportion and gradually tapers up to 6 mm just before the ampulla of Vater. The extrahepatic bile duct diameters are within normal limits for patient's age. No obstructing mass or choledocholithiasis. The gallbladder is physiologically distended and exhibit moderate-to-large amount of innumerable sub 5 mm gallstones. No abnormal gallbladder wall thickening. However, there are 3 adjacent mildly thick enhancing wall collections to the left of the gallbladder and posterior to the extrahepatic bile duct with largest collection measuring up to 1.6 x 2.7 cm (series 34, images 41-46). These collections are grossly similar to the prior study from 09/25/2024 but new since the prior study from 08/20/2024. Collections are adjacent to/inseparable from the gallbladder neck/cystic duct. There is also fat stranding in the right upper quadrant centered around this region. There is apparent transient hepatic intensity difference in the surrounding liver parenchyma (series 21, images 41-45). The duodenal, pancreatic head/uncinate process, right adrenal and right kidney appears within normal limits and likely not the source of inflammation. These constellation of findings raises the concern for probable small contained  perforation of the gallbladder neck/cystic duct region. Correlate clinically. Pancreas: Normal T1 hyperintense appearance of the pancreas. There is a 1.1 x 1.2 cm multilobulated T2 hyperintense, nonenhancing structure in the uncinate process. There is an additional, 6 x 6 mm similar characteristic structure in the pancreas head/uncinate junction region (series 4, image 20). No direct communication of the structures noted with the pancreatic side branch or main duct. Differential diagnosis includes pancreatic side branch IPMN or pseudocyst. Attention on follow-up examination is recommended. The pancreas is otherwise unremarkable. Main pancreatic duct is not dilated. No peripancreatic fat stranding. Spleen:  Within normal limits in size and appearance. No focal mass. Adrenals/Urinary Tract: Unremarkable adrenal glands. No hydroureteronephrosis on either side. There are multiple bilateral renal cysts with largest arising from the right kidney upper pole measuring up to 5.2 x 6.2 cm. There are also multiple structures with varying amount of T1 hyperintense components with largest T1 hyperintense structure arising from the left kidney interpolar region, anteriorly measuring up to 1.4 x 2.0 cm, which can be characterized as a proteinaceous/hemorrhagic cysts. The other structures with T1 hyperintense contents and lack of enhancement also favor small quantity of dependent proteinaceous/hemorrhagic contents. No suspicious renal mass. Stomach/Bowel: Visualized portions within the abdomen are unremarkable. No disproportionate dilation of bowel loops. Vascular/Lymphatic: No pathologically enlarged lymph nodes identified. No abdominal aortic aneurysm demonstrated. There is trace amount of ascites along the right posteroinferior liver surface Other:  None. Musculoskeletal: No suspicious bone lesions identified. IMPRESSION: 1. There are 3 adjacent mildly thick enhancing walled collections to the left of the gallbladder and  posterior to the extrahepatic bile duct with largest collection measuring up to 1.6 x 2.7 cm. These collections are adjacent to/inseparable from the gallbladder neck/cystic duct. There  is also fat stranding in the right upper quadrant centered around this region. There is apparent transient hepatic intensity difference in the surrounding liver parenchyma. The duodenum, pancreatic head/uncinate process, right adrenal and right kidney appears within normal limits and likely not the source of inflammation. These constellation of findings raises the concern for probable small contained perforation of the gallbladder neck/cystic duct region with resultant mild complex collection. Correlate clinically. 2. There is a 1.1 x 1.2 cm multilobulated T2 hyperintense, nonenhancing structure in the uncinate process. There is an additional, 6 x 6 mm similar characteristic structure in the pancreas head/uncinate junction region. No direct communication of the structures noted with the pancreatic side branch or main duct. Differential diagnosis includes pancreatic side branch IPMN or pseudocyst. Attention on follow-up examination is recommended. 3. Multiple bilateral renal simple renal cysts as well as cysts with proteinaceous/hemorrhagic contents. No suspicious renal mass. 4. Multiple other nonacute observations, as described above. Electronically Signed   By: Ree Molt M.D.   On: 09/27/2024 12:17   US  Abdomen Limited RUQ (LIVER/GB) Result Date: 09/26/2024 EXAM: Right Upper Quadrant Abdominal Ultrasound TECHNIQUE: Real-time ultrasonography of the right upper quadrant of the abdomen was performed. COMPARISON: None. CLINICAL HISTORY: Symptomatic cholelithiasis. FINDINGS: LIVER: The liver demonstrates normal echogenicity. No intrahepatic biliary ductal dilatation. No mass. A 3.1 x 2.6 x 2.9 cm lesion is present in the right hepatic lobe, demonstrating fluid echogenicity, which may represent 2 adjacent cysts or a cystic lesion  with a small single internal septation, such as cystadenoma. BILIARY SYSTEM: Numerous layering gallstones are filling much of the gallbladder. Mild gallbladder wall thickening is present, measuring 0.3 cm. The common bile duct measures 0.4 cm proximally and may be dilated distally, although this appearance of dilation distally may be due to the confluence of a low-attaching cystic duct with the common hepatic duct; this could be better characterized at MRI if clinically warranted. OTHER: No right upper quadrant ascites. IMPRESSION: 1. Cholelithiasis with mild gallbladder wall thickening. 2. Possible distal common bile duct dilatation; consider MRCP for further evaluation if clinically warranted. 3. 3.1 x 2.6 x 2.9 cm right hepatic lobe cystic lesion, which may represent adjacent simple cysts versus a septated cystic lesion such as cystadenoma. Electronically signed by: Ryan Salvage MD 09/26/2024 10:18 AM EDT RP Workstation: [id number]    Review of Systems  Constitutional:  Negative for chills and fever.  HENT:  Negative for ear discharge, hearing loss and sore throat.   Eyes:  Negative for discharge.  Respiratory:  Negative for cough and shortness of breath.   Cardiovascular:  Negative for chest pain and leg swelling.  Gastrointestinal:  Negative for abdominal pain, constipation, diarrhea, nausea and vomiting.  Musculoskeletal:  Negative for myalgias and neck pain.  Skin:  Negative for rash.  Allergic/Immunologic: Negative for environmental allergies.  Neurological:  Negative for dizziness and seizures.  Hematological:  Does not bruise/bleed easily.  Psychiatric/Behavioral:  Negative for suicidal ideas.   All other systems reviewed and are negative.  Blood pressure 126/66, pulse 81, temperature 98.5 F (36.9 C), temperature source Oral, resp. rate 16, height 5' 3 (1.6 m), weight 70.3 kg, SpO2 95%. Physical Exam Constitutional:      Appearance: He is well-developed.     Comments:  Conversant No acute distress  HENT:     Head: Normocephalic and atraumatic.  Eyes:     General: Lids are normal. No scleral icterus.    Pupils: Pupils are equal, round, and reactive to light.  Comments: Pupils are equal round and reactive No lid lag Moist conjunctiva  Neck:     Thyroid : No thyromegaly.     Trachea: No tracheal tenderness.     Comments: No cervical lymphadenopathy Cardiovascular:     Rate and Rhythm: Normal rate and regular rhythm.     Heart sounds: No murmur heard. Pulmonary:     Effort: Pulmonary effort is normal.     Breath sounds: Normal breath sounds. No wheezing or rales.  Abdominal:     Tenderness: There is no abdominal tenderness.     Hernia: No hernia is present.  Musculoskeletal:     Cervical back: Normal range of motion and neck supple.  Skin:    General: Skin is warm.     Findings: No rash.     Nails: There is no clubbing.     Comments: Normal skin turgor  Neurological:     Mental Status: He is alert and oriented to person, place, and time.     Comments: Normal gait and station  Psychiatric:        Mood and Affect: Mood normal.        Thought Content: Thought content normal.        Judgment: Judgment normal.     Comments: Appropriate affect     Assessment/Plan: 57M with acute cholecystitis and possible gb perforation  -con't abx -will ask IR to eval for possible cholecystostomy tube.  If not possible pt would likely just need to be treated with abx as he can't come off his eliquis  -Following   HTN CAD CHF Afib  Lynda Leos 09/28/2024, 9:22 AM

## 2024-09-28 NOTE — Progress Notes (Signed)
 Patient ID: Kyle Matthews, male   DOB: 11-28-1946, 78 y.o.   MRN: [id number] IR received consult for possible cholecystostomy placement based on MRCP findings from yesterday.  MRCP 09/27/24: IMPRESSION: 1. There are 3 adjacent mildly thick enhancing walled collections to the left of the gallbladder and posterior to the extrahepatic bile duct with largest collection measuring up to 1.6 x 2.7 cm. These collections are adjacent to/inseparable from the gallbladder neck/cystic duct. There is also fat stranding in the right upper quadrant centered around this region. There is apparent transient hepatic intensity difference in the surrounding liver parenchyma. The duodenum, pancreatic head/uncinate process, right adrenal and right kidney appears within normal limits and likely not the source of inflammation. These constellation of findings raises the concern for probable small contained perforation of the gallbladder neck/cystic duct region with resultant mild complex collection. Correlate clinically. 2. There is a 1.1 x 1.2 cm multilobulated T2 hyperintense, nonenhancing structure in the uncinate process. There is an additional, 6 x 6 mm similar characteristic structure in the pancreas head/uncinate junction region. No direct communication of the structures noted with the pancreatic side branch or main duct. Differential diagnosis includes pancreatic side branch IPMN or pseudocyst. Attention on follow-up examination is recommended. 3. Multiple bilateral renal simple renal cysts as well as cysts with proteinaceous/hemorrhagic contents. No suspicious renal mass. 4. Multiple other nonacute observations, as described above.   Case and imaging was reviewed with IR attending Dr. Hughes. Imaging does not show notably dilated gallbladder and the walled collections adjacent to the gallbladder are not conclusive with acute cholecystitis. Given patient is also otherwise hemodynamically stable, no indicated for  cholecystostomy placement at this time. Recommend continuing antibiotics and following with surgery. If patient condition were to worsen or there are new imaging findings, please reach back out to IR for re-evaluation.   Kimble DEL Gerturde Kuba PA-C 09/28/2024 12:03 PM

## 2024-09-28 NOTE — Progress Notes (Signed)
 PHARMACY - ANTICOAGULATION CONSULT NOTE  Pharmacy Consult for eliquis  >> heparin  Indication: atrial fibrillation  Allergies  Allergen Reactions   Tetanus Toxoid-Containing Vaccines Other (See Comments)    Childhood Allergy    Zetia  [Ezetimibe ] Diarrhea    Patient Measurements: Height: 5' 3 (160 cm) Weight: 70.3 kg (155 lb) IBW/kg (Calculated) : 56.9 HEPARIN  DW (KG): 70.3  Vital Signs: Temp: 99.2 F (37.3 C) (10/11 1933) Temp Source: Oral (10/11 1933) BP: 123/79 (10/11 1933) Pulse Rate: 91 (10/11 1933)  Labs: Recent Labs    09/26/24 0430 09/26/24 1011 09/26/24 1957 09/27/24 0431 09/27/24 1514 09/28/24 0803 09/28/24 1206 09/28/24 2218  HGB 10.9*  --   --  11.1*  --   --   --   --   HCT 35.1*  --   --  35.8*  --   --   --   --   PLT 161  --   --  159  --   --   --   --   APTT  --  38*   < > 149* 94*  --  45* 90*  HEPARINUNFRC  --  0.69  --  1.01*  --  >1.10*  --   --   CREATININE 0.82  --   --  0.89  --   --   --   --    < > = values in this interval not displayed.    Estimated Creatinine Clearance: 62.2 mL/min (by C-G formula based on SCr of 0.89 mg/dL).  Assessment: 56 yoM presented with abdominal pain found to have cholecystitis vs duodenitis on CT. Pharmacy consulted to dose heparin  for atrial fibrillation pending surgery. PMH includes afib s/p cardioversion on  9/26 and on eliquis .  -Last dose of eliquis : 09/25/2024 @1000 , CBC WNL, underwent MRCP on 10/10 concerning for probable small contained perforation of the gallbladder   PM: aPTT within goal on 1200 units/hr. No signs/symptoms of bleeding reported.   Goal of Therapy:  Heparin  level 0.3-0.7 units/ml Monitor platelets by anticoagulation protocol: Yes   Plan:  Continue heparin  infusion at 1200 units/hr Check confirmatory aPTT with AM labs Monitor with aPTTs until correlates with heparin  level Continue to monitor H&H and platelets F/u plans for possible cholecystostomy if patient clinical status  worsens  Lynwood Poplar, PharmD, BCPS Clinical Pharmacist 09/28/2024 11:40 PM

## 2024-09-28 NOTE — Progress Notes (Signed)
 Progress Note   Patient: Kyle Matthews FMW:[id number] DOB: 10/30/1946 DOA: 09/25/2024     3 DOS: the patient was seen and examined on 09/28/2024   Brief hospital course: CC: abd pain, N/V HPI: Kyle Matthews is a 78 y.o. male with medical history significant for hypertension, hyperlipidemia, CAD status post CABG, bioprosthetic AVR, TVR, atrial fibrillation and flutter on Eliquis , history of CVA, COPD, and BPH status post prostatectomy who presented on 09/25/2024 with acute onset upper abdominal pain, nausea, and vomiting.   Patient was in his usual state until the evening when he developed acute onset of back pain and initially which was followed closely by nausea with nonbloody vomiting and then pain in the upper abdomen.  He had not eaten anything for several hours prior to this and was unable to identify any alleviating or exacerbating factors.  Symptoms were similar to what he presented with in September 2025.  He is followed by surgery for symptomatic cholelithiasis but will require cardiac clearance prior to surgery.  Additionally, cardiology has advised that he does not come off of anticoagulation for at least a month following his recent cardioversion.   ED Course: Upon arrival to the ED, patient is found to be afebrile and saturating well on room air with normal RR, normal HR, and stable BP.  Labs are most notable for normal LFTs, normal lipase, and normal WBC.  CT demonstrates cholelithiasis with stable prominence of the CBD and increased inflammatory changes in the right upper quadrant.   Patient was started on Rocephin  and Flagyl in the ED.  Admission Imaging Studies: CT abd/pelvis Cholelithiasis. Prominence of the common bile duct is again identified and stable. Inflammatory changes in the right upper quadrant near the common bile duct and second portion of the duodenum. These have increased in the interval from the prior exam and again may represent acute cholecystitis although the possibility  of duodenitis deserves consideration as well. Diverticulosis without diverticulitis. Bladder wall thickening stable from the prior exam which may represent cystitis.  Assessment and Plan:  # Symptomatic cholelithiasis # Concern for contained perforation of gallbladder neck / cystic duct region - Patient presented with recurrent abdominal pain, nausea and vomiting, with no leukocytosis, normal LFTs, normal lipase. - CT abdomen pelvis showed cholelithiasis, stable CBD prominence, interval worsening of RUQ inflammatory changes near CBD and second portion of duodenum - RUQ US  showed cholelithiasis with mild gallbladder wall thickening, possible CBD dilation recommending MRCP - Underwent MRCP on 10/10 with findings concerning for probable small contained perforation of the gallbladder neck/cystic duct region with resulted mid complex collection - Surgery recommended IR placement of cholecystostomy tube in favor surgical invention with patient's need for continued anticoagulation per cardiology - IR stated no indication for cholecystostomy tube - Continue IV Zosyn  - Continue Protonix  twice daily  #Atrial fibrillation and flutter - Status post cardioversion on 08/24/2024 - Cardiology stating imperative for patient to remain on anticoagulation for 1 month - Currently on heparin  drip, Eliquis  held   #HFpEF - Currently euvolemic and stable  #CAD -Continue Crestor    #HTN -Continue Norvasc      Subjective: Patient seen in bed this morning with wife at bedside.  Overall, he reports doing well and is tolerating clear liquid diet.  Complains of aching RUQ pain, but tolerable.  He appears to be understanding of the complexity of his situation.  Otherwise, he denies any chest pain, shortness of breath, nausea, vomiting, diarrhea, fevers, chills.  Reports his lower extremity swelling has improved dramatically over  the last few days as compared to the previous weeks.  Physical Exam: Vitals:   09/28/24  0553 09/28/24 0738 09/28/24 1611 09/28/24 1933  BP: 130/77 126/66 125/72 123/79  Pulse: 84 81 93 91  Resp: 18 16 16 18   Temp: 98.2 F (36.8 C) 98.5 F (36.9 C) 98.8 F (37.1 C) 99.2 F (37.3 C)  TempSrc:  Oral Oral Oral  SpO2: 95% 95% 95% 95%  Weight:      Height:       Physical Exam Constitutional:      General: He is not in acute distress.    Appearance: He is not ill-appearing.  HENT:     Mouth/Throat:     Mouth: Mucous membranes are moist.  Eyes:     Pupils: Pupils are equal, round, and reactive to light.  Cardiovascular:     Rate and Rhythm: Normal rate and regular rhythm.     Heart sounds: Normal heart sounds. No murmur heard. Pulmonary:     Effort: Pulmonary effort is normal. No respiratory distress.     Breath sounds: Normal breath sounds. No wheezing.  Abdominal:     General: Bowel sounds are normal. There is no distension.     Palpations: Abdomen is soft.     Tenderness: There is abdominal tenderness in the right upper quadrant. There is no guarding.  Musculoskeletal:     Right lower leg: No edema.     Left lower leg: No edema.  Skin:    General: Skin is warm and dry.     Capillary Refill: Capillary refill takes less than 2 seconds.  Neurological:     Mental Status: He is alert and oriented to person, place, and time. Mental status is at baseline.     Family Communication: Discussed with patient and his wife at bedside  Disposition: Status is: Inpatient Remains inpatient appropriate because: Requiring IV antibiotics, pending surgical management  Planned Discharge Destination: Home    Time spent: 45 minutes  Author: Duffy Larch, MD 09/28/2024 9:06 PM  For on call review [email or web address].

## 2024-09-28 NOTE — Plan of Care (Signed)
   Problem: Education: Goal: Knowledge of the procedure and recovery process will improve Outcome: Progressing   Problem: Bowel/Gastric: Goal: Gastrointestinal status for postoperative course will improve Outcome: Progressing   Problem: Pain Management: Goal: General experience of comfort will improve Outcome: Progressing   Problem: Skin Integrity: Goal: Demonstration of wound healing without infection will improve Outcome: Progressing   Problem: Urinary Elimination: Goal: Ability to avoid or minimize complications of infection will improve Outcome: Progressing Goal: Ability to achieve and maintain urine output will improve Outcome: Progressing Goal: Home care management will improve Outcome: Progressing   Problem: Education: Goal: Knowledge of General Education information will improve Description: Including pain rating scale, medication(s)/side effects and non-pharmacologic comfort measures Outcome: Progressing   Problem: Health Behavior/Discharge Planning: Goal: Ability to manage health-related needs will improve Outcome: Progressing   Problem: Clinical Measurements: Goal: Ability to maintain clinical measurements within normal limits will improve Outcome: Progressing Goal: Will remain free from infection Outcome: Progressing Goal: Diagnostic test results will improve Outcome: Progressing Goal: Respiratory complications will improve Outcome: Progressing Goal: Cardiovascular complication will be avoided Outcome: Progressing   Problem: Activity: Goal: Risk for activity intolerance will decrease Outcome: Progressing   Problem: Nutrition: Goal: Adequate nutrition will be maintained Outcome: Progressing   Problem: Coping: Goal: Level of anxiety will decrease Outcome: Progressing   Problem: Elimination: Goal: Will not experience complications related to bowel motility Outcome: Progressing Goal: Will not experience complications related to urinary  retention Outcome: Progressing   Problem: Pain Managment: Goal: General experience of comfort will improve and/or be controlled Outcome: Progressing   Problem: Safety: Goal: Ability to remain free from injury will improve Outcome: Progressing   Problem: Skin Integrity: Goal: Risk for impaired skin integrity will decrease Outcome: Progressing

## 2024-09-29 DIAGNOSIS — K8020 Calculus of gallbladder without cholecystitis without obstruction: Secondary | ICD-10-CM | POA: Diagnosis not present

## 2024-09-29 LAB — COMPREHENSIVE METABOLIC PANEL WITH GFR
ALT: 11 U/L (ref 0–44)
AST: 13 U/L — ABNORMAL LOW (ref 15–41)
Albumin: 2.6 g/dL — ABNORMAL LOW (ref 3.5–5.0)
Alkaline Phosphatase: 67 U/L (ref 38–126)
Anion gap: 10 (ref 5–15)
BUN: 9 mg/dL (ref 8–23)
CO2: 26 mmol/L (ref 22–32)
Calcium: 8.3 mg/dL — ABNORMAL LOW (ref 8.9–10.3)
Chloride: 98 mmol/L (ref 98–111)
Creatinine, Ser: 0.93 mg/dL (ref 0.61–1.24)
GFR, Estimated: >60 mL/min (ref >60)
Glucose, Bld: 93 mg/dL (ref 70–99)
Potassium: 2.9 mmol/L — ABNORMAL LOW (ref 3.5–5.1)
Sodium: 134 mmol/L — ABNORMAL LOW (ref 135–145)
Total Bilirubin: 0.9 mg/dL (ref 0.0–1.2)
Total Protein: 6.1 g/dL — ABNORMAL LOW (ref 6.5–8.1)

## 2024-09-29 LAB — HEPARIN LEVEL (UNFRACTIONATED): Heparin Unfractionated: 0.56 [IU]/mL (ref 0.30–0.70)

## 2024-09-29 LAB — APTT: aPTT: 102 s — ABNORMAL HIGH (ref 24–36)

## 2024-09-29 MED ORDER — FUROSEMIDE 40 MG PO TABS
40.0000 mg | ORAL_TABLET | Freq: Every day | ORAL | Status: DC
  Administered 2024-09-30 – 2024-10-03 (×4): 40 mg via ORAL
  Filled 2024-09-29 (×4): qty 1

## 2024-09-29 MED ORDER — POTASSIUM CHLORIDE CRYS ER 20 MEQ PO TBCR
40.0000 meq | EXTENDED_RELEASE_TABLET | ORAL | Status: AC
  Administered 2024-09-29 (×2): 40 meq via ORAL
  Filled 2024-09-29 (×2): qty 2

## 2024-09-29 NOTE — Plan of Care (Signed)
   Problem: Education: Goal: Knowledge of the procedure and recovery process will improve Outcome: Progressing   Problem: Bowel/Gastric: Goal: Gastrointestinal status for postoperative course will improve Outcome: Progressing   Problem: Pain Management: Goal: General experience of comfort will improve Outcome: Progressing   Problem: Skin Integrity: Goal: Demonstration of wound healing without infection will improve Outcome: Progressing   Problem: Urinary Elimination: Goal: Ability to avoid or minimize complications of infection will improve Outcome: Progressing Goal: Ability to achieve and maintain urine output will improve Outcome: Progressing Goal: Home care management will improve Outcome: Progressing   Problem: Education: Goal: Knowledge of General Education information will improve Description: Including pain rating scale, medication(s)/side effects and non-pharmacologic comfort measures Outcome: Progressing   Problem: Health Behavior/Discharge Planning: Goal: Ability to manage health-related needs will improve Outcome: Progressing   Problem: Clinical Measurements: Goal: Ability to maintain clinical measurements within normal limits will improve Outcome: Progressing Goal: Will remain free from infection Outcome: Progressing Goal: Diagnostic test results will improve Outcome: Progressing Goal: Respiratory complications will improve Outcome: Progressing Goal: Cardiovascular complication will be avoided Outcome: Progressing   Problem: Activity: Goal: Risk for activity intolerance will decrease Outcome: Progressing   Problem: Nutrition: Goal: Adequate nutrition will be maintained Outcome: Progressing   Problem: Coping: Goal: Level of anxiety will decrease Outcome: Progressing   Problem: Elimination: Goal: Will not experience complications related to bowel motility Outcome: Progressing Goal: Will not experience complications related to urinary  retention Outcome: Progressing   Problem: Pain Managment: Goal: General experience of comfort will improve and/or be controlled Outcome: Progressing   Problem: Safety: Goal: Ability to remain free from injury will improve Outcome: Progressing   Problem: Skin Integrity: Goal: Risk for impaired skin integrity will decrease Outcome: Progressing

## 2024-09-29 NOTE — Progress Notes (Signed)
 Subjective/Chief Complaint: PT doing well this AM IR wants to hold off on C-tube   Objective: Vital signs in last 24 hours: Temp:  [98.2 F (36.8 C)-99.2 F (37.3 C)] 98.2 F (36.8 C) (10/12 0757) Pulse Rate:  [84-93] 86 (10/12 0757) Resp:  [16-19] 19 (10/12 0757) BP: (107-126)/(62-79) 126/75 (10/12 0757) SpO2:  [94 %-95 %] 94 % (10/12 0757) Last BM Date : 09/25/24 (States he hasn't eaten has been on a liquid diet)  Intake/Output from previous day: 10/11 0701 - 10/12 0700 In: 3 [I.V.:3] Out: -  Intake/Output this shift: No intake/output data recorded.  PE:  Constitutional: No acute distress, conversant, appears states age. Eyes: Anicteric sclerae, moist conjunctiva, no lid lag Lungs: Clear to auscultation bilaterally, normal respiratory effort CV: regular rate and rhythm, no murmurs, no peripheral edema, pedal pulses 2+ GI: Soft, no masses or hepatosplenomegaly, non-tender to palpation Skin: No rashes, palpation reveals normal turgor Psychiatric: appropriate judgment and insight, oriented to person, place, and time   Lab Results:  Recent Labs    09/27/24 0431  WBC 8.8  HGB 11.1*  HCT 35.8*  PLT 159   BMET Recent Labs    09/27/24 0431 09/29/24 0426  NA 135 134*  K 3.5 2.9*  CL 102 98  CO2 23 26  GLUCOSE 87 93  BUN 14 9  CREATININE 0.89 0.93  CALCIUM  8.9 8.3*   PT/INR No results for input(s): LABPROT, INR in the last 72 hours. ABG No results for input(s): PHART, HCO3 in the last 72 hours.  Invalid input(s): PCO2, PO2  Studies/Results: MR ABDOMEN MRCP W WO CONTAST Result Date: 09/27/2024 CLINICAL DATA:  801171 Common bile duct dilatation 801171; 220388 Abnormal CT of the abdomen 220388 EXAM: MRI ABDOMEN WITHOUT AND WITH CONTRAST (INCLUDING MRCP) TECHNIQUE: Multiplanar multisequence MR imaging of the abdomen was performed both before and after the administration of intravenous contrast. Heavily T2-weighted images of the biliary and  pancreatic ducts were obtained, and three-dimensional MRCP images were rendered by post processing. CONTRAST:  7mL GADAVIST GADOBUTROL 1 MMOL/ML IV SOLN COMPARISON:  Ultrasound abdomen from 09/26/2024 and CT scan abdomen and pelvis from 09/25/2024. FINDINGS: Lower chest: Unremarkable MR appearance to the lung bases. No pleural effusion. No pericardial effusion. Normal heart size. Hepatobiliary: The liver is normal in size. Noncirrhotic configuration. There are 2 simple cysts in the liver with largest in the right hepatic lobe measuring up to 2.1 x 3.3 cm. No suspicious liver lesion. No intrahepatic bile duct dilation. The extrahepatic bile duct is slightly prominent measuring up to 8 mm in the proximal portion, 8 mm in the midportion and gradually tapers up to 6 mm just before the ampulla of Vater. The extrahepatic bile duct diameters are within normal limits for patient's age. No obstructing mass or choledocholithiasis. The gallbladder is physiologically distended and exhibit moderate-to-large amount of innumerable sub 5 mm gallstones. No abnormal gallbladder wall thickening. However, there are 3 adjacent mildly thick enhancing wall collections to the left of the gallbladder and posterior to the extrahepatic bile duct with largest collection measuring up to 1.6 x 2.7 cm (series 34, images 41-46). These collections are grossly similar to the prior study from 09/25/2024 but new since the prior study from 08/20/2024. Collections are adjacent to/inseparable from the gallbladder neck/cystic duct. There is also fat stranding in the right upper quadrant centered around this region. There is apparent transient hepatic intensity difference in the surrounding liver parenchyma (series 21, images 41-45). The duodenal, pancreatic head/uncinate process, right  adrenal and right kidney appears within normal limits and likely not the source of inflammation. These constellation of findings raises the concern for probable small  contained perforation of the gallbladder neck/cystic duct region. Correlate clinically. Pancreas: Normal T1 hyperintense appearance of the pancreas. There is a 1.1 x 1.2 cm multilobulated T2 hyperintense, nonenhancing structure in the uncinate process. There is an additional, 6 x 6 mm similar characteristic structure in the pancreas head/uncinate junction region (series 4, image 20). No direct communication of the structures noted with the pancreatic side branch or main duct. Differential diagnosis includes pancreatic side branch IPMN or pseudocyst. Attention on follow-up examination is recommended. The pancreas is otherwise unremarkable. Main pancreatic duct is not dilated. No peripancreatic fat stranding. Spleen:  Within normal limits in size and appearance. No focal mass. Adrenals/Urinary Tract: Unremarkable adrenal glands. No hydroureteronephrosis on either side. There are multiple bilateral renal cysts with largest arising from the right kidney upper pole measuring up to 5.2 x 6.2 cm. There are also multiple structures with varying amount of T1 hyperintense components with largest T1 hyperintense structure arising from the left kidney interpolar region, anteriorly measuring up to 1.4 x 2.0 cm, which can be characterized as a proteinaceous/hemorrhagic cysts. The other structures with T1 hyperintense contents and lack of enhancement also favor small quantity of dependent proteinaceous/hemorrhagic contents. No suspicious renal mass. Stomach/Bowel: Visualized portions within the abdomen are unremarkable. No disproportionate dilation of bowel loops. Vascular/Lymphatic: No pathologically enlarged lymph nodes identified. No abdominal aortic aneurysm demonstrated. There is trace amount of ascites along the right posteroinferior liver surface Other:  None. Musculoskeletal: No suspicious bone lesions identified. IMPRESSION: 1. There are 3 adjacent mildly thick enhancing walled collections to the left of the gallbladder  and posterior to the extrahepatic bile duct with largest collection measuring up to 1.6 x 2.7 cm. These collections are adjacent to/inseparable from the gallbladder neck/cystic duct. There is also fat stranding in the right upper quadrant centered around this region. There is apparent transient hepatic intensity difference in the surrounding liver parenchyma. The duodenum, pancreatic head/uncinate process, right adrenal and right kidney appears within normal limits and likely not the source of inflammation. These constellation of findings raises the concern for probable small contained perforation of the gallbladder neck/cystic duct region with resultant mild complex collection. Correlate clinically. 2. There is a 1.1 x 1.2 cm multilobulated T2 hyperintense, nonenhancing structure in the uncinate process. There is an additional, 6 x 6 mm similar characteristic structure in the pancreas head/uncinate junction region. No direct communication of the structures noted with the pancreatic side branch or main duct. Differential diagnosis includes pancreatic side branch IPMN or pseudocyst. Attention on follow-up examination is recommended. 3. Multiple bilateral renal simple renal cysts as well as cysts with proteinaceous/hemorrhagic contents. No suspicious renal mass. 4. Multiple other nonacute observations, as described above. Electronically Signed   By: Ree Molt M.D.   On: 09/27/2024 12:17   MR 3D Recon At Scanner Result Date: 09/27/2024 CLINICAL DATA:  801171 Common bile duct dilatation 801171; 220388 Abnormal CT of the abdomen 220388 EXAM: MRI ABDOMEN WITHOUT AND WITH CONTRAST (INCLUDING MRCP) TECHNIQUE: Multiplanar multisequence MR imaging of the abdomen was performed both before and after the administration of intravenous contrast. Heavily T2-weighted images of the biliary and pancreatic ducts were obtained, and three-dimensional MRCP images were rendered by post processing. CONTRAST:  7mL GADAVIST  GADOBUTROL 1 MMOL/ML IV SOLN COMPARISON:  Ultrasound abdomen from 09/26/2024 and CT scan abdomen and pelvis from 09/25/2024. FINDINGS:  Lower chest: Unremarkable MR appearance to the lung bases. No pleural effusion. No pericardial effusion. Normal heart size. Hepatobiliary: The liver is normal in size. Noncirrhotic configuration. There are 2 simple cysts in the liver with largest in the right hepatic lobe measuring up to 2.1 x 3.3 cm. No suspicious liver lesion. No intrahepatic bile duct dilation. The extrahepatic bile duct is slightly prominent measuring up to 8 mm in the proximal portion, 8 mm in the midportion and gradually tapers up to 6 mm just before the ampulla of Vater. The extrahepatic bile duct diameters are within normal limits for patient's age. No obstructing mass or choledocholithiasis. The gallbladder is physiologically distended and exhibit moderate-to-large amount of innumerable sub 5 mm gallstones. No abnormal gallbladder wall thickening. However, there are 3 adjacent mildly thick enhancing wall collections to the left of the gallbladder and posterior to the extrahepatic bile duct with largest collection measuring up to 1.6 x 2.7 cm (series 34, images 41-46). These collections are grossly similar to the prior study from 09/25/2024 but new since the prior study from 08/20/2024. Collections are adjacent to/inseparable from the gallbladder neck/cystic duct. There is also fat stranding in the right upper quadrant centered around this region. There is apparent transient hepatic intensity difference in the surrounding liver parenchyma (series 21, images 41-45). The duodenal, pancreatic head/uncinate process, right adrenal and right kidney appears within normal limits and likely not the source of inflammation. These constellation of findings raises the concern for probable small contained perforation of the gallbladder neck/cystic duct region. Correlate clinically. Pancreas: Normal T1 hyperintense  appearance of the pancreas. There is a 1.1 x 1.2 cm multilobulated T2 hyperintense, nonenhancing structure in the uncinate process. There is an additional, 6 x 6 mm similar characteristic structure in the pancreas head/uncinate junction region (series 4, image 20). No direct communication of the structures noted with the pancreatic side branch or main duct. Differential diagnosis includes pancreatic side branch IPMN or pseudocyst. Attention on follow-up examination is recommended. The pancreas is otherwise unremarkable. Main pancreatic duct is not dilated. No peripancreatic fat stranding. Spleen:  Within normal limits in size and appearance. No focal mass. Adrenals/Urinary Tract: Unremarkable adrenal glands. No hydroureteronephrosis on either side. There are multiple bilateral renal cysts with largest arising from the right kidney upper pole measuring up to 5.2 x 6.2 cm. There are also multiple structures with varying amount of T1 hyperintense components with largest T1 hyperintense structure arising from the left kidney interpolar region, anteriorly measuring up to 1.4 x 2.0 cm, which can be characterized as a proteinaceous/hemorrhagic cysts. The other structures with T1 hyperintense contents and lack of enhancement also favor small quantity of dependent proteinaceous/hemorrhagic contents. No suspicious renal mass. Stomach/Bowel: Visualized portions within the abdomen are unremarkable. No disproportionate dilation of bowel loops. Vascular/Lymphatic: No pathologically enlarged lymph nodes identified. No abdominal aortic aneurysm demonstrated. There is trace amount of ascites along the right posteroinferior liver surface Other:  None. Musculoskeletal: No suspicious bone lesions identified. IMPRESSION: 1. There are 3 adjacent mildly thick enhancing walled collections to the left of the gallbladder and posterior to the extrahepatic bile duct with largest collection measuring up to 1.6 x 2.7 cm. These collections are  adjacent to/inseparable from the gallbladder neck/cystic duct. There is also fat stranding in the right upper quadrant centered around this region. There is apparent transient hepatic intensity difference in the surrounding liver parenchyma. The duodenum, pancreatic head/uncinate process, right adrenal and right kidney appears within normal limits and likely not the source  of inflammation. These constellation of findings raises the concern for probable small contained perforation of the gallbladder neck/cystic duct region with resultant mild complex collection. Correlate clinically. 2. There is a 1.1 x 1.2 cm multilobulated T2 hyperintense, nonenhancing structure in the uncinate process. There is an additional, 6 x 6 mm similar characteristic structure in the pancreas head/uncinate junction region. No direct communication of the structures noted with the pancreatic side branch or main duct. Differential diagnosis includes pancreatic side branch IPMN or pseudocyst. Attention on follow-up examination is recommended. 3. Multiple bilateral renal simple renal cysts as well as cysts with proteinaceous/hemorrhagic contents. No suspicious renal mass. 4. Multiple other nonacute observations, as described above. Electronically Signed   By: Ree Molt M.D.   On: 09/27/2024 12:17    Anti-infectives: Anti-infectives (From admission, onward)    Start     Dose/Rate Route Frequency Ordered Stop   09/26/24 0600  piperacillin -tazobactam (ZOSYN ) IVPB 3.375 g        3.375 g 12.5 mL/hr over 240 Minutes Intravenous Every 8 hours 09/26/24 0207     09/26/24 0000  metroNIDAZOLE (FLAGYL) tablet 500 mg        500 mg Oral  Once 09/25/24 2348 09/26/24 0058   09/25/24 2345  cefTRIAXone  (ROCEPHIN ) 2 g in sodium chloride  0.9 % 100 mL IVPB        2 g 200 mL/hr over 30 Minutes Intravenous  Once 09/25/24 2341 09/26/24 0054       Assessment/Plan: 32M with acute cholecystitis and possible gb perforation   -con't abx per  primary -IR rec no tube at this point and con't with abx -Will adv diet as tol -Following     HTN CAD CHF Afib  LOS: 4 days    Lynda Leos 09/29/2024

## 2024-09-29 NOTE — Progress Notes (Signed)
 K 2.9 ok to replace with x2 per Dr. Mosie.  Sergio Batch, PharmD, BCIDP, AAHIVP, CPP Infectious Disease Pharmacist 09/29/2024 8:53 AM

## 2024-09-29 NOTE — Progress Notes (Signed)
 PHARMACY - ANTICOAGULATION CONSULT NOTE  Pharmacy Consult for eliquis  >> heparin  Indication: atrial fibrillation  Allergies  Allergen Reactions   Tetanus Toxoid-Containing Vaccines Other (See Comments)    Childhood Allergy    Zetia  [Ezetimibe ] Diarrhea    Patient Measurements: Height: 5' 3 (160 cm) Weight: 70.3 kg (155 lb) IBW/kg (Calculated) : 56.9 HEPARIN  DW (KG): 70.3  Vital Signs: Temp: 98.2 F (36.8 C) (10/12 0757) Temp Source: Oral (10/12 0757) BP: 126/75 (10/12 0757) Pulse Rate: 86 (10/12 0757)  Labs: Recent Labs    09/27/24 0431 09/27/24 1514 09/28/24 0803 09/28/24 1206 09/28/24 2218 09/29/24 0426  HGB 11.1*  --   --   --   --   --   HCT 35.8*  --   --   --   --   --   PLT 159  --   --   --   --   --   APTT 149*   < >  --  45* 90* 102*  HEPARINUNFRC 1.01*  --  >1.10*  --   --  0.56  CREATININE 0.89  --   --   --   --  0.93   < > = values in this interval not displayed.    Estimated Creatinine Clearance: 59.5 mL/min (by C-G formula based on SCr of 0.93 mg/dL).  Assessment: 43 yoM presented with abdominal pain found to have cholecystitis vs duodenitis on CT. Pharmacy consulted to dose heparin  for atrial fibrillation pending surgery. PMH includes afib s/p cardioversion on  9/26 and on eliquis .  -Last dose of eliquis : 09/25/2024 @1000 , CBC WNL, underwent MRCP on 10/10 concerning for probable small contained perforation of the gallbladder   PTT and heparin  level seem to be correlating. HL 0.56, PTT 102. Since the PTT is on the higher side, we will decrease heparin  slightly and check both one more time in AM.    Goal of Therapy:  Heparin  level 0.3-0.7 units/ml Monitor platelets by anticoagulation protocol: Yes   Plan:  Decrease heparin  infusion at 1150 units/hr Monitor with aPTTs until correlates with heparin  level Continue to monitor H&H and platelets F/u plans for possible cholecystostomy if patient clinical status worsens  Sergio Batch, PharmD, BCIDP,  AAHIVP, CPP Infectious Disease Pharmacist 09/29/2024 8:45 AM

## 2024-09-29 NOTE — Progress Notes (Signed)
 Progress Note   Patient: Kyle Matthews FMW:[id number] DOB: [birth year] DOA: 09/25/2024     4 DOS: the patient was seen and examined on 09/29/2024   Brief hospital course: CC: abd pain, N/V HPI: Kyle Matthews is a 78 y.o. male with medical history significant for hypertension, hyperlipidemia, CAD status post CABG, bioprosthetic AVR, TVR, atrial fibrillation and flutter on Eliquis , history of CVA, COPD, and BPH status post prostatectomy who presented on 09/25/2024 with acute onset upper abdominal pain, nausea, and vomiting.   Patient was in his usual state until the evening when he developed acute onset of back pain and initially which was followed closely by nausea with nonbloody vomiting and then pain in the upper abdomen.  He had not eaten anything for several hours prior to this and was unable to identify any alleviating or exacerbating factors.  Symptoms were similar to what he presented with in September 2025.  He is followed by surgery for symptomatic cholelithiasis but will require cardiac clearance prior to surgery.  Additionally, cardiology has advised that he does not come off of anticoagulation for at least a month following his recent cardioversion.   ED Course: Upon arrival to the ED, patient is found to be afebrile and saturating well on room air with normal RR, normal HR, and stable BP.  Labs are most notable for normal LFTs, normal lipase, and normal WBC.  CT demonstrates cholelithiasis with stable prominence of the CBD and increased inflammatory changes in the right upper quadrant.   Patient was started on Rocephin  and Flagyl in the ED.  Admission Imaging Studies: CT abd/pelvis Cholelithiasis. Prominence of the common bile duct is again identified and stable. Inflammatory changes in the right upper quadrant near the common bile duct and second portion of the duodenum. These have increased in the interval from the prior exam and again may represent acute cholecystitis although the possibility  of duodenitis deserves consideration as well. Diverticulosis without diverticulitis. Bladder wall thickening stable from the prior exam which may represent cystitis.  Assessment and Plan:  # Symptomatic cholelithiasis # Concern for contained perforation of gallbladder neck / cystic duct region - Patient presented with recurrent abdominal pain, nausea and vomiting, with no leukocytosis, normal LFTs, normal lipase. - CT abdomen pelvis showed cholelithiasis, stable CBD prominence, interval worsening of RUQ inflammatory changes near CBD and second portion of duodenum - RUQ US  showed cholelithiasis with mild gallbladder wall thickening, possible CBD dilation recommending MRCP - Underwent MRCP on 10/10 with findings concerning for probable small contained perforation of the gallbladder neck/cystic duct region with resulted mid complex collection - Surgery recommended IR placement of cholecystostomy tube in favor surgical invention with patient's need for continued anticoagulation per cardiology - IR stated no indication for cholecystostomy tube - Continue IV Zosyn  - Continue Protonix  twice daily - Plan to consult ID for antibiotic recommendations  #Atrial fibrillation and flutter - Status post cardioversion on 08/24/2024 - Cardiology stating imperative for patient to remain on anticoagulation for 1 month - Currently on heparin  drip, Eliquis  held  #Hypokalemia - K 2.9 - KCl 40 mEq x 2   #HFpEF - Currently euvolemic and stable - Patient reports dramatic improvement in lower extremity edema over the last few days in comparison to previous weeks. - Discontinued Lasix  40 mg daily as patient is euvolemic and refusing it anyway  #CAD -Continue Crestor   #HTN -Continue Norvasc      Subjective: Patient seen in bed this morning with wife at bedside.  Overall, he reports doing well.  He's in good spirits today as he's not tolerating a regular diet and was enjoying his [place] sandwich. Denies any  concerns at this time.   Physical Exam: Vitals:   09/28/24 1611 09/28/24 1933 09/29/24 0400 09/29/24 0757  BP: 125/72 123/79 107/62 126/75  Pulse: 93 91 84 86  Resp: 16 18 18 19   Temp: 98.8 F (37.1 C) 99.2 F (37.3 C) 98.5 F (36.9 C) 98.2 F (36.8 C)  TempSrc: Oral Oral Oral Oral  SpO2: 95% 95% 95% 94%  Weight:      Height:       Physical Exam Constitutional:      General: He is not in acute distress.    Appearance: He is not ill-appearing.  HENT:     Mouth/Throat:     Mouth: Mucous membranes are moist.  Eyes:     Pupils: Pupils are equal, round, and reactive to light.  Cardiovascular:     Rate and Rhythm: Normal rate and regular rhythm.     Heart sounds: Normal heart sounds. No murmur heard. Pulmonary:     Effort: Pulmonary effort is normal. No respiratory distress.     Breath sounds: Normal breath sounds. No wheezing.  Abdominal:     General: Bowel sounds are normal. There is no distension.     Palpations: Abdomen is soft.     Tenderness: There is abdominal tenderness in the right upper quadrant. There is no guarding.  Musculoskeletal:     Right lower leg: No edema.     Left lower leg: No edema.  Skin:    General: Skin is warm and dry.     Capillary Refill: Capillary refill takes less than 2 seconds.  Neurological:     Mental Status: He is alert and oriented to person, place, and time. Mental status is at baseline.     Family Communication: Discussed with patient and his wife at bedside  Disposition: Status is: Inpatient Remains inpatient appropriate because: On IV antibiotics pending final antibiotics determination  Planned Discharge Destination: Home    Time spent: 37 minutes  Author: Duffy Larch, MD 09/29/2024 8:58 AM  For on call review [email or web address].

## 2024-09-30 ENCOUNTER — Inpatient Hospital Stay (HOSPITAL_COMMUNITY)

## 2024-09-30 DIAGNOSIS — K8020 Calculus of gallbladder without cholecystitis without obstruction: Secondary | ICD-10-CM | POA: Diagnosis not present

## 2024-09-30 LAB — BASIC METABOLIC PANEL WITH GFR
Anion gap: 11 (ref 5–15)
BUN: 12 mg/dL (ref 8–23)
CO2: 23 mmol/L (ref 22–32)
Calcium: 8.5 mg/dL — ABNORMAL LOW (ref 8.9–10.3)
Chloride: 100 mmol/L (ref 98–111)
Creatinine, Ser: 0.87 mg/dL (ref 0.61–1.24)
GFR, Estimated: >60 mL/min (ref >60)
Glucose, Bld: 112 mg/dL — ABNORMAL HIGH (ref 70–99)
Potassium: 3.6 mmol/L (ref 3.5–5.1)
Sodium: 134 mmol/L — ABNORMAL LOW (ref 135–145)

## 2024-09-30 LAB — HEPATIC FUNCTION PANEL
ALT: 11 U/L (ref 0–44)
AST: 15 U/L (ref 15–41)
Albumin: 2.7 g/dL — ABNORMAL LOW (ref 3.5–5.0)
Alkaline Phosphatase: 79 U/L (ref 38–126)
Bilirubin, Direct: 0.1 mg/dL (ref 0.0–0.2)
Indirect Bilirubin: 0.5 mg/dL (ref 0.3–0.9)
Total Bilirubin: 0.6 mg/dL (ref 0.0–1.2)
Total Protein: 6.6 g/dL (ref 6.5–8.1)

## 2024-09-30 LAB — CBC
HCT: 34.4 % — ABNORMAL LOW (ref 39.0–52.0)
Hemoglobin: 10.8 g/dL — ABNORMAL LOW (ref 13.0–17.0)
MCH: 25.8 pg — ABNORMAL LOW (ref 26.0–34.0)
MCHC: 31.4 g/dL (ref 30.0–36.0)
MCV: 82.1 fL (ref 80.0–100.0)
Platelets: 179 K/uL (ref 150–400)
RBC: 4.19 MIL/uL — ABNORMAL LOW (ref 4.22–5.81)
RDW: 15.7 % — ABNORMAL HIGH (ref 11.5–15.5)
WBC: 8.5 K/uL (ref 4.0–10.5)
nRBC: 0 % (ref 0.0–0.2)

## 2024-09-30 LAB — APTT: aPTT: 84 s — ABNORMAL HIGH (ref 24–36)

## 2024-09-30 LAB — HEPARIN LEVEL (UNFRACTIONATED): Heparin Unfractionated: 0.44 [IU]/mL (ref 0.30–0.70)

## 2024-09-30 NOTE — Progress Notes (Signed)
 [facility] Progress Note     Subjective: CC:  Reports severe pain last night in his RUQ/R Flank. Was doing ok around lunch-time and had a [place] sandwich. Did not eat much dinner but then proceeded to have pain for a lot of the evening. Denies nausea and vomiting.  Objective: Vital signs in last 24 hours: Temp:  [98 F (36.7 C)-99.2 F (37.3 C)] 98 F (36.7 C) (10/13 0503) Pulse Rate:  [79-90] 90 (10/13 0503) Resp:  [16-19] 16 (10/13 0503) BP: (117-137)/(66-76) 125/76 (10/13 0503) SpO2:  [93 %-98 %] 93 % (10/13 0503) Last BM Date : 09/25/24  Intake/Output from previous day: 10/12 0701 - 10/13 0700 In: -  Out: 200 [Urine:200] Intake/Output this shift: No intake/output data recorded.  PE: Gen:  Alert, NAD, pleasant Card:  Regular rate and rhythm Pulm:  Normal effort, shallow breathing due to abdominal pain. Abd: Soft, TTP RUQ and RLQ with voluntary guarding, no peritonitis - palpation of left hemi abdomen does not cause right-sided pain. Skin: warm and dry, no rashes  Psych: A&Ox3   Lab Results:  Recent Labs    09/30/24 0559  WBC 8.5  HGB 10.8*  HCT 34.4*  PLT 179   BMET Recent Labs    09/29/24 0426 09/30/24 0559  NA 134* 134*  K 2.9* 3.6  CL 98 100  CO2 26 23  GLUCOSE 93 112*  BUN 9 12  CREATININE 0.93 0.87  CALCIUM  8.3* 8.5*   PT/INR No results for input(s): LABPROT, INR in the last 72 hours. CMP     Component Value Date/Time   NA 134 (L) 09/30/2024 0559   K 3.6 09/30/2024 0559   CL 100 09/30/2024 0559   CO2 23 09/30/2024 0559   GLUCOSE 112 (H) 09/30/2024 0559   BUN 12 09/30/2024 0559   CREATININE 0.87 09/30/2024 0559   CREATININE 0.91 09/11/2024 1353   CALCIUM  8.5 (L) 09/30/2024 0559   PROT 6.1 (L) 09/29/2024 0426   PROT 6.6 01/29/2024 1633   ALBUMIN  2.6 (L) 09/29/2024 0426   ALBUMIN  4.1 01/29/2024 1633   AST 13 (L) 09/29/2024 0426   ALT 11 09/29/2024 0426   ALKPHOS 67 09/29/2024 0426   BILITOT 0.9 09/29/2024 0426    BILITOT <0.2 01/29/2024 1633   GFRNONAA >60 09/30/2024 0559   Lipase     Component Value Date/Time   LIPASE 24 09/25/2024 2056       Studies/Results: DG ABD ACUTE 2+V W 1V CHEST Result Date: 09/30/2024 CLINICAL DATA:  Abdominal pain EXAM: DG ABDOMEN ACUTE WITH 1 VIEW CHEST COMPARISON:  08/08/2019 FINDINGS: Cardiac shadow is stable. Postsurgical changes are seen. Pacing device is noted. Lungs are well aerated bilaterally. No focal infiltrate or effusion is seen. Scattered large and small bowel gas is noted. No free air is seen. Air-fluid levels are seen likely related to ileus. No definitive obstructive changes are seen. Changes of prior embolization are noted in the left hemipelvis. No bony abnormality is noted. IMPRESSION: Findings suspicious for small bowel ileus. Electronically Signed   By: Oneil Devonshire M.D.   On: 09/30/2024 01:23    Anti-infectives: Anti-infectives (From admission, onward)    Start     Dose/Rate Route Frequency Ordered Stop   09/26/24 0600  piperacillin -tazobactam (ZOSYN ) IVPB 3.375 g        3.375 g 12.5 mL/hr over 240 Minutes Intravenous Every 8 hours 09/26/24 0207     09/26/24 0000  metroNIDAZOLE (FLAGYL) tablet 500 mg  500 mg Oral  Once 09/25/24 2348 09/26/24 0058   09/25/24 2345  cefTRIAXone  (ROCEPHIN ) 2 g in sodium chloride  0.9 % 100 mL IVPB        2 g 200 mL/hr over 30 Minutes Intravenous  Once 09/25/24 2341 09/26/24 0054        Assessment/Plan  30M with acute cholecystitis and possible gb perforation  - IR consulted for drainage but GB is decompressed and collections adjacent to gallbladder are not conclusive with cholecystitis they feel perc chole tube is not indicated. - patient is a high risk cardiac patient, underwent DCCV 9/26 and ideally would get 4 weeks of uninterrupted anticoagulation. If procedure is deemed urgent then could be switched to heparin  gtt. See cardiology note from 10/9.  - the patient is symptomatic at present with  increasaed RUQ pain after eating, severe pain requiring narcotics, and shallow respirations due to the pain. He is hemodynamically stable. Continue antibiotics. Failure to improve may warrant re-engaging IR for perc chole tube. Surgery would be higher risk for open cholecystectomy and/or subtotal.     LOS: 5 days   I reviewed nursing notes, Consultant cardiology. IR notes, hospitalist notes, last 24 h vitals and pain scores, last 48 h intake and output, last 24 h labs and trends, and last 24 h imaging results.  This care required moderate level of medical decision making.   Almarie Pringle, PA-C [facility] Please see Amion for pager number during day hours 7:00am-4:30pm

## 2024-09-30 NOTE — Progress Notes (Signed)
    [facility]  Date of Admission:  09/25/2024      Total days of antibiotics 6         BRIEF CONSULT NOTE:  Received consult regarding [name] who is a 78 y/o caucasian male admitted with upper abdominal pain and found to have imaging concerning for acute cholecystitis and possible gall bladder perforation. IR consulted and percutaneous chole tube not indicated. High risk cardiac patient with recent DCCV and in need of anticoagulation with General Surgery continuing antibiotics for now. Afebrile, no leukocytosis and stable currently. Agree with piperacillin -tazobactam. Formal consult to follow tomorrow.    Kyle Granite Godman, NP [facility]  09/30/2024  4:20 PM

## 2024-09-30 NOTE — Progress Notes (Addendum)
 Notified by RN of worsening abd pain, treated with IV fentanyl . On my evaluation noted pain had suddenly increased 4-> 8/10 and improved with IV meds. On exam there is severe tenderness and guarding in the RUQ, and mild rebound. Appears likely change in abd exam from prior. Will assess with acute abd series., general surgery involvement pending results.   Kyle Dawson, MD  [facility]   Update:  AAS with no evidence of free air, think that he is just symptomatic from his ongoing complicated cholecystitis. Will watch closely overnight with serial Abd exam, if any worsening will have general surgery evaluate. In either case should have general surgery see in the morning to reassess for surgical management

## 2024-09-30 NOTE — Progress Notes (Signed)
 Mobility Specialist Progress Note:    09/30/24 1102  Mobility  Activity  (Bed mobility)  Range of Motion/Exercises Active;Right leg;Left leg  Activity Response Tolerated well  Mobility Referral Yes  Mobility visit 1 Mobility  Mobility Specialist Start Time (ACUTE ONLY) 1046  Mobility Specialist Stop Time (ACUTE ONLY) 1056  Mobility Specialist Time Calculation (min) (ACUTE ONLY) 10 min   Received pt in bed and declined OOB mobility d/t pain worsening when ambulating. Pt completed x10 BLE leg lifts and ankle pumps. Pt returned supine in bed. Personal belongings and call light within reach. All needs met.  Lavanda Pollack Mobility Specialist  Please contact via [employer] or  Rehab Office [phone]

## 2024-09-30 NOTE — Plan of Care (Signed)
   Problem: Education: Goal: Knowledge of the procedure and recovery process will improve Outcome: Progressing   Problem: Bowel/Gastric: Goal: Gastrointestinal status for postoperative course will improve Outcome: Progressing   Problem: Pain Management: Goal: General experience of comfort will improve Outcome: Progressing   Problem: Skin Integrity: Goal: Demonstration of wound healing without infection will improve Outcome: Progressing   Problem: Urinary Elimination: Goal: Ability to avoid or minimize complications of infection will improve Outcome: Progressing Goal: Ability to achieve and maintain urine output will improve Outcome: Progressing Goal: Home care management will improve Outcome: Progressing   Problem: Education: Goal: Knowledge of General Education information will improve Description: Including pain rating scale, medication(s)/side effects and non-pharmacologic comfort measures Outcome: Progressing   Problem: Health Behavior/Discharge Planning: Goal: Ability to manage health-related needs will improve Outcome: Progressing   Problem: Clinical Measurements: Goal: Ability to maintain clinical measurements within normal limits will improve Outcome: Progressing Goal: Will remain free from infection Outcome: Progressing Goal: Diagnostic test results will improve Outcome: Progressing Goal: Respiratory complications will improve Outcome: Progressing Goal: Cardiovascular complication will be avoided Outcome: Progressing   Problem: Activity: Goal: Risk for activity intolerance will decrease Outcome: Progressing   Problem: Nutrition: Goal: Adequate nutrition will be maintained Outcome: Progressing   Problem: Coping: Goal: Level of anxiety will decrease Outcome: Progressing   Problem: Elimination: Goal: Will not experience complications related to bowel motility Outcome: Progressing Goal: Will not experience complications related to urinary  retention Outcome: Progressing   Problem: Pain Managment: Goal: General experience of comfort will improve and/or be controlled Outcome: Progressing   Problem: Safety: Goal: Ability to remain free from injury will improve Outcome: Progressing   Problem: Skin Integrity: Goal: Risk for impaired skin integrity will decrease Outcome: Progressing

## 2024-09-30 NOTE — Progress Notes (Signed)
 Progress Note   Patient: Kyle Matthews FMW:[id number] DOB: 1946/06/20 DOA: 09/25/2024     5 DOS: the patient was seen and examined on 09/30/2024   Brief hospital course: CC: abd pain, N/V HPI: Kyle Matthews is a 78 y.o. male with medical history significant for hypertension, hyperlipidemia, CAD status post CABG, bioprosthetic AVR, TVR, atrial fibrillation and flutter on Eliquis , history of CVA, COPD, and BPH status post prostatectomy who presented on 09/25/2024 with acute onset upper abdominal pain, nausea, and vomiting.   Patient was in his usual state until the evening when he developed acute onset of back pain and initially which was followed closely by nausea with nonbloody vomiting and then pain in the upper abdomen.  He had not eaten anything for several hours prior to this and was unable to identify any alleviating or exacerbating factors.  Symptoms were similar to what he presented with in September 2025.  He is followed by surgery for symptomatic cholelithiasis but will require cardiac clearance prior to surgery.  Additionally, cardiology has advised that he does not come off of anticoagulation for at least a month following his recent cardioversion.   ED Course: Upon arrival to the ED, patient is found to be afebrile and saturating well on room air with normal RR, normal HR, and stable BP.  Labs are most notable for normal LFTs, normal lipase, and normal WBC.  CT demonstrates cholelithiasis with stable prominence of the CBD and increased inflammatory changes in the right upper quadrant.   Patient was started on Rocephin  and Flagyl in the ED.  Admission Imaging Studies: CT abd/pelvis Cholelithiasis. Prominence of the common bile duct is again identified and stable. Inflammatory changes in the right upper quadrant near the common bile duct and second portion of the duodenum. These have increased in the interval from the prior exam and again may represent acute cholecystitis although the possibility  of duodenitis deserves consideration as well. Diverticulosis without diverticulitis. Bladder wall thickening stable from the prior exam which may represent cystitis.  Assessment and Plan:  # Symptomatic cholelithiasis # Concern for contained perforation of gallbladder neck / cystic duct region - Patient presented with recurrent abdominal pain, nausea and vomiting, with no leukocytosis, normal LFTs, normal lipase. - CT abdomen pelvis showed cholelithiasis, stable CBD prominence, interval worsening of RUQ inflammatory changes near CBD and second portion of duodenum - RUQ US  showed cholelithiasis with mild gallbladder wall thickening, possible CBD dilation recommending MRCP - Underwent MRCP on 10/10 with findings concerning for probable small contained perforation of the gallbladder neck/cystic duct region with resulted mid complex collection - Surgery recommended IR placement of cholecystostomy tube in favor surgical invention with patient's need for continued anticoagulation per cardiology - IR stated no indication for cholecystostomy tube - Given no plans for surgical or IR intervention, will plan to treat with antibiotics until surgery is feasible from a cardiac standpoint - Continue IV Zosyn  - Continue Protonix  twice daily - ID consulted for antibiotics recommendation and duration of therapy given concern for contained gallbladder perforation - Patient had episode of severe pain overnight requiring IV fentanyl . However, his pain was well-controlled during the day today.  #Atrial fibrillation and flutter - Status post cardioversion on 08/24/2024 - Cardiology stating imperative for patient to remain on anticoagulation for 1 month - Currently on heparin  drip, Eliquis  held  #Hypokalemia -- resolved  #HFpEF - Currently euvolemic and stable - Patient reports dramatic improvement in lower extremity edema over the last few days in comparison to previous weeks. -  Continue home Lasix  40 mg  daily  #CAD -Continue Crestor   #HTN -Continue Norvasc      Subjective: Patient seen in bed this morning with wife at bedside.  He had a rough night, reporting 10 out of 10 right upper quadrant pain that required IV fentanyl .  Has been doing okay most of the day but has been apprehensive about eating, only eating oatmeal for breakfast.  Encouraged him to attempt to eat something soft and easy to digest for dinner.  Denies fevers, chills, nausea, vomiting.  Physical Exam: Vitals:   09/29/24 1940 09/30/24 0503 09/30/24 0855 09/30/24 2019  BP: 137/73 125/76 (!) 140/76 124/72  Pulse: 87 90 88 84  Resp: 18 16 16 17   Temp: 99.2 F (37.3 C) 98 F (36.7 C) 97.7 F (36.5 C) 98.1 F (36.7 C)  TempSrc: Oral Oral Oral Oral  SpO2: 94% 93% 94% 93%  Weight:      Height:       Physical Exam Constitutional:      General: He is not in acute distress.    Appearance: He is not ill-appearing.  HENT:     Mouth/Throat:     Mouth: Mucous membranes are moist.  Eyes:     Pupils: Pupils are equal, round, and reactive to light.  Cardiovascular:     Rate and Rhythm: Normal rate and regular rhythm.     Heart sounds: Normal heart sounds. No murmur heard. Pulmonary:     Effort: Pulmonary effort is normal. No respiratory distress.     Breath sounds: Normal breath sounds. No wheezing.  Abdominal:     General: Bowel sounds are normal. There is no distension.     Palpations: Abdomen is soft.     Tenderness: There is abdominal tenderness in the right upper quadrant. There is no guarding.  Musculoskeletal:     Right lower leg: No edema.     Left lower leg: No edema.  Skin:    General: Skin is warm and dry.     Capillary Refill: Capillary refill takes less than 2 seconds.  Neurological:     Mental Status: He is alert and oriented to person, place, and time. Mental status is at baseline.     Family Communication: Discussed with patient and his wife at bedside  Disposition: Status is:  Inpatient Remains inpatient appropriate because: On IV antibiotics pending final antibiotics determination  Planned Discharge Destination: Home    Time spent: 40 minutes  Author: Duffy Larch, MD 09/30/2024 9:03 PM  For on call review [email or web address].

## 2024-09-30 NOTE — TOC Initial Note (Signed)
 Transition of Care [facility]) - Initial/Assessment Note    Patient Details  Name: Kyle Matthews MRN: [id number] Date of Birth: 22-Oct-1946  [facility]) CM/SW Contact:    Lauraine FORBES Saa, LCSWA Phone Number: 09/30/2024, 9:51 AM  Clinical Narrative:                  9:52 AM CSW introduced self and role to patient. Patient's spouse was also present. Patient consented CSW to speak to and in front of spouse. Patient confirmed he resides at home with spouse who is to provide transportation for discharge and medical appointments upon discharge if needed. Patient informed CSW that patient drove self to appointments prior to current admission. Patient confirmed that he does not have SNF history. Patient confirmed that he has DME (shower chair, RW, cane) at home that he does not use. Patient confirmed that he has HH ([facility], [facility], [facility]) history. Per chart review, patient has a PCP and insurance. Patient's preferred pharmacy is [facility] [phone] [place]. No TOC needs identified at this time. TOC will continue to follow.  Expected Discharge Plan: Home/Self Care Barriers to Discharge: Continued Medical Work up   Patient Goals and CMS Choice Patient states their goals for this hospitalization and ongoing recovery are:: to return home          Expected Discharge Plan and Services       Living arrangements for the past 2 months: Single Family Home                                      Prior Living Arrangements/Services Living arrangements for the past 2 months: Single Family Home Lives with:: Spouse Patient language and need for interpreter reviewed:: Yes Do you feel safe going back to the place where you live?: Yes      Need for Family Participation in Patient Care: No (Comment) Care giver support system in place?: Yes (comment) Current home services: DME Criminal Activity/Legal Involvement Pertinent to Current Situation/Hospitalization: No - Comment as  needed  Activities of Daily Living   ADL Screening (condition at time of admission) Independently performs ADLs?: Yes (appropriate for developmental age) Is the patient deaf or have difficulty hearing?: No Does the patient have difficulty seeing, even when wearing glasses/contacts?: No Does the patient have difficulty concentrating, remembering, or making decisions?: No  Permission Sought/Granted Permission sought to share information with : Family Supports Permission granted to share information with : Yes, Verbal Permission Granted  Share Information with NAME: Garl Speigner     Permission granted to share info w Relationship: Spouse  Permission granted to share info w Contact Information: [phone]  Emotional Assessment Appearance:: Appears stated age Attitude/Demeanor/Rapport: Engaged Affect (typically observed): Accepting, Appropriate, Adaptable, Calm, Stable, Pleasant Orientation: : Oriented to Self, Oriented to Place, Oriented to  Time, Oriented to Situation Alcohol / Substance Use: Not Applicable Psych Involvement: No (comment)  Admission diagnosis:  Acute cholecystitis [K81.0] Cholecystitis [K81.9] Duodenitis [K29.80] Patient Active Problem List   Diagnosis Date Noted   Perforation of gallbladder in cholecystitis 09/27/2024   Atrial flutter (HCC) 09/13/2024   Recurrent biliary colic 08/21/2024   Symptomatic cholelithiasis 08/20/2024   Atrial fibrillation and flutter (HCC) 08/20/2024   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   S/P [place] Procedure 07/01/2024   History of stroke 06/18/2024   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   S/P AVR (aortic valve replacement) 11/02/2023   Chronic heart failure with preserved ejection fraction (HFpEF) (HCC) 11/01/2023   Benign hypertension 10/30/2023   Atherosclerosis of native coronary artery of native heart without angina pectoris 10/28/2023    GERD (gastroesophageal reflux disease) 10/23/2023   Coronary artery disease involving native coronary artery of native heart with unstable angina pectoris (HCC) 10/10/2023   BPH with urinary obstruction 10/04/2023   Kidney lesion, native, left 10/03/2023   Lesion of right native kidney 10/03/2023   Elevated brain natriuretic peptide (BNP) level 10/03/2023   Coronary artery disease 10/03/2023   Enlarged prostate 10/03/2023   Moderate aortic regurgitation 10/03/2023   Hyperlipidemia 05/20/2017   Nicotine dependence, cigarettes, uncomplicated 05/20/2017   Carotid bruit 05/19/2017   Essential hypertension    RBBB 04/13/2017   PCP:  Espinoza, Alejandra, DO Pharmacy:   [facility] [phone] - [place], [place] - [address] [place] [address] Phone: [phone] Fax: [phone]     Social Drivers of Health (SDOH) Social History: SDOH Screenings   Food Insecurity: No Food Insecurity (09/26/2024)  Housing: Low Risk  (09/26/2024)  Transportation Needs: No Transportation Needs (09/26/2024)  Utilities: Not At Risk (09/26/2024)  Depression (PHQ2-9): High Risk (02/12/2024)  Social Connections: Socially Integrated (09/26/2024)  Tobacco Use: High Risk (09/26/2024)   SDOH Interventions:     Readmission Risk Interventions    11/08/2023   10:21 AM 10/25/2023    3:42 PM  Readmission Risk Prevention Plan  Transportation Screening Complete Complete  PCP or Specialist Appt within 3-5 Days  Complete  HRI or Home Care Consult  Complete  Palliative Care Screening  Not Applicable  Medication Review (RN Care Manager)  Complete

## 2024-09-30 NOTE — Progress Notes (Signed)
 PHARMACY - ANTICOAGULATION CONSULT NOTE  Pharmacy Consult for eliquis  >> heparin  Indication: atrial fibrillation  Allergies  Allergen Reactions   Tetanus Toxoid-Containing Vaccines Other (See Comments)    Childhood Allergy    Zetia  [Ezetimibe ] Diarrhea    Patient Measurements: Height: 5' 3 (160 cm) Weight: 70.3 kg (155 lb) IBW/kg (Calculated) : 56.9 HEPARIN  DW (KG): 70.3  Vital Signs: Temp: 98 F (36.7 C) (10/13 0503) Temp Source: Oral (10/13 0503) BP: 125/76 (10/13 0503) Pulse Rate: 90 (10/13 0503)  Labs: Recent Labs    09/28/24 0803 09/28/24 1206 09/28/24 2218 09/29/24 0426 09/30/24 0559  HGB  --   --   --   --  10.8*  HCT  --   --   --   --  34.4*  PLT  --   --   --   --  179  APTT  --    < > 90* 102* 84*  HEPARINUNFRC >1.10*  --   --  0.56 0.44  CREATININE  --   --   --  0.93 0.87   < > = values in this interval not displayed.    Estimated Creatinine Clearance: 63.7 mL/min (by C-G formula based on SCr of 0.87 mg/dL).  Assessment: 37 yoM presented with abdominal pain found to have cholecystitis vs duodenitis on CT. Pharmacy consulted to dose heparin  for atrial fibrillation pending surgery. PMH includes afib s/p cardioversion on  9/26 and on eliquis .  -Last dose of eliquis : 09/25/2024 @1000 , CBC WNL, underwent MRCP on 10/10 concerning for probable small contained perforation of the gallbladder   Heparin  level 0.44 after recent dose decrease. Correlates with aPTT of 84. Heparin  level monitoring only going forward. Hb 10.8, plt 179.   Goal of Therapy:  Heparin  level 0.3-0.7 units/ml Monitor platelets by anticoagulation protocol: Yes   Plan:  Continue heparin  infusion at 1150 units/hr Continue to monitor H&H and platelets F/u plans for possible cholecystostomy if patient clinical status worsens  Rankin Sams, PharmD, BCPS, BCCCP Clinical Pharmacist

## 2024-10-01 DIAGNOSIS — K82A2 Perforation of gallbladder in cholecystitis: Secondary | ICD-10-CM | POA: Diagnosis not present

## 2024-10-01 DIAGNOSIS — K8020 Calculus of gallbladder without cholecystitis without obstruction: Secondary | ICD-10-CM | POA: Diagnosis not present

## 2024-10-01 DIAGNOSIS — R21 Rash and other nonspecific skin eruption: Secondary | ICD-10-CM | POA: Diagnosis not present

## 2024-10-01 DIAGNOSIS — I4891 Unspecified atrial fibrillation: Secondary | ICD-10-CM | POA: Diagnosis not present

## 2024-10-01 LAB — CBC
HCT: 32.3 % — ABNORMAL LOW (ref 39.0–52.0)
Hemoglobin: 10.1 g/dL — ABNORMAL LOW (ref 13.0–17.0)
MCH: 25.9 pg — ABNORMAL LOW (ref 26.0–34.0)
MCHC: 31.3 g/dL (ref 30.0–36.0)
MCV: 82.8 fL (ref 80.0–100.0)
Platelets: 159 K/uL (ref 150–400)
RBC: 3.9 MIL/uL — ABNORMAL LOW (ref 4.22–5.81)
RDW: 15.6 % — ABNORMAL HIGH (ref 11.5–15.5)
WBC: 6.6 K/uL (ref 4.0–10.5)
nRBC: 0 % (ref 0.0–0.2)

## 2024-10-01 LAB — HEPARIN LEVEL (UNFRACTIONATED): Heparin Unfractionated: 0.39 [IU]/mL (ref 0.30–0.70)

## 2024-10-01 NOTE — Progress Notes (Signed)
 Progress Note   Patient: Kyle Matthews FMW:[id number] DOB: April 12, 1946 DOA: 09/25/2024     6 DOS: the patient was seen and examined on 10/01/2024   Brief hospital course: CC: abd pain, N/V HPI: Saben Donigan is a 78 y.o. male with medical history significant for hypertension, hyperlipidemia, CAD status post CABG, bioprosthetic AVR, TVR, atrial fibrillation and flutter on Eliquis , history of CVA, COPD, and BPH status post prostatectomy who presented on 09/25/2024 with acute onset upper abdominal pain, nausea, and vomiting.   Patient was in his usual state until the evening when he developed acute onset of back pain and initially which was followed closely by nausea with nonbloody vomiting and then pain in the upper abdomen.  He had not eaten anything for several hours prior to this and was unable to identify any alleviating or exacerbating factors.  Symptoms were similar to what he presented with in September 2025.  He is followed by surgery for symptomatic cholelithiasis but will require cardiac clearance prior to surgery.  Additionally, cardiology has advised that he does not come off of anticoagulation for at least a month following his recent cardioversion.   ED Course: Upon arrival to the ED, patient is found to be afebrile and saturating well on room air with normal RR, normal HR, and stable BP.  Labs are most notable for normal LFTs, normal lipase, and normal WBC.  CT demonstrates cholelithiasis with stable prominence of the CBD and increased inflammatory changes in the right upper quadrant.   Patient was started on Rocephin  and Flagyl in the ED.  Admission Imaging Studies: CT abd/pelvis Cholelithiasis. Prominence of the common bile duct is again identified and stable. Inflammatory changes in the right upper quadrant near the common bile duct and second portion of the duodenum. These have increased in the interval from the prior exam and again may represent acute cholecystitis although the possibility  of duodenitis deserves consideration as well. Diverticulosis without diverticulitis. Bladder wall thickening stable from the prior exam which may represent cystitis.  Assessment and Plan:  # Symptomatic cholelithiasis # Concern for contained perforation of gallbladder neck / cystic duct region - Patient presented with recurrent abdominal pain, nausea and vomiting, with no leukocytosis, normal LFTs, normal lipase. - CT abdomen pelvis showed cholelithiasis, stable CBD prominence, interval worsening of RUQ inflammatory changes near CBD and second portion of duodenum - RUQ US  showed cholelithiasis with mild gallbladder wall thickening, possible CBD dilation recommending MRCP - Underwent MRCP on 10/10 with findings concerning for probable small contained perforation of the gallbladder neck/cystic duct region with resulted mid complex collection - Surgery recommended IR placement of cholecystostomy tube in favor surgical invention with patient's need for continued anticoagulation per cardiology - IR stated no indication for cholecystostomy tube - ID following - recommending continuation of IV Zosyn  until final determination is made  - Continue IV Zosyn  - Continue Protonix  twice daily  #Atrial fibrillation and flutter - Status post cardioversion on 08/24/2024 - Cardiology stating imperative for patient to remain on anticoagulation for 1 month - Currently on heparin  drip, Eliquis  held  #Hypokalemia -- resolved  #HFpEF - Currently euvolemic and stable - Patient reports dramatic improvement in lower extremity edema over the last few days in comparison to previous weeks. - Continue home Lasix  40 mg daily  #CAD -Continue Crestor   #HTN -Continue Norvasc      Subjective: Patient seen in bed this morning with wife at bedside. Had a better day today, was able to eat half a tuna sandwich without  issue. No episodes of pain like last night. Denies any chest pain, nausea, vomiting, diarrhea, fevers,  chills.  Physical Exam: Vitals:   09/30/24 2019 10/01/24 0417 10/01/24 0834 10/01/24 1952  BP: 124/72 126/74 123/73 (!) 145/73  Pulse: 84 80 73 87  Resp: 17 17 18 16   Temp: 98.1 F (36.7 C) 98 F (36.7 C) 98 F (36.7 C) 98.4 F (36.9 C)  TempSrc: Oral Oral Oral Oral  SpO2: 93% 90% 93% 97%  Weight:      Height:       Physical Exam Constitutional:      General: He is not in acute distress.    Appearance: He is not ill-appearing.  HENT:     Mouth/Throat:     Mouth: Mucous membranes are moist.  Eyes:     Pupils: Pupils are equal, round, and reactive to light.  Cardiovascular:     Rate and Rhythm: Normal rate and regular rhythm.     Heart sounds: Normal heart sounds. No murmur heard. Pulmonary:     Effort: Pulmonary effort is normal. No respiratory distress.     Breath sounds: Normal breath sounds. No wheezing.  Abdominal:     General: Bowel sounds are normal. There is no distension.     Palpations: Abdomen is soft.     Tenderness: There is abdominal tenderness in the right upper quadrant. There is no guarding.  Musculoskeletal:     Right lower leg: No edema.     Left lower leg: No edema.  Skin:    General: Skin is warm and dry.     Capillary Refill: Capillary refill takes less than 2 seconds.  Neurological:     Mental Status: He is alert and oriented to person, place, and time. Mental status is at baseline.     Family Communication: Discussed with patient and his wife at bedside  Disposition: Status is: Inpatient Remains inpatient appropriate because: On IV antibiotics  Planned Discharge Destination: Home    Time spent: 35 minutes  Author: Jorell Agne Al-Sultani, MD 10/01/2024 10:08 PM  For on call review [email or web address].

## 2024-10-01 NOTE — Plan of Care (Signed)
   Problem: Education: Goal: Knowledge of the procedure and recovery process will improve Outcome: Progressing   Problem: Bowel/Gastric: Goal: Gastrointestinal status for postoperative course will improve Outcome: Progressing   Problem: Pain Management: Goal: General experience of comfort will improve Outcome: Progressing   Problem: Skin Integrity: Goal: Demonstration of wound healing without infection will improve Outcome: Progressing   Problem: Urinary Elimination: Goal: Ability to avoid or minimize complications of infection will improve Outcome: Progressing Goal: Ability to achieve and maintain urine output will improve Outcome: Progressing Goal: Home care management will improve Outcome: Progressing   Problem: Education: Goal: Knowledge of General Education information will improve Description: Including pain rating scale, medication(s)/side effects and non-pharmacologic comfort measures Outcome: Progressing   Problem: Health Behavior/Discharge Planning: Goal: Ability to manage health-related needs will improve Outcome: Progressing   Problem: Clinical Measurements: Goal: Ability to maintain clinical measurements within normal limits will improve Outcome: Progressing Goal: Will remain free from infection Outcome: Progressing Goal: Diagnostic test results will improve Outcome: Progressing Goal: Respiratory complications will improve Outcome: Progressing Goal: Cardiovascular complication will be avoided Outcome: Progressing   Problem: Activity: Goal: Risk for activity intolerance will decrease Outcome: Progressing   Problem: Nutrition: Goal: Adequate nutrition will be maintained Outcome: Progressing   Problem: Coping: Goal: Level of anxiety will decrease Outcome: Progressing   Problem: Elimination: Goal: Will not experience complications related to bowel motility Outcome: Progressing Goal: Will not experience complications related to urinary  retention Outcome: Progressing   Problem: Pain Managment: Goal: General experience of comfort will improve and/or be controlled Outcome: Progressing   Problem: Safety: Goal: Ability to remain free from injury will improve Outcome: Progressing   Problem: Skin Integrity: Goal: Risk for impaired skin integrity will decrease Outcome: Progressing

## 2024-10-01 NOTE — Consult Note (Signed)
 [facility]  Total days of antibiotics 6/piptazo       Reason for Consult: cholangitis    Referring Physician: al-sultani  Principal Problem:   Symptomatic cholelithiasis Active Problems:   Essential hypertension   Coronary artery disease   Chronic heart failure with preserved ejection fraction (HFpEF) (HCC)   Atrial fibrillation and flutter (HCC)   Perforation of gallbladder in cholecystitis    HPI: Kyle Matthews is a 78 y.o. male hx of AS s/p ARV and CABG c/b PVE and native TVE s/p redo with bentall procedure and TVR in July 2025 c/b CHB s/p PPM, atib s/p DCCV in sep 2025, on eliquis  x 4 wk ( 10/12/24) for now. He had episode of biliary colic earlier this year. He was seen by Dr Vernetta from generally surgery for evaluation for cholecystectomy. In the meantime, he was readmitted with acute abdominal pain after having meal, despite low fat diet modification. Labs showed he was afebrile, wbc of 9.2K but imaging on admit on 10/8 showing cholelithaisis with inflammatry changes to right upper quandrant near CBD, and 2nd portion of duodenum. C/w acute cholecystitits and possible duodenitis. Further evaluation by MRCP showing extrahepatic bile duct fluid collection, concerning for small contained perforation of the gallbladder neck/cystic duct region with resultant mild complex collection. Also there is multilobulated structure in the uncinate process of pancrease. Possible pseudocyst vs. IPMN. He was started on piptazo. Thus far, tolerating small amounts of food without aggravating abdominal pain.   Past Medical History:  Diagnosis Date   Anemia    Anxiety    Arthritis    BPH (benign prostatic hyperplasia)    Status post biopsy in 2009. - w/ LUTS   Coronary artery disease    a. 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   Severe aortic stenosis 04/13/2017   Stroke (HCC) 2007   No true etiology noted   UTI (urinary tract infection)     Allergies:  Allergies  Allergen Reactions   Tetanus Toxoid-Containing Vaccines Other (See Comments)    Childhood Allergy    Zetia  [Ezetimibe ] Diarrhea     MEDICATIONS:  amiodarone   200 mg Oral BID   amLODipine   5 mg Oral Daily   finasteride   5 mg Oral QPM   furosemide   40 mg Oral Daily   pantoprazole   40 mg Oral BID   rosuvastatin   40 mg Oral Daily   sodium chloride  flush  3 mL Intravenous Q12H    Social History   Tobacco Use   Smoking status: Every Day    Current packs/day: 0.00    Average packs/day: 1.5 packs/day for 24.0 years (36.0 ttl pk-yrs)    Types: Cigarettes    Last attempt to quit: 08/21/2023    Years since quitting: 1.1    Passive exposure: Past   Smokeless tobacco: Former   Tobacco comments:    Former smoker 07/17/24  Vaping Use   Vaping status: Never Used  Substance Use Topics   Alcohol use: Not Currently  Alcohol/week: 6.0 standard drinks of alcohol    Types: 6 Cans of beer per week   Drug use: No    Family History  Problem Relation Age of Onset   Coronary artery disease Mother 25       94. Was diagnosed with a blood clot - suspect that this was a STEMI   Lung cancer Father    Thyroid  disease Sister    Healthy Brother 3   Pancreatic cancer Sister 58   Cancer Maternal Grandmother    Heart disease Maternal Grandfather 64   Cancer Paternal Grandmother    Stroke Paternal Grandfather     Review of Systems - 12 point ros is negative, except for plaque like rash to face that started prior to admission,  non-pruritic  OBJECTIVE: Temp:  [98 F (36.7 C)-98.1 F (36.7 C)] 98 F (36.7 C) (10/14 0834) Pulse Rate:  [73-84] 73 (10/14 0834) Resp:  [17-18] 18 (10/14 0834) BP: (123-126)/(72-74) 123/73 (10/14 0834) SpO2:  [90 %-93 %] 93 % (10/14 0834) Physical Exam  Constitutional: He is oriented to person, place, and time. He appears well-developed and well-nourished. No distress.  HENT:  Mouth/Throat: Oropharynx is clear and moist. No oropharyngeal exudate.  Cardiovascular: Normal rate, regular rhythm and normal heart sounds. Exam reveals no gallop and no friction rub.  No murmur heard.  Pulmonary/Chest: Effort normal and breath sounds normal. No respiratory distress. He has no wheezes.  Abdominal: Soft. Bowel sounds are normal. He exhibits no distension. There is no tenderness.  Lymphadenopathy:  He has no cervical adenopathy.  Neurological: He is alert and oriented to person, place, and time.  Skin: Skin is warm and dry. No rash noted. No erythema.  Psychiatric: He has a normal mood and affect. His behavior is normal.   LABS: Results for orders placed or performed during the hospital encounter of 09/25/24 (from the past 48 hours)  CBC     Status: Abnormal   Collection Time: 09/30/24  5:59 AM  Result Value Ref Range   WBC 8.5 4.0 - 10.5 K/uL   RBC 4.19 (L) 4.22 - 5.81 MIL/uL   Hemoglobin 10.8 (L) 13.0 - 17.0 g/dL   HCT 65.5 (L) 60.9 - 47.9 %   MCV 82.1 80.0 - 100.0 fL   MCH 25.8 (L) 26.0 - 34.0 pg   MCHC 31.4 30.0 - 36.0 g/dL   RDW 84.2 (H) 88.4 - 84.4 %   Platelets 179 150 - 400 K/uL   nRBC 0.0 0.0 - 0.2 %    Comment: Performed at [facility], [address], [place], [place] [address]  APTT     Status: Abnormal   Collection Time: 09/30/24  5:59 AM  Result Value Ref Range   aPTT 84 (H) 24 - 36 seconds    Comment:        IF BASELINE aPTT IS ELEVATED, SUGGEST PATIENT RISK ASSESSMENT BE USED TO DETERMINE APPROPRIATE ANTICOAGULANT THERAPY. Performed at [facility], [address] [place], [place] [address]   Heparin  level (unfractionated)     Status: None   Collection Time: 09/30/24  5:59 AM  Result Value Ref Range   Heparin  Unfractionated 0.44 0.30 - 0.70 IU/mL    Comment: (NOTE) The clinical reportable range upper limit is being lowered to >1.10 to align with the FDA approved guidance for the current laboratory assay.  If heparin  results are below expected values, and patient dosage has  been confirmed, suggest follow up testing of  antithrombin III levels. Performed at [facility], [address] [place], [place] [address]   Basic metabolic panel with GFR     Status: Abnormal   Collection Time: 09/30/24  5:59 AM  Result Value Ref Range   Sodium 134 (L) 135 - 145 mmol/L   Potassium 3.6 3.5 - 5.1 mmol/L   Chloride 100 98 - 111 mmol/L   CO2 23 22 - 32 mmol/L   Glucose, Bld 112 (H) 70 - 99 mg/dL    Comment: Glucose reference range applies only to samples taken after fasting for at least 8 hours.   BUN 12 8 - 23 mg/dL   Creatinine, Ser 9.12 0.61 - 1.24 mg/dL   Calcium  8.5 (L) 8.9 - 10.3 mg/dL   GFR, Estimated >39 >39 mL/min    Comment: (NOTE) Calculated using the CKD-EPI Creatinine Equation (2021)    Anion gap 11 5 - 15    Comment: Performed at [facility], [address], [place], [place] [address]  Hepatic function panel     Status: Abnormal   Collection Time: 09/30/24 11:18 AM  Result Value Ref Range   Total Protein 6.6 6.5 - 8.1 g/dL   Albumin  2.7 (L) 3.5 - 5.0 g/dL   AST 15 15 - 41 U/L   ALT 11 0 - 44 U/L   Alkaline Phosphatase 79 38 - 126 U/L   Total Bilirubin 0.6 0.0 - 1.2 mg/dL   Bilirubin, Direct 0.1 0.0 - 0.2 mg/dL   Indirect Bilirubin 0.5 0.3 - 0.9 mg/dL    Comment: Performed at [facility], [address] [place], [place] [address]  CBC     Status: Abnormal   Collection Time: 10/01/24  4:01 AM  Result Value Ref Range   WBC 6.6 4.0 - 10.5 K/uL   RBC 3.90 (L) 4.22 - 5.81 MIL/uL    Hemoglobin 10.1 (L) 13.0 - 17.0 g/dL   HCT 67.6 (L) 60.9 - 47.9 %   MCV 82.8 80.0 - 100.0 fL   MCH 25.9 (L) 26.0 - 34.0 pg   MCHC 31.3 30.0 - 36.0 g/dL   RDW 84.3 (H) 88.4 - 84.4 %   Platelets 159 150 - 400 K/uL   nRBC 0.0 0.0 - 0.2 %    Comment: Performed at [facility], [address] [place], [place] [address]  Heparin  level (unfractionated)     Status: None   Collection Time: 10/01/24  4:01 AM  Result Value Ref Range   Heparin  Unfractionated 0.39 0.30 - 0.70 IU/mL    Comment: (NOTE) The clinical reportable range upper limit is being lowered to >1.10 to align with the FDA approved guidance for the current laboratory assay.  If heparin  results are below expected values, and patient dosage has  been confirmed, suggest follow up testing of antithrombin III levels. Performed at [facility], [address], [place], [place] [address]     MICRO: reviewed IMAGING: DG ABD ACUTE 2+V W 1V CHEST Result Date: 09/30/2024 CLINICAL DATA:  Abdominal pain EXAM: DG ABDOMEN ACUTE WITH 1 VIEW CHEST COMPARISON:  08/08/2019 FINDINGS: Cardiac shadow is stable. Postsurgical changes are seen. Pacing device is noted. Lungs are well aerated bilaterally. No focal infiltrate or effusion is seen. Scattered large and small bowel gas is noted. No free air is seen. Air-fluid levels are seen likely related to ileus. No definitive obstructive changes are seen. Changes of prior embolization are noted in the left hemipelvis. No  bony abnormality is noted. IMPRESSION: Findings suspicious for small bowel ileus. Electronically Signed   By: Oneil Devonshire M.D.   On: 09/30/2024 01:23   Assessment/Plan:   Cholecystitis, perforated gallbladder = continue with piptazo for now. I would recommend to proceed with plan to do cholecystectomy when clinically stable, with attempt for robotic approach, possible transition to open if needed per dr blackman's original assessment earlier in October.  Defer need for  further imaging to surgical team  - keeping on abtx, and gb drain would not be optimal as it would place him at risk for cdifficile from prolonged abtx, and want to avoid any risk for developing bacteremia that may then also place him at risk for PVE/PPM device infection  - will continue to monitor for side effects of abtx  Plaque like facial rash = does not appear to be antibiotic associated rash. Can try steroid cream short course to see if any improvement.  Afib = currently on heparin  in anticipation if needing surgery  evaluation of this patient requires complex antimicrobial therapy evaluation and counseling and isolation needs for disease transmission risk assessment and mitigation.

## 2024-10-01 NOTE — Progress Notes (Signed)
 PHARMACY - ANTICOAGULATION CONSULT NOTE  Pharmacy Consult for eliquis  >> heparin  Indication: atrial fibrillation  Allergies  Allergen Reactions   Tetanus Toxoid-Containing Vaccines Other (See Comments)    Childhood Allergy    Zetia  [Ezetimibe ] Diarrhea    Patient Measurements: Height: 5' 3 (160 cm) Weight: 70.3 kg (155 lb) IBW/kg (Calculated) : 56.9 HEPARIN  DW (KG): 70.3  Vital Signs: Temp: 98 F (36.7 C) (10/14 0417) Temp Source: Oral (10/14 0417) BP: 126/74 (10/14 0417) Pulse Rate: 80 (10/14 0417)  Labs: Recent Labs    09/28/24 0803 09/28/24 1206 09/28/24 2218 09/29/24 0426 09/30/24 0559 10/01/24 0401  HGB 10.8*  --   --   --  10.8* 10.1*  HCT 34.2*  --   --   --  34.4* 32.3*  PLT 150  --   --   --  179 159  APTT 82*   < > 90* 102* 84*  --   HEPARINUNFRC >1.10*  --   --  0.56 0.44 0.39  CREATININE 0.82  --   --  0.93 0.87  --    < > = values in this interval not displayed.    Estimated Creatinine Clearance: 63.7 mL/min (by C-G formula based on SCr of 0.87 mg/dL).  Assessment: 37 yoM presented with abdominal pain found to have cholecystitis vs duodenitis on CT. Pharmacy consulted to dose heparin  for atrial fibrillation pending surgery. PMH includes afib s/p cardioversion on  9/26 and on eliquis .  -Last dose of eliquis : 09/25/2024 @1000 , CBC WNL, underwent MRCP on 10/10 concerning for probable small contained perforation of the gallbladder  Heparin  level 0.39 is therapeutic. Hb 10.1, plt 159.   Goal of Therapy:  Heparin  level 0.3-0.7 units/ml Monitor platelets by anticoagulation protocol: Yes   Plan:  Continue heparin  infusion at 1150 units/hr Continue to monitor H&H and platelets F/u plans for possible cholecystostomy if patient clinical status worsens  Rankin Sams, PharmD, BCPS, BCCCP Clinical Pharmacist

## 2024-10-02 ENCOUNTER — Inpatient Hospital Stay (HOSPITAL_COMMUNITY)

## 2024-10-02 DIAGNOSIS — K8020 Calculus of gallbladder without cholecystitis without obstruction: Secondary | ICD-10-CM | POA: Diagnosis not present

## 2024-10-02 LAB — CBC
HCT: 34.3 % — ABNORMAL LOW (ref 39.0–52.0)
Hemoglobin: 10.9 g/dL — ABNORMAL LOW (ref 13.0–17.0)
MCH: 25.8 pg — ABNORMAL LOW (ref 26.0–34.0)
MCHC: 31.8 g/dL (ref 30.0–36.0)
MCV: 81.3 fL (ref 80.0–100.0)
Platelets: 192 K/uL (ref 150–400)
RBC: 4.22 MIL/uL (ref 4.22–5.81)
RDW: 15.5 % (ref 11.5–15.5)
WBC: 5.4 K/uL (ref 4.0–10.5)
nRBC: 0 % (ref 0.0–0.2)

## 2024-10-02 LAB — HEPARIN LEVEL (UNFRACTIONATED): Heparin Unfractionated: 0.36 [IU]/mL (ref 0.30–0.70)

## 2024-10-02 MED ORDER — IOHEXOL 350 MG/ML SOLN
75.0000 mL | Freq: Once | INTRAVENOUS | Status: AC | PRN
  Administered 2024-10-02: 75 mL via INTRAVENOUS

## 2024-10-02 NOTE — Progress Notes (Signed)
 Progress Note   Patient: Kyle Matthews FMW:[id number] DOB: 11-Oct-1946 DOA: 09/25/2024     7 DOS: the patient was seen and examined on 10/02/2024   Brief hospital course: CC: abd pain, N/V HPI: Kyle Matthews is a 78 y.o. male with medical history significant for hypertension, hyperlipidemia, CAD status post CABG, bioprosthetic AVR, TVR, atrial fibrillation and flutter on Eliquis , history of CVA, COPD, and BPH status post prostatectomy who presented on 09/25/2024 with acute onset upper abdominal pain, nausea, and vomiting.   Patient was in his usual state until the evening when he developed acute onset of back pain and initially which was followed closely by nausea with nonbloody vomiting and then pain in the upper abdomen.  He had not eaten anything for several hours prior to this and was unable to identify any alleviating or exacerbating factors.  Symptoms were similar to what he presented with in September 2025.  He is followed by surgery for symptomatic cholelithiasis but will require cardiac clearance prior to surgery.  Additionally, cardiology has advised that he does not come off of anticoagulation for at least a month following his recent cardioversion.   ED Course: Upon arrival to the ED, patient is found to be afebrile and saturating well on room air with normal RR, normal HR, and stable BP.  Labs are most notable for normal LFTs, normal lipase, and normal WBC.  CT demonstrates cholelithiasis with stable prominence of the CBD and increased inflammatory changes in the right upper quadrant.   Patient was started on Rocephin  and Flagyl in the ED.  Admission Imaging Studies: CT abd/pelvis Cholelithiasis. Prominence of the common bile duct is again identified and stable. Inflammatory changes in the right upper quadrant near the common bile duct and second portion of the duodenum. These have increased in the interval from the prior exam and again may represent acute cholecystitis although the possibility  of duodenitis deserves consideration as well. Diverticulosis without diverticulitis. Bladder wall thickening stable from the prior exam which may represent cystitis.  Assessment and Plan:  Symptomatic cholelithiasis Concern for contained perforation of gallbladder neck / cystic duct region - clinically undetermined - Patient presented with recurrent abdominal pain, nausea and vomiting, with no leukocytosis, normal LFTs, normal lipase. - CT abdomen pelvis showed cholelithiasis, stable CBD prominence, interval worsening of RUQ inflammatory changes near CBD and second portion of duodenum - RUQ US  showed cholelithiasis with mild gallbladder wall thickening, possible CBD dilation recommending MRCP - Underwent MRCP on 10/10 with findings concerning for probable small contained perforation of the gallbladder neck/cystic duct region with resulted mid complex collection - Surgery recommended IR placement of cholecystostomy tube in favor surgical invention with patient's need for continued anticoagulation per cardiology - IR stated no indication for cholecystostomy tube - ID following - recommending continuation of IV Zosyn  until final determination is made  - Continue IV Zosyn  - Continue Protonix  twice daily  Paroxysmal Atrial fibrillation and flutter - Status post cardioversion on 08/24/2024 - Cardiology stating imperative for patient to remain on anticoagulation for 1 month - Currently on heparin  drip, Eliquis  held  Hypokalemia -- resolved  Chronic HFpEF - Currently euvolemic and stable - Patient reports dramatic improvement in lower extremity edema over the last few days in comparison to previous weeks. - Continue home Lasix  40 mg daily  CAD -Continue Crestor   HTN -Continue Norvasc      Subjective: Pt seen with wife at bedside this AM. He denies significant abdominal pain, N/V or fevers/chills.  Tolerating diet, only eating  small amounts at a time.  Denies other acute  complaints.   Physical Exam: Vitals:   10/01/24 1952 10/02/24 0322 10/02/24 1006 10/02/24 1550  BP: (!) 145/73 119/71 124/80 123/71  Pulse: 87 82 77 74  Resp: 16 18 17 18   Temp: 98.4 F (36.9 C) 97.8 F (36.6 C) 98.5 F (36.9 C) 98.1 F (36.7 C)  TempSrc: Oral Oral Oral Oral  SpO2: 97% 95% 96% 96%  Weight:      Height:       General exam: awake, alert, no acute distress HEENT: moist mucus membranes, hearing grossly normal  Respiratory system: CTA, no wheezes, rales or rhonchi, normal respiratory effort. Cardiovascular system: normal S1/S2, RRR, no pedal edema.   Gastrointestinal system: soft, minimal tenderness without rebound or guarding, +bowel sounds. Central nervous system: A&O x 3. no gross focal neurologic deficits, normal speech Extremities: moves all, no edema, normal tone Skin: dry, intact, normal temperature Psychiatry: normal mood, congruent affect, judgement and insight appear normal   Family Communication: Wife at bedside on rounds today 10/15.   Disposition: Status is: Inpatient Remains inpatient appropriate because: On IV antibiotics and closely monitoring clinical course. D/C pending clearance by consultants including general surgery and infectious disease.    Planned Discharge Destination: Home    Time spent: 42 minutes  Author: Burnard DELENA Cunning, DO 10/02/2024 4:24 PM  For on call review [email or web address].

## 2024-10-02 NOTE — Progress Notes (Signed)
 PHARMACY - ANTICOAGULATION CONSULT NOTE  Pharmacy Consult for eliquis  >> heparin  Indication: atrial fibrillation  Allergies  Allergen Reactions   Tetanus Toxoid-Containing Vaccines Other (See Comments)    Childhood Allergy    Zetia  [Ezetimibe ] Diarrhea    Patient Measurements: Height: 5' 3 (160 cm) Weight: 70.3 kg (155 lb) IBW/kg (Calculated) : 56.9 HEPARIN  DW (KG): 70.3  Vital Signs: Temp: 97.8 F (36.6 C) (10/15 0322) Temp Source: Oral (10/15 0322) BP: 119/71 (10/15 0322) Pulse Rate: 82 (10/15 0322)  Labs: Recent Labs    09/30/24 0559 10/01/24 0401 10/02/24 0417  HGB 10.8* 10.1* 10.9*  HCT 34.4* 32.3* 34.3*  PLT 179 159 192  APTT 84*  --   --   HEPARINUNFRC 0.44 0.39 0.36  CREATININE 0.87  --   --     Estimated Creatinine Clearance: 63.7 mL/min (by C-G formula based on SCr of 0.87 mg/dL).  Assessment: 86 yoM presented with abdominal pain found to have cholecystitis vs duodenitis on CT. Pharmacy consulted to dose heparin  for atrial fibrillation pending surgery. PMH includes afib s/p cardioversion on  9/26 and on eliquis .  -Last dose of eliquis : 09/25/2024 @1000 , CBC WNL, underwent MRCP on 10/10 concerning for probable small contained perforation of the gallbladder  Heparin  level 0.36 is therapeutic (trending down on current dose 0.56, 0.44, 0.39, 0.36). Hb 10.9, plt 192.   Goal of Therapy:  Heparin  level 0.3-0.7 units/ml Monitor platelets by anticoagulation protocol: Yes   Plan:  Small increase in heparin  infusion to 1200 units/hr Continue to monitor H&H and platelets F/u plans for possible cholecystostomy if patient clinical status worsens  Rankin Sams, PharmD, BCPS, BCCCP Clinical Pharmacist

## 2024-10-02 NOTE — Progress Notes (Signed)
 Progress Note     Subjective: Pt reports overall pain is better but still having some RUQ abdominal pain. Eating bland foods only. Having bowel function.   Objective: Vital signs in last 24 hours: Temp:  [97.8 F (36.6 C)-98.5 F (36.9 C)] 98.5 F (36.9 C) (10/15 1006) Pulse Rate:  [77-87] 77 (10/15 1006) Resp:  [16-18] 17 (10/15 1006) BP: (119-145)/(71-80) 124/80 (10/15 1006) SpO2:  [95 %-97 %] 96 % (10/15 1006) Last BM Date : 09/30/24  Intake/Output from previous day: 10/14 0701 - 10/15 0700 In: 1777.9 [P.O.:600; I.V.:1075.2; IV Piggyback:102.7] Out: 1650 [Urine:1650] Intake/Output this shift: No intake/output data recorded.  PE: Gen:  Alert, NAD, pleasant Card:  Regular rate and rhythm Pulm:  Normal effort Abd: Soft, TTP RUQ and RLQ with voluntary guarding, no peritonitis - palpation of left hemi abdomen does not cause right-sided pain. Skin: warm and dry, no rashes  Psych: A&Ox3    Lab Results:  Recent Labs    10/01/24 0401 10/02/24 0417  WBC 6.6 5.4  HGB 10.1* 10.9*  HCT 32.3* 34.3*  PLT 159 192   BMET Recent Labs    09/30/24 0559  NA 134*  K 3.6  CL 100  CO2 23  GLUCOSE 112*  BUN 12  CREATININE 0.87  CALCIUM  8.5*   PT/INR No results for input(s): LABPROT, INR in the last 72 hours. CMP     Component Value Date/Time   NA 134 (L) 09/30/2024 0559   K 3.6 09/30/2024 0559   CL 100 09/30/2024 0559   CO2 23 09/30/2024 0559   GLUCOSE 112 (H) 09/30/2024 0559   BUN 12 09/30/2024 0559   CREATININE 0.87 09/30/2024 0559   CREATININE 0.91 09/11/2024 1353   CALCIUM  8.5 (L) 09/30/2024 0559   PROT 6.6 09/30/2024 1118   PROT 6.6 01/29/2024 1633   ALBUMIN  2.7 (L) 09/30/2024 1118   ALBUMIN  4.1 01/29/2024 1633   AST 15 09/30/2024 1118   ALT 11 09/30/2024 1118   ALKPHOS 79 09/30/2024 1118   BILITOT 0.6 09/30/2024 1118   BILITOT <0.2 01/29/2024 1633   GFRNONAA >60 09/30/2024 0559   Lipase     Component Value Date/Time   LIPASE 24 09/25/2024  2056       Studies/Results: No results found.  Anti-infectives: Anti-infectives (From admission, onward)    Start     Dose/Rate Route Frequency Ordered Stop   09/26/24 0600  piperacillin -tazobactam (ZOSYN ) IVPB 3.375 g        3.375 g 12.5 mL/hr over 240 Minutes Intravenous Every 8 hours 09/26/24 0207     09/26/24 0000  metroNIDAZOLE (FLAGYL) tablet 500 mg        500 mg Oral  Once 09/25/24 2348 09/26/24 0058   09/25/24 2345  cefTRIAXone  (ROCEPHIN ) 2 g in sodium chloride  0.9 % 100 mL IVPB        2 g 200 mL/hr over 30 Minutes Intravenous  Once 09/25/24 2341 09/26/24 0054        Assessment/Plan  28M with acute cholecystitis and possible gb perforation  - IR consulted for drainage but GB is decompressed and collections adjacent to gallbladder are not conclusive with cholecystitis they feel perc chole tube is not indicated. - patient is a high risk cardiac patient, underwent DCCV 9/26 and ideally would get 4 weeks of uninterrupted anticoagulation. If procedure is deemed urgent then could be switched to heparin  gtt. See cardiology note from 10/9.  - the patient is symptomatic at present with increasaed RUQ pain after  eating, severe pain requiring narcotics, and shallow respirations due to the pain. He is hemodynamically stable. Continue antibiotics. Failure to improve may warrant re-engaging IR for perc chole tube. Surgery would be higher risk for open cholecystectomy and/or subtotal.   - repeat CT AP today to reassess given ongoing pain   FEN: reg diet VTE: hep gtt ID: [id number]  10/9>>    LOS: 7 days   I reviewed Consultant ID notes, hospitalist notes, last 24 h vitals and pain scores, last 48 h intake and output, last 24 h labs and trends, and last 24 h imaging results.  This care required moderate level of medical decision making.    Burnard JONELLE Louder, PA-C  [facility] 10/02/2024, 11:00 AM Please see Amion for pager number during day hours 7:00am-4:30pm

## 2024-10-02 NOTE — Progress Notes (Signed)
 [facility] Progress Note     Subjective: CC:  Abdominal pain improving. Tolerating PO. Denies vomitring.  Objective: Vital signs in last 24 hours: Temp:  [97.8 F (36.6 C)-98.5 F (36.9 C)] 98.5 F (36.9 C) (10/15 1006) Pulse Rate:  [77-87] 77 (10/15 1006) Resp:  [16-18] 17 (10/15 1006) BP: (119-145)/(71-80) 124/80 (10/15 1006) SpO2:  [95 %-97 %] 96 % (10/15 1006) Last BM Date : 09/30/24  Intake/Output from previous day: 10/14 0701 - 10/15 0700 In: 1777.9 [P.O.:600; I.V.:1075.2; IV Piggyback:102.7] Out: 1650 [Urine:1650] Intake/Output this shift: No intake/output data recorded.  PE: Gen:  Alert, NAD, pleasant Card:  Regular rate and rhythm Pulm:  Normal effort, shallow breathing due to abdominal pain. Abd: Soft, TTP RUQ and RLQ with voluntary guarding, no peritonitis Skin: warm and dry, no rashes  Psych: A&Ox3   Lab Results:  Recent Labs    10/01/24 0401 10/02/24 0417  WBC 6.6 5.4  HGB 10.1* 10.9*  HCT 32.3* 34.3*  PLT 159 192   BMET Recent Labs    09/30/24 0559  NA 134*  K 3.6  CL 100  CO2 23  GLUCOSE 112*  BUN 12  CREATININE 0.87  CALCIUM  8.5*   PT/INR No results for input(s): LABPROT, INR in the last 72 hours. CMP     Component Value Date/Time   NA 134 (L) 09/30/2024 0559   K 3.6 09/30/2024 0559   CL 100 09/30/2024 0559   CO2 23 09/30/2024 0559   GLUCOSE 112 (H) 09/30/2024 0559   BUN 12 09/30/2024 0559   CREATININE 0.87 09/30/2024 0559   CREATININE 0.91 09/11/2024 1353   CALCIUM  8.5 (L) 09/30/2024 0559   PROT 6.6 09/30/2024 1118   PROT 6.6 01/29/2024 1633   ALBUMIN  2.7 (L) 09/30/2024 1118   ALBUMIN  4.1 01/29/2024 1633   AST 15 09/30/2024 1118   ALT 11 09/30/2024 1118   ALKPHOS 79 09/30/2024 1118   BILITOT 0.6 09/30/2024 1118   BILITOT <0.2 01/29/2024 1633   GFRNONAA >60 09/30/2024 0559   Lipase     Component Value Date/Time   LIPASE 24 09/25/2024 2056       Studies/Results: No results  found.   Anti-infectives: Anti-infectives (From admission, onward)    Start     Dose/Rate Route Frequency Ordered Stop   09/26/24 0600  piperacillin -tazobactam (ZOSYN ) IVPB 3.375 g        3.375 g 12.5 mL/hr over 240 Minutes Intravenous Every 8 hours 09/26/24 0207     09/26/24 0000  metroNIDAZOLE (FLAGYL) tablet 500 mg        500 mg Oral  Once 09/25/24 2348 09/26/24 0058   09/25/24 2345  cefTRIAXone  (ROCEPHIN ) 2 g in sodium chloride  0.9 % 100 mL IVPB        2 g 200 mL/hr over 30 Minutes Intravenous  Once 09/25/24 2341 09/26/24 0054        Assessment/Plan  10M with acute cholecystitis and possible gb perforation  - IR consulted for drainage but GB is decompressed and collections adjacent to gallbladder are not conclusive with cholecystitis they feel perc chole tube is not indicated. - patient is a high risk cardiac patient, underwent DCCV 9/26 and ideally would get 4 weeks of uninterrupted anticoagulation. If procedure is deemed urgent then could be switched to heparin  gtt. See cardiology note from 10/9.  - the patient is improving but persistently tender in RUQ, low threshold to repeat imaging.     LOS: 7 days   I reviewed nursing  notes, Consultant cardiology. IR notes, hospitalist notes, last 24 h vitals and pain scores, last 48 h intake and output, last 24 h labs and trends, and last 24 h imaging results.  This care required moderate level of medical decision making.   Almarie Pringle, PA-C [facility] Please see Amion for pager number during day hours 7:00am-4:30pm

## 2024-10-02 NOTE — Plan of Care (Signed)
   Problem: Education: Goal: Knowledge of the procedure and recovery process will improve Outcome: Progressing   Problem: Bowel/Gastric: Goal: Gastrointestinal status for postoperative course will improve Outcome: Progressing   Problem: Pain Management: Goal: General experience of comfort will improve Outcome: Progressing   Problem: Skin Integrity: Goal: Demonstration of wound healing without infection will improve Outcome: Progressing   Problem: Urinary Elimination: Goal: Ability to avoid or minimize complications of infection will improve Outcome: Progressing Goal: Ability to achieve and maintain urine output will improve Outcome: Progressing Goal: Home care management will improve Outcome: Progressing   Problem: Education: Goal: Knowledge of General Education information will improve Description: Including pain rating scale, medication(s)/side effects and non-pharmacologic comfort measures Outcome: Progressing   Problem: Health Behavior/Discharge Planning: Goal: Ability to manage health-related needs will improve Outcome: Progressing   Problem: Clinical Measurements: Goal: Ability to maintain clinical measurements within normal limits will improve Outcome: Progressing Goal: Will remain free from infection Outcome: Progressing Goal: Diagnostic test results will improve Outcome: Progressing Goal: Respiratory complications will improve Outcome: Progressing Goal: Cardiovascular complication will be avoided Outcome: Progressing   Problem: Activity: Goal: Risk for activity intolerance will decrease Outcome: Progressing   Problem: Nutrition: Goal: Adequate nutrition will be maintained Outcome: Progressing   Problem: Coping: Goal: Level of anxiety will decrease Outcome: Progressing   Problem: Elimination: Goal: Will not experience complications related to bowel motility Outcome: Progressing Goal: Will not experience complications related to urinary  retention Outcome: Progressing   Problem: Pain Managment: Goal: General experience of comfort will improve and/or be controlled Outcome: Progressing   Problem: Safety: Goal: Ability to remain free from injury will improve Outcome: Progressing   Problem: Skin Integrity: Goal: Risk for impaired skin integrity will decrease Outcome: Progressing

## 2024-10-03 DIAGNOSIS — K8020 Calculus of gallbladder without cholecystitis without obstruction: Secondary | ICD-10-CM | POA: Diagnosis not present

## 2024-10-03 DIAGNOSIS — Z952 Presence of prosthetic heart valve: Secondary | ICD-10-CM | POA: Diagnosis not present

## 2024-10-03 LAB — CBC
HCT: 36 % — ABNORMAL LOW (ref 39.0–52.0)
Hemoglobin: 11.5 g/dL — ABNORMAL LOW (ref 13.0–17.0)
MCH: 25.8 pg — ABNORMAL LOW (ref 26.0–34.0)
MCHC: 31.9 g/dL (ref 30.0–36.0)
MCV: 80.9 fL (ref 80.0–100.0)
Platelets: 235 K/uL (ref 150–400)
RBC: 4.45 MIL/uL (ref 4.22–5.81)
RDW: 15.4 % (ref 11.5–15.5)
WBC: 6.3 K/uL (ref 4.0–10.5)
nRBC: 0 % (ref 0.0–0.2)

## 2024-10-03 LAB — MAGNESIUM: Magnesium: 2.1 mg/dL (ref 1.7–2.4)

## 2024-10-03 LAB — COMPREHENSIVE METABOLIC PANEL WITH GFR
ALT: 18 U/L (ref 0–44)
AST: 27 U/L (ref 15–41)
Albumin: 2.6 g/dL — ABNORMAL LOW (ref 3.5–5.0)
Alkaline Phosphatase: 81 U/L (ref 38–126)
Anion gap: 12 (ref 5–15)
BUN: 7 mg/dL — ABNORMAL LOW (ref 8–23)
CO2: 26 mmol/L (ref 22–32)
Calcium: 8.7 mg/dL — ABNORMAL LOW (ref 8.9–10.3)
Chloride: 99 mmol/L (ref 98–111)
Creatinine, Ser: 0.81 mg/dL (ref 0.61–1.24)
GFR, Estimated: >60 mL/min (ref >60)
Glucose, Bld: 86 mg/dL (ref 70–99)
Potassium: 2.9 mmol/L — ABNORMAL LOW (ref 3.5–5.1)
Sodium: 137 mmol/L (ref 135–145)
Total Bilirubin: 0.8 mg/dL (ref 0.0–1.2)
Total Protein: 6.6 g/dL (ref 6.5–8.1)

## 2024-10-03 LAB — LIPASE, BLOOD: Lipase: 23 U/L (ref 11–51)

## 2024-10-03 LAB — AMYLASE: Amylase: 35 U/L (ref 28–100)

## 2024-10-03 LAB — HEPARIN LEVEL (UNFRACTIONATED): Heparin Unfractionated: 0.38 [IU]/mL (ref 0.30–0.70)

## 2024-10-03 MED ORDER — OXYCODONE HCL 5 MG PO TABS: 5.0000 mg | ORAL_TABLET | ORAL | 0 refills | Status: AC | PRN

## 2024-10-03 MED ORDER — POTASSIUM CHLORIDE CRYS ER 20 MEQ PO TBCR
40.0000 meq | EXTENDED_RELEASE_TABLET | ORAL | Status: DC
  Administered 2024-10-03: 40 meq via ORAL
  Filled 2024-10-03: qty 2

## 2024-10-03 MED ORDER — AMOXICILLIN-POT CLAVULANATE 875-125 MG PO TABS: 1.0000 | ORAL_TABLET | Freq: Two times a day (BID) | ORAL | 0 refills | Status: DC

## 2024-10-03 MED ORDER — DIPHENHYDRAMINE-ZINC ACETATE 2-0.1 % EX CREA
TOPICAL_CREAM | Freq: Two times a day (BID) | CUTANEOUS | Status: DC | PRN
  Filled 2024-10-03: qty 28

## 2024-10-03 MED ORDER — ONDANSETRON 4 MG PO TBDP: 4.0000 mg | ORAL_TABLET | Freq: Three times a day (TID) | ORAL | 0 refills | Status: AC | PRN

## 2024-10-03 MED ORDER — ACETAMINOPHEN 325 MG PO TABS: 650.0000 mg | ORAL_TABLET | Freq: Four times a day (QID) | ORAL | Status: AC | PRN

## 2024-10-03 NOTE — Progress Notes (Signed)
 Progress Note     Subjective: Pt reports tolerating diet, having bowel function. Some soreness but overall much improved. Took no doses of pain medication yesterday and only 1 dose the day prior.   Objective: Vital signs in last 24 hours: Temp:  [98 F (36.7 C)-98.8 F (37.1 C)] 98 F (36.7 C) (10/16 0755) Pulse Rate:  [74-89] 89 (10/16 0755) Resp:  [17-18] 18 (10/16 0755) BP: (121-130)/(71-83) 121/83 (10/16 0755) SpO2:  [95 %-97 %] 97 % (10/16 0755) Last BM Date : 09/30/24  Intake/Output from previous day: 10/15 0701 - 10/16 0700 In: 980 [P.O.:980] Out: 1100 [Urine:1100] Intake/Output this shift: No intake/output data recorded.  PE: Gen:  Alert, NAD, pleasant Card:  Regular rate and rhythm Pulm:  Normal effort Abd: Soft, TTP RUQ with some voluntary guarding, no peritonitis - palpation of left hemi abdomen does not cause right-sided pain. Skin: warm and dry, no rashes  Psych: A&Ox3    Lab Results:  Recent Labs    10/02/24 0417 10/03/24 0536  WBC 5.4 6.3  HGB 10.9* 11.5*  HCT 34.3* 36.0*  PLT 192 235   BMET Recent Labs    10/03/24 0536  NA 137  K 2.9*  CL 99  CO2 26  GLUCOSE 86  BUN 7*  CREATININE 0.81  CALCIUM  8.7*   PT/INR No results for input(s): LABPROT, INR in the last 72 hours. CMP     Component Value Date/Time   NA 137 10/03/2024 0536   K 2.9 (L) 10/03/2024 0536   CL 99 10/03/2024 0536   CO2 26 10/03/2024 0536   GLUCOSE 86 10/03/2024 0536   BUN 7 (L) 10/03/2024 0536   CREATININE 0.81 10/03/2024 0536   CREATININE 0.91 09/11/2024 1353   CALCIUM  8.7 (L) 10/03/2024 0536   PROT 6.6 10/03/2024 0536   PROT 6.6 01/29/2024 1633   ALBUMIN  2.6 (L) 10/03/2024 0536   ALBUMIN  4.1 01/29/2024 1633   AST 27 10/03/2024 0536   ALT 18 10/03/2024 0536   ALKPHOS 81 10/03/2024 0536   BILITOT 0.8 10/03/2024 0536   BILITOT <0.2 01/29/2024 1633   GFRNONAA >60 10/03/2024 0536   Lipase     Component Value Date/Time   LIPASE 23 10/03/2024 0536        Studies/Results: CT ABDOMEN PELVIS W CONTRAST Result Date: 10/02/2024 EXAM: CT ABDOMEN AND PELVIS WITH CONTRAST 10/02/2024 11:45:02 AM TECHNIQUE: CT of the abdomen and pelvis was performed with the administration of intravenous contrast, 75mL (iohexol  (OMNIPAQUE ) 350 MG/ML injection 75 mL IOHEXOL  350 MG/ML SOLN). Multiplanar reformatted images are provided for review. Automated exposure control, iterative reconstruction, and/or weight-based adjustment of the mA/kV was utilized to reduce the radiation dose to as low as reasonably achievable. COMPARISON: None available. CLINICAL HISTORY: Sepsis, perforated cholecystitis. FINDINGS: LOWER CHEST: Small right pleural effusion and trace left pleural effusion with passive atelectasis. Cardiomegaly noted with pacer leads observed, a right ventricular lead is not visible although there is a coronary sinus lead and an atrial lead. LIVER: The liver is unremarkable. Stable hepatic cyst. GALLBLADDER AND BILE DUCTS: Gallbladder is full of gallstones. Common bile duct 0.5 cm in diameter, within normal limits. SPLEEN: Punctate calcifications in the spleen are compatible with old granulomatous disease. PANCREAS: Small fluid density lesional on the upper margin of the pancreatic head, 2.2 x 1.1 cm, increased conspicuity compared to prior exams, suspicious for a small pseudocyst. A 2.7 x 1.1 by 0.8 cm peripancreatic fluid collection with enhancing margins could represent an early pseudocyst or a  small simple cyst. A 1.5 by 0.7 by 0.9 cm fluid collection along the medial margin of the pancreatic head medial to the distal common bile duct on image 48 series 4 likewise may represent a small pseudocyst or abscess. KIDNEYS, URETERS AND BLADDER: No stones in the kidneys or ureters. No hydronephrosis. 6.4x5.1 cm Bosniak category 2 cyst to the right kidney laterally has calcifications along its margins. Other Bosniak category 2 cysts are present bilaterally and were shown on  subtraction MRI imaging to not enhance compatible with benign Bosniak category 2 cysts. Small caliber thick-walled urinary bladder, cystitis not excluded. GI AND BOWEL: Sigmoid colon diverticulosis. Proximal appendix appears thickened at 9 mm in diameter on image 40 series 2 and could be inflamed. However, the distal appendiceal tip is gas-filled and does not appear thickened. Difficult to exclude early acute appendicitis. PERITONEUM AND RETROPERITONEUM: No ascites. No free air. VASCULATURE: Systemic atherosclerosis is present, including the aorta and iliac arteries. Substantial calcified and noncalcified plaque causing stenosis in the proximal superior mesenteric artery although without total occlusion. There is also atheromatous plaque proximally in the celiac trunk without occlusion. LYMPH NODES: Small retroperitoneal lymph nodes are likely reactive. REPRODUCTIVE ORGANS: No acute abnormality. 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 IMPRESSION: 1. Proximal appendiceal thickening up to 9 mm; early acute appendicitis is difficult to exclude. 2. Small fluid density lesion at the upper margin of the pancreatic head, suspicious for a small pseudocyst. 3. Peripancreatic fluid collection with enhancing margins near the porta hepatis, possibly early pseudocyst or small simple cyst. 4. Small fluid collection along the medial pancreatic head, possibly a small pseudocyst or abscess. 5. Gallbladder full of gallstones. 6. Small right and trace left pleural effusions with passive atelectasis. 7. Cardiomegaly with atrial and coronary sinus pacer leads; right ventricular lead not visualized. 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 8. 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 9. Small caliber thick-walled urinary  bladder; cystitis not excluded. 10. Sigmoid diverticulosis. Electronically signed by: Ryan Salvage MD 10/02/2024 03:48 PM EDT RP Workstation: [id number]    Anti-infectives: Anti-infectives (From admission, onward)    Start     Dose/Rate Route Frequency Ordered Stop   09/26/24 0600  piperacillin -tazobactam (ZOSYN ) IVPB 3.375 g        3.375 g 12.5 mL/hr over 240 Minutes Intravenous Every 8 hours 09/26/24 0207     09/26/24 0000  metroNIDAZOLE (FLAGYL) tablet 500 mg        500 mg Oral  Once 09/25/24 2348 09/26/24 0058   09/25/24 2345  cefTRIAXone  (ROCEPHIN ) 2 g in sodium chloride  0.9 % 100 mL IVPB        2 g 200 mL/hr over 30 Minutes Intravenous  Once 09/25/24 2341 09/26/24 0054        Assessment/Plan  87M with acute cholecystitis and possible gb perforation  - IR consulted for drainage but GB is decompressed and collections adjacent to gallbladder are not conclusive with cholecystitis they feel perc chole tube is not indicated. - patient is a high risk cardiac patient, underwent DCCV 9/26 and ideally would get 4 weeks of uninterrupted anticoagulation. If procedure is deemed urgent then could be switched to heparin  gtt. See cardiology note from 10/9.  - CT AP yesterday with gallbladder full of stones, question of early appendicitis question of pancreatitis - I suspect thickening of appendix is likely secondary  to gallbladder - amylase and lipase within normal limits - unfortunately I do not think perc chole drain is possible given small contracted gallbladder full of stones, surgery is not an option at the moment. May need to consider home on abx with plans for interval cholecystectomy when cleared from cardiology standpoint  - will discuss follow up with MD  FEN: reg diet VTE: hep gtt ID: [email or web address]  10/9>>    LOS: 8 days   I reviewed Consultant ID notes, hospitalist notes, last 24 h vitals and pain scores, last 48 h intake and output, last 24 h labs and trends, and last 24 h  imaging results.  This care required moderate level of medical decision making.    Burnard JONELLE Louder, [facility] 10/03/2024, 10:29 AM Please see Amion for pager number during day hours 7:00am-4:30pm

## 2024-10-03 NOTE — Discharge Summary (Addendum)
 Physician Discharge Summary   Patient: Kyle Matthews MRN: [id number] DOB: May 01, 1946  Admit date:     09/25/2024  Discharge date: 10/03/2024  Discharge Physician: Burnard DELENA Cunning   PCP: [name], DO   Recommendations at discharge:    Follow up with General Surgery as scheduled Follow up as scheduled with Cardiology Follow up with Primary Care in 1-2 weeks Repeat CBC, CMP, Mg at follow up  Discharge Diagnoses: Principal Problem:   Symptomatic cholelithiasis Active Problems:   Essential hypertension   Coronary artery disease   Chronic heart failure with preserved ejection fraction (HFpEF) (HCC)   Atrial fibrillation and flutter (HCC)   Perforation of gallbladder in cholecystitis  Resolved Problems:   * No resolved hospital problems. [facility] Course: CC: abd pain, N/V HPI: Kyle Matthews is a 78 y.o. male with medical history significant for hypertension, hyperlipidemia, CAD status post CABG, bioprosthetic AVR, TVR, atrial fibrillation and flutter on Eliquis , history of CVA, COPD, and BPH status post prostatectomy who presented on 09/25/2024 with acute onset upper abdominal pain, nausea, and vomiting.   Patient was in his usual state until the evening when he developed acute onset of back pain and initially which was followed closely by nausea with nonbloody vomiting and then pain in the upper abdomen.  He had not eaten anything for several hours prior to this and was unable to identify any alleviating or exacerbating factors.  Symptoms were similar to what he presented with in September 2025.  He is followed by surgery for symptomatic cholelithiasis but will require cardiac clearance prior to surgery.  Additionally, cardiology has advised that he does not come off of anticoagulation for at least a month following his recent cardioversion.   ED Course: Upon arrival to the ED, patient is found to be afebrile and saturating well on room air with normal RR, normal HR, and stable  BP.  Labs are most notable for normal LFTs, normal lipase, and normal WBC.  CT demonstrates cholelithiasis with stable prominence of the CBD and increased inflammatory changes in the right upper quadrant.   Patient was started on Rocephin  and Flagyl in the ED.  Admission Imaging Studies: CT abd/pelvis Cholelithiasis. Prominence of the common bile duct is again identified and stable. Inflammatory changes in the right upper quadrant near the common bile duct and second portion of the duodenum. These have increased in the interval from the prior exam and again may represent acute cholecystitis although the possibility of duodenitis deserves consideration as well. Diverticulosis without diverticulitis. Bladder wall thickening stable from the prior exam which may represent cystitis.   Assessment and Plan:  Symptomatic cholelithiasis Concern for contained perforation of gallbladder neck / cystic duct region - clinically undetermined - Patient presented with recurrent abdominal pain, nausea and vomiting, with no leukocytosis, normal LFTs, normal lipase. - CT abdomen pelvis showed cholelithiasis, stable CBD prominence, interval worsening of RUQ inflammatory changes near CBD and second portion of duodenum - RUQ US  showed cholelithiasis with mild gallbladder wall thickening, possible CBD dilation recommending MRCP - Underwent MRCP on 10/10 with findings concerning for probable small contained perforation of the gallbladder neck/cystic duct region with resulted mid complex collection - Surgery recommended IR placement of cholecystostomy tube in favor surgical invention with patient's need for continued anticoagulation per cardiology - IR stated no indication for cholecystostomy tube - ID was consulted - recommending continuation of IV Zosyn  until final determination is made  - D/C antibiotic per ID: PO Augmentin x 4 weeks -  Continue Protonix  daily  10/16 -- pt seen by surgery this AM and cleared for d/c  on PO antibiotics, given he is tolerating diet and symptoms improved.   --Close outpatient follow with General Surgery    Paroxysmal Atrial fibrillation and flutter - Status post cardioversion on 09/13/2024 - Cardiology consulted - 10/9 consult - Pt requires uninterrupted anticoagulation for 1 month  - Maintained on heparin  drip during admission - Resume Eliquis  on d/c   Hypokalemia -- resolved   Chronic HFpEF - Currently euvolemic and stable - Patient reports dramatic improvement in lower extremity edema over the last few days in comparison to previous weeks. - Continue home Lasix  40 mg daily   CAD -Continue Crestor    HTN -Continue Norvasc          Consultants: General Surgery, Infectious disease Procedures performed: None  Disposition: Home Diet recommendation:  Regular diet DISCHARGE MEDICATION: Allergies as of 10/03/2024       Reactions   Tetanus Toxoid-containing Vaccines Other (See Comments)   Childhood Allergy    Zetia  [ezetimibe ] Diarrhea        Medication List     TAKE these medications    acetaminophen  325 MG tablet Commonly known as: TYLENOL  Take 2 tablets (650 mg total) by mouth every 6 (six) hours as needed for mild pain (pain score 1-3) or fever (or Fever >/= 101).   albuterol  108 (90 Base) MCG/ACT inhaler Commonly known as: VENTOLIN  HFA Inhale 2 puffs into the lungs every 4 (four) hours as needed for shortness of breath or wheezing. ROUND THE CLOCK WHILE ON ANTIBIOTICS   amiodarone  200 MG tablet Commonly known as: PACERONE  Take 1 tablet (200 mg total) by mouth daily.   amLODipine  5 MG tablet Commonly known as: NORVASC  Take 0.5 tablets (2.5 mg total) by mouth daily.   amoxicillin-clavulanate 875-125 MG tablet Commonly known as: AUGMENTIN Take 1 tablet by mouth 2 (two) times daily for 28 days.   apixaban  5 MG Tabs tablet Commonly known as: ELIQUIS  Take 1 tablet (5 mg total) by mouth 2 (two) times daily.   finasteride  5 MG  tablet Commonly known as: PROSCAR  Take 5 mg by mouth every evening.   furosemide  40 MG tablet Commonly known as: LASIX  Take 1 tablet (40 mg total) by mouth daily.   nitroGLYCERIN  0.4 MG SL tablet Commonly known as: NITROSTAT  Place 1 tablet (0.4 mg total) under the tongue every 5 (five) minutes as needed for chest pain.   ondansetron  4 MG disintegrating tablet Commonly known as: ZOFRAN -ODT Take 1 tablet (4 mg total) by mouth every 8 (eight) hours as needed for nausea or vomiting.   oxyCODONE  5 MG immediate release tablet Commonly known as: Oxy IR/ROXICODONE  Take 1 tablet (5 mg total) by mouth every 4 (four) hours as needed for moderate pain (pain score 4-6).   pantoprazole  40 MG tablet Commonly known as: Protonix  Take 1 tablet (40 mg total) by mouth daily.   Potassium Chloride  ER 20 MEQ Tbcr Take 1 tablet by mouth daily.   rosuvastatin  10 MG tablet Commonly known as: CRESTOR  Take 1 tablet (10 mg total) by mouth daily. What changed: how much to take   senna-docusate 8.6-50 MG tablet Commonly known as: Senokot-S Take 2 tablets by mouth at bedtime.        Follow-up Information     Vernetta Berg, MD. Go on 10/29/2024.   Specialty: General Surgery Why: 1:40 PM, arrive 30 min prior to appointment time to check in. This appointment is to discuss  plans for possible interval cholecystectomy. Contact information: [address] [place] [address] [phone]                Discharge Exam: Fredricka Weights   09/25/24 2034  Weight: 70.3 kg   General exam: awake, alert, no acute distress HEENT: atraumatic, clear conjunctiva, anicteric sclera, =moist mucus membranes, hearing grossly normal  Respiratory system: CTAB=, no wheezes, rales or rhonchi, normal respiratory effort. Cardiovascular system: normal S1/S2, RRR, no JVD, murmurs, rubs, gallops, no pedal edema.   Gastrointestinal system: soft, NT, ND, no HSM felt, +bowel sounds. Central nervous  system: A&O x3. no gross focal neurologic deficits, normal speech Extremities: moves all, no edema, normal tone Skin: dry, intact, normal temperature, normal color, No rashes, lesions or ulcers Psychiatry: normal mood, congruent affect, judgement and insight appear normal   Condition at discharge: stable  The results of significant diagnostics from this hospitalization (including imaging, microbiology, ancillary and laboratory) are listed below for reference.   Imaging Studies: CT ABDOMEN PELVIS W CONTRAST Result Date: 10/02/2024 EXAM: CT ABDOMEN AND PELVIS WITH CONTRAST 10/02/2024 11:45:02 AM TECHNIQUE: CT of the abdomen and pelvis was performed with the administration of intravenous contrast, 75mL (iohexol  (OMNIPAQUE ) 350 MG/ML injection 75 mL IOHEXOL  350 MG/ML SOLN). Multiplanar reformatted images are provided for review. Automated exposure control, iterative reconstruction, and/or weight-based adjustment of the mA/kV was utilized to reduce the radiation dose to as low as reasonably achievable. COMPARISON: None available. CLINICAL HISTORY: Sepsis, perforated cholecystitis. FINDINGS: LOWER CHEST: Small right pleural effusion and trace left pleural effusion with passive atelectasis. Cardiomegaly noted with pacer leads observed, a right ventricular lead is not visible although there is a coronary sinus lead and an atrial lead. LIVER: The liver is unremarkable. Stable hepatic cyst. GALLBLADDER AND BILE DUCTS: Gallbladder is full of gallstones. Common bile duct 0.5 cm in diameter, within normal limits. SPLEEN: Punctate calcifications in the spleen are compatible with old granulomatous disease. PANCREAS: Small fluid density lesional on the upper margin of the pancreatic head, 2.2 x 1.1 cm, increased conspicuity compared to prior exams, suspicious for a small pseudocyst. A 2.7 x 1.1 by 0.8 cm peripancreatic fluid collection with enhancing margins could represent an early pseudocyst or a small simple cyst. A  1.5 by 0.7 by 0.9 cm fluid collection along the medial margin of the pancreatic head medial to the distal common bile duct on image 48 series 4 likewise may represent a small pseudocyst or abscess. KIDNEYS, URETERS AND BLADDER: No stones in the kidneys or ureters. No hydronephrosis. 6.4x5.1 cm Bosniak category 2 cyst to the right kidney laterally has calcifications along its margins. Other Bosniak category 2 cysts are present bilaterally and were shown on subtraction MRI imaging to not enhance compatible with benign Bosniak category 2 cysts. Small caliber thick-walled urinary bladder, cystitis not excluded. GI AND BOWEL: Sigmoid colon diverticulosis. Proximal appendix appears thickened at 9 mm in diameter on image 40 series 2 and could be inflamed. However, the distal appendiceal tip is gas-filled and does not appear thickened. Difficult to exclude early acute appendicitis. PERITONEUM AND RETROPERITONEUM: No ascites. No free air. VASCULATURE: Systemic atherosclerosis is present, including the aorta and iliac arteries. Substantial calcified and noncalcified plaque causing stenosis in the proximal superior mesenteric artery although without total occlusion. There is also atheromatous plaque proximally in the celiac trunk without occlusion. LYMPH NODES: Small retroperitoneal lymph nodes are likely reactive. REPRODUCTIVE ORGANS: No acute abnormality. BONES AND SOFT TISSUES: Prosthetic tricuspid valve noted. Prosthetic  aortic valve. Prior CABG. Degenerative facet arthropathy at L5-S1 with mild disc bulge. IMPRESSION: 1. Proximal appendiceal thickening up to 9 mm; early acute appendicitis is difficult to exclude. 2. Small fluid density lesion at the upper margin of the pancreatic head, suspicious for a small pseudocyst. 3. Peripancreatic fluid collection with enhancing margins near the porta hepatis, possibly early pseudocyst or small simple cyst. 4. Small fluid collection along the medial pancreatic head, possibly a  small pseudocyst or abscess. 5. Gallbladder full of gallstones. 6. Small right and trace left pleural effusions with passive atelectasis. 7. Cardiomegaly with atrial and coronary sinus pacer leads; right ventricular lead not visualized. 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 8. 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 9. Small caliber thick-walled urinary bladder; cystitis not excluded. 10. Sigmoid diverticulosis. Electronically signed by: Ryan Salvage MD 10/02/2024 03:48 PM EDT RP Workstation: [id number]   DG ABD ACUTE 2+V W 1V CHEST Result Date: 09/30/2024 CLINICAL DATA:  Abdominal pain EXAM: DG ABDOMEN ACUTE WITH 1 VIEW CHEST COMPARISON:  08/08/2019 FINDINGS: Cardiac shadow is stable. Postsurgical changes are seen. Pacing device is noted. Lungs are well aerated bilaterally. No focal infiltrate or effusion is seen. Scattered large and small bowel gas is noted. No free air is seen. Air-fluid levels are seen likely related to ileus. No definitive obstructive changes are seen. Changes of prior embolization are noted in the left hemipelvis. No bony abnormality is noted. IMPRESSION: Findings suspicious for small bowel ileus. Electronically Signed   By: Oneil Devonshire M.D.   On: 09/30/2024 01:23   MR ABDOMEN MRCP W WO CONTAST Result Date: 09/27/2024 CLINICAL DATA:  801171 Common bile duct dilatation 801171; 220388 Abnormal CT of the abdomen 220388 EXAM: MRI ABDOMEN WITHOUT AND WITH CONTRAST (INCLUDING MRCP) TECHNIQUE: Multiplanar multisequence MR imaging of the abdomen was performed both before and after the administration of intravenous contrast. Heavily T2-weighted images of the biliary and pancreatic ducts were obtained, and three-dimensional MRCP images were rendered by post processing. CONTRAST:  7mL GADAVIST GADOBUTROL 1 MMOL/ML IV SOLN COMPARISON:  Ultrasound abdomen from 09/26/2024 and CT scan abdomen  and pelvis from 09/25/2024. FINDINGS: Lower chest: Unremarkable MR appearance to the lung bases. No pleural effusion. No pericardial effusion. Normal heart size. Hepatobiliary: The liver is normal in size. Noncirrhotic configuration. There are 2 simple cysts in the liver with largest in the right hepatic lobe measuring up to 2.1 x 3.3 cm. No suspicious liver lesion. No intrahepatic bile duct dilation. The extrahepatic bile duct is slightly prominent measuring up to 8 mm in the proximal portion, 8 mm in the midportion and gradually tapers up to 6 mm just before the ampulla of Vater. The extrahepatic bile duct diameters are within normal limits for patient's age. No obstructing mass or choledocholithiasis. The gallbladder is physiologically distended and exhibit moderate-to-large amount of innumerable sub 5 mm gallstones. No abnormal gallbladder wall thickening. However, there are 3 adjacent mildly thick enhancing wall collections to the left of the gallbladder and posterior to the extrahepatic bile duct with largest collection measuring up to 1.6 x 2.7 cm (series 34, images 41-46). These collections are grossly similar to the prior study from 09/25/2024 but new since the prior study from 08/20/2024. Collections are adjacent to/inseparable from the gallbladder neck/cystic duct. There is also fat stranding in the right upper quadrant centered around this region. There is apparent transient hepatic intensity difference in the surrounding liver parenchyma (series 21, images 41-45). The  duodenal, pancreatic head/uncinate process, right adrenal and right kidney appears within normal limits and likely not the source of inflammation. These constellation of findings raises the concern for probable small contained perforation of the gallbladder neck/cystic duct region. Correlate clinically. Pancreas: Normal T1 hyperintense appearance of the pancreas. There is a 1.1 x 1.2 cm multilobulated T2 hyperintense, nonenhancing  structure in the uncinate process. There is an additional, 6 x 6 mm similar characteristic structure in the pancreas head/uncinate junction region (series 4, image 20). No direct communication of the structures noted with the pancreatic side branch or main duct. Differential diagnosis includes pancreatic side branch IPMN or pseudocyst. Attention on follow-up examination is recommended. The pancreas is otherwise unremarkable. Main pancreatic duct is not dilated. No peripancreatic fat stranding. Spleen:  Within normal limits in size and appearance. No focal mass. Adrenals/Urinary Tract: Unremarkable adrenal glands. No hydroureteronephrosis on either side. There are multiple bilateral renal cysts with largest arising from the right kidney upper pole measuring up to 5.2 x 6.2 cm. There are also multiple structures with varying amount of T1 hyperintense components with largest T1 hyperintense structure arising from the left kidney interpolar region, anteriorly measuring up to 1.4 x 2.0 cm, which can be characterized as a proteinaceous/hemorrhagic cysts. The other structures with T1 hyperintense contents and lack of enhancement also favor small quantity of dependent proteinaceous/hemorrhagic contents. No suspicious renal mass. Stomach/Bowel: Visualized portions within the abdomen are unremarkable. No disproportionate dilation of bowel loops. Vascular/Lymphatic: No pathologically enlarged lymph nodes identified. No abdominal aortic aneurysm demonstrated. There is trace amount of ascites along the right posteroinferior liver surface Other:  None. Musculoskeletal: No suspicious bone lesions identified. IMPRESSION: 1. There are 3 adjacent mildly thick enhancing walled collections to the left of the gallbladder and posterior to the extrahepatic bile duct with largest collection measuring up to 1.6 x 2.7 cm. These collections are adjacent to/inseparable from the gallbladder neck/cystic duct. There is also fat stranding in the  right upper quadrant centered around this region. There is apparent transient hepatic intensity difference in the surrounding liver parenchyma. The duodenum, pancreatic head/uncinate process, right adrenal and right kidney appears within normal limits and likely not the source of inflammation. These constellation of findings raises the concern for probable small contained perforation of the gallbladder neck/cystic duct region with resultant mild complex collection. Correlate clinically. 2. There is a 1.1 x 1.2 cm multilobulated T2 hyperintense, nonenhancing structure in the uncinate process. There is an additional, 6 x 6 mm similar characteristic structure in the pancreas head/uncinate junction region. No direct communication of the structures noted with the pancreatic side branch or main duct. Differential diagnosis includes pancreatic side branch IPMN or pseudocyst. Attention on follow-up examination is recommended. 3. Multiple bilateral renal simple renal cysts as well as cysts with proteinaceous/hemorrhagic contents. No suspicious renal mass. 4. Multiple other nonacute observations, as described above. Electronically Signed   By: Ree Molt M.D.   On: 09/27/2024 12:17   MR 3D Recon At Scanner Result Date: 09/27/2024 CLINICAL DATA:  801171 Common bile duct dilatation 801171; 220388 Abnormal CT of the abdomen 220388 EXAM: MRI ABDOMEN WITHOUT AND WITH CONTRAST (INCLUDING MRCP) TECHNIQUE: Multiplanar multisequence MR imaging of the abdomen was performed both before and after the administration of intravenous contrast. Heavily T2-weighted images of the biliary and pancreatic ducts were obtained, and three-dimensional MRCP images were rendered by post processing. CONTRAST:  7mL GADAVIST GADOBUTROL 1 MMOL/ML IV SOLN COMPARISON:  Ultrasound abdomen from 09/26/2024 and CT scan abdomen and  pelvis from 09/25/2024. FINDINGS: Lower chest: Unremarkable MR appearance to the lung bases. No pleural effusion. No  pericardial effusion. Normal heart size. Hepatobiliary: The liver is normal in size. Noncirrhotic configuration. There are 2 simple cysts in the liver with largest in the right hepatic lobe measuring up to 2.1 x 3.3 cm. No suspicious liver lesion. No intrahepatic bile duct dilation. The extrahepatic bile duct is slightly prominent measuring up to 8 mm in the proximal portion, 8 mm in the midportion and gradually tapers up to 6 mm just before the ampulla of Vater. The extrahepatic bile duct diameters are within normal limits for patient's age. No obstructing mass or choledocholithiasis. The gallbladder is physiologically distended and exhibit moderate-to-large amount of innumerable sub 5 mm gallstones. No abnormal gallbladder wall thickening. However, there are 3 adjacent mildly thick enhancing wall collections to the left of the gallbladder and posterior to the extrahepatic bile duct with largest collection measuring up to 1.6 x 2.7 cm (series 34, images 41-46). These collections are grossly similar to the prior study from 09/25/2024 but new since the prior study from 08/20/2024. Collections are adjacent to/inseparable from the gallbladder neck/cystic duct. There is also fat stranding in the right upper quadrant centered around this region. There is apparent transient hepatic intensity difference in the surrounding liver parenchyma (series 21, images 41-45). The duodenal, pancreatic head/uncinate process, right adrenal and right kidney appears within normal limits and likely not the source of inflammation. These constellation of findings raises the concern for probable small contained perforation of the gallbladder neck/cystic duct region. Correlate clinically. Pancreas: Normal T1 hyperintense appearance of the pancreas. There is a 1.1 x 1.2 cm multilobulated T2 hyperintense, nonenhancing structure in the uncinate process. There is an additional, 6 x 6 mm similar characteristic structure in the pancreas  head/uncinate junction region (series 4, image 20). No direct communication of the structures noted with the pancreatic side branch or main duct. Differential diagnosis includes pancreatic side branch IPMN or pseudocyst. Attention on follow-up examination is recommended. The pancreas is otherwise unremarkable. Main pancreatic duct is not dilated. No peripancreatic fat stranding. Spleen:  Within normal limits in size and appearance. No focal mass. Adrenals/Urinary Tract: Unremarkable adrenal glands. No hydroureteronephrosis on either side. There are multiple bilateral renal cysts with largest arising from the right kidney upper pole measuring up to 5.2 x 6.2 cm. There are also multiple structures with varying amount of T1 hyperintense components with largest T1 hyperintense structure arising from the left kidney interpolar region, anteriorly measuring up to 1.4 x 2.0 cm, which can be characterized as a proteinaceous/hemorrhagic cysts. The other structures with T1 hyperintense contents and lack of enhancement also favor small quantity of dependent proteinaceous/hemorrhagic contents. No suspicious renal mass. Stomach/Bowel: Visualized portions within the abdomen are unremarkable. No disproportionate dilation of bowel loops. Vascular/Lymphatic: No pathologically enlarged lymph nodes identified. No abdominal aortic aneurysm demonstrated. There is trace amount of ascites along the right posteroinferior liver surface Other:  None. Musculoskeletal: No suspicious bone lesions identified. IMPRESSION: 1. There are 3 adjacent mildly thick enhancing walled collections to the left of the gallbladder and posterior to the extrahepatic bile duct with largest collection measuring up to 1.6 x 2.7 cm. These collections are adjacent to/inseparable from the gallbladder neck/cystic duct. There is also fat stranding in the right upper quadrant centered around this region. There is apparent transient hepatic intensity difference in the  surrounding liver parenchyma. The duodenum, pancreatic head/uncinate process, right adrenal and right kidney appears within normal limits  and likely not the source of inflammation. These constellation of findings raises the concern for probable small contained perforation of the gallbladder neck/cystic duct region with resultant mild complex collection. Correlate clinically. 2. There is a 1.1 x 1.2 cm multilobulated T2 hyperintense, nonenhancing structure in the uncinate process. There is an additional, 6 x 6 mm similar characteristic structure in the pancreas head/uncinate junction region. No direct communication of the structures noted with the pancreatic side branch or main duct. Differential diagnosis includes pancreatic side branch IPMN or pseudocyst. Attention on follow-up examination is recommended. 3. Multiple bilateral renal simple renal cysts as well as cysts with proteinaceous/hemorrhagic contents. No suspicious renal mass. 4. Multiple other nonacute observations, as described above. Electronically Signed   By: Ree Molt M.D.   On: 09/27/2024 12:17   US  Abdomen Limited RUQ (LIVER/GB) Result Date: 09/26/2024 EXAM: Right Upper Quadrant Abdominal Ultrasound TECHNIQUE: Real-time ultrasonography of the right upper quadrant of the abdomen was performed. COMPARISON: None. CLINICAL HISTORY: Symptomatic cholelithiasis. FINDINGS: LIVER: The liver demonstrates normal echogenicity. No intrahepatic biliary ductal dilatation. No mass. A 3.1 x 2.6 x 2.9 cm lesion is present in the right hepatic lobe, demonstrating fluid echogenicity, which may represent 2 adjacent cysts or a cystic lesion with a small single internal septation, such as cystadenoma. BILIARY SYSTEM: Numerous layering gallstones are filling much of the gallbladder. Mild gallbladder wall thickening is present, measuring 0.3 cm. The common bile duct measures 0.4 cm proximally and may be dilated distally, although this appearance of dilation distally  may be due to the confluence of a low-attaching cystic duct with the common hepatic duct; this could be better characterized at MRI if clinically warranted. OTHER: No right upper quadrant ascites. IMPRESSION: 1. Cholelithiasis with mild gallbladder wall thickening. 2. Possible distal common bile duct dilatation; consider MRCP for further evaluation if clinically warranted. 3. 3.1 x 2.6 x 2.9 cm right hepatic lobe cystic lesion, which may represent adjacent simple cysts versus a septated cystic lesion such as cystadenoma. Electronically signed by: Ryan Salvage MD 09/26/2024 10:18 AM EDT RP Workstation: [id number]   CT ABDOMEN PELVIS W CONTRAST Result Date: 09/25/2024 CLINICAL DATA:  Acute onset upper abdominal pain, initial encounter EXAM: CT ABDOMEN AND PELVIS WITH CONTRAST TECHNIQUE: Multidetector CT imaging of the abdomen and pelvis was performed using the standard protocol following bolus administration of intravenous contrast. RADIATION DOSE REDUCTION: This exam was performed according to the departmental dose-optimization program which includes automated exposure control, adjustment of the mA and/or kV according to patient size and/or use of iterative reconstruction technique. CONTRAST:  OMNIPAQUE  IOHEXOL  300 MG/ML  SOLN COMPARISON:  08/21/2024 ultrasound, CT from 08/20/2024 FINDINGS: Lower chest: No acute abnormality. Hepatobiliary: Scattered cysts are noted within the liver. The gallbladder is well distended with multiple small gallstones. Persistent inflammatory changes noted surrounding the common bile duct and central gallbladder as well as the second portion of the duodenum. This is increased when compared with the prior exam. Pancreas: Pancreas is within normal limits. Spleen: Normal in size without focal abnormality. Calcified granulomas are again seen. Adrenals/Urinary Tract: Adrenal glands are within normal limits. Partially calcified cysts are noted within the kidneys bilaterally stable  from the prior exam. No follow-up is recommended. No renal calculi are seen. No obstructive changes are noted. The bladder is partially distended. Some wall thickening of the bladder is noted stable from the prior exam. Stomach/Bowel: Scattered diverticular change of the colon is noted without evidence of diverticulitis. The appendix is within normal limits. Stomach  is unremarkable. Inflammatory changes surrounding the second portion of the duodenum are seen. Vascular/Lymphatic: Aortic atherosclerosis. No enlarged abdominal or pelvic lymph nodes. Reproductive: Prostate is unremarkable. Other: No abdominal wall hernia or abnormality. No abdominopelvic ascites. Musculoskeletal: No acute or significant osseous findings. IMPRESSION: Cholelithiasis. Prominence of the common bile duct is again identified and stable. Inflammatory changes in the right upper quadrant near the common bile duct and second portion of the duodenum. These have increased in the interval from the prior exam and again may represent acute cholecystitis although the possibility of duodenitis deserves consideration as well. Diverticulosis without diverticulitis. Bladder wall thickening stable from the prior exam which may represent cystitis. Electronically Signed   By: Oneil Devonshire M.D.   On: 09/25/2024 23:30   EP STUDY Result Date: 09/13/2024 See surgical note for result.   Microbiology: Results for orders placed or performed in visit on 09/11/24  Culture, blood (single) w Reflex to ID Panel     Status: None   Collection Time: 09/11/24  1:53 PM   Specimen: Blood  Result Value Ref Range Status   MICRO NUMBER: [id number]  Final   SPECIMEN QUALITY: Adequate  Final   Source BLOOD 1  Final   STATUS: FINAL  Final   Result: No growth after 5 days  Final   COMMENT: Aerobic and anaerobic bottle received.  Final  Culture, blood (single) w Reflex to ID Panel     Status: None   Collection Time: 09/11/24  1:58 PM   Specimen: Blood  Result Value  Ref Range Status   MICRO NUMBER: [id number]  Final   SPECIMEN QUALITY: Adequate  Final   Source BLOOD 2  Final   STATUS: FINAL  Final   Result: No growth after 5 days  Final   COMMENT: Aerobic and anaerobic bottle received.  Final    Labs: CBC: Recent Labs  Lab 09/28/24 0803 09/30/24 0559 10/01/24 0401 10/02/24 0417 10/03/24 0536  WBC 8.6 8.5 6.6 5.4 6.3  HGB 10.8* 10.8* 10.1* 10.9* 11.5*  HCT 34.2* 34.4* 32.3* 34.3* 36.0*  MCV 83.2 82.1 82.8 81.3 80.9  PLT 150 179 159 192 235   Basic Metabolic Panel: Recent Labs  Lab 09/27/24 0431 09/28/24 0803 09/29/24 0426 09/30/24 0559 10/03/24 0536  NA 135 132* 134* 134* 137  K 3.5 3.2* 2.9* 3.6 2.9*  CL 102 98 98 100 99  CO2 23 21* 26 23 26   GLUCOSE 87 89 93 112* 86  BUN 14 12 9 12  7*  CREATININE 0.89 0.82 0.93 0.87 0.81  CALCIUM  8.9 8.2* 8.3* 8.5* 8.7*  MG  --   --   --   --  2.1   Liver Function Tests: Recent Labs  Lab 09/27/24 0431 09/28/24 0803 09/29/24 0426 09/30/24 1118 10/03/24 0536  AST 17 14* 13* 15 27  ALT 10 11 11 11 18   ALKPHOS 86 70 67 79 81  BILITOT 0.4 0.8 0.9 0.6 0.8  PROT 6.8 6.4* 6.1* 6.6 6.6  ALBUMIN  3.8 2.8* 2.6* 2.7* 2.6*   CBG: No results for input(s): GLUCAP in the last 168 hours.  Discharge time spent: less than 30 minutes.  Signed: Burnard DELENA Cunning, DO [facility] 10/03/2024

## 2024-10-03 NOTE — Progress Notes (Signed)
 [facility]  Date of Admission:  09/25/2024     Reason for Follow Up: Symptomatic cholelithiasis  Total days of antibiotics 8         ASSESSMENT:  Kyle Matthews is a 78 y/o Caucasian male admitted with acute abdominal pain and found to have acute cholecystitis and concern for small contain perforation of the gallbladder. Unable to come off Eliquis  secondary to recent cardiac procedure and IR evaluated with no indication for cholecystostomy placement. Started on piperacillin -tazobactam with plan for antibiotic treatment.   Mr. Guercio is tolerating piperacillin -tazobactam with no adverse side effects.  Facial rash does not appear consistent with drug related rash and Primary Team has prescribed Benadryl  cream. General Surgery anticipating treatment with antibiotics and plan for interval cholecystectomy when able. Discussed plan of care to continue with current dose of piperacillin -tazobactam and likely need for suppression until surgery given increased risk for bacteremia in the setting of prosthetic heart valve and PPM. Remains stable. Continue standard/universal precautions. Remaining medical and supportive care per Internal Medicine.   PLAN:  Continue current dose of piperacillin -tazobactam.  Standard/universal precautions.  Remaining medical and supportive care per Internal Medicine.   Principal Problem:   Symptomatic cholelithiasis Active Problems:   Essential hypertension   Coronary artery disease   Chronic heart failure with preserved ejection fraction (HFpEF) (HCC)   Atrial fibrillation and flutter (HCC)   Perforation of gallbladder in cholecystitis    amiodarone   200 mg Oral BID   amLODipine   5 mg Oral Daily   finasteride   5 mg Oral QPM   furosemide   40 mg Oral Daily   pantoprazole   40 mg Oral BID   potassium chloride   40 mEq Oral Q4H   rosuvastatin   40 mg Oral Daily   sodium chloride  flush  3 mL Intravenous Q12H    SUBJECTIVE:  Afebrile  overnight with no acute events. Tolerating antibiotics with no adverse side effects. Appetite is improving slowly. Waxing and waning rash on face and has requested benadryl  cream.   Allergies  Allergen Reactions   Tetanus Toxoid-Containing Vaccines Other (See Comments)    Childhood Allergy    Zetia  [Ezetimibe ] Diarrhea     Review of Systems: Review of Systems  Constitutional:  Negative for chills, fever and weight loss.  Respiratory:  Negative for cough, shortness of breath and wheezing.   Cardiovascular:  Negative for chest pain and leg swelling.  Gastrointestinal:  Negative for abdominal pain, constipation, diarrhea, nausea and vomiting.  Skin:  Positive for rash.      OBJECTIVE: Vitals:   10/02/24 1006 10/02/24 1550 10/02/24 2100 10/03/24 0755  BP: 124/80 123/71 130/76 121/83  Pulse: 77 74 82 89  Resp: 17 18 17 18   Temp: 98.5 F (36.9 C) 98.1 F (36.7 C) 98.8 F (37.1 C) 98 F (36.7 C)  TempSrc: Oral Oral Oral Oral  SpO2: 96% 96% 95% 97%  Weight:      Height:       Body mass index is 27.46 kg/m.  Physical Exam Constitutional:      General: He is not in acute distress.    Appearance: He is well-developed.  Cardiovascular:     Rate and Rhythm: Normal rate and regular rhythm.     Heart sounds: Normal heart sounds.  Pulmonary:     Effort: Pulmonary effort is normal.     Breath sounds: Normal breath sounds.  Skin:    General: Skin is warm and dry.  Neurological:  Mental Status: He is alert.     Lab Results Lab Results  Component Value Date   WBC 6.3 10/03/2024   HGB 11.5 (L) 10/03/2024   HCT 36.0 (L) 10/03/2024   MCV 80.9 10/03/2024   PLT 235 10/03/2024    Lab Results  Component Value Date   CREATININE 0.81 10/03/2024   BUN 7 (L) 10/03/2024   NA 137 10/03/2024   K 2.9 (L) 10/03/2024   CL 99 10/03/2024   CO2 26 10/03/2024    Lab Results  Component Value Date   ALT 18 10/03/2024   AST 27 10/03/2024   ALKPHOS 81 10/03/2024   BILITOT 0.8  10/03/2024     Microbiology: No results found for this or any previous visit (from the past 240 hours).  I have personally spent 26 minutes involved in face-to-face and non-face-to-face activities for this patient on the day of the visit. Professional time spent includes the following activities: preparing to see the patient (review of tests), performing a medically appropriate examination, ordering medications, communicating with other health care professionals, documenting clinical information in the EMR, communicating results and counseling patient regarding medication and plan of care, and care coordination.   Greg Shaquaya Wuellner, NP [facility]  10/03/2024  1:04 PM

## 2024-10-03 NOTE — Progress Notes (Signed)
 Reviewed AVS, patient expressed understanding of medications, MD follow up reviewed.   Removed IV, Site clean, dry and intact.  See LDA for information on wounds at discharge. Patient states all belongings brought to the hospital at time of admission are accounted for and packed to take home.  Patient informed and expressed understanding where to pick up discharge medications.  [facility] contacted to transport patient to entrance A, where family member was waiting in vehicle to transport home.

## 2024-10-03 NOTE — Progress Notes (Signed)
 PHARMACY - ANTICOAGULATION CONSULT NOTE  Pharmacy Consult for eliquis  >> heparin  Indication: atrial fibrillation  Allergies  Allergen Reactions   Tetanus Toxoid-Containing Vaccines Other (See Comments)    Childhood Allergy    Zetia  [Ezetimibe ] Diarrhea    Patient Measurements: Height: 5' 3 (160 cm) Weight: 70.3 kg (155 lb) IBW/kg (Calculated) : 56.9 HEPARIN  DW (KG): 70.3  Vital Signs: Temp: 98.8 F (37.1 C) (10/15 2100) Temp Source: Oral (10/15 2100) BP: 130/76 (10/15 2100) Pulse Rate: 82 (10/15 2100)  Labs: Recent Labs    10/01/24 0401 10/02/24 0417 10/03/24 0536  HGB 10.1* 10.9* 11.5*  HCT 32.3* 34.3* 36.0*  PLT 159 192 235  HEPARINUNFRC 0.39 0.36 0.38  CREATININE  --   --  0.81    Estimated Creatinine Clearance: 68.4 mL/min (by C-G formula based on SCr of 0.81 mg/dL).  Assessment: Kyle Matthews presented with abdominal pain found to have cholecystitis vs duodenitis on CT. Pharmacy consulted to dose heparin  for atrial fibrillation pending surgery. PMH includes afib s/p cardioversion on  9/26 and on eliquis .  -Last dose of eliquis : 09/25/2024 @1000 , CBC WNL, underwent MRCP on 10/10 concerning for probable small contained perforation of the gallbladder  Heparin  level 0.38 is therapeutic (trending 0.56, 0.44, 0.39, 0.36, 0.38). Hb 11.5, plt 235.   Goal of Therapy:  Heparin  level 0.3-0.7 units/ml Monitor platelets by anticoagulation protocol: Yes   Plan:  Small increase in heparin  infusion to 1250 units/hr Continue to monitor H&H and platelets F/u plans for possible cholecystostomy if patient clinical status worsens  Rankin Sams, PharmD, BCPS, BCCCP Clinical Pharmacist

## 2024-10-03 NOTE — Plan of Care (Signed)
   Problem: Education: Goal: Knowledge of the procedure and recovery process will improve Outcome: Progressing   Problem: Bowel/Gastric: Goal: Gastrointestinal status for postoperative course will improve Outcome: Progressing   Problem: Pain Management: Goal: General experience of comfort will improve Outcome: Progressing   Problem: Skin Integrity: Goal: Demonstration of wound healing without infection will improve Outcome: Progressing   Problem: Urinary Elimination: Goal: Ability to avoid or minimize complications of infection will improve Outcome: Progressing Goal: Ability to achieve and maintain urine output will improve Outcome: Progressing Goal: Home care management will improve Outcome: Progressing   Problem: Education: Goal: Knowledge of General Education information will improve Description: Including pain rating scale, medication(s)/side effects and non-pharmacologic comfort measures Outcome: Progressing   Problem: Health Behavior/Discharge Planning: Goal: Ability to manage health-related needs will improve Outcome: Progressing   Problem: Clinical Measurements: Goal: Ability to maintain clinical measurements within normal limits will improve Outcome: Progressing Goal: Will remain free from infection Outcome: Progressing Goal: Diagnostic test results will improve Outcome: Progressing Goal: Respiratory complications will improve Outcome: Progressing Goal: Cardiovascular complication will be avoided Outcome: Progressing   Problem: Activity: Goal: Risk for activity intolerance will decrease Outcome: Progressing   Problem: Nutrition: Goal: Adequate nutrition will be maintained Outcome: Progressing   Problem: Coping: Goal: Level of anxiety will decrease Outcome: Progressing   Problem: Elimination: Goal: Will not experience complications related to bowel motility Outcome: Progressing Goal: Will not experience complications related to urinary  retention Outcome: Progressing   Problem: Pain Managment: Goal: General experience of comfort will improve and/or be controlled Outcome: Progressing   Problem: Safety: Goal: Ability to remain free from injury will improve Outcome: Progressing   Problem: Skin Integrity: Goal: Risk for impaired skin integrity will decrease Outcome: Progressing

## 2024-10-04 ENCOUNTER — Encounter: Admitting: Cardiology

## 2024-10-08 ENCOUNTER — Emergency Department (HOSPITAL_COMMUNITY)

## 2024-10-08 ENCOUNTER — Encounter (HOSPITAL_COMMUNITY): Payer: Self-pay | Admitting: Family Medicine

## 2024-10-08 ENCOUNTER — Other Ambulatory Visit: Payer: Self-pay | Admitting: Surgery

## 2024-10-08 ENCOUNTER — Inpatient Hospital Stay (HOSPITAL_COMMUNITY)
Admission: EM | Admit: 2024-10-08 | Discharge: 2024-10-20 | DRG: 418 | Disposition: A | Discharge status: Home / Self Care | Attending: Internal Medicine | Admitting: Internal Medicine

## 2024-10-08 DIAGNOSIS — I4510 Unspecified right bundle-branch block: Secondary | ICD-10-CM | POA: Diagnosis present

## 2024-10-08 DIAGNOSIS — Z951 Presence of aortocoronary bypass graft: Secondary | ICD-10-CM

## 2024-10-08 DIAGNOSIS — Z801 Family history of malignant neoplasm of trachea, bronchus and lung: Secondary | ICD-10-CM

## 2024-10-08 DIAGNOSIS — I4581 Long QT syndrome: Secondary | ICD-10-CM | POA: Diagnosis present

## 2024-10-08 DIAGNOSIS — R9431 Abnormal electrocardiogram [ECG] [EKG]: Secondary | ICD-10-CM | POA: Diagnosis present

## 2024-10-08 DIAGNOSIS — I5032 Chronic diastolic (congestive) heart failure: Secondary | ICD-10-CM | POA: Diagnosis present

## 2024-10-08 DIAGNOSIS — K8020 Calculus of gallbladder without cholecystitis without obstruction: Secondary | ICD-10-CM | POA: Diagnosis not present

## 2024-10-08 DIAGNOSIS — N179 Acute kidney failure, unspecified: Secondary | ICD-10-CM | POA: Diagnosis present

## 2024-10-08 DIAGNOSIS — I252 Old myocardial infarction: Secondary | ICD-10-CM | POA: Diagnosis not present

## 2024-10-08 DIAGNOSIS — K8050 Calculus of bile duct without cholangitis or cholecystitis without obstruction: Principal | ICD-10-CM | POA: Diagnosis present

## 2024-10-08 DIAGNOSIS — F32A Depression, unspecified: Secondary | ICD-10-CM | POA: Diagnosis present

## 2024-10-08 DIAGNOSIS — I110 Hypertensive heart disease with heart failure: Secondary | ICD-10-CM | POA: Diagnosis present

## 2024-10-08 DIAGNOSIS — F419 Anxiety disorder, unspecified: Secondary | ICD-10-CM | POA: Diagnosis present

## 2024-10-08 DIAGNOSIS — Z823 Family history of stroke: Secondary | ICD-10-CM | POA: Diagnosis not present

## 2024-10-08 DIAGNOSIS — I10 Essential (primary) hypertension: Secondary | ICD-10-CM | POA: Diagnosis not present

## 2024-10-08 DIAGNOSIS — Z8249 Family history of ischemic heart disease and other diseases of the circulatory system: Secondary | ICD-10-CM | POA: Diagnosis not present

## 2024-10-08 DIAGNOSIS — K5710 Diverticulosis of small intestine without perforation or abscess without bleeding: Secondary | ICD-10-CM | POA: Diagnosis not present

## 2024-10-08 DIAGNOSIS — J449 Chronic obstructive pulmonary disease, unspecified: Secondary | ICD-10-CM | POA: Diagnosis present

## 2024-10-08 DIAGNOSIS — I2510 Atherosclerotic heart disease of native coronary artery without angina pectoris: Secondary | ICD-10-CM | POA: Diagnosis present

## 2024-10-08 DIAGNOSIS — K3189 Other diseases of stomach and duodenum: Secondary | ICD-10-CM | POA: Diagnosis not present

## 2024-10-08 DIAGNOSIS — R933 Abnormal findings on diagnostic imaging of other parts of digestive tract: Secondary | ICD-10-CM | POA: Diagnosis not present

## 2024-10-08 DIAGNOSIS — Z8673 Personal history of transient ischemic attack (TIA), and cerebral infarction without residual deficits: Secondary | ICD-10-CM

## 2024-10-08 DIAGNOSIS — I959 Hypotension, unspecified: Secondary | ICD-10-CM | POA: Diagnosis not present

## 2024-10-08 DIAGNOSIS — K838 Other specified diseases of biliary tract: Secondary | ICD-10-CM | POA: Diagnosis not present

## 2024-10-08 DIAGNOSIS — Z953 Presence of xenogenic heart valve: Secondary | ICD-10-CM | POA: Diagnosis not present

## 2024-10-08 DIAGNOSIS — Z8349 Family history of other endocrine, nutritional and metabolic diseases: Secondary | ICD-10-CM

## 2024-10-08 DIAGNOSIS — Z887 Allergy status to serum and vaccine status: Secondary | ICD-10-CM

## 2024-10-08 DIAGNOSIS — R109 Unspecified abdominal pain: Secondary | ICD-10-CM | POA: Diagnosis present

## 2024-10-08 DIAGNOSIS — F17210 Nicotine dependence, cigarettes, uncomplicated: Secondary | ICD-10-CM | POA: Diagnosis present

## 2024-10-08 DIAGNOSIS — I4891 Unspecified atrial fibrillation: Secondary | ICD-10-CM | POA: Diagnosis present

## 2024-10-08 DIAGNOSIS — Z800 Family history of malignant neoplasm of digestive organs: Secondary | ICD-10-CM

## 2024-10-08 DIAGNOSIS — N281 Cyst of kidney, acquired: Secondary | ICD-10-CM | POA: Diagnosis present

## 2024-10-08 DIAGNOSIS — Z87891 Personal history of nicotine dependence: Secondary | ICD-10-CM | POA: Diagnosis not present

## 2024-10-08 DIAGNOSIS — K2960 Other gastritis without bleeding: Secondary | ICD-10-CM | POA: Diagnosis present

## 2024-10-08 DIAGNOSIS — Z7901 Long term (current) use of anticoagulants: Secondary | ICD-10-CM

## 2024-10-08 DIAGNOSIS — D649 Anemia, unspecified: Secondary | ICD-10-CM | POA: Diagnosis present

## 2024-10-08 DIAGNOSIS — R7989 Other specified abnormal findings of blood chemistry: Secondary | ICD-10-CM | POA: Diagnosis not present

## 2024-10-08 DIAGNOSIS — N400 Enlarged prostate without lower urinary tract symptoms: Secondary | ICD-10-CM | POA: Diagnosis present

## 2024-10-08 DIAGNOSIS — K811 Chronic cholecystitis: Secondary | ICD-10-CM | POA: Diagnosis not present

## 2024-10-08 DIAGNOSIS — K2970 Gastritis, unspecified, without bleeding: Secondary | ICD-10-CM | POA: Diagnosis not present

## 2024-10-08 DIAGNOSIS — K2289 Other specified disease of esophagus: Secondary | ICD-10-CM | POA: Diagnosis present

## 2024-10-08 DIAGNOSIS — K8065 Calculus of gallbladder and bile duct with chronic cholecystitis with obstruction: Principal | ICD-10-CM | POA: Diagnosis present

## 2024-10-08 DIAGNOSIS — Z888 Allergy status to other drugs, medicaments and biological substances status: Secondary | ICD-10-CM

## 2024-10-08 DIAGNOSIS — K7689 Other specified diseases of liver: Secondary | ICD-10-CM | POA: Diagnosis present

## 2024-10-08 DIAGNOSIS — K862 Cyst of pancreas: Secondary | ICD-10-CM | POA: Diagnosis present

## 2024-10-08 DIAGNOSIS — R1011 Right upper quadrant pain: Secondary | ICD-10-CM | POA: Diagnosis not present

## 2024-10-08 DIAGNOSIS — E785 Hyperlipidemia, unspecified: Secondary | ICD-10-CM | POA: Diagnosis present

## 2024-10-08 DIAGNOSIS — Z79899 Other long term (current) drug therapy: Secondary | ICD-10-CM

## 2024-10-08 DIAGNOSIS — I25110 Atherosclerotic heart disease of native coronary artery with unstable angina pectoris: Secondary | ICD-10-CM | POA: Diagnosis not present

## 2024-10-08 DIAGNOSIS — I4892 Unspecified atrial flutter: Secondary | ICD-10-CM | POA: Diagnosis present

## 2024-10-08 DIAGNOSIS — R112 Nausea with vomiting, unspecified: Secondary | ICD-10-CM | POA: Diagnosis not present

## 2024-10-08 DIAGNOSIS — R5381 Other malaise: Secondary | ICD-10-CM | POA: Diagnosis present

## 2024-10-08 HISTORY — DX: Presence of cardiac pacemaker: Z95.0

## 2024-10-08 LAB — COMPREHENSIVE METABOLIC PANEL WITH GFR
ALT: 188 U/L — ABNORMAL HIGH (ref 0–44)
AST: 414 U/L — ABNORMAL HIGH (ref 15–41)
Albumin: 3.5 g/dL (ref 3.5–5.0)
Alkaline Phosphatase: 421 U/L — ABNORMAL HIGH (ref 38–126)
Anion gap: 12 (ref 5–15)
BUN: 15 mg/dL (ref 8–23)
CO2: 23 mmol/L (ref 22–32)
Calcium: 9.1 mg/dL (ref 8.9–10.3)
Chloride: 99 mmol/L (ref 98–111)
Creatinine, Ser: 1.3 mg/dL — ABNORMAL HIGH (ref 0.61–1.24)
GFR, Estimated: 57 mL/min — ABNORMAL LOW (ref >60)
Glucose, Bld: 111 mg/dL — ABNORMAL HIGH (ref 70–99)
Potassium: 4.3 mmol/L (ref 3.5–5.1)
Sodium: 134 mmol/L — ABNORMAL LOW (ref 135–145)
Total Bilirubin: 2.1 mg/dL — ABNORMAL HIGH (ref 0.0–1.2)
Total Protein: 7.9 g/dL (ref 6.5–8.1)

## 2024-10-08 LAB — CBC
HCT: 40.4 % (ref 39.0–52.0)
Hemoglobin: 12.6 g/dL — ABNORMAL LOW (ref 13.0–17.0)
MCH: 25.8 pg — ABNORMAL LOW (ref 26.0–34.0)
MCHC: 31.2 g/dL (ref 30.0–36.0)
MCV: 82.8 fL (ref 80.0–100.0)
Platelets: 246 K/uL (ref 150–400)
RBC: 4.88 MIL/uL (ref 4.22–5.81)
RDW: 16 % — ABNORMAL HIGH (ref 11.5–15.5)
WBC: 10.4 K/uL (ref 4.0–10.5)
nRBC: 0 % (ref 0.0–0.2)

## 2024-10-08 LAB — TROPONIN I (HIGH SENSITIVITY)
Troponin I (High Sensitivity): 21 ng/L — ABNORMAL HIGH (ref <18)
Troponin I (High Sensitivity): 20 ng/L — ABNORMAL HIGH (ref <18)

## 2024-10-08 LAB — MAGNESIUM: Magnesium: 2.2 mg/dL (ref 1.7–2.4)

## 2024-10-08 LAB — LIPASE, BLOOD: Lipase: 34 U/L (ref 11–51)

## 2024-10-08 MED ORDER — HEPARIN (PORCINE) 25000 UT/250ML-% IV SOLN
750.0000 [IU]/h | INTRAVENOUS | Status: DC
  Administered 2024-10-08: 1000 [IU]/h via INTRAVENOUS
  Administered 2024-10-09: 750 [IU]/h via INTRAVENOUS
  Filled 2024-10-08 (×2): qty 250

## 2024-10-08 MED ORDER — SODIUM CHLORIDE 0.9% FLUSH
3.0000 mL | Freq: Two times a day (BID) | INTRAVENOUS | Status: DC
  Administered 2024-10-08 – 2024-10-19 (×19): 3 mL via INTRAVENOUS

## 2024-10-08 MED ORDER — SENNA 8.6 MG PO TABS
1.0000 | ORAL_TABLET | Freq: Every day | ORAL | Status: DC | PRN
  Administered 2024-10-15 – 2024-10-16 (×2): 8.6 mg via ORAL
  Filled 2024-10-08 (×2): qty 1

## 2024-10-08 MED ORDER — FENTANYL CITRATE (PF) 50 MCG/ML IJ SOSY
12.5000 ug | PREFILLED_SYRINGE | INTRAMUSCULAR | Status: DC | PRN
  Administered 2024-10-14: 50 ug via INTRAVENOUS
  Filled 2024-10-08: qty 1

## 2024-10-08 MED ORDER — OXYCODONE HCL 5 MG PO TABS
5.0000 mg | ORAL_TABLET | ORAL | Status: DC | PRN
  Administered 2024-10-11 – 2024-10-15 (×5): 5 mg via ORAL
  Filled 2024-10-08 (×7): qty 1

## 2024-10-08 MED ORDER — PANTOPRAZOLE SODIUM 40 MG PO TBEC
40.0000 mg | DELAYED_RELEASE_TABLET | Freq: Every day | ORAL | Status: DC
  Administered 2024-10-09 – 2024-10-20 (×12): 40 mg via ORAL
  Filled 2024-10-08 (×12): qty 1

## 2024-10-08 MED ORDER — LACTATED RINGERS IV BOLUS
1000.0000 mL | Freq: Once | INTRAVENOUS | Status: AC
  Administered 2024-10-08: 1000 mL via INTRAVENOUS

## 2024-10-08 MED ORDER — FINASTERIDE 5 MG PO TABS
5.0000 mg | ORAL_TABLET | Freq: Every evening | ORAL | Status: DC
  Administered 2024-10-08 – 2024-10-19 (×11): 5 mg via ORAL
  Filled 2024-10-08 (×11): qty 1

## 2024-10-08 MED ORDER — PIPERACILLIN-TAZOBACTAM 3.375 G IVPB
3.3750 g | Freq: Three times a day (TID) | INTRAVENOUS | Status: DC
  Administered 2024-10-08 – 2024-10-12 (×9): 3.375 g via INTRAVENOUS
  Filled 2024-10-08 (×10): qty 50

## 2024-10-08 MED ORDER — ACETAMINOPHEN 500 MG PO TABS: 500.0000 mg | ORAL_TABLET | Freq: Four times a day (QID) | ORAL | Status: DC | PRN

## 2024-10-08 MED ORDER — PROCHLORPERAZINE EDISYLATE 10 MG/2ML IJ SOLN
5.0000 mg | Freq: Four times a day (QID) | INTRAMUSCULAR | Status: DC | PRN
  Administered 2024-10-11 – 2024-10-16 (×4): 5 mg via INTRAVENOUS
  Filled 2024-10-08 (×4): qty 2

## 2024-10-08 NOTE — Progress Notes (Signed)
 PHARMACY - ANTICOAGULATION CONSULT NOTE  Pharmacy Consult for heparin  Indication: atrial fibrillation  Allergies  Allergen Reactions   Tetanus Toxoid-Containing Vaccines Other (See Comments)    Childhood Allergy    Zetia  [Ezetimibe ] Diarrhea    Patient Measurements: Height: 5' 3 (160 cm) Weight: 65.7 kg (144 lb 12.8 oz) IBW/kg (Calculated) : 56.9 HEPARIN  DW (KG): 65.7  Vital Signs: Temp: 98.4 F (36.9 C) (10/21 2100) Temp Source: Oral (10/21 2100) BP: 112/73 (10/21 2130) Pulse Rate: 76 (10/21 2130)  Labs: Recent Labs    10/08/24 1749  HGB 12.6*  HCT 40.4  PLT 246  CREATININE 1.30*  TROPONINIHS 21*    Estimated Creatinine Clearance: 38.9 mL/min (A) (by C-G formula based on SCr of 1.3 mg/dL (H)).   Medical History: Past Medical History:  Diagnosis Date   Anemia    Anxiety    Arthritis    BPH (benign prostatic hyperplasia)    Status post biopsy in 2009. - w/ LUTS   Coronary artery disease    a. 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 Patient also with history of atrial fibrillation taking apixaban   PTA. Patient reports last dose of apixaban  was this morning 10/21. Surgery consulted; recommending holding Eliquis  and utilizing heparin  infusion for possibility of procedure but no indication for urgent intervention at this time.   Hgb stable 12.6 from previous 11.5; platelets WNL. Will monitor initially with aPTT given recent DOAC administration.   Goal of Therapy:  Heparin  level 0.3-0.7 units/ml aPTT 66-102 seconds Monitor platelets by anticoagulation protocol: Yes   Plan:  -Do not give heparin  bolus due to patient reported last dose of Eliquis  this morning. -Start heparin  infusion at 1000 units/h.  -Check aPTT in 8h. -Monitor aPTT, heparin  level, CBC daily.   Maurilio Patten, PharmD PGY1 Pharmacy Resident [facility] 10/08/2024 10:05 PM

## 2024-10-08 NOTE — H&P (Signed)
 Kyle Matthews

## 2024-10-08 NOTE — ED Triage Notes (Signed)
 Patient states he is having a gallbladder attack. Patient states he's had 4 in the past 2 days. This one started at about 1pm and has not stopped. Pt has been having n/v, pain in his chest and back. Denies shob and fever.

## 2024-10-08 NOTE — Progress Notes (Deleted)
  Electrophysiology Office Note:   Date:  10/08/2024  ID:  Kyle Matthews, DOB 27-Apr-1946, MRN [id number]  Primary Cardiologist: Lurena MARLA Red, MD Primary Heart Failure: None Electrophysiologist: Will Gladis Norton, MD   {Click to update primary MD,subspecialty MD or APP then REFRESH:1}    History of Present Illness:   Kyle Matthews is a 78 y.o. male with h/o severe AS s/p AVR with 23 mm Edwards Inspiris Resilia pericardial valve in 11/24, CAD s/p CABG (LIMA to LAD, SVG to Ramus) at time of AVR, post-op AF, HTN, HLD, CVA, COPD, tobacco use, anemia, BPH s/p robot-assisted prostatectomy seen today for routine electrophysiology follow-up s/p Pacemaker implant.  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 MRI of brain on initial work up showed L occipital lobe infarct.  He underwent redo sternotomy with Bentall procedure and tricuspid valve replacement (27 mm MDT Mosaic porcine valve), with CABG x1. 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 S/p PPM (Atrial lead & CS lead) on 07/01/24.   DCCV 09/13/24 ***  Admit 10/8-10/16 for cholelithiasis ***  Since last being seen in our clinic the patient reports doing ***.    He ***denies chest pain, palpitations, dyspnea, PND, orthopnea, nausea, vomiting, dizziness, syncope, edema, weight gain, or early satiety.    Review of systems complete and found to be negative unless listed in HPI.   EP Information / Studies Reviewed:    EKG is ordered today. Personal review as below.      PPM Interrogation-  reviewed in detail today,  See PACEART report.  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   Wt Readings from Last 3 Encounters:  10/08/24 144 lb 12.8 oz (65.7 kg)  09/25/24 155 lb (70.3 kg)  09/05/24 156 lb 12.8 oz (71.1 kg)     GEN: Well nourished, well  developed in no acute distress NECK: No JVD; No carotid bruits CARDIAC: {EPRHYTHM:[id number]}, no murmurs, rubs, gallops RESPIRATORY:  Clear to auscultation without rales, wheezing or rhonchi  ABDOMEN: Soft, non-tender, non-distended EXTREMITIES:  No edema; No deformity   ASSESSMENT AND PLAN:    CHB s/p Abbott PPM  -Normal PPM function -See Pace Art report -No changes today  Staph Epidermis Endocarditis  -per ID   CAD  VHD s/p AVR and TVR  S/p CABG 10/2023, redo 06/26/24 -per TCTS    Disposition:   Follow up with Dr. Norton {EPFOLLOW LE:71826}  Signed, Daphne Barrack, NP-C, AGACNP-BC [facility] - Electrophysiology  10/09/2024, 7:45 AM

## 2024-10-08 NOTE — Consult Note (Addendum)
 Kyle Matthews 1946/03/25  [id number].    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  He was admitted 10/8-10/16 with concern for cholecystitis. Cardiology recommended that he continue on Eliquis  so surgery was deferred. IR was consulted to consider a cholecystotomy tube, however, his GB was contracted and a tube was deferred. He ultimately discharged on antibiotics with a plan to consider intervention at a later date. He reports that he was tolerating PO without issues for the first few days but then yesterday he developed recurrent RUQ pain several times throughout the day. Today the pain recurred and did not resolve. + nausea and emesis.  US  was performed and showed CBD dilation w/ possible choledocholithiasis and cholelithiasis w/o evidence of cholecystitis. Labs notable for AST 414, ALT 188, Tbili 2.1, Cr 1.3.   His last dose of Eliquis  was this morning  ROS: Review of Systems  Constitutional:  Positive for malaise/fatigue.  HENT: Negative.    Eyes: Negative.   Respiratory: Negative.    Cardiovascular: Negative.   Gastrointestinal:  Positive for abdominal pain, nausea and vomiting.  Genitourinary: Negative.   Musculoskeletal: Negative.   Skin: Negative.   Neurological: Negative.   Endo/Heme/Allergies: Negative.   Psychiatric/Behavioral: Negative.      Family History  Problem Relation Age of Onset   Coronary artery disease Mother 26       46. Was diagnosed with a blood clot - suspect that this was a STEMI   Lung cancer Father    Thyroid  disease Sister     Healthy Brother 35   Pancreatic cancer Sister 52   Cancer Maternal Grandmother    Heart disease Maternal Grandfather 40   Cancer Paternal Grandmother    Stroke Paternal Grandfather     Past Medical History:  Diagnosis Date   Anemia    Anxiety    Arthritis    BPH (benign prostatic hyperplasia)    Status post biopsy in 2009. - w/ LUTS   Coronary artery disease    a. 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   Severe aortic stenosis 04/13/2017   Stroke (HCC) 2007   No true etiology noted   UTI (urinary tract infection)     Past Surgical History:  Procedure Laterality Date   AORTIC VALVE REPLACEMENT N/A 11/02/2023   Procedure: AORTIC VALVE REPLACEMENT (AVR) USING 23 MM INSPIRIS RESILIA AORTIC VALVE;  Surgeon: Lucas Dorise POUR, MD;  Location: [facility] OR;  Service: Open Heart Surgery;  Laterality: N/A;   BENTALL PROCEDURE N/A 06/26/2024   Procedure: BENTALL PROCEDURE USING KONECT AORTIC VALVE CONDUIT SIZE ;  Surgeon: Lucas Dorise POUR, MD;  Location: [facility] OR;  Service: Open Heart Surgery;  Laterality: N/A;   CARDIOVERSION N/A 09/13/2024   Procedure: CARDIOVERSION;  Surgeon: Kate Lonni CROME, MD;  Location: [facility] INVASIVE CV LAB;  Service: Cardiovascular;  Laterality: N/A;   CORONARY ARTERY BYPASS GRAFT N/A 11/02/2023   Procedure: CORONARY ARTERY BYPASS GRAFTING (CABG) X TWO USING LEFT INTERNAL MAMMARY ARTERY AND RIGHT GREATER SAPHENEOUS VEIN HARVESTED ENDOSCOPICLY;  Surgeon: Lucas Dorise POUR, MD;  Location: [facility] OR;  Service: Open Heart Surgery;   Laterality: N/A;   CORONARY ARTERY BYPASS GRAFT N/A 06/26/2024   Procedure: CORONARY ARTERY BYPASS GRAFTING (CABG) TIMES ONE USING ENDOSCOPICALLY HARVESTED LEFT GREATER SAPHENOUS VEIN;  Surgeon: Lucas Dorise POUR, MD;  Location: [facility] OR;  Service: Open Heart Surgery;  Laterality: N/A;   CORONARY PRESSURE/FFR STUDY N/A 10/09/2023   Procedure: CORONARY PRESSURE/FFR STUDY;  Surgeon: Wendel Lurena POUR, MD;  Location: [facility] INVASIVE CV LAB;  Service: Cardiovascular;  Laterality: N/A;   IR ANGIOGRAM PELVIS SELECTIVE OR SUPRASELECTIVE  10/27/2023   IR ANGIOGRAM PELVIS SELECTIVE OR SUPRASELECTIVE  10/27/2023   IR ANGIOGRAM SELECTIVE EACH ADDITIONAL VESSEL  10/27/2023   IR ANGIOGRAM SELECTIVE EACH ADDITIONAL VESSEL  10/27/2023   IR ANGIOGRAM SELECTIVE EACH ADDITIONAL VESSEL  10/27/2023   IR ANGIOGRAM SELECTIVE EACH ADDITIONAL VESSEL  10/27/2023   IR EMBO ARTERIAL NOT HEMORR HEMANG INC GUIDE ROADMAPPING  10/27/2023   IR US  GUIDE VASC ACCESS RIGHT  10/27/2023   PACEMAKER IMPLANT N/A 07/01/2024   Procedure: PACEMAKER IMPLANT;  Surgeon: Inocencio Soyla Lunger, MD;  Location: [facility] INVASIVE CV LAB;  Service: Cardiovascular;  Laterality: N/A;   REDO STERNOTOMY N/A 06/26/2024   Procedure: REDO STERNOTOMY;  Surgeon: Lucas Dorise POUR, MD;  Location: [facility];  Service: Open Heart Surgery;  Laterality: N/A;   RIGHT HEART CATH AND CORONARY ANGIOGRAPHY N/A 06/20/2024   Procedure: RIGHT HEART CATH AND CORONARY ANGIOGRAPHY;  Surgeon: Ladona Heinz, MD;  Location: [facility] INVASIVE CV LAB;  Service: Cardiovascular;  Laterality: N/A;   RIGHT/LEFT HEART CATH AND CORONARY ANGIOGRAPHY N/A 10/09/2023   Procedure: RIGHT/LEFT HEART CATH AND CORONARY ANGIOGRAPHY;  Surgeon: Wendel Lurena POUR, MD;  Location: [facility] INVASIVE CV LAB;  Service: Cardiovascular;  Laterality: N/A;   TEE WITHOUT CARDIOVERSION N/A 11/02/2023   Procedure: TRANSESOPHAGEAL ECHOCARDIOGRAM;  Surgeon: Lucas Dorise POUR, MD;  Location: [facility] OR;  Service: Open Heart Surgery;  Laterality: N/A;   TEE WITHOUT  CARDIOVERSION N/A 06/26/2024   Procedure: ECHOCARDIOGRAM, TRANSESOPHAGEAL;  Surgeon: Lucas Dorise POUR, MD;  Location: [facility];  Service: Open Heart Surgery;  Laterality: N/A;   TRANSESOPHAGEAL ECHOCARDIOGRAM (CATH LAB) N/A 06/18/2024   Procedure: TRANSESOPHAGEAL ECHOCARDIOGRAM;  Surgeon: Jeffrie Oneil BROCKS, MD;  Location: [facility] INVASIVE CV LAB;  Service: Cardiovascular;  Laterality: N/A;   TRANSTHORACIC ECHOCARDIOGRAM  04/28/2017    EF 65-70%. GR 1 DD. No regional wall motion abnormality. Moderate aortic stenosis with moderate regurgitation. Mean gradient 34 mmHg.   TRICUSPID VALVE REPLACEMENT N/A 06/26/2024   Procedure: TRICUSPID VALVE REPLACEMENT USING MOSIAC BRIOPROSTHESIS VALVE SIZE ;  Surgeon: Lucas Dorise POUR, MD;  Location: [facility] OR;  Service: Open Heart Surgery;  Laterality: N/A;   XI ROBOTIC ASSISTED SIMPLE PROSTATECTOMY N/A 05/22/2024   Procedure: PROSTATECTOMY, SIMPLE, ROBOT-ASSISTED;  Surgeon: Alvaro Ricardo KATHEE Mickey., MD;  Location: [facility] ORS;  Service: Urology;  Laterality: N/A;    Social History:  reports that he has been smoking cigarettes. He has a 36 pack-year smoking history. He has been exposed to tobacco smoke. He has quit using smokeless tobacco. He reports that he does not  currently use alcohol after a past usage of about 6.0 standard drinks of alcohol per week. He reports that he does not use drugs.  Allergies:  Allergies  Allergen Reactions   Tetanus Toxoid-Containing Vaccines Other (See Comments)    Childhood Allergy    Zetia  [Ezetimibe ] Diarrhea    (Not in a hospital admission)   Physical Exam: Blood pressure 122/81, pulse 72, temperature 98.4 F (36.9 C), resp. rate 14, height 5' 3 (1.6 m), weight 65.7 kg, SpO2 100%. Gen: male, NAD Abd: TTP in the epigastric and RUQ  Results for orders placed or performed during the hospital encounter of 10/08/24 (from the past 48 hours)  CBC     Status: Abnormal   Collection Time: 10/08/24  5:49 PM  Result Value Ref Range   WBC 10.4 4.0 -  10.5 K/uL   RBC 4.88 4.22 - 5.81 MIL/uL   Hemoglobin 12.6 (L) 13.0 - 17.0 g/dL   HCT 59.5 60.9 - 47.9 %   MCV 82.8 80.0 - 100.0 fL   MCH 25.8 (L) 26.0 - 34.0 pg   MCHC 31.2 30.0 - 36.0 g/dL   RDW 83.9 (H) 88.4 - 84.4 %   Platelets 246 150 - 400 K/uL   nRBC 0.0 0.0 - 0.2 %    Comment: Performed at [facility], [address], [place], [place] [address]  Comprehensive metabolic panel     Status: Abnormal   Collection Time: 10/08/24  5:49 PM  Result Value Ref Range   Sodium 134 (L) 135 - 145 mmol/L   Potassium 4.3 3.5 - 5.1 mmol/L   Chloride 99 98 - 111 mmol/L   CO2 23 22 - 32 mmol/L   Glucose, Bld 111 (H) 70 - 99 mg/dL    Comment: Glucose reference range applies only to samples taken after fasting for at least 8 hours.   BUN 15 8 - 23 mg/dL   Creatinine, Ser 8.69 (H) 0.61 - 1.24 mg/dL   Calcium  9.1 8.9 - 10.3 mg/dL   Total Protein 7.9 6.5 - 8.1 g/dL   Albumin  3.5 3.5 - 5.0 g/dL   AST 585 (H) 15 - 41 U/L   ALT 188 (H) 0 - 44 U/L   Alkaline Phosphatase 421 (H) 38 - 126 U/L   Total Bilirubin 2.1 (H) 0.0 - 1.2 mg/dL   GFR, Estimated 57 (L) >60 mL/min    Comment: (NOTE) Calculated using the CKD-EPI Creatinine Equation (2021)    Anion gap 12 5 - 15    Comment: Performed at [facility], [address] [place], [place] [address]  Lipase, blood     Status: None   Collection Time: 10/08/24  5:49 PM  Result Value Ref Range   Lipase 34 11 - 51 U/L    Comment: Performed at [facility], [address], [place], [place] [address]  Troponin I (High Sensitivity)     Status: Abnormal   Collection Time: 10/08/24  5:49 PM  Result Value Ref Range   Troponin I (High Sensitivity) 21 (H) <18 ng/L    Comment: (NOTE) Elevated high sensitivity troponin I (hsTnI) values and significant  changes across serial measurements may suggest ACS but many other  chronic and acute conditions are known to elevate hsTnI results.  Refer to the Links section for chest pain algorithms and  additional  guidance. Performed at [facility], [address] [place], [place] [address]    US  Abdomen Limited RUQ (LIVER/GB)  Result Date: 10/08/2024 CLINICAL DATA:  151471 RUQ pain 151471 EXAM: ULTRASOUND ABDOMEN LIMITED RIGHT UPPER QUADRANT COMPARISON:  10/02/2024 FINDINGS: Gallbladder: Multiple radiopaque gallstones. No wall thickening or pericholecystic fluid. No sonographic Murphy's sign noted by sonographer. Common bile duct: Diameter: 9.5 mm.  Multiple small stones and biliary sludge. Liver: Normal echogenicity. Septated cyst in the right hepatic lobe measuring 3.1 cm. No intrahepatic biliary ductal dilation. Portal vein is patent on color Doppler imaging with normal direction of blood flow towards the liver. Right Kidney: Partially visualized. 5.2 cm upper pole cyst. No hydronephrosis or nephrolithiasis. Other: None. IMPRESSION: 1. Dilation of the common bile duct with possible small stones and biliary sludge present. Correlation with serum bilirubin recommended. A follow-up multiphase abdominal MRI with IV contrast is recommended for further characterization. 2. Cholecystolithiasis.  No changes of acute cholecystitis. Electronically Signed   By: Rogelia Myers M.D.   On: 10/08/2024 18:41   DG Chest Portable 1 View Result Date: 10/08/2024 CLINICAL DATA:  Chest tightness, back pain, nausea and vomiting EXAM: PORTABLE CHEST 1 VIEW COMPARISON:  09/30/2024 FINDINGS: Single frontal view of the chest demonstrates stable dual lead pacemaker and postsurgical changes from CABG and aortic valve replacement. The cardiac silhouette is stable. No acute airspace disease, effusion, or pneumothorax. No acute bony abnormalities. IMPRESSION: 1. Stable chest, no acute process. Electronically Signed   By: Ozell Daring M.D.   On: 10/08/2024 18:22    Assessment/Plan 78 y/o M w/ c/f choledocholithiasis  - No indication for urgent intervention - Admission to medicine - Zosyn  - Repeat LFTs/Tbili in  AM - Hold Eliquis  tonight. Okay to start heparin  gtt - Surgery will follow  FEN - CLD, MIVF per primary VTE - Holding Eliquis , Okay for heparin  gtt ID - Zosyn  Admit - medicine  Cordella DELENA Polly Marlis Cheron Surgery 10/08/2024, 8:36 PM Please see Amion for pager number during day hours 7:00am-4:30pm or 7:00am -11:30am on weekends

## 2024-10-08 NOTE — ED Provider Notes (Signed)
 [place] EMERGENCY DEPARTMENT AT [facility] Provider Note   CSN: [id number] Arrival date & time: 10/08/24  1723     Patient presents with: No chief complaint on file.   Kyle Matthews is a 78 y.o. male.   This is a 78 year old male presenting emergency department with concern for another gallbladder attack.  Recently in the hospital for same unable to have gallbladder removed secondary to anticoagulation use.  Discharged on antibiotics.  Reports that he has had worsening symptoms yesterday and today with pain and inability to tolerate p.o.  He took his home medications prior to arrival reports pain is improved, but still has some nausea.  No shortness of breath.  No fevers.        Prior to Admission medications   Medication Sig Start Date End Date Taking? Authorizing Provider  acetaminophen  (TYLENOL ) 325 MG tablet Take 2 tablets (650 mg total) by mouth every 6 (six) hours as needed for mild pain (pain score 1-3) or fever (or Fever >/= 101). 10/03/24  Yes Fausto Sor A, DO  amiodarone  (PACERONE ) 200 MG tablet Take 1 tablet (200 mg total) by mouth daily. 08/13/24  Yes Fenton, Clint R, PA  amLODipine  (NORVASC ) 5 MG tablet Take 0.5 tablets (2.5 mg total) by mouth daily. Patient taking differently: Take 5 mg by mouth daily. 08/21/24  Yes Pearlean Manus, MD  amoxicillin-clavulanate (AUGMENTIN) 875-125 MG tablet Take 1 tablet by mouth 2 (two) times daily for 28 days. 10/03/24 10/31/24 Yes Fausto Sor A, DO  apixaban  (ELIQUIS ) 5 MG TABS tablet Take 1 tablet (5 mg total) by mouth 2 (two) times daily. 09/05/24  Yes Fenton, Clint R, PA  diphenhydrAMINE  (BENADRYL ) 25 MG tablet Take 25 mg by mouth every 6 (six) hours as needed for itching.   Yes [provider]  diphenhydrAMINE -zinc  acetate (BENADRYL ) cream Apply 1 Application topically 3 (three) times daily as needed for itching.   Yes [provider]  finasteride  (PROSCAR ) 5 MG tablet Take 5 mg by mouth every  evening.   Yes [provider]  furosemide  (LASIX ) 40 MG tablet Take 1 tablet (40 mg total) by mouth daily. 08/21/24  Yes Emokpae, Courage, MD  nitroGLYCERIN  (NITROSTAT ) 0.4 MG SL tablet Place 1 tablet (0.4 mg total) under the tongue every 5 (five) minutes as needed for chest pain. 10/19/23  Yes Thukkani, Arun K, MD  ondansetron  (ZOFRAN -ODT) 4 MG disintegrating tablet Take 1 tablet (4 mg total) by mouth every 8 (eight) hours as needed for nausea or vomiting. 10/03/24  Yes Fausto Sor A, DO  oxyCODONE  (OXY IR/ROXICODONE ) 5 MG immediate release tablet Take 1 tablet (5 mg total) by mouth every 4 (four) hours as needed for moderate pain (pain score 4-6). 10/03/24  Yes Fausto Sor A, DO  Potassium Chloride  ER 20 MEQ TBCR Take 1 tablet by mouth daily. 07/04/24  Yes [provider]  rosuvastatin  (CRESTOR ) 10 MG tablet Take 1 tablet (10 mg total) by mouth daily. Patient taking differently: Take 40 mg by mouth daily. 07/04/24  Yes Barrett, Erin R, PA-C  senna-docusate (SENOKOT-S) 8.6-50 MG tablet Take 2 tablets by mouth at bedtime. 08/21/24 08/21/25 Yes Pearlean Manus, MD    Allergies: Tetanus toxoid-containing vaccines and Zetia  [ezetimibe ]    Review of Systems  Updated Vital Signs BP 112/73   Pulse 76   Temp 98.4 F (36.9 C) (Oral)   Resp 15   Ht 5' 3 (1.6 m)   Wt 65.7 kg   SpO2 98%  BMI 25.65 kg/m   Physical Exam  (all labs ordered are listed, but only abnormal results are displayed) Labs Reviewed  CBC - Abnormal; Notable for the following components:      Result Value   Hemoglobin 12.6 (*)    MCH 25.8 (*)    RDW 16.0 (*)    All other components within normal limits  COMPREHENSIVE METABOLIC PANEL WITH GFR - Abnormal; Notable for the following components:   Sodium 134 (*)    Glucose, Bld 111 (*)    Creatinine, Ser 1.30 (*)    AST 414 (*)    ALT 188 (*)    Alkaline Phosphatase 421 (*)    Total Bilirubin 2.1 (*)    GFR, Estimated 57 (*)    All other  components within normal limits  TROPONIN I (HIGH SENSITIVITY) - Abnormal; Notable for the following components:   Troponin I (High Sensitivity) 21 (*)    All other components within normal limits  TROPONIN I (HIGH SENSITIVITY) - Abnormal; Notable for the following components:   Troponin I (High Sensitivity) 20 (*)    All other components within normal limits  LIPASE, BLOOD  MAGNESIUM   COMPREHENSIVE METABOLIC PANEL WITH GFR  CBC  MAGNESIUM   APTT  HEPARIN  LEVEL (UNFRACTIONATED)    EKG: None  Radiology: US  Abdomen Limited RUQ (LIVER/GB) Result Date: 10/08/2024 CLINICAL DATA:  151471 RUQ pain 151471 EXAM: ULTRASOUND ABDOMEN LIMITED RIGHT UPPER QUADRANT COMPARISON:  10/02/2024 FINDINGS: Gallbladder: Multiple radiopaque gallstones. No wall thickening or pericholecystic fluid. No sonographic Murphy's sign noted by sonographer. Common bile duct: Diameter: 9.5 mm.  Multiple small stones and biliary sludge. Liver: Normal echogenicity. Septated cyst in the right hepatic lobe measuring 3.1 cm. No intrahepatic biliary ductal dilation. Portal vein is patent on color Doppler imaging with normal direction of blood flow towards the liver. Right Kidney: Partially visualized. 5.2 cm upper pole cyst. No hydronephrosis or nephrolithiasis. Other: None. IMPRESSION: 1. Dilation of the common bile duct with possible small stones and biliary sludge present. Correlation with serum bilirubin recommended. A follow-up multiphase abdominal MRI with IV contrast is recommended for further characterization. 2. Cholecystolithiasis.  No changes of acute cholecystitis. Electronically Signed   By: Rogelia Myers M.D.   On: 10/08/2024 18:41   DG Chest Portable 1 View Result Date: 10/08/2024 CLINICAL DATA:  Chest tightness, back pain, nausea and vomiting EXAM: PORTABLE CHEST 1 VIEW COMPARISON:  09/30/2024 FINDINGS: Single frontal view of the chest demonstrates stable dual lead pacemaker and postsurgical changes from CABG and  aortic valve replacement. The cardiac silhouette is stable. No acute airspace disease, effusion, or pneumothorax. No acute bony abnormalities. IMPRESSION: 1. Stable chest, no acute process. Electronically Signed   By: Ozell Daring M.D.   On: 10/08/2024 18:22     Procedures   Medications Ordered in the ED  piperacillin -tazobactam (ZOSYN ) IVPB 3.375 g (3.375 g Intravenous New Bag/Given 10/08/24 2127)  pantoprazole  (PROTONIX ) EC tablet 40 mg (has no administration in time range)  finasteride  (PROSCAR ) tablet 5 mg (5 mg Oral Given 10/08/24 2221)  sodium chloride  flush (NS) 0.9 % injection 3 mL (3 mLs Intravenous Given 10/08/24 2208)  acetaminophen  (TYLENOL ) tablet 500 mg (has no administration in time range)  oxyCODONE  (Oxy IR/ROXICODONE ) immediate release tablet 5 mg (has no administration in time range)  fentaNYL  (SUBLIMAZE ) injection 12.5-50 mcg (has no administration in time range)  senna (SENOKOT) tablet 8.6 mg (has no administration in time range)  prochlorperazine  (COMPAZINE ) injection 5 mg (has no administration in  time range)  heparin  ADULT infusion 100 units/mL (25000 units/250mL) (1,000 Units/hr Intravenous New Bag/Given 10/08/24 2234)  lactated ringers  bolus 1,000 mL (0 mLs Intravenous Stopped 10/08/24 2113)    Clinical Course as of 10/08/24 2303  Tue Oct 08, 2024  1737 Surgery note on 10/16:  71M with acute cholecystitis and possible gb perforation  - IR consulted for drainage but GB is decompressed and collections adjacent to gallbladder are not conclusive with cholecystitis they feel perc chole tube is not indicated. - patient is a high risk cardiac patient, underwent DCCV 9/26 and ideally would get 4 weeks of uninterrupted anticoagulation. If procedure is deemed urgent then could be switched to heparin  gtt. See cardiology note from 10/9.  - CT AP yesterday with gallbladder full of stones, question of early appendicitis question of pancreatitis - I suspect thickening of  appendix is likely secondary to gallbladder - amylase and lipase within normal limits - unfortunately I do not think perc chole drain is possible given small contracted gallbladder full of stones, surgery is not an option at the moment. May need to consider home on abx with plans for interval cholecystectomy when cleared from cardiology standpoint  - will discuss follow up with MD  [TY]  1933 US  Abdomen Limited RUQ (LIVER/GB) IMPRESSION: 1. Dilation of the common bile duct with possible small stones and biliary sludge present. Correlation with serum bilirubin recommended. A follow-up multiphase abdominal MRI with IV contrast is recommended for further characterization. 2. Cholecystolithiasis.  No changes of acute cholecystitis.   [TY]    Clinical Course User Index [TY] Neysa Caron PARAS, DO                                 Medical Decision Making 78 year old male with complex past medical history include hypertension hyperlipidemia prior stroke, CAD prosthetic valve, recent cardioversion and on Eliquis  per my chart review.  Also appears to have been recently admitted for symptomatic cholelithiasis.  Symptoms have returned and are per patient's report essentially unchanged from when he was in the hospital in terms of character, but more intense and has not been able to tolerate p.o.  Plan to get basic screening labs will get repeat ultrasound of his right upper quadrant given his known biliary pathology.  He is not having abdominal pain currently.  His EKG does have a significantly prolonged QTc thus limiting the use of antiemetics.  Amount and/or Complexity of Data Reviewed Independent Historian:     Details: Wife notes that he took pain medicines and nausea medicines prior to arrival External Data Reviewed:     Details: See ED course Labs: ordered. Decision-making details documented in ED Course. Radiology: ordered and independent interpretation performed. Decision-making details  documented in ED Course. ECG/medicine tests: ordered and independent interpretation performed. Discussion of management or test interpretation with external provider(s): Case discussed with GI who will see patient in consult.  Case also discussed with surgery who will also see patient in consult. Hospitalist will admit  Risk Decision regarding hospitalization. Diagnosis or treatment significantly limited by social determinants of health.      Final diagnoses:  Choledocholithiasis    ED Discharge Orders     None          Neysa Caron PARAS, DO 10/08/24 2303

## 2024-10-09 ENCOUNTER — Inpatient Hospital Stay (HOSPITAL_COMMUNITY)

## 2024-10-09 ENCOUNTER — Ambulatory Visit: Admitting: Surgery

## 2024-10-09 ENCOUNTER — Ambulatory Visit: Admitting: Pulmonary Disease

## 2024-10-09 DIAGNOSIS — R112 Nausea with vomiting, unspecified: Secondary | ICD-10-CM | POA: Diagnosis not present

## 2024-10-09 DIAGNOSIS — K8020 Calculus of gallbladder without cholecystitis without obstruction: Secondary | ICD-10-CM

## 2024-10-09 DIAGNOSIS — R7989 Other specified abnormal findings of blood chemistry: Secondary | ICD-10-CM

## 2024-10-09 DIAGNOSIS — R933 Abnormal findings on diagnostic imaging of other parts of digestive tract: Secondary | ICD-10-CM

## 2024-10-09 DIAGNOSIS — R1011 Right upper quadrant pain: Secondary | ICD-10-CM

## 2024-10-09 LAB — COMPREHENSIVE METABOLIC PANEL WITH GFR
ALT: 317 U/L — ABNORMAL HIGH (ref 0–44)
AST: 635 U/L — ABNORMAL HIGH (ref 15–41)
Albumin: 2.8 g/dL — ABNORMAL LOW (ref 3.5–5.0)
Alkaline Phosphatase: 486 U/L — ABNORMAL HIGH (ref 38–126)
Anion gap: 9 (ref 5–15)
BUN: 14 mg/dL (ref 8–23)
CO2: 24 mmol/L (ref 22–32)
Calcium: 8.8 mg/dL — ABNORMAL LOW (ref 8.9–10.3)
Chloride: 101 mmol/L (ref 98–111)
Creatinine, Ser: 1.13 mg/dL (ref 0.61–1.24)
GFR, Estimated: >60 mL/min (ref >60)
Glucose, Bld: 74 mg/dL (ref 70–99)
Potassium: 4.1 mmol/L (ref 3.5–5.1)
Sodium: 134 mmol/L — ABNORMAL LOW (ref 135–145)
Total Bilirubin: 2.6 mg/dL — ABNORMAL HIGH (ref 0.0–1.2)
Total Protein: 6.6 g/dL (ref 6.5–8.1)

## 2024-10-09 LAB — CBC
HCT: 35.6 % — ABNORMAL LOW (ref 39.0–52.0)
Hemoglobin: 11.2 g/dL — ABNORMAL LOW (ref 13.0–17.0)
MCH: 25.5 pg — ABNORMAL LOW (ref 26.0–34.0)
MCHC: 31.5 g/dL (ref 30.0–36.0)
MCV: 81.1 fL (ref 80.0–100.0)
Platelets: 209 K/uL (ref 150–400)
RBC: 4.39 MIL/uL (ref 4.22–5.81)
RDW: 15.8 % — ABNORMAL HIGH (ref 11.5–15.5)
WBC: 7.2 K/uL (ref 4.0–10.5)
nRBC: 0 % (ref 0.0–0.2)

## 2024-10-09 LAB — APTT
aPTT: 102 s — ABNORMAL HIGH (ref 24–36)
aPTT: 110 s — ABNORMAL HIGH (ref 24–36)

## 2024-10-09 LAB — HEPARIN LEVEL (UNFRACTIONATED): Heparin Unfractionated: >1.1 [IU]/mL — ABNORMAL HIGH (ref 0.30–0.70)

## 2024-10-09 LAB — MAGNESIUM: Magnesium: 2.2 mg/dL (ref 1.7–2.4)

## 2024-10-09 MED ORDER — GADOBUTROL 1 MMOL/ML IV SOLN
7.0000 mL | Freq: Once | INTRAVENOUS | Status: AC | PRN
  Administered 2024-10-09: 7 mL via INTRAVENOUS

## 2024-10-09 NOTE — Consult Note (Signed)
 [facility] Consult Note   History Kyle Matthews MRN # [id number]  Date of Admission: 10/08/2024 Date of Consultation: 10/09/2024 Referring physician: Dr. Lenon Marien CROME, MD Primary Care Provider: [name], DO Primary Gastroenterologist: none   Reason for Consultation/Chief Complaint: Cholecystitis, elevated LFTs, possible choledocholithiasis   Subjective  HPI:  78 year old man admitted through the ED last night for recurrent upper abdominal/right upper quadrant pain and nausea with vomiting. He has had 3 recent admissions for acute cholecystitis and was seen in surgical consultation.  Surgical plans were on hold because the patient also had atrial fibrillation and was on anticoagulation requiring cardiac clearance.  During the most recent admission earlier this month he had acute cholecystitis based on imaging and was placed on Zosyn  but surgery put off for several weeks since he required cardioversion for his A-fib.  He then needed several weeks of uninterrupted anticoagulation afterward. His pain with nausea and vomiting recurred and intensified yesterday and he came to the ED for admission. Denies fever or chills.  Currently has no chest pain or dyspnea.  His abdominal pain has improved from admission but still present.  No longer vomiting.  ROS:  Constitutional: No recent weight loss   All other systems are negative except as noted above in the HPI  Past Medical History Past Medical History:  Diagnosis Date   Anemia    Anxiety    Arthritis    BPH (benign prostatic hyperplasia)    Status post biopsy in 2009. - w/ LUTS   Coronary artery disease    a. 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   Severe aortic stenosis 04/13/2017   Stroke (HCC) 2007   No true etiology noted   UTI (urinary tract infection)     Past Surgical History Past Surgical History:  Procedure Laterality Date   AORTIC VALVE REPLACEMENT N/A 11/02/2023   Procedure: AORTIC VALVE REPLACEMENT (AVR) USING 23 MM INSPIRIS RESILIA AORTIC VALVE;  Surgeon: Lucas Dorise POUR, MD;  Location: [facility] OR;  Service: Open Heart Surgery;  Laterality: N/A;   BENTALL PROCEDURE N/A 06/26/2024   Procedure: BENTALL PROCEDURE USING KONECT AORTIC VALVE CONDUIT SIZE ;  Surgeon: Lucas Dorise POUR, MD;  Location: [facility] OR;  Service: Open Heart Surgery;  Laterality: N/A;   CARDIOVERSION N/A 09/13/2024   Procedure: CARDIOVERSION;  Surgeon: Kate Lonni CROME, MD;  Location: [facility] INVASIVE CV LAB;  Service: Cardiovascular;  Laterality: N/A;   CORONARY ARTERY BYPASS GRAFT N/A 11/02/2023   Procedure: CORONARY ARTERY BYPASS GRAFTING (CABG) X TWO USING LEFT INTERNAL MAMMARY ARTERY AND RIGHT GREATER SAPHENEOUS VEIN HARVESTED ENDOSCOPICLY;  Surgeon: Lucas Dorise POUR, MD;  Location: [facility] OR;  Service: Open Heart Surgery;  Laterality: N/A;   CORONARY ARTERY BYPASS GRAFT N/A 06/26/2024   Procedure: CORONARY ARTERY BYPASS GRAFTING (CABG) TIMES ONE USING ENDOSCOPICALLY HARVESTED LEFT GREATER SAPHENOUS VEIN;  Surgeon: Lucas Dorise POUR, MD;  Location: [facility] OR;  Service: Open Heart Surgery;  Laterality: N/A;   CORONARY PRESSURE/FFR STUDY N/A 10/09/2023   Procedure: CORONARY PRESSURE/FFR STUDY;  Surgeon:  Thukkani, Arun K, MD;  Location: [facility] INVASIVE CV LAB;  Service: Cardiovascular;  Laterality: N/A;   IR ANGIOGRAM PELVIS SELECTIVE OR SUPRASELECTIVE  10/27/2023   IR ANGIOGRAM PELVIS SELECTIVE OR SUPRASELECTIVE  10/27/2023   IR ANGIOGRAM SELECTIVE EACH ADDITIONAL VESSEL  10/27/2023   IR  ANGIOGRAM SELECTIVE EACH ADDITIONAL VESSEL  10/27/2023   IR ANGIOGRAM SELECTIVE EACH ADDITIONAL VESSEL  10/27/2023   IR ANGIOGRAM SELECTIVE EACH ADDITIONAL VESSEL  10/27/2023   IR EMBO ARTERIAL NOT HEMORR HEMANG INC GUIDE ROADMAPPING  10/27/2023   IR US  GUIDE VASC ACCESS RIGHT  10/27/2023   PACEMAKER IMPLANT N/A 07/01/2024   Procedure: PACEMAKER IMPLANT;  Surgeon: Inocencio Soyla Lunger, MD;  Location: [facility] INVASIVE CV LAB;  Service: Cardiovascular;  Laterality: N/A;   REDO STERNOTOMY N/A 06/26/2024   Procedure: REDO STERNOTOMY;  Surgeon: Lucas Dorise POUR, MD;  Location: [facility] OR;  Service: Open Heart Surgery;  Laterality: N/A;   RIGHT HEART CATH AND CORONARY ANGIOGRAPHY N/A 06/20/2024   Procedure: RIGHT HEART CATH AND CORONARY ANGIOGRAPHY;  Surgeon: Ladona Heinz, MD;  Location: [facility] INVASIVE CV LAB;  Service: Cardiovascular;  Laterality: N/A;   RIGHT/LEFT HEART CATH AND CORONARY ANGIOGRAPHY N/A 10/09/2023   Procedure: RIGHT/LEFT HEART CATH AND CORONARY ANGIOGRAPHY;  Surgeon: Wendel Lurena POUR, MD;  Location: [facility] INVASIVE CV LAB;  Service: Cardiovascular;  Laterality: N/A;   TEE WITHOUT CARDIOVERSION N/A 11/02/2023   Procedure: TRANSESOPHAGEAL ECHOCARDIOGRAM;  Surgeon: Lucas Dorise POUR, MD;  Location: [facility] OR;  Service: Open Heart Surgery;  Laterality: N/A;   TEE WITHOUT CARDIOVERSION N/A 06/26/2024   Procedure: ECHOCARDIOGRAM, TRANSESOPHAGEAL;  Surgeon: Lucas Dorise POUR, MD;  Location: [facility] OR;  Service: Open Heart Surgery;  Laterality: N/A;   TRANSESOPHAGEAL ECHOCARDIOGRAM (CATH LAB) N/A 06/18/2024   Procedure: TRANSESOPHAGEAL ECHOCARDIOGRAM;  Surgeon: Jeffrie Oneil BROCKS, MD;  Location: [facility] INVASIVE CV LAB;  Service: Cardiovascular;  Laterality: N/A;   TRANSTHORACIC ECHOCARDIOGRAM  04/28/2017    EF 65-70%. GR 1 DD. No regional wall motion abnormality. Moderate aortic stenosis with moderate regurgitation. Mean gradient 34 mmHg.   TRICUSPID VALVE REPLACEMENT N/A 06/26/2024   Procedure: TRICUSPID VALVE REPLACEMENT USING MOSIAC  BRIOPROSTHESIS VALVE SIZE ;  Surgeon: Lucas Dorise POUR, MD;  Location: [facility] OR;  Service: Open Heart Surgery;  Laterality: N/A;   XI ROBOTIC ASSISTED SIMPLE PROSTATECTOMY N/A 05/22/2024   Procedure: PROSTATECTOMY, SIMPLE, ROBOT-ASSISTED;  Surgeon: Alvaro Ricardo KATHEE Mickey., MD;  Location: [facility] ORS;  Service: Urology;  Laterality: N/A;    Family History Family History  Problem Relation Age of Onset   Coronary artery disease Mother 19       72. 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  Tobacco Use   Smoking status: Every Day    Current packs/day: 0.00    Average packs/day: 1.5 packs/day for 24.0 years (36.0 ttl pk-yrs)  Types: Cigarettes    Last attempt to quit: 08/21/2023    Years since quitting: 1.1    Passive exposure: Past   Smokeless tobacco: Former   Tobacco comments:    Former smoker 07/17/24  Vaping Use   Vaping status: Never Used  Substance and Sexual Activity   Alcohol use: Not Currently    Alcohol/week: 6.0 standard drinks of alcohol    Types: 6 Cans of beer per week   Drug use: No   Sexual activity: Not Currently  Other Topics Concern   Not on file  Social History Narrative   Married father of 1. Lives with his wife of 49 years. He is a retired Marine scientist used to work for [employer]   4 cups of coffee a day.    No structured exercise regimen   09-05-24 Pt reports smoking 1 or 2 cigarettes on occasion    Social Drivers of Health   Financial Resource Strain: Not on file  Food Insecurity: No Food Insecurity (09/26/2024)   Hunger Vital Sign    Worried About Running Out of Food in the Last Year: Never true    Ran Out of Food in the Last Year: Never true  Transportation Needs: No Transportation Needs (09/26/2024)   PRAPARE - Administrator, Civil Service (Medical): No    Lack of Transportation (Non-Medical): No  Physical Activity: Not on file  Stress: Not on file  Social Connections: Socially Integrated (09/26/2024)   Social Connection and Isolation Panel    Frequency of Communication with Friends and Family: More than three times a week    Frequency of Social Gatherings with Friends and Family: Twice a week    Attends Religious Services: More than 4 times per year    Active Member of [employer] or Organizations: Yes    Attends Banker Meetings: Never    Marital Status: Married    Allergies Allergies  Allergen Reactions   Tetanus Toxoid-Containing Vaccines Other (See Comments)    Childhood Allergy    Zetia  [Ezetimibe ] Diarrhea    Outpatient Meds Home medications from the H+P and/or nursing med reconciliation reviewed.  Inpatient med list reviewed  _____________________________________________________________________ Objective   Exam:  Current vital signs  Patient Vitals for the past 8 hrs:  BP Temp Temp src Pulse Resp SpO2  10/09/24 0508 115/69 98.2 F (36.8 C) Oral 72 16 97 %    Intake/Output Summary (Last 24 hours) at 10/09/2024 0932 Last data filed at 10/08/2024 2113 Gross per 24 hour  Intake 1000 ml  Output --  Net 1000 ml    Physical Exam:   General: this is a male patient in no acute distress.  Laying in bed comfortably, conversational and appropriate. Eyes: sclera anicteric, no redness ENT: oral mucosa moist without lesions, no cervical or supraclavicular lymphadenopathy, good dentition CV: RRR without murmur, S1/S2, no JVD,, no  peripheral edema Resp: clear to auscultation bilaterally, normal RR and effort noted GI: soft, + right sided tenderness, with active bowel sounds.+ Guarding, no rebound tenderness.  Nondistended Skin; warm and dry,+ faintly jaundiced Neuro: awake, alert and oriented x 3. Normal gross motor function and fluent speech.  Labs:     Latest Ref Rng & Units 10/09/2024    4:20 AM 10/08/2024    5:49 PM 10/03/2024    5:36 AM  CBC  WBC 4.0 - 10.5 K/uL 7.2  10.4  6.3   Hemoglobin 13.0 - 17.0 g/dL 88.7  87.3  11.5  Hematocrit 39.0 - 52.0 % 35.6  40.4  36.0   Platelets 150 - 400 K/uL 209  246  235        Latest Ref Rng & Units 10/09/2024    4:20 AM 10/08/2024    5:49 PM 10/03/2024    5:36 AM  CMP  Glucose 70 - 99 mg/dL 74  888  86   BUN 8 - 23 mg/dL 14  15  7    Creatinine 0.61 - 1.24 mg/dL 8.86  8.69  9.18   Sodium 135 - 145 mmol/L 134  134  137   Potassium 3.5 - 5.1 mmol/L 4.1  4.3  2.9   Chloride 98 - 111 mmol/L 101  99  99   CO2 22 - 32 mmol/L 24  23  26    Calcium  8.9 - 10.3 mg/dL 8.8  9.1  8.7   Total Protein 6.5 - 8.1 g/dL 6.6  7.9  6.6   Total Bilirubin 0.0 - 1.2 mg/dL 2.6  2.1  0.8   Alkaline Phos 38 - 126 U/L 486  421  81   AST 15 - 41 U/L 635  414  27   ALT 0 - 44 U/L 317  188  18     No results for input(s): INR in the last 168 hours. _________________________________________________________ Radiologic studies: CLINICAL DATA:  801171 Common bile duct dilatation 801171; 220388 Abnormal CT of the abdomen 220388   EXAM: MRI ABDOMEN WITHOUT AND WITH CONTRAST (INCLUDING MRCP)   TECHNIQUE: Multiplanar multisequence MR imaging of the abdomen was performed both before and after the administration of intravenous contrast. Heavily T2-weighted images of the biliary and pancreatic ducts were obtained, and three-dimensional MRCP images were rendered by post processing.   CONTRAST:  7mL GADAVIST GADOBUTROL 1 MMOL/ML IV SOLN   COMPARISON:  Ultrasound abdomen from 09/26/2024  and CT scan abdomen and pelvis from 09/25/2024.   FINDINGS: Lower chest: Unremarkable MR appearance to the lung bases. No pleural effusion. No pericardial effusion. Normal heart size.   Hepatobiliary: The liver is normal in size. Noncirrhotic configuration. There are 2 simple cysts in the liver with largest in the right hepatic lobe measuring up to 2.1 x 3.3 cm. No suspicious liver lesion. No intrahepatic bile duct dilation. The extrahepatic bile duct is slightly prominent measuring up to 8 mm in the proximal portion, 8 mm in the midportion and gradually tapers up to 6 mm just before the ampulla of Vater. The extrahepatic bile duct diameters are within normal limits for patient's age. No obstructing mass or choledocholithiasis.   The gallbladder is physiologically distended and exhibit moderate-to-large amount of innumerable sub 5 mm gallstones. No abnormal gallbladder wall thickening. However, there are 3 adjacent mildly thick enhancing wall collections to the left of the gallbladder and posterior to the extrahepatic bile duct with largest collection measuring up to 1.6 x 2.7 cm (series 34, images 41-46). These collections are grossly similar to the prior study from 09/25/2024 but new since the prior study from 08/20/2024. Collections are adjacent to/inseparable from the gallbladder neck/cystic duct. There is also fat stranding in the right upper quadrant centered around this region. There is apparent transient hepatic intensity difference in the surrounding liver parenchyma (series 21, images 41-45). The duodenal, pancreatic head/uncinate process, right adrenal and right kidney appears within normal limits and likely not the source of inflammation. These constellation of findings raises the concern for probable small contained perforation of the gallbladder neck/cystic duct region. Correlate clinically.   Pancreas:  Normal T1 hyperintense appearance of the pancreas. There is a  1.1 x 1.2 cm multilobulated T2 hyperintense, nonenhancing structure in the uncinate process. There is an additional, 6 x 6 mm similar characteristic structure in the pancreas head/uncinate junction region (series 4, image 20). No direct communication of the structures noted with the pancreatic side branch or main duct. Differential diagnosis includes pancreatic side branch IPMN or pseudocyst. Attention on follow-up examination is recommended.   The pancreas is otherwise unremarkable. Main pancreatic duct is not dilated. No peripancreatic fat stranding.   Spleen:  Within normal limits in size and appearance. No focal mass.   Adrenals/Urinary Tract: Unremarkable adrenal glands. No hydroureteronephrosis on either side. There are multiple bilateral renal cysts with largest arising from the right kidney upper pole measuring up to 5.2 x 6.2 cm. There are also multiple structures with varying amount of T1 hyperintense components with largest T1 hyperintense structure arising from the left kidney interpolar region, anteriorly measuring up to 1.4 x 2.0 cm, which can be characterized as a proteinaceous/hemorrhagic cysts. The other structures with T1 hyperintense contents and lack of enhancement also favor small quantity of dependent proteinaceous/hemorrhagic contents. No suspicious renal mass.   Stomach/Bowel: Visualized portions within the abdomen are unremarkable. No disproportionate dilation of bowel loops.   Vascular/Lymphatic: No pathologically enlarged lymph nodes identified. No abdominal aortic aneurysm demonstrated. There is trace amount of ascites along the right posteroinferior liver surface   Other:  None.   Musculoskeletal: No suspicious bone lesions identified.   IMPRESSION: 1. There are 3 adjacent mildly thick enhancing walled collections to the left of the gallbladder and posterior to the extrahepatic bile duct with largest collection measuring up to 1.6 x 2.7 cm.  These collections are adjacent to/inseparable from the gallbladder neck/cystic duct. There is also fat stranding in the right upper quadrant centered around this region. There is apparent transient hepatic intensity difference in the surrounding liver parenchyma. The duodenum, pancreatic head/uncinate process, right adrenal and right kidney appears within normal limits and likely not the source of inflammation. These constellation of findings raises the concern for probable small contained perforation of the gallbladder neck/cystic duct region with resultant mild complex collection. Correlate clinically. 2. There is a 1.1 x 1.2 cm multilobulated T2 hyperintense, nonenhancing structure in the uncinate process. There is an additional, 6 x 6 mm similar characteristic structure in the pancreas head/uncinate junction region. No direct communication of the structures noted with the pancreatic side branch or main duct. Differential diagnosis includes pancreatic side branch IPMN or pseudocyst. Attention on follow-up examination is recommended. 3. Multiple bilateral renal simple renal cysts as well as cysts with proteinaceous/hemorrhagic contents. No suspicious renal mass. 4. Multiple other nonacute observations, as described above.     Electronically Signed   By: Ree Molt M.D.   On: 09/27/2024 12:17  ____________________  Narrative & Impression  CLINICAL DATA:  Acute onset upper abdominal pain, initial encounter   EXAM: CT ABDOMEN AND PELVIS WITH CONTRAST   TECHNIQUE: Multidetector CT imaging of the abdomen and pelvis was performed using the standard protocol following bolus administration of intravenous contrast.   RADIATION DOSE REDUCTION: This exam was performed according to the departmental dose-optimization program which includes automated exposure control, adjustment of the mA and/or kV according to patient size and/or use of iterative reconstruction technique.    CONTRAST:  OMNIPAQUE  IOHEXOL  300 MG/ML  SOLN   COMPARISON:  08/21/2024 ultrasound, CT from 08/20/2024   FINDINGS: Lower chest: No acute abnormality.  Hepatobiliary: Scattered cysts are noted within the liver. The gallbladder is well distended with multiple small gallstones. Persistent inflammatory changes noted surrounding the common bile duct and central gallbladder as well as the second portion of the duodenum. This is increased when compared with the prior exam.   Pancreas: Pancreas is within normal limits.   Spleen: Normal in size without focal abnormality. Calcified granulomas are again seen.   Adrenals/Urinary Tract: Adrenal glands are within normal limits. Partially calcified cysts are noted within the kidneys bilaterally stable from the prior exam. No follow-up is recommended. No renal calculi are seen. No obstructive changes are noted. The bladder is partially distended. Some wall thickening of the bladder is noted stable from the prior exam.   Stomach/Bowel: Scattered diverticular change of the colon is noted without evidence of diverticulitis. The appendix is within normal limits. Stomach is unremarkable. Inflammatory changes surrounding the second portion of the duodenum are seen.   Vascular/Lymphatic: Aortic atherosclerosis. No enlarged abdominal or pelvic lymph nodes.   Reproductive: Prostate is unremarkable.   Other: No abdominal wall hernia or abnormality. No abdominopelvic ascites.   Musculoskeletal: No acute or significant osseous findings.   IMPRESSION: Cholelithiasis. Prominence of the common bile duct is again identified and stable.   Inflammatory changes in the right upper quadrant near the common bile duct and second portion of the duodenum. These have increased in the interval from the prior exam and again may represent acute cholecystitis although the possibility of duodenitis deserves consideration as well.   Diverticulosis without  diverticulitis.   Bladder wall thickening stable from the prior exam which may represent cystitis.     Electronically Signed   By: Oneil Devonshire M.D.   On: 09/25/2024 23:30  _________________________  EXAM: CT ABDOMEN AND PELVIS WITH CONTRAST 10/02/2024 11:45:02 AM   TECHNIQUE: CT of the abdomen and pelvis was performed with the administration of intravenous contrast, 75mL (iohexol  (OMNIPAQUE ) 350 MG/ML injection 75 mL IOHEXOL  350 MG/ML SOLN). Multiplanar reformatted images are provided for review. Automated exposure control, iterative reconstruction, and/or weight-based adjustment of the mA/kV was utilized to reduce the radiation dose to as low as reasonably achievable.   COMPARISON: None available.   CLINICAL HISTORY: Sepsis, perforated cholecystitis.   FINDINGS:   LOWER CHEST: Small right pleural effusion and trace left pleural effusion with passive atelectasis. Cardiomegaly noted with pacer leads observed, a right ventricular lead is not visible although there is a coronary sinus lead and an atrial lead.   LIVER: The liver is unremarkable. Stable hepatic cyst.   GALLBLADDER AND BILE DUCTS: Gallbladder is full of gallstones. Common bile duct 0.5 cm in diameter, within normal limits.   SPLEEN: Punctate calcifications in the spleen are compatible with old granulomatous disease.   PANCREAS: Small fluid density lesional on the upper margin of the pancreatic head, 2.2 x 1.1 cm, increased conspicuity compared to prior exams, suspicious for a small pseudocyst. A 2.7 x 1.1 by 0.8 cm peripancreatic fluid collection with enhancing margins could represent an early pseudocyst or a small simple cyst. A 1.5 by 0.7 by 0.9 cm fluid collection along the medial margin of the pancreatic head medial to the distal common bile duct on image 48 series 4 likewise may represent a small pseudocyst or abscess.   KIDNEYS, URETERS AND BLADDER: No stones in the kidneys or ureters. No  hydronephrosis. 6.4x5.1 cm Bosniak category 2 cyst to the right kidney laterally has calcifications along its margins. Other Bosniak category 2 cysts are present bilaterally  and were shown on subtraction MRI imaging to not enhance compatible with benign Bosniak category 2 cysts. Small caliber thick-walled urinary bladder, cystitis not excluded.   GI AND BOWEL: Sigmoid colon diverticulosis. Proximal appendix appears thickened at 9 mm in diameter on image 40 series 2 and could be inflamed. However, the distal appendiceal tip is gas-filled and does not appear thickened. Difficult to exclude early acute appendicitis.   PERITONEUM AND RETROPERITONEUM: No ascites. No free air.   VASCULATURE: Systemic atherosclerosis is present, including the aorta and iliac arteries. Substantial calcified and noncalcified plaque causing stenosis in the proximal superior mesenteric artery although without total occlusion. There is also atheromatous plaque proximally in the celiac trunk without occlusion.   LYMPH NODES: Small retroperitoneal lymph nodes are likely reactive.   REPRODUCTIVE ORGANS: No acute abnormality.   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   IMPRESSION: 1. Proximal appendiceal thickening up to 9 mm; early acute appendicitis is difficult to exclude. 2. Small fluid density lesion at the upper margin of the pancreatic head, suspicious for a small pseudocyst. 3. Peripancreatic fluid collection with enhancing margins near the porta hepatis, possibly early pseudocyst or small simple cyst. 4. Small fluid collection along the medial pancreatic head, possibly a small pseudocyst or abscess. 5. Gallbladder full of gallstones. 6. Small right and trace left pleural effusions with passive atelectasis. 7. Cardiomegaly with atrial and coronary sinus pacer leads; right  ventricular lead not visualized. 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 8. 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 9. Small caliber thick-walled urinary bladder; cystitis not excluded. 10. Sigmoid diverticulosis.   Electronically signed by: Ryan Salvage MD 10/02/2024 03:48 PM EDT RP Workstation: [id number]   _______  Imaging from this admission:  COMPARISON:  10/02/2024   FINDINGS: Gallbladder:   Multiple radiopaque gallstones. No wall thickening or pericholecystic fluid. No sonographic Murphy's sign noted by sonographer.   Common bile duct:   Diameter: 9.5 mm.  Multiple small stones and biliary sludge.   Liver:   Normal echogenicity. Septated cyst in the right hepatic lobe measuring 3.1 cm. No intrahepatic biliary ductal dilation. Portal vein is patent on color Doppler imaging with normal direction of blood flow towards the liver.   Right Kidney:   Partially visualized. 5.2 cm upper pole cyst. No hydronephrosis or nephrolithiasis.   Other: None.   IMPRESSION: 1. Dilation of the common bile duct with possible small stones and biliary sludge present. Correlation with serum bilirubin recommended. A follow-up multiphase abdominal MRI with IV contrast is recommended for further characterization. 2. Cholecystolithiasis.  No changes of acute cholecystitis.     Electronically Signed   By: Rogelia Myers M.D.   On: 10/08/2024 18:41    ______________________________________________________ Other studies:   _______________________________________________________ Assessment & Plan  Impression:  Right upper quadrant pain-worsened yesterday with nausea and vomiting Gallstones and cholecystitis Significant LFT elevation, new from recent admissions. Ultrasound this admission suggesting possibility of choledocholithiasis, which coincides with the rising  LFTs.  Overall clinical picture concerning for gallbladder stone (s) having migrated from the gallbladder to the CBD. No fever or leukocytosis to suggest cholangitis.  Plan:  MRCP has been ordered and will probably be done later today.  Suspect there will be choledocholithiasis requiring ERCP.  If so, plans will be for ERCP with Dr. Wilhelmenia tomorrow. Anticipating that this is likely, I discussed the  procedure in detail along with risks and benefits.  The benefits and risks of the planned procedure(s) were described in detail with the patient or (when appropriate) their health care proxy.  Risks were outlined as including, but not limited to, bleeding, infection, perforation, adverse medication reaction leading to cardiac or pulmonary decompensation, pancreatitis (if ERCP).  The limitation of incomplete mucosal visualization was also discussed.  No guarantees or warranties were given. Patient at increased risk for cardiopulmonary complications of procedure due to medical comorbidities.  With the probable need for sphincterotomy and therefore bleeding risk, this patient needs to be off anticoagulation for the procedure.  His last dose of Eliquis  was yesterday morning, and he is currently on unfractionated heparin . No current chest pain dyspnea or evidence of acute coronary syndrome.  Currently on Zosyn  because he most likely still has cholecystitis, and I would continue that for the present time.  Final plans pending MRCP result  (Case reviewed with our biliary endoscopist Dr. Wilhelmenia)  This consultation required a high degree of medical decision making due to the nature and complexity of the acute condition(s) being evaluated as well as the patient's medical comorbidities.  Victory LITTIE Brand III Office: [phone]

## 2024-10-09 NOTE — Progress Notes (Signed)
 Progress Note     Subjective: Patient reports that pain has improved since arriving to the hospital. Feels it is mostly prominent of the right abdomen. Tolerating CLD. Denies nausea and vomiting. Has not had BM since yesterday. Reports flatulence.   ROS  All negative with the exception of above.   Objective: Vital signs in last 24 hours: Temp:  [98.1 F (36.7 C)-98.6 F (37 C)] 98.4 F (36.9 C) (10/22 1006) Pulse Rate:  [72-84] 72 (10/22 1006) Resp:  [14-18] 16 (10/22 1006) BP: (104-136)/(59-92) 110/59 (10/22 1006) SpO2:  [96 %-100 %] 97 % (10/22 1006) Weight:  [65.7 kg] 65.7 kg (10/21 1742) Last BM Date : 09/30/24  Intake/Output from previous day: 10/21 0701 - 10/22 0700 In: 1000 [IV Piggyback:1000] Out: -  Intake/Output this shift: Total I/O In: -  Out: 200 [Urine:200]  PE: General: Pleasant male who is laying in bed in NAD. HEENT: Head is normocephalic, atraumatic.  Heart: HR normal.  Lungs: Respiratory effort nonlabored. Abd: Soft, ND. Tenderness to palpation of ruq, rlq, and epigastric region. No rebound tenderness or guarding. Skin: Warm and dry. Psych: A&Ox3 with an appropriate affect.    Lab Results:  Recent Labs    10/08/24 1749 10/09/24 0420  WBC 10.4 7.2  HGB 12.6* 11.2*  HCT 40.4 35.6*  PLT 246 209   BMET Recent Labs    10/08/24 1749 10/09/24 0420  NA 134* 134*  K 4.3 4.1  CL 99 101  CO2 23 24  GLUCOSE 111* 74  BUN 15 14  CREATININE 1.30* 1.13  CALCIUM  9.1 8.8*   PT/INR No results for input(s): LABPROT, INR in the last 72 hours. CMP     Component Value Date/Time   NA 134 (L) 10/09/2024 0420   K 4.1 10/09/2024 0420   CL 101 10/09/2024 0420   CO2 24 10/09/2024 0420   GLUCOSE 74 10/09/2024 0420   BUN 14 10/09/2024 0420   CREATININE 1.13 10/09/2024 0420   CREATININE 0.91 09/11/2024 1353   CALCIUM  8.8 (L) 10/09/2024 0420   PROT 6.6 10/09/2024 0420   PROT 6.6 01/29/2024 1633   ALBUMIN  2.8 (L) 10/09/2024 0420   ALBUMIN  4.1  01/29/2024 1633   AST 635 (H) 10/09/2024 0420   ALT 317 (H) 10/09/2024 0420   ALKPHOS 486 (H) 10/09/2024 0420   BILITOT 2.6 (H) 10/09/2024 0420   BILITOT <0.2 01/29/2024 1633   GFRNONAA >60 10/09/2024 0420   Lipase     Component Value Date/Time   LIPASE 34 10/08/2024 1749       Studies/Results: MR ABDOMEN MRCP W WO CONTAST Result Date: 10/09/2024 CLINICAL DATA:  Jaundice and cholelithiasis EXAM: MRI ABDOMEN WITHOUT AND WITH CONTRAST (INCLUDING MRCP) TECHNIQUE: Multiplanar multisequence MR imaging of the abdomen was performed both before and after the administration of intravenous contrast. Heavily T2-weighted images of the biliary and pancreatic ducts were obtained, and three-dimensional MRCP images were rendered by post processing. CONTRAST:  7mL GADAVIST GADOBUTROL 1 MMOL/ML IV SOLN COMPARISON:  Ultrasound abdomen October 08, 2024. CT abdomen pelvis September 02, 2024. FINDINGS: Lower chest: No acute abnormality.  Median sternotomy. Hepatobiliary/MRCP/pancreas: Scattered T2 hyperintense liver cysts measuring up to 2.5 cm. Gallbladder contains multiple calculi. Normal wall thickness. Common bile duct is dilated up to 8.5 mm and contains multiple intraluminal filling defects in distal CBD consistent with choledocholithiasis/sludge. No significant intrahepatic biliary dilatation. Bifid configuration of the pancreatic duct with dominant duct of [place] drainage. 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 Additional cystic lesion measuring 1 cm is identified along the pancreatic head abutting the second portion of duodenum which may represent an exophytic pancreatic cyst. Differentials include duodenal diverticulum (7/54). No abnormal enhancement is identified associated with these lesions. Pancreatic [place] duct measures 4 mm at the pancreatic head. Pancreatic body and tail duct measures 3 mm. No  enhancing pancreatic lesion identified. Spleen: Normal in size without focal abnormality.  Small splenule. Adrenals/Urinary Tract: Adrenal glands are unremarkable. Bilateral simple and complex renal cortical cysts measuring up to 6.3 cm. Some of the cysts demonstrate T1 hyperintense components and hematocrit level suggestive of proteinaceous/hemorrhagic products. No suspicious enhancement or solid component. (Bosniak 2). No hydronephrosis. Stomach/Bowel: Stomach is within normal limits. No evidence of bowel wall thickening, distention, or inflammatory changes. Vascular/Lymphatic: No significant vascular findings are present. No enlarged abdominal or pelvic lymph nodes. Other: No abdominal wall hernia or abnormality. No ascites. Musculoskeletal: No acute or significant osseous findings. IMPRESSION: Cholelithiasis and partially obstructive choledocholithiasis and sludge, similar to prior. 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 Follow-up according to guidelines below. Bilateral simple and complex renal cortical cysts which does not require imaging follow-up, stable. (Bosniak 2) Scattered liver cysts, stable to prior. [place] consensus guideline for intraductal papillary mucinous neoplasms (IPMNs): Optimal imaging surveillance strategies for suspected BD-IPMNs <3 cm and without worrisome features is unclear, but the yearly incidence of transformation to pancreatic cancer is estimated at 0.4-1.1% per year: -Largest cyst less than 1 cm: CT or MRI/MRCP in 2-3 years -Largest cyst 1-2 cm: CT or MRI/MRCP annually for 2 years, then lengthen interval if no change -Largest cyst 2-3 cm: EUS in 6 months, then lengthen interval alternating MRI with EUS as appropriate consider surgery in young patients American Gastroenterological Association recommends stopping surveillance after 5 years if no significant change is observed or if a cyst is resected and found to be benign This is not a  recommendation that is explicitly stated in the [place] 2017 update 3. Electronically Signed   By: Megan  Zare M.D.   On: 10/09/2024 14:31   MR 3D Recon At Scanner Result Date: 10/09/2024 CLINICAL DATA:  Jaundice and cholelithiasis EXAM: MRI ABDOMEN WITHOUT AND WITH CONTRAST (INCLUDING MRCP) TECHNIQUE: Multiplanar multisequence MR imaging of the abdomen was performed both before and after the administration of intravenous contrast. Heavily T2-weighted images of the biliary and pancreatic ducts were obtained, and three-dimensional MRCP images were rendered by post processing. CONTRAST:  7mL GADAVIST GADOBUTROL 1 MMOL/ML IV SOLN COMPARISON:  Ultrasound abdomen October 08, 2024. CT abdomen pelvis September 02, 2024. FINDINGS: Lower chest: No acute abnormality.  Median sternotomy. Hepatobiliary/MRCP/pancreas: Scattered T2 hyperintense liver cysts measuring up to 2.5 cm. Gallbladder contains multiple calculi. Normal wall thickness. Common bile duct is dilated up to 8.5 mm and contains multiple intraluminal filling defects in distal CBD consistent with choledocholithiasis/sludge. No significant intrahepatic biliary dilatation. Bifid configuration of the pancreatic duct with dominant duct of [place] drainage. 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 Additional cystic lesion measuring 1 cm is identified along the pancreatic head abutting the second portion of duodenum which may represent an exophytic pancreatic cyst. Differentials include duodenal diverticulum (7/54). No abnormal enhancement is identified associated with these lesions. Pancreatic [place] duct measures 4 mm at the pancreatic head. Pancreatic body and tail duct measures 3  mm. No enhancing pancreatic lesion identified. Spleen: Normal in size without focal abnormality.  Small splenule. Adrenals/Urinary Tract: Adrenal glands are unremarkable. Bilateral  simple and complex renal cortical cysts measuring up to 6.3 cm. Some of the cysts demonstrate T1 hyperintense components and hematocrit level suggestive of proteinaceous/hemorrhagic products. No suspicious enhancement or solid component. (Bosniak 2). No hydronephrosis. Stomach/Bowel: Stomach is within normal limits. No evidence of bowel wall thickening, distention, or inflammatory changes. Vascular/Lymphatic: No significant vascular findings are present. No enlarged abdominal or pelvic lymph nodes. Other: No abdominal wall hernia or abnormality. No ascites. Musculoskeletal: No acute or significant osseous findings. IMPRESSION: Cholelithiasis and partially obstructive choledocholithiasis and sludge, similar to prior. 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 Follow-up according to guidelines below. Bilateral simple and complex renal cortical cysts which does not require imaging follow-up, stable. (Bosniak 2) Scattered liver cysts, stable to prior. [place] consensus guideline for intraductal papillary mucinous neoplasms (IPMNs): Optimal imaging surveillance strategies for suspected BD-IPMNs <3 cm and without worrisome features is unclear, but the yearly incidence of transformation to pancreatic cancer is estimated at 0.4-1.1% per year: -Largest cyst less than 1 cm: CT or MRI/MRCP in 2-3 years -Largest cyst 1-2 cm: CT or MRI/MRCP annually for 2 years, then lengthen interval if no change -Largest cyst 2-3 cm: EUS in 6 months, then lengthen interval alternating MRI with EUS as appropriate consider surgery in young patients American Gastroenterological Association recommends stopping surveillance after 5 years if no significant change is observed or if a cyst is resected and found to be benign This is not a recommendation that is explicitly stated in the [place] 2017 update 3. Electronically Signed   By: Megan  Zare M.D.   On: 10/09/2024 14:31   US  Abdomen Limited RUQ  (LIVER/GB) Result Date: 10/08/2024 CLINICAL DATA:  151471 RUQ pain 151471 EXAM: ULTRASOUND ABDOMEN LIMITED RIGHT UPPER QUADRANT COMPARISON:  10/02/2024 FINDINGS: Gallbladder: Multiple radiopaque gallstones. No wall thickening or pericholecystic fluid. No sonographic Murphy's sign noted by sonographer. Common bile duct: Diameter: 9.5 mm.  Multiple small stones and biliary sludge. Liver: Normal echogenicity. Septated cyst in the right hepatic lobe measuring 3.1 cm. No intrahepatic biliary ductal dilation. Portal vein is patent on color Doppler imaging with normal direction of blood flow towards the liver. Right Kidney: Partially visualized. 5.2 cm upper pole cyst. No hydronephrosis or nephrolithiasis. Other: None. IMPRESSION: 1. Dilation of the common bile duct with possible small stones and biliary sludge present. Correlation with serum bilirubin recommended. A follow-up multiphase abdominal MRI with IV contrast is recommended for further characterization. 2. Cholecystolithiasis.  No changes of acute cholecystitis. Electronically Signed   By: Rogelia Myers M.D.   On: 10/08/2024 18:41   DG Chest Portable 1 View Result Date: 10/08/2024 CLINICAL DATA:  Chest tightness, back pain, nausea and vomiting EXAM: PORTABLE CHEST 1 VIEW COMPARISON:  09/30/2024 FINDINGS: Single frontal view of the chest demonstrates stable dual lead pacemaker and postsurgical changes from CABG and aortic valve replacement. The cardiac silhouette is stable. No acute airspace disease, effusion, or pneumothorax. No acute bony abnormalities. IMPRESSION: 1. Stable chest, no acute process. Electronically Signed   By: Ozell Daring M.D.   On: 10/08/2024 18:22    Anti-infectives: Anti-infectives (From admission, onward)    Start     Dose/Rate Route Frequency Ordered Stop   10/08/24 2200  piperacillin -tazobactam (ZOSYN ) IVPB 3.375 g        3.375 g 12.5 mL/hr over 240 Minutes Intravenous Every  8 hours 10/08/24 2050           Assessment/Plan 78 y/o M w/ c/f choledocholithiasis  -U/S from 10/21 showed Dilation of the common bile duct with possible small stones and biliary sludge present. Correlation with serum bilirubin recommended. A follow-up multiphase abdominal MRI with IV contrast is recommended for further characterization. Cholecystolithiasis.  No changes of acute cholecystitis. -MRCP from 10/22 showing cholelithiasis and partially obstructive choledocholithiasis and sludge. 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 -WBC 7.2 and HGB 11.2 -AST 635, ALT 317, Alk phos 486, T bili, 2.6 -GI tentatively planning for ERCP tomorrow 10/23. -Will continue to follow.   FEN: CLD and NPO at midnight VTE: Heparin  infusion; Eliquis  being held. ID: Zosyn     LOS: 1 day   I reviewed specialist notes, hospitalist notes, nursing notes, last 24 h vitals and pain scores, last 48 h intake and output, last 24 h labs and trends, and last 24 h imaging results.  This care required moderate level of medical decision making.    Marjorie Carlyon Favre, [facility] Surgery 10/09/2024, 3:09 PM Please see Amion for pager number during day hours 7:00am-4:30pm

## 2024-10-09 NOTE — CV Procedure (Signed)
  Device system confirmed to be MRI conditional, with implant date > 6 weeks ago, and no evidence of abandoned or epicardial leads in review of most recent CXR  Device last cleared by EP Provider: Charlies Arthur, on 10/09/24  Clearance is good through for 1 year as long as parameters remain stable at time of check. If pt undergoes a cardiac device procedure during that time, they should be re-cleared.   Tachy-therapies to be programmed off if applicable with device back to pre-MRI settings after completion of exam.  [employer] will be present for programming for the MRI.   Tobias LITTIE Leander, RT  10/09/2024 12:49 PM

## 2024-10-09 NOTE — TOC Initial Note (Signed)
 Transition of Care [facility]) - Initial/Assessment Note    Patient Details  Name: Kyle Matthews MRN: [id number] Date of Birth: Sep 12, 1946  [facility] CM/SW Contact:    Jeoffrey LITTIE Moose, [place] Phone Number: 10/09/2024, 8:47 AM  Clinical Narrative:                 Pt admitted from home with spouse due to gallbladder pain, n/v & pain in chest & back. No current TOC needs, please consult as needs arise.        Patient Goals and CMS Choice            Expected Discharge Plan and Services                                              Prior Living Arrangements/Services                       Activities of Daily Living      Permission Sought/Granted                  Emotional Assessment              Admission diagnosis:  Choledocholithiasis [K80.50] Patient Active Problem List   Diagnosis Date Noted   Choledocholithiasis 10/08/2024   Elevated LFTs 10/08/2024   AKI (acute kidney injury) 10/08/2024   Prolonged QT interval 10/08/2024   Perforation of gallbladder in cholecystitis 09/27/2024   Atrial flutter (HCC) 09/13/2024   Recurrent biliary colic 08/21/2024   Symptomatic cholelithiasis 08/20/2024   Atrial fibrillation and flutter (HCC) 08/20/2024   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   S/P [place] Procedure 07/01/2024   History of stroke 06/18/2024   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   S/P AVR (aortic valve replacement) 11/02/2023   Chronic heart failure with preserved ejection fraction (HFpEF) (HCC) 11/01/2023   Benign hypertension 10/30/2023   Atherosclerosis of native coronary artery of native heart without angina pectoris 10/28/2023   GERD (gastroesophageal reflux disease) 10/23/2023   Coronary artery disease involving native coronary artery of native heart with unstable angina pectoris (HCC) 10/10/2023   BPH with urinary obstruction  10/04/2023   Kidney lesion, native, left 10/03/2023   Lesion of right native kidney 10/03/2023   Elevated brain natriuretic peptide (BNP) level 10/03/2023   Coronary artery disease 10/03/2023   Enlarged prostate 10/03/2023   Moderate aortic regurgitation 10/03/2023   Hyperlipidemia 05/20/2017   Nicotine dependence, cigarettes, uncomplicated 05/20/2017   Carotid bruit 05/19/2017   Essential hypertension    RBBB 04/13/2017   PCP:  Espinoza, Alejandra, DO Pharmacy:   [facility] [phone] - [place], [place] - [address] AT SEC OF [address] & [address] [place] [address] Phone: [phone] Fax: [phone]     Social Drivers of Health (SDOH) Social History: SDOH Screenings   Food Insecurity: No Food Insecurity (09/26/2024)  Housing: Low Risk  (09/26/2024)  Transportation Needs: No Transportation Needs (09/26/2024)  Utilities: Not At Risk (09/26/2024)  Depression (PHQ2-9): High Risk (02/12/2024)  Social Connections: Socially Integrated (09/26/2024)  Tobacco Use: High Risk (10/08/2024)   SDOH Interventions:     Readmission Risk Interventions    11/08/2023   10:21 AM 10/25/2023  3:42 PM  Readmission Risk Prevention Plan  Transportation Screening Complete Complete  PCP or Specialist Appt within 3-5 Days  Complete  HRI or Home Care Consult  Complete  Palliative Care Screening  Not Applicable  Medication Review (RN Care Manager)  Complete

## 2024-10-09 NOTE — Plan of Care (Signed)
  Problem: Education: Goal: Knowledge of the procedure and recovery process will improve Outcome: Progressing   Problem: Pain Management: Goal: General experience of comfort will improve Outcome: Progressing   Problem: Skin Integrity: Goal: Demonstration of wound healing without infection will improve Outcome: Progressing   Problem: Urinary Elimination: Goal: Ability to avoid or minimize complications of infection will improve Outcome: Progressing   Problem: Education: Goal: Knowledge of General Education information will improve Description: Including pain rating scale, medication(s)/side effects and non-pharmacologic comfort measures Outcome: Progressing

## 2024-10-09 NOTE — Progress Notes (Signed)
 Pt arrived to the unit, needs met and call bell in reach.

## 2024-10-09 NOTE — Progress Notes (Signed)
 PROGRESS NOTE Kyle Matthews    DOB: [birth year], 78 y.o.  FMW:[id number]    Code Status: Full Code   DOA: 10/08/2024   LOS: 1  Brief hospital course  Kyle Matthews is a 78 y.o. 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    ED Course: afebrile and saturating well on room air with normal HR and stable BP.  Labs are most notable for creatinine 1.30, alkaline phosphatase 421, AST 414, ALT 188, total bilirubin 2.1, and normal WBC.  Ultrasound demonstrates dilated gallbladder to 9.5 mm with suspected small stones and sludge but no evidence for acute cholecystitis.   Surgery and GI were consulted by the ED physician, 1 L LR was administered, and the patient was started on Zosyn .  10/09/24 -MRCP for suspected choledocholithiasis and continued IV Abx   Assessment & Plan  Principal Problem:   Symptomatic cholelithiasis Active Problems:   Essential hypertension   Chronic heart failure with preserved ejection fraction (HFpEF) (HCC)   Atrial fibrillation and flutter (HCC)   Coronary artery disease involving native coronary artery of native heart with unstable angina pectoris (HCC)   History of stroke   Choledocholithiasis   Elevated LFTs   AKI (acute kidney injury)   Prolonged QT interval   Symptomatic cholelithiasis; concern for choledocholithiasis- MRCP results pending - GI following. Likely ERCP tomorrow  - continue Zosyn , repeat labs in am, hold Eliquis  and use IV heparin  for now  - gen surg, likely will need cholecystectomy    AKI- resolved - Likely prerenal in setting of N/V  - Hold Lasix , start gentle IVF hydration, renally-dose medications, repeat chem panel in am     Atrial fibrillation, flutter  - S/p DCCV 09/13/24   - Hold Eliquis , continue IV heparin  for now - continue to hold amio. Rate controlled   CAD  - No anginal complaints     Chronic HFpEF  - EF was normal on echo in August 2025    - Appears compensated  - Hold Lasix  while NPO and vomiting    Hypertension  - Treat as-needed only for now     Prolonged QT interval  - Check magnesium  level, avoid QT-prolonging medications, hold amiodarone  pending repeat EKG in am    Body mass index is 25.65 kg/m.  VTE ppx: heparin    Diet:     Diet   Diet NPO time specified Except for: Ice Chips, Sips with Meds   Diet NPO time specified Except for: Sips with Meds, Ice Chips   Consultants: GI General surgery   Subjective 10/09/24    Pt reports feeling well. His abdominal pain has improved. Feels hungry. No nausea.    Objective  Blood pressure 115/69, pulse 72, temperature 98.2 F (36.8 C), temperature source Oral, resp. rate 16, height 5' 3 (1.6 m), weight 65.7 kg, SpO2 97%.  Intake/Output Summary (Last 24 hours) at 10/09/2024 0753 Last data filed at 10/08/2024 2113 Gross per 24 hour  Intake 1000 ml  Output --  Net 1000 ml   Filed Weights   10/08/24 1742  Weight: 65.7 kg    Physical Exam:  General: awake, alert, NAD HEENT: atraumatic, clear conjunctiva, anicteric sclera, MMM, hearing grossly normal Respiratory: normal respiratory effort. Cardiovascular: extremities well perfused, quick capillary refill, normal S1/S2, RRR, no JVD,  murmurs Gastrointestinal: soft, NT, ND Nervous: A&O x3. no gross focal neurologic deficits, normal speech Extremities: moves all equally, no edema, normal tone Skin: dry, intact, normal temperature, normal color. No rashes, lesions or ulcers on exposed skin Psychiatry: normal mood, congruent affect  Labs   I have personally reviewed the following labs and imaging studies CBC    Component Value Date/Time   WBC 7.2 10/09/2024 0420   RBC 4.39 10/09/2024 0420   HGB 11.2 (L) 10/09/2024 0420   HCT  35.6 (L) 10/09/2024 0420   PLT 209 10/09/2024 0420   MCV 81.1 10/09/2024 0420   MCH 25.5 (L) 10/09/2024 0420   MCHC 31.5 10/09/2024 0420   RDW 15.8 (H) 10/09/2024 0420   LYMPHSABS 0.7 08/20/2024 1201   MONOABS 0.5 08/20/2024 1201   EOSABS 139 09/11/2024 1353   BASOSABS 73 09/11/2024 1353      Latest Ref Rng & Units 10/09/2024    4:20 AM 10/08/2024    5:49 PM 10/03/2024    5:36 AM  BMP  Glucose 70 - 99 mg/dL 74  888  86   BUN 8 - 23 mg/dL 14  15  7    Creatinine 0.61 - 1.24 mg/dL 8.86  8.69  9.18   Sodium 135 - 145 mmol/L 134  134  137   Potassium 3.5 - 5.1 mmol/L 4.1  4.3  2.9   Chloride 98 - 111 mmol/L 101  99  99   CO2 22 - 32 mmol/L 24  23  26    Calcium  8.9 - 10.3 mg/dL 8.8  9.1  8.7     US  Abdomen Limited RUQ (LIVER/GB) Result Date: 10/08/2024 CLINICAL DATA:  151471 RUQ pain 151471 EXAM: ULTRASOUND ABDOMEN LIMITED RIGHT UPPER QUADRANT COMPARISON:  10/02/2024 FINDINGS: Gallbladder: Multiple radiopaque gallstones. No wall thickening or pericholecystic fluid. No sonographic Murphy's sign noted by sonographer. Common bile duct: Diameter: 9.5 mm.  Multiple small stones and biliary sludge. Liver: Normal echogenicity. Septated cyst in the right hepatic lobe measuring 3.1 cm. No intrahepatic biliary ductal dilation. Portal vein is patent on color Doppler imaging with normal direction of blood flow towards the liver. Right Kidney: Partially visualized. 5.2 cm upper pole cyst. No hydronephrosis or nephrolithiasis. Other: None. IMPRESSION: 1. Dilation of the common bile duct with possible small stones and biliary sludge present. Correlation with serum bilirubin recommended. A follow-up multiphase abdominal MRI with IV contrast is recommended for further characterization. 2. Cholecystolithiasis.  No changes of acute cholecystitis. Electronically Signed   By: Rogelia Myers M.D.   On: 10/08/2024 18:41   DG Chest Portable 1 View Result Date: 10/08/2024 CLINICAL DATA:  Chest tightness, back  pain, nausea and vomiting EXAM: PORTABLE CHEST 1 VIEW COMPARISON:  09/30/2024 FINDINGS: Single frontal view of the chest demonstrates stable dual lead pacemaker and postsurgical changes from CABG and aortic valve replacement. The cardiac silhouette is stable. No acute airspace disease, effusion, or pneumothorax. No acute bony abnormalities. IMPRESSION: 1. Stable chest, no acute process. Electronically Signed   By: Ozell Daring M.D.   On: 10/08/2024 18:22    Disposition Plan & Communication  Patient status: Inpatient  Admitted From: Home Planned disposition location: Home Anticipated discharge date: 10/24 pending possible surgical course  Family Communication: none at bedside    Author: Marien LITTIE Piety, DO [facility] 10/09/2024, 7:53 AM   Available by Epic secure chat 7AM-7PM. If 7PM-7AM, please contact night-coverage.  [facility] contact information found on [email or web address].

## 2024-10-09 NOTE — Progress Notes (Signed)
 PHARMACY - ANTICOAGULATION CONSULT NOTE  Pharmacy Consult for heparin  Indication: atrial fibrillation  Allergies  Allergen Reactions   Tetanus Toxoid-Containing Vaccines Other (See Comments)    Childhood Allergy    Zetia  [Ezetimibe ] Diarrhea    Patient Measurements: Height: 5' 3 (160 cm) Weight: 65.7 kg (144 lb 12.8 oz) IBW/kg (Calculated) : 56.9 HEPARIN  DW (KG): 65.7  Vital Signs: Temp: 98.4 F (36.9 C) (10/22 1006) Temp Source: Oral (10/22 1006) BP: 110/59 (10/22 1006) Pulse Rate: 72 (10/22 1006)  Labs: Recent Labs    10/08/24 1749 10/08/24 1950 10/09/24 0420 10/09/24 0814  HGB 12.6*  --  11.2*  --   HCT 40.4  --  35.6*  --   PLT 246  --  209  --   APTT  --   --   --  102*  HEPARINUNFRC  --   --   --  >1.10*  CREATININE 1.30*  --  1.13  --   TROPONINIHS 21* 20*  --   --     Estimated Creatinine Clearance: 44.8 mL/min (by C-G formula based on SCr of 1.13 mg/dL).   Medical History: Past Medical History:  Diagnosis Date   Anemia    Anxiety    Arthritis    BPH (benign prostatic hyperplasia)    Status post biopsy in 2009. - w/ LUTS   Coronary artery disease    a. 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 Patient also with history of atrial fibrillation taking apixaban  PTA. Patient reports last dose of apixaban  was 10/21 AM. Surgery consulted; recommending holding Eliquis  and utilizing heparin  infusion for possibility of procedure.  Plan for MRCP 10/09/2024.    Hgb stable 12.6 from previous 11.5; platelets WNL. Will monitor initially with aPTT given recent DOAC administration.   Heparin  level elevated as expected with recent apixaban  use.  aPTT at upper end of therapeutic goal at 102 seconds on heparin  at 1000 units/hr.  Will empirically reduce rate.  Goal of Therapy:  Heparin  level 0.3-0.7 units/ml aPTT 66-102 seconds Monitor platelets by anticoagulation protocol: Yes   Plan:  Decrease heparin  infusion to 900 units/h.  Check aPTT in 8h. Monitor aPTT, heparin  level, CBC daily.   [employer], Pharm.D., BCPS Clinical Pharmacist Clinical phone for 10/09/2024 from 7:30-3:00 is [phone].  **Pharmacist phone directory can be found on [email or web address] listed under [facility].  10/09/2024 10:12 AM

## 2024-10-09 NOTE — Progress Notes (Signed)
 PHARMACY - ANTICOAGULATION CONSULT NOTE  Pharmacy Consult for heparin  Indication: atrial fibrillation  Allergies  Allergen Reactions   Tetanus Toxoid-Containing Vaccines Other (See Comments)    Childhood Allergy    Zetia  [Ezetimibe ] Diarrhea    Patient Measurements: Height: 5' 3 (160 cm) Weight: 65.7 kg (144 lb 12.8 oz) IBW/kg (Calculated) : 56.9 HEPARIN  DW (KG): 65.7  Vital Signs: Temp: 98.4 F (36.9 C) (10/22 1801) Temp Source: Oral (10/22 1801) BP: 114/65 (10/22 1801) Pulse Rate: 74 (10/22 1801)  Labs: Recent Labs    10/08/24 1749 10/08/24 1950 10/09/24 0420 10/09/24 0814  HGB 12.6*  --  11.2*  --   HCT 40.4  --  35.6*  --   PLT 246  --  209  --   APTT  --   --   --  102*  HEPARINUNFRC  --   --   --  >1.10*  CREATININE 1.30*  --  1.13  --   TROPONINIHS 21* 20*  --   --     Estimated Creatinine Clearance: 44.8 mL/min (by C-G formula based on SCr of 1.13 mg/dL).  Assessment: Patient is a 78 yo male admitted for choledocholithiasis. Patient also with history of atrial fibrillation taking apixaban  PTA. Patient reports last dose of apixaban  was 10/21 AM. Surgery consulted; recommending holding Eliquis  and utilizing heparin  infusion for possibility of procedure.  Plan for MRCP 10/09/2024.    Hgb stable 12.6 from previous 11.5; platelets WNL. Will monitor initially with aPTT given recent DOAC administration.   Called lab and aPTT resulted at 110 sec (supratherapeutic) on 900 units/hr. No bleeding noted. **Lab is trying to release**  Goal of Therapy:  Heparin  level 0.3-0.7 units/ml aPTT 66-102 seconds Monitor platelets by anticoagulation protocol: Yes   Plan:  Decrease heparin  infusion to 750 units/hr Check aPTT in 8hr  Vito Ralph, PharmD, BCPS Please see amion for complete clinical pharmacist phone list 10/09/2024 8:40 PM

## 2024-10-10 DIAGNOSIS — K8020 Calculus of gallbladder without cholecystitis without obstruction: Secondary | ICD-10-CM | POA: Diagnosis not present

## 2024-10-10 DIAGNOSIS — R7989 Other specified abnormal findings of blood chemistry: Secondary | ICD-10-CM | POA: Diagnosis not present

## 2024-10-10 LAB — COMPREHENSIVE METABOLIC PANEL WITH GFR
ALT: 287 U/L — ABNORMAL HIGH (ref 0–44)
AST: 299 U/L — ABNORMAL HIGH (ref 15–41)
Albumin: 2.8 g/dL — ABNORMAL LOW (ref 3.5–5.0)
Alkaline Phosphatase: 454 U/L — ABNORMAL HIGH (ref 38–126)
Anion gap: 11 (ref 5–15)
BUN: 11 mg/dL (ref 8–23)
CO2: 21 mmol/L — ABNORMAL LOW (ref 22–32)
Calcium: 8.6 mg/dL — ABNORMAL LOW (ref 8.9–10.3)
Chloride: 102 mmol/L (ref 98–111)
Creatinine, Ser: 1.03 mg/dL (ref 0.61–1.24)
GFR, Estimated: >60 mL/min (ref >60)
Glucose, Bld: 75 mg/dL (ref 70–99)
Potassium: 4 mmol/L (ref 3.5–5.1)
Sodium: 134 mmol/L — ABNORMAL LOW (ref 135–145)
Total Bilirubin: 1 mg/dL (ref 0.0–1.2)
Total Protein: 6.7 g/dL (ref 6.5–8.1)

## 2024-10-10 LAB — CBC
HCT: 37.4 % — ABNORMAL LOW (ref 39.0–52.0)
Hemoglobin: 11.9 g/dL — ABNORMAL LOW (ref 13.0–17.0)
MCH: 25.8 pg — ABNORMAL LOW (ref 26.0–34.0)
MCHC: 31.8 g/dL (ref 30.0–36.0)
MCV: 81.1 fL (ref 80.0–100.0)
Platelets: 204 K/uL (ref 150–400)
RBC: 4.61 MIL/uL (ref 4.22–5.81)
RDW: 16.1 % — ABNORMAL HIGH (ref 11.5–15.5)
WBC: 6.7 K/uL (ref 4.0–10.5)
nRBC: 0 % (ref 0.0–0.2)

## 2024-10-10 LAB — APTT
aPTT: 97 s — ABNORMAL HIGH (ref 24–36)
aPTT: 93 s — ABNORMAL HIGH (ref 24–36)

## 2024-10-10 LAB — HEPARIN LEVEL (UNFRACTIONATED): Heparin Unfractionated: >1.1 [IU]/mL — ABNORMAL HIGH (ref 0.30–0.70)

## 2024-10-10 MED ORDER — CEFAZOLIN SODIUM-DEXTROSE 2-4 GM/100ML-% IV SOLN
2.0000 g | INTRAVENOUS | Status: DC
  Filled 2024-10-10: qty 100

## 2024-10-10 NOTE — Progress Notes (Addendum)
 Patient ID: Kyle Matthews, male   DOB: 05-14-1946, 78 y.o.   MRN: [id number]    Progress Note   Subjective   Day 3 2 CC; right upper quadrant pain/nausea vomiting in setting of 3 recent admissions with acute cholecystitis  IV heparin  IV Zosyn   MRI/MRCP yesterday cholelithiasis and partially obstructive choledocholithiasis and sludge similar to previous.  Gallbladder containing multiple stones-CBD 8.5 mm containing multiple intraluminal filling defects in the distal common bile duct.  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 bili 1.0/AST 299/ALT 287  Patient says he feels better today, he is not currently having any pain no nausea or vomiting and was able to tolerate some solid food earlier today    Objective   Vital signs in last 24 hours: Temp:  [98.2 F (36.8 C)-98.8 F (37.1 C)] 98.6 F (37 C) (10/23 1057) Pulse Rate:  [67-74] 73 (10/23 1057) Resp:  [16-17] 17 (10/23 1057) BP: (113-122)/(65-74) 113/72 (10/23 1057) SpO2:  [95 %-100 %] 95 % (10/23 1057) Last BM Date : 10/08/24 General:    Elderly white male in NAD Heart:  Regular rate and rhythm; no murmurs Lungs: Respirations even and unlabored, lungs CTA bilaterally Abdomen:  Soft, nontender and nondistended. Normal bowel sounds. Extremities:  Without edema. Neurologic:  Alert and oriented,  grossly normal neurologically. Psych:  Cooperative. Normal mood and affect.  Intake/Output from previous day: 10/22 0701 - 10/23 0700 In: 279 [P.O.:100; I.V.:179] Out: 1300 [Urine:1300] Intake/Output this shift: No intake/output data recorded.  Lab Results: Recent Labs    10/08/24 1749 10/09/24 0420 10/10/24 0522  WBC 10.4 7.2 6.7  HGB 12.6* 11.2* 11.9*  HCT 40.4 35.6* 37.4*  PLT 246 209 204   BMET Recent Labs    10/08/24 1749  10/09/24 0420 10/10/24 0522  NA 134* 134* 134*  K 4.3 4.1 4.0  CL 99 101 102  CO2 23 24 21*  GLUCOSE 111* 74 75  BUN 15 14 11   CREATININE 1.30* 1.13 1.03  CALCIUM  9.1 8.8* 8.6*   LFT Recent Labs    10/10/24 0522  PROT 6.7  ALBUMIN  2.8*  AST 299*  ALT 287*  ALKPHOS 454*  BILITOT 1.0   PT/INR No results for input(s): LABPROT, INR in the last 72 hours.  Studies/Results: MR ABDOMEN MRCP W WO CONTAST Result Date: 10/09/2024 CLINICAL DATA:  Jaundice and cholelithiasis EXAM: MRI ABDOMEN WITHOUT AND WITH CONTRAST (INCLUDING MRCP) TECHNIQUE: Multiplanar multisequence MR imaging of the abdomen was performed both before and after the administration of intravenous contrast. Heavily T2-weighted images of the biliary and pancreatic ducts were obtained, and three-dimensional MRCP images were rendered by post processing. CONTRAST:  7mL GADAVIST GADOBUTROL 1 MMOL/ML IV SOLN COMPARISON:  Ultrasound abdomen October 08, 2024. CT abdomen pelvis September 02, 2024. FINDINGS: Lower chest: No acute abnormality.  Median sternotomy. Hepatobiliary/MRCP/pancreas: Scattered T2 hyperintense liver cysts measuring up to 2.5 cm. Gallbladder contains multiple calculi. Normal wall thickness. Common bile duct is dilated up to 8.5 mm and contains multiple intraluminal filling defects in distal CBD consistent with choledocholithiasis/sludge. No significant intrahepatic biliary dilatation. Bifid configuration of the pancreatic duct with dominant duct of [place] drainage. 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 Additional cystic lesion measuring 1 cm is identified along the pancreatic head abutting the  second portion of duodenum which may represent an exophytic pancreatic cyst. Differentials include duodenal diverticulum (7/54). No abnormal enhancement is identified associated with these lesions. Pancreatic [place] duct  measures 4 mm at the pancreatic head. Pancreatic body and tail duct measures 3 mm. No enhancing pancreatic lesion identified. Spleen: Normal in size without focal abnormality.  Small splenule. Adrenals/Urinary Tract: Adrenal glands are unremarkable. Bilateral simple and complex renal cortical cysts measuring up to 6.3 cm. Some of the cysts demonstrate T1 hyperintense components and hematocrit level suggestive of proteinaceous/hemorrhagic products. No suspicious enhancement or solid component. (Bosniak 2). No hydronephrosis. Stomach/Bowel: Stomach is within normal limits. No evidence of bowel wall thickening, distention, or inflammatory changes. Vascular/Lymphatic: No significant vascular findings are present. No enlarged abdominal or pelvic lymph nodes. Other: No abdominal wall hernia or abnormality. No ascites. Musculoskeletal: No acute or significant osseous findings. IMPRESSION: Cholelithiasis and partially obstructive choledocholithiasis and sludge, similar to prior. 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 Follow-up according to guidelines below. Bilateral simple and complex renal cortical cysts which does not require imaging follow-up, stable. (Bosniak 2) Scattered liver cysts, stable to prior. [place] consensus guideline for intraductal papillary mucinous neoplasms (IPMNs): Optimal imaging surveillance strategies for suspected BD-IPMNs <3 cm and without worrisome features is unclear, but the yearly incidence of transformation to pancreatic cancer is estimated at 0.4-1.1% per year: -Largest cyst less than 1 cm: CT or MRI/MRCP in 2-3 years -Largest cyst 1-2 cm: CT or MRI/MRCP annually for 2 years, then lengthen interval if no change -Largest cyst 2-3 cm: EUS in 6 months, then lengthen interval alternating MRI with EUS as appropriate consider surgery in young patients American Gastroenter[employer] Association recommends stopping surveillance after 5 years if no  significant change is observed or if a cyst is resected and found to be benign This is not a recommendation that is explicitly stated in the [place] 2017 update 3. Electronically Signed   By: Megan  Zare M.D.   On: 10/09/2024 14:31   MR 3D Recon At Scanner Result Date: 10/09/2024 CLINICAL DATA:  Jaundice and cholelithiasis EXAM: MRI ABDOMEN WITHOUT AND WITH CONTRAST (INCLUDING MRCP) TECHNIQUE: Multiplanar multisequence MR imaging of the abdomen was performed both before and after the administration of intravenous contrast. Heavily T2-weighted images of the biliary and pancreatic ducts were obtained, and three-dimensional MRCP images were rendered by post processing. CONTRAST:  7mL GADAVIST GADOBUTROL 1 MMOL/ML IV SOLN COMPARISON:  Ultrasound abdomen October 08, 2024. CT abdomen pelvis September 02, 2024. FINDINGS: Lower chest: No acute abnormality.  Median sternotomy. Hepatobiliary/MRCP/pancreas: Scattered T2 hyperintense liver cysts measuring up to 2.5 cm. Gallbladder contains multiple calculi. Normal wall thickness. Common bile duct is dilated up to 8.5 mm and contains multiple intraluminal filling defects in distal CBD consistent with choledocholithiasis/sludge. No significant intrahepatic biliary dilatation. Bifid configuration of the pancreatic duct with dominant duct of [place] drainage. 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 Additional cystic lesion measuring 1 cm is identified along the pancreatic head abutting the second portion of duodenum which may represent an exophytic pancreatic cyst. Differentials include duodenal diverticulum (7/54). No abnormal enhancement is identified associated with these lesions. Pancreatic [place] duct measures 4 mm at the pancreatic head. Pancreatic body and tail duct measures 3 mm. No enhancing pancreatic lesion identified. Spleen: Normal in size without focal  abnormality.  Small splenule. Adrenals/Urinary Tract: Adrenal glands are unremarkable. Bilateral simple and  complex renal cortical cysts measuring up to 6.3 cm. Some of the cysts demonstrate T1 hyperintense components and hematocrit level suggestive of proteinaceous/hemorrhagic products. No suspicious enhancement or solid component. (Bosniak 2). No hydronephrosis. Stomach/Bowel: Stomach is within normal limits. No evidence of bowel wall thickening, distention, or inflammatory changes. Vascular/Lymphatic: No significant vascular findings are present. No enlarged abdominal or pelvic lymph nodes. Other: No abdominal wall hernia or abnormality. No ascites. Musculoskeletal: No acute or significant osseous findings. IMPRESSION: Cholelithiasis and partially obstructive choledocholithiasis and sludge, similar to prior. 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 Follow-up according to guidelines below. Bilateral simple and complex renal cortical cysts which does not require imaging follow-up, stable. (Bosniak 2) Scattered liver cysts, stable to prior. [place] consensus guideline for intraductal papillary mucinous neoplasms (IPMNs): Optimal imaging surveillance strategies for suspected BD-IPMNs <3 cm and without worrisome features is unclear, but the yearly incidence of transformation to pancreatic cancer is estimated at 0.4-1.1% per year: -Largest cyst less than 1 cm: CT or MRI/MRCP in 2-3 years -Largest cyst 1-2 cm: CT or MRI/MRCP annually for 2 years, then lengthen interval if no change -Largest cyst 2-3 cm: EUS in 6 months, then lengthen interval alternating MRI with EUS as appropriate consider surgery in young patients American Gastroenterological Association recommends stopping surveillance after 5 years if no significant change is observed or if a cyst is resected and found to be benign This is not a recommendation that is explicitly stated in the [place] 2017 update 3.  Electronically Signed   By: Megan  Zare M.D.   On: 10/09/2024 14:31   US  Abdomen Limited RUQ (LIVER/GB) Result Date: 10/08/2024 CLINICAL DATA:  151471 RUQ pain 151471 EXAM: ULTRASOUND ABDOMEN LIMITED RIGHT UPPER QUADRANT COMPARISON:  10/02/2024 FINDINGS: Gallbladder: Multiple radiopaque gallstones. No wall thickening or pericholecystic fluid. No sonographic Murphy's sign noted by sonographer. Common bile duct: Diameter: 9.5 mm.  Multiple small stones and biliary sludge. Liver: Normal echogenicity. Septated cyst in the right hepatic lobe measuring 3.1 cm. No intrahepatic biliary ductal dilation. Portal vein is patent on color Doppler imaging with normal direction of blood flow towards the liver. Right Kidney: Partially visualized. 5.2 cm upper pole cyst. No hydronephrosis or nephrolithiasis. Other: None. IMPRESSION: 1. Dilation of the common bile duct with possible small stones and biliary sludge present. Correlation with serum bilirubin recommended. A follow-up multiphase abdominal MRI with IV contrast is recommended for further characterization. 2. Cholecystolithiasis.  No changes of acute cholecystitis. Electronically Signed   By: Rogelia Myers M.D.   On: 10/08/2024 18:41   DG Chest Portable 1 View Result Date: 10/08/2024 CLINICAL DATA:  Chest tightness, back pain, nausea and vomiting EXAM: PORTABLE CHEST 1 VIEW COMPARISON:  09/30/2024 FINDINGS: Single frontal view of the chest demonstrates stable dual lead pacemaker and postsurgical changes from CABG and aortic valve replacement. The cardiac silhouette is stable. No acute airspace disease, effusion, or pneumothorax. No acute bony abnormalities. IMPRESSION: 1. Stable chest, no acute process. Electronically Signed   By: Ozell Daring M.D.   On: 10/08/2024 18:22       Assessment / Plan:   #25  78 year old male with recurrent right upper quadrant pain nausea and vomiting in setting of recent cholecystitis which was managed nonoperatively with IV  antibiotics as patient had just undergone a cardioversion.  He has had new significant LFT elevation MRI/MRCP yesterday confirms several distal common bile duct stone/sludge and multiple gallstones though no cholecystitis obvious at present.  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 Wilhelmenia Procedure has been discussed in detail with the patient including indications risks and benefits and he is agreeable to proceed. N.p.o. after midnight Continue  IV Zosyn  Continue to trend LFTs  Surgery is following, no definite plans for cholecystectomy this admission per notes.  GI will continue to follow with you   Principal Problem:   Symptomatic cholelithiasis Active Problems:   Essential hypertension   Coronary artery disease involving native coronary artery of native heart with unstable angina pectoris (HCC)   Chronic heart failure with preserved ejection fraction (HFpEF) (HCC)   History of stroke   Atrial fibrillation and flutter (HCC)   Choledocholithiasis   Elevated LFTs   AKI (acute kidney injury)   Prolonged QT interval     LOS: 2 days   Amy EsterwoodPA-C  10/10/2024, 3:06 PM  I have taken an interval history, thoroughly reviewed the chart and examined the patient. I agree with the Advanced Practitioner's note, impression and recommendations, and have recorded additional findings, impressions and recommendations below.  Saw this patient at the same time as my PA I performed a substantive portion of this encounter (>50% time spent),  including a complete performance of the medical decision making.  My additional thoughts are as follows:  He is feeling well without abdominal pain, N/V or fever. LFTs also improving. MRCP shows CBD stones without radiographic signs of cholecystitis.  He was originally planned for an ERCP with our biliary endoscopist Dr. Wilhelmenia this afternoon, but schedule and provider availability required that he be moved to tomorrow morning.  We discussed it with Mr. Minton and he understood and was agreeable.  The surgical service has seen this patient and decided not to proceed with cholecystectomy.  If ERCP is successful in sphincterotomy and stone extraction, this will certainly help the patient feel better, after which antibiotics can likely be discontinued.  My opinion is that he will likely continue to have biliary colic such has been occurring over the last couple of months and that cholecystectomy may yet be necessary.  I will leave this decision to our surgical colleagues.  ERCP with sphincterotomy will reduce, but not eliminate, the risk of future retained stones in the CBD and biliary pancreatitis. _________________  This consultation required a moderate degree of medical decision making due to the nature and complexity of the acute condition(s) being evaluated as well as the patient's medical comorbidities.  Victory LITTIE Brand III Office:[phone]

## 2024-10-10 NOTE — Progress Notes (Signed)
 Pt is A+OX4 Ind with ambulation IV heparin  running at 7.5/therapeutic Pt tolerated full liquid diet/ NPO at midnight ERCP scheduled for 8am tomorrow HR: regular ( CCMD) did call with an ST depression, strip in chart, made doctor aware and an EKG was ordered, Pt denies pain. LS: clear BS + X4 quads BM 10/23 Consent is signed Pt aware he is NPO at midnight.

## 2024-10-10 NOTE — Progress Notes (Signed)
 PHARMACY - ANTICOAGULATION CONSULT NOTE  Pharmacy Consult for heparin  Indication: atrial fibrillation  Allergies  Allergen Reactions   Tetanus Toxoid-Containing Vaccines Other (See Comments)    Childhood Allergy    Zetia  [Ezetimibe ] Diarrhea    Patient Measurements: Height: 5' 3 (160 cm) Weight: 65.7 kg (144 lb 12.8 oz) IBW/kg (Calculated) : 56.9 HEPARIN  DW (KG): 65.7  Vital Signs: Temp: 98.6 F (37 C) (10/23 1057) Temp Source: Oral (10/23 0439) BP: 113/72 (10/23 1057) Pulse Rate: 73 (10/23 1057)  Labs: Recent Labs    10/08/24 1749 10/08/24 1950 10/09/24 0420 10/09/24 0814 10/09/24 0814 10/09/24 1808 10/10/24 0522 10/10/24 1253  HGB 12.6*  --  11.2*  --   --   --  11.9*  --   HCT 40.4  --  35.6*  --   --   --  37.4*  --   PLT 246  --  209  --   --   --  204  --   APTT  --   --   --  102*   < > 110* 97* 93*  HEPARINUNFRC  --   --   --  >1.10*  --   --  >1.10*  --   CREATININE 1.30*  --  1.13  --   --   --  1.03  --   TROPONINIHS 21* 20*  --   --   --   --   --   --    < > = values in this interval not displayed.    Estimated Creatinine Clearance: 49.1 mL/min (by C-G formula based on SCr of 1.03 mg/dL).   Medical History: Past Medical History:  Diagnosis Date   Anemia    Anxiety    Arthritis    BPH (benign prostatic hyperplasia)    Status post biopsy in 2009. - w/ LUTS   Coronary artery disease    a. 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 Patient also with history of atrial fibrillation taking apixaban  PTA. Patient reports last dose of apixaban  was 10/21 AM. Surgery consulted; recommending holding Eliquis  and utilizing heparin  infusion for possibility of procedure.  Plan for MRCP 10/10/2024.    APTT 93 is therapeutic on 750 units/hr.    Goal of Therapy:  Heparin  level 0.3-0.7 units/ml aPTT 66-102 seconds Monitor platelets by anticoagulation protocol: Yes   Plan:  Continue heparin  infusion 750 units/h.  F/u aPTT until correlates with heparin  level  Monitor daily aPTT, heparin  level, CBC, signs/symptoms of bleeding     Jinnie Door, PharmD, BCPS, [facility] Clinical Pharmacist  Please check AMION for all [facility] Pharmacy phone numbers After 10:00 PM, call Main Pharmacy [phone]

## 2024-10-10 NOTE — Progress Notes (Signed)
 PROGRESS NOTE Kyle Matthews    DOB: Feb 10, 1946, 78 y.o.  FMW:[id number]    Code Status: Full Code   DOA: 10/08/2024   LOS: 2  Brief hospital course  Kyle Matthews is a 78 y.o. 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    ED Course: afebrile and saturating well on room air with normal HR and stable BP.  Labs are most notable for creatinine 1.30, alkaline phosphatase 421, AST 414, ALT 188, total bilirubin 2.1, and normal WBC.  Ultrasound demonstrates dilated gallbladder to 9.5 mm with suspected small stones and sludge but no evidence for acute cholecystitis.   Surgery and GI were consulted by the ED physician, 1 L LR was administered, and the patient was started on Zosyn .  10/10/24 -MRCP yesterday showed partial obstruction with cholecystitis. ERCP today. Clinically improved. Will need to discuss if surgical candidate given anticoagulation needs. May be candidate for bili drain  Assessment & Plan  Principal Problem:   Symptomatic cholelithiasis Active Problems:   Essential hypertension   Chronic heart failure with preserved ejection fraction (HFpEF) (HCC)   Atrial fibrillation and flutter (HCC)   Coronary artery disease involving native coronary artery of native heart with unstable angina pectoris (HCC)   History of stroke   Choledocholithiasis   Elevated LFTs   AKI (acute kidney injury)   Prolonged QT interval   Symptomatic cholelithiasis; concern for choledocholithiasis- MRCP results: Cholelithiasis and partially obstructive choledocholithiasis and sludge, similar to prior. - GI following. ERCP today - continue Zosyn , repeat labs in am, hold Eliquis  and use IV heparin  for now  - gen surg, likely will need  cholecystectomy, timing TBD given anticoagulation needs. May be candidate for bili drain. Symptomatically improved.  - advance diet as tolerated post-procedure.    AKI- resolved - Likely prerenal in setting of N/V  - Hold Lasix , start gentle IVF hydration, renally-dose medications, repeat chem panel in am     Atrial fibrillation, flutter  - S/p DCCV 09/13/24  - Hold Eliquis , continue IV heparin  for now - continue to hold amio. 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 Does not require f/u   CAD  - No anginal complaints     Chronic HFpEF  - EF was normal on echo in August 2025    - Appears compensated  - Hold Lasix  while NPO and vomiting    Hypertension- BP WNL   Prolonged QT interval  - Check magnesium  level, avoid QT-prolonging medications, hold amiodarone    Body mass index is 25.65 kg/m.  VTE ppx: heparin    Diet:     Diet   Diet clear liquid Room service appropriate? Yes; Fluid consistency: Thin   Diet NPO time specified Except for: [employer], Sips with Meds   Consultants: GI General surgery   Subjective 10/10/24    Pt reports feeling well. His abdominal pain has improved. Feels hungry. No nausea.    Objective  Blood pressure 115/69, pulse 72, temperature 98.2 F (36.8 C), temperature source Oral, resp. rate 16, height 5' 3 (1.6 m), weight 65.7 kg, SpO2 97%.  Intake/Output Summary (Last 24 hours) at 10/10/2024 0749 Last data filed at 10/10/2024 0600 Gross per 24 hour  Intake 278.99 ml  Output 1300 ml  Net -1021.01  ml   Filed Weights   10/08/24 1742  Weight: 65.7 kg    Physical Exam:  General: awake, alert, NAD HEENT: atraumatic, clear conjunctiva, anicteric sclera, MMM, hearing grossly normal Respiratory: normal respiratory effort. Cardiovascular: extremities well perfused, quick capillary refill, normal S1/S2, RRR, no JVD, murmurs Gastrointestinal: soft, NT, ND Nervous: A&O x3. no gross  focal neurologic deficits, normal speech Extremities: moves all equally, no edema, normal tone Skin: dry, intact, normal temperature, normal color. No rashes, lesions or ulcers on exposed skin Psychiatry: normal mood, congruent affect  Labs   I have personally reviewed the following labs and imaging studies CBC    Component Value Date/Time   WBC 6.7 10/10/2024 0522   RBC 4.61 10/10/2024 0522   HGB 11.9 (L) 10/10/2024 0522   HCT 37.4 (L) 10/10/2024 0522   PLT 204 10/10/2024 0522   MCV 81.1 10/10/2024 0522   MCH 25.8 (L) 10/10/2024 0522   MCHC 31.8 10/10/2024 0522   RDW 16.1 (H) 10/10/2024 0522   LYMPHSABS 0.7 08/20/2024 1201   MONOABS 0.5 08/20/2024 1201   EOSABS 139 09/11/2024 1353   BASOSABS 73 09/11/2024 1353      Latest Ref Rng & Units 10/10/2024    5:22 AM 10/09/2024    4:20 AM 10/08/2024    5:49 PM  BMP  Glucose 70 - 99 mg/dL 75  74  888   BUN 8 - 23 mg/dL 11  14  15    Creatinine 0.61 - 1.24 mg/dL 8.96  8.86  8.69   Sodium 135 - 145 mmol/L 134  134  134   Potassium 3.5 - 5.1 mmol/L 4.0  4.1  4.3   Chloride 98 - 111 mmol/L 102  101  99   CO2 22 - 32 mmol/L 21  24  23    Calcium  8.9 - 10.3 mg/dL 8.6  8.8  9.1     MR ABDOMEN MRCP W WO CONTAST Result Date: 10/09/2024 CLINICAL DATA:  Jaundice and cholelithiasis EXAM: MRI ABDOMEN WITHOUT AND WITH CONTRAST (INCLUDING MRCP) TECHNIQUE: Multiplanar multisequence MR imaging of the abdomen was performed both before and after the administration of intravenous contrast. Heavily T2-weighted images of the biliary and pancreatic ducts were obtained, and three-dimensional MRCP images were rendered by post processing. CONTRAST:  7mL GADAVIST GADOBUTROL 1 MMOL/ML IV SOLN COMPARISON:  Ultrasound abdomen October 08, 2024. CT abdomen pelvis September 02, 2024. FINDINGS: Lower chest: No acute abnormality.  Median sternotomy. Hepatobiliary/MRCP/pancreas: Scattered T2 hyperintense liver cysts measuring up to 2.5 cm. Gallbladder contains multiple  calculi. Normal wall thickness. Common bile duct is dilated up to 8.5 mm and contains multiple intraluminal filling defects in distal CBD consistent with choledocholithiasis/sludge. No significant intrahepatic biliary dilatation. Bifid configuration of the pancreatic duct with dominant duct of [place] drainage. 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 Additional cystic lesion measuring 1 cm is identified along the pancreatic head abutting the second portion of duodenum which may represent an exophytic pancreatic cyst. Differentials include duodenal diverticulum (7/54). No abnormal enhancement is identified associated with these lesions. Pancreatic [place] duct measures 4 mm at the pancreatic head. Pancreatic body and tail duct measures 3 mm. No enhancing pancreatic lesion identified. Spleen: Normal in size without focal abnormality.  Small splenule. Adrenals/Urinary Tract: Adrenal glands are unremarkable. Bilateral simple and complex renal cortical cysts measuring up to 6.3 cm. Some of the cysts demonstrate T1 hyperintense components and hematocrit level suggestive  of proteinaceous/hemorrhagic products. No suspicious enhancement or solid component. (Bosniak 2). No hydronephrosis. Stomach/Bowel: Stomach is within normal limits. No evidence of bowel wall thickening, distention, or inflammatory changes. Vascular/Lymphatic: No significant vascular findings are present. No enlarged abdominal or pelvic lymph nodes. Other: No abdominal wall hernia or abnormality. No ascites. Musculoskeletal: No acute or significant osseous findings. IMPRESSION: Cholelithiasis and partially obstructive choledocholithiasis and sludge, similar to prior. 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 Follow-up according to guidelines below. Bilateral simple and complex renal cortical cysts which does  not require imaging follow-up, stable. (Bosniak 2) Scattered liver cysts, stable to prior. [place] consensus guideline for intraductal papillary mucinous neoplasms (IPMNs): Optimal imaging surveillance strategies for suspected BD-IPMNs <3 cm and without worrisome features is unclear, but the yearly incidence of transformation to pancreatic cancer is estimated at 0.4-1.1% per year: -Largest cyst less than 1 cm: CT or MRI/MRCP in 2-3 years -Largest cyst 1-2 cm: CT or MRI/MRCP annually for 2 years, then lengthen interval if no change -Largest cyst 2-3 cm: EUS in 6 months, then lengthen interval alternating MRI with EUS as appropriate consider surgery in young patients American Gastroenterological Association recommends stopping surveillance after 5 years if no significant change is observed or if a cyst is resected and found to be benign This is not a recommendation that is explicitly stated in the [place] 2017 update 3. Electronically Signed   By: Megan  Zare M.D.   On: 10/09/2024 14:31   MR 3D Recon At Scanner Result Date: 10/09/2024 CLINICAL DATA:  Jaundice and cholelithiasis EXAM: MRI ABDOMEN WITHOUT AND WITH CONTRAST (INCLUDING MRCP) TECHNIQUE: Multiplanar multisequence MR imaging of the abdomen was performed both before and after the administration of intravenous contrast. Heavily T2-weighted images of the biliary and pancreatic ducts were obtained, and three-dimensional MRCP images were rendered by post processing. CONTRAST:  7mL GADAVIST GADOBUTROL 1 MMOL/ML IV SOLN COMPARISON:  Ultrasound abdomen October 08, 2024. CT abdomen pelvis September 02, 2024. FINDINGS: Lower chest: No acute abnormality.  Median sternotomy. Hepatobiliary/MRCP/pancreas: Scattered T2 hyperintense liver cysts measuring up to 2.5 cm. Gallbladder contains multiple calculi. Normal wall thickness. Common bile duct is dilated up to 8.5 mm and contains multiple intraluminal filling defects in distal CBD consistent with  choledocholithiasis/sludge. No significant intrahepatic biliary dilatation. Bifid configuration of the pancreatic duct with dominant duct of [place] drainage. 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 Additional cystic lesion measuring 1 cm is identified along the pancreatic head abutting the second portion of duodenum which may represent an exophytic pancreatic cyst. Differentials include duodenal diverticulum (7/54). No abnormal enhancement is identified associated with these lesions. Pancreatic [place] duct measures 4 mm at the pancreatic head. Pancreatic body and tail duct measures 3 mm. No enhancing pancreatic lesion identified. Spleen: Normal in size without focal abnormality.  Small splenule. Adrenals/Urinary Tract: Adrenal glands are unremarkable. Bilateral simple and complex renal cortical cysts measuring up to 6.3 cm. Some of the cysts demonstrate T1 hyperintense components and hematocrit level suggestive of proteinaceous/hemorrhagic products. No suspicious enhancement or solid component. (Bosniak 2). No hydronephrosis. Stomach/Bowel: Stomach is within normal limits. No evidence of bowel wall thickening, distention, or inflammatory changes. Vascular/Lymphatic: No significant vascular findings are present. No enlarged abdominal or pelvic lymph nodes. Other: No abdominal wall hernia or abnormality. No ascites. Musculoskeletal: No acute or significant osseous findings. IMPRESSION: Cholelithiasis and partially obstructive choledocholithiasis and sludge, similar  to prior. 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 Follow-up according to guidelines below. Bilateral simple and complex renal cortical cysts which does not require imaging follow-up, stable. (Bosniak 2) Scattered liver cysts, stable to prior. [place] consensus guideline for intraductal papillary mucinous  neoplasms (IPMNs): Optimal imaging surveillance strategies for suspected BD-IPMNs <3 cm and without worrisome features is unclear, but the yearly incidence of transformation to pancreatic cancer is estimated at 0.4-1.1% per year: -Largest cyst less than 1 cm: CT or MRI/MRCP in 2-3 years -Largest cyst 1-2 cm: CT or MRI/MRCP annually for 2 years, then lengthen interval if no change -Largest cyst 2-3 cm: EUS in 6 months, then lengthen interval alternating MRI with EUS as appropriate consider surgery in young patients American Gastroenterological Association recommends stopping surveillance after 5 years if no significant change is observed or if a cyst is resected and found to be benign This is not a recommendation that is explicitly stated in the [place] 2017 update 3. Electronically Signed   By: Megan  Zare M.D.   On: 10/09/2024 14:31   US  Abdomen Limited RUQ (LIVER/GB) Result Date: 10/08/2024 CLINICAL DATA:  151471 RUQ pain 151471 EXAM: ULTRASOUND ABDOMEN LIMITED RIGHT UPPER QUADRANT COMPARISON:  10/02/2024 FINDINGS: Gallbladder: Multiple radiopaque gallstones. No wall thickening or pericholecystic fluid. No sonographic Murphy's sign noted by sonographer. Common bile duct: Diameter: 9.5 mm.  Multiple small stones and biliary sludge. Liver: Normal echogenicity. Septated cyst in the right hepatic lobe measuring 3.1 cm. No intrahepatic biliary ductal dilation. Portal vein is patent on color Doppler imaging with normal direction of blood flow towards the liver. Right Kidney: Partially visualized. 5.2 cm upper pole cyst. No hydronephrosis or nephrolithiasis. Other: None. IMPRESSION: 1. Dilation of the common bile duct with possible small stones and biliary sludge present. Correlation with serum bilirubin recommended. A follow-up multiphase abdominal MRI with IV contrast is recommended for further characterization. 2. Cholecystolithiasis.  No changes of acute cholecystitis. Electronically Signed   By: Rogelia Myers  M.D.   On: 10/08/2024 18:41   DG Chest Portable 1 View Result Date: 10/08/2024 CLINICAL DATA:  Chest tightness, back pain, nausea and vomiting EXAM: PORTABLE CHEST 1 VIEW COMPARISON:  09/30/2024 FINDINGS: Single frontal view of the chest demonstrates stable dual lead pacemaker and postsurgical changes from CABG and aortic valve replacement. The cardiac silhouette is stable. No acute airspace disease, effusion, or pneumothorax. No acute bony abnormalities. IMPRESSION: 1. Stable chest, no acute process. Electronically Signed   By: Ozell Daring M.D.   On: 10/08/2024 18:22    Disposition Plan & Communication  Patient status: Inpatient  Admitted From: Home Planned disposition location: Home Anticipated discharge date: 10/24 pending possible surgical course  Family Communication: none at bedside    Author: Marien LITTIE Piety, DO [facility] 10/10/2024, 7:49 AM   Available by Epic secure chat 7AM-7PM. If 7PM-7AM, please contact night-coverage.  [facility] contact information found on [email or web address].

## 2024-10-10 NOTE — Progress Notes (Signed)
 PHARMACY - ANTICOAGULATION CONSULT NOTE  Pharmacy Consult for heparin  Indication: atrial fibrillation  Labs: Recent Labs    10/08/24 1749 10/08/24 1950 10/09/24 0420 10/09/24 0814 10/09/24 1808 10/10/24 0522  HGB 12.6*  --  11.2*  --   --  11.9*  HCT 40.4  --  35.6*  --   --  37.4*  PLT 246  --  209  --   --  204  APTT  --   --   --  102* 110* 97*  HEPARINUNFRC  --   --   --  >1.10*  --  >1.10*  CREATININE 1.30*  --  1.13  --   --  1.03  TROPONINIHS 21* 20*  --   --   --   --    Assessment/Plan:  77yo male therapeutic on heparin  after rate change. Will continue infusion at current rate of 750 units/hr and confirm stable with additional PTT.  Marvetta Dauphin, PharmD, BCPS 10/10/2024 6:38 AM

## 2024-10-10 NOTE — Plan of Care (Signed)
  Problem: Pain Management: Goal: General experience of comfort will improve Outcome: Progressing

## 2024-10-10 NOTE — H&P (View-Only) (Signed)
 Patient ID: Kyle Matthews, male   DOB: 05-14-1946, 78 y.o.   MRN: [id number]    Progress Note   Subjective   Day 3 2 CC; right upper quadrant pain/nausea vomiting in setting of 3 recent admissions with acute cholecystitis  IV heparin  IV Zosyn   MRI/MRCP yesterday cholelithiasis and partially obstructive choledocholithiasis and sludge similar to previous.  Gallbladder containing multiple stones-CBD 8.5 mm containing multiple intraluminal filling defects in the distal common bile duct.  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 bili 1.0/AST 299/ALT 287  Patient says he feels better today, he is not currently having any pain no nausea or vomiting and was able to tolerate some solid food earlier today    Objective   Vital signs in last 24 hours: Temp:  [98.2 F (36.8 C)-98.8 F (37.1 C)] 98.6 F (37 C) (10/23 1057) Pulse Rate:  [67-74] 73 (10/23 1057) Resp:  [16-17] 17 (10/23 1057) BP: (113-122)/(65-74) 113/72 (10/23 1057) SpO2:  [95 %-100 %] 95 % (10/23 1057) Last BM Date : 10/08/24 General:    Elderly white male in NAD Heart:  Regular rate and rhythm; no murmurs Lungs: Respirations even and unlabored, lungs CTA bilaterally Abdomen:  Soft, nontender and nondistended. Normal bowel sounds. Extremities:  Without edema. Neurologic:  Alert and oriented,  grossly normal neurologically. Psych:  Cooperative. Normal mood and affect.  Intake/Output from previous day: 10/22 0701 - 10/23 0700 In: 279 [P.O.:100; I.V.:179] Out: 1300 [Urine:1300] Intake/Output this shift: No intake/output data recorded.  Lab Results: Recent Labs    10/08/24 1749 10/09/24 0420 10/10/24 0522  WBC 10.4 7.2 6.7  HGB 12.6* 11.2* 11.9*  HCT 40.4 35.6* 37.4*  PLT 246 209 204   BMET Recent Labs    10/08/24 1749  10/09/24 0420 10/10/24 0522  NA 134* 134* 134*  K 4.3 4.1 4.0  CL 99 101 102  CO2 23 24 21*  GLUCOSE 111* 74 75  BUN 15 14 11   CREATININE 1.30* 1.13 1.03  CALCIUM  9.1 8.8* 8.6*   LFT Recent Labs    10/10/24 0522  PROT 6.7  ALBUMIN  2.8*  AST 299*  ALT 287*  ALKPHOS 454*  BILITOT 1.0   PT/INR No results for input(s): LABPROT, INR in the last 72 hours.  Studies/Results: MR ABDOMEN MRCP W WO CONTAST Result Date: 10/09/2024 CLINICAL DATA:  Jaundice and cholelithiasis EXAM: MRI ABDOMEN WITHOUT AND WITH CONTRAST (INCLUDING MRCP) TECHNIQUE: Multiplanar multisequence MR imaging of the abdomen was performed both before and after the administration of intravenous contrast. Heavily T2-weighted images of the biliary and pancreatic ducts were obtained, and three-dimensional MRCP images were rendered by post processing. CONTRAST:  7mL GADAVIST GADOBUTROL 1 MMOL/ML IV SOLN COMPARISON:  Ultrasound abdomen October 08, 2024. CT abdomen pelvis September 02, 2024. FINDINGS: Lower chest: No acute abnormality.  Median sternotomy. Hepatobiliary/MRCP/pancreas: Scattered T2 hyperintense liver cysts measuring up to 2.5 cm. Gallbladder contains multiple calculi. Normal wall thickness. Common bile duct is dilated up to 8.5 mm and contains multiple intraluminal filling defects in distal CBD consistent with choledocholithiasis/sludge. No significant intrahepatic biliary dilatation. Bifid configuration of the pancreatic duct with dominant duct of [place] drainage. 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 Additional cystic lesion measuring 1 cm is identified along the pancreatic head abutting the  second portion of duodenum which may represent an exophytic pancreatic cyst. Differentials include duodenal diverticulum (7/54). No abnormal enhancement is identified associated with these lesions. Pancreatic [place] duct  measures 4 mm at the pancreatic head. Pancreatic body and tail duct measures 3 mm. No enhancing pancreatic lesion identified. Spleen: Normal in size without focal abnormality.  Small splenule. Adrenals/Urinary Tract: Adrenal glands are unremarkable. Bilateral simple and complex renal cortical cysts measuring up to 6.3 cm. Some of the cysts demonstrate T1 hyperintense components and hematocrit level suggestive of proteinaceous/hemorrhagic products. No suspicious enhancement or solid component. (Bosniak 2). No hydronephrosis. Stomach/Bowel: Stomach is within normal limits. No evidence of bowel wall thickening, distention, or inflammatory changes. Vascular/Lymphatic: No significant vascular findings are present. No enlarged abdominal or pelvic lymph nodes. Other: No abdominal wall hernia or abnormality. No ascites. Musculoskeletal: No acute or significant osseous findings. IMPRESSION: Cholelithiasis and partially obstructive choledocholithiasis and sludge, similar to prior. 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 Follow-up according to guidelines below. Bilateral simple and complex renal cortical cysts which does not require imaging follow-up, stable. (Bosniak 2) Scattered liver cysts, stable to prior. [place] consensus guideline for intraductal papillary mucinous neoplasms (IPMNs): Optimal imaging surveillance strategies for suspected BD-IPMNs <3 cm and without worrisome features is unclear, but the yearly incidence of transformation to pancreatic cancer is estimated at 0.4-1.1% per year: -Largest cyst less than 1 cm: CT or MRI/MRCP in 2-3 years -Largest cyst 1-2 cm: CT or MRI/MRCP annually for 2 years, then lengthen interval if no change -Largest cyst 2-3 cm: EUS in 6 months, then lengthen interval alternating MRI with EUS as appropriate consider surgery in young patients American Gastro[employer] Association recommends stopping surveillance after 5 years if no  significant change is observed or if a cyst is resected and found to be benign This is not a recommendation that is explicitly stated in the [place] 2017 update 3. Electronically Signed   By: Megan  Zare M.D.   On: 10/09/2024 14:31   MR 3D Recon At Scanner Result Date: 10/09/2024 CLINICAL DATA:  Jaundice and cholelithiasis EXAM: MRI ABDOMEN WITHOUT AND WITH CONTRAST (INCLUDING MRCP) TECHNIQUE: Multiplanar multisequence MR imaging of the abdomen was performed both before and after the administration of intravenous contrast. Heavily T2-weighted images of the biliary and pancreatic ducts were obtained, and three-dimensional MRCP images were rendered by post processing. CONTRAST:  7mL GADAVIST GADOBUTROL 1 MMOL/ML IV SOLN COMPARISON:  Ultrasound abdomen October 08, 2024. CT abdomen pelvis September 02, 2024. FINDINGS: Lower chest: No acute abnormality.  Median sternotomy. Hepatobiliary/MRCP/pancreas: Scattered T2 hyperintense liver cysts measuring up to 2.5 cm. Gallbladder contains multiple calculi. Normal wall thickness. Common bile duct is dilated up to 8.5 mm and contains multiple intraluminal filling defects in distal CBD consistent with choledocholithiasis/sludge. No significant intrahepatic biliary dilatation. Bifid configuration of the pancreatic duct with dominant duct of [place] drainage. 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 Additional cystic lesion measuring 1 cm is identified along the pancreatic head abutting the second portion of duodenum which may represent an exophytic pancreatic cyst. Differentials include duodenal diverticulum (7/54). No abnormal enhancement is identified associated with these lesions. Pancreatic [place] duct measures 4 mm at the pancreatic head. Pancreatic body and tail duct measures 3 mm. No enhancing pancreatic lesion identified. Spleen: Normal in size without focal  abnormality.  Small splenule. Adrenals/Urinary Tract: Adrenal glands are unremarkable. Bilateral simple and  complex renal cortical cysts measuring up to 6.3 cm. Some of the cysts demonstrate T1 hyperintense components and hematocrit level suggestive of proteinaceous/hemorrhagic products. No suspicious enhancement or solid component. (Bosniak 2). No hydronephrosis. Stomach/Bowel: Stomach is within normal limits. No evidence of bowel wall thickening, distention, or inflammatory changes. Vascular/Lymphatic: No significant vascular findings are present. No enlarged abdominal or pelvic lymph nodes. Other: No abdominal wall hernia or abnormality. No ascites. Musculoskeletal: No acute or significant osseous findings. IMPRESSION: Cholelithiasis and partially obstructive choledocholithiasis and sludge, similar to prior. 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 Follow-up according to guidelines below. Bilateral simple and complex renal cortical cysts which does not require imaging follow-up, stable. (Bosniak 2) Scattered liver cysts, stable to prior. [place] consensus guideline for intraductal papillary mucinous neoplasms (IPMNs): Optimal imaging surveillance strategies for suspected BD-IPMNs <3 cm and without worrisome features is unclear, but the yearly incidence of transformation to pancreatic cancer is estimated at 0.4-1.1% per year: -Largest cyst less than 1 cm: CT or MRI/MRCP in 2-3 years -Largest cyst 1-2 cm: CT or MRI/MRCP annually for 2 years, then lengthen interval if no change -Largest cyst 2-3 cm: EUS in 6 months, then lengthen interval alternating MRI with EUS as appropriate consider surgery in young patients American Gastro[employer] Association recommends stopping surveillance after 5 years if no significant change is observed or if a cyst is resected and found to be benign This is not a recommendation that is explicitly stated in the [place] 2017 update 3.  Electronically Signed   By: Megan  Zare M.D.   On: 10/09/2024 14:31   US  Abdomen Limited RUQ (LIVER/GB) Result Date: 10/08/2024 CLINICAL DATA:  151471 RUQ pain 151471 EXAM: ULTRASOUND ABDOMEN LIMITED RIGHT UPPER QUADRANT COMPARISON:  10/02/2024 FINDINGS: Gallbladder: Multiple radiopaque gallstones. No wall thickening or pericholecystic fluid. No sonographic Murphy's sign noted by sonographer. Common bile duct: Diameter: 9.5 mm.  Multiple small stones and biliary sludge. Liver: Normal echogenicity. Septated cyst in the right hepatic lobe measuring 3.1 cm. No intrahepatic biliary ductal dilation. Portal vein is patent on color Doppler imaging with normal direction of blood flow towards the liver. Right Kidney: Partially visualized. 5.2 cm upper pole cyst. No hydronephrosis or nephrolithiasis. Other: None. IMPRESSION: 1. Dilation of the common bile duct with possible small stones and biliary sludge present. Correlation with serum bilirubin recommended. A follow-up multiphase abdominal MRI with IV contrast is recommended for further characterization. 2. Cholecystolithiasis.  No changes of acute cholecystitis. Electronically Signed   By: Rogelia Myers M.D.   On: 10/08/2024 18:41   DG Chest Portable 1 View Result Date: 10/08/2024 CLINICAL DATA:  Chest tightness, back pain, nausea and vomiting EXAM: PORTABLE CHEST 1 VIEW COMPARISON:  09/30/2024 FINDINGS: Single frontal view of the chest demonstrates stable dual lead pacemaker and postsurgical changes from CABG and aortic valve replacement. The cardiac silhouette is stable. No acute airspace disease, effusion, or pneumothorax. No acute bony abnormalities. IMPRESSION: 1. Stable chest, no acute process. Electronically Signed   By: Ozell Daring M.D.   On: 10/08/2024 18:22       Assessment / Plan:   #25  78 year old male with recurrent right upper quadrant pain nausea and vomiting in setting of recent cholecystitis which was managed nonoperatively with IV  antibiotics as patient had just undergone a cardioversion.  He has had new significant LFT elevation MRI/MRCP yesterday confirms several distal common bile duct stone/sludge and multiple gallstones though no cholecystitis obvious at present.  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 Wilhelmenia Procedure has been discussed in detail with the patient including indications risks and benefits and he is agreeable to proceed. N.p.o. after midnight Continue  IV Zosyn  Continue to trend LFTs  Surgery is following, no definite plans for cholecystectomy this admission per notes.  GI will continue to follow with you   Principal Problem:   Symptomatic cholelithiasis Active Problems:   Essential hypertension   Coronary artery disease involving native coronary artery of native heart with unstable angina pectoris (HCC)   Chronic heart failure with preserved ejection fraction (HFpEF) (HCC)   History of stroke   Atrial fibrillation and flutter (HCC)   Choledocholithiasis   Elevated LFTs   AKI (acute kidney injury)   Prolonged QT interval     LOS: 2 days   Amy EsterwoodPA-C  10/10/2024, 3:06 PM  I have taken an interval history, thoroughly reviewed the chart and examined the patient. I agree with the Advanced Practitioner's note, impression and recommendations, and have recorded additional findings, impressions and recommendations below.  Saw this patient at the same time as my PA I performed a substantive portion of this encounter (>50% time spent),  including a complete performance of the medical decision making.  My additional thoughts are as follows:  He is feeling well without abdominal pain, N/V or fever. LFTs also improving. MRCP shows CBD stones without radiographic signs of cholecystitis.  He was originally planned for an ERCP with our biliary endoscopist Dr. Wilhelmenia this afternoon, but schedule and provider availability required that he be moved to tomorrow morning.  We discussed it with Mr. Minton and he understood and was agreeable.  The surgical service has seen this patient and decided not to proceed with cholecystectomy.  If ERCP is successful in sphincterotomy and stone extraction, this will certainly help the patient feel better, after which antibiotics can likely be discontinued.  My opinion is that he will likely continue to have biliary colic such has been occurring over the last couple of months and that cholecystectomy may yet be necessary.  I will leave this decision to our surgical colleagues.  ERCP with sphincterotomy will reduce, but not eliminate, the risk of future retained stones in the CBD and biliary pancreatitis. _________________  This consultation required a moderate degree of medical decision making due to the nature and complexity of the acute condition(s) being evaluated as well as the patient's medical comorbidities.  Victory LITTIE Brand III Office:[phone]

## 2024-10-10 NOTE — Progress Notes (Signed)
 Subjective/Chief Complaint: Less pain   Objective: Vital signs in last 24 hours: Temp:  [98.2 F (36.8 C)-98.8 F (37.1 C)] 98.6 F (37 C) (10/23 1057) Pulse Rate:  [67-74] 73 (10/23 1057) Resp:  [16-17] 17 (10/23 1057) BP: (113-122)/(65-74) 113/72 (10/23 1057) SpO2:  [95 %-100 %] 95 % (10/23 1057) Last BM Date : 10/08/24  Intake/Output from previous day: 10/22 0701 - 10/23 0700 In: 279 [P.O.:100; I.V.:179] Out: 1300 [Urine:1300] Intake/Output this shift: No intake/output data recorded.  Abdomen: less pain to palpation today   Lab Results:  Recent Labs    10/09/24 0420 10/10/24 0522  WBC 7.2 6.7  HGB 11.2* 11.9*  HCT 35.6* 37.4*  PLT 209 204   BMET Recent Labs    10/09/24 0420 10/10/24 0522  NA 134* 134*  K 4.1 4.0  CL 101 102  CO2 24 21*  GLUCOSE 74 75  BUN 14 11  CREATININE 1.13 1.03  CALCIUM  8.8* 8.6*   PT/INR No results for input(s): LABPROT, INR in the last 72 hours. ABG No results for input(s): PHART, HCO3 in the last 72 hours.  Invalid input(s): PCO2, PO2  Studies/Results: MR ABDOMEN MRCP W WO CONTAST Result Date: 10/09/2024 CLINICAL DATA:  Jaundice and cholelithiasis EXAM: MRI ABDOMEN WITHOUT AND WITH CONTRAST (INCLUDING MRCP) TECHNIQUE: Multiplanar multisequence MR imaging of the abdomen was performed both before and after the administration of intravenous contrast. Heavily T2-weighted images of the biliary and pancreatic ducts were obtained, and three-dimensional MRCP images were rendered by post processing. CONTRAST:  7mL GADAVIST GADOBUTROL 1 MMOL/ML IV SOLN COMPARISON:  Ultrasound abdomen October 08, 2024. CT abdomen pelvis September 02, 2024. FINDINGS: Lower chest: No acute abnormality.  Median sternotomy. Hepatobiliary/MRCP/pancreas: Scattered T2 hyperintense liver cysts measuring up to 2.5 cm. Gallbladder contains multiple calculi. Normal wall thickness. Common bile duct is dilated up to 8.5 mm and contains multiple  intraluminal filling defects in distal CBD consistent with choledocholithiasis/sludge. No significant intrahepatic biliary dilatation. Bifid configuration of the pancreatic duct with dominant duct of [place] drainage. 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 Additional cystic lesion measuring 1 cm is identified along the pancreatic head abutting the second portion of duodenum which may represent an exophytic pancreatic cyst. Differentials include duodenal diverticulum (7/54). No abnormal enhancement is identified associated with these lesions. Pancreatic [place] duct measures 4 mm at the pancreatic head. Pancreatic body and tail duct measures 3 mm. No enhancing pancreatic lesion identified. Spleen: Normal in size without focal abnormality.  Small splenule. Adrenals/Urinary Tract: Adrenal glands are unremarkable. Bilateral simple and complex renal cortical cysts measuring up to 6.3 cm. Some of the cysts demonstrate T1 hyperintense components and hematocrit level suggestive of proteinaceous/hemorrhagic products. No suspicious enhancement or solid component. (Bosniak 2). No hydronephrosis. Stomach/Bowel: Stomach is within normal limits. No evidence of bowel wall thickening, distention, or inflammatory changes. Vascular/Lymphatic: No significant vascular findings are present. No enlarged abdominal or pelvic lymph nodes. Other: No abdominal wall hernia or abnormality. No ascites. Musculoskeletal: No acute or significant osseous findings. IMPRESSION: Cholelithiasis and partially obstructive choledocholithiasis and sludge, similar to prior. 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 Follow-up according to guidelines below. Bilateral simple and complex renal cortical cysts which does not require imaging follow-up, stable. (Bosniak 2) Scattered liver cysts, stable to prior.  [place] consensus guideline for intraductal papillary mucinous neoplasms (IPMNs): Optimal imaging surveillance strategies for suspected  BD-IPMNs <3 cm and without worrisome features is unclear, but the yearly incidence of transformation to pancreatic cancer is estimated at 0.4-1.1% per year: -Largest cyst less than 1 cm: CT or MRI/MRCP in 2-3 years -Largest cyst 1-2 cm: CT or MRI/MRCP annually for 2 years, then lengthen interval if no change -Largest cyst 2-3 cm: EUS in 6 months, then lengthen interval alternating MRI with EUS as appropriate consider surgery in young patients American Gastroenterological Association recommends stopping surveillance after 5 years if no significant change is observed or if a cyst is resected and found to be benign This is not a recommendation that is explicitly stated in the [place] 2017 update 3. Electronically Signed   By: Megan  Zare M.D.   On: 10/09/2024 14:31   MR 3D Recon At Scanner Result Date: 10/09/2024 CLINICAL DATA:  Jaundice and cholelithiasis EXAM: MRI ABDOMEN WITHOUT AND WITH CONTRAST (INCLUDING MRCP) TECHNIQUE: Multiplanar multisequence MR imaging of the abdomen was performed both before and after the administration of intravenous contrast. Heavily T2-weighted images of the biliary and pancreatic ducts were obtained, and three-dimensional MRCP images were rendered by post processing. CONTRAST:  7mL GADAVIST GADOBUTROL 1 MMOL/ML IV SOLN COMPARISON:  Ultrasound abdomen October 08, 2024. CT abdomen pelvis September 02, 2024. FINDINGS: Lower chest: No acute abnormality.  Median sternotomy. Hepatobiliary/MRCP/pancreas: Scattered T2 hyperintense liver cysts measuring up to 2.5 cm. Gallbladder contains multiple calculi. Normal wall thickness. Common bile duct is dilated up to 8.5 mm and contains multiple intraluminal filling defects in distal CBD consistent with choledocholithiasis/sludge. No significant intrahepatic biliary dilatation. Bifid configuration of the  pancreatic duct with dominant duct of [place] drainage. 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 Additional cystic lesion measuring 1 cm is identified along the pancreatic head abutting the second portion of duodenum which may represent an exophytic pancreatic cyst. Differentials include duodenal diverticulum (7/54). No abnormal enhancement is identified associated with these lesions. Pancreatic [place] duct measures 4 mm at the pancreatic head. Pancreatic body and tail duct measures 3 mm. No enhancing pancreatic lesion identified. Spleen: Normal in size without focal abnormality.  Small splenule. Adrenals/Urinary Tract: Adrenal glands are unremarkable. Bilateral simple and complex renal cortical cysts measuring up to 6.3 cm. Some of the cysts demonstrate T1 hyperintense components and hematocrit level suggestive of proteinaceous/hemorrhagic products. No suspicious enhancement or solid component. (Bosniak 2). No hydronephrosis. Stomach/Bowel: Stomach is within normal limits. No evidence of bowel wall thickening, distention, or inflammatory changes. Vascular/Lymphatic: No significant vascular findings are present. No enlarged abdominal or pelvic lymph nodes. Other: No abdominal wall hernia or abnormality. No ascites. Musculoskeletal: No acute or significant osseous findings. IMPRESSION: Cholelithiasis and partially obstructive choledocholithiasis and sludge, similar to prior. 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 Follow-up according to guidelines below. Bilateral simple and complex renal cortical cysts which does not require imaging follow-up, stable. (Bosniak 2) Scattered liver cysts, stable to prior. [place] consensus guideline for intraductal papillary mucinous neoplasms (IPMNs): Optimal imaging surveillance strategies for suspected BD-IPMNs <3 cm and without  worrisome features is unclear, but the yearly incidence of transformation to pancreatic cancer is estimated at 0.4-1.1% per year: -Largest cyst less than 1 cm: CT or MRI/MRCP in 2-3 years -Largest cyst 1-2 cm: CT or MRI/MRCP annually for 2 years, then lengthen interval if no change -Largest cyst 2-3 cm: EUS in 6 months, then lengthen interval alternating MRI with EUS  as appropriate consider surgery in young patients American Gastroenterological Association recommends stopping surveillance after 5 years if no significant change is observed or if a cyst is resected and found to be benign This is not a recommendation that is explicitly stated in the [place] 2017 update 3. Electronically Signed   By: Megan  Zare M.D.   On: 10/09/2024 14:31   US  Abdomen Limited RUQ (LIVER/GB) Result Date: 10/08/2024 CLINICAL DATA:  151471 RUQ pain 151471 EXAM: ULTRASOUND ABDOMEN LIMITED RIGHT UPPER QUADRANT COMPARISON:  10/02/2024 FINDINGS: Gallbladder: Multiple radiopaque gallstones. No wall thickening or pericholecystic fluid. No sonographic Murphy's sign noted by sonographer. Common bile duct: Diameter: 9.5 mm.  Multiple small stones and biliary sludge. Liver: Normal echogenicity. Septated cyst in the right hepatic lobe measuring 3.1 cm. No intrahepatic biliary ductal dilation. Portal vein is patent on color Doppler imaging with normal direction of blood flow towards the liver. Right Kidney: Partially visualized. 5.2 cm upper pole cyst. No hydronephrosis or nephrolithiasis. Other: None. IMPRESSION: 1. Dilation of the common bile duct with possible small stones and biliary sludge present. Correlation with serum bilirubin recommended. A follow-up multiphase abdominal MRI with IV contrast is recommended for further characterization. 2. Cholecystolithiasis.  No changes of acute cholecystitis. Electronically Signed   By: Rogelia Myers M.D.   On: 10/08/2024 18:41   DG Chest Portable 1 View Result Date: 10/08/2024 CLINICAL DATA:   Chest tightness, back pain, nausea and vomiting EXAM: PORTABLE CHEST 1 VIEW COMPARISON:  09/30/2024 FINDINGS: Single frontal view of the chest demonstrates stable dual lead pacemaker and postsurgical changes from CABG and aortic valve replacement. The cardiac silhouette is stable. No acute airspace disease, effusion, or pneumothorax. No acute bony abnormalities. IMPRESSION: 1. Stable chest, no acute process. Electronically Signed   By: Ozell Daring M.D.   On: 10/08/2024 18:22    Anti-infectives: Anti-infectives (From admission, onward)    Start     Dose/Rate Route Frequency Ordered Stop   10/11/24 0600  ceFAZolin  (ANCEF ) IVPB 2g/100 mL premix        2 g 200 mL/hr over 30 Minutes Intravenous On call to O.R. 10/10/24 1125 10/12/24 0559   10/08/24 2200  piperacillin -tazobactam (ZOSYN ) IVPB 3.375 g        3.375 g 12.5 mL/hr over 240 Minutes Intravenous Every 8 hours 10/08/24 2050         Assessment/Plan: 78 y/o M w/ c/f choledocholithiasis  -U/S from 10/21 showed Dilation of the common bile duct with possible small stones and biliary sludge present. Correlation with serum bilirubin recommended. A follow-up multiphase abdominal MRI with IV contrast is recommended for further characterization. Cholecystolithiasis.  No changes of acute cholecystitis. -MRCP from 10/22 showing cholelithiasis and partially obstructive choledocholithiasis and sludge. 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 -WBC 7.2 and HGB 11.2 -AST 635, ALT 317, Alk phos 486, T bili, 2.6 -GI tentatively planning for ERCP tomorrow 10/23. -Will continue to follow   LOS: 2 days    Debby LABOR Khanh Tanori 10/10/2024 Low

## 2024-10-11 ENCOUNTER — Encounter (HOSPITAL_COMMUNITY)
Admission: EM | Disposition: A | Discharge status: Home / Self Care | Payer: Self-pay | Source: Home / Self Care | Attending: Internal Medicine

## 2024-10-11 ENCOUNTER — Inpatient Hospital Stay (HOSPITAL_COMMUNITY): Admitting: Anesthesiology

## 2024-10-11 ENCOUNTER — Encounter (HOSPITAL_COMMUNITY): Payer: Self-pay | Admitting: Family Medicine

## 2024-10-11 ENCOUNTER — Inpatient Hospital Stay (HOSPITAL_COMMUNITY)

## 2024-10-11 DIAGNOSIS — K8020 Calculus of gallbladder without cholecystitis without obstruction: Secondary | ICD-10-CM | POA: Diagnosis not present

## 2024-10-11 DIAGNOSIS — I2510 Atherosclerotic heart disease of native coronary artery without angina pectoris: Secondary | ICD-10-CM | POA: Diagnosis not present

## 2024-10-11 DIAGNOSIS — K2970 Gastritis, unspecified, without bleeding: Secondary | ICD-10-CM

## 2024-10-11 DIAGNOSIS — K2960 Other gastritis without bleeding: Secondary | ICD-10-CM

## 2024-10-11 DIAGNOSIS — K5710 Diverticulosis of small intestine without perforation or abscess without bleeding: Secondary | ICD-10-CM

## 2024-10-11 DIAGNOSIS — K2289 Other specified disease of esophagus: Secondary | ICD-10-CM

## 2024-10-11 DIAGNOSIS — I10 Essential (primary) hypertension: Secondary | ICD-10-CM

## 2024-10-11 DIAGNOSIS — Z87891 Personal history of nicotine dependence: Secondary | ICD-10-CM

## 2024-10-11 DIAGNOSIS — K838 Other specified diseases of biliary tract: Secondary | ICD-10-CM | POA: Diagnosis not present

## 2024-10-11 DIAGNOSIS — K8050 Calculus of bile duct without cholangitis or cholecystitis without obstruction: Secondary | ICD-10-CM | POA: Diagnosis not present

## 2024-10-11 DIAGNOSIS — K3189 Other diseases of stomach and duodenum: Secondary | ICD-10-CM

## 2024-10-11 HISTORY — PX: ERCP: SHX5425

## 2024-10-11 LAB — COMPREHENSIVE METABOLIC PANEL WITH GFR
ALT: 208 U/L — ABNORMAL HIGH (ref 0–44)
AST: 139 U/L — ABNORMAL HIGH (ref 15–41)
Albumin: 2.9 g/dL — ABNORMAL LOW (ref 3.5–5.0)
Alkaline Phosphatase: 394 U/L — ABNORMAL HIGH (ref 38–126)
Anion gap: 10 (ref 5–15)
BUN: 9 mg/dL (ref 8–23)
CO2: 23 mmol/L (ref 22–32)
Calcium: 8.7 mg/dL — ABNORMAL LOW (ref 8.9–10.3)
Chloride: 101 mmol/L (ref 98–111)
Creatinine, Ser: 0.98 mg/dL (ref 0.61–1.24)
GFR, Estimated: >60 mL/min (ref >60)
Glucose, Bld: 82 mg/dL (ref 70–99)
Potassium: 3.5 mmol/L (ref 3.5–5.1)
Sodium: 134 mmol/L — ABNORMAL LOW (ref 135–145)
Total Bilirubin: 1 mg/dL (ref 0.0–1.2)
Total Protein: 6.7 g/dL (ref 6.5–8.1)

## 2024-10-11 LAB — CBC
HCT: 37.4 % — ABNORMAL LOW (ref 39.0–52.0)
Hemoglobin: 11.8 g/dL — ABNORMAL LOW (ref 13.0–17.0)
MCH: 25.7 pg — ABNORMAL LOW (ref 26.0–34.0)
MCHC: 31.6 g/dL (ref 30.0–36.0)
MCV: 81.3 fL (ref 80.0–100.0)
Platelets: 237 K/uL (ref 150–400)
RBC: 4.6 MIL/uL (ref 4.22–5.81)
RDW: 16.1 % — ABNORMAL HIGH (ref 11.5–15.5)
WBC: 7.2 K/uL (ref 4.0–10.5)
nRBC: 0 % (ref 0.0–0.2)

## 2024-10-11 LAB — SURGICAL PCR SCREEN
MRSA, PCR: NEGATIVE
Staphylococcus aureus: NEGATIVE

## 2024-10-11 LAB — APTT: aPTT: 59 s — ABNORMAL HIGH (ref 24–36)

## 2024-10-11 LAB — HEPARIN LEVEL (UNFRACTIONATED): Heparin Unfractionated: 0.59 [IU]/mL (ref 0.30–0.70)

## 2024-10-11 SURGERY — ERCP, WITH INTERVENTION IF INDICATED
Anesthesia: General

## 2024-10-11 MED ORDER — LACTATED RINGERS IV SOLN
INTRAVENOUS | Status: AC | PRN
  Administered 2024-10-11: 20 mL/h via INTRAVENOUS

## 2024-10-11 MED ORDER — DICLOFENAC SUPPOSITORY 100 MG
RECTAL | Status: DC | PRN
  Administered 2024-10-11: 100 mg via RECTAL

## 2024-10-11 MED ORDER — ROCURONIUM BROMIDE 10 MG/ML (PF) SYRINGE
PREFILLED_SYRINGE | INTRAVENOUS | Status: DC | PRN
Start: 2024-10-11 — End: 2024-10-11
  Administered 2024-10-11: 50 mg via INTRAVENOUS

## 2024-10-11 MED ORDER — HEPARIN (PORCINE) 25000 UT/250ML-% IV SOLN
800.0000 [IU]/h | INTRAVENOUS | Status: AC
  Administered 2024-10-11: 750 [IU]/h via INTRAVENOUS
  Filled 2024-10-11 (×2): qty 250

## 2024-10-11 MED ORDER — ONDANSETRON HCL 4 MG/2ML IJ SOLN
INTRAMUSCULAR | Status: DC | PRN
  Administered 2024-10-11: 4 mg via INTRAVENOUS

## 2024-10-11 MED ORDER — LIDOCAINE 2% (20 MG/ML) 5 ML SYRINGE
INTRAMUSCULAR | Status: DC | PRN
  Administered 2024-10-11: 80 mg via INTRAVENOUS

## 2024-10-11 MED ORDER — SUGAMMADEX SODIUM 200 MG/2ML IV SOLN
INTRAVENOUS | Status: DC | PRN
  Administered 2024-10-11: 200 mg via INTRAVENOUS

## 2024-10-11 MED ORDER — PHENYLEPHRINE 80 MCG/ML (10ML) SYRINGE FOR IV PUSH (FOR BLOOD PRESSURE SUPPORT)
PREFILLED_SYRINGE | INTRAVENOUS | Status: DC | PRN
  Administered 2024-10-11 (×2): 80 ug via INTRAVENOUS

## 2024-10-11 MED ORDER — DICLOFENAC SUPPOSITORY 100 MG
RECTAL | Status: AC
  Filled 2024-10-11: qty 1

## 2024-10-11 MED ORDER — PROPOFOL 10 MG/ML IV BOLUS
INTRAVENOUS | Status: DC | PRN
  Administered 2024-10-11: 100 mg via INTRAVENOUS

## 2024-10-11 MED ORDER — GLUCAGON HCL RDNA (DIAGNOSTIC) 1 MG IJ SOLR
INTRAMUSCULAR | Status: AC
  Filled 2024-10-11: qty 1

## 2024-10-11 MED ORDER — FENTANYL CITRATE (PF) 100 MCG/2ML IJ SOLN
INTRAMUSCULAR | Status: DC | PRN
  Administered 2024-10-11 (×2): 50 ug via INTRAVENOUS

## 2024-10-11 MED ORDER — LACTATED RINGERS IV SOLN: INTRAVENOUS | Status: DC | PRN

## 2024-10-11 MED ORDER — GLUCAGON HCL RDNA (DIAGNOSTIC) 1 MG IJ SOLR
INTRAMUSCULAR | Status: DC | PRN
Start: 2024-10-11 — End: 2024-10-11
  Administered 2024-10-11 (×2): .25 mg via INTRAVENOUS

## 2024-10-11 MED ORDER — DEXAMETHASONE SODIUM PHOSPHATE 4 MG/ML IJ SOLN
INTRAMUSCULAR | Status: DC | PRN
  Administered 2024-10-11: 5 mg via INTRAVENOUS

## 2024-10-11 MED ORDER — FENTANYL CITRATE (PF) 100 MCG/2ML IJ SOLN
INTRAMUSCULAR | Status: AC
  Filled 2024-10-11: qty 2

## 2024-10-11 MED ORDER — SODIUM CHLORIDE 0.9 % IV SOLN
INTRAVENOUS | Status: DC | PRN
  Administered 2024-10-11: 30 mL

## 2024-10-11 NOTE — Anesthesia Postprocedure Evaluation (Signed)
 Anesthesia Post Note  Patient: Kyle Matthews  Procedure(s) Performed: ERCP, WITH INTERVENTION IF INDICATED     Patient location during evaluation: Endoscopy Anesthesia Type: General Level of consciousness: awake and alert Pain management: pain level controlled Vital Signs Assessment: post-procedure vital signs reviewed and stable Respiratory status: spontaneous breathing, nonlabored ventilation, respiratory function stable and patient connected to nasal cannula oxygen Cardiovascular status: blood pressure returned to baseline and stable Postop Assessment: no apparent nausea or vomiting Anesthetic complications: no   No notable events documented.  Last Vitals:  Vitals:   10/11/24 0920 10/11/24 0930  BP: 122/70 113/70  Pulse: 76 71  Resp: 13 14  Temp:  (!) 36.4 C  SpO2: 96% 98%    Last Pain:  Vitals:   10/11/24 0930  TempSrc: Temporal  PainSc: 0-No pain                 Rome Ade

## 2024-10-11 NOTE — Progress Notes (Signed)
 PHARMACY - ANTICOAGULATION CONSULT NOTE  Pharmacy Consult for heparin  Indication: atrial fibrillation  Allergies  Allergen Reactions   Tetanus Toxoid-Containing Vaccines Other (See Comments)    Childhood Allergy    Zetia  [Ezetimibe ] Diarrhea    Patient Measurements: Height: 5' 3 (160 cm) Weight: 65.8 kg (145 lb) IBW/kg (Calculated) : 56.9 HEPARIN  DW (KG): 65.8  Vital Signs: Temp: 97.5 F (36.4 C) (10/24 0930) Temp Source: Temporal (10/24 0930) BP: 132/70 (10/24 0951) Pulse Rate: 65 (10/24 0951)  Labs: Recent Labs    10/08/24 1749 10/08/24 1950 10/09/24 0420 10/09/24 0814 10/09/24 1808 10/10/24 0522 10/10/24 1253 10/11/24 0326  HGB 12.6*  --  11.2*  --   --  11.9*  --  11.8*  HCT 40.4  --  35.6*  --   --  37.4*  --  37.4*  PLT 246  --  209  --   --  204  --  237  APTT  --   --   --  102*   < > 97* 93* 59*  HEPARINUNFRC  --   --   --  >1.10*  --  >1.10*  --  0.59  CREATININE 1.30*  --  1.13  --   --  1.03  --  0.98  TROPONINIHS 21* 20*  --   --   --   --   --   --    < > = values in this interval not displayed.    Estimated Creatinine Clearance: 51.6 mL/min (by C-G formula based on SCr of 0.98 mg/dL).   Medical History: Past Medical History:  Diagnosis Date   Anemia    Anxiety    Arthritis    BPH (benign prostatic hyperplasia)    Status post biopsy in 2009. - w/ LUTS   Coronary artery disease    a. 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 Patient also with history of atrial fibrillation taking apixaban  PTA. Patient reports last dose of apixaban  was 10/21 AM. Surgery consulted; recommending holding Eliquis  and utilizing heparin  infusion for possibility of procedure.  Plan for MRCP 10/11/2024.    HL 0.59 is not correlating with APTT 59, which is subtherapeutic on 750 units/hr but heparin  was held 30 min prior to level for ERCP. Per GI, ok to restart at 15:00 no bolus.  Goal of Therapy:  Heparin  level 0.3-0.7 units/ml aPTT 66-102 seconds Monitor platelets by anticoagulation protocol: Yes   Plan:  Restart heparin  infusion 750 units/hr at 15:00 no bolus F/u aPTT until correlates with heparin  level  Monitor daily aPTT, heparin  level, CBC, signs/symptoms of bleeding   Jinnie Door, PharmD, BCPS, [facility] Clinical Pharmacist  Please check AMION for all [facility] Pharmacy phone numbers After 10:00 PM, call Main Pharmacy [phone]

## 2024-10-11 NOTE — Op Note (Signed)
 [facility] Patient Name: Kyle Matthews Procedure Date : 10/11/2024 MRN: [id number] Attending MD: Aloha Finner , MD, [id number] Date of Birth: [birth year] CSN: [id number] Age: 78 Admit Type: Inpatient Procedure:                ERCP Indications:              Bile duct stone(s), Abnormal MRCP Providers:                Aloha Finner, MD, Jacquelyn Jaci Pierce,                            RN, Felice Sar, Technician Referring MD:              Medicines:                General Anesthesia, Zosyn  3.375 g IV, Diclofenac                            100 mg rectal, Glucagon 0.5 mg IV Complications:            No immediate complications. Estimated Blood Loss:     Estimated blood loss was minimal. Procedure:                Pre-Anesthesia Assessment:                           - Prior to the procedure, a History and Physical                            was performed, and patient medications and                            allergies were reviewed. The patient's tolerance of                            previous anesthesia was also reviewed. The risks                            and benefits of the procedure and the sedation                            options and risks were discussed with the patient.                            All questions were answered, and informed consent                            was obtained. Prior Anticoagulants: The patient has                            taken heparin , last dose was day of procedure. ASA                            Grade Assessment: III - A patient with severe  systemic disease. After reviewing the risks and                            benefits, the patient was deemed in satisfactory                            condition to undergo the procedure.                           After obtaining informed consent, the scope was                            passed under direct vision. Throughout the                             procedure, the patient's blood pressure, pulse, and                            oxygen saturations were monitored continuously. The                            [employer] D single use                            duodenoscope was introduced through the mouth, and                            used to inject contrast into and used to inject                            contrast into the bile duct. The ERCP was                            accomplished without difficulty. The patient                            tolerated the procedure. Scope In: Scope Out: Findings:      The scout film was normal.      The upper GI tract was traversed under direct vision without detailed       examination. The Z-line was irregular and was found 40 cm from the       incisors. Patchy moderate inflammation characterized by erosions,       erythema and friability was found in the entire examined stomach -       biopsied for HP evaluation. No gross lesions were noted in the duodenal       bulb, in the first portion of the duodenum and in the second portion of       the duodenum. The major papilla was adjacent to a diverticulum. The       major papilla was hidden under a large hood but otherwise normal.      A 0.035 inch x 260 cm straight Hydra Jagwire was passed into the biliary       tree. The traction (standard) sphincterotome was passed over the       guidewire  and the bile duct was then deeply cannulated. Contrast was       injected. I personally interpreted the bile duct images. Ductal flow of       contrast was adequate. Image quality was adequate. Contrast extended to       the hepatic ducts. Opacification of the entire biliary tree except for       the gallbladder was successful. The main bile duct contained filling       defect(s) thought to be a stones and sludge. The main bile duct was       mildly dilated. The largest diameter was 12 mm. A 7 mm biliary       sphincterotomy was made with a  monofilament Hydratome sphincterotome       using ERBE electrocautery. There was self limited oozing from the       sphincterotomy which did not require treatment. I could not make this       region larger due to the large hood and diverticular nature/positining.       To discover objects, the biliary tree was swept with a retrieval balloon       from both the left and right systems. Sludge was swept from the duct.       Multiple stones were removed. No stones remained. An occlusion       cholangiogram was performed that showed no further significant biliary       pathology.      A formal pancreatogram was not performed.      The duodenoscope was withdrawn from the patient. Impression:               - Z-line irregular, 40 cm from the incisors.                           - Gastritis - biopsied.                           - No gross lesions in the duodenal bulb, in the                            first portion of the duodenum and in the second                            portion of the duodenum.                           - The major papilla was adjacent to a diverticulum.                            It was hidden under a hood. Otherwise the major                            papilla appeared normal.                           - A filling defect consistent with a stone and                            sludge was seen on the cholangiogram.                           -  The entire main bile duct was mildly dilated.                           - Choledocholithiasis was found. Complete removal                            was accomplished by biliary sphincterotomy and                            balloon sweeping. Recommendation:           - The patient will be observed post-procedure,                            until all discharge criteria are met.                           - Return patient to hospital ward for ongoing care.                           - Advance diet as tolerated.                           -  Await path results.                           - Observe patient's clinical course.                           - Watch for pancreatitis, bleeding, perforation,                            and cholangitis.                           - Check liver enzymes (AST, ALT, alkaline                            phosphatase, bilirubin) in the morning.                           - Strongly recommend for definitive treatment                            cholecystectomy be considered for this patient if                            he is a candidate. Ultimately, surgical risks up to                            team and patient to decide. If concern for subtotal                            cholecystectomy with leakage, then now he has a  sphincterotomy this could manage or biliary                            stenting could be possible.                           - May restart heparin  @300PM  without bolus and                            monitor.                           - Start PPI 40 mg daily.                           - The findings and recommendations were discussed                            with the patient.                           - The findings and recommendations were discussed                            with the referring physician. Procedure Code(s):        --- Professional ---                           [id number], Endoscopic retrograde                            cholangiopancreatography (ERCP); with removal of                            calculi/debris from biliary/pancreatic duct(s)                           43262, Endoscopic retrograde                            cholangiopancreatography (ERCP); with                            sphincterotomy/papillotomy                           [phone], Endoscopic catheterization of the biliary                            ductal system, radiological supervision and                            interpretation Diagnosis Code(s):        --- Professional  ---                           K22.89, Other specified disease of esophagus  K29.70, Gastritis, unspecified, without bleeding                           R93.2, Abnormal findings on diagnostic imaging of                            liver and biliary tract                           K80.50, Calculus of bile duct without cholangitis                            or cholecystitis without obstruction                           K83.8, Other specified diseases of biliary tract CPT copyright 2022 American [employer]. All rights reserved. The codes documented in this report are preliminary and upon coder review may  be revised to meet current compliance requirements. Aloha Finner, MD 10/11/2024 9:25:10 AM Number of Addenda: 0

## 2024-10-11 NOTE — Plan of Care (Signed)
  Problem: Education: Goal: Knowledge of the procedure and recovery process will improve Outcome: Progressing   Problem: Bowel/Gastric: Goal: Gastrointestinal status for postoperative course will improve Outcome: Progressing   Problem: Pain Management: Goal: General experience of comfort will improve Outcome: Progressing

## 2024-10-11 NOTE — Progress Notes (Addendum)
 PROGRESS NOTE Kyle Matthews    DOB: Oct 04, 1946, 78 y.o.  FMW:[id number]    Code Status: Full Code   DOA: 10/08/2024   LOS: 3  Brief hospital course  Kyle Matthews is a 78 y.o. 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    ED Course: afebrile and saturating well on room air with normal HR and stable BP.  Labs are most notable for creatinine 1.30, alkaline phosphatase 421, AST 414, ALT 188, total bilirubin 2.1, and normal WBC.  Ultrasound demonstrates dilated gallbladder to 9.5 mm with suspected small stones and sludge but no evidence for acute cholecystitis.   Surgery and GI were consulted by the ED physician, 1 L LR was administered, and the patient was started on Zosyn .  10/11/24 -MRCP yesterday showed partial obstruction with cholecystitis. ERCP today. Clinically improved. Will need to discuss if surgical candidate given anticoagulation needs. May be candidate for bili drain  Assessment & Plan  Principal Problem:   Symptomatic cholelithiasis Active Problems:   Essential hypertension   Coronary artery disease involving native coronary artery of native heart with unstable angina pectoris (HCC)   Chronic heart failure with preserved ejection fraction (HFpEF) (HCC)   History of stroke   Atrial fibrillation and flutter (HCC)   Choledocholithiasis   Elevated LFTs   AKI (acute kidney injury)   Prolonged QT interval   Erosive gastritis   Dilated bile duct   Symptomatic cholelithiasis; concern for choledocholithiasis- MRCP results: Cholelithiasis and partially obstructive choledocholithiasis and sludge, similar to prior. - GI following. ERCP today - continue Zosyn , repeat labs in am, hold Eliquis  and use IV heparin  for now   - gen surg, likely will need cholecystectomy, timing TBD given anticoagulation needs. May be candidate for bili drain. Symptomatically improved.  - advance diet as tolerated post-procedure.    AKI- resolved - Likely prerenal in setting of N/V  - Hold Lasix , start gentle IVF hydration, renally-dose medications, repeat chem panel in am     Atrial fibrillation, flutter  - S/p DCCV 09/13/24  - Hold Eliquis , continue IV heparin  for now - continue to hold amio. 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 Does not require f/u   CAD  - No anginal complaints     Chronic HFpEF  - EF was normal on echo in August 2025    - Appears compensated  - Hold Lasix  while NPO and vomiting    Hypertension- BP WNL   Prolonged QT interval  - Check magnesium  level, avoid QT-prolonging medications, hold amiodarone    Body mass index is 25.69 kg/m.  VTE ppx: heparin    Diet:     Diet   Diet full liquid Room service appropriate? Yes; Fluid consistency: Thin   Diet NPO time specified Except for: [employer], Sips with Meds   Consultants: GI General surgery   Subjective 10/11/24    Pt reports feeling well. His abdominal pain has improved. Feels hungry. No nausea.    Objective  Blood pressure 115/69, pulse 72, temperature 98.2 F (36.8 C), temperature source Oral, resp. rate 16, height 5' 3 (1.6 m), weight 65.7 kg, SpO2 97%.  Intake/Output Summary (Last 24 hours) at 10/11/2024 1646 Last data filed at 10/11/2024 1500 Gross per 24 hour  Intake  666.66 ml  Output 5 ml  Net 661.66 ml   Filed Weights   10/08/24 1742 10/11/24 0725  Weight: 65.7 kg 65.8 kg    Physical Exam:  General: awake, alert, NAD HEENT: atraumatic, clear conjunctiva, anicteric sclera, MMM, hearing grossly normal Respiratory: normal respiratory effort. Cardiovascular: extremities well perfused, quick capillary refill, normal S1/S2, RRR, no JVD,  murmurs Gastrointestinal: soft, NT, ND Nervous: A&O x3. no gross focal neurologic deficits, normal speech Extremities: moves all equally, no edema, normal tone Skin: dry, intact, normal temperature, normal color. No rashes, lesions or ulcers on exposed skin Psychiatry: normal mood, congruent affect  Labs   I have personally reviewed the following labs and imaging studies CBC    Component Value Date/Time   WBC 7.2 10/11/2024 0326   RBC 4.60 10/11/2024 0326   HGB 11.8 (L) 10/11/2024 0326   HCT 37.4 (L) 10/11/2024 0326   PLT 237 10/11/2024 0326   MCV 81.3 10/11/2024 0326   MCH 25.7 (L) 10/11/2024 0326   MCHC 31.6 10/11/2024 0326   RDW 16.1 (H) 10/11/2024 0326   LYMPHSABS 0.7 08/20/2024 1201   MONOABS 0.5 08/20/2024 1201   EOSABS 139 09/11/2024 1353   BASOSABS 73 09/11/2024 1353      Latest Ref Rng & Units 10/11/2024    3:26 AM 10/10/2024    5:22 AM 10/09/2024    4:20 AM  BMP  Glucose 70 - 99 mg/dL 82  75  74   BUN 8 - 23 mg/dL 9  11  14    Creatinine 0.61 - 1.24 mg/dL 9.01  8.96  8.86   Sodium 135 - 145 mmol/L 134  134  134   Potassium 3.5 - 5.1 mmol/L 3.5  4.0  4.1   Chloride 98 - 111 mmol/L 101  102  101   CO2 22 - 32 mmol/L 23  21  24    Calcium  8.9 - 10.3 mg/dL 8.7  8.6  8.8     DG C-Arm 1-60 Min-No Report Result Date: 10/11/2024 Fluoroscopy was utilized by the requesting physician.  No radiographic interpretation.    Disposition Plan & Communication  Patient status: Inpatient  Admitted From: Home Planned disposition location: Home Anticipated discharge date: 10/24 pending possible surgical course  Family Communication: none at bedside    Author: Leatrice LILLETTE Chapel, MD [facility] 10/11/2024, 4:46 PM   Available by Epic secure chat 7AM-7PM. If 7PM-7AM, please contact night-coverage.  [facility] contact information found on [email or web address].

## 2024-10-11 NOTE — Care Management Important Message (Signed)
 Important Message  Patient Details  Name: Kyle Matthews MRN: [id number] Date of Birth: Feb 09, 1946   Important Message Given:  Yes - Medicare IM     Jon Cruel 10/11/2024, 4:18 PM

## 2024-10-11 NOTE — Anesthesia Preprocedure Evaluation (Signed)
 Anesthesia Evaluation  Patient identified by MRN, date of birth, ID band Patient awake    Reviewed: Allergy & Precautions, H&P , NPO status , Patient's Chart, lab work & pertinent test results  History of Anesthesia Complications (+) history of anesthetic complications  Airway Mallampati: II   Neck ROM: full    Dental  (+) Teeth Intact   Pulmonary Patient abstained from smoking.Not current smoker, former smoker   breath sounds clear to auscultation       Cardiovascular Exercise Tolerance: Good hypertension, (-) angina + CAD, + Past MI, + CABG and +CHF  + dysrhythmias Atrial Fibrillation + pacemaker  Rhythm:regular Rate:Normal  IMPRESSIONS     1. 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 Mean  TV gradient 4 mmHg. Trivial tricuspid regurgitation with grossly normal  prosthetic function. The tricuspid valve is has been repaired/replaced.   2. The aortic valve has been repaired/replaced. Aortic valve  regurgitation is not visualized. There is a 21 mm KONECT RESILIA  pericardial valve graft present in the aortic position. Procedure Date:  06/26/2024. Mean AV gradient 7.7 mmHg suggesting normal  function. Dimentionless index 0.68.   3. Left pleural effusion present, likely at least moderate based on  available images.   4. Right ventricular systolic function is low normal. The right  ventricular size is normal. Tricuspid regurgitation signal is inadequate  for assessing PA pressure. Device wire present.   5. The mitral valve is degenerative. Moderate mitral valve regurgitation.  The mean mitral valve gradient is 4.0 mmHg. Moderate mitral annular  calcification.   6. Aortic dilatation noted. There is borderline dilatation of the aortic  root, measuring 40 mm.   7. Left ventricular ejection fraction, by estimation, is 60 to 65%. Left  ventricular ejection fraction by 3D volume is 56 %. The left ventricle has   normal function. The left ventricle demonstrates regional wall motion  abnormalities (see scoring  diagram/findings for description). Left ventricular diastolic parameters  are indeterminate.   8. The inferior vena cava is dilated in size with <50% respiratory  variability, suggesting right atrial pressure of 15 mmHg.     Neuro/Psych  PSYCHIATRIC DISORDERS Anxiety Depression    CVA, No Residual Symptoms    GI/Hepatic ,GERD  ,,  Endo/Other    Renal/GU      Musculoskeletal  (+) Arthritis ,    Abdominal   Peds  Hematology   Anesthesia Other Findings Past Medical History: No date: Anemia No date: Anxiety No date: Arthritis No date: BPH (benign prostatic hyperplasia)     Comment:  Status post biopsy in 2009. - w/ LUTS No date: Coronary artery disease     Comment:  a. 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 04/13/2017: Severe aortic stenosis 2007: Stroke [facility])     Comment:  No true etiology noted No date: UTI (urinary tract infection)  Reproductive/Obstetrics                              Anesthesia Physical Anesthesia Plan  ASA: 3  Anesthesia Plan: General   Post-op Pain Management:    Induction: Intravenous  PONV Risk Score and Plan: 2 and Treatment may vary due to age or medical condition, Ondansetron  and Dexamethasone   Airway Management Planned: Oral ETT  Additional  Equipment: None  Intra-op Plan:   Post-operative Plan: Extubation in OR  Informed Consent: I have reviewed the patients History and Physical, chart, labs and discussed the procedure including the risks, benefits and alternatives for the proposed anesthesia with the patient or authorized representative who has indicated his/her understanding and acceptance.     Dental advisory given  Plan Discussed with: CRNA, Anesthesiologist and Surgeon  Anesthesia Plan Comments: (Patient denies N/V for past few days.  Discussed risks of anesthesia with patient, including PONV, sore throat, lip/dental/eye damage. Rare risks discussed as well, such as cardiorespiratory and neurological sequelae, and allergic reactions. Discussed the role of CRNA in patient's perioperative care. Patient understands.)         Anesthesia Quick Evaluation

## 2024-10-11 NOTE — Interval H&P Note (Signed)
 History and Physical Interval Note:  10/11/2024 6:25 AM  Kyle Matthews  has presented today for surgery, with the diagnosis of CBD stones.  The various methods of treatment have been discussed with the patient and family. After consideration of risks, benefits and other options for treatment, the patient has consented to  Procedure(s): ERCP, WITH INTERVENTION IF INDICATED (N/A) as a surgical intervention.  The patient's history has been reviewed, patient examined, no change in status, stable for surgery.  I have reviewed the patient's chart and labs.  Questions were answered to the patient's satisfaction.    The risks of an ERCP were discussed at length, including but not limited to the risk of perforation, bleeding, abdominal pain, post-ERCP pancreatitis (while usually mild can be severe and even life threatening).    [name]

## 2024-10-11 NOTE — Transfer of Care (Signed)
 Immediate Anesthesia Transfer of Care Note  Patient: Kyle Matthews  Procedure(s) Performed: ERCP, WITH INTERVENTION IF INDICATED  Patient Location: PACU  Anesthesia Type:General  Level of Consciousness: awake, alert , oriented, and patient cooperative  Airway & Oxygen Therapy: Patient Spontanous Breathing  Post-op Assessment: Report given to RN, Post -op Vital signs reviewed and stable, and Patient moving all extremities X 4  Post vital signs: Reviewed and stable  Last Vitals:  Vitals Value Taken Time  BP 121/77 10/11/24 09:13  Temp    Pulse 78 10/11/24 09:14  Resp 10 10/11/24 09:14  SpO2 99 % 10/11/24 09:14  Vitals shown include unfiled device data.  Last Pain:  Vitals:   10/11/24 0725  TempSrc: Temporal  PainSc: 0-No pain         Complications: No notable events documented.

## 2024-10-11 NOTE — Anesthesia Procedure Notes (Signed)
 Procedure Name: Intubation Date/Time: 10/11/2024 8:12 AM  Performed by: Dianne Burnard RAMAN, CRNAPre-anesthesia Checklist: Patient identified, Emergency Drugs available, Suction available and Patient being monitored Patient Re-evaluated:Patient Re-evaluated prior to induction Oxygen Delivery Method: Circle system utilized Preoxygenation: Pre-oxygenation with 100% oxygen Induction Type: IV induction Ventilation: Mask ventilation without difficulty Laryngoscope Size: Miller and 3 Grade View: Grade I Tube type: Oral Tube size: 7.5 mm Number of attempts: 1 Airway Equipment and Method: Stylet and Oral airway Placement Confirmation: ETT inserted through vocal cords under direct vision, positive ETCO2 and breath sounds checked- equal and bilateral Secured at: 22 cm Tube secured with: Tape Dental Injury: Teeth and Oropharynx as per pre-operative assessment

## 2024-10-12 ENCOUNTER — Other Ambulatory Visit: Payer: Self-pay

## 2024-10-12 DIAGNOSIS — K8050 Calculus of bile duct without cholangitis or cholecystitis without obstruction: Secondary | ICD-10-CM

## 2024-10-12 DIAGNOSIS — K8020 Calculus of gallbladder without cholecystitis without obstruction: Secondary | ICD-10-CM | POA: Diagnosis not present

## 2024-10-12 DIAGNOSIS — R7989 Other specified abnormal findings of blood chemistry: Secondary | ICD-10-CM | POA: Diagnosis not present

## 2024-10-12 LAB — COMPREHENSIVE METABOLIC PANEL WITH GFR
ALT: 218 U/L — ABNORMAL HIGH (ref 0–44)
AST: 170 U/L — ABNORMAL HIGH (ref 15–41)
Albumin: 2.7 g/dL — ABNORMAL LOW (ref 3.5–5.0)
Alkaline Phosphatase: 515 U/L — ABNORMAL HIGH (ref 38–126)
Anion gap: 9 (ref 5–15)
BUN: 13 mg/dL (ref 8–23)
CO2: 24 mmol/L (ref 22–32)
Calcium: 8.8 mg/dL — ABNORMAL LOW (ref 8.9–10.3)
Chloride: 103 mmol/L (ref 98–111)
Creatinine, Ser: 0.99 mg/dL (ref 0.61–1.24)
GFR, Estimated: >60 mL/min (ref >60)
Glucose, Bld: 138 mg/dL — ABNORMAL HIGH (ref 70–99)
Potassium: 4.1 mmol/L (ref 3.5–5.1)
Sodium: 136 mmol/L (ref 135–145)
Total Bilirubin: 0.9 mg/dL (ref 0.0–1.2)
Total Protein: 6.6 g/dL (ref 6.5–8.1)

## 2024-10-12 LAB — CBC
HCT: 36.3 % — ABNORMAL LOW (ref 39.0–52.0)
Hemoglobin: 11.6 g/dL — ABNORMAL LOW (ref 13.0–17.0)
MCH: 25.6 pg — ABNORMAL LOW (ref 26.0–34.0)
MCHC: 32 g/dL (ref 30.0–36.0)
MCV: 80.1 fL (ref 80.0–100.0)
Platelets: 200 K/uL (ref 150–400)
RBC: 4.53 MIL/uL (ref 4.22–5.81)
RDW: 16 % — ABNORMAL HIGH (ref 11.5–15.5)
WBC: 5.3 K/uL (ref 4.0–10.5)
nRBC: 0 % (ref 0.0–0.2)

## 2024-10-12 LAB — HEPARIN LEVEL (UNFRACTIONATED)
Heparin Unfractionated: 0.4 [IU]/mL (ref 0.30–0.70)
Heparin Unfractionated: 0.33 [IU]/mL (ref 0.30–0.70)
Heparin Unfractionated: 0.36 [IU]/mL (ref 0.30–0.70)

## 2024-10-12 LAB — APTT: aPTT: 72 s — ABNORMAL HIGH (ref 24–36)

## 2024-10-12 NOTE — Progress Notes (Signed)
 PROGRESS NOTE Kyle Matthews    DOB: 1946/02/14, 78 y.o.  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  Patient was admitted with recurrent upper abdominal pain, nausea, and vomiting.  Input from the GI and surgery team is highly appreciated.  Surgery team is deciding timing of possible cholecystectomy.   MRCP revealed partial obstruction with cholecystitis. ERCP today. Clinically improved. Will need to discuss if surgical candidate given anticoagulation needs. May be candidate for bili drain.  ERCP revealed: Impression:       - Z-line irregular, 40 cm from the incisors.                           - Gastritis - biopsied.                           - No gross lesions in the duodenal bulb, in the                            first portion of the duodenum and in the second                            portion of the duodenum.                           - The major papilla was adjacent to a diverticulum.                            It was hidden under a hood. Otherwise the major                            papilla appeared normal.                           - A filling defect consistent with a stone and                            sludge was seen on the cholangiogram.                           - The entire main bile duct was mildly dilated.                           - Choledocholithiasis was found. Complete removal                            was accomplished by biliary sphincterotomy and                            balloon sweeping.  10/12/2024: Patient seen.  No new complaints.  No abdominal pain.  No nausea or vomiting.  Awaiting  surgery input.  Assessment & Plan  Principal  Problem:   Symptomatic cholelithiasis Active Problems:   Essential hypertension   Coronary artery disease involving native coronary artery of native heart with unstable angina pectoris (HCC)   Chronic heart failure with preserved ejection fraction (HFpEF) (HCC)   History of stroke   Atrial fibrillation and flutter (HCC)   Choledocholithiasis   Elevated LFTs   AKI (acute kidney injury)   Prolonged QT interval   Erosive gastritis   Dilated bile duct   Symptomatic cholelithiasis; concern for choledocholithiasis- MRCP results: Cholelithiasis and partially obstructive choledocholithiasis and sludge, similar to prior. - GI following. ERCP today - continue Zosyn , repeat labs in am, hold Eliquis  and use IV heparin  for now  - gen surg, likely will need cholecystectomy, timing TBD given anticoagulation needs. May be candidate for bili drain. Symptomatically improved.  - advance diet as tolerated post-procedure.  10/12/2024: Awaiting input from the surgical team.   AKI- resolved - Likely prerenal in setting of N/V  - Hold Lasix , start gentle IVF hydration, renally-dose medications, repeat chem panel in am     Atrial fibrillation, flutter  - S/p DCCV 09/13/24  - Hold Eliquis , continue IV heparin  for now - continue to hold amio. 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 Does not require f/u   CAD  - No anginal complaints     Chronic HFpEF  - EF was normal on echo in August 2025    - Appears compensated  - Hold Lasix  while NPO and vomiting    Hypertension- BP WNL   Prolonged QT interval  - Check magnesium  level, avoid QT-prolonging medications, hold amiodarone    Body mass index is 25.69 kg/m.  VTE ppx: heparin    Diet:     Diet   Diet NPO time specified Except for: [employer], Sips with Meds   DIET SOFT Room service appropriate? Yes; Fluid consistency: Thin   Consultants: GI General surgery   Subjective 10/12/24     No complaints   Objective    General: awake, alert, not in any distress. HEENT: Mild pallor.   Respiratory: Clear to auscultation. Cardiovascular: S1-S2.   Gastrointestinal: Soft and nontender.   Nervous: Awake and alert.  Labs   I have personally reviewed the following labs and imaging studies CBC    Component Value Date/Time   WBC 5.3 10/12/2024 0249   RBC 4.53 10/12/2024 0249   HGB 11.6 (L) 10/12/2024 0249   HCT 36.3 (L) 10/12/2024 0249   PLT 200 10/12/2024 0249   MCV 80.1 10/12/2024 0249   MCH 25.6 (L) 10/12/2024 0249   MCHC 32.0 10/12/2024 0249   RDW 16.0 (H) 10/12/2024 0249   LYMPHSABS 0.7 08/20/2024 1201   MONOABS 0.5 08/20/2024 1201   EOSABS 139 09/11/2024 1353   BASOSABS 73 09/11/2024 1353      Latest Ref Rng & Units 10/12/2024    2:49 AM 10/11/2024    3:26 AM 10/10/2024    5:22 AM  BMP  Glucose 70 - 99 mg/dL 861  82  75   BUN 8 - 23 mg/dL 13  9  11    Creatinine 0.61 - 1.24 mg/dL 9.00  9.01  8.96   Sodium 135 - 145 mmol/L 136  134  134   Potassium 3.5 - 5.1 mmol/L 4.1  3.5  4.0   Chloride 98 - 111 mmol/L 103  101  102   CO2 22 - 32 mmol/L 24  23  21   Calcium  8.9 - 10.3 mg/dL 8.8  8.7  8.6     DG C-Arm 1-60 Min-No Report Result Date: 10/11/2024 Fluoroscopy was utilized by the requesting physician.  No radiographic interpretation.    Disposition Plan & Communication  Patient status: Inpatient  Admitted From: Home Planned disposition location: Home Anticipated discharge date: 10/24 pending possible surgical course  Family Communication: none at bedside    Author: Leatrice LILLETTE Chapel, MD [facility] 10/12/2024, 8:07 PM   Available by Epic secure chat 7AM-7PM. If 7PM-7AM, please contact night-coverage.  [facility] contact information found on [email or web address].

## 2024-10-12 NOTE — Progress Notes (Signed)
 PHARMACY - ANTICOAGULATION CONSULT NOTE  Pharmacy Consult for heparin  Indication: atrial fibrillation  Allergies  Allergen Reactions   Tetanus Toxoid-Containing Vaccines Other (See Comments)    Childhood Allergy    Zetia  [Ezetimibe ] Diarrhea    Patient Measurements: Height: 5' 3 (160 cm) Weight: 65.8 kg (145 lb) IBW/kg (Calculated) : 56.9 HEPARIN  DW (KG): 65.8  Vital Signs: Temp: 98 F (36.7 C) (10/25 1939) Temp Source: Oral (10/25 1810) BP: 121/74 (10/25 1939) Pulse Rate: 66 (10/25 1939)  Labs: Recent Labs    10/10/24 0522 10/10/24 1253 10/11/24 0326 10/12/24 0249 10/12/24 1051 10/12/24 2015  HGB 11.9*  --  11.8* 11.6*  --   --   HCT 37.4*  --  37.4* 36.3*  --   --   PLT 204  --  237 200  --   --   APTT 97* 93* 59* 72*  --   --   HEPARINUNFRC >1.10*  --  0.59 0.40 0.33 0.36  CREATININE 1.03  --  0.98 0.99  --   --     Estimated Creatinine Clearance: 51.1 mL/min (by C-G formula based on SCr of 0.99 mg/dL).   Medical History: Past Medical History:  Diagnosis Date   Anemia    Anxiety    Arthritis    BPH (benign prostatic hyperplasia)    Status post biopsy in 2009. - w/ LUTS   Coronary artery disease    a. 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 Patient also with history of atrial  fibrillation taking apixaban  PTA. Patient reports last dose of apixaban  was 10/21 AM. Surgery consulted; recommending holding Eliquis  and utilizing heparin  infusion for possibility of procedure.  Plan for MRCP 10/12/2024.    Heparin  level remains therapeutic.  Goal of Therapy:  Heparin  level 0.3-0.7 units/ml aPTT 66-102 seconds Monitor platelets by anticoagulation protocol: Yes   Plan:  Heparin  800 units/h Daily heparin  level and CBC   Ozell Jamaica, PharmD, BCPS, [facility] Clinical Pharmacist [phone] Please check AMION for all [facility] Pharmacy numbers 10/12/2024

## 2024-10-12 NOTE — Plan of Care (Signed)
   Problem: Education: Goal: Knowledge of the procedure and recovery process will improve Outcome: Progressing   Problem: Bowel/Gastric: Goal: Gastrointestinal status for postoperative course will improve Outcome: Progressing   Problem: Pain Management: Goal: General experience of comfort will improve Outcome: Progressing   Problem: Skin Integrity: Goal: Demonstration of wound healing without infection will improve Outcome: Progressing   Problem: Urinary Elimination: Goal: Ability to avoid or minimize complications of infection will improve Outcome: Progressing Goal: Ability to achieve and maintain urine output will improve Outcome: Progressing Goal: Home care management will improve Outcome: Progressing   Problem: Education: Goal: Knowledge of General Education information will improve Description: Including pain rating scale, medication(s)/side effects and non-pharmacologic comfort measures Outcome: Progressing   Problem: Health Behavior/Discharge Planning: Goal: Ability to manage health-related needs will improve Outcome: Progressing   Problem: Clinical Measurements: Goal: Ability to maintain clinical measurements within normal limits will improve Outcome: Progressing Goal: Will remain free from infection Outcome: Progressing Goal: Diagnostic test results will improve Outcome: Progressing Goal: Respiratory complications will improve Outcome: Progressing Goal: Cardiovascular complication will be avoided Outcome: Progressing   Problem: Activity: Goal: Risk for activity intolerance will decrease Outcome: Progressing   Problem: Nutrition: Goal: Adequate nutrition will be maintained Outcome: Progressing   Problem: Coping: Goal: Level of anxiety will decrease Outcome: Progressing   Problem: Elimination: Goal: Will not experience complications related to bowel motility Outcome: Progressing Goal: Will not experience complications related to urinary  retention Outcome: Progressing   Problem: Pain Managment: Goal: General experience of comfort will improve and/or be controlled Outcome: Progressing   Problem: Safety: Goal: Ability to remain free from injury will improve Outcome: Progressing   Problem: Skin Integrity: Goal: Risk for impaired skin integrity will decrease Outcome: Progressing

## 2024-10-12 NOTE — Progress Notes (Signed)
 1 Day Post-Op   Subjective/Chief Complaint: Patient feels well post ERCP.  Notes reviewed.   Objective: Vital signs in last 24 hours: Temp:  [98.1 F (36.7 C)-99.1 F (37.3 C)] 98.1 F (36.7 C) (10/25 0934) Pulse Rate:  [65-77] 66 (10/25 0934) Resp:  [16-18] 18 (10/25 0934) BP: (116-132)/(70-78) 118/70 (10/25 0934) SpO2:  [96 %-99 %] 99 % (10/25 0934) Last BM Date : 10/10/24  Intake/Output from previous day: 10/24 0701 - 10/25 0700 In: 731.4 [I.V.:666; IV Piggyback:65.4] Out: 5 [Blood:5] Intake/Output this shift: Total I/O In: -  Out: 400 [Urine:400]  Abdomen: Soft nontender without rebound or guarding  Lab Results:  Recent Labs    10/11/24 0326 10/12/24 0249  WBC 7.2 5.3  HGB 11.8* 11.6*  HCT 37.4* 36.3*  PLT 237 200   BMET Recent Labs    10/11/24 0326 10/12/24 0249  NA 134* 136  K 3.5 4.1  CL 101 103  CO2 23 24  GLUCOSE 82 138*  BUN 9 13  CREATININE 0.98 0.99  CALCIUM  8.7* 8.8*   PT/INR No results for input(s): LABPROT, INR in the last 72 hours. ABG No results for input(s): PHART, HCO3 in the last 72 hours.  Invalid input(s): PCO2, PO2  Studies/Results: DG C-Arm 1-60 Min-No Report Result Date: 10/11/2024 Fluoroscopy was utilized by the requesting physician.  No radiographic interpretation.    Anti-infectives: Anti-infectives (From admission, onward)    Start     Dose/Rate Route Frequency Ordered Stop   10/11/24 0600  ceFAZolin  (ANCEF ) IVPB 2g/100 mL premix  Status:  Discontinued        2 g 200 mL/hr over 30 Minutes Intravenous On call to O.R. 10/10/24 1125 10/11/24 0946   10/08/24 2200  piperacillin -tazobactam (ZOSYN ) IVPB 3.375 g        3.375 g 12.5 mL/hr over 240 Minutes Intravenous Every 8 hours 10/08/24 2050         Assessment/Plan: Gallstone/choledocholithiasis with chronic cholecystitis  Reviewed the pros and cons of surgical intervention.  The gallbladder was quite contracted and was possibility following a  partial cholecystectomy can be done.  Due to his multiple complex medical issues, this does carry risk.  We discussed the pros and cons of watchful waiting but more than likely he would benefit from cholecystectomy depending on how he does.  No emergent need for surgery and his diet can be advanced for today.  I will discuss with the surgeons are coming on the service to decide on timing.  If it is felt that he can be discharged home and follow-up as an outpatient with Dr. Vernetta he saw initially in the office, that will be arranged.   LOS: 4 days    Kyle DELENA Shipper MD 10/12/2024 Moderate

## 2024-10-12 NOTE — Progress Notes (Signed)
 [facility] Progress Note  Chief Complaint: Choledocholithiasis  History:  No abdominal pain chest pain or dyspnea today Tolerating full liquid diet without difficulty.  Was seen by surgery (Cornett) earlier, and I subsequently talked with him about cholecystectomy. No black or bloody stool overnight, hemoglobin stable  Objective:   Current Facility-Administered Medications:    acetaminophen  (TYLENOL ) tablet 500 mg, 500 mg, Oral, Q6H PRN, Opyd, Timothy S, MD   fentaNYL  (SUBLIMAZE ) injection 12.5-50 mcg, 12.5-50 mcg, Intravenous, Q2H PRN, Opyd, Timothy S, MD   finasteride  (PROSCAR ) tablet 5 mg, 5 mg, Oral, QPM, Opyd, Timothy S, MD, 5 mg at 10/11/24 1718   heparin  ADULT infusion 100 units/mL (25000 units/250mL), 750 Units/hr, Intravenous, Continuous, Laurence Jinnie BIRCH, [facility], Last Rate: 7.5 mL/hr at 10/11/24 2338, 750 Units/hr at 10/11/24 2338   oxyCODONE  (Oxy IR/ROXICODONE ) immediate release tablet 5 mg, 5 mg, Oral, Q4H PRN, Opyd, Evalene RAMAN, MD, 5 mg at 10/11/24 1447   pantoprazole  (PROTONIX ) EC tablet 40 mg, 40 mg, Oral, Daily, Opyd, Timothy S, MD, 40 mg at 10/12/24 9085   piperacillin -tazobactam (ZOSYN ) IVPB 3.375 g, 3.375 g, Intravenous, Q8H, Opyd, Evalene RAMAN, MD, Last Rate: 12.5 mL/hr at 10/12/24 0541, 3.375 g at 10/12/24 0541   prochlorperazine  (COMPAZINE ) injection 5 mg, 5 mg, Intravenous, Q6H PRN, Opyd, Evalene RAMAN, MD, 5 mg at 10/11/24 1045   senna (SENOKOT) tablet 8.6 mg, 1 tablet, Oral, Daily PRN, Opyd, Evalene RAMAN, MD   sodium chloride  flush (NS) 0.9 % injection 3 mL, 3 mL, Intravenous, Q12H, Opyd, Evalene RAMAN, MD, 3 mL at 10/12/24 0914   heparin  750 Units/hr (10/11/24 2338)   piperacillin -tazobactam (ZOSYN )  IV 3.375 g (10/12/24 0541)     Vital signs in last 24 hrs: Vitals:   10/12/24 0041 10/12/24 0934  BP: 121/74 118/70  Pulse: 69 66  Resp: 18 18  Temp: 98.1 F (36.7 C) 98.1 F (36.7 C)  SpO2: 96% 99%    Intake/Output Summary (Last 24 hours) at 10/12/2024 1124 Last data filed  at 10/12/2024 0934 Gross per 24 hour  Intake 81.4 ml  Output 400 ml  Net -318.6 ml     Physical Exam Sitting up comfortably in bed, alert oriented and conversational No scleral icterus Cardiac: RRR without murmurs, S1S2 heard, no peripheral edema Pulm: clear to auscultation bilaterally, normal RR and effort noted Abdomen: soft, no tenderness, with active bowel sounds. No guarding or palpable hepatosplenomegaly Skin; warm and dry, no jaundice  Recent Labs:     Latest Ref Rng & Units 10/12/2024    2:49 AM 10/11/2024    3:26 AM 10/10/2024    5:22 AM  CBC  WBC 4.0 - 10.5 K/uL 5.3  7.2  6.7   Hemoglobin 13.0 - 17.0 g/dL 88.3  88.1  88.0   Hematocrit 39.0 - 52.0 % 36.3  37.4  37.4   Platelets 150 - 400 K/uL 200  237  204     No results for input(s): INR in the last 168 hours.    Latest Ref Rng & Units 10/12/2024    2:49 AM 10/11/2024    3:26 AM 10/10/2024    5:22 AM  CMP  Glucose 70 - 99 mg/dL 861  82  75   BUN 8 - 23 mg/dL 13  9  11    Creatinine 0.61 - 1.24 mg/dL 9.00  9.01  8.96   Sodium 135 - 145 mmol/L 136  134  134   Potassium 3.5 - 5.1 mmol/L 4.1  3.5  4.0  Chloride 98 - 111 mmol/L 103  101  102   CO2 22 - 32 mmol/L 24  23  21    Calcium  8.9 - 10.3 mg/dL 8.8  8.7  8.6   Total Protein 6.5 - 8.1 g/dL 6.6  6.7  6.7   Total Bilirubin 0.0 - 1.2 mg/dL 0.9  1.0  1.0   Alkaline Phos 38 - 126 U/L 515  394  454   AST 15 - 41 U/L 170  139  299   ALT 0 - 44 U/L 218  208  287      Radiologic studies:  ERCP report and images reviewed.  Findings also discussed with Dr. Wilhelmenia yesterday.  Assessment & Plan  Assessment: Choledocholithiasis causing elevated LFTs Symptomatic gallstones  Pain-free at present, no radiographic evidence of cholecystitis this admission. Rise in transaminases and alkaline phosphatase from yesterday, but occurs commonly after ERCP.  Sphincterotomy was performed, no bleeding has occurred afterwards with patient back on IV heparin  since  yesterday afternoon. Plan: Recommend cholecystectomy sooner rather than later, ideally this admission.  Surgical colleagues are conferring regarding timing, OR and provider availability  Trend LFTs daily, expect they will stabilize and steadily decrease  Discontinuing Zosyn   Dr. Vanderbilt has decided to change this patient to a regular diet, and I agree.  Inpatient GI service signing off-call as needed   Victory LITTIE Brand III Office: [phone]

## 2024-10-12 NOTE — Plan of Care (Signed)
  Problem: Education: Goal: Knowledge of the procedure and recovery process will improve Outcome: Progressing   Problem: Bowel/Gastric: Goal: Gastrointestinal status for postoperative course will improve Outcome: Progressing   Problem: Pain Management: Goal: General experience of comfort will improve Outcome: Progressing   Problem: Pain Managment: Goal: General experience of comfort will improve and/or be controlled Outcome: Progressing

## 2024-10-12 NOTE — Progress Notes (Addendum)
 PHARMACY - ANTICOAGULATION CONSULT NOTE  Pharmacy Consult for heparin  Indication: atrial fibrillation  Allergies  Allergen Reactions   Tetanus Toxoid-Containing Vaccines Other (See Comments)    Childhood Allergy    Zetia  [Ezetimibe ] Diarrhea    Patient Measurements: Height: 5' 3 (160 cm) Weight: 65.8 kg (145 lb) IBW/kg (Calculated) : 56.9 HEPARIN  DW (KG): 65.8  Vital Signs: Temp: 98.1 F (36.7 C) (10/25 0041) Temp Source: Oral (10/25 0041) BP: 121/74 (10/25 0041) Pulse Rate: 69 (10/25 0041)  Labs: Recent Labs    10/10/24 0522 10/10/24 1253 10/11/24 0326 10/12/24 0249  HGB 11.9*  --  11.8* 11.6*  HCT 37.4*  --  37.4* 36.3*  PLT 204  --  237 200  APTT 97* 93* 59* 72*  HEPARINUNFRC >1.10*  --  0.59 0.40  CREATININE 1.03  --  0.98 0.99    Estimated Creatinine Clearance: 51.1 mL/min (by C-G formula based on SCr of 0.99 mg/dL).   Medical History: Past Medical History:  Diagnosis Date   Anemia    Anxiety    Arthritis    BPH (benign prostatic hyperplasia)    Status post biopsy in 2009. - w/ LUTS   Coronary artery disease    a. 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 Patient also with history of atrial fibrillation taking apixaban  PTA. Patient reports last dose of apixaban  was 10/21 AM.  Surgery consulted; recommending holding Eliquis  and utilizing heparin  infusion for possibility of procedure.  Plan for MRCP 10/12/2024.    HL 0.4 is correlating with APTT 72, which is therapeutic on 750 units/hr.  Goal of Therapy:  Heparin  level 0.3-0.7 units/ml aPTT 66-102 seconds Monitor platelets by anticoagulation protocol: Yes   Plan:  Continue heparin  infusion 750 units/hr  D/C aPTT as now correlates with heparin  level Hl in 8 hours to confirm  Monitor daily heparin  level, CBC, signs/symptoms of bleeding   Kiaja Shorty A. Lyle, PharmD, BCPS, FNKF Clinical Pharmacist [place] Please utilize Amion for appropriate phone number to reach the unit pharmacist [facility])   Addendum: follow up Hl decreased to 0.33, will increase heparin  infusion to 800 units/hr to keep in the therapeutic range. Hl in 8 hours  Stella Bortle A. Lyle, PharmD, BCPS, FNKF Clinical Pharmacist [place] Please utilize Amion for appropriate phone number to reach the unit pharmacist [facility])

## 2024-10-13 DIAGNOSIS — K8020 Calculus of gallbladder without cholecystitis without obstruction: Secondary | ICD-10-CM | POA: Diagnosis not present

## 2024-10-13 LAB — COMPREHENSIVE METABOLIC PANEL WITH GFR
ALT: 148 U/L — ABNORMAL HIGH (ref 0–44)
AST: 71 U/L — ABNORMAL HIGH (ref 15–41)
Albumin: 2.6 g/dL — ABNORMAL LOW (ref 3.5–5.0)
Alkaline Phosphatase: 399 U/L — ABNORMAL HIGH (ref 38–126)
Anion gap: 8 (ref 5–15)
BUN: 21 mg/dL (ref 8–23)
CO2: 25 mmol/L (ref 22–32)
Calcium: 8.6 mg/dL — ABNORMAL LOW (ref 8.9–10.3)
Chloride: 103 mmol/L (ref 98–111)
Creatinine, Ser: 0.84 mg/dL (ref 0.61–1.24)
GFR, Estimated: >60 mL/min (ref >60)
Glucose, Bld: 104 mg/dL — ABNORMAL HIGH (ref 70–99)
Potassium: 3.9 mmol/L (ref 3.5–5.1)
Sodium: 136 mmol/L (ref 135–145)
Total Bilirubin: 0.6 mg/dL (ref 0.0–1.2)
Total Protein: 6 g/dL — ABNORMAL LOW (ref 6.5–8.1)

## 2024-10-13 LAB — CBC
HCT: 34.1 % — ABNORMAL LOW (ref 39.0–52.0)
Hemoglobin: 10.8 g/dL — ABNORMAL LOW (ref 13.0–17.0)
MCH: 25.7 pg — ABNORMAL LOW (ref 26.0–34.0)
MCHC: 31.7 g/dL (ref 30.0–36.0)
MCV: 81 fL (ref 80.0–100.0)
Platelets: 199 K/uL (ref 150–400)
RBC: 4.21 MIL/uL — ABNORMAL LOW (ref 4.22–5.81)
RDW: 16.3 % — ABNORMAL HIGH (ref 11.5–15.5)
WBC: 8.7 K/uL (ref 4.0–10.5)
nRBC: 0 % (ref 0.0–0.2)

## 2024-10-13 LAB — HEPARIN LEVEL (UNFRACTIONATED): Heparin Unfractionated: 0.44 [IU]/mL (ref 0.30–0.70)

## 2024-10-13 NOTE — Progress Notes (Signed)
 2 Days Post-Op   Subjective/Chief Complaint: Patient doing well today.  Tolerating diet.  Pain is minimal.  Discussed surgery with him.  Discussed the case with Dr. Legrand as well yesterday.   Objective: Vital signs in last 24 hours: Temp:  [97.9 F (36.6 C)-98.1 F (36.7 C)] 98 F (36.7 C) (10/26 0842) Pulse Rate:  [66-70] 68 (10/26 0842) Resp:  [15-18] 15 (10/26 0842) BP: (112-130)/(67-78) 130/73 (10/26 0842) SpO2:  [97 %-99 %] 97 % (10/26 0842) Last BM Date : 10/10/24  Intake/Output from previous day: 10/25 0701 - 10/26 0700 In: 223.7 [I.V.:173.7; IV Piggyback:50] Out: 700 [Urine:700] Intake/Output this shift: No intake/output data recorded.  Abdomen: Soft nontender without rebound or guarding.  Lab Results:  Recent Labs    10/12/24 0249 10/13/24 0451  WBC 5.3 8.7  HGB 11.6* 10.8*  HCT 36.3* 34.1*  PLT 200 199   BMET Recent Labs    10/12/24 0249 10/13/24 0451  NA 136 136  K 4.1 3.9  CL 103 103  CO2 24 25  GLUCOSE 138* 104*  BUN 13 21  CREATININE 0.99 0.84  CALCIUM  8.8* 8.6*   PT/INR No results for input(s): LABPROT, INR in the last 72 hours. ABG No results for input(s): PHART, HCO3 in the last 72 hours.  Invalid input(s): PCO2, PO2  Studies/Results: DG C-Arm 1-60 Min-No Report Result Date: 10/11/2024 Fluoroscopy was utilized by the requesting physician.  No radiographic interpretation.    Anti-infectives: Anti-infectives (From admission, onward)    Start     Dose/Rate Route Frequency Ordered Stop   10/11/24 0600  ceFAZolin  (ANCEF ) IVPB 2g/100 mL premix  Status:  Discontinued        2 g 200 mL/hr over 30 Minutes Intravenous On call to O.R. 10/10/24 1125 10/11/24 0946   10/08/24 2200  piperacillin -tazobactam (ZOSYN ) IVPB 3.375 g  Status:  Discontinued        3.375 g 12.5 mL/hr over 240 Minutes Intravenous Every 8 hours 10/08/24 2050 10/12/24 1128       Assessment/Plan: Gallstone/choledocholithiasis with chronic cholecystitis    Reviewed the pros and cons of surgical intervention.  The gallbladder was quite contracted and was possibility following a partial cholecystectomy can be done.  Due to his multiple complex medical issues, this does carry risk.  We discussed the pros and cons of watchful waiting but more than likely he would benefit from cholecystectomy depending on how he does.  Reviewed the case with Dr. Legrand.  We both agree cholecystectomy for him would be his best option to prevent recurrences.  I discussed this with the patient this morning again.  He is in agreement to proceed.  Plan to hold heparin  tomorrow morning at 1234 OR in a.m. for laparoscopic cholecystectomy with Dr. Ann.  I have discussed this at great length with him and he is eager to proceed which I feel given his multiple recurrences will be necessary.The procedure has been discussed with the patient. Operative and non operative treatments have been discussed. Risks of surgery include bleeding, infection,  Common bile duct injury,  Injury to the stomach,liver, colon,small intestine, abdominal wall,  Diaphragm,  Major blood vessels,  And the need for an open procedure.  Other risks include worsening of medical problems, death,  DVT and pulmonary embolism, and cardiovascular events.   Medical options have also been discussed. The patient has been informed of long term expectations of surgery and non surgical options,  The patient agrees to proceed.      LOS:  5 days    Debby DELENA Shipper  MD  10/13/2024 High-level complexity

## 2024-10-13 NOTE — Progress Notes (Signed)
 PHARMACY - ANTICOAGULATION CONSULT NOTE  Pharmacy Consult for heparin  Indication: atrial fibrillation  Allergies  Allergen Reactions   Tetanus Toxoid-Containing Vaccines Other (See Comments)    Childhood Allergy    Zetia  [Ezetimibe ] Diarrhea    Patient Measurements: Height: 5' 3 (160 cm) Weight: 65.8 kg (145 lb) IBW/kg (Calculated) : 56.9 HEPARIN  DW (KG): 65.8  Vital Signs: Temp: 98 F (36.7 C) (10/26 0842) Temp Source: Oral (10/26 0842) BP: 130/73 (10/26 0842) Pulse Rate: 68 (10/26 0842)  Labs: Recent Labs    10/10/24 1253 10/11/24 0326 10/11/24 0326 10/12/24 0249 10/12/24 1051 10/12/24 2015 10/13/24 0451  HGB  --  11.8*   < > 11.6*  --   --  10.8*  HCT  --  37.4*  --  36.3*  --   --  34.1*  PLT  --  237  --  200  --   --  199  APTT 93* 59*  --  72*  --   --   --   HEPARINUNFRC  --  0.59   < > 0.40 0.33 0.36 0.44  CREATININE  --  0.98  --  0.99  --   --  0.84   < > = values in this interval not displayed.    Estimated Creatinine Clearance: 60.2 mL/min (by C-G formula based on SCr of 0.84 mg/dL).   Medical History: Past Medical History:  Diagnosis Date   Anemia    Anxiety    Arthritis    BPH (benign prostatic hyperplasia)    Status post biopsy in 2009. - w/ LUTS   Coronary artery disease    a. 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 Patient also with history of atrial fibrillation taking apixaban  PTA. Patient reports last dose of apixaban  was 10/21 AM. Surgery consulted; recommending holding Eliquis  and utilizing heparin  infusion for possibility of procedure.  Plan for MRCP 10/13/2024.    Heparin  level remains therapeutic this morning at 0.44. Per surgery's progress note on 10/26 - hold heparin  tomorrow morning at 1234 OR in a.m. for laparoscopic cholecystectomy, therefore, adjusting heparin  order to stop at 0030 on 10/27.   Goal of Therapy:  Heparin  level 0.3-0.7 units/ml aPTT 66-102 seconds Monitor platelets by anticoagulation protocol: Yes   Plan:  Heparin  800 units/h >> stopping tonight at 0030 (10/27) for anticipated surgery Daily heparin  level and CBC  Feliciano Close, PharmD PGY2 Infectious Diseases Pharmacy Resident  10/13/2024 9:17 AM

## 2024-10-13 NOTE — Plan of Care (Signed)
  Problem: Education: Goal: Knowledge of the procedure and recovery process will improve Outcome: Progressing   

## 2024-10-13 NOTE — Progress Notes (Signed)
 PROGRESS NOTE Kyle Matthews    DOB: [birth year], 78 y.o.  FMW:[id number]    Code Status: Full Code   DOA: 10/08/2024   LOS: 5  Brief hospital course  Patient is a 78 year old male with past medical history significant for hypertension, hyperlipidemia, CAD status post CABG, bioprosthetic AVR, native tricuspid valve endocarditis s/p TVR, atrial fibrillation and flutter on Eliquis , PPM, history of CVA, COPD, and symptomatic cholelithiasis.  Patient was admitted with recurrent upper abdominal pain, nausea, and vomiting.  Input from the GI and surgery team is highly appreciated.  Surgery team is deciding timing of possible cholecystectomy.   MRCP revealed partial obstruction with cholecystitis. ERCP today. Clinically improved. Will need to discuss if surgical candidate given anticoagulation needs. May be candidate for bili drain.  ERCP revealed: Impression:       - Z-line irregular, 40 cm from the incisors.                           - Gastritis - biopsied.                           - No gross lesions in the duodenal bulb, in the                            first portion of the duodenum and in the second                            portion of the duodenum.                           - The major papilla was adjacent to a diverticulum.                            It was hidden under a hood. Otherwise the major                            papilla appeared normal.                           - A filling defect consistent with a stone and                            sludge was seen on the cholangiogram.                           - The entire main bile duct was mildly dilated.                           - Choledocholithiasis was found. Complete removal                            was accomplished by biliary sphincterotomy and                            balloon sweeping.  10/12/2024: Patient seen.  No new complaints.  No abdominal pain.  No nausea or vomiting.  Awaiting  surgery input.  10/13/2024: Patient seen  alongside patient's wife.  For possible cholecystectomy tomorrow.  Assessment & Plan  Principal Problem:   Symptomatic cholelithiasis Active Problems:   Essential hypertension   Coronary artery disease involving native coronary artery of native heart with unstable angina pectoris (HCC)   Chronic heart failure with preserved ejection fraction (HFpEF) (HCC)   History of stroke   Atrial fibrillation and flutter (HCC)   Choledocholithiasis   Elevated LFTs   AKI (acute kidney injury)   Prolonged QT interval   Erosive gastritis   Dilated bile duct   Symptomatic cholelithiasis; concern for choledocholithiasis- MRCP results: Cholelithiasis and partially obstructive choledocholithiasis and sludge, similar to prior. - GI following. ERCP today - continue Zosyn , repeat labs in am, hold Eliquis  and use IV heparin  for now  - gen surg, likely will need cholecystectomy, timing TBD given anticoagulation needs. May be candidate for bili drain. Symptomatically improved.  - advance diet as tolerated post-procedure.  10/12/2024: Awaiting input from the surgical team. 10/13/2024: Likely cholecystectomy tomorrow.   AKI- resolved - Likely prerenal in setting of N/V  - Hold Lasix , start gentle IVF hydration, renally-dose medications, repeat chem panel in am     Atrial fibrillation, flutter  - S/p DCCV 09/13/24  - Hold Eliquis , continue IV heparin  for now - continue to hold amio. 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 Does not require f/u   CAD  - No anginal complaints     Chronic HFpEF  - EF was normal on echo in August 2025    - Appears compensated    Hypertension- BP WNL   Prolonged QT interval  - Last Magnesium  was 2.2    Body mass index is 25.69 kg/m.  VTE ppx: heparin    Diet:     Diet   Diet NPO time specified Except for: Ice Chips, Sips with Meds   DIET SOFT Room service appropriate? Yes; Fluid consistency: Thin    Consultants: GI General surgery   Subjective 10/13/24    No complaints   Objective    General: awake, alert, not in any distress. HEENT: Mild pallor.   Respiratory: Clear to auscultation. Cardiovascular: S1-S2.   Gastrointestinal: Soft and nontender.   Nervous: Awake and alert.  Labs   I have personally reviewed the following labs and imaging studies CBC    Component Value Date/Time   WBC 8.7 10/13/2024 0451   RBC 4.21 (L) 10/13/2024 0451   HGB 10.8 (L) 10/13/2024 0451   HCT 34.1 (L) 10/13/2024 0451   PLT 199 10/13/2024 0451   MCV 81.0 10/13/2024 0451   MCH 25.7 (L) 10/13/2024 0451   MCHC 31.7 10/13/2024 0451   RDW 16.3 (H) 10/13/2024 0451   LYMPHSABS 0.7 08/20/2024 1201   MONOABS 0.5 08/20/2024 1201   EOSABS 139 09/11/2024 1353   BASOSABS 73 09/11/2024 1353      Latest Ref Rng & Units 10/13/2024    4:51 AM 10/12/2024    2:49 AM 10/11/2024    3:26 AM  BMP  Glucose 70 - 99 mg/dL 895  861  82   BUN 8 - 23 mg/dL 21  13  9    Creatinine 0.61 - 1.24 mg/dL 9.15  9.00  9.01   Sodium 135 - 145 mmol/L 136  136  134   Potassium 3.5 - 5.1 mmol/L 3.9  4.1  3.5   Chloride 98 - 111 mmol/L 103  103  101  CO2 22 - 32 mmol/L 25  24  23    Calcium  8.9 - 10.3 mg/dL 8.6  8.8  8.7     No results found.   Disposition Plan & Communication  Patient status: Inpatient  Admitted From: Home Planned disposition location: Home Anticipated discharge date: 10/24 pending possible surgical course  Family Communication: none at bedside    Author: Leatrice LILLETTE Chapel, MD [facility] 10/13/2024, 5:20 PM   Available by Epic secure chat 7AM-7PM. If 7PM-7AM, please contact night-coverage.  [facility] contact information found on [email or web address].

## 2024-10-14 ENCOUNTER — Inpatient Hospital Stay (HOSPITAL_COMMUNITY)

## 2024-10-14 ENCOUNTER — Encounter (HOSPITAL_COMMUNITY)
Admission: EM | Disposition: A | Discharge status: Home / Self Care | Payer: Self-pay | Source: Home / Self Care | Attending: Internal Medicine

## 2024-10-14 ENCOUNTER — Encounter (HOSPITAL_COMMUNITY): Payer: Self-pay | Admitting: Gastroenterology

## 2024-10-14 DIAGNOSIS — K811 Chronic cholecystitis: Secondary | ICD-10-CM

## 2024-10-14 DIAGNOSIS — I2510 Atherosclerotic heart disease of native coronary artery without angina pectoris: Secondary | ICD-10-CM

## 2024-10-14 DIAGNOSIS — I10 Essential (primary) hypertension: Secondary | ICD-10-CM | POA: Diagnosis not present

## 2024-10-14 DIAGNOSIS — Z87891 Personal history of nicotine dependence: Secondary | ICD-10-CM | POA: Diagnosis not present

## 2024-10-14 DIAGNOSIS — K8020 Calculus of gallbladder without cholecystitis without obstruction: Secondary | ICD-10-CM | POA: Diagnosis not present

## 2024-10-14 HISTORY — PX: INDOCYANINE GREEN FLUORESCENCE IMAGING (ICG): SHX7595

## 2024-10-14 HISTORY — PX: CHOLECYSTECTOMY: SHX55

## 2024-10-14 LAB — SURGICAL PATHOLOGY

## 2024-10-14 LAB — HEPARIN LEVEL (UNFRACTIONATED): Heparin Unfractionated: <0.1 [IU]/mL — ABNORMAL LOW (ref 0.30–0.70)

## 2024-10-14 SURGERY — LAPAROSCOPIC CHOLECYSTECTOMY
Anesthesia: General

## 2024-10-14 MED ORDER — PROPOFOL 10 MG/ML IV BOLUS
INTRAVENOUS | Status: DC | PRN
  Administered 2024-10-14: 100 mg via INTRAVENOUS

## 2024-10-14 MED ORDER — FENTANYL CITRATE (PF) 100 MCG/2ML IJ SOLN
INTRAMUSCULAR | Status: AC
  Filled 2024-10-14: qty 2

## 2024-10-14 MED ORDER — CEFAZOLIN SODIUM-DEXTROSE 2-4 GM/100ML-% IV SOLN
2.0000 g | INTRAVENOUS | Status: AC
  Administered 2024-10-14: 2 g via INTRAVENOUS
  Filled 2024-10-14: qty 100

## 2024-10-14 MED ORDER — FENTANYL CITRATE (PF) 100 MCG/2ML IJ SOLN: 25.0000 ug | INTRAMUSCULAR | Status: DC | PRN

## 2024-10-14 MED ORDER — INDOCYANINE GREEN 25 MG IV SOLR
1.2500 mg | Freq: Once | INTRAVENOUS | Status: AC
  Administered 2024-10-14: 1.25 mg via INTRAVENOUS
  Filled 2024-10-14: qty 10

## 2024-10-14 MED ORDER — OXYCODONE HCL 5 MG PO TABS: 5.0000 mg | ORAL_TABLET | Freq: Once | ORAL | Status: AC | PRN

## 2024-10-14 MED ORDER — BUPIVACAINE-EPINEPHRINE (PF) 0.25% -1:200000 IJ SOLN
INTRAMUSCULAR | Status: AC
  Filled 2024-10-14: qty 30

## 2024-10-14 MED ORDER — AMOXICILLIN-POT CLAVULANATE 875-125 MG PO TABS
1.0000 | ORAL_TABLET | Freq: Two times a day (BID) | ORAL | Status: AC
  Administered 2024-10-14 – 2024-10-18 (×10): 1 via ORAL
  Filled 2024-10-14 (×10): qty 1

## 2024-10-14 MED ORDER — HYDROMORPHONE HCL 1 MG/ML IJ SOLN: 0.5000 mg | INTRAMUSCULAR | Status: DC | PRN

## 2024-10-14 MED ORDER — ORAL CARE MOUTH RINSE: 15.0000 mL | Freq: Once | OROMUCOSAL | Status: AC

## 2024-10-14 MED ORDER — CHLORHEXIDINE GLUCONATE 0.12 % MT SOLN: 15.0000 mL | Freq: Once | OROMUCOSAL | Status: AC

## 2024-10-14 MED ORDER — ROCURONIUM BROMIDE 10 MG/ML (PF) SYRINGE
PREFILLED_SYRINGE | INTRAVENOUS | Status: DC | PRN
  Administered 2024-10-14: 50 mg via INTRAVENOUS

## 2024-10-14 MED ORDER — SUGAMMADEX SODIUM 200 MG/2ML IV SOLN
INTRAVENOUS | Status: DC | PRN
Start: 2024-10-14 — End: 2024-10-14
  Administered 2024-10-14: 200 mg via INTRAVENOUS

## 2024-10-14 MED ORDER — OXYCODONE HCL 5 MG/5ML PO SOLN
ORAL | Status: AC
  Filled 2024-10-14: qty 5

## 2024-10-14 MED ORDER — FENTANYL CITRATE (PF) 250 MCG/5ML IJ SOLN
INTRAMUSCULAR | Status: DC | PRN
  Administered 2024-10-14 (×2): 100 ug via INTRAVENOUS

## 2024-10-14 MED ORDER — CHLORHEXIDINE GLUCONATE CLOTH 2 % EX PADS: 6.0000 | MEDICATED_PAD | Freq: Once | CUTANEOUS | Status: DC

## 2024-10-14 MED ORDER — ACETAMINOPHEN 325 MG PO TABS
650.0000 mg | ORAL_TABLET | Freq: Four times a day (QID) | ORAL | Status: DC
  Administered 2024-10-14 – 2024-10-15 (×4): 650 mg via ORAL
  Filled 2024-10-14 (×4): qty 2

## 2024-10-14 MED ORDER — LACTATED RINGERS IV SOLN: INTRAVENOUS | Status: DC | PRN

## 2024-10-14 MED ORDER — OXYCODONE HCL 5 MG/5ML PO SOLN
5.0000 mg | Freq: Once | ORAL | Status: AC | PRN
  Administered 2024-10-14: 5 mg via ORAL

## 2024-10-14 MED ORDER — PHENYLEPHRINE HCL-NACL 20-0.9 MG/250ML-% IV SOLN
INTRAVENOUS | Status: DC | PRN
  Administered 2024-10-14: 30 ug/min via INTRAVENOUS

## 2024-10-14 MED ORDER — CHLORHEXIDINE GLUCONATE 0.12 % MT SOLN
OROMUCOSAL | Status: AC
  Administered 2024-10-14: 15 mL via OROMUCOSAL
  Filled 2024-10-14: qty 15

## 2024-10-14 MED ORDER — DROPERIDOL 2.5 MG/ML IJ SOLN: 0.6250 mg | Freq: Once | INTRAMUSCULAR | Status: DC | PRN

## 2024-10-14 MED ORDER — LIDOCAINE 2% (20 MG/ML) 5 ML SYRINGE
INTRAMUSCULAR | Status: DC | PRN
  Administered 2024-10-14: 100 mg via INTRAVENOUS

## 2024-10-14 MED ORDER — PROPOFOL 10 MG/ML IV BOLUS
INTRAVENOUS | Status: AC
  Filled 2024-10-14: qty 20

## 2024-10-14 MED ORDER — ONDANSETRON HCL 4 MG/2ML IJ SOLN
INTRAMUSCULAR | Status: DC | PRN
  Administered 2024-10-14: 4 mg via INTRAVENOUS

## 2024-10-14 MED ORDER — DEXAMETHASONE SOD PHOSPHATE PF 10 MG/ML IJ SOLN
INTRAMUSCULAR | Status: DC | PRN
  Administered 2024-10-14: 5 mg via INTRAVENOUS

## 2024-10-14 MED ORDER — PHENYLEPHRINE 80 MCG/ML (10ML) SYRINGE FOR IV PUSH (FOR BLOOD PRESSURE SUPPORT)
PREFILLED_SYRINGE | INTRAVENOUS | Status: DC | PRN
  Administered 2024-10-14 (×2): 80 ug via INTRAVENOUS

## 2024-10-14 MED ORDER — LACTATED RINGERS IV SOLN: INTRAVENOUS | Status: AC

## 2024-10-14 MED ORDER — ACETAMINOPHEN 10 MG/ML IV SOLN
INTRAVENOUS | Status: AC
  Filled 2024-10-14: qty 100

## 2024-10-14 MED ORDER — ACETAMINOPHEN 10 MG/ML IV SOLN
1000.0000 mg | Freq: Once | INTRAVENOUS | Status: DC | PRN
  Administered 2024-10-14: 1000 mg via INTRAVENOUS

## 2024-10-14 MED ORDER — HYDROMORPHONE HCL 1 MG/ML IJ SOLN
INTRAMUSCULAR | Status: DC | PRN
  Administered 2024-10-14: .5 mg via INTRAVENOUS

## 2024-10-14 MED ORDER — HEPARIN (PORCINE) 25000 UT/250ML-% IV SOLN
1050.0000 [IU]/h | INTRAVENOUS | Status: DC
  Administered 2024-10-14 – 2024-10-15 (×2): 800 [IU]/h via INTRAVENOUS
  Administered 2024-10-16: 900 [IU]/h via INTRAVENOUS
  Filled 2024-10-14 (×2): qty 250

## 2024-10-14 MED ORDER — BUPIVACAINE-EPINEPHRINE 0.25% -1:200000 IJ SOLN
INTRAMUSCULAR | Status: DC | PRN
  Administered 2024-10-14: 20 mL

## 2024-10-14 MED ORDER — LABETALOL HCL 5 MG/ML IV SOLN
INTRAVENOUS | Status: DC | PRN
  Administered 2024-10-14: 2.5 mg via INTRAVENOUS

## 2024-10-14 MED ORDER — HYDROMORPHONE HCL 1 MG/ML IJ SOLN
INTRAMUSCULAR | Status: AC
  Filled 2024-10-14: qty 0.5

## 2024-10-14 SURGICAL SUPPLY — 42 items
BAG COUNTER SPONGE SURGICOUNT (BAG) ×2 IMPLANT
BLADE CLIPPER SURG (BLADE) IMPLANT
CANISTER SUCTION 3000ML PPV (SUCTIONS) ×2 IMPLANT
CHLORAPREP W/TINT 26 (MISCELLANEOUS) ×2 IMPLANT
CLIP APPLIE ROT 10 11.4 M/L (STAPLE) IMPLANT
CLIP LIGATING HEMO LOK XL GOLD (MISCELLANEOUS) IMPLANT
CLIP LIGATING HEMO O LOK GREEN (MISCELLANEOUS) ×2 IMPLANT
CNTNR URN SCR LID CUP LEK RST (MISCELLANEOUS) ×2 IMPLANT
COVER SURGICAL LIGHT HANDLE (MISCELLANEOUS) ×2 IMPLANT
DERMABOND ADVANCED .7 DNX12 (GAUZE/BANDAGES/DRESSINGS) ×2 IMPLANT
DRAIN CHANNEL 19F RND (DRAIN) IMPLANT
ELECTRODE REM PT RTRN 9FT ADLT (ELECTROSURGICAL) ×2 IMPLANT
GLOVE BIOGEL PI MICRO STRL 6 (GLOVE) ×2 IMPLANT
GLOVE INDICATOR 6.5 STRL GRN (GLOVE) ×2 IMPLANT
GOWN STRL REUS W/ TWL LRG LVL3 (GOWN DISPOSABLE) ×6 IMPLANT
GRASPER SUT TROCAR 14GX15 (MISCELLANEOUS) ×2 IMPLANT
IRRIGATION SUCT STRKRFLW 2 WTP (MISCELLANEOUS) ×2 IMPLANT
KIT BASIN OR (CUSTOM PROCEDURE TRAY) ×2 IMPLANT
KIT IMAGING PINPOINTPAQ (MISCELLANEOUS) IMPLANT
KIT TURNOVER KIT B (KITS) ×2 IMPLANT
LHOOK LAP DISP 36CM (ELECTROSURGICAL) ×2 IMPLANT
NDL 22X1.5 STRL (OR ONLY) (MISCELLANEOUS) ×2 IMPLANT
NDL INSUFFLATION 14GA 120MM (NEEDLE) ×2 IMPLANT
NEEDLE 22X1.5 STRL (OR ONLY) (MISCELLANEOUS) ×1 IMPLANT
NEEDLE INSUFFLATION 14GA 120MM (NEEDLE) ×1 IMPLANT
PAD ARMBOARD POSITIONER FOAM (MISCELLANEOUS) ×2 IMPLANT
PENCIL BUTTON HOLSTER BLD 10FT (ELECTRODE) ×2 IMPLANT
POUCH LAPAROSCOPIC INSTRUMENT (MISCELLANEOUS) ×2 IMPLANT
SCISSORS LAP 5X35 DISP (ENDOMECHANICALS) ×2 IMPLANT
SET TUBE SMOKE EVAC HIGH FLOW (TUBING) ×2 IMPLANT
SLEEVE Z-THREAD 5X100MM (TROCAR) ×4 IMPLANT
SOLN 0.9% NACL POUR BTL 1000ML (IV SOLUTION) ×2 IMPLANT
SOLN STERILE WATER BTL 1000 ML (IV SOLUTION) ×2 IMPLANT
SUT MNCRL AB 4-0 PS2 18 (SUTURE) ×2 IMPLANT
SUT VICRYL 0 UR6 27IN ABS (SUTURE) IMPLANT
SYSTEM BAG RETRIEVAL 10MM (BASKET) ×2 IMPLANT
TOWEL GREEN STERILE (TOWEL DISPOSABLE) IMPLANT
TOWEL GREEN STERILE FF (TOWEL DISPOSABLE) ×2 IMPLANT
TRAY LAPAROSCOPIC MC (CUSTOM PROCEDURE TRAY) ×2 IMPLANT
TROCAR Z THREAD OPTICAL 12X100 (TROCAR) ×2 IMPLANT
TROCAR Z-THREAD OPTICAL 5X100M (TROCAR) ×2 IMPLANT
WARMER LAPAROSCOPE (MISCELLANEOUS) ×2 IMPLANT

## 2024-10-14 NOTE — Transfer of Care (Signed)
 Immediate Anesthesia Transfer of Care Note  Patient: Kyle Matthews  Procedure(s) Performed: LAPAROSCOPIC CHOLECYSTECTOMY, SUBTOTAL INDOCYANINE GREEN FLUORESCENCE IMAGING (ICG)  Patient Location: PACU  Anesthesia Type:General  Level of Consciousness: awake, alert , and oriented  Airway & Oxygen Therapy: Patient Spontanous Breathing and Patient connected to nasal cannula oxygen  Post-op Assessment: Report given to RN and Post -op Vital signs reviewed and stable  Post vital signs: Reviewed and stable  Last Vitals:  Vitals Value Taken Time  BP 140/72 10/14/24 09:27  Temp 97.8   Pulse 67 10/14/24 09:30  Resp 13 10/14/24 09:30  SpO2 95 % 10/14/24 09:30  Vitals shown include unfiled device data.  Last Pain:  Vitals:   10/14/24 0704  TempSrc: Oral  PainSc:       Patients Stated Pain Goal: 0 (10/12/24 0704)  Complications: No notable events documented.

## 2024-10-14 NOTE — Op Note (Signed)
 10/08/2024 - 10/14/2024 9:21 AM  PATIENT: Kyle Matthews  77 y.o. male  Patient Care Team: [name], DO as PCP - General (Family Medicine) Thukkani, Arun K, MD as PCP - Cardiology (Cardiology) Inocencio Soyla Lunger, MD as PCP - Electrophysiology (Cardiology)  PRE-OPERATIVE DIAGNOSIS: chronic cholecystitis, cholelithiasis  POST-OPERATIVE DIAGNOSIS: same  PROCEDURE: laparoscopic fenestrating subtotal cholecystectomy  SURGEON: Orie Silversmith, MD  ASSISTANT:  None  ANESTHESIA: General endotracheal  EBL: Minimal  DRAINS: 19Fr Blake Drain  SPECIMEN: Gallbladder  COUNTS: Sponge, needle and instrument counts were reported correct x2 at the conclusion of the operation  DISPOSITION: PACU in satisfactory condition  COMPLICATIONS: Inadvertent removal of left upper quadrant nevus.  FINDINGS: Unable to see critical view of safety. ICG did no fluoresce at all. Duodenum adherent to gallbladder wall with inability to safely dissect free from gallbladder. Subtotal fenestrating cholecystectomy performed with only small amount of anterior superior wall able to be removed. Fulguration of gallbladder mucosa. 19 Fr drain left in RUQ coursing along inferior liver and gallbladder. Significant stone burden with removal of all stones. No bile leak evident after removal of stones.  DESCRIPTION:  Preoperative indocyanine green was administered in preoperative holding. The patient was identified & brought into the operating room. He was then positioned supine on the OR table. SCDs were in place and active during the entire case. He then underwent general endotracheal anesthesia. Pressure points were padded. Hair on the abdomen was clipped by the OR team. The abdomen was prepped and draped in the standard sterile fashion. Antibiotics were administered. A surgical timeout was performed and confirmed our plan.  A small incision was made in the LUQ at Palmer's point and a veress needle was inserted. Air was  aspirated and subsequent positive drop test. Abdomen then insufflated to . A periumbilical incision was then made and a 5mm trocar optiview using a 30 degree scope was inserted and the abdomen was entered under direct visualization. Inspection confirmed no evidence of trocar or veress site complications. The veress was then removed.  There was a skin tag/nevus of the LUQ that was inadvertently removed when lightly rubbed while removing veress.  The patient was then positioned in reverse Trendelenburg with slight left side down. A 12 mm supxiphoid trocar was placed under direct visualization and  two additional 5mm trocars were placed along the right subcostal line - one 5mm port in mid subcostal region, another 5mm port in the right flank near the anterior axillary line.  The liver and gallbladder were inspected. The gall bladder was contracted and inflamed. The gallbladder fundus was grasped and elevated cephalad. Attention turned to infrared fluorescent cholangiography with indocyanine green which did not show any fluorescence of biliary structures. Omental adhesions were taken down with electrocautery. It was then noted that the duodenum was adherent to anterior gallbladder wall, essentially from mid point of body of the gallbladder down. I did not feel like there was a safe plane to free the duodenum from the gallbladder without injury so decision made to perform a subtotal cholecystectomy. Staying high on the gallbladder, the gallbladder was transected with electrocautery at a point safely above duodenum adhesions. The anterior wall was then removed. There was a large stone burden within the gallbladder. These stones were carefully removed with combination of suction irrigator, and stone grasper. The gallbladder was irrigated internally and care taken to irrigated entire RUQ to ensure all stones were suctioned/removed. No obvious bile leak from gallbladder after removal of stones. The mucosa of the  gallbladder was fulgurated with electrocautery.   A 19 Fr blake drain was then placed into the abdomen with externalized portion coming from right most lateral port site. This was adjusted to run along inferior portion of liver and along remaining portion of gallbladder. It was secured to skin with 2-0 nylon. The endocatch bag containing the gallbladder was then removed from the subxiphoid port site and passed off as specimen. The subxiphoid port fascia was then closed in a figured of eight fashion with 0 vicryl using a suture passer. The remaining RUQ port was removed under direct visualization and noted to be hemostatic.SABRA The fascia was palpated and noted to be completely closed. The abdomen was then desufflated and the periumbilical trocar removed. The skin of all incision sites was approximated with 4-0 monocryl subcuticular suture and dermabond applied. The was then awakened from anesthesia, extubated, and transferred to a stretcher for transport to PACU in satisfactory condition.  Instrument, sponge, and needle counts were correct at closure and at the conclusion of the case.   Orie Silversmith, MD [facility]

## 2024-10-14 NOTE — Plan of Care (Signed)
 Patient denied pain during shift. CHG wipes were used shortly before 2200, and a new gown was given. Patient was made NPO at midnight. Heparin  drip was stopped at 0040. No signed consent was present in chart last night. Tele order was renewed early this morning. CHG wipes were used at 0547, new gown, bedpad, op sheet and socks were put on.  Problem: Pain Management: Goal: General experience of comfort will improve Outcome: Progressing   Problem: Skin Integrity: Goal: Demonstration of wound healing without infection will improve Outcome: Progressing   Problem: Urinary Elimination: Goal: Ability to avoid or minimize complications of infection will improve Outcome: Progressing   Problem: Activity: Goal: Risk for activity intolerance will decrease Outcome: Progressing

## 2024-10-14 NOTE — Anesthesia Preprocedure Evaluation (Signed)
 Anesthesia Evaluation  Patient identified by MRN, date of birth, ID band Patient awake    Reviewed: Allergy & Precautions, H&P , NPO status , Patient's Chart, lab work & pertinent test results  History of Anesthesia Complications (+) history of anesthetic complications  Airway Mallampati: II   Neck ROM: full    Dental  (+) Teeth Intact   Pulmonary Current Smoker and Patient abstained from smoking., former smoker   breath sounds clear to auscultation       Cardiovascular Exercise Tolerance: Good hypertension, (-) angina + CAD, + Past MI, + CABG and +CHF  + dysrhythmias Atrial Fibrillation + pacemaker  Rhythm:regular Rate:Normal  IMPRESSIONS     1. 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 Mean  TV gradient 4 mmHg. Trivial tricuspid regurgitation with grossly normal  prosthetic function. The tricuspid valve is has been repaired/replaced.   2. The aortic valve has been repaired/replaced. Aortic valve  regurgitation is not visualized. There is a 21 mm KONECT RESILIA  pericardial valve graft present in the aortic position. Procedure Date:  06/26/2024. Mean AV gradient 7.7 mmHg suggesting normal  function. Dimentionless index 0.68.   3. Left pleural effusion present, likely at least moderate based on  available images.   4. Right ventricular systolic function is low normal. The right  ventricular size is normal. Tricuspid regurgitation signal is inadequate  for assessing PA pressure. Device wire present.   5. The mitral valve is degenerative. Moderate mitral valve regurgitation.  The mean mitral valve gradient is 4.0 mmHg. Moderate mitral annular  calcification.   6. Aortic dilatation noted. There is borderline dilatation of the aortic  root, measuring 40 mm.   7. Left ventricular ejection fraction, by estimation, is 60 to 65%. Left  ventricular ejection fraction by 3D volume is 56 %. The left ventricle has   normal function. The left ventricle demonstrates regional wall motion  abnormalities (see scoring  diagram/findings for description). Left ventricular diastolic parameters  are indeterminate.   8. The inferior vena cava is dilated in size with <50% respiratory  variability, suggesting right atrial pressure of 15 mmHg.     Neuro/Psych  PSYCHIATRIC DISORDERS Anxiety Depression    CVA, No Residual Symptoms    GI/Hepatic ,GERD  ,,Cholecystitis   Endo/Other    Renal/GU      Musculoskeletal  (+) Arthritis ,    Abdominal   Peds  Hematology   Anesthesia Other Findings Past Medical History: No date: Anemia No date: Anxiety No date: Arthritis No date: BPH (benign prostatic hyperplasia)     Comment:  Status post biopsy in 2009. - w/ LUTS No date: Coronary artery disease     Comment:  a. 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 04/13/2017: Severe aortic stenosis 2007: Stroke [facility])     Comment:  No true etiology noted No date: UTI (urinary tract  infection)   Reproductive/Obstetrics                              Anesthesia Physical Anesthesia Plan  ASA: 3  Anesthesia Plan: General   Post-op Pain Management: Ofirmev  IV (intra-op)*   Induction: Intravenous  PONV Risk Score and Plan: 2 and Treatment may vary due to age or medical condition, Ondansetron  and Dexamethasone   Airway Management  Planned: Oral ETT  Additional Equipment: None  Intra-op Plan:   Post-operative Plan: Extubation in OR  Informed Consent: I have reviewed the patients History and Physical, chart, labs and discussed the procedure including the risks, benefits and alternatives for the proposed anesthesia with the patient or authorized representative who has indicated his/her understanding and acceptance.     Dental advisory given  Plan Discussed with: CRNA  Anesthesia Plan Comments: (Discussed risks of anesthesia with patient, including PONV, sore throat, lip/dental/eye damage. Rare risks discussed as well, such as cardiorespiratory and neurological sequelae, and allergic reactions. Discussed the role of CRNA in patient's perioperative care. Patient understands.)         Anesthesia Quick Evaluation

## 2024-10-14 NOTE — Progress Notes (Signed)
*   Day of Surgery *   Interval: NAEO. Patient still having some RUQ but overall feels better. Eager to have gallbladder removed.   Objective: Vital signs in last 24 hours: Temp:  [97.5 F (36.4 C)-98.4 F (36.9 C)] 97.5 F (36.4 C) (10/27 0600) Pulse Rate:  [68-79] 76 (10/27 0600) Resp:  [15-17] 17 (10/27 0600) BP: (114-133)/(72-83) 133/83 (10/27 0600) SpO2:  [96 %-100 %] 100 % (10/27 0600) Last BM Date : 10/10/24  Intake/Output from previous day: 10/26 0701 - 10/27 0700 In: 592.3 [P.O.:480; I.V.:112.3] Out: 1325 [Urine:1325] Intake/Output this shift: Total I/O In: 240 [P.O.:240] Out: 325 [Urine:325]  Abdomen: Soft mild TTP in RUQ, nondistended  Lab Results:  Recent Labs    10/12/24 0249 10/13/24 0451  WBC 5.3 8.7  HGB 11.6* 10.8*  HCT 36.3* 34.1*  PLT 200 199   BMET Recent Labs    10/12/24 0249 10/13/24 0451  NA 136 136  K 4.1 3.9  CL 103 103  CO2 24 25  GLUCOSE 138* 104*  BUN 13 21  CREATININE 0.99 0.84  CALCIUM  8.8* 8.6*   PT/INR No results for input(s): LABPROT, INR in the last 72 hours. ABG No results for input(s): PHART, HCO3 in the last 72 hours.  Invalid input(s): PCO2, PO2  Studies/Results: No results found.   Anti-infectives: Anti-infectives (From admission, onward)    Start     Dose/Rate Route Frequency Ordered Stop   10/11/24 0600  ceFAZolin  (ANCEF ) IVPB 2g/100 mL premix  Status:  Discontinued        2 g 200 mL/hr over 30 Minutes Intravenous On call to O.R. 10/10/24 1125 10/11/24 0946   10/08/24 2200  piperacillin -tazobactam (ZOSYN ) IVPB 3.375 g  Status:  Discontinued        3.375 g 12.5 mL/hr over 240 Minutes Intravenous Every 8 hours 10/08/24 2050 10/12/24 1128       Assessment/Plan: Gallstone/choledocholithiasis with chronic cholecystitis   Contracted gallbladder may require partial cholecystectomy. Multiple complex medical issues carries risk for operative intervention. Pros and cons have been reviewed with  patient over the weekend and reiterated this AM. Will proceed to OR this AM.  The procedure has been discussed with the patient. Operative and non operative treatments have been discussed. Risks of surgery include bleeding, infection,  Common bile duct injury,  Injury to the stomach,liver, colon,small intestine, abdominal wall,  Diaphragm,  Major blood vessels,  And the need for an open procedure.  Other risks include worsening of medical problems, death,  DVT and pulmonary embolism, and cardiovascular events.   Medical options have also been discussed. The patient has been informed of long term expectations of surgery and non surgical options,  The patient agrees to proceed.      LOS: 6 days    Kyle Silversmith  MD  10/14/2024 High-level complexity

## 2024-10-14 NOTE — Anesthesia Postprocedure Evaluation (Signed)
 Anesthesia Post Note  Patient: Albie Arizpe  Procedure(s) Performed: LAPAROSCOPIC CHOLECYSTECTOMY, SUBTOTAL INDOCYANINE GREEN FLUORESCENCE IMAGING (ICG)     Patient location during evaluation: PACU Anesthesia Type: General Level of consciousness: awake and alert Pain management: pain level controlled Vital Signs Assessment: post-procedure vital signs reviewed and stable Respiratory status: spontaneous breathing, nonlabored ventilation, respiratory function stable and patient connected to nasal cannula oxygen Cardiovascular status: blood pressure returned to baseline and stable Postop Assessment: no apparent nausea or vomiting Anesthetic complications: no   There were no known notable events for this encounter.  Last Vitals:  Vitals:   10/14/24 1000 10/14/24 1024  BP: 122/68 129/69  Pulse: 65 62  Resp: 18 16  Temp: 36.6 C (!) 36.2 C  SpO2: 93% 93%    Last Pain:  Vitals:   10/14/24 0945  TempSrc:   PainSc: Asleep                 [name]

## 2024-10-14 NOTE — Anesthesia Procedure Notes (Signed)
 Procedure Name: Intubation Date/Time: 10/14/2024 7:57 AM  Performed by: Scherrie Mast, CRNAPre-anesthesia Checklist: Patient identified, Emergency Drugs available, Suction available and Patient being monitored Patient Re-evaluated:Patient Re-evaluated prior to induction Oxygen Delivery Method: Circle System Utilized Preoxygenation: Pre-oxygenation with 100% oxygen Induction Type: IV induction Ventilation: Mask ventilation without difficulty Laryngoscope Size: Mac and 4 Grade View: Grade I Tube type: Oral Tube size: 7.0 mm Number of attempts: 1 Airway Equipment and Method: Stylet and Oral airway Placement Confirmation: ETT inserted through vocal cords under direct vision, positive ETCO2 and breath sounds checked- equal and bilateral Tube secured with: Tape Dental Injury: Teeth and Oropharynx as per pre-operative assessment

## 2024-10-14 NOTE — Progress Notes (Signed)
 PROGRESS NOTE Kyle Matthews    DOB: 1946-04-30, 78 y.o.  FMW:[id number]    Code Status: Full Code   DOA: 10/08/2024   LOS: 6  Brief hospital course  Patient is a 78 year old male with past medical history significant for hypertension, hyperlipidemia, CAD status post CABG, bioprosthetic AVR, native tricuspid valve endocarditis s/p TVR, atrial fibrillation and flutter on Eliquis , PPM, history of CVA, COPD, and symptomatic cholelithiasis.  Patient was admitted with recurrent upper abdominal pain, nausea, and vomiting.  Input from the GI and surgery team is highly appreciated.  Surgery team is deciding timing of possible cholecystectomy.   MRCP revealed partial obstruction with cholecystitis. ERCP today. Clinically improved. Will need to discuss if surgical candidate given anticoagulation needs. May be candidate for bili drain.  ERCP revealed: Impression:       - Z-line irregular, 40 cm from the incisors.                           - Gastritis - biopsied.                           - No gross lesions in the duodenal bulb, in the                            first portion of the duodenum and in the second                            portion of the duodenum.                           - The major papilla was adjacent to a diverticulum.                            It was hidden under a hood. Otherwise the major                            papilla appeared normal.                           - A filling defect consistent with a stone and                            sludge was seen on the cholangiogram.                           - The entire main bile duct was mildly dilated.                           - Choledocholithiasis was found. Complete removal                            was accomplished by biliary sphincterotomy and                            balloon sweeping.  10/14/2024: Patient underwent subtotal laparoscopic cholecystectomy.  Surgery team has advised monitoring patient overnight.  Pain is controlled.  Seen alongside patient's wife.  No other constitutional symptoms endorsed.    Assessment & Plan  Principal Problem:   Symptomatic cholelithiasis Active Problems:   Essential hypertension   Coronary artery disease involving native coronary artery of native heart with unstable angina pectoris (HCC)   Chronic heart failure with preserved ejection fraction (HFpEF) (HCC)   History of stroke   Atrial fibrillation and flutter (HCC)   Choledocholithiasis   Elevated LFTs   AKI (acute kidney injury)   Prolonged QT interval   Erosive gastritis   Dilated bile duct   Symptomatic cholelithiasis; concern for choledocholithiasis- MRCP results: Cholelithiasis and partially obstructive choledocholithiasis and sludge, similar to prior. - GI following. ERCP today - continue Zosyn , repeat labs in am, hold Eliquis  and use IV heparin  for now  - gen surg, likely will need cholecystectomy, timing TBD given anticoagulation needs. May be candidate for bili drain. Symptomatically improved.  - advance diet as tolerated post-procedure.  10/14/2024: See above documentation.  Patient underwent subtotal laparoscopic cholecystectomy today.    AKI: - Resolved     Atrial fibrillation, flutter  - S/p DCCV 09/13/24  - Hold Eliquis , continue IV heparin  for now - continue to hold amio. Rate controlled 10/14/2024: Surgery team to advise when to restart Eliquis .  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 Does not require f/u   CAD  - No anginal complaints     Chronic HFpEF  - EF was normal on echo in August 2025    - Appears compensated    Hypertension- BP WNL   Prolonged QT interval  - Last Magnesium  was 2.2    Body mass index is 25.7 kg/m.  VTE ppx: SCD's Start: 10/14/24 0713heparin   Diet:     Diet   Diet clear liquid Fluid consistency: Thin   Diet NPO time specified Except for: [employer], Sips with Meds   Consultants: GI General surgery    Subjective 10/14/24    No complaints   Objective    General: awake, alert, not in any distress. HEENT: Mild pallor.   Respiratory: Clear to auscultation. Cardiovascular: S1-S2.   Gastrointestinal: Soft and nontender.   Nervous: Awake and alert.  Labs   I have personally reviewed the following labs and imaging studies CBC    Component Value Date/Time   WBC 8.7 10/13/2024 0451   RBC 4.21 (L) 10/13/2024 0451   HGB 10.8 (L) 10/13/2024 0451   HCT 34.1 (L) 10/13/2024 0451   PLT 199 10/13/2024 0451   MCV 81.0 10/13/2024 0451   MCH 25.7 (L) 10/13/2024 0451   MCHC 31.7 10/13/2024 0451   RDW 16.3 (H) 10/13/2024 0451   LYMPHSABS 0.7 08/20/2024 1201   MONOABS 0.5 08/20/2024 1201   EOSABS 139 09/11/2024 1353   BASOSABS 73 09/11/2024 1353      Latest Ref Rng & Units 10/13/2024    4:51 AM 10/12/2024    2:49 AM 10/11/2024    3:26 AM  BMP  Glucose 70 - 99 mg/dL 895  861  82   BUN 8 - 23 mg/dL 21  13  9    Creatinine 0.61 - 1.24 mg/dL 9.15  9.00  9.01   Sodium 135 - 145 mmol/L 136  136  134   Potassium 3.5 - 5.1 mmol/L 3.9  4.1  3.5   Chloride 98 - 111 mmol/L 103  103  101   CO2 22 - 32 mmol/L 25  24  23    Calcium   8.9 - 10.3 mg/dL 8.6  8.8  8.7     No results found.   Disposition Plan & Communication  Patient status: Inpatient  Admitted From: Home Planned disposition location: Home Anticipated discharge date: 10/24 pending possible surgical course  Family Communication: none at bedside    Author: Leatrice LILLETTE Chapel, MD [facility] 10/14/2024, 7:43 PM   Available by Epic secure chat 7AM-7PM. If 7PM-7AM, please contact night-coverage.  [facility] contact information found on [email or web address].

## 2024-10-15 ENCOUNTER — Encounter (HOSPITAL_COMMUNITY): Payer: Self-pay | Admitting: General Surgery

## 2024-10-15 ENCOUNTER — Ambulatory Visit: Payer: Self-pay | Admitting: Gastroenterology

## 2024-10-15 DIAGNOSIS — K8020 Calculus of gallbladder without cholecystitis without obstruction: Secondary | ICD-10-CM | POA: Diagnosis not present

## 2024-10-15 LAB — CBC
HCT: 35.1 % — ABNORMAL LOW (ref 39.0–52.0)
Hemoglobin: 11.1 g/dL — ABNORMAL LOW (ref 13.0–17.0)
MCH: 25.8 pg — ABNORMAL LOW (ref 26.0–34.0)
MCHC: 31.6 g/dL (ref 30.0–36.0)
MCV: 81.4 fL (ref 80.0–100.0)
Platelets: 197 K/uL (ref 150–400)
RBC: 4.31 MIL/uL (ref 4.22–5.81)
RDW: 17.1 % — ABNORMAL HIGH (ref 11.5–15.5)
WBC: 13.8 K/uL — ABNORMAL HIGH (ref 4.0–10.5)
nRBC: 0 % (ref 0.0–0.2)

## 2024-10-15 LAB — COMPREHENSIVE METABOLIC PANEL WITH GFR
ALT: 85 U/L — ABNORMAL HIGH (ref 0–44)
AST: 31 U/L (ref 15–41)
Albumin: 2.7 g/dL — ABNORMAL LOW (ref 3.5–5.0)
Alkaline Phosphatase: 279 U/L — ABNORMAL HIGH (ref 38–126)
Anion gap: 10 (ref 5–15)
BUN: 22 mg/dL (ref 8–23)
CO2: 23 mmol/L (ref 22–32)
Calcium: 8.7 mg/dL — ABNORMAL LOW (ref 8.9–10.3)
Chloride: 101 mmol/L (ref 98–111)
Creatinine, Ser: 0.88 mg/dL (ref 0.61–1.24)
GFR, Estimated: >60 mL/min (ref >60)
Glucose, Bld: 122 mg/dL — ABNORMAL HIGH (ref 70–99)
Potassium: 4.3 mmol/L (ref 3.5–5.1)
Sodium: 134 mmol/L — ABNORMAL LOW (ref 135–145)
Total Bilirubin: 0.5 mg/dL (ref 0.0–1.2)
Total Protein: 6.4 g/dL — ABNORMAL LOW (ref 6.5–8.1)

## 2024-10-15 LAB — HEPARIN LEVEL (UNFRACTIONATED)
Heparin Unfractionated: 0.36 [IU]/mL (ref 0.30–0.70)
Heparin Unfractionated: 0.41 [IU]/mL (ref 0.30–0.70)

## 2024-10-15 MED ORDER — METHOCARBAMOL 500 MG PO TABS
500.0000 mg | ORAL_TABLET | Freq: Four times a day (QID) | ORAL | Status: DC
  Administered 2024-10-15 – 2024-10-17 (×9): 500 mg via ORAL
  Filled 2024-10-15 (×9): qty 1

## 2024-10-15 MED ORDER — OXYCODONE HCL 5 MG PO TABS
5.0000 mg | ORAL_TABLET | ORAL | Status: DC | PRN
  Administered 2024-10-15: 10 mg via ORAL
  Filled 2024-10-15: qty 1
  Filled 2024-10-15: qty 2

## 2024-10-15 MED ORDER — ACETAMINOPHEN 500 MG PO TABS
1000.0000 mg | ORAL_TABLET | Freq: Four times a day (QID) | ORAL | Status: DC
  Administered 2024-10-15 – 2024-10-20 (×17): 1000 mg via ORAL
  Filled 2024-10-15 (×20): qty 2

## 2024-10-15 NOTE — Plan of Care (Signed)
 At 0017 patient's heparin  drip was at goal per [place], [facility].  No adjustment to rate was needed, re-check will be done with morning labs. Oxycodone  was given twice during shift. JP drain was emptied and amount of drainage was charted in flowsheets.  Problem: Activity: Goal: Risk for activity intolerance will decrease Outcome: Progressing   Problem: Nutrition: Goal: Adequate nutrition will be maintained Outcome: Progressing   Problem: Coping: Goal: Level of anxiety will decrease Outcome: Progressing   Problem: Elimination: Goal: Will not experience complications related to bowel motility Outcome: Progressing   Problem: Pain Managment: Goal: General experience of comfort will improve and/or be controlled Outcome: Progressing

## 2024-10-15 NOTE — Progress Notes (Addendum)
 1 Day Post-Op  Subjective: CC: Patient reports RUQ abdominal pain and pain around his incisions that is worse when he hiccups or moves in bed. Pain medications are helping with this. Some nausea yesterday that was not related to PO intake. No current nausea. No vomiting. Has not needed anti-nausea medication this morning. No BM. Voiding. Has not been oob.   Objective: Vital signs in last 24 hours: Temp:  [97.2 F (36.2 C)-98.1 F (36.7 C)] 97.8 F (36.6 C) (10/28 0346) Pulse Rate:  [61-85] 85 (10/28 0346) Resp:  [10-18] 18 (10/28 0346) BP: (118-131)/(65-88) 130/88 (10/28 0346) SpO2:  [93 %-98 %] 97 % (10/28 0346) Last BM Date : 10/10/24  Intake/Output from previous day: 10/27 0701 - 10/28 0700 In: 823.1 [P.O.:600; I.V.:123.1; IV Piggyback:100] Out: 1958 [Urine:1700; Drains:258] Intake/Output this shift: No intake/output data recorded.  PE: Gen:  Alert, NAD, pleasant Abd: Soft, mild distension, RUQ ttp and appropriately tender around laparoscopic incisions. Otherwise NT, Incisions with glue intact appears well and are without drainage, bleeding, or signs of infection. JP drain SS - 258cc/24 hours.   Lab Results:  Recent Labs    10/13/24 0451 10/15/24 0553  WBC 8.7 13.8*  HGB 10.8* 11.1*  HCT 34.1* 35.1*  PLT 199 197   BMET Recent Labs    10/13/24 0451 10/15/24 0553  NA 136 134*  K 3.9 4.3  CL 103 101  CO2 25 23  GLUCOSE 104* 122*  BUN 21 22  CREATININE 0.84 0.88  CALCIUM  8.6* 8.7*   PT/INR No results for input(s): LABPROT, INR in the last 72 hours. CMP     Component Value Date/Time   NA 134 (L) 10/15/2024 0553   K 4.3 10/15/2024 0553   CL 101 10/15/2024 0553   CO2 23 10/15/2024 0553   GLUCOSE 122 (H) 10/15/2024 0553   BUN 22 10/15/2024 0553   CREATININE 0.88 10/15/2024 0553   CREATININE 0.91 09/11/2024 1353   CALCIUM  8.7 (L) 10/15/2024 0553   PROT 6.4 (L) 10/15/2024 0553   PROT 6.6 01/29/2024 1633   ALBUMIN  2.7 (L) 10/15/2024 0553    ALBUMIN  4.1 01/29/2024 1633   AST 31 10/15/2024 0553   ALT 85 (H) 10/15/2024 0553   ALKPHOS 279 (H) 10/15/2024 0553   BILITOT 0.5 10/15/2024 0553   BILITOT <0.2 01/29/2024 1633   GFRNONAA >60 10/15/2024 0553   Lipase     Component Value Date/Time   LIPASE 34 10/08/2024 1749    Studies/Results: No results found.  Anti-infectives: Anti-infectives (From admission, onward)    Start     Dose/Rate Route Frequency Ordered Stop   10/14/24 1400  amoxicillin-clavulanate (AUGMENTIN) 875-125 MG per tablet 1 tablet        1 tablet Oral Every 12 hours 10/14/24 1310 10/19/24 0959   10/14/24 0715  ceFAZolin  (ANCEF ) IVPB 2g/100 mL premix        2 g 200 mL/hr over 30 Minutes Intravenous On call to O.R. 10/14/24 0712 10/14/24 0825   10/11/24 0600  ceFAZolin  (ANCEF ) IVPB 2g/100 mL premix  Status:  Discontinued        2 g 200 mL/hr over 30 Minutes Intravenous On call to O.R. 10/10/24 1125 10/11/24 0946   10/08/24 2200  piperacillin -tazobactam (ZOSYN ) IVPB 3.375 g  Status:  Discontinued        3.375 g 12.5 mL/hr over 240 Minutes Intravenous Every 8 hours 10/08/24 2050 10/12/24 1128        Assessment/Plan POD 1 s/p laparoscopic fenestrating subtotal  cholecystectomy by Dr. Ann on 10/14/24 - S/p ERCP with biliary sphincterotomy and balloon sweeping 10/24 by Dr. Wilhelmenia for Choledocholithiasis  - Adv to [facility] diet - LFT's downtrending - Cont abx, 5 days post op - Cont JP, SS currently  - Adjust pain medications - Mobilize, pulm toilet  FEN - HH, IVF per [facility] VTE - SCDs, Heparin  gtt ID - Zosyn  10/21 - 10/25. Ancef  10/27 peri-op. Augmentin 10/27 >> Foley - None, spont void  - Per [facility] -  Hx Atrial fibrillation, flutter - S/p DCCV 09/13/24. 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 Tricuspid endocarditis s/p TVR  Hx CAD s/p CABG  Hx HFpEF Hx HTN Incidental findings - cystic pancreatic lesions, renal cysts, liver cysts     LOS: 7 days    Ozell CHRISTELLA Shaper, [facility] Surgery 10/15/2024, 9:38 AM Please see Amion for pager number during day hours 7:00am-4:30pm

## 2024-10-15 NOTE — Progress Notes (Signed)
 PROGRESS NOTE Kyle Matthews    DOB: 05-04-1946, 78 y.o.  FMW:[id number]    Code Status: Full Code   DOA: 10/08/2024   LOS: 7  Brief hospital course  Patient is a 78 year old male with past medical history significant for hypertension, hyperlipidemia, CAD status post CABG, bioprosthetic AVR, native tricuspid valve endocarditis s/p TVR, atrial fibrillation and flutter on Eliquis , PPM, history of CVA, COPD, and symptomatic cholelithiasis.  Patient was admitted with recurrent upper abdominal pain, nausea, and vomiting.  Input from the GI and surgery team is highly appreciated.  Surgery team is deciding timing of possible cholecystectomy.   MRCP revealed partial obstruction with cholecystitis. ERCP completed on 10/14/2024. Clinically improved. Will need to discuss if surgical candidate given anticoagulation needs. May be candidate for bili drain.  ERCP revealed: Impression:       - Z-line irregular, 40 cm from the incisors.                           - Gastritis - biopsied.                           - No gross lesions in the duodenal bulb, in the                            first portion of the duodenum and in the second                            portion of the duodenum.                           - The major papilla was adjacent to a diverticulum.                            It was hidden under a hood. Otherwise the major                            papilla appeared normal.                           - A filling defect consistent with a stone and                            sludge was seen on the cholangiogram.                           - The entire main bile duct was mildly dilated.                           - Choledocholithiasis was found. Complete removal                            was accomplished by biliary sphincterotomy and                            balloon sweeping.  10/15/2024: The patient was seen and examined at bedside.  Was able to tolerate a clear  liquid diet.  Endorses right lower  quadrant abdominal pain 7 out of 10.  No significant nausea.  Assessment & Plan  Principal Problem:   Symptomatic cholelithiasis Active Problems:   Essential hypertension   Chronic heart failure with preserved ejection fraction (HFpEF) (HCC)   Atrial fibrillation and flutter (HCC)   Coronary artery disease involving native coronary artery of native heart with unstable angina pectoris (HCC)   History of stroke   Choledocholithiasis   Elevated LFTs   AKI (acute kidney injury)   Prolonged QT interval   Erosive gastritis   Dilated bile duct   Symptomatic cholelithiasis; complicated by choledocholithiasis- MRCP results: Cholelithiasis and partially obstructive choledocholithiasis and sludge, similar to prior. - GI following.  Post ERCP on 10/14/2024. - Continue Augmentin, switched from Zosyn  on 10/14/2024, repeat labs in am -Continue to hold Eliquis  and use IV heparin  for now  POD #1 post laparoscopic fenestrating subtotal cholecystectomy by Dr. Ann on 10/14/2024. - advance diet as tolerated post-procedure.   AKI: - Resolved   Avoid nephrotoxic agents, dehydration, and hypotension   Atrial fibrillation, flutter  - S/p DCCV 09/13/24  - Continue to hold Eliquis , continue IV heparin  for now - continue to hold amio. Rate controlled Surgery team to advise when to restart long-acting anticoagulation Eliquis .  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    CAD  No anginal symptoms. Continue to monitor on telemetry.   Chronic HFpEF  - EF was normal on echo in August 2025    - Appears compensated  Euvolemic on exam.   Hypertension- BP WNL Continue to closely monitor vital signs.   Prolonged QT interval  Continue to optimize magnesium  and potassium level. Potassium 4.3 on 10/16/2019. Magnesium  2.2 on 10/09/2024.   Time: 60 minutes.    Body mass index is 25.7 kg/m.  VTE ppx: SCD's Start: 10/14/24 0713heparin   Diet:      Diet   Diet Heart Room service appropriate? Yes; Fluid consistency: Thin   Diet NPO time specified Except for: [employer], Sips with Meds   Consultants: GI General surgery      Objective    General: well-developed well-nourished in no acute stress. HEENT: Mild pallor.   Respiratory: Clear to station no wheezes or rales. Cardiovascular: S1-S2.  Regular rate and rhythm no rubs or gallops. Gastrointestinal: Soft with bowel sounds present. Nervous: Awake and alert.  Labs   I have personally reviewed the following labs and imaging studies CBC    Component Value Date/Time   WBC 13.8 (H) 10/15/2024 0553   RBC 4.31 10/15/2024 0553   HGB 11.1 (L) 10/15/2024 0553   HCT 35.1 (L) 10/15/2024 0553   PLT 197 10/15/2024 0553   MCV 81.4 10/15/2024 0553   MCH 25.8 (L) 10/15/2024 0553   MCHC 31.6 10/15/2024 0553   RDW 17.1 (H) 10/15/2024 0553   LYMPHSABS 0.7 08/20/2024 1201   MONOABS 0.5 08/20/2024 1201   EOSABS 139 09/11/2024 1353   BASOSABS 73 09/11/2024 1353      Latest Ref Rng & Units 10/15/2024    5:53 AM 10/13/2024    4:51 AM 10/12/2024    2:49 AM  BMP  Glucose 70 - 99 mg/dL 877  895  861   BUN 8 - 23 mg/dL 22  21  13    Creatinine 0.61 - 1.24 mg/dL 9.11  9.15  9.00   Sodium 135 - 145 mmol/L 134  136  136   Potassium 3.5 -  5.1 mmol/L 4.3  3.9  4.1   Chloride 98 - 111 mmol/L 101  103  103   CO2 22 - 32 mmol/L 23  25  24    Calcium  8.9 - 10.3 mg/dL 8.7  8.6  8.8     No results found.   Disposition Plan & Communication  Patient status: Inpatient  Admitted From: Home Planned disposition location: Home Anticipated discharge date: Possibly on 10/16/2024 or after general surgery signs off.  Family Communication: none at bedside    Author: Terry LOISE Hurst, MD [facility] 10/15/2024, 3:28 PM   Available by Epic secure chat 7AM-7PM. If 7PM-7AM, please contact night-coverage.  [facility] contact information found on [email or web address].

## 2024-10-15 NOTE — Progress Notes (Signed)
 PHARMACY - ANTICOAGULATION CONSULT NOTE  Pharmacy Consult for heparin  Indication: atrial fibrillation  Allergies  Allergen Reactions   Tetanus Toxoid-Containing Vaccines Other (See Comments)    Childhood Allergy    Zetia  [Ezetimibe ] Diarrhea    Patient Measurements: Height: 5' 3 (160 cm) Weight: 65.8 kg (145 lb 1 oz) IBW/kg (Calculated) : 56.9 HEPARIN  DW (KG): 65.8  Vital Signs: Temp: 97.6 F (36.4 C) (10/27 2347) Temp Source: Oral (10/27 2347) BP: 129/85 (10/27 2347) Pulse Rate: 79 (10/27 2347)  Labs: Recent Labs    10/12/24 0249 10/12/24 1051 10/13/24 0451 10/14/24 0607 10/14/24 2303  HGB 11.6*  --  10.8*  --   --   HCT 36.3*  --  34.1*  --   --   PLT 200  --  199  --   --   APTT 72*  --   --   --   --   HEPARINUNFRC 0.40   < > 0.44 <0.10* 0.36  CREATININE 0.99  --  0.84  --   --    < > = values in this interval not displayed.    Estimated Creatinine Clearance: 60.2 mL/min (by C-G formula based on SCr of 0.84 mg/dL).  Assessment: Patient is a 78 yo male admitted for choledocholithiasis. Patient also with history of atrial fibrillation taking apixaban  PTA. Patient reports last dose of apixaban  was 10/21 AM. Surgery consulted; recommending holding Eliquis  and utilizing heparin  infusion for possibility of procedure.  Plan for MRCP 10/15/2024.    AM: heparin  level at goal s/p restart of heparin  at 800 units/hr. Per RN, no signs/symptoms of bleeding or issues with the heparin  gtt running continuously.   Goal of Therapy:  Heparin  level 0.3-0.7 units/ml aPTT 66-102 seconds Monitor platelets by anticoagulation protocol: Yes   Plan:  Continue heparin  800 units/hr Confirmatory level in 8 hours  Daily heparin  level and CBC Follow up restart of Eliquis  s/p MRCP  Lynwood Poplar, PharmD, BCPS Clinical Pharmacist 10/15/2024 12:15 AM

## 2024-10-15 NOTE — Progress Notes (Signed)
 PHARMACY - ANTICOAGULATION CONSULT NOTE  Pharmacy Consult for heparin  Indication: atrial fibrillation  Allergies  Allergen Reactions   Tetanus Toxoid-Containing Vaccines Other (See Comments)    Childhood Allergy    Zetia  [Ezetimibe ] Diarrhea    Patient Measurements: Height: 5' 3 (160 cm) Weight: 65.8 kg (145 lb 1 oz) IBW/kg (Calculated) : 56.9 HEPARIN  DW (KG): 65.8  Vital Signs: Temp: 97.8 F (36.6 C) (10/28 0346) Temp Source: Oral (10/28 0346) BP: 130/88 (10/28 0346) Pulse Rate: 85 (10/28 0346)  Labs: Recent Labs    10/13/24 0451 10/14/24 0607 10/14/24 2303 10/15/24 0553  HGB 10.8*  --   --  11.1*  HCT 34.1*  --   --  35.1*  PLT 199  --   --  197  HEPARINUNFRC 0.44 <0.10* 0.36 0.41  CREATININE 0.84  --   --  0.88    Estimated Creatinine Clearance: 57.5 mL/min (by C-G formula based on SCr of 0.88 mg/dL).  Assessment: Patient is a 78 yo male admitted for choledocholithiasis. Patient also with history of atrial fibrillation taking apixaban  PTA. Patient reports last dose of apixaban  was 10/21 AM. Surgery consulted; recommending holding Eliquis  and utilizing heparin  infusion for possibility of procedure.  Plan for MRCP 10/15/2024.    10/28 AM update: cHL 0.41 Hgb 11.1 PLT wnl No signs of bleeding / pauses w/ gtt  Goal of Therapy:  Heparin  level 0.3-0.7 units/ml aPTT 66-102 seconds Monitor platelets by anticoagulation protocol: Yes   Plan:  Continue heparin  800 units/hr Daily heparin  level and CBC Follow up restart of Eliquis . Surgery to advise    Linn Goetze BS, PharmD, BCPS Clinical Pharmacist 10/15/2024 7:30 AM  Contact: [phone] after 3 PM

## 2024-10-16 ENCOUNTER — Inpatient Hospital Stay: Payer: Self-pay | Admitting: Internal Medicine

## 2024-10-16 DIAGNOSIS — K8020 Calculus of gallbladder without cholecystitis without obstruction: Secondary | ICD-10-CM | POA: Diagnosis not present

## 2024-10-16 LAB — HEPATIC FUNCTION PANEL
ALT: 43 U/L (ref 0–44)
AST: 14 U/L — ABNORMAL LOW (ref 15–41)
Albumin: 2.2 g/dL — ABNORMAL LOW (ref 3.5–5.0)
Alkaline Phosphatase: 151 U/L — ABNORMAL HIGH (ref 38–126)
Bilirubin, Direct: 0.1 mg/dL (ref 0.0–0.2)
Indirect Bilirubin: 0.4 mg/dL (ref 0.3–0.9)
Total Bilirubin: 0.5 mg/dL (ref 0.0–1.2)
Total Protein: 5.2 g/dL — ABNORMAL LOW (ref 6.5–8.1)

## 2024-10-16 LAB — CBC
HCT: 25.8 % — ABNORMAL LOW (ref 39.0–52.0)
HCT: 25.2 % — ABNORMAL LOW (ref 39.0–52.0)
Hemoglobin: 8 g/dL — ABNORMAL LOW (ref 13.0–17.0)
Hemoglobin: 8.1 g/dL — ABNORMAL LOW (ref 13.0–17.0)
MCH: 25.8 pg — ABNORMAL LOW (ref 26.0–34.0)
MCH: 26.3 pg (ref 26.0–34.0)
MCHC: 31 g/dL (ref 30.0–36.0)
MCHC: 32.1 g/dL (ref 30.0–36.0)
MCV: 83.2 fL (ref 80.0–100.0)
MCV: 81.8 fL (ref 80.0–100.0)
Platelets: 180 K/uL (ref 150–400)
Platelets: 173 K/uL (ref 150–400)
RBC: 3.1 MIL/uL — ABNORMAL LOW (ref 4.22–5.81)
RBC: 3.08 MIL/uL — ABNORMAL LOW (ref 4.22–5.81)
RDW: 17.4 % — ABNORMAL HIGH (ref 11.5–15.5)
RDW: 17.4 % — ABNORMAL HIGH (ref 11.5–15.5)
WBC: 12.9 K/uL — ABNORMAL HIGH (ref 4.0–10.5)
WBC: 12.6 K/uL — ABNORMAL HIGH (ref 4.0–10.5)
nRBC: 0 % (ref 0.0–0.2)
nRBC: 0 % (ref 0.0–0.2)

## 2024-10-16 LAB — HEPARIN LEVEL (UNFRACTIONATED)
Heparin Unfractionated: 0.26 [IU]/mL — ABNORMAL LOW (ref 0.30–0.70)
Heparin Unfractionated: 0.19 [IU]/mL — ABNORMAL LOW (ref 0.30–0.70)

## 2024-10-16 LAB — BASIC METABOLIC PANEL WITH GFR
Anion gap: 12 (ref 5–15)
BUN: 51 mg/dL — ABNORMAL HIGH (ref 8–23)
CO2: 21 mmol/L — ABNORMAL LOW (ref 22–32)
Calcium: 7.9 mg/dL — ABNORMAL LOW (ref 8.9–10.3)
Chloride: 101 mmol/L (ref 98–111)
Creatinine, Ser: 0.94 mg/dL (ref 0.61–1.24)
GFR, Estimated: >60 mL/min (ref >60)
Glucose, Bld: 99 mg/dL (ref 70–99)
Potassium: 4.3 mmol/L (ref 3.5–5.1)
Sodium: 134 mmol/L — ABNORMAL LOW (ref 135–145)

## 2024-10-16 LAB — SURGICAL PATHOLOGY

## 2024-10-16 MED ORDER — POLYETHYLENE GLYCOL 3350 17 G PO PACK
17.0000 g | PACK | Freq: Every day | ORAL | Status: DC
  Administered 2024-10-16: 17 g via ORAL
  Filled 2024-10-16 (×2): qty 1

## 2024-10-16 NOTE — Plan of Care (Signed)
  Problem: Education: Goal: Knowledge of the procedure and recovery process will improve Outcome: Progressing   Problem: Pain Management: Goal: General experience of comfort will improve Outcome: Progressing   Problem: Skin Integrity: Goal: Demonstration of wound healing without infection will improve Outcome: Progressing

## 2024-10-16 NOTE — Progress Notes (Signed)
 PROGRESS NOTE    Kyle Matthews  FMW:[id number] DOB: 11-23-1946 DOA: 10/08/2024 PCP: [name], DO    Brief Narrative:  Patient is a 78 year old male with past medical history significant for hypertension, hyperlipidemia, CAD status post CABG, bioprosthetic AVR, native tricuspid valve endocarditis s/p TVR, atrial fibrillation and flutter on Eliquis , PPM, history of CVA, COPD, and symptomatic cholelithiasis. Patient was admitted with recurrent upper abdominal pain, nausea, and vomiting. Input from the GI and surgery team is highly appreciated.  Patient went to the OR with a laparoscopic fenestrating subtotal colectomy on 10/27  Assessment and Plan: Symptomatic cholelithiasis; complicated by choledocholithiasis- MRCP results: Cholelithiasis and partially obstructive choledocholithiasis and sludge, similar to prior. - GI following.  Post ERCP on 10/14/2024. -On Augmentin for 5 days from 10/27 -Continue to hold Eliquis  and use IV heparin  for now  POD #1 post laparoscopic fenestrating subtotal cholecystectomy by Dr. Ann on 10/14/2024. - advance diet as tolerated post-procedure.    Anemia - Dropped 3 g overnight if labs are correct - Continue to trend  AKI: - Resolved   Avoid nephrotoxic agents, dehydration, and hypotension   Atrial fibrillation, flutter  - S/p DCCV 09/13/24  - Continue to hold Eliquis , continue IV heparin  for now - continue to hold amio. Rate controlled Surgery team to advise when to restart long-acting anticoagulation Eliquis .   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  -Outpatient follow-up   CAD  No anginal symptoms. Continue to monitor on telemetry.   Chronic HFpEF  - EF was normal on echo in August 2025    - Appears compensated  Euvolemic on exam.   Hypertension- BP on lower end of normal so we will hold medications Continue to closely monitor vital signs.   Prolonged QT interval  Continue to  optimize magnesium  and potassium level. Potassium 4.3 on 10/16/2019. Magnesium  2.2 on 10/09/2024.   DVT prophylaxis: Heparin  drip    Code Status: Full Code   Disposition Plan:  Level of care: Telemetry Surgical Status is: Inpatient     Consultants:  General surgery GI    Subjective: Tolerating his breakfast-had oatmeal this a.m.  Objective: Vitals:   10/15/24 1751 10/15/24 2116 10/16/24 0524 10/16/24 1015  BP: 109/69 98/67 95/61  107/67  Pulse: 93 82 88 89  Resp: 18 16  16   Temp: (!) 97.5 F (36.4 C) 97.8 F (36.6 C) 97.7 F (36.5 C) 97.8 F (36.6 C)  TempSrc:  Oral Oral Oral  SpO2: 98% 97% 95% 98%  Weight:      Height:        Intake/Output Summary (Last 24 hours) at 10/16/2024 1057 Last data filed at 10/16/2024 0720 Gross per 24 hour  Intake 1027.68 ml  Output 1125 ml  Net -97.32 ml   Filed Weights   10/08/24 1742 10/11/24 0725 10/14/24 0704  Weight: 65.7 kg 65.8 kg 65.8 kg    Examination:   General: Appearance:     Overweight male in no acute distress     Lungs:     respirations unlabored  Heart:    Normal heart rate.    MS:   All extremities are intact.    Neurologic:   Awake, alert       Data Reviewed: I have personally reviewed following labs and imaging studies  CBC: Recent Labs  Lab 10/11/24 0326 10/12/24 0249 10/13/24 0451 10/15/24 0553 10/16/24 0907  WBC 7.2 5.3 8.7 13.8* 12.9*  HGB 11.8* 11.6* 10.8* 11.1* 8.0*  HCT 37.4* 36.3* 34.1* 35.1* 25.8*  MCV 81.3 80.1 81.0 81.4 83.2  PLT 237 200 199 197 180   Basic Metabolic Panel: Recent Labs  Lab 10/11/24 0326 10/12/24 0249 10/13/24 0451 10/15/24 0553 10/16/24 0907  NA 134* 136 136 134* 134*  K 3.5 4.1 3.9 4.3 4.3  CL 101 103 103 101 101  CO2 23 24 25 23  21*  GLUCOSE 82 138* 104* 122* 99  BUN 9 13 21 22  51*  CREATININE 0.98 0.99 0.84 0.88 0.94  CALCIUM  8.7* 8.8* 8.6* 8.7* 7.9*   GFR: Estimated Creatinine Clearance: 53.8 mL/min (by C-G formula based on SCr of  0.94 mg/dL). Liver Function Tests: Recent Labs  Lab 10/10/24 0522 10/11/24 0326 10/12/24 0249 10/13/24 0451 10/15/24 0553  AST 299* 139* 170* 71* 31  ALT 287* 208* 218* 148* 85*  ALKPHOS 454* 394* 515* 399* 279*  BILITOT 1.0 1.0 0.9 0.6 0.5  PROT 6.7 6.7 6.6 6.0* 6.4*  ALBUMIN  2.8* 2.9* 2.7* 2.6* 2.7*   No results for input(s): LIPASE, AMYLASE in the last 168 hours. No results for input(s): AMMONIA in the last 168 hours. Coagulation Profile: No results for input(s): INR, PROTIME in the last 168 hours. Cardiac Enzymes: No results for input(s): CKTOTAL, CKMB, CKMBINDEX, TROPONINI in the last 168 hours. BNP (last 3 results) No results for input(s): PROBNP in the last 8760 hours. HbA1C: No results for input(s): HGBA1C in the last 72 hours. CBG: No results for input(s): GLUCAP in the last 168 hours. Lipid Profile: No results for input(s): CHOL, HDL, LDLCALC, TRIG, CHOLHDL, LDLDIRECT in the last 72 hours. Thyroid  Function Tests: No results for input(s): TSH, T4TOTAL, FREET4, T3FREE, THYROIDAB in the last 72 hours. Anemia Panel: No results for input(s): VITAMINB12, FOLATE, FERRITIN, TIBC, IRON , RETICCTPCT in the last 72 hours. Sepsis Labs: No results for input(s): PROCALCITON, LATICACIDVEN in the last 168 hours.  Recent Results (from the past 240 hours)  Surgical pcr screen     Status: None   Collection Time: 10/10/24  9:48 PM   Specimen: Nasal Mucosa; Nasal Swab  Result Value Ref Range Status   MRSA, PCR NEGATIVE NEGATIVE Final   Staphylococcus aureus NEGATIVE NEGATIVE Final    Comment: (NOTE) The Xpert SA Assay (FDA approved for NASAL specimens in patients 108 years of age and older), is one component of a comprehensive surveillance program. It is not intended to diagnose infection nor to guide or monitor treatment. Performed at [facility], [address] [place], [place] [address]           Radiology Studies: No results found.      Scheduled Meds:  acetaminophen   1,000 mg Oral Q6H   amoxicillin-clavulanate  1 tablet Oral Q12H   finasteride   5 mg Oral QPM   methocarbamol  500 mg Oral Q6H   pantoprazole   40 mg Oral Daily   sodium chloride  flush  3 mL Intravenous Q12H   Continuous Infusions:  heparin  900 Units/hr (10/16/24 0953)     LOS: 8 days    Time spent: 45 minutes spent on chart review, discussion with nursing staff, consultants, updating family and interview/physical exam; more than 50% of that time was spent in counseling and/or coordination of care.    Harlene RAYMOND Bowl, DO [facility] Available via Epic secure chat 7am-7pm After these hours, please refer to coverage provider listed on [email or web address] 10/16/2024, 10:57 AM

## 2024-10-16 NOTE — Progress Notes (Addendum)
 2 Days Post-Op  Subjective: CC: RUQ abdominal pain and pain near his incisions improved today. No n/v.   Objective: Vital signs in last 24 hours: Temp:  [97.5 F (36.4 C)-97.8 F (36.6 C)] 97.8 F (36.6 C) (10/29 1015) Pulse Rate:  [82-93] 89 (10/29 1015) Resp:  [16-18] 16 (10/29 1015) BP: (95-109)/(61-69) 107/67 (10/29 1015) SpO2:  [95 %-98 %] 98 % (10/29 1015) Last BM Date : 10/10/24  Intake/Output from previous day: 10/28 0701 - 10/29 0700 In: 1360 [P.O.:1360] Out: 1295 [Urine:1150; Drains:145] Intake/Output this shift: Total I/O In: 187.7 [I.V.:187.7] Out: 250 [Urine:250]  PE: Gen:  Alert, NAD, pleasant Abd: Soft, mild distension, RUQ ttp and appropriately tender around laparoscopic incisions. Otherwise NT, Incisions with glue intact appears well and are without drainage, bleeding, or signs of infection. JP drain bilious - 145cc/24 hours.   Lab Results:  Recent Labs    10/15/24 0553 10/16/24 0907  WBC 13.8* 12.9*  HGB 11.1* 8.0*  HCT 35.1* 25.8*  PLT 197 180   BMET Recent Labs    10/15/24 0553 10/16/24 0907  NA 134* 134*  K 4.3 4.3  CL 101 101  CO2 23 21*  GLUCOSE 122* 99  BUN 22 51*  CREATININE 0.88 0.94  CALCIUM  8.7* 7.9*   PT/INR No results for input(s): LABPROT, INR in the last 72 hours. CMP     Component Value Date/Time   NA 134 (L) 10/16/2024 0907   K 4.3 10/16/2024 0907   CL 101 10/16/2024 0907   CO2 21 (L) 10/16/2024 0907   GLUCOSE 99 10/16/2024 0907   BUN 51 (H) 10/16/2024 0907   CREATININE 0.94 10/16/2024 0907   CREATININE 0.91 09/11/2024 1353   CALCIUM  7.9 (L) 10/16/2024 0907   PROT 6.4 (L) 10/15/2024 0553   PROT 6.6 01/29/2024 1633   ALBUMIN  2.7 (L) 10/15/2024 0553   ALBUMIN  4.1 01/29/2024 1633   AST 31 10/15/2024 0553   ALT 85 (H) 10/15/2024 0553   ALKPHOS 279 (H) 10/15/2024 0553   BILITOT 0.5 10/15/2024 0553   BILITOT <0.2 01/29/2024 1633   GFRNONAA >60 10/16/2024 0907   Lipase     Component Value Date/Time    LIPASE 34 10/08/2024 1749    Studies/Results: No results found.  Anti-infectives: Anti-infectives (From admission, onward)    Start     Dose/Rate Route Frequency Ordered Stop   10/14/24 1400  amoxicillin-clavulanate (AUGMENTIN) 875-125 MG per tablet 1 tablet        1 tablet Oral Every 12 hours 10/14/24 1310 10/19/24 0959   10/14/24 0715  ceFAZolin  (ANCEF ) IVPB 2g/100 mL premix        2 g 200 mL/hr over 30 Minutes Intravenous On call to O.R. 10/14/24 0712 10/14/24 0825   10/11/24 0600  ceFAZolin  (ANCEF ) IVPB 2g/100 mL premix  Status:  Discontinued        2 g 200 mL/hr over 30 Minutes Intravenous On call to O.R. 10/10/24 1125 10/11/24 0946   10/08/24 2200  piperacillin -tazobactam (ZOSYN ) IVPB 3.375 g  Status:  Discontinued        3.375 g 12.5 mL/hr over 240 Minutes Intravenous Every 8 hours 10/08/24 2050 10/12/24 1128        Assessment/Plan POD 2 s/p laparoscopic fenestrating subtotal cholecystectomy by Dr. Ann on 10/14/24 - S/p ERCP with biliary sphincterotomy and balloon sweeping 10/24 by Dr. Wilhelmenia for Choledocholithiasis  - Cont abx, 5 days post op - Cont JP. Drain bilious this AM - Patient with bile leak  based on drain output. Output < 300cc/24 hours. No indication for GI consult for ERCP at this time. Monitor output with strict I/O. Would like to keep overnight to monitor output over the next 24 hours to ensure bile leak stays < 300cc/24 hours. Message sent to [facility].  - Repeat labs today and tomorrow  - Mobilize, pulm toilet - Updated family at bedside   FEN - HH, IVF per [facility] VTE - SCDs, Heparin  gtt ID - Zosyn  10/21 - 10/25. Ancef  10/27 peri-op. Augmentin 10/27 >> Foley - None, spont void   - Per [facility] -  Hx Atrial fibrillation, flutter - S/p DCCV 09/13/24. 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 Tricuspid endocarditis s/p TVR  Hx CAD s/p CABG  Hx HFpEF Hx HTN Incidental findings - cystic  pancreatic lesions, renal cysts, liver cysts     LOS: 8 days    Ozell CHRISTELLA Shaper, [facility] Surgery 10/16/2024, 11:21 AM Please see Amion for pager number during day hours 7:00am-4:30pm

## 2024-10-16 NOTE — Plan of Care (Signed)
 No PRN pain meds needed overnight. Heparin  drip infusing at ordered rate with no external signs of bleeding or of complications.  Problem: Pain Management: Goal: General experience of comfort will improve Outcome: Progressing   Problem: Skin Integrity: Goal: Demonstration of wound healing without infection will improve Outcome: Progressing   Problem: Nutrition: Goal: Adequate nutrition will be maintained Outcome: Progressing   Problem: Safety: Goal: Ability to remain free from injury will improve Outcome: Progressing   Problem: Skin Integrity: Goal: Risk for impaired skin integrity will decrease Outcome: Progressing

## 2024-10-16 NOTE — Progress Notes (Signed)
 PHARMACY - ANTICOAGULATION CONSULT NOTE  Pharmacy Consult for heparin  Indication: atrial fibrillation  Allergies  Allergen Reactions   Tetanus Toxoid-Containing Vaccines Other (See Comments)    Childhood Allergy    Zetia  [Ezetimibe ] Diarrhea    Patient Measurements: Height: 5' 3 (160 cm) Weight: 65.8 kg (145 lb 1 oz) IBW/kg (Calculated) : 56.9 HEPARIN  DW (KG): 65.8  Vital Signs: Temp: 98.3 F (36.8 C) (10/29 1731) Temp Source: Oral (10/29 1731) BP: 93/61 (10/29 1731) Pulse Rate: 97 (10/29 1731)  Labs: Recent Labs    10/15/24 0553 10/16/24 0500 10/16/24 0907 10/16/24 1155 10/16/24 1727  HGB 11.1*  --  8.0* 8.1*  --   HCT 35.1*  --  25.8* 25.2*  --   PLT 197  --  180 173  --   HEPARINUNFRC 0.41 0.26*  --   --  0.19*  CREATININE 0.88  --  0.94  --   --     Estimated Creatinine Clearance: 53.8 mL/min (by C-G formula based on SCr of 0.94 mg/dL).  Assessment: Patient is a 78 yo male admitted for choledocholithiasis. Patient also with history of atrial fibrillation taking apixaban  PTA. Patient reports last dose of apixaban  was 10/21 AM. Surgery consulted; recommending holding Eliquis  and utilizing heparin  infusion for possibility of procedure.  Plan for MRCP 10/16/2024.    10/29 PM update: Heparin  level 0.19 No signs of bleeding / infusion issues.  Goal of Therapy:  Heparin  level 0.3-0.7 units/ml aPTT 66-102 seconds Monitor platelets by anticoagulation protocol: Yes   Plan:  increase heparin  to 1050 units/hr HL with AM labs Daily heparin  level and CBC Follow up restart of Eliquis . Surgery to advise    Larraine Brazier, PharmD Clinical Pharmacist 10/16/2024  6:22 PM **Pharmacist phone directory can now be found on [email or web address] ([email or web address]).  Listed under [facility].

## 2024-10-16 NOTE — Progress Notes (Signed)
 PHARMACY - ANTICOAGULATION CONSULT NOTE  Pharmacy Consult for heparin  Indication: atrial fibrillation  Allergies  Allergen Reactions   Tetanus Toxoid-Containing Vaccines Other (See Comments)    Childhood Allergy    Zetia  [Ezetimibe ] Diarrhea    Patient Measurements: Height: 5' 3 (160 cm) Weight: 65.8 kg (145 lb 1 oz) IBW/kg (Calculated) : 56.9 HEPARIN  DW (KG): 65.8  Vital Signs: Temp: 97.7 F (36.5 C) (10/29 0524) Temp Source: Oral (10/29 0524) BP: 95/61 (10/29 0524) Pulse Rate: 88 (10/29 0524)  Labs: Recent Labs    10/14/24 2303 10/15/24 0553 10/16/24 0500  HGB  --  11.1*  --   HCT  --  35.1*  --   PLT  --  197  --   HEPARINUNFRC 0.36 0.41 0.26*  CREATININE  --  0.88  --     Estimated Creatinine Clearance: 57.5 mL/min (by C-G formula based on SCr of 0.88 mg/dL).  Assessment: Patient is a 78 yo male admitted for choledocholithiasis. Patient also with history of atrial fibrillation taking apixaban  PTA. Patient reports last dose of apixaban  was 10/21 AM. Surgery consulted; recommending holding Eliquis  and utilizing heparin  infusion for possibility of procedure.  Plan for MRCP 10/16/2024.    10/29 AM update: cHL 0.26 No signs of bleeding / pauses w/ gtt  Goal of Therapy:  Heparin  level 0.3-0.7 units/ml aPTT 66-102 seconds Monitor platelets by anticoagulation protocol: Yes   Plan:  increase heparin  900 units/hr HL in 8 hours Daily heparin  level and CBC Follow up restart of Eliquis . Surgery to advise    Benedetta Heath BS, PharmD, BCPS Clinical Pharmacist 10/16/2024 7:21 AM  Contact: [phone] after 3 PM

## 2024-10-17 DIAGNOSIS — K8020 Calculus of gallbladder without cholecystitis without obstruction: Secondary | ICD-10-CM | POA: Diagnosis not present

## 2024-10-17 LAB — COMPREHENSIVE METABOLIC PANEL WITH GFR
ALT: 31 U/L (ref 0–44)
AST: 11 U/L — ABNORMAL LOW (ref 15–41)
Albumin: 2.1 g/dL — ABNORMAL LOW (ref 3.5–5.0)
Alkaline Phosphatase: 137 U/L — ABNORMAL HIGH (ref 38–126)
Anion gap: 10 (ref 5–15)
BUN: 53 mg/dL — ABNORMAL HIGH (ref 8–23)
CO2: 23 mmol/L (ref 22–32)
Calcium: 8.2 mg/dL — ABNORMAL LOW (ref 8.9–10.3)
Chloride: 103 mmol/L (ref 98–111)
Creatinine, Ser: 0.97 mg/dL (ref 0.61–1.24)
GFR, Estimated: >60 mL/min (ref >60)
Glucose, Bld: 95 mg/dL (ref 70–99)
Potassium: 3.9 mmol/L (ref 3.5–5.1)
Sodium: 136 mmol/L (ref 135–145)
Total Bilirubin: 0.4 mg/dL (ref 0.0–1.2)
Total Protein: 4.9 g/dL — ABNORMAL LOW (ref 6.5–8.1)

## 2024-10-17 LAB — CBC
HCT: 21 % — ABNORMAL LOW (ref 39.0–52.0)
Hemoglobin: 6.8 g/dL — CL (ref 13.0–17.0)
MCH: 26.3 pg (ref 26.0–34.0)
MCHC: 32.4 g/dL (ref 30.0–36.0)
MCV: 81.1 fL (ref 80.0–100.0)
Platelets: 150 K/uL (ref 150–400)
RBC: 2.59 MIL/uL — ABNORMAL LOW (ref 4.22–5.81)
RDW: 17.5 % — ABNORMAL HIGH (ref 11.5–15.5)
WBC: 12.4 K/uL — ABNORMAL HIGH (ref 4.0–10.5)
nRBC: 0 % (ref 0.0–0.2)

## 2024-10-17 LAB — PREPARE RBC (CROSSMATCH)

## 2024-10-17 LAB — HEMOGLOBIN AND HEMATOCRIT, BLOOD
HCT: 22.5 % — ABNORMAL LOW (ref 39.0–52.0)
HCT: 23.5 % — ABNORMAL LOW (ref 39.0–52.0)
Hemoglobin: 7.1 g/dL — ABNORMAL LOW (ref 13.0–17.0)
Hemoglobin: 7.6 g/dL — ABNORMAL LOW (ref 13.0–17.0)

## 2024-10-17 LAB — HEPARIN LEVEL (UNFRACTIONATED): Heparin Unfractionated: 0.59 [IU]/mL (ref 0.30–0.70)

## 2024-10-17 MED ORDER — METHOCARBAMOL 500 MG PO TABS: 500.0000 mg | ORAL_TABLET | Freq: Four times a day (QID) | ORAL | Status: DC | PRN

## 2024-10-17 MED ORDER — SODIUM CHLORIDE 0.9% IV SOLUTION: Freq: Once | INTRAVENOUS | Status: AC

## 2024-10-17 MED ORDER — MIDODRINE HCL 5 MG PO TABS
2.5000 mg | ORAL_TABLET | Freq: Three times a day (TID) | ORAL | Status: DC
  Administered 2024-10-17 – 2024-10-19 (×7): 2.5 mg via ORAL
  Filled 2024-10-17 (×7): qty 1

## 2024-10-17 MED ORDER — LACTATED RINGERS IV BOLUS
250.0000 mL | Freq: Once | INTRAVENOUS | Status: AC
  Administered 2024-10-17: 250 mL via INTRAVENOUS

## 2024-10-17 NOTE — Plan of Care (Signed)
  Problem: Pain Management: Goal: General experience of comfort will improve Outcome: Progressing   Problem: Education: Goal: Knowledge of General Education information will improve Description: Including pain rating scale, medication(s)/side effects and non-pharmacologic comfort measures Outcome: Progressing   Problem: Health Behavior/Discharge Planning: Goal: Ability to manage health-related needs will improve Outcome: Progressing   Problem: Activity: Goal: Risk for activity intolerance will decrease Outcome: Progressing   Problem: Nutrition: Goal: Adequate nutrition will be maintained Outcome: Progressing   Problem: Elimination: Goal: Will not experience complications related to urinary retention Outcome: Progressing   Problem: Safety: Goal: Ability to remain free from injury will improve Outcome: Progressing

## 2024-10-17 NOTE — Progress Notes (Signed)
 PHARMACY - ANTICOAGULATION CONSULT NOTE  Pharmacy Consult for heparin  Indication: atrial fibrillation  Allergies  Allergen Reactions   Tetanus Toxoid-Containing Vaccines Other (See Comments)    Childhood Allergy    Zetia  [Ezetimibe ] Diarrhea    Patient Measurements: Height: 5' 3 (160 cm) Weight: 65.8 kg (145 lb 1 oz) IBW/kg (Calculated) : 56.9 HEPARIN  DW (KG): 65.8  Vital Signs: Temp: 98.6 F (37 C) (10/30 0521) Temp Source: Oral (10/29 2213) BP: 94/64 (10/30 0521) Pulse Rate: 80 (10/30 0521)  Labs: Recent Labs    10/15/24 0553 10/16/24 0500 10/16/24 0907 10/16/24 1155 10/16/24 1727 10/17/24 0409  HGB 11.1*  --  8.0* 8.1*  --  6.8*  HCT 35.1*  --  25.8* 25.2*  --  21.0*  PLT 197  --  180 173  --  150  HEPARINUNFRC 0.41 0.26*  --   --  0.19* 0.59  CREATININE 0.88  --  0.94  --   --  0.97    Estimated Creatinine Clearance: 52.1 mL/min (by C-G formula based on SCr of 0.97 mg/dL).  Assessment: Patient is a 78 yo male admitted for choledocholithiasis. Patient also with history of atrial fibrillation taking apixaban  PTA. Patient reports last dose of apixaban  was 10/21 AM. Surgery consulted; recommending holding Eliquis  and utilizing heparin  infusion for possibility of procedure.  Plan for MRCP 10/17/2024.    10/30 AM update: Heparin  level 0.59 Hgb 6.8- 1 unit PRBC ordered PLT wnl No overt signs of bleeding noted / no infusion issues.  Goal of Therapy:  Heparin  level 0.3-0.5 units/ml Monitor platelets by anticoagulation protocol: Yes   Plan:  Hold heparin  gtt for now F/up AM 10/31 on ability to restart Daily heparin  level and CBC Follow up restart of Eliquis . Surgery to advise    Benedetta Heath BS, PharmD, BCPS Clinical Pharmacist 10/17/2024 6:42 AM  Contact: [phone] after 3 PM

## 2024-10-17 NOTE — Progress Notes (Signed)
 Pt arrived on unit. Alert and oriented X4. Skin assessment performed with Nena, RN. Vital signs obtained, and CCMD called. Pt in no apparent distress/pain. IV's flushed. RN removed PIV from right hand that was leaking. Call light within reach.

## 2024-10-17 NOTE — Progress Notes (Signed)
 3 Days Post-Op  Subjective: CC: Patient reports last night, when ambulating to the bathroom he developed lightheadedness, pre-syncope, n/v.  Noted to be hypotensive at 86/59. Received 250cc bolus of IVF and BP meds help Repeat CBC w/ hgb 6.8 from 8.1. PRBC ordered but has not been given yet.   He reports stable pain near his incisions today. No worsening pain. He did have a BM yesterday that was loose and brown. No hematochezia or melena. He is tolerating breakfast without n/v. Voiding.   Afebrile currently with HR 80 and BP 94/64 on last vital check.  WBC 12.4 (12.6). Alk Phos 137 (151), AST 11 (14), ALT 31 (43), T. Bili 0.4.   Objective: Vital signs in last 24 hours: Temp:  [97.8 F (36.6 C)-98.6 F (37 C)] 98.6 F (37 C) (10/30 0521) Pulse Rate:  [80-100] 80 (10/30 0521) Resp:  [16-17] 17 (10/30 0521) BP: (86-107)/(58-67) 94/64 (10/30 0521) SpO2:  [95 %-99 %] 99 % (10/30 0521) Last BM Date : 10/16/24  Intake/Output from previous day: 10/29 0701 - 10/30 0700 In: 559.4 [I.V.:361.7; IV Piggyback:197.7] Out: 950 [Urine:700; Emesis/NG output:100; Drains:150] Intake/Output this shift: Total I/O In: -  Out: 300 [Urine:250; Drains:50]  PE: Gen:  Alert, NAD, pleasant Abd: Soft, mild distension, RUQ ttp and appropriately tender around laparoscopic incisions. Otherwise NT. Periumbilical incision with some mild bruising that is soft and without signs of active bleeding. Incisions otherwise with glue intact appears well and are without drainage, bleeding, or signs of infection. JP drain bilious in bulb with some blood tinged fluid in tubing - 150cc/24 hours.   Lab Results:  Recent Labs    10/16/24 1155 10/17/24 0409  WBC 12.6* 12.4*  HGB 8.1* 6.8*  HCT 25.2* 21.0*  PLT 173 150   BMET Recent Labs    10/16/24 0907 10/17/24 0409  NA 134* 136  K 4.3 3.9  CL 101 103  CO2 21* 23  GLUCOSE 99 95  BUN 51* 53*  CREATININE 0.94 0.97  CALCIUM  7.9* 8.2*   PT/INR No  results for input(s): LABPROT, INR in the last 72 hours. CMP     Component Value Date/Time   NA 136 10/17/2024 0409   K 3.9 10/17/2024 0409   CL 103 10/17/2024 0409   CO2 23 10/17/2024 0409   GLUCOSE 95 10/17/2024 0409   BUN 53 (H) 10/17/2024 0409   CREATININE 0.97 10/17/2024 0409   CREATININE 0.91 09/11/2024 1353   CALCIUM  8.2 (L) 10/17/2024 0409   PROT 4.9 (L) 10/17/2024 0409   PROT 6.6 01/29/2024 1633   ALBUMIN  2.1 (L) 10/17/2024 0409   ALBUMIN  4.1 01/29/2024 1633   AST 11 (L) 10/17/2024 0409   ALT 31 10/17/2024 0409   ALKPHOS 137 (H) 10/17/2024 0409   BILITOT 0.4 10/17/2024 0409   BILITOT <0.2 01/29/2024 1633   GFRNONAA >60 10/17/2024 0409   Lipase     Component Value Date/Time   LIPASE 34 10/08/2024 1749    Studies/Results: No results found.  Anti-infectives: Anti-infectives (From admission, onward)    Start     Dose/Rate Route Frequency Ordered Stop   10/14/24 1400  amoxicillin-clavulanate (AUGMENTIN) 875-125 MG per tablet 1 tablet        1 tablet Oral Every 12 hours 10/14/24 1310 10/19/24 0959   10/14/24 0715  ceFAZolin  (ANCEF ) IVPB 2g/100 mL premix        2 g 200 mL/hr over 30 Minutes Intravenous On call to O.R. 10/14/24 9287 10/14/24 0825  10/11/24 0600  ceFAZolin  (ANCEF ) IVPB 2g/100 mL premix  Status:  Discontinued        2 g 200 mL/hr over 30 Minutes Intravenous On call to O.R. 10/10/24 1125 10/11/24 0946   10/08/24 2200  piperacillin -tazobactam (ZOSYN ) IVPB 3.375 g  Status:  Discontinued        3.375 g 12.5 mL/hr over 240 Minutes Intravenous Every 8 hours 10/08/24 2050 10/12/24 1128        Assessment/Plan POD 3 s/p laparoscopic fenestrating subtotal cholecystectomy by Dr. Ann on 10/14/24 - S/p ERCP with biliary sphincterotomy and balloon sweeping 10/24 by Dr. Wilhelmenia for Choledocholithiasis  - Cont abx, 5 days post op - Cont JP - Patient with bile leak based on drain output. Output < 300cc/24 hours. No indication for GI consult for ERCP  at this time. Monitor output with strict I/O.  - LFT's downtrending - Hgb 6.8 this AM and hypotensive overnight. Heparin  gtt and BP meds held. Some bruising around umbilical incision but I don't think this explains his drop in hgb from 11.1 on 10/28. No external signs of bleeding. He denies bloody BM's or melena to suggest post sphincterotomy bleeding. There is some blood tinged fluid in tubing of JP but fluid in bulb is bilious and non-bloody appearing. Agree with holding heparin  gtt and giving PRBC. Will discuss with MD if we should obtain CT A/P to further investigate.  - Mobilize, pulm toilet   FEN - Okay for [facility] diet for now, IVF per [facility] VTE - SCDs, Heparin  gtt on hold ID - Zosyn  10/21 - 10/25. Ancef  10/27 peri-op. Augmentin 10/27 >> Foley - None, spont void   - Per [facility] -  Hx Atrial fibrillation, flutter - S/p DCCV 09/13/24. 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 Tricuspid endocarditis s/p TVR  Hx CAD s/p CABG  Hx HFpEF Hx HTN Incidental findings - cystic pancreatic lesions, renal cysts, liver cysts     LOS: 9 days    Ozell CHRISTELLA Shaper, [facility] Surgery 10/17/2024, 8:33 AM Please see Amion for pager number during day hours 7:00am-4:30pm

## 2024-10-17 NOTE — Progress Notes (Signed)
 PROGRESS NOTE    Kyle Matthews  FMW:[id number] DOB: [birth year] DOA: 10/08/2024 PCP: [name], DO    Brief Narrative:  Patient is a 78 year old male with past medical history significant for hypertension, hyperlipidemia, CAD status post CABG, bioprosthetic AVR, native tricuspid valve endocarditis s/p TVR, atrial fibrillation and flutter on Eliquis , PPM, history of CVA, COPD, and symptomatic cholelithiasis. Patient was admitted with recurrent upper abdominal pain, nausea, and vomiting. Input from the GI and surgery team is highly appreciated.  Patient went to the OR with a laparoscopic fenestrating subtotal colectomy on 10/27  Assessment and Plan: Symptomatic cholelithiasis; complicated by choledocholithiasis- MRCP results: Cholelithiasis and partially obstructive choledocholithiasis and sludge, similar to prior. - GI following.  Post ERCP on 10/14/2024. -On Augmentin for 5 days from 10/27 -Continue to hold Eliquis  and use IV heparin  for now  POD #1 post laparoscopic fenestrating subtotal cholecystectomy by Dr. Ann on 10/14/2024. - advance diet as tolerated post-procedure.    Anemia with hypotension - Dropped again overnight -no source found-no GI bleeding, no back pain, mild bruising around incision sites - Discussed with general surgery will hold off on CT scan for now and continue to monitor - Continue to trend -Heparin  drip on hold for now  AKI: - Resolved   Avoid nephrotoxic agents, dehydration, and hypotension   Atrial fibrillation, flutter  - S/p DCCV 09/13/24  - Continue to hold Eliquis , holding heparin  until hemoglobin greater than 8 - continue to hold amio. Rate controlled Surgery team to advise when to restart long-acting anticoagulation Eliquis .   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  -Outpatient follow-up   CAD  No anginal symptoms. Continue to monitor on telemetry.   Chronic HFpEF  - EF was normal on  echo in August 2025    - Appears compensated  Euvolemic on exam.   Hypertension- BP on lower end of normal so we will hold medications Continue to closely monitor vital signs.   Prolonged QT interval  Continue to optimize magnesium  and potassium level. Potassium 4.3 on 10/16/2019. Magnesium  2.2 on 10/09/2024.   DVT prophylaxis: Heparin  drip    Code Status: Full Code   Disposition Plan:  Level of care: Progressive Status is: Inpatient     Consultants:  General surgery GI    Subjective: Denies back pain Denies dark stools  Objective: Vitals:   10/17/24 0521 10/17/24 0945 10/17/24 1005 10/17/24 1020  BP: 94/64 91/67 (!) 89/61 (!) 89/61  Pulse: 80 86 81 81  Resp: 17 18 17 17   Temp: 98.6 F (37 C) 98 F (36.7 C) 98 F (36.7 C) 98 F (36.7 C)  TempSrc:  Oral Oral   SpO2: 99% 100% 100%   Weight:      Height:        Intake/Output Summary (Last 24 hours) at 10/17/2024 1224 Last data filed at 10/17/2024 0945 Gross per 24 hour  Intake 686.67 ml  Output 1000 ml  Net -313.33 ml   Filed Weights   10/08/24 1742 10/11/24 0725 10/14/24 0704  Weight: 65.7 kg 65.8 kg 65.8 kg    Examination:   General: Appearance:     Overweight male in no acute distress     Lungs:     respirations unlabored  Heart:    Normal heart rate.    MS:   All extremities are intact.    Neurologic:   Awake, alert       Data Reviewed: I have personally reviewed following  labs and imaging studies  CBC: Recent Labs  Lab 10/13/24 0451 10/15/24 0553 10/16/24 0907 10/16/24 1155 10/17/24 0409 10/17/24 0959  WBC 8.7 13.8* 12.9* 12.6* 12.4*  --   HGB 10.8* 11.1* 8.0* 8.1* 6.8* 7.1*  HCT 34.1* 35.1* 25.8* 25.2* 21.0* 22.5*  MCV 81.0 81.4 83.2 81.8 81.1  --   PLT 199 197 180 173 150  --    Basic Metabolic Panel: Recent Labs  Lab 10/12/24 0249 10/13/24 0451 10/15/24 0553 10/16/24 0907 10/17/24 0409  NA 136 136 134* 134* 136  K 4.1 3.9 4.3 4.3 3.9  CL 103 103 101 101 103   CO2 24 25 23  21* 23  GLUCOSE 138* 104* 122* 99 95  BUN 13 21 22  51* 53*  CREATININE 0.99 0.84 0.88 0.94 0.97  CALCIUM  8.8* 8.6* 8.7* 7.9* 8.2*   GFR: Estimated Creatinine Clearance: 52.1 mL/min (by C-G formula based on SCr of 0.97 mg/dL). Liver Function Tests: Recent Labs  Lab 10/12/24 0249 10/13/24 0451 10/15/24 0553 10/16/24 1155 10/17/24 0409  AST 170* 71* 31 14* 11*  ALT 218* 148* 85* 43 31  ALKPHOS 515* 399* 279* 151* 137*  BILITOT 0.9 0.6 0.5 0.5 0.4  PROT 6.6 6.0* 6.4* 5.2* 4.9*  ALBUMIN  2.7* 2.6* 2.7* 2.2* 2.1*   No results for input(s): LIPASE, AMYLASE in the last 168 hours. No results for input(s): AMMONIA in the last 168 hours. Coagulation Profile: No results for input(s): INR, PROTIME in the last 168 hours. Cardiac Enzymes: No results for input(s): CKTOTAL, CKMB, CKMBINDEX, TROPONINI in the last 168 hours. BNP (last 3 results) No results for input(s): PROBNP in the last 8760 hours. HbA1C: No results for input(s): HGBA1C in the last 72 hours. CBG: No results for input(s): GLUCAP in the last 168 hours. Lipid Profile: No results for input(s): CHOL, HDL, LDLCALC, TRIG, CHOLHDL, LDLDIRECT in the last 72 hours. Thyroid  Function Tests: No results for input(s): TSH, T4TOTAL, FREET4, T3FREE, THYROIDAB in the last 72 hours. Anemia Panel: No results for input(s): VITAMINB12, FOLATE, FERRITIN, TIBC, IRON , RETICCTPCT in the last 72 hours. Sepsis Labs: No results for input(s): PROCALCITON, LATICACIDVEN in the last 168 hours.  Recent Results (from the past 240 hours)  Surgical pcr screen     Status: None   Collection Time: 10/10/24  9:48 PM   Specimen: Nasal Mucosa; Nasal Swab  Result Value Ref Range Status   MRSA, PCR NEGATIVE NEGATIVE Final   Staphylococcus aureus NEGATIVE NEGATIVE Final    Comment: (NOTE) The Xpert SA Assay (FDA approved for NASAL specimens in patients 84 years of age and older), is  one component of a comprehensive surveillance program. It is not intended to diagnose infection nor to guide or monitor treatment. Performed at [facility], [address], [place], [place] [address]          Radiology Studies: No results found.      Scheduled Meds:  acetaminophen   1,000 mg Oral Q6H   amoxicillin-clavulanate  1 tablet Oral Q12H   finasteride   5 mg Oral QPM   midodrine   2.5 mg Oral TID WC   pantoprazole   40 mg Oral Daily   polyethylene glycol  17 g Oral Daily   sodium chloride  flush  3 mL Intravenous Q12H   Continuous Infusions:     LOS: 9 days    Time spent: 45 minutes spent on chart review, discussion with nursing staff, consultants, updating family and interview/physical exam; more than 50% of that time was  spent in counseling and/or coordination of care.    Harlene RAYMOND Bowl, DO [facility] Available via Epic secure chat 7am-7pm After these hours, please refer to coverage provider listed on [email or web address] 10/17/2024, 12:24 PM

## 2024-10-17 NOTE — Progress Notes (Signed)
 Patient is tolerating blood admin well with no complaints. Vitals rechecked, afebrile, hypotensive at 89/61, MD made aware. 02 at 100% on RA. All needs met at this time. Bed in lowest position and call light within reach.

## 2024-10-17 NOTE — Progress Notes (Signed)
 Patient blood admin completed, tolerated well with no suspected adverse reaction. Vitals rechecked, BP is now 93/61, 02 100% on RA, afebrile, no complaints of pain. Bed in lowest position with call light within reach.

## 2024-10-17 NOTE — Progress Notes (Signed)
 Blood administration of 1 unit of RBC has been started per order. Patient is complaining of a 3/10 pain located in the abdomen at the surgical site. Patient is afebrile pre-blood.

## 2024-10-17 NOTE — Progress Notes (Addendum)
     Patient Name: Kyle Matthews           DOB: Jul 23, 1946  MRN: [id number]      Admission Date: 10/08/2024  Attending Provider: Juvenal Harlene PENNER, DO  Primary Diagnosis: Symptomatic cholelithiasis   Level of care: Telemetry Surgical   OVERNIGHT EVENT   HPI/ Events of Note  Kyle Matthews, 78 y.o. male, was admitted on 10/08/2024 for Symptomatic cholelithiasis.   Notified by nursing staff of SBP in the 80s.   BP has been on the lower end of normal.  Antihypertensive meds on hold. All other vitals stable, afebrile.  Asymptomatic with good urine output. Hemoglobin has been dropping, on heparin  gtt.  Will obtain CBC now.  No overt bleeding reported.   Plan: Patient has CHF, avoid excessive fluids.  Added on small bolus.  Obtain CBC   Addendum: S/p bolus- 94/64 Hemoglobin 6.8.  Added 1 unit PRBC.   Kyle Ohm, DNP, ACNPC- AG [facility] [place]

## 2024-10-18 DIAGNOSIS — K8020 Calculus of gallbladder without cholecystitis without obstruction: Secondary | ICD-10-CM | POA: Diagnosis not present

## 2024-10-18 LAB — BPAM RBC
Blood Product Expiration Date: 202511092359
Blood Product Expiration Date: 202511212359
ISSUE DATE / TIME: 202510300943
ISSUE DATE / TIME: 202510302053
Unit Type and Rh: 6200
Unit Type and Rh: 8400

## 2024-10-18 LAB — BASIC METABOLIC PANEL WITH GFR
Anion gap: 11 (ref 5–15)
BUN: 36 mg/dL — ABNORMAL HIGH (ref 8–23)
CO2: 22 mmol/L (ref 22–32)
Calcium: 8.5 mg/dL — ABNORMAL LOW (ref 8.9–10.3)
Chloride: 105 mmol/L (ref 98–111)
Creatinine, Ser: 1.02 mg/dL (ref 0.61–1.24)
GFR, Estimated: >60 mL/min (ref >60)
Glucose, Bld: 94 mg/dL (ref 70–99)
Potassium: 4.2 mmol/L (ref 3.5–5.1)
Sodium: 138 mmol/L (ref 135–145)

## 2024-10-18 LAB — TYPE AND SCREEN
ABO/RH(D): AB POS
Antibody Screen: NEGATIVE
Unit division: 0
Unit division: 0

## 2024-10-18 LAB — CBC
HCT: 27 % — ABNORMAL LOW (ref 39.0–52.0)
Hemoglobin: 8.8 g/dL — ABNORMAL LOW (ref 13.0–17.0)
MCH: 27.3 pg (ref 26.0–34.0)
MCHC: 32.6 g/dL (ref 30.0–36.0)
MCV: 83.9 fL (ref 80.0–100.0)
Platelets: 152 K/uL (ref 150–400)
RBC: 3.22 MIL/uL — ABNORMAL LOW (ref 4.22–5.81)
RDW: 16.9 % — ABNORMAL HIGH (ref 11.5–15.5)
WBC: 9.8 K/uL (ref 4.0–10.5)
nRBC: 0 % (ref 0.0–0.2)

## 2024-10-18 LAB — HEPARIN LEVEL (UNFRACTIONATED)
Heparin Unfractionated: <0.1 [IU]/mL — ABNORMAL LOW (ref 0.30–0.70)
Heparin Unfractionated: 0.49 [IU]/mL (ref 0.30–0.70)

## 2024-10-18 LAB — OCCULT BLOOD X 1 CARD TO LAB, STOOL: Fecal Occult Bld: POSITIVE — AB

## 2024-10-18 LAB — HEMOGLOBIN AND HEMATOCRIT, BLOOD
HCT: 27.4 % — ABNORMAL LOW (ref 39.0–52.0)
Hemoglobin: 8.6 g/dL — ABNORMAL LOW (ref 13.0–17.0)

## 2024-10-18 MED ORDER — HEPARIN (PORCINE) 25000 UT/250ML-% IV SOLN
900.0000 [IU]/h | INTRAVENOUS | Status: DC
  Administered 2024-10-18: 1050 [IU]/h via INTRAVENOUS
  Administered 2024-10-19: 900 [IU]/h via INTRAVENOUS
  Filled 2024-10-18 (×2): qty 250

## 2024-10-18 NOTE — Progress Notes (Signed)
 PHARMACY - ANTICOAGULATION CONSULT NOTE  Pharmacy Consult for heparin  Indication: atrial fibrillation  Allergies  Allergen Reactions   Tetanus Toxoid-Containing Vaccines Other (See Comments)    Childhood Allergy    Zetia  [Ezetimibe ] Diarrhea    Patient Measurements: Height: 5' 3 (160 cm) Weight: 65.8 kg (145 lb 1 oz) IBW/kg (Calculated) : 56.9 HEPARIN  DW (KG): 65.8  Vital Signs: Temp: 97.9 F (36.6 C) (10/31 0744) Temp Source: Tympanic (10/31 0744) BP: 105/70 (10/31 0744) Pulse Rate: 75 (10/31 0744)  Labs: Recent Labs    10/16/24 0907 10/16/24 1155 10/16/24 1727 10/17/24 0409 10/17/24 0959 10/17/24 1500 10/18/24 0310  HGB 8.0* 8.1*  --  6.8* 7.1* 7.6* 8.8*  HCT 25.8* 25.2*  --  21.0* 22.5* 23.5* 27.0*  PLT 180 173  --  150  --   --  152  HEPARINUNFRC  --   --  0.19* 0.59  --   --  <0.10*  CREATININE 0.94  --   --  0.97  --   --  1.02    Estimated Creatinine Clearance: 49.6 mL/min (by C-G formula based on SCr of 1.02 mg/dL).  Assessment: Patient is a 78 yo male admitted for choledocholithiasis. Patient also with history of atrial fibrillation taking apixaban  PTA. Patient reports last dose of apixaban  was 10/21 AM. Surgery consulted; s/p procedure - resuming heparin  now s/p HgB recovery.  Goal of Therapy:  Heparin  level 0.3-0.5 units/ml Monitor platelets by anticoagulation protocol: Yes   Plan:  Heparin  at 1050 units / hr 8 hour heparin  level Daily heparin  level and CBC Follow up restart of Eliquis .   Thank you Olam Monte, PharmD 10/18/2024 9:58 AM  Contact: [phone] after 3 PM

## 2024-10-18 NOTE — Progress Notes (Signed)
 Mobility Specialist Progress Note:    10/18/24 0853  Mobility  Activity Stood at bedside;Dangled on edge of bed  Level of Assistance Standby assist, set-up cues, supervision of patient - no hands on  Assistive Device Front wheel walker  Activity Response Tolerated well  Mobility Referral Yes  Mobility visit 1 Mobility  Mobility Specialist Start Time (ACUTE ONLY) [id number]  Mobility Specialist Stop Time (ACUTE ONLY) 0901  Mobility Specialist Time Calculation (min) (ACUTE ONLY) 8 min   Pt received in bed, agreeable to mobility session. Performed orthostatics. See below. Tolerated well, denies dizziness when standing. Returned pt to EOB for morning meds. Left in care of RN, all needs met.   Supine: 96/67 (76) Dangling EOB: 104/66 (79) Standing:105/61 (75)  Shaune Malacara Mobility Specialist Please contact via Special Educational Needs Teacher or  Rehab office at [phone]

## 2024-10-18 NOTE — Progress Notes (Signed)
 4 Days Post-Op  Interval: Hemodynamically stable, Hb up to 8.8 this AM.  Objective: Vital signs in last 24 hours: Temp:  [97.8 F (36.6 C)-98.6 F (37 C)] 97.9 F (36.6 C) (10/31 0744) Pulse Rate:  [73-90] 75 (10/31 0744) Resp:  [15-18] 16 (10/31 0744) BP: (89-108)/(58-70) 105/70 (10/31 0744) SpO2:  [93 %-100 %] 97 % (10/31 0744) Last BM Date : 10/16/24  Intake/Output from previous day: 10/30 0701 - 10/31 0700 In: 893 [I.V.:12.2; Blood:880.8] Out: 1140 [Urine:1050; Drains:90] Intake/Output this shift: Total I/O In: -  Out: 310 [Urine:300; Drains:10]  PE: Gen:  Alert, NAD, pleasant Abd: Soft, mild distension, RUQ ttp especially around drain insertion site and appropriately tender around laparoscopic incisions. . Periumbilical incision with some mild bruising that is soft and without signs of active bleeding. Incisions otherwise with glue intact appears well and are without drainage, bleeding, or signs of infection. JP drain bilious with 90cc over last 24h  Lab Results:  Recent Labs    10/17/24 0409 10/17/24 0959 10/17/24 1500 10/18/24 0310  WBC 12.4*  --   --  9.8  HGB 6.8*   < > 7.6* 8.8*  HCT 21.0*   < > 23.5* 27.0*  PLT 150  --   --  152   < > = values in this interval not displayed.   BMET Recent Labs    10/17/24 0409 10/18/24 0310  NA 136 138  K 3.9 4.2  CL 103 105  CO2 23 22  GLUCOSE 95 94  BUN 53* 36*  CREATININE 0.97 1.02  CALCIUM  8.2* 8.5*   PT/INR No results for input(s): LABPROT, INR in the last 72 hours. CMP     Component Value Date/Time   NA 138 10/18/2024 0310   K 4.2 10/18/2024 0310   CL 105 10/18/2024 0310   CO2 22 10/18/2024 0310   GLUCOSE 94 10/18/2024 0310   BUN 36 (H) 10/18/2024 0310   CREATININE 1.02 10/18/2024 0310   CREATININE 0.91 09/11/2024 1353   CALCIUM  8.5 (L) 10/18/2024 0310   PROT 4.9 (L) 10/17/2024 0409   PROT 6.6 01/29/2024 1633   ALBUMIN  2.1 (L) 10/17/2024 0409   ALBUMIN  4.1 01/29/2024 1633   AST 11  (L) 10/17/2024 0409   ALT 31 10/17/2024 0409   ALKPHOS 137 (H) 10/17/2024 0409   BILITOT 0.4 10/17/2024 0409   BILITOT <0.2 01/29/2024 1633   GFRNONAA >60 10/18/2024 0310   Lipase     Component Value Date/Time   LIPASE 34 10/08/2024 1749    Studies/Results: No results found.  Anti-infectives: Anti-infectives (From admission, onward)    Start     Dose/Rate Route Frequency Ordered Stop   10/14/24 1400  amoxicillin-clavulanate (AUGMENTIN) 875-125 MG per tablet 1 tablet        1 tablet Oral Every 12 hours 10/14/24 1310 10/19/24 0959   10/14/24 0715  ceFAZolin  (ANCEF ) IVPB 2g/100 mL premix        2 g 200 mL/hr over 30 Minutes Intravenous On call to O.R. 10/14/24 0712 10/14/24 0825   10/11/24 0600  ceFAZolin  (ANCEF ) IVPB 2g/100 mL premix  Status:  Discontinued        2 g 200 mL/hr over 30 Minutes Intravenous On call to O.R. 10/10/24 1125 10/11/24 0946   10/08/24 2200  piperacillin -tazobactam (ZOSYN ) IVPB 3.375 g  Status:  Discontinued        3.375 g 12.5 mL/hr over 240 Minutes Intravenous Every 8 hours 10/08/24 2050 10/12/24 1128  Assessment/Plan POD 4 s/p laparoscopic fenestrating subtotal cholecystectomy by Dr. Ann on 10/14/24 - S/p ERCP with biliary sphincterotomy and balloon sweeping 10/24 by Dr. Wilhelmenia for Choledocholithiasis  - Cont abx through remaining dose this evening. Then no further antibiotics needs - Cont JP - Patient with bile leak based on drain output. Output < 300cc/24 hours. No indication for GI consult for ERCP at this time. Monitor output with strict I/O.  - LFTs not ordered this AM, given downtrending previously will just order for tomorrow AM  - H/H stable, improved post transfusion. HDS. - Mobilize, pulm toilet   FEN - Okay for [facility] diet for now, IVF per [facility] VTE - SCDs, ok to restart heparin  gtt ID - Zosyn  10/21 - 10/25. Ancef  10/27 peri-op. Augmentin 10/27 >> 10/31, last dose in PM Foley - None, spont void   If H/H stable and drain  remains nonbloody with restarting heparin  gtt, can likely transition back to Eliquis  in next 24h and dc home with drain which we will follow in our clinic   - Per [facility] -  Hx Atrial fibrillation, flutter - S/p DCCV 09/13/24. 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 Tricuspid endocarditis s/p TVR  Hx CAD s/p CABG  Hx HFpEF Hx HTN Incidental findings - cystic pancreatic lesions, renal cysts, liver cysts     LOS: 10 days    Richerd Ann, MD [facility] 10/18/2024, 9:33 AM Please see Amion for pager number during day hours 7:00am-4:30pm

## 2024-10-18 NOTE — Progress Notes (Signed)
 PROGRESS NOTE    Kyle Matthews  FMW:[id number] DOB: Oct 26, 1946 DOA: 10/08/2024 PCP: [name], DO    Brief Narrative:  Patient is a 78 year old male with past medical history significant for hypertension, hyperlipidemia, CAD status post CABG, bioprosthetic AVR, native tricuspid valve endocarditis s/p TVR, atrial fibrillation and flutter on Eliquis , PPM, history of CVA, COPD, and symptomatic cholelithiasis. Patient was admitted with recurrent upper abdominal pain, nausea, and vomiting. Input from the GI and surgery team is highly appreciated.  Patient went to the OR with a laparoscopic fenestrating subtotal colectomy on 10/27  Assessment and Plan: Symptomatic cholelithiasis; complicated by choledocholithiasis- MRCP results: Cholelithiasis and partially obstructive choledocholithiasis and sludge, similar to prior. - GI following.  Post ERCP on 10/14/2024. -On Augmentin for 5 days from 10/27 -Continue to hold Eliquis  and resume IV heparin , if after a day or 2 patient's hemoglobin stable will resume Eliquis  POD #1 post laparoscopic fenestrating subtotal cholecystectomy by Dr. Ann on 10/14/2024. - Tolerating diet   Anemia with hypotension - No source found, status post 2 units total with improvement of blood pressure and hemoglobin Continue to trend hemoglobin  AKI: - Resolved   Avoid nephrotoxic agents, dehydration, and hypotension   Atrial fibrillation, flutter  - S/p DCCV 09/13/24  - Continue to hold Eliquis , holding heparin  until hemoglobin greater than 8 - continue to hold amio. Rate controlled Surgery team to advise when to restart long-acting anticoagulation Eliquis .   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  -Outpatient follow-up   CAD  No anginal symptoms. Continue to monitor on telemetry.   Chronic HFpEF  - EF was normal on echo in August 2025    - Appears compensated  Euvolemic on exam.   Hypertension- BP on  lower end of normal so we will hold medications Continue to closely monitor vital signs.   Prolonged QT interval  Continue to optimize magnesium  and potassium level. Potassium 4.3 on 10/16/2019. Magnesium  2.2 on 10/09/2024.   DVT prophylaxis: Heparin  drip    Code Status: Full Code   Disposition Plan:  Level of care: Progressive Status is: Inpatient     Consultants:  General surgery GI    Subjective: No longer with vomiting or nausea  Objective: Vitals:   10/17/24 2311 10/17/24 2315 10/18/24 0317 10/18/24 0744  BP: 91/61 91/61 108/67 105/70  Pulse: 80 80 79 75  Resp: 18 15 15 16   Temp: 97.9 F (36.6 C) 97.9 F (36.6 C) 97.8 F (36.6 C) 97.9 F (36.6 C)  TempSrc: Oral Oral Oral Tympanic  SpO2: 98% 97% 98% 97%  Weight:      Height:        Intake/Output Summary (Last 24 hours) at 10/18/2024 1055 Last data filed at 10/18/2024 0745 Gross per 24 hour  Intake 578.01 ml  Output 1150 ml  Net -571.99 ml   Filed Weights   10/08/24 1742 10/11/24 0725 10/14/24 0704  Weight: 65.7 kg 65.8 kg 65.8 kg    Examination:   General: Appearance:     Overweight male in no acute distress     Lungs:     respirations unlabored  Heart:    Normal heart rate.    MS:   All extremities are intact.    Neurologic:   Awake, alert       Data Reviewed: I have personally reviewed following labs and imaging studies  CBC: Recent Labs  Lab 10/15/24 0553 10/16/24 0907 10/16/24 1155 10/17/24 0409 10/17/24 0959 10/17/24  1500 10/18/24 0310  WBC 13.8* 12.9* 12.6* 12.4*  --   --  9.8  HGB 11.1* 8.0* 8.1* 6.8* 7.1* 7.6* 8.8*  HCT 35.1* 25.8* 25.2* 21.0* 22.5* 23.5* 27.0*  MCV 81.4 83.2 81.8 81.1  --   --  83.9  PLT 197 180 173 150  --   --  152   Basic Metabolic Panel: Recent Labs  Lab 10/13/24 0451 10/15/24 0553 10/16/24 0907 10/17/24 0409 10/18/24 0310  NA 136 134* 134* 136 138  K 3.9 4.3 4.3 3.9 4.2  CL 103 101 101 103 105  CO2 25 23 21* 23 22  GLUCOSE 104*  122* 99 95 94  BUN 21 22 51* 53* 36*  CREATININE 0.84 0.88 0.94 0.97 1.02  CALCIUM  8.6* 8.7* 7.9* 8.2* 8.5*   GFR: Estimated Creatinine Clearance: 49.6 mL/min (by C-G formula based on SCr of 1.02 mg/dL). Liver Function Tests: Recent Labs  Lab 10/12/24 0249 10/13/24 0451 10/15/24 0553 10/16/24 1155 10/17/24 0409  AST 170* 71* 31 14* 11*  ALT 218* 148* 85* 43 31  ALKPHOS 515* 399* 279* 151* 137*  BILITOT 0.9 0.6 0.5 0.5 0.4  PROT 6.6 6.0* 6.4* 5.2* 4.9*  ALBUMIN  2.7* 2.6* 2.7* 2.2* 2.1*   No results for input(s): LIPASE, AMYLASE in the last 168 hours. No results for input(s): AMMONIA in the last 168 hours. Coagulation Profile: No results for input(s): INR, PROTIME in the last 168 hours. Cardiac Enzymes: No results for input(s): CKTOTAL, CKMB, CKMBINDEX, TROPONINI in the last 168 hours. BNP (last 3 results) No results for input(s): PROBNP in the last 8760 hours. HbA1C: No results for input(s): HGBA1C in the last 72 hours. CBG: No results for input(s): GLUCAP in the last 168 hours. Lipid Profile: No results for input(s): CHOL, HDL, LDLCALC, TRIG, CHOLHDL, LDLDIRECT in the last 72 hours. Thyroid  Function Tests: No results for input(s): TSH, T4TOTAL, FREET4, T3FREE, THYROIDAB in the last 72 hours. Anemia Panel: No results for input(s): VITAMINB12, FOLATE, FERRITIN, TIBC, IRON , RETICCTPCT in the last 72 hours. Sepsis Labs: No results for input(s): PROCALCITON, LATICACIDVEN in the last 168 hours.  Recent Results (from the past 240 hours)  Surgical pcr screen     Status: None   Collection Time: 10/10/24  9:48 PM   Specimen: Nasal Mucosa; Nasal Swab  Result Value Ref Range Status   MRSA, PCR NEGATIVE NEGATIVE Final   Staphylococcus aureus NEGATIVE NEGATIVE Final    Comment: (NOTE) The Xpert SA Assay (FDA approved for NASAL specimens in patients 74 years of age and older), is one component of a  comprehensive surveillance program. It is not intended to diagnose infection nor to guide or monitor treatment. Performed at [facility], [address] [place], [place] [address]          Radiology Studies: No results found.      Scheduled Meds:  acetaminophen   1,000 mg Oral Q6H   amoxicillin-clavulanate  1 tablet Oral Q12H   finasteride   5 mg Oral QPM   midodrine   2.5 mg Oral TID WC   pantoprazole   40 mg Oral Daily   polyethylene glycol  17 g Oral Daily   sodium chloride  flush  3 mL Intravenous Q12H   Continuous Infusions:  heparin  1,050 Units/hr (10/18/24 1035)      LOS: 10 days    Time spent: 45 minutes spent on chart review, discussion with nursing staff, consultants, updating family and interview/physical exam; more than 50% of that time was spent  in counseling and/or coordination of care.    Harlene RAYMOND Bowl, DO [facility] Available via Epic secure chat 7am-7pm After these hours, please refer to coverage provider listed on [email or web address] 10/18/2024, 10:55 AM

## 2024-10-18 NOTE — Plan of Care (Signed)
  Problem: Education: Goal: Knowledge of the procedure and recovery process will improve Outcome: Progressing   Problem: Pain Management: Goal: General experience of comfort will improve Outcome: Progressing   Problem: Skin Integrity: Goal: Demonstration of wound healing without infection will improve Outcome: Progressing   Problem: Urinary Elimination: Goal: Ability to avoid or minimize complications of infection will improve Outcome: Progressing Goal: Ability to achieve and maintain urine output will improve Outcome: Progressing   Problem: Education: Goal: Knowledge of General Education information will improve Description: Including pain rating scale, medication(s)/side effects and non-pharmacologic comfort measures Outcome: Progressing

## 2024-10-18 NOTE — Progress Notes (Signed)
 PHARMACY - ANTICOAGULATION CONSULT NOTE  Pharmacy Consult for heparin  Indication: atrial fibrillation  Allergies  Allergen Reactions   Tetanus Toxoid-Containing Vaccines Other (See Comments)    Childhood Allergy    Zetia  [Ezetimibe ] Diarrhea    Patient Measurements: Height: 5' 3 (160 cm) Weight: 65.8 kg (145 lb 1 oz) IBW/kg (Calculated) : 56.9 HEPARIN  DW (KG): 65.8  Vital Signs: Temp: 97.6 F (36.4 C) (10/31 2011) Temp Source: Oral (10/31 2011) BP: 113/68 (10/31 2011) Pulse Rate: 78 (10/31 2011)  Labs: Recent Labs    10/16/24 0907 10/16/24 1155 10/16/24 1727 10/17/24 0409 10/17/24 0959 10/17/24 1500 10/18/24 0310 10/18/24 1519 10/18/24 2003  HGB 8.0* 8.1*  --  6.8*   < > 7.6* 8.8* 8.6*  --   HCT 25.8* 25.2*  --  21.0*   < > 23.5* 27.0* 27.4*  --   PLT 180 173  --  150  --   --  152  --   --   HEPARINUNFRC  --   --    < > 0.59  --   --  <0.10*  --  0.49  CREATININE 0.94  --   --  0.97  --   --  1.02  --   --    < > = values in this interval not displayed.    Estimated Creatinine Clearance: 49.6 mL/min (by C-G formula based on SCr of 1.02 mg/dL).  Assessment: Patient is a 78 yo male admitted for choledocholithiasis. Patient also with history of atrial fibrillation taking apixaban  PTA. Patient reports last dose of apixaban  was 10/21 AM. Surgery consulted; s/p procedure - resuming heparin  now s/p HgB recovery.  10/31: Heparin  level at high end of goal range, will reduce dose to try and prevent supratherapeutic next level.  Goal of Therapy:  Heparin  level 0.3-0.5 units/ml Monitor platelets by anticoagulation protocol: Yes   Plan:  Reduce Heparin  to 950 units / hr Heparin  level with AM labs Daily heparin  level and CBC Follow up restart of Eliquis .   Larraine Brazier, PharmD Clinical Pharmacist 10/18/2024  8:51 PM **Pharmacist phone directory can now be found on [email or web address] ([email or web address]).  Listed under [facility].

## 2024-10-19 DIAGNOSIS — K8020 Calculus of gallbladder without cholecystitis without obstruction: Secondary | ICD-10-CM | POA: Diagnosis not present

## 2024-10-19 LAB — CBC
HCT: 27 % — ABNORMAL LOW (ref 39.0–52.0)
Hemoglobin: 8.7 g/dL — ABNORMAL LOW (ref 13.0–17.0)
MCH: 27.2 pg (ref 26.0–34.0)
MCHC: 32.2 g/dL (ref 30.0–36.0)
MCV: 84.4 fL (ref 80.0–100.0)
Platelets: 183 K/uL (ref 150–400)
RBC: 3.2 MIL/uL — ABNORMAL LOW (ref 4.22–5.81)
RDW: 17.8 % — ABNORMAL HIGH (ref 11.5–15.5)
WBC: 8.3 K/uL (ref 4.0–10.5)
nRBC: 0 % (ref 0.0–0.2)

## 2024-10-19 LAB — HEPATIC FUNCTION PANEL
ALT: 23 U/L (ref 0–44)
AST: 13 U/L — ABNORMAL LOW (ref 15–41)
Albumin: 2.3 g/dL — ABNORMAL LOW (ref 3.5–5.0)
Alkaline Phosphatase: 128 U/L — ABNORMAL HIGH (ref 38–126)
Bilirubin, Direct: 0.1 mg/dL (ref 0.0–0.2)
Indirect Bilirubin: 0.4 mg/dL (ref 0.3–0.9)
Total Bilirubin: 0.5 mg/dL (ref 0.0–1.2)
Total Protein: 5.3 g/dL — ABNORMAL LOW (ref 6.5–8.1)

## 2024-10-19 LAB — BASIC METABOLIC PANEL WITH GFR
Anion gap: 10 (ref 5–15)
BUN: 26 mg/dL — ABNORMAL HIGH (ref 8–23)
CO2: 24 mmol/L (ref 22–32)
Calcium: 8.4 mg/dL — ABNORMAL LOW (ref 8.9–10.3)
Chloride: 105 mmol/L (ref 98–111)
Creatinine, Ser: 0.92 mg/dL (ref 0.61–1.24)
GFR, Estimated: >60 mL/min (ref >60)
Glucose, Bld: 86 mg/dL (ref 70–99)
Potassium: 4 mmol/L (ref 3.5–5.1)
Sodium: 139 mmol/L (ref 135–145)

## 2024-10-19 LAB — HEPARIN LEVEL (UNFRACTIONATED): Heparin Unfractionated: 0.55 [IU]/mL (ref 0.30–0.70)

## 2024-10-19 MED ORDER — APIXABAN 5 MG PO TABS
5.0000 mg | ORAL_TABLET | Freq: Two times a day (BID) | ORAL | Status: DC
  Administered 2024-10-19 – 2024-10-20 (×3): 5 mg via ORAL
  Filled 2024-10-19 (×2): qty 1
  Filled 2024-10-19: qty 2

## 2024-10-19 MED ORDER — MIDODRINE HCL 5 MG PO TABS
5.0000 mg | ORAL_TABLET | Freq: Three times a day (TID) | ORAL | Status: DC
  Administered 2024-10-19 – 2024-10-20 (×2): 5 mg via ORAL
  Filled 2024-10-19 (×2): qty 1

## 2024-10-19 NOTE — Evaluation (Signed)
 Physical Therapy Evaluation Patient Details Name: Kyle Matthews MRN: [id number] DOB: [birth year] Today's Date: 10/19/2024  History of Present Illness  78 year old male admitted 10/21 Symptomatic cholelithiasis; complicated by choledocholithiasis. Status post laparoscopic fenestrating subtotal cholecystectomy 10/14/2024. PMH: hypertension, hyperlipidemia, CAD status post CABG, bioprosthetic AVR, native tricuspid valve endocarditis s/p TVR, atrial fibrillation and flutter on Eliquis , PPM, history of CVA, COPD, and symptomatic cholelithiasis.   Clinical Impression  Pt admitted with above diagnosis. Evaluated by PT post-op. Overall, mobilizing pretty well. Feels a little weaker than baseline and is agreeable to use SPC at d/c. DGI indicates elevated fall risk but SPC should provide adequate support to reduce risk significantly. Denies dizziness, VSS, ambulates 100 feet at a supervision level, tolerating dynamic gait challenges without overt LOB. May benefit from OPPT follow-up and discussed recs with pt. Verbalized understanding. Will follow acutely until d/c. Pt currently with functional limitations due to the deficits listed below (see PT Problem List). Pt will benefit from acute skilled PT to increase their independence and safety with mobility to allow discharge.           If plan is discharge home, recommend the following: A little help with bathing/dressing/bathroom;Assistance with cooking/housework;Help with stairs or ramp for entrance;Assist for transportation   Can travel by private vehicle        Equipment Recommendations None recommended by PT  Recommendations for Other Services       Functional Status Assessment Patient has had a recent decline in their functional status and demonstrates the ability to make significant improvements in function in a reasonable and predictable amount of time.     Precautions / Restrictions Precautions Precautions: Fall Recall of  Precautions/Restrictions: Intact Restrictions Weight Bearing Restrictions Per Provider Order: No      Mobility  Bed Mobility Overal bed mobility: Modified Independent             General bed mobility comments: extra time no assist    Transfers Overall transfer level: Needs assistance Equipment used: None Transfers: Sit to/from Stand Sit to Stand: Supervision           General transfer comment: Supervision for safety, mild sway able to stabilize without assistance, declines RW use.    Ambulation/Gait Ambulation/Gait assistance: Supervision Gait Distance (Feet): 100 Feet Assistive device: None Gait Pattern/deviations: Step-through pattern, Decreased stride length, Trunk flexed, Drifts right/left Gait velocity: dec Gait velocity interpretation: <1.8 ft/sec, indicate of risk for recurrent falls   General Gait Details: Minor instability, declines to use Rw at this time, states he will use SPC at d/c and agree this will be adequate to improve gait symmetry, stability, and safety. Tolerated dynamic challenges without overt LOB.  Stairs Stairs:  (Declines)          Wheelchair Mobility     Tilt Bed    Modified Rankin (Stroke Patients Only)       Balance Overall balance assessment: Mild deficits observed, not formally tested                               Standardized Balance Assessment Standardized Balance Assessment : Dynamic Gait Index   Dynamic Gait Index Level Surface: Normal Change in Gait Speed: Normal Gait with Horizontal Head Turns: Mild Impairment Gait with Vertical Head Turns: Mild Impairment Gait and Pivot Turn: Normal Step Over Obstacle: Mild Impairment Step Around Obstacles: Mild Impairment Steps: Mild Impairment Total Score: 19       Pertinent Vitals/Pain  Pain Assessment Pain Assessment: Faces Faces Pain Scale: Hurts little more Pain Location: abdomen Pain Descriptors / Indicators: Operative site guarding Pain  Intervention(s): Limited activity within patient's tolerance, Monitored during session, Repositioned    Home Living Family/patient expects to be discharged to:: Private residence Living Arrangements: Spouse/significant other Available Help at Discharge: Family;Available 24 hours/day Type of Home: House Home Access: Stairs to enter Entrance Stairs-Rails: Doctor, General Practice of Steps: 1+2+1 step up from drive way to walk way then 2 steps up from drive way to landing   Home Layout: Able to live on main level with bedroom/bathroom;Two level Home Equipment: Agricultural Consultant (2 wheels);Cane - single point;Shower seat      Prior Function Prior Level of Function : Independent/Modified Independent;Driving             Mobility Comments: ind ADLs Comments: ind     Extremity/Trunk Assessment   Upper Extremity Assessment Upper Extremity Assessment: Defer to OT evaluation    Lower Extremity Assessment Lower Extremity Assessment: Generalized weakness       Communication   Communication Communication: No apparent difficulties    Cognition Arousal: Alert Behavior During Therapy: WFL for tasks assessed/performed   PT - Cognitive impairments: No apparent impairments                         Following commands: Intact       Cueing Cueing Techniques: Verbal cues     General Comments General comments (skin integrity, edema, etc.): VSS    Exercises     Assessment/Plan    PT Assessment Patient needs continued PT services  PT Problem List Decreased strength;Decreased activity tolerance;Decreased balance;Decreased mobility;Pain       PT Treatment Interventions DME instruction;Gait training;Stair training;Therapeutic activities;Functional mobility training;Therapeutic exercise;Neuromuscular re-education;Balance training;Patient/family education    PT Goals (Current goals can be found in the Care Plan section)  Acute Rehab PT Goals Patient Stated Goal:  Return home PT Goal Formulation: With patient Time For Goal Achievement: 11/02/24 Potential to Achieve Goals: Good    Frequency Min 2X/week     Co-evaluation               AM-PAC PT 6 Clicks Mobility  Outcome Measure Help needed turning from your back to your side while in a flat bed without using bedrails?: None Help needed moving from lying on your back to sitting on the side of a flat bed without using bedrails?: None Help needed moving to and from a bed to a chair (including a wheelchair)?: A Little Help needed standing up from a chair using your arms (e.g., wheelchair or bedside chair)?: A Little Help needed to walk in hospital room?: A Little Help needed climbing 3-5 steps with a railing? : A Little 6 Click Score: 20    End of Session Equipment Utilized During Treatment: Gait belt Activity Tolerance: Patient tolerated treatment well Patient left: in bed;with call bell/phone within reach;with bed alarm set   PT Visit Diagnosis: Unsteadiness on feet (R26.81);Other abnormalities of gait and mobility (R26.89);Muscle weakness (generalized) (M62.81);Difficulty in walking, not elsewhere classified (R26.2);Pain Pain - part of body:  (abdomen)    Time: 8588-8567 PT Time Calculation (min) (ACUTE ONLY): 21 min   Charges:   PT Evaluation $PT Eval Low Complexity: 1 Low   PT General Charges $$ ACUTE PT VISIT: 1 Visit         Leontine Roads, PT, DPT [facility]  Rehabilitation Services Physical Therapist Office: [phone] Website:  [email or web address]   Leontine GORMAN Roads 10/19/2024, 3:33 PM

## 2024-10-19 NOTE — Progress Notes (Signed)
 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 Patient was admitted with recurrent upper abdominal pain, nausea, and vomiting. Input from the GI and surgery team is highly appreciated.  Patient went to the OR with a laparoscopic fenestrating subtotal colectomy on 10/27  Assessment and Plan: Symptomatic cholelithiasis; complicated by choledocholithiasis- MRCP results: Cholelithiasis and partially obstructive choledocholithiasis and sludge, similar to prior. - GI following.  Post ERCP on 10/14/2024. - Finished Augmentin -resume Eliquis  -Status post laparoscopic fenestrating subtotal cholecystectomy by Dr. Ann on 10/14/2024. - Tolerating diet   Anemia with hypotension - No source found, status post 2 units total with improvement of blood pressure and hemoglobin Continue to trend hemoglobin  AKI: - Resolved   Avoid nephrotoxic agents, dehydration, and hypotension   Atrial fibrillation, flutter  - S/p DCCV 09/13/24  - Continue to hold Eliquis , holding heparin  until hemoglobin greater than 8 - continue to hold amio. Rate controlled -Resume Eliquis    Pancreatic cysts: - Incidental finding on MRCP which are stable from previous imaging.  -Outpatient follow-up   CAD  No anginal symptoms. Continue to monitor on telemetry.   Chronic HFpEF  - EF was normal on echo in August 2025    - Appears compensated  Euvolemic on exam.   Hypertension- BP on lower end of normal so we will hold medications Continue to closely monitor vital signs.   Prolonged QT interval  Continue to optimize magnesium  and potassium  level.   Patient to ambulate and possibly home in the a.m.   DVT prophylaxis: Heparin  drip apixaban  (ELIQUIS ) tablet 5 mg    Code Status: Full Code   Disposition Plan:  Level of care: Telemetry Status is: Inpatient     Consultants:  General surgery GI    Subjective: No current complaints  Objective: Vitals:   10/18/24 2011 10/18/24 2349 10/19/24 0633 10/19/24 0853  BP: 113/68 117/75 102/70 109/71  Pulse: 78 73  77  Resp: 18 14 13 16   Temp: 97.6 F (36.4 C) 97.7 F (36.5 C) 97.8 F (36.6 C) 98 F (36.7 C)  TempSrc: Oral Oral Oral Oral  SpO2: 100% 99% 99% 100%  Weight:      Height:        Intake/Output Summary (Last 24 hours) at 10/19/2024 1237 Last data filed at 10/19/2024 0701 Gross per 24 hour  Intake 289.01 ml  Output 345 ml  Net -55.99 ml   Filed Weights   10/08/24 1742 10/11/24 0725 10/14/24 0704  Weight: 65.7 kg 65.8 kg 65.8 kg    Examination:   General: Appearance:     Overweight male in no acute distress     Lungs:     respirations unlabored  Heart:    Normal heart rate.    MS:   All extremities are intact.    Neurologic:   Awake, alert       Data Reviewed: I have personally reviewed following labs and imaging studies  CBC: Recent Labs  Lab 10/16/24 0907 10/16/24 1155 10/17/24 0409 10/17/24 0959 10/17/24 1500 10/18/24 0310 10/18/24 1519 10/19/24 0254  WBC 12.9* 12.6* 12.4*  --   --  9.8  --  8.3  HGB 8.0* 8.1* 6.8*  7.1* 7.6* 8.8* 8.6* 8.7*  HCT 25.8* 25.2* 21.0* 22.5* 23.5* 27.0* 27.4* 27.0*  MCV 83.2 81.8 81.1  --   --  83.9  --  84.4  PLT 180 173 150  --   --  152  --  183   Basic Metabolic Panel: Recent Labs  Lab 10/15/24 0553 10/16/24 0907 10/17/24 0409 10/18/24 0310 10/19/24 0254  NA 134* 134* 136 138 139  K 4.3 4.3 3.9 4.2 4.0  CL 101 101 103 105 105  CO2 23 21* 23 22 24   GLUCOSE 122* 99 95 94 86  BUN 22 51* 53* 36* 26*  CREATININE 0.88 0.94 0.97 1.02 0.92  CALCIUM  8.7* 7.9* 8.2* 8.5* 8.4*    GFR: Estimated Creatinine Clearance: 55 mL/min (by C-G formula based on SCr of 0.92 mg/dL). Liver Function Tests: Recent Labs  Lab 10/13/24 0451 10/15/24 0553 10/16/24 1155 10/17/24 0409 10/19/24 0254  AST 71* 31 14* 11* 13*  ALT 148* 85* 43 31 23  ALKPHOS 399* 279* 151* 137* 128*  BILITOT 0.6 0.5 0.5 0.4 0.5  PROT 6.0* 6.4* 5.2* 4.9* 5.3*  ALBUMIN  2.6* 2.7* 2.2* 2.1* 2.3*   No results for input(s): LIPASE, AMYLASE in the last 168 hours. No results for input(s): AMMONIA in the last 168 hours. Coagulation Profile: No results for input(s): INR, PROTIME in the last 168 hours. Cardiac Enzymes: No results for input(s): CKTOTAL, CKMB, CKMBINDEX, TROPONINI in the last 168 hours. BNP (last 3 results) No results for input(s): PROBNP in the last 8760 hours. HbA1C: No results for input(s): HGBA1C in the last 72 hours. CBG: No results for input(s): GLUCAP in the last 168 hours. Lipid Profile: No results for input(s): CHOL, HDL, LDLCALC, TRIG, CHOLHDL, LDLDIRECT in the last 72 hours. Thyroid  Function Tests: No results for input(s): TSH, T4TOTAL, FREET4, T3FREE, THYROIDAB in the last 72 hours. Anemia Panel: No results for input(s): VITAMINB12, FOLATE, FERRITIN, TIBC, IRON , RETICCTPCT in the last 72 hours. Sepsis Labs: No results for input(s): PROCALCITON, LATICACIDVEN in the last 168 hours.  Recent Results (from the past 240 hours)  Surgical pcr screen     Status: None   Collection Time: 10/10/24  9:48 PM   Specimen: Nasal Mucosa; Nasal Swab  Result Value Ref Range Status   MRSA, PCR NEGATIVE NEGATIVE Final   Staphylococcus aureus NEGATIVE NEGATIVE Final    Comment: (NOTE) The Xpert SA Assay (FDA approved for NASAL specimens in patients 8 years of age and older), is one component of a comprehensive surveillance program. It is not intended to diagnose infection nor to guide or monitor treatment. Performed at [facility], [address], [place], [place] [address]          Radiology Studies: No results found.      Scheduled Meds:  acetaminophen   1,000 mg Oral Q6H   apixaban   5 mg Oral BID   finasteride   5 mg Oral QPM   midodrine   2.5 mg Oral TID WC   pantoprazole   40 mg Oral Daily   polyethylene glycol  17 g Oral Daily   sodium chloride  flush  3 mL Intravenous Q12H   Continuous Infusions:      LOS: 11 days    Time spent: 45 minutes spent on chart review, discussion with nursing staff, consultants, updating family and interview/physical exam; more than 50% of that time was spent in counseling and/or coordination of care.    Harlene RAYMOND Bowl, DO [facility] Available via Epic secure  chat 7am-7pm After these hours, please refer to coverage provider listed on [email or web address] 10/19/2024, 12:37 PM

## 2024-10-19 NOTE — Progress Notes (Addendum)
 PHARMACY - ANTICOAGULATION CONSULT NOTE  Pharmacy Consult for heparin  > apixaban  Indication: atrial fibrillation  Allergies  Allergen Reactions   Tetanus Toxoid-Containing Vaccines Other (See Comments)    Childhood Allergy    Zetia  [Ezetimibe ] Diarrhea    Patient Measurements: Height: 5' 3 (160 cm) Weight: 65.8 kg (145 lb 1 oz) IBW/kg (Calculated) : 56.9 HEPARIN  DW (KG): 65.8  Vital Signs: Temp: 97.8 F (36.6 C) (11/01 0633) Temp Source: Oral (11/01 0633) BP: 102/70 (11/01 9366) Pulse Rate: 73 (10/31 2349)  Labs: Recent Labs    10/17/24 0409 10/17/24 0959 10/18/24 0310 10/18/24 1519 10/18/24 2003 10/19/24 0254  HGB 6.8*   < > 8.8* 8.6*  --  8.7*  HCT 21.0*   < > 27.0* 27.4*  --  27.0*  PLT 150  --  152  --   --  183  HEPARINUNFRC 0.59  --  <0.10*  --  0.49 0.55  CREATININE 0.97  --  1.02  --   --  0.92   < > = values in this interval not displayed.    Estimated Creatinine Clearance: 55 mL/min (by C-G formula based on SCr of 0.92 mg/dL).  Assessment: Patient is a 78 yo male admitted for choledocholithiasis. Patient also with history of atrial fibrillation taking apixaban  PTA. Patient reports last dose of apixaban  was 10/21 AM. Surgery consulted; s/p procedure 10/27 - heparin  resumed s/p Hgb recovery.  11/1: Heparin  level supratherapeutic at 0.55. Will switch back to apixaban  today per surgery since patient is doing well. Hgb stable at 8.7, PLT stable at 183. No signs of bleeding noted.   Goal of Therapy:  Heparin  level 0.3-0.5 units/ml Monitor platelets by anticoagulation protocol: Yes   Plan:  Stop heparin  infusion Restart apixaban  5 mg PO BID Monitor Hgb, PLT, s/sx of bleeding  Izetta Carl, PharmD PGY1 Pharmacy Resident

## 2024-10-19 NOTE — Progress Notes (Signed)
 5 Days Post-Op   Subjective/Chief Complaint: Doing well  Pain controlled Tolerating a diet   Objective: Vital signs in last 24 hours: Temp:  [97.6 F (36.4 C)-98.1 F (36.7 C)] 98 F (36.7 C) (11/01 0853) Pulse Rate:  [73-88] 77 (11/01 0853) Resp:  [13-20] 16 (11/01 0853) BP: (102-123)/(67-75) 109/71 (11/01 0853) SpO2:  [95 %-100 %] 100 % (11/01 0853) Last BM Date : 10/16/24  Intake/Output from previous day: 10/31 0701 - 11/01 0700 In: 289 [P.O.:240; I.V.:49] Out: 355 [Urine:300; Drains:55] Intake/Output this shift: Total I/O In: -  Out: 300 [Urine:300]  Exam: Awake and alert Abdomen soft, minimally tender, drain bile tinged  Lab Results:  Recent Labs    10/18/24 0310 10/18/24 1519 10/19/24 0254  WBC 9.8  --  8.3  HGB 8.8* 8.6* 8.7*  HCT 27.0* 27.4* 27.0*  PLT 152  --  183   BMET Recent Labs    10/18/24 0310 10/19/24 0254  NA 138 139  K 4.2 4.0  CL 105 105  CO2 22 24  GLUCOSE 94 86  BUN 36* 26*  CREATININE 1.02 0.92  CALCIUM  8.5* 8.4*   PT/INR No results for input(s): LABPROT, INR in the last 72 hours. ABG No results for input(s): PHART, HCO3 in the last 72 hours.  Invalid input(s): PCO2, PO2  Studies/Results: No results found.  Anti-infectives: Anti-infectives (From admission, onward)    Start     Dose/Rate Route Frequency Ordered Stop   10/14/24 1400  amoxicillin-clavulanate (AUGMENTIN) 875-125 MG per tablet 1 tablet        1 tablet Oral Every 12 hours 10/14/24 1310 10/18/24 2106   10/14/24 0715  ceFAZolin  (ANCEF ) IVPB 2g/100 mL premix        2 g 200 mL/hr over 30 Minutes Intravenous On call to O.R. 10/14/24 0712 10/14/24 0825   10/11/24 0600  ceFAZolin  (ANCEF ) IVPB 2g/100 mL premix  Status:  Discontinued        2 g 200 mL/hr over 30 Minutes Intravenous On call to O.R. 10/10/24 1125 10/11/24 0946   10/08/24 2200  piperacillin -tazobactam (ZOSYN ) IVPB 3.375 g  Status:  Discontinued        3.375 g 12.5 mL/hr over 240 Minutes  Intravenous Every 8 hours 10/08/24 2050 10/12/24 1128       Assessment/Plan: POD 5 s/p laparoscopic fenestrating subtotal cholecystectomy by Dr. Ann on 10/14/24 - S/p ERCP with biliary sphincterotomy and balloon sweeping 10/24 by Dr. Wilhelmenia for Choledocholithiasis   -ok to stop antibiotics -drain output continuing to decrease with 50 cc out last 24 hrs -Hgb stable  -ok to resume Eliquis  -hopefully home with drain in the next 1 to 2 days  Vicenta Poli MD 10/19/2024

## 2024-10-20 ENCOUNTER — Other Ambulatory Visit (HOSPITAL_COMMUNITY): Payer: Self-pay

## 2024-10-20 DIAGNOSIS — K8020 Calculus of gallbladder without cholecystitis without obstruction: Secondary | ICD-10-CM | POA: Diagnosis not present

## 2024-10-20 MED ORDER — MIDODRINE HCL 5 MG PO TABS: 5.0000 mg | ORAL_TABLET | Freq: Three times a day (TID) | ORAL | 0 refills | Status: AC

## 2024-10-20 MED ORDER — ROSUVASTATIN CALCIUM 40 MG PO TABS
40.0000 mg | ORAL_TABLET | Freq: Every day | ORAL | 3 refills | Status: AC
  Filled 2024-10-20: qty 30, 30d supply, fill #0

## 2024-10-20 MED ORDER — POLYETHYLENE GLYCOL 3350 17 G PO PACK: 17.0000 g | PACK | Freq: Every day | ORAL | Status: AC | PRN

## 2024-10-20 MED ORDER — ROSUVASTATIN CALCIUM 10 MG PO TABS: 40.0000 mg | ORAL_TABLET | Freq: Every day | ORAL | Status: DC

## 2024-10-20 NOTE — Discharge Instructions (Signed)
 [facility] ______[facility] LAPAROSCOPIC SURGERY: POST OP INSTRUCTIONS Always review your discharge instruction sheet given to you by the facility where your surgery was performed. IF YOU HAVE DISABILITY OR FAMILY LEAVE FORMS, YOU MUST BRING THEM TO THE OFFICE FOR PROCESSING.   DO NOT GIVE THEM TO YOUR DOCTOR.  A prescription for pain medication may be given to you upon discharge.  Take your pain medication as prescribed, if needed.  If narcotic pain medicine is not needed, then you may take acetaminophen  (Tylenol ) or ibuprofen (Advil) as needed. Take your usually prescribed medications unless otherwise directed. If you need a refill on your pain medication, please contact your pharmacy.  They will contact our office to request authorization. Prescriptions will not be filled after 5pm or on week-ends. You should follow a light diet the first few days after arrival home, such as soup and crackers, etc.  Be sure to include lots of fluids daily. Most patients will experience some swelling and bruising in the area of the incisions.  Ice packs will help.  Swelling and bruising can take several days to resolve.  It is common to experience some constipation if taking pain medication after surgery.  Increasing fluid intake and taking a stool softener (such as Colace) will usually help or prevent this problem from occurring.  A mild laxative (Milk of Magnesia or Miralax ) should be taken according to package instructions if there are no bowel movements after 48 hours. Unless discharge instructions indicate otherwise, you may remove your bandages 24-48 hours after surgery, and you may shower at that time.  You may have steri-strips (small skin tapes) in place directly over the incision.  These strips should be left on the skin for 7-10 days.  If your surgeon used skin glue on the incision, you may shower in 24 hours.  The glue will flake off over the next 2-3 weeks.  Any sutures or staples will be  removed at the office during your follow-up visit. ACTIVITIES:  You may resume regular (light) daily activities beginning the next day--such as daily self-care, walking, climbing stairs--gradually increasing activities as tolerated.  You may have sexual intercourse when it is comfortable.  Refrain from any heavy lifting or straining until approved by your doctor. You may drive when you are no longer taking prescription pain medication, you can comfortably wear a seatbelt, and you can safely maneuver your car and apply brakes. RETURN TO WORK:  __________________________________________________________ Rosine should see your doctor in the office for a follow-up appointment approximately 2-3 weeks after your surgery.  Make sure that you call for this appointment within a day or two after you arrive home to insure a convenient appointment time. OTHER INSTRUCTIONS: YOU MAY SHOWER WITH THE DRAIN RECORD THE DRAIN OUTPUT AT LEAST 1 TO 2 TIMES A DAY NO LIFTING MORE THAN 15 POUNDS FOR 2 WEEKS __________________________________________________________________________________________________________________________ __________________________________________________________________________________________________________________________ WHEN TO CALL YOUR DOCTOR: Fever over 101.0 Inability to urinate Continued bleeding from incision. Increased pain, redness, or drainage from the incision. Increasing abdominal pain  The clinic staff is available to answer your questions during regular business hours.  Please don't hesitate to call and ask to speak to one of the nurses for clinical concerns.  If you have a medical emergency, go to the nearest emergency room or call 911.  A surgeon from [facility] is always on call at the hospital.       [facility] Bring this sheet to all of your post-operative appointments while  you have your drains. Please measure your drains by CC's or ML's. Make sure you  drain and measure your JP Drains 3 times per day. At the end of each day, add up totals for the left side and add up totals for the right side.    ( 9 am )     ( 3 pm )        ( 9 pm )                Date L  R  L  R  L  R  Total L/R                                                                                                                                                                                          [address], [place], [place]  [address] ? [address] [address], [place], [place]   [address] [phone] ? [phone] ? FAX [phone] Web site: [email or web address]

## 2024-10-20 NOTE — Discharge Summary (Signed)
 Physician Discharge Summary  Falon Flinchum FMW:[id number] DOB: [birth year] DOA: 10/08/2024  PCP: [name], DO  Admit date: 10/08/2024 Discharge date: 10/20/2024  Admitted From:  Discharge disposition: home   Recommendations for Outpatient Follow-Up:   Drain removal per general surgery Blood pressure meds on hold for now due to hypotension-resume as needed CBC/CMP 1 week   Discharge Diagnosis:   Principal Problem:   Symptomatic cholelithiasis Active Problems:   Essential hypertension   Chronic heart failure with preserved ejection fraction (HFpEF) (HCC)   Atrial fibrillation and flutter (HCC)   Coronary artery disease involving native coronary artery of native heart with unstable angina pectoris (HCC)   History of stroke   Choledocholithiasis   Elevated LFTs   AKI (acute kidney injury)   Prolonged QT interval   Erosive gastritis   Dilated bile duct    Discharge Condition: Improved.  Diet recommendation: Regular  Wound care: Removal by general surgery  Code status: Full.   History of Present Illness:    Elwin Tsou is a 78 y.o. male with medical history significant for hypertension, hyperlipidemia, CAD status post CABG, bioprosthetic AVR, native tricuspid valve endocarditis s/p TVR, atrial fibrillation and flutter on Eliquis , PPM, history of CVA, COPD, and symptomatic cholelithiasis who presents with recurrent upper abdominal pain, nausea, and vomiting.   Patient has been admitted multiple times since early September for symptomatic cholelithiasis.  Imaging during the last admission was concerning for possible cholecystitis with perforated gallbladder.  He was started on Zosyn  and evaluated by surgery, IR, and ID.  He had DCCV on 09/13/2024 and cardiology recommended uninterrupted anticoagulation for at least 4 weeks following this.  Drain was not placed by IR due to contracted gallbladder.  He was treated with Zosyn  and discharged on 10/03/2024 on  Augmentin.     He had been doing well until this afternoon when he developed severe upper abdominal pain with nausea and vomiting.  With persistent symptoms, he came back into the ED. He denies any fevers or chills.     ED Course: Upon arrival to the ED, patient is found to be afebrile and saturating well on room air with normal HR and stable BP.  Labs are most notable for creatinine 1.30, alkaline phosphatase 421, AST 414, ALT 188, total bilirubin 2.1, and normal WBC.  Ultrasound demonstrates dilated gallbladder to 9.5 mm with suspected small stones and sludge but no evidence for acute cholecystitis.   Surgery and GI were consulted by the ED physician, 1 L LR was administered, and the patient was started on Zosyn .     Hospital Course by Problem:   POD 6 s/p laparoscopic fenestrating subtotal cholecystectomy by Dr. Ann on 10/14/24 - S/p ERCP with biliary sphincterotomy and balloon sweeping 10/24 by Dr. Wilhelmenia for Choledocholithiasis  -he is doing well and it is ok for discharge from a surgical standpoint.  Follow up being arranged for this week as the drain can likely be removed in the office soon  Finished Augmentin -resume Eliquis  -Status post laparoscopic fenestrating subtotal cholecystectomy by Dr. Ann on 10/14/2024. - Tolerating diet  Anemia with hypotension - No source found, status post 2 units total with improvement of blood pressure and hemoglobin Continue to trend hemoglobin   AKI: - Resolved   Avoid nephrotoxic agents, dehydration, and hypotension   Atrial fibrillation, flutter  - S/p DCCV 09/13/24  - Continue to hold Eliquis , holding heparin  until hemoglobin greater than 8 - continue to hold amio. Rate controlled -Resume  Eliquis    Pancreatic cysts: - Incidental finding on MRCP which are stable from previous imaging.  -Outpatient follow-up   CAD  No anginal symptoms. Continue to monitor on telemetry.   Chronic HFpEF  - EF was normal on echo in August  2025    - Appears compensated  Euvolemic on exam.   Hypertension- BP on lower end of normal so we will hold medications Continue to closely monitor vital signs.   Prolonged QT interval  -Corrected when right bundle branch block is taken to account -Close follow-up with cardiology   Medical Consultants:   General Surgery GI   Discharge Exam:   Vitals:   10/20/24 0721 10/20/24 0950  BP: (!) 134/49   Pulse:  90  Resp: 16   Temp: 98.4 F (36.9 C)   SpO2: 99%    Vitals:   10/19/24 2109 10/20/24 0013 10/20/24 0721 10/20/24 0950  BP: 99/72 115/68 (!) 134/49   Pulse: 70 77  90  Resp: 20 20 16    Temp: 98.1 F (36.7 C) 98.2 F (36.8 C) 98.4 F (36.9 C)   TempSrc: Oral Oral Oral   SpO2:  97% 99%   Weight:      Height:        General exam: Appears calm and comfortable.    The results of significant diagnostics from this hospitalization (including imaging, microbiology, ancillary and laboratory) are listed below for reference.     Procedures and Diagnostic Studies:   MR ABDOMEN MRCP W WO CONTAST Result Date: 10/09/2024 CLINICAL DATA:  Jaundice and cholelithiasis EXAM: MRI ABDOMEN WITHOUT AND WITH CONTRAST (INCLUDING MRCP) TECHNIQUE: Multiplanar multisequence MR imaging of the abdomen was performed both before and after the administration of intravenous contrast. Heavily T2-weighted images of the biliary and pancreatic ducts were obtained, and three-dimensional MRCP images were rendered by post processing. CONTRAST:  7mL GADAVIST GADOBUTROL 1 MMOL/ML IV SOLN COMPARISON:  Ultrasound abdomen October 08, 2024. CT abdomen pelvis September 02, 2024. FINDINGS: Lower chest: No acute abnormality.  Median sternotomy. Hepatobiliary/MRCP/pancreas: Scattered T2 hyperintense liver cysts measuring up to 2.5 cm. Gallbladder contains multiple calculi. Normal wall thickness. Common bile duct is dilated up to 8.5 mm and contains multiple intraluminal filling defects in distal CBD consistent with  choledocholithiasis/sludge. No significant intrahepatic biliary dilatation. Bifid configuration of the pancreatic duct with dominant duct of [place] drainage. 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 Additional cystic lesion measuring 1 cm is identified along the pancreatic head abutting the second portion of duodenum which may represent an exophytic pancreatic cyst. Differentials include duodenal diverticulum (7/54). No abnormal enhancement is identified associated with these lesions. Pancreatic [place] duct measures 4 mm at the pancreatic head. Pancreatic body and tail duct measures 3 mm. No enhancing pancreatic lesion identified. Spleen: Normal in size without focal abnormality.  Small splenule. Adrenals/Urinary Tract: Adrenal glands are unremarkable. Bilateral simple and complex renal cortical cysts measuring up to 6.3 cm. Some of the cysts demonstrate T1 hyperintense components and hematocrit level suggestive of proteinaceous/hemorrhagic products. No suspicious enhancement or solid component. (Bosniak 2). No hydronephrosis. Stomach/Bowel: Stomach is within normal limits. No evidence of bowel wall thickening, distention, or inflammatory changes. Vascular/Lymphatic: No significant vascular findings are present. No enlarged abdominal or pelvic lymph nodes. Other: No abdominal wall hernia or abnormality. No ascites. Musculoskeletal: No acute or significant osseous findings. IMPRESSION: Cholelithiasis and partially obstructive choledocholithiasis and sludge, similar to prior. 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 Follow-up according to guidelines below. Bilateral simple and complex renal cortical cysts which does not require imaging follow-up, stable. (Bosniak 2) Scattered liver cysts, stable to prior. [place] consensus guideline for intraductal papillary mucinous  neoplasms (IPMNs): Optimal imaging surveillance strategies for suspected BD-IPMNs <3 cm and without worrisome features is unclear, but the yearly incidence of transformation to pancreatic cancer is estimated at 0.4-1.1% per year: -Largest cyst less than 1 cm: CT or MRI/MRCP in 2-3 years -Largest cyst 1-2 cm: CT or MRI/MRCP annually for 2 years, then lengthen interval if no change -Largest cyst 2-3 cm: EUS in 6 months, then lengthen interval alternating MRI with EUS as appropriate consider surgery in young patients American Gastroenterological Association recommends stopping surveillance after 5 years if no significant change is observed or if a cyst is resected and found to be benign This is not a recommendation that is explicitly stated in the [place] 2017 update 3. Electronically Signed   By: Megan  Zare M.D.   On: 10/09/2024 14:31   MR 3D Recon At Scanner Result Date: 10/09/2024 CLINICAL DATA:  Jaundice and cholelithiasis EXAM: MRI ABDOMEN WITHOUT AND WITH CONTRAST (INCLUDING MRCP) TECHNIQUE: Multiplanar multisequence MR imaging of the abdomen was performed both before and after the administration of intravenous contrast. Heavily T2-weighted images of the biliary and pancreatic ducts were obtained, and three-dimensional MRCP images were rendered by post processing. CONTRAST:  7mL GADAVIST GADOBUTROL 1 MMOL/ML IV SOLN COMPARISON:  Ultrasound abdomen October 08, 2024. CT abdomen pelvis September 02, 2024. FINDINGS: Lower chest: No acute abnormality.  Median sternotomy. Hepatobiliary/MRCP/pancreas: Scattered T2 hyperintense liver cysts measuring up to 2.5 cm. Gallbladder contains multiple calculi. Normal wall thickness. Common bile duct is dilated up to 8.5 mm and contains multiple intraluminal filling defects in distal CBD consistent with choledocholithiasis/sludge. No significant intrahepatic biliary dilatation. Bifid configuration of the pancreatic duct with dominant duct of [place] drainage. 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 Additional cystic lesion measuring 1 cm is identified along the pancreatic head abutting the second portion of duodenum which may represent an exophytic pancreatic cyst. Differentials include duodenal diverticulum (7/54). No abnormal enhancement is identified associated with these lesions. Pancreatic [place] duct measures 4 mm at the pancreatic head. Pancreatic body and tail duct measures 3 mm. No enhancing pancreatic lesion identified. Spleen: Normal in size without focal abnormality.  Small splenule. Adrenals/Urinary Tract: Adrenal glands are unremarkable. Bilateral simple and complex renal cortical cysts measuring up to 6.3 cm. Some of the cysts demonstrate T1 hyperintense components and hematocrit level suggestive of proteinaceous/hemorrhagic products. No suspicious enhancement or solid component. (Bosniak 2). No hydronephrosis. Stomach/Bowel: Stomach is within normal limits. No evidence of bowel wall thickening, distention, or inflammatory changes. Vascular/Lymphatic: No significant vascular findings are present. No enlarged abdominal or pelvic lymph nodes. Other: No abdominal wall hernia or abnormality. No ascites. Musculoskeletal: No acute or significant osseous findings. IMPRESSION: Cholelithiasis and partially obstructive choledocholithiasis and sludge, similar to prior. 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 Follow-up according to guidelines below. Bilateral simple and complex renal cortical cysts which does not require imaging follow-up, stable. (Bosniak 2) Scattered liver cysts, stable to prior. [place] consensus guideline for intraductal papillary mucinous neoplasms (IPMNs): Optimal imaging surveillance strategies for suspected BD-IPMNs <3 cm and without worrisome features is unclear, but the yearly incidence of  transformation to  pancreatic cancer is estimated at 0.4-1.1% per year: -Largest cyst less than 1 cm: CT or MRI/MRCP in 2-3 years -Largest cyst 1-2 cm: CT or MRI/MRCP annually for 2 years, then lengthen interval if no change -Largest cyst 2-3 cm: EUS in 6 months, then lengthen interval alternating MRI with EUS as appropriate consider surgery in young patients American Gastroenterological Association recommends stopping surveillance after 5 years if no significant change is observed or if a cyst is resected and found to be benign This is not a recommendation that is explicitly stated in the [place] 2017 update 3. Electronically Signed   By: Megan  Zare M.D.   On: 10/09/2024 14:31   US  Abdomen Limited RUQ (LIVER/GB) Result Date: 10/08/2024 CLINICAL DATA:  151471 RUQ pain 151471 EXAM: ULTRASOUND ABDOMEN LIMITED RIGHT UPPER QUADRANT COMPARISON:  10/02/2024 FINDINGS: Gallbladder: Multiple radiopaque gallstones. No wall thickening or pericholecystic fluid. No sonographic Murphy's sign noted by sonographer. Common bile duct: Diameter: 9.5 mm.  Multiple small stones and biliary sludge. Liver: Normal echogenicity. Septated cyst in the right hepatic lobe measuring 3.1 cm. No intrahepatic biliary ductal dilation. Portal vein is patent on color Doppler imaging with normal direction of blood flow towards the liver. Right Kidney: Partially visualized. 5.2 cm upper pole cyst. No hydronephrosis or nephrolithiasis. Other: None. IMPRESSION: 1. Dilation of the common bile duct with possible small stones and biliary sludge present. Correlation with serum bilirubin recommended. A follow-up multiphase abdominal MRI with IV contrast is recommended for further characterization. 2. Cholecystolithiasis.  No changes of acute cholecystitis. Electronically Signed   By: Rogelia Myers M.D.   On: 10/08/2024 18:41   DG Chest Portable 1 View Result Date: 10/08/2024 CLINICAL DATA:  Chest tightness, back pain, nausea and vomiting EXAM: PORTABLE CHEST 1 VIEW  COMPARISON:  09/30/2024 FINDINGS: Single frontal view of the chest demonstrates stable dual lead pacemaker and postsurgical changes from CABG and aortic valve replacement. The cardiac silhouette is stable. No acute airspace disease, effusion, or pneumothorax. No acute bony abnormalities. IMPRESSION: 1. Stable chest, no acute process. Electronically Signed   By: Ozell Daring M.D.   On: 10/08/2024 18:22     Labs:   Basic Metabolic Panel: Recent Labs  Lab 10/15/24 0553 10/16/24 0907 10/17/24 0409 10/18/24 0310 10/19/24 0254  NA 134* 134* 136 138 139  K 4.3 4.3 3.9 4.2 4.0  CL 101 101 103 105 105  CO2 23 21* 23 22 24   GLUCOSE 122* 99 95 94 86  BUN 22 51* 53* 36* 26*  CREATININE 0.88 0.94 0.97 1.02 0.92  CALCIUM  8.7* 7.9* 8.2* 8.5* 8.4*   GFR Estimated Creatinine Clearance: 55 mL/min (by C-G formula based on SCr of 0.92 mg/dL). Liver Function Tests: Recent Labs  Lab 10/15/24 0553 10/16/24 1155 10/17/24 0409 10/19/24 0254  AST 31 14* 11* 13*  ALT 85* 43 31 23  ALKPHOS 279* 151* 137* 128*  BILITOT 0.5 0.5 0.4 0.5  PROT 6.4* 5.2* 4.9* 5.3*  ALBUMIN  2.7* 2.2* 2.1* 2.3*   No results for input(s): LIPASE, AMYLASE in the last 168 hours. No results for input(s): AMMONIA in the last 168 hours. Coagulation profile No results for input(s): INR, PROTIME in the last 168 hours.  CBC: Recent Labs  Lab 10/16/24 0907 10/16/24 1155 10/17/24 0409 10/17/24 0959 10/17/24 1500 10/18/24 0310 10/18/24 1519 10/19/24 0254  WBC 12.9* 12.6* 12.4*  --   --  9.8  --  8.3  HGB 8.0* 8.1* 6.8* 7.1* 7.6* 8.8* 8.6* 8.7*  HCT 25.8* 25.2* 21.0* 22.5* 23.5* 27.0* 27.4* 27.0*  MCV 83.2 81.8 81.1  --   --  83.9  --  84.4  PLT 180 173 150  --   --  152  --  183   Cardiac Enzymes: No results for input(s): CKTOTAL, CKMB, CKMBINDEX, TROPONINI in the last 168 hours. BNP: Invalid input(s): POCBNP CBG: No results for input(s): GLUCAP in the last 168 hours. D-Dimer No results  for input(s): DDIMER in the last 72 hours. Hgb A1c No results for input(s): HGBA1C in the last 72 hours. Lipid Profile No results for input(s): CHOL, HDL, LDLCALC, TRIG, CHOLHDL, LDLDIRECT in the last 72 hours. Thyroid  function studies No results for input(s): TSH, T4TOTAL, T3FREE, THYROIDAB in the last 72 hours.  Invalid input(s): FREET3 Anemia work up No results for input(s): VITAMINB12, FOLATE, FERRITIN, TIBC, IRON , RETICCTPCT in the last 72 hours. Microbiology Recent Results (from the past 240 hours)  Surgical pcr screen     Status: None   Collection Time: 10/10/24  9:48 PM   Specimen: Nasal Mucosa; Nasal Swab  Result Value Ref Range Status   MRSA, PCR NEGATIVE NEGATIVE Final   Staphylococcus aureus NEGATIVE NEGATIVE Final    Comment: (NOTE) The Xpert SA Assay (FDA approved for NASAL specimens in patients 31 years of age and older), is one component of a comprehensive surveillance program. It is not intended to diagnose infection nor to guide or monitor treatment. Performed at [facility], [address], [place], [place] [address]      Discharge Instructions:   Discharge Instructions     Diet - low sodium heart healthy   Complete by: As directed    Discharge instructions   Complete by: As directed    Monitor BP at home-- PCP Follow up in 1 week to possibly restart your BP meds BMP/CBC 1 week   Discharge wound care:   Complete by: As directed    Record drain output per General surgery   Increase activity slowly   Complete by: As directed       Allergies as of 10/20/2024       Reactions   Tetanus Toxoid-containing Vaccines Other (See Comments)   Childhood Allergy    Zetia  [ezetimibe ] Diarrhea        Medication List     PAUSE taking these medications    furosemide  40 MG tablet Wait to take this until your doctor or other care provider tells you to start again. Commonly known as: LASIX  Take 1 tablet (40 mg  total) by mouth daily.   Potassium Chloride  ER 20 MEQ Tbcr Wait to take this until your doctor or other care provider tells you to start again. Take 1 tablet by mouth daily.       STOP taking these medications    amLODipine  5 MG tablet Commonly known as: NORVASC    amoxicillin-clavulanate 875-125 MG tablet Commonly known as: AUGMENTIN       TAKE these medications    acetaminophen  325 MG tablet Commonly known as: TYLENOL  Take 2 tablets (650 mg total) by mouth every 6 (six) hours as needed for mild pain (pain score 1-3) or fever (or Fever >/= 101).   amiodarone  200 MG tablet Commonly known as: PACERONE  Take 1 tablet (200 mg total) by mouth daily.   apixaban  5 MG Tabs tablet Commonly known as: ELIQUIS  Take 1 tablet (5 mg total) by mouth 2 (two) times daily.   diphenhydrAMINE  25 MG tablet Commonly known as: BENADRYL   Take 25 mg by mouth every 6 (six) hours as needed for itching.   diphenhydrAMINE -zinc  acetate cream Commonly known as: BENADRYL  Apply 1 Application topically 3 (three) times daily as needed for itching.   finasteride  5 MG tablet Commonly known as: PROSCAR  Take 5 mg by mouth every evening.   midodrine  5 MG tablet Commonly known as: PROAMATINE  Take 1 tablet (5 mg total) by mouth 3 (three) times daily with meals.   nitroGLYCERIN  0.4 MG SL tablet Commonly known as: NITROSTAT  Place 1 tablet (0.4 mg total) under the tongue every 5 (five) minutes as needed for chest pain.   ondansetron  4 MG disintegrating tablet Commonly known as: ZOFRAN -ODT Take 1 tablet (4 mg total) by mouth every 8 (eight) hours as needed for nausea or vomiting.   oxyCODONE  5 MG immediate release tablet Commonly known as: Oxy IR/ROXICODONE  Take 1 tablet (5 mg total) by mouth every 4 (four) hours as needed for moderate pain (pain score 4-6).   polyethylene glycol 17 g packet Commonly known as: MIRALAX  / GLYCOLAX  Take 17 g by mouth daily as needed.   rosuvastatin  10 MG  tablet Commonly known as: CRESTOR  Take 4 tablets (40 mg total) by mouth daily.   senna-docusate 8.6-50 MG tablet Commonly known as: Senokot-S Take 2 tablets by mouth at bedtime.               Discharge Care Instructions  (From admission, onward)           Start     Ordered   10/20/24 0000  Discharge wound care:       Comments: Record drain output per General surgery   10/20/24 0959            Follow-up Information     Ann Fine, MD Follow up on 10/23/2024.   Specialty: General Surgery Why: 10/23/24 at 11:15 Please bring a copy of your photo ID, insurance card, arrive 30 minutes prior to your appointment. Contact information: [address] [place] [address] [phone]         Ann Fine, MD Follow up on 11/01/2024.   Specialty: General Surgery Why: 11/01/24 at 10:30.  Please bring a copy of your photo ID, insurance card, arrive 30 minutes prior to your appointment. Contact information: [address], [address] [place] [address] [phone]         Espinoza, Alejandra, DO Follow up in 1 week(s).   Specialty: Family Medicine Why: BP check Contact information: [address] [place] [address] [phone]                  Time coordinating discharge: 45 minutes  Signed:  Harlene RAYMOND Bowl DO  [facility] 10/20/2024, 9:59 AM

## 2024-10-20 NOTE — Progress Notes (Signed)
 Mobility Specialist Progress Note:   10/20/24 0950  Therapy Vitals  Pulse Rate 90  Patient Position (if appropriate) Lying  Mobility  Activity Ambulated independently  Level of Assistance Independent  Assistive Device None  Distance Ambulated (ft) 260 ft  Activity Response Tolerated well  Mobility Referral Yes  Mobility visit 1 Mobility  Mobility Specialist Start Time (ACUTE ONLY) 0950  Mobility Specialist Stop Time (ACUTE ONLY) 0955  Mobility Specialist Time Calculation (min) (ACUTE ONLY) 5 min   Pt received in bed, agreeable to mobility session. Ambulated in hallway independently, no physical assistance required. Tolerated well, asx throughout. Max HR 108 bpm. Returned to room, all needs met, eager for d/c.   Kyle Matthews Mobility Specialist Please contact via SecureChat or  Rehab office at [phone]

## 2024-10-20 NOTE — Progress Notes (Unsigned)
 Electrophysiology Office Note:   Date:  10/22/2024  ID:  Kyle Matthews, DOB 1946-11-21, MRN [id number]  Primary Cardiologist: Lurena MARLA Red, MD Primary Heart Failure: None Electrophysiologist: Will Gladis Norton, MD       History of Present Illness:   Kyle Matthews is a 78 y.o. male with h/o severe AS s/p AVR with 23 mm Edwards Inspiris Resilia pericardial valve in 11/24, CAD s/p CABG (LIMA to LAD, SVG to Ramus) at time of AVR, post-op AF, HTN, HLD, CVA, COPD, tobacco use, anemia, BPH s/p robot-assisted prostatectomy seen today for routine electrophysiology follow-up s/p Pacemaker implant.  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 MRI of brain on initial work up showed L occipital lobe infarct.  He underwent redo sternotomy with Bentall procedure and tricuspid valve replacement (27 mm MDT Mosaic porcine valve), with CABG x1. 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 S/p PPM (Atrial lead & CS lead) on 07/01/24.   DCCV 09/13/24 with 100j x1 and converted to ASVP.   Admit 10/8-10/16 for cholelithiasis.  Readmitted 10/21 - 10/20/24 with N/V, upper abdominal pain s/p laparoscopic cholecystectomy on 10/14/24.  Since last being seen in our clinic the patient reports doing very well. He notes it took him longer to recover after this heart surgery than the one last fall. He feels he has had some mild fatigue but is doing well after his gallbladder surgery. No device specific concerns.    He denies chest pain, palpitations, dyspnea, PND, orthopnea, nausea, vomiting, dizziness, syncope, edema, weight gain, or early satiety.    Review of systems complete and found to be negative unless listed in HPI.   EP Information / Studies Reviewed:    EKG is ordered today. Personal review as below.  EKG Interpretation Date/Time:  Tuesday October 22 2024 11:11:17 EST Ventricular Rate:  86 PR Interval:    QRS Duration:  178 QT Interval:  498 QTC Calculation: 595 R Axis:   243  Text  Interpretation: Ventricular-paced rhythm Confirmed by Aniceto Jarvis ([id number]) on 10/22/2024 11:21:24 AM   PPM Interrogation-  reviewed in detail today,  See PACEART report.  Device History: Abbott Dual Chamber PPM implanted 07/01/24 for CHB > RA + CS lead. RV port plugged.  Dependent    Arrhythmia / AAD / Pertinent EP Studies AF > initial dx ~ 11/2023 in post op period   Risk Assessment/Calculations:    CHA2DS2-VASc Score = 6   This indicates a 9.7% annual risk of stroke. The patient's score is based upon: CHF History: 0 HTN History: 1 Diabetes History: 0 Stroke History: 2 Vascular Disease History: 1 Age Score: 2 Gender Score: 0             Physical Exam:   VS:  BP 116/80 (BP Location: Left Arm, Patient Position: Sitting, Cuff Size: Large)   Pulse 86   Resp 16   Ht 5' 3 (1.6 m)   Wt 144 lb 6.4 oz (65.5 kg)   BMI 25.58 kg/m    Wt Readings from Last 3 Encounters:  10/22/24 144 lb 6.4 oz (65.5 kg)  10/14/24 145 lb 1 oz (65.8 kg)  09/25/24 155 lb (70.3 kg)     GEN: Well nourished, well developed in no acute distress NECK: No JVD; No carotid bruits CARDIAC: Regular rate and rhythm, no murmurs, rubs, gallops. PPM site wnl, no tethering  RESPIRATORY:  Clear to auscultation without rales, wheezing or rhonchi  ABDOMEN: Soft, non-tender, non-distended EXTREMITIES:  No edema; No deformity   ASSESSMENT AND  PLAN:    CHB s/p Abbott PPM  Note configuration of device above  -Normal PPM function -See Pace Art report -No changes today  Staph Epidermis Endocarditis  -per ID   CAD  VHD s/p AVR and TVR  S/p CABG 10/2023, redo 06/26/24 -per TCTS   Persistent Atrial Fibrillation High Risk Medication Monitoring: Amiodarone    CHA2DS2-VASc 6 -no symptom burden  > 0% AF burden by device   -OAC for stroke prophylaxis  -EKG with VP paced rhythm   -amiodarone  200 mg daily  -amio labs > LFT's wnl 09/2024 > will plan to check labs annually when here but he has an appt with PCP in  2 weeks and will ask for LFT's / TSH at the 6 month interval   Secondary Hypercoagulable State  -continue Eliquis  5mg  BID, dose reviewed and appropriate by age / wt     Disposition:   Follow up with Dr. Inocencio in 12 months  Signed, Daphne Barrack, NP-C, AGACNP-BC [facility] - Electrophysiology  10/22/2024, 12:06 PM

## 2024-10-20 NOTE — Progress Notes (Signed)
 Order received to discharge patient.  Telemetry monitor removed and CCMD notified.  PIV access removed.  Discharge instructions, follow up, medications and instructions for their use discussed with patient and wife at bedside.  Patient and wife instructed with regard to emptying and charging abdominal JP drain until MD follow up appointment.

## 2024-10-20 NOTE — Progress Notes (Signed)
 6 Days Post-Op   Subjective/Chief Complaint: He feels even better this morning Ambulating well Tolerating po JP drain output is minimal   Objective: Vital signs in last 24 hours: Temp:  [97.9 F (36.6 C)-98.4 F (36.9 C)] 98.4 F (36.9 C) (11/02 0721) Pulse Rate:  [70-78] 77 (11/02 0013) Resp:  [16-20] 16 (11/02 0721) BP: (99-134)/(49-72) 134/49 (11/02 0721) SpO2:  [97 %-100 %] 99 % (11/02 0721) Last BM Date : 10/16/24  Intake/Output from previous day: 11/01 0701 - 11/02 0700 In: -  Out: 315 [Urine:300; Drains:15] Intake/Output this shift: No intake/output data recorded.  Exam: Awake and alert Looks good Abdomen soft, drain with minimal output, thin bile  Lab Results:  Recent Labs    10/18/24 0310 10/18/24 1519 10/19/24 0254  WBC 9.8  --  8.3  HGB 8.8* 8.6* 8.7*  HCT 27.0* 27.4* 27.0*  PLT 152  --  183   BMET Recent Labs    10/18/24 0310 10/19/24 0254  NA 138 139  K 4.2 4.0  CL 105 105  CO2 22 24  GLUCOSE 94 86  BUN 36* 26*  CREATININE 1.02 0.92  CALCIUM  8.5* 8.4*   PT/INR No results for input(s): LABPROT, INR in the last 72 hours. ABG No results for input(s): PHART, HCO3 in the last 72 hours.  Invalid input(s): PCO2, PO2  Studies/Results: No results found.  Anti-infectives: Anti-infectives (From admission, onward)    Start     Dose/Rate Route Frequency Ordered Stop   10/14/24 1400  amoxicillin-clavulanate (AUGMENTIN) 875-125 MG per tablet 1 tablet        1 tablet Oral Every 12 hours 10/14/24 1310 10/18/24 2106   10/14/24 0715  ceFAZolin  (ANCEF ) IVPB 2g/100 mL premix        2 g 200 mL/hr over 30 Minutes Intravenous On call to O.R. 10/14/24 0712 10/14/24 0825   10/11/24 0600  ceFAZolin  (ANCEF ) IVPB 2g/100 mL premix  Status:  Discontinued        2 g 200 mL/hr over 30 Minutes Intravenous On call to O.R. 10/10/24 1125 10/11/24 0946   10/08/24 2200  piperacillin -tazobactam (ZOSYN ) IVPB 3.375 g  Status:  Discontinued        3.375  g 12.5 mL/hr over 240 Minutes Intravenous Every 8 hours 10/08/24 2050 10/12/24 1128       Assessment/Plan: POD 6 s/p laparoscopic fenestrating subtotal cholecystectomy by Dr. Ann on 10/14/24 - S/p ERCP with biliary sphincterotomy and balloon sweeping 10/24 by Dr. Wilhelmenia for Choledocholithiasis   -he is doing well and it is ok for discharge from a surgical standpoint.  Follow up being arranged for this week as the drain can likely be removed in the office soon  Vicenta Poli MD 10/20/2024

## 2024-10-22 ENCOUNTER — Ambulatory Visit: Payer: Self-pay | Admitting: Cardiology

## 2024-10-22 ENCOUNTER — Encounter: Payer: Self-pay | Admitting: Pulmonary Disease

## 2024-10-22 ENCOUNTER — Ambulatory Visit: Attending: Pulmonary Disease | Admitting: Pulmonary Disease

## 2024-10-22 VITALS — BP 116/80 | HR 86 | Resp 16 | Ht 63.0 in | Wt 144.4 lb

## 2024-10-22 DIAGNOSIS — Z950 Presence of cardiac pacemaker: Secondary | ICD-10-CM | POA: Diagnosis not present

## 2024-10-22 DIAGNOSIS — D6869 Other thrombophilia: Secondary | ICD-10-CM

## 2024-10-22 DIAGNOSIS — I442 Atrioventricular block, complete: Secondary | ICD-10-CM | POA: Diagnosis not present

## 2024-10-22 DIAGNOSIS — I484 Atypical atrial flutter: Secondary | ICD-10-CM

## 2024-10-22 DIAGNOSIS — I4819 Other persistent atrial fibrillation: Secondary | ICD-10-CM | POA: Diagnosis not present

## 2024-10-22 LAB — CUP PACEART INCLINIC DEVICE CHECK
Date Time Interrogation Session: 20251104120206
Implantable Lead Connection Status: 753985
Implantable Lead Connection Status: 753985
Implantable Lead Implant Date: 20250714
Implantable Lead Implant Date: 20250714
Implantable Lead Location: 753858
Implantable Lead Location: 753859
Implantable Pulse Generator Implant Date: 20250714
Pulse Gen Model: 3562
Pulse Gen Serial Number: 8291881

## 2024-10-22 NOTE — Patient Instructions (Signed)
 Medication Instructions:  Your physician recommends that you continue on your current medications as directed. Please refer to the Current Medication list given to you today.  *If you need a refill on your cardiac medications before your next appointment, please call your pharmacy*  Lab Work: Have your primary care provider draw a liver enzyme and a thyroid  panel when you see them If you have labs (blood work) drawn today and your tests are completely normal, you will receive your results only by: MyChart Message (if you have MyChart) OR A paper copy in the mail If you have any lab test that is abnormal or we need to change your treatment, we will call you to review the results.  Follow-Up: At [facility], you and your health needs are our priority.  As part of our continuing mission to provide you with exceptional heart care, our providers are all part of one team.  This team includes your primary Cardiologist (physician) and Advanced Practice Providers or APPs (Physician Assistants and Nurse Practitioners) who all work together to provide you with the care you need, when you need it.  Your next appointment:   1 year(s)  Provider:   Soyla Norton, MD or Daphne Barrack, NP

## 2024-10-30 ENCOUNTER — Encounter: Payer: Self-pay | Admitting: Surgery

## 2024-10-30 ENCOUNTER — Ambulatory Visit: Attending: Surgery | Admitting: Surgery

## 2024-10-30 ENCOUNTER — Ambulatory Visit (HOSPITAL_COMMUNITY)
Admission: RE | Admit: 2024-10-30 | Discharge: 2024-10-30 | Disposition: A | Discharge status: Home / Self Care | Source: Ambulatory Visit | Attending: Cardiovascular Disease | Admitting: Cardiovascular Disease

## 2024-10-30 VITALS — BP 119/73 | HR 92 | Resp 20 | Ht 63.0 in | Wt 158.0 lb

## 2024-10-30 DIAGNOSIS — Z09 Encounter for follow-up examination after completed treatment for conditions other than malignant neoplasm: Secondary | ICD-10-CM

## 2024-10-30 DIAGNOSIS — Z951 Presence of aortocoronary bypass graft: Secondary | ICD-10-CM | POA: Insufficient documentation

## 2024-10-31 NOTE — Progress Notes (Signed)
 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  The source of this was felt to be an indwelling Foley that had been present for 9 months due to severe BPH. MRI brain showed a small acute occipital infarct with no deficit.  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  He has been followed in the EP clinic.  He was treated with a 6-week course of intravenous antibiotics postoperatively.  He was subsequently admitted from 10/8 to 10/03/2024 for cholelithiasis.  He required readmission from 10/21 to 10/20/2024 for abdominal pain and nausea/vomiting.  He underwent laparoscopic cholecystectomy on 10/14/2024 which was incomplete due to adhesions between the gallbladder and the duodenum.  He had a drain placed in the right upper quadrant which is still in place and continues to drain.  He said that he feels much better now and is walking without chest pain.  He feels like his stamina is improving.  He still gets winded with exertion.  He had a follow-up echo on 08/01/2024 the aortic valve gradient to be 7.7 mmHg with no paravalvular leak.  The mean tricuspid valve gradient was 4 mmHg with trivial regurgitation.  Left ventricular ejection fraction was 60 to 65%.  Right ventricular function was normal.  Current Outpatient Medications  Medication Sig Dispense Refill   acetaminophen  (TYLENOL ) 325 MG tablet  Take 2 tablets (650 mg total) by mouth every 6 (six) hours as needed for mild pain (pain score 1-3) or fever (or Fever >/= 101).     amiodarone  (PACERONE ) 200 MG tablet Take 1 tablet (200 mg total) by mouth daily. 90 tablet 1   apixaban  (ELIQUIS ) 5 MG TABS tablet Take 1 tablet (5 mg total) by mouth 2 (two) times daily. 60 tablet 6   diphenhydrAMINE  (BENADRYL ) 25 MG tablet Take 25 mg by mouth every 6 (six) hours as needed for itching.     diphenhydrAMINE -zinc  acetate (BENADRYL ) cream Apply 1 Application topically 3 (three) times daily as needed for itching.     finasteride  (PROSCAR ) 5 MG tablet Take 5 mg by mouth every evening.     midodrine  (PROAMATINE ) 5 MG tablet Take 1 tablet (5 mg total) by mouth 3 (three) times daily with meals. 30 tablet 0   nitroGLYCERIN  (NITROSTAT ) 0.4 MG SL tablet Place 1 tablet (0.4 mg total) under the tongue every 5 (five) minutes as needed for chest pain. 25 tablet 3   ondansetron  (ZOFRAN -ODT) 4 MG disintegrating tablet Take 1 tablet (4 mg total) by mouth every 8 (eight) hours as needed for nausea or vomiting. 20 tablet 0   oxyCODONE  (OXY IR/ROXICODONE ) 5 MG immediate release tablet Take 1 tablet (5 mg total) by mouth every 4 (four) hours as needed for moderate pain (pain score 4-6). 30 tablet 0   polyethylene glycol (MIRALAX  / GLYCOLAX ) 17 g packet Take 17 g by mouth daily as  needed.     rosuvastatin  (CRESTOR ) 40 MG tablet Take 1 tablet (40 mg total) by mouth daily. 30 tablet 3   senna-docusate (SENOKOT-S) 8.6-50 MG tablet Take 2 tablets by mouth at bedtime. 60 tablet 1   No current facility-administered medications for this visit.     Physical Exam: BP 119/73   Pulse 92   Resp 20   Ht 5' 3 (1.6 m)   Wt 158 lb (71.7 kg)   SpO2 95%   BMI 27.99 kg/m  He looks well. Cardiac exam shows a regular rate and rhythm with normal heart sounds.  There is no murmur. Lungs are clear. The chest is well-healed and the sternum is stable. There is a JP drain in the right  upper quadrant with some thin slightly green-tinged fluid and fibrinous debris in the JP bulb. There is no peripheral edema.  Diagnostic Tests:  Narrative & Impression  EXAM: 2 VIEW(S) XRAY OF THE CHEST 10/30/2024 03:28:00 PM   COMPARISON: 10/08/2024 and 06/12/2024   CLINICAL HISTORY: CABG   FINDINGS:   LUNGS AND PLEURA: No focal pulmonary opacity. No pleural effusion. No pneumothorax.   HEART AND MEDIASTINUM: Mild cardiomegaly. Tortuous aorta with aortic atherosclerosis. Sternotomy wires with CABG markers. Left chest pacemaker with leads terminating in the right atrium, right ventricle, and coronary sinus.   BONES AND SOFT TISSUES: Multilevel thoracic osteophytosis.   IMPRESSION: 1. Mild cardiomegaly. Otherwise, no acute cardiopulmonary abnormality.   Electronically signed by: Rogelia Myers MD 10/30/2024 04:30 PM EST RP Workstation: [id number]      Impression:  I think he is doing well 4 months following his complicated surgery for prosthetic valve endocarditis with direct extension to the tricuspid valve.  I have recommended a follow-up CTA of the chest in 3 months as a baseline.  I told him they can return to normal activity without any restrictions.  Plan:  He will continue to follow-up with general surgery concerning his cholecystectomy and gallbladder fossa drain.  I will plan to see him back in 3 months with a CTA of the chest.   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   Dorise MARLA Fellers, MD [facility] [phone]

## 2024-11-01 ENCOUNTER — Other Ambulatory Visit: Payer: Self-pay

## 2024-11-01 ENCOUNTER — Ambulatory Visit: Admitting: Internal Medicine

## 2024-11-01 ENCOUNTER — Encounter: Payer: Self-pay | Admitting: Internal Medicine

## 2024-11-01 VITALS — BP 137/80 | HR 88 | Ht 63.0 in | Wt 154.0 lb

## 2024-11-01 DIAGNOSIS — Z9049 Acquired absence of other specified parts of digestive tract: Secondary | ICD-10-CM | POA: Diagnosis not present

## 2024-11-01 NOTE — Progress Notes (Signed)
 RFV: hospital follow up  Patient ID: Kyle Matthews, male   DOB: [birth year], 78 y.o.   MRN: [id number]  HPI Kyle Matthews is a 78 y.o. male who is  hx of AVR and subsequent had early cholecystitis s/p subtotal cholecystectomy on 10/14/24 with post-op bile leak, s/p drain that is now spontaneous resolved without ERCP.no fever or chills, no abdominal discomfort No need for further abtx. No abtx for the past month. 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          Outpatient Encounter Medications as of 11/01/2024  Medication Sig   acetaminophen  (TYLENOL ) 325 MG tablet Take 2 tablets (650 mg total) by mouth every 6 (six) hours as needed for mild pain (pain score 1-3) or fever (or Fever >/= 101).   amiodarone  (PACERONE ) 200 MG tablet Take 1 tablet (200 mg total) by mouth daily.   apixaban  (ELIQUIS ) 5 MG TABS tablet Take 1 tablet (5 mg total) by mouth 2 (two) times daily.   diphenhydrAMINE  (BENADRYL ) 25 MG tablet Take 25 mg by mouth every 6 (six) hours as needed for itching.   diphenhydrAMINE -zinc  acetate (BENADRYL ) cream Apply 1 Application topically 3 (three) times daily as needed for itching.   finasteride  (PROSCAR ) 5 MG tablet Take 5 mg by mouth every evening.   midodrine  (PROAMATINE ) 5 MG tablet Take 1 tablet (5 mg total) by mouth 3 (three) times daily with meals.   nitroGLYCERIN  (NITROSTAT ) 0.4 MG SL tablet Place 1 tablet (0.4 mg total) under the tongue every 5 (five) minutes as needed for chest pain.   ondansetron  (ZOFRAN -ODT) 4 MG disintegrating tablet Take 1 tablet (4 mg total) by mouth every 8 (eight) hours as needed for nausea or vomiting.   oxyCODONE  (OXY IR/ROXICODONE ) 5 MG immediate release tablet Take 1 tablet (5 mg total) by mouth every 4 (four) hours as needed for moderate pain (pain score 4-6).   polyethylene glycol (MIRALAX  / GLYCOLAX ) 17 g packet Take 17 g by mouth daily as needed.   rosuvastatin  (CRESTOR ) 40 MG tablet  Take 1 tablet (40 mg total) by mouth daily.   senna-docusate (SENOKOT-S) 8.6-50 MG tablet Take 2 tablets by mouth at bedtime.   No facility-administered encounter medications on file as of 11/01/2024.     Patient Active Problem List   Diagnosis Date Noted   Erosive gastritis 10/11/2024   Dilated bile duct 10/11/2024   Choledocholithiasis 10/08/2024   Elevated LFTs 10/08/2024   AKI (acute kidney injury) 10/08/2024   Prolonged QT interval 10/08/2024   Perforation of gallbladder in cholecystitis 09/27/2024   Atrial flutter (HCC) 09/13/2024   Recurrent biliary colic 08/21/2024   Symptomatic cholelithiasis 08/20/2024   Atrial fibrillation and flutter (HCC) 08/20/2024   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   S/P Bentall Procedure 07/01/2024   History of stroke 06/18/2024   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   S/P AVR (aortic valve replacement) 11/02/2023   Chronic heart failure with preserved ejection fraction (HFpEF) (HCC) 11/01/2023   Benign hypertension 10/30/2023   Atherosclerosis of native coronary artery of native heart without angina pectoris 10/28/2023   GERD (gastroesophageal reflux disease) 10/23/2023   Coronary artery disease involving native coronary artery of native heart with unstable angina pectoris (HCC) 10/10/2023   BPH with urinary obstruction 10/04/2023   Kidney lesion, native, left 10/03/2023   Lesion of right  native kidney 10/03/2023   Elevated brain natriuretic peptide (BNP) level 10/03/2023   Coronary artery disease 10/03/2023   Enlarged prostate 10/03/2023   Moderate aortic regurgitation 10/03/2023   Hyperlipidemia 05/20/2017   Nicotine dependence, cigarettes, uncomplicated 05/20/2017   Carotid bruit 05/19/2017   Essential hypertension    RBBB 04/13/2017     Health Maintenance Due  Topic Date Due   Hepatitis C Screening  Never done   DTaP/Tdap/Td (1 - Tdap)  Never done   Zoster Vaccines- Shingrix (1 of 2) 07/28/1967   COVID-19 Vaccine (4 - 2025-26 season) 08/19/2024   Medicare Annual Wellness (AWV)  11/22/2024     Physical Exam  BP 137/80   Pulse 88   Ht 5' 3 (1.6 m)   Wt 154 lb (69.9 kg)   SpO2 97%   BMI 27.28 kg/m  Physical Exam  Constitutional:  oriented to person, place, and time. appears well-developed and well-nourished. No distress.  HENT: [place]/AT, PERRLA, no scleral icterus Mouth/Throat: Oropharynx is clear and moist. No oropharyngeal exudate.  Cardiovascular: Normal rate, regular rhythm and normal heart sounds. Exam reveals no gallop and no friction rub.  No murmur heard.  Pulmonary/Chest: Effort normal and breath sounds normal. No respiratory distress.  has no wheezes.  Neck = supple, no nuchal rigidity Abdominal: Soft. Bowel sounds are normal.  exhibits no distension. There is no tenderness.  Lymphadenopathy: no cervical adenopathy. No axillary adenopathy Neurological: alert and oriented to person, place, and time.  Skin: Skin is warm and dry. No rash noted. No erythema.  Psychiatric: a normal mood and affect.  behavior is normal.    CBC Lab Results  Component Value Date   WBC 8.3 10/19/2024   RBC 3.20 (L) 10/19/2024   HGB 8.7 (L) 10/19/2024   HCT 27.0 (L) 10/19/2024   PLT 183 10/19/2024   MCV 84.4 10/19/2024   MCH 27.2 10/19/2024   MCHC 32.2 10/19/2024   RDW 17.8 (H) 10/19/2024   LYMPHSABS 0.7 08/20/2024   MONOABS 0.5 08/20/2024   EOSABS 139 09/11/2024    BMET Lab Results  Component Value Date   NA 139 10/19/2024   K 4.0 10/19/2024   CL 105 10/19/2024   CO2 24 10/19/2024   GLUCOSE 86 10/19/2024   BUN 26 (H) 10/19/2024   CREATININE 0.92 10/19/2024   CALCIUM  8.4 (L) 10/19/2024   GFRNONAA >60 10/19/2024      Assessment and Plan  Hx of cholecystectomy c/b bile leak s/p drain =  steadily improving. No longer needs abtx.  Rtc as needed

## 2024-11-01 NOTE — Progress Notes (Signed)
   PROVIDER:  RICHERD SILVERSMITH, MD  MRN: [id number] DOB: 1946/10/05 DATE OF ENCOUNTER: 11/01/2024 Interval History:     Patient s/p subtotal cholecystectomy on 10/27 with post-op bile leak. Bile leak has now resolved with conservative management and without ERCP.  No bile in drain for second visit in a row. Serous fluid with 40-60cc out over last several days. No changes in color of drainage. No fevers/chills. No abdominal discomfort. Tolerating PO.     Objective  Physical Exam Gen: NAD CV: RRR Pulm: NWOB Abd: Soft, appropriately tender around drain. All other incisions well approximated.   Assessment and Plan:     Kyle Matthews is a 78 y.o. male who is s/p subtotal cholecystectomy on 10/14/24 who presents today for drain check.  Diagnoses and all orders for this visit:  S/P laparoscopic cholecystectomy    - Drain removed today in clinic - Follow up PRN - Continue lifting restrictions x 6 weeks. - Patient knows to call us  if any fevers/chills, RUQ pain, N/V after drain removal.  The plan was discussed in detail with the patient today, who expressed understanding.  The patient has our contact information, and understands to call the clinic with any additional questions or concerns in the interval.  I would be happy to see the patient back.  RICHERD SILVERSMITH, MD

## 2024-11-07 NOTE — Progress Notes (Signed)
 Patient ID: Kyle Matthews MRN: [id number] DOB/AGE: 07/23/1946 78 y.o.  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 mm Medtronic Mosaic July 2025 Mean TV gradient 4 mmHg, low normal RV function TAPSE 0.6 TTE August 2025 MR Moderate, TTE August 2025 CAD CABG LIMA to LAD, vein graft to ramus November 2024 CABG x 1 and vein graft to RCA July 2025 PAF/atypical atrial flutter Status post cardioversion September 2025 CHB Status post permanent pacemaker July 2025 Diastolic dysfunction LVH, G1 DD, EF 60 to 65%, normally functioning AVR TTE December 2024 Hypertension Hyperlipidemia LP(a) 120 Aortic atherosclerosis Chest x-ray 2024 Remote stroke BPH complicated by bladder obstruction requiring indwelling Foley catheter AKI with creatinine of 8.5 Recurrent hematuria requiring multiple blood transfusions Prostate artery embolization November 2024 Prostatectomy 2025 BMI 27  09/2023: The patient is a 78 y.o. male with the indicated medical history here for hospital follow-up and for recommendations regarding his mixed aortic valvular disease.  He was admitted to the hospital with acute on chronic diastolic heart failure thought caused by severe hematuria and severe anemia.  He is received multiple blood transfusions due to hemoglobin of less than 6.  He was planned to undergo laparoscopic prostatectomy however this was postponed given his admission with acute on chronic diastolic heart failure.  He underwent coronary angiography which demonstrated moderate left main  lesion with an RFR of 0.83.  He was seen by cardiothoracic surgery and a TAVR protocol CTA was performed as well.  He was ultimately started on dual antiplatelet therapy and is here today to discuss treatment options.  Unfortunately he presented to the emergency department 3 days after discharge.  He noted gross hematuria and inability to pass urine.  His Foley catheter was irrigated and urine flow was restored.  He was discharged home.  His DAPT was stopped.  He continues to have hematuria but it is improving.  He had an episode of chest burning at rest.  He has had no reproducible exertional angina or exertional dyspnea.  He has had some peripheral edema which has worsened since he was discharged from the hospital.  He reports a low-sodium diet and he is unsure exactly why this edema is occurring.  Plan: Given hematuria refer for surgical AVR plus CABG.   2/25: In the interim before elective surgery could be performed the patient presented to [facility] with recurrent hematuria and a hemoglobin of 5.4.  He ended up requiring prostate artery embolization.  During that hospitalization he had his AVR and CABG performed.  His postoperative course was complicated by chronic anemia requiring transfusions and postoperative atrial fibrillation requiring amiodarone  bolus with conversion to normal sinus rhythm.  He was seen in cardiology clinic for follow-up in cardiology clinic in December and was doing well.  His blood pressure is well-controlled.  He had no complaints of recurrent hematuria.  He was noted to be in sinus rhythm.   The patient is doing well today.  His biggest issue remains ongoing hematuria and the need for a catheter.  He will be seeing urology to discuss a prostatectomy.  He feels much better and  much less short of breath.  He has a lot of energy in the morning but he needs to take a nap in the afternoons.  He has not yet participated in cardiac rehabilitation.  His sternal incision is  healing well.  He denies any severe bleeding not related to hematuria.  He said no issues with his medicines.  He denies any palpitations, paroxysmal atrial dyspnea, orthopnea.  He otherwise feels well and is without significant cardiovascular complaints today.  Plan: Follow-up in 6 months, check lipids and LFTs.  November 2025:  Patient consents to use of AI scribe. 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  He underwent reoperation as detailed above.  His course was complicated by symptomatic bradycardia requiring a permanent pacemaker.  He also developed postoperative atrial fibrillation and atrial flutter.  He was treated with Eliquis  and amiodarone .  He did undergo cardioversion in September.  He developed cholelithiasis which was symptomatic and he underwent uncomplicated laparoscopic cholecystectomy in October.  He was last seen in the EP clinic earlier this month.  At that point in time he was doing well.  In June, he underwent a prostatectomy, where most of the prostate was removed, leaving a small portion by design. The procedure took place on May 22, 2024.  He developed atrial fibrillation and atrial flutter, for which he underwent cardioversion in September. He is currently on a blood thinner with no issues with bleeding.  He had gallbladder surgery at the end of October and had a drainage tube removed three weeks ago.  He experiences fatigue, particularly after outings, and did not participate in cardiac rehabilitation after his surgeries due to discomfort from the Foley catheter and other reasons. No chest pain or breathing difficulties, although he experiences occasional swelling in his feet, which previously prevented him from wearing regular shoes.       Past Medical History:  Diagnosis Date   Anemia    Anxiety    Arthritis    BPH (benign prostatic hyperplasia)     Status post biopsy in 2009. - w/ LUTS   Coronary artery disease    a. 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   Severe aortic stenosis 04/13/2017   Stroke (HCC) 2007   No true etiology noted   UTI (urinary tract infection)     Past Surgical History:  Procedure Laterality Date   AORTIC VALVE REPLACEMENT N/A 11/02/2023   Procedure: AORTIC VALVE REPLACEMENT (AVR) USING 23 MM INSPIRIS RESILIA AORTIC VALVE;  Surgeon: Lucas Dorise POUR, MD;  Location: [facility] OR;  Service: Open Heart Surgery;  Laterality: N/A;   BENTALL PROCEDURE N/A 06/26/2024   Procedure: BENTALL PROCEDURE USING KONECT AORTIC VALVE CONDUIT SIZE ;  Surgeon: Lucas Dorise POUR, MD;  Location: [facility] OR;  Service: Open Heart Surgery;  Laterality: N/A;   CARDIOVERSION N/A 09/13/2024   Procedure: CARDIOVERSION;  Surgeon: Kate Lonni CROME, MD;  Location: [facility]  INVASIVE CV LAB;  Service: Cardiovascular;  Laterality: N/A;   CHOLECYSTECTOMY N/A 10/14/2024   Procedure: LAPAROSCOPIC CHOLECYSTECTOMY, SUBTOTAL;  Surgeon: Ann Fine, MD;  Location: [facility] OR;  Service: General;  Laterality: N/A;   CORONARY ARTERY BYPASS GRAFT N/A 11/02/2023   Procedure: CORONARY ARTERY BYPASS GRAFTING (CABG) X TWO USING LEFT INTERNAL MAMMARY ARTERY AND RIGHT GREATER SAPHENEOUS VEIN HARVESTED ENDOSCOPICLY;  Surgeon: Lucas Dorise POUR, MD;  Location: [facility] OR;  Service: Open Heart Surgery;  Laterality: N/A;   CORONARY ARTERY BYPASS GRAFT N/A 06/26/2024   Procedure: CORONARY ARTERY BYPASS  GRAFTING (CABG) TIMES ONE USING ENDOSCOPICALLY HARVESTED LEFT GREATER SAPHENOUS VEIN;  Surgeon: Lucas Dorise POUR, MD;  Location: [facility] OR;  Service: Open Heart Surgery;  Laterality: N/A;   CORONARY PRESSURE/FFR STUDY N/A 10/09/2023   Procedure: CORONARY PRESSURE/FFR STUDY;  Surgeon: Wendel Lurena POUR, MD;  Location: [facility] INVASIVE CV LAB;  Service: Cardiovascular;  Laterality: N/A;   ERCP N/A 10/11/2024   Procedure: ERCP, WITH INTERVENTION IF INDICATED;  Surgeon: Wilhelmenia Aloha Raddle., MD;  Location[facility] ENDOSCOPY;  Service: Gastroenterology;  Laterality: N/A;   INDOCYANINE GREEN  FLUORESCENCE IMAGING (ICG) N/A 10/14/2024   Procedure: INDOCYANINE GREEN  FLUORESCENCE IMAGING (ICG);  Surgeon: Ann Fine, MD;  Location: [facility] OR;  Service: General;  Laterality: N/A;   IR ANGIOGRAM PELVIS SELECTIVE OR SUPRASELECTIVE  10/27/2023   IR ANGIOGRAM PELVIS SELECTIVE OR SUPRASELECTIVE  10/27/2023   IR ANGIOGRAM SELECTIVE EACH ADDITIONAL VESSEL  10/27/2023   IR ANGIOGRAM SELECTIVE EACH ADDITIONAL VESSEL  10/27/2023   IR ANGIOGRAM SELECTIVE EACH ADDITIONAL VESSEL  10/27/2023   IR ANGIOGRAM SELECTIVE EACH ADDITIONAL VESSEL  10/27/2023   IR EMBO ARTERIAL NOT HEMORR HEMANG INC GUIDE ROADMAPPING  10/27/2023   IR US  GUIDE VASC ACCESS RIGHT  10/27/2023   PACEMAKER IMPLANT N/A 07/01/2024   Procedure: PACEMAKER IMPLANT;  Surgeon: Inocencio Soyla Lunger, MD;  Location: [facility] INVASIVE CV LAB;  Service: Cardiovascular;  Laterality: N/A;   REDO STERNOTOMY N/A 06/26/2024   Procedure: REDO STERNOTOMY;  Surgeon: Lucas Dorise POUR, MD;  Location: [facility] OR;  Service: Open Heart Surgery;  Laterality: N/A;   RIGHT HEART CATH AND CORONARY ANGIOGRAPHY N/A 06/20/2024   Procedure: RIGHT HEART CATH AND CORONARY ANGIOGRAPHY;  Surgeon: Ladona Heinz, MD;  Location: [facility] INVASIVE CV LAB;  Service: Cardiovascular;  Laterality: N/A;   RIGHT/LEFT HEART CATH AND CORONARY ANGIOGRAPHY N/A 10/09/2023   Procedure: RIGHT/LEFT HEART CATH AND CORONARY ANGIOGRAPHY;  Surgeon:  Wendel Lurena POUR, MD;  Location: [facility] INVASIVE CV LAB;  Service: Cardiovascular;  Laterality: N/A;   TEE WITHOUT CARDIOVERSION N/A 11/02/2023   Procedure: TRANSESOPHAGEAL ECHOCARDIOGRAM;  Surgeon: Lucas Dorise POUR, MD;  Location: [facility] OR;  Service: Open Heart Surgery;  Laterality: N/A;   TEE WITHOUT CARDIOVERSION N/A 06/26/2024   Procedure: ECHOCARDIOGRAM, TRANSESOPHAGEAL;  Surgeon: Lucas Dorise POUR, MD;  Location: [facility] OR;  Service: Open Heart Surgery;  Laterality: N/A;   TRANSESOPHAGEAL ECHOCARDIOGRAM (CATH LAB) N/A 06/18/2024   Procedure: TRANSESOPHAGEAL ECHOCARDIOGRAM;  Surgeon: Jeffrie Oneil BROCKS, MD;  Location: [facility] INVASIVE CV LAB;  Service: Cardiovascular;  Laterality: N/A;   TRANSTHORACIC ECHOCARDIOGRAM  04/28/2017    EF 65-70%. GR 1 DD. No regional wall motion abnormality. Moderate aortic stenosis with moderate regurgitation. Mean gradient 34 mmHg.   TRICUSPID VALVE REPLACEMENT N/A 06/26/2024   Procedure: TRICUSPID VALVE REPLACEMENT USING MOSIAC BRIOPROSTHESIS VALVE SIZE ;  Surgeon: Lucas Dorise POUR, MD;  Location: [facility] OR;  Service: Open Heart Surgery;  Laterality: N/A;   XI ROBOTIC ASSISTED SIMPLE PROSTATECTOMY N/A 05/22/2024  Procedure: PROSTATECTOMY, SIMPLE, ROBOT-ASSISTED;  Surgeon: Alvaro Ricardo KATHEE Mickey., MD;  Location: [facility] ORS;  Service: Urology;  Laterality: N/A;    Family History  Problem Relation Age of Onset   Coronary artery disease Mother 13       36. Was diagnosed with a blood clot - suspect that this was a STEMI   Lung cancer Father    Thyroid  disease Sister    Healthy Brother 63   Pancreatic cancer Sister 39   Cancer Maternal Grandmother    Heart disease Maternal Grandfather 55   Cancer Paternal Grandmother    Stroke Paternal Grandfather     Social History   Socioeconomic History   Marital status: Married    Spouse name: Not on file   Number of children: 1   Years of education: Not on file   Highest education level: Not on file  Occupational History   Occupation: Retired     Comment: Previously worked in community education officer  Tobacco Use   Smoking status: Every Day    Current packs/day: 0.00    Average packs/day: 1.5 packs/day for 24.0 years (36.0 ttl pk-yrs)    Types: Cigarettes    Last attempt to quit: 08/21/2023    Years since quitting: 1.2    Passive exposure: Past   Smokeless tobacco: Former   Tobacco comments:    Former smoker 07/17/24  Vaping Use   Vaping status: Never Used  Substance and Sexual Activity   Alcohol use: Not Currently    Alcohol/week: 6.0 standard drinks of alcohol    Types: 6 Cans of beer per week   Drug use: No   Sexual activity: Not Currently  Other Topics Concern   Not on file  Social History Narrative   Married father of 1. Lives with his wife of 49 years. He is a retired marine scientist used to work for [employer]   4 cups of coffee a day.    No structured exercise regimen   09-05-24 Pt reports smoking 1 or 2 cigarettes on occasion   Social Drivers of Health   Financial Resource Strain: Not on file  Food Insecurity: No Food Insecurity (10/09/2024)   Hunger Vital Sign    Worried About Running Out of Food in the Last Year: Never true    Ran Out of Food in the Last Year: Never true  Transportation Needs: No Transportation Needs (10/09/2024)   PRAPARE - Administrator, Civil Service (Medical): No    Lack of Transportation (Non-Medical): No  Physical Activity: Not on file  Stress: Not on file  Social Connections: Socially Integrated (10/09/2024)   Social Connection and Isolation Panel    Frequency of Communication with Friends and Family: More than three times a week    Frequency of Social Gatherings with Friends and Family: Twice a week    Attends Religious Services: More than 4 times per year    Active Member of [employer] or Organizations: Yes    Attends Banker Meetings: Never    Marital Status: Married  Catering Manager Violence: Not At Risk (10/09/2024)   Humiliation, Afraid, Rape, and Kick  questionnaire    Fear of Current or Ex-Partner: No    Emotionally Abused: No    Physically Abused: No    Sexually Abused: No     Prior to Admission medications   Medication Sig Start Date End Date Taking? Authorizing Provider  acetaminophen  (TYLENOL ) 325 MG tablet Take 2 tablets (650 mg total) by  mouth every 6 (six) hours as needed for mild pain (pain score 1-3) or fever (or Fever >/= 101). 10/03/24   Fausto Burnard LABOR, DO  amiodarone  (PACERONE ) 200 MG tablet Take 1 tablet (200 mg total) by mouth daily. 08/13/24   Fenton, Clint R, PA  amLODipine  (NORVASC ) 5 MG tablet 1 tablet Orally Once a day; Duration: 30 days 10/30/24   [provider]  apixaban  (ELIQUIS ) 5 MG TABS tablet Take 1 tablet (5 mg total) by mouth 2 (two) times daily. 09/05/24   Fenton, Clint R, PA  diphenhydrAMINE  (BENADRYL ) 25 MG tablet Take 25 mg by mouth every 6 (six) hours as needed for itching.    [provider]  diphenhydrAMINE -zinc  acetate (BENADRYL ) cream Apply 1 Application topically 3 (three) times daily as needed for itching.    [provider]  finasteride  (PROSCAR ) 5 MG tablet Take 5 mg by mouth every evening.    [provider]  midodrine  (PROAMATINE ) 5 MG tablet Take 1 tablet (5 mg total) by mouth 3 (three) times daily with meals. 10/20/24   Vann, Jessica U, DO  nitroGLYCERIN  (NITROSTAT ) 0.4 MG SL tablet Place 1 tablet (0.4 mg total) under the tongue every 5 (five) minutes as needed for chest pain. 10/19/23   Loida Calamia K, MD  ondansetron  (ZOFRAN -ODT) 4 MG disintegrating tablet Take 1 tablet (4 mg total) by mouth every 8 (eight) hours as needed for nausea or vomiting. 10/03/24   Fausto Burnard A, DO  oxyCODONE  (OXY IR/ROXICODONE ) 5 MG immediate release tablet Take 1 tablet (5 mg total) by mouth every 4 (four) hours as needed for moderate pain (pain score 4-6). 10/03/24   Fausto Burnard A, DO  polyethylene glycol (MIRALAX  / GLYCOLAX ) 17 g packet Take 17 g by mouth daily as  needed. 10/20/24   Vann, Jessica U, DO  rosuvastatin  (CRESTOR ) 40 MG tablet Take 1 tablet (40 mg total) by mouth daily. 10/20/24   Juvenal Harlene PENNER, DO  senna-docusate (SENOKOT-S) 8.6-50 MG tablet Take 2 tablets by mouth at bedtime. 08/21/24 08/21/25  Pearlean Manus, MD    Allergies  Allergen Reactions   Tetanus Toxoid-Containing Vaccines Other (See Comments)    Childhood Allergy    Zetia  [Ezetimibe ] Diarrhea    REVIEW OF SYSTEMS:  General: no fevers/chills/night sweats Eyes: no blurry vision, diplopia, or amaurosis ENT: no sore throat or hearing loss Resp: no cough, wheezing, or hemoptysis CV: no edema or palpitations GI: no abdominal pain, nausea, vomiting, diarrhea, or constipation GU: no dysuria, frequency, or hematuria Skin: no rash Neuro: no headache, numbness, tingling, or weakness of extremities Musculoskeletal: no joint pain or swelling Heme: no bleeding, DVT, or easy bruising Endo: no polydipsia or polyuria  BP 130/70   Pulse 84   Ht 5' 3 (1.6 m)   Wt 158 lb 6.4 oz (71.8 kg)   SpO2 97%   BMI 28.06 kg/m   PHYSICAL EXAM: GEN:  AO x 3 in no acute distress HEENT: normal Dentition: Normal Neck: JVP normal. +2 carotid upstrokes without bruits. No thyromegaly. Lungs: equal expansion, clear bilaterally CV: Apex is discrete and nondisplaced, RRR with 2/6 SEM Abd: soft, non-tender, non-distended; no bruit; positive bowel sounds Ext: no edema, ecchymoses, or cyanosis Vascular: 2+ femoral pulses, 2+ radial pulses       Skin: warm and dry without rash Neuro: CN II-XII grossly intact; motor and sensory grossly intact    DATA AND STUDIES:  EKG: 2025 V paced  EKG Interpretation Date/Time:    Ventricular Rate:  PR Interval:    QRS Duration:    QT Interval:    QTC Calculation:   R Axis:      Text Interpretation:          CARDIAC STUDIES: Refer to CV Procedures and Imaging Tabs  06/12/2024: B Natriuretic Peptide 169.0; TSH 0.937 10/09/2024: Magnesium   2.2 10/19/2024: ALT 23; BUN 26; Creatinine, Ser 0.92; Hemoglobin 8.7; Platelets 183; Potassium 4.0; Sodium 139      ASSESSMENT AND PLAN:   1. S/P AVR (aortic valve replacement)   2. S/P TVR (tricuspid valve replacement)   3. S/P CABG x 2   4. Nonrheumatic mitral valve regurgitation   5. PAF (paroxysmal atrial fibrillation) (HCC)   6. Atypical atrial flutter (HCC)   7. Secondary hypercoagulable state   8. Pacemaker   9. Primary hypertension   10. Hyperlipidemia LDL goal <55   11. Aortic atherosclerosis   12. BMI 27.0-27.9,adult     Status post AVR: Complicated by endocarditis requiring reoperation.  Most recent echocardiogram looked to be reassuring.  Continue Eliquis  5 mg twice daily.  Refer to cardiac rehabilitation. Status post TVR: Due to endocarditis.  Last echocardiogram demonstrated good function.  RV function was somewhat down.  Monitor for now. Status post CABG x 2: Continue Eliquis  5 mg twice daily, rosuvastatin  40 mg daily Mitral regurgitation: Moderate on most recent echocardiogram.  Monitor for now.   PAF: Continue amiodarone  200 mg, Eliquis  5 mg twice daily.  Patient followed by EP. Atypical atrial flutter: Continue Eliquis  5 mg twice daily Secondary hypercoagulable state: Continue Eliquis  5 mg twice daily Status post pacemaker: Followed by EP  Hypertension: Continue amlodipine  5 mg.  BP well-controlled. Hyperlipidemia: LDL was 66 4 months ago.  Continue simvastatin 40 mg Aortic atherosclerosis: Continue Eliquis  5 mg twice daily, rosuvastatin  40 mg Elevated BMI: Continue diet and exercise modification   I have personally reviewed the patients imaging data as summarized above.  I have reviewed the natural history of aortic stenosis with the patient and family members who are present today. We have discussed the limitations of medical therapy and the poor prognosis associated with symptomatic aortic stenosis. We have also reviewed potential treatment options, including  palliative medical therapy, conventional surgical aortic valve replacement, and transcatheter aortic valve replacement. We discussed treatment options in the context of this patient's specific comorbid medical conditions.   All of the patient's questions were answered today. Will make further recommendations based on the results of studies outlined above.   I spent 35 minutes reviewing all clinical data during and prior to this visit including all relevant imaging studies, laboratories, clinical information from other health systems and prior notes from both Cardiology and other specialties, interviewing the patient, conducting a complete physical examination, and coordinating care in order to formulate a comprehensive and personalized evaluation and treatment plan.   Haruki Arnold K Yury Schaus, MD  11/08/2024 10:20 AM    [facility] [address] [place], [place], [place]  [address] Phone: [phone]; Fax: [phone]

## 2024-11-08 ENCOUNTER — Ambulatory Visit: Attending: Internal Medicine | Admitting: Internal Medicine

## 2024-11-08 ENCOUNTER — Encounter: Payer: Self-pay | Admitting: Internal Medicine

## 2024-11-08 VITALS — BP 130/70 | HR 84 | Ht 63.0 in | Wt 158.4 lb

## 2024-11-08 DIAGNOSIS — I484 Atypical atrial flutter: Secondary | ICD-10-CM

## 2024-11-08 DIAGNOSIS — I480 Paroxysmal atrial fibrillation: Secondary | ICD-10-CM

## 2024-11-08 DIAGNOSIS — I10 Essential (primary) hypertension: Secondary | ICD-10-CM

## 2024-11-08 DIAGNOSIS — Z950 Presence of cardiac pacemaker: Secondary | ICD-10-CM

## 2024-11-08 DIAGNOSIS — I340 Nonrheumatic mitral (valve) insufficiency: Secondary | ICD-10-CM | POA: Diagnosis not present

## 2024-11-08 DIAGNOSIS — Z952 Presence of prosthetic heart valve: Secondary | ICD-10-CM | POA: Diagnosis not present

## 2024-11-08 DIAGNOSIS — Z951 Presence of aortocoronary bypass graft: Secondary | ICD-10-CM

## 2024-11-08 DIAGNOSIS — Z954 Presence of other heart-valve replacement: Secondary | ICD-10-CM

## 2024-11-08 DIAGNOSIS — D6869 Other thrombophilia: Secondary | ICD-10-CM

## 2024-11-08 DIAGNOSIS — I700 Atherosclerosis of aorta: Secondary | ICD-10-CM

## 2024-11-08 DIAGNOSIS — E785 Hyperlipidemia, unspecified: Secondary | ICD-10-CM

## 2024-11-08 DIAGNOSIS — Z6827 Body mass index (BMI) 27.0-27.9, adult: Secondary | ICD-10-CM

## 2024-11-08 NOTE — Patient Instructions (Signed)
 Medication Instructions:  Your physician recommends that you continue on your current medications as directed. Please refer to the Current Medication list given to you today.  *If you need a refill on your cardiac medications before your next appointment, please call your pharmacy*  Lab Work: None ordered If you have labs (blood work) drawn today and your tests are completely normal, you will receive your results only by: MyChart Message (if you have MyChart) OR A paper copy in the mail If you have any lab test that is abnormal or we need to change your treatment, we will call you to review the results.  Testing/Procedures: None ordered  Follow-Up: At [facility], you and your health needs are our priority.  As part of our continuing mission to provide you with exceptional heart care, our providers are all part of one team.  This team includes your primary Cardiologist (physician) and Advanced Practice Providers or APPs (Physician Assistants and Nurse Practitioners) who all work together to provide you with the care you need, when you need it.  Your next appointment:   6 month(s)  Provider:   Glendia Ferrier, PA-C          We recommend signing up for the patient portal called MyChart.  Sign up information is provided on this After Visit Summary.  MyChart is used to connect with patients for Virtual Visits (Telemedicine).  Patients are able to view lab/test results, encounter notes, upcoming appointments, etc.  Non-urgent messages can be sent to your provider as well.   To learn more about what you can do with MyChart, go to [email or web address].   Other Instructions We are referring you Outpatient Cardiac Rehab at [facility].

## 2024-11-11 ENCOUNTER — Encounter (HOSPITAL_COMMUNITY): Attending: Internal Medicine

## 2024-11-18 ENCOUNTER — Ambulatory Visit

## 2024-11-18 DIAGNOSIS — I480 Paroxysmal atrial fibrillation: Secondary | ICD-10-CM

## 2024-11-19 LAB — CUP PACEART REMOTE DEVICE CHECK
Battery Remaining Longevity: 102 mo
Battery Remaining Percentage: 95.5 %
Battery Voltage: 3.01 V
Brady Statistic AP VP Percent: 1 %
Brady Statistic AP VS Percent: 0 %
Brady Statistic AS VP Percent: 99 %
Brady Statistic AS VS Percent: 1 %
Brady Statistic RA Percent Paced: 1 %
Date Time Interrogation Session: 20251201020011
Implantable Lead Connection Status: 753985
Implantable Lead Connection Status: 753985
Implantable Lead Implant Date: 20250714
Implantable Lead Implant Date: 20250714
Implantable Lead Location: 753858
Implantable Lead Location: 753859
Implantable Pulse Generator Implant Date: 20250714
Lead Channel Impedance Value: 3000 Ohm
Lead Channel Impedance Value: 360 Ohm
Lead Channel Impedance Value: 680 Ohm
Lead Channel Pacing Threshold Amplitude: 0.5 V
Lead Channel Pacing Threshold Amplitude: 0.75 V
Lead Channel Pacing Threshold Pulse Width: 0.5 ms
Lead Channel Pacing Threshold Pulse Width: 0.5 ms
Lead Channel Sensing Intrinsic Amplitude: 5 mV
Lead Channel Setting Pacing Amplitude: 0.25 V
Lead Channel Setting Pacing Amplitude: 2 V
Lead Channel Setting Pacing Amplitude: 2.5 V
Lead Channel Setting Pacing Pulse Width: 0.1 ms
Lead Channel Setting Pacing Pulse Width: 0.5 ms
Lead Channel Setting Sensing Sensitivity: 4 mV
Pulse Gen Model: 3562
Pulse Gen Serial Number: 8291881

## 2024-11-20 ENCOUNTER — Encounter (HOSPITAL_COMMUNITY)
Admission: RE | Admit: 2024-11-20 | Discharge: 2024-11-20 | Disposition: A | Source: Ambulatory Visit | Attending: Internal Medicine

## 2024-11-20 VITALS — BP 110/60 | HR 78 | Ht 63.0 in | Wt 163.1 lb

## 2024-11-20 DIAGNOSIS — Z952 Presence of prosthetic heart valve: Secondary | ICD-10-CM | POA: Insufficient documentation

## 2024-11-20 DIAGNOSIS — Z954 Presence of other heart-valve replacement: Secondary | ICD-10-CM | POA: Insufficient documentation

## 2024-11-20 DIAGNOSIS — Z951 Presence of aortocoronary bypass graft: Secondary | ICD-10-CM | POA: Insufficient documentation

## 2024-11-20 NOTE — Patient Instructions (Signed)
 Patient Instructions  Patient Details  Name: Sameul Tagle MRN: [id number] Date of Birth: 28-Dec-1945 Referring Provider:  Thukkani, Arun K, MD  Below are your personal goals for exercise, nutrition, and risk factors. Our goal is to help you stay on track towards obtaining and maintaining these goals. We will be discussing your progress on these goals with you throughout the program.  Initial Exercise Prescription:  Initial Exercise Prescription - 11/20/24 1400       Date of Initial Exercise RX and Referring Provider   Date 11/20/24    Referring Provider Wendel Haws MD      Treadmill   MPH 1    Grade 0    Minutes 15    METs 1.77      NuStep   Level 1    SPM 50    Minutes 15    METs 1.8      Prescription Details   Frequency (times per week) 2    Duration Progress to 30 minutes of continuous aerobic without signs/symptoms of physical distress      Intensity   THRR 40-80% of Max Heartrate 102-130    Ratings of Perceived Exertion 11-13    Perceived Dyspnea 0-4      Resistance Training   Training Prescription Yes    Weight 3    Reps 10-15          Exercise Goals: Frequency: Be able to perform aerobic exercise two to three times per week in program working toward 2-5 days per week of home exercise.  Intensity: Work with a perceived exertion of 11 (fairly light) - 15 (hard) while following your exercise prescription.  We will make changes to your prescription with you as you progress through the program.   Duration: Be able to do 30 to 45 minutes of continuous aerobic exercise in addition to a 5 minute warm-up and a 5 minute cool-down routine.   Nutrition Goals: Your personal nutrition goals will be established when you do your nutrition analysis with the dietician.  The following are general nutrition guidelines to follow: Cholesterol < 200mg /day Sodium < 1500mg /day Fiber: Men over 50 yrs - 30 grams per day  Personal Goals:  Personal Goals and Risk Factors at  Admission - 11/20/24 1352       Core Components/Risk Factors/Patient Goals on Admission    Weight Management Weight Maintenance    Heart Failure Yes    Intervention Provide a combined exercise and nutrition program that is supplemented with education, support and counseling about heart failure. Directed toward relieving symptoms such as shortness of breath, decreased exercise tolerance, and extremity edema.    Expected Outcomes Improve functional capacity of life;Short term: Attendance in program 2-3 days a week with increased exercise capacity. Reported lower sodium intake. Reported increased fruit and vegetable intake. Reports medication compliance.;Short term: Daily weights obtained and reported for increase. Utilizing diuretic protocols set by physician.;Long term: Adoption of self-care skills and reduction of barriers for early signs and symptoms recognition and intervention leading to self-care maintenance.    Hypertension Yes    Intervention Provide education on lifestyle modifcations including regular physical activity/exercise, weight management, moderate sodium restriction and increased consumption of fresh fruit, vegetables, and low fat dairy, alcohol moderation, and smoking cessation.;Monitor prescription use compliance.    Expected Outcomes Short Term: Continued assessment and intervention until BP is < 140/34mm HG in hypertensive participants. < 130/63mm HG in hypertensive participants with diabetes, heart failure or chronic kidney disease.;Long Term: Maintenance of blood  pressure at goal levels.    Lipids Yes    Intervention Provide education and support for participant on nutrition & aerobic/resistive exercise along with prescribed medications to achieve LDL 70mg , HDL >40mg .    Expected Outcomes Short Term: Participant states understanding of desired cholesterol values and is compliant with medications prescribed. Participant is following exercise prescription and nutrition  guidelines.;Long Term: Cholesterol controlled with medications as prescribed, with individualized exercise RX and with personalized nutrition plan. Value goals: LDL < 70mg , HDL > 40 mg.          Tobacco Use Initial Evaluation: Social History   Tobacco Use  Smoking Status Every Day   Current packs/day: 0.00   Average packs/day: 1.5 packs/day for 24.0 years (36.0 ttl pk-yrs)   Types: Cigarettes   Last attempt to quit: 08/21/2023   Years since quitting: 1.2   Passive exposure: Past  Smokeless Tobacco Former  Tobacco Comments   Former smoker 07/17/24    Exercise Goals and Review:  Exercise Goals     Row Name 11/20/24 1424             Exercise Goals   Increase Physical Activity Yes       Intervention Provide advice, education, support and counseling about physical activity/exercise needs.;Develop an individualized exercise prescription for aerobic and resistive training based on initial evaluation findings, risk stratification, comorbidities and participant's personal goals.       Expected Outcomes Short Term: Attend rehab on a regular basis to increase amount of physical activity.;Long Term: Add in home exercise to make exercise part of routine and to increase amount of physical activity.;Long Term: Exercising regularly at least 3-5 days a week.       Increase Strength and Stamina Yes       Intervention Provide advice, education, support and counseling about physical activity/exercise needs.;Develop an individualized exercise prescription for aerobic and resistive training based on initial evaluation findings, risk stratification, comorbidities and participant's personal goals.       Expected Outcomes Short Term: Increase workloads from initial exercise prescription for resistance, speed, and METs.;Short Term: Perform resistance training exercises routinely during rehab and add in resistance training at home;Long Term: Improve cardiorespiratory fitness, muscular endurance and strength  as measured by increased METs and functional capacity ( )       Able to understand and use rate of perceived exertion (RPE) scale Yes       Intervention Provide education and explanation on how to use RPE scale       Expected Outcomes Short Term: Able to use RPE daily in rehab to express subjective intensity level;Long Term:  Able to use RPE to guide intensity level when exercising independently       Able to understand and use Dyspnea scale Yes       Intervention Provide education and explanation on how to use Dyspnea scale       Expected Outcomes Short Term: Able to use Dyspnea scale daily in rehab to express subjective sense of shortness of breath during exertion;Long Term: Able to use Dyspnea scale to guide intensity level when exercising independently       Knowledge and understanding of Target Heart Rate Range (THRR) Yes       Intervention Provide education and explanation of THRR including how the numbers were predicted and where they are located for reference       Expected Outcomes Short Term: Able to state/look up THRR;Long Term: Able to use THRR to govern intensity when exercising  independently;Short Term: Able to use daily as guideline for intensity in rehab       Able to check pulse independently Yes       Intervention Provide education and demonstration on how to check pulse in carotid and radial arteries.;Review the importance of being able to check your own pulse for safety during independent exercise       Expected Outcomes Short Term: Able to explain why pulse checking is important during independent exercise;Long Term: Able to check pulse independently and accurately       Understanding of Exercise Prescription Yes       Intervention Provide education, explanation, and written materials on patient's individual exercise prescription       Expected Outcomes Short Term: Able to explain program exercise prescription;Long Term: Able to explain home exercise prescription to exercise  independently          Copy of goals given to participant.

## 2024-11-20 NOTE — Progress Notes (Signed)
 Cardiac Individual Treatment Plan  Patient Details  Name: Kyle Matthews MRN: [id number] Date of Birth: 24-Sep-1946 Referring Provider:   Flowsheet Row CARDIAC REHAB PHASE II ORIENTATION from 11/20/2024 in [facility] CARDIAC REHABILITATION  Referring Provider Wendel Haws MD    Initial Encounter Date:  Flowsheet Row CARDIAC REHAB PHASE II ORIENTATION from 11/20/2024 in [facility] CARDIAC REHABILITATION  Date 11/20/24    Visit Diagnosis: S/P CABG x 2  S/P tricuspid valve replacement  Patient's Home Medications on Admission:  Current Outpatient Medications:    acetaminophen  (TYLENOL ) 325 MG tablet, Take 2 tablets (650 mg total) by mouth every 6 (six) hours as needed for mild pain (pain score 1-3) or fever (or Fever >/= 101)., Disp: , Rfl:    amiodarone  (PACERONE ) 200 MG tablet, Take 1 tablet (200 mg total) by mouth daily., Disp: 90 tablet, Rfl: 1   amLODipine  (NORVASC ) 5 MG tablet, 1 tablet Orally Once a day; Duration: 30 days, Disp: , Rfl:    apixaban  (ELIQUIS ) 5 MG TABS tablet, Take 1 tablet (5 mg total) by mouth 2 (two) times daily., Disp: 60 tablet, Rfl: 6   diphenhydrAMINE  (BENADRYL ) 25 MG tablet, Take 25 mg by mouth every 6 (six) hours as needed for itching., Disp: , Rfl:    diphenhydrAMINE -zinc  acetate (BENADRYL ) cream, Apply 1 Application topically 3 (three) times daily as needed for itching., Disp: , Rfl:    finasteride  (PROSCAR ) 5 MG tablet, Take 5 mg by mouth every evening., Disp: , Rfl:    nitroGLYCERIN  (NITROSTAT ) 0.4 MG SL tablet, Place 1 tablet (0.4 mg total) under the tongue every 5 (five) minutes as needed for chest pain., Disp: 25 tablet, Rfl: 3   ondansetron  (ZOFRAN -ODT) 4 MG disintegrating tablet, Take 1 tablet (4 mg total) by mouth every 8 (eight) hours as needed for nausea or vomiting., Disp: 20 tablet, Rfl: 0   oxyCODONE  (OXY IR/ROXICODONE ) 5 MG immediate release tablet, Take 1 tablet (5 mg total) by mouth every 4 (four) hours as needed for moderate pain (pain score  4-6)., Disp: 30 tablet, Rfl: 0   polyethylene glycol (MIRALAX  / GLYCOLAX ) 17 g packet, Take 17 g by mouth daily as needed., Disp: , Rfl:    rosuvastatin  (CRESTOR ) 40 MG tablet, Take 1 tablet (40 mg total) by mouth daily., Disp: 30 tablet, Rfl: 3   senna-docusate (SENOKOT-S) 8.6-50 MG tablet, Take 2 tablets by mouth at bedtime., Disp: 60 tablet, Rfl: 1   midodrine  (PROAMATINE ) 5 MG tablet, Take 1 tablet (5 mg total) by mouth 3 (three) times daily with meals. (Patient not taking: Reported on 11/20/2024), Disp: 30 tablet, Rfl: 0  Past Medical History: Past Medical History:  Diagnosis Date   Anemia    Anxiety    Arthritis    BPH (benign prostatic hyperplasia)    Status post biopsy in 2009. - w/ LUTS   Coronary artery disease    a. 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   Severe aortic stenosis 04/13/2017   Stroke (  HCC) 2007   No true etiology noted   UTI (urinary tract infection)     Tobacco Use: Social History   Tobacco Use  Smoking Status Every Day   Current packs/day: 0.00   Average packs/day: 1.5 packs/day for 24.0 years (36.0 ttl pk-yrs)   Types: Cigarettes   Last attempt to quit: 08/21/2023   Years since quitting: 1.2   Passive exposure: Past  Smokeless Tobacco Former  Tobacco Comments   Former smoker 07/17/24    Labs: Review Flowsheet  More data exists      Latest Ref Rng & Units 06/26/2024 06/27/2024 06/28/2024 06/29/2024 06/30/2024  Labs for ITP Cardiac and Pulmonary Rehab  PH, Arterial  7.35 - 7.45 7.341  7.305  7.279  7.372  7.400  7.350  7.356  7.413  7.443  7.436  7.343  7.361  7.366  7.327  - - -  PCO2 arterial 32 - 48 mmHg 45.4  51.8  55.7  42.0  42.6  44.0  40.0  36.0  31.2  45.9  37.0  40.4  31.7  36.5  - - -  Bicarbonate 20.0 - 28.0 mmol/L 24.8  25.8  26.1  24.4  26.4  24.3  22.4  22.9  21.3  25.1  30.9  20.1  22.9  18.3  19.1  - - -  TCO2 22 - 32 mmol/L 26  27  25  28  25  26  26  28  24  26  24  25  24  24  22  23  26   32  26  21  23  24  19  20   - - -  Acid-base deficit 0.0 - 2.0 mmol/L 1.0  1.0  1.0  1.0  3.0  2.0  3.0  5.0  2.0  7.0  6.0  - - -  O2 Saturation % 97  100  92  100  100  100  100  100  100  88  100  100  92  91  98  57.7  77  77     Details       Multiple values from one day are sorted in reverse-chronological order          Exercise Target Goals: Exercise Program Goal: Individual exercise prescription set using results from initial 6 min walk test and THRR while considering  patient's activity barriers and safety.   Exercise Prescription Goal: Initial exercise prescription builds to 30-45 minutes a day of aerobic activity, 2-3 days per week.  Home exercise guidelines will be given to patient during program as part of exercise prescription that the participant will acknowledge.   Education: Aerobic Exercise: - Group verbal and visual presentation on the components of exercise prescription. Introduces F.I.T.T principle from ACSM for exercise prescriptions.  Reviews F.I.T.T. principles of aerobic exercise including progression. Written material provided at class time.   Education: Resistance Exercise: - Group verbal and visual presentation on the components of exercise prescription. Introduces F.I.T.T principle from ACSM for exercise prescriptions  Reviews F.I.T.T. principles of resistance exercise including progression. Written material provided at class time.    Education: Exercise & Equipment Safety: - Individual verbal instruction and  demonstration of equipment use and safety with use of the equipment.   Education: Exercise Physiology & General Exercise Guidelines: - Group verbal and written instruction with models to review the exercise physiology of the cardiovascular system and associated critical values. Provides general exercise guidelines with specific guidelines  to those with heart or lung disease. Written material provided at class time.   Education: Flexibility, Balance, Mind/Body Relaxation: - Group verbal and visual presentation with interactive activity on the components of exercise prescription. Introduces F.I.T.T principle from ACSM for exercise prescriptions. Reviews F.I.T.T. principles of flexibility and balance exercise training including progression. Also discusses the mind body connection.  Reviews various relaxation techniques to help reduce and manage stress (i.e. Deep breathing, progressive muscle relaxation, and visualization). Balance handout provided to take home. Written material provided at class time.   Activity Barriers & Risk Stratification:  Activity Barriers & Cardiac Risk Stratification - 11/20/24 1327       Activity Barriers & Cardiac Risk Stratification   Activity Barriers Deconditioning    Cardiac Risk Stratification High          6 Minute Walk:  6 Minute Walk     Row Name 11/20/24 1421         6 Minute Walk   Phase Initial     Distance 1250 feet     Walk Time 6 minutes     # of Rest Breaks 0     MPH 2.36     METS 2.33     RPE 14     Perceived Dyspnea  0     VO2 Peak 8.17     Symptoms No     Resting HR 74 bpm     Resting BP 110/62     Resting Oxygen Saturation  98 %     Exercise Oxygen Saturation  during 6 min walk 98 %     Max Ex. HR 109 bpm     Max Ex. BP 120/60     2 Minute Post BP 112/60        Oxygen Initial Assessment:   Oxygen Re-Evaluation:   Oxygen Discharge (Final Oxygen Re-Evaluation):   Initial Exercise Prescription:  Initial Exercise  Prescription - 11/20/24 1400       Date of Initial Exercise RX and Referring Provider   Date 11/20/24    Referring Provider Wendel Haws MD      Treadmill   MPH 1    Grade 0    Minutes 15    METs 1.77      NuStep   Level 1    SPM 50    Minutes 15    METs 1.8      Prescription Details   Frequency (times per week) 2    Duration Progress to 30 minutes of continuous aerobic without signs/symptoms of physical distress      Intensity   THRR 40-80% of Max Heartrate 102-130    Ratings of Perceived Exertion 11-13    Perceived Dyspnea 0-4      Resistance Training   Training Prescription Yes    Weight 3    Reps 10-15          Perform Capillary Blood Glucose checks as needed.  Exercise Prescription Changes:   Exercise Prescription Changes     Row Name 11/20/24 1400             Response to Exercise   Blood Pressure (Admit) 110/62       Blood Pressure (Exercise) 120/60       Blood Pressure (Exit) 112/60       Heart Rate (Admit) 74 bpm       Heart Rate (Exercise) 109 bpm       Heart Rate (Exit) 80 bpm  Oxygen Saturation (Admit) 98 %       Oxygen Saturation (Exercise) 98 %       Oxygen Saturation (Exit) 96 %       Rating of Perceived Exertion (Exercise) 14       Perceived Dyspnea (Exercise) 0          Exercise Comments:   Exercise Goals and Review:   Exercise Goals     Row Name 11/20/24 1424             Exercise Goals   Increase Physical Activity Yes       Intervention Provide advice, education, support and counseling about physical activity/exercise needs.;Develop an individualized exercise prescription for aerobic and resistive training based on initial evaluation findings, risk stratification, comorbidities and participant's personal goals.       Expected Outcomes Short Term: Attend rehab on a regular basis to increase amount of physical activity.;Long Term: Add in home exercise to make exercise part of routine and to increase amount of  physical activity.;Long Term: Exercising regularly at least 3-5 days a week.       Increase Strength and Stamina Yes       Intervention Provide advice, education, support and counseling about physical activity/exercise needs.;Develop an individualized exercise prescription for aerobic and resistive training based on initial evaluation findings, risk stratification, comorbidities and participant's personal goals.       Expected Outcomes Short Term: Increase workloads from initial exercise prescription for resistance, speed, and METs.;Short Term: Perform resistance training exercises routinely during rehab and add in resistance training at home;Long Term: Improve cardiorespiratory fitness, muscular endurance and strength as measured by increased METs and functional capacity ( )       Able to understand and use rate of perceived exertion (RPE) scale Yes       Intervention Provide education and explanation on how to use RPE scale       Expected Outcomes Short Term: Able to use RPE daily in rehab to express subjective intensity level;Long Term:  Able to use RPE to guide intensity level when exercising independently       Able to understand and use Dyspnea scale Yes       Intervention Provide education and explanation on how to use Dyspnea scale       Expected Outcomes Short Term: Able to use Dyspnea scale daily in rehab to express subjective sense of shortness of breath during exertion;Long Term: Able to use Dyspnea scale to guide intensity level when exercising independently       Knowledge and understanding of Target Heart Rate Range (THRR) Yes       Intervention Provide education and explanation of THRR including how the numbers were predicted and where they are located for reference       Expected Outcomes Short Term: Able to state/look up THRR;Long Term: Able to use THRR to govern intensity when exercising independently;Short Term: Able to use daily as guideline for intensity in rehab       Able to  check pulse independently Yes       Intervention Provide education and demonstration on how to check pulse in carotid and radial arteries.;Review the importance of being able to check your own pulse for safety during independent exercise       Expected Outcomes Short Term: Able to explain why pulse checking is important during independent exercise;Long Term: Able to check pulse independently and accurately       Understanding of Exercise Prescription Yes  Intervention Provide education, explanation, and written materials on patient's individual exercise prescription       Expected Outcomes Short Term: Able to explain program exercise prescription;Long Term: Able to explain home exercise prescription to exercise independently          Exercise Goals Re-Evaluation :   Discharge Exercise Prescription (Final Exercise Prescription Changes):  Exercise Prescription Changes - 11/20/24 1400       Response to Exercise   Blood Pressure (Admit) 110/62    Blood Pressure (Exercise) 120/60    Blood Pressure (Exit) 112/60    Heart Rate (Admit) 74 bpm    Heart Rate (Exercise) 109 bpm    Heart Rate (Exit) 80 bpm    Oxygen Saturation (Admit) 98 %    Oxygen Saturation (Exercise) 98 %    Oxygen Saturation (Exit) 96 %    Rating of Perceived Exertion (Exercise) 14    Perceived Dyspnea (Exercise) 0          Nutrition:  Target Goals: Understanding of nutrition guidelines, daily intake of sodium 1500mg , cholesterol 200mg , calories 30% from fat and 7% or less from saturated fats, daily to have 5 or more servings of fruits and vegetables.  Education: Nutrition 1 -Group instruction provided by verbal, written material, interactive activities, discussions, models, and posters to present general guidelines for heart healthy nutrition including macronutrients, label reading, and promoting whole foods over processed counterparts. Education serves as pensions consultant of discussion of heart healthy eating for  all. Written material provided at class time.    Education: Nutrition 2 -Group instruction provided by verbal, written material, interactive activities, discussions, models, and posters to present general guidelines for heart healthy nutrition including sodium, cholesterol, and saturated fat. Providing guidance of habit forming to improve blood pressure, cholesterol, and body weight. Written material provided at class time.     Biometrics:  Pre Biometrics - 11/20/24 1424       Pre Biometrics   Height 5' 3 (1.6 m)    Weight 74 kg    Waist Circumference 38 inches    Hip Circumference 41 inches    Waist to Hip Ratio 0.93 %    BMI (Calculated) 28.91    Grip Strength 22.1 kg    Single Leg Stand 5.46 seconds           Nutrition Therapy Plan and Nutrition Goals:   Nutrition Assessments:  MEDIFICTS Score Key: >=70 Need to make dietary changes  40-70 Heart Healthy Diet <= 40 Therapeutic Level Cholesterol Diet  Flowsheet Row CARDIAC REHAB PHASE II ORIENTATION from 02/12/2024 in [facility] CARDIAC REHABILITATION  Picture Your Plate Total Score on Admission 54   Picture Your Plate Scores: <59 Unhealthy dietary pattern with much room for improvement. 41-50 Dietary pattern unlikely to meet recommendations for good health and room for improvement. 51-60 More healthful dietary pattern, with some room for improvement.  >60 Healthy dietary pattern, although there may be some specific behaviors that could be improved.    Nutrition Goals Re-Evaluation:   Nutrition Goals Discharge (Final Nutrition Goals Re-Evaluation):   Psychosocial: Target Goals: Acknowledge presence or absence of significant depression and/or stress, maximize coping skills, provide positive support system. Participant is able to verbalize types and ability to use techniques and skills needed for reducing stress and depression.   Education: Stress, Anxiety, and Depression - Group verbal and visual presentation  to define topics covered.  Reviews how body is impacted by stress, anxiety, and depression.  Also discusses healthy ways to reduce  stress and to treat/manage anxiety and depression. Written material provided at class time.   Education: Sleep Hygiene -Provides group verbal and written instruction about how sleep can affect your health.  Define sleep hygiene, discuss sleep cycles and impact of sleep habits. Review good sleep hygiene tips.   Initial Review & Psychosocial Screening:  Initial Psych Review & Screening - 11/20/24 1353       Initial Review   Current issues with None Identified      Family Dynamics   Good Support System? Yes    Comments Patients wife and daugher support him.      Barriers   Psychosocial barriers to participate in program There are no identifiable barriers or psychosocial needs.;The patient should benefit from training in stress management and relaxation.      Screening Interventions   Interventions Encouraged to exercise;To provide support and resources with identified psychosocial needs;Provide feedback about the scores to participant    Expected Outcomes Long Term Goal: Stressors or current issues are controlled or eliminated.;Short Term goal: Identification and review with participant of any Quality of Life or Depression concerns found by scoring the questionnaire.;Long Term goal: The participant improves quality of Life and PHQ9 Scores as seen by post scores and/or verbalization of changes          Quality of Life Scores:   Quality of Life - 11/20/24 1425       Quality of Life   Select Quality of Life      Quality of Life Scores   Health/Function Pre 11.23 %    Socioeconomic Pre 28.29 %    Psych/Spiritual Pre 22.79 %    Family Pre 27.6 %    GLOBAL Pre 19.53 %         Scores of 19 and below usually indicate a poorer quality of life in these areas.  A difference of  2-3 points is a clinically meaningful difference.  A difference of 2-3 points  in the total score of the Quality of Life Index has been associated with significant improvement in overall quality of life, self-image, physical symptoms, and general health in studies assessing change in quality of life.  PHQ-9: Review Flowsheet       11/20/2024 02/12/2024  Depression screen PHQ 2/9  Decreased Interest 0 1  Down, Depressed, Hopeless 0 2  PHQ - 2 Score 0 3  Altered sleeping 1 2  Tired, decreased energy 3 3  Change in appetite 0 0  Feeling bad or failure about yourself  0 2  Trouble concentrating 0 1  Moving slowly or fidgety/restless 0 0  Suicidal thoughts 0 0  PHQ-9 Score 4 11   Difficult doing work/chores Somewhat difficult Somewhat difficult    Details       Data saved with a previous flowsheet row definition        Interpretation of Total Score  Total Score Depression Severity:  1-4 = Minimal depression, 5-9 = Mild depression, 10-14 = Moderate depression, 15-19 = Moderately severe depression, 20-27 = Severe depression   Psychosocial Evaluation and Intervention:  Psychosocial Evaluation - 11/20/24 1356       Psychosocial Evaluation & Interventions   Interventions Stress management education;Relaxation education;Encouraged to exercise with the program and follow exercise prescription    Comments Patient was referred to cardiac rehab with S/P CABGx1 and Tricuspid valve replacement. He started the program in February of this year with AV replacement but had an indwelling urinay catheter and was not able to  exercise. He has since had his prostate removed. He denies any depression or anxiety or stressors. He says he sleeps well. He is not very active and his main goal is to get into an exercise routine and improve his fatigue and energy levels. He has no barriers identified to complete the program.    Expected Outcomes Short Term: Patient will start the program and attend consistently. Long Term: Patient will complete the program meeting personal goals.     Continue Psychosocial Services  Follow up required by staff          Psychosocial Re-Evaluation:   Psychosocial Discharge (Final Psychosocial Re-Evaluation):   Vocational Rehabilitation: Provide vocational rehab assistance to qualifying candidates.   Vocational Rehab Evaluation & Intervention:   Education: Education Goals: Education classes will be provided on a variety of topics geared toward better understanding of heart health and risk factor modification. Participant will state understanding/return demonstration of topics presented as noted by education test scores.  Learning Barriers/Preferences:  Learning Barriers/Preferences - 11/20/24 1352       Learning Barriers/Preferences   Learning Barriers None    Learning Preferences Written Material;Verbal Instruction;Skilled Demonstration;Audio          General Cardiac Education Topics:  AED/CPR: - Group verbal and written instruction with the use of models to demonstrate the basic use of the AED with the basic ABC's of resuscitation.   Test and Procedures: - Group verbal and visual presentation and models provide information about basic cardiac anatomy and function. Reviews the testing methods done to diagnose heart disease and the outcomes of the test results. Describes the treatment choices: Medical Management, Angioplasty, or Coronary Bypass Surgery for treating various heart conditions including Myocardial Infarction, Angina, Valve Disease, and Cardiac Arrhythmias. Written material provided at class time.   Medication Safety: - Group verbal and visual instruction to review commonly prescribed medications for heart and lung disease. Reviews the medication, class of the drug, and side effects. Includes the steps to properly store meds and maintain the prescription regimen. Written material provided at class time.   Intimacy: - Group verbal instruction through game format to discuss how heart and lung disease can  affect sexual intimacy. Written material provided at class time.   Know Your Numbers and Heart Failure: - Group verbal and visual instruction to discuss disease risk factors for cardiac and pulmonary disease and treatment options.  Reviews associated critical values for Overweight/Obesity, Hypertension, Cholesterol, and Diabetes.  Discusses basics of heart failure: signs/symptoms and treatments.  Introduces Heart Failure Zone chart for action plan for heart failure. Written material provided at class time.   Infection Prevention: - Provides verbal and written material to individual with discussion of infection control including proper hand washing and proper equipment cleaning during exercise session.   Falls Prevention: - Provides verbal and written material to individual with discussion of falls prevention and safety.   Other: -Provides group and verbal instruction on various topics (see comments)   Knowledge Questionnaire Score:   Core Components/Risk Factors/Patient Goals at Admission:  Personal Goals and Risk Factors at Admission - 11/20/24 1352       Core Components/Risk Factors/Patient Goals on Admission    Weight Management Weight Maintenance    Heart Failure Yes    Intervention Provide a combined exercise and nutrition program that is supplemented with education, support and counseling about heart failure. Directed toward relieving symptoms such as shortness of breath, decreased exercise tolerance, and extremity edema.    Expected Outcomes Improve functional  capacity of life;Short term: Attendance in program 2-3 days a week with increased exercise capacity. Reported lower sodium intake. Reported increased fruit and vegetable intake. Reports medication compliance.;Short term: Daily weights obtained and reported for increase. Utilizing diuretic protocols set by physician.;Long term: Adoption of self-care skills and reduction of barriers for early signs and symptoms recognition  and intervention leading to self-care maintenance.    Hypertension Yes    Intervention Provide education on lifestyle modifcations including regular physical activity/exercise, weight management, moderate sodium restriction and increased consumption of fresh fruit, vegetables, and low fat dairy, alcohol moderation, and smoking cessation.;Monitor prescription use compliance.    Expected Outcomes Short Term: Continued assessment and intervention until BP is < 140/60mm HG in hypertensive participants. < 130/63mm HG in hypertensive participants with diabetes, heart failure or chronic kidney disease.;Long Term: Maintenance of blood pressure at goal levels.    Lipids Yes    Intervention Provide education and support for participant on nutrition & aerobic/resistive exercise along with prescribed medications to achieve LDL 70mg , HDL >40mg .    Expected Outcomes Short Term: Participant states understanding of desired cholesterol values and is compliant with medications prescribed. Participant is following exercise prescription and nutrition guidelines.;Long Term: Cholesterol controlled with medications as prescribed, with individualized exercise RX and with personalized nutrition plan. Value goals: LDL < 70mg , HDL > 40 mg.          Education:Diabetes - Individual verbal and written instruction to review signs/symptoms of diabetes, desired ranges of glucose level fasting, after meals and with exercise. Acknowledge that pre and post exercise glucose checks will be done for 3 sessions at entry of program.   Core Components/Risk Factors/Patient Goals Review:    Core Components/Risk Factors/Patient Goals at Discharge (Final Review):    ITP Comments:  ITP Comments     Row Name 11/20/24 1410           ITP Comments Patient arrived for 1st visit/orientation/education at 1300. Patient was referred to CR by Dr. Lurena Red due to Status Post CABGx1/Trisucpid valve replacement. During orientation advised  patient on arrival and appointment times what to wear, what to do before, during and after exercise. Reviewed attendance and class policy.  Pt is scheduled to return Cardiac Rehab on 11/25/24 at 1100. Pt was advised to come to class 15 minutes before class starts.  Discussed RPE/Dpysnea scales. Patient participated in warm up stretches. Patient was able to complete 6 minute walk test.  Telemetry:Ventricular paced rhythm. Patient was measured for the equipment. Discussed equipment safety with patient. Took patient pre-anthropometric measurements. Patient finished visit at 1410.          Comments: Patient arrived for 1st visit/orientation/education at 1300. Patient was referred to CR by Dr. Lurena Red due to Status Post CABGx1/Trisucpid valve replacement. During orientation advised patient on arrival and appointment times what to wear, what to do before, during and after exercise. Reviewed attendance and class policy.  Pt is scheduled to return Cardiac Rehab on 11/25/24 at 1100. Pt was advised to come to class 15 minutes before class starts.  Discussed RPE/Dpysnea scales. Patient participated in warm up stretches. Patient was able to complete 6 minute walk test.  Telemetry:Ventricular paced rhythm. Patient was measured for the equipment. Discussed equipment safety with patient. Took patient pre-anthropometric measurements. Patient finished visit at 1410.

## 2024-11-22 NOTE — Progress Notes (Signed)
 Remote PPM Transmission

## 2024-11-25 ENCOUNTER — Encounter (HOSPITAL_COMMUNITY)

## 2024-11-27 ENCOUNTER — Encounter (HOSPITAL_COMMUNITY)

## 2024-11-28 ENCOUNTER — Ambulatory Visit: Payer: Self-pay | Admitting: Cardiology

## 2024-12-02 ENCOUNTER — Encounter (HOSPITAL_COMMUNITY)
Admission: RE | Admit: 2024-12-02 | Discharge: 2024-12-02 | Disposition: A | Source: Ambulatory Visit | Attending: Internal Medicine

## 2024-12-02 DIAGNOSIS — Z951 Presence of aortocoronary bypass graft: Secondary | ICD-10-CM

## 2024-12-02 DIAGNOSIS — Z954 Presence of other heart-valve replacement: Secondary | ICD-10-CM

## 2024-12-02 DIAGNOSIS — Z952 Presence of prosthetic heart valve: Secondary | ICD-10-CM

## 2024-12-02 NOTE — Progress Notes (Signed)
 Daily Session Note  Patient Details  Name: Kyle Matthews MRN: [id number] Date of Birth: November 11, 1946 Referring Provider:   Flowsheet Row CARDIAC REHAB PHASE II ORIENTATION from 11/20/2024 in [facility] CARDIAC REHABILITATION  Referring Provider Wendel Haws MD    Encounter Date: 12/02/2024  Check In:  Session Check In - 12/02/24 1058       Check-In   Supervising physician immediately available to respond to emergencies See telemetry face sheet for immediately available MD    Location [facility]    Staff Present Laymon Rattler, BSN, RN, WTA-C;Heather Con, BS, Exercise Physiologist    Virtual Visit No    Medication changes reported     No    Fall or balance concerns reported    No    Tobacco Cessation No Change    Warm-up and Cool-down Performed on first and last piece of equipment    Resistance Training Performed Yes    VAD Patient? No    PAD/SET Patient? No      Pain Assessment   Currently in Pain? No/denies          Capillary Blood Glucose: No results found for this or any previous visit (from the past 24 hours).    Tobacco Use History[1]  Goals Met:  Independence with exercise equipment Exercise tolerated well No report of concerns or symptoms today Strength training completed today  Goals Unmet:  Not Applicable  Comments: First full day of exercise!  Patient was oriented to gym and equipment including functions, settings, policies, and procedures.  Patient's individual exercise prescription and treatment plan were reviewed.  All starting workloads were established based on the results of the 6 minute walk test done at initial orientation visit.  The plan for exercise progression was also introduced and progression will be customized based on patient's performance and goals.        [1]  Social History Tobacco Use  Smoking Status Every Day   Current packs/day: 0.00   Average packs/day: 1.5 packs/day for 24.0 years (36.0 ttl pk-yrs)    Types: Cigarettes   Last attempt to quit: 08/21/2023   Years since quitting: 1.2   Passive exposure: Past  Smokeless Tobacco Former  Tobacco Comments   Former smoker 07/17/24

## 2024-12-04 ENCOUNTER — Encounter (HOSPITAL_COMMUNITY): Admission: RE | Admit: 2024-12-04 | Discharge: 2024-12-04 | Attending: Internal Medicine

## 2024-12-04 DIAGNOSIS — Z952 Presence of prosthetic heart valve: Secondary | ICD-10-CM

## 2024-12-04 DIAGNOSIS — Z951 Presence of aortocoronary bypass graft: Secondary | ICD-10-CM

## 2024-12-04 DIAGNOSIS — Z954 Presence of other heart-valve replacement: Secondary | ICD-10-CM

## 2024-12-04 NOTE — Progress Notes (Signed)
 Daily Session Note  Patient Details  Name: Kyle Matthews MRN: [id number] Date of Birth: [birth year] Referring Provider:   Flowsheet Row CARDIAC REHAB PHASE II ORIENTATION from 11/20/2024 in [facility] CARDIAC REHABILITATION  Referring Provider Wendel Haws MD    Encounter Date: 12/04/2024  Check In:  Session Check In - 12/04/24 1028       Check-In   Supervising physician immediately available to respond to emergencies See telemetry face sheet for immediately available MD    Location [facility]    Staff Present Harlene Gelineau, MA, RCEP, CCRP, CCET;Hillary Allen BSN, RN;Joannah Gitlin [place], BSN, RN, [facility]    Virtual Visit No    Medication changes reported     No    Fall or balance concerns reported    No    Tobacco Cessation No Change    Warm-up and Cool-down Performed on first and last piece of equipment    Resistance Training Performed Yes    VAD Patient? No    PAD/SET Patient? No      Pain Assessment   Currently in Pain? No/denies          Capillary Blood Glucose: No results found for this or any previous visit (from the past 24 hours).    Tobacco Use History[1]  Goals Met:  Independence with exercise equipment Exercise tolerated well No report of concerns or symptoms today Strength training completed today  Goals Unmet:  Not Applicable  Comments: Pt able to follow exercise prescription today without complaint.  Will continue to monitor for progression.        [1]  Social History Tobacco Use  Smoking Status Every Day   Current packs/day: 0.00   Average packs/day: 1.5 packs/day for 24.0 years (36.0 ttl pk-yrs)   Types: Cigarettes   Last attempt to quit: 08/21/2023   Years since quitting: 1.2   Passive exposure: Past  Smokeless Tobacco Former  Tobacco Comments   Former smoker 07/17/24

## 2024-12-09 ENCOUNTER — Encounter (HOSPITAL_COMMUNITY)

## 2024-12-11 ENCOUNTER — Encounter (HOSPITAL_COMMUNITY)

## 2024-12-16 ENCOUNTER — Encounter (HOSPITAL_COMMUNITY)
Admission: RE | Admit: 2024-12-16 | Discharge: 2024-12-16 | Disposition: A | Source: Ambulatory Visit | Attending: Internal Medicine

## 2024-12-16 DIAGNOSIS — Z952 Presence of prosthetic heart valve: Secondary | ICD-10-CM

## 2024-12-16 DIAGNOSIS — Z951 Presence of aortocoronary bypass graft: Secondary | ICD-10-CM | POA: Diagnosis not present

## 2024-12-16 DIAGNOSIS — Z954 Presence of other heart-valve replacement: Secondary | ICD-10-CM

## 2024-12-16 NOTE — Progress Notes (Signed)
 Daily Session Note  Patient Details  Name: Kyle Matthews MRN: [id number] Date of Birth: May 29, 1946 Referring Provider:   Flowsheet Row CARDIAC REHAB PHASE II ORIENTATION from 11/20/2024 in [facility] CARDIAC RE[facility]ILITATION  Referring Provider Wendel Haws MD    Encounter Date: 12/16/2024  Check In:  Session Check In - 12/16/24 1135       Check-In   Supervising physician immediately available to respond to emergencies See telemetry face sheet for immediately available MD    Location [facility]    Staff Present Harlene Gelineau, MA, RCEP, CCRP, CCET;Brittany Jackquline, BSN, RN, WTA-C;Zniya Cottone Zina, RN    Virtual Visit No    Medication changes reported     No    Fall or balance concerns reported    No    Warm-up and Cool-down Performed on first and last piece of equipment    Resistance Training Performed Yes    VAD Patient? No    PAD/SET Patient? No      Pain Assessment   Currently in Pain? No/denies          Capillary Blood Glucose: No results found for this or any previous visit (from the past 24 hours).    Tobacco Use History[1]  Goals Met:  Independence with exercise equipment Exercise tolerated well No report of concerns or symptoms today Strength training completed today  Goals Unmet:  Not Applicable  Comments: Pt able to follow exercise prescription today without complaint.  Will continue to monitor for progression.        [1]  Social History Tobacco Use  Smoking Status Every Day   Current packs/day: 0.00   Average packs/day: 1.5 packs/day for 24.0 years (36.0 ttl pk-yrs)   Types: Cigarettes   Last attempt to quit: 08/21/2023   Years since quitting: 1.3   Passive exposure: Past  Smokeless Tobacco Former  Tobacco Comments   Former smoker 07/17/24

## 2024-12-17 ENCOUNTER — Encounter (HOSPITAL_COMMUNITY): Payer: Self-pay | Admitting: *Deleted

## 2024-12-17 DIAGNOSIS — Z951 Presence of aortocoronary bypass graft: Secondary | ICD-10-CM

## 2024-12-17 DIAGNOSIS — Z954 Presence of other heart-valve replacement: Secondary | ICD-10-CM

## 2024-12-17 NOTE — Progress Notes (Signed)
 Cardiac Individual Treatment Plan  Patient Details  Name: Kyle Matthews MRN: [id number] Date of Birth: Dec 02, 1946 Referring Provider:   Flowsheet Row CARDIAC REHAB PHASE II ORIENTATION from 11/20/2024 in [facility]  Referring Provider Wendel Haws MD    Initial Encounter Date:  Flowsheet Row CARDIAC REHAB PHASE II ORIENTATION from 11/20/2024 in [facility]  Date 11/20/24    Visit Diagnosis: S/P CABG x 2  S/P tricuspid valve replacement  Patient's Home Medications on Admission: Current Medications[1]  Past Medical History: Past Medical History:  Diagnosis Date   Anemia    Anxiety    Arthritis    BPH (benign prostatic hyperplasia)    Status post biopsy in 2009. - w/ LUTS   Coronary artery disease    a. 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   Severe aortic stenosis 04/13/2017   Stroke (HCC) 2007   No true etiology noted   UTI (urinary tract infection)     Tobacco Use: Tobacco Use History[2]  Labs: Review Flowsheet  More data exists      Latest Ref Rng & Units 06/26/2024 06/27/2024 06/28/2024 06/29/2024 06/30/2024  Labs for ITP Cardiac and Pulmonary Rehab  PH, Arterial 7.35 - 7.45 7.341  7.305  7.279  7.372  7.400  7.350  7.356  7.413  7.443  7.436  7.343  7.361  7.366  7.327  - - -  PCO2 arterial 32 - 48 mmHg 45.4  51.8  55.7  42.0  42.6  44.0  40.0  36.0  31.2  45.9  37.0  40.4  31.7  36.5  - - -  Bicarbonate 20.0 - 28.0 mmol/L 24.8  25.8  26.1   24.4  26.4  24.3  22.4  22.9  21.3  25.1  30.9  20.1  22.9  18.3  19.1  - - -  TCO2 22 - 32 mmol/L 26  27  25  28  25  26  26  28  24  26  24  25  24  24  22  23  26   32  26  21  23  24  19  20   - - -  Acid-base deficit 0.0 - 2.0 mmol/L 1.0  1.0  1.0  1.0  3.0  2.0  3.0  5.0  2.0  7.0  6.0  - - -  O2 Saturation % 97  100  92  100  100  100  100  100  100  88  100  100  92  91  98  57.7  77  77     Details       Multiple values from one day are sorted in reverse-chronological order         Capillary Blood Glucose: Lab Results  Component Value Date   GLUCAP 179 (H) 09/25/2024   GLUCAP 118 (H) 07/02/2024   GLUCAP 98 07/02/2024   GLUCAP 89 07/01/2024   GLUCAP 85 07/01/2024  Exercise Target Goals: Exercise Program Goal: Individual exercise prescription set using results from initial 6 min walk test and THRR while considering  patients activity barriers and safety.   Exercise Prescription Goal: Starting with aerobic activity 30 plus minutes a day, 3 days per week for initial exercise prescription. Provide home exercise prescription and guidelines that participant acknowledges understanding prior to discharge.  Activity Barriers & Risk Stratification:  Activity Barriers & Cardiac Risk Stratification - 11/20/24 1327       Activity Barriers & Cardiac Risk Stratification   Activity Barriers Deconditioning    Cardiac Risk Stratification High          6 Minute Walk:  6 Minute Walk     Row Name 11/20/24 1421         6 Minute Walk   Phase Initial     Distance 1250 feet     Walk Time 6 minutes     # of Rest Breaks 0     MPH 2.36     METS 2.33     RPE 14     Perceived Dyspnea  0     VO2 Peak 8.17     Symptoms No     Resting HR 74 bpm     Resting BP 110/62     Resting Oxygen Saturation  98 %     Exercise Oxygen Saturation  during 6 min walk 98 %     Max Ex. HR 109 bpm     Max Ex. BP 120/60     2 Minute Post BP 112/60        Oxygen Initial  Assessment:   Oxygen Re-Evaluation:   Oxygen Discharge (Final Oxygen Re-Evaluation):   Initial Exercise Prescription:  Initial Exercise Prescription - 11/20/24 1400       Date of Initial Exercise RX and Referring Provider   Date 11/20/24    Referring Provider Wendel Haws MD      Treadmill   MPH 1    Grade 0    Minutes 15    METs 1.77      NuStep   Level 1    SPM 50    Minutes 15    METs 1.8      Prescription Details   Frequency (times per week) 2    Duration Progress to 30 minutes of continuous aerobic without signs/symptoms of physical distress      Intensity   THRR 40-80% of Max Heartrate 102-130    Ratings of Perceived Exertion 11-13    Perceived Dyspnea 0-4      Resistance Training   Training Prescription Yes    Weight 3    Reps 10-15          Perform Capillary Blood Glucose checks as needed.  Exercise Prescription Changes:   Exercise Prescription Changes     Row Name 11/20/24 1400 12/02/24 1500 12/04/24 1500         Response to Exercise   Blood Pressure (Admit) 110/62 112/60 122/68     Blood Pressure (Exercise) 120/60 142/74 102/60     Blood Pressure (Exit) 112/60 124/66 122/62     Heart Rate (Admit) 74 bpm 81 bpm 75 bpm     Heart Rate (Exercise) 109 bpm 105 bpm 104 bpm     Heart Rate (Exit) 80 bpm 90 bpm 95 bpm     Oxygen Saturation (Admit) 98 % -- --     Oxygen Saturation (Exercise) 98 % -- --  Oxygen Saturation (Exit) 96 % -- --     Rating of Perceived Exertion (Exercise) 14 14 12      Perceived Dyspnea (Exercise) 0 -- --     Duration -- Continue with 30 min of aerobic exercise without signs/symptoms of physical distress. Continue with 30 min of aerobic exercise without signs/symptoms of physical distress.     Intensity -- THRR unchanged THRR unchanged       Progression   Progression -- Continue to progress workloads to maintain intensity without signs/symptoms of physical distress. Continue to progress workloads to maintain  intensity without signs/symptoms of physical distress.       Resistance Training   Weight -- 3 3     Reps -- 10-15 10-15       Treadmill   MPH -- 1.2 1.3     Grade -- 0 0     Minutes -- 15 15     METs -- 1.92 1.99       NuStep   Level -- 1 1     SPM -- 77 82     Minutes -- 15 15     METs -- 2.1 2        Exercise Comments:   Exercise Comments     Row Name 12/02/24 1059           Exercise Comments First full day of exercise!  Patient was oriented to gym and equipment including functions, settings, policies, and procedures.  Patient's individual exercise prescription and treatment plan were reviewed.  All starting workloads were established based on the results of the 6 minute walk test done at initial orientation visit.  The plan for exercise progression was also introduced and progression will be customized based on patient's performance and goals.          Exercise Goals and Review:   Exercise Goals     Row Name 11/20/24 1424             Exercise Goals   Increase Physical Activity Yes       Intervention Provide advice, education, support and counseling about physical activity/exercise needs.;Develop an individualized exercise prescription for aerobic and resistive training based on initial evaluation findings, risk stratification, comorbidities and participant's personal goals.       Expected Outcomes Short Term: Attend rehab on a regular basis to increase amount of physical activity.;Long Term: Add in home exercise to make exercise part of routine and to increase amount of physical activity.;Long Term: Exercising regularly at least 3-5 days a week.       Increase Strength and Stamina Yes       Intervention Provide advice, education, support and counseling about physical activity/exercise needs.;Develop an individualized exercise prescription for aerobic and resistive training based on initial evaluation findings, risk stratification, comorbidities and participant's  personal goals.       Expected Outcomes Short Term: Increase workloads from initial exercise prescription for resistance, speed, and METs.;Short Term: Perform resistance training exercises routinely during rehab and add in resistance training at home;Long Term: Improve cardiorespiratory fitness, muscular endurance and strength as measured by increased METs and functional capacity ( )       Able to understand and use rate of perceived exertion (RPE) scale Yes       Intervention Provide education and explanation on how to use RPE scale       Expected Outcomes Short Term: Able to use RPE daily in rehab to express subjective intensity level;Long Term:  Able to  use RPE to guide intensity level when exercising independently       Able to understand and use Dyspnea scale Yes       Intervention Provide education and explanation on how to use Dyspnea scale       Expected Outcomes Short Term: Able to use Dyspnea scale daily in rehab to express subjective sense of shortness of breath during exertion;Long Term: Able to use Dyspnea scale to guide intensity level when exercising independently       Knowledge and understanding of Target Heart Rate Range (THRR) Yes       Intervention Provide education and explanation of THRR including how the numbers were predicted and where they are located for reference       Expected Outcomes Short Term: Able to state/look up THRR;Long Term: Able to use THRR to govern intensity when exercising independently;Short Term: Able to use daily as guideline for intensity in rehab       Able to check pulse independently Yes       Intervention Provide education and demonstration on how to check pulse in carotid and radial arteries.;Review the importance of being able to check your own pulse for safety during independent exercise       Expected Outcomes Short Term: Able to explain why pulse checking is important during independent exercise;Long Term: Able to check pulse independently and  accurately       Understanding of Exercise Prescription Yes       Intervention Provide education, explanation, and written materials on patient's individual exercise prescription       Expected Outcomes Short Term: Able to explain program exercise prescription;Long Term: Able to explain home exercise prescription to exercise independently          Exercise Goals Re-Evaluation :  Exercise Goals Re-Evaluation     Row Name 12/02/24 1059 12/03/24 0907           Exercise Goal Re-Evaluation   Exercise Goals Review Increase Strength and Stamina;Increase Physical Activity;Able to understand and use Dyspnea scale;Able to understand and use rate of perceived exertion (RPE) scale;Knowledge and understanding of Target Heart Rate Range (THRR);Able to check pulse independently;Understanding of Exercise Prescription Increase Physical Activity;Increase Strength and Stamina;Understanding of Exercise Prescription      Comments Reviewed RPE and dyspnea scale, THR and program prescription with pt today.  Pt voiced understanding and was given a copy of goals to take home. Kyle Matthews completed his firest session of CR. HE did well with the nustep anmd treadmill. Will continue to monitor and progress as able      Expected Outcomes Short: Use RPE daily to regulate intensity.  Long: Follow program prescription in THR. Short: continue to attned rehab   long: add exercise outsode rehab          Discharge Exercise Prescription (Final Exercise Prescription Changes):  Exercise Prescription Changes - 12/04/24 1500       Response to Exercise   Blood Pressure (Admit) 122/68    Blood Pressure (Exercise) 102/60    Blood Pressure (Exit) 122/62    Heart Rate (Admit) 75 bpm    Heart Rate (Exercise) 104 bpm    Heart Rate (Exit) 95 bpm    Rating of Perceived Exertion (Exercise) 12    Duration Continue with 30 min of aerobic exercise without signs/symptoms of physical distress.    Intensity THRR unchanged      Progression    Progression Continue to progress workloads to maintain intensity without signs/symptoms of physical  distress.      Resistance Training   Weight 3    Reps 10-15      Treadmill   MPH 1.3    Grade 0    Minutes 15    METs 1.99      NuStep   Level 1    SPM 82    Minutes 15    METs 2          Nutrition:  Target Goals: Understanding of nutrition guidelines, daily intake of sodium 1500mg , cholesterol 200mg , calories 30% from fat and 7% or less from saturated fats, daily to have 5 or more servings of fruits and vegetables.  Biometrics:  Pre Biometrics - 11/20/24 1424       Pre Biometrics   Height 5' 3 (1.6 m)    Weight 163 lb 2.3 oz (74 kg)    Waist Circumference 38 inches    Hip Circumference 41 inches    Waist to Hip Ratio 0.93 %    BMI (Calculated) 28.91    Grip Strength 22.1 kg    Single Leg Stand 5.46 seconds           Nutrition Therapy Plan and Nutrition Goals:   Nutrition Assessments:  Nutrition Assessments - 12/04/24 1029       Rate Your Plate Scores   Pre Score 55         MEDIFICTS Score Key: >=70 Need to make dietary changes  40-70 Heart Healthy Diet <= 40 Therapeutic Level Cholesterol Diet  Flowsheet Row CARDIAC REHAB PHASE II EXERCISE from 12/04/2024 in [facility]  Picture Your Plate Total Score on Admission 55   Picture Your Plate Scores: <59 Unhealthy dietary pattern with much room for improvement. 41-50 Dietary pattern unlikely to meet recommendations for good health and room for improvement. 51-60 More healthful dietary pattern, with some room for improvement.  >60 Healthy dietary pattern, although there may be some specific behaviors that could be improved.    Nutrition Goals Re-Evaluation:   Nutrition Goals Discharge (Final Nutrition Goals Re-Evaluation):   Psychosocial: Target Goals: Acknowledge presence or absence of significant depression and/or stress, maximize coping skills, provide positive  support system. Participant is able to verbalize types and ability to use techniques and skills needed for reducing stress and depression.  Initial Review & Psychosocial Screening:  Initial Psych Review & Screening - 11/20/24 1353       Initial Review   Current issues with None Identified      Family Dynamics   Good Support System? Yes    Comments Patients wife and daugher support him.      Barriers   Psychosocial barriers to participate in program There are no identifiable barriers or psychosocial needs.;The patient should benefit from training in stress management and relaxation.      Screening Interventions   Interventions Encouraged to exercise;To provide support and resources with identified psychosocial needs;Provide feedback about the scores to participant    Expected Outcomes Long Term Goal: Stressors or current issues are controlled or eliminated.;Short Term goal: Identification and review with participant of any Quality of Life or Depression concerns found by scoring the questionnaire.;Long Term goal: The participant improves quality of Life and PHQ9 Scores as seen by post scores and/or verbalization of changes          Quality of Life Scores:  Quality of Life - 11/20/24 1425       Quality of Life   Select Quality of Life  Quality of Life Scores   Health/Function Pre 11.23 %    Socioeconomic Pre 28.29 %    Psych/Spiritual Pre 22.79 %    Family Pre 27.6 %    GLOBAL Pre 19.53 %         Scores of 19 and below usually indicate a poorer quality of life in these areas.  A difference of  2-3 points is a clinically meaningful difference.  A difference of 2-3 points in the total score of the Quality of Life Index has been associated with significant improvement in overall quality of life, self-image, physical symptoms, and general health in studies assessing change in quality of life.  PHQ-9: Review Flowsheet       11/20/2024 02/12/2024  Depression screen PHQ 2/9   Decreased Interest 0 1  Down, Depressed, Hopeless 0 2  PHQ - 2 Score 0 3  Altered sleeping 1 2  Tired, decreased energy 3 3  Change in appetite 0 0  Feeling bad or failure about yourself  0 2  Trouble concentrating 0 1  Moving slowly or fidgety/restless 0 0  Suicidal thoughts 0 0  PHQ-9 Score 4 11   Difficult doing work/chores Somewhat difficult Somewhat difficult    Details       Data saved with a previous flowsheet row definition        Interpretation of Total Score  Total Score Depression Severity:  1-4 = Minimal depression, 5-9 = Mild depression, 10-14 = Moderate depression, 15-19 = Moderately severe depression, 20-27 = Severe depression   Psychosocial Evaluation and Intervention:  Psychosocial Evaluation - 11/20/24 1356       Psychosocial Evaluation & Interventions   Interventions Stress management education;Relaxation education;Encouraged to exercise with the program and follow exercise prescription    Comments Patient was referred to cardiac rehab with S/P CABGx1 and Tricuspid valve replacement. He started the program in February of this year with AV replacement but had an indwelling urinay catheter and was not able to exercise. He has since had his prostate removed. He denies any depression or anxiety or stressors. He says he sleeps well. He is not very active and his main goal is to get into an exercise routine and improve his fatigue and energy levels. He has no barriers identified to complete the program.    Expected Outcomes Short Term: Patient will start the program and attend consistently. Long Term: Patient will complete the program meeting personal goals.    Continue Psychosocial Services  Follow up required by staff          Psychosocial Re-Evaluation:   Psychosocial Discharge (Final Psychosocial Re-Evaluation):   Vocational Rehabilitation: Provide vocational rehab assistance to qualifying candidates.   Vocational Rehab Evaluation &  Intervention:   Education: Education Goals: Education classes will be provided on a weekly basis, covering required topics. Participant will state understanding/return demonstration of topics presented.  Learning Barriers/Preferences:  Learning Barriers/Preferences - 11/20/24 1352       Learning Barriers/Preferences   Learning Barriers None    Learning Preferences Written Material;Verbal Instruction;Skilled Demonstration;Audio          Education Topics: Hypertension, Hypertension Reduction -Define heart disease and high blood pressure. Discus how high blood pressure affects the body and ways to reduce high blood pressure.   Exercise and Your Heart -Discuss why it is important to exercise, the FITT principles of exercise, normal and abnormal responses to exercise, and how to exercise safely.   Angina -Discuss definition of angina, causes of angina,  treatment of angina, and how to decrease risk of having angina.   Cardiac Medications -Review what the following cardiac medications are used for, how they affect the body, and side effects that may occur when taking the medications.  Medications include Aspirin , Beta blockers, calcium  channel blockers, ACE Inhibitors, angiotensin receptor blockers, diuretics, digoxin, and antihyperlipidemics.   Congestive Heart Failure -Discuss the definition of CHF, how to live with CHF, the signs and symptoms of CHF, and how keep track of weight and sodium intake.   Heart Disease and Intimacy -Discus the effect sexual activity has on the heart, how changes occur during intimacy as we age, and safety during sexual activity.   Smoking Cessation / COPD -Discuss different methods to quit smoking, the health benefits of quitting smoking, and the definition of COPD.   Nutrition I: Fats -Discuss the types of cholesterol, what cholesterol does to the heart, and how cholesterol levels can be controlled.   Nutrition II: Labels -Discuss the  different components of food labels and how to read food label   Heart Parts/Heart Disease and PAD -Discuss the anatomy of the heart, the pathway of blood circulation through the heart, and these are affected by heart disease.   Stress I: Signs and Symptoms -Discuss the causes of stress, how stress may lead to anxiety and depression, and ways to limit stress. Flowsheet Row CARDIAC REHAB PHASE II EXERCISE from 02/28/2024 in [facility]  Date 02/28/24  Educator jh  Instruction Review Code 1- Verbalizes Understanding    Stress II: Relaxation -Discuss different types of relaxation techniques to limit stress.   Warning Signs of Stroke / TIA -Discuss definition of a stroke, what the signs and symptoms are of a stroke, and how to identify when someone is having stroke.   Knowledge Questionnaire Score:  Knowledge Questionnaire Score - 12/04/24 1031       Knowledge Questionnaire Score   Pre Score 24/26          Core Components/Risk Factors/Patient Goals at Admission:  Personal Goals and Risk Factors at Admission - 11/20/24 1352       Core Components/Risk Factors/Patient Goals on Admission    Weight Management Weight Maintenance    Heart Failure Yes    Intervention Provide a combined exercise and nutrition program that is supplemented with education, support and counseling about heart failure. Directed toward relieving symptoms such as shortness of breath, decreased exercise tolerance, and extremity edema.    Expected Outcomes Improve functional capacity of life;Short term: Attendance in program 2-3 days a week with increased exercise capacity. Reported lower sodium intake. Reported increased fruit and vegetable intake. Reports medication compliance.;Short term: Daily weights obtained and reported for increase. Utilizing diuretic protocols set by physician.;Long term: Adoption of self-care skills and reduction of barriers for early signs and symptoms recognition  and intervention leading to self-care maintenance.    Hypertension Yes    Intervention Provide education on lifestyle modifcations including regular physical activity/exercise, weight management, moderate sodium restriction and increased consumption of fresh fruit, vegetables, and low fat dairy, alcohol moderation, and smoking cessation.;Monitor prescription use compliance.    Expected Outcomes Short Term: Continued assessment and intervention until BP is < 140/74mm HG in hypertensive participants. < 130/61mm HG in hypertensive participants with diabetes, heart failure or chronic kidney disease.;Long Term: Maintenance of blood pressure at goal levels.    Lipids Yes    Intervention Provide education and support for participant on nutrition & aerobic/resistive exercise along with prescribed medications to  achieve LDL 70mg , HDL >40mg .    Expected Outcomes Short Term: Participant states understanding of desired cholesterol values and is compliant with medications prescribed. Participant is following exercise prescription and nutrition guidelines.;Long Term: Cholesterol controlled with medications as prescribed, with individualized exercise RX and with personalized nutrition plan. Value goals: LDL < 70mg , HDL > 40 mg.          Core Components/Risk Factors/Patient Goals Review:    Core Components/Risk Factors/Patient Goals at Discharge (Final Review):    ITP Comments:  ITP Comments     Row Name 11/20/24 1410 12/02/24 1059 12/17/24 1321       ITP Comments Patient arrived for 1st visit/orientation/education at 1300. Patient was referred to CR by Dr. Lurena Red due to Status Post CABGx1/Trisucpid valve replacement. During orientation advised patient on arrival and appointment times what to wear, what to do before, during and after exercise. Reviewed attendance and class policy.  Pt is scheduled to return Cardiac Rehab on 11/25/24 at 1100. Pt was advised to come to class 15 minutes before class  starts.  Discussed RPE/Dpysnea scales. Patient participated in warm up stretches. Patient was able to complete 6 minute walk test.  Telemetry:Ventricular paced rhythm. Patient was measured for the equipment. Discussed equipment safety with patient. Took patient pre-anthropometric measurements. Patient finished visit at 1410. First full day of exercise!  Patient was oriented to gym and equipment including functions, settings, policies, and procedures.  Patient's individual exercise prescription and treatment plan were reviewed.  All starting workloads were established based on the results of the 6 minute walk test done at initial orientation visit.  The plan for exercise progression was also introduced and progression will be customized based on patient's performance and goals. 30 day review completed. ITP sent to Dr. Dorn Ross, Medical Director of Cardiac Rehab. Continue with ITP unless changes are made by physician.  Still new to program        Comments: 30 day review     [1]  Current Outpatient Medications:    acetaminophen  (TYLENOL ) 325 MG tablet, Take 2 tablets (650 mg total) by mouth every 6 (six) hours as needed for mild pain (pain score 1-3) or fever (or Fever >/= 101)., Disp: , Rfl:    amiodarone  (PACERONE ) 200 MG tablet, Take 1 tablet (200 mg total) by mouth daily., Disp: 90 tablet, Rfl: 1   amLODipine  (NORVASC ) 5 MG tablet, 1 tablet Orally Once a day; Duration: 30 days, Disp: , Rfl:    apixaban  (ELIQUIS ) 5 MG TABS tablet, Take 1 tablet (5 mg total) by mouth 2 (two) times daily., Disp: 60 tablet, Rfl: 6   diphenhydrAMINE  (BENADRYL ) 25 MG tablet, Take 25 mg by mouth every 6 (six) hours as needed for itching., Disp: , Rfl:    diphenhydrAMINE -zinc  acetate (BENADRYL ) cream, Apply 1 Application topically 3 (three) times daily as needed for itching., Disp: , Rfl:    finasteride  (PROSCAR ) 5 MG tablet, Take 5 mg by mouth every evening., Disp: , Rfl:    midodrine  (PROAMATINE ) 5 MG tablet, Take  1 tablet (5 mg total) by mouth 3 (three) times daily with meals. (Patient not taking: Reported on 11/20/2024), Disp: 30 tablet, Rfl: 0   nitroGLYCERIN  (NITROSTAT ) 0.4 MG SL tablet, Place 1 tablet (0.4 mg total) under the tongue every 5 (five) minutes as needed for chest pain., Disp: 25 tablet, Rfl: 3   ondansetron  (ZOFRAN -ODT) 4 MG disintegrating tablet, Take 1 tablet (4 mg total) by mouth every 8 (eight) hours as needed  for nausea or vomiting., Disp: 20 tablet, Rfl: 0   oxyCODONE  (OXY IR/ROXICODONE ) 5 MG immediate release tablet, Take 1 tablet (5 mg total) by mouth every 4 (four) hours as needed for moderate pain (pain score 4-6)., Disp: 30 tablet, Rfl: 0   polyethylene glycol (MIRALAX  / GLYCOLAX ) 17 g packet, Take 17 g by mouth daily as needed., Disp: , Rfl:    rosuvastatin  (CRESTOR ) 40 MG tablet, Take 1 tablet (40 mg total) by mouth daily., Disp: 30 tablet, Rfl: 3   senna-docusate (SENOKOT-S) 8.6-50 MG tablet, Take 2 tablets by mouth at bedtime., Disp: 60 tablet, Rfl: 1 [2]  Social History Tobacco Use  Smoking Status Every Day   Current packs/day: 0.00   Average packs/day: 1.5 packs/day for 24.0 years (36.0 ttl pk-yrs)   Types: Cigarettes   Last attempt to quit: 08/21/2023   Years since quitting: 1.3   Passive exposure: Past  Smokeless Tobacco Former  Tobacco Comments   Former smoker 07/17/24

## 2024-12-18 ENCOUNTER — Encounter (HOSPITAL_COMMUNITY)

## 2024-12-23 ENCOUNTER — Encounter (HOSPITAL_COMMUNITY)

## 2024-12-23 ENCOUNTER — Telehealth (HOSPITAL_COMMUNITY): Payer: Self-pay

## 2024-12-23 ENCOUNTER — Encounter (HOSPITAL_COMMUNITY): Payer: Self-pay

## 2024-12-23 DIAGNOSIS — Z951 Presence of aortocoronary bypass graft: Secondary | ICD-10-CM

## 2024-12-23 DIAGNOSIS — Z952 Presence of prosthetic heart valve: Secondary | ICD-10-CM

## 2024-12-23 DIAGNOSIS — Z954 Presence of other heart-valve replacement: Secondary | ICD-10-CM

## 2024-12-23 NOTE — Telephone Encounter (Signed)
 Called patient about missed appointment. HE expressed that he would like to be discharged due to feeling like he is exercising at home and forgetting appointments. Will discharge patient .

## 2024-12-23 NOTE — Progress Notes (Signed)
 Cardiac Individual Treatment Plan  Patient Details  Name: Kyle Matthews MRN: [id number] Date of Birth: 1946-01-06 Referring Provider:   Flowsheet Row CARDIAC REHAB PHASE II ORIENTATION from 11/20/2024 in [facility] CARDIAC REHABILITATION  Referring Provider Wendel Haws MD    Initial Encounter Date:  Flowsheet Row CARDIAC REHAB PHASE II ORIENTATION from 11/20/2024 in [place] [facility]  Date 11/20/24    Visit Diagnosis: S/P CABG x 2  S/P tricuspid valve replacement  S/P AVR (aortic valve replacement)  Patient's Home Medications on Admission: Current Medications[1]  Past Medical History: Past Medical History:  Diagnosis Date   Anemia    Anxiety    Arthritis    BPH (benign prostatic hyperplasia)    Status post biopsy in 2009. - w/ LUTS   Coronary artery disease    a. 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   Severe aortic stenosis 04/13/2017   Stroke (HCC) 2007   No true etiology noted   UTI (urinary tract infection)     Tobacco Use: Tobacco Use History[2]  Labs: Review Flowsheet  More data exists      Latest Ref Rng & Units 06/26/2024 06/27/2024 06/28/2024 06/29/2024 06/30/2024  Labs for ITP Cardiac and Pulmonary Rehab  PH, Arterial 7.35 - 7.45 7.341  7.305  7.279  7.372  7.400  7.350  7.356  7.413  7.443  7.436  7.343  7.361  7.366  7.327  - - -  PCO2 arterial 32 - 48 mmHg 45.4  51.8  55.7  42.0  42.6  44.0  40.0  36.0  31.2  45.9  37.0  40.4  31.7  36.5  - - -   Bicarbonate 20.0 - 28.0 mmol/L 24.8  25.8  26.1  24.4  26.4  24.3  22.4  22.9  21.3  25.1  30.9  20.1  22.9  18.3  19.1  - - -  TCO2 22 - 32 mmol/L 26  27  25  28  25  26  26  28  24  26  24  25  24  24  22  23  26   32  26  21  23  24  19  20   - - -  Acid-base deficit 0.0 - 2.0 mmol/L 1.0  1.0  1.0  1.0  3.0  2.0  3.0  5.0  2.0  7.0  6.0  - - -  O2 Saturation % 97  100  92  100  100  100  100  100  100  88  100  100  92  91  98  57.7  77  77     Details       Multiple values from one day are sorted in reverse-chronological order          Exercise Target Goals: Exercise Program Goal: Individual exercise prescription set using results from initial 6 min walk test and THRR while considering  patients activity barriers and safety.  Exercise Prescription Goal: Initial exercise prescription builds to 30-45 minutes a day of aerobic activity, 2-3 days per week.  Home exercise guidelines will be given to patient during program as part of exercise prescription that the participant will acknowledge.   Education: Aerobic Exercise: - Group verbal and visual presentation on the components of exercise prescription. Introduces F.I.T.T principle from ACSM for exercise prescriptions.  Reviews F.I.T.T. principles of aerobic exercise including progression. Written material provided at class time.   Education: Resistance Exercise: - Group verbal and visual presentation on the components of exercise prescription. Introduces F.I.T.T principle from ACSM for exercise prescriptions  Reviews F.I.T.T. principles of resistance exercise including progression. Written material provided at class time.    Education: Exercise & Equipment Safety: - Individual verbal instruction and demonstration of equipment use and safety with use of the equipment.   Education: Exercise Physiology & General Exercise Guidelines: - Group verbal and written instruction with models to review the exercise physiology of the  cardiovascular system and associated critical values. Provides general exercise guidelines with specific guidelines to those with heart or lung disease. Written material provided at class time.   Education: Flexibility, Balance, Mind/Body Relaxation: - Group verbal and visual presentation with interactive activity on the components of exercise prescription. Introduces F.I.T.T principle from ACSM for exercise prescriptions. Reviews F.I.T.T. principles of flexibility and balance exercise training including progression. Also discusses the mind body connection.  Reviews various relaxation techniques to help reduce and manage stress (i.e. Deep breathing, progressive muscle relaxation, and visualization). Balance handout provided to take home. Written material provided at class time.   Activity Barriers & Risk Stratification:  Activity Barriers & Cardiac Risk Stratification - 11/20/24 1327       Activity Barriers & Cardiac Risk Stratification   Activity Barriers Deconditioning    Cardiac Risk Stratification High          6 Minute Walk:  6 Minute Walk     Row Name 11/20/24 1421         6 Minute Walk   Phase Initial     Distance 1250 feet     Walk Time 6 minutes     # of Rest Breaks 0     MPH 2.36     METS 2.33     RPE 14     Perceived Dyspnea  0     VO2 Peak 8.17     Symptoms No     Resting HR 74 bpm     Resting BP 110/62     Resting Oxygen Saturation  98 %     Exercise Oxygen Saturation  during 6 min walk 98 %     Max Ex. HR 109 bpm     Max Ex. BP 120/60     2 Minute Post BP 112/60        Oxygen Initial Assessment:   Oxygen Re-Evaluation:   Oxygen Discharge (Final Oxygen Re-Evaluation):   Initial Exercise Prescription:  Initial Exercise Prescription - 11/20/24 1400       Date of Initial Exercise RX and Referring Provider   Date 11/20/24    Referring Provider Wendel Haws MD      Treadmill   MPH 1    Grade 0    Minutes 15    METs 1.77      NuStep    Level 1    SPM 50    Minutes 15    METs 1.8      Prescription Details  Frequency (times per week) 2    Duration Progress to 30 minutes of continuous aerobic without signs/symptoms of physical distress      Intensity   THRR 40-80% of Max Heartrate 102-130    Ratings of Perceived Exertion 11-13    Perceived Dyspnea 0-4      Resistance Training   Training Prescription Yes    Weight 3    Reps 10-15          Perform Capillary Blood Glucose checks as needed.  Exercise Prescription Changes:   Exercise Prescription Changes     Row Name 11/20/24 1400 12/02/24 1500 12/04/24 1500         Response to Exercise   Blood Pressure (Admit) 110/62 112/60 122/68     Blood Pressure (Exercise) 120/60 142/74 102/60     Blood Pressure (Exit) 112/60 124/66 122/62     Heart Rate (Admit) 74 bpm 81 bpm 75 bpm     Heart Rate (Exercise) 109 bpm 105 bpm 104 bpm     Heart Rate (Exit) 80 bpm 90 bpm 95 bpm     Oxygen Saturation (Admit) 98 % -- --     Oxygen Saturation (Exercise) 98 % -- --     Oxygen Saturation (Exit) 96 % -- --     Rating of Perceived Exertion (Exercise) 14 14 12      Perceived Dyspnea (Exercise) 0 -- --     Duration -- Continue with 30 min of aerobic exercise without signs/symptoms of physical distress. Continue with 30 min of aerobic exercise without signs/symptoms of physical distress.     Intensity -- THRR unchanged THRR unchanged       Progression   Progression -- Continue to progress workloads to maintain intensity without signs/symptoms of physical distress. Continue to progress workloads to maintain intensity without signs/symptoms of physical distress.       Resistance Training   Weight -- 3 3     Reps -- 10-15 10-15       Treadmill   MPH -- 1.2 1.3     Grade -- 0 0     Minutes -- 15 15     METs -- 1.92 1.99       NuStep   Level -- 1 1     SPM -- 77 82     Minutes -- 15 15     METs -- 2.1 2        Exercise Comments:   Exercise Comments     Row Name  12/02/24 1059           Exercise Comments First full day of exercise!  Patient was oriented to gym and equipment including functions, settings, policies, and procedures.  Patient's individual exercise prescription and treatment plan were reviewed.  All starting workloads were established based on the results of the 6 minute walk test done at initial orientation visit.  The plan for exercise progression was also introduced and progression will be customized based on patient's performance and goals.          Exercise Goals and Review:   Exercise Goals     Row Name 11/20/24 1424             Exercise Goals   Increase Physical Activity Yes       Intervention Provide advice, education, support and counseling about physical activity/exercise needs.;Develop an individualized exercise prescription for aerobic and resistive training based on initial evaluation findings, risk stratification, comorbidities and participant's personal goals.  Expected Outcomes Short Term: Attend rehab on a regular basis to increase amount of physical activity.;Long Term: Add in home exercise to make exercise part of routine and to increase amount of physical activity.;Long Term: Exercising regularly at least 3-5 days a week.       Increase Strength and Stamina Yes       Intervention Provide advice, education, support and counseling about physical activity/exercise needs.;Develop an individualized exercise prescription for aerobic and resistive training based on initial evaluation findings, risk stratification, comorbidities and participant's personal goals.       Expected Outcomes Short Term: Increase workloads from initial exercise prescription for resistance, speed, and METs.;Short Term: Perform resistance training exercises routinely during rehab and add in resistance training at home;Long Term: Improve cardiorespiratory fitness, muscular endurance and strength as measured by increased METs and functional capacity  ( )       Able to understand and use rate of perceived exertion (RPE) scale Yes       Intervention Provide education and explanation on how to use RPE scale       Expected Outcomes Short Term: Able to use RPE daily in rehab to express subjective intensity level;Long Term:  Able to use RPE to guide intensity level when exercising independently       Able to understand and use Dyspnea scale Yes       Intervention Provide education and explanation on how to use Dyspnea scale       Expected Outcomes Short Term: Able to use Dyspnea scale daily in rehab to express subjective sense of shortness of breath during exertion;Long Term: Able to use Dyspnea scale to guide intensity level when exercising independently       Knowledge and understanding of Target Heart Rate Range (THRR) Yes       Intervention Provide education and explanation of THRR including how the numbers were predicted and where they are located for reference       Expected Outcomes Short Term: Able to state/look up THRR;Long Term: Able to use THRR to govern intensity when exercising independently;Short Term: Able to use daily as guideline for intensity in rehab       Able to check pulse independently Yes       Intervention Provide education and demonstration on how to check pulse in carotid and radial arteries.;Review the importance of being able to check your own pulse for safety during independent exercise       Expected Outcomes Short Term: Able to explain why pulse checking is important during independent exercise;Long Term: Able to check pulse independently and accurately       Understanding of Exercise Prescription Yes       Intervention Provide education, explanation, and written materials on patient's individual exercise prescription       Expected Outcomes Short Term: Able to explain program exercise prescription;Long Term: Able to explain home exercise prescription to exercise independently          Exercise Goals Re-Evaluation  :  Exercise Goals Re-Evaluation     Row Name 12/02/24 1059 12/03/24 0907           Exercise Goal Re-Evaluation   Exercise Goals Review Increase Strength and Stamina;Increase Physical Activity;Able to understand and use Dyspnea scale;Able to understand and use rate of perceived exertion (RPE) scale;Knowledge and understanding of Target Heart Rate Range (THRR);Able to check pulse independently;Understanding of Exercise Prescription Increase Physical Activity;Increase Strength and Stamina;Understanding of Exercise Prescription      Comments Reviewed RPE and  dyspnea scale, THR and program prescription with pt today.  Pt voiced understanding and was given a copy of goals to take home. Edger completed his firest session of CR. HE did well with the nustep anmd treadmill. Will continue to monitor and progress as able      Expected Outcomes Short: Use RPE daily to regulate intensity.  Long: Follow program prescription in THR. Short: continue to attned rehab   long: add exercise outsode rehab         Discharge Exercise Prescription (Final Exercise Prescription Changes):  Exercise Prescription Changes - 12/04/24 1500       Response to Exercise   Blood Pressure (Admit) 122/68    Blood Pressure (Exercise) 102/60    Blood Pressure (Exit) 122/62    Heart Rate (Admit) 75 bpm    Heart Rate (Exercise) 104 bpm    Heart Rate (Exit) 95 bpm    Rating of Perceived Exertion (Exercise) 12    Duration Continue with 30 min of aerobic exercise without signs/symptoms of physical distress.    Intensity THRR unchanged      Progression   Progression Continue to progress workloads to maintain intensity without signs/symptoms of physical distress.      Resistance Training   Weight 3    Reps 10-15      Treadmill   MPH 1.3    Grade 0    Minutes 15    METs 1.99      NuStep   Level 1    SPM 82    Minutes 15    METs 2          Nutrition:  Target Goals: Understanding of nutrition guidelines, daily  intake of sodium 1500mg , cholesterol 200mg , calories 30% from fat and 7% or less from saturated fats, daily to have 5 or more servings of fruits and vegetables.  Education: Nutrition 1 -Group instruction provided by verbal, written material, interactive activities, discussions, models, and posters to present general guidelines for heart healthy nutrition including macronutrients, label reading, and promoting whole foods over processed counterparts. Education serves as pensions consultant of discussion of heart healthy eating for all. Written material provided at class time.    Education: Nutrition 2 -Group instruction provided by verbal, written material, interactive activities, discussions, models, and posters to present general guidelines for heart healthy nutrition including sodium, cholesterol, and saturated fat. Providing guidance of habit forming to improve blood pressure, cholesterol, and body weight. Written material provided at class time.     Biometrics:  Pre Biometrics - 11/20/24 1424       Pre Biometrics   Height 5' 3 (1.6 m)    Weight 163 lb 2.3 oz (74 kg)    Waist Circumference 38 inches    Hip Circumference 41 inches    Waist to Hip Ratio 0.93 %    BMI (Calculated) 28.91    Grip Strength 22.1 kg    Single Leg Stand 5.46 seconds           Nutrition Therapy Plan and Nutrition Goals:   Nutrition Assessments:  Nutrition Assessments - 12/04/24 1029       Rate Your Plate Scores   Pre Score 55         MEDIFICTS Score Key: >=70 Need to make dietary changes  40-70 Heart Healthy Diet <= 40 Therapeutic Level Cholesterol Diet  Flowsheet Row CARDIAC REHAB PHASE II EXERCISE from 12/04/2024 in [facility] CARDIAC REHABILITATION  Picture Your Plate Total Score on Admission 55  Picture Your Plate Scores: <59 Unhealthy dietary pattern with much room for improvement. 41-50 Dietary pattern unlikely to meet recommendations for good health and room for improvement. 51-60  More healthful dietary pattern, with some room for improvement.  >60 Healthy dietary pattern, although there may be some specific behaviors that could be improved.    Nutrition Goals Re-Evaluation:   Nutrition Goals Discharge (Final Nutrition Goals Re-Evaluation):   Psychosocial: Target Goals: Acknowledge presence or absence of significant depression and/or stress, maximize coping skills, provide positive support system. Participant is able to verbalize types and ability to use techniques and skills needed for reducing stress and depression.   Education: Stress, Anxiety, and Depression - Group verbal and visual presentation to define topics covered.  Reviews how body is impacted by stress, anxiety, and depression.  Also discusses healthy ways to reduce stress and to treat/manage anxiety and depression. Written material provided at class time.   Education: Sleep Hygiene -Provides group verbal and written instruction about how sleep can affect your health.  Define sleep hygiene, discuss sleep cycles and impact of sleep habits. Review good sleep hygiene tips.   Initial Review & Psychosocial Screening:  Initial Psych Review & Screening - 11/20/24 1353       Initial Review   Current issues with None Identified      Family Dynamics   Good Support System? Yes    Comments Patients wife and daugher support him.      Barriers   Psychosocial barriers to participate in program There are no identifiable barriers or psychosocial needs.;The patient should benefit from training in stress management and relaxation.      Screening Interventions   Interventions Encouraged to exercise;To provide support and resources with identified psychosocial needs;Provide feedback about the scores to participant    Expected Outcomes Long Term Goal: Stressors or current issues are controlled or eliminated.;Short Term goal: Identification and review with participant of any Quality of Life or Depression concerns  found by scoring the questionnaire.;Long Term goal: The participant improves quality of Life and PHQ9 Scores as seen by post scores and/or verbalization of changes          Quality of Life Scores:   Quality of Life - 11/20/24 1425       Quality of Life   Select Quality of Life      Quality of Life Scores   Health/Function Pre 11.23 %    Socioeconomic Pre 28.29 %    Psych/Spiritual Pre 22.79 %    Family Pre 27.6 %    GLOBAL Pre 19.53 %         Scores of 19 and below usually indicate a poorer quality of life in these areas.  A difference of  2-3 points is a clinically meaningful difference.  A difference of 2-3 points in the total score of the Quality of Life Index has been associated with significant improvement in overall quality of life, self-image, physical symptoms, and general health in studies assessing change in quality of life.  PHQ-9: Review Flowsheet       11/20/2024 02/12/2024  Depression screen PHQ 2/9  Decreased Interest 0 1  Down, Depressed, Hopeless 0 2  PHQ - 2 Score 0 3  Altered sleeping 1 2  Tired, decreased energy 3 3  Change in appetite 0 0  Feeling bad or failure about yourself  0 2  Trouble concentrating 0 1  Moving slowly or fidgety/restless 0 0  Suicidal thoughts 0 0  PHQ-9 Score 4  11   Difficult doing work/chores Somewhat difficult Somewhat difficult    Details       Data saved with a previous flowsheet row definition        Interpretation of Total Score  Total Score Depression Severity:  1-4 = Minimal depression, 5-9 = Mild depression, 10-14 = Moderate depression, 15-19 = Moderately severe depression, 20-27 = Severe depression   Psychosocial Evaluation and Intervention:  Psychosocial Evaluation - 11/20/24 1356       Psychosocial Evaluation & Interventions   Interventions Stress management education;Relaxation education;Encouraged to exercise with the program and follow exercise prescription    Comments Patient was referred to  cardiac rehab with S/P CABGx1 and Tricuspid valve replacement. He started the program in February of this year with AV replacement but had an indwelling urinay catheter and was not able to exercise. He has since had his prostate removed. He denies any depression or anxiety or stressors. He says he sleeps well. He is not very active and his main goal is to get into an exercise routine and improve his fatigue and energy levels. He has no barriers identified to complete the program.    Expected Outcomes Short Term: Patient will start the program and attend consistently. Long Term: Patient will complete the program meeting personal goals.    Continue Psychosocial Services  Follow up required by staff          Psychosocial Re-Evaluation:   Psychosocial Discharge (Final Psychosocial Re-Evaluation):   Vocational Rehabilitation: Provide vocational rehab assistance to qualifying candidates.   Vocational Rehab Evaluation & Intervention:   Education: Education Goals: Education classes will be provided on a variety of topics geared toward better understanding of heart health and risk factor modification. Participant will state understanding/return demonstration of topics presented as noted by education test scores.  Learning Barriers/Preferences:  Learning Barriers/Preferences - 11/20/24 1352       Learning Barriers/Preferences   Learning Barriers None    Learning Preferences Written Material;Verbal Instruction;Skilled Demonstration;Audio          General Cardiac Education Topics:  AED/CPR: - Group verbal and written instruction with the use of models to demonstrate the basic use of the AED with the basic ABC's of resuscitation.   Test and Procedures: - Group verbal and visual presentation and models provide information about basic cardiac anatomy and function. Reviews the testing methods done to diagnose heart disease and the outcomes of the test results. Describes the treatment  choices: Medical Management, Angioplasty, or Coronary Bypass Surgery for treating various heart conditions including Myocardial Infarction, Angina, Valve Disease, and Cardiac Arrhythmias. Written material provided at class time. Flowsheet Row CARDIAC REHAB PHASE II EXERCISE from 12/04/2024 in [facility]  Date 12/04/24  Educator jh  Instruction Review Code 1- Verbalizes Understanding    Medication Safety: - Group verbal and visual instruction to review commonly prescribed medications for heart and lung disease. Reviews the medication, class of the drug, and side effects. Includes the steps to properly store meds and maintain the prescription regimen. Written material provided at class time.   Intimacy: - Group verbal instruction through game format to discuss how heart and lung disease can affect sexual intimacy. Written material provided at class time.   Know Your Numbers and Heart Failure: - Group verbal and visual instruction to discuss disease risk factors for cardiac and pulmonary disease and treatment options.  Reviews associated critical values for Overweight/Obesity, Hypertension, Cholesterol, and Diabetes.  Discusses basics of heart failure: signs/symptoms and  treatments.  Introduces Heart Failure Zone chart for action plan for heart failure. Written material provided at class time.   Infection Prevention: - Provides verbal and written material to individual with discussion of infection control including proper hand washing and proper equipment cleaning during exercise session.   Falls Prevention: - Provides verbal and written material to individual with discussion of falls prevention and safety.   Other: -Provides group and verbal instruction on various topics (see comments)   Knowledge Questionnaire Score:  Knowledge Questionnaire Score - 12/04/24 1031       Knowledge Questionnaire Score   Pre Score 24/26          Core Components/Risk  Factors/Patient Goals at Admission:  Personal Goals and Risk Factors at Admission - 11/20/24 1352       Core Components/Risk Factors/Patient Goals on Admission    Weight Management Weight Maintenance    Heart Failure Yes    Intervention Provide a combined exercise and nutrition program that is supplemented with education, support and counseling about heart failure. Directed toward relieving symptoms such as shortness of breath, decreased exercise tolerance, and extremity edema.    Expected Outcomes Improve functional capacity of life;Short term: Attendance in program 2-3 days a week with increased exercise capacity. Reported lower sodium intake. Reported increased fruit and vegetable intake. Reports medication compliance.;Short term: Daily weights obtained and reported for increase. Utilizing diuretic protocols set by physician.;Long term: Adoption of self-care skills and reduction of barriers for early signs and symptoms recognition and intervention leading to self-care maintenance.    Hypertension Yes    Intervention Provide education on lifestyle modifcations including regular physical activity/exercise, weight management, moderate sodium restriction and increased consumption of fresh fruit, vegetables, and low fat dairy, alcohol moderation, and smoking cessation.;Monitor prescription use compliance.    Expected Outcomes Short Term: Continued assessment and intervention until BP is < 140/33mm HG in hypertensive participants. < 130/18mm HG in hypertensive participants with diabetes, heart failure or chronic kidney disease.;Long Term: Maintenance of blood pressure at goal levels.    Lipids Yes    Intervention Provide education and support for participant on nutrition & aerobic/resistive exercise along with prescribed medications to achieve LDL 70mg , HDL >40mg .    Expected Outcomes Short Term: Participant states understanding of desired cholesterol values and is compliant with medications prescribed.  Participant is following exercise prescription and nutrition guidelines.;Long Term: Cholesterol controlled with medications as prescribed, with individualized exercise RX and with personalized nutrition plan. Value goals: LDL < 70mg , HDL > 40 mg.          Education:Diabetes - Individual verbal and written instruction to review signs/symptoms of diabetes, desired ranges of glucose level fasting, after meals and with exercise. Acknowledge that pre and post exercise glucose checks will be done for 3 sessions at entry of program.   Core Components/Risk Factors/Patient Goals Review:    Core Components/Risk Factors/Patient Goals at Discharge (Final Review):    ITP Comments:  ITP Comments     Row Name 11/20/24 1410 12/02/24 1059 12/17/24 1321 12/23/24 1144     ITP Comments Patient arrived for 1st visit/orientation/education at 1300. Patient was referred to CR by Dr. Lurena Red due to Status Post CABGx1/Trisucpid valve replacement. During orientation advised patient on arrival and appointment times what to wear, what to do before, during and after exercise. Reviewed attendance and class policy.  Pt is scheduled to return Cardiac Rehab on 11/25/24 at 1100. Pt was advised to come to class 15 minutes before class starts.  Discussed RPE/Dpysnea scales. Patient participated in warm up stretches. Patient was able to complete 6 minute walk test.  Telemetry:Ventricular paced rhythm. Patient was measured for the equipment. Discussed equipment safety with patient. Took patient pre-anthropometric measurements. Patient finished visit at 1410. First full day of exercise!  Patient was oriented to gym and equipment including functions, settings, policies, and procedures.  Patient's individual exercise prescription and treatment plan were reviewed.  All starting workloads were established based on the results of the 6 minute walk test done at initial orientation visit.  The plan for exercise progression was also  introduced and progression will be customized based on patient's performance and goals. 30 day review completed. ITP sent to Dr. Dorn Ross, Medical Director of Cardiac Rehab. Continue with ITP unless changes are made by physician.  Still new to program Talen was asked to be discharged from the porgram       Comments: Discharge itp    [1]  Current Outpatient Medications:    acetaminophen  (TYLENOL ) 325 MG tablet, Take 2 tablets (650 mg total) by mouth every 6 (six) hours as needed for mild pain (pain score 1-3) or fever (or Fever >/= 101)., Disp: , Rfl:    amiodarone  (PACERONE ) 200 MG tablet, Take 1 tablet (200 mg total) by mouth daily., Disp: 90 tablet, Rfl: 1   amLODipine  (NORVASC ) 5 MG tablet, 1 tablet Orally Once a day; Duration: 30 days, Disp: , Rfl:    apixaban  (ELIQUIS ) 5 MG TABS tablet, Take 1 tablet (5 mg total) by mouth 2 (two) times daily., Disp: 60 tablet, Rfl: 6   diphenhydrAMINE  (BENADRYL ) 25 MG tablet, Take 25 mg by mouth every 6 (six) hours as needed for itching., Disp: , Rfl:    diphenhydrAMINE -zinc  acetate (BENADRYL ) cream, Apply 1 Application topically 3 (three) times daily as needed for itching., Disp: , Rfl:    finasteride  (PROSCAR ) 5 MG tablet, Take 5 mg by mouth every evening., Disp: , Rfl:    midodrine  (PROAMATINE ) 5 MG tablet, Take 1 tablet (5 mg total) by mouth 3 (three) times daily with meals. (Patient not taking: Reported on 11/20/2024), Disp: 30 tablet, Rfl: 0   nitroGLYCERIN  (NITROSTAT ) 0.4 MG SL tablet, Place 1 tablet (0.4 mg total) under the tongue every 5 (five) minutes as needed for chest pain., Disp: 25 tablet, Rfl: 3   ondansetron  (ZOFRAN -ODT) 4 MG disintegrating tablet, Take 1 tablet (4 mg total) by mouth every 8 (eight) hours as needed for nausea or vomiting., Disp: 20 tablet, Rfl: 0   oxyCODONE  (OXY IR/ROXICODONE ) 5 MG immediate release tablet, Take 1 tablet (5 mg total) by mouth every 4 (four) hours as needed for moderate pain (pain score 4-6)., Disp: 30  tablet, Rfl: 0   polyethylene glycol (MIRALAX  / GLYCOLAX ) 17 g packet, Take 17 g by mouth daily as needed., Disp: , Rfl:    rosuvastatin  (CRESTOR ) 40 MG tablet, Take 1 tablet (40 mg total) by mouth daily., Disp: 30 tablet, Rfl: 3   senna-docusate (SENOKOT-S) 8.6-50 MG tablet, Take 2 tablets by mouth at bedtime., Disp: 60 tablet, Rfl: 1 [2]  Social History Tobacco Use  Smoking Status Every Day   Current packs/day: 0.00   Average packs/day: 1.5 packs/day for 24.0 years (36.0 ttl pk-yrs)   Types: Cigarettes   Last attempt to quit: 08/21/2023   Years since quitting: 1.3   Passive exposure: Past  Smokeless Tobacco Former  Tobacco Comments   Former smoker 07/17/24

## 2024-12-23 NOTE — Progress Notes (Signed)
 Discharge Progress Report  Patient Details  Name: Kyle Matthews MRN: [id number] Date of Birth: 30-May-1946 Referring Provider:   Flowsheet Row CARDIAC REHAB PHASE II ORIENTATION from 11/20/2024 in [facility] CARDIAC REHABILITATION  Referring Provider Wendel Haws MD     Number of Visits: 5  Reason for Discharge:  Early Exit:  Asked to be discharged stating he is exercising at home  Smoking History:  Tobacco Use History[1]  Diagnosis:  S/P CABG x 2  S/P tricuspid valve replacement  S/P AVR (aortic valve replacement)  ADL UCSD:   Initial Exercise Prescription:  Initial Exercise Prescription - 11/20/24 1400       Date of Initial Exercise RX and Referring Provider   Date 11/20/24    Referring Provider Wendel Haws MD      Treadmill   MPH 1    Grade 0    Minutes 15    METs 1.77      NuStep   Level 1    SPM 50    Minutes 15    METs 1.8      Prescription Details   Frequency (times per week) 2    Duration Progress to 30 minutes of continuous aerobic without signs/symptoms of physical distress      Intensity   THRR 40-80% of Max Heartrate 102-130    Ratings of Perceived Exertion 11-13    Perceived Dyspnea 0-4      Resistance Training   Training Prescription Yes    Weight 3    Reps 10-15          Discharge Exercise Prescription (Final Exercise Prescription Changes):  Exercise Prescription Changes - 12/04/24 1500       Response to Exercise   Blood Pressure (Admit) 122/68    Blood Pressure (Exercise) 102/60    Blood Pressure (Exit) 122/62    Heart Rate (Admit) 75 bpm    Heart Rate (Exercise) 104 bpm    Heart Rate (Exit) 95 bpm    Rating of Perceived Exertion (Exercise) 12    Duration Continue with 30 min of aerobic exercise without signs/symptoms of physical distress.    Intensity THRR unchanged      Progression   Progression Continue to progress workloads to maintain intensity without signs/symptoms of physical distress.      Resistance Training    Weight 3    Reps 10-15      Treadmill   MPH 1.3    Grade 0    Minutes 15    METs 1.99      NuStep   Level 1    SPM 82    Minutes 15    METs 2          Functional Capacity:  6 Minute Walk     Row Name 11/20/24 1421         6 Minute Walk   Phase Initial     Distance 1250 feet     Walk Time 6 minutes     # of Rest Breaks 0     MPH 2.36     METS 2.33     RPE 14     Perceived Dyspnea  0     VO2 Peak 8.17     Symptoms No     Resting HR 74 bpm     Resting BP 110/62     Resting Oxygen Saturation  98 %     Exercise Oxygen Saturation  during 6 min walk 98 %  Max Ex. HR 109 bpm     Max Ex. BP 120/60     2 Minute Post BP 112/60        Psychological, QOL, Others - Outcomes: PHQ 2/9:    11/20/2024    2:00 PM 02/12/2024    3:40 PM  Depression screen PHQ 2/9  Decreased Interest 0 1  Down, Depressed, Hopeless 0 2  PHQ - 2 Score 0 3  Altered sleeping 1 2  Tired, decreased energy 3 3  Change in appetite 0 0  Feeling bad or failure about yourself  0 2  Trouble concentrating 0 1  Moving slowly or fidgety/restless 0 0  Suicidal thoughts 0 0  PHQ-9 Score 4 11   Difficult doing work/chores Somewhat difficult Somewhat difficult     Data saved with a previous flowsheet row definition    Quality of Life:  Quality of Life - 11/20/24 1425       Quality of Life   Select Quality of Life      Quality of Life Scores   Health/Function Pre 11.23 %    Socioeconomic Pre 28.29 %    Psych/Spiritual Pre 22.79 %    Family Pre 27.6 %    GLOBAL Pre 19.53 %          Personal Goals: Goals established at orientation with interventions provided to work toward goal.  Personal Goals and Risk Factors at Admission - 11/20/24 1352       Core Components/Risk Factors/Patient Goals on Admission    Weight Management Weight Maintenance    Heart Failure Yes    Intervention Provide a combined exercise and nutrition program that is supplemented with education, support and  counseling about heart failure. Directed toward relieving symptoms such as shortness of breath, decreased exercise tolerance, and extremity edema.    Expected Outcomes Improve functional capacity of life;Short term: Attendance in program 2-3 days a week with increased exercise capacity. Reported lower sodium intake. Reported increased fruit and vegetable intake. Reports medication compliance.;Short term: Daily weights obtained and reported for increase. Utilizing diuretic protocols set by physician.;Long term: Adoption of self-care skills and reduction of barriers for early signs and symptoms recognition and intervention leading to self-care maintenance.    Hypertension Yes    Intervention Provide education on lifestyle modifcations including regular physical activity/exercise, weight management, moderate sodium restriction and increased consumption of fresh fruit, vegetables, and low fat dairy, alcohol moderation, and smoking cessation.;Monitor prescription use compliance.    Expected Outcomes Short Term: Continued assessment and intervention until BP is < 140/31mm HG in hypertensive participants. < 130/28mm HG in hypertensive participants with diabetes, heart failure or chronic kidney disease.;Long Term: Maintenance of blood pressure at goal levels.    Lipids Yes    Intervention Provide education and support for participant on nutrition & aerobic/resistive exercise along with prescribed medications to achieve LDL 70mg , HDL >40mg .    Expected Outcomes Short Term: Participant states understanding of desired cholesterol values and is compliant with medications prescribed. Participant is following exercise prescription and nutrition guidelines.;Long Term: Cholesterol controlled with medications as prescribed, with individualized exercise RX and with personalized nutrition plan. Value goals: LDL < 70mg , HDL > 40 mg.           Personal Goals Discharge:   Exercise Goals and Review:  Exercise Goals      Row Name 11/20/24 1424             Exercise Goals   Increase Physical Activity Yes  Intervention Provide advice, education, support and counseling about physical activity/exercise needs.;Develop an individualized exercise prescription for aerobic and resistive training based on initial evaluation findings, risk stratification, comorbidities and participant's personal goals.       Expected Outcomes Short Term: Attend rehab on a regular basis to increase amount of physical activity.;Long Term: Add in home exercise to make exercise part of routine and to increase amount of physical activity.;Long Term: Exercising regularly at least 3-5 days a week.       Increase Strength and Stamina Yes       Intervention Provide advice, education, support and counseling about physical activity/exercise needs.;Develop an individualized exercise prescription for aerobic and resistive training based on initial evaluation findings, risk stratification, comorbidities and participant's personal goals.       Expected Outcomes Short Term: Increase workloads from initial exercise prescription for resistance, speed, and METs.;Short Term: Perform resistance training exercises routinely during rehab and add in resistance training at home;Long Term: Improve cardiorespiratory fitness, muscular endurance and strength as measured by increased METs and functional capacity ( )       Able to understand and use rate of perceived exertion (RPE) scale Yes       Intervention Provide education and explanation on how to use RPE scale       Expected Outcomes Short Term: Able to use RPE daily in rehab to express subjective intensity level;Long Term:  Able to use RPE to guide intensity level when exercising independently       Able to understand and use Dyspnea scale Yes       Intervention Provide education and explanation on how to use Dyspnea scale       Expected Outcomes Short Term: Able to use Dyspnea scale daily in rehab to  express subjective sense of shortness of breath during exertion;Long Term: Able to use Dyspnea scale to guide intensity level when exercising independently       Knowledge and understanding of Target Heart Rate Range (THRR) Yes       Intervention Provide education and explanation of THRR including how the numbers were predicted and where they are located for reference       Expected Outcomes Short Term: Able to state/look up THRR;Long Term: Able to use THRR to govern intensity when exercising independently;Short Term: Able to use daily as guideline for intensity in rehab       Able to check pulse independently Yes       Intervention Provide education and demonstration on how to check pulse in carotid and radial arteries.;Review the importance of being able to check your own pulse for safety during independent exercise       Expected Outcomes Short Term: Able to explain why pulse checking is important during independent exercise;Long Term: Able to check pulse independently and accurately       Understanding of Exercise Prescription Yes       Intervention Provide education, explanation, and written materials on patient's individual exercise prescription       Expected Outcomes Short Term: Able to explain program exercise prescription;Long Term: Able to explain home exercise prescription to exercise independently          Exercise Goals Re-Evaluation:  Exercise Goals Re-Evaluation     Row Name 12/02/24 1059 12/03/24 0907           Exercise Goal Re-Evaluation   Exercise Goals Review Increase Strength and Stamina;Increase Physical Activity;Able to understand and use Dyspnea scale;Able to understand and use rate of perceived  exertion (RPE) scale;Knowledge and understanding of Target Heart Rate Range (THRR);Able to check pulse independently;Understanding of Exercise Prescription Increase Physical Activity;Increase Strength and Stamina;Understanding of Exercise Prescription      Comments Reviewed RPE  and dyspnea scale, THR and program prescription with pt today.  Pt voiced understanding and was given a copy of goals to take home. Orrie completed his firest session of CR. HE did well with the nustep anmd treadmill. Will continue to monitor and progress as able      Expected Outcomes Short: Use RPE daily to regulate intensity.  Long: Follow program prescription in THR. Short: continue to attned rehab   long: add exercise outsode rehab         Nutrition & Weight - Outcomes:  Pre Biometrics - 11/20/24 1424       Pre Biometrics   Height 5' 3 (1.6 m)    Weight 163 lb 2.3 oz (74 kg)    Waist Circumference 38 inches    Hip Circumference 41 inches    Waist to Hip Ratio 0.93 %    BMI (Calculated) 28.91    Grip Strength 22.1 kg    Single Leg Stand 5.46 seconds           Nutrition:   Nutrition Discharge:  Nutrition Assessments - 12/04/24 1029       Rate Your Plate Scores   Pre Score 55          Education Questionnaire Score:  Knowledge Questionnaire Score - 12/04/24 1031       Knowledge Questionnaire Score   Pre Score 24/26          Goals reviewed with patient; copy given to patient.    [1]  Social History Tobacco Use  Smoking Status Every Day   Current packs/day: 0.00   Average packs/day: 1.5 packs/day for 24.0 years (36.0 ttl pk-yrs)   Types: Cigarettes   Last attempt to quit: 08/21/2023   Years since quitting: 1.3   Passive exposure: Past  Smokeless Tobacco Former  Tobacco Comments   Former smoker 07/17/24

## 2024-12-25 ENCOUNTER — Encounter (HOSPITAL_COMMUNITY)

## 2024-12-30 ENCOUNTER — Encounter (HOSPITAL_COMMUNITY)

## 2024-12-30 ENCOUNTER — Other Ambulatory Visit: Payer: Self-pay | Admitting: Surgery

## 2024-12-30 DIAGNOSIS — Z09 Encounter for follow-up examination after completed treatment for conditions other than malignant neoplasm: Secondary | ICD-10-CM

## 2025-01-01 ENCOUNTER — Encounter (HOSPITAL_COMMUNITY)

## 2025-01-06 ENCOUNTER — Encounter (HOSPITAL_COMMUNITY)

## 2025-01-08 ENCOUNTER — Encounter (HOSPITAL_COMMUNITY)

## 2025-01-13 ENCOUNTER — Encounter (HOSPITAL_COMMUNITY)

## 2025-01-15 ENCOUNTER — Encounter (HOSPITAL_COMMUNITY)

## 2025-01-20 ENCOUNTER — Encounter (HOSPITAL_COMMUNITY)

## 2025-01-22 ENCOUNTER — Ambulatory Visit (HOSPITAL_COMMUNITY)
Admission: RE | Admit: 2025-01-22 | Discharge: 2025-01-22 | Disposition: A | Source: Ambulatory Visit | Attending: Surgery | Admitting: Surgery

## 2025-01-22 ENCOUNTER — Encounter (HOSPITAL_COMMUNITY)

## 2025-01-22 DIAGNOSIS — Z09 Encounter for follow-up examination after completed treatment for conditions other than malignant neoplasm: Secondary | ICD-10-CM

## 2025-01-22 MED ORDER — IOHEXOL 350 MG/ML SOLN
75.0000 mL | Freq: Once | INTRAVENOUS | Status: AC | PRN
  Administered 2025-01-22: 75 mL via INTRAVENOUS

## 2025-01-27 ENCOUNTER — Encounter (HOSPITAL_COMMUNITY)

## 2025-01-29 ENCOUNTER — Encounter (HOSPITAL_COMMUNITY)

## 2025-02-03 ENCOUNTER — Encounter (HOSPITAL_COMMUNITY)

## 2025-02-05 ENCOUNTER — Ambulatory Visit: Admitting: Surgery

## 2025-02-05 ENCOUNTER — Encounter (HOSPITAL_COMMUNITY)

## 2025-02-10 ENCOUNTER — Encounter (HOSPITAL_COMMUNITY)

## 2025-02-12 ENCOUNTER — Encounter (HOSPITAL_COMMUNITY)

## 2025-02-17 ENCOUNTER — Encounter

## 2025-02-17 ENCOUNTER — Encounter (HOSPITAL_COMMUNITY)

## 2025-02-19 ENCOUNTER — Encounter (HOSPITAL_COMMUNITY)

## 2025-02-24 ENCOUNTER — Encounter (HOSPITAL_COMMUNITY)

## 2025-02-26 ENCOUNTER — Encounter (HOSPITAL_COMMUNITY)

## 2025-03-03 ENCOUNTER — Encounter (HOSPITAL_COMMUNITY)

## 2025-03-05 ENCOUNTER — Encounter (HOSPITAL_COMMUNITY)

## 2025-03-10 ENCOUNTER — Encounter (HOSPITAL_COMMUNITY)

## 2025-03-12 ENCOUNTER — Encounter (HOSPITAL_COMMUNITY)

## 2025-03-17 ENCOUNTER — Encounter (HOSPITAL_COMMUNITY)

## 2025-03-19 ENCOUNTER — Encounter (HOSPITAL_COMMUNITY)

## 2025-03-24 ENCOUNTER — Encounter (HOSPITAL_COMMUNITY)

## 2025-05-19 ENCOUNTER — Encounter

## 2025-08-18 ENCOUNTER — Encounter
